# Patient Record
Sex: Male | Born: 1945 | Race: Black or African American | Hispanic: No | Marital: Married | Smoking: Former smoker
Health system: Southern US, Community
[De-identification: ages and names within clinical notes are randomized; demographics above are authoritative.]

## PROBLEM LIST (undated history)

## (undated) DIAGNOSIS — K5790 Diverticulosis of intestine, part unspecified, without perforation or abscess without bleeding: Secondary | ICD-10-CM

## (undated) DIAGNOSIS — N419 Inflammatory disease of prostate, unspecified: Secondary | ICD-10-CM

## (undated) DIAGNOSIS — R001 Bradycardia, unspecified: Secondary | ICD-10-CM

## (undated) DIAGNOSIS — I5020 Unspecified systolic (congestive) heart failure: Secondary | ICD-10-CM

## (undated) DIAGNOSIS — N400 Enlarged prostate without lower urinary tract symptoms: Secondary | ICD-10-CM

## (undated) DIAGNOSIS — M12349 Palindromic rheumatism, unspecified hand: Secondary | ICD-10-CM

## (undated) DIAGNOSIS — Z7982 Long term (current) use of aspirin: Secondary | ICD-10-CM

## (undated) DIAGNOSIS — N423 Unspecified dysplasia of prostate: Secondary | ICD-10-CM

## (undated) DIAGNOSIS — N183 Chronic kidney disease, stage 3 unspecified: Secondary | ICD-10-CM

## (undated) DIAGNOSIS — G894 Chronic pain syndrome: Secondary | ICD-10-CM

## (undated) DIAGNOSIS — D509 Iron deficiency anemia, unspecified: Secondary | ICD-10-CM

## (undated) DIAGNOSIS — G4733 Obstructive sleep apnea (adult) (pediatric): Secondary | ICD-10-CM

## (undated) DIAGNOSIS — D126 Benign neoplasm of colon, unspecified: Secondary | ICD-10-CM

## (undated) DIAGNOSIS — I517 Cardiomegaly: Secondary | ICD-10-CM

## (undated) DIAGNOSIS — E039 Hypothyroidism, unspecified: Secondary | ICD-10-CM

## (undated) DIAGNOSIS — F1190 Opioid use, unspecified, uncomplicated: Secondary | ICD-10-CM

## (undated) DIAGNOSIS — N189 Chronic kidney disease, unspecified: Secondary | ICD-10-CM

## (undated) DIAGNOSIS — I2510 Atherosclerotic heart disease of native coronary artery without angina pectoris: Secondary | ICD-10-CM

## (undated) DIAGNOSIS — E785 Hyperlipidemia, unspecified: Secondary | ICD-10-CM

## (undated) DIAGNOSIS — D649 Anemia, unspecified: Secondary | ICD-10-CM

## (undated) DIAGNOSIS — E119 Type 2 diabetes mellitus without complications: Secondary | ICD-10-CM

## (undated) DIAGNOSIS — I5189 Other ill-defined heart diseases: Secondary | ICD-10-CM

## (undated) DIAGNOSIS — K6389 Other specified diseases of intestine: Secondary | ICD-10-CM

## (undated) DIAGNOSIS — E291 Testicular hypofunction: Secondary | ICD-10-CM

## (undated) DIAGNOSIS — I428 Other cardiomyopathies: Secondary | ICD-10-CM

## (undated) DIAGNOSIS — R296 Repeated falls: Secondary | ICD-10-CM

## (undated) DIAGNOSIS — R918 Other nonspecific abnormal finding of lung field: Secondary | ICD-10-CM

## (undated) DIAGNOSIS — E663 Overweight: Secondary | ICD-10-CM

## (undated) DIAGNOSIS — Z5189 Encounter for other specified aftercare: Secondary | ICD-10-CM

## (undated) DIAGNOSIS — I700 Atherosclerosis of aorta: Secondary | ICD-10-CM

## (undated) DIAGNOSIS — I447 Left bundle-branch block, unspecified: Secondary | ICD-10-CM

## (undated) DIAGNOSIS — K6289 Other specified diseases of anus and rectum: Secondary | ICD-10-CM

## (undated) DIAGNOSIS — I5022 Chronic systolic (congestive) heart failure: Secondary | ICD-10-CM

## (undated) DIAGNOSIS — I10 Essential (primary) hypertension: Secondary | ICD-10-CM

## (undated) DIAGNOSIS — N410 Acute prostatitis: Secondary | ICD-10-CM

## (undated) DIAGNOSIS — N509 Disorder of male genital organs, unspecified: Secondary | ICD-10-CM

## (undated) DIAGNOSIS — K439 Ventral hernia without obstruction or gangrene: Secondary | ICD-10-CM

## (undated) DIAGNOSIS — M5030 Other cervical disc degeneration, unspecified cervical region: Secondary | ICD-10-CM

## (undated) DIAGNOSIS — R7989 Other specified abnormal findings of blood chemistry: Secondary | ICD-10-CM

## (undated) DIAGNOSIS — I509 Heart failure, unspecified: Secondary | ICD-10-CM

## (undated) DIAGNOSIS — M48061 Spinal stenosis, lumbar region without neurogenic claudication: Secondary | ICD-10-CM

## (undated) DIAGNOSIS — I3139 Other pericardial effusion (noninflammatory): Secondary | ICD-10-CM

## (undated) DIAGNOSIS — C9110 Chronic lymphocytic leukemia of B-cell type not having achieved remission: Secondary | ICD-10-CM

## (undated) DIAGNOSIS — N453 Epididymo-orchitis: Secondary | ICD-10-CM

## (undated) DIAGNOSIS — D3502 Benign neoplasm of left adrenal gland: Secondary | ICD-10-CM

## (undated) DIAGNOSIS — I340 Nonrheumatic mitral (valve) insufficiency: Secondary | ICD-10-CM

## (undated) DIAGNOSIS — N529 Male erectile dysfunction, unspecified: Secondary | ICD-10-CM

## (undated) DIAGNOSIS — R911 Solitary pulmonary nodule: Secondary | ICD-10-CM

## (undated) DIAGNOSIS — N50819 Testicular pain, unspecified: Secondary | ICD-10-CM

## (undated) DIAGNOSIS — I219 Acute myocardial infarction, unspecified: Secondary | ICD-10-CM

## (undated) DIAGNOSIS — N5089 Other specified disorders of the male genital organs: Secondary | ICD-10-CM

## (undated) HISTORY — DX: Disorder of male genital organs, unspecified: N50.9

## (undated) HISTORY — DX: Acute myocardial infarction, unspecified: I21.9

## (undated) HISTORY — DX: Testicular hypofunction: E29.1

## (undated) HISTORY — DX: Other specified diseases of anus and rectum: K62.89

## (undated) HISTORY — PX: BACK SURGERY: SHX140

## (undated) HISTORY — PX: OTHER SURGICAL HISTORY: SHX169

## (undated) HISTORY — DX: Other specified diseases of intestine: K63.89

## (undated) HISTORY — DX: Testicular pain, unspecified: N50.819

## (undated) HISTORY — DX: Inflammatory disease of prostate, unspecified: N41.9

## (undated) HISTORY — DX: Epididymo-orchitis: N45.3

## (undated) HISTORY — DX: Anemia, unspecified: D64.9

## (undated) HISTORY — DX: Overweight: E66.3

## (undated) HISTORY — DX: Other specified disorders of the male genital organs: N50.89

## (undated) HISTORY — DX: Other specified abnormal findings of blood chemistry: R79.89

## (undated) HISTORY — DX: Encounter for other specified aftercare: Z51.89

## (undated) HISTORY — DX: Male erectile dysfunction, unspecified: N52.9

## (undated) HISTORY — DX: Acute prostatitis: N41.0

## (undated) HISTORY — DX: Palindromic rheumatism, unspecified hand: M12.349

## (undated) HISTORY — DX: Benign prostatic hyperplasia without lower urinary tract symptoms: N40.0

## (undated) HISTORY — DX: Essential (primary) hypertension: I10

## (undated) HISTORY — DX: Type 2 diabetes mellitus without complications: E11.9

## (undated) HISTORY — PX: TONSILLECTOMY: SUR1361

## (undated) HISTORY — DX: Chronic lymphocytic leukemia of B-cell type not having achieved remission: C91.10

## (undated) HISTORY — DX: Chronic kidney disease, unspecified: N18.9

## (undated) HISTORY — PX: COLON SURGERY: SHX602

## (undated) HISTORY — DX: Atherosclerotic heart disease of native coronary artery without angina pectoris: I25.10

## (undated) HISTORY — DX: Unspecified dysplasia of prostate: N42.30

---

## 2003-01-21 ENCOUNTER — Other Ambulatory Visit: Payer: Self-pay

## 2004-01-10 ENCOUNTER — Emergency Department: Payer: Self-pay | Admitting: Emergency Medicine

## 2004-06-06 ENCOUNTER — Other Ambulatory Visit: Payer: Self-pay

## 2004-06-06 ENCOUNTER — Emergency Department: Payer: Self-pay | Admitting: Emergency Medicine

## 2004-12-17 ENCOUNTER — Emergency Department: Payer: Self-pay | Admitting: General Practice

## 2004-12-17 ENCOUNTER — Ambulatory Visit: Payer: Self-pay | Admitting: General Practice

## 2005-07-01 ENCOUNTER — Ambulatory Visit: Payer: Self-pay | Admitting: Gastroenterology

## 2005-07-26 ENCOUNTER — Emergency Department: Payer: Self-pay | Admitting: Emergency Medicine

## 2006-02-09 ENCOUNTER — Ambulatory Visit: Payer: Self-pay | Admitting: Nurse Practitioner

## 2006-06-03 ENCOUNTER — Inpatient Hospital Stay: Payer: Self-pay | Admitting: Vascular Surgery

## 2006-11-28 ENCOUNTER — Emergency Department: Payer: Self-pay | Admitting: Emergency Medicine

## 2008-02-23 ENCOUNTER — Emergency Department: Payer: Self-pay | Admitting: Unknown Physician Specialty

## 2008-12-23 ENCOUNTER — Emergency Department: Payer: Self-pay | Admitting: Emergency Medicine

## 2009-04-10 ENCOUNTER — Encounter: Payer: Self-pay | Admitting: Family Medicine

## 2009-04-22 ENCOUNTER — Emergency Department: Payer: Self-pay | Admitting: Unknown Physician Specialty

## 2009-04-25 ENCOUNTER — Encounter: Payer: Self-pay | Admitting: Family Medicine

## 2009-09-17 ENCOUNTER — Inpatient Hospital Stay: Payer: Self-pay | Admitting: Internal Medicine

## 2009-09-22 ENCOUNTER — Ambulatory Visit: Payer: Self-pay | Admitting: Cardiovascular Disease

## 2011-05-02 ENCOUNTER — Emergency Department: Payer: Self-pay | Admitting: Emergency Medicine

## 2011-06-30 ENCOUNTER — Emergency Department: Payer: Self-pay | Admitting: Emergency Medicine

## 2011-06-30 LAB — COMPREHENSIVE METABOLIC PANEL
Albumin: 3.4 g/dL (ref 3.4–5.0)
Alkaline Phosphatase: 65 U/L (ref 50–136)
Anion Gap: 9 (ref 7–16)
Bilirubin,Total: 0.6 mg/dL (ref 0.2–1.0)
Calcium, Total: 8.4 mg/dL — ABNORMAL LOW (ref 8.5–10.1)
Chloride: 108 mmol/L — ABNORMAL HIGH (ref 98–107)
Co2: 27 mmol/L (ref 21–32)
Creatinine: 1.11 mg/dL (ref 0.60–1.30)
Osmolality: 289 (ref 275–301)
SGOT(AST): 36 U/L (ref 15–37)
SGPT (ALT): 26 U/L
Sodium: 144 mmol/L (ref 136–145)

## 2011-06-30 LAB — URINALYSIS, COMPLETE
Bacteria: NONE SEEN
Bilirubin,UR: NEGATIVE
Blood: NEGATIVE
Glucose,UR: NEGATIVE mg/dL (ref 0–75)
Ketone: NEGATIVE
Ph: 5 (ref 4.5–8.0)
RBC,UR: NONE SEEN /HPF (ref 0–5)
Squamous Epithelial: <1

## 2011-06-30 LAB — LIPASE, BLOOD: Lipase: 236 U/L (ref 73–393)

## 2011-06-30 LAB — CBC
MCH: 29.6 pg (ref 26.0–34.0)
Platelet: 185 10*3/uL (ref 150–440)
RBC: 3.98 10*6/uL — ABNORMAL LOW (ref 4.40–5.90)

## 2011-09-27 ENCOUNTER — Emergency Department: Payer: Self-pay | Admitting: Emergency Medicine

## 2011-09-27 LAB — COMPREHENSIVE METABOLIC PANEL WITH GFR
Albumin: 3.5 g/dL (ref 3.4–5.0)
Alkaline Phosphatase: 75 U/L (ref 50–136)
Anion Gap: 9 (ref 7–16)
BUN: 18 mg/dL (ref 7–18)
Bilirubin,Total: 1.5 mg/dL — ABNORMAL HIGH (ref 0.2–1.0)
Calcium, Total: 8.9 mg/dL (ref 8.5–10.1)
Chloride: 106 mmol/L (ref 98–107)
Co2: 22 mmol/L (ref 21–32)
Creatinine: 1.14 mg/dL (ref 0.60–1.30)
EGFR (African American): >60
EGFR (Non-African Amer.): >60
Glucose: 182 mg/dL — ABNORMAL HIGH (ref 65–99)
Osmolality: 280 (ref 275–301)
Potassium: 3.4 mmol/L — ABNORMAL LOW (ref 3.5–5.1)
SGOT(AST): 29 U/L (ref 15–37)
SGPT (ALT): 23 U/L (ref 12–78)
Sodium: 137 mmol/L (ref 136–145)
Total Protein: 7.7 g/dL (ref 6.4–8.2)

## 2011-09-27 LAB — CSF CELL COUNT WITH DIFFERENTIAL
Eosinophil: 0 %
Lymphocytes: 8 %
Monocytes/Macrophages: 92 %
Neutrophils: 0 %
Other Cells: 0 %
WBC (CSF): 7 /mm3

## 2011-09-27 LAB — CBC WITH DIFFERENTIAL/PLATELET
Basophil #: 0 10*3/uL (ref 0.0–0.1)
Basophil %: 0.3 %
Eosinophil %: 0.1 %
HCT: 37.5 % — ABNORMAL LOW (ref 40.0–52.0)
Lymphocyte #: 0.7 10*3/uL — ABNORMAL LOW (ref 1.0–3.6)
Lymphocyte %: 4.9 %
MCH: 29.1 pg (ref 26.0–34.0)
MCV: 88 fL (ref 80–100)
Monocyte #: 1.1 x10 3/mm — ABNORMAL HIGH (ref 0.2–1.0)
Monocyte %: 7.7 %
Neutrophil #: 11.9 10*3/uL — ABNORMAL HIGH (ref 1.4–6.5)
Platelet: 166 10*3/uL (ref 150–440)
RDW: 15 % — ABNORMAL HIGH (ref 11.5–14.5)
WBC: 13.1 10*3/uL — ABNORMAL HIGH (ref 3.8–10.6)

## 2011-09-27 LAB — TSH: Thyroid Stimulating Horm: 7.19 u[IU]/mL — ABNORMAL HIGH

## 2011-09-27 LAB — PROTEIN, CSF: Protein, CSF: 53 mg/dL — ABNORMAL HIGH (ref 15–45)

## 2011-09-27 LAB — GLUCOSE, CSF: Glucose, CSF: 93 mg/dL — ABNORMAL HIGH (ref 40–75)

## 2011-09-28 LAB — URINE CULTURE

## 2011-10-02 LAB — CULTURE, BLOOD (SINGLE)

## 2012-02-25 ENCOUNTER — Emergency Department: Payer: Self-pay | Admitting: Emergency Medicine

## 2012-02-25 LAB — COMPREHENSIVE METABOLIC PANEL
Albumin: 4.4 g/dL (ref 3.4–5.0)
Calcium, Total: 9.3 mg/dL (ref 8.5–10.1)
Co2: 32 mmol/L (ref 21–32)
EGFR (African American): >60
EGFR (Non-African Amer.): >60
Potassium: 4.2 mmol/L (ref 3.5–5.1)
SGOT(AST): 46 U/L — ABNORMAL HIGH (ref 15–37)
Sodium: 141 mmol/L (ref 136–145)
Total Protein: 8.3 g/dL — ABNORMAL HIGH (ref 6.4–8.2)

## 2012-02-25 LAB — CBC
HCT: 39.8 % — ABNORMAL LOW (ref 40.0–52.0)
HGB: 13.2 g/dL (ref 13.0–18.0)
MCH: 29.9 pg (ref 26.0–34.0)
RBC: 4.42 10*6/uL (ref 4.40–5.90)
WBC: 5.7 10*3/uL (ref 3.8–10.6)

## 2012-06-30 ENCOUNTER — Emergency Department: Payer: Self-pay | Admitting: Emergency Medicine

## 2012-08-14 ENCOUNTER — Ambulatory Visit: Payer: Self-pay | Admitting: Internal Medicine

## 2013-03-14 ENCOUNTER — Ambulatory Visit: Payer: Self-pay | Admitting: Specialist

## 2013-06-16 ENCOUNTER — Inpatient Hospital Stay: Payer: Self-pay | Admitting: Neurology

## 2013-06-16 DIAGNOSIS — N453 Epididymo-orchitis: Secondary | ICD-10-CM

## 2013-06-16 HISTORY — DX: Epididymo-orchitis: N45.3

## 2013-06-16 LAB — URINALYSIS, COMPLETE
Bacteria: NONE SEEN
Bilirubin,UR: NEGATIVE
Glucose,UR: NEGATIVE mg/dL (ref 0–75)
Ketone: NEGATIVE
LEUKOCYTE ESTERASE: NEGATIVE
Nitrite: NEGATIVE
Ph: 6 (ref 4.5–8.0)
Protein: NEGATIVE
RBC,UR: 5 /HPF (ref 0–5)
Specific Gravity: 1.019 (ref 1.003–1.030)
Squamous Epithelial: <1

## 2013-06-16 LAB — CBC WITH DIFFERENTIAL/PLATELET
Basophil #: 0 10*3/uL (ref 0.0–0.1)
Basophil %: 0.3 %
EOS ABS: 0 10*3/uL (ref 0.0–0.7)
Eosinophil %: 0.1 %
HCT: 40.9 % (ref 40.0–52.0)
HGB: 13.4 g/dL (ref 13.0–18.0)
Lymphocyte #: 1.4 10*3/uL (ref 1.0–3.6)
Lymphocyte %: 8.7 %
MCH: 29.8 pg (ref 26.0–34.0)
MCHC: 32.8 g/dL (ref 32.0–36.0)
MCV: 91 fL (ref 80–100)
Monocyte #: 0.9 x10 3/mm (ref 0.2–1.0)
Monocyte %: 5.7 %
Neutrophil #: 13.4 10*3/uL — ABNORMAL HIGH (ref 1.4–6.5)
Neutrophil %: 85.2 %
Platelet: 191 10*3/uL (ref 150–440)
RBC: 4.49 10*6/uL (ref 4.40–5.90)
RDW: 14.5 % (ref 11.5–14.5)
WBC: 15.7 10*3/uL — AB (ref 3.8–10.6)

## 2013-06-16 LAB — COMPREHENSIVE METABOLIC PANEL
ALBUMIN: 3.6 g/dL (ref 3.4–5.0)
ALT: 29 U/L (ref 12–78)
Alkaline Phosphatase: 74 U/L
Anion Gap: 4 — ABNORMAL LOW (ref 7–16)
BUN: 15 mg/dL (ref 7–18)
Bilirubin,Total: 1.5 mg/dL — ABNORMAL HIGH (ref 0.2–1.0)
CREATININE: 1.36 mg/dL — AB (ref 0.60–1.30)
Calcium, Total: 8.8 mg/dL (ref 8.5–10.1)
Chloride: 105 mmol/L (ref 98–107)
Co2: 28 mmol/L (ref 21–32)
EGFR (Non-African Amer.): 53 — ABNORMAL LOW
GLUCOSE: 118 mg/dL — AB (ref 65–99)
Osmolality: 276 (ref 275–301)
Potassium: 3.8 mmol/L (ref 3.5–5.1)
SGOT(AST): 40 U/L — ABNORMAL HIGH (ref 15–37)
SODIUM: 137 mmol/L (ref 136–145)
Total Protein: 7.6 g/dL (ref 6.4–8.2)

## 2013-06-16 LAB — CK: CK, Total: 599 U/L — ABNORMAL HIGH

## 2013-06-18 LAB — URINE CULTURE

## 2013-06-21 LAB — CULTURE, BLOOD (SINGLE)

## 2013-08-26 ENCOUNTER — Emergency Department: Payer: Self-pay | Admitting: Emergency Medicine

## 2013-08-31 ENCOUNTER — Ambulatory Visit: Payer: Self-pay | Admitting: Internal Medicine

## 2013-09-30 ENCOUNTER — Emergency Department: Payer: Self-pay | Admitting: Emergency Medicine

## 2013-10-06 ENCOUNTER — Emergency Department: Payer: Self-pay | Admitting: Emergency Medicine

## 2013-10-06 LAB — COMPREHENSIVE METABOLIC PANEL
ALBUMIN: 3.3 g/dL — AB (ref 3.4–5.0)
Alkaline Phosphatase: 70 U/L
Anion Gap: 9 (ref 7–16)
BILIRUBIN TOTAL: 0.8 mg/dL (ref 0.2–1.0)
BUN: 27 mg/dL — AB (ref 7–18)
CHLORIDE: 102 mmol/L (ref 98–107)
CREATININE: 1.37 mg/dL — AB (ref 0.60–1.30)
Calcium, Total: 9.1 mg/dL (ref 8.5–10.1)
Co2: 28 mmol/L (ref 21–32)
EGFR (African American): >60
GFR CALC NON AF AMER: 53 — AB
Glucose: 206 mg/dL — ABNORMAL HIGH (ref 65–99)
Osmolality: 289 (ref 275–301)
Potassium: 4.1 mmol/L (ref 3.5–5.1)
SGOT(AST): 32 U/L (ref 15–37)
SGPT (ALT): 22 U/L
SODIUM: 139 mmol/L (ref 136–145)
TOTAL PROTEIN: 7.9 g/dL (ref 6.4–8.2)

## 2013-10-06 LAB — CBC WITH DIFFERENTIAL/PLATELET
BASOS ABS: 0.1 10*3/uL (ref 0.0–0.1)
Basophil %: 1.1 %
EOS ABS: 0.4 10*3/uL (ref 0.0–0.7)
Eosinophil %: 6.2 %
HCT: 37.3 % — ABNORMAL LOW (ref 40.0–52.0)
HGB: 11.8 g/dL — AB (ref 13.0–18.0)
Lymphocyte #: 2.3 10*3/uL (ref 1.0–3.6)
Lymphocyte %: 35.9 %
MCH: 28.9 pg (ref 26.0–34.0)
MCHC: 31.5 g/dL — AB (ref 32.0–36.0)
MCV: 92 fL (ref 80–100)
MONOS PCT: 6.5 %
Monocyte #: 0.4 x10 3/mm (ref 0.2–1.0)
Neutrophil #: 3.3 10*3/uL (ref 1.4–6.5)
Neutrophil %: 50.3 %
PLATELETS: 295 10*3/uL (ref 150–440)
RBC: 4.08 10*6/uL — ABNORMAL LOW (ref 4.40–5.90)
RDW: 14.2 % (ref 11.5–14.5)
WBC: 6.5 10*3/uL (ref 3.8–10.6)

## 2013-10-09 ENCOUNTER — Emergency Department: Payer: Self-pay | Admitting: Emergency Medicine

## 2013-10-10 ENCOUNTER — Emergency Department: Payer: Self-pay | Admitting: Student

## 2014-01-10 ENCOUNTER — Ambulatory Visit: Payer: Self-pay | Admitting: Specialist

## 2014-01-14 ENCOUNTER — Ambulatory Visit: Payer: Self-pay | Admitting: Specialist

## 2014-01-22 ENCOUNTER — Emergency Department: Payer: Self-pay | Admitting: Emergency Medicine

## 2014-01-29 DIAGNOSIS — M4806 Spinal stenosis, lumbar region: Secondary | ICD-10-CM | POA: Diagnosis not present

## 2014-03-07 DIAGNOSIS — E663 Overweight: Secondary | ICD-10-CM | POA: Diagnosis not present

## 2014-03-07 DIAGNOSIS — N419 Inflammatory disease of prostate, unspecified: Secondary | ICD-10-CM | POA: Diagnosis not present

## 2014-03-07 DIAGNOSIS — K6289 Other specified diseases of anus and rectum: Secondary | ICD-10-CM | POA: Diagnosis not present

## 2014-03-07 DIAGNOSIS — N400 Enlarged prostate without lower urinary tract symptoms: Secondary | ICD-10-CM | POA: Diagnosis not present

## 2014-03-07 DIAGNOSIS — E291 Testicular hypofunction: Secondary | ICD-10-CM | POA: Diagnosis not present

## 2014-03-14 DIAGNOSIS — M25519 Pain in unspecified shoulder: Secondary | ICD-10-CM | POA: Diagnosis not present

## 2014-03-14 DIAGNOSIS — M7550 Bursitis of unspecified shoulder: Secondary | ICD-10-CM | POA: Diagnosis not present

## 2014-03-20 DIAGNOSIS — J329 Chronic sinusitis, unspecified: Secondary | ICD-10-CM | POA: Diagnosis not present

## 2014-03-20 DIAGNOSIS — R42 Dizziness and giddiness: Secondary | ICD-10-CM | POA: Diagnosis not present

## 2014-03-20 DIAGNOSIS — Z1329 Encounter for screening for other suspected endocrine disorder: Secondary | ICD-10-CM | POA: Diagnosis not present

## 2014-03-20 DIAGNOSIS — Z130 Encounter for screening for diseases of the blood and blood-forming organs and certain disorders involving the immune mechanism: Secondary | ICD-10-CM | POA: Diagnosis not present

## 2014-03-26 DIAGNOSIS — D649 Anemia, unspecified: Secondary | ICD-10-CM | POA: Diagnosis not present

## 2014-04-04 DIAGNOSIS — G4730 Sleep apnea, unspecified: Secondary | ICD-10-CM | POA: Insufficient documentation

## 2014-04-05 DIAGNOSIS — E663 Overweight: Secondary | ICD-10-CM | POA: Diagnosis not present

## 2014-04-05 DIAGNOSIS — N419 Inflammatory disease of prostate, unspecified: Secondary | ICD-10-CM | POA: Diagnosis not present

## 2014-04-05 DIAGNOSIS — N529 Male erectile dysfunction, unspecified: Secondary | ICD-10-CM | POA: Diagnosis not present

## 2014-04-05 DIAGNOSIS — E291 Testicular hypofunction: Secondary | ICD-10-CM | POA: Diagnosis not present

## 2014-04-05 DIAGNOSIS — N400 Enlarged prostate without lower urinary tract symptoms: Secondary | ICD-10-CM | POA: Diagnosis not present

## 2014-04-10 DIAGNOSIS — R1110 Vomiting, unspecified: Secondary | ICD-10-CM | POA: Diagnosis not present

## 2014-04-10 DIAGNOSIS — Z87891 Personal history of nicotine dependence: Secondary | ICD-10-CM | POA: Diagnosis not present

## 2014-04-10 DIAGNOSIS — Z1211 Encounter for screening for malignant neoplasm of colon: Secondary | ICD-10-CM | POA: Diagnosis not present

## 2014-04-10 DIAGNOSIS — I10 Essential (primary) hypertension: Secondary | ICD-10-CM | POA: Diagnosis not present

## 2014-04-10 DIAGNOSIS — R1013 Epigastric pain: Secondary | ICD-10-CM | POA: Diagnosis not present

## 2014-04-10 DIAGNOSIS — K5730 Diverticulosis of large intestine without perforation or abscess without bleeding: Secondary | ICD-10-CM | POA: Diagnosis not present

## 2014-04-10 DIAGNOSIS — D123 Benign neoplasm of transverse colon: Secondary | ICD-10-CM | POA: Diagnosis not present

## 2014-04-10 DIAGNOSIS — R9431 Abnormal electrocardiogram [ECG] [EKG]: Secondary | ICD-10-CM | POA: Diagnosis not present

## 2014-04-10 DIAGNOSIS — I440 Atrioventricular block, first degree: Secondary | ICD-10-CM | POA: Diagnosis not present

## 2014-04-10 DIAGNOSIS — E039 Hypothyroidism, unspecified: Secondary | ICD-10-CM | POA: Diagnosis not present

## 2014-04-10 DIAGNOSIS — J690 Pneumonitis due to inhalation of food and vomit: Secondary | ICD-10-CM | POA: Diagnosis not present

## 2014-04-10 DIAGNOSIS — Z79899 Other long term (current) drug therapy: Secondary | ICD-10-CM | POA: Diagnosis not present

## 2014-04-10 DIAGNOSIS — D122 Benign neoplasm of ascending colon: Secondary | ICD-10-CM | POA: Diagnosis not present

## 2014-04-10 DIAGNOSIS — I447 Left bundle-branch block, unspecified: Secondary | ICD-10-CM | POA: Diagnosis not present

## 2014-04-10 DIAGNOSIS — D649 Anemia, unspecified: Secondary | ICD-10-CM | POA: Diagnosis not present

## 2014-04-10 DIAGNOSIS — R0902 Hypoxemia: Secondary | ICD-10-CM | POA: Diagnosis not present

## 2014-04-10 DIAGNOSIS — E119 Type 2 diabetes mellitus without complications: Secondary | ICD-10-CM | POA: Diagnosis not present

## 2014-04-10 DIAGNOSIS — G8929 Other chronic pain: Secondary | ICD-10-CM | POA: Diagnosis not present

## 2014-04-10 DIAGNOSIS — K621 Rectal polyp: Secondary | ICD-10-CM | POA: Diagnosis not present

## 2014-04-10 DIAGNOSIS — R001 Bradycardia, unspecified: Secondary | ICD-10-CM | POA: Diagnosis not present

## 2014-04-10 DIAGNOSIS — M549 Dorsalgia, unspecified: Secondary | ICD-10-CM | POA: Diagnosis not present

## 2014-04-11 DIAGNOSIS — G4733 Obstructive sleep apnea (adult) (pediatric): Secondary | ICD-10-CM | POA: Diagnosis not present

## 2014-04-11 DIAGNOSIS — I440 Atrioventricular block, first degree: Secondary | ICD-10-CM | POA: Diagnosis not present

## 2014-04-11 DIAGNOSIS — Z79899 Other long term (current) drug therapy: Secondary | ICD-10-CM | POA: Diagnosis not present

## 2014-04-11 DIAGNOSIS — K621 Rectal polyp: Secondary | ICD-10-CM | POA: Diagnosis not present

## 2014-04-11 DIAGNOSIS — E039 Hypothyroidism, unspecified: Secondary | ICD-10-CM | POA: Diagnosis not present

## 2014-04-11 DIAGNOSIS — R9431 Abnormal electrocardiogram [ECG] [EKG]: Secondary | ICD-10-CM | POA: Diagnosis not present

## 2014-04-11 DIAGNOSIS — J690 Pneumonitis due to inhalation of food and vomit: Secondary | ICD-10-CM | POA: Diagnosis not present

## 2014-04-11 DIAGNOSIS — G8929 Other chronic pain: Secondary | ICD-10-CM | POA: Diagnosis not present

## 2014-04-11 DIAGNOSIS — I447 Left bundle-branch block, unspecified: Secondary | ICD-10-CM | POA: Diagnosis not present

## 2014-04-11 DIAGNOSIS — R1013 Epigastric pain: Secondary | ICD-10-CM | POA: Diagnosis not present

## 2014-04-11 DIAGNOSIS — M549 Dorsalgia, unspecified: Secondary | ICD-10-CM | POA: Diagnosis not present

## 2014-04-11 DIAGNOSIS — R0902 Hypoxemia: Secondary | ICD-10-CM | POA: Diagnosis not present

## 2014-04-11 DIAGNOSIS — D122 Benign neoplasm of ascending colon: Secondary | ICD-10-CM | POA: Diagnosis not present

## 2014-04-11 DIAGNOSIS — I10 Essential (primary) hypertension: Secondary | ICD-10-CM | POA: Diagnosis not present

## 2014-04-11 DIAGNOSIS — Z87891 Personal history of nicotine dependence: Secondary | ICD-10-CM | POA: Diagnosis not present

## 2014-04-11 DIAGNOSIS — R1110 Vomiting, unspecified: Secondary | ICD-10-CM | POA: Diagnosis not present

## 2014-04-11 DIAGNOSIS — Z1211 Encounter for screening for malignant neoplasm of colon: Secondary | ICD-10-CM | POA: Diagnosis not present

## 2014-04-11 DIAGNOSIS — K5730 Diverticulosis of large intestine without perforation or abscess without bleeding: Secondary | ICD-10-CM | POA: Diagnosis not present

## 2014-04-11 DIAGNOSIS — D649 Anemia, unspecified: Secondary | ICD-10-CM | POA: Diagnosis not present

## 2014-04-11 DIAGNOSIS — E119 Type 2 diabetes mellitus without complications: Secondary | ICD-10-CM | POA: Diagnosis not present

## 2014-05-10 DIAGNOSIS — S43429A Sprain of unspecified rotator cuff capsule, initial encounter: Secondary | ICD-10-CM | POA: Diagnosis not present

## 2014-05-10 DIAGNOSIS — M549 Dorsalgia, unspecified: Secondary | ICD-10-CM | POA: Diagnosis not present

## 2014-05-10 DIAGNOSIS — I10 Essential (primary) hypertension: Secondary | ICD-10-CM | POA: Diagnosis not present

## 2014-05-17 DIAGNOSIS — M7541 Impingement syndrome of right shoulder: Secondary | ICD-10-CM | POA: Diagnosis not present

## 2014-05-17 DIAGNOSIS — G5602 Carpal tunnel syndrome, left upper limb: Secondary | ICD-10-CM | POA: Diagnosis not present

## 2014-05-18 NOTE — Discharge Summary (Signed)
PATIENT NAME:  Paul Briggs, Paul Briggs MR#:  [id number] DATE OF BIRTH:  1945/06/13  DATE OF ADMISSION:  06/16/2013 DATE OF DISCHARGE:  06/19/2013  DISCHARGE DIAGNOSES:  1.  Sepsis present on admission, likely due to orchitis/epididymitis and left lower extremity cellulitis. 2.  Acute kidney failure.  3.  Hypertension.  4.  Possible tick bite injury.   SECONDARY DIAGNOSES: 1.  Diabetes.  2.  Hypertension.  3.  Hypothyroidism.   CONSULTATIONS: Infectious disease, Dr. Adrian Prows.   PROCEDURES AND RADIOLOGY: Ultrasound of the testicle on admission showed left-sided varicocele, bilateral orchitis and left epididymitis. Moderate bilateral hydrocele.   Left lower extremity Doppler on admission was negative.   LABORATORY DATA: UA on admission was negative.  Blood cultures x2 and urine culture was negative.   HISTORY AND SHORT HOSPITAL COURSE: The patient is a 69 year old male with above mentioned medical problems who was admitted for weakness and was found to have sepsis likely due to cellulitis and/or orchitis/epididymitis. Please Dr. Ardith Dark dictated history and physical for further details. The patient was continued on broad-spectrum antibiotic, but continued to spike fever for which infectious disease consultation was obtained. His fever curve was improving. After discussion with Dr. Adrian Prows from infectious disease, the patient was switched over to oral Augmentin and doxycycline, as the fever curve was improving and cellulitis improved. The patient was discharged home on the 26th of May in stable condition.   PERTINENT DISCHARGE PHYSICAL EXAMINATION: VITAL SIGNS: On the day of discharge, temperature was 98.4, heart rate 67 per minute, respirations 18 per minute, blood pressure 156/83 mmHg, and he was saturating 95% on room air.  CARDIOVASCULAR: S1, S2 normal. No murmurs, rubs, or gallop.  LUNGS: Clear to auscultation bilaterally. No wheezing, rales, rhonchi, or crepitation.   ABDOMEN: Soft, benign.  NEUROLOGIC: Nonfocal examination. EXTREMITIES: Left lower extremity cellulitis was cleared up. All of all physical examination remained at baseline.   DISCHARGE MEDICATIONS: 1.  Acetaminophen/oxycodone 325/5 mg 1 tablet p.o. every 6 hours as needed.  2.  Diazepam 2 mg p.o. 3 times a day as needed.  3.  Diphenhydramine 25 mg p.o. every 6 hours as needed.  4.  Amlodipine 10 mg p.o. daily. 5.  Doxycycline 100 mg p.o. b.i.d. for 5 days.  6.  Augmentin 875/125 mg 1 tablet p.o. b.i.d. for 10 days.   DISCHARGE DIET: Low sodium.   DISCHARGE ACTIVITY: As tolerated.   DISCHARGE INSTRUCTIONS AND FOLLOW-UP: The patient was instructed to follow up with his primary care physician, Dr. Delight Stare in 4 to 6 weeks, with [facility] urology in 2 to 4 weeks, and with Dr. Adrian Prows for infectious disease in 1 to 2 weeks. He was  instructed to elevate his legs.   TOTAL TIME DISCHARGING THIS PATIENT: 45 minutes.   ____________________________ Lucina Mellow. Manuella Ghazi, MD vss:sb D: 06/22/2013 10:25:25 ET T: 06/22/2013 10:51:59 ET JOB#: [id number]  cc: Lajoyce Tamura S. Manuella Ghazi, MD, <Dictator> Marguerita Merles, MD Cheral Marker. Ola Spurr, [facility] Mickal Meno [facility]un[facility] MD ELECTRONICALLY SIGNED 06/22/2013 11:15

## 2014-05-18 NOTE — H&P (Signed)
PATIENT NAME:  Paul Briggs, Paul Briggs MR#:  [id number] DATE OF BIRTH:  1945/06/10  DATE OF ADMISSION:  06/16/2013  REFERRING PHYSICIAN:  Dr. Jasmine December.   PRIMARY CARE PHYSICIAN:  Dr. Lennox Grumbles.   CHIEF COMPLAINT:  Weakness.  HISTORY OF PRESENT ILLNESS:  A 69 year old African American gentleman with history of diabetes, hypertension, hypothyroidism, presenting with weakness.  He is unfortunately not able to provide any further information given medical condition.  History obtained from wife at bedside.  She described a three day duration of progressively worsening fatigue, weakness which is generalized, originally diagnosed with prostatitis about three days ago, started on Cipro by PCP, then switched to doxycycline for unclear reasons, now having one day duration of somnolence, thus they decided to present to [facility] for further work-up and evaluation.  She states that he has been having subjective fevers and chills for the last 1 to 2 days.  She also denotes him having left lower extremity rash which is described mainly as redness, tender to palpation and increased warmth.  No further symptoms.  Once again, the patient is unable to provide any symptoms given current mental status and medical condition.   REVIEW OF SYSTEMS:  Unobtainable given the patient's mental status and medical condition.   PAST MEDICAL HISTORY:  Diabetes non-insulin-requiring, hypertension, hypothyroidism.   SOCIAL HISTORY:  Remote tobacco use.  Denies any alcohol or drug usage.   FAMILY HISTORY:  History of hypertension.   ALLERGIES:  No known drug allergies.   HOME MEDICATIONS:  Include temazepam 2 mg by mouth 3 times daily.   PHYSICAL EXAMINATION: VITAL SIGNS:  Temperature 103.2, heart rate 111, respirations 16, blood pressure 186/81, temperature 96% on room air.  Weight 108 kg, BMI of 30.6.  GENERAL:  Ill-appearing African American gentleman, is somnolent.  HEAD:  Normocephalic, atraumatic.  EYES:  Pupils equal,  round, reactive to light.  Extraocular muscles intact.  No scleral icterus.  MOUTH:  Dry mucosal membrane.  Dentition intact.  No abscess noted.  EAR, NOSE, THROAT:  Throat clear without exudates.  No external lesions.  NECK:  Supple.  No thyromegaly.  No nodules.  No JVD.  PULMONARY:  Clear to auscultation bilaterally without wheezes, rubs or rhonchi.  No use of accessory muscles.  Good respiratory effort.  CHEST:  Nontender to palpation.  CARDIOVASCULAR:  S1, S2, tachycardic.  No murmurs, rubs or gallops.  No edema.  Pedal pulses 2+ bilaterally.  GASTROINTESTINAL:  Soft, nontender, nondistended.  No masses.  Positive bowel sounds.  No hepatosplenomegaly.  MUSCULOSKELETAL:  The anterior aspect of the left tibia, area of erythema and warmth which is tender to palpation.  Otherwise, full range of motion.  No further swelling, clubbing, or edema.  NEUROLOGICAL:  Unable to fully assess given the patient's current mental status.  He is able to follow simple commands on occasion.  SKIN:  No ulcerations, lesions, rash or cyanosis other than the erythema as described above.  Skin warm and dry.  Turgor intact.  PSYCHIATRIC:  Unable to fully assess given the patient's current mental status and medical condition.  He is somnolent, though easily awakes to verbal stimuli and he is cooperative on examination, able to follow simple commands, though immediately falls back asleep when he is not being actively stimulated.   LABORATORY DATA:  Sodium 137, potassium 3.8, chloride 105, bicarb 28, BUN 15, creatinine 1.36, glucose 118.  LFTs:  Bilirubin 1.2, AST 40, otherwise within normal limits.  CK of 599.  WBC of 15.7,  hemoglobin 13.4, platelets of 191.  Urinalysis negative for evidence of infection.  He had a chest x-ray performed which reveals no acute cardiopulmonary process.  Testicular ultrasound revealing bilateral orchitis on the left with epididymitis as well as a left variceal.  Left lower extremity Doppler  performed revealing no evidence of DVT.   ASSESSMENT AND PLAN:  A 69 year old African American gentleman with history of diabetes, hypertension, presenting with weakness, originally diagnosed with prostatitis about three days ago by primary care physician, on Cipro, then switched to doxycycline, presenting with worsening of weakness and fatigue.  1.  Sepsis, meeting septic criteria by heart rate, leukocytosis and temperature, noted orchitis, epididymitis on work-up, but does have an area of erythema on the left lower extremity concerning for cellulitis.  Panculture including blood and urine.  Given his current temperature I suspect that he may actually be bacteremic.  We will culture blood and urine.  Antibiotic coverage with vancomycin and cefepime.  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  We will also continue the cefepime for broad gram-negative coverage.  Follow culture data and downgrade antibiotics when cultures return.  Continue with intravenous fluid hydration to keep mean arterial pressure greater than 65.  2.  Acute kidney injury.  Intravenous fluid hydration with normal saline.  Follow urine output, renal function.  3.  Diabetes.  Hold by mouth agents.  Add insulin sliding scale with q. 6 hour Accu-Cheks with goal blood glucose between 120 and 180 in critical care setting.  4.  Hypertension.  Add as needed hydralazine 10 mg intravenous q. 4 hours as needed for blood pressure greater than 299 or diastolic greater than 371.  5.  Venous thromboembolism prophylaxis with heparin subQ.  6.  CODE STATUS:  THE PATIENT IS FULL CODE.   TIME SPENT:  45 minutes.     ____________________________ Aaron Mose. Kirsta Probert, MD dkh:ea D: 06/16/2013 23:48:34 ET T: 06/17/2013 04:10:46 ET JOB#: [id number]  cc: Aaron Mose. Bing Duffey, MD, <Dictator> Sojourner Behringer Woodfin Ganja MD ELECTRONICALLY SIGNED 06/17/2013 20:31

## 2014-05-18 NOTE — Discharge Summary (Signed)
Dates of Admission and Diagnosis:  Date of Admission 16-Jun-2013   Date of Discharge 19-Jun-2013   Admitting Diagnosis sepsis   Final Diagnosis Sepsis: present on admission: likely due to orchitis/ epididymitis mild left lower extremity cellulitis.   Discharge Diagnosis 1 Acute kidney injury: likely prerenal.   2 Hypertension: improving.   3 possible tick bite: empiric doxy for 5 days    Chief Complaint/History of Present Illness weakness   Allergies:  No Known Allergies:   Hospital Course:  Condition on Discharge Good   Code Status:  Code Status Full Code   PHYSICAL EXAM ON DISCHARGE:  Physical Exam:  GEN no acute distress   HEENT pink conjunctivae   NECK supple  No masses   RESP normal resp effort  clear BS   CARD regular rate  no murmur   VASCULAR ACCESS -- Purulent drainage   ABD soft  normal BS   EXTR negative edema   SKIN No ulcers   NEURO follows commands   PSYCH alert, A+O to time, place, person   DISCHARGE INSTRUCTIONS HOME MEDS:  Medication Reconciliation: Patient's Home Medications at Discharge:     Medication Instructions  acetaminophen-oxycodone 325 mg-5 mg oral tablet  1 tab(s) orally every 6 hours, As Needed - for Pain   diphenhydramine 25 mg oral tablet  1 tab(s) orally every 6 hours, As Needed, itching, allergic reaction   amlodipine 10 mg oral tablet  1 tab(s) orally once a day   doxycycline hyclate 100 mg oral delayed release tablet  1 tab(s) orally 2 times a day x 5 days   amoxicillin-clavulanate 875 mg-125 mg oral tablet  1 tab(s) orally every 12 hours    PRESCRIPTIONS: ELECTRONICALLY SUBMITTED   Physician's Instructions:  Diet Low Sodium   Activity Limitations As tolerated   Return to Work Not Applicable   Time frame for Follow Up Appointment 2-4 weeks  [facility]   Time frame for Follow Up Appointment 4-6 weeks  dr miles   Time frame for Follow Up Appointment 1-2 weeks  dr fitzgerald   Other Comments  elevate legs especially in beds when sleeping.     Adrian Prows Patrick(Consultant): [facility], Internal Medicine, [address] [place], [place] [address], [place]   Delight Stare M(Family Physician): [facility], [address] [place], [place] [address], [place] [phone]  Electronic Signatures: Remer Macho (MD)  (Signed 28-May-15 10:06)  Authored: ADMISSION DATE AND DIAGNOSIS, CHIEF COMPLAINT/HPI, Allergies, HOSPITAL COURSE, PHYSICAL EXAM ON DISCHARGE, [place], PATIENT INSTRUCTIONS, Follow Up Physician   Last Updated: 28-May-15 10:06 by Remer Macho (MD)

## 2014-05-18 NOTE — Op Note (Signed)
PATIENT NAME:  Paul Briggs, Paul Briggs MR#:  [id number] DATE OF BIRTH:  04-Mar-1945  DATE OF PROCEDURE:  01/14/2014  PREOPERATIVE DIAGNOSES:   1.  Left carpal tunnel syndrome.  2.  Large mass in the palm volarly over the second and third metacarpal heads.   POSTOPERATIVE DIAGNOSES:   1.  Left carpal tunnel syndrome.  2.  Large mass in the palm volarly over the second and third metacarpal heads.   OPERATIONS:  1.  Left carpal tunnel release.  2.  Excision of large deep mass (3 x 3 cm) from the volar aspect of the left palm over the second and third metacarpal head.   SURGEON: Park Breed, MD.   ANESTHESIA: General LMA.   COMPLICATIONS: None.   DRAINS: None.   ESTIMATED BLOOD LOSS: None.   REPLACED: None.   OPERATIVE PROCEDURE: The patient was brought to the operating room where he underwent satisfactory general LMA anesthesia in the supine position. The left hand and arm were prepped and draped in sterile fashion. Esmarch was applied and the tourniquet inflated to 250 mmHg. Tourniquet time was 41 minutes. A longitudinal incision was made in the palm over the carpal tunnel and dissection carried out bluntly through subcutaneous tissue. The distal aspect of the volar carpal ligament was identified and a Kelly clamp passed beneath it to protect the nerve. The Mini-Blade knife was used to release the distal 80% of the volar retinacular ligament. The proximal 20% was released with the scissors. A Kelly clamp was used to spread to make sure it was completely released. The nerve was pale and flattened, it was freed up from adhesions with a mosquito clamp. Guyon canal was exposed and a clamp placed there while this was unroofed as well. The motor branch of the median nerve was intact. The adhesions were removed. The wound was irrigated and closed with running 4-0 nylon suture. Next an oblique angled incision was made over the palmar aspect of the second and third metacarpal heads starting distally  at the base of the index finger radially and extending ulnarly and then proximally down to the transverse flexion crease. Dissection was carried out through loupe magnification as it was in the first procedure. Upon opening the skin a large yellow fatty lipomatous mass was exposed. It was about 3 x 3 cm in size. Careful dissection was carried out around the mass completely. The neurovascular bundles to the ulnar side of the index finger and radial side of the middle finger were examined. There were followed back to the common digital nerve and the mass was excised off of this area carefully. Once the entire mass was removed the neurovascular bundles were seen to be intact. Some of the remaining fatty tissue was excised. The A1 pulley on the index finger showed significant scarring and this was excised. Range of motion was good. The wound was irrigated. The skin was closed with running 4-0 nylon suture. Both incisions were infiltrated with 0.5% Marcaine and a dry sterile compression hand dressing with volar splint was applied. The tourniquet was deflated with good return of blood flow to the hand. The patient was awakened and taken to recovery in good condition.      ____________________________ Park Breed, MD hem:bu D: 01/14/2014 15:22:55 ET T: 01/14/2014 16:25:51 ET JOB#: [id number]  cc: Park Breed, MD, <Dictator> Park Breed MD ELECTRONICALLY SIGNED 01/14/2014 20:41

## 2014-05-20 LAB — SURGICAL PATHOLOGY

## 2014-06-25 DIAGNOSIS — I10 Essential (primary) hypertension: Secondary | ICD-10-CM | POA: Diagnosis not present

## 2014-06-25 DIAGNOSIS — E1149 Type 2 diabetes mellitus with other diabetic neurological complication: Secondary | ICD-10-CM | POA: Diagnosis not present

## 2014-06-25 DIAGNOSIS — E039 Hypothyroidism, unspecified: Secondary | ICD-10-CM | POA: Diagnosis not present

## 2014-06-25 DIAGNOSIS — D649 Anemia, unspecified: Secondary | ICD-10-CM | POA: Diagnosis not present

## 2014-06-28 DIAGNOSIS — M4806 Spinal stenosis, lumbar region: Secondary | ICD-10-CM | POA: Diagnosis not present

## 2014-06-28 DIAGNOSIS — M5417 Radiculopathy, lumbosacral region: Secondary | ICD-10-CM | POA: Diagnosis not present

## 2014-07-25 DIAGNOSIS — M7550 Bursitis of unspecified shoulder: Secondary | ICD-10-CM | POA: Diagnosis not present

## 2014-07-25 DIAGNOSIS — Z0000 Encounter for general adult medical examination without abnormal findings: Secondary | ICD-10-CM | POA: Diagnosis not present

## 2014-07-25 DIAGNOSIS — S43429A Sprain of unspecified rotator cuff capsule, initial encounter: Secondary | ICD-10-CM | POA: Diagnosis not present

## 2014-07-25 DIAGNOSIS — E119 Type 2 diabetes mellitus without complications: Secondary | ICD-10-CM | POA: Diagnosis not present

## 2014-07-25 DIAGNOSIS — M5440 Lumbago with sciatica, unspecified side: Secondary | ICD-10-CM | POA: Diagnosis not present

## 2014-08-20 DIAGNOSIS — H16133 Photokeratitis, bilateral: Secondary | ICD-10-CM | POA: Diagnosis not present

## 2014-09-05 DIAGNOSIS — M4806 Spinal stenosis, lumbar region: Secondary | ICD-10-CM | POA: Diagnosis not present

## 2014-09-10 ENCOUNTER — Encounter: Payer: Self-pay | Admitting: *Deleted

## 2014-09-11 ENCOUNTER — Other Ambulatory Visit: Payer: Self-pay | Admitting: Orthopedic Surgery

## 2014-09-11 DIAGNOSIS — M48061 Spinal stenosis, lumbar region without neurogenic claudication: Secondary | ICD-10-CM

## 2014-09-16 ENCOUNTER — Ambulatory Visit
Admission: RE | Admit: 2014-09-16 | Discharge: 2014-09-16 | Disposition: A | Discharge status: Home / Self Care | Payer: Medicare Other | Source: Ambulatory Visit | Attending: Orthopedic Surgery | Admitting: Orthopedic Surgery

## 2014-09-16 DIAGNOSIS — M549 Dorsalgia, unspecified: Secondary | ICD-10-CM | POA: Diagnosis not present

## 2014-09-16 DIAGNOSIS — M4806 Spinal stenosis, lumbar region: Secondary | ICD-10-CM | POA: Insufficient documentation

## 2014-09-16 DIAGNOSIS — S43429A Sprain of unspecified rotator cuff capsule, initial encounter: Secondary | ICD-10-CM | POA: Diagnosis not present

## 2014-09-16 DIAGNOSIS — M47896 Other spondylosis, lumbar region: Secondary | ICD-10-CM | POA: Insufficient documentation

## 2014-09-16 DIAGNOSIS — M5127 Other intervertebral disc displacement, lumbosacral region: Secondary | ICD-10-CM | POA: Diagnosis not present

## 2014-09-16 DIAGNOSIS — M48061 Spinal stenosis, lumbar region without neurogenic claudication: Secondary | ICD-10-CM

## 2014-09-16 DIAGNOSIS — M5136 Other intervertebral disc degeneration, lumbar region: Secondary | ICD-10-CM | POA: Insufficient documentation

## 2014-09-16 DIAGNOSIS — M5440 Lumbago with sciatica, unspecified side: Secondary | ICD-10-CM | POA: Diagnosis not present

## 2014-09-18 ENCOUNTER — Encounter: Payer: Self-pay | Admitting: Urology

## 2014-09-18 ENCOUNTER — Ambulatory Visit (INDEPENDENT_AMBULATORY_CARE_PROVIDER_SITE_OTHER): Payer: Medicare Other | Admitting: Urology

## 2014-09-18 VITALS — BP 149/77 | HR 65 | Ht 73.0 in | Wt 232.2 lb

## 2014-09-18 DIAGNOSIS — N138 Other obstructive and reflux uropathy: Secondary | ICD-10-CM | POA: Insufficient documentation

## 2014-09-18 DIAGNOSIS — N528 Other male erectile dysfunction: Secondary | ICD-10-CM | POA: Diagnosis not present

## 2014-09-18 DIAGNOSIS — E291 Testicular hypofunction: Secondary | ICD-10-CM

## 2014-09-18 DIAGNOSIS — N400 Enlarged prostate without lower urinary tract symptoms: Secondary | ICD-10-CM | POA: Diagnosis not present

## 2014-09-18 DIAGNOSIS — N401 Enlarged prostate with lower urinary tract symptoms: Secondary | ICD-10-CM

## 2014-09-18 LAB — BLADDER SCAN AMB NON-IMAGING: Scan Result: 0

## 2014-09-18 MED ORDER — TADALAFIL 20 MG PO TABS: 20.0000 mg | ORAL_TABLET | Freq: Every day | ORAL | Status: DC | PRN

## 2014-09-18 NOTE — Progress Notes (Signed)
09/18/2014 9:27 PM   [name] 04-19-1945 [id number]  Referring provider: No referring provider defined for this encounter.  Chief Complaint  Patient presents with  . Erectile Dysfunction    5 month follow up  . Benign Prostatic Hypertrophy    HPI: Mr. Herren is a 69 year old African American male with hypogonadism, ED and BPH with LUTS who presents today for a follow up.    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  His most recent morning total testosterone level is 478 ng/dL on 03/10/2014.  He has been satisfied with the results of the Testopel and like to continue that treatment modality.  BPH with LUTS His IPSS score today is 6, which is mild lower urinary tract symptomatology. He is mostly satisfied with his quality life due to his urinary symptoms. His PVR is 0 mL.  His previous IPSS score was 0/0.    His major complaint today is incomplete bladder emptying and weak stream which occurs less than half the time.Marland Kitchen  He has had these symptoms for many years.  He denies any dysuria, hematuria or suprapubic pain.   He currently taking tamsulosin and finasteride.     He also denies any recent fevers, chills, nausea or vomiting.  He does not have a family history of PCa.      IPSS      09/18/14 1000       International Prostate Symptom Score   How often have you had the sensation of not emptying your bladder? Less than half the time     How often have you had to urinate less than every two hours? Not at All     How often have you found you stopped and started again several times when you urinated? Less than 1 in 5 times     How often have you found it difficult to postpone urination? Less than 1 in 5 times     How often have you had a weak urinary stream? Less than half the time     How often have you had to strain to start urination? Not at All     How many times did you typically get up at  night to urinate? None     Total IPSS Score 6     Quality of Life due to urinary symptoms   If you were to spend the rest of your life with your urinary condition just the way it is now how would you feel about that? Mostly Satisfied        Score:  1-7 Mild 8-19 Moderate 20-35 Severe  Erectile dysfunction Patient is responding to PD E5 inhibitors.     PMH: Past Medical History  Diagnosis Date  . Testicle tenderness   . Erectile dysfunction   . Dysplasia of prostate   . Rectum pain   . Palindromic rheumatism, hand   . Chronic kidney disease   . Testicle swelling   . Hypogonadism in male   . BPH (benign prostatic hyperplasia)   . Prostatitis   . Over weight   . Testicular mass   . Acute prostatitis   . HTN (hypertension)   . Orchitis and epididymitis     Surgical History: Past Surgical History  Procedure Laterality Date  . Back surgery      Home Medications:    Medication List       This list is accurate as of: 09/18/14  9:27 PM.  Always use your most recent med list.               amLODipine 10 MG tablet  Commonly known as:  NORVASC  Take 10 mg by mouth daily.     aspirin 325 MG tablet  Take by mouth.     finasteride 5 MG tablet  Commonly known as:  PROSCAR  Take 5 mg by mouth daily.     gabapentin 300 MG capsule  Commonly known as:  NEURONTIN  Take 1 tablet 3 times a day.     HYDROcodone-acetaminophen 7.5-325 MG per tablet  Commonly known as:  NORCO     levothyroxine 150 MCG tablet  Commonly known as:  SYNTHROID, LEVOTHROID  Take 150 mcg by mouth daily before breakfast.     lisinopril-hydrochlorothiazide 20-12.5 MG per tablet  Commonly known as:  PRINZIDE,ZESTORETIC  Take 1 tablet by mouth daily.     metFORMIN 1000 MG tablet  Commonly known as:  GLUCOPHAGE  Take 1,000 mg by mouth 2 (two) times daily with a meal.     metoprolol succinate 100 MG 24 hr tablet  Commonly known as:  TOPROL-XL  Take 100 mg by mouth.      oxyCODONE-acetaminophen 5-325 MG per tablet  Commonly known as:  PERCOCET/ROXICET  Take by mouth.     spironolactone 100 MG tablet  Commonly known as:  ALDACTONE  Take by mouth.     tadalafil 20 MG tablet  Commonly known as:  CIALIS  Take 1 tablet (20 mg total) by mouth daily as needed for erectile dysfunction.     tamsulosin 0.4 MG Caps capsule  Commonly known as:  FLOMAX  Take 0.4 mg by mouth daily.     traMADol 50 MG tablet  Commonly known as:  ULTRAM  Take by mouth.     vardenafil 20 MG tablet  Commonly known as:  LEVITRA  Take 20 mg by mouth daily as needed for erectile dysfunction.        Allergies:  Allergies  Allergen Reactions  . Enalapril Other (See Comments)    Family History: Family History  Problem Relation Age of Onset  . Cancer Maternal Aunt   . Prostate cancer Neg Hx   . Kidney disease Neg Hx     Social History:  reports that he has quit smoking. He does not have any smokeless tobacco history on file. He reports that he does not drink alcohol or use illicit drugs.  ROS: UROLOGY Frequent Urination?: No Hard to postpone urination?: No Burning/pain with urination?: No Get up at night to urinate?: No Leakage of urine?: No Urine stream starts and stops?: No Trouble starting stream?: No Do you have to strain to urinate?: No Blood in urine?: No Urinary tract infection?: No Sexually transmitted disease?: No Injury to kidneys or bladder?: No Painful intercourse?: No Weak stream?: No Erection problems?: No Penile pain?: No  Gastrointestinal Nausea?: No Vomiting?: No Indigestion/heartburn?: No Diarrhea?: No Constipation?: No  Constitutional Fever: No Night sweats?: No Weight loss?: No Fatigue?: No  Skin Skin rash/lesions?: No Itching?: No  Eyes Blurred vision?: No Double vision?: No  Ears/Nose/Throat Sore throat?: No Sinus problems?: No  Hematologic/Lymphatic Swollen glands?: No Easy bruising?: No  Cardiovascular Leg  swelling?: No Chest pain?: No  Respiratory Cough?: No Shortness of breath?: No  Endocrine Excessive thirst?: No  Musculoskeletal Back pain?: Yes Joint pain?: No  Neurological Headaches?: No Dizziness?: No  Psychologic Depression?: No Anxiety?: No  Physical Exam:  BP 149/77 mmHg  Pulse 65  Ht 6\' 1"  (1.854 m)  Wt 232 lb 3.2 oz (105.325 kg)  BMI 30.64 kg/m2  GU: Patient with uncircumcised phallus.  Urethral meatus is patent.  No penile discharge. No penile lesions or rashes. Scrotum without lesions, cysts, rashes and/or edema.  Testicles are located scrotally bilaterally. No masses are appreciated in the testicles. Left and right epididymis are normal. Rectal: Patient with  normal sphincter tone. Perineum without scarring or rashes. No rectal masses are appreciated. Prostate is approximately 55 grams, no nodules are appreciated. Seminal vesicles are normal.   Laboratory Data:  Lab Results  Component Value Date   WBC 6.5 10/06/2013   HGB 11.8* 10/06/2013   HCT 37.3* 10/06/2013   MCV 92 10/06/2013   PLT 295 10/06/2013    Lab Results  Component Value Date   CREATININE 1.37* 10/06/2013   PSA History:       0.4 ng/mL on 06/07/2013       0.5 ng/mL on 03/07/2014   Pertinent Imaging: Results for orders placed or performed in visit on 09/18/14  BLADDER SCAN AMB NON-IMAGING  Result Value Ref Range   Scan Result 0     Assessment & Plan:    1. Hypogonadism in male:   Patient's hypogonadism has been managed with Testopel. His last testosterone on 02/14/ 2016 was 478 ng/dL.   We obtained blood work on the patient today. If it is appropriate, we will schedule the patient for his next Testopel insertion.  -Testosterone - Hematocrit  2. BPH (benign prostatic hyperplasia) with LUTS:  Patient's IPSS score is 6/2.  His PVR 0 mL.  His DRE demonstrates enlargement without nodules.  He will continue the finasteride and tamsulosin.  He will follow up in 6 months for a PSA, DRE,  PVR and an IPSS.    - PSA - BLADDER SCAN AMB NON-  3. Erectile dysfunction:   Patient has requested a refill on his Cialis 20 mg on-demand. He states it is working well for him. A prescription is sent to his pharmacy and I have given him samples (#3).     Return for pending labs.  Zara Council, [facility] [address] [place], [place] [address] [phone]

## 2014-09-19 LAB — TESTOSTERONE: TESTOSTERONE: 412 ng/dL (ref 348–1197)

## 2014-09-19 LAB — HEMATOCRIT: Hematocrit: 38.9 % (ref 37.5–51.0)

## 2014-09-19 LAB — PSA: PROSTATE SPECIFIC AG, SERUM: 0.4 ng/mL (ref 0.0–4.0)

## 2014-09-20 ENCOUNTER — Telehealth: Payer: Self-pay

## 2014-09-20 NOTE — Telephone Encounter (Signed)
Spoke with pt in reference to lab results. Pt inquired about receiving more pellets. Please advise.

## 2014-09-20 NOTE — Telephone Encounter (Signed)
-----   Message from [name], PA-C sent at 09/19/2014  9:02 PM EDT ----- Patient's testosterone is within normal limits.  We'll need to repeat it in 3 months with a morning serum total testosterone before 9 AM

## 2014-09-21 NOTE — Telephone Encounter (Signed)
Testosterone is normal. No pellets are needed at this time. I suggest repeating a morning testosterone in 3 months before 9 AM

## 2014-09-23 NOTE — Telephone Encounter (Signed)
Spoke with pt in reference to testosterone levels. Nurse made pt aware no need for pellets at this time. Pt was not happy about this. Made pt aware for the need of another lab draw in 85mo. Pt voiced understanding and hung up.

## 2014-09-26 ENCOUNTER — Other Ambulatory Visit: Payer: Self-pay

## 2014-09-26 DIAGNOSIS — N529 Male erectile dysfunction, unspecified: Secondary | ICD-10-CM

## 2014-09-26 MED ORDER — TADALAFIL 20 MG PO TABS: 20.0000 mg | ORAL_TABLET | Freq: Every day | ORAL | Status: DC | PRN

## 2014-09-29 ENCOUNTER — Emergency Department
Admission: EM | Admit: 2014-09-29 | Discharge: 2014-09-29 | Disposition: A | Discharge status: Home / Self Care | Payer: Medicare Other | Attending: Emergency Medicine | Admitting: Emergency Medicine

## 2014-09-29 ENCOUNTER — Encounter: Payer: Self-pay | Admitting: Medical Oncology

## 2014-09-29 DIAGNOSIS — Z79899 Other long term (current) drug therapy: Secondary | ICD-10-CM | POA: Insufficient documentation

## 2014-09-29 DIAGNOSIS — I129 Hypertensive chronic kidney disease with stage 1 through stage 4 chronic kidney disease, or unspecified chronic kidney disease: Secondary | ICD-10-CM | POA: Diagnosis not present

## 2014-09-29 DIAGNOSIS — L03116 Cellulitis of left lower limb: Secondary | ICD-10-CM | POA: Diagnosis not present

## 2014-09-29 DIAGNOSIS — Z87891 Personal history of nicotine dependence: Secondary | ICD-10-CM | POA: Diagnosis not present

## 2014-09-29 DIAGNOSIS — Z7982 Long term (current) use of aspirin: Secondary | ICD-10-CM | POA: Diagnosis not present

## 2014-09-29 DIAGNOSIS — N189 Chronic kidney disease, unspecified: Secondary | ICD-10-CM | POA: Diagnosis not present

## 2014-09-29 DIAGNOSIS — R21 Rash and other nonspecific skin eruption: Secondary | ICD-10-CM | POA: Diagnosis present

## 2014-09-29 LAB — CBC WITH DIFFERENTIAL/PLATELET
BASOS ABS: 0 10*3/uL (ref 0–0.1)
BASOS PCT: 0 %
EOS ABS: 0 10*3/uL (ref 0–0.7)
EOS PCT: 0 %
HEMATOCRIT: 36.8 % — AB (ref 40.0–52.0)
Hemoglobin: 12.2 g/dL — ABNORMAL LOW (ref 13.0–18.0)
Lymphocytes Relative: 9 %
Lymphs Abs: 1.1 10*3/uL (ref 1.0–3.6)
MCH: 29.3 pg (ref 26.0–34.0)
MCHC: 33 g/dL (ref 32.0–36.0)
MCV: 88.7 fL (ref 80.0–100.0)
MONO ABS: 0.8 10*3/uL (ref 0.2–1.0)
MONOS PCT: 7 %
NEUTROS ABS: 10.4 10*3/uL — AB (ref 1.4–6.5)
Neutrophils Relative %: 84 %
PLATELETS: 156 10*3/uL (ref 150–440)
RBC: 4.15 MIL/uL — ABNORMAL LOW (ref 4.40–5.90)
RDW: 16.2 % — AB (ref 11.5–14.5)
WBC: 12.3 10*3/uL — ABNORMAL HIGH (ref 3.8–10.6)

## 2014-09-29 LAB — BASIC METABOLIC PANEL
Anion gap: 6 (ref 5–15)
BUN: 21 mg/dL — AB (ref 6–20)
CALCIUM: 8.8 mg/dL — AB (ref 8.9–10.3)
CO2: 22 mmol/L (ref 22–32)
CREATININE: 1.62 mg/dL — AB (ref 0.61–1.24)
Chloride: 107 mmol/L (ref 101–111)
GFR calc Af Amer: 49 mL/min — ABNORMAL LOW (ref >60)
GFR, EST NON AFRICAN AMERICAN: 42 mL/min — AB (ref >60)
GLUCOSE: 208 mg/dL — AB (ref 65–99)
Potassium: 4 mmol/L (ref 3.5–5.1)
Sodium: 135 mmol/L (ref 135–145)

## 2014-09-29 MED ORDER — CLINDAMYCIN PHOSPHATE 600 MG/50ML IV SOLN
600.0000 mg | Freq: Once | INTRAVENOUS | Status: AC
  Administered 2014-09-29: 600 mg via INTRAVENOUS
  Filled 2014-09-29: qty 50

## 2014-09-29 MED ORDER — CLINDAMYCIN HCL 150 MG PO CAPS: 300.0000 mg | ORAL_CAPSULE | Freq: Three times a day (TID) | ORAL | Status: DC

## 2014-09-29 MED ORDER — OXYCODONE-ACETAMINOPHEN 5-325 MG PO TABS
2.0000 | ORAL_TABLET | Freq: Once | ORAL | Status: AC
  Administered 2014-09-29: 2 via ORAL
  Filled 2014-09-29: qty 2

## 2014-09-29 MED ORDER — OXYCODONE-ACETAMINOPHEN 5-325 MG PO TABS: 1.0000 | ORAL_TABLET | ORAL | Status: DC | PRN

## 2014-09-29 NOTE — Discharge Instructions (Signed)
RETURN TO ER IF ANY SEVERE WORSENING OF YOUR SYMPTOMS  CLEOCIN AS DIRECTED AND PERCOCET FOR PAIN CALL YOUR DOCTOR FOR AN APPOINTMENT THIS WEEK TO [place] YOUR LAB WORK

## 2014-09-29 NOTE — ED Notes (Signed)
Pt reports possibly getting bitten by spider Friday to left leg, area began as a itchy rash but has now spread up the leg and is more painful.

## 2014-09-29 NOTE — ED Provider Notes (Signed)
[facility] Emergency Department Provider Note  ____________________________________________  Time seen: Approximately 4:23 PM  I have reviewed the triage vital signs and the nursing notes.   HISTORY  Chief Complaint Rash   HPI Paul Nemetz [name] is a 69 y.o. male is here with left leg red and swollen. He states he thinks he was bitten by a spider on Friday. He states he was [place] the [place] near [place] but did not actually see spiders bite him. He states there a lot out there in the area. Last evening he felt warm but did not actually take his temperature. He denies any chills at this time. He denies any previous cellulitis in the past. He states today the redness began spreading up his leg is more painful.Currently he reports his pain a 10 out of 10. He continues to be ambulatory regardless of the pain.   Past Medical History  Diagnosis Date  . Testicle tenderness   . Erectile dysfunction   . Dysplasia of prostate   . Rectum pain   . Palindromic rheumatism, hand   . Chronic kidney disease   . Testicle swelling   . Hypogonadism in male   . BPH (benign prostatic hyperplasia)   . Prostatitis   . Over weight   . Testicular mass   . Acute prostatitis   . HTN (hypertension)   . Orchitis and epididymitis     Patient Active Problem List   Diagnosis Date Noted  . Hypogonadism in male 09/18/2014  . BPH with obstruction/lower urinary tract symptoms 09/18/2014  . Other male erectile dysfunction 09/18/2014    Past Surgical History  Procedure Laterality Date  . Back surgery      Current Outpatient Rx  Name  Route  Sig  Dispense  Refill  . amLODipine (NORVASC) 10 MG tablet   Oral   Take 10 mg by mouth daily.         Marland Kitchen aspirin 325 MG tablet   Oral   Take by mouth.         . clindamycin (CLEOCIN) 150 MG capsule   Oral   Take 2 capsules (300 mg total) by mouth 3 (three) times daily.   60 capsule   0   . finasteride (PROSCAR)  5 MG tablet   Oral   Take 5 mg by mouth daily.         Marland Kitchen gabapentin (NEURONTIN) 300 MG capsule      Take 1 tablet 3 times a day.         Marland Kitchen HYDROcodone-acetaminophen (NORCO) 7.5-325 MG per tablet               . levothyroxine (SYNTHROID, LEVOTHROID) 150 MCG tablet   Oral   Take 150 mcg by mouth daily before breakfast.         . lisinopril-hydrochlorothiazide (PRINZIDE,ZESTORETIC) 20-12.5 MG per tablet   Oral   Take 1 tablet by mouth daily.         . metFORMIN (GLUCOPHAGE) 1000 MG tablet   Oral   Take 1,000 mg by mouth 2 (two) times daily with a meal.         . metoprolol succinate (TOPROL-XL) 100 MG 24 hr tablet   Oral   Take 100 mg by mouth.         . oxyCODONE-acetaminophen (PERCOCET) 5-325 MG per tablet   Oral   Take 1 tablet by mouth every 4 (four) hours as needed for severe pain.  20 tablet   0   . spironolactone (ALDACTONE) 100 MG tablet   Oral   Take by mouth.         . tadalafil (CIALIS) 20 MG tablet   Oral   Take 1 tablet (20 mg total) by mouth daily as needed for erectile dysfunction.   10 tablet   12   . tadalafil (CIALIS) 20 MG tablet   Oral   Take 1 tablet (20 mg total) by mouth daily as needed for erectile dysfunction.   10 tablet   0   . tamsulosin (FLOMAX) 0.4 MG CAPS capsule   Oral   Take 0.4 mg by mouth daily.         . traMADol (ULTRAM) 50 MG tablet   Oral   Take by mouth.         . vardenafil (LEVITRA) 20 MG tablet   Oral   Take 20 mg by mouth daily as needed for erectile dysfunction.           Allergies Enalapril  Family History  Problem Relation Age of Onset  . Cancer Maternal Aunt   . Prostate cancer Neg Hx   . Kidney disease Neg Hx     Social History Social History  Substance Use Topics  . Smoking status: Former Research scientist (life sciences)  . Smokeless tobacco: None     Comment: quit 30 years ago  . Alcohol Use: No    Review of Systems Constitutional: Possible fever/no chills Eyes: No visual changes. ENT:  No sore throat. Cardiovascular: Denies chest pain. Respiratory: Denies shortness of breath. Gastrointestinal: No abdominal pain.  No nausea, no vomiting. Genitourinary: Negative for dysuria. Musculoskeletal: Negative for back pain. Skin: Positive erythema left leg Neurological: Negative for headaches, focal weakness or numbness.  10-point ROS otherwise negative.  ____________________________________________   PHYSICAL EXAM:  VITAL SIGNS: ED Triage Vitals  Enc Vitals Group     BP 09/29/14 1618 143/78 mmHg     Pulse Rate 09/29/14 1618 93     Resp 09/29/14 1618 18     Temp 09/29/14 1618 100.2 F (37.9 C)     Temp Source 09/29/14 1618 Oral     SpO2 09/29/14 1618 96 %     Weight 09/29/14 1618 235 lb (106.595 kg)     Height 09/29/14 1618 6\' 1"  (1.854 m)     Head Cir --      Peak Flow --      Pain Score 09/29/14 1619 9     Pain Loc --      Pain Edu? --      Excl. in [place]? --     Constitutional: Alert and oriented. Well appearing and in no acute distress. Eyes: Conjunctivae are normal. PERRL. EOMI. Head: Atraumatic. Nose: No congestion/rhinnorhea. Neck: No stridor.   Cardiovascular: Normal rate, regular rhythm. Grossly normal heart sounds.  Good peripheral circulation. Respiratory: Normal respiratory effort.  No retractions. Lungs CTAB. Gastrointestinal: Soft and nontender. No distention.  Musculoskeletal moves upper extremities without any difficulty. Right leg range of motion was normal limits. Left leg anterior aspect ankle to mid shaft with erythema, warmth and slightly tender. No drainage is noted. There is no edema noted in lower extremity. Neurologic:  Normal speech and language. No gross focal neurologic deficits are appreciated. No gait instability. Skin:  Skin is warm, dry and intact. Skin as above Psychiatric: Mood and affect are normal. Speech and behavior are normal.  ____________________________________________   LABS (all labs ordered are listed, but  only  abnormal results are displayed)  Labs Reviewed  BASIC METABOLIC PANEL - Abnormal; Notable for the following:    Glucose, Bld 208 (*)    BUN 21 (*)    Creatinine, Ser 1.62 (*)    Calcium 8.8 (*)    GFR calc non Af Amer 42 (*)    GFR calc Af Amer 49 (*)    All other components within normal limits  CBC WITH DIFFERENTIAL/PLATELET - Abnormal; Notable for the following:    WBC 12.3 (*)    RBC 4.15 (*)    Hemoglobin 12.2 (*)    HCT 36.8 (*)    RDW 16.2 (*)    Neutro Abs 10.4 (*)    All other components within normal limits     PROCEDURES  Procedure(s) performed: None  Critical Care performed: No  ____________________________________________   INITIAL IMPRESSION / ASSESSMENT AND PLAN / ED COURSE  Pertinent labs & imaging results that were available during my care of the patient were reviewed by me and considered in my medical decision making (see chart for details).  Patient is pain where of his cellulitis and return to the emergency room if any severe worsening of his leg and swelling. He was started on antibiotic's here in the emergency room and given Percocet for pain. Lab work shows creatinine is elevated. Patient states that he was patient is got clinic and had blood drawn every month. He is not sure why. He now goes to Dr. Rebecka Apley and has had his blood work drawn 3 times there. He states that with his diabetes his blood sugars are "always 103". However patient does not check his blood sugars at home and only has them checked when he is at the doctor's office. He is most likely unaware of his renal functions as his history/diagnosis list includes renal disease. Patient is to follow-up with Dr. Rebecka Apley on Tuesday. He is return to the emergency room if his cellulitis worsens. ____________________________________________   FINAL CLINICAL IMPRESSION(S) / ED DIAGNOSES  Final diagnoses:  Cellulitis of left lower extremity      [name], PA-C 09/29/14 2334  Delman Kitten, MD 09/30/14 0001

## 2014-10-03 DIAGNOSIS — M4806 Spinal stenosis, lumbar region: Secondary | ICD-10-CM | POA: Diagnosis not present

## 2014-10-10 ENCOUNTER — Emergency Department: Payer: Medicare Other

## 2014-10-10 ENCOUNTER — Emergency Department
Admission: EM | Admit: 2014-10-10 | Discharge: 2014-10-10 | Disposition: A | Discharge status: Home / Self Care | Payer: Medicare Other | Attending: Emergency Medicine | Admitting: Emergency Medicine

## 2014-10-10 ENCOUNTER — Encounter: Payer: Self-pay | Admitting: *Deleted

## 2014-10-10 DIAGNOSIS — Z792 Long term (current) use of antibiotics: Secondary | ICD-10-CM | POA: Diagnosis not present

## 2014-10-10 DIAGNOSIS — I129 Hypertensive chronic kidney disease with stage 1 through stage 4 chronic kidney disease, or unspecified chronic kidney disease: Secondary | ICD-10-CM | POA: Diagnosis not present

## 2014-10-10 DIAGNOSIS — R1310 Dysphagia, unspecified: Secondary | ICD-10-CM | POA: Diagnosis not present

## 2014-10-10 DIAGNOSIS — R0989 Other specified symptoms and signs involving the circulatory and respiratory systems: Secondary | ICD-10-CM | POA: Diagnosis not present

## 2014-10-10 DIAGNOSIS — Z79899 Other long term (current) drug therapy: Secondary | ICD-10-CM | POA: Insufficient documentation

## 2014-10-10 DIAGNOSIS — N189 Chronic kidney disease, unspecified: Secondary | ICD-10-CM | POA: Insufficient documentation

## 2014-10-10 DIAGNOSIS — R112 Nausea with vomiting, unspecified: Secondary | ICD-10-CM | POA: Diagnosis not present

## 2014-10-10 DIAGNOSIS — R079 Chest pain, unspecified: Secondary | ICD-10-CM | POA: Diagnosis present

## 2014-10-10 DIAGNOSIS — Z87891 Personal history of nicotine dependence: Secondary | ICD-10-CM | POA: Insufficient documentation

## 2014-10-10 DIAGNOSIS — Z7982 Long term (current) use of aspirin: Secondary | ICD-10-CM | POA: Diagnosis not present

## 2014-10-10 DIAGNOSIS — Z79891 Long term (current) use of opiate analgesic: Secondary | ICD-10-CM | POA: Insufficient documentation

## 2014-10-10 LAB — BASIC METABOLIC PANEL
ANION GAP: 9 (ref 5–15)
BUN: 25 mg/dL — ABNORMAL HIGH (ref 6–20)
CALCIUM: 9.5 mg/dL (ref 8.9–10.3)
CHLORIDE: 106 mmol/L (ref 101–111)
CO2: 26 mmol/L (ref 22–32)
CREATININE: 1.53 mg/dL — AB (ref 0.61–1.24)
GFR calc non Af Amer: 45 mL/min — ABNORMAL LOW (ref >60)
GFR, EST AFRICAN AMERICAN: 52 mL/min — AB (ref >60)
GLUCOSE: 154 mg/dL — AB (ref 65–99)
Potassium: 4.4 mmol/L (ref 3.5–5.1)
Sodium: 141 mmol/L (ref 135–145)

## 2014-10-10 LAB — CBC
HCT: 37.7 % — ABNORMAL LOW (ref 40.0–52.0)
HEMOGLOBIN: 12.4 g/dL — AB (ref 13.0–18.0)
MCH: 29.8 pg (ref 26.0–34.0)
MCHC: 32.9 g/dL (ref 32.0–36.0)
MCV: 90.6 fL (ref 80.0–100.0)
Platelets: 294 10*3/uL (ref 150–440)
RBC: 4.17 MIL/uL — AB (ref 4.40–5.90)
RDW: 15.9 % — ABNORMAL HIGH (ref 11.5–14.5)
WBC: 8.1 10*3/uL (ref 3.8–10.6)

## 2014-10-10 LAB — TROPONIN I

## 2014-10-10 NOTE — ED Provider Notes (Addendum)
[facility] Emergency Department Provider Note  ____________________________________________  Time seen: Approximately 10:25 PM  I have reviewed the triage vital signs and the nursing notes.   HISTORY  Chief Complaint Chest Pain    HPI Paul Vold [name] is a 69 y.o. male patient reports when he eats or drinks feels like something gets hung up in his chest like there is a narrow point there he vomits on occasion after eating. Since she is about 2 hours after eating. He denies having any reflux symptoms or anything coming up in his mouth when he lies down. 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 But then after he told me that he said he eaten 2 hours previously and he usually throws up about 2 hours after eating. Patient really cannot say that he gets this with exercise, he says he gets nauseated and vomits about 2 hours after eating..  Past Medical History  Diagnosis Date  . Testicle tenderness   . Erectile dysfunction   . Dysplasia of prostate   . Rectum pain   . Palindromic rheumatism, hand   . Chronic kidney disease   . Testicle swelling   . Hypogonadism in male   . BPH (benign prostatic hyperplasia)   . Prostatitis   . Over weight   . Testicular mass   . Acute prostatitis   . HTN (hypertension)   . Orchitis and epididymitis     Patient Active Problem List   Diagnosis Date Noted  . Hypogonadism in male 09/18/2014  . BPH with obstruction/lower urinary tract symptoms 09/18/2014  . Other male erectile dysfunction 09/18/2014    Past Surgical History  Procedure Laterality Date  . Back surgery      Current Outpatient Rx  Name  Route  Sig  Dispense  Refill  . amLODipine (NORVASC) 10 MG tablet   Oral   Take 10 mg by mouth daily.         Marland Kitchen aspirin 325 MG tablet   Oral   Take 325 mg by mouth daily.          . clindamycin (CLEOCIN) 150 MG capsule   Oral   Take 2 capsules (300 mg  total) by mouth 3 (three) times daily.   60 capsule   0   . finasteride (PROSCAR) 5 MG tablet   Oral   Take 5 mg by mouth daily.         Marland Kitchen HYDROcodone-acetaminophen (NORCO) 7.5-325 MG per tablet   Oral   Take 1-2 tablets by mouth 2 (two) times daily as needed.          Marland Kitchen levothyroxine (SYNTHROID, LEVOTHROID) 150 MCG tablet   Oral   Take 150 mcg by mouth daily before breakfast.         . lisinopril-hydrochlorothiazide (PRINZIDE,ZESTORETIC) 20-12.5 MG per tablet   Oral   Take 1 tablet by mouth daily.         . metFORMIN (GLUCOPHAGE) 1000 MG tablet   Oral   Take 1,000 mg by mouth 2 (two) times daily with a meal.         . metoprolol succinate (TOPROL-XL) 100 MG 24 hr tablet   Oral   Take 100 mg by mouth daily.          Marland Kitchen oxyCODONE-acetaminophen (PERCOCET) 5-325 MG per tablet   Oral   Take 1 tablet by mouth every 4 (four) hours as needed for severe pain.  20 tablet   0   . spironolactone (ALDACTONE) 100 MG tablet   Oral   Take 100 mg by mouth daily.          . tadalafil (CIALIS) 20 MG tablet   Oral   Take 1 tablet (20 mg total) by mouth daily as needed for erectile dysfunction.   10 tablet   12   . tadalafil (CIALIS) 20 MG tablet   Oral   Take 1 tablet (20 mg total) by mouth daily as needed for erectile dysfunction.   10 tablet   0   . tamsulosin (FLOMAX) 0.4 MG CAPS capsule   Oral   Take 0.4 mg by mouth daily.         . traMADol (ULTRAM) 50 MG tablet   Oral   Take 50 mg by mouth daily.          . vardenafil (LEVITRA) 20 MG tablet   Oral   Take 20 mg by mouth daily as needed for erectile dysfunction.           Allergies Review of patient's allergies indicates no known allergies.  Family History  Problem Relation Age of Onset  . Cancer Maternal Aunt   . Prostate cancer Neg Hx   . Kidney disease Neg Hx     Social History Social History  Substance Use Topics  . Smoking status: Former Research scientist (life sciences)  . Smokeless tobacco: None      Comment: quit 30 years ago  . Alcohol Use: No    Review of Systems Constitutional: No fever/chills Eyes: No visual changes. ENT: No sore throat. Cardiovascular: see history of present illness Respiratory: Denies shortness of breath. Gastrointestinal: No abdominal pain.  No nausea, .  No diarrhea.  No constipation. Genitourinary: Negative for dysuria. Musculoskeletal: Negative for back pain. Skin: Negative for rash. Neurological: Negative for headaches, focal weakness or numbness.  10-point ROS otherwise negative.  ____________________________________________   PHYSICAL EXAM:  VITAL SIGNS: ED Triage Vitals  Enc Vitals Group     BP 10/10/14 1924 146/91 mmHg     Pulse Rate 10/10/14 1924 70     Resp 10/10/14 1924 18     Temp 10/10/14 1924 98.4 F (36.9 C)     Temp Source 10/10/14 1924 Oral     SpO2 10/10/14 1924 99 %     Weight 10/10/14 1924 235 lb (106.595 kg)     Height 10/10/14 1924 6\' 1"  (1.854 m)     Head Cir --      Peak Flow --      Pain Score 10/10/14 1925 8     Pain Loc --      Pain Edu? --      Excl. in [place]? --     Constitutional: Alert and oriented. Well appearing and in no acute distress. Eyes: Conjunctivae are normal. PERRL. EOMI. Head: Atraumatic. Nose: No congestion/rhinnorhea. Mouth/Throat: Mucous membranes are moist.  Oropharynx non-erythematous. Neck: No stridor. Cardiovascular: Normal rate, regular rhythm. Grossly normal heart sounds.  Good peripheral circulation. Respiratory: Normal respiratory effort.  No retractions. Lungs CTAB. Gastrointestinal: Soft and nontender. No distention. No abdominal bruits. No CVA tenderness. Musculoskeletal: No lower extremity tenderness nor edema.  No joint effusions. Neurologic:  Normal speech and language. No gross focal neurologic deficits are appreciated. No gait instability. Skin:  Skin is warm, dry and intact. No rash noted. Psychiatric: Mood and affect are normal. Speech and behavior are  normal.  ____________________________________________   LABS (all labs ordered are  listed, but only abnormal results are displayed)  Labs Reviewed  BASIC METABOLIC PANEL - Abnormal; Notable for the following:    Glucose, Bld 154 (*)    BUN 25 (*)    Creatinine, Ser 1.53 (*)    GFR calc non Af Amer 45 (*)    GFR calc Af Amer 52 (*)    All other components within normal limits  CBC - Abnormal; Notable for the following:    RBC 4.17 (*)    Hemoglobin 12.4 (*)    HCT 37.7 (*)    RDW 15.9 (*)    All other components within normal limits  TROPONIN I   ____________________________________________  EKG  EKG read and interpreted by me shows normal sinus rhythm at a rate of 66 left axis first degree AV block appears to be some left ventricular hypertrophy. EKG looks similar to one done 17 December last year ____________________________________________  RADIOLOGY  Radiology reads X chest x-ray as no acute disease I looked at the film and agree ____________________________________________   PROCEDURES  ____________________________________________   INITIAL IMPRESSION / ASSESSMENT AND PLAN / ED COURSE  Pertinent labs & imaging results that were available during my care of the patient were reviewed by me and considered in my medical decision making (see chart for details). Troponin is negative about 6 hours out from the latest episode  ____________________________________________   FINAL CLINICAL IMPRESSION(S) / ED DIAGNOSES  Final diagnoses:  Difficulty swallowing      Nena Polio, MD 10/11/14 0016  I should add that on discharge patient emphatically state that he can exercise run and do heavy work without any difficulty shortness of breath or chest pain. He is sure that the difficulty swallowing is the only problem going on and again there is no relation to exercise.  Nena Polio, MD 10/11/14 [phone]

## 2014-10-10 NOTE — Discharge Instructions (Signed)
Dysphagia Swallowing problems (dysphagia) occur when solids and liquids seem to stick in your throat on the way down to your stomach, or the food takes longer to get to the stomach. Other symptoms include regurgitating food, noises coming from the throat, chest discomfort with swallowing, and a feeling of fullness or the feeling of something being stuck in your throat when swallowing. When blockage in your throat is complete, it may be associated with drooling. CAUSES  Problems with swallowing may occur because of problems with the muscles. The food cannot be propelled in the usual manner into your stomach. You may have ulcers, scar tissue, or inflammation in the tube down which food travels from your mouth to your stomach (esophagus), which blocks food from passing normally into the stomach. Causes of inflammation include:  Acid reflux from your stomach into your esophagus.  Infection.  Radiation treatment for cancer.  Medicines taken without enough fluids to wash them down into your stomach. You may have nerve problems that prevent signals from being sent to the muscles of your esophagus to contract and move your food down to your stomach. Globus pharyngeus is a relatively common problem in which there is a sense of an obstruction or difficulty in swallowing, without any physical abnormalities of the swallowing passages being found. This problem usually improves over time with reassurance and testing to rule out other causes. DIAGNOSIS Dysphagia can be diagnosed and its cause can be determined by tests in which you swallow a white substance that helps illuminate the inside of your throat (contrast medium) while X-rays are taken. Sometimes a flexible telescope that is inserted down your throat (endoscopy) to look at your esophagus and stomach is used. TREATMENT   If the dysphagia is caused by acid reflux or infection, medicines may be used.  If the dysphagia is caused by problems with your  swallowing muscles, swallowing therapy may be used to help you strengthen your swallowing muscles.  If the dysphagia is caused by a blockage or mass, procedures to remove the blockage may be done. HOME CARE INSTRUCTIONS  Try to eat soft food that is easier to swallow and check your weight on a daily basis to be sure that it is not decreasing.  Be sure to drink liquids when sitting upright (not lying down). SEEK MEDICAL CARE IF:  You are losing weight because you are unable to swallow.  You are coughing when you drink liquids (aspiration).  You are coughing up partially digested food. SEEK IMMEDIATE MEDICAL CARE IF:  You are unable to swallow your own saliva .  You are having shortness of breath or a fever, or both.  You have a hoarse voice along with difficulty swallowing. MAKE SURE YOU:  Understand these instructions.  Will watch your condition.  Will get help right away if you are not doing well or get worse. Document Released: 01/09/2000 Document Revised: 05/28/2013 Document Reviewed: 06/30/2012 [facility] Patient Information 2015 [place], [place]. This information is not intended to replace advice given to you by your health care provider. Make sure you discuss any questions you have with your health care provider.    Please return if you're worse or food every get stuck completely. Please return she have any chest heaviness or tightness or the vomiting doesn't stop. Please give Dr. Candace Cruise the gastroenterologist call in the morning and schedule some follow-up for you he will be able to determine if you have a blockage in your esophagus or if you are having some muscle spasm  there.

## 2014-10-10 NOTE — ED Notes (Signed)
Pt reports that he feels a burning in the right side of his chest, started after eating. Pt has had 2 episodes of vomiting. Pt states he has had similar episodes and takes zantac with relief, but today not getting relief from OTC meds.

## 2014-10-23 DIAGNOSIS — E119 Type 2 diabetes mellitus without complications: Secondary | ICD-10-CM | POA: Diagnosis not present

## 2014-11-06 DIAGNOSIS — S43429A Sprain of unspecified rotator cuff capsule, initial encounter: Secondary | ICD-10-CM | POA: Diagnosis not present

## 2014-11-06 DIAGNOSIS — I10 Essential (primary) hypertension: Secondary | ICD-10-CM | POA: Diagnosis not present

## 2014-11-06 DIAGNOSIS — Z0000 Encounter for general adult medical examination without abnormal findings: Secondary | ICD-10-CM | POA: Diagnosis not present

## 2014-11-06 DIAGNOSIS — Z23 Encounter for immunization: Secondary | ICD-10-CM | POA: Diagnosis not present

## 2014-11-06 DIAGNOSIS — M5440 Lumbago with sciatica, unspecified side: Secondary | ICD-10-CM | POA: Diagnosis not present

## 2014-12-09 DIAGNOSIS — E119 Type 2 diabetes mellitus without complications: Secondary | ICD-10-CM | POA: Diagnosis not present

## 2014-12-09 DIAGNOSIS — M5440 Lumbago with sciatica, unspecified side: Secondary | ICD-10-CM | POA: Diagnosis not present

## 2014-12-09 DIAGNOSIS — S43429A Sprain of unspecified rotator cuff capsule, initial encounter: Secondary | ICD-10-CM | POA: Diagnosis not present

## 2014-12-17 DIAGNOSIS — M7550 Bursitis of unspecified shoulder: Secondary | ICD-10-CM | POA: Diagnosis not present

## 2014-12-17 DIAGNOSIS — K649 Unspecified hemorrhoids: Secondary | ICD-10-CM | POA: Diagnosis not present

## 2014-12-17 DIAGNOSIS — I10 Essential (primary) hypertension: Secondary | ICD-10-CM | POA: Diagnosis not present

## 2015-01-07 DIAGNOSIS — M4806 Spinal stenosis, lumbar region: Secondary | ICD-10-CM | POA: Diagnosis not present

## 2015-01-10 DIAGNOSIS — M549 Dorsalgia, unspecified: Secondary | ICD-10-CM | POA: Diagnosis not present

## 2015-01-10 DIAGNOSIS — I10 Essential (primary) hypertension: Secondary | ICD-10-CM | POA: Diagnosis not present

## 2015-01-10 DIAGNOSIS — M7550 Bursitis of unspecified shoulder: Secondary | ICD-10-CM | POA: Diagnosis not present

## 2015-01-10 DIAGNOSIS — M5440 Lumbago with sciatica, unspecified side: Secondary | ICD-10-CM | POA: Diagnosis not present

## 2015-01-13 DIAGNOSIS — K621 Rectal polyp: Secondary | ICD-10-CM | POA: Diagnosis not present

## 2015-01-13 DIAGNOSIS — D120 Benign neoplasm of cecum: Secondary | ICD-10-CM | POA: Diagnosis not present

## 2015-01-13 DIAGNOSIS — K5730 Diverticulosis of large intestine without perforation or abscess without bleeding: Secondary | ICD-10-CM | POA: Diagnosis not present

## 2015-01-13 DIAGNOSIS — D123 Benign neoplasm of transverse colon: Secondary | ICD-10-CM | POA: Diagnosis not present

## 2015-01-13 DIAGNOSIS — N400 Enlarged prostate without lower urinary tract symptoms: Secondary | ICD-10-CM | POA: Diagnosis not present

## 2015-01-13 DIAGNOSIS — E119 Type 2 diabetes mellitus without complications: Secondary | ICD-10-CM | POA: Diagnosis not present

## 2015-01-13 DIAGNOSIS — Z86010 Personal history of colonic polyps: Secondary | ICD-10-CM | POA: Diagnosis not present

## 2015-01-13 DIAGNOSIS — I10 Essential (primary) hypertension: Secondary | ICD-10-CM | POA: Diagnosis not present

## 2015-01-13 DIAGNOSIS — Z87891 Personal history of nicotine dependence: Secondary | ICD-10-CM | POA: Diagnosis not present

## 2015-01-13 DIAGNOSIS — D122 Benign neoplasm of ascending colon: Secondary | ICD-10-CM | POA: Diagnosis not present

## 2015-01-13 DIAGNOSIS — D126 Benign neoplasm of colon, unspecified: Secondary | ICD-10-CM | POA: Diagnosis not present

## 2015-01-13 DIAGNOSIS — Z79899 Other long term (current) drug therapy: Secondary | ICD-10-CM | POA: Diagnosis not present

## 2015-01-13 DIAGNOSIS — Z1211 Encounter for screening for malignant neoplasm of colon: Secondary | ICD-10-CM | POA: Diagnosis not present

## 2015-03-04 ENCOUNTER — Ambulatory Visit: Payer: Medicare Other | Admitting: Urology

## 2015-03-05 ENCOUNTER — Ambulatory Visit: Payer: Medicare Other | Admitting: Urology

## 2015-03-09 ENCOUNTER — Emergency Department: Payer: PPO

## 2015-03-09 ENCOUNTER — Emergency Department
Admission: EM | Admit: 2015-03-09 | Discharge: 2015-03-09 | Disposition: A | Discharge status: Home / Self Care | Payer: PPO | Attending: Emergency Medicine | Admitting: Emergency Medicine

## 2015-03-09 DIAGNOSIS — J111 Influenza due to unidentified influenza virus with other respiratory manifestations: Secondary | ICD-10-CM | POA: Insufficient documentation

## 2015-03-09 DIAGNOSIS — N189 Chronic kidney disease, unspecified: Secondary | ICD-10-CM | POA: Insufficient documentation

## 2015-03-09 DIAGNOSIS — Z79899 Other long term (current) drug therapy: Secondary | ICD-10-CM | POA: Insufficient documentation

## 2015-03-09 DIAGNOSIS — Z7984 Long term (current) use of oral hypoglycemic drugs: Secondary | ICD-10-CM | POA: Insufficient documentation

## 2015-03-09 DIAGNOSIS — Z7982 Long term (current) use of aspirin: Secondary | ICD-10-CM | POA: Insufficient documentation

## 2015-03-09 DIAGNOSIS — Z87891 Personal history of nicotine dependence: Secondary | ICD-10-CM | POA: Insufficient documentation

## 2015-03-09 DIAGNOSIS — R000 Tachycardia, unspecified: Secondary | ICD-10-CM

## 2015-03-09 DIAGNOSIS — Z792 Long term (current) use of antibiotics: Secondary | ICD-10-CM | POA: Insufficient documentation

## 2015-03-09 DIAGNOSIS — I129 Hypertensive chronic kidney disease with stage 1 through stage 4 chronic kidney disease, or unspecified chronic kidney disease: Secondary | ICD-10-CM | POA: Diagnosis not present

## 2015-03-09 DIAGNOSIS — R05 Cough: Secondary | ICD-10-CM | POA: Diagnosis present

## 2015-03-09 LAB — RAPID INFLUENZA A&B ANTIGENS (ARMC ONLY)
INFLUENZA A (ARMC): POSITIVE
INFLUENZA B (ARMC): NEGATIVE

## 2015-03-09 MED ORDER — ACETAMINOPHEN 325 MG PO TABS
650.0000 mg | ORAL_TABLET | Freq: Once | ORAL | Status: AC | PRN
  Administered 2015-03-09: 650 mg via ORAL

## 2015-03-09 MED ORDER — SODIUM CHLORIDE 0.9 % IV BOLUS (SEPSIS): 1000.0000 mL | Freq: Once | INTRAVENOUS | Status: DC

## 2015-03-09 MED ORDER — OSELTAMIVIR PHOSPHATE 75 MG PO CAPS: 75.0000 mg | ORAL_CAPSULE | Freq: Two times a day (BID) | ORAL | Status: DC

## 2015-03-09 MED ORDER — SODIUM CHLORIDE 0.9 % IV BOLUS (SEPSIS)
1000.0000 mL | Freq: Once | INTRAVENOUS | Status: AC
  Administered 2015-03-09: 1000 mL via INTRAVENOUS

## 2015-03-09 MED ORDER — KETOROLAC TROMETHAMINE 30 MG/ML IJ SOLN
30.0000 mg | Freq: Once | INTRAMUSCULAR | Status: AC
  Administered 2015-03-09: 30 mg via INTRAVENOUS
  Filled 2015-03-09: qty 1

## 2015-03-09 MED ORDER — OSELTAMIVIR PHOSPHATE 75 MG PO CAPS
75.0000 mg | ORAL_CAPSULE | Freq: Once | ORAL | Status: AC
  Administered 2015-03-09: 75 mg via ORAL
  Filled 2015-03-09: qty 1

## 2015-03-09 MED ORDER — ACETAMINOPHEN 325 MG PO TABS
ORAL_TABLET | ORAL | Status: AC
  Filled 2015-03-09: qty 2

## 2015-03-09 NOTE — ED Notes (Signed)
Droplet precautions applied at this time

## 2015-03-09 NOTE — ED Notes (Signed)
Iv established but unable to obtain bloodwork. Pt medicated with tylenol, given a mask and sit in subwait. Influenza collected and sent

## 2015-03-09 NOTE — ED Provider Notes (Addendum)
[facility] Emergency Department Provider Note  ____________________________________________  Time seen: Approximately 12:43 PM  I have reviewed the triage vital signs and the nursing notes.   HISTORY  Chief Complaint Cough    HPI Paul Briggs. is a 70 y.o. male with a history of HTN, DM presenting with nonproductive cough, fever, and diffuse myalgias since last night. Patient denies any rhinorrhea, sore throat, ear pain, shaking chills, urinary symptoms, nausea vomiting or diarrhea, abdominal pain.   Past Medical History  Diagnosis Date  . Testicle tenderness   . Erectile dysfunction   . Dysplasia of prostate   . Rectum pain   . Palindromic rheumatism, hand   . Chronic kidney disease   . Testicle swelling   . Hypogonadism in male   . BPH (benign prostatic hyperplasia)   . Prostatitis   . Over weight   . Testicular mass   . Acute prostatitis   . HTN (hypertension)   . Orchitis and epididymitis     Patient Active Problem List   Diagnosis Date Noted  . Hypogonadism in male 09/18/2014  . BPH with obstruction/lower urinary tract symptoms 09/18/2014  . Other male erectile dysfunction 09/18/2014    Past Surgical History  Procedure Laterality Date  . Back surgery      Current Outpatient Rx  Name  Route  Sig  Dispense  Refill  . amLODipine (NORVASC) 10 MG tablet   Oral   Take 10 mg by mouth daily.         Marland Kitchen aspirin 325 MG tablet   Oral   Take 325 mg by mouth daily.          . clindamycin (CLEOCIN) 150 MG capsule   Oral   Take 2 capsules (300 mg total) by mouth 3 (three) times daily.   60 capsule   0   . finasteride (PROSCAR) 5 MG tablet   Oral   Take 5 mg by mouth daily.         Marland Kitchen HYDROcodone-acetaminophen (NORCO) 7.5-325 MG per tablet   Oral   Take 1-2 tablets by mouth 2 (two) times daily as needed.          Marland Kitchen levothyroxine (SYNTHROID, LEVOTHROID) 150 MCG tablet   Oral   Take 150 mcg by mouth daily before  breakfast.         . lisinopril-hydrochlorothiazide (PRINZIDE,ZESTORETIC) 20-12.5 MG per tablet   Oral   Take 1 tablet by mouth daily.         . metFORMIN (GLUCOPHAGE) 1000 MG tablet   Oral   Take 1,000 mg by mouth 2 (two) times daily with a meal.         . metoprolol succinate (TOPROL-XL) 100 MG 24 hr tablet   Oral   Take 100 mg by mouth daily.          Marland Kitchen oseltamivir (TAMIFLU) 75 MG capsule   Oral   Take 1 capsule (75 mg total) by mouth 2 (two) times daily.   10 capsule   0   . oxyCODONE-acetaminophen (PERCOCET) 5-325 MG per tablet   Oral   Take 1 tablet by mouth every 4 (four) hours as needed for severe pain.   20 tablet   0   . spironolactone (ALDACTONE) 100 MG tablet   Oral   Take 100 mg by mouth daily.          . tadalafil (CIALIS) 20 MG tablet   Oral   Take 1 tablet (  20 mg total) by mouth daily as needed for erectile dysfunction.   10 tablet   12   . tadalafil (CIALIS) 20 MG tablet   Oral   Take 1 tablet (20 mg total) by mouth daily as needed for erectile dysfunction.   10 tablet   0   . tamsulosin (FLOMAX) 0.4 MG CAPS capsule   Oral   Take 0.4 mg by mouth daily.         . traMADol (ULTRAM) 50 MG tablet   Oral   Take 50 mg by mouth daily.          . vardenafil (LEVITRA) 20 MG tablet   Oral   Take 20 mg by mouth daily as needed for erectile dysfunction.           Allergies Review of patient's allergies indicates no known allergies.  Family History  Problem Relation Age of Onset  . Cancer Maternal Aunt   . Prostate cancer Neg Hx   . Kidney disease Neg Hx     Social History Social History  Substance Use Topics  . Smoking status: Former Research scientist (life sciences)  . Smokeless tobacco: Not on file     Comment: quit 30 years ago  . Alcohol Use: No    Review of Systems Constitutional: Positive fever. Negative chills. Positive general malaise. Positive myalgias. Eyes: No visual changes. No eye discharge. ENT: No sore throat. No ear pain. No  congestion or rhinorrhea. Cardiovascular: Denies chest pain, palpitations. Respiratory: Denies shortness of breath.  Positive nonproductive cough. Gastrointestinal: No abdominal pain.  No nausea, no vomiting.  No diarrhea.  No constipation. Genitourinary: Negative for dysuria. Musculoskeletal: Negative for back pain. Skin: Negative for rash. Neurological: Negative for headaches, focal weakness or numbness.  10-point ROS otherwise negative.  ____________________________________________   PHYSICAL EXAM:  VITAL SIGNS: ED Triage Vitals  Enc Vitals Group     BP 03/09/15 1059 122/59 mmHg     Pulse Rate 03/09/15 1059 124     Resp 03/09/15 1059 18     Temp 03/09/15 1059 103 F (39.4 C)     Temp Source 03/09/15 1228 Oral     SpO2 03/09/15 1059 95 %     Weight 03/09/15 1059 235 lb (106.595 kg)     Height 03/09/15 1059 6\' 1"  (1.854 m)     Head Cir --      Peak Flow --      Pain Score 03/09/15 1224 6     Pain Loc --      Pain Edu? --      Excl. in [place]? --     Constitutional: Alert and oriented. Well appearing and in no acute distress. Answer question appropriately. Eyes: Conjunctivae are normal.  EOMI. no eye discharge. Head: Atraumatic. Nose: No congestion/rhinnorhea. Mouth/Throat: Mucous membranes are moist.  Neck: No stridor.  Supple.  No meningismus. Cardiovascular: Fast rate, regular rhythm. No murmurs, rubs or gallops.  Respiratory: Normal respiratory effort.  No retractions. Lungs CTAB.  No wheezes, rales or ronchi. Gastrointestinal: Soft and nontender. No distention. No peritoneal signs. Musculoskeletal: No LE edema.  Neurologic:  Normal speech and language. No gross focal neurologic deficits are appreciated.  Skin:  Skin is warm, dry and intact. No rash noted. Psychiatric: Mood and affect are normal. Speech and behavior are normal.  Normal judgement.  ____________________________________________   LABS (all labs ordered are listed, but only abnormal results are  displayed)  Labs Reviewed  RAPID INFLUENZA A&B ANTIGENS ([facility] ONLY)  ____________________________________________  EKG  ED ECG REPORT I, Eula Listen, the attending physician, personally viewed and interpreted this ECG.   Date: 03/09/2015  EKG Time: 1225  Rate: 102  Rhythm: sinus tachycardia  Axis: Leftward  Intervals: Incomplete left bundle branch block  ST&T Change: No ST elevation or depression. No evidence of ischemia.  ____________________________________________  RADIOLOGY  Dg Chest 2 View  03/09/2015  CLINICAL DATA:  Acute shortness of breath for 1 day. EXAM: CHEST  2 VIEW COMPARISON:  10/10/2014 and prior chest radiographs FINDINGS: The cardiomediastinal silhouette is unremarkable. There is no evidence of focal airspace disease, pulmonary edema, suspicious pulmonary nodule/mass, pleural effusion, or pneumothorax. No acute bony abnormalities are identified. IMPRESSION: No active cardiopulmonary disease. Electronically Signed   By: Margarette Canada M.D.   On: 03/09/2015 12:12    ____________________________________________   PROCEDURES  Procedure(s) performed: None  Critical Care performed: No ____________________________________________   INITIAL IMPRESSION / ASSESSMENT AND PLAN / ED COURSE  Pertinent labs & imaging results that were available during my care of the patient were reviewed by me and considered in my medical decision making (see chart for details).  70 y.o. male with HTN and DM presenting with less than 24 hours of general malaise, myalgias, fever and cough. His influenza testing from triage is positive. He has not been having any nausea vomiting or diarrhea and has no evidence of dehydration. He is tachycardic and febrile so we'll treat him with IV fluids and antipyretics. Will initiate Tamiflu. Anticipate reevaluation and likely discharge home.  ----------------------------------------- 1:51 PM on  03/09/2015 -----------------------------------------  The patient's tachycardia has resolved, and he is feeling significantly better after symptomatically treatment. His chest x-ray does not show any pneumonia. Plan discharge.  ____________________________________________  FINAL CLINICAL IMPRESSION(S) / ED DIAGNOSES  Final diagnoses:  Influenza  Sinus tachycardia ([place])      NEW MEDICATIONS STARTED DURING THIS VISIT:  New Prescriptions   OSELTAMIVIR (TAMIFLU) 75 MG CAPSULE    Take 1 capsule (75 mg total) by mouth 2 (two) times daily.     Eula Listen, MD 03/09/15 1247  Eula Listen, MD 03/09/15 1351

## 2015-03-09 NOTE — Discharge Instructions (Signed)
Please drink plenty of fluid to stay well-hydrated. Please return to the emergency department if you develop fever, shortness of breath, inability to keep down fluids, or any other symptoms concerning to you.

## 2015-03-09 NOTE — ED Notes (Signed)
Cough, bodyaches, fever since last night. Last tylenol last night.

## 2015-03-25 ENCOUNTER — Encounter: Payer: Self-pay | Admitting: Urology

## 2015-03-25 ENCOUNTER — Ambulatory Visit (INDEPENDENT_AMBULATORY_CARE_PROVIDER_SITE_OTHER): Payer: PPO | Admitting: Urology

## 2015-03-25 VITALS — BP 133/73 | HR 67 | Ht 72.0 in | Wt 233.1 lb

## 2015-03-25 DIAGNOSIS — N400 Enlarged prostate without lower urinary tract symptoms: Secondary | ICD-10-CM

## 2015-03-25 DIAGNOSIS — N138 Other obstructive and reflux uropathy: Secondary | ICD-10-CM

## 2015-03-25 DIAGNOSIS — N528 Other male erectile dysfunction: Secondary | ICD-10-CM | POA: Diagnosis not present

## 2015-03-25 DIAGNOSIS — R39198 Other difficulties with micturition: Secondary | ICD-10-CM | POA: Diagnosis not present

## 2015-03-25 DIAGNOSIS — N401 Enlarged prostate with lower urinary tract symptoms: Secondary | ICD-10-CM | POA: Diagnosis not present

## 2015-03-25 LAB — BLADDER SCAN AMB NON-IMAGING: Scan Result: 26

## 2015-03-25 MED ORDER — DIAZEPAM 10 MG PO TABS: ORAL_TABLET | ORAL | Status: DC

## 2015-03-25 NOTE — Progress Notes (Signed)
2:00 PM   Talley Schlund [name] 10-24-1945 [id number]  Referring provider: Cletis Athens, MD [address] [place], [place] [address]  Chief Complaint  Patient presents with  . weak stream  . Prostate enlarged    HPI: [name] is a 70 year old African American male with hypogonadism, ED and BPH with LUTS who presents today for a follow up.    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    He has been satisfied with the results of the Testopel and like to continue that treatment modality.   His most recent serum testosterone was 412 ng/dL on 09/18/2014.  BPH with LUTS His IPSS score today is 5, which is mild lower urinary tract symptomatology. He is mostly satisfied with his quality life due to his urinary symptoms. His PVR is 26 mL.    His major complaint today is incomplete bladder emptying and weak stream which occurs less than half the time.  He has had these symptoms for many years.  He denies any dysuria, hematuria or suprapubic pain.   He currently taking tamsulosin and finasteride.     He also denies any recent fevers, chills, nausea or vomiting.  He does not have a family history of PCa.      IPSS      03/25/15 1300       International Prostate Symptom Score   How often have you had the sensation of not emptying your bladder? Not at All     How often have you had to urinate less than every two hours? Not at All     How often have you found you stopped and started again several times when you urinated? Not at All     How often have you found it difficult to postpone urination? Not at All     How often have you had a weak urinary stream? Almost always     How often have you had to strain to start urination? Not at All     How many times did you typically get up at night to urinate? None     Total IPSS Score 5     Quality of Life due to urinary symptoms   If you were to spend the rest of your life  with your urinary condition just the way it is now how would you feel about that? Mostly Satisfied        Score:  1-7 Mild 8-19 Moderate 20-35 Severe  Erectile dysfunction Patient is responding to PD E5 inhibitors.     PMH: Past Medical History  Diagnosis Date  . Testicle tenderness   . Erectile dysfunction   . Dysplasia of prostate   . Rectum pain   . Palindromic rheumatism, hand   . Chronic kidney disease   . Testicle swelling   . Hypogonadism in male   . BPH (benign prostatic hyperplasia)   . Prostatitis   . Over weight   . Testicular mass   . Acute prostatitis   . HTN (hypertension)   . Orchitis and epididymitis     Surgical History: Past Surgical History  Procedure Laterality Date  . Back surgery      Home Medications:    Medication List       This list is accurate as of: 03/25/15  2:00 PM.  Always use your most recent med list.  amLODipine 10 MG tablet  Commonly known as:  NORVASC  Take 10 mg by mouth daily.     aspirin 325 MG tablet  Take 325 mg by mouth daily.     clindamycin 150 MG capsule  Commonly known as:  CLEOCIN  Take 2 capsules (300 mg total) by mouth 3 (three) times daily.     diazepam 10 MG tablet  Commonly known as:  VALIUM  Take 30 minutes prior to the cystoscopy     finasteride 5 MG tablet  Commonly known as:  PROSCAR  Take 5 mg by mouth daily. Reported on 03/25/2015     HYDROcodone-acetaminophen 7.5-325 MG tablet  Commonly known as:  NORCO  Take 1-2 tablets by mouth 2 (two) times daily as needed.     levothyroxine 150 MCG tablet  Commonly known as:  SYNTHROID, LEVOTHROID  Take 150 mcg by mouth daily before breakfast.     lisinopril-hydrochlorothiazide 20-12.5 MG tablet  Commonly known as:  PRINZIDE,ZESTORETIC  Take 1 tablet by mouth daily.     metFORMIN 1000 MG tablet  Commonly known as:  GLUCOPHAGE  Take 1,000 mg by mouth 2 (two) times daily with a meal.     metoprolol succinate 100 MG 24 hr tablet    Commonly known as:  TOPROL-XL  Take 100 mg by mouth daily.     oseltamivir 75 MG capsule  Commonly known as:  TAMIFLU  Take 1 capsule (75 mg total) by mouth 2 (two) times daily.     oxyCODONE 5 MG immediate release tablet  Commonly known as:  Oxy IR/ROXICODONE  Reported on 03/25/2015     oxyCODONE-acetaminophen 5-325 MG tablet  Commonly known as:  PERCOCET  Take 1 tablet by mouth every 4 (four) hours as needed for severe pain.     spironolactone 100 MG tablet  Commonly known as:  ALDACTONE  Take 100 mg by mouth daily.     tadalafil 20 MG tablet  Commonly known as:  CIALIS  Take 1 tablet (20 mg total) by mouth daily as needed for erectile dysfunction.     tadalafil 20 MG tablet  Commonly known as:  CIALIS  Take 1 tablet (20 mg total) by mouth daily as needed for erectile dysfunction.     tamsulosin 0.4 MG Caps capsule  Commonly known as:  FLOMAX  Take 0.4 mg by mouth daily.     traMADol 50 MG tablet  Commonly known as:  ULTRAM  Take 50 mg by mouth daily. Reported on 03/25/2015     vardenafil 20 MG tablet  Commonly known as:  LEVITRA  Take 20 mg by mouth daily as needed for erectile dysfunction. Reported on 03/25/2015        Allergies:  Allergies  Allergen Reactions  . Enalapril Maleate Other (See Comments)    Family History: Family History  Problem Relation Age of Onset  . Cancer Maternal Aunt   . Prostate cancer Neg Hx   . Kidney disease Neg Hx     Social History:  reports that he has quit smoking. He does not have any smokeless tobacco history on file. He reports that he does not drink alcohol or use illicit drugs.  ROS: UROLOGY Frequent Urination?: No Hard to postpone urination?: No Burning/pain with urination?: No Get up at night to urinate?: No Leakage of urine?: No Urine stream starts and stops?: No Trouble starting stream?: No Do you have to strain to urinate?: No Blood in urine?: No Urinary tract infection?: No Sexually  transmitted disease?:  No Injury to kidneys or bladder?: No Painful intercourse?: No Weak stream?: Yes Erection problems?: No Penile pain?: No  Gastrointestinal Nausea?: No Vomiting?: No Indigestion/heartburn?: No Diarrhea?: No Constipation?: No  Constitutional Fever: No Night sweats?: No Weight loss?: No Fatigue?: No  Skin Skin rash/lesions?: No Itching?: No  Eyes Blurred vision?: No Double vision?: No  Ears/Nose/Throat Sore throat?: No Sinus problems?: No  Hematologic/Lymphatic Swollen glands?: No Easy bruising?: No  Cardiovascular Leg swelling?: No Chest pain?: No  Respiratory Cough?: No Shortness of breath?: No  Endocrine Excessive thirst?: No  Musculoskeletal Back pain?: No Joint pain?: No  Neurological Headaches?: No Dizziness?: No  Psychologic Depression?: No Anxiety?: No  Physical Exam: BP 133/73 mmHg  Pulse 67  Ht 6' (1.829 m)  Wt 233 lb 1.6 oz (105.733 kg)  BMI 31.61 kg/m2  GU: Patient with uncircumcised phallus.  Urethral meatus is patent.  No penile discharge. No penile lesions or rashes. Scrotum without lesions, cysts, rashes and/or edema.  Testicles are located scrotally bilaterally. No masses are appreciated in the testicles. Left and right epididymis are normal. Rectal: Patient with  normal sphincter tone. Perineum without scarring or rashes. No rectal masses are appreciated. Prostate is approximately 55 grams, no nodules are appreciated. Seminal vesicles are normal.   Laboratory Data:  Lab Results  Component Value Date   WBC 8.1 10/10/2014   HGB 12.4* 10/10/2014   HCT 37.7* 10/10/2014   MCV 90.6 10/10/2014   PLT 294 10/10/2014    Lab Results  Component Value Date   CREATININE 1.53* 10/10/2014   PSA History:       0.4 ng/mL on 06/07/2013       0.5 ng/mL on 03/07/2014   Pertinent Imaging: Results for orders placed or performed in visit on 03/25/15  BLADDER SCAN AMB NON-IMAGING  Result Value Ref Range   Scan Result 26      Assessment & Plan:    1. Hypogonadism in male:   Patient's hypogonadism has been managed with Testopel. His last testosterone on 08/24/206 was 412 ng/dL.   He will return in the morning before 9 AM for a testosterone level.     2. BPH (benign prostatic hyperplasia) with LUTS:  Patient's IPSS score is 5/2.  His PVR 26 mL.  His DRE demonstrates enlargement without nodules.  He will continue the finasteride and tamsulosin.     - PSA - BLADDER SCAN AMB NON-  3. Erectile dysfunction:   Patient responds well to Cialis.  4. Spraying of the urinary stream:   Patient will undergo cystoscopy to rule out stricture and to evaluate for BOO.  He has undergone a cystoscopy in the past and found it very uncomfortable.  I have given him a script for Valium to take 30 minutes prior to the procedure.  He is instructed to have a driver.    Return for cystoscopy to rule out stricture.  Zara Council, [facility] [address], [place],  [address] [phone]

## 2015-03-26 ENCOUNTER — Other Ambulatory Visit: Payer: Self-pay

## 2015-03-26 ENCOUNTER — Telehealth: Payer: Self-pay

## 2015-03-26 DIAGNOSIS — N400 Enlarged prostate without lower urinary tract symptoms: Secondary | ICD-10-CM

## 2015-03-26 DIAGNOSIS — E291 Testicular hypofunction: Secondary | ICD-10-CM

## 2015-03-26 LAB — PSA: Prostate Specific Ag, Serum: 0.4 ng/mL (ref 0.0–4.0)

## 2015-03-26 MED ORDER — FINASTERIDE 5 MG PO TABS: 5.0000 mg | ORAL_TABLET | Freq: Every day | ORAL | Status: DC

## 2015-03-26 NOTE — Telephone Encounter (Signed)
-----   Message from [name], PA-C sent at 03/26/2015  8:21 AM EST ----- Patient's PSA is stable.  I suggest annual screenings.

## 2015-03-26 NOTE — Telephone Encounter (Signed)
Spoke with pt in reference to PSA results. Pt voiced understanding.  

## 2015-03-27 ENCOUNTER — Other Ambulatory Visit: Payer: PPO

## 2015-03-27 DIAGNOSIS — E291 Testicular hypofunction: Secondary | ICD-10-CM

## 2015-03-28 LAB — TESTOSTERONE: TESTOSTERONE: 323 ng/dL — AB (ref 348–1197)

## 2015-04-07 ENCOUNTER — Other Ambulatory Visit: Payer: PPO | Admitting: Urology

## 2015-04-07 ENCOUNTER — Encounter: Payer: Self-pay | Admitting: Urology

## 2015-04-08 ENCOUNTER — Telehealth: Payer: Self-pay

## 2015-04-08 NOTE — Telephone Encounter (Signed)
-----   Message from [name], PA-C sent at 04/08/2015  1:50 PM EDT ----- Patient's testosterone is low, but his labs were drawn in the afternoon.  He will need a morning blood draw before 9 AM to confirm the hypogonadism.

## 2015-04-08 NOTE — Telephone Encounter (Signed)
Spoke with pt in reference to low testosterone. Pt stated that he was here on time but it was after 9 before the lab lady called him. Pt stated he would call back when he could come again before 9.

## 2015-04-09 NOTE — Telephone Encounter (Signed)
This is true that he was not taken back on time.  But, I do need a second morning testosterone for the diagnosis of hypogonadism.

## 2015-04-27 ENCOUNTER — Encounter: Payer: Self-pay | Admitting: Emergency Medicine

## 2015-04-27 ENCOUNTER — Emergency Department
Admission: EM | Admit: 2015-04-27 | Discharge: 2015-04-27 | Disposition: A | Discharge status: Home / Self Care | Payer: PPO | Attending: Emergency Medicine | Admitting: Emergency Medicine

## 2015-04-27 ENCOUNTER — Emergency Department: Payer: PPO

## 2015-04-27 DIAGNOSIS — Z888 Allergy status to other drugs, medicaments and biological substances status: Secondary | ICD-10-CM | POA: Insufficient documentation

## 2015-04-27 DIAGNOSIS — N189 Chronic kidney disease, unspecified: Secondary | ICD-10-CM | POA: Insufficient documentation

## 2015-04-27 DIAGNOSIS — M25551 Pain in right hip: Secondary | ICD-10-CM | POA: Diagnosis present

## 2015-04-27 DIAGNOSIS — I129 Hypertensive chronic kidney disease with stage 1 through stage 4 chronic kidney disease, or unspecified chronic kidney disease: Secondary | ICD-10-CM | POA: Insufficient documentation

## 2015-04-27 DIAGNOSIS — Z79899 Other long term (current) drug therapy: Secondary | ICD-10-CM | POA: Diagnosis not present

## 2015-04-27 DIAGNOSIS — Z7982 Long term (current) use of aspirin: Secondary | ICD-10-CM | POA: Diagnosis not present

## 2015-04-27 DIAGNOSIS — Z87891 Personal history of nicotine dependence: Secondary | ICD-10-CM | POA: Diagnosis not present

## 2015-04-27 MED ORDER — ETODOLAC 500 MG PO TABS: 500.0000 mg | ORAL_TABLET | Freq: Two times a day (BID) | ORAL | Status: DC

## 2015-04-27 NOTE — ED Notes (Signed)
Discussed discharge instructions, prescriptions, and follow-up care with patient. No questions or concerns at this time. Pt stable at discharge.  

## 2015-04-27 NOTE — Discharge Instructions (Signed)
Follow-up with your primary care doctor or Dr. Rudene Christians if any continued problems. Keep your appointment on April 18 with your surgeon for follow-up with your back surgery. Begin taking etodolac twice a day with food.

## 2015-04-27 NOTE — ED Provider Notes (Signed)
[facility] Emergency Department Provider Note  ____________________________________________  Time seen: Approximately 2:03 PM  I have reviewed the triage vital signs and the nursing notes.   HISTORY  Chief Complaint Hip Pain   HPI Paul Briggs. is a 70 y.o. male is here with complaint of right hip pain for approximately 1 month. Patient has continued to ambulate. He denies any history of recent injury. He states that 3 months ago he had back surgery and actually has an appointment with his surgeon on April 18.Patient had been taking hydrocodone that was left over from his back surgery without any relief of his hip pain. He denies any history of GI difficulties. Patient has continued to ambulate since beginning of his hip pain. He denies any paresthesias into his right leg. Currently he rates his pain as an 8/10.   Past Medical History  Diagnosis Date  . Testicle tenderness   . Erectile dysfunction   . Dysplasia of prostate   . Rectum pain   . Palindromic rheumatism, hand   . Chronic kidney disease   . Testicle swelling   . Hypogonadism in male   . BPH (benign prostatic hyperplasia)   . Prostatitis   . Over weight   . Testicular mass   . Acute prostatitis   . HTN (hypertension)   . Orchitis and epididymitis     Patient Active Problem List   Diagnosis Date Noted  . Enlarged prostate 03/25/2015  . Urine stream spraying 03/25/2015  . Hypogonadism in male 09/18/2014  . BPH with obstruction/lower urinary tract symptoms 09/18/2014  . Other male erectile dysfunction 09/18/2014    Past Surgical History  Procedure Laterality Date  . Back surgery      Current Outpatient Rx  Name  Route  Sig  Dispense  Refill  . amLODipine (NORVASC) 10 MG tablet   Oral   Take 10 mg by mouth daily.         Marland Kitchen aspirin 325 MG tablet   Oral   Take 325 mg by mouth daily.          . clindamycin (CLEOCIN) 150 MG capsule   Oral   Take 2 capsules (300  mg total) by mouth 3 (three) times daily. Patient not taking: Reported on 03/25/2015   60 capsule   0   . diazepam (VALIUM) 10 MG tablet      Take 30 minutes prior to the cystoscopy   1 tablet   0   . etodolac (LODINE) 500 MG tablet   Oral   Take 1 tablet (500 mg total) by mouth 2 (two) times daily.   20 tablet   3   . finasteride (PROSCAR) 5 MG tablet   Oral   Take 1 tablet (5 mg total) by mouth daily. Reported on 03/25/2015   30 tablet   3   . HYDROcodone-acetaminophen (NORCO) 7.5-325 MG per tablet   Oral   Take 1-2 tablets by mouth 2 (two) times daily as needed.          Marland Kitchen levothyroxine (SYNTHROID, LEVOTHROID) 150 MCG tablet   Oral   Take 150 mcg by mouth daily before breakfast.         . lisinopril-hydrochlorothiazide (PRINZIDE,ZESTORETIC) 20-12.5 MG per tablet   Oral   Take 1 tablet by mouth daily.         . metFORMIN (GLUCOPHAGE) 1000 MG tablet   Oral   Take 1,000 mg by mouth 2 (two) times daily with  a meal.         . metoprolol succinate (TOPROL-XL) 100 MG 24 hr tablet   Oral   Take 100 mg by mouth daily.          Marland Kitchen oxyCODONE (OXY IR/ROXICODONE) 5 MG immediate release tablet      Reported on 03/25/2015         . spironolactone (ALDACTONE) 100 MG tablet   Oral   Take 100 mg by mouth daily.          . tamsulosin (FLOMAX) 0.4 MG CAPS capsule   Oral   Take 0.4 mg by mouth daily.         . traMADol (ULTRAM) 50 MG tablet   Oral   Take 50 mg by mouth daily. Reported on 03/25/2015         . vardenafil (LEVITRA) 20 MG tablet   Oral   Take 20 mg by mouth daily as needed for erectile dysfunction. Reported on 03/25/2015           Allergies Enalapril maleate  Family History  Problem Relation Age of Onset  . Cancer Maternal Aunt   . Prostate cancer Neg Hx   . Kidney disease Neg Hx     Social History Social History  Substance Use Topics  . Smoking status: Former Research scientist (life sciences)  . Smokeless tobacco: None     Comment: quit 30 years ago  .  Alcohol Use: No    Review of Systems Constitutional: No fever/chills Cardiovascular: Denies chest pain. Respiratory: Denies shortness of breath. Gastrointestinal:   No nausea, no vomiting.   Musculoskeletal: Positive back pain secondary to surgery. Positive for right hip pain. Skin: Negative for rash. Neurological: Negative for headaches, focal weakness or numbness.  10-point ROS otherwise negative.  ____________________________________________   PHYSICAL EXAM:  VITAL SIGNS: ED Triage Vitals  Enc Vitals Group     BP 04/27/15 1343 172/84 mmHg     Pulse Rate 04/27/15 1343 76     Resp 04/27/15 1343 15     Temp 04/27/15 1343 98.7 F (37.1 C)     Temp Source 04/27/15 1343 Oral     SpO2 04/27/15 1343 97 %     Weight --      Height --      Head Cir --      Peak Flow --      Pain Score 04/27/15 1342 8     Pain Loc --      Pain Edu? --      Excl. in [place]? --     Constitutional: Alert and oriented. Well appearing and in no acute distress. Eyes: Conjunctivae are normal. PERRL. EOMI. Head: Atraumatic. Nose: No congestion/rhinnorhea. Neck: No stridor.   Cardiovascular: Normal rate, regular rhythm. Grossly normal heart sounds.  Good peripheral circulation. Respiratory: Normal respiratory effort.  No retractions. Lungs CTAB. Gastrointestinal: Soft and nontender. No distention.  Musculoskeletal: There is moderate tenderness on palpation of the right hip lateral aspect and posteriorly. Range of motion is slightly restricted secondary to discomfort. There is minimal pain on abduction of the right leg and increased pain with abduction. Patient also has increased pain with flexion. There is no gross deformity. There is no edema noted of the lower extremity. There is no warmth or redness noted of the entire lower extremity. No edema is noted of the ankle. Neurologic:  Normal speech and language. No gross focal neurologic deficits are appreciated. No gait instability. Skin:  Skin is warm,  dry  and intact. No rash noted. Psychiatric: Mood and affect are normal. Speech and behavior are normal.  ____________________________________________   LABS (all labs ordered are listed, but only abnormal results are displayed)  Labs Reviewed - No data to display  RADIOLOGY  X-ray had no bony abnormalities were seen of the right hip. There is some minor degenerative changes of the lower lumbar spine per radiologist. ____________________________________________   PROCEDURES  Procedure(s) performed: None  Critical Care performed: No  ____________________________________________   INITIAL IMPRESSION / ASSESSMENT AND PLAN / ED COURSE  Pertinent labs & imaging results that were available during my care of the patient were reviewed by me and considered in my medical decision making (see chart for details).  Patient started on etodolac 400 mg twice a day with food. He is to follow-up with his surgeon who performed his back surgery on April 18. Patient is unclear if his surgeon was an orthopedist or neurosurgeon. ____________________________________________   FINAL CLINICAL IMPRESSION(S) / ED DIAGNOSES  Final diagnoses:  Pain in the hip, right      [name], PA-C 04/27/15 1710  Lisa Roca, MD 04/29/15 [phone]

## 2015-04-27 NOTE — ED Notes (Signed)
Patient c/o right hip pain for 2 weeks to 1 month. Patient able to ambulate. Placed in wheel chair for comfort and convenience

## 2015-06-02 ENCOUNTER — Ambulatory Visit (INDEPENDENT_AMBULATORY_CARE_PROVIDER_SITE_OTHER): Payer: PPO | Admitting: Urology

## 2015-06-02 ENCOUNTER — Encounter: Payer: Self-pay | Admitting: Urology

## 2015-06-02 VITALS — BP 122/69 | HR 83 | Ht 73.0 in | Wt 242.8 lb

## 2015-06-02 DIAGNOSIS — N401 Enlarged prostate with lower urinary tract symptoms: Secondary | ICD-10-CM | POA: Diagnosis not present

## 2015-06-02 DIAGNOSIS — N528 Other male erectile dysfunction: Secondary | ICD-10-CM

## 2015-06-02 DIAGNOSIS — E291 Testicular hypofunction: Secondary | ICD-10-CM | POA: Diagnosis not present

## 2015-06-02 DIAGNOSIS — N400 Enlarged prostate without lower urinary tract symptoms: Secondary | ICD-10-CM | POA: Diagnosis not present

## 2015-06-02 DIAGNOSIS — N138 Other obstructive and reflux uropathy: Secondary | ICD-10-CM

## 2015-06-02 LAB — URINALYSIS, COMPLETE
BILIRUBIN UA: NEGATIVE
Ketones, UA: NEGATIVE
LEUKOCYTES UA: NEGATIVE
NITRITE UA: NEGATIVE
PH UA: 5.5 (ref 5.0–7.5)
Protein, UA: NEGATIVE
RBC UA: NEGATIVE
Specific Gravity, UA: >1.03 — ABNORMAL HIGH (ref 1.005–1.030)
UUROB: 0.2 mg/dL (ref 0.2–1.0)

## 2015-06-02 LAB — MICROSCOPIC EXAMINATION
Bacteria, UA: NONE SEEN
RBC MICROSCOPIC, UA: NONE SEEN /HPF (ref 0–?)

## 2015-06-02 LAB — BLADDER SCAN AMB NON-IMAGING: SCAN RESULT: 0

## 2015-06-02 NOTE — Progress Notes (Signed)
2:37 PM   Paul Parman [name] 12-04-1945 [id number]  Referring provider: Cletis Athens, MD [address] [place], [place] [address]  Chief Complaint  Patient presents with  . Advice Only    patient has questions before his cystoscopy    HPI: Patient is a 70 year old African-American male who presents today for further discussion regarding a cystoscopic examination.    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    He has been satisfied with the results of the Testopel and like to continue that treatment modality.   His most recent serum testosterone was 323 ng/dL on 03/27/2015.  He is still feeling well and is not wanting to undergo a Testopel insertion at this time.  BPH with LUTS His IPSS score today is 1, which is mild lower urinary tract symptomatology. He is mostly satisfied with his quality life due to his urinary symptoms. His PVR is 0 mL.  His previous IPSS score was 5/2.  His previous PVR was 26 mL.  He states his previous complaint of incomplete bladder emptying and weak stream which occurs less than half the time has since resolved since his last visit.  He denies any dysuria, hematuria or suprapubic pain.  He currently taking tamsulosin and finasteride.  He also denies any recent fevers, chills, nausea or vomiting.  He does not have a family history of PCa.  His UA today is unremarkable.      IPSS      06/02/15 1400       International Prostate Symptom Score   How often have you had the sensation of not emptying your bladder? Not at All     How often have you had to urinate less than every two hours? Not at All     How often have you found you stopped and started again several times when you urinated? Not at All     How often have you found it difficult to postpone urination? Not at All     How often have you had a weak urinary stream? Less than 1 in 5 times     How often have you had to strain to start  urination? Not at All     How many times did you typically get up at night to urinate? None     Total IPSS Score 1     Quality of Life due to urinary symptoms   If you were to spend the rest of your life with your urinary condition just the way it is now how would you feel about that? Mostly Satisfied        Score:  1-7 Mild 8-19 Moderate 20-35 Severe   Erectile dysfunction Patient is responding to PD E5 inhibitors.     PMH: Past Medical History  Diagnosis Date  . Testicle tenderness   . Erectile dysfunction   . Dysplasia of prostate   . Rectum pain   . Palindromic rheumatism, hand   . Chronic kidney disease   . Testicle swelling   . Hypogonadism in male   . BPH (benign prostatic hyperplasia)   . Prostatitis   . Over weight   . Testicular mass   . Acute prostatitis   . HTN (hypertension)   . Orchitis and epididymitis     Surgical History: Past Surgical History  Procedure Laterality Date  . Back surgery      Home Medications:    Medication List  This list is accurate as of: 06/02/15  2:37 PM.  Always use your most recent med list.               amLODipine 10 MG tablet  Commonly known as:  NORVASC  Take 10 mg by mouth daily.     aspirin 325 MG tablet  Take 325 mg by mouth daily.     clindamycin 150 MG capsule  Commonly known as:  CLEOCIN  Take 2 capsules (300 mg total) by mouth 3 (three) times daily.     diazepam 10 MG tablet  Commonly known as:  VALIUM  Take 30 minutes prior to the cystoscopy     etodolac 500 MG tablet  Commonly known as:  LODINE  Take 1 tablet (500 mg total) by mouth 2 (two) times daily.     finasteride 5 MG tablet  Commonly known as:  PROSCAR  Take 1 tablet (5 mg total) by mouth daily. Reported on 03/25/2015     HYDROcodone-acetaminophen 7.5-325 MG tablet  Commonly known as:  NORCO  Take 1-2 tablets by mouth 2 (two) times daily as needed.     levothyroxine 175 MCG tablet  Commonly known as:  SYNTHROID, LEVOTHROID       lisinopril-hydrochlorothiazide 20-12.5 MG tablet  Commonly known as:  PRINZIDE,ZESTORETIC  Take 1 tablet by mouth daily.     metFORMIN 1000 MG tablet  Commonly known as:  GLUCOPHAGE  Take 1,000 mg by mouth 2 (two) times daily with a meal.     methocarbamol 750 MG tablet  Commonly known as:  ROBAXIN  Reported on 06/02/2015     metoprolol succinate 100 MG 24 hr tablet  Commonly known as:  TOPROL-XL  Take 100 mg by mouth daily.     oseltamivir 75 MG capsule  Commonly known as:  TAMIFLU  Reported on 06/02/2015     oxyCODONE 5 MG immediate release tablet  Commonly known as:  Oxy IR/ROXICODONE  Reported on 03/25/2015     spironolactone 100 MG tablet  Commonly known as:  ALDACTONE  Take 100 mg by mouth daily.     tamsulosin 0.4 MG Caps capsule  Commonly known as:  FLOMAX  Take 0.4 mg by mouth daily. Reported on 06/02/2015     traMADol 50 MG tablet  Commonly known as:  ULTRAM  Take 50 mg by mouth daily. Reported on 06/02/2015     vardenafil 20 MG tablet  Commonly known as:  LEVITRA  Take 20 mg by mouth daily as needed for erectile dysfunction. Reported on 06/02/2015        Allergies:  Allergies  Allergen Reactions  . Enalapril Maleate Other (See Comments)    Family History: Family History  Problem Relation Age of Onset  . Cancer Maternal Aunt   . Prostate cancer Neg Hx   . Kidney disease Neg Hx     Social History:  reports that he has quit smoking. He does not have any smokeless tobacco history on file. He reports that he does not drink alcohol or use illicit drugs.  ROS: UROLOGY Frequent Urination?: No Hard to postpone urination?: No Burning/pain with urination?: No Get up at night to urinate?: No Leakage of urine?: No Urine stream starts and stops?: No Trouble starting stream?: No Do you have to strain to urinate?: No Blood in urine?: No Urinary tract infection?: No Sexually transmitted disease?: No Injury to kidneys or bladder?: No Painful intercourse?:  No Weak stream?: No Erection problems?: No Penile pain?: No  Gastrointestinal Nausea?: No Vomiting?: No Indigestion/heartburn?: No Diarrhea?: No Constipation?: No  Constitutional Fever: No Night sweats?: No Weight loss?: No Fatigue?: No  Skin Skin rash/lesions?: No Itching?: No  Eyes Blurred vision?: Yes Double vision?: No  Ears/Nose/Throat Sore throat?: No Sinus problems?: No  Hematologic/Lymphatic Swollen glands?: No Easy bruising?: No  Cardiovascular Leg swelling?: No Chest pain?: No  Respiratory Cough?: No Shortness of breath?: No  Endocrine Excessive thirst?: No  Musculoskeletal Back pain?: No Joint pain?: Yes  Neurological Headaches?: No Dizziness?: No  Psychologic Depression?: No Anxiety?: No  Physical Exam: BP 122/69 mmHg  Pulse 83  Ht 6\' 1"  (1.854 m)  Wt 242 lb 12.8 oz (110.133 kg)  BMI 32.04 kg/m2  GU: Patient with uncircumcised phallus.  Urethral meatus is patent.  No penile discharge. No penile lesions or rashes. Scrotum without lesions, cysts, rashes and/or edema.  Testicles are located scrotally bilaterally. No masses are appreciated in the testicles. Left and right epididymis are normal. Rectal: Patient with  normal sphincter tone. Perineum without scarring or rashes. No rectal masses are appreciated. Prostate is approximately 55 grams, no nodules are appreciated. Seminal vesicles are normal.   Laboratory Data:  Lab Results  Component Value Date   WBC 8.1 10/10/2014   HGB 12.4* 10/10/2014   HCT 37.7* 10/10/2014   MCV 90.6 10/10/2014   PLT 294 10/10/2014    Lab Results  Component Value Date   CREATININE 1.53* 10/10/2014   PSA History:       0.4 ng/mL on 06/07/2013       0.5 ng/mL on 03/07/2014       0.4 ng/mL on 03/25/2015  Urinalysis Results for orders placed or performed in visit on 06/02/15  Microscopic Examination  Result Value Ref Range   WBC, UA 0-5 0 -  5 /hpf   RBC, UA None seen 0 -  2 /hpf   Epithelial  Cells (non renal) 0-10 0 - 10 /hpf   Bacteria, UA None seen None seen/Few  Urinalysis, Complete  Result Value Ref Range   Specific Gravity, UA >1.030 (H) 1.005 - 1.030   pH, UA 5.5 5.0 - 7.5   Color, UA Yellow Yellow   Appearance Ur Clear Clear   Leukocytes, UA Negative Negative   Protein, UA Negative Negative/Trace   Glucose, UA 1+ (A) Negative   Ketones, UA Negative Negative   RBC, UA Negative Negative   Bilirubin, UA Negative Negative   Urobilinogen, Ur 0.2 0.2 - 1.0 mg/dL   Nitrite, UA Negative Negative   Microscopic Examination See below:   BLADDER SCAN AMB NON-IMAGING  Result Value Ref Range   Scan Result 0    Pertinent Imaging: Results for orders placed or performed in visit on 06/02/15  BLADDER SCAN AMB NON-IMAGING  Result Value Ref Range   Scan Result 0     Assessment & Plan:    1. Hypogonadism in male:   Patient's hypogonadism has been managed with Testopel. His last testosterone on 03/27/2015 was 323 ng/dL.   does not wish to undergo Testopel insertions. He'll be returning in 9 months for his annual exam and we will readdress at that time.   2. BPH (benign prostatic hyperplasia) with LUTS:  Patient's IPSS score is 1/2.  His PVR 0 mL.  His DRE demonstrates enlargement without nodules.  He will continue the finasteride and tamsulosin.   He'll be returning in 9 months for I PSS score, PSA and exam  - BLADDER SCAN AMB NON-  3.  Erectile dysfunction:   Patient responds well to Cialis.  Be returning in 9 months for a SHIM score and exam  4. Spraying of the urinary stream:   Resolved.  Return in about 9 months (around 03/04/2016) for IPSS, SHIM and exam.  Zara Council, [facility] [address] [place], [place] [address] [phone]

## 2015-07-19 ENCOUNTER — Other Ambulatory Visit: Payer: Self-pay

## 2015-07-19 ENCOUNTER — Emergency Department: Payer: PPO

## 2015-07-19 ENCOUNTER — Inpatient Hospital Stay
Admission: EM | Admit: 2015-07-19 | Discharge: 2015-07-21 | DRG: 872 | Disposition: A | Discharge status: Home / Self Care | Payer: PPO | Attending: Internal Medicine | Admitting: Internal Medicine

## 2015-07-19 DIAGNOSIS — Z87891 Personal history of nicotine dependence: Secondary | ICD-10-CM

## 2015-07-19 DIAGNOSIS — Z87430 Personal history of prostatic dysplasia: Secondary | ICD-10-CM

## 2015-07-19 DIAGNOSIS — G629 Polyneuropathy, unspecified: Secondary | ICD-10-CM | POA: Diagnosis present

## 2015-07-19 DIAGNOSIS — N400 Enlarged prostate without lower urinary tract symptoms: Secondary | ICD-10-CM | POA: Diagnosis present

## 2015-07-19 DIAGNOSIS — Z9889 Other specified postprocedural states: Secondary | ICD-10-CM

## 2015-07-19 DIAGNOSIS — Z888 Allergy status to other drugs, medicaments and biological substances status: Secondary | ICD-10-CM

## 2015-07-19 DIAGNOSIS — N183 Chronic kidney disease, stage 3 unspecified: Secondary | ICD-10-CM

## 2015-07-19 DIAGNOSIS — Z809 Family history of malignant neoplasm, unspecified: Secondary | ICD-10-CM

## 2015-07-19 DIAGNOSIS — R6510 Systemic inflammatory response syndrome (SIRS) of non-infectious origin without acute organ dysfunction: Secondary | ICD-10-CM

## 2015-07-19 DIAGNOSIS — J209 Acute bronchitis, unspecified: Secondary | ICD-10-CM | POA: Diagnosis present

## 2015-07-19 DIAGNOSIS — R531 Weakness: Secondary | ICD-10-CM

## 2015-07-19 DIAGNOSIS — B349 Viral infection, unspecified: Secondary | ICD-10-CM | POA: Diagnosis present

## 2015-07-19 DIAGNOSIS — E119 Type 2 diabetes mellitus without complications: Secondary | ICD-10-CM

## 2015-07-19 DIAGNOSIS — A419 Sepsis, unspecified organism: Secondary | ICD-10-CM | POA: Diagnosis not present

## 2015-07-19 DIAGNOSIS — I10 Essential (primary) hypertension: Secondary | ICD-10-CM

## 2015-07-19 DIAGNOSIS — Z79899 Other long term (current) drug therapy: Secondary | ICD-10-CM

## 2015-07-19 DIAGNOSIS — G8929 Other chronic pain: Secondary | ICD-10-CM | POA: Diagnosis present

## 2015-07-19 DIAGNOSIS — E1122 Type 2 diabetes mellitus with diabetic chronic kidney disease: Secondary | ICD-10-CM | POA: Diagnosis present

## 2015-07-19 DIAGNOSIS — I129 Hypertensive chronic kidney disease with stage 1 through stage 4 chronic kidney disease, or unspecified chronic kidney disease: Secondary | ICD-10-CM | POA: Diagnosis present

## 2015-07-19 DIAGNOSIS — Z7982 Long term (current) use of aspirin: Secondary | ICD-10-CM

## 2015-07-19 DIAGNOSIS — E039 Hypothyroidism, unspecified: Secondary | ICD-10-CM

## 2015-07-19 DIAGNOSIS — J42 Unspecified chronic bronchitis: Secondary | ICD-10-CM | POA: Diagnosis present

## 2015-07-19 DIAGNOSIS — M549 Dorsalgia, unspecified: Secondary | ICD-10-CM | POA: Diagnosis present

## 2015-07-19 LAB — CBC WITH DIFFERENTIAL/PLATELET
BASOS ABS: 0.1 10*3/uL (ref 0–0.1)
Eosinophils Absolute: 0 10*3/uL (ref 0–0.7)
Eosinophils Relative: 0 %
HEMATOCRIT: 39.8 % — AB (ref 40.0–52.0)
HEMOGLOBIN: 13.1 g/dL (ref 13.0–18.0)
Lymphocytes Relative: 8 %
Lymphs Abs: 1.2 10*3/uL (ref 1.0–3.6)
MCH: 28.3 pg (ref 26.0–34.0)
MCHC: 32.9 g/dL (ref 32.0–36.0)
MCV: 86.1 fL (ref 80.0–100.0)
MONO ABS: 0.9 10*3/uL (ref 0.2–1.0)
Monocytes Relative: 6 %
NEUTROS ABS: 12.9 10*3/uL — AB (ref 1.4–6.5)
Platelets: 220 10*3/uL (ref 150–440)
RBC: 4.63 MIL/uL (ref 4.40–5.90)
RDW: 15.3 % — AB (ref 11.5–14.5)
WBC: 15 10*3/uL — ABNORMAL HIGH (ref 3.8–10.6)

## 2015-07-19 LAB — URINALYSIS COMPLETE WITH MICROSCOPIC (ARMC ONLY)
Bilirubin Urine: NEGATIVE
GLUCOSE, UA: NEGATIVE mg/dL
LEUKOCYTES UA: NEGATIVE
NITRITE: NEGATIVE
Protein, ur: NEGATIVE mg/dL
SPECIFIC GRAVITY, URINE: 1.023 (ref 1.005–1.030)
Squamous Epithelial / LPF: NONE SEEN
pH: 5 (ref 5.0–8.0)

## 2015-07-19 LAB — TROPONIN I

## 2015-07-19 LAB — COMPREHENSIVE METABOLIC PANEL
ALT: 26 U/L (ref 17–63)
ANION GAP: 12 (ref 5–15)
AST: 39 U/L (ref 15–41)
Albumin: 4.7 g/dL (ref 3.5–5.0)
Alkaline Phosphatase: 79 U/L (ref 38–126)
BILIRUBIN TOTAL: 1.4 mg/dL — AB (ref 0.3–1.2)
BUN: 19 mg/dL (ref 6–20)
CO2: 23 mmol/L (ref 22–32)
Calcium: 9.6 mg/dL (ref 8.9–10.3)
Chloride: 103 mmol/L (ref 101–111)
Creatinine, Ser: 1.46 mg/dL — ABNORMAL HIGH (ref 0.61–1.24)
GFR calc Af Amer: 55 mL/min — ABNORMAL LOW (ref >60)
GFR, EST NON AFRICAN AMERICAN: 47 mL/min — AB (ref >60)
Glucose, Bld: 150 mg/dL — ABNORMAL HIGH (ref 65–99)
POTASSIUM: 3.7 mmol/L (ref 3.5–5.1)
Sodium: 138 mmol/L (ref 135–145)
TOTAL PROTEIN: 8.4 g/dL — AB (ref 6.5–8.1)

## 2015-07-19 LAB — LACTIC ACID, PLASMA: LACTIC ACID, VENOUS: 1.4 mmol/L (ref 0.5–2.0)

## 2015-07-19 MED ORDER — ACETAMINOPHEN 500 MG PO TABS
ORAL_TABLET | ORAL | Status: AC
  Filled 2015-07-19: qty 2

## 2015-07-19 MED ORDER — SODIUM CHLORIDE 0.9 % IV BOLUS (SEPSIS)
1000.0000 mL | Freq: Once | INTRAVENOUS | Status: AC
  Administered 2015-07-19: 1000 mL via INTRAVENOUS

## 2015-07-19 MED ORDER — PIPERACILLIN-TAZOBACTAM 3.375 G IVPB
3.3750 g | Freq: Once | INTRAVENOUS | Status: AC
  Administered 2015-07-19: 3.375 g via INTRAVENOUS
  Filled 2015-07-19: qty 50

## 2015-07-19 MED ORDER — OXYCODONE HCL 5 MG PO TABS
10.0000 mg | ORAL_TABLET | Freq: Once | ORAL | Status: AC
  Administered 2015-07-19: 10 mg via ORAL
  Filled 2015-07-19: qty 2

## 2015-07-19 MED ORDER — ACETAMINOPHEN 500 MG PO TABS
1000.0000 mg | ORAL_TABLET | Freq: Once | ORAL | Status: AC
  Administered 2015-07-19: 1000 mg via ORAL

## 2015-07-19 MED ORDER — VANCOMYCIN HCL IN DEXTROSE 1-5 GM/200ML-% IV SOLN
1000.0000 mg | Freq: Once | INTRAVENOUS | Status: AC
  Administered 2015-07-19: 1000 mg via INTRAVENOUS
  Filled 2015-07-19: qty 200

## 2015-07-19 NOTE — ED Provider Notes (Addendum)
[facility] Emergency Department Provider Note   ____________________________________________  Time seen: Approximately 9:14 PM  I have reviewed the triage vital signs and the nursing notes.   HISTORY  Chief Complaint Code Sepsis and Fall    HPI Paul Farleigh [name] is a 70 y.o. male with history of chronic kidney disease and hypertension who presents for evaluation of generalized weakness and fever today, gradual onset this afternoon, severe, no modifying factors. The patient reports that he was in his usual state of health until this afternoon when he suddenly began "feeling really bad". He developed a fever and myalgias. He went outside and was feeling weak and fell striking his head, no loss of consciousness. He denies any severe headache or neck pain at this time. He reports that he was incontinent of urine when he fell outside. He denies any recent illness, no nausea, vomiting, diarrhea, chest pain, difficulty breathing, cough, runny nose, sore throat or abdominal pain.   Past Medical History  Diagnosis Date  . Testicle tenderness   . Erectile dysfunction   . Dysplasia of prostate   . Rectum pain   . Palindromic rheumatism, hand   . Chronic kidney disease   . Testicle swelling   . Hypogonadism in male   . BPH (benign prostatic hyperplasia)   . Prostatitis   . Over weight   . Testicular mass   . Acute prostatitis   . HTN (hypertension)   . Orchitis and epididymitis     Patient Active Problem List   Diagnosis Date Noted  . Sepsis ([place]) 07/19/2015  . HTN (hypertension) 07/19/2015  . Enlarged prostate 03/25/2015  . Urine stream spraying 03/25/2015  . Hypogonadism in male 09/18/2014  . BPH with obstruction/lower urinary tract symptoms 09/18/2014  . Other male erectile dysfunction 09/18/2014    Past Surgical History  Procedure Laterality Date  . Back surgery      Current Outpatient Rx  Name  Route  Sig  Dispense  Refill  .  amLODipine (NORVASC) 10 MG tablet   Oral   Take 10 mg by mouth daily.         Marland Kitchen aspirin 325 MG tablet   Oral   Take 325 mg by mouth daily.          Marland Kitchen etodolac (LODINE) 500 MG tablet   Oral   Take 1 tablet (500 mg total) by mouth 2 (two) times daily.   20 tablet   3   . finasteride (PROSCAR) 5 MG tablet   Oral   Take 1 tablet (5 mg total) by mouth daily. Reported on 03/25/2015   30 tablet   3   . gabapentin (NEURONTIN) 100 MG capsule   Oral   Take 1 capsule by mouth 3 (three) times daily.         Marland Kitchen gabapentin (NEURONTIN) 300 MG capsule   Oral   Take 1 capsule by mouth 3 (three) times daily.         Marland Kitchen HYDROcodone-acetaminophen (NORCO) 7.5-325 MG per tablet   Oral   Take 1-2 tablets by mouth 2 (two) times daily as needed.          Marland Kitchen levothyroxine (SYNTHROID, LEVOTHROID) 175 MCG tablet   Oral   Take 175 mcg by mouth daily before breakfast.          . lisinopril-hydrochlorothiazide (PRINZIDE,ZESTORETIC) 20-12.5 MG per tablet   Oral   Take 1 tablet by mouth daily.         Marland Kitchen  metFORMIN (GLUCOPHAGE) 1000 MG tablet   Oral   Take 1,000 mg by mouth 2 (two) times daily with a meal.         . methocarbamol (ROBAXIN) 750 MG tablet   Oral   Take 750 mg by mouth every 8 (eight) hours as needed. Reported on 06/02/2015         . metoprolol succinate (TOPROL-XL) 100 MG 24 hr tablet   Oral   Take 100 mg by mouth daily.          Marland Kitchen spironolactone (ALDACTONE) 100 MG tablet   Oral   Take 100 mg by mouth daily.          . tamsulosin (FLOMAX) 0.4 MG CAPS capsule   Oral   Take 0.4 mg by mouth daily. Reported on 06/02/2015         . traMADol (ULTRAM) 50 MG tablet   Oral   Take 50 mg by mouth daily. Reported on 06/02/2015         . vardenafil (LEVITRA) 20 MG tablet   Oral   Take 20 mg by mouth daily as needed for erectile dysfunction. Reported on 06/02/2015           Allergies Enalapril maleate  Family History  Problem Relation Age of Onset  . Cancer  Maternal Aunt   . Prostate cancer Neg Hx   . Kidney disease Neg Hx     Social History Social History  Substance Use Topics  . Smoking status: Former Research scientist (life sciences)  . Smokeless tobacco: Not on file     Comment: quit 30 years ago  . Alcohol Use: No    Review of Systems Constitutional: + fever/chills Eyes: No visual changes. ENT: No sore throat. Cardiovascular: Denies chest pain. Respiratory: Denies shortness of breath. Gastrointestinal: No abdominal pain.  No nausea, no vomiting.  No diarrhea.  No constipation. Genitourinary: Negative for dysuria. Musculoskeletal: Negative for back pain. Skin: Negative for rash. Neurological: Negative for headaches, focal weakness or numbness.  10-point ROS otherwise negative.  ____________________________________________   PHYSICAL EXAM:  VITAL SIGNS: ED Triage Vitals  Enc Vitals Group     BP 07/19/15 2107 156/86 mmHg     Pulse Rate 07/19/15 2107 122     Resp 07/19/15 2107 24     Temp 07/19/15 2107 103.1 F (39.5 C)     Temp Source 07/19/15 2107 Oral     SpO2 07/19/15 2107 95 %     Weight 07/19/15 2107 220 lb (99.791 kg)     Height 07/19/15 2107 6\' 1"  (1.854 m)     Head Cir --      Peak Flow --      Pain Score 07/19/15 2108 8     Pain Loc --      Pain Edu? --      Excl. in [place]? --     Constitutional: Alert and oriented. Nontoxic appearing and in no acute distress. Eyes: Conjunctivae are normal. PERRL. EOMI. Head: Atraumatic. Nose: No congestion/rhinnorhea. Mouth/Throat: Mucous membranes are moist.  Oropharynx non-erythematous. Neck: No stridor.  Supple without meningismus. No midline C-spine tenderness to palpation. Cardiovascular: Tachycardic rate, regular rhythm. Grossly normal heart sounds.  Good peripheral circulation. Respiratory: Normal respiratory effort.  No retractions. Lungs CTAB. Mildly tachypneic. Gastrointestinal: Soft and nontender. No distention.  No CVA tenderness. Genitourinary: Deferred Musculoskeletal: No lower  extremity tenderness nor edema.  No joint effusions. Neurologic:  Normal speech and language. No gross focal neurologic deficits are appreciated.  Skin:  Skin is warm, dry and intact. No rash noted. Psychiatric: Mood and affect are normal. Speech and behavior are normal.  ____________________________________________   LABS (all labs ordered are listed, but only abnormal results are displayed)  Labs Reviewed  COMPREHENSIVE METABOLIC PANEL - Abnormal; Notable for the following:    Glucose, Bld 150 (*)    Creatinine, Ser 1.46 (*)    Total Protein 8.4 (*)    Total Bilirubin 1.4 (*)    GFR calc non Af Amer 47 (*)    GFR calc Af Amer 55 (*)    All other components within normal limits  CBC WITH DIFFERENTIAL/PLATELET - Abnormal; Notable for the following:    WBC 15.0 (*)    HCT 39.8 (*)    RDW 15.3 (*)    Neutro Abs 12.9 (*)    All other components within normal limits  URINALYSIS COMPLETEWITH MICROSCOPIC ([facility] ONLY) - Abnormal; Notable for the following:    Color, Urine YELLOW (*)    APPearance CLEAR (*)    Ketones, ur 1+ (*)    Hgb urine dipstick 1+ (*)    Bacteria, UA RARE (*)    All other components within normal limits  CULTURE, BLOOD (ROUTINE X 2)  CULTURE, BLOOD (ROUTINE X 2)  URINE CULTURE  LACTIC ACID, PLASMA  TROPONIN I  LACTIC ACID, PLASMA   ____________________________________________  EKG  ED ECG REPORT I, Joanne Gavel, the attending physician, personally viewed and interpreted this ECG.   Date: 07/19/2015  EKG Time: 21:07  Rate: 118  Rhythm: sinus tachycardia  Axis: normal  Intervals:Nonspecific intraventricular conduction delay  ST&T Change: No acute ST elevation MI. J-point elevation in diffuse leads  was seen on prior EKG. Chronic LVH with QRS widening.  ED ECG REPORT I, Joanne Gavel, the attending physician, personally viewed and interpreted this ECG.   Date: 07/19/2015  EKG Time: 22:33  Rate: 94  Rhythm: normal sinus rhythm  Axis: normal   Intervals:nonspecific intraventricular conduction delay  ST&T Change: No acute ST elevation MI. J-point elevation again noted in diffuse leads. Chronic LVH with QRS widening.   ____________________________________________  RADIOLOGY  CXR  IMPRESSION: No active cardiopulmonary disease.  CT head IMPRESSION: No evidence of traumatic intracranial injury or fracture.  ____________________________________________   PROCEDURES  Procedure(s) performed: None  Critical Care performed: Total critical care time spent 35 minutes.  CRITICAL CARE Performed by: Loura Pardon A   Total critical care time: 35 minutes  Critical care time was exclusive of separately billable procedures and treating other patients.  Critical care was necessary to treat or prevent imminent or life-threatening deterioration.  Critical care was time spent personally by me on the following activities: development of treatment plan with patient and/or surrogate as well as nursing, discussions with consultants, evaluation of patient's response to treatment, examination of patient, obtaining history from patient or surrogate, ordering and performing treatments and interventions, ordering and review of laboratory studies, ordering and review of radiographic studies, pulse oximetry and re-evaluation of patient's condition.  ____________________________________________   INITIAL IMPRESSION / ASSESSMENT AND PLAN / ED COURSE  Pertinent labs & imaging results that were available during my care of the patient were reviewed by me and considered in my medical decision making (see chart for details).  Cael Skuse [name] is a 70 y.o. male with history of chronic kidney disease and hypertension who presents for evaluation of generalized weakness and fever today, gradual onset this afternoon, severe. On exam, he is sitting up  in bed, nontoxic appearing and in no acute distress. His vital signs are notable for fever, to  mature 103.1, he is tachycardic and tachypneic meeting multiple Sirs criteria. CODE STATUS is initiated. Will give liberal IV fluids, vancomycin, Zosyn and obtain urinalysis, labs, chest x-ray as well as CT head to rule out any acute traumatic pathology. Anticipate admission.  ----------------------------------------- 10:39 PM on 07/19/2015 ----------------------------------------- Vital signs improving with the above treatments. I reviewed the patient's labs. Creatinine is mildly elevated 1.46, reassuring lactic acid, not hypotensive, no severe sepsis so will not give full 30 milli-liter per kilogram bolus of normal saline and will monitor him clinically. He does have leukocytosis. Negative troponin. Imaging negative. Urinalysis is not consistent with infection. Source of sepsis is not clear at this time, bacteremia is on the differential certainly given fever without any clear source at this time. Case discussed with hospitalist for admission at this time.  ____________________________________________   FINAL CLINICAL IMPRESSION(S) / ED DIAGNOSES  Final diagnoses:  SIRS (systemic inflammatory response syndrome) (HCC)  Generalized weakness      NEW MEDICATIONS STARTED DURING THIS VISIT:  New Prescriptions   No medications on file     Note:  This document was prepared using Dragon voice recognition software and may include unintentional dictation errors.    Joanne Gavel, MD 07/19/15 [id number]  Joanne Gavel, MD 07/19/15 [phone]

## 2015-07-19 NOTE — ED Notes (Signed)
Pt reports he was  feeling bad and laid down for a while reports he got up and went outside when he got week and feel hitting the back of his head and back. Pt has fever, tachycardic and tachypenic

## 2015-07-19 NOTE — ED Notes (Addendum)
Patient denies any N/V/D, CP or chills. Patient states, "Its just hit me. All of the sudden. I just feel weak and really bad." Patient states he was outside and fell. Patient states he was unable to maintain control of his bladder while laying on the ground. Patient made aware of need of urine sample. States he is unable to void at this time.

## 2015-07-19 NOTE — ED Notes (Signed)
Patient transported to x-ray. ?

## 2015-07-19 NOTE — Progress Notes (Signed)
Pharmacy Antibiotic Note  Paul Briggs. is a 70 y.o. male admitted on 07/19/2015 with sepsis.  Pharmacy has been consulted for Zosyn and vancomycin dosing.  Plan: 1. Zosyn 3.375 gm IV Q8H EI 2. Vancomycin 1 gm IV x 1 in ED followed in 6 hours (stacked dosing) by vancomycin 1 gm IV Q12H, predicted trough 18 mcg/mL. Pharmacy will continue to follow and adjust as needed to maintain trough 15 to 20 mcg/mL.   Vd 61.5 L, Ke 0.054 hr-1, T1/2 12.9 hr  Height: 6\' 1"  (185.4 cm) Weight: 220 lb (99.791 kg) IBW/kg (Calculated) : 79.9  Temp (24hrs), Avg:101.6 F (38.7 C), Min:100.1 F (37.8 C), Max:103.1 F (39.5 C)   Recent Labs Lab 07/19/15 2100  WBC 15.0*  CREATININE 1.46*  LATICACIDVEN 1.4    Estimated Creatinine Clearance: 59.4 mL/min (by C-G formula based on Cr of 1.46).    Allergies  Allergen Reactions  . Enalapril Maleate Other (See Comments)    Thank you for allowing pharmacy to be a part of this patient's care.  Laural Benes, Pharm.D., BCPS Clinical Pharmacist 07/19/2015 11:29 PM

## 2015-07-19 NOTE — ED Notes (Signed)
Patients soiled/wet pants, underwear and socks removed. Blue scrub pants and blue socks given to patient.

## 2015-07-19 NOTE — ED Notes (Signed)
Patient given meal tray.

## 2015-07-19 NOTE — H&P (Signed)
PCP:   Cletis Athens, MD   Chief Complaint:  Weakness  HPI: This is a 70 year old male who today he woke up and felt very weak. As he continued to do to his daily activities he felt bad and became profoundly weak. He denies any nausea, vomiting, diarrhea, shortness of breath, cough, burning urination, fever, chills or altered mentation. He fell, secondary to his weakness. He fell backwards and hit the back of his head. There was no loss of consciousness.Marland Kitchen His are brought him to the ER. History provided by the patient as well as his wife present at bedside. In the ER he was febrile with a temperature of 103.1.   Review of Systems:  The patient denies anorexia, fever, weight loss, weakness, vision loss, decreased hearing, hoarseness, chest pain, syncope, dyspnea on exertion, peripheral edema, balance deficits, hemoptysis, abdominal pain, melena, hematochezia, severe indigestion/heartburn, hematuria, incontinence, genital sores, muscle weakness, suspicious skin lesions, transient blindness, difficulty walking, depression, unusual weight change, abnormal bleeding, enlarged lymph nodes, angioedema, and breast masses.  Past Medical History: Past Medical History  Diagnosis Date  . Testicle tenderness   . Erectile dysfunction   . Dysplasia of prostate   . Rectum pain   . Palindromic rheumatism, hand   . Chronic kidney disease   . Testicle swelling   . Hypogonadism in male   . BPH (benign prostatic hyperplasia)   . Prostatitis   . Over weight   . Testicular mass   . Acute prostatitis   . HTN (hypertension)   . Orchitis and epididymitis    Past Surgical History  Procedure Laterality Date  . Back surgery      Medications: Prior to Admission medications   Medication Sig Start Date End Date Taking? Authorizing Provider  amLODipine (NORVASC) 10 MG tablet Take 10 mg by mouth daily.   Yes Historical Provider, MD  aspirin 325 MG tablet Take 325 mg by mouth daily.    Yes Historical Provider, MD   etodolac (LODINE) 500 MG tablet Take 1 tablet (500 mg total) by mouth 2 (two) times daily. 04/27/15  Yes [name], PA-C  finasteride (PROSCAR) 5 MG tablet Take 1 tablet (5 mg total) by mouth daily. Reported on 03/25/2015 03/26/15  Yes Shannon A McGowan, PA-C  gabapentin (NEURONTIN) 300 MG capsule Take 1 capsule by mouth at bedtime.  06/30/15  Yes Historical Provider, MD  HYDROcodone-acetaminophen (NORCO) 7.5-325 MG per tablet Take 1-2 tablets by mouth 2 (two) times daily as needed.  09/16/14  Yes Historical Provider, MD  levothyroxine (SYNTHROID, LEVOTHROID) 175 MCG tablet Take 175 mcg by mouth daily before breakfast.  05/22/15  Yes Historical Provider, MD  lisinopril-hydrochlorothiazide (PRINZIDE,ZESTORETIC) 20-12.5 MG per tablet Take 1 tablet by mouth daily.   Yes Historical Provider, MD  metFORMIN (GLUCOPHAGE) 1000 MG tablet Take 1,000 mg by mouth 2 (two) times daily with a meal.   Yes Historical Provider, MD  methocarbamol (ROBAXIN) 750 MG tablet Take 750 mg by mouth every 8 (eight) hours as needed. Reported on 06/02/2015 04/15/15  Yes Historical Provider, MD  metoprolol succinate (TOPROL-XL) 100 MG 24 hr tablet Take 100 mg by mouth daily.    Yes Historical Provider, MD  spironolactone (ALDACTONE) 100 MG tablet Take 100 mg by mouth daily.    Yes Historical Provider, MD  tamsulosin (FLOMAX) 0.4 MG CAPS capsule Take 0.4 mg by mouth daily. Reported on 06/02/2015   Yes Historical Provider, MD  traMADol (ULTRAM) 50 MG tablet Take 50 mg by mouth daily. Reported on  06/02/2015   Yes Historical Provider, MD  vardenafil (LEVITRA) 20 MG tablet Take 20 mg by mouth daily as needed for erectile dysfunction. Reported on 06/02/2015   Yes Historical Provider, MD    Allergies:   Allergies  Allergen Reactions  . Enalapril Maleate Other (See Comments)    Social History:  reports that he has quit smoking. He does not have any smokeless tobacco history on file. He reports that he does not drink alcohol or use illicit  drugs.  Family History: Family History  Problem Relation Age of Onset  . Cancer Maternal Aunt   . Prostate cancer Neg Hx   . Kidney disease Neg Hx     Physical Exam: Filed Vitals:   07/19/15 2223 07/19/15 2230 07/19/15 2300 07/19/15 2300  BP: 148/78 149/85 145/78   Pulse: 98 98 82   Temp:    100.1 F (37.8 C)  TempSrc:      Resp: 21 20 24    Height:      Weight:      SpO2: 95% 97% 80%     General:  Alert and oriented times three, well developed and nourished, no acute distress Eyes: PERRLA, pink conjunctiva, no scleral icterus ENT: Moist oral mucosa, neck supple, no thyromegaly Lungs: clear to ascultation, no wheeze, no crackles, no use of accessory muscles Cardiovascular: regular rate and rhythm, no regurgitation, no gallops, no murmurs. No carotid bruits, no JVD Abdomen: soft, positive BS, non-tender, non-distended, no organomegaly, not an acute abdomen GU: not examined Neuro: CN II - XII grossly intact, sensation intact Musculoskeletal: strength 5/5 all extremities, no clubbing, cyanosis or edema Skin: no rash, no subcutaneous crepitation, no decubitus Psych: appropriate patient   Labs on Admission:   Recent Labs  07/19/15 2100  NA 138  K 3.7  CL 103  CO2 23  GLUCOSE 150*  BUN 19  CREATININE 1.46*  CALCIUM 9.6    Recent Labs  07/19/15 2100  AST 39  ALT 26  ALKPHOS 79  BILITOT 1.4*  PROT 8.4*  ALBUMIN 4.7   No results for input(s): LIPASE, AMYLASE in the last 72 hours.  Recent Labs  07/19/15 2100  WBC 15.0*  NEUTROABS 12.9*  HGB 13.1  HCT 39.8*  MCV 86.1  PLT 220    Recent Labs  07/19/15 2100  TROPONINI <0.03   Invalid input(s): POCBNP No results for input(s): DDIMER in the last 72 hours. No results for input(s): HGBA1C in the last 72 hours. No results for input(s): CHOL, HDL, LDLCALC, TRIG, CHOLHDL, LDLDIRECT in the last 72 hours. No results for input(s): TSH, T4TOTAL, T3FREE, THYROIDAB in the last 72 hours.  Invalid input(s):  FREET3 No results for input(s): VITAMINB12, FOLATE, FERRITIN, TIBC, IRON, RETICCTPCT in the last 72 hours.  Micro Results: No results found for this or any previous visit (from the past 240 hour(s)).   Radiological Exams on Admission: Dg Chest 2 View  07/19/2015  CLINICAL DATA:  16 old male with fever and EXAM: CHEST  2 VIEW COMPARISON:  Chest radiograph dated 03/09/2015 FINDINGS: The heart size and mediastinal contours are within normal limits. Both lungs are clear. The visualized skeletal structures are unremarkable. IMPRESSION: No active cardiopulmonary disease. Electronically Signed   By: Anner Crete M.D.   On: 07/19/2015 21:58   Ct Head Wo Contrast  07/19/2015  CLINICAL DATA:  Acute onset of generalized weakness and fall, hitting back of head. Initial encounter. EXAM: CT HEAD WITHOUT CONTRAST TECHNIQUE: Contiguous axial images were obtained from  the base of the skull through the vertex without intravenous contrast. COMPARISON:  CT of the head performed 09/27/2011 FINDINGS: There is no evidence of acute infarction, mass lesion, or intra- or extra-axial hemorrhage on CT. The posterior fossa, including the cerebellum, brainstem and fourth ventricle, is within normal limits. The third and lateral ventricles, and basal ganglia are unremarkable in appearance. The cerebral hemispheres are symmetric in appearance, with normal gray-white differentiation. No mass effect or midline shift is seen. There is no evidence of fracture; visualized osseous structures are unremarkable in appearance. The visualized portions of the orbits are within normal limits. The paranasal sinuses and mastoid air cells are well-aerated. No significant soft tissue abnormalities are seen. IMPRESSION: No evidence of traumatic intracranial injury or fracture. Electronically Signed   By: Garald Balding M.D.   On: 07/19/2015 22:23    Assessment/Plan Present on Admission:  . Sepsis ([place]) -Admit to MedSurg -Blood  cultures and urine cultures ordered and pending. No clear source of infection. Possible viral -Continue empiric IV antibiotics started in the ER, Zosyn and vancomycin pharmacy to dose.  Hypothyroidism -Stable, resume home medications  Diabetes mellitus type 2 -ADA diet, sliding-scale insulin. Stable  Peripheral neuropathy -Stable, resume home medications. 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

## 2015-07-20 DIAGNOSIS — J42 Unspecified chronic bronchitis: Secondary | ICD-10-CM | POA: Diagnosis present

## 2015-07-20 DIAGNOSIS — N400 Enlarged prostate without lower urinary tract symptoms: Secondary | ICD-10-CM | POA: Diagnosis present

## 2015-07-20 DIAGNOSIS — B349 Viral infection, unspecified: Secondary | ICD-10-CM | POA: Diagnosis present

## 2015-07-20 DIAGNOSIS — E039 Hypothyroidism, unspecified: Secondary | ICD-10-CM | POA: Diagnosis present

## 2015-07-20 DIAGNOSIS — M549 Dorsalgia, unspecified: Secondary | ICD-10-CM | POA: Diagnosis present

## 2015-07-20 DIAGNOSIS — N183 Chronic kidney disease, stage 3 (moderate): Secondary | ICD-10-CM | POA: Diagnosis present

## 2015-07-20 DIAGNOSIS — Z9889 Other specified postprocedural states: Secondary | ICD-10-CM | POA: Diagnosis not present

## 2015-07-20 DIAGNOSIS — G8929 Other chronic pain: Secondary | ICD-10-CM | POA: Diagnosis present

## 2015-07-20 DIAGNOSIS — G629 Polyneuropathy, unspecified: Secondary | ICD-10-CM | POA: Diagnosis present

## 2015-07-20 DIAGNOSIS — R6510 Systemic inflammatory response syndrome (SIRS) of non-infectious origin without acute organ dysfunction: Secondary | ICD-10-CM | POA: Diagnosis present

## 2015-07-20 DIAGNOSIS — Z87891 Personal history of nicotine dependence: Secondary | ICD-10-CM | POA: Diagnosis not present

## 2015-07-20 DIAGNOSIS — Z7982 Long term (current) use of aspirin: Secondary | ICD-10-CM | POA: Diagnosis not present

## 2015-07-20 DIAGNOSIS — A419 Sepsis, unspecified organism: Secondary | ICD-10-CM | POA: Diagnosis present

## 2015-07-20 DIAGNOSIS — Z79899 Other long term (current) drug therapy: Secondary | ICD-10-CM | POA: Diagnosis not present

## 2015-07-20 DIAGNOSIS — Z87430 Personal history of prostatic dysplasia: Secondary | ICD-10-CM | POA: Diagnosis not present

## 2015-07-20 DIAGNOSIS — I129 Hypertensive chronic kidney disease with stage 1 through stage 4 chronic kidney disease, or unspecified chronic kidney disease: Secondary | ICD-10-CM | POA: Diagnosis present

## 2015-07-20 DIAGNOSIS — E1122 Type 2 diabetes mellitus with diabetic chronic kidney disease: Secondary | ICD-10-CM | POA: Diagnosis present

## 2015-07-20 DIAGNOSIS — Z888 Allergy status to other drugs, medicaments and biological substances status: Secondary | ICD-10-CM | POA: Diagnosis not present

## 2015-07-20 DIAGNOSIS — J209 Acute bronchitis, unspecified: Secondary | ICD-10-CM | POA: Diagnosis present

## 2015-07-20 DIAGNOSIS — Z809 Family history of malignant neoplasm, unspecified: Secondary | ICD-10-CM | POA: Diagnosis not present

## 2015-07-20 LAB — BASIC METABOLIC PANEL
ANION GAP: 5 (ref 5–15)
BUN: 18 mg/dL (ref 6–20)
CHLORIDE: 108 mmol/L (ref 101–111)
CO2: 25 mmol/L (ref 22–32)
Calcium: 8.3 mg/dL — ABNORMAL LOW (ref 8.9–10.3)
Creatinine, Ser: 1.5 mg/dL — ABNORMAL HIGH (ref 0.61–1.24)
GFR calc non Af Amer: 46 mL/min — ABNORMAL LOW (ref >60)
GFR, EST AFRICAN AMERICAN: 53 mL/min — AB (ref >60)
Glucose, Bld: 158 mg/dL — ABNORMAL HIGH (ref 65–99)
Potassium: 3.4 mmol/L — ABNORMAL LOW (ref 3.5–5.1)
Sodium: 138 mmol/L (ref 135–145)

## 2015-07-20 LAB — HEPATIC FUNCTION PANEL
ALK PHOS: 61 U/L (ref 38–126)
ALT: 20 U/L (ref 17–63)
AST: 32 U/L (ref 15–41)
Albumin: 3.5 g/dL (ref 3.5–5.0)
BILIRUBIN DIRECT: 0.1 mg/dL (ref 0.1–0.5)
BILIRUBIN INDIRECT: 1.1 mg/dL — AB (ref 0.3–0.9)
Total Bilirubin: 1.2 mg/dL (ref 0.3–1.2)
Total Protein: 6.8 g/dL (ref 6.5–8.1)

## 2015-07-20 LAB — CBC
HEMATOCRIT: 34.4 % — AB (ref 40.0–52.0)
HEMOGLOBIN: 11.4 g/dL — AB (ref 13.0–18.0)
MCH: 28.2 pg (ref 26.0–34.0)
MCHC: 33 g/dL (ref 32.0–36.0)
MCV: 85.5 fL (ref 80.0–100.0)
Platelets: 198 10*3/uL (ref 150–440)
RBC: 4.03 MIL/uL — AB (ref 4.40–5.90)
RDW: 15.8 % — ABNORMAL HIGH (ref 11.5–14.5)
WBC: 16.5 10*3/uL — ABNORMAL HIGH (ref 3.8–10.6)

## 2015-07-20 LAB — SEDIMENTATION RATE: Sed Rate: 36 mm/hr — ABNORMAL HIGH (ref 0–20)

## 2015-07-20 LAB — LACTIC ACID, PLASMA: LACTIC ACID, VENOUS: 1.6 mmol/L (ref 0.5–2.0)

## 2015-07-20 MED ORDER — AMLODIPINE BESYLATE 10 MG PO TABS
10.0000 mg | ORAL_TABLET | Freq: Every day | ORAL | Status: DC
  Administered 2015-07-20: 10 mg via ORAL
  Filled 2015-07-20: qty 1

## 2015-07-20 MED ORDER — SODIUM CHLORIDE 0.9 % IV BOLUS (SEPSIS)
1000.0000 mL | Freq: Once | INTRAVENOUS | Status: AC
  Administered 2015-07-20: 1000 mL via INTRAVENOUS

## 2015-07-20 MED ORDER — PIPERACILLIN-TAZOBACTAM 3.375 G IVPB
3.3750 g | Freq: Three times a day (TID) | INTRAVENOUS | Status: DC
  Administered 2015-07-20 – 2015-07-21 (×4): 3.375 g via INTRAVENOUS
  Filled 2015-07-20 (×6): qty 50

## 2015-07-20 MED ORDER — LISINOPRIL 20 MG PO TABS
20.0000 mg | ORAL_TABLET | Freq: Every day | ORAL | Status: DC
  Administered 2015-07-20: 20 mg via ORAL
  Filled 2015-07-20: qty 1

## 2015-07-20 MED ORDER — ASPIRIN 325 MG PO TABS
325.0000 mg | ORAL_TABLET | Freq: Every day | ORAL | Status: DC
  Administered 2015-07-20 – 2015-07-21 (×2): 325 mg via ORAL
  Filled 2015-07-20 (×2): qty 1

## 2015-07-20 MED ORDER — FLUTICASONE PROPIONATE 50 MCG/ACT NA SUSP
2.0000 | Freq: Every day | NASAL | Status: DC
  Administered 2015-07-20 – 2015-07-21 (×2): 2 via NASAL
  Filled 2015-07-20: qty 16

## 2015-07-20 MED ORDER — SPIRONOLACTONE 25 MG PO TABS
100.0000 mg | ORAL_TABLET | Freq: Every day | ORAL | Status: DC
  Administered 2015-07-20 – 2015-07-21 (×2): 100 mg via ORAL
  Filled 2015-07-20 (×3): qty 4

## 2015-07-20 MED ORDER — OXYCODONE HCL 5 MG PO TABS
10.0000 mg | ORAL_TABLET | ORAL | Status: DC | PRN
  Administered 2015-07-20 – 2015-07-21 (×4): 10 mg via ORAL
  Filled 2015-07-20 (×2): qty 2
  Filled 2015-07-20: qty 1
  Filled 2015-07-20 (×2): qty 2

## 2015-07-20 MED ORDER — FINASTERIDE 5 MG PO TABS
5.0000 mg | ORAL_TABLET | Freq: Every day | ORAL | Status: DC
  Administered 2015-07-20 – 2015-07-21 (×2): 5 mg via ORAL
  Filled 2015-07-20 (×2): qty 1

## 2015-07-20 MED ORDER — METOPROLOL SUCCINATE ER 50 MG PO TB24
100.0000 mg | ORAL_TABLET | Freq: Every day | ORAL | Status: DC
  Administered 2015-07-20: 100 mg via ORAL
  Filled 2015-07-20: qty 2

## 2015-07-20 MED ORDER — LEVOTHYROXINE SODIUM 25 MCG PO TABS
175.0000 ug | ORAL_TABLET | Freq: Every day | ORAL | Status: DC
  Administered 2015-07-21: 175 ug via ORAL
  Filled 2015-07-20: qty 1

## 2015-07-20 MED ORDER — TAMSULOSIN HCL 0.4 MG PO CAPS
0.4000 mg | ORAL_CAPSULE | Freq: Every day | ORAL | Status: DC
  Administered 2015-07-20 – 2015-07-21 (×2): 0.4 mg via ORAL
  Filled 2015-07-20 (×3): qty 1

## 2015-07-20 MED ORDER — METHOCARBAMOL 500 MG PO TABS: 750.0000 mg | ORAL_TABLET | Freq: Three times a day (TID) | ORAL | Status: DC | PRN

## 2015-07-20 MED ORDER — ACETAMINOPHEN 650 MG RE SUPP: 650.0000 mg | Freq: Four times a day (QID) | RECTAL | Status: DC | PRN

## 2015-07-20 MED ORDER — GABAPENTIN 300 MG PO CAPS
300.0000 mg | ORAL_CAPSULE | Freq: Every day | ORAL | Status: DC
  Administered 2015-07-20 (×2): 300 mg via ORAL
  Filled 2015-07-20 (×2): qty 1

## 2015-07-20 MED ORDER — ETODOLAC 300 MG PO CAPS
500.0000 mg | ORAL_CAPSULE | Freq: Two times a day (BID) | ORAL | Status: DC
  Administered 2015-07-20 – 2015-07-21 (×2): 500 mg via ORAL
  Filled 2015-07-20 (×4): qty 1

## 2015-07-20 MED ORDER — ETODOLAC 300 MG PO CAPS
500.0000 mg | ORAL_CAPSULE | Freq: Two times a day (BID) | ORAL | Status: DC
  Administered 2015-07-20: 500 mg via ORAL
  Filled 2015-07-20 (×3): qty 1

## 2015-07-20 MED ORDER — VANCOMYCIN HCL IN DEXTROSE 1-5 GM/200ML-% IV SOLN
1000.0000 mg | Freq: Two times a day (BID) | INTRAVENOUS | Status: DC
  Administered 2015-07-20 – 2015-07-21 (×3): 1000 mg via INTRAVENOUS
  Filled 2015-07-20 (×4): qty 200

## 2015-07-20 MED ORDER — HYDROCODONE-ACETAMINOPHEN 7.5-325 MG PO TABS
1.0000 | ORAL_TABLET | Freq: Four times a day (QID) | ORAL | Status: DC | PRN
  Administered 2015-07-20 (×2): 2 via ORAL
  Filled 2015-07-20 (×2): qty 2

## 2015-07-20 MED ORDER — ONDANSETRON HCL 4 MG/2ML IJ SOLN
4.0000 mg | Freq: Four times a day (QID) | INTRAMUSCULAR | Status: DC | PRN
  Administered 2015-07-20: 4 mg via INTRAVENOUS
  Filled 2015-07-20: qty 2

## 2015-07-20 MED ORDER — ONDANSETRON HCL 4 MG PO TABS: 4.0000 mg | ORAL_TABLET | Freq: Four times a day (QID) | ORAL | Status: DC | PRN

## 2015-07-20 MED ORDER — ACETAMINOPHEN 325 MG PO TABS: 650.0000 mg | ORAL_TABLET | Freq: Four times a day (QID) | ORAL | Status: DC | PRN

## 2015-07-20 MED ORDER — HEPARIN SODIUM (PORCINE) 5000 UNIT/ML IJ SOLN
5000.0000 [IU] | Freq: Three times a day (TID) | INTRAMUSCULAR | Status: DC
  Administered 2015-07-20 – 2015-07-21 (×5): 5000 [IU] via SUBCUTANEOUS
  Filled 2015-07-20 (×5): qty 1

## 2015-07-20 MED ORDER — HYDROCHLOROTHIAZIDE 12.5 MG PO CAPS
12.5000 mg | ORAL_CAPSULE | Freq: Every day | ORAL | Status: DC
  Administered 2015-07-20: 12.5 mg via ORAL
  Filled 2015-07-20: qty 1

## 2015-07-20 MED ORDER — SENNOSIDES-DOCUSATE SODIUM 8.6-50 MG PO TABS: 1.0000 | ORAL_TABLET | Freq: Every evening | ORAL | Status: DC | PRN

## 2015-07-20 MED ORDER — LISINOPRIL-HYDROCHLOROTHIAZIDE 20-12.5 MG PO TABS: 1.0000 | ORAL_TABLET | Freq: Every day | ORAL | Status: DC

## 2015-07-20 NOTE — Progress Notes (Deleted)
p 

## 2015-07-20 NOTE — Progress Notes (Signed)
[place] at [facility]                                                                                                                                                                                            Patient Demographics   Paul Briggs, is a 70 y.o. male, DOB - Jun 25, 1945, [id number]  Admit date - 07/19/2015   Admitting Physician Quintella Baton, MD  Outpatient Primary MD for the patient is MASOUD,JAVED, MD   LOS -   Subjective: Patient feeling weak, complains of chronic cough and nasal congestion and some headache. Otherwise denies any abdominal pain nausea vomiting diarrhea denies any urinary signs or symptoms    Review of Systems:   CONSTITUTIONAL: Fever yesterday No fatigue, positive weakness. No weight gain, no weight loss.  EYES: No blurry or double vision.  ENT: No tinnitus. No postnasal drip. No redness of the oropharynx.  RESPIRATORY: No cough, no wheeze, no hemoptysis. No dyspnea.  CARDIOVASCULAR: No chest pain. No orthopnea. No palpitations. No syncope.  GASTROINTESTINAL: No nausea, no vomiting or diarrhea. No abdominal pain. No melena or hematochezia.  GENITOURINARY: No dysuria or hematuria.  ENDOCRINE: No polyuria or nocturia. No heat or cold intolerance.  HEMATOLOGY: No anemia. No bruising. No bleeding.  INTEGUMENTARY: No rashes. No lesions.  MUSCULOSKELETAL: No arthritis. No swelling. No gout.  NEUROLOGIC: No numbness, tingling, or ataxia. No seizure-type activity.  PSYCHIATRIC: No anxiety. No insomnia. No ADD.    Vitals:   Filed Vitals:   07/20/15 0033 07/20/15 0141 07/20/15 0409 07/20/15 0803  BP:   156/77 134/66  Pulse:   85 85  Temp:  101.3 F (38.5 C) 99.1 F (37.3 C) 99.9 F (37.7 C)  TempSrc:  Oral Oral Oral  Resp:   18 16  Height:      Weight: 106.323 kg (234 lb 6.4 oz)     SpO2:   98% 96%    Wt Readings from Last 3 Encounters:  07/20/15 106.323 kg (234 lb 6.4 oz)  06/02/15 110.133 kg (242  lb 12.8 oz)  03/25/15 105.733 kg (233 lb 1.6 oz)     Intake/Output Summary (Last 24 hours) at 07/20/15 1219 Last data filed at 07/20/15 0830  Gross per 24 hour  Intake    370 ml  Output    775 ml  Net   -405 ml    Physical Exam:   GENERAL: Pleasant-appearing in no apparent distress.  HEAD, EYES, EARS, NOSE AND THROAT: Atraumatic, normocephalic. Extraocular muscles are intact. Pupils equal and reactive to light. Sclerae anicteric. No conjunctival injection. No oro-pharyngeal erythema.  NECK: Supple.  There is no jugular venous distention. No bruits, no lymphadenopathy, no thyromegaly.  HEART: Regular rate and rhythm,. No murmurs, no rubs, no clicks.  LUNGS: Clear to auscultation bilaterally. No rales or rhonchi. No wheezes.  ABDOMEN: Soft, flat, nontender, nondistended. Has good bowel sounds. No hepatosplenomegaly appreciated.  EXTREMITIES: No evidence of any cyanosis, clubbing, or peripheral edema.  +2 pedal and radial pulses bilaterally.  NEUROLOGIC: The patient is alert, awake, and oriented x3 with no focal motor or sensory deficits appreciated bilaterally.  SKIN: Moist and warm with no rashes appreciated.  Psych: Not anxious, depressed LN: No inguinal LN enlargement    Antibiotics   Anti-infectives    Start     Dose/Rate Route Frequency Ordered Stop   07/20/15 0600  piperacillin-tazobactam (ZOSYN) IVPB 3.375 g     3.375 g 12.5 mL/hr over 240 Minutes Intravenous Every 8 hours 07/20/15 0011     07/20/15 0400  vancomycin (VANCOCIN) IVPB 1000 mg/200 mL premix     1,000 mg 200 mL/hr over 60 Minutes Intravenous Every 12 hours 07/20/15 0011     07/19/15 2130  vancomycin (VANCOCIN) IVPB 1000 mg/200 mL premix     1,000 mg 200 mL/hr over 60 Minutes Intravenous  Once 07/19/15 2119 07/19/15 2223   07/19/15 2130  piperacillin-tazobactam (ZOSYN) IVPB 3.375 g     3.375 g 12.5 mL/hr over 240 Minutes Intravenous  Once 07/19/15 2119 07/19/15 2202      Medications   Scheduled Meds: .  amLODipine  10 mg Oral Daily  . aspirin  325 mg Oral Daily  . etodolac  500 mg Oral BID WC  . finasteride  5 mg Oral Daily  . fluticasone  2 spray Each Nare Daily  . gabapentin  300 mg Oral QHS  . heparin  5,000 Units Subcutaneous Q8H  . lisinopril  20 mg Oral Daily   And  . hydrochlorothiazide  12.5 mg Oral Daily  . levothyroxine  175 mcg Oral QAC breakfast  . metoprolol succinate  100 mg Oral Daily  . piperacillin-tazobactam (ZOSYN)  IV  3.375 g Intravenous Q8H  . spironolactone  100 mg Oral Daily  . tamsulosin  0.4 mg Oral Daily  . vancomycin  1,000 mg Intravenous Q12H   Continuous Infusions:  PRN Meds:.acetaminophen **OR** acetaminophen, methocarbamol, ondansetron **OR** ondansetron (ZOFRAN) IV, oxyCODONE, senna-docusate   Data Review:   Micro Results Recent Results (from the past 240 hour(s))  Culture, blood (Routine x 2)     Status: None (Preliminary result)   Collection Time: 07/19/15  9:05 PM  Result Value Ref Range Status   Specimen Description BLOOD RIGHT ASSIST CONTROL  Final   Special Requests BOTTLES DRAWN AEROBIC AND ANAEROBIC 7 CC  Final   Culture NO GROWTH < 12 HOURS  Final   Report Status PENDING  Incomplete  Culture, blood (Routine x 2)     Status: None (Preliminary result)   Collection Time: 07/19/15  9:05 PM  Result Value Ref Range Status   Specimen Description BLOOD LEFT ARM  Final   Special Requests BOTTLES DRAWN AEROBIC AND ANAEROBIC 5 CC  Final   Culture NO GROWTH < 12 HOURS  Final   Report Status PENDING  Incomplete    Radiology Reports Dg Chest 2 View  07/19/2015  CLINICAL DATA:  85 old male with fever and EXAM: CHEST  2 VIEW COMPARISON:  Chest radiograph dated 03/09/2015 FINDINGS: The heart size and mediastinal contours are within normal limits. Both lungs are clear. The visualized skeletal  structures are unremarkable. IMPRESSION: No active cardiopulmonary disease. Electronically Signed   By: Anner Crete M.D.   On: 07/19/2015 21:58    Ct Head Wo Contrast  07/19/2015  CLINICAL DATA:  Acute onset of generalized weakness and fall, hitting back of head. Initial encounter. EXAM: CT HEAD WITHOUT CONTRAST TECHNIQUE: Contiguous axial images were obtained from the base of the skull through the vertex without intravenous contrast. COMPARISON:  CT of the head performed 09/27/2011 FINDINGS: There is no evidence of acute infarction, mass lesion, or intra- or extra-axial hemorrhage on CT. The posterior fossa, including the cerebellum, brainstem and fourth ventricle, is within normal limits. The third and lateral ventricles, and basal ganglia are unremarkable in appearance. The cerebral hemispheres are symmetric in appearance, with normal gray-white differentiation. No mass effect or midline shift is seen. There is no evidence of fracture; visualized osseous structures are unremarkable in appearance. The visualized portions of the orbits are within normal limits. The paranasal sinuses and mastoid air cells are well-aerated. No significant soft tissue abnormalities are seen. IMPRESSION: No evidence of traumatic intracranial injury or fracture. Electronically Signed   By: Garald Balding M.D.   On: 07/19/2015 22:23     CBC  Recent Labs Lab 07/19/15 2100 07/20/15 0539  WBC 15.0* 16.5*  HGB 13.1 11.4*  HCT 39.8* 34.4*  PLT 220 198  MCV 86.1 85.5  MCH 28.3 28.2  MCHC 32.9 33.0  RDW 15.3* 15.8*  LYMPHSABS 1.2  --   MONOABS 0.9  --   EOSABS 0.0  --   BASOSABS 0.1  --     Chemistries   Recent Labs Lab 07/19/15 2100 07/20/15 0539  NA 138 138  K 3.7 3.4*  CL 103 108  CO2 23 25  GLUCOSE 150* 158*  BUN 19 18  CREATININE 1.46* 1.50*  CALCIUM 9.6 8.3*  AST 39 32  ALT 26 20  ALKPHOS 79 61  BILITOT 1.4* 1.2   ------------------------------------------------------------------------------------------------------------------ estimated creatinine clearance is 59.5 mL/min (by C-G formula based on Cr of  1.5). ------------------------------------------------------------------------------------------------------------------ No results for input(s): HGBA1C in the last 72 hours. ------------------------------------------------------------------------------------------------------------------ No results for input(s): CHOL, HDL, LDLCALC, TRIG, CHOLHDL, LDLDIRECT in the last 72 hours. ------------------------------------------------------------------------------------------------------------------ No results for input(s): TSH, T4TOTAL, T3FREE, THYROIDAB in the last 72 hours.  Invalid input(s): FREET3 ------------------------------------------------------------------------------------------------------------------ No results for input(s): VITAMINB12, FOLATE, FERRITIN, TIBC, IRON, RETICCTPCT in the last 72 hours.  Coagulation profile No results for input(s): INR, PROTIME in the last 168 hours.  No results for input(s): DDIMER in the last 72 hours.  Cardiac Enzymes  Recent Labs Lab 07/19/15 2100  TROPONINI <0.03   ------------------------------------------------------------------------------------------------------------------ Invalid input(s): POCBNP    Assessment & Plan   Patient is a 70 year old admitted with fever and fall   Assessment/Plan Present on Admission:  . Sepsis ([place]) Unclear source Blood cultures negative to date follow tomorrow Urinalysis without evidence of urine infection Chest x-ray negative WBC count the worse today. 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           Consults None DVT Prophylaxis  heparin  Lab Results  Component Value Date   PLT 198 07/20/2015     Time Spent in minutes   59min  Greater than 50% of time spent in care coordination and counseling patient regarding the condition and plan of care.   Dustin Flock M.D on 07/20/2015 at 12:19 PM  Between 7am to 6pm - Pager - [phone]  After 6pm go to [email or web address] - password [email or web address] [facility]   Office  [phone]

## 2015-07-20 NOTE — Progress Notes (Signed)
Pts BP 88/50 HR 50. MD Posey Pronto notified orders received to d/c all BP meds and give 1L NS fluid bolus. Orders placed.

## 2015-07-20 NOTE — Care Management Obs Status (Signed)
[place] NOTIFICATION   Patient Details  Name: Paul Garbers [name] MRN: [id number] Date of Birth: [birth year]   Medicare Observation Status Notification Given:   CM explained that he was in an Obs status when admitted but was changed to an IP status after further review today.     Delrae Sawyers, RN 07/20/2015, 6:55 PM

## 2015-07-21 LAB — BASIC METABOLIC PANEL WITH GFR
Anion gap: 6 (ref 5–15)
BUN: 22 mg/dL — ABNORMAL HIGH (ref 6–20)
CO2: 24 mmol/L (ref 22–32)
Calcium: 8 mg/dL — ABNORMAL LOW (ref 8.9–10.3)
Chloride: 106 mmol/L (ref 101–111)
Creatinine, Ser: 1.41 mg/dL — ABNORMAL HIGH (ref 0.61–1.24)
GFR calc Af Amer: 57 mL/min — ABNORMAL LOW
GFR calc non Af Amer: 49 mL/min — ABNORMAL LOW
Glucose, Bld: 137 mg/dL — ABNORMAL HIGH (ref 65–99)
Potassium: 3.5 mmol/L (ref 3.5–5.1)
Sodium: 136 mmol/L (ref 135–145)

## 2015-07-21 LAB — URINE CULTURE: Culture: NO GROWTH

## 2015-07-21 LAB — CBC
HCT: 31.3 % — ABNORMAL LOW (ref 40.0–52.0)
HEMOGLOBIN: 10.7 g/dL — AB (ref 13.0–18.0)
MCH: 29.3 pg (ref 26.0–34.0)
MCHC: 34.2 g/dL (ref 32.0–36.0)
MCV: 85.6 fL (ref 80.0–100.0)
Platelets: 164 10*3/uL (ref 150–440)
RBC: 3.66 MIL/uL — AB (ref 4.40–5.90)
RDW: 15.6 % — ABNORMAL HIGH (ref 11.5–14.5)
WBC: 8 10*3/uL (ref 3.8–10.6)

## 2015-07-21 MED ORDER — METOPROLOL SUCCINATE ER 25 MG PO TB24: 25.0000 mg | ORAL_TABLET | Freq: Every day | ORAL | Status: DC

## 2015-07-21 MED ORDER — AMOXICILLIN-POT CLAVULANATE 875-125 MG PO TABS: 1.0000 | ORAL_TABLET | Freq: Two times a day (BID) | ORAL | Status: DC

## 2015-07-21 MED ORDER — ACETAMINOPHEN 650 MG RE SUPP: 650.0000 mg | Freq: Four times a day (QID) | RECTAL | Status: DC | PRN

## 2015-07-21 MED ORDER — AMLODIPINE BESYLATE 2.5 MG PO TABS: 2.5000 mg | ORAL_TABLET | Freq: Every day | ORAL | Status: DC

## 2015-07-21 NOTE — Care Management (Signed)
No identified RNCM needs. PCP is Dr. Lavera Guise.

## 2015-07-21 NOTE — Evaluation (Signed)
Physical Therapy Evaluation Patient Details Name: Paul Piwowar [name] MRN: [id number] DOB: 03-May-1945 Today's Date: 07/21/2015   History of Present Illness  This is a 70 year old male who today he woke up and felt very weak. As he continued to do to his daily activities he felt bad and became profoundly weak. He denies any nausea, vomiting, diarrhea, shortness of breath, cough, burning urination, fever, chills or altered mentation. He fell, secondary to his weakness. He fell backwards and hit the back of his head. There was no loss of consciousness.Marland Kitchen His are brought him to the ER. History provided by the patient as well as his wife present at bedside. In the ER he was febrile with a temperature of 103.1. 2-3 falls in the last 12 months. Pt reports that at time of PT evaluation he feels improved  Clinical Impression  Pt is independent at baseline, drives and works in [employer]. He is currently back to his baseline level of function. UE/LE strength WFL. Pt ambulates full lap around RN station without assistive device and no deviation. He is able to perform head turns and gait speed changes without instability. Single leg stance 9 seconds. No PT needs identified at this time. Will complete order. Please enter new order if status or needs change.     Follow Up Recommendations No PT follow up    Equipment Recommendations  None recommended by PT    Recommendations for Other Services       Precautions / Restrictions Precautions Precautions: Fall Restrictions Weight Bearing Restrictions: No      Mobility  Bed Mobility Overal bed mobility: Independent             General bed mobility comments: Good speed/sequencing  Transfers Overall transfer level: Independent               General transfer comment: No signs for instability. Good speed/sequencing noted  Ambulation/Gait Ambulation/Gait assistance: Independent Ambulation Distance (Feet): 220 Feet   Gait  Pattern/deviations: WFL(Within Functional Limits) Gait velocity: WFL Gait velocity interpretation: >2.62 ft/sec, indicative of independent community ambulator General Gait Details: Pt demonstrates good speed and step length with ambualtion. Able to perform horizontal and vertical head turns as well as gait speed changes without lateral gait deviations. Pt denies DOE. He reports that ambulation is currently baseline  Financial trader Rankin (Stroke Patients Only)       Balance Overall balance assessment: Independent Sitting-balance support: No upper extremity supported Sitting balance-Leahy Scale: Normal     Standing balance support: No upper extremity supported Standing balance-Leahy Scale: Normal Standing balance comment: Negative [place]   Single Leg Stance - Left Leg: 9                         Pertinent Vitals/Pain Pain Assessment: No/denies pain    Home Living Family/patient expects to be discharged to:: Private residence Living Arrangements: Spouse/significant other Available Help at Discharge: Family Type of Home: House Home Access: Level entry     Home Layout: Multi-level;Able to live on main level with bedroom/bathroom Home Equipment: Gilford Rile - 2 wheels;Cane - single point;Shower seat;Bedside commode;Grab bars - tub/shower      Prior Function Level of Independence: Independent         Comments: 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 Full community ambulator without assistive device     Hand Dominance  Dominant Hand: Right    Extremity/Trunk Assessment   Upper Extremity Assessment: Overall WFL for tasks assessed           Lower Extremity Assessment: Overall WFL for tasks assessed         Communication   Communication: No difficulties  Cognition Arousal/Alertness: Awake/alert Behavior During Therapy: WFL for tasks assessed/performed Overall Cognitive Status: Within Functional  Limits for tasks assessed                      General Comments      Exercises        Assessment/Plan    PT Assessment Patent does not need any further PT services  PT Diagnosis     PT Problem List    PT Treatment Interventions     PT Goals (Current goals can be found in the Care Plan section) Acute Rehab PT Goals PT Goal Formulation: All assessment and education complete, DC therapy    Frequency     Barriers to discharge        Co-evaluation               End of Session Equipment Utilized During Treatment: Gait belt Activity Tolerance: Patient tolerated treatment well Patient left: in bed;with call bell/phone within reach;with bed alarm set Nurse Communication: Mobility status         Time: 1000-1015 PT Time Calculation (min) (ACUTE ONLY): 15 min   Charges:   PT Evaluation $PT Eval Low Complexity: 1 Procedure     PT G Codes:       Lyndel Safe Paul Briggs PT, DPT   Paul Briggs,Paul Briggs 07/21/2015, 10:46 AM

## 2015-07-21 NOTE — Progress Notes (Signed)
Patient is discharged home with wife via wheelchair.  IV removed with cath intact. Reviewed meds and last dose given. Follow-up appointment discussed.  Allow time for question. Gave bin meds to patient's wife

## 2015-07-23 NOTE — Progress Notes (Signed)
[place] [facility]                                                                                                                                                                                            Patient Demographics   Paul Briggs, is a 70 y.o. male, DOB - 30-Oct-1945, [id number]  Admit date - 07/19/2015   Admitting Physician Quintella Baton, MD  Outpatient Primary MD for the patient is MASOUD,JAVED, MD   LOS - 1  Subjective: stable for dischargeafebrile.cultures negative to date  Review of Systems:   CONSTITUTIONAL: Fever yesterday No fatigue, positive weakness. No weight gain, no weight loss.  EYES: No blurry or double vision.  ENT: No tinnitus. No postnasal drip. No redness of the oropharynx.  RESPIRATORY: No cough, no wheeze, no hemoptysis. No dyspnea.  CARDIOVASCULAR: No chest pain. No orthopnea. No palpitations. No syncope.  GASTROINTESTINAL: No nausea, no vomiting or diarrhea. No abdominal pain. No melena or hematochezia.  GENITOURINARY: No dysuria or hematuria.  ENDOCRINE: No polyuria or nocturia. No heat or cold intolerance.  HEMATOLOGY: No anemia. No bruising. No bleeding.  INTEGUMENTARY: No rashes. No lesions.  MUSCULOSKELETAL: No arthritis. No swelling. No gout.  NEUROLOGIC: No numbness, tingling, or ataxia. No seizure-type activity.  PSYCHIATRIC: No anxiety. No insomnia. No ADD.    Vitals:   Filed Vitals:   07/20/15 2002 07/21/15 0527 07/21/15 0738 07/21/15 1152  BP: 100/55 114/63 117/61 107/59  Pulse: 52 50 54   Temp: 97.8 F (36.6 C) 97.7 F (36.5 C) 97.8 F (36.6 C) 98 F (36.7 C)  TempSrc: Oral Oral Oral Oral  Resp: 19 19 18 18   Height:      Weight:      SpO2: 96% 96% 95% 95%    Wt Readings from Last 3 Encounters:  07/20/15 106.323 kg (234 lb 6.4 oz)  06/02/15 110.133 kg (242 lb 12.8 oz)  03/25/15 105.733 kg (233 lb 1.6 oz)    No intake or output data in the 24 hours ending 07/23/15  2241  Physical Exam:   GENERAL: Pleasant-appearing in no apparent distress.  HEAD, EYES, EARS, NOSE AND THROAT: Atraumatic, normocephalic. Extraocular muscles are intact. Pupils equal and reactive to light. Sclerae anicteric. No conjunctival injection. No oro-pharyngeal erythema.  NECK: Supple. There is no jugular venous distention. No bruits, no lymphadenopathy, no thyromegaly.  HEART: Regular rate and rhythm,. No murmurs, no rubs, no clicks.  LUNGS: Clear to auscultation bilaterally. No rales or rhonchi. No wheezes.  ABDOMEN: Soft, flat, nontender, nondistended. Has good bowel sounds. No hepatosplenomegaly appreciated.  EXTREMITIES: No evidence of any  cyanosis, clubbing, or peripheral edema.  +2 pedal and radial pulses bilaterally.  NEUROLOGIC: The patient is alert, awake, and oriented x3 with no focal motor or sensory deficits appreciated bilaterally.  SKIN: Moist and warm with no rashes appreciated.  Psych: Not anxious, depressed LN: No inguinal LN enlargement    Antibiotics   Anti-infectives    Start     Dose/Rate Route Frequency Ordered Stop   07/21/15 0000  amoxicillin-clavulanate (AUGMENTIN) 875-125 MG tablet     1 tablet Oral 2 times daily 07/21/15 1052     07/20/15 0600  piperacillin-tazobactam (ZOSYN) IVPB 3.375 g  Status:  Discontinued     3.375 g 12.5 mL/hr over 240 Minutes Intravenous Every 8 hours 07/20/15 0011 07/21/15 1757   07/20/15 0400  vancomycin (VANCOCIN) IVPB 1000 mg/200 mL premix  Status:  Discontinued     1,000 mg 200 mL/hr over 60 Minutes Intravenous Every 12 hours 07/20/15 0011 07/21/15 1757   07/19/15 2130  vancomycin (VANCOCIN) IVPB 1000 mg/200 mL premix     1,000 mg 200 mL/hr over 60 Minutes Intravenous  Once 07/19/15 2119 07/19/15 2223   07/19/15 2130  piperacillin-tazobactam (ZOSYN) IVPB 3.375 g     3.375 g 12.5 mL/hr over 240 Minutes Intravenous  Once 07/19/15 2119 07/19/15 2202      Medications   Scheduled Meds:  Continuous Infusions: PRN  Meds:.   Data Review:   Micro Results Recent Results (from the past 240 hour(s))  Culture, blood (Routine x 2)     Status: None (Preliminary result)   Collection Time: 07/19/15  9:05 PM  Result Value Ref Range Status   Specimen Description BLOOD RIGHT ASSIST CONTROL  Final   Special Requests BOTTLES DRAWN AEROBIC AND ANAEROBIC 7 CC  Final   Culture NO GROWTH 4 DAYS  Final   Report Status PENDING  Incomplete  Culture, blood (Routine x 2)     Status: None (Preliminary result)   Collection Time: 07/19/15  9:05 PM  Result Value Ref Range Status   Specimen Description BLOOD LEFT ARM  Final   Special Requests BOTTLES DRAWN AEROBIC AND ANAEROBIC 5 CC  Final   Culture NO GROWTH 4 DAYS  Final   Report Status PENDING  Incomplete  Urine culture     Status: None   Collection Time: 07/19/15 10:40 PM  Result Value Ref Range Status   Specimen Description URINE, CLEAN CATCH  Final   Special Requests NONE  Final   Culture NO GROWTH Performed at [facility]   Final   Report Status 07/21/2015 FINAL  Final    Radiology Reports Dg Chest 2 View  07/19/2015  CLINICAL DATA:  87 old male with fever and EXAM: CHEST  2 VIEW COMPARISON:  Chest radiograph dated 03/09/2015 FINDINGS: The heart size and mediastinal contours are within normal limits. Both lungs are clear. The visualized skeletal structures are unremarkable. IMPRESSION: No active cardiopulmonary disease. Electronically Signed   By: Anner Crete M.D.   On: 07/19/2015 21:58   Ct Head Wo Contrast  07/19/2015  CLINICAL DATA:  Acute onset of generalized weakness and fall, hitting back of head. Initial encounter. EXAM: CT HEAD WITHOUT CONTRAST TECHNIQUE: Contiguous axial images were obtained from the base of the skull through the vertex without intravenous contrast. COMPARISON:  CT of the head performed 09/27/2011 FINDINGS: There is no evidence of acute infarction, mass lesion, or intra- or extra-axial hemorrhage on CT. The  posterior fossa, including the cerebellum, brainstem and fourth ventricle, is  within normal limits. The third and lateral ventricles, and basal ganglia are unremarkable in appearance. The cerebral hemispheres are symmetric in appearance, with normal gray-white differentiation. No mass effect or midline shift is seen. There is no evidence of fracture; visualized osseous structures are unremarkable in appearance. The visualized portions of the orbits are within normal limits. The paranasal sinuses and mastoid air cells are well-aerated. No significant soft tissue abnormalities are seen. IMPRESSION: No evidence of traumatic intracranial injury or fracture. Electronically Signed   By: Garald Balding M.D.   On: 07/19/2015 22:23     CBC  Recent Labs Lab 07/19/15 2100 07/20/15 0539 07/21/15 0437  WBC 15.0* 16.5* 8.0  HGB 13.1 11.4* 10.7*  HCT 39.8* 34.4* 31.3*  PLT 220 198 164  MCV 86.1 85.5 85.6  MCH 28.3 28.2 29.3  MCHC 32.9 33.0 34.2  RDW 15.3* 15.8* 15.6*  LYMPHSABS 1.2  --   --   MONOABS 0.9  --   --   EOSABS 0.0  --   --   BASOSABS 0.1  --   --     Chemistries   Recent Labs Lab 07/19/15 2100 07/20/15 0539 07/21/15 0437  NA 138 138 136  K 3.7 3.4* 3.5  CL 103 108 106  CO2 23 25 24   GLUCOSE 150* 158* 137*  BUN 19 18 22*  CREATININE 1.46* 1.50* 1.41*  CALCIUM 9.6 8.3* 8.0*  AST 39 32  --   ALT 26 20  --   ALKPHOS 79 61  --   BILITOT 1.4* 1.2  --    ------------------------------------------------------------------------------------------------------------------ estimated creatinine clearance is 63.3 mL/min (by C-G formula based on Cr of 1.41). ------------------------------------------------------------------------------------------------------------------ No results for input(s): HGBA1C in the last 72 hours. ------------------------------------------------------------------------------------------------------------------ No results for input(s): CHOL, HDL, LDLCALC, TRIG,  CHOLHDL, LDLDIRECT in the last 72 hours. ------------------------------------------------------------------------------------------------------------------ No results for input(s): TSH, T4TOTAL, T3FREE, THYROIDAB in the last 72 hours.  Invalid input(s): FREET3 ------------------------------------------------------------------------------------------------------------------ No results for input(s): VITAMINB12, FOLATE, FERRITIN, TIBC, IRON, RETICCTPCT in the last 72 hours.  Coagulation profile No results for input(s): INR, PROTIME in the last 168 hours.  No results for input(s): DDIMER in the last 72 hours.  Cardiac Enzymes  Recent Labs Lab 07/19/15 2100  TROPONINI <0.03   ------------------------------------------------------------------------------------------------------------------ Invalid input(s): POCBNP    Assessment & Plan   Patient is a 70 year old admitted with fever and fall   Assessment/Plan Present on Admission:  . 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     Discharge home 6/26.      Consults None DVT Prophylaxis  heparin  Lab Results  Component Value Date  PLT 164 07/21/2015     Time Spent in minutes   51min  Greater than 50% of time spent in care coordination and counseling patient regarding the condition and plan of care.   Epifanio Lesches M.D on 07/21/2015 at 10:41 PM  Between 7am to 6pm - Pager - [phone]  After 6pm go to [email or web address] - password [email or web address] [facility]   Office  [phone]

## 2015-07-24 LAB — CULTURE, BLOOD (ROUTINE X 2)
CULTURE: NO GROWTH
Culture: NO GROWTH

## 2015-07-24 NOTE — Discharge Summary (Signed)
Paul Nest [name], is a 70 y.o. male  DOB 12-Jul-1945  MRN [id number].  Admission date:  07/19/2015  Admitting Physician  Quintella Baton, MD  Discharge Date:  07/21/2015   Primary MD  Cletis Athens, MD  Recommendations for primary care physician for things to follow:  Follow-up with primary doctor in 1 week   Admission Diagnosis  SIRS (systemic inflammatory response syndrome) (HCC) [R65.10] Generalized weakness [R53.1]   Discharge Diagnosis  SIRS (systemic inflammatory response syndrome) (HCC) [R65.10] Generalized weakness [R53.1]   Principal Problem:   Sepsis ([place]) Active Problems:   HTN (hypertension)   Diabetes mellitus (HCC)   Hypothyroidism   Peripheral neuropathy (HCC)   Chronic back pain   CKD (chronic kidney disease) stage 3, GFR 30-59 ml/min      Past Medical History  Diagnosis Date  . Testicle tenderness   . Erectile dysfunction   . Dysplasia of prostate   . Rectum pain   . Palindromic rheumatism, hand   . Chronic kidney disease   . Testicle swelling   . Hypogonadism in male   . BPH (benign prostatic hyperplasia)   . Prostatitis   . Over weight   . Testicular mass   . Acute prostatitis   . HTN (hypertension)   . Orchitis and epididymitis     Past Surgical History  Procedure Laterality Date  . Back surgery         History of present illness and  Hospital Course:     Kindly see H&P for history of present illness and admission details, please review complete Labs, Consult reports and Test reports for all details in brief  HPI  from the history and physical done on the day of admission 70 year old male patient of admitted for generalized weakness, fever up to 103 Fahrenheit. Not have any cough, shortness of breath, nausea, vomiting, abdominal pain. Admitted for sepsis of unclear  etiology.   Hospital Course  #1 sepsis likely due to viral syndrome or acute and chronic bronchitis. Chest x-ray did not show pneumonia. Blood cultures, urine cultures are negative. Initially regular received empiric vancomycin, Zosyn. Patient did have lots of cough, WBC went up to as high as 16.5, ESR 36. Patient to fevers normalized, WBC also improved to 8. Discharge home with Augmentin. For 5 days. #2. Hypothyroidism: Continue Synthroid. #3 diabetes mellitus type 2: Advised to restart metformin 1 g by mouth twice a day. #4 peripheral neuropathy: Continue Neurontin.  #5 essential hypertension: Controlled. 6.ckd stage 3;controlled,  Discharge Condition: Stable   Follow UP  Follow-up Information    Follow up with MASOUD,JAVED, MD On 08/22/2015.   Specialty:  Internal Medicine   Why:  Appointment is at 10:15   Contact information:   [address] [place] [address] [phone]         Discharge Instructions  and  Discharge Medications      Medication List    STOP taking these medications        HYDROcodone-acetaminophen 7.5-325 MG tablet  Commonly known as:  [place] these medications        acetaminophen 650 MG suppository  Commonly known as:  TYLENOL  Place 1 suppository (650 mg total) rectally every 6 (six) hours as needed for mild pain (or Fever >/= 101).     amLODipine 2.5 MG tablet  Commonly known as:  NORVASC  Take 1 tablet (2.5 mg total) by mouth daily.     amoxicillin-clavulanate 875-125 MG tablet  Commonly known as:  AUGMENTIN  Take 1 tablet by mouth 2 (two) times daily.     aspirin 325 MG tablet  Take 325 mg by mouth daily.     etodolac 500 MG tablet  Commonly known as:  LODINE  Take 1 tablet (500 mg total) by mouth 2 (two) times daily.     finasteride 5 MG tablet  Commonly known as:  PROSCAR  Take 1 tablet (5 mg total) by mouth daily. Reported on 03/25/2015     gabapentin 300 MG capsule  Commonly known as:  NEURONTIN  Take 1  capsule by mouth at bedtime.     levothyroxine 175 MCG tablet  Commonly known as:  SYNTHROID, LEVOTHROID  Take 175 mcg by mouth daily before breakfast.     lisinopril-hydrochlorothiazide 20-12.5 MG tablet  Commonly known as:  PRINZIDE,ZESTORETIC  Take 1 tablet by mouth daily.     metFORMIN 1000 MG tablet  Commonly known as:  GLUCOPHAGE  Take 1,000 mg by mouth 2 (two) times daily with a meal.     methocarbamol 750 MG tablet  Commonly known as:  ROBAXIN  Take 750 mg by mouth every 8 (eight) hours as needed. Reported on 06/02/2015     metoprolol succinate 25 MG 24 hr tablet  Commonly known as:  TOPROL XL  Take 1 tablet (25 mg total) by mouth daily.     spironolactone 100 MG tablet  Commonly known as:  ALDACTONE  Take 100 mg by mouth daily.     tamsulosin 0.4 MG Caps capsule  Commonly known as:  FLOMAX  Take 0.4 mg by mouth daily. Reported on 06/02/2015     traMADol 50 MG tablet  Commonly known as:  ULTRAM  Take 50 mg by mouth daily. Reported on 06/02/2015     vardenafil 20 MG tablet  Commonly known as:  LEVITRA  Take 20 mg by mouth daily as needed for erectile dysfunction. Reported on 06/02/2015          Diet and Activity recommendation: See Discharge Instructions above   Consults obtained - None   Major procedures and Radiology Reports - PLEASE review detailed and final reports for all details, in brief -     Dg Chest 2 View  07/19/2015  CLINICAL DATA:  14 old male with fever and EXAM: CHEST  2 VIEW COMPARISON:  Chest radiograph dated 03/09/2015 FINDINGS: The heart size and mediastinal contours are within normal limits. Both lungs are clear. The visualized skeletal structures are unremarkable. IMPRESSION: No active cardiopulmonary disease. Electronically Signed   By: Anner Crete M.D.   On: 07/19/2015 21:58   Ct Head Wo Contrast  07/19/2015  CLINICAL DATA:  Acute onset of generalized weakness and fall, hitting back of head. Initial encounter. EXAM: CT HEAD  WITHOUT CONTRAST TECHNIQUE: Contiguous axial images were obtained from the base of the skull through the vertex without intravenous contrast. COMPARISON:  CT of the head performed 09/27/2011 FINDINGS: There is no evidence of acute infarction, mass lesion, or intra- or extra-axial hemorrhage on CT. The posterior fossa, including the cerebellum, brainstem and fourth ventricle, is within normal limits. The third and lateral ventricles, and basal ganglia are unremarkable in appearance. The cerebral hemispheres are symmetric in appearance, with normal gray-white differentiation. No mass effect or midline shift is seen. There is no evidence of fracture; visualized osseous structures are unremarkable in appearance. The visualized portions of the orbits are within normal limits. The paranasal sinuses and mastoid air cells are well-aerated.  No significant soft tissue abnormalities are seen. IMPRESSION: No evidence of traumatic intracranial injury or fracture. Electronically Signed   By: Garald Balding M.D.   On: 07/19/2015 22:23    Micro Results    Recent Results (from the past 240 hour(s))  Culture, blood (Routine x 2)     Status: None   Collection Time: 07/19/15  9:05 PM  Result Value Ref Range Status   Specimen Description BLOOD RIGHT ASSIST CONTROL  Final   Special Requests BOTTLES DRAWN AEROBIC AND ANAEROBIC 7 CC  Final   Culture NO GROWTH 5 DAYS  Final   Report Status 07/24/2015 FINAL  Final  Culture, blood (Routine x 2)     Status: None   Collection Time: 07/19/15  9:05 PM  Result Value Ref Range Status   Specimen Description BLOOD LEFT ARM  Final   Special Requests BOTTLES DRAWN AEROBIC AND ANAEROBIC 5 CC  Final   Culture NO GROWTH 5 DAYS  Final   Report Status 07/24/2015 FINAL  Final  Urine culture     Status: None   Collection Time: 07/19/15 10:40 PM  Result Value Ref Range Status   Specimen Description URINE, CLEAN CATCH  Final   Special Requests NONE  Final   Culture NO GROWTH Performed  at [facility]   Final   Report Status 07/21/2015 FINAL  Final       Today   Subjective:   Blair Dolphin today has no headache,no chest abdominal pain,no new weakness tingling or numbness, feels much better wants to go home today.   Objective:   Blood pressure 107/59, pulse 54, temperature 98 F (36.7 C), temperature source Oral, resp. rate 18, height _0  (1.854 m), weight 106.323 kg (234 lb 6.4 oz), SpO2 95 %.  No intake or output data in the 24 hours ending 07/24/15 1413  Exam Awake Alert, Oriented x 3, No new F.N deficits, Normal affect [place].AT,PERRAL Supple Neck,No JVD, No cervical lymphadenopathy appriciated.  Symmetrical Chest wall movement, Good air movement bilaterally, CTAB RRR,No Gallops,Rubs or new Murmurs, No Parasternal Heave +ve B.Sounds, Abd Soft, Non tender, No organomegaly appriciated, No rebound -guarding or rigidity. No Cyanosis, Clubbing or edema, No new Rash or bruise  Data Review   CBC w Diff:  Lab Results  Component Value Date   WBC 8.0 07/21/2015   WBC 6.5 10/06/2013   HGB 10.7* 07/21/2015   HGB 11.8* 10/06/2013   HCT 31.3* 07/21/2015   HCT 38.9 09/18/2014   HCT 37.3* 10/06/2013   PLT 164 07/21/2015   PLT 295 10/06/2013   LYMPHOPCT 8% 07/19/2015   LYMPHOPCT 35.9 10/06/2013   MONOPCT 6% 07/19/2015   MONOPCT 6.5 10/06/2013   EOSPCT 0% 07/19/2015   EOSPCT 6.2 10/06/2013   BASOPCT 0% 07/19/2015   BASOPCT 1.1 10/06/2013    CMP:  Lab Results  Component Value Date   NA 136 07/21/2015   NA 139 10/06/2013   K 3.5 07/21/2015   K 4.1 10/06/2013   CL 106 07/21/2015   CL 102 10/06/2013   CO2 24 07/21/2015   CO2 28 10/06/2013   BUN 22* 07/21/2015   BUN 27* 10/06/2013   CREATININE 1.41* 07/21/2015   CREATININE 1.37* 10/06/2013   PROT 6.8 07/20/2015   PROT 7.9 10/06/2013   ALBUMIN 3.5 07/20/2015   ALBUMIN 3.3* 10/06/2013   BILITOT 1.2 07/20/2015   BILITOT 0.8 10/06/2013   ALKPHOS 61 07/20/2015   ALKPHOS 70 10/06/2013    AST 32 07/20/2015  AST 32 10/06/2013   ALT 20 07/20/2015   ALT 22 10/06/2013  .   Total Time in preparing paper work, data evaluation and todays exam - 14 minutes  Cesar Alf M.D on 07/21/2015 at 2:13 PM    Note: This dictation was prepared with Dragon dictation along with smaller phrase technology. Any transcriptional errors that result from this process are unintentional.

## 2015-07-28 LAB — HM COLONOSCOPY

## 2015-07-31 ENCOUNTER — Ambulatory Visit: Payer: PPO | Admitting: Urology

## 2015-08-20 NOTE — Progress Notes (Deleted)
10:30 PM   Paul Lien [name] 01-19-1946 TS:[id number]  Referring provider: Cletis Athens, MD [address] [place], [place] [address]  No chief complaint on file.   HPI: Patient is a 70 year old African-American male who presents today for further discussion regarding a cystoscopic examination.    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    He has been satisfied with the results of the Testopel and like to continue that treatment modality.   His most recent serum testosterone was 323 ng/dL on 03/27/2015.  He is still feeling well and is not wanting to undergo a Testopel insertion at this time.  BPH with LUTS His IPSS score today is 1, which is mild lower urinary tract symptomatology. He is mostly satisfied with his quality life due to his urinary symptoms. His PVR is 0 mL.  His previous IPSS score was 5/2.  His previous PVR was 26 mL.  He states his previous complaint of incomplete bladder emptying and weak stream which occurs less than half the time has since resolved since his last visit.  He denies any dysuria, hematuria or suprapubic pain.  He currently taking tamsulosin and finasteride.  He also denies any recent fevers, chills, nausea or vomiting.  He does not have a family history of PCa.  His UA today is unremarkable.    Score:  1-7 Mild 8-19 Moderate 20-35 Severe   Erectile dysfunction Patient is responding to PD E5 inhibitors.     PMH: Past Medical History:  Diagnosis Date  . Acute prostatitis   . BPH (benign prostatic hyperplasia)   . Chronic kidney disease   . Dysplasia of prostate   . Erectile dysfunction   . HTN (hypertension)   . Hypogonadism in male   . Orchitis and epididymitis   . Over weight   . Palindromic rheumatism, hand   . Prostatitis   . Rectum pain   . Testicle swelling   . Testicle tenderness   . Testicular mass     Surgical History: Past Surgical History:  Procedure  Laterality Date  . BACK SURGERY      Home Medications:    Medication List       Accurate as of 08/20/15 10:30 PM. Always use your most recent med list.          acetaminophen 650 MG suppository Commonly known as:  TYLENOL Place 1 suppository (650 mg total) rectally every 6 (six) hours as needed for mild pain (or Fever >/= 101).   amLODipine 2.5 MG tablet Commonly known as:  NORVASC Take 1 tablet (2.5 mg total) by mouth daily.   amoxicillin-clavulanate 875-125 MG tablet Commonly known as:  AUGMENTIN Take 1 tablet by mouth 2 (two) times daily.   aspirin 325 MG tablet Take 325 mg by mouth daily.   etodolac 500 MG tablet Commonly known as:  LODINE Take 1 tablet (500 mg total) by mouth 2 (two) times daily.   finasteride 5 MG tablet Commonly known as:  PROSCAR Take 1 tablet (5 mg total) by mouth daily. Reported on 03/25/2015   gabapentin 300 MG capsule Commonly known as:  NEURONTIN Take 1 capsule by mouth at bedtime.   levothyroxine 175 MCG tablet Commonly known as:  SYNTHROID, LEVOTHROID Take 175 mcg by mouth daily before breakfast.   lisinopril-hydrochlorothiazide 20-12.5 MG tablet Commonly known as:  PRINZIDE,ZESTORETIC Take 1 tablet by mouth daily.   metFORMIN 1000 MG tablet Commonly known as:  GLUCOPHAGE Take  1,000 mg by mouth 2 (two) times daily with a meal.   methocarbamol 750 MG tablet Commonly known as:  ROBAXIN Take 750 mg by mouth every 8 (eight) hours as needed. Reported on 06/02/2015   metoprolol succinate 25 MG 24 hr tablet Commonly known as:  TOPROL XL Take 1 tablet (25 mg total) by mouth daily.   spironolactone 100 MG tablet Commonly known as:  ALDACTONE Take 100 mg by mouth daily.   tamsulosin 0.4 MG Caps capsule Commonly known as:  FLOMAX Take 0.4 mg by mouth daily. Reported on 06/02/2015   traMADol 50 MG tablet Commonly known as:  ULTRAM Take 50 mg by mouth daily. Reported on 06/02/2015   vardenafil 20 MG tablet Commonly known as:   LEVITRA Take 20 mg by mouth daily as needed for erectile dysfunction. Reported on 06/02/2015       Allergies:  Allergies  Allergen Reactions  . Enalapril Maleate Other (See Comments)    Family History: Family History  Problem Relation Age of Onset  . Cancer Maternal Aunt   . Prostate cancer Neg Hx   . Kidney disease Neg Hx     Social History:  reports that he has quit smoking. He does not have any smokeless tobacco history on file. He reports that he does not drink alcohol or use drugs.  ROS:                                        Physical Exam: There were no vitals taken for this visit.  GU: Patient with uncircumcised phallus.  Urethral meatus is patent.  No penile discharge. No penile lesions or rashes. Scrotum without lesions, cysts, rashes and/or edema.  Testicles are located scrotally bilaterally. No masses are appreciated in the testicles. Left and right epididymis are normal. Rectal: Patient with  normal sphincter tone. Perineum without scarring or rashes. No rectal masses are appreciated. Prostate is approximately 55 grams, no nodules are appreciated. Seminal vesicles are normal.   Laboratory Data:  Lab Results  Component Value Date   WBC 8.0 07/21/2015   HGB 10.7 (L) 07/21/2015   HCT 31.3 (L) 07/21/2015   MCV 85.6 07/21/2015   PLT 164 07/21/2015    Lab Results  Component Value Date   CREATININE 1.41 (H) 07/21/2015   PSA History:       0.4 ng/mL on 06/07/2013       0.5 ng/mL on 03/07/2014       0.4 ng/mL on 03/25/2015  Urinalysis Results for orders placed or performed during the hospital encounter of 07/19/15  Culture, blood (Routine x 2)  Result Value Ref Range   Specimen Description BLOOD RIGHT ASSIST CONTROL    Special Requests BOTTLES DRAWN AEROBIC AND ANAEROBIC 7 CC    Culture NO GROWTH 5 DAYS    Report Status 07/24/2015 FINAL   Culture, blood (Routine x 2)  Result Value Ref Range   Specimen Description BLOOD LEFT ARM     Special Requests BOTTLES DRAWN AEROBIC AND ANAEROBIC 5 CC    Culture NO GROWTH 5 DAYS    Report Status 07/24/2015 FINAL   Urine culture  Result Value Ref Range   Specimen Description URINE, CLEAN CATCH    Special Requests NONE    Culture NO GROWTH Performed at [facility]     Report Status 07/21/2015 FINAL   Comprehensive metabolic panel  Result Value Ref  Range   Sodium 138 135 - 145 mmol/L   Potassium 3.7 3.5 - 5.1 mmol/L   Chloride 103 101 - 111 mmol/L   CO2 23 22 - 32 mmol/L   Glucose, Bld 150 (H) 65 - 99 mg/dL   BUN 19 6 - 20 mg/dL   Creatinine, Ser 1.46 (H) 0.61 - 1.24 mg/dL   Calcium 9.6 8.9 - 10.3 mg/dL   Total Protein 8.4 (H) 6.5 - 8.1 g/dL   Albumin 4.7 3.5 - 5.0 g/dL   AST 39 15 - 41 U/L   ALT 26 17 - 63 U/L   Alkaline Phosphatase 79 38 - 126 U/L   Total Bilirubin 1.4 (H) 0.3 - 1.2 mg/dL   GFR calc non Af Amer 47 (L) >60 mL/min   GFR calc Af Amer 55 (L) >60 mL/min   Anion gap 12 5 - 15  Lactic acid, plasma  Result Value Ref Range   Lactic Acid, Venous 1.4 0.5 - 2.0 mmol/L  Lactic acid, plasma  Result Value Ref Range   Lactic Acid, Venous 1.6 0.5 - 2.0 mmol/L  CBC with Differential  Result Value Ref Range   WBC 15.0 (H) 3.8 - 10.6 K/uL   RBC 4.63 4.40 - 5.90 MIL/uL   Hemoglobin 13.1 13.0 - 18.0 g/dL   HCT 39.8 (L) 40.0 - 52.0 %   MCV 86.1 80.0 - 100.0 fL   MCH 28.3 26.0 - 34.0 pg   MCHC 32.9 32.0 - 36.0 g/dL   RDW 15.3 (H) 11.5 - 14.5 %   Platelets 220 150 - 440 K/uL   Neutrophils Relative % 86% %   Neutro Abs 12.9 (H) 1.4 - 6.5 K/uL   Lymphocytes Relative 8% %   Lymphs Abs 1.2 1.0 - 3.6 K/uL   Monocytes Relative 6% %   Monocytes Absolute 0.9 0.2 - 1.0 K/uL   Eosinophils Relative 0% %   Eosinophils Absolute 0.0 0 - 0.7 K/uL   Basophils Relative 0% %   Basophils Absolute 0.1 0 - 0.1 K/uL  Urinalysis complete, with microscopic  Result Value Ref Range   Color, Urine YELLOW (A) YELLOW   APPearance CLEAR (A) CLEAR   Glucose, UA NEGATIVE  NEGATIVE mg/dL   Bilirubin Urine NEGATIVE NEGATIVE   Ketones, ur 1+ (A) NEGATIVE mg/dL   Specific Gravity, Urine 1.023 1.005 - 1.030   Hgb urine dipstick 1+ (A) NEGATIVE   pH 5.0 5.0 - 8.0   Protein, ur NEGATIVE NEGATIVE mg/dL   Nitrite NEGATIVE NEGATIVE   Leukocytes, UA NEGATIVE NEGATIVE   RBC / HPF 0-5 0 - 5 RBC/hpf   WBC, UA 0-5 0 - 5 WBC/hpf   Bacteria, UA RARE (A) NONE SEEN   Squamous Epithelial / LPF NONE SEEN NONE SEEN   Mucous PRESENT   Troponin I  Result Value Ref Range   Troponin I <0.03 <0.031 ng/mL  Basic metabolic panel  Result Value Ref Range   Sodium 138 135 - 145 mmol/L   Potassium 3.4 (L) 3.5 - 5.1 mmol/L   Chloride 108 101 - 111 mmol/L   CO2 25 22 - 32 mmol/L   Glucose, Bld 158 (H) 65 - 99 mg/dL   BUN 18 6 - 20 mg/dL   Creatinine, Ser 1.50 (H) 0.61 - 1.24 mg/dL   Calcium 8.3 (L) 8.9 - 10.3 mg/dL   GFR calc non Af Amer 46 (L) >60 mL/min   GFR calc Af Amer 53 (L) >60 mL/min   Anion gap 5 5 -  15  CBC  Result Value Ref Range   WBC 16.5 (H) 3.8 - 10.6 K/uL   RBC 4.03 (L) 4.40 - 5.90 MIL/uL   Hemoglobin 11.4 (L) 13.0 - 18.0 g/dL   HCT 34.4 (L) 40.0 - 52.0 %   MCV 85.5 80.0 - 100.0 fL   MCH 28.2 26.0 - 34.0 pg   MCHC 33.0 32.0 - 36.0 g/dL   RDW 15.8 (H) 11.5 - 14.5 %   Platelets 198 150 - 440 K/uL  Hepatic function panel  Result Value Ref Range   Total Protein 6.8 6.5 - 8.1 g/dL   Albumin 3.5 3.5 - 5.0 g/dL   AST 32 15 - 41 U/L   ALT 20 17 - 63 U/L   Alkaline Phosphatase 61 38 - 126 U/L   Total Bilirubin 1.2 0.3 - 1.2 mg/dL   Bilirubin, Direct 0.1 0.1 - 0.5 mg/dL   Indirect Bilirubin 1.1 (H) 0.3 - 0.9 mg/dL  Sedimentation rate  Result Value Ref Range   Sed Rate 36 (H) 0 - 20 mm/hr  CBC  Result Value Ref Range   WBC 8.0 3.8 - 10.6 K/uL   RBC 3.66 (L) 4.40 - 5.90 MIL/uL   Hemoglobin 10.7 (L) 13.0 - 18.0 g/dL   HCT 31.3 (L) 40.0 - 52.0 %   MCV 85.6 80.0 - 100.0 fL   MCH 29.3 26.0 - 34.0 pg   MCHC 34.2 32.0 - 36.0 g/dL   RDW 15.6 (H) 11.5 - 14.5 %    Platelets 164 150 - 440 K/uL  Basic metabolic panel  Result Value Ref Range   Sodium 136 135 - 145 mmol/L   Potassium 3.5 3.5 - 5.1 mmol/L   Chloride 106 101 - 111 mmol/L   CO2 24 22 - 32 mmol/L   Glucose, Bld 137 (H) 65 - 99 mg/dL   BUN 22 (H) 6 - 20 mg/dL   Creatinine, Ser 1.41 (H) 0.61 - 1.24 mg/dL   Calcium 8.0 (L) 8.9 - 10.3 mg/dL   GFR calc non Af Amer 49 (L) >60 mL/min   GFR calc Af Amer 57 (L) >60 mL/min   Anion gap 6 5 - 15   Pertinent Imaging: Results for orders placed or performed during the hospital encounter of 07/19/15  Culture, blood (Routine x 2)  Result Value Ref Range   Specimen Description BLOOD RIGHT ASSIST CONTROL    Special Requests BOTTLES DRAWN AEROBIC AND ANAEROBIC 7 CC    Culture NO GROWTH 5 DAYS    Report Status 07/24/2015 FINAL   Culture, blood (Routine x 2)  Result Value Ref Range   Specimen Description BLOOD LEFT ARM    Special Requests BOTTLES DRAWN AEROBIC AND ANAEROBIC 5 CC    Culture NO GROWTH 5 DAYS    Report Status 07/24/2015 FINAL   Urine culture  Result Value Ref Range   Specimen Description URINE, CLEAN CATCH    Special Requests NONE    Culture NO GROWTH Performed at [facility]     Report Status 07/21/2015 FINAL   Comprehensive metabolic panel  Result Value Ref Range   Sodium 138 135 - 145 mmol/L   Potassium 3.7 3.5 - 5.1 mmol/L   Chloride 103 101 - 111 mmol/L   CO2 23 22 - 32 mmol/L   Glucose, Bld 150 (H) 65 - 99 mg/dL   BUN 19 6 - 20 mg/dL   Creatinine, Ser 1.46 (H) 0.61 - 1.24 mg/dL   Calcium 9.6 8.9 - 10.3  mg/dL   Total Protein 8.4 (H) 6.5 - 8.1 g/dL   Albumin 4.7 3.5 - 5.0 g/dL   AST 39 15 - 41 U/L   ALT 26 17 - 63 U/L   Alkaline Phosphatase 79 38 - 126 U/L   Total Bilirubin 1.4 (H) 0.3 - 1.2 mg/dL   GFR calc non Af Amer 47 (L) >60 mL/min   GFR calc Af Amer 55 (L) >60 mL/min   Anion gap 12 5 - 15  Lactic acid, plasma  Result Value Ref Range   Lactic Acid, Venous 1.4 0.5 - 2.0 mmol/L  Lactic acid, plasma   Result Value Ref Range   Lactic Acid, Venous 1.6 0.5 - 2.0 mmol/L  CBC with Differential  Result Value Ref Range   WBC 15.0 (H) 3.8 - 10.6 K/uL   RBC 4.63 4.40 - 5.90 MIL/uL   Hemoglobin 13.1 13.0 - 18.0 g/dL   HCT 39.8 (L) 40.0 - 52.0 %   MCV 86.1 80.0 - 100.0 fL   MCH 28.3 26.0 - 34.0 pg   MCHC 32.9 32.0 - 36.0 g/dL   RDW 15.3 (H) 11.5 - 14.5 %   Platelets 220 150 - 440 K/uL   Neutrophils Relative % 86% %   Neutro Abs 12.9 (H) 1.4 - 6.5 K/uL   Lymphocytes Relative 8% %   Lymphs Abs 1.2 1.0 - 3.6 K/uL   Monocytes Relative 6% %   Monocytes Absolute 0.9 0.2 - 1.0 K/uL   Eosinophils Relative 0% %   Eosinophils Absolute 0.0 0 - 0.7 K/uL   Basophils Relative 0% %   Basophils Absolute 0.1 0 - 0.1 K/uL  Urinalysis complete, with microscopic  Result Value Ref Range   Color, Urine YELLOW (A) YELLOW   APPearance CLEAR (A) CLEAR   Glucose, UA NEGATIVE NEGATIVE mg/dL   Bilirubin Urine NEGATIVE NEGATIVE   Ketones, ur 1+ (A) NEGATIVE mg/dL   Specific Gravity, Urine 1.023 1.005 - 1.030   Hgb urine dipstick 1+ (A) NEGATIVE   pH 5.0 5.0 - 8.0   Protein, ur NEGATIVE NEGATIVE mg/dL   Nitrite NEGATIVE NEGATIVE   Leukocytes, UA NEGATIVE NEGATIVE   RBC / HPF 0-5 0 - 5 RBC/hpf   WBC, UA 0-5 0 - 5 WBC/hpf   Bacteria, UA RARE (A) NONE SEEN   Squamous Epithelial / LPF NONE SEEN NONE SEEN   Mucous PRESENT   Troponin I  Result Value Ref Range   Troponin I <0.03 <0.031 ng/mL  Basic metabolic panel  Result Value Ref Range   Sodium 138 135 - 145 mmol/L   Potassium 3.4 (L) 3.5 - 5.1 mmol/L   Chloride 108 101 - 111 mmol/L   CO2 25 22 - 32 mmol/L   Glucose, Bld 158 (H) 65 - 99 mg/dL   BUN 18 6 - 20 mg/dL   Creatinine, Ser 1.50 (H) 0.61 - 1.24 mg/dL   Calcium 8.3 (L) 8.9 - 10.3 mg/dL   GFR calc non Af Amer 46 (L) >60 mL/min   GFR calc Af Amer 53 (L) >60 mL/min   Anion gap 5 5 - 15  CBC  Result Value Ref Range   WBC 16.5 (H) 3.8 - 10.6 K/uL   RBC 4.03 (L) 4.40 - 5.90 MIL/uL   Hemoglobin  11.4 (L) 13.0 - 18.0 g/dL   HCT 34.4 (L) 40.0 - 52.0 %   MCV 85.5 80.0 - 100.0 fL   MCH 28.2 26.0 - 34.0 pg   MCHC 33.0 32.0 -  36.0 g/dL   RDW 15.8 (H) 11.5 - 14.5 %   Platelets 198 150 - 440 K/uL  Hepatic function panel  Result Value Ref Range   Total Protein 6.8 6.5 - 8.1 g/dL   Albumin 3.5 3.5 - 5.0 g/dL   AST 32 15 - 41 U/L   ALT 20 17 - 63 U/L   Alkaline Phosphatase 61 38 - 126 U/L   Total Bilirubin 1.2 0.3 - 1.2 mg/dL   Bilirubin, Direct 0.1 0.1 - 0.5 mg/dL   Indirect Bilirubin 1.1 (H) 0.3 - 0.9 mg/dL  Sedimentation rate  Result Value Ref Range   Sed Rate 36 (H) 0 - 20 mm/hr  CBC  Result Value Ref Range   WBC 8.0 3.8 - 10.6 K/uL   RBC 3.66 (L) 4.40 - 5.90 MIL/uL   Hemoglobin 10.7 (L) 13.0 - 18.0 g/dL   HCT 31.3 (L) 40.0 - 52.0 %   MCV 85.6 80.0 - 100.0 fL   MCH 29.3 26.0 - 34.0 pg   MCHC 34.2 32.0 - 36.0 g/dL   RDW 15.6 (H) 11.5 - 14.5 %   Platelets 164 150 - 440 K/uL  Basic metabolic panel  Result Value Ref Range   Sodium 136 135 - 145 mmol/L   Potassium 3.5 3.5 - 5.1 mmol/L   Chloride 106 101 - 111 mmol/L   CO2 24 22 - 32 mmol/L   Glucose, Bld 137 (H) 65 - 99 mg/dL   BUN 22 (H) 6 - 20 mg/dL   Creatinine, Ser 1.41 (H) 0.61 - 1.24 mg/dL   Calcium 8.0 (L) 8.9 - 10.3 mg/dL   GFR calc non Af Amer 49 (L) >60 mL/min   GFR calc Af Amer 57 (L) >60 mL/min   Anion gap 6 5 - 15    Assessment & Plan:    1. Hypogonadism in male:   Patient's hypogonadism has been managed with Testopel. His last testosterone on 03/27/2015 was 323 ng/dL.   does not wish to undergo Testopel insertions. He'll be returning in 9 months for his annual exam and we will readdress at that time.   2. BPH (benign prostatic hyperplasia) with LUTS:  Patient's IPSS score is 1/2.  His PVR 0 mL.  His DRE demonstrates enlargement without nodules.  He will continue the finasteride and tamsulosin.   He'll be returning in 9 months for I PSS score, PSA and exam  - BLADDER SCAN AMB NON-  3. Erectile  dysfunction:   Patient responds well to Cialis.  Be returning in 9 months for a SHIM score and exam  4. Spraying of the urinary stream:   Resolved.  No Follow-up on file.  Zara Council, [facility] [address], [place], [place] [address] [phone]

## 2015-08-21 ENCOUNTER — Ambulatory Visit: Payer: PPO | Admitting: Urology

## 2015-08-21 ENCOUNTER — Encounter: Payer: Self-pay | Admitting: Urology

## 2015-09-18 ENCOUNTER — Ambulatory Visit: Payer: PPO | Admitting: Urology

## 2015-10-13 ENCOUNTER — Encounter: Payer: Self-pay | Admitting: Urology

## 2015-10-13 ENCOUNTER — Ambulatory Visit: Payer: PPO | Admitting: Urology

## 2015-10-22 ENCOUNTER — Emergency Department: Payer: PPO

## 2015-10-22 ENCOUNTER — Emergency Department
Admission: EM | Admit: 2015-10-22 | Discharge: 2015-10-22 | Disposition: A | Discharge status: Other Acute Inpatient Hospital | Payer: PPO | Attending: Emergency Medicine | Admitting: Emergency Medicine

## 2015-10-22 ENCOUNTER — Encounter: Payer: Self-pay | Admitting: Emergency Medicine

## 2015-10-22 DIAGNOSIS — Y939 Activity, unspecified: Secondary | ICD-10-CM | POA: Insufficient documentation

## 2015-10-22 DIAGNOSIS — W01198A Fall on same level from slipping, tripping and stumbling with subsequent striking against other object, initial encounter: Secondary | ICD-10-CM | POA: Diagnosis not present

## 2015-10-22 DIAGNOSIS — I129 Hypertensive chronic kidney disease with stage 1 through stage 4 chronic kidney disease, or unspecified chronic kidney disease: Secondary | ICD-10-CM | POA: Diagnosis not present

## 2015-10-22 DIAGNOSIS — E039 Hypothyroidism, unspecified: Secondary | ICD-10-CM | POA: Insufficient documentation

## 2015-10-22 DIAGNOSIS — Y929 Unspecified place or not applicable: Secondary | ICD-10-CM | POA: Insufficient documentation

## 2015-10-22 DIAGNOSIS — Z7982 Long term (current) use of aspirin: Secondary | ICD-10-CM | POA: Insufficient documentation

## 2015-10-22 DIAGNOSIS — S065XAA Traumatic subdural hemorrhage with loss of consciousness status unknown, initial encounter: Secondary | ICD-10-CM

## 2015-10-22 DIAGNOSIS — Z87891 Personal history of nicotine dependence: Secondary | ICD-10-CM | POA: Insufficient documentation

## 2015-10-22 DIAGNOSIS — I6200 Nontraumatic subdural hemorrhage, unspecified: Secondary | ICD-10-CM | POA: Diagnosis not present

## 2015-10-22 DIAGNOSIS — M542 Cervicalgia: Secondary | ICD-10-CM

## 2015-10-22 DIAGNOSIS — Z79899 Other long term (current) drug therapy: Secondary | ICD-10-CM | POA: Diagnosis not present

## 2015-10-22 DIAGNOSIS — S065X0A Traumatic subdural hemorrhage without loss of consciousness, initial encounter: Secondary | ICD-10-CM | POA: Insufficient documentation

## 2015-10-22 DIAGNOSIS — S0990XA Unspecified injury of head, initial encounter: Secondary | ICD-10-CM | POA: Diagnosis present

## 2015-10-22 DIAGNOSIS — Z791 Long term (current) use of non-steroidal anti-inflammatories (NSAID): Secondary | ICD-10-CM | POA: Insufficient documentation

## 2015-10-22 DIAGNOSIS — Z7984 Long term (current) use of oral hypoglycemic drugs: Secondary | ICD-10-CM | POA: Diagnosis not present

## 2015-10-22 DIAGNOSIS — N183 Chronic kidney disease, stage 3 (moderate): Secondary | ICD-10-CM | POA: Insufficient documentation

## 2015-10-22 DIAGNOSIS — E1122 Type 2 diabetes mellitus with diabetic chronic kidney disease: Secondary | ICD-10-CM | POA: Insufficient documentation

## 2015-10-22 DIAGNOSIS — Y999 Unspecified external cause status: Secondary | ICD-10-CM | POA: Insufficient documentation

## 2015-10-22 DIAGNOSIS — T884XXA Failed or difficult intubation, initial encounter: Secondary | ICD-10-CM

## 2015-10-22 DIAGNOSIS — G939 Disorder of brain, unspecified: Secondary | ICD-10-CM | POA: Diagnosis not present

## 2015-10-22 HISTORY — DX: Failed or difficult intubation, initial encounter: T88.4XXA

## 2015-10-22 HISTORY — PX: BURR HOLE OF CRANIUM: SUR169

## 2015-10-22 HISTORY — DX: Traumatic subdural hemorrhage with loss of consciousness status unknown, initial encounter: S06.5XAA

## 2015-10-22 LAB — CBC
HEMATOCRIT: 34.7 % — AB (ref 40.0–52.0)
Hemoglobin: 11.6 g/dL — ABNORMAL LOW (ref 13.0–18.0)
MCH: 28.2 pg (ref 26.0–34.0)
MCHC: 33.4 g/dL (ref 32.0–36.0)
MCV: 84.5 fL (ref 80.0–100.0)
PLATELETS: 155 10*3/uL (ref 150–440)
RBC: 4.1 MIL/uL — AB (ref 4.40–5.90)
RDW: 18.5 % — ABNORMAL HIGH (ref 11.5–14.5)
WBC: 12.9 10*3/uL — ABNORMAL HIGH (ref 3.8–10.6)

## 2015-10-22 LAB — COMPREHENSIVE METABOLIC PANEL
ALT: 31 U/L (ref 17–63)
AST: 47 U/L — ABNORMAL HIGH (ref 15–41)
Albumin: 3.8 g/dL (ref 3.5–5.0)
Alkaline Phosphatase: 73 U/L (ref 38–126)
Anion gap: 4 — ABNORMAL LOW (ref 5–15)
BUN: 16 mg/dL (ref 6–20)
CHLORIDE: 107 mmol/L (ref 101–111)
CO2: 27 mmol/L (ref 22–32)
Calcium: 8.8 mg/dL — ABNORMAL LOW (ref 8.9–10.3)
Creatinine, Ser: 1.25 mg/dL — ABNORMAL HIGH (ref 0.61–1.24)
GFR calc Af Amer: >60 mL/min (ref >60)
GFR calc non Af Amer: 57 mL/min — ABNORMAL LOW (ref >60)
GLUCOSE: 125 mg/dL — AB (ref 65–99)
POTASSIUM: 3.3 mmol/L — AB (ref 3.5–5.1)
SODIUM: 138 mmol/L (ref 135–145)
Total Bilirubin: 1.5 mg/dL — ABNORMAL HIGH (ref 0.3–1.2)
Total Protein: 7.3 g/dL (ref 6.5–8.1)

## 2015-10-22 LAB — DIFFERENTIAL
Basophils Absolute: 0.1 10*3/uL (ref 0–0.1)
Basophils Relative: 1 %
EOS PCT: 0 %
Eosinophils Absolute: 0 10*3/uL (ref 0–0.7)
LYMPHS PCT: 28 %
Lymphs Abs: 3.6 10*3/uL (ref 1.0–3.6)
MONO ABS: 1.1 10*3/uL — AB (ref 0.2–1.0)
MONOS PCT: 8 %
Neutro Abs: 8.2 10*3/uL — ABNORMAL HIGH (ref 1.4–6.5)
Neutrophils Relative %: 63 %

## 2015-10-22 LAB — PROTIME-INR
INR: 1.04
PROTHROMBIN TIME: 13.6 s (ref 11.4–15.2)

## 2015-10-22 LAB — APTT: aPTT: 33 seconds (ref 24–36)

## 2015-10-22 LAB — TROPONIN I: Troponin I: 0.03 ng/mL (ref <0.03)

## 2015-10-22 MED ORDER — LABETALOL HCL 5 MG/ML IV SOLN
5.0000 mg | Freq: Once | INTRAVENOUS | Status: AC
  Administered 2015-10-22: 5 mg via INTRAVENOUS

## 2015-10-22 MED ORDER — PROPOFOL 1000 MG/100ML IV EMUL: 5.0000 ug/kg/min | Freq: Once | INTRAVENOUS | Status: DC

## 2015-10-22 MED ORDER — PROPOFOL 10 MG/ML IV BOLUS
40.0000 mg | Freq: Once | INTRAVENOUS | Status: AC
  Administered 2015-10-22: 40 mg via INTRAVENOUS

## 2015-10-22 MED ORDER — NICARDIPINE HCL IN NACL 20-0.86 MG/200ML-% IV SOLN
INTRAVENOUS | Status: AC
  Filled 2015-10-22: qty 200

## 2015-10-22 MED ORDER — PROPOFOL 1000 MG/100ML IV EMUL
INTRAVENOUS | Status: AC
  Filled 2015-10-22: qty 100

## 2015-10-22 MED ORDER — SUCCINYLCHOLINE CHLORIDE 20 MG/ML IJ SOLN
100.0000 mg | Freq: Once | INTRAMUSCULAR | Status: AC
  Administered 2015-10-22: 100 mg via INTRAVENOUS

## 2015-10-22 MED ORDER — ETOMIDATE 2 MG/ML IV SOLN
30.0000 mg | Freq: Once | INTRAVENOUS | Status: AC
  Administered 2015-10-22: 30 mg via INTRAVENOUS

## 2015-10-22 MED ORDER — SUCCINYLCHOLINE CHLORIDE 20 MG/ML IJ SOLN
150.0000 mg | Freq: Once | INTRAMUSCULAR | Status: AC
  Administered 2015-10-22: 150 mg via INTRAVENOUS

## 2015-10-22 MED ORDER — PROPOFOL 10 MG/ML IV BOLUS
20.0000 mg | Freq: Once | INTRAVENOUS | Status: AC
  Administered 2015-10-22: 20 mg via INTRAVENOUS

## 2015-10-22 MED ORDER — PROPOFOL 1000 MG/100ML IV EMUL
INTRAVENOUS | Status: DC
Start: 2015-10-22 — End: 2015-10-22
  Filled 2015-10-22: qty 100

## 2015-10-22 MED ORDER — LABETALOL HCL 5 MG/ML IV SOLN
INTRAVENOUS | Status: AC
  Administered 2015-10-22: 5 mg via INTRAVENOUS
  Filled 2015-10-22: qty 4

## 2015-10-22 NOTE — ED Provider Notes (Signed)
[facility] Emergency Department Provider Note  ____________________________________________  Time seen: Approximately 11:27 AM  I have reviewed the triage vital signs and the nursing notes.   HISTORY  Chief Complaint Code Stroke   HPI Paul Briggs. is a 70 y.o. 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 Patient presently he woke up this morning was still little somnolent when he tripped and fell hitting his head on the floor. No LOC. Patient is complaining of severe headache and neck pain since the fall. Patient denies weakness or numbness of his lower extremities, facial droop. Patient is not on any blood thinners other than aspirin. Patient was made a code stroke. No vomiting. NO other injuries  Past Medical History:  Diagnosis Date  . Acute prostatitis   . BPH (benign prostatic hyperplasia)   . Chronic kidney disease   . Dysplasia of prostate   . Erectile dysfunction   . HTN (hypertension)   . Hypogonadism in male   . Orchitis and epididymitis   . Over weight   . Palindromic rheumatism, hand   . Prostatitis   . Rectum pain   . Testicle swelling   . Testicle tenderness   . Testicular mass     Patient Active Problem List   Diagnosis Date Noted  . Sepsis ([place]) 07/19/2015  . HTN (hypertension) 07/19/2015  . Diabetes mellitus ([place]) 07/19/2015  . Hypothyroidism 07/19/2015  . Peripheral neuropathy ([place]) 07/19/2015  . Chronic back pain 07/19/2015  . CKD (chronic kidney disease) stage 3, GFR 30-59 ml/min 07/19/2015  . Enlarged prostate 03/25/2015  . Urine stream spraying 03/25/2015  . Hypogonadism in male 09/18/2014  . BPH with obstruction/lower urinary tract symptoms 09/18/2014  . Other male erectile dysfunction 09/18/2014    Past Surgical History:  Procedure Laterality Date  . BACK SURGERY      Prior to Admission medications   Medication Sig Start Date End  Date Taking? Authorizing Provider  acetaminophen (TYLENOL) 650 MG suppository Place 1 suppository (650 mg total) rectally every 6 (six) hours as needed for mild pain (or Fever >/= 101). 07/21/15   Epifanio Lesches, MD  amLODipine (NORVASC) 2.5 MG tablet Take 1 tablet (2.5 mg total) by mouth daily. 07/21/15   Epifanio Lesches, MD  amoxicillin-clavulanate (AUGMENTIN) 875-125 MG tablet Take 1 tablet by mouth 2 (two) times daily. 07/21/15   Epifanio Lesches, MD  aspirin 325 MG tablet Take 325 mg by mouth daily.     Historical Provider, MD  etodolac (LODINE) 500 MG tablet Take 1 tablet (500 mg total) by mouth 2 (two) times daily. 04/27/15   [name], PA-C  finasteride (PROSCAR) 5 MG tablet Take 1 tablet (5 mg total) by mouth daily. Reported on 03/25/2015 03/26/15   Larene Beach A McGowan, PA-C  gabapentin (NEURONTIN) 300 MG capsule Take 1 capsule by mouth at bedtime.  06/30/15   Historical Provider, MD  levothyroxine (SYNTHROID, LEVOTHROID) 175 MCG tablet Take 175 mcg by mouth daily before breakfast.  05/22/15   Historical Provider, MD  lisinopril-hydrochlorothiazide (PRINZIDE,ZESTORETIC) 20-12.5 MG per tablet Take 1 tablet by mouth daily.    Historical Provider, MD  metFORMIN (GLUCOPHAGE) 1000 MG tablet Take 1,000 mg by mouth 2 (two) times daily with a meal.    Historical Provider, MD  methocarbamol (ROBAXIN) 750 MG tablet Take 750 mg by mouth every 8 (eight) hours as needed. Reported on 06/02/2015 04/15/15   Historical Provider, MD  metoprolol succinate (TOPROL  XL) 25 MG 24 hr tablet Take 1 tablet (25 mg total) by mouth daily. 07/21/15   Epifanio Lesches, MD  spironolactone (ALDACTONE) 100 MG tablet Take 100 mg by mouth daily.     Historical Provider, MD  tamsulosin (FLOMAX) 0.4 MG CAPS capsule Take 0.4 mg by mouth daily. Reported on 06/02/2015    Historical Provider, MD  traMADol (ULTRAM) 50 MG tablet Take 50 mg by mouth daily. Reported on 06/02/2015    Historical Provider, MD  vardenafil (LEVITRA) 20 MG  tablet Take 20 mg by mouth daily as needed for erectile dysfunction. Reported on 06/02/2015    Historical Provider, MD    Allergies Enalapril maleate  Family History  Problem Relation Age of Onset  . Cancer Maternal Aunt   . Prostate cancer Neg Hx   . Kidney disease Neg Hx     Social History Social History  Substance Use Topics  . Smoking status: Former Research scientist (life sciences)  . Smokeless tobacco: Not on file     Comment: quit 30 years ago  . Alcohol use No    Review of Systems Constitutional: Negative for fever. Eyes: Negative for visual changes. ENT: Negative for facial injury. + neck pain Cardiovascular: Negative for chest injury. Respiratory: Negative for shortness of breath. Negative for chest wall injury. Gastrointestinal: Negative for abdominal pain or injury. Genitourinary: Negative for dysuria. Musculoskeletal: Negative for back injury, negative for arm or leg pain. Skin: Negative for laceration/abrasions. Neurological: +head injury.   ____________________________________________   PHYSICAL EXAM:  VITAL SIGNS: ED Triage Vitals  Enc Vitals Group     BP 10/22/15 1109 (!) 172/95     Pulse --      Resp 10/22/15 1109 19     Temp 10/22/15 1109 98.6 F (37 C)     Temp Source 10/22/15 1109 Oral     SpO2 --      Weight 10/22/15 1110 235 lb (106.6 kg)     Height 10/22/15 1110 6\' 1"  (1.854 m)     Head Circumference --      Peak Flow --      Pain Score 10/22/15 1113 8     Pain Loc --      Pain Edu? --      Excl. in [place]? --    Full spinal precautions maintained throughout the trauma exam. Constitutional: Alert and oriented. No acute distress. Does not appear intoxicated. HEENT Head: Normocephalic and atraumatic. Face: No facial bony tenderness. Stable midface Ears: No hemotympanum bilaterally. No Battle sign Eyes: No eye injury. PERRL. No raccoon eyes Nose: Nontender. No epistaxis. No rhinorrhea Mouth/Throat: Mucous membranes are moist. No oropharyngeal blood. No dental  injury. Airway patent without stridor. Normal voice. Neck: C-collar in place. midline c-spine tenderness with no step offs Cardiovascular: Normal rate, regular rhythm. Normal and symmetric distal pulses are present in all extremities. Pulmonary/Chest: Chest wall is stable and nontender to palpation/compression. Normal respiratory effort. Breath sounds are normal. No crepitus.  Abdominal: Soft, nontender, non distended. Musculoskeletal: Nontender with normal full range of motion in all extremities. No deformities. No thoracic or lumbar midline spinal tenderness. Pelvis is stable. Skin: Skin is warm, dry and intact. No abrasions or contutions. Psychiatric: Speech and behavior are appropriate. Neurological: Normal speech and language. Moves all extremities to command. No gross focal neurologic deficits are appreciated.  Glascow Coma Score: 4 - Opens eyes on own 6 - Follows simple motor commands 5 - Alert and oriented GCS: 15  ____________________________________________   LABS (all labs  ordered are listed, but only abnormal results are displayed)  Labs Reviewed  CBC - Abnormal; Notable for the following:       Result Value   WBC 12.9 (*)    RBC 4.10 (*)    Hemoglobin 11.6 (*)    HCT 34.7 (*)    RDW 18.5 (*)    All other components within normal limits  DIFFERENTIAL - Abnormal; Notable for the following:    Neutro Abs 8.2 (*)    Monocytes Absolute 1.1 (*)    All other components within normal limits  PROTIME-INR  APTT  COMPREHENSIVE METABOLIC PANEL  TROPONIN I  CBG MONITORING, ED   ____________________________________________  EKG  ED ECG REPORT I, Rudene Re, the attending physician, personally viewed and interpreted this ECG.  Normal sinus rhythm, rate of 61, first-degree AV block, normal QTC, left axis deviation, T-wave inversions in 1 and aVL with ST depressions on 3 and aVF. No ST elevation. ____________________________________________  RADIOLOGY  Head CT:   High-density subdural hematoma around the left cerebral convexity causing up to 13 mm midline shift. Increased volume of the right lateral ventricle compared to June 2017, likely developing Entrapment.  CT cervical spine: 1. No acute abnormalities of the cervical spine. 2. Diffuse degenerative disc disease, minimally progressed since 2010. ____________________________________________   PROCEDURES  Procedure(s) performed: None .Intubation Date/Time: 10/22/2015 12:41 PM Performed by: Rudene Re Authorized by: Rudene Re   Consent:    Consent obtained:  Verbal   Consent given by:  Spouse   Risks discussed:  Aspiration, brain injury, dental trauma, laryngeal injury, bleeding, death, hypoxia and pneumothorax   Alternatives discussed:  No treatment Pre-procedure details:    Patient status:  Altered mental status   Mallampati score:  IV   Paralytics:  Succinylcholine Procedure details:    Preoxygenation:  Bag valve mask   CPR in progress: no     Intubation method:  Oral   Oral intubation technique:  Direct   Laryngoscope blade:  Mac 4   Tube size (mm):  7.5   Tube type:  Cuffed   Number of attempts:  3 or more   Ventilation between attempts: yes     Tube visualized through cords: yes   Placement assessment:    ETT to lip:  23   Tube secured with:  ETT holder   Breath sounds:  Equal and absent over the epigastrium   Placement verification: chest rise, CXR verification, equal breath sounds and ETCO2 detector     CXR findings:  ETT in proper place Post-procedure details:    Patient tolerance of procedure:  Tolerated well, no immediate complications Comments:     First attempt done with the glidescope however due to severely large tongue this attempt was unsuccessful. Second attempt was performed with Mac 4 and direct laryngoscopy with a 8.0 tube which was too big and would not pass through the cords. Third successful attempt was done with a Mac 4 and a 7.5  tube   Critical Care performed: yes  CRITICAL CARE Performed by: Rudene Re  ?  Total critical care time: 45 min  Critical care time was exclusive of separately billable procedures and treating other patients.  Critical care was necessary to treat or prevent imminent or life-threatening deterioration.  Critical care was time spent personally by me on the following activities: development of treatment plan with patient and/or surrogate as well as nursing, discussions with consultants, evaluation of patient's response to treatment, examination of patient, obtaining history  from patient or surrogate, ordering and performing treatments and interventions, ordering and review of laboratory studies, ordering and review of radiographic studies, pulse oximetry and re-evaluation of patient's condition.  ____________________________________________   INITIAL IMPRESSION / ASSESSMENT AND PLAN / ED COURSE  70 y.o. 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 Patient will midline C-spine tenderness. Patient not on blood thinners. GCS of 15. Head CT concerning for acute subdural hematoma with 13 mm midline shift. Patient is neuro intact, protecting airway. VS WNL. CT neck pending. Will call [facility] for transfer.  Clinical Course   _________________________ 11:27 AM on 10/22/2015 -----------------------------------------  Spoke with Dr. Tamala Julian, [facility] ED who accepted patient as a trauma transfer for evaluation by trauma team and neurosurgery. Patient remains with a GCS of 15 and hemodynamically stable. Head of the bed elevated to 30. Patient is on a c-collar. No other injuries.  _________________________ 12:40 PM on 10/22/2015 -----------------------------------------  Patient's mental status deteriorated to a GCS of 10 and since patient was to be transported via helicopter decision was made to intubate.  Patient's wife and son consented for this procedure. Patient was intubated per procedure note above. Patient was started on propofol drip currently at 60 mcg per hour. Patient was given labetalol 5 mg x 2 for elevated BP. HOB elevated at 30 degrees. [facility] here to transport patient to [facility].  Pertinent labs & imaging results that were available during my care of the patient were reviewed by me and considered in my medical decision making (see chart for details).    ____________________________________________   FINAL CLINICAL IMPRESSION(S) / ED DIAGNOSES  Final diagnoses:  Neck pain  Traumatic subdural hematoma without loss of consciousness, initial encounter ([place])      NEW MEDICATIONS STARTED DURING THIS VISIT:  New Prescriptions   No medications on file     Note:  This document was prepared using Dragon voice recognition software and may include unintentional dictation errors.    Rudene Re, MD 10/22/15 1246

## 2015-10-22 NOTE — ED Triage Notes (Signed)
Pt reports falling this morning when he got up to go to the bathroom. Pt reports snapping his neck and hitting his head.

## 2015-10-22 NOTE — ED Notes (Addendum)
Per DR Alfred Levins, parameters for Propofol drip, stop if  Systolic B/P 123XX123 and HR [id number]

## 2015-10-22 NOTE — ED Notes (Signed)
HOB elevated 30 degrees by Dr. Alfred Levins.

## 2015-10-22 NOTE — Consult Note (Addendum)
Referring Physician: Alfred Levins    Chief Complaint: Off balance  HPI: Paul Wragg [name] is an 70 y.o. male who reports getting up this morning and still feeling that the was asleep.  Got up to go to the bathroom, stumbled and fell, hitting his head and having his head snap back.  Began to have a headache and bilateral shoulder/neck pain.  Patient presented to the ED at that time for further evaluation.  Initial NIHSS of 5.    Date last known well: Date: 10/21/2015 Time last known well: Time: 22:30 tPA Given: No: SDH  MRankin: 0  Past Medical History:  Diagnosis Date  . Acute prostatitis   . BPH (benign prostatic hyperplasia)   . Chronic kidney disease   . Dysplasia of prostate   . Erectile dysfunction   . HTN (hypertension)   . Hypogonadism in male   . Orchitis and epididymitis   . Over weight   . Palindromic rheumatism, hand   . Prostatitis   . Rectum pain   . Testicle swelling   . Testicle tenderness   . Testicular mass     Past Surgical History:  Procedure Laterality Date  . BACK SURGERY      Family History  Problem Relation Age of Onset  . Cancer Maternal Aunt   . Prostate cancer Neg Hx   . Kidney disease Neg Hx    Social History:  reports that he has quit smoking. He does not have any smokeless tobacco history on file. He reports that he does not drink alcohol or use drugs.  Allergies:  Allergies  Allergen Reactions  . Enalapril Maleate Other (See Comments)    Medications: I have reviewed the patient's current medications. Prior to Admission:  Prior to Admission medications   Medication Sig Start Date End Date Taking? Authorizing Provider  acetaminophen (TYLENOL) 650 MG suppository Place 1 suppository (650 mg total) rectally every 6 (six) hours as needed for mild pain (or Fever >/= 101). 07/21/15   Epifanio Lesches, MD  amLODipine (NORVASC) 2.5 MG tablet Take 1 tablet (2.5 mg total) by mouth daily. 07/21/15   Epifanio Lesches, MD   amoxicillin-clavulanate (AUGMENTIN) 875-125 MG tablet Take 1 tablet by mouth 2 (two) times daily. 07/21/15   Epifanio Lesches, MD  aspirin 325 MG tablet Take 325 mg by mouth daily.     Historical Provider, MD  etodolac (LODINE) 500 MG tablet Take 1 tablet (500 mg total) by mouth 2 (two) times daily. 04/27/15   [name], PA-C  finasteride (PROSCAR) 5 MG tablet Take 1 tablet (5 mg total) by mouth daily. Reported on 03/25/2015 03/26/15   Larene Beach A McGowan, PA-C  gabapentin (NEURONTIN) 300 MG capsule Take 1 capsule by mouth at bedtime.  06/30/15   Historical Provider, MD  levothyroxine (SYNTHROID, LEVOTHROID) 175 MCG tablet Take 175 mcg by mouth daily before breakfast.  05/22/15   Historical Provider, MD  lisinopril-hydrochlorothiazide (PRINZIDE,ZESTORETIC) 20-12.5 MG per tablet Take 1 tablet by mouth daily.    Historical Provider, MD  metFORMIN (GLUCOPHAGE) 1000 MG tablet Take 1,000 mg by mouth 2 (two) times daily with a meal.    Historical Provider, MD  methocarbamol (ROBAXIN) 750 MG tablet Take 750 mg by mouth every 8 (eight) hours as needed. Reported on 06/02/2015 04/15/15   Historical Provider, MD  metoprolol succinate (TOPROL XL) 25 MG 24 hr tablet Take 1 tablet (25 mg total) by mouth daily. 07/21/15   Epifanio Lesches, MD  spironolactone (ALDACTONE) 100 MG tablet Take  100 mg by mouth daily.     Historical Provider, MD  tamsulosin (FLOMAX) 0.4 MG CAPS capsule Take 0.4 mg by mouth daily. Reported on 06/02/2015    Historical Provider, MD  traMADol (ULTRAM) 50 MG tablet Take 50 mg by mouth daily. Reported on 06/02/2015    Historical Provider, MD  vardenafil (LEVITRA) 20 MG tablet Take 20 mg by mouth daily as needed for erectile dysfunction. Reported on 06/02/2015    Historical Provider, MD    ROS: History obtained from the patient  General ROS: negative for - chills, fatigue, fever, night sweats, weight gain or weight loss Psychological ROS: negative for - behavioral disorder, hallucinations, memory  difficulties, mood swings or suicidal ideation Ophthalmic ROS: negative for - blurry vision, double vision, eye pain or loss of vision ENT ROS: negative for - epistaxis, nasal discharge, oral lesions, sore throat, tinnitus or vertigo Allergy and Immunology ROS: negative for - hives or itchy/watery eyes Hematological and Lymphatic ROS: negative for - bleeding problems, bruising or swollen lymph nodes Endocrine ROS: negative for - galactorrhea, hair pattern changes, polydipsia/polyuria or temperature intolerance Respiratory ROS: negative for - cough, hemoptysis, shortness of breath or wheezing Cardiovascular ROS: negative for - chest pain, dyspnea on exertion, edema or irregular heartbeat Gastrointestinal ROS: negative for - abdominal pain, diarrhea, hematemesis, nausea/vomiting or stool incontinence Genito-Urinary ROS: negative for - dysuria, hematuria, incontinence or urinary frequency/urgency Musculoskeletal ROS: as noted in HPI Neurological ROS: as noted in HPI Dermatological ROS: negative for rash and skin lesion changes  Physical Examination: Blood pressure (!) 172/95, temperature 98.6 F (37 C), temperature source Oral, resp. rate 19, height 6\' 1"  (1.854 m), weight 106.6 kg (235 lb).  HEENT-  Normocephalic, no lesions, without obvious abnormality.  Normal external eye and conjunctiva.  Normal TM's bilaterally.  Normal auditory canals and external ears. Normal external nose, mucus membranes and septum.  Normal pharynx. Cardiovascular- S1, S2 normal, pulses palpable throughout   Lungs- chest clear, no wheezing, rales, normal symmetric air entry Abdomen- soft, non-tender; bowel sounds normal; no masses,  no organomegaly Extremities- no edema Lymph-no adenopathy palpable Musculoskeletal-no joint tenderness, deformity or swelling Skin-warm and dry, no hyperpigmentation, vitiligo, or suspicious lesions  Neurological Examination Mental Status: Alert, oriented, thought content appropriate.   Speech fluent without evidence of aphasia.  Able to follow 3 step commands without difficulty. Cranial Nerves: II: Discs flat bilaterally; Visual fields grossly normal, pupils equal, round, reactive to light and accommodation III,IV, VI: ptosis not present, extra-ocular motions intact bilaterally V,VII: mild right facial droop, facial light touch sensation normal bilaterally VIII: hearing normal bilaterally IX,X: gag reflex present XI: bilateral shoulder shrug XII: midline tongue extension Motor: Although some limitation to pain able to maintain both arms against gravity equally.  Unable to maintain either leg off the bed.  RLE external rotated at rest.  Normal tone. Sensory: Pinprick and light touch intact throughout, bilaterally Deep Tendon Reflexes: 2+ and symmetric throughout Plantars: Right: downgoing   Left: downgoing Cerebellar: Normal finger-to-nose and normal heel-to-shin testing bilaterally Gait: wide based and off balance      Laboratory Studies:  Basic Metabolic Panel: No results for input(s): NA, K, CL, CO2, GLUCOSE, BUN, CREATININE, CALCIUM, MG, PHOS in the last 168 hours.  Liver Function Tests: No results for input(s): AST, ALT, ALKPHOS, BILITOT, PROT, ALBUMIN in the last 168 hours. No results for input(s): LIPASE, AMYLASE in the last 168 hours. No results for input(s): AMMONIA in the last 168 hours.  CBC:  Recent Labs  Lab 10/22/15 1109  WBC 12.9*  NEUTROABS 8.2*  HGB 11.6*  HCT 34.7*  MCV 84.5  PLT 155    Cardiac Enzymes: No results for input(s): CKTOTAL, CKMB, CKMBINDEX, TROPONINI in the last 168 hours.  BNP: Invalid input(s): POCBNP  CBG: No results for input(s): GLUCAP in the last 168 hours.  Microbiology: Results for orders placed or performed during the hospital encounter of 07/19/15  Culture, blood (Routine x 2)     Status: None   Collection Time: 07/19/15  9:05 PM  Result Value Ref Range Status   Specimen Description BLOOD RIGHT ASSIST  CONTROL  Final   Special Requests BOTTLES DRAWN AEROBIC AND ANAEROBIC 7 CC  Final   Culture NO GROWTH 5 DAYS  Final   Report Status 07/24/2015 FINAL  Final  Culture, blood (Routine x 2)     Status: None   Collection Time: 07/19/15  9:05 PM  Result Value Ref Range Status   Specimen Description BLOOD LEFT ARM  Final   Special Requests BOTTLES DRAWN AEROBIC AND ANAEROBIC 5 CC  Final   Culture NO GROWTH 5 DAYS  Final   Report Status 07/24/2015 FINAL  Final  Urine culture     Status: None   Collection Time: 07/19/15 10:40 PM  Result Value Ref Range Status   Specimen Description URINE, CLEAN CATCH  Final   Special Requests NONE  Final   Culture NO GROWTH Performed at [facility]   Final   Report Status 07/21/2015 FINAL  Final    Coagulation Studies: No results for input(s): LABPROT, INR in the last 72 hours.  Urinalysis: No results for input(s): COLORURINE, LABSPEC, PHURINE, GLUCOSEU, HGBUR, BILIRUBINUR, KETONESUR, PROTEINUR, UROBILINOGEN, NITRITE, LEUKOCYTESUR in the last 168 hours.  Invalid input(s): APPERANCEUR  Lipid Panel: No results found for: CHOL, TRIG, HDL, CHOLHDL, VLDL, LDLCALC  HgbA1C: No results found for: HGBA1C  Urine Drug Screen:  No results found for: LABOPIA, COCAINSCRNUR, LABBENZ, AMPHETMU, THCU, LABBARB  Alcohol Level: No results for input(s): ETH in the last 168 hours.   Imaging: Ct Cervical Spine Wo Contrast  Result Date: 10/22/2015 CLINICAL DATA:  Neck pain secondary to a fall. EXAM: CT CERVICAL SPINE WITHOUT CONTRAST TECHNIQUE: Multidetector CT imaging of the cervical spine was performed without intravenous contrast. Multiplanar CT image reconstructions were also generated. COMPARISON:  Radiographs dated 07/2013 and CT scan dated 12/23/2008 FINDINGS: No fracture or abnormal prevertebral soft tissue swelling. Alignment: No subluxation. Skull base and vertebrae: Diffuse degenerative disc disease with osteophyte formation in the vertebral endplates  throughout the cervical spine. Slight osteophyte formation in the anterior arch of C1 and the odontoid with calcification of the transverse ligament. Soft tissues and spinal canal: Calcification in the carotid arteries. Thyroid gland is atrophic with multiple tiny calcifications in the small right lobe. Diffuse narrowing of the AP dimension the spinal canal throughout the cervical spine. 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 Small broad-based disc bulges at each level, minimally progressed since 2010. No severe foraminal stenosis. Upper chest: Normal. IMPRESSION: 1. No acute abnormalities of the cervical spine. 2. Diffuse degenerative disc disease, minimally progressed since 2010. Electronically Signed   By: Lorriane Shire M.D.   On: 10/22/2015 11:14   Ct Head Code Stroke W/o Cm  Result Date: 10/22/2015 CLINICAL DATA:  Code stroke.  Fall with head injury. EXAM: CT HEAD  WITHOUT CONTRAST TECHNIQUE: Contiguous axial images were obtained from the base of the skull through the vertex without intravenous contrast. COMPARISON:  07/19/2015 FINDINGS: Brain: Mostly high-density subdural hematoma along the left cerebral convexity, greatest in the left frontal region where maximal thickness is 16 mm. There is mass effect in the left cerebral hemisphere with up to 13 mm of midline shift measures at the septum pellucidum. Asymmetric lateral ventricles with new right lateral ventricle dilatation compared to prior. No evidence of infarct or intraparenchymal hemorrhage. Vascular: No hyperdense vessel or unexpected calcification. Skull: Negative for fracture Sinuses/Orbits: No acute finding. Other: Critical Value/emergent results were called by telephone at the time of interpretation on 10/22/2015 at 11:05 am to Dr. Rudene Re , who verbally acknowledged these  results. ASPECTS [facility] Stroke Program Early CT Score) Not calculated in this setting. IMPRESSION: High-density subdural hematoma around the left cerebral convexity causing up to 13 mm midline shift. Increased volume of the right lateral ventricle compared to June 2017, likely developing entrapment. Electronically Signed   By: Monte Fantasia M.D.   On: 10/22/2015 11:08    Assessment: 70 y.o. male s/p fall.  Complains of head and neck pain with some difficulty with gait.  Code stroke called.  Initial NIHSS of 5.  Head CT personally reviewed and shows a left SDH with 22mm midline shift.  Patient not a tPA candidate.  Vitals stable.  Alert and oriented.  Will require neurosurgical evaluation.    Stroke Risk Factors - hypertension  Plan: 1. CT of cervical spine without contrast STAT 2. Cervical collar 3. Prophylactic therapy-None 4. NPO  5. Telemetry monitoring 6. Frequent neuro checks 7. Transfer to facility with neurosurgical services to be further evaluated.   8.  HOB to 30 degrees  Case discussed with Dr. Alfred Levins.  This patient is critically ill and at significant risk of neurological worsening, death and care requires constant monitoring of vital signs, hemodynamics,respiratory and cardiac monitoring, neurological assessment, discussion with family, other specialists and medical decision making of high complexity. I spent 45 minutes of neurocritical care time in the care of  this patient.  Alexis Goodell, MD Neurology [phone] 10/22/2015  11:38 AM

## 2015-10-22 NOTE — ED Notes (Signed)
MD at bedside talking with family and explaining pt condition. [facility] here to take patient

## 2015-10-22 NOTE — ED Notes (Signed)
Pt transported via [facility] to [facility]

## 2015-10-24 DIAGNOSIS — S065X9A Traumatic subdural hemorrhage with loss of consciousness of unspecified duration, initial encounter: Secondary | ICD-10-CM | POA: Insufficient documentation

## 2015-10-24 DIAGNOSIS — S065XAA Traumatic subdural hemorrhage with loss of consciousness status unknown, initial encounter: Secondary | ICD-10-CM | POA: Insufficient documentation

## 2015-10-28 DIAGNOSIS — E278 Other specified disorders of adrenal gland: Secondary | ICD-10-CM | POA: Insufficient documentation

## 2015-10-28 DIAGNOSIS — E279 Disorder of adrenal gland, unspecified: Secondary | ICD-10-CM

## 2015-11-07 ENCOUNTER — Other Ambulatory Visit: Payer: Self-pay | Admitting: Urology

## 2015-11-07 DIAGNOSIS — N400 Enlarged prostate without lower urinary tract symptoms: Secondary | ICD-10-CM

## 2015-12-21 DIAGNOSIS — Y9269 Other specified industrial and construction area as the place of occurrence of the external cause: Secondary | ICD-10-CM | POA: Insufficient documentation

## 2015-12-21 DIAGNOSIS — Z7982 Long term (current) use of aspirin: Secondary | ICD-10-CM | POA: Insufficient documentation

## 2015-12-21 DIAGNOSIS — Z79899 Other long term (current) drug therapy: Secondary | ICD-10-CM | POA: Insufficient documentation

## 2015-12-21 DIAGNOSIS — W268XXA Contact with other sharp object(s), not elsewhere classified, initial encounter: Secondary | ICD-10-CM | POA: Diagnosis not present

## 2015-12-21 DIAGNOSIS — S61210A Laceration without foreign body of right index finger without damage to nail, initial encounter: Secondary | ICD-10-CM | POA: Insufficient documentation

## 2015-12-21 DIAGNOSIS — N183 Chronic kidney disease, stage 3 (moderate): Secondary | ICD-10-CM | POA: Diagnosis not present

## 2015-12-21 DIAGNOSIS — E039 Hypothyroidism, unspecified: Secondary | ICD-10-CM | POA: Diagnosis not present

## 2015-12-21 DIAGNOSIS — I129 Hypertensive chronic kidney disease with stage 1 through stage 4 chronic kidney disease, or unspecified chronic kidney disease: Secondary | ICD-10-CM | POA: Insufficient documentation

## 2015-12-21 DIAGNOSIS — Z87891 Personal history of nicotine dependence: Secondary | ICD-10-CM | POA: Insufficient documentation

## 2015-12-21 DIAGNOSIS — Y990 Civilian activity done for income or pay: Secondary | ICD-10-CM | POA: Insufficient documentation

## 2015-12-21 DIAGNOSIS — Z7984 Long term (current) use of oral hypoglycemic drugs: Secondary | ICD-10-CM | POA: Insufficient documentation

## 2015-12-21 DIAGNOSIS — E1122 Type 2 diabetes mellitus with diabetic chronic kidney disease: Secondary | ICD-10-CM | POA: Insufficient documentation

## 2015-12-21 DIAGNOSIS — Y9389 Activity, other specified: Secondary | ICD-10-CM | POA: Insufficient documentation

## 2015-12-22 ENCOUNTER — Emergency Department: Payer: PPO

## 2015-12-22 ENCOUNTER — Emergency Department
Admission: EM | Admit: 2015-12-22 | Discharge: 2015-12-22 | Disposition: A | Discharge status: Home / Self Care | Payer: PPO | Attending: Emergency Medicine | Admitting: Emergency Medicine

## 2015-12-22 DIAGNOSIS — S61210A Laceration without foreign body of right index finger without damage to nail, initial encounter: Secondary | ICD-10-CM

## 2015-12-22 MED ORDER — BACITRACIN ZINC 500 UNIT/GM EX OINT
TOPICAL_OINTMENT | Freq: Once | CUTANEOUS | Status: DC
  Filled 2015-12-22: qty 0.9

## 2015-12-22 MED ORDER — LIDOCAINE HCL (PF) 1 % IJ SOLN
INTRAMUSCULAR | Status: AC
  Filled 2015-12-22: qty 5

## 2015-12-22 MED ORDER — BUPIVACAINE HCL (PF) 0.5 % IJ SOLN
INTRAMUSCULAR | Status: AC
  Filled 2015-12-22: qty 30

## 2015-12-22 MED ORDER — NAPROXEN 500 MG PO TABS: 500.0000 mg | ORAL_TABLET | Freq: Two times a day (BID) | ORAL | 0 refills | Status: AC

## 2015-12-22 NOTE — ED Provider Notes (Signed)
[facility] Emergency Department Provider Note  ____________________________________________  Time seen: Approximately 3:48 AM  I have reviewed the triage vital signs and the nursing notes.   HISTORY  Chief Complaint Extremity Laceration   HPI Paul Butchart [name] 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 Patient reports that at 4 PM he was working with a skill saw when he cut his R index finger. He was able to stop the bleeding by applying pressure at home. He presents now for evaluation of laceration. Endorses mild pain located at the tip of the finger, constant, non radiating since the incident. Last tetanus was 2-3 years ago.   Past Medical History:  Diagnosis Date  . Acute prostatitis   . BPH (benign prostatic hyperplasia)   . Chronic kidney disease   . Dysplasia of prostate   . Erectile dysfunction   . HTN (hypertension)   . Hypogonadism in male   . Orchitis and epididymitis   . Over weight   . Palindromic rheumatism, hand   . Prostatitis   . Rectum pain   . Testicle swelling   . Testicle tenderness   . Testicular mass     Patient Active Problem List   Diagnosis Date Noted  . Sepsis ([place]) 07/19/2015  . HTN (hypertension) 07/19/2015  . Diabetes mellitus ([place]) 07/19/2015  . Hypothyroidism 07/19/2015  . Peripheral neuropathy ([place]) 07/19/2015  . Chronic back pain 07/19/2015  . CKD (chronic kidney disease) stage 3, GFR 30-59 ml/min 07/19/2015  . Enlarged prostate 03/25/2015  . Urine stream spraying 03/25/2015  . Hypogonadism in male 09/18/2014  . BPH with obstruction/lower urinary tract symptoms 09/18/2014  . Other male erectile dysfunction 09/18/2014    Past Surgical History:  Procedure Laterality Date  . BACK SURGERY      Prior to Admission medications   Medication Sig Start Date End Date Taking? Authorizing Provider  acetaminophen (TYLENOL) 650 MG suppository Place 1  suppository (650 mg total) rectally every 6 (six) hours as needed for mild pain (or Fever >/= 101). 07/21/15   Epifanio Lesches, MD  amLODipine (NORVASC) 2.5 MG tablet Take 1 tablet (2.5 mg total) by mouth daily. 07/21/15   Epifanio Lesches, MD  aspirin 325 MG tablet Take 325 mg by mouth daily.     Historical Provider, MD  finasteride (PROSCAR) 5 MG tablet TAKE ONE TABLET BY MOUTH ONCE DAILY 11/09/15   Larene Beach A McGowan, PA-C  gabapentin (NEURONTIN) 300 MG capsule Take 1 capsule by mouth at bedtime.  06/30/15   Historical Provider, MD  levothyroxine (SYNTHROID, LEVOTHROID) 175 MCG tablet Take 175 mcg by mouth daily before breakfast.  05/22/15   Historical Provider, MD  lisinopril-hydrochlorothiazide (PRINZIDE,ZESTORETIC) 20-12.5 MG per tablet Take 1 tablet by mouth daily.    Historical Provider, MD  metFORMIN (GLUCOPHAGE) 1000 MG tablet Take 1,000 mg by mouth 2 (two) times daily with a meal.    Historical Provider, MD  methocarbamol (ROBAXIN) 750 MG tablet Take 750 mg by mouth every 8 (eight) hours as needed. Reported on 06/02/2015 04/15/15   Historical Provider, MD  metoprolol succinate (TOPROL-XL) 100 MG 24 hr tablet Take 100 mg by mouth daily. Take with or immediately following a meal.    Historical Provider, MD  rosuvastatin (CRESTOR) 10 MG tablet 1 tablet daily. 08/19/15   Historical Provider, MD  spironolactone (ALDACTONE) 100 MG tablet Take 100 mg by mouth daily.     Historical Provider, MD  tamsulosin (  FLOMAX) 0.4 MG CAPS capsule Take 0.4 mg by mouth daily. Reported on 06/02/2015    Historical Provider, MD  traMADol (ULTRAM) 50 MG tablet Take 50 mg by mouth daily. Reported on 06/02/2015    Historical Provider, MD  vardenafil (LEVITRA) 20 MG tablet Take 20 mg by mouth daily as needed for erectile dysfunction. Reported on 06/02/2015    Historical Provider, MD    Allergies Enalapril maleate  Family History  Problem Relation Age of Onset  . Cancer Maternal Aunt   . Prostate cancer Neg Hx   .  Kidney disease Neg Hx     Social History Social History  Substance Use Topics  . Smoking status: Former Research scientist (life sciences)  . Smokeless tobacco: Not on file     Comment: quit 30 years ago  . Alcohol use No    Review of Systems  Constitutional: Negative for fever. Eyes: Negative for visual changes. ENT: Negative for sore throat. Neck: No neck pain  Cardiovascular: Negative for chest pain. Respiratory: Negative for shortness of breath. Gastrointestinal: Negative for abdominal pain, vomiting or diarrhea. Genitourinary: Negative for dysuria. Musculoskeletal: Negative for back pain. Skin: + R index laceration Neurological: Negative for headaches, weakness or numbness. Psych: No SI or HI  ____________________________________________   PHYSICAL EXAM:  VITAL SIGNS: ED Triage Vitals  Enc Vitals Group     BP 12/22/15 0025 (!) 158/82     Pulse Rate 12/22/15 0025 80     Resp 12/22/15 0025 18     Temp 12/22/15 0025 98.3 F (36.8 C)     Temp Source 12/22/15 0025 Oral     SpO2 12/22/15 0025 98 %     Weight 12/22/15 0042 235 lb (106.6 kg)     Height 12/22/15 0042 6\' 1"  (1.854 m)     Head Circumference --      Peak Flow --      Pain Score 12/22/15 0042 2     Pain Loc --      Pain Edu? --      Excl. in [place]? --     Constitutional: Alert and oriented. Well appearing and in no apparent distress. HEENT:      Head: Normocephalic and atraumatic.         Eyes: Conjunctivae are normal. Sclera is non-icteric. EOMI. PERRL      Mouth/Throat: Mucous membranes are moist.       Neck: Supple with no signs of meningismus. Cardiovascular: Regular rate and rhythm. No murmurs, gallops, or rubs. 2+ symmetrical distal pulses are present in all extremities. No JVD. Respiratory: Normal respiratory effort. Lungs are clear to auscultation bilaterally. No wheezes, crackles, or rhonchi.  Genitourinary: No CVA tenderness. Musculoskeletal: Nontender with normal range of motion in all extremities. No edema,  cyanosis, or erythema of extremities. Neurologic: Normal speech and language. Face is symmetric. Moving all extremities. No gross focal neurologic deficits are appreciated. Skin: There is a complex shallow stellate laceration on the palmar aspect of the R index finger. Decrease sensation to light touch on the tip of the index finger. Impaired two point discrimination on the tip of the finger. Intact tendons, intact flexion and extension of PIP and DIP. Psychiatric: Mood and affect are normal. Speech and behavior are normal.  ____________________________________________   LABS (all labs ordered are listed, but only abnormal results are displayed)  Labs Reviewed - No data to display ____________________________________________  EKG  none  ____________________________________________  RADIOLOGY  XR of R hand: No fracture or malalignment 2. 3  mm metallic density superimposes soft tissues adjacent to the proximal shaft of the second proximal phalanx. ____________________________________________   PROCEDURES  Procedure(s) performed:yes Procedures   LACERATION REPAIR Performed by: Rudene Re Authorized by: Rudene Re Consent: Verbal consent obtained. Risks and benefits: risks, benefits and alternatives were discussed Consent given by: patient Patient identity confirmed: provided demographic data Prepped and Draped in normal sterile fashion Wound explored  Laceration Location: palmar aspect of R index finger  Laceration Length: 2.0 cm  No Foreign Bodies seen or palpated  Anesthesia: digital block  Local anesthetic: lidocaine 1% wo epinephrine  Anesthetic total: 5 ml  Irrigation method: syringe Amount of cleaning: standard  Skin closure: none  Number of sutures: none  Technique: wound explored to evaluate for integrity of tendons and foreign body, skin missing and shallow laceration with no stitches placed.  Patient tolerance: Patient tolerated the  procedure well with no immediate complications.  Critical Care performed:  None ____________________________________________   INITIAL IMPRESSION / ASSESSMENT AND PLAN / ED COURSE  70 y.o. male who presents for evaluation of a laceration. Intact tendons, impaired 2 point discrimination, normal flexion and extension of PIP and DIP. XR with no evidence of fracture. Foreign body seen in XR overlying second proximal phalanx where there is no laceration. Tetanus up to date. Digital block was performed for wound exploration. Laceration shallow with no tendon involvement, no foreign bodies, missing skin and therefore no stitches were placed. Bacitracin was applied and wound was dressed. Patient was recommended to avoid using that hand at work unless dressing and gloves are in place. Close f/u with PCP for wound recheck  Clinical Course     Pertinent labs & imaging results that were available during my care of the patient were reviewed by me and considered in my medical decision making (see chart for details).    ____________________________________________   FINAL CLINICAL IMPRESSION(S) / ED DIAGNOSES  Final diagnoses:  Laceration of right index finger without foreign body without damage to nail, initial encounter      NEW MEDICATIONS STARTED DURING THIS VISIT:  New Prescriptions   No medications on file     Note:  This document was prepared using Dragon voice recognition software and may include unintentional dictation errors.    Rudene Re, MD 12/22/15 [phone]

## 2015-12-22 NOTE — ED Notes (Signed)

## 2015-12-22 NOTE — ED Triage Notes (Signed)
Pt here after a skill saw hit his rt index finger while he working on a shelf.

## 2015-12-22 NOTE — Discharge Instructions (Signed)
Keep laceration dry and clean. Wash with warm water and soap. Apply topical bacitracin. Protect from the sun to minimize scarring. Watch for signs of infection: pus, redness of the skin surrounding it, or fever. If these develop see your doctor or return to the ER for antibiotics. Follow up with PCP in two days for wound re-check. Keep it covered with dressing and glove while working to prevent it from getting infected.

## 2016-02-03 DIAGNOSIS — M5416 Radiculopathy, lumbar region: Secondary | ICD-10-CM | POA: Diagnosis not present

## 2016-02-03 DIAGNOSIS — E1140 Type 2 diabetes mellitus with diabetic neuropathy, unspecified: Secondary | ICD-10-CM | POA: Diagnosis not present

## 2016-02-03 DIAGNOSIS — M47896 Other spondylosis, lumbar region: Secondary | ICD-10-CM | POA: Diagnosis not present

## 2016-02-03 DIAGNOSIS — M5417 Radiculopathy, lumbosacral region: Secondary | ICD-10-CM | POA: Diagnosis not present

## 2016-02-03 DIAGNOSIS — M545 Low back pain: Secondary | ICD-10-CM | POA: Diagnosis not present

## 2016-02-03 DIAGNOSIS — M48061 Spinal stenosis, lumbar region without neurogenic claudication: Secondary | ICD-10-CM | POA: Diagnosis not present

## 2016-02-25 ENCOUNTER — Other Ambulatory Visit: Payer: Self-pay

## 2016-02-25 DIAGNOSIS — N401 Enlarged prostate with lower urinary tract symptoms: Secondary | ICD-10-CM

## 2016-02-26 ENCOUNTER — Other Ambulatory Visit: Payer: Medicare HMO

## 2016-02-26 DIAGNOSIS — N401 Enlarged prostate with lower urinary tract symptoms: Secondary | ICD-10-CM | POA: Diagnosis not present

## 2016-02-27 LAB — PSA: Prostate Specific Ag, Serum: 0.1 ng/mL (ref 0.0–4.0)

## 2016-02-27 IMAGING — MR MR LUMBAR SPINE W/O CM
2 series · 35 of 48 positions shown · non-contrast
Comparison: 03/14/2013

CLINICAL DATA: Low back pain. Bilateral buttock pain. Symptoms
remotely and more recently over the past 6 weeks.

EXAM:
MRI LUMBAR SPINE WITHOUT CONTRAST
TECHNIQUE: Multiplanar, multisequence MR imaging of the lumbar spine was
performed. No intravenous contrast was administered.

[Series 5: T2 · axial · 4.0mm · 0.78mm/px · z∈[-190,+66]mm · 24 of 46 slices shown]
[im 1/46]
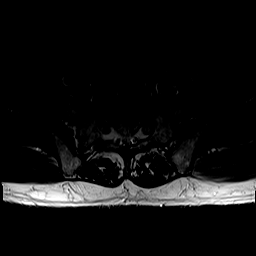
[im 2/46]
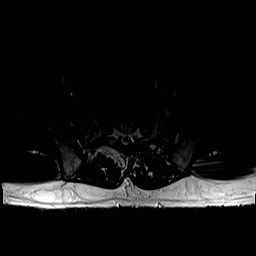
[im 4/46]
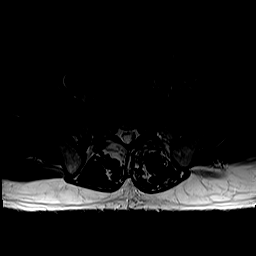
[im 6/46]
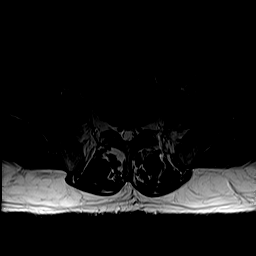
[im 8/46]
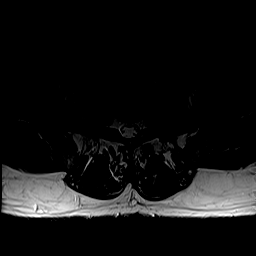
[im 10/46]
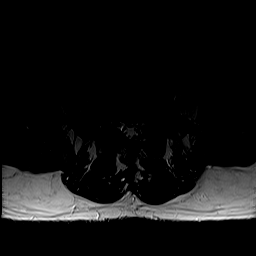
[im 12/46]
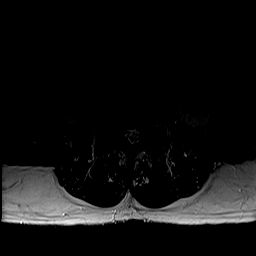
[im 14/46]
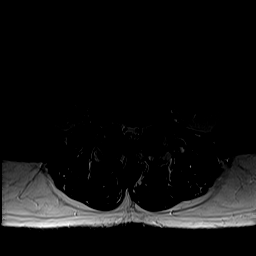
[im 16/46]
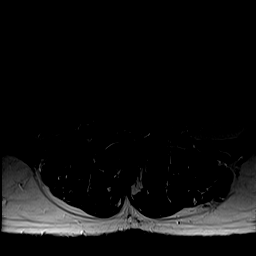
[im 18/46]
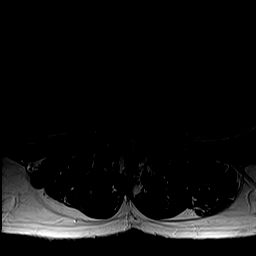
[im 20/46]
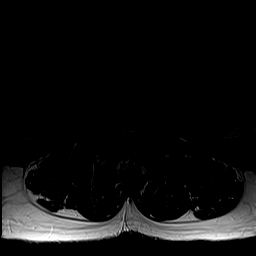
[im 22/46]
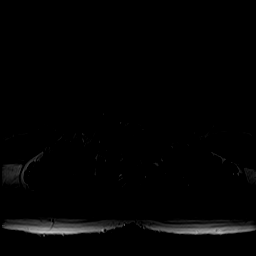
[im 24/46]
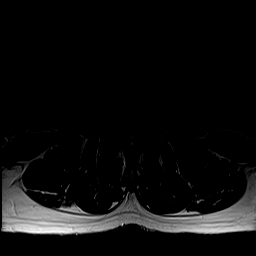
[im 26/46]
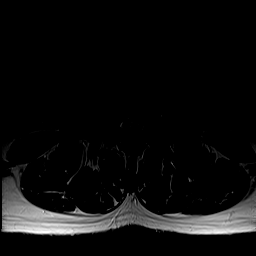
[im 28/46]
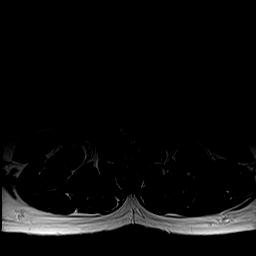
[im 30/46]
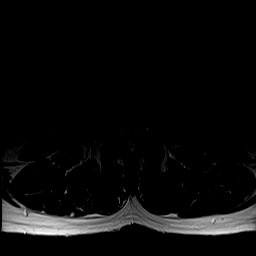
[im 32/46]
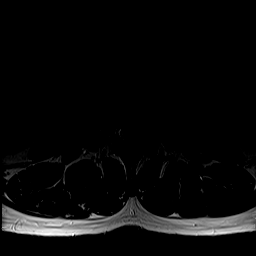
[im 34/46]
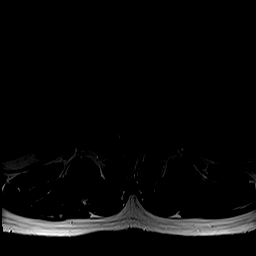
[im 36/46]
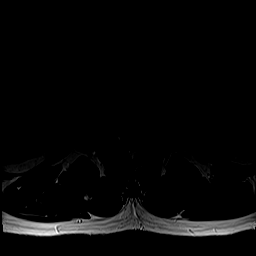
[im 38/46]
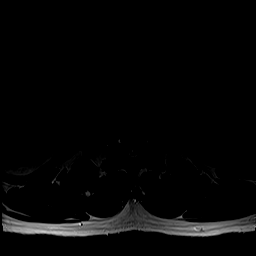
[im 40/46]
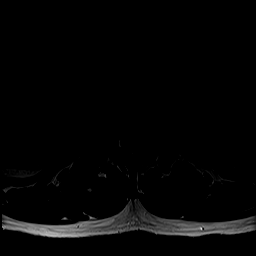
[im 42/46]
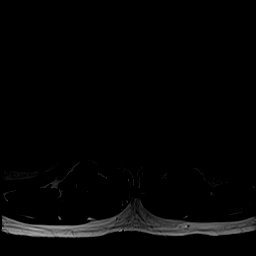
[im 44/46]
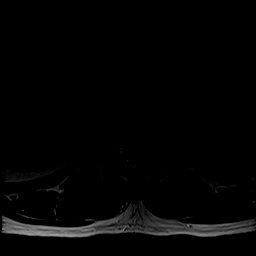
[im 46/46]
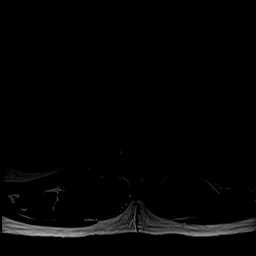

[Series 6: T1 · axial · 4.0mm · 0.39mm/px · z∈[-190,+61]mm · 11 of 46 slices shown]
[im 1/46]
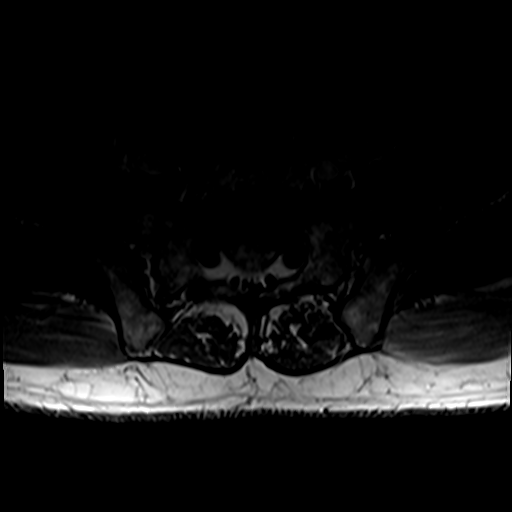
[im 2/46]
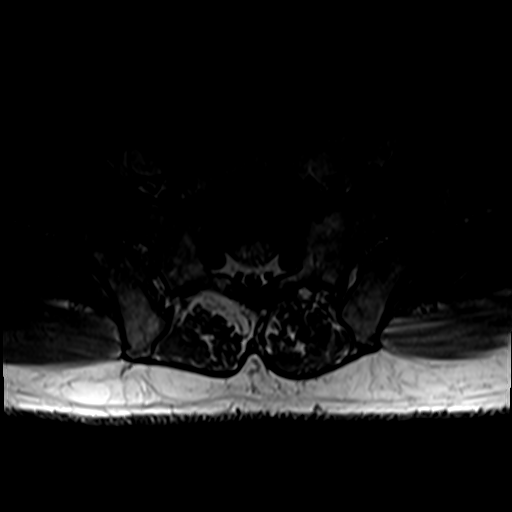
[im 8/46]
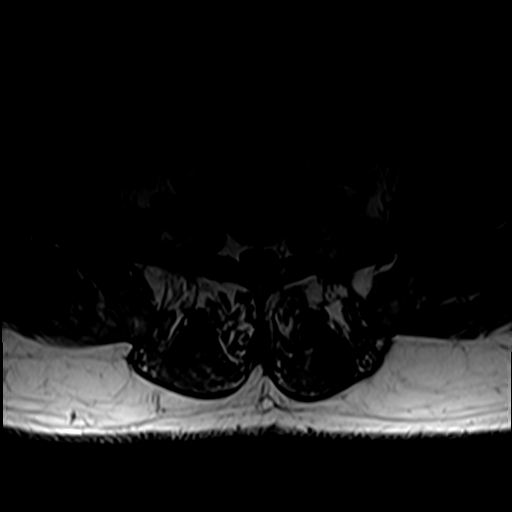
[im 14/46]
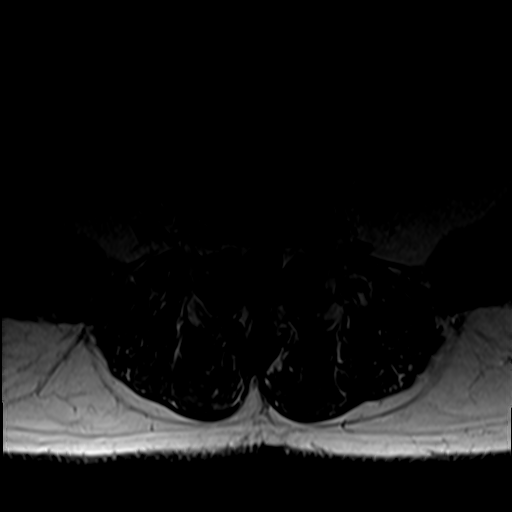
[im 20/46]
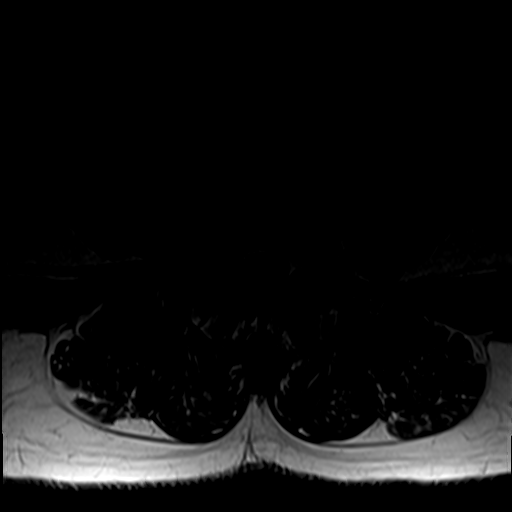
[im 24/46]
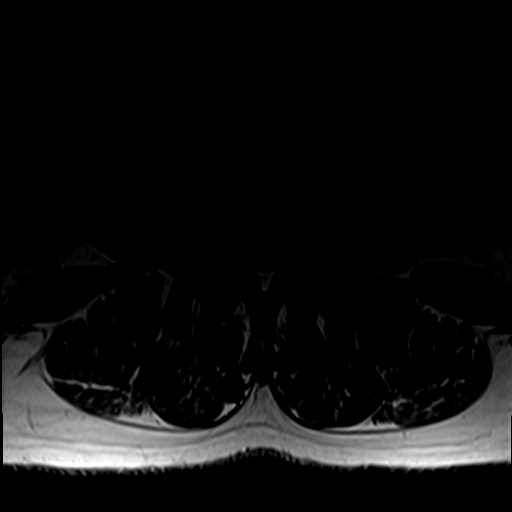
[im 26/46]
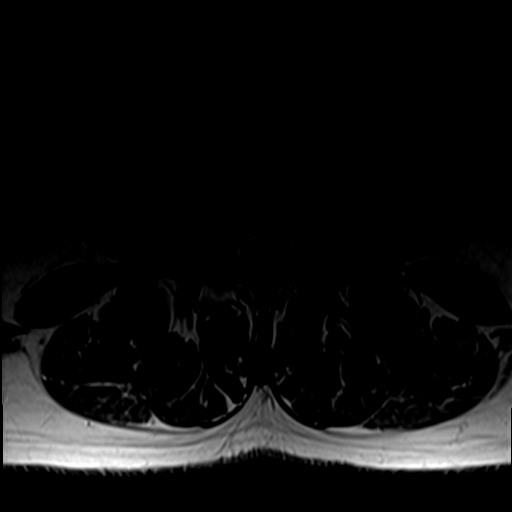
[im 32/46]
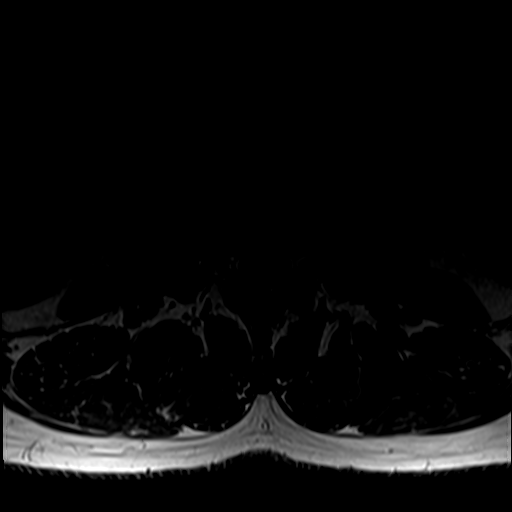
[im 38/46]
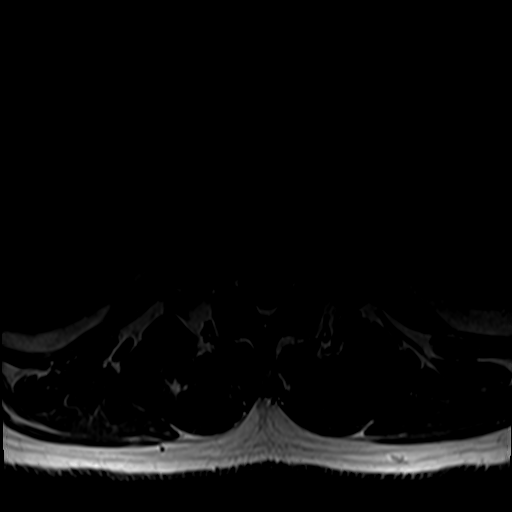
[im 40/46]
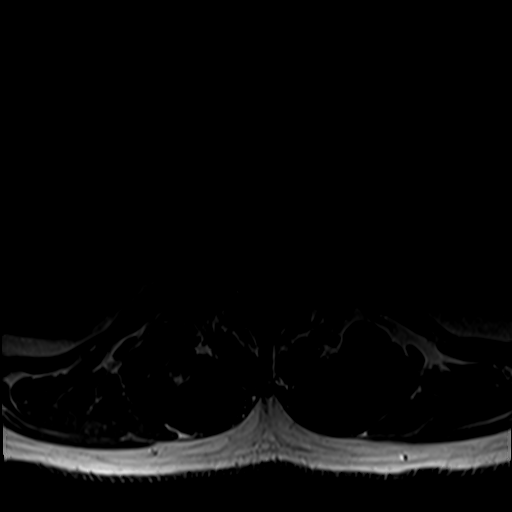
[im 44/46]
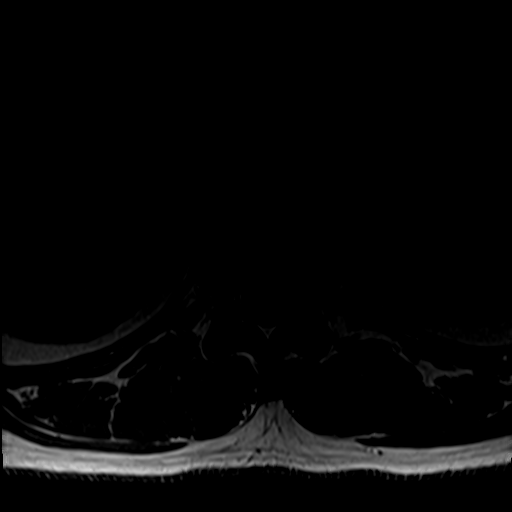

[35 of 48 positions shown; findings below may reference images not displayed]

FINDINGS: A transitional lumbosacral vertebra is assumed to represent the S1
level. Careful correlation with this numbering strategy prior to any
procedural intervention would be recommended. The conus medullaris
appears normal. Conus level: L1-2.

Intervertebral disc desiccation is observed at all levels between L3
and S1.

There is 5 mm anterolisthesis at L4-5 and 3 mm retrolisthesis at
L5-S1, both associated with degenerative facet arthropathy. No pars
defects identified.

Mildly congenitally short pedicles in the lumbar spine. Degenerative
facet edema at L4-5 bilaterally.

Fluid signal intensity lesions of the right kidney likely
representing cysts.

Additional findings at individual levels are as follows:

L1-2:  Unremarkable.

L2-3:  Unremarkable.

L3-4: Moderate left and mild right subarticular lateral recess
stenosis with mild central narrowing of the thecal sac and mild
bilateral foraminal stenosis due to disc bulge, facet arthropathy,
ligamentum flavum redundancy, and a broad left lateral recess and
foraminal disc protrusion. Disc bulge mildly displaces the left L3
nerve in the lateral extraforaminal space. Impingement similar to
prior.

L4-5: Prominent central narrowing of the thecal sac with moderate
right and mild left foraminal stenosis and mild to moderate
bilateral subarticular lateral recess stenosis due to facet
arthropathy, ligamentum flavum redundancy, disc uncovering, and disc
bulge. Impingement mildly worsened compared to prior.

L5-S1: Moderate right and mild left subarticular lateral recess
stenosis with mild central narrowing of the thecal sac due to disc
bulge, slightly right paracentral disc protrusion, and facet
arthropathy. Prior left laminectomy. Impingement similar to prior.

S1-2:  No impingement.  The S1 vertebra is transitional.
IMPRESSION: 1. Lumbar spondylosis, degenerative disc disease, and congenitally
short pedicles causing prominent impingement at L4-5, and moderate
impingement at L3-4 and L5-S1, as detailed above. The impingement at
L4-5 appears mildly worsened compared to the 03/14/2013 exam.
2. A transitional lumbosacral vertebra is assumed to represent the
S1 level. Careful correlation with this numbering strategy prior to
any procedural intervention would be recommended.

## 2016-03-01 DIAGNOSIS — M5417 Radiculopathy, lumbosacral region: Secondary | ICD-10-CM | POA: Diagnosis not present

## 2016-03-01 DIAGNOSIS — E1140 Type 2 diabetes mellitus with diabetic neuropathy, unspecified: Secondary | ICD-10-CM | POA: Diagnosis not present

## 2016-03-01 DIAGNOSIS — M48061 Spinal stenosis, lumbar region without neurogenic claudication: Secondary | ICD-10-CM | POA: Diagnosis not present

## 2016-03-01 DIAGNOSIS — M545 Low back pain: Secondary | ICD-10-CM | POA: Diagnosis not present

## 2016-03-01 DIAGNOSIS — M47896 Other spondylosis, lumbar region: Secondary | ICD-10-CM | POA: Diagnosis not present

## 2016-03-01 DIAGNOSIS — M5416 Radiculopathy, lumbar region: Secondary | ICD-10-CM | POA: Diagnosis not present

## 2016-03-04 ENCOUNTER — Encounter: Payer: Self-pay | Admitting: Urology

## 2016-03-04 ENCOUNTER — Ambulatory Visit (INDEPENDENT_AMBULATORY_CARE_PROVIDER_SITE_OTHER): Payer: Medicare HMO | Admitting: Urology

## 2016-03-04 ENCOUNTER — Other Ambulatory Visit: Payer: Self-pay

## 2016-03-04 VITALS — BP 162/88 | HR 66 | Ht 72.0 in | Wt 248.4 lb

## 2016-03-04 DIAGNOSIS — N529 Male erectile dysfunction, unspecified: Secondary | ICD-10-CM

## 2016-03-04 DIAGNOSIS — E291 Testicular hypofunction: Secondary | ICD-10-CM | POA: Diagnosis not present

## 2016-03-04 DIAGNOSIS — N401 Enlarged prostate with lower urinary tract symptoms: Secondary | ICD-10-CM | POA: Diagnosis not present

## 2016-03-04 DIAGNOSIS — N138 Other obstructive and reflux uropathy: Secondary | ICD-10-CM | POA: Diagnosis not present

## 2016-03-04 MED ORDER — TAMSULOSIN HCL 0.4 MG PO CAPS: 0.4000 mg | ORAL_CAPSULE | Freq: Every day | ORAL | 3 refills | Status: DC

## 2016-03-04 MED ORDER — FINASTERIDE 5 MG PO TABS: 5.0000 mg | ORAL_TABLET | Freq: Every day | ORAL | 3 refills | Status: DC

## 2016-03-04 NOTE — Progress Notes (Signed)
03/04/2016 11:01 AM   [name] November 14, 1945 [id number]  Referring provider: Cletis Athens, MD [address] [place], [place] [address]  Chief Complaint  Patient presents with  . Benign Prostatic Hypertrophy    9 month follow up  . 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  Hypogonadism Patient is experiencing a decrease in libido, a lack of energy, a decreased enjoyment in life, sadness and/or grumpiness, erections being less strong, falling asleep after dinner and a recent deterioration in their work performance.  This is indicated by his responses to the ADAM questionnaire.  He is no longer having spontaneous erections at night.  He does not have sleep apnea.   His most recent testosterone level was 323 ng/mL on 03/27/2015.  He is not on testosterone therapy at this time.      Androgen Deficiency in the Aging Male    [place] Name 03/04/16 1000         Androgen Deficiency in the Aging Male   Do you have a decrease in libido (sex drive) Yes     Do you have lack of energy Yes     Do you have a decrease in strength and/or endurance Yes     Have you lost height No     Have you noticed a decreased "enjoyment of life" Yes     Are you sad and/or grumpy Yes     Are your erections less strong Yes     Have you noticed a recent deterioration in your ability to play sports No     Are you falling asleep after dinner Yes     Has there been a recent deterioration in your work performance Yes       Erectile dysfunction His SHIM score is 7, which is severe ED.   He has been having difficulty with erections for several years.   His major complaint is achieving an erection.  His libido is diminished.   His risk factors for ED are age, BPH, hypogonadism, DM, HTN, HLD and blood pressure medications.   He denies any painful erections or curvatures with  his erections.   He has tried PDE5i in the past.       [place] Name 03/04/16 1035         SHIM: Over the last 6 months:   How do you rate your confidence that you could get and keep an erection? Moderate     When you had erections with sexual stimulation, how often were your erections hard enough for penetration (entering your partner)? Almost Never or Never     During sexual intercourse, how often were you able to maintain your erection after you had penetrated (entered) your partner? Extremely Difficult     During sexual intercourse, how difficult was it to maintain your erection to completion of intercourse? Extremely Difficult     When you attempted sexual intercourse, how often was it satisfactory for you? Extremely Difficult       SHIM Total Score   SHIM 7        Score: 1-7 Severe ED 8-11 Moderate ED 12-16 Mild-Moderate ED 17-21 Mild ED 22-25 No ED    BPH WITH LUTS His IPSS score today is 17, which is moderate lower urinary tract symptomatology.  He is mixed with his quality  life due to his urinary symptoms.   His previous IPSS score was 1/2.  His previous PVR is 0 mL.    His major complaints today are nocturia x 3, intermittency and hesitancy.  He has had these symptoms for several years.  He denies any dysuria, hematuria or suprapubic pain.   He currently taking tamsulosin 0.4 mg and finasteride 5 mg daily.  He also denies any recent fevers, chills, nausea or vomiting.  He does not have a family history of PCa.      IPSS    [place] Name 03/04/16 1000         International Prostate Symptom Score   How often have you had the sensation of not emptying your bladder? More than half the time     How often have you had to urinate less than every two hours? About half the time     How often have you found you stopped and started again several times when you urinated? About half the time     How often have you found it difficult to postpone urination? Less than half the  time     How often have you had a weak urinary stream? Less than 1 in 5 times     How often have you had to strain to start urination? Less than 1 in 5 times     How many times did you typically get up at night to urinate? 3 Times     Total IPSS Score 17       Quality of Life due to urinary symptoms   If you were to spend the rest of your life with your urinary condition just the way it is now how would you feel about that? Mixed        Score:  1-7 Mild 8-19 Moderate 20-35 Severe    PMH: Past Medical History:  Diagnosis Date  . Acute prostatitis   . BPH (benign prostatic hyperplasia)   . Chronic kidney disease   . Dysplasia of prostate   . Erectile dysfunction   . HTN (hypertension)   . Hypogonadism in male   . Orchitis and epididymitis   . Over weight   . Palindromic rheumatism, hand   . Prostatitis   . Rectum pain   . Testicle swelling   . Testicle tenderness   . Testicular mass     Surgical History: Past Surgical History:  Procedure Laterality Date  . BACK SURGERY    . head surgery     from fall  drilled hole in brain to releive pressure    Home Medications:  Allergies as of 03/04/2016      Reactions   Enalapril Maleate Other (See Comments)      Medication List       Accurate as of 03/04/16 11:01 AM. Always use your most recent med list.          acetaminophen 650 MG suppository Commonly known as:  TYLENOL Place 1 suppository (650 mg total) rectally every 6 (six) hours as needed for mild pain (or Fever >/= 101).   aspirin 325 MG tablet Take 325 mg by mouth daily.   carvedilol 25 MG tablet Commonly known as:  COREG Take 25 mg by mouth.   cyclobenzaprine 10 MG tablet Commonly known as:  FLEXERIL Take 10 mg by mouth.   finasteride 5 MG tablet Commonly known as:  PROSCAR Take 1 tablet (5 mg total) by mouth daily.   gabapentin 300 MG capsule  Commonly known as:  NEURONTIN Take 1 capsule by mouth at bedtime.   LEVOTHYROXINE SODIUM PO Take by  mouth.   lisinopril-hydrochlorothiazide 20-12.5 MG tablet Commonly known as:  PRINZIDE,ZESTORETIC Take 1 tablet by mouth daily.   metFORMIN 1000 MG tablet Commonly known as:  GLUCOPHAGE Take 1,000 mg by mouth.   methocarbamol 750 MG tablet Commonly known as:  ROBAXIN Take 750 mg by mouth every 8 (eight) hours as needed. Reported on 06/02/2015   metoprolol succinate 100 MG 24 hr tablet Commonly known as:  TOPROL-XL Take 100 mg by mouth daily. Take with or immediately following a meal.   naproxen 500 MG tablet Commonly known as:  NAPROSYN Take 1 tablet (500 mg total) by mouth 2 (two) times daily with a meal.   oxyCODONE-acetaminophen 5-325 MG tablet Commonly known as:  PERCOCET/ROXICET Take by mouth every 4 (four) hours as needed for severe pain.   rosuvastatin 10 MG tablet Commonly known as:  CRESTOR 1 tablet daily.   spironolactone 100 MG tablet Commonly known as:  ALDACTONE Take 100 mg by mouth daily.   tamsulosin 0.4 MG Caps capsule Commonly known as:  FLOMAX Take 1 capsule (0.4 mg total) by mouth daily. Reported on 06/02/2015   traMADol 50 MG tablet Commonly known as:  ULTRAM Take 50 mg by mouth daily. Reported on 06/02/2015   vardenafil 20 MG tablet Commonly known as:  LEVITRA Take 20 mg by mouth daily as needed for erectile dysfunction. Reported on 06/02/2015       Allergies:  Allergies  Allergen Reactions  . Enalapril Maleate Other (See Comments)    Family History: Family History  Problem Relation Age of Onset  . Cancer Maternal Aunt   . Prostate cancer Neg Hx   . Kidney disease Neg Hx     Social History:  reports that he has quit smoking. He has never used smokeless tobacco. He reports that he does not drink alcohol or use drugs.  ROS: UROLOGY Frequent Urination?: No Hard to postpone urination?: No Burning/pain with urination?: No Get up at night to urinate?: Yes Leakage of urine?: No Urine stream starts and stops?: Yes Trouble starting stream?:  Yes Do you have to strain to urinate?: No Blood in urine?: No Urinary tract infection?: No Sexually transmitted disease?: No Injury to kidneys or bladder?: No Painful intercourse?: No Weak stream?: No Erection problems?: No Penile pain?: Yes  Gastrointestinal Nausea?: No Vomiting?: No Indigestion/heartburn?: No Diarrhea?: No Constipation?: No  Constitutional Fever: No Night sweats?: No Weight loss?: No Fatigue?: No  Skin Skin rash/lesions?: No Itching?: No  Eyes Blurred vision?: No Double vision?: No  Ears/Nose/Throat Sore throat?: No Sinus problems?: No  Hematologic/Lymphatic Swollen glands?: No Easy bruising?: No  Cardiovascular Leg swelling?: No Chest pain?: No  Respiratory Cough?: No Shortness of breath?: No  Endocrine Excessive thirst?: No  Musculoskeletal Back pain?: No Joint pain?: No  Neurological Headaches?: No Dizziness?: No  Psychologic Depression?: No Anxiety?: No  Physical Exam: BP (!) 162/88   Pulse 66   Ht 6' (1.829 m)   Wt 248 lb 6.4 oz (112.7 kg)   BMI 33.69 kg/m   Constitutional: Well nourished. Alert and oriented, No acute distress. HEENT: [place] AT, moist mucus membranes. Trachea midline, no masses. Cardiovascular: No clubbing, cyanosis, or edema. Respiratory: Normal respiratory effort, no increased work of breathing. GI: Abdomen is soft, non tender, non distended, no abdominal masses. Liver and spleen not palpable.  No hernias appreciated.  Stool sample for occult testing is  not indicated.   GU: No CVA tenderness.  No bladder fullness or masses.  Patient with circumcised phallus.   Urethral meatus is patent.  No penile discharge. No penile lesions or rashes. Scrotum without lesions, cysts, rashes and/or edema.  Testicles are located scrotally bilaterally. No masses are appreciated in the testicles. Left and right epididymis are normal. Rectal: Patient with  normal sphincter tone. Anus and perineum without scarring or  rashes. No rectal masses are appreciated. Prostate is approximately 50 grams, no nodules are appreciated. Seminal vesicles are normal. Skin: No rashes, bruises or suspicious lesions. Lymph: No cervical or inguinal adenopathy. Neurologic: Grossly intact, no focal deficits, moving all 4 extremities. Psychiatric: Normal mood and affect.  Laboratory Data: Lab Results  Component Value Date   WBC 12.9 (H) 10/22/2015   HGB 11.6 (L) 10/22/2015   HCT 34.7 (L) 10/22/2015   MCV 84.5 10/22/2015   PLT 155 10/22/2015    Lab Results  Component Value Date   CREATININE 1.25 (H) 10/22/2015   PSA 0.1 ng/mL on 02/27/2016   Lab Results  Component Value Date   TESTOSTERONE 323 (L) 03/27/2015    Lab Results  Component Value Date   TSH 7.19 (H) 09/27/2011    Lab Results  Component Value Date   AST 47 (H) 10/22/2015   Lab Results  Component Value Date   ALT 31 10/22/2015    Assessment & Plan:    1. Hypogonadism:     -most recent testosterone level is 323 ng/dL on  03/27/2015  - RTC tomorrow prior to 10 AM on Friday and Monday  2. BPH with LUTS  - IPSS score is 17/3, it is worsening - this is due to his ED  - Continue conservative management, avoiding bladder irritants and timed voiding's  - Continue tamsulosin 0.4 mg daily and finasteride 5 mg daily; refills given  - RTC in 6 months for IPSS, PSA and exam, as testosterone therapy can cause prostate enlargement and worsen LUTS  3. Erectile dysfunction:     -SHIM score is 7  -continue Viagra  -RTC in 6 months for SHIM score and exam, as testosterone therapy can affect erections   Return for testosterone before 10 AM tomorrow and Monday.  These notes generated with voice recognition software. I apologize for typographical errors.  Zara Council, [facility] [address] [place], [place] [address] [phone]

## 2016-03-05 ENCOUNTER — Other Ambulatory Visit: Payer: Medicare HMO

## 2016-03-05 DIAGNOSIS — E291 Testicular hypofunction: Secondary | ICD-10-CM | POA: Diagnosis not present

## 2016-03-06 LAB — TESTOSTERONE: TESTOSTERONE: 409 ng/dL (ref 264–916)

## 2016-03-08 ENCOUNTER — Other Ambulatory Visit: Payer: Self-pay

## 2016-03-08 ENCOUNTER — Other Ambulatory Visit: Payer: Medicare HMO

## 2016-03-08 ENCOUNTER — Telehealth: Payer: Self-pay

## 2016-03-08 DIAGNOSIS — E291 Testicular hypofunction: Secondary | ICD-10-CM

## 2016-03-08 NOTE — Telephone Encounter (Signed)
Spoke with pt in reference to testosterone results and viagra. Pt stated that viagra is working at this point.

## 2016-03-08 NOTE — Telephone Encounter (Signed)
-----   Message from [name], PA-C sent at 03/06/2016 12:08 PM EST ----- Please notify the patient that his testosterone is normal and that low testosterone is not a contributing factor to his erectile dysfunction.  If the Viagra effective for him, he may schedule an office visit to discuss other treatments for erectile dysfunction.

## 2016-03-09 ENCOUNTER — Telehealth: Payer: Self-pay

## 2016-03-09 LAB — TESTOSTERONE: TESTOSTERONE: 480 ng/dL (ref 264–916)

## 2016-03-09 NOTE — Telephone Encounter (Signed)
LMOM- testosterone labs are normal

## 2016-03-09 NOTE — Telephone Encounter (Signed)
-----   Message from [name], PA-C sent at 03/09/2016  8:08 AM EST ----- Please notify the patient that his testosterone is normal.

## 2016-04-19 DIAGNOSIS — M25511 Pain in right shoulder: Secondary | ICD-10-CM | POA: Diagnosis not present

## 2016-04-19 DIAGNOSIS — M545 Low back pain: Secondary | ICD-10-CM | POA: Diagnosis not present

## 2016-04-19 DIAGNOSIS — M47896 Other spondylosis, lumbar region: Secondary | ICD-10-CM | POA: Diagnosis not present

## 2016-04-19 DIAGNOSIS — M25512 Pain in left shoulder: Secondary | ICD-10-CM | POA: Diagnosis not present

## 2016-04-19 DIAGNOSIS — M48061 Spinal stenosis, lumbar region without neurogenic claudication: Secondary | ICD-10-CM | POA: Diagnosis not present

## 2016-04-19 DIAGNOSIS — M5417 Radiculopathy, lumbosacral region: Secondary | ICD-10-CM | POA: Diagnosis not present

## 2016-04-19 DIAGNOSIS — M5416 Radiculopathy, lumbar region: Secondary | ICD-10-CM | POA: Diagnosis not present

## 2016-05-19 DIAGNOSIS — Z79891 Long term (current) use of opiate analgesic: Secondary | ICD-10-CM | POA: Diagnosis not present

## 2016-05-19 DIAGNOSIS — M5417 Radiculopathy, lumbosacral region: Secondary | ICD-10-CM | POA: Diagnosis not present

## 2016-05-19 DIAGNOSIS — M25512 Pain in left shoulder: Secondary | ICD-10-CM | POA: Diagnosis not present

## 2016-05-19 DIAGNOSIS — M5416 Radiculopathy, lumbar region: Secondary | ICD-10-CM | POA: Diagnosis not present

## 2016-05-19 DIAGNOSIS — M48061 Spinal stenosis, lumbar region without neurogenic claudication: Secondary | ICD-10-CM | POA: Diagnosis not present

## 2016-05-19 DIAGNOSIS — M47896 Other spondylosis, lumbar region: Secondary | ICD-10-CM | POA: Diagnosis not present

## 2016-05-19 DIAGNOSIS — M545 Low back pain: Secondary | ICD-10-CM | POA: Diagnosis not present

## 2016-05-19 DIAGNOSIS — G894 Chronic pain syndrome: Secondary | ICD-10-CM | POA: Diagnosis not present

## 2016-05-19 DIAGNOSIS — M25511 Pain in right shoulder: Secondary | ICD-10-CM | POA: Diagnosis not present

## 2016-06-10 DIAGNOSIS — Z0189 Encounter for other specified special examinations: Secondary | ICD-10-CM | POA: Diagnosis not present

## 2016-06-12 DIAGNOSIS — M25511 Pain in right shoulder: Secondary | ICD-10-CM | POA: Diagnosis not present

## 2016-06-12 DIAGNOSIS — M25512 Pain in left shoulder: Secondary | ICD-10-CM | POA: Diagnosis not present

## 2016-06-18 DIAGNOSIS — M19011 Primary osteoarthritis, right shoulder: Secondary | ICD-10-CM | POA: Diagnosis not present

## 2016-06-23 DIAGNOSIS — M25512 Pain in left shoulder: Secondary | ICD-10-CM | POA: Diagnosis not present

## 2016-06-23 DIAGNOSIS — M25511 Pain in right shoulder: Secondary | ICD-10-CM | POA: Diagnosis not present

## 2016-07-08 DIAGNOSIS — M5417 Radiculopathy, lumbosacral region: Secondary | ICD-10-CM | POA: Diagnosis not present

## 2016-07-08 DIAGNOSIS — M47896 Other spondylosis, lumbar region: Secondary | ICD-10-CM | POA: Diagnosis not present

## 2016-07-08 DIAGNOSIS — M25512 Pain in left shoulder: Secondary | ICD-10-CM | POA: Diagnosis not present

## 2016-07-08 DIAGNOSIS — M25511 Pain in right shoulder: Secondary | ICD-10-CM | POA: Diagnosis not present

## 2016-07-08 DIAGNOSIS — M5416 Radiculopathy, lumbar region: Secondary | ICD-10-CM | POA: Diagnosis not present

## 2016-07-08 DIAGNOSIS — M545 Low back pain: Secondary | ICD-10-CM | POA: Diagnosis not present

## 2016-07-08 DIAGNOSIS — G894 Chronic pain syndrome: Secondary | ICD-10-CM | POA: Diagnosis not present

## 2016-07-08 DIAGNOSIS — M48061 Spinal stenosis, lumbar region without neurogenic claudication: Secondary | ICD-10-CM | POA: Diagnosis not present

## 2016-07-21 DIAGNOSIS — M25511 Pain in right shoulder: Secondary | ICD-10-CM | POA: Diagnosis not present

## 2016-07-21 DIAGNOSIS — M25512 Pain in left shoulder: Secondary | ICD-10-CM | POA: Diagnosis not present

## 2016-08-12 DIAGNOSIS — Z7984 Long term (current) use of oral hypoglycemic drugs: Secondary | ICD-10-CM | POA: Diagnosis not present

## 2016-08-12 DIAGNOSIS — E119 Type 2 diabetes mellitus without complications: Secondary | ICD-10-CM | POA: Diagnosis not present

## 2016-08-12 DIAGNOSIS — E039 Hypothyroidism, unspecified: Secondary | ICD-10-CM | POA: Diagnosis not present

## 2016-08-12 DIAGNOSIS — I10 Essential (primary) hypertension: Secondary | ICD-10-CM | POA: Diagnosis not present

## 2016-08-12 DIAGNOSIS — E669 Obesity, unspecified: Secondary | ICD-10-CM | POA: Diagnosis not present

## 2016-08-12 DIAGNOSIS — Z0000 Encounter for general adult medical examination without abnormal findings: Secondary | ICD-10-CM | POA: Diagnosis not present

## 2016-08-12 DIAGNOSIS — K007 Teething syndrome: Secondary | ICD-10-CM | POA: Diagnosis not present

## 2016-08-12 DIAGNOSIS — M75110 Incomplete rotator cuff tear or rupture of unspecified shoulder, not specified as traumatic: Secondary | ICD-10-CM | POA: Diagnosis not present

## 2016-08-12 DIAGNOSIS — Z6831 Body mass index (BMI) 31.0-31.9, adult: Secondary | ICD-10-CM | POA: Diagnosis not present

## 2016-08-19 IMAGING — CR DG CHEST 2V
1 series · 3 of 3 positions shown · non-contrast
Comparison: 10/10/2014 and prior chest radiographs

CLINICAL DATA: Acute shortness of breath for 1 day.

EXAM:
CHEST  2 VIEW

[Series 1: dg chest 2 view · 0.14mm/px · 3 of 3 slices shown]
[im 1/3]
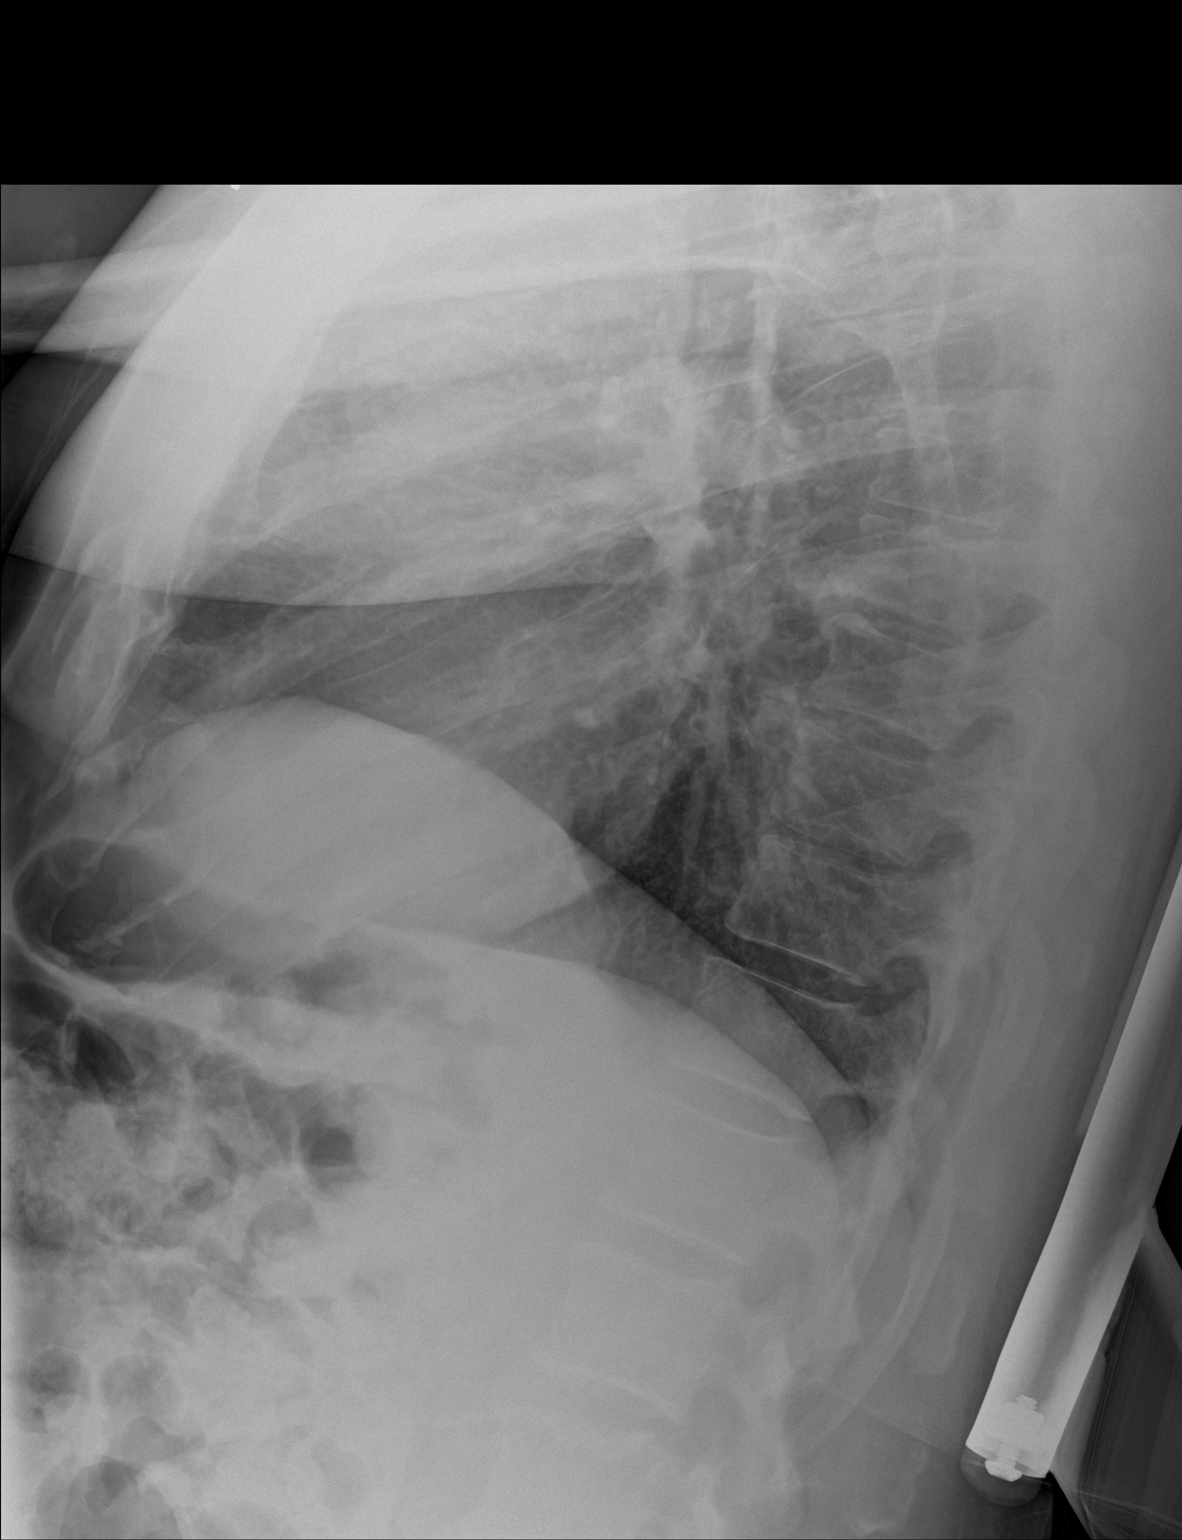
[im 2/3]
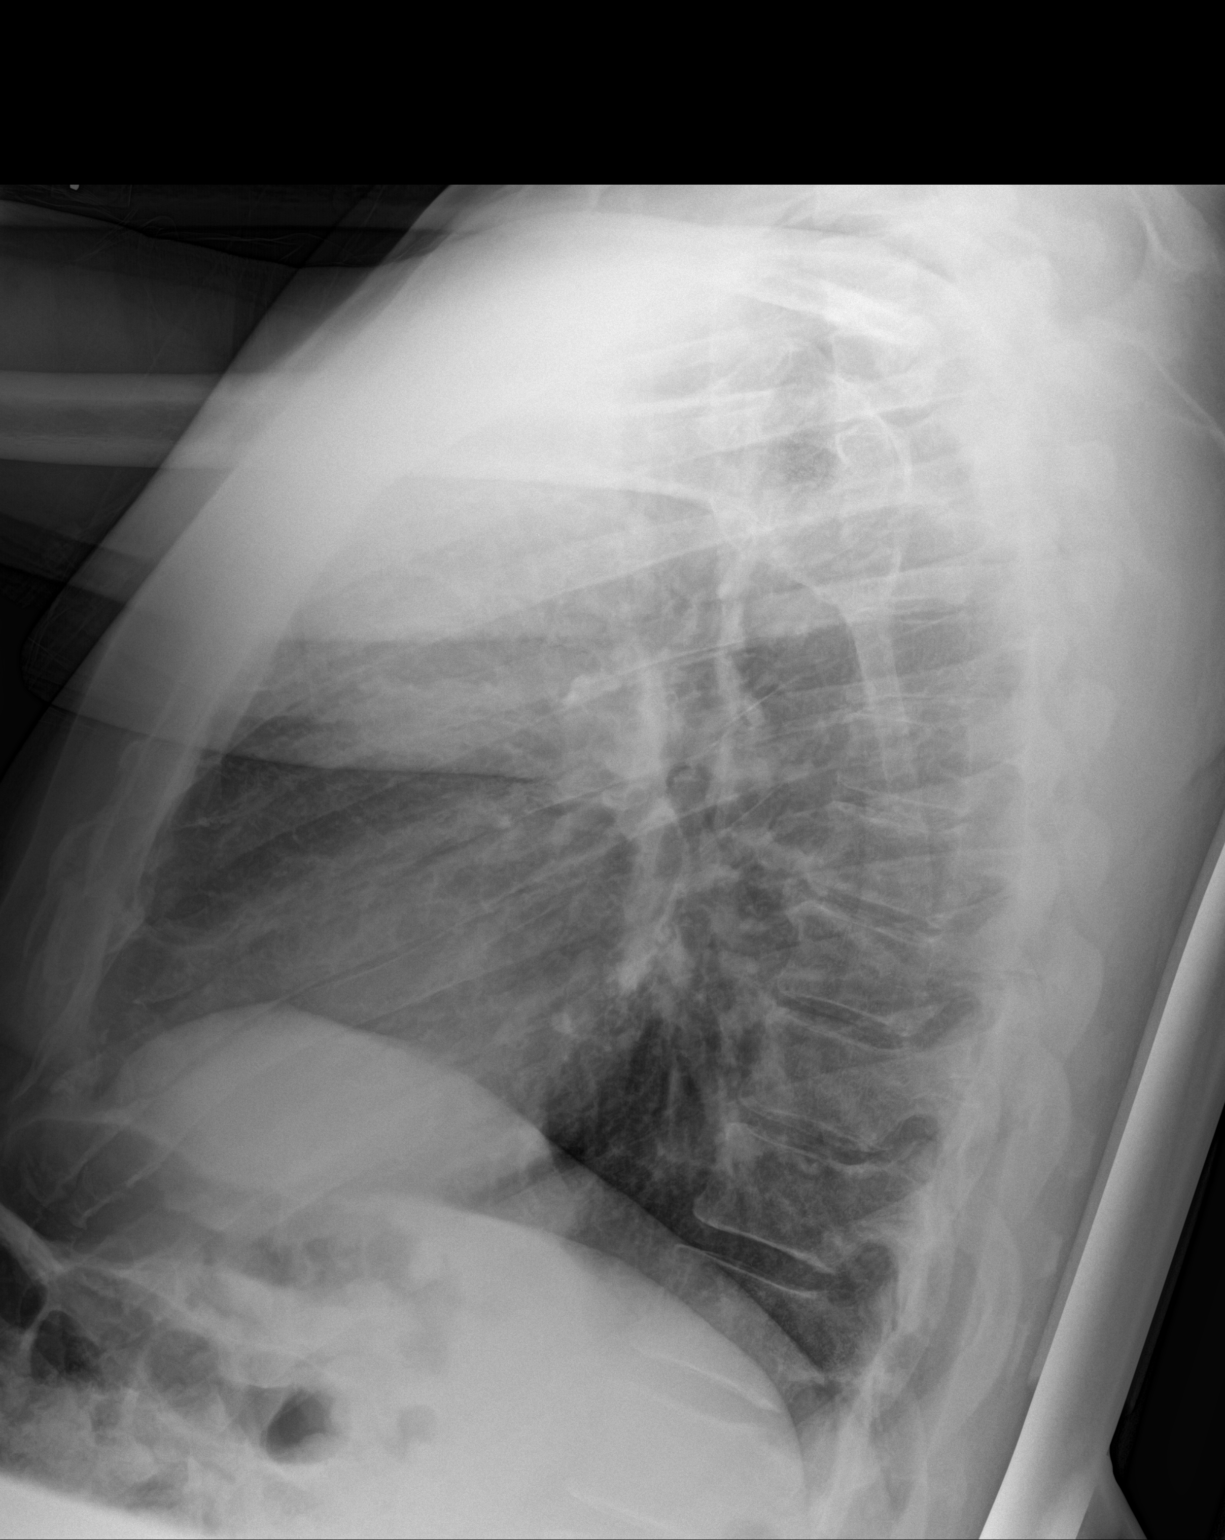
[im 3/3]
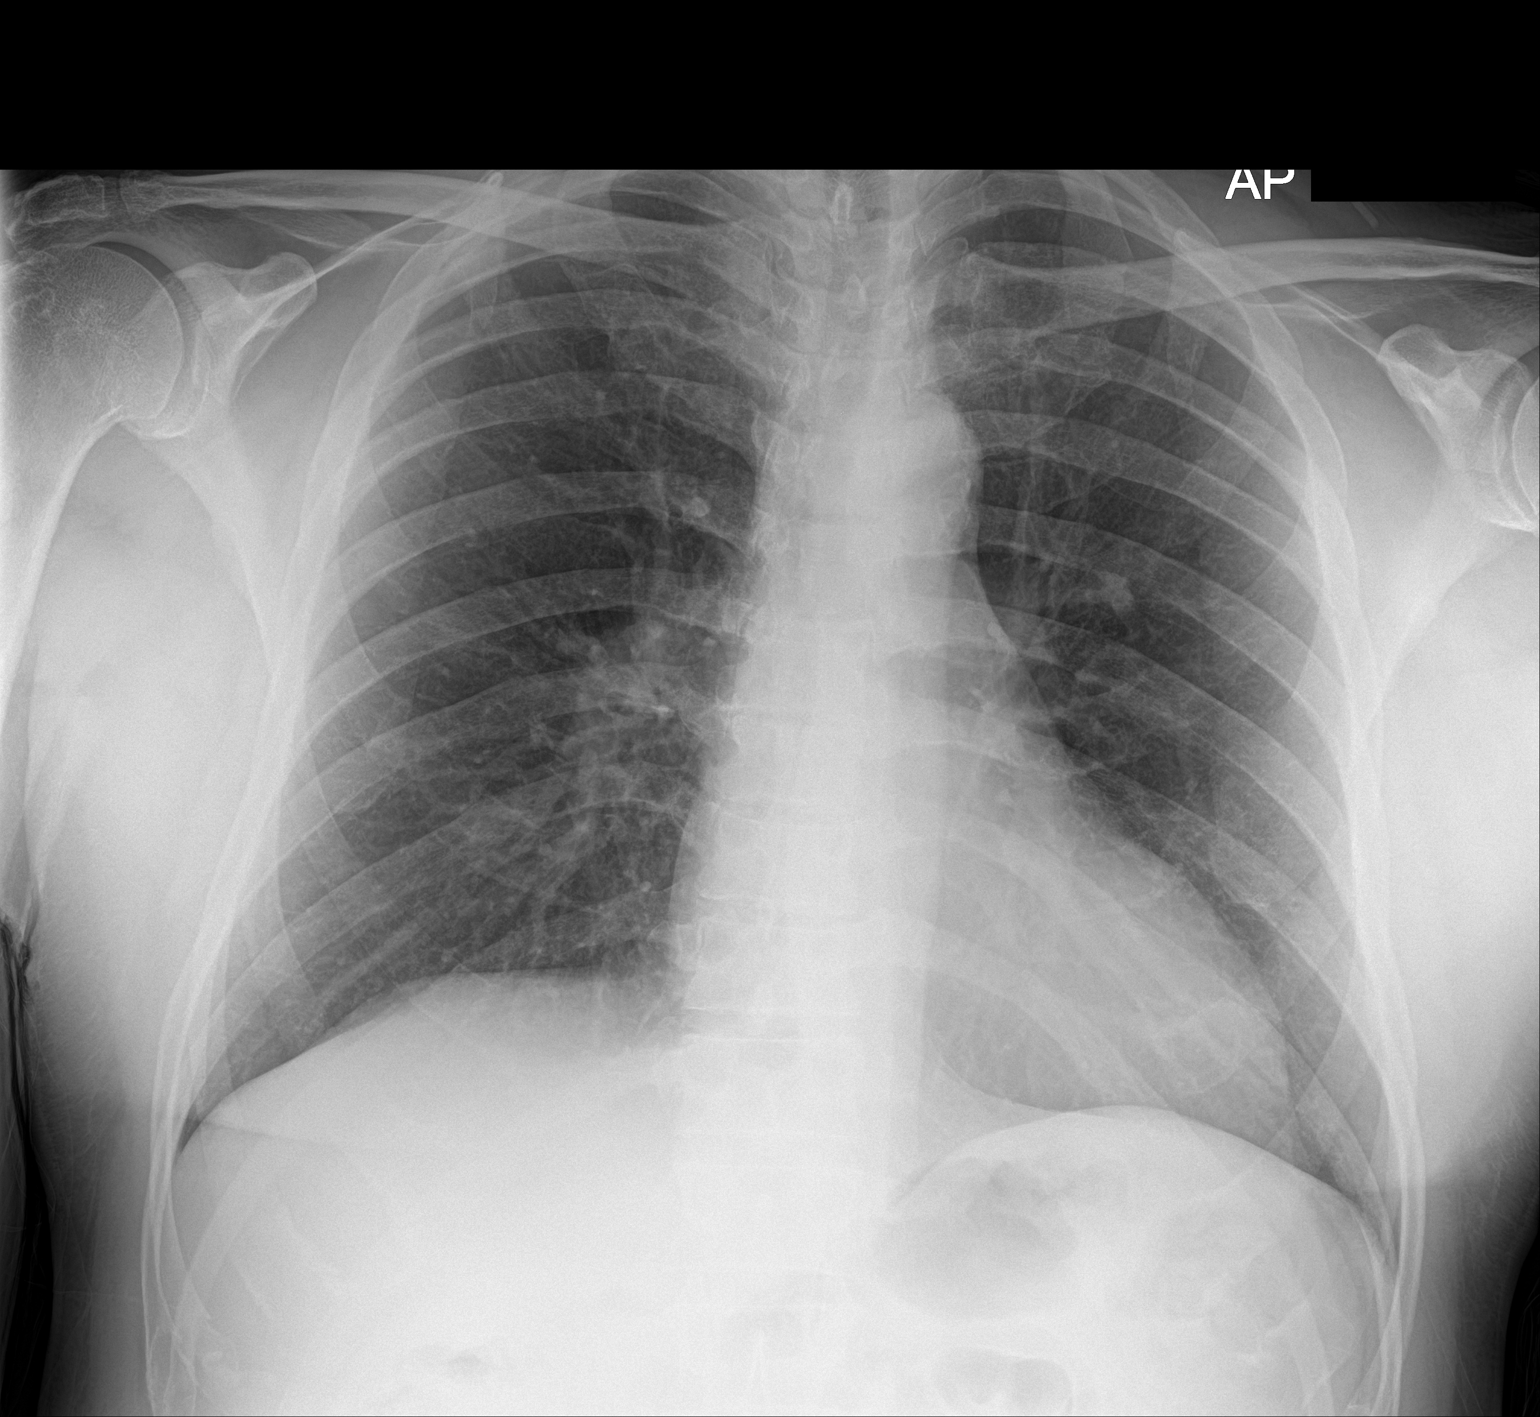

[3 of 3 positions shown; findings below may reference images not displayed]

FINDINGS: The cardiomediastinal silhouette is unremarkable.

There is no evidence of focal airspace disease, pulmonary edema,
suspicious pulmonary nodule/mass, pleural effusion, or pneumothorax.
No acute bony abnormalities are identified.
IMPRESSION: No active cardiopulmonary disease.

## 2016-10-27 DIAGNOSIS — M9901 Segmental and somatic dysfunction of cervical region: Secondary | ICD-10-CM | POA: Diagnosis not present

## 2016-10-27 DIAGNOSIS — M5414 Radiculopathy, thoracic region: Secondary | ICD-10-CM | POA: Diagnosis not present

## 2016-10-27 DIAGNOSIS — M5033 Other cervical disc degeneration, cervicothoracic region: Secondary | ICD-10-CM | POA: Diagnosis not present

## 2016-10-27 DIAGNOSIS — M9902 Segmental and somatic dysfunction of thoracic region: Secondary | ICD-10-CM | POA: Diagnosis not present

## 2016-10-28 DIAGNOSIS — M9902 Segmental and somatic dysfunction of thoracic region: Secondary | ICD-10-CM | POA: Diagnosis not present

## 2016-10-28 DIAGNOSIS — M5414 Radiculopathy, thoracic region: Secondary | ICD-10-CM | POA: Diagnosis not present

## 2016-10-28 DIAGNOSIS — M9901 Segmental and somatic dysfunction of cervical region: Secondary | ICD-10-CM | POA: Diagnosis not present

## 2016-10-28 DIAGNOSIS — M5033 Other cervical disc degeneration, cervicothoracic region: Secondary | ICD-10-CM | POA: Diagnosis not present

## 2016-11-01 DIAGNOSIS — M5414 Radiculopathy, thoracic region: Secondary | ICD-10-CM | POA: Diagnosis not present

## 2016-11-01 DIAGNOSIS — M5033 Other cervical disc degeneration, cervicothoracic region: Secondary | ICD-10-CM | POA: Diagnosis not present

## 2016-11-01 DIAGNOSIS — M9901 Segmental and somatic dysfunction of cervical region: Secondary | ICD-10-CM | POA: Diagnosis not present

## 2016-11-01 DIAGNOSIS — M9902 Segmental and somatic dysfunction of thoracic region: Secondary | ICD-10-CM | POA: Diagnosis not present

## 2016-11-03 DIAGNOSIS — M9902 Segmental and somatic dysfunction of thoracic region: Secondary | ICD-10-CM | POA: Diagnosis not present

## 2016-11-03 DIAGNOSIS — M5033 Other cervical disc degeneration, cervicothoracic region: Secondary | ICD-10-CM | POA: Diagnosis not present

## 2016-11-03 DIAGNOSIS — M5414 Radiculopathy, thoracic region: Secondary | ICD-10-CM | POA: Diagnosis not present

## 2016-11-03 DIAGNOSIS — M9901 Segmental and somatic dysfunction of cervical region: Secondary | ICD-10-CM | POA: Diagnosis not present

## 2016-11-10 DIAGNOSIS — M9902 Segmental and somatic dysfunction of thoracic region: Secondary | ICD-10-CM | POA: Diagnosis not present

## 2016-11-10 DIAGNOSIS — M9901 Segmental and somatic dysfunction of cervical region: Secondary | ICD-10-CM | POA: Diagnosis not present

## 2016-11-10 DIAGNOSIS — M5414 Radiculopathy, thoracic region: Secondary | ICD-10-CM | POA: Diagnosis not present

## 2016-11-10 DIAGNOSIS — M5033 Other cervical disc degeneration, cervicothoracic region: Secondary | ICD-10-CM | POA: Diagnosis not present

## 2016-11-12 DIAGNOSIS — M9901 Segmental and somatic dysfunction of cervical region: Secondary | ICD-10-CM | POA: Diagnosis not present

## 2016-11-12 DIAGNOSIS — M9902 Segmental and somatic dysfunction of thoracic region: Secondary | ICD-10-CM | POA: Diagnosis not present

## 2016-11-12 DIAGNOSIS — M5414 Radiculopathy, thoracic region: Secondary | ICD-10-CM | POA: Diagnosis not present

## 2016-11-12 DIAGNOSIS — M5033 Other cervical disc degeneration, cervicothoracic region: Secondary | ICD-10-CM | POA: Diagnosis not present

## 2016-11-12 NOTE — Progress Notes (Addendum)
11/15/2016 9:25 AM   Paul Briggs. [birth year] [id number]  Referring provider: Cletis Athens, MD [address] [place], [place] [address]  Chief Complaint  Patient presents with  . Benign Prostatic Hypertrophy    6 month follow up  . Erectile Dysfunction  . Hypogonadism    HPI: Patient is a 71 year old African American male with erectile dysfunction and BPH with LU TS who presents today for a follow up visit.   Erectile dysfunction His SHIM score is 20, which is mild ED.   He has been having difficulty with erections for several years.   His previous SHIM score is 7.  His major complaint is achieving an erection.  His libido is diminished.   His risk factors for ED are age, BPH, DM, HTN, HLD and blood pressure medications.   He denies any painful erections or curvatures with his erections.   He has tried PDE5i in the past.    He has been prescribed Cipro by his PCP for symptoms of burning after ejaculation.  He is convinced that the Cipro helps his erections.   He states after he gets an erection, he has pain that radiates through his perineal and the right side of his buttocks.        SHIM    Row Name 11/15/16 0907         SHIM: Over the last 6 months:   How do you rate your confidence that you could get and keep an erection? Moderate     When you had erections with sexual stimulation, how often were your erections hard enough for penetration (entering your partner)? Most Times (much more than half the time)     During sexual intercourse, how often were you able to maintain your erection after you had penetrated (entered) your partner? Almost Always or Always     During sexual intercourse, how difficult was it to maintain your erection to completion of intercourse? Slightly Difficult     When you attempted sexual intercourse, how often was it satisfactory for you? Most Times (much more than half the time)       SHIM Total Score   SHIM 20        Score: 1-7  Severe ED 8-11 Moderate ED 12-16 Mild-Moderate ED 17-21 Mild ED 22-25 No ED    BPH WITH LUTS His IPSS score today is 1, which is mild lower urinary tract symptomatology.  He is delighted with his quality life due to his urinary symptoms.   His previous IPSS score was 17/3.  His previous PVR is 0 mL.    His major complaints today are nocturia x 1.  He will not take the tamsulosin and finasteride due to sexual side effects.  He has had these symptoms for several years.  He denies any dysuria, hematuria or suprapubic pain.   He also denies any recent fevers, chills, nausea or vomiting.  He does not have a family history of PCa.      IPSS    Row Name 11/15/16 0900         International Prostate Symptom Score   How often have you had the sensation of not emptying your bladder? Not at All     How often have you had to urinate less than every two hours? Not at All     How often have you found you stopped and started again several times when you urinated? Not at All  How often have you found it difficult to postpone urination? Not at All     How often have you had a weak urinary stream? Not at All     How often have you had to strain to start urination? Not at All     How many times did you typically get up at night to urinate? 1 Time     Total IPSS Score 1       Quality of Life due to urinary symptoms   If you were to spend the rest of your life with your urinary condition just the way it is now how would you feel about that? Delighted        Score:  1-7 Mild 8-19 Moderate 20-35 Severe    PMH: Past Medical History:  Diagnosis Date  . Acute prostatitis   . BPH (benign prostatic hyperplasia)   . Chronic kidney disease   . Dysplasia of prostate   . Erectile dysfunction   . HTN (hypertension)   . Hypogonadism in male   . Orchitis and epididymitis   . Over weight   . Palindromic rheumatism, hand   . Prostatitis   . Rectum pain   . Testicle swelling   . Testicle  tenderness   . Testicular mass     Surgical History: Past Surgical History:  Procedure Laterality Date  . BACK SURGERY    . head surgery     from fall  drilled hole in brain to releive pressure    Home Medications:  Allergies as of 11/15/2016      Reactions   Enalapril Maleate Other (See Comments)      Medication List       Accurate as of 11/15/16  9:25 AM. Always use your most recent med list.          acetaminophen 650 MG suppository Commonly known as:  TYLENOL Place 1 suppository (650 mg total) rectally every 6 (six) hours as needed for mild pain (or Fever >/= 101).   amLODipine 10 MG tablet Commonly known as:  NORVASC Take by mouth.   aspirin 325 MG tablet Take 325 mg by mouth daily.   carvedilol 25 MG tablet Commonly known as:  COREG Take 25 mg by mouth.   cyclobenzaprine 10 MG tablet Commonly known as:  FLEXERIL Take 10 mg by mouth.   finasteride 5 MG tablet Commonly known as:  PROSCAR Take 1 tablet (5 mg total) by mouth daily.   gabapentin 300 MG capsule Commonly known as:  NEURONTIN Take 1 capsule by mouth at bedtime.   LEVOTHYROXINE SODIUM PO Take by mouth.   lisinopril-hydrochlorothiazide 20-12.5 MG tablet Commonly known as:  PRINZIDE,ZESTORETIC Take 1 tablet by mouth daily.   metFORMIN 1000 MG tablet Commonly known as:  GLUCOPHAGE Take 1,000 mg by mouth.   methocarbamol 750 MG tablet Commonly known as:  ROBAXIN Take 750 mg by mouth every 8 (eight) hours as needed. Reported on 06/02/2015   metoprolol succinate 100 MG 24 hr tablet Commonly known as:  TOPROL-XL Take 100 mg by mouth daily. Take with or immediately following a meal.   naproxen 500 MG tablet Commonly known as:  NAPROSYN Take 1 tablet (500 mg total) by mouth 2 (two) times daily with a meal.   oxyCODONE-acetaminophen 5-325 MG tablet Commonly known as:  PERCOCET/ROXICET Take by mouth every 4 (four) hours as needed for severe pain.   rosuvastatin 10 MG tablet Commonly  known as:  CRESTOR 1 tablet daily.  sildenafil 100 MG tablet Commonly known as:  VIAGRA Take 100 mg by mouth daily as needed for erectile dysfunction.   spironolactone 100 MG tablet Commonly known as:  ALDACTONE Take 100 mg by mouth daily.   tamsulosin 0.4 MG Caps capsule Commonly known as:  FLOMAX Take 1 capsule (0.4 mg total) by mouth daily. Reported on 06/02/2015   traMADol 50 MG tablet Commonly known as:  ULTRAM Take 50 mg by mouth daily. Reported on 06/02/2015   vardenafil 20 MG tablet Commonly known as:  LEVITRA Take 20 mg by mouth daily as needed for erectile dysfunction. Reported on 06/02/2015       Allergies:  Allergies  Allergen Reactions  . Enalapril Maleate Other (See Comments)    Family History: Family History  Problem Relation Age of Onset  . Cancer Maternal Aunt   . Prostate cancer Neg Hx   . Kidney disease Neg Hx   . Kidney cancer Neg Hx   . Bladder Cancer Neg Hx     Social History:  reports that he has quit smoking. He has never used smokeless tobacco. He reports that he does not drink alcohol or use drugs.  ROS: UROLOGY Frequent Urination?: No Hard to postpone urination?: No Burning/pain with urination?: No Get up at night to urinate?: No Leakage of urine?: No Urine stream starts and stops?: No Trouble starting stream?: No Do you have to strain to urinate?: No Blood in urine?: No Urinary tract infection?: No Sexually transmitted disease?: No Injury to kidneys or bladder?: No Painful intercourse?: No Weak stream?: No Erection problems?: No Penile pain?: No  Gastrointestinal Nausea?: No Vomiting?: No Indigestion/heartburn?: No Diarrhea?: No Constipation?: No  Constitutional Fever: No Night sweats?: No Weight loss?: No Fatigue?: No  Skin Skin rash/lesions?: No Itching?: No  Eyes Blurred vision?: Yes Double vision?: No  Ears/Nose/Throat Sore throat?: No Sinus problems?: No  Hematologic/Lymphatic Swollen glands?:  No Easy bruising?: No  Cardiovascular Leg swelling?: No Chest pain?: No  Respiratory Cough?: No Shortness of breath?: No  Endocrine Excessive thirst?: No  Musculoskeletal Back pain?: No Joint pain?: Yes  Neurological Headaches?: No Dizziness?: No  Psychologic Depression?: No Anxiety?: No  Physical Exam: BP (!) 153/93   Pulse (!) 59   Ht 6' (1.829 m)   Wt 228 lb 1.6 oz (103.5 kg)   BMI 30.94 kg/m   Constitutional: Well nourished. Alert and oriented, No acute distress. HEENT: [place] AT, moist mucus membranes. Trachea midline, no masses. Cardiovascular: No clubbing, cyanosis, or edema. Respiratory: Normal respiratory effort, no increased work of breathing. GI: Abdomen is soft, non tender, non distended, no abdominal masses. Liver and spleen not palpable.  No hernias appreciated.  Stool sample for occult testing is not indicated.   GU: No CVA tenderness.  No bladder fullness or masses.  Patient with circumcised phallus.   Urethral meatus is patent.  No penile discharge. No penile lesions or rashes. Scrotum without lesions, cysts, rashes and/or edema.  Testicles are located scrotally bilaterally. No masses are appreciated in the testicles. Left and right epididymis are normal. Rectal: Patient with  normal sphincter tone. Anus and perineum without scarring or rashes. No rectal masses are appreciated. Prostate is approximately 50 grams, no nodules are appreciated. Seminal vesicles are normal. Skin: No rashes, bruises or suspicious lesions. Lymph: No cervical or inguinal adenopathy. Neurologic: Grossly intact, no focal deficits, moving all 4 extremities. Psychiatric: Normal mood and affect.  Laboratory Data: PSA 0.1 ng/mL on 02/27/2016  Lab Results  Component Value  Date   TESTOSTERONE 480 03/08/2016   I have reviewed the labs.  Assessment & Plan:    1. BPH with LUTS  - IPSS score is 1/0, it is improved  - Continue conservative management, avoiding bladder irritants and  timed voiding's  - I did advise the patient is not to take Cipro unless he actually has an infection - symptoms being blood in the urine, fever, chills and/or burning with urination - advised him of the side effects of Cipro such as mental confusion, tendon rupture, permanent nerve damage, heart arrhythmias and C. difficile.  - RTC in 12 months for IPSS, PSA and exam  2. Erectile dysfunction:     -SHIM score is 20  -continue Viagra  -RTC in 12 months for SHIM score and exam  3. Pelvic floor dysfunction  - explained to the patient that the "pelvic floor muscles" are a group of muscles that are arranged within the pelvis like a sling or hammock, connecting the front, back, and sides of the pelvis and sacrum. The main function of these muscles is to provide support to the organs of the pelvis, including the bladder, uterus or prostate, and rectum. They also make up part of the urethra and rectum.  These muscles must be able to effectively coordinate contraction and relaxation to allow normal functioning of the bowel and bladder. Moreover, the ability of these muscles to relax is essential to allow for normal urination, bowel movements, and sexual intercourse.  - explained that "Pelvic Floor Dysfunction," or PFD, refers to these muscles when they are too relaxed or when they have too much tension. Abnormal muscle tone can affect urinary and bowel functions, sexual function, and can cause pain.  - recommended PT for further evaluation and treatment     Return in about 3 months (around 02/15/2017) for IPSS, SHIM and exam.  These notes generated with voice recognition software. I apologize for typographical errors.  Zara Council, [facility] [address] [place], [place] [address] [phone]

## 2016-11-15 ENCOUNTER — Ambulatory Visit (INDEPENDENT_AMBULATORY_CARE_PROVIDER_SITE_OTHER): Payer: Medicare HMO | Admitting: Urology

## 2016-11-15 ENCOUNTER — Encounter: Payer: Self-pay | Admitting: Urology

## 2016-11-15 VITALS — BP 153/93 | HR 59 | Ht 72.0 in | Wt 228.1 lb

## 2016-11-15 DIAGNOSIS — N138 Other obstructive and reflux uropathy: Secondary | ICD-10-CM

## 2016-11-15 DIAGNOSIS — N529 Male erectile dysfunction, unspecified: Secondary | ICD-10-CM

## 2016-11-15 DIAGNOSIS — M6289 Other specified disorders of muscle: Secondary | ICD-10-CM | POA: Diagnosis not present

## 2016-11-15 DIAGNOSIS — M9902 Segmental and somatic dysfunction of thoracic region: Secondary | ICD-10-CM | POA: Diagnosis not present

## 2016-11-15 DIAGNOSIS — M5033 Other cervical disc degeneration, cervicothoracic region: Secondary | ICD-10-CM | POA: Diagnosis not present

## 2016-11-15 DIAGNOSIS — N401 Enlarged prostate with lower urinary tract symptoms: Secondary | ICD-10-CM

## 2016-11-15 DIAGNOSIS — M9901 Segmental and somatic dysfunction of cervical region: Secondary | ICD-10-CM | POA: Diagnosis not present

## 2016-11-15 DIAGNOSIS — M5414 Radiculopathy, thoracic region: Secondary | ICD-10-CM | POA: Diagnosis not present

## 2016-11-18 DIAGNOSIS — M9902 Segmental and somatic dysfunction of thoracic region: Secondary | ICD-10-CM | POA: Diagnosis not present

## 2016-11-18 DIAGNOSIS — M5414 Radiculopathy, thoracic region: Secondary | ICD-10-CM | POA: Diagnosis not present

## 2016-11-18 DIAGNOSIS — M9901 Segmental and somatic dysfunction of cervical region: Secondary | ICD-10-CM | POA: Diagnosis not present

## 2016-11-18 DIAGNOSIS — M5033 Other cervical disc degeneration, cervicothoracic region: Secondary | ICD-10-CM | POA: Diagnosis not present

## 2016-11-22 DIAGNOSIS — M5414 Radiculopathy, thoracic region: Secondary | ICD-10-CM | POA: Diagnosis not present

## 2016-11-22 DIAGNOSIS — M9902 Segmental and somatic dysfunction of thoracic region: Secondary | ICD-10-CM | POA: Diagnosis not present

## 2016-11-22 DIAGNOSIS — M9901 Segmental and somatic dysfunction of cervical region: Secondary | ICD-10-CM | POA: Diagnosis not present

## 2016-11-22 DIAGNOSIS — M5033 Other cervical disc degeneration, cervicothoracic region: Secondary | ICD-10-CM | POA: Diagnosis not present

## 2016-11-25 DIAGNOSIS — M5033 Other cervical disc degeneration, cervicothoracic region: Secondary | ICD-10-CM | POA: Diagnosis not present

## 2016-11-25 DIAGNOSIS — M9901 Segmental and somatic dysfunction of cervical region: Secondary | ICD-10-CM | POA: Diagnosis not present

## 2016-11-25 DIAGNOSIS — M5414 Radiculopathy, thoracic region: Secondary | ICD-10-CM | POA: Diagnosis not present

## 2016-11-25 DIAGNOSIS — M9902 Segmental and somatic dysfunction of thoracic region: Secondary | ICD-10-CM | POA: Diagnosis not present

## 2016-11-29 DIAGNOSIS — M5033 Other cervical disc degeneration, cervicothoracic region: Secondary | ICD-10-CM | POA: Diagnosis not present

## 2016-11-29 DIAGNOSIS — M9902 Segmental and somatic dysfunction of thoracic region: Secondary | ICD-10-CM | POA: Diagnosis not present

## 2016-11-29 DIAGNOSIS — M5414 Radiculopathy, thoracic region: Secondary | ICD-10-CM | POA: Diagnosis not present

## 2016-11-29 DIAGNOSIS — M9901 Segmental and somatic dysfunction of cervical region: Secondary | ICD-10-CM | POA: Diagnosis not present

## 2016-12-02 DIAGNOSIS — M9902 Segmental and somatic dysfunction of thoracic region: Secondary | ICD-10-CM | POA: Diagnosis not present

## 2016-12-02 DIAGNOSIS — M9901 Segmental and somatic dysfunction of cervical region: Secondary | ICD-10-CM | POA: Diagnosis not present

## 2016-12-02 DIAGNOSIS — M5033 Other cervical disc degeneration, cervicothoracic region: Secondary | ICD-10-CM | POA: Diagnosis not present

## 2016-12-02 DIAGNOSIS — M5414 Radiculopathy, thoracic region: Secondary | ICD-10-CM | POA: Diagnosis not present

## 2016-12-06 DIAGNOSIS — M9901 Segmental and somatic dysfunction of cervical region: Secondary | ICD-10-CM | POA: Diagnosis not present

## 2016-12-06 DIAGNOSIS — M9902 Segmental and somatic dysfunction of thoracic region: Secondary | ICD-10-CM | POA: Diagnosis not present

## 2016-12-06 DIAGNOSIS — M5414 Radiculopathy, thoracic region: Secondary | ICD-10-CM | POA: Diagnosis not present

## 2016-12-06 DIAGNOSIS — M5033 Other cervical disc degeneration, cervicothoracic region: Secondary | ICD-10-CM | POA: Diagnosis not present

## 2016-12-21 DIAGNOSIS — M5033 Other cervical disc degeneration, cervicothoracic region: Secondary | ICD-10-CM | POA: Diagnosis not present

## 2016-12-21 DIAGNOSIS — M9902 Segmental and somatic dysfunction of thoracic region: Secondary | ICD-10-CM | POA: Diagnosis not present

## 2016-12-21 DIAGNOSIS — M9901 Segmental and somatic dysfunction of cervical region: Secondary | ICD-10-CM | POA: Diagnosis not present

## 2016-12-21 DIAGNOSIS — M5414 Radiculopathy, thoracic region: Secondary | ICD-10-CM | POA: Diagnosis not present

## 2016-12-31 ENCOUNTER — Encounter: Payer: Self-pay | Admitting: Physical Therapy

## 2016-12-31 ENCOUNTER — Other Ambulatory Visit: Payer: Self-pay

## 2016-12-31 ENCOUNTER — Ambulatory Visit: Payer: Medicare HMO | Attending: Urology | Admitting: Physical Therapy

## 2016-12-31 VITALS — BP 160/80

## 2016-12-31 DIAGNOSIS — M533 Sacrococcygeal disorders, not elsewhere classified: Secondary | ICD-10-CM | POA: Diagnosis not present

## 2016-12-31 DIAGNOSIS — M25511 Pain in right shoulder: Secondary | ICD-10-CM | POA: Insufficient documentation

## 2016-12-31 DIAGNOSIS — R2689 Other abnormalities of gait and mobility: Secondary | ICD-10-CM | POA: Diagnosis present

## 2016-12-31 DIAGNOSIS — M25512 Pain in left shoulder: Secondary | ICD-10-CM | POA: Insufficient documentation

## 2016-12-31 DIAGNOSIS — R278 Other lack of coordination: Secondary | ICD-10-CM | POA: Diagnosis present

## 2016-12-31 DIAGNOSIS — G8929 Other chronic pain: Secondary | ICD-10-CM | POA: Diagnosis present

## 2016-12-31 NOTE — Therapy (Signed)
[place] MAIN [facility] SERVICES [address] [place], [place], [address] Phone: [phone]   Fax:  [phone]  Physical Therapy Evaluation  Patient Details  Name: Paul Hoogland [name] MRN: [id number] Date of Birth: 02-12-1945 Referring Provider: Zara Council   Encounter Date: 12/31/2016  PT End of Session - 12/31/16 1359    Visit Number  1    Number of Visits  12    Date for PT Re-Evaluation  03/25/17    Authorization Type  g code    PT Start Time  0914    PT Stop Time  1010    PT Time Calculation (min)  56 min       Past Medical History:  Diagnosis Date  . Acute prostatitis   . BPH (benign prostatic hyperplasia)   . Chronic kidney disease   . Dysplasia of prostate   . Erectile dysfunction   . HTN (hypertension)   . Hypogonadism in male   . Orchitis and epididymitis   . Over weight   . Palindromic rheumatism, hand   . Prostatitis   . Rectum pain   . Testicle swelling   . Testicle tenderness   . Testicular mass     Past Surgical History:  Procedure Laterality Date  . BACK SURGERY    . head surgery     from fall  drilled hole in brain to relieve pressure    Vitals:   12/31/16 0945  BP: (!) 160/80     Subjective Assessment - 12/31/16 0921    Subjective   1)Pt reports pelvic pain with erections, mostly on the R side that radiates to the buttocks. Pain eases between hours to days with all movements. 8/10.  This pain has been on and off for 4-5 years.  Denied pain with urination, bowel movements.     2)  B hip pain and low back pain with strenuous work : lifting , bending, and up and down from the floor. 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 Works10-12 hoursvarying 5-6 days a week. Pt had back surgery from an injury in 2017.  When pt does a lot of movements, he notices a  muscle on the R low back and buttocks that gets strained. His hips hurt with up and down movements required at work.  Hx of arthritis.     Patient is accompained by:  Family member wife    Pertinent History  HTN, taking medication.          [facility] PT Assessment - 12/31/16 0918      Assessment   Medical Diagnosis  pelvic floor dysfunction    Referring Provider  Zara Council      Precautions   Precautions  None      Restrictions   Weight Bearing Restrictions  No      Balance Screen   Has the patient fallen in the past 6 months  No      Floor to Stand   Comments  breathholding, poor alignment, excessive strain on knees       AROM   Overall AROM Comments  spine: WFL       PROM   Overall PROM Comments  R hip IR ~4deg, L ~10 deg. Post Tx R: ~10 deg       Palpation   SI assessment  Standing: L PSIS more posterior . ( post Tx: more aligned) . limited sacral nutation      Palpation comment  Pain at R pubic rami.  increased scar restrictions at lumbar spine        Bed Mobility   Bed Mobility  -- breathholding, half crunch              Objective measurements completed on examination: See above findings.    Pelvic Floor Special Questions - 12/31/16 1402    External Perineal Exam  through clothing: no tenderness at coccyeus, no tensions, no deviations to coccyx        [facility] Adult PT Treatment/Exercise - 12/31/16 1353      Exercises   Exercises  -- see pt instructions       Manual Therapy   Manual therapy comments  long axis distraction RLE, AP mobs Rhip FADDIR, inferior/superior mob on sacrum in sidelying              PT Education - 12/31/16 1359    Education provided  Yes    Education Details  POC, anatomy, physiology, goals, HEP     Person(s) Educated  Patient    Methods  Explanation;Demonstration;Tactile cues;Verbal cues;Handout    Comprehension  Returned demonstration;Verbalized understanding          PT Long Term Goals - 12/31/16 1341       PT LONG TERM GOAL #1   Title  Pt will decrease his score on NIH-CPSI from 42% to < 37% in order to improve QOL    Time  12    Period  Weeks    Status  New    Target Date  03/25/17      PT LONG TERM GOAL #2   Title  Pt will demo no pelvic obliquties across 2 visits and no tenderness at R suprapubic area in order to return to ADLs    Baseline  L PSIS more posterior    Time  2    Period  Weeks    Status  New    Target Date  01/14/17      PT LONG TERM GOAL #3   Title  Pt will demo proper body mechanics in loaded activities ( lifting, stand<> floor transfers, bending) in order to minimize strain on pelvic floor mm and back to minimize injuries    Time  8    Period  Weeks    Status  New    Target Date  02/25/17      PT LONG TERM GOAL #4   Title  Pt will demo decreased scar restrictions over lumbar scar in order to minimize LBP with work activities    Time  10    Period  Weeks    Status  New    Target Date  03/11/17             Plan - 12/31/16 1400    Clinical Impression Statement  Pt is a 71 yo male who complains of pelvic pain with erections and also has chronic B hip and low back pain. These deficits impact his ADLs, QOL, and activities at work which involves bending, lifting, and getting up and down from the floor. Pt 's clinical presentations include pelvic obliquities, increased lumbar scar restrictions, tenderess to R suprapubic area, limited SIJ and hip mobility, and poor body mechanics which places strain on his back and pelvic floor mm.  Following Tx today, pt 's pelvic L posterior rotation was  corrected and his passive hip IR on R increased. Pt also showed improved technique with stand<> floor t/f to minimize strain on his pelvic floor mm and knees.       Clinical Presentation  Evolving    Clinical Decision Making  Low    Rehab Potential  Good    PT Frequency  1x / week    PT Duration  12 weeks    PT Treatment/Interventions  Neuromuscular re-education;Scar  mobilization;Patient/family education;Gait training;Stair training;Therapeutic activities;Functional mobility training;Therapeutic exercise;Manual techniques    Consulted and Agree with Plan of Care  Patient       Patient will benefit from skilled therapeutic intervention in order to improve the following deficits and impairments:  Decreased endurance, Abnormal gait, Decreased balance, Decreased mobility, Difficulty walking, Increased muscle spasms, Decreased range of motion, Decreased coordination, Postural dysfunction, Decreased safety awareness, Pain, Increased fascial restricitons, Decreased scar mobility, Hypomobility  Visit Diagnosis: Sacrococcygeal disorders, not elsewhere classified  Other lack of coordination  Other abnormalities of gait and mobility  G-Codes - 03-Jan-2017 1410    Functional Assessment Tool Used (Outpatient Only)  NIH-CPSI:  42% , clinical judgement     Functional Limitation  Mobility: Walking and moving around    Mobility: Walking and Moving Around Current Status [phone])  At least 40 percent but less than 60 percent impaired, limited or restricted    Mobility: Walking and Moving Around Goal Status [phone])  At least 1 percent but less than 20 percent impaired, limited or restricted        Problem List Patient Active Problem List   Diagnosis Date Noted  . Sepsis ([place]) 07/19/2015  . HTN (hypertension) 07/19/2015  . Diabetes mellitus ([place]) 07/19/2015  . Hypothyroidism 07/19/2015  . Peripheral neuropathy 07/19/2015  . Chronic back pain 07/19/2015  . CKD (chronic kidney disease) stage 3, GFR 30-59 ml/min ([facility]) 07/19/2015  . Enlarged prostate 03/25/2015  . Urine stream spraying 03/25/2015  . BPH with obstruction/lower urinary tract symptoms 09/18/2014  . Other male erectile dysfunction 09/18/2014    Jerl Mina ,PT, DPT, E-RYT  01/03/17, 2:11 PM  [place] MAIN [facility] SERVICES [address] [place], [place],  [address] Phone: [phone]   Fax:  [phone]  Name: Paul Nestor [name] MRN: [id number] Date of Birth: October 29, 1945

## 2016-12-31 NOTE — Patient Instructions (Signed)
   Standing:  10 reps on both sides x 3 x day     3 point tap   Feet are hip width Tap forward, center under hip not feet next to each other  Tap middle\, center  Tap back      __________   Getting up and down from floor    Mini squat position with feet wide, knees behind toes   Lower hands down to get on all fours, then tuck one knee under  Into half kneeling  Perform your ground work with front knee above ankle    , same way getting up,  Mini squat, and hands crawl up thigh to straighten up back

## 2017-01-06 ENCOUNTER — Ambulatory Visit: Payer: PPO | Admitting: Physical Therapy

## 2017-01-07 ENCOUNTER — Ambulatory Visit: Payer: Medicare HMO | Admitting: Physical Therapy

## 2017-01-13 ENCOUNTER — Encounter: Payer: PPO | Admitting: Physical Therapy

## 2017-01-14 ENCOUNTER — Other Ambulatory Visit: Payer: Self-pay | Admitting: Internal Medicine

## 2017-01-14 ENCOUNTER — Ambulatory Visit
Admission: RE | Admit: 2017-01-14 | Discharge: 2017-01-14 | Disposition: A | Discharge status: Home / Self Care | Payer: Medicare HMO | Source: Ambulatory Visit | Attending: Internal Medicine | Admitting: Internal Medicine

## 2017-01-14 ENCOUNTER — Ambulatory Visit: Payer: Medicare HMO | Admitting: Physical Therapy

## 2017-01-14 DIAGNOSIS — R278 Other lack of coordination: Secondary | ICD-10-CM

## 2017-01-14 DIAGNOSIS — R2689 Other abnormalities of gait and mobility: Secondary | ICD-10-CM

## 2017-01-14 DIAGNOSIS — M533 Sacrococcygeal disorders, not elsewhere classified: Secondary | ICD-10-CM

## 2017-01-14 DIAGNOSIS — I10 Essential (primary) hypertension: Secondary | ICD-10-CM | POA: Diagnosis not present

## 2017-01-14 DIAGNOSIS — M25511 Pain in right shoulder: Secondary | ICD-10-CM

## 2017-01-14 DIAGNOSIS — R5381 Other malaise: Secondary | ICD-10-CM | POA: Diagnosis not present

## 2017-01-14 DIAGNOSIS — E7849 Other hyperlipidemia: Secondary | ICD-10-CM | POA: Diagnosis not present

## 2017-01-14 DIAGNOSIS — M1611 Unilateral primary osteoarthritis, right hip: Secondary | ICD-10-CM | POA: Diagnosis not present

## 2017-01-14 DIAGNOSIS — M25551 Pain in right hip: Secondary | ICD-10-CM

## 2017-01-14 DIAGNOSIS — M25512 Pain in left shoulder: Secondary | ICD-10-CM

## 2017-01-14 DIAGNOSIS — C61 Malignant neoplasm of prostate: Secondary | ICD-10-CM | POA: Diagnosis not present

## 2017-01-14 DIAGNOSIS — E119 Type 2 diabetes mellitus without complications: Secondary | ICD-10-CM | POA: Diagnosis not present

## 2017-01-14 DIAGNOSIS — G8929 Other chronic pain: Secondary | ICD-10-CM

## 2017-01-15 NOTE — Therapy (Addendum)
[name] MAIN [facility] SERVICES [address] [place], [place], [address] Phone: [phone]   Fax:  [phone]  Physical Therapy Treatment  Patient Details  Name: Paul Varelas [name] MRN: [id number] Date of Birth: 1945/07/30 Referring Provider: Zara Council   Encounter Date: 01/14/2017  PT End of Session - 01/15/17 2303    Visit Number  2    Number of Visits  12    Date for PT Re-Evaluation  03/25/17    Authorization Type  g code    PT Start Time  1205    PT Stop Time  1310    PT Time Calculation (min)  65 min       Past Medical History:  Diagnosis Date  . Acute prostatitis   . BPH (benign prostatic hyperplasia)   . Chronic kidney disease   . Dysplasia of prostate   . Erectile dysfunction   . HTN (hypertension)   . Hypogonadism in male   . Orchitis and epididymitis   . Over weight   . Palindromic rheumatism, hand   . Prostatitis   . Rectum pain   . Testicle swelling   . Testicle tenderness   . Testicular mass     Past Surgical History:  Procedure Laterality Date  . BACK SURGERY    . head surgery     from fall  drilled hole in brain to relieve pressure    There were no vitals filed for this visit.  Subjective Assessment - 01/15/17 2307    Subjective Pt reports more pain in his L shoulder than R. Pt received a shot in his L shoulder today at his PCP. Pt reports he has to lift with other hand to support this elbow in order to raise his arm. 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 Pain does not radiate down arm.  This shoulder pain has been happening and has gradually worsened. Pt would like to also address this issue in addition to his pelvic and LBP/hip issues.  Pt is L-handed dominant   Patient is accompained by:  Family member wife    Pertinent History  HTN, taking medication.          [facility] PT Assessment - 01/15/17 2257      AROM   Overall AROM  Comments Shoulder flexion R:80, L 20 deg, Shoulder abduction R 55 deg, L 20 deg.     Functional reach behind neck: unable to lift without opp UE assisting elbow Functional reach behind back: first digit at L4 L ,       PROM   Overall PROM Comments  L shoulder abduction (pre Tx: 70 deg, post Tx 90 deg).       Strength   Overall Strength Comments shoulder ER  R 2/3, L 3+/5. (pain reported B)   pronation/ supination 5/5 B wrist ext/flexion grip 5/5 B       Palpation   Spinal mobility  thoracic T7-12 hypombility     Palpation comment  winching tenderness at L lower rhomboid/ medial scapular mm attachments (post Tx decreased )  Increased lumbar scar restriction preTx,  Decreased PostTx      Simulated task for job:    In quadriped position to smooth out concrete:  Protracted shoulders, overuse of glenohumeral joint L with decreased scapular stabilization   Floor<> stand with poor alignment of knees in half kneeling  [facility] Adult PT Treatment/Exercise - 01/15/17 2303      Exercises   Exercises  -- see pt instructions       Manual Therapy   Manual therapy comments  STM T7-10 at rhomboids/ medial scapula L , rotational mob along thoracic , lumbar scar releases               PT Education - 01/15/17 2303    Education provided  Yes    Education Details  HEP    Person(s) Educated  Patient    Methods  Explanation;Demonstration;Tactile cues;Verbal cues;Handout    Comprehension  Returned demonstration;Verbalized understanding          PT Long Term Goals - 01/15/17 2303      PT LONG TERM GOAL #1   Title  Pt will decrease his score on NIH-CPSI from 42% to < 37% in order to improve QOL    Time  12    Period  Weeks    Status  On-going      PT LONG TERM GOAL #2   Title  Pt will demo no pelvic obliquties across 2 visits and no tenderness at R suprapubic area in order to return to ADLs    Baseline  L PSIS more posterior    Time  2    Period  Weeks     Status  On-going      PT LONG TERM GOAL #3   Title  Pt will demo proper body mechanics in loaded activities ( lifting, stand<> floor transfers, bending) in order to minimize strain on pelvic floor mm and back to minimize injuries    Time  8    Period  Weeks    Status  On-going      PT LONG TERM GOAL #4   Title  Pt will demo decreased scar restrictions over lumbar scar in order to minimize LBP with work activities    Time  10    Period  Weeks    Status  On-going            Plan - 01/15/17 2303    Clinical Impression Statement Pt requested for his shoulder issue to be treated in addition to his pelvic issues. MD order has been received from his PCP to eval and treat his shoulder pain today which he reports to be a major issue due to the physical demands at his job.  Pt's assessment showed significantly limited B AROM/PROM in flexion/ abduction, overhead reaching B, increased mm tensions at region medial to scapula L > R.  Pt denied radiating pain down arms. Pt showed poor scapular stabilization during a simulated work task ( in [employer] concrete) and poor body mechanics with stand<> floor t/f.  Pt tolerated manual Tx without complaints which decreased mm tensions of rhomboids/ mid trap on L. Pt reported decreased tenderness in this area post Tx and showed slightly increased AROM/ PROM in abduction of L shoulder and increased scapular mobility for retraction and depression. Added rotator cuff and scapular stabilization strengthening exercises to HEP with which pt reported no pain.  Addressed lumbar scar restrictions with scar releases today to promote more posterior back mm pliability. Plan to address his pelvic related issues in upcoming session once pt shows more improvements with his shoulders.    Pt would benefit from being seen 2x week to promote improvements for his shoulder issues but current PT and other PTs on the team does not have availability on their  schedule  for his frequency. Plan to work with scheduling for pt to potentially work with another PT  whose schedule can fit him in twice a week. Pt continues to benefit from skilled PT.   Pt has shown confusion re: his schedule and will require simplified/ consistent time/ day in order to promote compliance.      Rehab Potential  Good    PT Frequency  2x / week    PT Duration  12 weeks    PT Treatment/Interventions  Neuromuscular re-education;Scar mobilization;Patient/family education;Gait training;Stair training;Therapeutic activities;Functional mobility training;Therapeutic exercise;Manual techniques    Consulted and Agree with Plan of Care  Patient       Patient will benefit from skilled therapeutic intervention in order to improve the following deficits and impairments:  Decreased endurance, Abnormal gait, Decreased balance, Decreased mobility, Difficulty walking, Increased muscle spasms, Decreased range of motion, Decreased coordination, Postural dysfunction, Decreased safety awareness, Pain, Increased fascial restricitons, Decreased scar mobility, Hypomobility  Visit Diagnosis: Sacrococcygeal disorders, not elsewhere classified  Other lack of coordination  Other abnormalities of gait and mobility  Chronic left shoulder pain  Chronic right shoulder pain     Problem List Patient Active Problem List   Diagnosis Date Noted  . Sepsis ([place]) 07/19/2015  . HTN (hypertension) 07/19/2015  . Diabetes mellitus ([place]) 07/19/2015  . Hypothyroidism 07/19/2015  . Peripheral neuropathy 07/19/2015  . Chronic back pain 07/19/2015  . CKD (chronic kidney disease) stage 3, GFR 30-59 ml/min (HCC) 07/19/2015  . Enlarged prostate 03/25/2015  . Urine stream spraying 03/25/2015  . BPH with obstruction/lower urinary tract symptoms 09/18/2014  . Other male erectile dysfunction 09/18/2014    Jerl Mina ,PT, DPT, E-RYT  01/15/2017, 11:14 PM  [place]  MAIN [facility] SERVICES [address] [place], [place], [address] Phone: [phone]   Fax:  [phone]  Name: Paul Nagy [name] MRN: [id number] Date of Birth: 1945/03/10

## 2017-01-15 NOTE — Patient Instructions (Signed)
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

## 2017-01-28 ENCOUNTER — Ambulatory Visit: Payer: Medicare HMO | Admitting: Physical Therapy

## 2017-01-28 NOTE — Addendum Note (Signed)
Addended by: Jerl Mina on: 01/28/2017 08:48 AM   Modules accepted: Orders

## 2017-01-31 ENCOUNTER — Encounter: Payer: Self-pay | Admitting: Physical Therapy

## 2017-01-31 DIAGNOSIS — R278 Other lack of coordination: Secondary | ICD-10-CM

## 2017-01-31 DIAGNOSIS — M25511 Pain in right shoulder: Secondary | ICD-10-CM

## 2017-01-31 DIAGNOSIS — M533 Sacrococcygeal disorders, not elsewhere classified: Secondary | ICD-10-CM

## 2017-01-31 DIAGNOSIS — R2689 Other abnormalities of gait and mobility: Secondary | ICD-10-CM

## 2017-01-31 DIAGNOSIS — M25512 Pain in left shoulder: Secondary | ICD-10-CM

## 2017-01-31 DIAGNOSIS — G8929 Other chronic pain: Secondary | ICD-10-CM

## 2017-01-31 NOTE — Therapy (Signed)
[place] MAIN [facility] SERVICES [address] [place], [place], [address] Phone: [phone]   Fax:  [phone]  Patient Details  Name: Paul Rise [name] MRN: [id number] Date of Birth: [birth year] Referring Provider:  Ernestine Conrad   Encounter Date: 01/31/2017    Discharge Summary    Pt has missed 2 appts with confusion regarding his appts. Pt is currently being d/c at this time due to his need to address his B shoulder pain which is causing functional limitations for him to perform his job. Pt agreed to prioritizing his shoulder impairments and working with another PT who is more specialized with shoulder rehab and is also able to work with him with increased frequency (2x/week). Pt has received an order from his PCP for shoulder rehab and has been scheduled with a [facility].    Thank you for your referral!   Jerl Mina ,PT, DPT, E-RYT  01/31/2017, 9:02 AM  [place] [address] [place], [place], [address] Phone: [phone]   Fax:  [phone]

## 2017-02-01 ENCOUNTER — Ambulatory Visit: Payer: Medicare HMO | Attending: Internal Medicine

## 2017-02-01 ENCOUNTER — Other Ambulatory Visit: Payer: Self-pay

## 2017-02-01 DIAGNOSIS — M6281 Muscle weakness (generalized): Secondary | ICD-10-CM | POA: Diagnosis not present

## 2017-02-01 DIAGNOSIS — G8929 Other chronic pain: Secondary | ICD-10-CM | POA: Diagnosis not present

## 2017-02-01 DIAGNOSIS — M25511 Pain in right shoulder: Secondary | ICD-10-CM | POA: Diagnosis not present

## 2017-02-01 DIAGNOSIS — M25512 Pain in left shoulder: Secondary | ICD-10-CM | POA: Diagnosis not present

## 2017-02-01 NOTE — Therapy (Signed)
[place] [facility] [address], [place], [address] Phone: [phone]   Fax:  [phone]  Physical Therapy Evaluation  Patient Details  Name: Paul Loiseau [name] MRN: [id number] Date of Birth: [birth year] Referring Provider: Dr Lavera Guise   Encounter Date: 02/01/2017  PT End of Session - 02/01/17 1147    Visit Number  1    Number of Visits  7    Date for PT Re-Evaluation  03/15/17    PT Start Time  0845    PT Stop Time  0945    PT Time Calculation (min)  60 min    Activity Tolerance  Patient tolerated treatment well    Behavior During Therapy  [facility] for tasks assessed/performed       Past Medical History:  Diagnosis Date  . Acute prostatitis   . BPH (benign prostatic hyperplasia)   . Chronic kidney disease   . Dysplasia of prostate   . Erectile dysfunction   . HTN (hypertension)   . Hypogonadism in male   . Orchitis and epididymitis   . Over weight   . Palindromic rheumatism, hand   . Prostatitis   . Rectum pain   . Testicle swelling   . Testicle tenderness   . Testicular mass     Past Surgical History:  Procedure Laterality Date  . BACK SURGERY    . head surgery     from fall  drilled hole in brain to relieve pressure    There were no vitals filed for this visit.   Subjective Assessment - 02/01/17 0850    Subjective  Bilateral shoulder pain    Pertinent History  Pt reports bilateral shoulder pain of at least 1 year. Pt reports more pain in his L shoulder than R. Pt reports a "rock" hit his L shoulder while working last summer which increased his pain but he was having pain prior to this injury. No known trauma or injury to the right shoulder. Pt reports that his pain has been improving. Pt received a shot in his L shoulder recently with mild reduction in his pain. Pt reports gross weakness with shoulder elevation bilaterally. Pt describes the pain as "straining." Worst pain: 8/10, Best: 0/10,  Present: 0/10. Aggravating factors: lifting arms overhead, otherwise unknown; Easing factors: health supplement but pt unsure?, rest. Pt denies history of neck pain. Pt reports some mild tingling on the medial aspect of the L arm down to the level of the elbow. No RUE N/T. No change throughout the day. Pain doesn't wake him up at night. Pt states that sometimes arms feels "tight" and "swollen" on the inside. 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 Pt is left handed. Pt denies chills, fevers, night sweats, weight loss, history of cancer.      Limitations  Lifting;Other (comment) working overhead    Diagnostic tests  10/27/15 radiographs: There is degenerative spurring at the acromioclavicular joint margins of each shoulder. There are corticated lucency suggesting enthesophyte pathic cysts or erosions at each superior humerus tuberosity. There is degenerative spurring at the inferior glenoid margin left greater than right shoulder. No fracture nor dislocation is evident.    Patient Stated Goals  "I want to get more strength." Pt is unable to work over his head due to weakness. "I can live with the pain."     Currently in Pain?  No/denies    Pain Score  0-No pain Worst: 8/10    Pain Location  Shoulder  Pain Orientation  Right;Left;Other (Comment) superior part of shoulder    Pain Descriptors / Indicators  -- "straining"    Pain Type  Chronic pain    Pain Radiating Towards  None, Pt reports some mild tingling on the medial aspect of the L arm down to the level of the elbow.    Pain Onset  More than a month ago    Pain Frequency  Intermittent    Aggravating Factors   lifting arms overhead, otherwise unknown;    Pain Relieving Factors   health supplement but pt unsure what it is, rest    Effect of Pain on Daily Activities  Pt reports that he has to modify the way he dresses. Significantly impacts his work    Multiple Pain Sites  -- Chronic low back pain, not related to current episode          [facility] PT Assessment - 02/01/17 0850      Assessment   Medical Diagnosis  Bilateral shoulder pain    Referring Provider  Dr Lavera Guise    Onset Date/Surgical Date  02/02/16 Approximate. "1 year ago"    Hand Dominance  Left    Next MD Visit  Will see PCP in 3 months    Prior Therapy  Recent chiropractic care for his shoulders      Precautions   Precautions  None      Restrictions   Weight Bearing Restrictions  No      Balance Screen   Has the patient fallen in the past 6 months  Yes    How many times?  1    Has the patient had a decrease in activity level because of a fear of falling?   Yes Significant impact in ability to perform job    Is the patient reluctant to leave their home because of a fear of falling?   No      Home Film/video editor residence      Prior Function   Level of Independence  Independent    Vocation  Part time employment    Vocation Requirements  Pours and smooths concrete. Frequently has to work overhead    Leisure  Works on cars, Government social research officer Status  Within [employer] for tasks assessed      Observation/Other Assessments   Other Surveys   --      Sensation   Additional Comments  C2-T2 light touch sensation intact bilaterally      Posture/Postural Control   Posture Comments  Forward head and rounded shoulders. No winging noted. Midline low back scar from lumbar fusion surgery      ROM / Strength   AROM / PROM / Strength  AROM;Strength      AROM   Overall AROM Comments  Positive drop arm test bilaterally. Shoulder shrug bilaterally. No pain or reproduction of symptoms with cervical AROM with overpressure.     AROM Assessment Site  Shoulder    Right/Left Shoulder  Right;Left    Right Shoulder Flexion  35 Degrees    Right Shoulder ABduction  65 Degrees    Right Shoulder Internal Rotation  70 Degrees    Right Shoulder External Rotation  60 Degrees    Left Shoulder Flexion  25  Degrees    Left Shoulder ABduction  55 Degrees    Left Shoulder Internal Rotation  70 Degrees    Left Shoulder External Rotation  60 Degrees      PROM   Overall PROM Comments  L shoulder PROM IR/ER is approximately symmetrical to AROM. Bilateral PROM flexion and abduction is only mildly limited at end range but notable end range pain reported.       Strength   Overall Strength Comments  Grossly weak bilateral shouler flexion and abduction, approximately 2/5. ER is significantly weak bilaterally (3/5) but less so than flexion/abduction. IR strength appears grossly intact bilaterally, measured at 4+/5. Bilateral shouler extension is strong. Mid trap strength is grossly 4/5 on R and 4-/5 L. Bicep/tricep, wrist flex/ext, and grip strength grossly 5/5      Palpation   Palpation comment  No pain with palpation along entire shoulder girdle bilaterally. No gross muscle wasting observed                               Objective measurements completed on examination: See above findings.     Pt instructed in use of pulleys to maintain ROM for flexion and abduction while waiting for appointment to see orthopedic surgeon. Pt unable to perform wand exercises due to bilateral nature of symptoms.         .                PT Education - 02/01/17 1147    Education provided  Yes    Education Details  Plan of care, need to see orthopedic surgeon, pulleys    Person(s) Educated  Patient    Methods  Explanation    Comprehension  Verbalized understanding       PT Short Term Goals - 02/01/17 1201      PT SHORT TERM GOAL #1   Title  Pt will be independent with HEP in order to improve strength and decrease pain in order to improve function at home and work.    Time  4    Period  Weeks    Status  New        PT Long Term Goals - 02/01/17 1653      PT LONG TERM GOAL #1   Title  Pt will decrease worst shoulder pain as reported on NPRS by at least 2 points in order  to demonstrate clinically significant reduction in shoulder pain.    Baseline  02/01/17: Worst: 8/10    Time  6    Period  Weeks    Status  New    Target Date  03/15/17      PT LONG TERM GOAL #2   Title  Pt will demonstrate at least 1/2 MMT improvement in strength for mid trap in order to demonstrate improved scapular stability    Baseline  02/01/17: L: 4-/5, R: 4/5    Time  6    Period  Weeks    Status  New    Target Date  03/15/17      PT LONG TERM GOAL #3   Title  Pt will improve bilateral shoulder flexion range of motion by at least 20 degrees actively in order to better perform self-care ADLs such as dressing.    Baseline  02/01/17: R: 35 degrees, L: 25 degrees;    Time  6    Period  Weeks    Status  New    Target Date  03/15/17      PT LONG TERM GOAL #4   Title  Pt will improve bilateral shoulder ER strength by  at least 1/2 MMT grade in order to better perform self-care tasks such as bathing    Baseline  02/01/17: 3/5 bilaterally    Time  6    Period  Weeks    Status  New    Target Date  03/15/17             Plan - 02/01/17 1157    Clinical Impression Statement  Pt is a pleasant 72 yo male referred for bilateral shoulder pain of at least 1 year that prevents him from doing his work, particularly jobs that require overhead reaching. Pt reporting more pain in his left shoulder than his right. After extensive conversation with patient it sounds like he has already seen an orthopedic surgeon who advised him to have surgery due to a "tear" in his shoulder. PT evaluation confirms gross weakness with elevation of both shoulders. Pt is only able to actively flex his shoulders 25-35 degrees bilaterally and abduct them 55-65 degrees. Pt also with significant bilateral shoulder external rotation weakness. Positive drop arm bilaterally. PROM of bilateral shoulders is grossly WFL with some mild loss at end range and significant end range pain but no indication for significant mobility  limitations passively. No pain with shoulder palpation and no neurological symptoms. Sensation is grossly intact. 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 He states that he was trying to avoid surgery but agrees that it might be the only option at this time. Pt given the choice to follow-up with therapy 1x/wk for additional upper quarter strengthening and pain management while waiting to see surgeon and he agrees to this plan. Pt will benefit from skilled PT services to address additional deficits in weakness/pain in order to improve pain-free function at home and work.     History and Personal Factors relevant to plan of care:   1 personal factors/comorbidities, 1 body systems/activity limitations/participation restrictions     Clinical Presentation  Stable    Clinical Presentation due to:  Essentially unchanged, mayble slightly better    Clinical Decision Making  Low    Rehab Potential  Poor    Clinical Impairments Affecting Rehab Potential  Without surgical intervention pt unlikely to be able to reach overhead against gravity or perform his job    PT Frequency  1x / week    PT Duration  6 weeks    PT Treatment/Interventions  ADLs/Self Care Home Management;Aquatic Therapy;Cryotherapy;Electrical Stimulation;Iontophoresis 4mg /ml Dexamethasone;Moist Heat;Traction;Ultrasound;Gait training;Stair training;Functional mobility training;Therapeutic activities;Therapeutic exercise;Neuromuscular re-education;Balance training;Patient/family education;Manual techniques;Passive range of motion    PT Next Visit Plan  Have pt complete DASH; progress RTC strengthening in gravity minimized positions, upper quarter strengthening, maintain shoulder ROM    PT Home Exercise Plan  pulleys for flexion and abduction to maintain ROM    Consulted  and Agree with Plan of Care  Patient       Patient will benefit from skilled therapeutic intervention in order to improve the following deficits and impairments:  Impaired UE functional use, Pain, Decreased strength  Visit Diagnosis: Chronic left shoulder pain - Plan: PT plan of care cert/re-cert  Chronic right shoulder pain - Plan: PT plan of care cert/re-cert  Muscle weakness (generalized) - Plan: PT plan of care cert/re-cert     Problem List Patient Active Problem List   Diagnosis Date Noted  . Sepsis ([place]) 07/19/2015  .  HTN (hypertension) 07/19/2015  . Diabetes mellitus ([place]) 07/19/2015  . Hypothyroidism 07/19/2015  . Peripheral neuropathy 07/19/2015  . Chronic back pain 07/19/2015  . CKD (chronic kidney disease) stage 3, GFR 30-59 ml/min (HCC) 07/19/2015  . Enlarged prostate 03/25/2015  . Urine stream spraying 03/25/2015  . BPH with obstruction/lower urinary tract symptoms 09/18/2014  . Other male erectile dysfunction 09/18/2014   Phillips Grout PT, DPT   Dametria Tuzzolino 02/01/2017, 4:59 PM  [place] [facility] [address], [place], [address] Phone: [phone]   Fax:  [phone]  Name: Branon Sabine [name] MRN: [id number] Date of Birth: 16-Dec-1945

## 2017-02-03 ENCOUNTER — Encounter: Payer: PPO | Admitting: Physical Therapy

## 2017-02-03 ENCOUNTER — Encounter: Payer: Medicare HMO | Admitting: Physical Therapy

## 2017-02-06 NOTE — Progress Notes (Deleted)
02/07/2017 4:20 PM   Paul Ramus [name] 24-Sep-1945 [id number]  Referring provider: Cletis Athens, MD [address] [place], [place] [address]  No chief complaint on file.   HPI: Patient is a 72 year old African American male with erectile dysfunction and BPH with LU TS who presents today for a possible prostate infection.    Location Severity Quality Context Timing Duration Modifying Factors Associated signs/symptoms            OR Status of 3 chronic or inactive problems   PMH: Past Medical History:  Diagnosis Date  . Acute prostatitis   . BPH (benign prostatic hyperplasia)   . Chronic kidney disease   . Dysplasia of prostate   . Erectile dysfunction   . HTN (hypertension)   . Hypogonadism in male   . Orchitis and epididymitis   . Over weight   . Palindromic rheumatism, hand   . Prostatitis   . Rectum pain   . Testicle swelling   . Testicle tenderness   . Testicular mass     Surgical History: Past Surgical History:  Procedure Laterality Date  . BACK SURGERY    . head surgery     from fall  drilled hole in brain to relieve pressure    Home Medications:  Allergies as of 02/07/2017      Reactions   Enalapril Maleate Other (See Comments)      Medication List        Accurate as of 02/06/17  4:20 PM. Always use your most recent med list.          acetaminophen 650 MG suppository Commonly known as:  TYLENOL Place 1 suppository (650 mg total) rectally every 6 (six) hours as needed for mild pain (or Fever >/= 101).   amLODipine 10 MG tablet Commonly known as:  NORVASC Take by mouth.   aspirin 325 MG tablet Take 325 mg by mouth daily.   carvedilol 25 MG tablet Commonly known as:  COREG Take 25 mg by mouth.   cyclobenzaprine 10 MG tablet Commonly known as:  FLEXERIL Take 10 mg by mouth.   finasteride 5 MG tablet Commonly known as:  PROSCAR Take 1 tablet (5 mg total) by mouth daily.   gabapentin 300 MG capsule Commonly known as:   NEURONTIN Take 1 capsule by mouth at bedtime.   LEVOTHYROXINE SODIUM PO Take by mouth.   lisinopril-hydrochlorothiazide 20-12.5 MG tablet Commonly known as:  PRINZIDE,ZESTORETIC Take 1 tablet by mouth daily.   metFORMIN 1000 MG tablet Commonly known as:  GLUCOPHAGE Take 1,000 mg by mouth.   methocarbamol 750 MG tablet Commonly known as:  ROBAXIN Take 750 mg by mouth every 8 (eight) hours as needed. Reported on 06/02/2015   metoprolol succinate 100 MG 24 hr tablet Commonly known as:  TOPROL-XL Take 100 mg by mouth daily. Take with or immediately following a meal.   oxyCODONE-acetaminophen 5-325 MG tablet Commonly known as:  PERCOCET/ROXICET Take by mouth every 4 (four) hours as needed for severe pain.   rosuvastatin 10 MG tablet Commonly known as:  CRESTOR 1 tablet daily.   sildenafil 100 MG tablet Commonly known as:  VIAGRA Take 100 mg by mouth daily as needed for erectile dysfunction.   spironolactone 100 MG tablet Commonly known as:  ALDACTONE Take 100 mg by mouth daily.   tamsulosin 0.4 MG Caps capsule Commonly known as:  FLOMAX Take 1 capsule (0.4 mg total) by mouth daily. Reported on 06/02/2015   traMADol 50  MG tablet Commonly known as:  ULTRAM Take 50 mg by mouth daily. Reported on 06/02/2015   vardenafil 20 MG tablet Commonly known as:  LEVITRA Take 20 mg by mouth daily as needed for erectile dysfunction. Reported on 06/02/2015       Allergies:  Allergies  Allergen Reactions  . Enalapril Maleate Other (See Comments)    Family History: Family History  Problem Relation Age of Onset  . Cancer Maternal Aunt   . Prostate cancer Neg Hx   . Kidney disease Neg Hx   . Kidney cancer Neg Hx   . Bladder Cancer Neg Hx     Social History:  reports that he has quit smoking. he has never used smokeless tobacco. He reports that he does not drink alcohol or use drugs.  ROS:                                        Physical Exam: There were  no vitals taken for this visit.  Constitutional: Well nourished. Alert and oriented, No acute distress. HEENT: [place] AT, moist mucus membranes. Trachea midline, no masses. Cardiovascular: No clubbing, cyanosis, or edema. Respiratory: Normal respiratory effort, no increased work of breathing. GI: Abdomen is soft, non tender, non distended, no abdominal masses. Liver and spleen not palpable.  No hernias appreciated.  Stool sample for occult testing is not indicated.   GU: No CVA tenderness.  No bladder fullness or masses.  Patient with circumcised phallus.   Urethral meatus is patent.  No penile discharge. No penile lesions or rashes. Scrotum without lesions, cysts, rashes and/or edema.  Testicles are located scrotally bilaterally. No masses are appreciated in the testicles. Left and right epididymis are normal. Rectal: Patient with  normal sphincter tone. Anus and perineum without scarring or rashes. No rectal masses are appreciated. Prostate is approximately 50 grams, no nodules are appreciated. Seminal vesicles are normal. Skin: No rashes, bruises or suspicious lesions. Lymph: No cervical or inguinal adenopathy. Neurologic: Grossly intact, no focal deficits, moving all 4 extremities. Psychiatric: Normal mood and affect.  Constitutional: Well nourished. Alert and oriented, No acute distress. HEENT: [place] AT, moist mucus membranes. Trachea midline, no masses. Cardiovascular: No clubbing, cyanosis, or edema. Respiratory: Normal respiratory effort, no increased work of breathing. GI: Abdomen is soft, non tender, non distended, no abdominal masses. Liver and spleen not palpable.  No hernias appreciated.  Stool sample for occult testing is not indicated.   GU: No CVA tenderness.  No bladder fullness or masses.  Patient with circumcised/uncircumcised phallus. ***Foreskin easily retracted***  Urethral meatus is patent.  No penile discharge. No penile lesions or rashes. Scrotum without lesions, cysts, rashes  and/or edema.  Testicles are located scrotally bilaterally. No masses are appreciated in the testicles. Left and right epididymis are normal. Rectal: Patient with  normal sphincter tone. Anus and perineum without scarring or rashes. No rectal masses are appreciated. Prostate is approximately *** grams, *** nodules are appreciated. Seminal vesicles are normal. Skin: No rashes, bruises or suspicious lesions. Lymph: No cervical or inguinal adenopathy. Neurologic: Grossly intact, no focal deficits, moving all 4 extremities. Psychiatric: Normal mood and affect.   Laboratory Data: PSA 0.1 ng/mL on 02/27/2016 Lab Results  Component Value Date   TESTOSTERONE 480 03/08/2016   Urinalysis ***   See Epic.   I have reviewed the labs.  Assessment & Plan:    1. Pelvic floor  dysfunction  - explained to the patient that the "pelvic floor muscles" are a group of muscles that are arranged within the pelvis like a sling or hammock, connecting the front, back, and sides of the pelvis and sacrum. The main function of these muscles is to provide support to the organs of the pelvis, including the bladder, uterus or prostate, and rectum. They also make up part of the urethra and rectum.  These muscles must be able to effectively coordinate contraction and relaxation to allow normal functioning of the bowel and bladder. Moreover, the ability of these muscles to relax is essential to allow for normal urination, bowel movements, and sexual intercourse.  - explained that "Pelvic Floor Dysfunction," or PFD, refers to these muscles when they are too relaxed or when they have too much tension. Abnormal muscle tone can affect urinary and bowel functions, sexual function, and can cause pain.  - recommended PT for further evaluation and treatment     No Follow-up on file.  These notes generated with voice recognition software. I apologize for typographical errors.  Zara Council, [facility] [address], [place], [place] [address] [phone]

## 2017-02-07 ENCOUNTER — Ambulatory Visit: Payer: Medicare HMO | Admitting: Urology

## 2017-02-08 ENCOUNTER — Ambulatory Visit: Payer: Medicare HMO

## 2017-02-10 ENCOUNTER — Encounter: Payer: Medicare HMO | Admitting: Physical Therapy

## 2017-02-10 ENCOUNTER — Encounter: Payer: PPO | Admitting: Physical Therapy

## 2017-02-13 NOTE — Progress Notes (Deleted)
02/14/2017 5:22 PM   Paul Ramus [name] 09/21/1945 [id number]  Referring provider: Cletis Athens, MD [address] [place], [place] [address]  No chief complaint on file.   HPI: Patient is a 72 year old African American male with erectile dysfunction and BPH with LU TS who presents today for a possible prostate infection.    Location Severity Quality Context Timing Duration Modifying Factors Associated signs/symptoms            OR Status of 3 chronic or inactive problems   PMH: Past Medical History:  Diagnosis Date  . Acute prostatitis   . BPH (benign prostatic hyperplasia)   . Chronic kidney disease   . Dysplasia of prostate   . Erectile dysfunction   . HTN (hypertension)   . Hypogonadism in male   . Orchitis and epididymitis   . Over weight   . Palindromic rheumatism, hand   . Prostatitis   . Rectum pain   . Testicle swelling   . Testicle tenderness   . Testicular mass     Surgical History: Past Surgical History:  Procedure Laterality Date  . BACK SURGERY    . head surgery     from fall  drilled hole in brain to relieve pressure    Home Medications:  Allergies as of 02/14/2017      Reactions   Enalapril Maleate Other (See Comments)      Medication List        Accurate as of 02/13/17  5:22 PM. Always use your most recent med list.          acetaminophen 650 MG suppository Commonly known as:  TYLENOL Place 1 suppository (650 mg total) rectally every 6 (six) hours as needed for mild pain (or Fever >/= 101).   amLODipine 10 MG tablet Commonly known as:  NORVASC Take by mouth.   aspirin 325 MG tablet Take 325 mg by mouth daily.   carvedilol 25 MG tablet Commonly known as:  COREG Take 25 mg by mouth.   cyclobenzaprine 10 MG tablet Commonly known as:  FLEXERIL Take 10 mg by mouth.   finasteride 5 MG tablet Commonly known as:  PROSCAR Take 1 tablet (5 mg total) by mouth daily.   gabapentin 300 MG capsule Commonly known as:   NEURONTIN Take 1 capsule by mouth at bedtime.   LEVOTHYROXINE SODIUM PO Take by mouth.   lisinopril-hydrochlorothiazide 20-12.5 MG tablet Commonly known as:  PRINZIDE,ZESTORETIC Take 1 tablet by mouth daily.   metFORMIN 1000 MG tablet Commonly known as:  GLUCOPHAGE Take 1,000 mg by mouth.   methocarbamol 750 MG tablet Commonly known as:  ROBAXIN Take 750 mg by mouth every 8 (eight) hours as needed. Reported on 06/02/2015   metoprolol succinate 100 MG 24 hr tablet Commonly known as:  TOPROL-XL Take 100 mg by mouth daily. Take with or immediately following a meal.   oxyCODONE-acetaminophen 5-325 MG tablet Commonly known as:  PERCOCET/ROXICET Take by mouth every 4 (four) hours as needed for severe pain.   rosuvastatin 10 MG tablet Commonly known as:  CRESTOR 1 tablet daily.   sildenafil 100 MG tablet Commonly known as:  VIAGRA Take 100 mg by mouth daily as needed for erectile dysfunction.   spironolactone 100 MG tablet Commonly known as:  ALDACTONE Take 100 mg by mouth daily.   tamsulosin 0.4 MG Caps capsule Commonly known as:  FLOMAX Take 1 capsule (0.4 mg total) by mouth daily. Reported on 06/02/2015   traMADol 50  MG tablet Commonly known as:  ULTRAM Take 50 mg by mouth daily. Reported on 06/02/2015   vardenafil 20 MG tablet Commonly known as:  LEVITRA Take 20 mg by mouth daily as needed for erectile dysfunction. Reported on 06/02/2015       Allergies:  Allergies  Allergen Reactions  . Enalapril Maleate Other (See Comments)    Family History: Family History  Problem Relation Age of Onset  . Cancer Maternal Aunt   . Prostate cancer Neg Hx   . Kidney disease Neg Hx   . Kidney cancer Neg Hx   . Bladder Cancer Neg Hx     Social History:  reports that he has quit smoking. he has never used smokeless tobacco. He reports that he does not drink alcohol or use drugs.  ROS:                                        Physical Exam: There were  no vitals taken for this visit.  Constitutional: Well nourished. Alert and oriented, No acute distress. HEENT: [place] AT, moist mucus membranes. Trachea midline, no masses. Cardiovascular: No clubbing, cyanosis, or edema. Respiratory: Normal respiratory effort, no increased work of breathing. GI: Abdomen is soft, non tender, non distended, no abdominal masses. Liver and spleen not palpable.  No hernias appreciated.  Stool sample for occult testing is not indicated.   GU: No CVA tenderness.  No bladder fullness or masses.  Patient with circumcised phallus.   Urethral meatus is patent.  No penile discharge. No penile lesions or rashes. Scrotum without lesions, cysts, rashes and/or edema.  Testicles are located scrotally bilaterally. No masses are appreciated in the testicles. Left and right epididymis are normal. Rectal: Patient with  normal sphincter tone. Anus and perineum without scarring or rashes. No rectal masses are appreciated. Prostate is approximately 50 grams, no nodules are appreciated. Seminal vesicles are normal. Skin: No rashes, bruises or suspicious lesions. Lymph: No cervical or inguinal adenopathy. Neurologic: Grossly intact, no focal deficits, moving all 4 extremities. Psychiatric: Normal mood and affect.  Constitutional: Well nourished. Alert and oriented, No acute distress. HEENT: [place] AT, moist mucus membranes. Trachea midline, no masses. Cardiovascular: No clubbing, cyanosis, or edema. Respiratory: Normal respiratory effort, no increased work of breathing. GI: Abdomen is soft, non tender, non distended, no abdominal masses. Liver and spleen not palpable.  No hernias appreciated.  Stool sample for occult testing is not indicated.   GU: No CVA tenderness.  No bladder fullness or masses.  Patient with circumcised/uncircumcised phallus. ***Foreskin easily retracted***  Urethral meatus is patent.  No penile discharge. No penile lesions or rashes. Scrotum without lesions, cysts, rashes  and/or edema.  Testicles are located scrotally bilaterally. No masses are appreciated in the testicles. Left and right epididymis are normal. Rectal: Patient with  normal sphincter tone. Anus and perineum without scarring or rashes. No rectal masses are appreciated. Prostate is approximately *** grams, *** nodules are appreciated. Seminal vesicles are normal. Skin: No rashes, bruises or suspicious lesions. Lymph: No cervical or inguinal adenopathy. Neurologic: Grossly intact, no focal deficits, moving all 4 extremities. Psychiatric: Normal mood and affect.   Laboratory Data: PSA 0.1 ng/mL on 02/27/2016 Lab Results  Component Value Date   TESTOSTERONE 480 03/08/2016   Urinalysis ***   See Epic.   I have reviewed the labs.  Assessment & Plan:    1. Pelvic floor  dysfunction  - explained to the patient that the "pelvic floor muscles" are a group of muscles that are arranged within the pelvis like a sling or hammock, connecting the front, back, and sides of the pelvis and sacrum. The main function of these muscles is to provide support to the organs of the pelvis, including the bladder, uterus or prostate, and rectum. They also make up part of the urethra and rectum.  These muscles must be able to effectively coordinate contraction and relaxation to allow normal functioning of the bowel and bladder. Moreover, the ability of these muscles to relax is essential to allow for normal urination, bowel movements, and sexual intercourse.  - explained that "Pelvic Floor Dysfunction," or PFD, refers to these muscles when they are too relaxed or when they have too much tension. Abnormal muscle tone can affect urinary and bowel functions, sexual function, and can cause pain.  - recommended PT for further evaluation and treatment     No Follow-up on file.  These notes generated with voice recognition software. I apologize for typographical errors.  Zara Council, [facility] [address] [place], [place] [address] [phone]

## 2017-02-14 ENCOUNTER — Encounter: Payer: Self-pay | Admitting: Urology

## 2017-02-14 ENCOUNTER — Ambulatory Visit: Payer: Medicare HMO | Admitting: Urology

## 2017-02-15 ENCOUNTER — Ambulatory Visit: Payer: Medicare HMO | Admitting: Physical Therapy

## 2017-02-16 DIAGNOSIS — Z79891 Long term (current) use of opiate analgesic: Secondary | ICD-10-CM | POA: Diagnosis not present

## 2017-02-16 DIAGNOSIS — M25512 Pain in left shoulder: Secondary | ICD-10-CM | POA: Diagnosis not present

## 2017-02-16 DIAGNOSIS — M25511 Pain in right shoulder: Secondary | ICD-10-CM | POA: Diagnosis not present

## 2017-02-16 DIAGNOSIS — G894 Chronic pain syndrome: Secondary | ICD-10-CM | POA: Diagnosis not present

## 2017-02-17 ENCOUNTER — Encounter: Payer: Medicare HMO | Admitting: Physical Therapy

## 2017-02-17 ENCOUNTER — Ambulatory Visit: Payer: Medicare HMO | Admitting: Urology

## 2017-02-17 ENCOUNTER — Encounter: Payer: Self-pay | Admitting: Urology

## 2017-02-17 VITALS — BP 154/83 | HR 62 | Ht 72.0 in | Wt 234.7 lb

## 2017-02-17 DIAGNOSIS — M6289 Other specified disorders of muscle: Secondary | ICD-10-CM

## 2017-02-17 DIAGNOSIS — N529 Male erectile dysfunction, unspecified: Secondary | ICD-10-CM | POA: Diagnosis not present

## 2017-02-17 DIAGNOSIS — N400 Enlarged prostate without lower urinary tract symptoms: Secondary | ICD-10-CM

## 2017-02-17 NOTE — Progress Notes (Signed)
02/17/2017 12:04 PM   Paul Ramus [name] 11-28-1945 [id number]  Referring provider: Cletis Athens, MD [address] [place], [place] [address]  Chief Complaint  Patient presents with  . Benign Prostatic Hypertrophy    HPI: The patient is a 72 year old gentleman who presents today for follow-up of his erectile dysfunction and BPH.   1.  Erectile dysfunction The patient is currently on Viagra.  He does not take nitrates.  He takes his occasionally.  It works well for him.  His only complaint is that he sometimes has discomfort with ejaculation.  2.  BPH His major complaint is nocturia x 2.  IPSS was 8/1.  He is overall pleased with his urinary status He will not take the tamsulosin and finasteride due to sexual side effects.  3.  Pelvic floor dysfunction The patient has seen pelvic floor physical therapy for his pelvic floor dysfunction.  He has had some discomfort with ejaculation and urination for which she was seen pelvic floor PT.  He stopped this treatment to work on PT for his rotator cuff.  He started Cipro risks by his PCP for discomfort with urination.  He feels that has helped.   PMH: Past Medical History:  Diagnosis Date  . Acute prostatitis   . BPH (benign prostatic hyperplasia)   . Chronic kidney disease   . Dysplasia of prostate   . Erectile dysfunction   . HTN (hypertension)   . Hypogonadism in male   . Orchitis and epididymitis   . Over weight   . Palindromic rheumatism, hand   . Prostatitis   . Rectum pain   . Testicle swelling   . Testicle tenderness   . Testicular mass     Surgical History: Past Surgical History:  Procedure Laterality Date  . BACK SURGERY    . head surgery     from fall  drilled hole in brain to relieve pressure    Home Medications:  Allergies as of 02/17/2017      Reactions   Enalapril Maleate Other (See Comments)      Medication List        Accurate as of 02/17/17 12:04 PM. Always use your most recent med list.          acetaminophen 650 MG suppository Commonly known as:  TYLENOL Place 1 suppository (650 mg total) rectally every 6 (six) hours as needed for mild pain (or Fever >/= 101).   amLODipine 10 MG tablet Commonly known as:  NORVASC Take by mouth.   aspirin 325 MG tablet Take 325 mg by mouth daily.   carvedilol 25 MG tablet Commonly known as:  COREG Take 25 mg by mouth.   ciprofloxacin 250 MG tablet Commonly known as:  CIPRO Take 250 mg by mouth 2 (two) times daily.   cyclobenzaprine 10 MG tablet Commonly known as:  FLEXERIL Take 10 mg by mouth.   finasteride 5 MG tablet Commonly known as:  PROSCAR Take 1 tablet (5 mg total) by mouth daily.   gabapentin 300 MG capsule Commonly known as:  NEURONTIN Take 1 capsule by mouth at bedtime.   LEVOTHYROXINE SODIUM PO Take by mouth.   lisinopril-hydrochlorothiazide 20-12.5 MG tablet Commonly known as:  PRINZIDE,ZESTORETIC Take 1 tablet by mouth daily.   metFORMIN 1000 MG tablet Commonly known as:  GLUCOPHAGE Take 1,000 mg by mouth.   methocarbamol 750 MG tablet Commonly known as:  ROBAXIN Take 750 mg by mouth every 8 (eight) hours as needed. Reported on 06/02/2015  metoprolol succinate 100 MG 24 hr tablet Commonly known as:  TOPROL-XL Take 100 mg by mouth daily. Take with or immediately following a meal.   oxyCODONE-acetaminophen 5-325 MG tablet Commonly known as:  PERCOCET/ROXICET Take by mouth every 4 (four) hours as needed for severe pain.   rosuvastatin 10 MG tablet Commonly known as:  CRESTOR 1 tablet daily.   sildenafil 100 MG tablet Commonly known as:  VIAGRA Take 100 mg by mouth daily as needed for erectile dysfunction.   spironolactone 100 MG tablet Commonly known as:  ALDACTONE Take 100 mg by mouth daily.   tamsulosin 0.4 MG Caps capsule Commonly known as:  FLOMAX Take 1 capsule (0.4 mg total) by mouth daily. Reported on 06/02/2015   traMADol 50 MG tablet Commonly known as:  ULTRAM Take 50 mg by  mouth daily. Reported on 06/02/2015   vardenafil 20 MG tablet Commonly known as:  LEVITRA Take 20 mg by mouth daily as needed for erectile dysfunction. Reported on 06/02/2015       Allergies:  Allergies  Allergen Reactions  . Enalapril Maleate Other (See Comments)    Family History: Family History  Problem Relation Age of Onset  . Cancer Maternal Aunt   . Prostate cancer Neg Hx   . Kidney disease Neg Hx   . Kidney cancer Neg Hx   . Bladder Cancer Neg Hx     Social History:  reports that he has quit smoking. he has never used smokeless tobacco. He reports that he does not drink alcohol or use drugs.  ROS: UROLOGY Frequent Urination?: No Hard to postpone urination?: No Burning/pain with urination?: No Get up at night to urinate?: No Leakage of urine?: No Urine stream starts and stops?: No Trouble starting stream?: No Do you have to strain to urinate?: No Blood in urine?: No Urinary tract infection?: No Sexually transmitted disease?: No Injury to kidneys or bladder?: No Painful intercourse?: Yes Weak stream?: No Erection problems?: No Penile pain?: No  Gastrointestinal Nausea?: No Vomiting?: No Indigestion/heartburn?: Yes Diarrhea?: No Constipation?: No  Constitutional Fever: No Night sweats?: No Weight loss?: No Fatigue?: No  Skin Skin rash/lesions?: No Itching?: No  Eyes Blurred vision?: Yes Double vision?: No  Ears/Nose/Throat Sore throat?: No Sinus problems?: No  Hematologic/Lymphatic Swollen glands?: No Easy bruising?: No  Cardiovascular Leg swelling?: No Chest pain?: No  Respiratory Cough?: No Shortness of breath?: No  Endocrine Excessive thirst?: No  Musculoskeletal Back pain?: Yes Joint pain?: Yes  Neurological Headaches?: No Dizziness?: No  Psychologic Depression?: No Anxiety?: No  Physical Exam: BP (!) 154/83 (BP Location: Left Arm, Patient Position: Sitting, Cuff Size: Normal)   Pulse 62   Ht 6' (1.829 m)   Wt  234 lb 11.2 oz (106.5 kg)   BMI 31.83 kg/m   Constitutional:  Alert and oriented, No acute distress. HEENT: [place] AT, moist mucus membranes.  Trachea midline, no masses. Cardiovascular: No clubbing, cyanosis, or edema. Respiratory: Normal respiratory effort, no increased work of breathing. GI: Abdomen is soft, nontender, nondistended, no abdominal masses GU: No CVA tenderness.  Skin: No rashes, bruises or suspicious lesions. Lymph: No cervical or inguinal adenopathy. Neurologic: Grossly intact, no focal deficits, moving all 4 extremities. Psychiatric: Normal mood and affect.  Laboratory Data: Lab Results  Component Value Date   WBC 12.9 (H) 10/22/2015   HGB 11.6 (L) 10/22/2015   HCT 34.7 (L) 10/22/2015   MCV 84.5 10/22/2015   PLT 155 10/22/2015    Lab Results  Component Value  Date   CREATININE 1.25 (H) 10/22/2015    No results found for: PSA  Lab Results  Component Value Date   TESTOSTERONE 480 03/08/2016    No results found for: HGBA1C  Urinalysis    Component Value Date/Time   COLORURINE YELLOW (A) 07/19/2015 2240   APPEARANCEUR CLEAR (A) 07/19/2015 2240   APPEARANCEUR Clear 06/02/2015 1426   LABSPEC 1.023 07/19/2015 2240   LABSPEC 1.019 06/16/2013 1607   PHURINE 5.0 07/19/2015 2240   GLUCOSEU NEGATIVE 07/19/2015 2240   GLUCOSEU Negative 06/16/2013 1607   HGBUR 1+ (A) 07/19/2015 2240   BILIRUBINUR NEGATIVE 07/19/2015 2240   BILIRUBINUR Negative 06/02/2015 1426   BILIRUBINUR Negative 06/16/2013 1607   KETONESUR 1+ (A) 07/19/2015 2240   PROTEINUR NEGATIVE 07/19/2015 2240   NITRITE NEGATIVE 07/19/2015 2240   LEUKOCYTESUR NEGATIVE 07/19/2015 2240   LEUKOCYTESUR Negative 06/02/2015 1426   LEUKOCYTESUR Negative 06/16/2013 1607    Assessment & Plan:    1.  BPH Symptoms well controlled without medication  2.  Erectile dysfunction Continue Viagra as needed  3.  Pelvic floor dysfunction I advised the patient to try to avoid Cipro as I do not think that the  issue of his urinary problems.  Have advised him to return to pelvic floor PT if his symptoms worsen.  He is more concerned with his rotator cuff PT at this time.  He will follow-up annually.  Return in about 1 year (around 02/17/2018).  Nickie Retort, MD  [facility] [address], [place], [place] [address] [phone]

## 2017-02-22 ENCOUNTER — Ambulatory Visit: Payer: Medicare HMO | Admitting: Physical Therapy

## 2017-02-23 ENCOUNTER — Ambulatory Visit
Admission: RE | Admit: 2017-02-23 | Discharge: 2017-02-23 | Disposition: A | Discharge status: Home / Self Care | Payer: Medicare HMO | Source: Ambulatory Visit | Attending: Internal Medicine | Admitting: Internal Medicine

## 2017-02-23 ENCOUNTER — Other Ambulatory Visit: Payer: Self-pay | Admitting: Internal Medicine

## 2017-02-23 DIAGNOSIS — M25551 Pain in right hip: Secondary | ICD-10-CM

## 2017-02-23 DIAGNOSIS — M1611 Unilateral primary osteoarthritis, right hip: Secondary | ICD-10-CM | POA: Diagnosis not present

## 2017-02-24 ENCOUNTER — Encounter: Payer: PPO | Admitting: Physical Therapy

## 2017-02-24 ENCOUNTER — Encounter: Payer: Medicare HMO | Admitting: Physical Therapy

## 2017-03-01 ENCOUNTER — Ambulatory Visit: Payer: Medicare HMO | Admitting: Physical Therapy

## 2017-03-03 ENCOUNTER — Encounter: Payer: Medicare HMO | Admitting: Physical Therapy

## 2017-03-08 ENCOUNTER — Encounter: Payer: Medicare HMO | Admitting: Physical Therapy

## 2017-03-09 DIAGNOSIS — M5416 Radiculopathy, lumbar region: Secondary | ICD-10-CM | POA: Diagnosis not present

## 2017-03-10 ENCOUNTER — Encounter: Payer: PPO | Admitting: Physical Therapy

## 2017-03-10 ENCOUNTER — Encounter: Payer: Medicare HMO | Admitting: Physical Therapy

## 2017-03-10 ENCOUNTER — Other Ambulatory Visit: Payer: Self-pay | Admitting: Physical Medicine and Rehabilitation

## 2017-03-10 DIAGNOSIS — M5416 Radiculopathy, lumbar region: Secondary | ICD-10-CM

## 2017-03-14 ENCOUNTER — Encounter: Payer: Self-pay | Admitting: Intensive Care

## 2017-03-14 ENCOUNTER — Emergency Department
Admission: EM | Admit: 2017-03-14 | Discharge: 2017-03-14 | Disposition: A | Discharge status: Home / Self Care | Payer: Medicare HMO | Attending: Emergency Medicine | Admitting: Emergency Medicine

## 2017-03-14 ENCOUNTER — Other Ambulatory Visit: Payer: Self-pay

## 2017-03-14 DIAGNOSIS — N183 Chronic kidney disease, stage 3 (moderate): Secondary | ICD-10-CM | POA: Diagnosis not present

## 2017-03-14 DIAGNOSIS — Z87891 Personal history of nicotine dependence: Secondary | ICD-10-CM | POA: Diagnosis not present

## 2017-03-14 DIAGNOSIS — E039 Hypothyroidism, unspecified: Secondary | ICD-10-CM | POA: Diagnosis not present

## 2017-03-14 DIAGNOSIS — I129 Hypertensive chronic kidney disease with stage 1 through stage 4 chronic kidney disease, or unspecified chronic kidney disease: Secondary | ICD-10-CM | POA: Insufficient documentation

## 2017-03-14 DIAGNOSIS — G8929 Other chronic pain: Secondary | ICD-10-CM | POA: Diagnosis not present

## 2017-03-14 DIAGNOSIS — M545 Low back pain: Secondary | ICD-10-CM | POA: Diagnosis not present

## 2017-03-14 DIAGNOSIS — Z79899 Other long term (current) drug therapy: Secondary | ICD-10-CM | POA: Diagnosis not present

## 2017-03-14 DIAGNOSIS — Z7982 Long term (current) use of aspirin: Secondary | ICD-10-CM | POA: Diagnosis not present

## 2017-03-14 MED ORDER — OXYCODONE-ACETAMINOPHEN 5-325 MG PO TABS: 1.0000 | ORAL_TABLET | ORAL | 0 refills | Status: DC | PRN

## 2017-03-14 NOTE — ED Notes (Signed)

## 2017-03-14 NOTE — ED Notes (Signed)
Pt lying on the ground in treatment room. RN outside door and did not hear a fall or hear pt calling for help. Pt is awake when RN entered room and asked why he is on the floor. PT has mixed stories and originally reports he fell out of bed. Then, pt states he passed out. After RN asked further assessment questions pt reports he did not actually pass out he fell asleep and fell out of bed. MD made aware and pt was helped back into bed. No bruising or breaks in the skin no increased pain reports and no head trauma.

## 2017-03-14 NOTE — ED Triage Notes (Signed)
Patient states "my doctor told me to come get an MRI" When I asked who his doctor is, the patient states "I dont know his name, I just started going to him" Reports he didn't mention scheduling an outpatient MRI but to come to ER

## 2017-03-14 NOTE — ED Triage Notes (Addendum)
FIRST NURSE NOTE-pt reports sent here for MRI by doctor for right hip pain that radiates down right leg with numbness X 3 weeks. Ambulatory.

## 2017-03-14 NOTE — ED Provider Notes (Signed)
[facility] Emergency Department Provider Note ____________________________________________   First MD Initiated Contact with Patient 03/14/17 1819     (approximate)  I have reviewed the triage vital signs and the nursing notes.   HISTORY  Chief Complaint Back Pain (lower)    HPI Paul Piet [name] is a 72 y.o. male with past medical history as noted below who presents with lower back pain for the last 3 weeks, relatively constant, radiating down the right leg, and associated with numbness on the inner right lower leg.  Patient also reports decreased sensation to his penis, but no urinary incontinence, urinary retention, or fecal incontinence or constipation.  No saddle area anesthesia.  The patient denies any acute trauma.  Patient states he was seen at the orthopedics office 4 days ago for this, and the provider there told him to go to the hospital to get an MRI although the patient states that he did not get any more specific instructions.  He also states that he was placed on hydrocodone, but that it is not adequately treating the pain.  No new symptoms within the last several days since he was seen.  Past Medical History:  Diagnosis Date  . Acute prostatitis   . BPH (benign prostatic hyperplasia)   . Chronic kidney disease   . Dysplasia of prostate   . Erectile dysfunction   . HTN (hypertension)   . Hypogonadism in male   . Orchitis and epididymitis   . Over weight   . Palindromic rheumatism, hand   . Prostatitis   . Rectum pain   . Testicle swelling   . Testicle tenderness   . Testicular mass     Patient Active Problem List   Diagnosis Date Noted  . Sepsis ([place]) 07/19/2015  . HTN (hypertension) 07/19/2015  . Diabetes mellitus ([place]) 07/19/2015  . Hypothyroidism 07/19/2015  . Peripheral neuropathy 07/19/2015  . Chronic back pain 07/19/2015  . CKD (chronic kidney disease) stage 3, GFR 30-59 ml/min (HCC) 07/19/2015  . Enlarged  prostate 03/25/2015  . Urine stream spraying 03/25/2015  . BPH with obstruction/lower urinary tract symptoms 09/18/2014  . Other male erectile dysfunction 09/18/2014    Past Surgical History:  Procedure Laterality Date  . BACK SURGERY    . head surgery     from fall  drilled hole in brain to relieve pressure    Prior to Admission medications   Medication Sig Start Date End Date Taking? Authorizing Provider  acetaminophen (TYLENOL) 650 MG suppository Place 1 suppository (650 mg total) rectally every 6 (six) hours as needed for mild pain (or Fever >/= 101). Patient not taking: Reported on 11/15/2016 07/21/15   Epifanio Lesches, MD  amLODipine (NORVASC) 10 MG tablet Take by mouth.    [provider]  aspirin 325 MG tablet Take 325 mg by mouth daily.     [provider]  carvedilol (COREG) 25 MG tablet Take 25 mg by mouth. 10/28/15   [provider]  ciprofloxacin (CIPRO) 250 MG tablet Take 250 mg by mouth 2 (two) times daily.    [provider]  cyclobenzaprine (FLEXERIL) 10 MG tablet Take 10 mg by mouth. 09/04/13   [provider]  finasteride (PROSCAR) 5 MG tablet Take 1 tablet (5 mg total) by mouth daily. Patient not taking: Reported on 11/15/2016 03/04/16   Zara Council A, PA-C  gabapentin (NEURONTIN) 300 MG capsule Take 1 capsule by mouth at bedtime.  06/30/15   [provider]  LEVOTHYROXINE SODIUM PO Take by mouth.    [provider]  lisinopril-hydrochlorothiazide (PRINZIDE,ZESTORETIC) 20-12.5 MG per tablet Take 1 tablet by mouth daily.    [provider]  metFORMIN (GLUCOPHAGE) 1000 MG tablet Take 1,000 mg by mouth.    [provider]  methocarbamol (ROBAXIN) 750 MG tablet Take 750 mg by mouth every 8 (eight) hours as needed. Reported on 06/02/2015 04/15/15   [provider]  metoprolol succinate (TOPROL-XL) 100 MG 24 hr tablet Take 100 mg by mouth daily. Take with or immediately following a  meal.    [provider]  oxyCODONE-acetaminophen (PERCOCET/ROXICET) 5-325 MG tablet Take 1 tablet by mouth every 4 (four) hours as needed for up to 5 days for severe pain. 03/14/17 03/19/17  Arta Silence, MD  rosuvastatin (CRESTOR) 10 MG tablet 1 tablet daily. 08/19/15   [provider]  sildenafil (VIAGRA) 100 MG tablet Take 100 mg by mouth daily as needed for erectile dysfunction.    [provider]  spironolactone (ALDACTONE) 100 MG tablet Take 100 mg by mouth daily.     [provider]  tamsulosin (FLOMAX) 0.4 MG CAPS capsule Take 1 capsule (0.4 mg total) by mouth daily. Reported on 06/02/2015 Patient not taking: Reported on 11/15/2016 03/04/16   Zara Council A, PA-C  traMADol (ULTRAM) 50 MG tablet Take 50 mg by mouth daily. Reported on 06/02/2015    [provider]  vardenafil (LEVITRA) 20 MG tablet Take 20 mg by mouth daily as needed for erectile dysfunction. Reported on 06/02/2015    [provider]    Allergies Enalapril maleate  Family History  Problem Relation Age of Onset  . Cancer Maternal Aunt   . Prostate cancer Neg Hx   . Kidney disease Neg Hx   . Kidney cancer Neg Hx   . Bladder Cancer Neg Hx     Social History Social History   Tobacco Use  . Smoking status: Former Research scientist (life sciences)  . Smokeless tobacco: Never Used  . Tobacco comment: quit 30 years ago  Substance Use Topics  . Alcohol use: No    Alcohol/week: 0.0 oz  . Drug use: No    Review of Systems  Constitutional: No fever. Eyes: No  redness ENT: No neck pain. Cardiovascular: Denies chest pain. Respiratory: Denies shortness of breath. Gastrointestinal: No nausea, no vomiting.   Genitourinary: Negative for dysuria or incontinence.  Musculoskeletal: Positive for back pain. Skin: Negative for rash. Neurological: Positive for right lower leg numbness.   ____________________________________________   PHYSICAL EXAM:  VITAL SIGNS: ED Triage Vitals  [03/14/17 1625]  Enc Vitals Group     BP (!) 176/74     Pulse Rate 64     Resp 16     Temp 98.4 F (36.9 C)     Temp Source Oral     SpO2 98 %     Weight 235 lb (106.6 kg)     Height 6' (1.829 m)     Head Circumference      Peak Flow      Pain Score 6     Pain Loc      Pain Edu?      Excl. in [place]?     Constitutional: Alert and oriented. Well appearing and in no acute distress. Eyes: Conjunctivae are normal.  Head: Atraumatic. Nose: No congestion/rhinnorhea. Mouth/Throat: Mucous membranes are moist.   Neck: Normal range of motion.  Cardiovascular:  Good peripheral circulation. Respiratory: Normal respiratory effort.   Gastrointestinal: No  distention.  Genitourinary: No CVA tenderness. Musculoskeletal: No lower extremity edema.  Extremities warm and well perfused.  No midline spinal tenderness. Neurologic: Slight decreased sensation to the right lower inner leg along the L3-L4 dermatome area.  5 out of 5 motor strength both proximally and distally to bilateral lower extremities.  Normal sensation to the remainder of the lower extremities bilaterally. Skin:  Skin is warm and dry. No rash noted. Psychiatric: Mood and affect are normal. Speech and behavior are normal.  ____________________________________________   LABS (all labs ordered are listed, but only abnormal results are displayed)  Labs Reviewed - No data to display ____________________________________________  EKG   ____________________________________________  RADIOLOGY    ____________________________________________   PROCEDURES  Procedure(s) performed: No  Procedures  Critical Care performed: No ____________________________________________   INITIAL IMPRESSION / ASSESSMENT AND PLAN / ED COURSE  Pertinent labs & imaging results that were available during my care of the patient were reviewed by me and considered in my medical decision making (see chart for details).  72 year old male with past  medical history as noted above presents with subacute lower back pain radiating to the right leg, associated with some numbness along the inner right lower leg for the last 3 weeks.  The patient was seen at [facility] 4 days ago for this, and states that he was told to come to the hospital to get an MRI, and not given further instructions.  On exam, the patient is well-appearing, the vital signs are normal except for hypertension, he is ambulate in without difficulty, and the neuro exam is normal except for the decreased sensation to the right inner lower leg as noted below.  In epic I see that an order was placed by Dr. Cristy Folks from [facility] on 214 for a lumbar spine MRI.  There are no notes available to me from this visit.  It is unclear from the information that the patient is able to give me and the available documentation whether the patient was instructed to proceed immediately to the emergency department for emergent MRI and simply did not come right away, or if he was planned to go for scheduled outpatient MRI and simply misunderstood and went to the ER.  Based on his clinical presentation at this time given the subacute nature of the symptoms, the lack of any motor findings, and his normal gait, there is no evidence of cauda equina or other acute neurologic emergency, there would not be an indication for an emergent MRI from the ED.  I will contact the on-call provider for [facility] and attempt to clarify the plan.    ----------------------------------------- 7:30 PM on 03/14/2017 -----------------------------------------  I discussed the case with Dr. Sabra Heck from [facility], who was able to pull up the patient's chart and confirmed that the MRI was ordered as a routine outpatient MRI and not emergent.  I passed on his instructions to the patient, that he call [facility] tomorrow morning to let the staff know that he has an MRI ordered and needs to be  scheduled.  Per RN, the patient was found lying on the floor.  He apparently told them that he fell out of bed or that he may have passed out.  When I discussed this with the patient a few minutes later, he sitting the chair comfortably, and appeared well.  He he states that he just fell asleep.  He states that since he has been on pain medication he falls asleep more easily.  He denies feeling lightheaded, dizzy, weak, or passing out.  He denies any injuries.  There is no evidence of syncope and the patient remained stable.  There is no indication based on his description of what happened and the clinical picture to work the patient up at this time.  At this time, the patient states he feels comfortable to go home.  I instructed him about calling [facility] tomorrow and he expressed understanding.  He states that he is on hydrocodone which was prescribed at his last visit on the 14th although I am not able to find this record in Epic.  I instructed him to stop the hydrocodone and will prescribe oxycodone instead for slightly increased pain relief.  I gave the patient thorough return precautions for the back pain and numbness, and he expressed understanding.  ____________________________________________   FINAL CLINICAL IMPRESSION(S) / ED DIAGNOSES  Final diagnoses:  Low back pain, unspecified back pain laterality, unspecified chronicity, with sciatica presence unspecified      NEW MEDICATIONS STARTED DURING THIS VISIT:  Discharge Medication List as of 03/14/2017  7:30 PM       Note:  This document was prepared using Dragon voice recognition software and may include unintentional dictation errors.    Arta Silence, MD 03/14/17 2023

## 2017-03-14 NOTE — Discharge Instructions (Signed)
Call [facility] tomorrow morning.  Let them know that you have an outpatient MRI ordered by Dr. Cristy Folks and that you need it to be scheduled.  We have prescribed you with oxycodone (Percocet or Roxicet).  You should discontinue the hydrocodone that you are taking and you may take this instead. DO NOT TAKE BOTH AT THE SAME TIME.    Return to the ER for new, worsening, or persistent severe pain, weakness, new numbness, difficulty walking, difficulty urinating, numbness around her rectum, or any other new or worsening symptoms that concern you.  You should follow-up with the orthopedic and rehabilitation doctor as scheduled.

## 2017-03-15 NOTE — ED Notes (Signed)
Yellow fall alert bracelet placed on pt and door tag now placed in doorway.

## 2017-03-24 ENCOUNTER — Encounter: Payer: PPO | Admitting: Physical Therapy

## 2017-03-28 ENCOUNTER — Ambulatory Visit
Admission: RE | Admit: 2017-03-28 | Discharge: 2017-03-28 | Disposition: A | Discharge status: Home / Self Care | Payer: Medicare HMO | Source: Ambulatory Visit | Attending: Physical Medicine and Rehabilitation | Admitting: Physical Medicine and Rehabilitation

## 2017-03-28 DIAGNOSIS — M5416 Radiculopathy, lumbar region: Secondary | ICD-10-CM | POA: Diagnosis not present

## 2017-03-28 DIAGNOSIS — M48061 Spinal stenosis, lumbar region without neurogenic claudication: Secondary | ICD-10-CM | POA: Diagnosis not present

## 2017-03-28 DIAGNOSIS — M5127 Other intervertebral disc displacement, lumbosacral region: Secondary | ICD-10-CM | POA: Diagnosis not present

## 2017-04-04 ENCOUNTER — Emergency Department
Admission: EM | Admit: 2017-04-04 | Discharge: 2017-04-04 | Disposition: A | Discharge status: Home / Self Care | Payer: Medicare HMO | Attending: Emergency Medicine | Admitting: Emergency Medicine

## 2017-04-04 ENCOUNTER — Emergency Department: Payer: Medicare HMO

## 2017-04-04 ENCOUNTER — Other Ambulatory Visit: Payer: Self-pay

## 2017-04-04 ENCOUNTER — Ambulatory Visit: Payer: Medicare HMO | Admitting: Urology

## 2017-04-04 ENCOUNTER — Encounter: Payer: Self-pay | Admitting: Urology

## 2017-04-04 DIAGNOSIS — Z87891 Personal history of nicotine dependence: Secondary | ICD-10-CM | POA: Diagnosis not present

## 2017-04-04 DIAGNOSIS — M25511 Pain in right shoulder: Secondary | ICD-10-CM | POA: Insufficient documentation

## 2017-04-04 DIAGNOSIS — N183 Chronic kidney disease, stage 3 (moderate): Secondary | ICD-10-CM | POA: Diagnosis not present

## 2017-04-04 DIAGNOSIS — Z7982 Long term (current) use of aspirin: Secondary | ICD-10-CM | POA: Insufficient documentation

## 2017-04-04 DIAGNOSIS — E039 Hypothyroidism, unspecified: Secondary | ICD-10-CM | POA: Diagnosis not present

## 2017-04-04 DIAGNOSIS — G8929 Other chronic pain: Secondary | ICD-10-CM

## 2017-04-04 DIAGNOSIS — Z79899 Other long term (current) drug therapy: Secondary | ICD-10-CM | POA: Diagnosis not present

## 2017-04-04 DIAGNOSIS — I129 Hypertensive chronic kidney disease with stage 1 through stage 4 chronic kidney disease, or unspecified chronic kidney disease: Secondary | ICD-10-CM | POA: Diagnosis not present

## 2017-04-04 MED ORDER — TRAMADOL HCL 50 MG PO TABS: 50.0000 mg | ORAL_TABLET | Freq: Three times a day (TID) | ORAL | 0 refills | Status: DC

## 2017-04-04 MED ORDER — OXYCODONE-ACETAMINOPHEN 5-325 MG PO TABS
2.0000 | ORAL_TABLET | Freq: Once | ORAL | Status: AC
  Administered 2017-04-04: 2 via ORAL
  Filled 2017-04-04: qty 2

## 2017-04-04 NOTE — ED Notes (Signed)
See triage note  Presents with pain to right shoulder  Denies any injury but hx of rotator cuff problems  No deformity

## 2017-04-04 NOTE — ED Notes (Signed)
20 G IV removed from left wrist  Area clean and dry  No bleeding note and cath was intact

## 2017-04-04 NOTE — ED Provider Notes (Signed)
[facility] Emergency Department Provider Note  ___________________________________________   First MD Initiated Contact with Patient 04/04/17 [phone]     (approximate)  I have reviewed the triage vital signs and the nursing notes.   HISTORY  Chief Complaint Shoulder Pain   HPI Paul Amis [name] is a 72 y.o. male is brought in via EMS for right shoulder pain.  Patient reports chronic shoulder pain due to rotator cuff injury.  Patient states that pain was worse last evening after taking a shower.  Patient was taken by wheelchair via EMS to the ED waiting room.  He denies any history of recent injury.  Currently rates his pain as 10/10.   Past Medical History:  Diagnosis Date  . Acute prostatitis   . BPH (benign prostatic hyperplasia)   . Chronic kidney disease   . Dysplasia of prostate   . Erectile dysfunction   . HTN (hypertension)   . Hypogonadism in male   . Orchitis and epididymitis   . Over weight   . Palindromic rheumatism, hand   . Prostatitis   . Rectum pain   . Testicle swelling   . Testicle tenderness   . Testicular mass     Patient Active Problem List   Diagnosis Date Noted  . Sepsis ([place]) 07/19/2015  . HTN (hypertension) 07/19/2015  . Diabetes mellitus ([place]) 07/19/2015  . Hypothyroidism 07/19/2015  . Peripheral neuropathy 07/19/2015  . Chronic back pain 07/19/2015  . CKD (chronic kidney disease) stage 3, GFR 30-59 ml/min (HCC) 07/19/2015  . Enlarged prostate 03/25/2015  . Urine stream spraying 03/25/2015  . BPH with obstruction/lower urinary tract symptoms 09/18/2014  . Other male erectile dysfunction 09/18/2014    Past Surgical History:  Procedure Laterality Date  . BACK SURGERY    . head surgery     from fall  drilled hole in brain to relieve pressure    Prior to Admission medications   Medication Sig Start Date End Date Taking? Authorizing Provider  amLODipine (NORVASC) 10 MG tablet Take by mouth.    [provider]  aspirin 325 MG tablet Take 325 mg by mouth daily.     [provider]  carvedilol (COREG) 25 MG tablet Take 25 mg by mouth. 10/28/15   [provider]  ciprofloxacin (CIPRO) 250 MG tablet Take 250 mg by mouth 2 (two) times daily.    [provider]  cyclobenzaprine (FLEXERIL) 10 MG tablet Take 10 mg by mouth. 09/04/13   [provider]  gabapentin (NEURONTIN) 300 MG capsule Take 1 capsule by mouth at bedtime.  06/30/15   [provider]  LEVOTHYROXINE SODIUM PO Take by mouth.    [provider]  lisinopril-hydrochlorothiazide (PRINZIDE,ZESTORETIC) 20-12.5 MG per tablet Take 1 tablet by mouth daily.    [provider]  metFORMIN (GLUCOPHAGE) 1000 MG tablet Take 1,000 mg by mouth.    [provider]  methocarbamol (ROBAXIN) 750 MG tablet Take 750 mg by mouth every 8 (eight) hours as needed. Reported on 06/02/2015 04/15/15   [provider]  metoprolol succinate (TOPROL-XL) 100 MG 24 hr tablet Take 100 mg by mouth daily. Take with or immediately following a meal.    [provider]  rosuvastatin (CRESTOR) 10 MG tablet 1 tablet daily. 08/19/15   [provider]  sildenafil (VIAGRA) 100 MG tablet Take 100 mg by mouth daily as needed for erectile dysfunction.    [provider]  spironolactone (ALDACTONE) 100 MG tablet Take  100 mg by mouth daily.     [provider]  traMADol (ULTRAM) 50 MG tablet Take 1 tablet (50 mg total) by mouth 3 (three) times daily. Reported on 06/02/2015 04/04/17   Paul Hai, PA-C  vardenafil (LEVITRA) 20 MG tablet Take 20 mg by mouth daily as needed for erectile dysfunction. Reported on 06/02/2015    [provider]    Allergies Enalapril maleate  Family History  Problem Relation Age of Onset  . Cancer Maternal Aunt   . Prostate cancer Neg Hx   . Kidney disease Neg Hx   . Kidney cancer Neg Hx   . Bladder Cancer Neg Hx     Social  History Social History   Tobacco Use  . Smoking status: Former Research scientist (life sciences)  . Smokeless tobacco: Never Used  . Tobacco comment: quit 30 years ago  Substance Use Topics  . Alcohol use: No    Alcohol/week: 0.0 oz  . Drug use: No    Review of Systems Constitutional: No fever/chills Eyes: No visual changes. Cardiovascular: Denies chest pain. Respiratory: Denies shortness of breath. Gastrointestinal:  No nausea, no vomiting.   Musculoskeletal: Positive for right shoulder pain. Skin: Negative for rash. Neurological: Negative for headaches, focal weakness or numbness. ____________________________________________   PHYSICAL EXAM:  VITAL SIGNS: ED Triage Vitals  Enc Vitals Group     BP 04/04/17 0348 (!) 178/88     Pulse Rate 04/04/17 0348 78     Resp 04/04/17 0348 (!) 22     Temp 04/04/17 0348 98.3 F (36.8 C)     Temp Source 04/04/17 0348 Oral     SpO2 04/04/17 0348 97 %     Weight --      Height --      Head Circumference --      Peak Flow --      Pain Score 04/04/17 0344 10     Pain Loc --      Pain Edu? --      Excl. in [place]? --    Constitutional: Alert and oriented. Well appearing and in no acute distress. Eyes: Conjunctivae are normal.  Head: Atraumatic. Neck: No stridor.   Cardiovascular: Normal rate, regular rhythm. Grossly normal heart sounds.  Good peripheral circulation. Respiratory: Normal respiratory effort.  No retractions. Lungs CTAB. Musculoskeletal: Right shoulder examination is negative for any gross deformity.  There is some tenderness on palpation of soft tissue at the proximal humeral area.  There is crepitus with range of motion.  No erythema or warmth is present.  Range of motion is restricted secondary to patient's pain.  Good muscle strength bilaterally.  Skin is intact without ecchymosis or abrasions. Neurologic:  Normal speech and language. No gross focal neurologic deficits are appreciated.  Skin:  Skin is warm, dry and intact.  Psychiatric: Mood and  affect are normal. Speech and behavior are normal.  ____________________________________________   LABS (all labs ordered are listed, but only abnormal results are displayed)  Labs Reviewed - No data to display  RADIOLOGY  ED MD interpretation:   Right shoulder x-ray is negative for acute injury.  Official radiology report(s): Dg Shoulder Right  Result Date: 04/04/2017 CLINICAL DATA:  Pain EXAM: RIGHT SHOULDER - 2+ VIEW COMPARISON:  None. FINDINGS: Oblique, Y scapular, and axillary images were obtained. No evident fracture or dislocation. 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 There is mild narrowing of the glenohumeral joint. No erosive change or intra-articular calcification. Visualized right lung clear.  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 No fracture or dislocation. Electronically Signed   By: Lowella Grip III M.D.   On: 04/04/2017 07:27    ____________________________________________   PROCEDURES  Procedure(s) performed: None  Procedures  Critical Care performed: No  ____________________________________________   INITIAL IMPRESSION / ASSESSMENT AND PLAN / ED COURSE  As part of my medical decision making, I reviewed the following data within the electronic MEDICAL RECORD NUMBER Notes from prior ED visits and [place] Controlled Substance Database  Patient states that he is going to be seeing his pain management doctor this week.  He also states he has been taking his pain medication more frequently than he was supposed to and has ran out.  Patient was discharged with prescription for tramadol 1 every 8 hours as needed for pain #9. ____________________________________________   FINAL CLINICAL IMPRESSION(S) / ED DIAGNOSES  Final diagnoses:  Chronic right shoulder pain     ED Discharge Orders        Ordered    traMADol (ULTRAM) 50 MG tablet  3 times daily     04/04/17 9030       Note:  This  document was prepared using Dragon voice recognition software and may include unintentional dictation errors.    Paul Hai, PA-C 04/04/17 1615    Earleen Newport, MD 04/05/17 [phone]

## 2017-04-04 NOTE — ED Notes (Signed)
EMS reports pt c/o right shoulder pain; pt reports he's to have rotator cuff surgery but he keeps putting it off; pt was given 147mcg Fentanyl by EMS

## 2017-04-04 NOTE — ED Triage Notes (Signed)
Patient to ED via wheelchair by EMS for right shoulder pain.  Patient reports chronic shoulder pain due to rotator cuff problems.  Reports pain worse since last night after taking a shower.  EMS placed 20 g angiocath to left wrist area.

## 2017-04-04 NOTE — Discharge Instructions (Signed)
Keep your appointment with your pain doctor on Wednesday.  Use ice to your shoulder as needed for pain.  You will need to speak to your pain doctor about your Percocet on Wednesday.  Tramadol 1 every 8 hours until you see your doctor.

## 2017-04-06 DIAGNOSIS — M25511 Pain in right shoulder: Secondary | ICD-10-CM | POA: Diagnosis not present

## 2017-04-08 DIAGNOSIS — M25511 Pain in right shoulder: Secondary | ICD-10-CM | POA: Diagnosis not present

## 2017-04-08 DIAGNOSIS — Z7984 Long term (current) use of oral hypoglycemic drugs: Secondary | ICD-10-CM | POA: Diagnosis not present

## 2017-04-08 DIAGNOSIS — M75101 Unspecified rotator cuff tear or rupture of right shoulder, not specified as traumatic: Secondary | ICD-10-CM | POA: Diagnosis not present

## 2017-04-08 DIAGNOSIS — N400 Enlarged prostate without lower urinary tract symptoms: Secondary | ICD-10-CM | POA: Diagnosis not present

## 2017-04-08 DIAGNOSIS — Z79899 Other long term (current) drug therapy: Secondary | ICD-10-CM | POA: Diagnosis not present

## 2017-04-08 DIAGNOSIS — I10 Essential (primary) hypertension: Secondary | ICD-10-CM | POA: Diagnosis not present

## 2017-04-08 DIAGNOSIS — Z87891 Personal history of nicotine dependence: Secondary | ICD-10-CM | POA: Diagnosis not present

## 2017-04-08 DIAGNOSIS — M12811 Other specific arthropathies, not elsewhere classified, right shoulder: Secondary | ICD-10-CM | POA: Diagnosis not present

## 2017-04-08 DIAGNOSIS — G4730 Sleep apnea, unspecified: Secondary | ICD-10-CM | POA: Diagnosis not present

## 2017-04-08 DIAGNOSIS — E119 Type 2 diabetes mellitus without complications: Secondary | ICD-10-CM | POA: Diagnosis not present

## 2017-04-08 DIAGNOSIS — M75121 Complete rotator cuff tear or rupture of right shoulder, not specified as traumatic: Secondary | ICD-10-CM | POA: Diagnosis not present

## 2017-04-08 DIAGNOSIS — E278 Other specified disorders of adrenal gland: Secondary | ICD-10-CM | POA: Diagnosis not present

## 2017-04-09 ENCOUNTER — Encounter: Payer: Self-pay | Admitting: *Deleted

## 2017-04-09 ENCOUNTER — Other Ambulatory Visit: Payer: Self-pay

## 2017-04-09 ENCOUNTER — Emergency Department
Admission: EM | Admit: 2017-04-09 | Discharge: 2017-04-09 | Disposition: A | Discharge status: Home / Self Care | Payer: Medicare HMO | Attending: Emergency Medicine | Admitting: Emergency Medicine

## 2017-04-09 DIAGNOSIS — I517 Cardiomegaly: Secondary | ICD-10-CM | POA: Diagnosis not present

## 2017-04-09 DIAGNOSIS — Z7982 Long term (current) use of aspirin: Secondary | ICD-10-CM | POA: Diagnosis not present

## 2017-04-09 DIAGNOSIS — Z79899 Other long term (current) drug therapy: Secondary | ICD-10-CM | POA: Insufficient documentation

## 2017-04-09 DIAGNOSIS — N183 Chronic kidney disease, stage 3 (moderate): Secondary | ICD-10-CM | POA: Diagnosis not present

## 2017-04-09 DIAGNOSIS — E039 Hypothyroidism, unspecified: Secondary | ICD-10-CM | POA: Diagnosis not present

## 2017-04-09 DIAGNOSIS — Z7984 Long term (current) use of oral hypoglycemic drugs: Secondary | ICD-10-CM | POA: Diagnosis not present

## 2017-04-09 DIAGNOSIS — I129 Hypertensive chronic kidney disease with stage 1 through stage 4 chronic kidney disease, or unspecified chronic kidney disease: Secondary | ICD-10-CM | POA: Diagnosis not present

## 2017-04-09 DIAGNOSIS — M25511 Pain in right shoulder: Secondary | ICD-10-CM | POA: Diagnosis not present

## 2017-04-09 DIAGNOSIS — S4991XA Unspecified injury of right shoulder and upper arm, initial encounter: Secondary | ICD-10-CM | POA: Diagnosis not present

## 2017-04-09 DIAGNOSIS — I10 Essential (primary) hypertension: Secondary | ICD-10-CM

## 2017-04-09 DIAGNOSIS — G8929 Other chronic pain: Secondary | ICD-10-CM

## 2017-04-09 DIAGNOSIS — E1122 Type 2 diabetes mellitus with diabetic chronic kidney disease: Secondary | ICD-10-CM | POA: Diagnosis not present

## 2017-04-09 DIAGNOSIS — E1140 Type 2 diabetes mellitus with diabetic neuropathy, unspecified: Secondary | ICD-10-CM | POA: Insufficient documentation

## 2017-04-09 DIAGNOSIS — R079 Chest pain, unspecified: Secondary | ICD-10-CM | POA: Diagnosis not present

## 2017-04-09 DIAGNOSIS — Z87891 Personal history of nicotine dependence: Secondary | ICD-10-CM | POA: Diagnosis not present

## 2017-04-09 DIAGNOSIS — M75121 Complete rotator cuff tear or rupture of right shoulder, not specified as traumatic: Secondary | ICD-10-CM | POA: Diagnosis not present

## 2017-04-09 MED ORDER — KETOROLAC TROMETHAMINE 30 MG/ML IJ SOLN
30.0000 mg | Freq: Once | INTRAMUSCULAR | Status: AC
  Administered 2017-04-09: 30 mg via INTRAMUSCULAR
  Filled 2017-04-09: qty 1

## 2017-04-09 MED ORDER — CYCLOBENZAPRINE HCL 10 MG PO TABS
10.0000 mg | ORAL_TABLET | Freq: Once | ORAL | Status: AC
  Administered 2017-04-09: 10 mg via ORAL
  Filled 2017-04-09: qty 1

## 2017-04-09 MED ORDER — CYCLOBENZAPRINE HCL 10 MG PO TABS: 10.0000 mg | ORAL_TABLET | Freq: Three times a day (TID) | ORAL | 0 refills | Status: DC | PRN

## 2017-04-09 NOTE — ED Triage Notes (Signed)
Pt presents w/ c/o shoulder pain x 3 days. Pt seen in [facility] yesterday for same complaint and treated acutely. Pt states he last took ibuprofen and tylenol w/i past 2 hrs w/o relief. Pt denies recent pain but states he has had intermittent pain x 2-3 years.

## 2017-04-09 NOTE — ED Notes (Signed)
Pt called someone in the middle of discharge. Continued to talk on phone and unable to give all directions. When asked to sign he laid phone on bed and signed with person still on phone. Pt has no questions at this time on discharge.

## 2017-04-10 NOTE — ED Provider Notes (Signed)
[facility] Emergency Department Provider Note ____________________________________________  Time seen: Approximately 12:22 AM  I have reviewed the triage vital signs and the nursing notes.   HISTORY  Chief Complaint Shoulder Pain    HPI Devontay Celaya [name] is a 72 y.o. male who presents to the emergency department for evaluation and treatment of right shoulder pain.  Patient has been evaluated multiple times for the same complaint over the past few days.  He denies any change in his symptoms or new injury today.  Patient states that he has taken ibuprofen without relief.  Past Medical History:  Diagnosis Date  . Acute prostatitis   . BPH (benign prostatic hyperplasia)   . Chronic kidney disease   . Dysplasia of prostate   . Erectile dysfunction   . HTN (hypertension)   . Hypogonadism in male   . Orchitis and epididymitis   . Over weight   . Palindromic rheumatism, hand   . Prostatitis   . Rectum pain   . Testicle swelling   . Testicle tenderness   . Testicular mass     Patient Active Problem List   Diagnosis Date Noted  . Sepsis ([place]) 07/19/2015  . HTN (hypertension) 07/19/2015  . Diabetes mellitus ([place]) 07/19/2015  . Hypothyroidism 07/19/2015  . Peripheral neuropathy 07/19/2015  . Chronic back pain 07/19/2015  . CKD (chronic kidney disease) stage 3, GFR 30-59 ml/min ([facility]) 07/19/2015  . Enlarged prostate 03/25/2015  . Urine stream spraying 03/25/2015  . BPH with obstruction/lower urinary tract symptoms 09/18/2014  . Other male erectile dysfunction 09/18/2014    Past Surgical History:  Procedure Laterality Date  . BACK SURGERY    . head surgery     from fall  drilled hole in brain to relieve pressure    Prior to Admission medications   Medication Sig Start Date End Date Taking? Authorizing Provider  amLODipine (NORVASC) 10 MG tablet Take by mouth.    [provider]  aspirin 325 MG tablet Take 325 mg by mouth daily.      [provider]  carvedilol (COREG) 25 MG tablet Take 25 mg by mouth. 10/28/15   [provider]  ciprofloxacin (CIPRO) 250 MG tablet Take 250 mg by mouth 2 (two) times daily.    [provider]  cyclobenzaprine (FLEXERIL) 10 MG tablet Take 1 tablet (10 mg total) by mouth 3 (three) times daily as needed for muscle spasms. 04/09/17   Aveline Daus B, FNP  gabapentin (NEURONTIN) 300 MG capsule Take 1 capsule by mouth at bedtime.  06/30/15   [provider]  LEVOTHYROXINE SODIUM PO Take by mouth.    [provider]  lisinopril-hydrochlorothiazide (PRINZIDE,ZESTORETIC) 20-12.5 MG per tablet Take 1 tablet by mouth daily.    [provider]  metFORMIN (GLUCOPHAGE) 1000 MG tablet Take 1,000 mg by mouth.    [provider]  methocarbamol (ROBAXIN) 750 MG tablet Take 750 mg by mouth every 8 (eight) hours as needed. Reported on 06/02/2015 04/15/15   [provider]  metoprolol succinate (TOPROL-XL) 100 MG 24 hr tablet Take 100 mg by mouth daily. Take with or immediately following a meal.    [provider]  rosuvastatin (CRESTOR) 10 MG tablet 1 tablet daily. 08/19/15   [provider]  sildenafil (VIAGRA) 100 MG tablet Take 100 mg by mouth daily as needed for erectile dysfunction.    [provider]  spironolactone (ALDACTONE) 100 MG tablet Take 100 mg by mouth daily.  [provider]  traMADol (ULTRAM) 50 MG tablet Take 1 tablet (50 mg total) by mouth 3 (three) times daily. Reported on 06/02/2015 04/04/17   [name], PA-C  vardenafil (LEVITRA) 20 MG tablet Take 20 mg by mouth daily as needed for erectile dysfunction. Reported on 06/02/2015    [provider]    Allergies Enalapril maleate  Family History  Problem Relation Age of Onset  . Cancer Maternal Aunt   . Prostate cancer Neg Hx   . Kidney disease Neg Hx   . Kidney cancer Neg Hx   . Bladder Cancer Neg Hx     Social  History Social History   Tobacco Use  . Smoking status: Former Research scientist (life sciences)  . Smokeless tobacco: Never Used  . Tobacco comment: quit 30 years ago  Substance Use Topics  . Alcohol use: No    Alcohol/week: 0.0 oz  . Drug use: No    Review of Systems Constitutional: Negative for fever. Cardiovascular: Negative for chest pain. Respiratory: Negative for shortness of breath. Musculoskeletal: Positive for right shoulder pain Skin: Negative for rash, lesion, or wound Neurological: Negative for decrease in sensation  ____________________________________________   PHYSICAL EXAM:  VITAL SIGNS: ED Triage Vitals  Enc Vitals Group     BP 04/09/17 1841 (!) 199/99     Pulse Rate 04/09/17 1841 60     Resp 04/09/17 1841 19     Temp 04/09/17 1841 (!) 97.4 F (36.3 C)     Temp Source 04/09/17 1841 Oral     SpO2 04/09/17 1841 99 %     Weight 04/09/17 1843 235 lb (106.6 kg)     Height 04/09/17 1843 6\' 1"  (1.854 m)     Head Circumference --      Peak Flow --      Pain Score 04/09/17 1853 10     Pain Loc --      Pain Edu? --      Excl. in [place]? --     Constitutional: Alert and oriented. Well appearing and in no acute distress. Eyes: Conjunctivae are clear without discharge or drainage Head: Atraumatic Neck: Supple Respiratory: No cough. Respirations are even and unlabored. Musculoskeletal: Limited abduction of the right shoulder secondary to pain. Neurologic: Motor and sensory function is intact, specifically of the right upper extremity. Skin: No rash, lesion, or wound is noted on exposed skin surfaces. Psychiatric: Affect and behavior are appropriate.  ____________________________________________   LABS (all labs ordered are listed, but only abnormal results are displayed)  Labs Reviewed - No data to display ____________________________________________  RADIOLOGY  Not  indicated ____________________________________________   PROCEDURES  Procedures  ____________________________________________   INITIAL IMPRESSION / ASSESSMENT AND PLAN / ED COURSE  Maddoxx Burkitt [name] is a 72 y.o. who presents to the emergency department for treatment of chronic right shoulder pain.  Patient was advised that the emergency department does not treat chronic pain with narcotic medications.  Initially, he refused an injection of Toradol but was advised that he would not receive any other injections and then accepted it.  He was given Flexeril as well.  He was strongly advised to call Monday morning and schedule an appointment with his orthopedist.  [facility] controlled substance database was reviewed and the patient will not receive any type of controlled substance tonight.  Medications  cyclobenzaprine (FLEXERIL) tablet 10 mg (10 mg Oral Given 04/09/17 1948)  ketorolac (TORADOL) 30 MG/ML injection 30 mg (30 mg Intramuscular Given 04/09/17 1949)  Pertinent labs & imaging results that were available during my care of the patient were reviewed by me and considered in my medical decision making (see chart for details).  _________________________________________   FINAL CLINICAL IMPRESSION(S) / ED DIAGNOSES  Final diagnoses:  Chronic right shoulder pain  Hypertension, unspecified type    ED Discharge Orders        Ordered    cyclobenzaprine (FLEXERIL) 10 MG tablet  3 times daily PRN     04/09/17 1938       If controlled substance prescribed during this visit, 12 month history viewed on the [facility] prior to issuing an initial prescription for Schedule II or III opiod.    Victorino Dike, FNP 04/10/17 4287    Carrie Mew, MD 04/10/17 2135

## 2017-04-12 NOTE — Progress Notes (Addendum)
04/13/2017 2:52 PM   Paul Ramus [name] 04/27/1945 [id number]  Referring provider: Cletis Athens, MD [address] [place], [place] [address]  No chief complaint on file.   HPI: Patient is a 72 year old African-American male with erectile dysfunction, BPH with lower urinary tract symptoms and a low libido who presents today requesting a follow-up.  Low libido "I need those pellets."   He states he has noted a decrease in his sex drive over the last few months.  He had been on Testopel in the past.  His last insertion was on 11/19/2013.  His most recent testosterone level was 480  ng/dL in March 08, 2016.     Erectile dysfunction The patient is currently on Viagra.  He does not take nitrates.  He takes his occasionally.  It works well for him.  His only complaint is that he sometimes has discomfort with ejaculation.  BPH with LU TS His I PSS score is 10/3.  His PVR is 0 mL.  His major complaint is nocturia x 2.  Previous IPSS was 8/1.  He is overall pleased with his urinary status He will not take the tamsulosin and finasteride due to sexual side effects. His UA is negative.   IPSS    Row Name 04/13/17 1400         International Prostate Symptom Score   How often have you had the sensation of not emptying your bladder?  Not at All     How often have you had to urinate less than every two hours?  Less than 1 in 5 times     How often have you found you stopped and started again several times when you urinated?  Less than half the time     How often have you found it difficult to postpone urination?  Less than 1 in 5 times     How often have you had a weak urinary stream?  About half the time     How often have you had to strain to start urination?  Less than half the time     How many times did you typically get up at night to urinate?  1 Time     Total IPSS Score  10       Quality of Life due to urinary symptoms   If you were to spend the rest of your life with your  urinary condition just the way it is now how would you feel about that?  Mixed        Score:  1-7 Mild 8-19 Moderate 20-35 Severe  Pelvic floor dysfunction The patient has seen pelvic floor physical therapy for his pelvic floor dysfunction.  He has had some discomfort with ejaculation and urination for which she was seen pelvic floor PT.  He stopped this treatment to work on PT for his rotator cuff.     PMH: Past Medical History:  Diagnosis Date  . Acute prostatitis   . BPH (benign prostatic hyperplasia)   . Chronic kidney disease   . Dysplasia of prostate   . Erectile dysfunction   . HTN (hypertension)   . Hypogonadism in male   . Orchitis and epididymitis   . Over weight   . Palindromic rheumatism, hand   . Prostatitis   . Rectum pain   . Testicle swelling   . Testicle tenderness   . Testicular mass     Surgical History: Past Surgical History:  Procedure Laterality Date  . BACK SURGERY    .  head surgery     from fall  drilled hole in brain to relieve pressure    Home Medications:  Allergies as of 04/13/2017      Reactions   Enalapril Maleate Other (See Comments)      Medication List        Accurate as of 04/13/17  2:52 PM. Always use your most recent med list.          amLODipine 10 MG tablet Commonly known as:  NORVASC Take by mouth.   aspirin 325 MG tablet Take 325 mg by mouth daily.   carvedilol 25 MG tablet Commonly known as:  COREG Take 25 mg by mouth.   ciprofloxacin 250 MG tablet Commonly known as:  CIPRO Take 250 mg by mouth 2 (two) times daily.   cyclobenzaprine 10 MG tablet Commonly known as:  FLEXERIL Take 1 tablet (10 mg total) by mouth 3 (three) times daily as needed for muscle spasms.   gabapentin 300 MG capsule Commonly known as:  NEURONTIN Take 1 capsule by mouth at bedtime.   LEVOTHYROXINE SODIUM PO Take by mouth.   lisinopril-hydrochlorothiazide 20-12.5 MG tablet Commonly known as:  PRINZIDE,ZESTORETIC Take 1 tablet  by mouth daily.   metFORMIN 1000 MG tablet Commonly known as:  GLUCOPHAGE Take 1,000 mg by mouth.   methocarbamol 750 MG tablet Commonly known as:  ROBAXIN Take 750 mg by mouth every 8 (eight) hours as needed. Reported on 06/02/2015   metoprolol succinate 100 MG 24 hr tablet Commonly known as:  TOPROL-XL Take 100 mg by mouth daily. Take with or immediately following a meal.   rosuvastatin 10 MG tablet Commonly known as:  CRESTOR 1 tablet daily.   sildenafil 100 MG tablet Commonly known as:  VIAGRA Take 100 mg by mouth daily as needed for erectile dysfunction.   spironolactone 100 MG tablet Commonly known as:  ALDACTONE Take 100 mg by mouth daily.   traMADol 50 MG tablet Commonly known as:  ULTRAM Take 1 tablet (50 mg total) by mouth 3 (three) times daily. Reported on 06/02/2015   vardenafil 20 MG tablet Commonly known as:  LEVITRA Take 20 mg by mouth daily as needed for erectile dysfunction. Reported on 06/02/2015       Allergies:  Allergies  Allergen Reactions  . Enalapril Maleate Other (See Comments)    Family History: Family History  Problem Relation Age of Onset  . Cancer Maternal Aunt   . Prostate cancer Neg Hx   . Kidney disease Neg Hx   . Kidney cancer Neg Hx   . Bladder Cancer Neg Hx     Social History:  reports that he has quit smoking. he has never used smokeless tobacco. He reports that he does not drink alcohol or use drugs.  ROS: UROLOGY Frequent Urination?: No Hard to postpone urination?: No Burning/pain with urination?: No Get up at night to urinate?: No Leakage of urine?: No Urine stream starts and stops?: No Trouble starting stream?: No Do you have to strain to urinate?: No Blood in urine?: No Urinary tract infection?: No Sexually transmitted disease?: No Injury to kidneys or bladder?: No Painful intercourse?: No Weak stream?: No Erection problems?: No Penile pain?: No  Gastrointestinal Nausea?: No Vomiting?:  No Indigestion/heartburn?: No Diarrhea?: No Constipation?: No  Constitutional Fever: No Night sweats?: No Weight loss?: No Fatigue?: No  Skin Skin rash/lesions?: No Itching?: No  Eyes Blurred vision?: No Double vision?: No  Ears/Nose/Throat Sore throat?: No Sinus problems?: No  Hematologic/Lymphatic Swollen  glands?: No Easy bruising?: No  Cardiovascular Leg swelling?: No Chest pain?: No  Respiratory Cough?: No Shortness of breath?: No  Endocrine Excessive thirst?: No  Musculoskeletal Back pain?: No Joint pain?: No  Neurological Headaches?: No Dizziness?: No  Psychologic Depression?: No Anxiety?: No  Physical Exam: BP (!) 168/95   Pulse (!) 59   Resp 16   Ht 6\' 1"  (1.854 m)   Wt 234 lb (106.1 kg)   SpO2 98%   BMI 30.87 kg/m   Constitutional: Well nourished. Alert and oriented, No acute distress. HEENT: [place] AT, moist mucus membranes. Trachea midline, no masses. Cardiovascular: No clubbing, cyanosis, or edema. Respiratory: Normal respiratory effort, no increased work of breathing. GI: Abdomen is soft, non tender, non distended, no abdominal masses. Liver and spleen not palpable.  No hernias appreciated.  Stool sample for occult testing is not indicated.   GU: No CVA tenderness.  No bladder fullness or masses.  Patient with circumcised phallus.  Urethral meatus is patent.  No penile discharge. No penile lesions or rashes. Scrotum without lesions, cysts, rashes and/or edema.  Testicles are located scrotally bilaterally. No masses are appreciated in the testicles. Left and right epididymis are normal. Rectal: Patient with  normal sphincter tone. Anus and perineum without scarring or rashes. No rectal masses are appreciated. Prostate is approximately 60 grams, no nodules are appreciated. Seminal vesicles are normal. Skin: No rashes, bruises or suspicious lesions. Lymph: No cervical or inguinal adenopathy. Neurologic: Grossly intact, no focal deficits,  moving all 4 extremities. Psychiatric: Normal mood and affect.  Laboratory Data: Lab Results  Component Value Date   WBC 12.9 (H) 10/22/2015   HGB 11.6 (L) 10/22/2015   HCT 34.7 (L) 10/22/2015   MCV 84.5 10/22/2015   PLT 155 10/22/2015    Lab Results  Component Value Date   CREATININE 1.25 (H) 10/22/2015    No results found for: PSA  Lab Results  Component Value Date   TESTOSTERONE 480 03/08/2016    No results found for: HGBA1C  Urinalysis Negative.  See Epic.   I have reviewed the labs.  Pertinent imaging Results for THERMON, ZULAUF [name] (MRN [id number]) as of 04/13/2017 15:06  Ref. Range 04/13/2017 14:40  Scan Result Unknown 99ml   Assessment & Plan:    1. Testosterone deficiency Patient had been on Testopel in the past we will check a testosterone level at this time, if it returns below normal we will need to repeat the level before 10 AM to verify testosterone deficiency prior to pursuing therapy  2. BPH with LU TS I PSS score is 10/3, it is worsening  He does not desire any treatment at this time as there are side effects of erectile dysfunction PSA is drawn today as patient is interested in pursuing testosterone therapy   3. Erectile dysfunction Continue Viagra as needed  4. Pelvic floor dysfunction Not bothersome at this time  Return for pending labs.  Zara Council, [facility] [address] [place], [place] [address] [phone]

## 2017-04-13 ENCOUNTER — Ambulatory Visit (INDEPENDENT_AMBULATORY_CARE_PROVIDER_SITE_OTHER): Payer: Medicare HMO | Admitting: Urology

## 2017-04-13 ENCOUNTER — Encounter: Payer: Self-pay | Admitting: Urology

## 2017-04-13 VITALS — BP 168/95 | HR 59 | Resp 16 | Ht 73.0 in | Wt 234.0 lb

## 2017-04-13 DIAGNOSIS — N138 Other obstructive and reflux uropathy: Secondary | ICD-10-CM

## 2017-04-13 DIAGNOSIS — E349 Endocrine disorder, unspecified: Secondary | ICD-10-CM

## 2017-04-13 DIAGNOSIS — M6289 Other specified disorders of muscle: Secondary | ICD-10-CM

## 2017-04-13 DIAGNOSIS — N529 Male erectile dysfunction, unspecified: Secondary | ICD-10-CM

## 2017-04-13 DIAGNOSIS — N401 Enlarged prostate with lower urinary tract symptoms: Secondary | ICD-10-CM | POA: Diagnosis not present

## 2017-04-13 LAB — MICROSCOPIC EXAMINATION
Bacteria, UA: NONE SEEN
EPITHELIAL CELLS (NON RENAL): NONE SEEN /HPF (ref 0–10)
RBC, UA: NONE SEEN /hpf (ref 0–?)
WBC, UA: NONE SEEN /hpf (ref 0–?)

## 2017-04-13 LAB — URINALYSIS, COMPLETE
BILIRUBIN UA: NEGATIVE
Glucose, UA: NEGATIVE
Ketones, UA: NEGATIVE
Leukocytes, UA: NEGATIVE
Nitrite, UA: NEGATIVE
PH UA: 7 (ref 5.0–7.5)
RBC, UA: NEGATIVE
Specific Gravity, UA: 1.02 (ref 1.005–1.030)
UUROB: 1 mg/dL (ref 0.2–1.0)

## 2017-04-13 LAB — BLADDER SCAN AMB NON-IMAGING

## 2017-04-14 LAB — TESTOSTERONE: Testosterone: 295 ng/dL (ref 264–916)

## 2017-04-14 LAB — PSA: PROSTATE SPECIFIC AG, SERUM: 0.5 ng/mL (ref 0.0–4.0)

## 2017-04-18 ENCOUNTER — Telehealth: Payer: Self-pay

## 2017-04-18 DIAGNOSIS — E349 Endocrine disorder, unspecified: Secondary | ICD-10-CM

## 2017-04-18 NOTE — Telephone Encounter (Signed)
Spoke with pt in reference to lab results and needing another lab draw. Pt voiced understanding. Lab appt made and orders placed.

## 2017-04-18 NOTE — Telephone Encounter (Signed)
-----   Message from [name], PA-C sent at 04/17/2017 11:02 PM EDT ----- Please let Mr. Delduca know that his testosterone is low normal.  We need to draw a morning level testosterone before 10 am to confirm.

## 2017-04-19 ENCOUNTER — Other Ambulatory Visit: Payer: Medicare HMO

## 2017-04-19 DIAGNOSIS — E349 Endocrine disorder, unspecified: Secondary | ICD-10-CM | POA: Diagnosis not present

## 2017-04-20 ENCOUNTER — Telehealth: Payer: Self-pay

## 2017-04-20 LAB — TESTOSTERONE: Testosterone: 429 ng/dL (ref 264–916)

## 2017-04-20 NOTE — Telephone Encounter (Signed)
lmom 

## 2017-04-20 NOTE — Telephone Encounter (Signed)
Per Shannon's message patient was notified levels were normal  Notes recorded by [name], PA-C on 04/20/2017 at 7:39 AM EDT Please let Paul Briggs know that his testosterone is normal. He does not need the pellets.

## 2017-04-26 NOTE — Progress Notes (Deleted)
04/27/2017 9:54 PM   Paul Briggs. 1945/11/06 [id number]  Referring provider: Cletis Athens, MD [address] [place], [place] [address]  No chief complaint on file.   HPI: Patient is a 72 year old African-American male with erectile dysfunction, BPH with lower urinary tract symptoms and a low libido who presents today requesting a follow-up.  Low libido "I need those pellets."   He states he has noted a decrease in his sex drive over the last few months.  He had been on Testopel in the past.  His last insertion was on 11/19/2013.  His most recent testosterone level was 480  ng/dL in March 08, 2016.     Erectile dysfunction The patient is currently on Viagra.  He does not take nitrates.  He takes his occasionally.  It works well for him.  His only complaint is that he sometimes has discomfort with ejaculation.  BPH with LU TS His I PSS score is 10/3.  His PVR is 0 mL.  His major complaint is nocturia x 2.  Previous IPSS was 8/1.  He is overall pleased with his urinary status He will not take the tamsulosin and finasteride due to sexual side effects. His UA is negative.   IPSS    Row Name 04/13/17 1400         International Prostate Symptom Score   How often have you had the sensation of not emptying your bladder?  Not at All     How often have you had to urinate less than every two hours?  Less than 1 in 5 times     How often have you found you stopped and started again several times when you urinated?  Less than half the time     How often have you found it difficult to postpone urination?  Less than 1 in 5 times     How often have you had a weak urinary stream?  About half the time     How often have you had to strain to start urination?  Less than half the time     How many times did you typically get up at night to urinate?  1 Time     Total IPSS Score  10       Quality of Life due to urinary symptoms   If you were to spend the rest of your life with your  urinary condition just the way it is now how would you feel about that?  Mixed        Score:  1-7 Mild 8-19 Moderate 20-35 Severe  Pelvic floor dysfunction The patient has seen pelvic floor physical therapy for his pelvic floor dysfunction.  He has had some discomfort with ejaculation and urination for which she was seen pelvic floor PT.  He stopped this treatment to work on PT for his rotator cuff.     PMH: Past Medical History:  Diagnosis Date  . Acute prostatitis   . BPH (benign prostatic hyperplasia)   . Chronic kidney disease   . Dysplasia of prostate   . Erectile dysfunction   . HTN (hypertension)   . Hypogonadism in male   . Orchitis and epididymitis   . Over weight   . Palindromic rheumatism, hand   . Prostatitis   . Rectum pain   . Testicle swelling   . Testicle tenderness   . Testicular mass     Surgical History: Past Surgical History:  Procedure Laterality Date  . BACK SURGERY    .  head surgery     from fall  drilled hole in brain to relieve pressure    Home Medications:  Allergies as of 04/27/2017      Reactions   Enalapril Maleate Other (See Comments)      Medication List        Accurate as of 04/26/17  9:54 PM. Always use your most recent med list.          amLODipine 10 MG tablet Commonly known as:  NORVASC Take by mouth.   aspirin 325 MG tablet Take 325 mg by mouth daily.   carvedilol 25 MG tablet Commonly known as:  COREG Take 25 mg by mouth.   ciprofloxacin 250 MG tablet Commonly known as:  CIPRO Take 250 mg by mouth 2 (two) times daily.   cyclobenzaprine 10 MG tablet Commonly known as:  FLEXERIL Take 1 tablet (10 mg total) by mouth 3 (three) times daily as needed for muscle spasms.   gabapentin 300 MG capsule Commonly known as:  NEURONTIN Take 1 capsule by mouth at bedtime.   LEVOTHYROXINE SODIUM PO Take by mouth.   lisinopril-hydrochlorothiazide 20-12.5 MG tablet Commonly known as:  PRINZIDE,ZESTORETIC Take 1 tablet by  mouth daily.   metFORMIN 1000 MG tablet Commonly known as:  GLUCOPHAGE Take 1,000 mg by mouth.   methocarbamol 750 MG tablet Commonly known as:  ROBAXIN Take 750 mg by mouth every 8 (eight) hours as needed. Reported on 06/02/2015   metoprolol succinate 100 MG 24 hr tablet Commonly known as:  TOPROL-XL Take 100 mg by mouth daily. Take with or immediately following a meal.   rosuvastatin 10 MG tablet Commonly known as:  CRESTOR 1 tablet daily.   sildenafil 100 MG tablet Commonly known as:  VIAGRA Take 100 mg by mouth daily as needed for erectile dysfunction.   spironolactone 100 MG tablet Commonly known as:  ALDACTONE Take 100 mg by mouth daily.   traMADol 50 MG tablet Commonly known as:  ULTRAM Take 1 tablet (50 mg total) by mouth 3 (three) times daily. Reported on 06/02/2015   vardenafil 20 MG tablet Commonly known as:  LEVITRA Take 20 mg by mouth daily as needed for erectile dysfunction. Reported on 06/02/2015       Allergies:  Allergies  Allergen Reactions  . Enalapril Maleate Other (See Comments)    Family History: Family History  Problem Relation Age of Onset  . Cancer Maternal Aunt   . Prostate cancer Neg Hx   . Kidney disease Neg Hx   . Kidney cancer Neg Hx   . Bladder Cancer Neg Hx     Social History:  reports that he has quit smoking. He has never used smokeless tobacco. He reports that he does not drink alcohol or use drugs.  ROS:                                        Physical Exam: There were no vitals taken for this visit.  Constitutional: Well nourished. Alert and oriented, No acute distress. HEENT: [place] AT, moist mucus membranes. Trachea midline, no masses. Cardiovascular: No clubbing, cyanosis, or edema. Respiratory: Normal respiratory effort, no increased work of breathing. GI: Abdomen is soft, non tender, non distended, no abdominal masses. Liver and spleen not palpable.  No hernias appreciated.  Stool sample for  occult testing is not indicated.   GU: No CVA tenderness.  No bladder  fullness or masses.  Patient with circumcised phallus.  Urethral meatus is patent.  No penile discharge. No penile lesions or rashes. Scrotum without lesions, cysts, rashes and/or edema.  Testicles are located scrotally bilaterally. No masses are appreciated in the testicles. Left and right epididymis are normal. Rectal: Patient with  normal sphincter tone. Anus and perineum without scarring or rashes. No rectal masses are appreciated. Prostate is approximately 60 grams, no nodules are appreciated. Seminal vesicles are normal. Skin: No rashes, bruises or suspicious lesions. Lymph: No cervical or inguinal adenopathy. Neurologic: Grossly intact, no focal deficits, moving all 4 extremities. Psychiatric: Normal mood and affect.  Laboratory Data: Lab Results  Component Value Date   WBC 12.9 (H) 10/22/2015   HGB 11.6 (L) 10/22/2015   HCT 34.7 (L) 10/22/2015   MCV 84.5 10/22/2015   PLT 155 10/22/2015    Lab Results  Component Value Date   CREATININE 1.25 (H) 10/22/2015    No results found for: PSA  Lab Results  Component Value Date   TESTOSTERONE 429 04/19/2017    No results found for: HGBA1C  Urinalysis Negative.  See Epic.   I have reviewed the labs.  Pertinent imaging Results for NAZAIR, FORTENBERRY Briggs. (MRN [id number]) as of 04/13/2017 15:06  Ref. Range 04/13/2017 14:40  Scan Result Unknown 87ml   Assessment & Plan:    1. Testosterone deficiency Patient had been on Testopel in the past we will check a testosterone level at this time, if it returns below normal we will need to repeat the level before 10 AM to verify testosterone deficiency prior to pursuing therapy  2. BPH with LU TS I PSS score is 10/3, it is worsening  He does not desire any treatment at this time as there are side effects of erectile dysfunction PSA is drawn today as patient is interested in pursuing testosterone therapy   3.  Erectile dysfunction Continue Viagra as needed  4. Pelvic floor dysfunction Not bothersome at this time  No follow-ups on file.  Zara Council, [facility] [address], [place], [place] [address] [phone]

## 2017-04-27 ENCOUNTER — Ambulatory Visit: Payer: Medicare HMO | Admitting: Urology

## 2017-04-27 ENCOUNTER — Encounter: Payer: Self-pay | Admitting: Urology

## 2017-06-08 DIAGNOSIS — Z79899 Other long term (current) drug therapy: Secondary | ICD-10-CM | POA: Diagnosis not present

## 2017-06-08 DIAGNOSIS — M25519 Pain in unspecified shoulder: Secondary | ICD-10-CM | POA: Diagnosis not present

## 2017-06-15 DIAGNOSIS — M19012 Primary osteoarthritis, left shoulder: Secondary | ICD-10-CM | POA: Diagnosis not present

## 2017-07-20 DIAGNOSIS — M19012 Primary osteoarthritis, left shoulder: Secondary | ICD-10-CM | POA: Diagnosis not present

## 2017-07-20 DIAGNOSIS — Z79899 Other long term (current) drug therapy: Secondary | ICD-10-CM | POA: Diagnosis not present

## 2017-08-31 ENCOUNTER — Ambulatory Visit: Payer: Medicare HMO | Admitting: Urology

## 2017-09-14 DIAGNOSIS — M5416 Radiculopathy, lumbar region: Secondary | ICD-10-CM | POA: Diagnosis not present

## 2017-09-16 DIAGNOSIS — N400 Enlarged prostate without lower urinary tract symptoms: Secondary | ICD-10-CM | POA: Diagnosis not present

## 2017-09-16 DIAGNOSIS — I10 Essential (primary) hypertension: Secondary | ICD-10-CM | POA: Diagnosis not present

## 2017-09-16 DIAGNOSIS — Z87891 Personal history of nicotine dependence: Secondary | ICD-10-CM | POA: Diagnosis not present

## 2017-09-16 DIAGNOSIS — Z7984 Long term (current) use of oral hypoglycemic drugs: Secondary | ICD-10-CM | POA: Diagnosis not present

## 2017-09-16 DIAGNOSIS — S0990XA Unspecified injury of head, initial encounter: Secondary | ICD-10-CM | POA: Diagnosis not present

## 2017-09-16 DIAGNOSIS — E119 Type 2 diabetes mellitus without complications: Secondary | ICD-10-CM | POA: Diagnosis not present

## 2017-09-16 DIAGNOSIS — R51 Headache: Secondary | ICD-10-CM | POA: Diagnosis not present

## 2017-09-16 DIAGNOSIS — Z79899 Other long term (current) drug therapy: Secondary | ICD-10-CM | POA: Diagnosis not present

## 2017-09-16 DIAGNOSIS — W010XXA Fall on same level from slipping, tripping and stumbling without subsequent striking against object, initial encounter: Secondary | ICD-10-CM | POA: Diagnosis not present

## 2017-09-16 DIAGNOSIS — E079 Disorder of thyroid, unspecified: Secondary | ICD-10-CM | POA: Diagnosis not present

## 2017-11-01 DIAGNOSIS — E109 Type 1 diabetes mellitus without complications: Secondary | ICD-10-CM | POA: Diagnosis not present

## 2017-11-01 DIAGNOSIS — M5416 Radiculopathy, lumbar region: Secondary | ICD-10-CM | POA: Diagnosis not present

## 2017-11-04 DIAGNOSIS — K5900 Constipation, unspecified: Secondary | ICD-10-CM | POA: Diagnosis not present

## 2017-11-04 DIAGNOSIS — E11630 Type 2 diabetes mellitus with periodontal disease: Secondary | ICD-10-CM | POA: Diagnosis not present

## 2017-11-04 DIAGNOSIS — E669 Obesity, unspecified: Secondary | ICD-10-CM | POA: Diagnosis not present

## 2017-11-04 DIAGNOSIS — Z6830 Body mass index (BMI) 30.0-30.9, adult: Secondary | ICD-10-CM | POA: Diagnosis not present

## 2017-11-04 DIAGNOSIS — E1140 Type 2 diabetes mellitus with diabetic neuropathy, unspecified: Secondary | ICD-10-CM | POA: Diagnosis not present

## 2017-11-04 DIAGNOSIS — Z7984 Long term (current) use of oral hypoglycemic drugs: Secondary | ICD-10-CM | POA: Diagnosis not present

## 2017-11-04 DIAGNOSIS — E039 Hypothyroidism, unspecified: Secondary | ICD-10-CM | POA: Diagnosis not present

## 2017-11-04 DIAGNOSIS — M2550 Pain in unspecified joint: Secondary | ICD-10-CM | POA: Diagnosis not present

## 2017-11-04 DIAGNOSIS — I10 Essential (primary) hypertension: Secondary | ICD-10-CM | POA: Diagnosis not present

## 2017-11-04 DIAGNOSIS — G8929 Other chronic pain: Secondary | ICD-10-CM | POA: Diagnosis not present

## 2017-11-16 DIAGNOSIS — M5416 Radiculopathy, lumbar region: Secondary | ICD-10-CM | POA: Diagnosis not present

## 2017-11-22 DIAGNOSIS — I10 Essential (primary) hypertension: Secondary | ICD-10-CM | POA: Diagnosis not present

## 2017-11-22 DIAGNOSIS — C61 Malignant neoplasm of prostate: Secondary | ICD-10-CM | POA: Diagnosis not present

## 2017-11-22 DIAGNOSIS — A64 Unspecified sexually transmitted disease: Secondary | ICD-10-CM | POA: Diagnosis not present

## 2017-11-22 DIAGNOSIS — R5381 Other malaise: Secondary | ICD-10-CM | POA: Diagnosis not present

## 2017-11-22 DIAGNOSIS — R69 Illness, unspecified: Secondary | ICD-10-CM | POA: Diagnosis not present

## 2017-11-22 DIAGNOSIS — E119 Type 2 diabetes mellitus without complications: Secondary | ICD-10-CM | POA: Diagnosis not present

## 2018-01-11 DIAGNOSIS — M5416 Radiculopathy, lumbar region: Secondary | ICD-10-CM | POA: Diagnosis not present

## 2018-02-20 ENCOUNTER — Ambulatory Visit: Payer: Medicare HMO | Admitting: Urology

## 2018-02-21 ENCOUNTER — Ambulatory Visit: Payer: Medicare HMO | Admitting: Urology

## 2018-02-21 ENCOUNTER — Encounter: Payer: Self-pay | Admitting: Urology

## 2018-02-21 NOTE — Progress Notes (Incomplete)
04/13/2017 6:48 AM   Paul Ramus [name] 1945-07-07 [id number]  Referring provider: Cletis Athens, MD [address] [place],  [address]  No chief complaint on file.   HPI: Patient is a 73 year old African-American male with erectile dysfunction, BPH with lower urinary tract symptoms and a low libido returns today today for a follow-up.  Low libido "I need those pellets."   He states he has noted a decrease in his sex drive over the last few months.  He had been on Testopel in the past.  His last insertion was on 11/19/2013.  His most recent testosterone level was 429  ng/dL on March 04/19/2017.     Erectile dysfunction The patient is currently on Viagra which he takes occasionally. He does not take nitrates. It works well for him. His only complaint is that he sometimes has discomfort with ejaculation.  BPH with LU TS His I PSS score is ***.  His PVR is *** mL.  His major complaint is nocturia x 2.  Previous IPSS was 10/3.  He is overall pleased with his urinary status He will not take the tamsulosin and finasteride due to sexual side effects. His UA is negative.     Score:  1-7 Mild 8-19 Moderate 20-35 Severe  Pelvic floor dysfunction The patient has seen pelvic floor physical therapy for his pelvic floor dysfunction.  He has had some discomfort with ejaculation and urination for which she was seen pelvic floor PT.  He stopped this treatment to work on PT for his rotator cuff.     PMH: Past Medical History:  Diagnosis Date   Acute prostatitis    BPH (benign prostatic hyperplasia)    Chronic kidney disease    Dysplasia of prostate    Erectile dysfunction    HTN (hypertension)    Hypogonadism in male    Orchitis and epididymitis    Over weight    Palindromic rheumatism, hand    Prostatitis    Rectum pain    Testicle swelling    Testicle tenderness    Testicular mass     Surgical History: Past Surgical History:  Procedure Laterality Date    BACK SURGERY     head surgery     from fall  drilled hole in brain to relieve pressure    Home Medications:  Allergies as of 02/21/2018      Reactions   Enalapril Maleate Other (See Comments)      Medication List       Accurate as of February 21, 2018  6:48 AM. Always use your most recent med list.        amLODipine 10 MG tablet Commonly known as:  NORVASC Take by mouth.   aspirin 325 MG tablet Take 325 mg by mouth daily.   carvedilol 25 MG tablet Commonly known as:  COREG Take 25 mg by mouth.   ciprofloxacin 250 MG tablet Commonly known as:  CIPRO Take 250 mg by mouth 2 (two) times daily.   cyclobenzaprine 10 MG tablet Commonly known as:  FLEXERIL Take 1 tablet (10 mg total) by mouth 3 (three) times daily as needed for muscle spasms.   gabapentin 300 MG capsule Commonly known as:  NEURONTIN Take 1 capsule by mouth at bedtime.   LEVOTHYROXINE SODIUM PO Take by mouth.   lisinopril-hydrochlorothiazide 20-12.5 MG tablet Commonly known as:  PRINZIDE,ZESTORETIC Take 1 tablet by mouth daily.   metFORMIN 1000 MG tablet Commonly known as:  GLUCOPHAGE Take 1,000 mg by mouth.  methocarbamol 750 MG tablet Commonly known as:  ROBAXIN Take 750 mg by mouth every 8 (eight) hours as needed. Reported on 06/02/2015   metoprolol succinate 100 MG 24 hr tablet Commonly known as:  TOPROL-XL Take 100 mg by mouth daily. Take with or immediately following a meal.   rosuvastatin 10 MG tablet Commonly known as:  CRESTOR 1 tablet daily.   sildenafil 100 MG tablet Commonly known as:  VIAGRA Take 100 mg by mouth daily as needed for erectile dysfunction.   spironolactone 100 MG tablet Commonly known as:  ALDACTONE Take 100 mg by mouth daily.   traMADol 50 MG tablet Commonly known as:  ULTRAM Take 1 tablet (50 mg total) by mouth 3 (three) times daily. Reported on 06/02/2015   vardenafil 20 MG tablet Commonly known as:  LEVITRA Take 20 mg by mouth daily as needed for  erectile dysfunction. Reported on 06/02/2015       Allergies:  Allergies  Allergen Reactions   Enalapril Maleate Other (See Comments)    Family History: Family History  Problem Relation Age of Onset   Cancer Maternal Aunt    Prostate cancer Neg Hx    Kidney disease Neg Hx    Kidney cancer Neg Hx    Bladder Cancer Neg Hx     Social History:  reports that he has quit smoking. He has never used smokeless tobacco. He reports that he does not drink alcohol or use drugs.  ROS:                                        Physical Exam: There were no vitals taken for this visit.  Constitutional:  Well nourished. Alert and oriented, No acute distress. HEENT: [place] AT, moist mucus membranes.  Trachea midline, no masses. Cardiovascular: No clubbing, cyanosis, or edema. Respiratory: Normal respiratory effort, no increased work of breathing. GI: Abdomen is soft, non tender, non distended, no abdominal masses. Liver and spleen not palpable.  No hernias appreciated.  Stool sample for occult testing is not indicated.   GU: No CVA tenderness.  No bladder fullness or masses.  Patient with circumcised/uncircumcised phallus. ***Foreskin easily retracted***  Urethral meatus is patent.  No penile discharge. No penile lesions or rashes. Scrotum without lesions, cysts, rashes and/or edema.  Testicles are located scrotally bilaterally. No masses are appreciated in the testicles. Left and right epididymis are normal. Rectal: Patient with  normal sphincter tone. Anus and perineum without scarring or rashes. No rectal masses are appreciated. Prostate is approximately *** grams, *** nodules are appreciated. Seminal vesicles are normal. Skin: No rashes, bruises or suspicious lesions. Lymph: No cervical or inguinal adenopathy. Neurologic: Grossly intact, no focal deficits, moving all 4 extremities. Psychiatric: Normal mood and affect.  Laboratory Data:  Lab Results  Component Value  Date   TESTOSTERONE 429 04/19/2017   Component     Latest Ref Rng & Units 09/18/2014 03/25/2015 02/26/2016 04/13/2017  Prostate Specific Ag, Serum     0.0 - 4.0 ng/mL 0.4 0.4 0.1 0.5    Urinalysis Negative.  See Epic.   I have reviewed the labs.  Pertinent imaging ***  Assessment & Plan:    1. Testosterone deficiency Patient had been on Testopel in the past we will check a testosterone level at this time, if it returns below normal we will need to repeat the level before 10 AM to verify testosterone  deficiency prior to pursuing therapy  2. BPH with LU TS I PSS score is *** previous Is 10/3***, it is worsening  He does not desire any treatment at this time as there are side effects of erectile dysfunction PSA is drawn today as patient is interested in pursuing testosterone therapy   3. Erectile dysfunction Continue Viagra as needed  4. Pelvic floor dysfunction Not bothersome at this time  No follow-ups on file.  [facility] [address], [place] [address], [place] [address] [phone]  I, Lucas Mallow, am acting as a Education administrator for [employer],  {Add [employer] Statement}

## 2018-03-16 NOTE — Progress Notes (Signed)
Error

## 2018-03-17 ENCOUNTER — Ambulatory Visit: Payer: Self-pay | Admitting: Urology

## 2018-03-17 ENCOUNTER — Encounter: Payer: Self-pay | Admitting: Urology

## 2018-03-17 ENCOUNTER — Ambulatory Visit (INDEPENDENT_AMBULATORY_CARE_PROVIDER_SITE_OTHER): Payer: Medicare HMO | Admitting: Urology

## 2018-03-17 VITALS — BP 184/100 | HR 60 | Ht 73.0 in | Wt 244.0 lb

## 2018-03-17 DIAGNOSIS — N401 Enlarged prostate with lower urinary tract symptoms: Secondary | ICD-10-CM

## 2018-03-17 DIAGNOSIS — N138 Other obstructive and reflux uropathy: Secondary | ICD-10-CM

## 2018-03-17 LAB — BLADDER SCAN AMB NON-IMAGING

## 2018-03-17 NOTE — Progress Notes (Signed)
03/17/2018  11:44 AM   [name] May 10, 1945 [id number]  Referring provider: Cletis Athens, MD [address] [employer], [place] [address]  Chief Complaint  Patient presents with  . Benign Prostatic Hypertrophy  . Testosterone Deficiency   HPI: Paul Eustice [name] is a 73 y.o. Black or [place] American male that presents today for evaluation and management of BPH and erectile dysfunction.  Low libido Last testopel insertion on 11/19/2013. Patient was adamant about restarting testopel due to decreased sex drive. His testosterone level remains within the therapeutic range, 429 ng/dL on 04/19/2017.  Not addressed at this visit.  Erectile dysfunction The patient is currently on Viagra which he takes occasionally. He does not take nitrates. It works well for him. His only complaint is soft erections.  He reports right sided testicular pain that radiates to his anus. He notes aggravating factors of erections without ejaculation and frequent intercourse (3-4 times a week). He reports that without intercourse for 3-4 days the pain goes away. He admits that he occasionally strains to ejaculate. Denies erectile curvature. He reports that he rarely experiences spontaneous erections.   SHIM    Row Name 03/17/18 1043         SHIM: Over the last 6 months:   How do you rate your confidence that you could get and keep an erection?  Moderate     When you had erections with sexual stimulation, how often were your erections hard enough for penetration (entering your partner)?  Most Times (much more than half the time)     During sexual intercourse, how often were you able to maintain your erection after you had penetrated (entered) your partner?  Sometimes (about half the time)     When you attempted sexual intercourse, how often was it satisfactory for you?  Most Times (much more than half the time)       SHIM Total Score   SHIM  14       BPH with LU TS His I PSS score is 7,  previous I PSS was 10/3. He is no longer experiencing nocturia x 2. Although his I PSS reflects symptom improvements, his outlook has decreased and he is currently mixed with his urinary status.He will not take the tamsulosin and finasteride due to sexual side effects. His UA is negative.   Patient reports he has been experiencing a split stream for a couple months. He reports that if he squeezes the tip of his penis his stream returns to normal. He also notes stream improvement with sexual intercourse. He admits that this symptom is not bothersome for him. He feels that he is completely emptying his bladder. PVR today is 8 mL.  He reports that when he drinks coffee he experiences occasional leakage with urgency. He further notes urgency with running water, mainly when washing his hands. Denies dysuria and hematuria.   IPSS    Row Name 03/17/18 1000         International Prostate Symptom Score   How often have you had the sensation of not emptying your bladder?  Less than 1 in 5     How often have you had to urinate less than every two hours?  Less than half the time     How often have you found you stopped and started again several times when you urinated?  Less than half the time     How often have you found it difficult to postpone urination?  Less than half  the time     How often have you had a weak urinary stream?  Not at All     How often have you had to strain to start urination?  Not at All     How many times did you typically get up at night to urinate?  None     Total IPSS Score  7       Quality of Life due to urinary symptoms   If you were to spend the rest of your life with your urinary condition just the way it is now how would you feel about that?  Mixed       Pelvic floor dysfunction The patient has seen pelvic floor physical therapy for his pelvic floor dysfunction.  He has had some discomfort with ejaculation and urination for which she was seen pelvic floor PT.  He  stopped this treatment to work on PT for his rotator cuff.    Not addressed at this visit.  PMH: Past Medical History:  Diagnosis Date  . Acute prostatitis   . BPH (benign prostatic hyperplasia)   . Chronic kidney disease   . Dysplasia of prostate   . Erectile dysfunction   . HTN (hypertension)   . Hypogonadism in male   . Orchitis and epididymitis   . Over weight   . Palindromic rheumatism, hand   . Prostatitis   . Rectum pain   . Testicle swelling   . Testicle tenderness   . Testicular mass    Surgical History: Past Surgical History:  Procedure Laterality Date  . BACK SURGERY    . head surgery     from fall  drilled hole in brain to relieve pressure   Home Medications:  Allergies as of 03/17/2018      Reactions   Enalapril Maleate Other (See Comments)      Medication List       Accurate as of March 17, 2018 11:44 AM. Always use your most recent med list.        amLODipine 10 MG tablet Commonly known as:  NORVASC Take by mouth.   aspirin 325 MG tablet Take 325 mg by mouth daily.   carvedilol 25 MG tablet Commonly known as:  COREG Take 25 mg by mouth.   ciprofloxacin 250 MG tablet Commonly known as:  CIPRO Take 250 mg by mouth 2 (two) times daily.   cyclobenzaprine 10 MG tablet Commonly known as:  FLEXERIL Take 1 tablet (10 mg total) by mouth 3 (three) times daily as needed for muscle spasms.   gabapentin 300 MG capsule Commonly known as:  NEURONTIN Take 1 capsule by mouth at bedtime.   LEVOTHYROXINE SODIUM PO Take by mouth.   lisinopril-hydrochlorothiazide 20-12.5 MG tablet Commonly known as:  PRINZIDE,ZESTORETIC Take 1 tablet by mouth daily.   metFORMIN 1000 MG tablet Commonly known as:  GLUCOPHAGE Take 1,000 mg by mouth.   methocarbamol 750 MG tablet Commonly known as:  ROBAXIN Take 750 mg by mouth every 8 (eight) hours as needed. Reported on 06/02/2015   metoprolol succinate 100 MG 24 hr tablet Commonly known as:  TOPROL-XL Take  100 mg by mouth daily. Take with or immediately following a meal.   oxyCODONE-acetaminophen 7.5-325 MG tablet Commonly known as:  PERCOCET   rosuvastatin 10 MG tablet Commonly known as:  CRESTOR 1 tablet daily.   sildenafil 100 MG tablet Commonly known as:  VIAGRA Take 100 mg by mouth daily as needed for erectile dysfunction.   spironolactone  100 MG tablet Commonly known as:  ALDACTONE Take 100 mg by mouth daily.   traMADol 50 MG tablet Commonly known as:  ULTRAM Take 1 tablet (50 mg total) by mouth 3 (three) times daily. Reported on 06/02/2015   vardenafil 20 MG tablet Commonly known as:  LEVITRA Take 20 mg by mouth daily as needed for erectile dysfunction. Reported on 06/02/2015      Allergies:  Allergies  Allergen Reactions  . Enalapril Maleate Other (See Comments)   Family History: Family History  Problem Relation Age of Onset  . Cancer Maternal Aunt   . Prostate cancer Neg Hx   . Kidney disease Neg Hx   . Kidney cancer Neg Hx   . Bladder Cancer Neg Hx     Social History:  reports that he has quit smoking. He has never used smokeless tobacco. He reports that he does not drink alcohol or use drugs.  ROS: UROLOGY Frequent Urination?: No Hard to postpone urination?: No Burning/pain with urination?: No Get up at night to urinate?: No Leakage of urine?: No Urine stream starts and stops?: No Trouble starting stream?: No Do you have to strain to urinate?: No Blood in urine?: No Urinary tract infection?: No Sexually transmitted disease?: No Injury to kidneys or bladder?: No Painful intercourse?: No Weak stream?: No Erection problems?: No Penile pain?: No  Gastrointestinal Nausea?: No Vomiting?: No Indigestion/heartburn?: No Diarrhea?: No Constipation?: No  Constitutional Fever: No Night sweats?: No Weight loss?: No Fatigue?: No  Skin Skin rash/lesions?: No Itching?: No  Eyes Blurred vision?: No Double vision?: No  Ears/Nose/Throat Sore  throat?: No Sinus problems?: No  Hematologic/Lymphatic Swollen glands?: No Easy bruising?: No  Cardiovascular Leg swelling?: No Chest pain?: No  Respiratory Cough?: No Shortness of breath?: No  Endocrine Excessive thirst?: No  Musculoskeletal Back pain?: Yes Joint pain?: No  Neurological Headaches?: No Dizziness?: No  Psychologic Depression?: No Anxiety?: No  Physical Exam: BP (!) 184/100 (BP Location: Left Arm, Patient Position: Sitting)   Pulse 60   Ht 6\' 1"  (1.854 m)   Wt 244 lb (110.7 kg)   BMI 32.19 kg/m Constitutional:  Alert and oriented, No acute distress. Respiratory: Normal respiratory effort, no increased work of breathing. Head: Normocephalic and Atraumatic. GU: No CVA tenderness.  No bladder fullness or masses.  Patient with circumcised phallus. Urethral meatus is patent.  No penile discharge. No penile lesions or rashes. Scrotum without lesions, cysts, rashes and/or edema.  Testicles are located scrotally bilaterally. No masses are appreciated in the testicles. Left and right epididymis are normal. Rectal: Patient with  normal sphincter tone. Anus and perineum without scarring or rashes. No rectal masses are appreciated. Prostate is approximately 60 grams, no nodules are appreciated.Could only palpate the apex. GU: No CVA tenderness Skin: No rashes, bruises or suspicious lesions. Neurologic: Grossly intact, no focal deficits, moving all 4 extremities. Psychiatric: Normal mood and affect.  Laboratory Data: Lab Results  Component Value Date   TESTOSTERONE 429 04/19/2017   Results for orders placed or performed in visit on 03/17/18  BLADDER SCAN AMB NON-IMAGING  Result Value Ref Range   Scan Result 52ml    Assessment & Plan:   1. BPH with LUTS  - His IPSS today is 7, which is mild LUTS. He is currently mixed about his quality of life.  - Patient reports increased urgency, testicular pain, and split stream. Based on his PVR today (73mL), is likely  associated with the size of his prostate  -  Discussed that any medical interventions for his symptoms at this time have a probability of affecting his sex life. Patient feels that his symptoms are not bothersome enough at this time for medical intervention.  - Continue conservative management  2. Erectile Dysfunction  - Patient reports soft erections that he reports improves greatly with Viagra  - Continue Viagra usage as needed  Return in about 1 year (around 03/18/2019) for IPSS, SHIM and exam.  Zara Council, PA-C [facility] [address], [place], [place] [address] [phone]  I, Temidayo Atanda-Ogunleye , am acting as a scribe for [facility], PA-C  I have reviewed the above documentation for accuracy and completeness, and I agree with the above.    Zara Council, PA-C

## 2018-04-10 DIAGNOSIS — M5416 Radiculopathy, lumbar region: Secondary | ICD-10-CM | POA: Diagnosis not present

## 2018-05-11 DIAGNOSIS — M5416 Radiculopathy, lumbar region: Secondary | ICD-10-CM | POA: Diagnosis not present

## 2018-05-12 DIAGNOSIS — M5416 Radiculopathy, lumbar region: Secondary | ICD-10-CM | POA: Diagnosis not present

## 2018-06-20 DIAGNOSIS — M5136 Other intervertebral disc degeneration, lumbar region: Secondary | ICD-10-CM | POA: Diagnosis not present

## 2018-06-20 DIAGNOSIS — M48061 Spinal stenosis, lumbar region without neurogenic claudication: Secondary | ICD-10-CM | POA: Diagnosis not present

## 2018-06-23 ENCOUNTER — Other Ambulatory Visit: Payer: Self-pay | Admitting: Orthopedic Surgery

## 2018-06-23 ENCOUNTER — Other Ambulatory Visit (HOSPITAL_COMMUNITY): Payer: Self-pay | Admitting: Orthopedic Surgery

## 2018-06-23 DIAGNOSIS — M48061 Spinal stenosis, lumbar region without neurogenic claudication: Secondary | ICD-10-CM

## 2018-07-06 ENCOUNTER — Ambulatory Visit
Admission: RE | Admit: 2018-07-06 | Discharge: 2018-07-06 | Disposition: A | Discharge status: Home / Self Care | Payer: Medicare HMO | Source: Ambulatory Visit | Attending: Orthopedic Surgery | Admitting: Orthopedic Surgery

## 2018-07-06 ENCOUNTER — Other Ambulatory Visit: Payer: Self-pay

## 2018-07-06 DIAGNOSIS — M48061 Spinal stenosis, lumbar region without neurogenic claudication: Secondary | ICD-10-CM | POA: Insufficient documentation

## 2018-07-06 DIAGNOSIS — M545 Low back pain: Secondary | ICD-10-CM | POA: Diagnosis not present

## 2018-07-14 DIAGNOSIS — M48061 Spinal stenosis, lumbar region without neurogenic claudication: Secondary | ICD-10-CM | POA: Diagnosis not present

## 2018-09-13 DIAGNOSIS — Z5181 Encounter for therapeutic drug level monitoring: Secondary | ICD-10-CM | POA: Diagnosis not present

## 2018-09-13 DIAGNOSIS — M25519 Pain in unspecified shoulder: Secondary | ICD-10-CM | POA: Diagnosis not present

## 2018-09-13 DIAGNOSIS — G894 Chronic pain syndrome: Secondary | ICD-10-CM | POA: Diagnosis not present

## 2018-09-13 DIAGNOSIS — M5416 Radiculopathy, lumbar region: Secondary | ICD-10-CM | POA: Diagnosis not present

## 2018-09-13 DIAGNOSIS — Z79899 Other long term (current) drug therapy: Secondary | ICD-10-CM | POA: Diagnosis not present

## 2018-11-01 NOTE — Progress Notes (Deleted)
11/02/2018  1:33 PM   Paul Ramus [name] 1945-12-19 [id number]  Referring provider: Cletis Athens, MD [address] [place],  [place] [address]  No chief complaint on file.  HPI: Paul Winson [name] is a 73 y.o.  male that presents today for evaluation and management of BPH and erectile dysfunction for painful ejaculation.    Low libido Last testopel insertion on 11/19/2013. Patient was adamant about restarting testopel due to decreased sex drive. His testosterone level remains within the therapeutic range, 429 ng/dL on 04/19/2017.  Not addressed at this visit.  Erectile dysfunction The patient is currently on Viagra which he takes occasionally. He does not take nitrates. It works well for him. His only complaint is soft erections.  He reports right sided testicular pain that radiates to his anus. He notes aggravating factors of erections without ejaculation and frequent intercourse (3-4 times a week). He reports that without intercourse for 3-4 days the pain goes away. He admits that he occasionally strains to ejaculate. Denies erectile curvature. He reports that he rarely experiences spontaneous erections.    BPH with LU TS His I PSS score is 7, previous I PSS was 10/3. He is no longer experiencing nocturia x 2. Although his I PSS reflects symptom improvements, his outlook has decreased and he is currently mixed with his urinary status.He will not take the tamsulosin and finasteride due to sexual side effects. His UA is negative.   Patient reports he has been experiencing a split stream for a couple months. He reports that if he squeezes the tip of his penis his stream returns to normal. He also notes stream improvement with sexual intercourse. He admits that this symptom is not bothersome for him. He feels that he is completely emptying his bladder. PVR today is 8 mL.  He reports that when he drinks coffee he experiences occasional leakage with urgency. He further notes  urgency with running water, mainly when washing his hands. Denies dysuria and hematuria.    Pelvic floor dysfunction The patient has seen pelvic floor physical therapy for his pelvic floor dysfunction.  He has had some discomfort with ejaculation and urination for which she was seen pelvic floor PT.  He stopped this treatment to work on PT for his rotator cuff.    Not addressed at this visit.  PMH: Past Medical History:  Diagnosis Date  . Acute prostatitis   . BPH (benign prostatic hyperplasia)   . Chronic kidney disease   . Dysplasia of prostate   . Erectile dysfunction   . HTN (hypertension)   . Hypogonadism in male   . Orchitis and epididymitis   . Over weight   . Palindromic rheumatism, hand   . Prostatitis   . Rectum pain   . Testicle swelling   . Testicle tenderness   . Testicular mass    Surgical History: Past Surgical History:  Procedure Laterality Date  . BACK SURGERY    . head surgery     from fall  drilled hole in brain to relieve pressure   Home Medications:  Allergies as of 11/02/2018      Reactions   Enalapril Maleate Other (See Comments)      Medication List       Accurate as of November 01, 2018  1:33 PM. If you have any questions, ask your nurse or doctor.        amLODipine 10 MG tablet Commonly known as: NORVASC Take by mouth.   aspirin 325 MG tablet  Take 325 mg by mouth daily.   carvedilol 25 MG tablet Commonly known as: COREG Take 25 mg by mouth.   ciprofloxacin 250 MG tablet Commonly known as: CIPRO Take 250 mg by mouth 2 (two) times daily.   cyclobenzaprine 10 MG tablet Commonly known as: FLEXERIL Take 1 tablet (10 mg total) by mouth 3 (three) times daily as needed for muscle spasms.   gabapentin 300 MG capsule Commonly known as: NEURONTIN Take 1 capsule by mouth at bedtime.   LEVOTHYROXINE SODIUM PO Take by mouth.   lisinopril-hydrochlorothiazide 20-12.5 MG tablet Commonly known as: ZESTORETIC Take 1 tablet by mouth  daily.   metFORMIN 1000 MG tablet Commonly known as: GLUCOPHAGE Take 1,000 mg by mouth.   methocarbamol 750 MG tablet Commonly known as: ROBAXIN Take 750 mg by mouth every 8 (eight) hours as needed. Reported on 06/02/2015   metoprolol succinate 100 MG 24 hr tablet Commonly known as: TOPROL-XL Take 100 mg by mouth daily. Take with or immediately following a meal.   oxyCODONE-acetaminophen 7.5-325 MG tablet Commonly known as: PERCOCET   rosuvastatin 10 MG tablet Commonly known as: CRESTOR 1 tablet daily.   sildenafil 100 MG tablet Commonly known as: VIAGRA Take 100 mg by mouth daily as needed for erectile dysfunction.   spironolactone 100 MG tablet Commonly known as: ALDACTONE Take 100 mg by mouth daily.   traMADol 50 MG tablet Commonly known as: ULTRAM Take 1 tablet (50 mg total) by mouth 3 (three) times daily. Reported on 06/02/2015   vardenafil 20 MG tablet Commonly known as: LEVITRA Take 20 mg by mouth daily as needed for erectile dysfunction. Reported on 06/02/2015      Allergies:  Allergies  Allergen Reactions  . Enalapril Maleate Other (See Comments)   Family History: Family History  Problem Relation Age of Onset  . Cancer Maternal Aunt   . Prostate cancer Neg Hx   . Kidney disease Neg Hx   . Kidney cancer Neg Hx   . Bladder Cancer Neg Hx     Social History:  reports that he has quit smoking. He has never used smokeless tobacco. He reports that he does not drink alcohol or use drugs.  ROS:                                        Physical Exam: There were no vitals taken for this visit.  Constitutional:  Well nourished. Alert and oriented, No acute distress. HEENT: [place] AT, moist mucus membranes.  Trachea midline, no masses. Cardiovascular: No clubbing, cyanosis, or edema. Respiratory: Normal respiratory effort, no increased work of breathing. GI: Abdomen is soft, non tender, non distended, no abdominal masses. Liver and spleen not  palpable.  No hernias appreciated.  Stool sample for occult testing is not indicated.   GU: No CVA tenderness.  No bladder fullness or masses.  Patient with circumcised/uncircumcised phallus. ***Foreskin easily retracted***  Urethral meatus is patent.  No penile discharge. No penile lesions or rashes. Scrotum without lesions, cysts, rashes and/or edema.  Testicles are located scrotally bilaterally. No masses are appreciated in the testicles. Left and right epididymis are normal. Rectal: Patient with  normal sphincter tone. Anus and perineum without scarring or rashes. No rectal masses are appreciated. Prostate is approximately *** grams, *** nodules are appreciated. Seminal vesicles are normal. Skin: No rashes, bruises or suspicious lesions. Lymph: No cervical or inguinal adenopathy. Neurologic:  Grossly intact, no focal deficits, moving all 4 extremities. Psychiatric: Normal mood and affect.  Laboratory Data: Lab Results  Component Value Date   TESTOSTERONE 429 04/19/2017   Results for orders placed or performed in visit on 03/17/18  BLADDER SCAN AMB NON-IMAGING  Result Value Ref Range   Scan Result 6ml    Assessment & Plan:   1. BPH with LUTS  - His IPSS today is 7, which is mild LUTS. He is currently mixed about his quality of life.  - Patient reports increased urgency, testicular pain, and split stream. Based on his PVR today (75mL), is likely associated with the size of his prostate  - Discussed that any medical interventions for his symptoms at this time have a probability of affecting his sex life. Patient feels that his symptoms are not bothersome enough at this time for medical intervention.  - Continue conservative management  2. Erectile Dysfunction  - Patient reports soft erections that he reports improves greatly with Viagra  - Continue Viagra usage as needed  No follow-ups on file.  Zara Council, PA-C [facility] [address], [place]  [place], [place] [address] [phone]

## 2018-11-02 ENCOUNTER — Ambulatory Visit: Payer: Medicare Other | Admitting: Urology

## 2018-11-07 ENCOUNTER — Encounter: Payer: Self-pay | Admitting: Urology

## 2018-11-07 ENCOUNTER — Ambulatory Visit (INDEPENDENT_AMBULATORY_CARE_PROVIDER_SITE_OTHER): Payer: Medicare HMO | Admitting: Urology

## 2018-11-07 ENCOUNTER — Other Ambulatory Visit: Payer: Self-pay

## 2018-11-07 VITALS — BP 168/73 | HR 73 | Ht 72.0 in | Wt 231.0 lb

## 2018-11-07 DIAGNOSIS — N400 Enlarged prostate without lower urinary tract symptoms: Secondary | ICD-10-CM | POA: Diagnosis not present

## 2018-11-07 DIAGNOSIS — N401 Enlarged prostate with lower urinary tract symptoms: Secondary | ICD-10-CM | POA: Diagnosis not present

## 2018-11-07 DIAGNOSIS — N138 Other obstructive and reflux uropathy: Secondary | ICD-10-CM

## 2018-11-07 DIAGNOSIS — E349 Endocrine disorder, unspecified: Secondary | ICD-10-CM

## 2018-11-07 LAB — URINALYSIS, COMPLETE
Bilirubin, UA: NEGATIVE
Ketones, UA: NEGATIVE
Leukocytes,UA: NEGATIVE
Nitrite, UA: NEGATIVE
RBC, UA: NEGATIVE
Specific Gravity, UA: 1.025 (ref 1.005–1.030)
Urobilinogen, Ur: 1 mg/dL (ref 0.2–1.0)
pH, UA: 6 (ref 5.0–7.5)

## 2018-11-07 LAB — MICROSCOPIC EXAMINATION
Bacteria, UA: NONE SEEN
RBC: NONE SEEN /hpf (ref 0–2)

## 2018-11-07 MED ORDER — TADALAFIL 5 MG PO TABS: 5.0000 mg | ORAL_TABLET | Freq: Every day | ORAL | 3 refills | Status: DC | PRN

## 2018-11-07 NOTE — Progress Notes (Signed)
11/07/2018  8:04 AM   Paul Ramus [name] Jul 13, 1945 [id number]  Referring provider: Cletis [place], MD [address] [place],  [place] [address]  Chief Complaint  Patient presents with  . Benign Prostatic Hypertrophy   HPI: Paul Rabas [name] is a 73 y.o.  male that presents today for evaluation and management of BPH and erectile dysfunction for painful ejaculation.    Erectile dysfunction His SHIM score is 16, which is mild-moderate ED.   His previous SHIM score was 14.  His major complaint is after having intercourse consecutively for 4 to 5 days on the 6th or 7th day, his erections are not as firm and he has a slight burn with ejaculation.  His libido is preserved.   His risk factors for ED are age, BPH, spinal injury, brain injury, DM, HTN, HLD and sleep apnea.  He denies any painful erections or curvatures with his erections.   He is still having spontaneous erections.  He denies any penile discharge.   SHIM    Row Name 11/07/18 1417         SHIM: Over the last 6 months:   How do you rate your confidence that you could get and keep an erection?  Moderate     When you had erections with sexual stimulation, how often were your erections hard enough for penetration (entering your partner)?  Sometimes (about half the time)     During sexual intercourse, how often were you able to maintain your erection after you had penetrated (entered) your partner?  Sometimes (about half the time)     During sexual intercourse, how difficult was it to maintain your erection to completion of intercourse?  Slightly Difficult     When you attempted sexual intercourse, how often was it satisfactory for you?  Sometimes (about half the time)       SHIM Total Score   SHIM  16        Score: 1-7 Severe ED 8-11 Moderate ED 12-16 Mild-Moderate ED 17-21 Mild ED 22-25 No ED  BPH WITH LUTS  (prostate and/or bladder) IPSS score: 10/4    Previous score: 7/3     Major complaint(s):   Intermittency, straining to urinate, weak urinary stream and a split stream for the last several months.  He will not take tamsulosin or finasteride due to the sexual side effects.  Denies any dysuria, hematuria or suprapubic pain.   UA is negative.  Denies any recent fevers, chills, nausea or vomiting. IPSS    Row Name 11/07/18 1400         International Prostate Symptom Score   How often have you had the sensation of not emptying your bladder?  Not at All     How often have you had to urinate less than every two hours?  Less than half the time     How often have you found you stopped and started again several times when you urinated?  Less than half the time     How often have you found it difficult to postpone urination?  Not at All     How often have you had a weak urinary stream?  More than half the time     How often have you had to strain to start urination?  Less than half the time     How many times did you typically get up at night to urinate?  None     Total IPSS Score  10  Quality of Life due to urinary symptoms   If you were to spend the rest of your life with your urinary condition just the way it is now how would you feel about that?  Mostly Disatisfied        Score:  1-7 Mild 8-19 Moderate 20-35 Severe   PMH: Past Medical History:  Diagnosis Date  . Acute prostatitis   . BPH (benign prostatic hyperplasia)   . Chronic kidney disease   . Dysplasia of prostate   . Erectile dysfunction   . HTN (hypertension)   . Hypogonadism in male   . Orchitis and epididymitis   . Over weight   . Palindromic rheumatism, hand   . Prostatitis   . Rectum pain   . Testicle swelling   . Testicle tenderness   . Testicular mass    Surgical History: Past Surgical History:  Procedure Laterality Date  . BACK SURGERY    . head surgery     from fall  drilled hole in brain to relieve pressure   Home Medications:  Allergies as of 11/07/2018      Reactions   Enalapril  Maleate Other (See Comments)      Medication List       Accurate as of November 07, 2018 11:59 PM. If you have any questions, ask your nurse or doctor.        STOP taking these medications   ciprofloxacin 250 MG tablet Commonly known as: CIPRO Stopped by: Blythe Hartshorn, PA-C   sildenafil 100 MG tablet Commonly known as: VIAGRA Stopped by: Atilano Covelli, PA-C   vardenafil 20 MG tablet Commonly known as: LEVITRA Stopped by: Zara Council, PA-C     TAKE these medications   amLODipine 10 MG tablet Commonly known as: NORVASC Take by mouth.   aspirin 325 MG tablet Take 325 mg by mouth daily.   carvedilol 25 MG tablet Commonly known as: COREG Take 25 mg by mouth.   cyclobenzaprine 10 MG tablet Commonly known as: FLEXERIL Take 1 tablet (10 mg total) by mouth 3 (three) times daily as needed for muscle spasms.   gabapentin 300 MG capsule Commonly known as: NEURONTIN Take 1 capsule by mouth at bedtime.   LEVOTHYROXINE SODIUM PO Take by mouth.   lisinopril-hydrochlorothiazide 20-12.5 MG tablet Commonly known as: ZESTORETIC Take 1 tablet by mouth daily.   metFORMIN 1000 MG tablet Commonly known as: GLUCOPHAGE Take 1,000 mg by mouth.   methocarbamol 750 MG tablet Commonly known as: ROBAXIN Take 750 mg by mouth every 8 (eight) hours as needed. Reported on 06/02/2015   metoprolol succinate 100 MG 24 hr tablet Commonly known as: TOPROL-XL Take 100 mg by mouth daily. Take with or immediately following a meal.   oxyCODONE-acetaminophen 7.5-325 MG tablet Commonly known as: PERCOCET   rosuvastatin 10 MG tablet Commonly known as: CRESTOR 1 tablet daily.   spironolactone 100 MG tablet Commonly known as: ALDACTONE Take 100 mg by mouth daily.   tadalafil 5 MG tablet Commonly known as: CIALIS Take 1 tablet (5 mg total) by mouth daily as needed for erectile dysfunction. Started by: Zara Council, PA-C   traMADol 50 MG tablet Commonly known as: ULTRAM Take 1  tablet (50 mg total) by mouth 3 (three) times daily. Reported on 06/02/2015      Allergies:  Allergies  Allergen Reactions  . Enalapril Maleate Other (See Comments)   Family History: Family History  Problem Relation Age of Onset  . Cancer Maternal Aunt   .  Prostate cancer Neg Hx   . Kidney disease Neg Hx   . Kidney cancer Neg Hx   . Bladder Cancer Neg Hx     Social History:  reports that he has quit smoking. He has never used smokeless tobacco. He reports that he does not drink alcohol or use drugs.  ROS: UROLOGY Frequent Urination?: No Hard to postpone urination?: No Burning/pain with urination?: No Get up at night to urinate?: No Leakage of urine?: No Urine stream starts and stops?: Yes Trouble starting stream?: No Do you have to strain to urinate?: Yes Blood in urine?: No Urinary tract infection?: Yes Sexually transmitted disease?: No Injury to kidneys or bladder?: No Painful intercourse?: Yes Weak stream?: Yes Erection problems?: Yes Penile pain?: Yes  Gastrointestinal Nausea?: No Vomiting?: No Indigestion/heartburn?: No Diarrhea?: No Constipation?: Yes  Constitutional Fever: No Night sweats?: No Weight loss?: No Fatigue?: No  Skin Skin rash/lesions?: No Itching?: No  Eyes Blurred vision?: No Double vision?: No  Ears/Nose/Throat Sore throat?: No Sinus problems?: No  Hematologic/Lymphatic Swollen glands?: No Easy bruising?: No  Cardiovascular Leg swelling?: Yes Chest pain?: No  Respiratory Cough?: No Shortness of breath?: No  Endocrine Excessive thirst?: No  Musculoskeletal Back pain?: Yes Joint pain?: Yes  Neurological Headaches?: No Dizziness?: No  Psychologic Depression?: No Anxiety?: No  Physical Exam: BP (!) 168/73   Pulse 73   Ht 6' (1.829 m)   Wt 231 lb (104.8 kg)   BMI 31.33 kg/m   Constitutional:  Well nourished. Alert and oriented, No acute distress. HEENT: [place] AT, moist mucus membranes.  Trachea midline, no  masses. Cardiovascular: No clubbing, cyanosis, or edema. Respiratory: Normal respiratory effort, no increased work of breathing. GI: Abdomen is soft, non tender, non distended, no abdominal masses. Liver and spleen not palpable.  No hernias appreciated.  Stool sample for occult testing is not indicated.   GU: No CVA tenderness.  No bladder fullness or masses.  Patient with circumcised phallus.  Urethral meatus is patent.  No penile discharge. No penile lesions or rashes. Scrotum without lesions, cysts, rashes and/or edema.  Testicles are located scrotally bilaterally. No masses are appreciated in the testicles. Left and right epididymis are normal. Rectal: Patient with  normal sphincter tone. Anus and perineum without scarring or rashes. No rectal masses are appreciated. Prostate is approximately 60 +grams, could only palpate the apex and midportion of the gland, no nodules are appreciated. Seminal vesicles could not be palpated.   Skin: No rashes, bruises or suspicious lesions. Lymph: No inguinal adenopathy. Neurologic: Grossly intact, no focal deficits, moving all 4 extremities. Psychiatric: Normal mood and affect.  Laboratory Data: Lab Results  Component Value Date   TESTOSTERONE 429 04/19/2017   Results for orders placed or performed in visit on 11/07/18  Microscopic Examination   URINE  Result Value Ref Range   WBC, UA 0-5 0 - 5 /hpf   RBC None seen 0 - 2 /hpf   Epithelial Cells (non renal) 0-10 0 - 10 /hpf   Bacteria, UA None seen None seen/Few  PSA  Result Value Ref Range   Prostate Specific Ag, Serum 0.6 0.0 - 4.0 ng/mL  Urinalysis, Complete  Result Value Ref Range   Specific Gravity, UA 1.025 1.005 - 1.030   pH, UA 6.0 5.0 - 7.5   Color, UA Yellow Yellow   Appearance Ur Clear Clear   Leukocytes,UA Negative Negative   Protein,UA 1+ (A) Negative/Trace   Glucose, UA Trace (A) Negative   Ketones,  UA Negative Negative   RBC, UA Negative Negative   Bilirubin, UA Negative  Negative   Urobilinogen, Ur 1.0 0.2 - 1.0 mg/dL   Nitrite, UA Negative Negative   Microscopic Examination See below:    Assessment & Plan:    1. BPH with LUTS IPSS score is 10/4, it is worsening Continue conservative management, avoiding bladder irritants and timed voiding's Most bothersome symptoms is/are intermittency, straining to urinate, weak stream and a splitting of the stream Initiate Cialis 5 mg daily as patient is not wanting any medication that intefers with sexual function and he is not wanting any bladder outlet procedure for the same reason RTC in one month for I PSS and PVR   2. Erectile dysfunction SHIM score is 16, it is improving I had a discussion with Paul Briggs concerning his expectations with his sexual intercourse.  The discomfort and the diminished erections that he experiences after having 4 to 5 days in a row of satisfactory intercourse is likely due to the straining of his pelvic floor muscles.  I offered a referral physical therapy, but he is declined. He is taking daily Cialis 5 mg for his BPH and it may be helpful for his erection and perineal discomfort    Return in about 1 month (around 12/08/2018) for IPSS and PVR.  Zara Council, PA-C [facility] [address] [place], [place] [address] [phone]

## 2018-11-08 ENCOUNTER — Telehealth: Payer: Self-pay

## 2018-11-08 LAB — PSA: Prostate Specific Ag, Serum: 0.6 ng/mL (ref 0.0–4.0)

## 2018-11-08 NOTE — Telephone Encounter (Signed)
Left message informing patient of lab results. 

## 2018-11-08 NOTE — Telephone Encounter (Signed)
-----   Message from [name], PA-C sent at 11/08/2018  8:19 AM EDT ----- Please let Paul Briggs know that his PSA was normal at 0.6.

## 2018-11-10 DIAGNOSIS — G894 Chronic pain syndrome: Secondary | ICD-10-CM | POA: Diagnosis not present

## 2018-11-10 DIAGNOSIS — M5416 Radiculopathy, lumbar region: Secondary | ICD-10-CM | POA: Diagnosis not present

## 2018-11-10 DIAGNOSIS — M25519 Pain in unspecified shoulder: Secondary | ICD-10-CM | POA: Diagnosis not present

## 2018-11-10 DIAGNOSIS — Z79899 Other long term (current) drug therapy: Secondary | ICD-10-CM | POA: Diagnosis not present

## 2018-11-16 ENCOUNTER — Ambulatory Visit: Payer: Self-pay | Admitting: Urology

## 2018-12-11 NOTE — Progress Notes (Deleted)
12/12/2018  8:35 PM   Paul Ramus [name] 1945/09/07 [id number]  Referring provider: Cletis [place], MD [address] [place],  [place] [address]  No chief complaint on file.  HPI: Paul Soyars [name] is a 73 y.o.  male that presents today for follow up of BPH, erectile dysfunction and painful ejaculation.    Erectile dysfunction His SHIM score is ***, which is *** ED.   His previous SHIM score was 16.  His major complaint is after having intercourse consecutively for 4 to 5 days on the 6th or 7th day, his erections are not as firm and he has a slight burn with ejaculation.  His libido is preserved.   His risk factors for ED are age, BPH, spinal injury, brain injury, DM, HTN, HLD and sleep apnea.  He denies any painful erections or curvatures with his erections.   He is still having spontaneous erections.  He denies any penile discharge.     Score: 1-7 Severe ED 8-11 Moderate ED 12-16 Mild-Moderate ED 17-21 Mild ED 22-25 No ED  BPH WITH LUTS  (prostate and/or bladder) IPSS score: ***    Previous score: 10/4    Major complaint(s):  Intermittency, straining to urinate, weak urinary stream and a split stream for the last several months.  He will not take tamsulosin or finasteride due to the sexual side effects.  Denies any dysuria, hematuria or suprapubic pain.   UA is negative.  Denies any recent fevers, chills, nausea or vomiting.   Score:  1-7 Mild 8-19 Moderate 20-35 Severe   PMH: Past Medical History:  Diagnosis Date  . Acute prostatitis   . BPH (benign prostatic hyperplasia)   . Chronic kidney disease   . Dysplasia of prostate   . Erectile dysfunction   . HTN (hypertension)   . Hypogonadism in male   . Orchitis and epididymitis   . Over weight   . Palindromic rheumatism, hand   . Prostatitis   . Rectum pain   . Testicle swelling   . Testicle tenderness   . Testicular mass    Surgical History: Past Surgical History:  Procedure Laterality Date  .  BACK SURGERY    . head surgery     from fall  drilled hole in brain to relieve pressure   Home Medications:  Allergies as of 12/12/2018      Reactions   Enalapril Maleate Other (See Comments)      Medication List       Accurate as of December 11, 2018  8:35 PM. If you have any questions, ask your nurse or doctor.        amLODipine 10 MG tablet Commonly known as: NORVASC Take by mouth.   aspirin 325 MG tablet Take 325 mg by mouth daily.   carvedilol 25 MG tablet Commonly known as: COREG Take 25 mg by mouth.   cyclobenzaprine 10 MG tablet Commonly known as: FLEXERIL Take 1 tablet (10 mg total) by mouth 3 (three) times daily as needed for muscle spasms.   gabapentin 300 MG capsule Commonly known as: NEURONTIN Take 1 capsule by mouth at bedtime.   LEVOTHYROXINE SODIUM PO Take by mouth.   lisinopril-hydrochlorothiazide 20-12.5 MG tablet Commonly known as: ZESTORETIC Take 1 tablet by mouth daily.   metFORMIN 1000 MG tablet Commonly known as: GLUCOPHAGE Take 1,000 mg by mouth.   methocarbamol 750 MG tablet Commonly known as: ROBAXIN Take 750 mg by mouth every 8 (eight) hours as needed. Reported on 06/02/2015  metoprolol succinate 100 MG 24 hr tablet Commonly known as: TOPROL-XL Take 100 mg by mouth daily. Take with or immediately following a meal.   oxyCODONE-acetaminophen 7.5-325 MG tablet Commonly known as: PERCOCET   rosuvastatin 10 MG tablet Commonly known as: CRESTOR 1 tablet daily.   spironolactone 100 MG tablet Commonly known as: ALDACTONE Take 100 mg by mouth daily.   tadalafil 5 MG tablet Commonly known as: CIALIS Take 1 tablet (5 mg total) by mouth daily as needed for erectile dysfunction.   traMADol 50 MG tablet Commonly known as: ULTRAM Take 1 tablet (50 mg total) by mouth 3 (three) times daily. Reported on 06/02/2015      Allergies:  Allergies  Allergen Reactions  . Enalapril Maleate Other (See Comments)   Family History: Family  History  Problem Relation Age of Onset  . Cancer Maternal Aunt   . Prostate cancer Neg Hx   . Kidney disease Neg Hx   . Kidney cancer Neg Hx   . Bladder Cancer Neg Hx     Social History:  reports that he has quit smoking. He has never used smokeless tobacco. He reports that he does not drink alcohol or use drugs.  ROS:                                        Physical Exam: There were no vitals taken for this visit.  Constitutional:  Well nourished. Alert and oriented, No acute distress. HEENT: [place] AT, moist mucus membranes.  Trachea midline, no masses. Cardiovascular: No clubbing, cyanosis, or edema. Respiratory: Normal respiratory effort, no increased work of breathing. GI: Abdomen is soft, non tender, non distended, no abdominal masses. Liver and spleen not palpable.  No hernias appreciated.  Stool sample for occult testing is not indicated.   GU: No CVA tenderness.  No bladder fullness or masses.  Patient with circumcised/uncircumcised phallus. ***Foreskin easily retracted***  Urethral meatus is patent.  No penile discharge. No penile lesions or rashes. Scrotum without lesions, cysts, rashes and/or edema.  Testicles are located scrotally bilaterally. No masses are appreciated in the testicles. Left and right epididymis are normal. Rectal: Patient with  normal sphincter tone. Anus and perineum without scarring or rashes. No rectal masses are appreciated. Prostate is approximately *** grams, *** nodules are appreciated. Seminal vesicles are normal. Skin: No rashes, bruises or suspicious lesions. Lymph: No cervical or inguinal adenopathy. Neurologic: Grossly intact, no focal deficits, moving all 4 extremities. Psychiatric: Normal mood and affect.  Laboratory Data: Lab Results  Component Value Date   TESTOSTERONE 429 04/19/2017   Results for orders placed or performed in visit on 11/07/18  Microscopic Examination   URINE  Result Value Ref Range   WBC, UA 0-5  0 - 5 /hpf   RBC None seen 0 - 2 /hpf   Epithelial Cells (non renal) 0-10 0 - 10 /hpf   Bacteria, UA None seen None seen/Few  PSA  Result Value Ref Range   Prostate Specific Ag, Serum 0.6 0.0 - 4.0 ng/mL  Urinalysis, Complete  Result Value Ref Range   Specific Gravity, UA 1.025 1.005 - 1.030   pH, UA 6.0 5.0 - 7.5   Color, UA Yellow Yellow   Appearance Ur Clear Clear   Leukocytes,UA Negative Negative   Protein,UA 1+ (A) Negative/Trace   Glucose, UA Trace (A) Negative   Ketones, UA Negative Negative   RBC,  UA Negative Negative   Bilirubin, UA Negative Negative   Urobilinogen, Ur 1.0 0.2 - 1.0 mg/dL   Nitrite, UA Negative Negative   Microscopic Examination See below:   I have reviewed the labs.  Assessment & Plan:    1. BPH with LUTS IPSS score is 10/4, it is worsening Continue conservative management, avoiding bladder irritants and timed voiding's Most bothersome symptoms is/are intermittency, straining to urinate, weak stream and a splitting of the stream Initiate Cialis 5 mg daily as patient is not wanting any medication that intefers with sexual function and he is not wanting any bladder outlet procedure for the same reason RTC in one month for I PSS and PVR   2. Erectile dysfunction SHIM score is 16, it is improving I had a discussion with Mr. Mahnken concerning his expectations with his sexual intercourse.  The discomfort and the diminished erections that he experiences after having 4 to 5 days in a row of satisfactory intercourse is likely due to the straining of his pelvic floor muscles.  I offered a referral physical therapy, but he is declined. He is taking daily Cialis 5 mg for his BPH and it may be helpful for his erection and perineal discomfort    No follow-ups on file.  Zara Council, PA-C [facility] [address] [place], [place] [address] [phone]

## 2018-12-12 ENCOUNTER — Ambulatory Visit: Payer: Medicare HMO | Admitting: Urology

## 2018-12-13 DIAGNOSIS — M5416 Radiculopathy, lumbar region: Secondary | ICD-10-CM | POA: Diagnosis not present

## 2018-12-13 DIAGNOSIS — M25519 Pain in unspecified shoulder: Secondary | ICD-10-CM | POA: Diagnosis not present

## 2018-12-13 DIAGNOSIS — Z79899 Other long term (current) drug therapy: Secondary | ICD-10-CM | POA: Diagnosis not present

## 2018-12-13 DIAGNOSIS — G894 Chronic pain syndrome: Secondary | ICD-10-CM | POA: Diagnosis not present
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
## 2018-12-14 ENCOUNTER — Ambulatory Visit: Payer: Medicare HMO | Admitting: Urology

## 2018-12-14 ENCOUNTER — Encounter: Payer: Self-pay | Admitting: Urology

## 2019-01-02 DIAGNOSIS — M9901 Segmental and somatic dysfunction of cervical region: Secondary | ICD-10-CM | POA: Diagnosis not present

## 2019-01-02 DIAGNOSIS — M5033 Other cervical disc degeneration, cervicothoracic region: Secondary | ICD-10-CM | POA: Diagnosis not present

## 2019-01-02 DIAGNOSIS — M9902 Segmental and somatic dysfunction of thoracic region: Secondary | ICD-10-CM | POA: Diagnosis not present

## 2019-01-02 DIAGNOSIS — M5414 Radiculopathy, thoracic region: Secondary | ICD-10-CM | POA: Diagnosis not present

## 2019-01-03 DIAGNOSIS — M9901 Segmental and somatic dysfunction of cervical region: Secondary | ICD-10-CM | POA: Diagnosis not present

## 2019-01-03 DIAGNOSIS — M5414 Radiculopathy, thoracic region: Secondary | ICD-10-CM | POA: Diagnosis not present

## 2019-01-03 DIAGNOSIS — M5033 Other cervical disc degeneration, cervicothoracic region: Secondary | ICD-10-CM | POA: Diagnosis not present

## 2019-01-03 DIAGNOSIS — M9902 Segmental and somatic dysfunction of thoracic region: Secondary | ICD-10-CM | POA: Diagnosis not present

## 2019-01-05 DIAGNOSIS — M9902 Segmental and somatic dysfunction of thoracic region: Secondary | ICD-10-CM | POA: Diagnosis not present

## 2019-01-05 DIAGNOSIS — M9901 Segmental and somatic dysfunction of cervical region: Secondary | ICD-10-CM | POA: Diagnosis not present

## 2019-01-05 DIAGNOSIS — M5033 Other cervical disc degeneration, cervicothoracic region: Secondary | ICD-10-CM | POA: Diagnosis not present

## 2019-01-05 DIAGNOSIS — M5414 Radiculopathy, thoracic region: Secondary | ICD-10-CM | POA: Diagnosis not present

## 2019-01-08 DIAGNOSIS — M5033 Other cervical disc degeneration, cervicothoracic region: Secondary | ICD-10-CM | POA: Diagnosis not present

## 2019-01-08 DIAGNOSIS — M9901 Segmental and somatic dysfunction of cervical region: Secondary | ICD-10-CM | POA: Diagnosis not present

## 2019-01-08 DIAGNOSIS — M9902 Segmental and somatic dysfunction of thoracic region: Secondary | ICD-10-CM | POA: Diagnosis not present

## 2019-01-08 DIAGNOSIS — M5414 Radiculopathy, thoracic region: Secondary | ICD-10-CM | POA: Diagnosis not present

## 2019-01-10 DIAGNOSIS — M9901 Segmental and somatic dysfunction of cervical region: Secondary | ICD-10-CM | POA: Diagnosis not present

## 2019-01-10 DIAGNOSIS — M5033 Other cervical disc degeneration, cervicothoracic region: Secondary | ICD-10-CM | POA: Diagnosis not present

## 2019-01-10 DIAGNOSIS — M5414 Radiculopathy, thoracic region: Secondary | ICD-10-CM | POA: Diagnosis not present

## 2019-01-10 DIAGNOSIS — M9902 Segmental and somatic dysfunction of thoracic region: Secondary | ICD-10-CM | POA: Diagnosis not present

## 2019-01-11 DIAGNOSIS — M9901 Segmental and somatic dysfunction of cervical region: Secondary | ICD-10-CM | POA: Diagnosis not present

## 2019-01-11 DIAGNOSIS — M9902 Segmental and somatic dysfunction of thoracic region: Secondary | ICD-10-CM | POA: Diagnosis not present

## 2019-01-11 DIAGNOSIS — M5414 Radiculopathy, thoracic region: Secondary | ICD-10-CM | POA: Diagnosis not present

## 2019-01-11 DIAGNOSIS — M5033 Other cervical disc degeneration, cervicothoracic region: Secondary | ICD-10-CM | POA: Diagnosis not present

## 2019-01-13 DIAGNOSIS — M5414 Radiculopathy, thoracic region: Secondary | ICD-10-CM | POA: Diagnosis not present

## 2019-01-13 DIAGNOSIS — M9902 Segmental and somatic dysfunction of thoracic region: Secondary | ICD-10-CM | POA: Diagnosis not present

## 2019-01-13 DIAGNOSIS — M5033 Other cervical disc degeneration, cervicothoracic region: Secondary | ICD-10-CM | POA: Diagnosis not present

## 2019-01-13 DIAGNOSIS — M9901 Segmental and somatic dysfunction of cervical region: Secondary | ICD-10-CM | POA: Diagnosis not present

## 2019-02-09 DIAGNOSIS — G894 Chronic pain syndrome: Secondary | ICD-10-CM | POA: Diagnosis not present

## 2019-02-09 DIAGNOSIS — Z79899 Other long term (current) drug therapy: Secondary | ICD-10-CM | POA: Diagnosis not present

## 2019-02-09 DIAGNOSIS — M25519 Pain in unspecified shoulder: Secondary | ICD-10-CM | POA: Diagnosis not present

## 2019-02-09 DIAGNOSIS — M5416 Radiculopathy, lumbar region: Secondary | ICD-10-CM | POA: Diagnosis not present

## 2019-03-06 DIAGNOSIS — R5381 Other malaise: Secondary | ICD-10-CM | POA: Diagnosis not present

## 2019-03-06 DIAGNOSIS — A64 Unspecified sexually transmitted disease: Secondary | ICD-10-CM | POA: Diagnosis not present

## 2019-03-06 DIAGNOSIS — E7849 Other hyperlipidemia: Secondary | ICD-10-CM | POA: Diagnosis not present

## 2019-03-06 DIAGNOSIS — M7550 Bursitis of unspecified shoulder: Secondary | ICD-10-CM | POA: Diagnosis not present

## 2019-03-06 DIAGNOSIS — N400 Enlarged prostate without lower urinary tract symptoms: Secondary | ICD-10-CM | POA: Diagnosis not present

## 2019-03-06 DIAGNOSIS — E119 Type 2 diabetes mellitus without complications: Secondary | ICD-10-CM | POA: Diagnosis not present

## 2019-03-06 DIAGNOSIS — C61 Malignant neoplasm of prostate: Secondary | ICD-10-CM | POA: Diagnosis not present

## 2019-03-06 DIAGNOSIS — I10 Essential (primary) hypertension: Secondary | ICD-10-CM | POA: Diagnosis not present

## 2019-03-06 DIAGNOSIS — R350 Frequency of micturition: Secondary | ICD-10-CM | POA: Diagnosis not present

## 2019-03-06 DIAGNOSIS — M12349 Palindromic rheumatism, unspecified hand: Secondary | ICD-10-CM | POA: Diagnosis not present

## 2019-03-06 DIAGNOSIS — E785 Hyperlipidemia, unspecified: Secondary | ICD-10-CM | POA: Diagnosis not present

## 2019-03-23 ENCOUNTER — Ambulatory Visit: Payer: Medicare HMO | Admitting: Physician Assistant

## 2019-04-04 ENCOUNTER — Ambulatory Visit: Payer: Medicare HMO | Admitting: Urology

## 2019-04-05 ENCOUNTER — Encounter: Payer: Self-pay | Admitting: Urology

## 2019-05-16 DIAGNOSIS — Z5181 Encounter for therapeutic drug level monitoring: Secondary | ICD-10-CM | POA: Diagnosis not present

## 2019-05-16 DIAGNOSIS — M25519 Pain in unspecified shoulder: Secondary | ICD-10-CM | POA: Diagnosis not present

## 2019-05-16 DIAGNOSIS — M5416 Radiculopathy, lumbar region: Secondary | ICD-10-CM | POA: Diagnosis not present

## 2019-05-16 DIAGNOSIS — Z79899 Other long term (current) drug therapy: Secondary | ICD-10-CM | POA: Diagnosis not present

## 2019-05-16 DIAGNOSIS — G894 Chronic pain syndrome: Secondary | ICD-10-CM | POA: Diagnosis not present

## 2019-05-17 DIAGNOSIS — M5416 Radiculopathy, lumbar region: Secondary | ICD-10-CM | POA: Diagnosis not present

## 2019-05-17 DIAGNOSIS — M48061 Spinal stenosis, lumbar region without neurogenic claudication: Secondary | ICD-10-CM | POA: Diagnosis not present

## 2019-05-24 DIAGNOSIS — M5416 Radiculopathy, lumbar region: Secondary | ICD-10-CM | POA: Diagnosis not present

## 2019-06-04 DIAGNOSIS — M48061 Spinal stenosis, lumbar region without neurogenic claudication: Secondary | ICD-10-CM | POA: Diagnosis not present

## 2019-06-07 DIAGNOSIS — M48061 Spinal stenosis, lumbar region without neurogenic claudication: Secondary | ICD-10-CM | POA: Diagnosis not present

## 2019-07-12 ENCOUNTER — Telehealth: Payer: Self-pay | Admitting: *Deleted

## 2019-07-12 NOTE — Telephone Encounter (Signed)
error 

## 2019-07-14 DIAGNOSIS — M5416 Radiculopathy, lumbar region: Secondary | ICD-10-CM | POA: Diagnosis not present

## 2019-07-19 ENCOUNTER — Ambulatory Visit (INDEPENDENT_AMBULATORY_CARE_PROVIDER_SITE_OTHER): Payer: Medicare PPO | Admitting: Internal Medicine

## 2019-07-19 ENCOUNTER — Other Ambulatory Visit: Payer: Self-pay

## 2019-07-19 ENCOUNTER — Encounter: Payer: Self-pay | Admitting: Internal Medicine

## 2019-07-19 VITALS — BP 176/95 | HR 59 | Ht 73.0 in | Wt 237.5 lb

## 2019-07-19 DIAGNOSIS — N400 Enlarged prostate without lower urinary tract symptoms: Secondary | ICD-10-CM

## 2019-07-19 DIAGNOSIS — G8929 Other chronic pain: Secondary | ICD-10-CM | POA: Diagnosis not present

## 2019-07-19 DIAGNOSIS — N1831 Chronic kidney disease, stage 3a: Secondary | ICD-10-CM | POA: Diagnosis not present

## 2019-07-19 DIAGNOSIS — M5441 Lumbago with sciatica, right side: Secondary | ICD-10-CM | POA: Diagnosis not present

## 2019-07-19 DIAGNOSIS — M5442 Lumbago with sciatica, left side: Secondary | ICD-10-CM

## 2019-07-19 DIAGNOSIS — I10 Essential (primary) hypertension: Secondary | ICD-10-CM

## 2019-07-19 MED ORDER — METOPROLOL SUCCINATE ER 100 MG PO TB24: 100.0000 mg | ORAL_TABLET | Freq: Every day | ORAL | 3 refills | Status: DC

## 2019-07-19 MED ORDER — AMLODIPINE BESYLATE 10 MG PO TABS: 10.0000 mg | ORAL_TABLET | Freq: Every day | ORAL | 3 refills | Status: DC

## 2019-07-19 MED ORDER — ROSUVASTATIN CALCIUM 10 MG PO TABS: 10.0000 mg | ORAL_TABLET | Freq: Every day | ORAL | 3 refills | Status: DC

## 2019-07-19 NOTE — Assessment & Plan Note (Signed)
Will order  PSA

## 2019-07-19 NOTE — Assessment & Plan Note (Addendum)
Will repeat  Kidney  Function , pt to controll BP
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
## 2019-07-19 NOTE — Assessment & Plan Note (Signed)
-   Today, the patient's blood pressure is elevated  , not taking  His med. - The patient will continue the current treatment regimen.  - I encouraged the patient to eat a low-sodium diet to help control blood pressure. - I encouraged the patient to live an active lifestyle and complete activities that increases heart rate to 85% target heart rate at least 5 times per week for one hour. Med refilled

## 2019-07-19 NOTE — Assessment & Plan Note (Signed)
Follow up  With  Pain specialist

## 2019-07-19 NOTE — Progress Notes (Deleted)
07/20/2019  10:09 PM   Paul Ramus [name] [birth year] [id number]  Referring provider: Cletis Athens, MD [address] [place],  [place] [address]  No chief complaint on file.  HPI: Paul Milhouse [name] is a 74 y.o.  male with ED and BPH with LU TS who presents today for follow up.     Erectile dysfunction His SHIM score is 16, which is mild-moderate ED.   His previous SHIM score was 16.  His major complaint is after having intercourse consecutively for 4 to 5 days on the 6th or 7th day, his erections are not as firm and he has a slight burn with ejaculation.  His libido is preserved.   His risk factors for ED are age, BPH, spinal injury, brain injury, DM, HTN, HLD and sleep apnea.  He denies any painful erections or curvatures with his erections.   He is still having spontaneous erections.  He denies any penile discharge.     Score: 1-7 Severe ED 8-11 Moderate ED 12-16 Mild-Moderate ED 17-21 Mild ED 22-25 No ED  BPH WITH LUTS  (prostate and/or bladder) IPSS score: 10/4    Previous score: 10/4    Major complaint(s):  Intermittency, straining to urinate, weak urinary stream and a split stream for the last several months.  He will not take tamsulosin or finasteride due to the sexual side effects.  Denies any dysuria, hematuria or suprapubic pain.   UA is negative.  Denies any recent fevers, chills, nausea or vomiting.   Score:  1-7 Mild 8-19 Moderate 20-35 Severe   PMH: Past Medical History:  Diagnosis Date  . Acute prostatitis   . BPH (benign prostatic hyperplasia)   . Chronic kidney disease   . Dysplasia of prostate   . Erectile dysfunction   . HTN (hypertension)   . Hypogonadism in male   . Orchitis and epididymitis   . Over weight   . Palindromic rheumatism, hand   . Prostatitis   . Rectum pain   . Testicle swelling   . Testicle tenderness   . Testicular mass    Surgical History: Past Surgical History:  Procedure Laterality Date  . BACK SURGERY    .  head surgery     from fall  drilled hole in brain to relieve pressure   Home Medications:  Allergies as of 07/20/2019      Reactions   Enalapril Maleate Other (See Comments)      Medication List       Accurate as of July 19, 2019 10:09 PM. If you have any questions, ask your nurse or doctor.        amLODipine 10 MG tablet Commonly known as: NORVASC Take 1 tablet (10 mg total) by mouth daily.   LEVOTHYROXINE SODIUM PO Take by mouth.   lisinopril-hydrochlorothiazide 20-12.5 MG tablet Commonly known as: ZESTORETIC Take 1 tablet by mouth daily.   metoprolol succinate 100 MG 24 hr tablet Commonly known as: TOPROL-XL Take 1 tablet (100 mg total) by mouth daily. Take with or immediately following a meal.   oxyCODONE-acetaminophen 7.5-325 MG tablet Commonly known as: PERCOCET   rosuvastatin 10 MG tablet Commonly known as: CRESTOR Take 1 tablet (10 mg total) by mouth daily.   tadalafil 5 MG tablet Commonly known as: CIALIS Take 1 tablet (5 mg total) by mouth daily as needed for erectile dysfunction.      Allergies:  Allergies  Allergen Reactions  . Enalapril Maleate Other (See Comments)   Family History: Family  History  Problem Relation Age of Onset  . Cancer Maternal Aunt   . Prostate cancer Neg Hx   . Kidney disease Neg Hx   . Kidney cancer Neg Hx   . Bladder Cancer Neg Hx     Social History:  reports that he has quit smoking. He has never used smokeless tobacco. He reports that he does not drink alcohol and does not use drugs.  ROS: For pertinent review of systems please refer to history of present illness  Physical Exam: There were no vitals taken for this visit.  Constitutional:  Well nourished. Alert and oriented, No acute distress. HEENT: [place] AT, moist mucus membranes.  Trachea midline, no masses. Cardiovascular: No clubbing, cyanosis, or edema. Respiratory: Normal respiratory effort, no increased work of breathing. GI: Abdomen is soft, non tender,  non distended, no abdominal masses. Liver and spleen not palpable.  No hernias appreciated.  Stool sample for occult testing is not indicated.   GU: No CVA tenderness.  No bladder fullness or masses.  Patient with circumcised/uncircumcised phallus. ***Foreskin easily retracted***  Urethral meatus is patent.  No penile discharge. No penile lesions or rashes. Scrotum without lesions, cysts, rashes and/or edema.  Testicles are located scrotally bilaterally. No masses are appreciated in the testicles. Left and right epididymis are normal. Rectal: Patient with  normal sphincter tone. Anus and perineum without scarring or rashes. No rectal masses are appreciated. Prostate is approximately *** grams, *** nodules are appreciated. Seminal vesicles are normal. Skin: No rashes, bruises or suspicious lesions. Lymph: No cervical or inguinal adenopathy. Neurologic: Grossly intact, no focal deficits, moving all 4 extremities. Psychiatric: Normal mood and affect.   Laboratory Data: Lab Results  Component Value Date   TESTOSTERONE 429 04/19/2017   Results for orders placed or performed in visit on 11/07/18  Microscopic Examination   URINE  Result Value Ref Range   WBC, UA 0-5 0 - 5 /hpf   RBC None seen 0 - 2 /hpf   Epithelial Cells (non renal) 0-10 0 - 10 /hpf   Bacteria, UA None seen None seen/Few  PSA  Result Value Ref Range   Prostate Specific Ag, Serum 0.6 0.0 - 4.0 ng/mL  Urinalysis, Complete  Result Value Ref Range   Specific Gravity, UA 1.025 1.005 - 1.030   pH, UA 6.0 5.0 - 7.5   Color, UA Yellow Yellow   Appearance Ur Clear Clear   Leukocytes,UA Negative Negative   Protein,UA 1+ (A) Negative/Trace   Glucose, UA Trace (A) Negative   Ketones, UA Negative Negative   RBC, UA Negative Negative   Bilirubin, UA Negative Negative   Urobilinogen, Ur 1.0 0.2 - 1.0 mg/dL   Nitrite, UA Negative Negative   Microscopic Examination See below:    Assessment & Plan:    1. BPH with LUTS IPSS score  is 10/4, it is worsening Continue conservative management, avoiding bladder irritants and timed voiding's Most bothersome symptoms is/are intermittency, straining to urinate, weak stream and a splitting of the stream Initiate Cialis 5 mg daily as patient is not wanting any medication that intefers with sexual function and he is not wanting any bladder outlet procedure for the same reason RTC in one month for I PSS and PVR   2. Erectile dysfunction SHIM score is 16, it is improving I had a discussion with Mr. Capaldi concerning his expectations with his sexual intercourse.  The discomfort and the diminished erections that he experiences after having 4 to 5 days in a  row of satisfactory intercourse is likely due to the straining of his pelvic floor muscles.  I offered a referral physical therapy, but he is declined. He is taking daily Cialis 5 mg for his BPH and it may be helpful for his erection and perineal discomfort    No follow-ups on file.  Zara Council, PA-C [facility] [address], [place], [place] [address] [phone]

## 2019-07-20 ENCOUNTER — Ambulatory Visit: Payer: Medicare PPO | Admitting: Urology

## 2019-08-15 DIAGNOSIS — G894 Chronic pain syndrome: Secondary | ICD-10-CM | POA: Diagnosis not present

## 2019-08-15 DIAGNOSIS — Z79899 Other long term (current) drug therapy: Secondary | ICD-10-CM | POA: Diagnosis not present

## 2019-08-15 DIAGNOSIS — M5416 Radiculopathy, lumbar region: Secondary | ICD-10-CM | POA: Diagnosis not present

## 2019-08-15 DIAGNOSIS — M25519 Pain in unspecified shoulder: Secondary | ICD-10-CM | POA: Diagnosis not present

## 2019-08-16 ENCOUNTER — Ambulatory Visit: Payer: Medicare PPO | Admitting: Internal Medicine

## 2019-08-20 DIAGNOSIS — E785 Hyperlipidemia, unspecified: Secondary | ICD-10-CM | POA: Diagnosis not present

## 2019-08-20 DIAGNOSIS — M170 Bilateral primary osteoarthritis of knee: Secondary | ICD-10-CM | POA: Diagnosis not present

## 2019-08-20 DIAGNOSIS — Z6831 Body mass index (BMI) 31.0-31.9, adult: Secondary | ICD-10-CM | POA: Diagnosis not present

## 2019-08-20 DIAGNOSIS — I10 Essential (primary) hypertension: Secondary | ICD-10-CM | POA: Diagnosis not present

## 2019-08-20 DIAGNOSIS — E039 Hypothyroidism, unspecified: Secondary | ICD-10-CM | POA: Diagnosis not present

## 2019-08-21 ENCOUNTER — Ambulatory Visit: Payer: Medicare PPO | Admitting: Internal Medicine

## 2019-08-28 NOTE — Progress Notes (Deleted)
08/29/2019  1:47 PM   Paul Ramus [name] [birth year] [id number]  Referring provider: Cletis Athens, MD [address] [place],  [place] [address]  No chief complaint on file.  HPI: Paul Leibold [name] is a 74 y.o.  male that presents today for evaluation and management of BPH and erectile dysfunction.    Erectile dysfunction His SHIM score is ***, which is *** ED.   His previous SHIM score was 16.  His major complaint is after having intercourse consecutively for 4 to 5 days on the 6th or 7th day, his erections are not as firm and he has a slight burn with ejaculation.  His libido is preserved.   His risk factors for ED are age, BPH, spinal injury, brain injury, DM, HTN, HLD and sleep apnea.  He denies any painful erections or curvatures with his erections.   He is still having spontaneous erections.  He denies any penile discharge.     Score: 1-7 Severe ED 8-11 Moderate ED 12-16 Mild-Moderate ED 17-21 Mild ED 22-25 No ED  BPH WITH LUTS  (prostate and/or bladder) IPSS score: ***   Previous score: 10/4    Major complaint(s):  Intermittency, straining to urinate, weak urinary stream and a split stream for the last several months.  He will not take tamsulosin or finasteride due to the sexual side effects.  Denies any dysuria, hematuria or suprapubic pain.   UA is negative.  Denies any recent fevers, chills, nausea or vomiting.   Score:  1-7 Mild 8-19 Moderate 20-35 Severe   PMH: Past Medical History:  Diagnosis Date  . Acute prostatitis   . BPH (benign prostatic hyperplasia)   . Chronic kidney disease   . Dysplasia of prostate   . Erectile dysfunction   . HTN (hypertension)   . Hypogonadism in male   . Orchitis and epididymitis   . Over weight   . Palindromic rheumatism, hand   . Prostatitis   . Rectum pain   . Testicle swelling   . Testicle tenderness   . Testicular mass    Surgical History: Past Surgical History:  Procedure Laterality Date  . BACK  SURGERY    . head surgery     from fall  drilled hole in brain to relieve pressure   Home Medications:  Allergies as of 08/29/2019      Reactions   Enalapril Maleate Other (See Comments)      Medication List       Accurate as of August 28, 2019  1:47 PM. If you have any questions, ask your nurse or doctor.        amLODipine 10 MG tablet Commonly known as: NORVASC Take 1 tablet (10 mg total) by mouth daily.   LEVOTHYROXINE SODIUM PO Take by mouth.   lisinopril-hydrochlorothiazide 20-12.5 MG tablet Commonly known as: ZESTORETIC Take 1 tablet by mouth daily.   metoprolol succinate 100 MG 24 hr tablet Commonly known as: TOPROL-XL Take 1 tablet (100 mg total) by mouth daily. Take with or immediately following a meal.   oxyCODONE-acetaminophen 7.5-325 MG tablet Commonly known as: PERCOCET   rosuvastatin 10 MG tablet Commonly known as: CRESTOR Take 1 tablet (10 mg total) by mouth daily.   tadalafil 5 MG tablet Commonly known as: CIALIS Take 1 tablet (5 mg total) by mouth daily as needed for erectile dysfunction.      Allergies:  Allergies  Allergen Reactions  . Enalapril Maleate Other (See Comments)   Family History: Family History  Problem Relation Age of Onset  . Cancer Maternal Aunt   . Prostate cancer Neg Hx   . Kidney disease Neg Hx   . Kidney cancer Neg Hx   . Bladder Cancer Neg Hx     Social History:  reports that he has quit smoking. He has never used smokeless tobacco. He reports that he does not drink alcohol and does not use drugs.  ROS: For pertinent review of systems please refer to history of present illness  Physical Exam: There were no vitals taken for this visit.  Constitutional:  Well nourished. Alert and oriented, No acute distress. HEENT: [place] AT, moist mucus membranes.  Trachea midline Cardiovascular: No clubbing, cyanosis, or edema. Respiratory: Normal respiratory effort, no increased work of breathing. GI: Abdomen is soft, non tender,  non distended, no abdominal masses. Liver and spleen not palpable.  No hernias appreciated.  Stool sample for occult testing is not indicated.   GU: No CVA tenderness.  No bladder fullness or masses.  Patient with circumcised/uncircumcised phallus. ***Foreskin easily retracted***  Urethral meatus is patent.  No penile discharge. No penile lesions or rashes. Scrotum without lesions, cysts, rashes and/or edema.  Testicles are located scrotally bilaterally. No masses are appreciated in the testicles. Left and right epididymis are normal. Rectal: Patient with  normal sphincter tone. Anus and perineum without scarring or rashes. No rectal masses are appreciated. Prostate is approximately *** grams, *** nodules are appreciated. Seminal vesicles are normal. Skin: No rashes, bruises or suspicious lesions. Lymph: No inguinal adenopathy. Neurologic: Grossly intact, no focal deficits, moving all 4 extremities. Psychiatric: Normal mood and affect.   Laboratory Data: Lab Results  Component Value Date   TESTOSTERONE 429 04/19/2017   Results for orders placed or performed in visit on 11/07/18  Microscopic Examination   URINE  Result Value Ref Range   WBC, UA 0-5 0 - 5 /hpf   RBC None seen 0 - 2 /hpf   Epithelial Cells (non renal) 0-10 0 - 10 /hpf   Bacteria, UA None seen None seen/Few  PSA  Result Value Ref Range   Prostate Specific Ag, Serum 0.6 0.0 - 4.0 ng/mL  Urinalysis, Complete  Result Value Ref Range   Specific Gravity, UA 1.025 1.005 - 1.030   pH, UA 6.0 5.0 - 7.5   Color, UA Yellow Yellow   Appearance Ur Clear Clear   Leukocytes,UA Negative Negative   Protein,UA 1+ (A) Negative/Trace   Glucose, UA Trace (A) Negative   Ketones, UA Negative Negative   RBC, UA Negative Negative   Bilirubin, UA Negative Negative   Urobilinogen, Ur 1.0 0.2 - 1.0 mg/dL   Nitrite, UA Negative Negative   Microscopic Examination See below:    Assessment & Plan:    1. BPH with LUTS IPSS score is 10/4, it  is worsening Continue conservative management, avoiding bladder irritants and timed voiding's Most bothersome symptoms is/are intermittency, straining to urinate, weak stream and a splitting of the stream Initiate Cialis 5 mg daily as patient is not wanting any medication that intefers with sexual function and he is not wanting any bladder outlet procedure for the same reason RTC in one month for I PSS and PVR   2. Erectile dysfunction SHIM score is 16, it is improving I had a discussion with Mr. Borthwick concerning his expectations with his sexual intercourse.  The discomfort and the diminished erections that he experiences after having 4 to 5 days in a row of satisfactory intercourse is likely  due to the straining of his pelvic floor muscles.  I offered a referral physical therapy, but he is declined. He is taking daily Cialis 5 mg for his BPH and it may be helpful for his erection and perineal discomfort    No follow-ups on file.  Zara Council, PA-C [facility] [address] [place], [place] [address] [phone]

## 2019-08-29 ENCOUNTER — Ambulatory Visit: Payer: Medicare PPO | Admitting: Urology

## 2019-08-29 DIAGNOSIS — N138 Other obstructive and reflux uropathy: Secondary | ICD-10-CM

## 2019-08-29 DIAGNOSIS — N529 Male erectile dysfunction, unspecified: Secondary | ICD-10-CM

## 2019-08-31 ENCOUNTER — Other Ambulatory Visit: Payer: Self-pay | Admitting: *Deleted

## 2019-08-31 DIAGNOSIS — I10 Essential (primary) hypertension: Secondary | ICD-10-CM

## 2019-08-31 MED ORDER — TRUE METRIX PRO BLOOD GLUCOSE VI STRP: ORAL_STRIP | 3 refills | Status: DC

## 2019-08-31 MED ORDER — LANCETS 33G MISC: 3 refills | Status: DC

## 2019-08-31 MED ORDER — METOPROLOL SUCCINATE ER 100 MG PO TB24: 100.0000 mg | ORAL_TABLET | Freq: Every day | ORAL | 3 refills | Status: DC

## 2019-08-31 MED ORDER — ROSUVASTATIN CALCIUM 10 MG PO TABS: 10.0000 mg | ORAL_TABLET | Freq: Every day | ORAL | 3 refills | Status: DC

## 2019-08-31 MED ORDER — AMLODIPINE BESYLATE 10 MG PO TABS: 10.0000 mg | ORAL_TABLET | Freq: Every day | ORAL | 3 refills | Status: DC

## 2019-08-31 MED ORDER — TRUE METRIX METER DEVI: 3 refills | Status: DC

## 2019-09-07 ENCOUNTER — Other Ambulatory Visit: Payer: Self-pay

## 2019-09-07 MED ORDER — TRUE METRIX LEVEL 1 LOW VI SOLN: 1.0000 | Freq: Every day | 6 refills | Status: DC

## 2019-09-07 MED ORDER — BD SWAB SINGLE USE REGULAR PADS: 100.0000 | MEDICATED_PAD | Freq: Every day | 6 refills | Status: DC

## 2019-09-11 ENCOUNTER — Ambulatory Visit (INDEPENDENT_AMBULATORY_CARE_PROVIDER_SITE_OTHER): Payer: Medicare PPO | Admitting: Internal Medicine

## 2019-09-11 ENCOUNTER — Other Ambulatory Visit: Payer: Self-pay

## 2019-09-11 ENCOUNTER — Encounter: Payer: Self-pay | Admitting: Internal Medicine

## 2019-09-11 VITALS — BP 176/83 | HR 47 | Temp 96.5°F | Ht 72.0 in | Wt 224.0 lb

## 2019-09-11 DIAGNOSIS — M5441 Lumbago with sciatica, right side: Secondary | ICD-10-CM | POA: Diagnosis not present

## 2019-09-11 DIAGNOSIS — M5442 Lumbago with sciatica, left side: Secondary | ICD-10-CM | POA: Diagnosis not present

## 2019-09-11 DIAGNOSIS — G6181 Chronic inflammatory demyelinating polyneuritis: Secondary | ICD-10-CM | POA: Diagnosis not present

## 2019-09-11 DIAGNOSIS — G8929 Other chronic pain: Secondary | ICD-10-CM

## 2019-09-11 DIAGNOSIS — N400 Enlarged prostate without lower urinary tract symptoms: Secondary | ICD-10-CM

## 2019-09-11 DIAGNOSIS — R519 Headache, unspecified: Secondary | ICD-10-CM

## 2019-09-11 DIAGNOSIS — I10 Essential (primary) hypertension: Secondary | ICD-10-CM | POA: Diagnosis not present

## 2019-09-11 MED ORDER — LEVOFLOXACIN 500 MG PO TABS: 500.0000 mg | ORAL_TABLET | Freq: Every day | ORAL | 0 refills | Status: DC

## 2019-09-11 MED ORDER — TAMSULOSIN HCL 0.4 MG PO CAPS: 0.4000 mg | ORAL_CAPSULE | Freq: Every day | ORAL | 3 refills | Status: DC

## 2019-09-11 NOTE — Assessment & Plan Note (Signed)
Will start  On flomax

## 2019-09-11 NOTE — Assessment & Plan Note (Signed)
stable °

## 2019-09-11 NOTE — Progress Notes (Signed)
Established Patient Office Visit  SUBJECTIVE:  Subjective  Patient ID: Paul Frieden [name], male    DOB: 1945-07-29  Age: 74 y.o. MRN: [id number]  CC:  Chief Complaint  Patient presents with   Headache   Urinary Frequency    HPI Paul Platten [name] is a 74 y.o. male presenting today for an evaluation of his recent headaches and increased urinary freqency.   Headache  The current episode started 1 to 4 weeks ago. The problem occurs constantly. The pain is located in the temporal and retro-orbital region. The pain does not radiate. The quality of the pain is described as dull. The pain is at a severity of 2/10. Associated symptoms include dizziness (mild), eye pain (left), a loss of balance and sinus pressure (left side). Pertinent negatives include no blurred vision, nausea, phonophobia, photophobia, rhinorrhea or visual change. Nothing aggravates the symptoms. He has tried darkened room for the symptoms. The treatment provided mild relief. His past medical history is significant for hypertension, obesity and recent head traumas (2017 subdural hematoma).  Urinary Frequency  The patient is experiencing no pain. There has been no fever. Associated symptoms include frequency (3-4x per night). Pertinent negatives include no nausea. He has tried nothing for the symptoms. There is no history of a single kidney.     Past Medical History:  Diagnosis Date   Acute prostatitis    BPH (benign prostatic hyperplasia)    Chronic kidney disease    Dysplasia of prostate    Erectile dysfunction    HTN (hypertension)    Hypogonadism in male    Orchitis and epididymitis    Over weight    Palindromic rheumatism, hand    Prostatitis    Rectum pain    Testicle swelling    Testicle tenderness    Testicular mass     Past Surgical History:  Procedure Laterality Date   BACK SURGERY     head surgery     from fall  drilled hole in brain to relieve pressure    Family  History  Problem Relation Age of Onset   Cancer Maternal Aunt    Prostate cancer Neg Hx    Kidney disease Neg Hx    Kidney cancer Neg Hx    Bladder Cancer Neg Hx     Social History   Socioeconomic History   Marital status: Legally Separated    Spouse name: Not on file   Number of children: Not on file   Years of education: Not on file   Highest education level: Not on file  Occupational History   Not on file  Tobacco Use   Smoking status: Former Smoker   Smokeless tobacco: Never Used   Tobacco comment: quit 30 years ago  Vaping Use   Vaping Use: Never used  Substance and Sexual Activity   Alcohol use: No    Alcohol/week: 0.0 standard drinks   Drug use: No   Sexual activity: Not on file  Other Topics Concern   Not on file  Social History Narrative   Not on file   Social Determinants of Health   Financial Resource Strain:    Difficulty of Paying Living Expenses:   Food Insecurity:    Worried About Charity fundraiser in the Last Year:    Arboriculturist in the Last Year:   Transportation Needs:    Film/video editor (Medical):    Lack of Transportation (Non-Medical):   Physical Activity:  Days of Exercise per Week:    Minutes of Exercise per Session:   Stress:    Feeling of Stress :   Social Connections:    Frequency of Communication with Friends and Family:    Frequency of Social Gatherings with Friends and Family:    Attends Religious Services:    Active Member of Clubs or Organizations:    Attends Music therapist:    Marital Status:   Intimate Partner Violence:    Fear of Current or Ex-Partner:    Emotionally Abused:    Physically Abused:    Sexually Abused:      Current Outpatient Medications:    Alcohol Swabs (B-D SINGLE USE SWABS REGULAR) PADS, 100 each by Does not apply route daily., Disp: 100 each, Rfl: 6   amLODipine (NORVASC) 10 MG tablet, Take 1 tablet (10 mg total) by mouth daily.,  Disp: 90 tablet, Rfl: 3   Blood Glucose Calibration (TRUE METRIX LEVEL 1) Low SOLN, 1 Bottle by In Vitro route daily., Disp: 1 each, Rfl: 6   Blood Glucose Monitoring Suppl (TRUE METRIX METER) DEVI, Check blood sugar daily, Disp: 1 each, Rfl: 3   glucose blood (TRUE METRIX PRO BLOOD GLUCOSE) test strip, Use as instructed, Disp: 300 each, Rfl: 3   Lancets 33G MISC, Check blood sugar daily, Disp: 300 each, Rfl: 3   LEVOTHYROXINE SODIUM PO, Take by mouth., Disp: , Rfl:    lisinopril-hydrochlorothiazide (PRINZIDE,ZESTORETIC) 20-12.5 MG per tablet, Take 1 tablet by mouth daily., Disp: , Rfl:    metoprolol succinate (TOPROL-XL) 100 MG 24 hr tablet, Take 1 tablet (100 mg total) by mouth daily. Take with or immediately following a meal., Disp: 90 tablet, Rfl: 3   oxyCODONE-acetaminophen (PERCOCET) 7.5-325 MG tablet, , Disp: , Rfl:    rosuvastatin (CRESTOR) 10 MG tablet, Take 1 tablet (10 mg total) by mouth daily., Disp: 90 tablet, Rfl: 3   tadalafil (CIALIS) 5 MG tablet, Take 1 tablet (5 mg total) by mouth daily as needed for erectile dysfunction., Disp: 90 tablet, Rfl: 3   Allergies  Allergen Reactions   Enalapril Maleate Other (See Comments)    ROS Review of Systems  HENT: Positive for sinus pressure (left side). Negative for rhinorrhea.   Eyes: Positive for pain (left). Negative for blurred vision and photophobia.  Gastrointestinal: Negative for nausea.  Genitourinary: Positive for frequency (3-4x per night).  Neurological: Positive for dizziness (mild), headaches and loss of balance.     OBJECTIVE:    Physical Exam Vitals reviewed.  Constitutional:      Appearance: Normal appearance.  HENT:     Mouth/Throat:     Mouth: Mucous membranes are moist.  Eyes:     Pupils: Pupils are equal, round, and reactive to light.  Neck:     Vascular: No carotid bruit.  Cardiovascular:     Rate and Rhythm: Normal rate and regular rhythm.     Pulses: Normal pulses.     Heart sounds:  Normal heart sounds.  Pulmonary:     Effort: Pulmonary effort is normal.     Breath sounds: Normal breath sounds.  Abdominal:     General: Bowel sounds are normal.     Palpations: Abdomen is soft. There is no hepatomegaly, splenomegaly or mass.     Tenderness: There is no abdominal tenderness.     Hernia: No hernia is present.  Musculoskeletal:     Cervical back: Neck supple.     Right lower leg: No edema.  Left lower leg: No edema.  Skin:    Findings: No rash.  Neurological:     Mental Status: He is alert and oriented to person, place, and time.     Motor: No weakness.  Psychiatric:        Mood and Affect: Mood normal.        Behavior: Behavior normal.     BP (!) 176/83    Pulse (!) 47    Temp (!) 96.5 F (35.8 C)    Ht 6' (1.829 m)    Wt 224 lb (101.6 kg)    BMI 30.38 kg/m  Wt Readings from Last 3 Encounters:  09/11/19 224 lb (101.6 kg)  07/19/19 237 lb 8 oz (107.7 kg)  11/07/18 231 lb (104.8 kg)    Health Maintenance Due  Topic Date Due   HEMOGLOBIN A1C  Never done   Hepatitis C Screening  Never done   FOOT EXAM  Never done   OPHTHALMOLOGY EXAM  Never done   COVID-19 Vaccine (1) Never done   TETANUS/TDAP  Never done   COLONOSCOPY  Never done   PNA vac Low Risk Adult (1 of 2 - PCV13) Never done   INFLUENZA VACCINE  08/26/2019    There are no preventive care reminders to display for this patient.  CBC Latest Ref Rng & Units 10/22/2015 07/21/2015 07/20/2015  WBC 3.8 - 10.6 K/uL 12.9(H) 8.0 16.5(H)  Hemoglobin 13.0 - 18.0 g/dL 11.6(L) 10.7(L) 11.4(L)  Hematocrit 40 - 52 % 34.7(L) 31.3(L) 34.4(L)  Platelets 150 - 440 K/uL 155 164 198   CMP Latest Ref Rng & Units 10/22/2015 07/21/2015 07/20/2015  Glucose 65 - 99 mg/dL 125(H) 137(H) 158(H)  BUN 6 - 20 mg/dL 16 22(H) 18  Creatinine 0.61 - 1.24 mg/dL 1.25(H) 1.41(H) 1.50(H)  Sodium 135 - 145 mmol/L 138 136 138  Potassium 3.5 - 5.1 mmol/L 3.3(L) 3.5 3.4(L)  Chloride 101 - 111 mmol/L 107 106 108  CO2 22 - 32  mmol/L 27 24 25   Calcium 8.9 - 10.3 mg/dL 8.8(L) 8.0(L) 8.3(L)  Total Protein 6.5 - 8.1 g/dL 7.3 - 6.8  Total Bilirubin 0.3 - 1.2 mg/dL 1.5(H) - 1.2  Alkaline Phos 38 - 126 U/L 73 - 61  AST 15 - 41 U/L 47(H) - 32  ALT 17 - 63 U/L 31 - 20    Lab Results  Component Value Date   TSH 7.19 (H) 09/27/2011   Lab Results  Component Value Date   ALBUMIN 3.8 10/22/2015   ANIONGAP 4 (L) 10/22/2015   No results found for: CHOL, HDL, LDLCALC, CHOLHDL No results found for: TRIG No results found for: HGBA1C    ASSESSMENT & PLAN:   Problem List Items Addressed This Visit    None      No orders of the defined types were placed in this encounter.   Follow-up: No follow-ups on file.    Dr. Jane Canary [facility] [address] [place], [place] [address]   By signing my name below, I, [employer], attest that this documentation has been prepared under the direction and in the presence of Cletis Athens, MD. Electronically Signed: Franchot Erichsen 09/11/19, 10:35 AM   I personally performed the services described in this documentation, which was SCRIBED in my presence. The recorded information has been reviewed and considered accurate. It has been edited as necessary during review. Cletis Athens, MD

## 2019-09-11 NOTE — Assessment & Plan Note (Signed)
-   Today, the patient's blood pressure is not well managed on betablockers  , ace and amlodapine. - The patient will continue the current treatment regimen.  - I encouraged the patient to eat a low-sodium diet to help control blood pressure. - I encouraged the patient to live an active lifestyle and complete activities that increases heart rate to 85% target heart rate at least 5 times per week for one hour.

## 2019-09-11 NOTE — Progress Notes (Signed)
Paul Anding [name] is a 74 y.o. male here complains of headaches. Pt also complains of pain in the right heel.  Pt say he gets up several times at night to go to the bathroom

## 2019-09-11 NOTE — Assessment & Plan Note (Signed)
Complaining of headache lt side From thelast 1 week.  There is no head injury no history of nausea vomiting  there is no history of passing out spell.   He also has a left sinus problem.  I gave him Levaquin for 7 days and Claritin 10 mg p.o. daily.  If is not better I will headache got worse vomiting resolved during his CT scan.

## 2019-09-19 DIAGNOSIS — Z79899 Other long term (current) drug therapy: Secondary | ICD-10-CM | POA: Diagnosis not present

## 2019-09-19 DIAGNOSIS — M5416 Radiculopathy, lumbar region: Secondary | ICD-10-CM | POA: Diagnosis not present

## 2019-09-19 DIAGNOSIS — M25519 Pain in unspecified shoulder: Secondary | ICD-10-CM | POA: Diagnosis not present

## 2019-09-19 DIAGNOSIS — G894 Chronic pain syndrome: Secondary | ICD-10-CM | POA: Diagnosis not present

## 2019-09-25 ENCOUNTER — Ambulatory Visit: Payer: Medicare PPO | Admitting: Internal Medicine

## 2019-10-04 ENCOUNTER — Encounter: Payer: Self-pay | Admitting: Urology

## 2019-10-04 ENCOUNTER — Ambulatory Visit: Payer: Medicare PPO | Admitting: Urology

## 2019-10-04 ENCOUNTER — Other Ambulatory Visit: Payer: Self-pay

## 2019-10-04 VITALS — BP 169/88 | HR 60 | Ht 73.0 in | Wt 235.0 lb

## 2019-10-04 DIAGNOSIS — N138 Other obstructive and reflux uropathy: Secondary | ICD-10-CM | POA: Diagnosis not present

## 2019-10-04 DIAGNOSIS — N529 Male erectile dysfunction, unspecified: Secondary | ICD-10-CM

## 2019-10-04 DIAGNOSIS — N401 Enlarged prostate with lower urinary tract symptoms: Secondary | ICD-10-CM

## 2019-10-04 MED ORDER — TADALAFIL 5 MG PO TABS: 5.0000 mg | ORAL_TABLET | Freq: Every day | ORAL | 3 refills | Status: DC | PRN

## 2019-10-04 NOTE — Progress Notes (Signed)
10/04/2019  12:03 PM   Paul Ramus [name] Jul 02, 1945 [id number]  Referring provider: Cletis Athens, MD [address] [place],  [place] [address]  Chief Complaint  Patient presents with  . Acute Visit     UA, IPSS, SHIM and PSA   HPI: Paul Mexicano [name] is a 74 y.o.  male with BPH and erectile dysfunction who presents today for a prostate exam.   Erectile dysfunction His SHIM score is 15, which is mild to moderate ED.   His previous SHIM score was 16.  His risk factors for ED are age, BPH, spinal injury, brain injury, DM, HTN, HLD and sleep apnea.    He denies any pain with erections or curvature with erections.    Main complaint is maintaining.    PDE5i's intolerable due to headaches.     SHIM    Row Name 10/04/19 1129         SHIM: Over the last 6 months:   How do you rate your confidence that you could get and keep an erection? Low     When you had erections with sexual stimulation, how often were your erections hard enough for penetration (entering your partner)? Sometimes (about half the time)     During sexual intercourse, how often were you able to maintain your erection after you had penetrated (entered) your partner? Sometimes (about half the time)     During sexual intercourse, how difficult was it to maintain your erection to completion of intercourse? Slightly Difficult     When you attempted sexual intercourse, how often was it satisfactory for you? Sometimes (about half the time)       SHIM Total Score   SHIM 15            Score: 1-7 Severe ED 8-11 Moderate ED 12-16 Mild-Moderate ED 17-21 Mild ED 22-25 No ED  BPH WITH LUTS  (prostate and/or bladder) IPSS score: 19/5    Previous score: 10/4  Major complaint(s):  Nocturia x 2-3.  Patient denies any modifying or aggravating factors.  Patient denies any gross hematuria, dysuria or suprapubic/flank pain.  Patient denies any fevers, chills, nausea or vomiting.   Maternal uncle with fatal  prostate cancer.       IPSS    Row Name 10/04/19 1100         International Prostate Symptom Score   How often have you had the sensation of not emptying your bladder? Less than 1 in 5     How often have you had to urinate less than every two hours? More than half the time     How often have you found you stopped and started again several times when you urinated? More than half the time     How often have you found it difficult to postpone urination? About half the time     How often have you had a weak urinary stream? Almost always     How often have you had to strain to start urination? Not at All     How many times did you typically get up at night to urinate? 2 Times     Total IPSS Score 19       Quality of Life due to urinary symptoms   If you were to spend the rest of your life with your urinary condition just the way it is now how would you feel about that? Unhappy  Score:  1-7 Mild 8-19 Moderate 20-35 Severe   PMH: Past Medical History:  Diagnosis Date  . Acute prostatitis   . BPH (benign prostatic hyperplasia)   . Chronic kidney disease   . Dysplasia of prostate   . Erectile dysfunction   . HTN (hypertension)   . Hypogonadism in male   . Orchitis and epididymitis   . Over weight   . Palindromic rheumatism, hand   . Prostatitis   . Rectum pain   . Testicle swelling   . Testicle tenderness   . Testicular mass    Surgical History: Past Surgical History:  Procedure Laterality Date  . BACK SURGERY    . head surgery     from fall  drilled hole in brain to relieve pressure   Home Medications:  Allergies as of 10/04/2019      Reactions   Enalapril Maleate Other (See Comments)      Medication List       Accurate as of October 04, 2019 12:03 PM. If you have any questions, ask your nurse or doctor.        STOP taking these medications   levofloxacin 500 MG tablet Commonly known as: LEVAQUIN Stopped by: Zara Council, PA-C     TAKE  these medications   amLODipine 10 MG tablet Commonly known as: NORVASC Take 1 tablet (10 mg total) by mouth daily.   B-D SINGLE USE SWABS REGULAR Pads 100 each by Does not apply route daily.   Lancets 33G Misc Check blood sugar daily   LEVOTHYROXINE SODIUM PO Take by mouth.   lisinopril-hydrochlorothiazide 20-12.5 MG tablet Commonly known as: ZESTORETIC Take 1 tablet by mouth daily.   metoprolol succinate 100 MG 24 hr tablet Commonly known as: TOPROL-XL Take 1 tablet (100 mg total) by mouth daily. Take with or immediately following a meal.   oxyCODONE-acetaminophen 7.5-325 MG tablet Commonly known as: PERCOCET   rosuvastatin 10 MG tablet Commonly known as: CRESTOR Take 1 tablet (10 mg total) by mouth daily.   tadalafil 5 MG tablet Commonly known as: CIALIS Take 1 tablet (5 mg total) by mouth daily as needed for erectile dysfunction. What changed: Another medication with the same name was added. Make sure you understand how and when to take each. Changed by: Zara Council, PA-C   tadalafil 5 MG tablet Commonly known as: CIALIS Take 1 tablet (5 mg total) by mouth daily as needed for erectile dysfunction. What changed: You were already taking a medication with the same name, and this prescription was added. Make sure you understand how and when to take each. Changed by: Zara Council, PA-C   tamsulosin 0.4 MG Caps capsule Commonly known as: FLOMAX Take 1 capsule (0.4 mg total) by mouth daily.   True Metrix Level 1 Low Soln 1 Bottle by In Vitro route daily.   True Metrix Meter Devi Check blood sugar daily   True Metrix Pro Blood Glucose test strip Generic drug: glucose blood Use as instructed      Allergies:  Allergies  Allergen Reactions  . Enalapril Maleate Other (See Comments)   Family History: Family History  Problem Relation Age of Onset  . Cancer Maternal Aunt   . Prostate cancer Neg Hx   . Kidney disease Neg Hx   . Kidney cancer Neg Hx   .  Bladder Cancer Neg Hx     Social History:  reports that he has quit smoking. He has never used smokeless tobacco. He reports that he does  not drink alcohol and does not use drugs.  ROS: For pertinent review of systems please refer to history of present illness  Physical Exam: BP (!) 169/88   Pulse 60   Ht 6\' 1"  (1.854 m)   Wt 235 lb (106.6 kg)   BMI 31.00 kg/m   Constitutional:  Well nourished. Alert and oriented, No acute distress. HEENT: [place] AT, mask in place.  Trachea midline Cardiovascular: No clubbing, cyanosis, or edema. Respiratory: Normal respiratory effort, no increased work of breathing. GU: No CVA tenderness.  No bladder fullness or masses.  Patient with circumcised phallus. Urethral meatus is patent.  No penile discharge. No penile lesions or rashes. Scrotum without lesions, cysts, rashes and/or edema.  Testicles are located scrotally bilaterally. No masses are appreciated in the testicles. Left and right epididymis are normal. Rectal: Patient with  normal sphincter tone. Anus and perineum without scarring or rashes. No rectal masses are appreciated. Prostate is approximately 50 + grams, could only palpate the apex and midportion of the gland, no nodules are appreciated. Seminal vesicles could not palpated Skin: No rashes, bruises or suspicious lesions. Lymph: No inguinal adenopathy. Neurologic: Grossly intact, no focal deficits, moving all 4 extremities. Psychiatric: Normal mood and affect.  Laboratory Data Urinalysis: Component     Latest Ref Rng & Units 10/04/2019  Specific Gravity, UA     1.005 - 1.030 1.020  pH, UA     5.0 - 7.5 7.0  Color, UA     Yellow Yellow  Appearance Ur     Clear Clear  Leukocytes,UA     Negative Negative  Protein,UA     Negative/Trace Negative  Glucose, UA     Negative 2+ (A)  Ketones, UA     Negative Negative  RBC, UA     Negative Negative  Bilirubin, UA     Negative Negative  Urobilinogen, Ur     0.2 - 1.0 mg/dL 0.2  Nitrite,  UA     Negative Negative  Microscopic Examination      See below:   Component     Latest Ref Rng & Units 10/04/2019  WBC, UA     0 - 5 /hpf 0-5  RBC     0 - 2 /hpf 0-2  Epithelial Cells (non renal)     0 - 10 /hpf 0-10  Casts     None seen /lpf Present (A)  Cast Type     N/A Hyaline casts  Bacteria, UA     None seen/Few None seen   Lab Results  Component Value Date   TESTOSTERONE 429 04/19/2017   Results for orders placed or performed in visit on 11/07/18  Microscopic Examination   URINE  Result Value Ref Range   WBC, UA 0-5 0 - 5 /hpf   RBC None seen 0 - 2 /hpf   Epithelial Cells (non renal) 0-10 0 - 10 /hpf   Bacteria, UA None seen None seen/Few  PSA  Result Value Ref Range   Prostate Specific Ag, Serum 0.6 0.0 - 4.0 ng/mL  Urinalysis, Complete  Result Value Ref Range   Specific Gravity, UA 1.025 1.005 - 1.030   pH, UA 6.0 5.0 - 7.5   Color, UA Yellow Yellow   Appearance Ur Clear Clear   Leukocytes,UA Negative Negative   Protein,UA 1+ (A) Negative/Trace   Glucose, UA Trace (A) Negative   Ketones, UA Negative Negative   RBC, UA Negative Negative   Bilirubin, UA Negative Negative  Urobilinogen, Ur 1.0 0.2 - 1.0 mg/dL   Nitrite, UA Negative Negative   Microscopic Examination See below:    Component     Latest Ref Rng & Units 03/25/2015 02/26/2016 04/13/2017 11/07/2018  Prostate Specific Ag, Serum     0.0 - 4.0 ng/mL 0.4 0.1 0.5 0.6   I have reviewed the labs.  Assessment & Plan:    1. BPH with LUTS IPSS score is 19/5, it is worsening Continue conservative management, avoiding bladder irritants and timed voiding's He will restart the Cialis 5 mg daily RTC in one year for I PSS, PSA and exam  2. Erectile dysfunction SHIM score is 15, it is worsening Cannot tolerate PDE 5 inhibitors due to headache Not interested in intracavernosal injections or penile vacuum erectile device  Gave brochure on I PP RTC in one year for exam and SHIM   Return in about 1  year (around 10/03/2020) for PSA, I PSS, SHIM and exam - PSA prior .  Zara Council, PA-C [facility] [address] [place], [place] [address] [phone]

## 2019-10-05 ENCOUNTER — Telehealth: Payer: Self-pay | Admitting: Family Medicine

## 2019-10-05 LAB — URINALYSIS, COMPLETE
Bilirubin, UA: NEGATIVE
Ketones, UA: NEGATIVE
Leukocytes,UA: NEGATIVE
Nitrite, UA: NEGATIVE
Protein,UA: NEGATIVE
RBC, UA: NEGATIVE
Specific Gravity, UA: 1.02 (ref 1.005–1.030)
Urobilinogen, Ur: 0.2 mg/dL (ref 0.2–1.0)
pH, UA: 7 (ref 5.0–7.5)

## 2019-10-05 LAB — MICROSCOPIC EXAMINATION: Bacteria, UA: NONE SEEN

## 2019-10-05 LAB — PSA: Prostate Specific Ag, Serum: 0.3 ng/mL (ref 0.0–4.0)

## 2019-10-05 NOTE — Telephone Encounter (Signed)
-----   Message from [name], PA-C sent at 10/05/2019  8:10 AM EDT ----- Please let Mr. Humbarger know that his PSA returned at 0.3 which is great.  We will see him next year.

## 2019-10-05 NOTE — Telephone Encounter (Signed)
Patient notified and voiced understanding.

## 2019-10-12 DIAGNOSIS — M5416 Radiculopathy, lumbar region: Secondary | ICD-10-CM | POA: Diagnosis not present

## 2019-10-27 DIAGNOSIS — S8991XA Unspecified injury of right lower leg, initial encounter: Secondary | ICD-10-CM | POA: Diagnosis not present

## 2019-10-27 DIAGNOSIS — N400 Enlarged prostate without lower urinary tract symptoms: Secondary | ICD-10-CM | POA: Diagnosis not present

## 2019-10-27 DIAGNOSIS — E079 Disorder of thyroid, unspecified: Secondary | ICD-10-CM | POA: Diagnosis not present

## 2019-10-27 DIAGNOSIS — Z87891 Personal history of nicotine dependence: Secondary | ICD-10-CM | POA: Diagnosis not present

## 2019-10-27 DIAGNOSIS — S8011XA Contusion of right lower leg, initial encounter: Secondary | ICD-10-CM | POA: Diagnosis not present

## 2019-10-27 DIAGNOSIS — Z7984 Long term (current) use of oral hypoglycemic drugs: Secondary | ICD-10-CM | POA: Diagnosis not present

## 2019-10-27 DIAGNOSIS — M7989 Other specified soft tissue disorders: Secondary | ICD-10-CM | POA: Diagnosis not present

## 2019-10-27 DIAGNOSIS — N183 Chronic kidney disease, stage 3 unspecified: Secondary | ICD-10-CM | POA: Diagnosis not present

## 2019-10-27 DIAGNOSIS — I129 Hypertensive chronic kidney disease with stage 1 through stage 4 chronic kidney disease, or unspecified chronic kidney disease: Secondary | ICD-10-CM | POA: Diagnosis not present

## 2019-10-27 DIAGNOSIS — E1122 Type 2 diabetes mellitus with diabetic chronic kidney disease: Secondary | ICD-10-CM | POA: Diagnosis not present

## 2019-10-31 DIAGNOSIS — E1122 Type 2 diabetes mellitus with diabetic chronic kidney disease: Secondary | ICD-10-CM | POA: Diagnosis not present

## 2019-10-31 DIAGNOSIS — L02415 Cutaneous abscess of right lower limb: Secondary | ICD-10-CM | POA: Diagnosis not present

## 2019-10-31 DIAGNOSIS — B9561 Methicillin susceptible Staphylococcus aureus infection as the cause of diseases classified elsewhere: Secondary | ICD-10-CM | POA: Diagnosis not present

## 2019-10-31 DIAGNOSIS — N183 Chronic kidney disease, stage 3 unspecified: Secondary | ICD-10-CM | POA: Diagnosis not present

## 2019-10-31 DIAGNOSIS — M7989 Other specified soft tissue disorders: Secondary | ICD-10-CM | POA: Diagnosis not present

## 2019-10-31 DIAGNOSIS — I82409 Acute embolism and thrombosis of unspecified deep veins of unspecified lower extremity: Secondary | ICD-10-CM | POA: Diagnosis not present

## 2019-10-31 DIAGNOSIS — Z20822 Contact with and (suspected) exposure to covid-19: Secondary | ICD-10-CM | POA: Diagnosis not present

## 2019-10-31 DIAGNOSIS — M799 Soft tissue disorder, unspecified: Secondary | ICD-10-CM | POA: Diagnosis not present

## 2019-10-31 DIAGNOSIS — I10 Essential (primary) hypertension: Secondary | ICD-10-CM | POA: Diagnosis not present

## 2019-10-31 DIAGNOSIS — N400 Enlarged prostate without lower urinary tract symptoms: Secondary | ICD-10-CM | POA: Diagnosis not present

## 2019-10-31 DIAGNOSIS — I129 Hypertensive chronic kidney disease with stage 1 through stage 4 chronic kidney disease, or unspecified chronic kidney disease: Secondary | ICD-10-CM | POA: Diagnosis not present

## 2019-10-31 DIAGNOSIS — L03115 Cellulitis of right lower limb: Secondary | ICD-10-CM | POA: Diagnosis not present

## 2019-10-31 DIAGNOSIS — M1990 Unspecified osteoarthritis, unspecified site: Secondary | ICD-10-CM | POA: Diagnosis not present

## 2019-10-31 DIAGNOSIS — E119 Type 2 diabetes mellitus without complications: Secondary | ICD-10-CM | POA: Diagnosis not present

## 2019-10-31 DIAGNOSIS — E039 Hypothyroidism, unspecified: Secondary | ICD-10-CM | POA: Diagnosis not present

## 2019-11-09 ENCOUNTER — Other Ambulatory Visit: Payer: Self-pay | Admitting: Internal Medicine

## 2019-11-09 ENCOUNTER — Inpatient Hospital Stay: Payer: Medicare PPO | Admitting: Family Medicine

## 2019-11-09 DIAGNOSIS — I10 Essential (primary) hypertension: Secondary | ICD-10-CM

## 2019-11-09 DIAGNOSIS — M47896 Other spondylosis, lumbar region: Secondary | ICD-10-CM | POA: Diagnosis not present

## 2019-11-13 DIAGNOSIS — L02415 Cutaneous abscess of right lower limb: Secondary | ICD-10-CM | POA: Diagnosis not present

## 2019-11-20 DIAGNOSIS — M5416 Radiculopathy, lumbar region: Secondary | ICD-10-CM | POA: Diagnosis not present

## 2019-11-27 ENCOUNTER — Encounter: Payer: Self-pay | Admitting: Internal Medicine

## 2019-11-27 ENCOUNTER — Ambulatory Visit (INDEPENDENT_AMBULATORY_CARE_PROVIDER_SITE_OTHER): Payer: Medicare PPO | Admitting: Internal Medicine

## 2019-11-27 ENCOUNTER — Other Ambulatory Visit: Payer: Self-pay

## 2019-11-27 VITALS — BP 140/80 | HR 88 | Ht 73.0 in | Wt 228.1 lb

## 2019-11-27 DIAGNOSIS — E039 Hypothyroidism, unspecified: Secondary | ICD-10-CM | POA: Diagnosis not present

## 2019-11-27 DIAGNOSIS — N400 Enlarged prostate without lower urinary tract symptoms: Secondary | ICD-10-CM | POA: Diagnosis not present

## 2019-11-27 DIAGNOSIS — K219 Gastro-esophageal reflux disease without esophagitis: Secondary | ICD-10-CM | POA: Diagnosis not present

## 2019-11-27 DIAGNOSIS — E119 Type 2 diabetes mellitus without complications: Secondary | ICD-10-CM | POA: Diagnosis not present

## 2019-11-27 DIAGNOSIS — I10 Essential (primary) hypertension: Secondary | ICD-10-CM

## 2019-11-27 MED ORDER — OMEPRAZOLE 20 MG PO CPDR: 20.0000 mg | DELAYED_RELEASE_CAPSULE | Freq: Every day | ORAL | 3 refills | Status: DC

## 2019-11-27 MED ORDER — CIPROFLOXACIN HCL 500 MG PO TABS: 500.0000 mg | ORAL_TABLET | Freq: Two times a day (BID) | ORAL | 0 refills | Status: DC

## 2019-11-27 NOTE — Assessment & Plan Note (Signed)
Patient has a large prostate with prostatitis he is having to go to the bathroom for 5 times at night.  We will give him a course of Cipro 500 mg p.o. twice a day for 7 to 10 days.

## 2019-11-27 NOTE — Assessment & Plan Note (Signed)
Hypothyroidism is stable on levothyroxine 75 mcg p.o. daily

## 2019-11-27 NOTE — Assessment & Plan Note (Signed)
Blood sugar is 98 today according to the patient.

## 2019-11-27 NOTE — Progress Notes (Signed)
Established Patient Office Visit  Subjective:  Patient ID: Paul Tasker [name], male    DOB: [birth year]  Age: 74 y.o. MRN: [id number]  CC:  Chief Complaint  Patient presents with  . Hypertension    BP elevated   . Urinary Frequency    patient feels his prostate is enlarged, he has to urinate several times through the night and also has lost his sex drive     Hypertension This is a recurrent problem. The current episode started more than 1 month ago. Pertinent negatives include no anxiety, blurred vision, chest pain, headaches, malaise/fatigue, orthopnea, palpitations, peripheral edema, PND, shortness of breath or sweats. Risk factors for coronary artery disease include obesity and male gender. There are no compliance problems.   Urinary Frequency  Associated symptoms include frequency. Pertinent negatives include no sweats.    Paul Ramus [name] patient complaining of frequency of urination.  He also complains of heartburn.  Past Medical History:  Diagnosis Date  . Acute prostatitis   . BPH (benign prostatic hyperplasia)   . Chronic kidney disease   . Dysplasia of prostate   . Erectile dysfunction   . HTN (hypertension)   . Hypogonadism in male   . Orchitis and epididymitis   . Over weight   . Palindromic rheumatism, hand   . Prostatitis   . Rectum pain   . Testicle swelling   . Testicle tenderness   . Testicular mass     Past Surgical History:  Procedure Laterality Date  . BACK SURGERY    . head surgery     from fall  drilled hole in brain to relieve pressure    Family History  Problem Relation Age of Onset  . Cancer Maternal Aunt   . Prostate cancer Neg Hx   . Kidney disease Neg Hx   . Kidney cancer Neg Hx   . Bladder Cancer Neg Hx     Social History   Socioeconomic History  . Marital status: Legally Separated    Spouse name: Not on file  . Number of children: Not on file  . Years of education: Not on file  . Highest education level: Not  on file  Occupational History  . Not on file  Tobacco Use  . Smoking status: Former Research scientist (life sciences)  . Smokeless tobacco: Never Used  . Tobacco comment: quit 30 years ago  Vaping Use  . Vaping Use: Never used  Substance and Sexual Activity  . Alcohol use: No    Alcohol/week: 0.0 standard drinks  . Drug use: No  . Sexual activity: Not on file  Other Topics Concern  . Not on file  Social History Narrative  . Not on file   Social Determinants of Health   Financial Resource Strain:   . Difficulty of Paying Living Expenses: Not on file  Food Insecurity:   . Worried About Charity fundraiser in the Last Year: Not on file  . Ran Out of Food in the Last Year: Not on file  Transportation Needs:   . Lack of Transportation (Medical): Not on file  . Lack of Transportation (Non-Medical): Not on file  Physical Activity:   . Days of Exercise per Week: Not on file  . Minutes of Exercise per Session: Not on file  Stress:   . Feeling of Stress : Not on file  Social Connections:   . Frequency of Communication with Friends and Family: Not on file  . Frequency of Social Gatherings with Friends  and Family: Not on file  . Attends Religious Services: Not on file  . Active Member of Clubs or Organizations: Not on file  . Attends Archivist Meetings: Not on file  . Marital Status: Not on file  Intimate Partner Violence:   . Fear of Current or Ex-Partner: Not on file  . Emotionally Abused: Not on file  . Physically Abused: Not on file  . Sexually Abused: Not on file     Current Outpatient Medications:  .  Alcohol Swabs (B-D SINGLE USE SWABS REGULAR) PADS, 100 each by Does not apply route daily., Disp: 100 each, Rfl: 6 .  amLODipine (NORVASC) 10 MG tablet, Take 1 tablet (10 mg total) by mouth daily., Disp: 90 tablet, Rfl: 3 .  atorvastatin (LIPITOR) 20 MG tablet, Take 20 mg by mouth daily., Disp: , Rfl:  .  Blood Glucose Calibration (TRUE METRIX LEVEL 1) Low SOLN, 1 Bottle by In Vitro route  daily., Disp: 1 each, Rfl: 6 .  Blood Glucose Monitoring Suppl (TRUE METRIX METER) DEVI, Check blood sugar daily, Disp: 1 each, Rfl: 3 .  glucose blood (TRUE METRIX PRO BLOOD GLUCOSE) test strip, Use as instructed, Disp: 300 each, Rfl: 3 .  Lancets 33G MISC, Check blood sugar daily, Disp: 300 each, Rfl: 3 .  levothyroxine (SYNTHROID) 175 MCG tablet, Take 175 mcg by mouth daily., Disp: , Rfl:  .  lisinopril-hydrochlorothiazide (PRINZIDE,ZESTORETIC) 20-12.5 MG per tablet, Take 1 tablet by mouth daily., Disp: , Rfl:  .  metoprolol succinate (TOPROL-XL) 100 MG 24 hr tablet, Take 1 tablet (100 mg total) by mouth daily. Take with or immediately following a meal., Disp: 90 tablet, Rfl: 3 .  oxyCODONE-acetaminophen (PERCOCET) 7.5-325 MG tablet, , Disp: , Rfl:  .  rosuvastatin (CRESTOR) 10 MG tablet, Take 1 tablet by mouth once daily, Disp: 90 tablet, Rfl: 0 .  tadalafil (CIALIS) 5 MG tablet, Take 1 tablet (5 mg total) by mouth daily as needed for erectile dysfunction., Disp: 90 tablet, Rfl: 3 .  tamsulosin (FLOMAX) 0.4 MG CAPS capsule, Take 1 capsule (0.4 mg total) by mouth daily., Disp: 90 capsule, Rfl: 3 .  ciprofloxacin (CIPRO) 500 MG tablet, Take 1 tablet (500 mg total) by mouth 2 (two) times daily., Disp: 20 tablet, Rfl: 0 .  omeprazole (PRILOSEC) 20 MG capsule, Take 1 capsule (20 mg total) by mouth daily., Disp: 30 capsule, Rfl: 3   Allergies  Allergen Reactions  . Enalapril Maleate Other (See Comments)    ROS Review of Systems  Constitutional: Negative for activity change, fatigue and malaise/fatigue.  HENT: Negative for hearing loss.   Eyes: Negative for blurred vision and pain.  Respiratory: Negative for shortness of breath.   Cardiovascular: Negative for chest pain, palpitations, orthopnea and PND.  Genitourinary: Positive for frequency.  Musculoskeletal: Negative for gait problem.  Neurological: Negative for headaches.  Psychiatric/Behavioral: Negative for confusion.        Objective:    Physical Exam Vitals reviewed.  Constitutional:      Appearance: Normal appearance.  HENT:     Mouth/Throat:     Mouth: Mucous membranes are moist.  Eyes:     Pupils: Pupils are equal, round, and reactive to light.  Neck:     Vascular: No carotid bruit.  Cardiovascular:     Rate and Rhythm: Normal rate and regular rhythm.     Pulses: Normal pulses.     Heart sounds: Normal heart sounds.  Pulmonary:     Effort: Pulmonary effort  is normal.     Breath sounds: Normal breath sounds.  Abdominal:     General: Bowel sounds are normal.     Palpations: Abdomen is soft. There is no hepatomegaly, splenomegaly or mass.     Tenderness: There is no abdominal tenderness.     Hernia: No hernia is present.  Musculoskeletal:     Cervical back: Neck supple.     Right lower leg: No edema.     Left lower leg: No edema.  Skin:    Findings: No rash.  Neurological:     Mental Status: He is alert and oriented to person, place, and time.     Motor: No weakness.  Psychiatric:        Mood and Affect: Mood normal.        Behavior: Behavior normal.     BP 140/80   Pulse 88   Ht 6\' 1"  (1.854 m)   Wt 228 lb 1.6 oz (103.5 kg)   BMI 30.09 kg/m  Wt Readings from Last 3 Encounters:  11/27/19 228 lb 1.6 oz (103.5 kg)  10/04/19 235 lb (106.6 kg)  09/11/19 224 lb (101.6 kg)     Health Maintenance Due  Topic Date Due  . HEMOGLOBIN A1C  Never done  . Hepatitis C Screening  Never done  . FOOT EXAM  Never done  . OPHTHALMOLOGY EXAM  Never done  . COVID-19 Vaccine (1) Never done  . TETANUS/TDAP  Never done  . COLONOSCOPY  Never done  . PNA vac Low Risk Adult (1 of 2 - PCV13) Never done    There are no preventive care reminders to display for this patient.  Lab Results  Component Value Date   TSH 7.19 (H) 09/27/2011   Lab Results  Component Value Date   WBC 12.9 (H) 10/22/2015   HGB 11.6 (L) 10/22/2015   HCT 34.7 (L) 10/22/2015   MCV 84.5 10/22/2015   PLT 155 10/22/2015    Lab Results  Component Value Date   NA 138 10/22/2015   K 3.3 (L) 10/22/2015   CO2 27 10/22/2015   GLUCOSE 125 (H) 10/22/2015   BUN 16 10/22/2015   CREATININE 1.25 (H) 10/22/2015   BILITOT 1.5 (H) 10/22/2015   ALKPHOS 73 10/22/2015   AST 47 (H) 10/22/2015   ALT 31 10/22/2015   PROT 7.3 10/22/2015   ALBUMIN 3.8 10/22/2015   CALCIUM 8.8 (L) 10/22/2015   ANIONGAP 4 (L) 10/22/2015   No results found for: CHOL No results found for: HDL No results found for: LDLCALC No results found for: TRIG No results found for: CHOLHDL No results found for: HGBA1C    Assessment & Plan:   Problem List Items Addressed This Visit      Cardiovascular and Mediastinum   Essential hypertension - Primary    - Today, the patient's blood pressure is well managed on amlodapine  And b blockers. - The patient will continue    the current treatment regimen.  - I encouraged the patient to eat a low-sodium diet to help control blood pressure. - I encouraged the patient to live an active lifestyle and complete activities that increases heart rate to 85% target heart rate at least 5 times per week for one hour.          Relevant Medications   atorvastatin (LIPITOR) 20 MG tablet     Endocrine   Diabetes mellitus ([place])    Blood sugar is 98 today according to the patient.  Relevant Medications   atorvastatin (LIPITOR) 20 MG tablet   Hypothyroidism    Hypothyroidism is stable on levothyroxine 175 mcg p.o. daily      Relevant Medications   levothyroxine (SYNTHROID) 175 MCG tablet     Other   Enlarged prostate    Patient has a large prostate with prostatitis he is having to go to the bathroom for 5 times at night.  We will give him a course of Cipro 500 mg p.o. twice a day for 7 to 10 days.      Relevant Medications   ciprofloxacin (CIPRO) 500 MG tablet    Other Visit Diagnoses    Gastroesophageal reflux disease without esophagitis       Relevant Medications   omeprazole (PRILOSEC)  20 MG capsule      Meds ordered this encounter  Medications  . ciprofloxacin (CIPRO) 500 MG tablet    Sig: Take 1 tablet (500 mg total) by mouth 2 (two) times daily.    Dispense:  20 tablet    Refill:  0  . omeprazole (PRILOSEC) 20 MG capsule    Sig: Take 1 capsule (20 mg total) by mouth daily.    Dispense:  30 capsule    Refill:  3    Follow-up: No follow-ups on file.    Cletis Athens, MD

## 2019-11-27 NOTE — Assessment & Plan Note (Signed)
-   Today, the patient's blood pressure is well managed on amlodapine  And b blockers. - The patient will continue    the current treatment regimen.  - I encouraged the patient to eat a low-sodium diet to help control blood pressure. - I encouraged the patient to live an active lifestyle and complete activities that increases heart rate to 85% target heart rate at least 5 times per week for one hour.

## 2019-12-07 DIAGNOSIS — M4316 Spondylolisthesis, lumbar region: Secondary | ICD-10-CM | POA: Diagnosis not present

## 2019-12-24 ENCOUNTER — Other Ambulatory Visit: Payer: Self-pay | Admitting: Internal Medicine

## 2019-12-24 DIAGNOSIS — I10 Essential (primary) hypertension: Secondary | ICD-10-CM

## 2020-01-20 DIAGNOSIS — E079 Disorder of thyroid, unspecified: Secondary | ICD-10-CM | POA: Diagnosis not present

## 2020-01-20 DIAGNOSIS — E119 Type 2 diabetes mellitus without complications: Secondary | ICD-10-CM | POA: Diagnosis not present

## 2020-01-20 DIAGNOSIS — R369 Urethral discharge, unspecified: Secondary | ICD-10-CM | POA: Diagnosis not present

## 2020-01-20 DIAGNOSIS — I10 Essential (primary) hypertension: Secondary | ICD-10-CM | POA: Diagnosis not present

## 2020-01-20 DIAGNOSIS — Z79890 Hormone replacement therapy (postmenopausal): Secondary | ICD-10-CM | POA: Diagnosis not present

## 2020-01-20 DIAGNOSIS — Z7984 Long term (current) use of oral hypoglycemic drugs: Secondary | ICD-10-CM | POA: Diagnosis not present

## 2020-01-20 DIAGNOSIS — M1990 Unspecified osteoarthritis, unspecified site: Secondary | ICD-10-CM | POA: Diagnosis not present

## 2020-01-20 DIAGNOSIS — Z87891 Personal history of nicotine dependence: Secondary | ICD-10-CM | POA: Diagnosis not present

## 2020-01-20 DIAGNOSIS — N390 Urinary tract infection, site not specified: Secondary | ICD-10-CM | POA: Diagnosis not present

## 2020-01-22 ENCOUNTER — Other Ambulatory Visit: Payer: Self-pay | Admitting: Internal Medicine

## 2020-01-22 DIAGNOSIS — I10 Essential (primary) hypertension: Secondary | ICD-10-CM

## 2020-01-28 DIAGNOSIS — R300 Dysuria: Secondary | ICD-10-CM | POA: Diagnosis not present

## 2020-01-28 DIAGNOSIS — N400 Enlarged prostate without lower urinary tract symptoms: Secondary | ICD-10-CM | POA: Diagnosis not present

## 2020-01-28 DIAGNOSIS — E279 Disorder of adrenal gland, unspecified: Secondary | ICD-10-CM | POA: Diagnosis not present

## 2020-01-28 DIAGNOSIS — I10 Essential (primary) hypertension: Secondary | ICD-10-CM | POA: Diagnosis not present

## 2020-01-28 DIAGNOSIS — R1030 Lower abdominal pain, unspecified: Secondary | ICD-10-CM | POA: Diagnosis not present

## 2020-01-28 DIAGNOSIS — E119 Type 2 diabetes mellitus without complications: Secondary | ICD-10-CM | POA: Diagnosis not present

## 2020-01-28 DIAGNOSIS — Z7984 Long term (current) use of oral hypoglycemic drugs: Secondary | ICD-10-CM | POA: Diagnosis not present

## 2020-01-28 DIAGNOSIS — E079 Disorder of thyroid, unspecified: Secondary | ICD-10-CM | POA: Diagnosis not present

## 2020-01-28 DIAGNOSIS — M1990 Unspecified osteoarthritis, unspecified site: Secondary | ICD-10-CM | POA: Diagnosis not present

## 2020-02-05 ENCOUNTER — Ambulatory Visit (INDEPENDENT_AMBULATORY_CARE_PROVIDER_SITE_OTHER): Payer: Medicare PPO | Admitting: Internal Medicine

## 2020-02-05 ENCOUNTER — Other Ambulatory Visit: Payer: Self-pay

## 2020-02-05 DIAGNOSIS — U071 COVID-19: Secondary | ICD-10-CM | POA: Diagnosis not present

## 2020-02-05 DIAGNOSIS — N400 Enlarged prostate without lower urinary tract symptoms: Secondary | ICD-10-CM | POA: Diagnosis not present

## 2020-02-05 DIAGNOSIS — K219 Gastro-esophageal reflux disease without esophagitis: Secondary | ICD-10-CM | POA: Diagnosis not present

## 2020-02-05 DIAGNOSIS — I10 Essential (primary) hypertension: Secondary | ICD-10-CM | POA: Diagnosis not present

## 2020-02-05 LAB — POC COVID19 BINAXNOW: SARS Coronavirus 2 Ag: POSITIVE — AB

## 2020-02-05 MED ORDER — LEVOTHYROXINE SODIUM 175 MCG PO TABS: 175.0000 ug | ORAL_TABLET | Freq: Every day | ORAL | 0 refills | Status: DC

## 2020-02-05 MED ORDER — OMEPRAZOLE 20 MG PO CPDR: 20.0000 mg | DELAYED_RELEASE_CAPSULE | Freq: Every day | ORAL | 0 refills | Status: DC

## 2020-02-05 MED ORDER — ROSUVASTATIN CALCIUM 10 MG PO TABS: 10.0000 mg | ORAL_TABLET | Freq: Every day | ORAL | 0 refills | Status: DC

## 2020-02-05 MED ORDER — AMLODIPINE BESYLATE 10 MG PO TABS: 10.0000 mg | ORAL_TABLET | Freq: Every day | ORAL | 0 refills | Status: DC

## 2020-02-05 MED ORDER — METOPROLOL SUCCINATE ER 100 MG PO TB24: 100.0000 mg | ORAL_TABLET | Freq: Every day | ORAL | 0 refills | Status: DC

## 2020-02-05 MED ORDER — LISINOPRIL-HYDROCHLOROTHIAZIDE 20-12.5 MG PO TABS: 1.0000 | ORAL_TABLET | Freq: Every day | ORAL | 0 refills | Status: DC

## 2020-02-05 MED ORDER — TAMSULOSIN HCL 0.4 MG PO CAPS: 0.4000 mg | ORAL_CAPSULE | Freq: Every day | ORAL | 0 refills | Status: DC

## 2020-02-07 ENCOUNTER — Encounter: Payer: Self-pay | Admitting: Internal Medicine

## 2020-02-07 NOTE — Progress Notes (Signed)
Acute Office Visit  Subjective:    Patient ID: Paul Zeitlin [name], male    DOB: 04-Nov-1945, 75 y.o.   MRN: [id number]  No chief complaint on file.   HPI Patient is in today for covid test  Past Medical History:  Diagnosis Date  . Acute prostatitis   . BPH (benign prostatic hyperplasia)   . Chronic kidney disease   . Dysplasia of prostate   . Erectile dysfunction   . HTN (hypertension)   . Hypogonadism in male   . Orchitis and epididymitis   . Over weight   . Palindromic rheumatism, hand   . Prostatitis   . Rectum pain   . Testicle swelling   . Testicle tenderness   . Testicular mass     Past Surgical History:  Procedure Laterality Date  . BACK SURGERY    . head surgery     from fall  drilled hole in brain to relieve pressure    Family History  Problem Relation Age of Onset  . Cancer Maternal Aunt   . Prostate cancer Neg Hx   . Kidney disease Neg Hx   . Kidney cancer Neg Hx   . Bladder Cancer Neg Hx     Social History   Socioeconomic History  . Marital status: Legally Separated    Spouse name: Not on file  . Number of children: Not on file  . Years of education: Not on file  . Highest education level: Not on file  Occupational History  . Not on file  Tobacco Use  . Smoking status: Former Research scientist (life sciences)  . Smokeless tobacco: Never Used  . Tobacco comment: quit 30 years ago  Vaping Use  . Vaping Use: Never used  Substance and Sexual Activity  . Alcohol use: No    Alcohol/week: 0.0 standard drinks  . Drug use: No  . Sexual activity: Not on file  Other Topics Concern  . Not on file  Social History Narrative  . Not on file   Social Determinants of Health   Financial Resource Strain: Not on file  Food Insecurity: Not on file  Transportation Needs: Not on file  Physical Activity: Not on file  Stress: Not on file  Social Connections: Not on file  Intimate Partner Violence: Not on file    Outpatient Medications Prior to Visit  Medication Sig  Dispense Refill  . Alcohol Swabs (B-D SINGLE USE SWABS REGULAR) PADS 100 each by Does not apply route daily. 100 each 6  . Blood Glucose Calibration (TRUE METRIX LEVEL 1) Low SOLN 1 Bottle by In Vitro route daily. 1 each 6  . Blood Glucose Monitoring Suppl (TRUE METRIX METER) DEVI Check blood sugar daily 1 each 3  . ciprofloxacin (CIPRO) 500 MG tablet Take 1 tablet (500 mg total) by mouth 2 (two) times daily. 20 tablet 0  . glucose blood (TRUE METRIX PRO BLOOD GLUCOSE) test strip Use as instructed 300 each 3  . Lancets 33G MISC Check blood sugar daily 300 each 3  . oxyCODONE-acetaminophen (PERCOCET) 7.5-325 MG tablet     . tadalafil (CIALIS) 5 MG tablet Take 1 tablet (5 mg total) by mouth daily as needed for erectile dysfunction. 90 tablet 3  . amLODipine (NORVASC) 10 MG tablet Take 1 tablet by mouth once daily 90 tablet 0  . atorvastatin (LIPITOR) 20 MG tablet Take 20 mg by mouth daily.    Marland Kitchen levothyroxine (SYNTHROID) 175 MCG tablet Take 175 mcg by mouth daily.    Marland Kitchen  lisinopril-hydrochlorothiazide (PRINZIDE,ZESTORETIC) 20-12.5 MG per tablet Take 1 tablet by mouth daily.    . metoprolol succinate (TOPROL-XL) 100 MG 24 hr tablet Take 1 tablet (100 mg total) by mouth daily. Take with or immediately following a meal. 90 tablet 3  . omeprazole (PRILOSEC) 20 MG capsule Take 1 capsule (20 mg total) by mouth daily. 30 capsule 3  . rosuvastatin (CRESTOR) 10 MG tablet Take 1 tablet by mouth once daily 90 tablet 0  . tamsulosin (FLOMAX) 0.4 MG CAPS capsule Take 1 capsule (0.4 mg total) by mouth daily. 90 capsule 3   No facility-administered medications prior to visit.    Allergies  Allergen Reactions  . Enalapril Maleate Other (See Comments)    Review of Systems     Objective:    Physical Exam  There were no vitals taken for this visit. Wt Readings from Last 3 Encounters:  11/27/19 228 lb 1.6 oz (103.5 kg)  10/04/19 235 lb (106.6 kg)  09/11/19 224 lb (101.6 kg)    Health Maintenance Due   Topic Date Due  . HEMOGLOBIN A1C  Never done  . Hepatitis C Screening  Never done  . COVID-19 Vaccine (1) Never done  . FOOT EXAM  Never done  . OPHTHALMOLOGY EXAM  Never done  . TETANUS/TDAP  Never done  . COLONOSCOPY (Pts 45-13yrs Insurance coverage will need to be confirmed)  Never done  . PNA vac Low Risk Adult (1 of 2 - PCV13) Never done    There are no preventive care reminders to display for this patient.   Lab Results  Component Value Date   TSH 7.19 (H) 09/27/2011   Lab Results  Component Value Date   WBC 12.9 (H) 10/22/2015   HGB 11.6 (L) 10/22/2015   HCT 34.7 (L) 10/22/2015   MCV 84.5 10/22/2015   PLT 155 10/22/2015   Lab Results  Component Value Date   NA 138 10/22/2015   K 3.3 (L) 10/22/2015   CO2 27 10/22/2015   GLUCOSE 125 (H) 10/22/2015   BUN 16 10/22/2015   CREATININE 1.25 (H) 10/22/2015   BILITOT 1.5 (H) 10/22/2015   ALKPHOS 73 10/22/2015   AST 47 (H) 10/22/2015   ALT 31 10/22/2015   PROT 7.3 10/22/2015   ALBUMIN 3.8 10/22/2015   CALCIUM 8.8 (L) 10/22/2015   ANIONGAP 4 (L) 10/22/2015   No results found for: CHOL No results found for: HDL No results found for: LDLCALC No results found for: TRIG No results found for: CHOLHDL No results found for: HGBA1C     Assessment & Plan:   Problem List Items Addressed This Visit      Cardiovascular and Mediastinum   Essential hypertension   Relevant Medications   amLODipine (NORVASC) 10 MG tablet   metoprolol succinate (TOPROL-XL) 100 MG 24 hr tablet   rosuvastatin (CRESTOR) 10 MG tablet   lisinopril-hydrochlorothiazide (ZESTORETIC) 20-12.5 MG tablet     Other   Enlarged prostate   Relevant Medications   tamsulosin (FLOMAX) 0.4 MG CAPS capsule    Other Visit Diagnoses    COVID    -  Primary   Relevant Orders   POC COVID-19 BinaxNow (Completed)   Gastroesophageal reflux disease without esophagitis       Relevant Medications   omeprazole (PRILOSEC) 20 MG capsule       Meds ordered  this encounter  Medications  . tamsulosin (FLOMAX) 0.4 MG CAPS capsule    Sig: Take 1 capsule (0.4 mg total) by mouth daily.  Dispense:  90 capsule    Refill:  0  . amLODipine (NORVASC) 10 MG tablet    Sig: Take 1 tablet (10 mg total) by mouth daily.    Dispense:  90 tablet    Refill:  0  . metoprolol succinate (TOPROL-XL) 100 MG 24 hr tablet    Sig: Take 1 tablet (100 mg total) by mouth daily. Take with or immediately following a meal.    Dispense:  90 tablet    Refill:  0  . rosuvastatin (CRESTOR) 10 MG tablet    Sig: Take 1 tablet (10 mg total) by mouth daily.    Dispense:  90 tablet    Refill:  0  . omeprazole (PRILOSEC) 20 MG capsule    Sig: Take 1 capsule (20 mg total) by mouth daily.    Dispense:  90 capsule    Refill:  0  . levothyroxine (SYNTHROID) 175 MCG tablet    Sig: Take 1 tablet (175 mcg total) by mouth daily.    Dispense:  90 tablet    Refill:  0  . lisinopril-hydrochlorothiazide (ZESTORETIC) 20-12.5 MG tablet    Sig: Take 1 tablet by mouth daily.    Dispense:  90 tablet    Refill:  0   covid test is positive , pt was quarintined  For 10 days  , call PRN  Cletis Athens, MD

## 2020-02-08 DIAGNOSIS — Z79890 Hormone replacement therapy (postmenopausal): Secondary | ICD-10-CM | POA: Diagnosis not present

## 2020-02-08 DIAGNOSIS — E1165 Type 2 diabetes mellitus with hyperglycemia: Secondary | ICD-10-CM | POA: Diagnosis not present

## 2020-02-08 DIAGNOSIS — G4733 Obstructive sleep apnea (adult) (pediatric): Secondary | ICD-10-CM | POA: Diagnosis not present

## 2020-02-08 DIAGNOSIS — K219 Gastro-esophageal reflux disease without esophagitis: Secondary | ICD-10-CM | POA: Diagnosis not present

## 2020-02-08 DIAGNOSIS — D6869 Other thrombophilia: Secondary | ICD-10-CM | POA: Diagnosis not present

## 2020-02-08 DIAGNOSIS — F17200 Nicotine dependence, unspecified, uncomplicated: Secondary | ICD-10-CM | POA: Diagnosis not present

## 2020-02-08 DIAGNOSIS — N400 Enlarged prostate without lower urinary tract symptoms: Secondary | ICD-10-CM | POA: Diagnosis not present

## 2020-02-08 DIAGNOSIS — R509 Fever, unspecified: Secondary | ICD-10-CM | POA: Diagnosis not present

## 2020-02-08 DIAGNOSIS — J9601 Acute respiratory failure with hypoxia: Secondary | ICD-10-CM | POA: Diagnosis not present

## 2020-02-08 DIAGNOSIS — E860 Dehydration: Secondary | ICD-10-CM | POA: Diagnosis not present

## 2020-02-08 DIAGNOSIS — R7401 Elevation of levels of liver transaminase levels: Secondary | ICD-10-CM | POA: Diagnosis not present

## 2020-02-08 DIAGNOSIS — R791 Abnormal coagulation profile: Secondary | ICD-10-CM | POA: Diagnosis not present

## 2020-02-08 DIAGNOSIS — R6520 Severe sepsis without septic shock: Secondary | ICD-10-CM | POA: Diagnosis not present

## 2020-02-08 DIAGNOSIS — R0602 Shortness of breath: Secondary | ICD-10-CM | POA: Diagnosis not present

## 2020-02-08 DIAGNOSIS — R918 Other nonspecific abnormal finding of lung field: Secondary | ICD-10-CM | POA: Diagnosis not present

## 2020-02-08 DIAGNOSIS — I288 Other diseases of pulmonary vessels: Secondary | ICD-10-CM | POA: Diagnosis not present

## 2020-02-08 DIAGNOSIS — N179 Acute kidney failure, unspecified: Secondary | ICD-10-CM | POA: Diagnosis not present

## 2020-02-08 DIAGNOSIS — E785 Hyperlipidemia, unspecified: Secondary | ICD-10-CM | POA: Diagnosis not present

## 2020-02-08 DIAGNOSIS — Z79899 Other long term (current) drug therapy: Secondary | ICD-10-CM | POA: Diagnosis not present

## 2020-02-08 DIAGNOSIS — E279 Disorder of adrenal gland, unspecified: Secondary | ICD-10-CM | POA: Diagnosis not present

## 2020-02-08 DIAGNOSIS — R0902 Hypoxemia: Secondary | ICD-10-CM | POA: Diagnosis not present

## 2020-02-08 DIAGNOSIS — E119 Type 2 diabetes mellitus without complications: Secondary | ICD-10-CM | POA: Diagnosis not present

## 2020-02-08 DIAGNOSIS — A4189 Other specified sepsis: Secondary | ICD-10-CM | POA: Diagnosis not present

## 2020-02-08 DIAGNOSIS — J1282 Pneumonia due to coronavirus disease 2019: Secondary | ICD-10-CM | POA: Diagnosis not present

## 2020-02-08 DIAGNOSIS — I10 Essential (primary) hypertension: Secondary | ICD-10-CM | POA: Diagnosis not present

## 2020-02-08 DIAGNOSIS — U071 COVID-19: Secondary | ICD-10-CM | POA: Diagnosis not present

## 2020-02-08 DIAGNOSIS — Z7984 Long term (current) use of oral hypoglycemic drugs: Secondary | ICD-10-CM | POA: Diagnosis not present

## 2020-02-08 DIAGNOSIS — R001 Bradycardia, unspecified: Secondary | ICD-10-CM | POA: Diagnosis not present

## 2020-02-08 DIAGNOSIS — Z79891 Long term (current) use of opiate analgesic: Secondary | ICD-10-CM | POA: Diagnosis not present

## 2020-02-19 ENCOUNTER — Encounter: Payer: Self-pay | Admitting: Internal Medicine

## 2020-02-19 ENCOUNTER — Other Ambulatory Visit: Payer: Self-pay

## 2020-02-19 ENCOUNTER — Ambulatory Visit (INDEPENDENT_AMBULATORY_CARE_PROVIDER_SITE_OTHER): Payer: Medicare PPO | Admitting: Internal Medicine

## 2020-02-19 VITALS — BP 163/93 | HR 86

## 2020-02-19 DIAGNOSIS — E119 Type 2 diabetes mellitus without complications: Secondary | ICD-10-CM

## 2020-02-19 DIAGNOSIS — U071 COVID-19: Secondary | ICD-10-CM | POA: Diagnosis not present

## 2020-02-19 DIAGNOSIS — E039 Hypothyroidism, unspecified: Secondary | ICD-10-CM | POA: Diagnosis not present

## 2020-02-19 DIAGNOSIS — G4734 Idiopathic sleep related nonobstructive alveolar hypoventilation: Secondary | ICD-10-CM | POA: Diagnosis not present

## 2020-02-19 DIAGNOSIS — I10 Essential (primary) hypertension: Secondary | ICD-10-CM

## 2020-02-19 DIAGNOSIS — N1831 Chronic kidney disease, stage 3a: Secondary | ICD-10-CM | POA: Diagnosis not present

## 2020-02-19 LAB — POC COVID19 BINAXNOW: SARS Coronavirus 2 Ag: POSITIVE — AB

## 2020-02-19 NOTE — Assessment & Plan Note (Signed)
Patient need to continue taking levothyroxine as directed.

## 2020-02-19 NOTE — Assessment & Plan Note (Signed)
Blood sugar is stable on Metformin.

## 2020-02-19 NOTE — Assessment & Plan Note (Signed)
Blood pressure is under control on amlodipine metoprolol has been discontinued because of the bradycardia.

## 2020-02-19 NOTE — Assessment & Plan Note (Signed)
Patient was admitted in [place] with post Covid pneumonia he is recovering slowly.  His medicine was reviewed with him.  He was advised to stop taking Metroprolol.

## 2020-02-19 NOTE — Assessment & Plan Note (Signed)
Patient has acute kidney injury due to Covid which has resolved.

## 2020-02-19 NOTE — Progress Notes (Signed)
Established Patient Office Visit  Subjective:  Patient ID: Paul Sexson [name], male    DOB: Mar 18, 1945  Age: 75 y.o. MRN: [id number]  CC: No chief complaint on file.   HPI  Paul Beevers [name] presents for follow-up unglued initiate from the [facility].  He is doing better want to review his medications and I reviewed all his medications one by one and asked him to continue taking the medication as directed.  As soon as he gets better he will be scheduled for the sleep apnea study.  He was advised to continue using his oxygen 2 L/min today's in the office his oximetry showed O2 saturation of 88.  Patient chest x-ray in [facility] revealed Covid pneumonia patient was also told to get vaccinated in about a month's time.  He was also advised to continue anticoagulant therapy as prescribed from [facility].  Past Medical History:  Diagnosis Date  . Acute prostatitis   . BPH (benign prostatic hyperplasia)   . Chronic kidney disease   . Dysplasia of prostate   . Erectile dysfunction   . HTN (hypertension)   . Hypogonadism in male   . Orchitis and epididymitis   . Over weight   . Palindromic rheumatism, hand   . Prostatitis   . Rectum pain   . Testicle swelling   . Testicle tenderness   . Testicular mass     Past Surgical History:  Procedure Laterality Date  . BACK SURGERY    . head surgery     from fall  drilled hole in brain to relieve pressure    Family History  Problem Relation Age of Onset  . Cancer Maternal Aunt   . Prostate cancer Neg Hx   . Kidney disease Neg Hx   . Kidney cancer Neg Hx   . Bladder Cancer Neg Hx     Social History   Socioeconomic History  . Marital status: Legally Separated    Spouse name: Not on file  . Number of children: Not on file  . Years of education: Not on file  . Highest education level: Not on file  Occupational History  . Not on file  Tobacco Use  . Smoking status: Former Research scientist (life sciences)  . Smokeless tobacco: Never Used   . Tobacco comment: quit 30 years ago  Vaping Use  . Vaping Use: Never used  Substance and Sexual Activity  . Alcohol use: No    Alcohol/week: 0.0 standard drinks  . Drug use: No  . Sexual activity: Not on file  Other Topics Concern  . Not on file  Social History Narrative  . Not on file   Social Determinants of Health   Financial Resource Strain: Not on file  Food Insecurity: Not on file  Transportation Needs: Not on file  Physical Activity: Not on file  Stress: Not on file  Social Connections: Not on file  Intimate Partner Violence: Not on file     Current Outpatient Medications:  .  Alcohol Swabs (B-D SINGLE USE SWABS REGULAR) PADS, 100 each by Does not apply route daily., Disp: 100 each, Rfl: 6 .  amLODipine (NORVASC) 10 MG tablet, Take 1 tablet (10 mg total) by mouth daily., Disp: 90 tablet, Rfl: 0 .  Blood Glucose Calibration (TRUE METRIX LEVEL 1) Low SOLN, 1 Bottle by In Vitro route daily., Disp: 1 each, Rfl: 6 .  Blood Glucose Monitoring Suppl (TRUE METRIX METER) DEVI, Check blood sugar daily, Disp: 1 each, Rfl: 3 .  doxycycline (VIBRAMYCIN) 100 MG capsule, Take 100 mg by mouth daily., Disp: , Rfl:  .  glucose blood (TRUE METRIX PRO BLOOD GLUCOSE) test strip, Use as instructed, Disp: 300 each, Rfl: 3 .  Lancets 33G MISC, Check blood sugar daily, Disp: 300 each, Rfl: 3 .  levothyroxine (SYNTHROID) 175 MCG tablet, Take 1 tablet (175 mcg total) by mouth daily., Disp: 90 tablet, Rfl: 0 .  lisinopril-hydrochlorothiazide (ZESTORETIC) 20-12.5 MG tablet, Take 1 tablet by mouth daily., Disp: 90 tablet, Rfl: 0 .  omeprazole (PRILOSEC) 20 MG capsule, Take 1 capsule (20 mg total) by mouth daily., Disp: 90 capsule, Rfl: 0 .  oxyCODONE-acetaminophen (PERCOCET) 7.5-325 MG tablet, , Disp: , Rfl:  .  rosuvastatin (CRESTOR) 10 MG tablet, Take 1 tablet (10 mg total) by mouth daily., Disp: 90 tablet, Rfl: 0 .  tadalafil (CIALIS) 5 MG tablet, Take 1 tablet (5 mg total) by mouth daily as  needed for erectile dysfunction., Disp: 90 tablet, Rfl: 3 .  tamsulosin (FLOMAX) 0.4 MG CAPS capsule, Take 1 capsule (0.4 mg total) by mouth daily., Disp: 90 capsule, Rfl: 0   Allergies  Allergen Reactions  . Enalapril Maleate Other (See Comments)    ROS Review of Systems  Constitutional: Positive for fatigue. Negative for fever.  HENT: Negative.   Respiratory: Positive for cough and shortness of breath. Negative for chest tightness and wheezing.   Cardiovascular: Negative for leg swelling.  Gastrointestinal: Negative for abdominal distention.  Genitourinary: Negative for dysuria.  Musculoskeletal: Negative for back pain.      Objective:    Physical Exam Vitals reviewed.  Constitutional:      Appearance: Normal appearance.  HENT:     Mouth/Throat:     Mouth: Mucous membranes are moist.  Eyes:     Pupils: Pupils are equal, round, and reactive to light.  Neck:     Vascular: No carotid bruit.  Cardiovascular:     Rate and Rhythm: Normal rate and regular rhythm.     Pulses: Normal pulses.     Heart sounds: Normal heart sounds.  Pulmonary:     Effort: Pulmonary effort is normal.     Breath sounds: Normal breath sounds.  Abdominal:     General: Bowel sounds are normal.     Palpations: Abdomen is soft. There is no hepatomegaly, splenomegaly or mass.     Tenderness: There is no abdominal tenderness.     Hernia: No hernia is present.  Musculoskeletal:     Cervical back: Neck supple.     Right lower leg: No edema.     Left lower leg: No edema.  Skin:    Findings: No rash.  Neurological:     Mental Status: He is alert and oriented to person, place, and time.     Motor: No weakness.  Psychiatric:        Mood and Affect: Mood normal.        Behavior: Behavior normal.     BP (!) 163/93   Pulse 86  Wt Readings from Last 3 Encounters:  11/27/19 228 lb 1.6 oz (103.5 kg)  10/04/19 235 lb (106.6 kg)  09/11/19 224 lb (101.6 kg)     Health Maintenance Due  Topic Date  Due  . HEMOGLOBIN A1C  Never done  . Hepatitis C Screening  Never done  . COVID-19 Vaccine (1) Never done  . FOOT EXAM  Never done  . OPHTHALMOLOGY EXAM  Never done  . TETANUS/TDAP  Never done  . COLONOSCOPY (  Pts 45-11yrs Insurance coverage will need to be confirmed)  Never done  . PNA vac Low Risk Adult (1 of 2 - PCV13) Never done    There are no preventive care reminders to display for this patient.  Lab Results  Component Value Date   TSH 7.19 (H) 09/27/2011   Lab Results  Component Value Date   WBC 12.9 (H) 10/22/2015   HGB 11.6 (L) 10/22/2015   HCT 34.7 (L) 10/22/2015   MCV 84.5 10/22/2015   PLT 155 10/22/2015   Lab Results  Component Value Date   NA 138 10/22/2015   K 3.3 (L) 10/22/2015   CO2 27 10/22/2015   GLUCOSE 125 (H) 10/22/2015   BUN 16 10/22/2015   CREATININE 1.25 (H) 10/22/2015   BILITOT 1.5 (H) 10/22/2015   ALKPHOS 73 10/22/2015   AST 47 (H) 10/22/2015   ALT 31 10/22/2015   PROT 7.3 10/22/2015   ALBUMIN 3.8 10/22/2015   CALCIUM 8.8 (L) 10/22/2015   ANIONGAP 4 (L) 10/22/2015   No results found for: CHOL No results found for: HDL No results found for: LDLCALC No results found for: TRIG No results found for: CHOLHDL No results found for: HGBA1C    Assessment & Plan:   Problem List Items Addressed This Visit      Cardiovascular and Mediastinum   Essential hypertension    Blood pressure is under control on amlodipine metoprolol has been discontinued because of the bradycardia.        Respiratory   Sleep apnea    Patient will be getting the sleep study done when he recovers from Covid.        Endocrine   Diabetes mellitus ([place])    Blood sugar is stable on Metformin.      Hypothyroidism    Patient need to continue taking levothyroxine as directed.        Genitourinary   CKD (chronic kidney disease) stage 3, GFR 30-59 ml/min (HCC)    Patient has acute kidney injury due to Covid which has resolved.        Other   COVID -  Primary    Patient was admitted in [place] with post Covid pneumonia he is recovering slowly.  His medicine was reviewed with him.  He was advised to stop taking Metroprolol.      Relevant Orders   POC COVID-19 (Completed)      No orders of the defined types were placed in this encounter.   Follow-up: No follow-ups on file.    Cletis Athens, MD

## 2020-02-19 NOTE — Assessment & Plan Note (Signed)
Patient will be getting the sleep study done when he recovers from Covid.

## 2020-03-05 ENCOUNTER — Ambulatory Visit: Payer: Medicare PPO | Admitting: Internal Medicine

## 2020-03-14 ENCOUNTER — Ambulatory Visit: Payer: Medicare PPO | Admitting: Family Medicine

## 2020-03-16 DIAGNOSIS — U071 COVID-19: Secondary | ICD-10-CM | POA: Diagnosis not present

## 2020-03-20 ENCOUNTER — Other Ambulatory Visit: Payer: Self-pay | Admitting: *Deleted

## 2020-03-20 ENCOUNTER — Other Ambulatory Visit: Payer: Self-pay | Admitting: Internal Medicine

## 2020-03-20 MED ORDER — LISINOPRIL-HYDROCHLOROTHIAZIDE 20-12.5 MG PO TABS: 1.0000 | ORAL_TABLET | Freq: Every day | ORAL | 0 refills | Status: DC

## 2020-03-24 ENCOUNTER — Ambulatory Visit: Payer: Medicare PPO | Admitting: Internal Medicine

## 2020-03-25 ENCOUNTER — Ambulatory Visit (INDEPENDENT_AMBULATORY_CARE_PROVIDER_SITE_OTHER): Payer: Medicare PPO | Admitting: Internal Medicine

## 2020-03-25 ENCOUNTER — Other Ambulatory Visit: Payer: Self-pay

## 2020-03-25 ENCOUNTER — Encounter: Payer: Self-pay | Admitting: Internal Medicine

## 2020-03-25 VITALS — BP 173/95 | HR 79 | Ht 73.0 in | Wt 218.4 lb

## 2020-03-25 DIAGNOSIS — E119 Type 2 diabetes mellitus without complications: Secondary | ICD-10-CM | POA: Diagnosis not present

## 2020-03-25 DIAGNOSIS — G4734 Idiopathic sleep related nonobstructive alveolar hypoventilation: Secondary | ICD-10-CM | POA: Diagnosis not present

## 2020-03-25 DIAGNOSIS — I10 Essential (primary) hypertension: Secondary | ICD-10-CM | POA: Diagnosis not present

## 2020-03-25 DIAGNOSIS — E039 Hypothyroidism, unspecified: Secondary | ICD-10-CM | POA: Diagnosis not present

## 2020-03-25 MED ORDER — METFORMIN HCL 500 MG PO TABS: 500.0000 mg | ORAL_TABLET | Freq: Two times a day (BID) | ORAL | 3 refills | Status: DC

## 2020-03-25 NOTE — Progress Notes (Signed)
Established Patient Office Visit  Subjective:  Patient ID: Paul Pfluger [name], male    DOB: 1945-07-10  Age: 75 y.o. MRN: [id number]  CC:  Chief Complaint  Patient presents with  . Hypertension    HPI  Paul Westrup [name] presents for general checkup.Patient has been treated for Covid recently he was admitted in [facility] right normal anticoagulation he also take Metformin.  He denies chest pain shortness of breath wheezing.  Patient ran out of his medication for diabetes is Metformin 500 mg twice a day cholesterol refilled.  Neuropathy is stable.  BPH is also stable.  He is back to work.  Past Medical History:  Diagnosis Date  . Acute prostatitis   . BPH (benign prostatic hyperplasia)   . Chronic kidney disease   . Dysplasia of prostate   . Erectile dysfunction   . HTN (hypertension)   . Hypogonadism in male   . Orchitis and epididymitis   . Over weight   . Palindromic rheumatism, hand   . Prostatitis   . Rectum pain   . Testicle swelling   . Testicle tenderness   . Testicular mass     Past Surgical History:  Procedure Laterality Date  . BACK SURGERY    . head surgery     from fall  drilled hole in brain to relieve pressure    Family History  Problem Relation Age of Onset  . Cancer Maternal Aunt   . Prostate cancer Neg Hx   . Kidney disease Neg Hx   . Kidney cancer Neg Hx   . Bladder Cancer Neg Hx     Social History   Socioeconomic History  . Marital status: Legally Separated    Spouse name: Not on file  . Number of children: Not on file  . Years of education: Not on file  . Highest education level: Not on file  Occupational History  . Not on file  Tobacco Use  . Smoking status: Former Research scientist (life sciences)  . Smokeless tobacco: Never Used  . Tobacco comment: quit 30 years ago  Vaping Use  . Vaping Use: Never used  Substance and Sexual Activity  . Alcohol use: No    Alcohol/week: 0.0 standard drinks  . Drug use: No  . Sexual activity: Not on  file  Other Topics Concern  . Not on file  Social History Narrative  . Not on file   Social Determinants of Health   Financial Resource Strain: Not on file  Food Insecurity: Not on file  Transportation Needs: Not on file  Physical Activity: Not on file  Stress: Not on file  Social Connections: Not on file  Intimate Partner Violence: Not on file     Current Outpatient Medications:  .  Alcohol Swabs (B-D SINGLE USE SWABS REGULAR) PADS, 100 each by Does not apply route daily., Disp: 100 each, Rfl: 6 .  amLODipine (NORVASC) 10 MG tablet, Take 1 tablet (10 mg total) by mouth daily., Disp: 90 tablet, Rfl: 0 .  Blood Glucose Calibration (TRUE METRIX LEVEL 1) Low SOLN, 1 Bottle by In Vitro route daily., Disp: 1 each, Rfl: 6 .  Blood Glucose Monitoring Suppl (TRUE METRIX METER) DEVI, Check blood sugar daily, Disp: 1 each, Rfl: 3 .  doxycycline (VIBRAMYCIN) 100 MG capsule, Take 100 mg by mouth daily., Disp: , Rfl:  .  glucose blood (TRUE METRIX PRO BLOOD GLUCOSE) test strip, Use as instructed, Disp: 300 each, Rfl: 3 .  Lancets 33G MISC, Check  blood sugar daily, Disp: 300 each, Rfl: 3 .  levothyroxine (SYNTHROID) 175 MCG tablet, TAKE 1 TABLET EVERY DAY, Disp: 90 tablet, Rfl: 0 .  lisinopril-hydrochlorothiazide (ZESTORETIC) 20-12.5 MG tablet, Take 1 tablet by mouth daily., Disp: 90 tablet, Rfl: 0 .  metFORMIN (GLUCOPHAGE) 500 MG tablet, Take 1 tablet (500 mg total) by mouth 2 (two) times daily with a meal., Disp: 180 tablet, Rfl: 3 .  omeprazole (PRILOSEC) 20 MG capsule, Take 1 capsule (20 mg total) by mouth daily., Disp: 90 capsule, Rfl: 0 .  oxyCODONE-acetaminophen (PERCOCET) 7.5-325 MG tablet, , Disp: , Rfl:  .  rosuvastatin (CRESTOR) 10 MG tablet, Take 1 tablet (10 mg total) by mouth daily., Disp: 90 tablet, Rfl: 0 .  tadalafil (CIALIS) 5 MG tablet, Take 1 tablet (5 mg total) by mouth daily as needed for erectile dysfunction., Disp: 90 tablet, Rfl: 3 .  tamsulosin (FLOMAX) 0.4 MG CAPS  capsule, Take 1 capsule (0.4 mg total) by mouth daily., Disp: 90 capsule, Rfl: 0   Allergies  Allergen Reactions  . Enalapril Maleate Other (See Comments)    ROS Review of Systems  Constitutional: Negative.   HENT: Negative.   Eyes: Negative.   Respiratory: Negative.   Cardiovascular: Negative.   Gastrointestinal: Negative.   Endocrine: Negative.   Genitourinary: Negative.   Musculoskeletal: Negative.   Skin: Negative.   Allergic/Immunologic: Negative.   Neurological: Negative.   Hematological: Negative.   Psychiatric/Behavioral: Negative.   All other systems reviewed and are negative.     Objective:    Physical Exam Musculoskeletal:        General: No swelling or deformity.     Right lower leg: No edema.  Skin:    Coloration: Skin is not pale.  Neurological:     General: No focal deficit present.  Psychiatric:        Mood and Affect: Mood normal.        Thought Content: Thought content normal.   patient is a well-nourished in no acute distress HEENT examination is normal neck is supple.  Jugular venous pressure is not elevated Thyroid is not enlarged without any goiter or nodules. No lymphadenopathy is noted. Cardiovascular system examination revealed apical impulse to be palpable in the fifth intercostal space first heart sounds normal second heart sounds normal no murmur is audible.  On examination of the chest chest is clear on auscultation resonant on percussion.  Abdominal examination reveals abdomen to be soft nontender there is no hepatosplenomegaly bowel sounds are audible no tenderness rigidity or rebound tenderness noted. There is no calf tenderness or pedal edema. Neurological examination is un remarkable psychiatric examination is unremarkable. Locomotor system examination is unremarkable BP (!) 173/95   Pulse 79   Ht 6\' 1"  (1.854 m)   Wt 218 lb 6.4 oz (99.1 kg)   BMI 28.81 kg/m  Wt Readings from Last 3 Encounters:  03/25/20 218 lb 6.4 oz (99.1  kg)  11/27/19 228 lb 1.6 oz (103.5 kg)  10/04/19 235 lb (106.6 kg)     Health Maintenance Due  Topic Date Due  . HEMOGLOBIN A1C  Never done  . Hepatitis C Screening  Never done  . COVID-19 Vaccine (1) Never done  . FOOT EXAM  Never done  . OPHTHALMOLOGY EXAM  Never done  . TETANUS/TDAP  Never done  . COLONOSCOPY (Pts 45-45yrs Insurance coverage will need to be confirmed)  Never done  . PNA vac Low Risk Adult (1 of 2 - PCV13) Never done  There are no preventive care reminders to display for this patient.  Lab Results  Component Value Date   TSH 7.19 (H) 09/27/2011   Lab Results  Component Value Date   WBC 12.9 (H) 10/22/2015   HGB 11.6 (L) 10/22/2015   HCT 34.7 (L) 10/22/2015   MCV 84.5 10/22/2015   PLT 155 10/22/2015   Lab Results  Component Value Date   NA 138 10/22/2015   K 3.3 (L) 10/22/2015   CO2 27 10/22/2015   GLUCOSE 125 (H) 10/22/2015   BUN 16 10/22/2015   CREATININE 1.25 (H) 10/22/2015   BILITOT 1.5 (H) 10/22/2015   ALKPHOS 73 10/22/2015   AST 47 (H) 10/22/2015   ALT 31 10/22/2015   PROT 7.3 10/22/2015   ALBUMIN 3.8 10/22/2015   CALCIUM 8.8 (L) 10/22/2015   ANIONGAP 4 (L) 10/22/2015   No results found for: CHOL No results found for: HDL No results found for: LDLCALC No results found for: TRIG No results found for: CHOLHDL No results found for: HGBA1C    Assessment & Plan:   Problem List Items Addressed This Visit      Cardiovascular and Mediastinum   Essential hypertension    Patient blood pressure is normal patient denies any chest pain or shortness of breath there is no history of palpitation paroxysmal nocturnal dyspnea patient can walk 2 blocks without any problem patient was advised to follow low-salt low-cholesterol diet        Respiratory   Idiopathic sleep related nonobstructive alveolar hypoventilation    Patient has disordered breathing.  We will set him up for sleep apnea test.        Endocrine   Diabetes mellitus ([place]) -  Primary    - The patient's blood sugar is under control on metformin. - The patient will continue the current treatment regimen.  - I encouraged the patient to regularly check blood sugar.  - I encouraged the patient to monitor diet. I encouraged the patient to eat low-carb and low-sugar to help prevent blood sugar spikes.  - I encouraged the patient to continue following their prescribed treatment plan for diabetes - I informed the patient to get help if blood sugar drops below 54mg /dL, or if suddenly have trouble thinking clearly or breathing.          Relevant Medications   metFORMIN (GLUCOPHAGE) 500 MG tablet   Hypothyroidism    Patient was advised to continue taking his levothyroxine.         Meds ordered this encounter  Medications  . metFORMIN (GLUCOPHAGE) 500 MG tablet    Sig: Take 1 tablet (500 mg total) by mouth 2 (two) times daily with a meal.    Dispense:  180 tablet    Refill:  3    Follow-up: No follow-ups on file.    Cletis Athens, MD

## 2020-03-25 NOTE — Assessment & Plan Note (Signed)
Patient has disordered breathing.  We will set him up for sleep apnea test.

## 2020-03-25 NOTE — Assessment & Plan Note (Signed)
Patient blood pressure is normal patient denies any chest pain or shortness of breath there is no history of palpitation paroxysmal nocturnal dyspnea patient can walk 2 blocks without any problem patient was advised to follow low-salt low-cholesterol diet 

## 2020-03-25 NOTE — Assessment & Plan Note (Signed)
Patient was advised to continue taking his levothyroxine.

## 2020-03-25 NOTE — Assessment & Plan Note (Signed)
-   The patient's blood sugar is under control on metformin. - The patient will continue the current treatment regimen.  - I encouraged the patient to regularly check blood sugar.  - I encouraged the patient to monitor diet. I encouraged the patient to eat low-carb and low-sugar to help prevent blood sugar spikes.  - I encouraged the patient to continue following their prescribed treatment plan for diabetes - I informed the patient to get help if blood sugar drops below 54mg/dL, or if suddenly have trouble thinking clearly or breathing.     

## 2020-03-28 ENCOUNTER — Other Ambulatory Visit: Payer: Self-pay | Admitting: *Deleted

## 2020-03-28 DIAGNOSIS — N400 Enlarged prostate without lower urinary tract symptoms: Secondary | ICD-10-CM

## 2020-03-28 DIAGNOSIS — E119 Type 2 diabetes mellitus without complications: Secondary | ICD-10-CM

## 2020-03-28 DIAGNOSIS — K219 Gastro-esophageal reflux disease without esophagitis: Secondary | ICD-10-CM

## 2020-03-28 MED ORDER — METFORMIN HCL 500 MG PO TABS: 500.0000 mg | ORAL_TABLET | Freq: Two times a day (BID) | ORAL | 3 refills | Status: DC

## 2020-03-28 MED ORDER — TAMSULOSIN HCL 0.4 MG PO CAPS: 0.4000 mg | ORAL_CAPSULE | Freq: Every day | ORAL | 3 refills | Status: DC

## 2020-03-28 MED ORDER — OMEPRAZOLE 20 MG PO CPDR: 20.0000 mg | DELAYED_RELEASE_CAPSULE | Freq: Every day | ORAL | 3 refills | Status: DC

## 2020-04-13 DIAGNOSIS — U071 COVID-19: Secondary | ICD-10-CM | POA: Diagnosis not present

## 2020-04-14 ENCOUNTER — Encounter (INDEPENDENT_AMBULATORY_CARE_PROVIDER_SITE_OTHER): Payer: Medicare PPO | Admitting: Internal Medicine

## 2020-04-14 DIAGNOSIS — G4733 Obstructive sleep apnea (adult) (pediatric): Secondary | ICD-10-CM | POA: Diagnosis not present

## 2020-04-15 NOTE — Procedures (Signed)
[place] Report Part I                                                               Phone: [phone] Fax: [phone]  Patient Name: Buell, Parcel Acquisition Number: [id number]  Date of Birth: May 05, 1945 Acquisition Date: 04/14/2020  Referring Physician: Cletis Athens, MD     History: The patient is a 75 year old male who was referred for evaluation of possible sleep apnea. Medical History: hypertension, chronic kidney disease, obesity, diabetes.  Medications: Norvasc,  Vibramycin,   Synthroid, Zestoretic, Glucophage, Prilosec, Percocet, Crestor, Cialis, Flomax.  Procedure: This routine overnight polysomnogram was performed on the [facility] 5 using the standard diagnostic protocol. This included 6 channels of EEG, 2 channels of EOG, chin EMG, bilateral anterior tibialis EMG, nasal/oral thermistor, PTAF (nasal pressure transducer), chest and abdominal wall movements, EKG, and pulse oximetry.  Description: The total recording time was 420.9 minutes. The total sleep time was 268.5 minutes. There were a total of 149.4 minutes of wakefulness after sleep onset for a reduced????sleep efficiency of 63.8%. The latency to sleep onset was short? at 3.0 minutes. The R sleep onset latency was ???short at 47.0 minutes.??? Sleep parameters, as a percentage of the total sleep time, demonstrated 5.0% of sleep was in N1 sleep, 69.5% N2, 5.0% N3 and 20.5% R sleep. There were a total of 66 arousals for an arousal index of 14.7 arousals per hour of sleep that was ??slightly elevated.?  Respiratory monitoring demonstrated frequent mild degree of snoring in all positions. There were 87 apneas and hypopneas for an Apnea Hypopnea Index of 19.4 apneas and hypopneas per hour of sleep. The REM related apnea hypopnea index was 44.7/hr of REM sleep compared to a NREM AHI of 12.6/hr. The Respiratory Disturbance Index, which includes 2 respiratory effort related arousals (RERAs), was  19.9 respiratory events per hour of sleep.  The average duration of the respiratory events was 24.2 seconds with a maximum duration of 40.5 seconds. The respiratory events occurred mostly in the supine position with an AHI of 23.5. The respiratory events were associated with peripheral oxygen desaturations on the average to 90%. The lowest oxygen desaturation associated with a respiratory event was 74%. Additionally, the baseline oxygen saturation during wakefulness was 96%, during NREM sleep averaged 95%, and during REM sleep averaged 95%. The total duration of oxygen < 90% was 5.8 minutes and <80% was 0.3 minutes.  Cardiac monitoring- did not demonstrate transient cardiac decelerations associated with the apneas. Occasional PVCs were observed.   Periodic limb movement monitoring- demonstrated that there were 442 periodic limb movements for a periodic limb movement index of 98.8 periodic limb movements per hour of sleep. Frequent, quasi-periodic limb movements were observed during periods of wakefulness.  Impression: ???This routine overnight polysomnogram demonstrated significant obstructive sleep apnea with an overall Apnea Hypopnea Index of 19.4 apneas and hypopneas per hour of sleep, which increased to 44.in REM and 23.5 in supine sleep.  There was a highly elevated periodic limb movement index of 98.8 periodic limb movements per hour of sleep. In addition, frequent, quasi-periodic limb movements were observed during periods of wakefulness. The patient reports symptoms suggestive of restless leg syndrome. Sometimes these limb movements subside once  the apnea is controlled. Clinical correlation is suggested.  There was a reduced sleep efficiency with a?slightly elevated arousal index,??increased awakenings?and a reduced percentage of slow  wave sleep.????These findings would appear to be due to the combination of obstructive sleep apnea and periodic limb movements.   ??Recommendations:    1. A CPAP  titration would be recommended due to the severity of the sleep apnea. Some supine sleep should be ensured to optimize the titration. 2. Additionally, would recommend weight loss in a patient with a BMI of 29.0.    Allyne Gee, MD, [facility] Diplomate ABMS-Pulmonary, Critical Care and Sleep Medicine  Electronically reviewed and digitally signed    [place] Report Part II  Phone: [phone] Fax: [phone]  Patient last name Gell Neck Size 15.5 in. Acquisition [phone]  Patient first name Avrey Weight 220.0 lbs. Started 04/14/2020 at 9:31:58 PM  Birth date February 03, 1945 Height 73.0 in. Stopped 04/15/2020 at 4:40:52 AM  Age 86 BMI 29.0 lb/in2 Duration 420.9  Study Type Adult      Report generated by: Adriana Mccallum, RPSGT Sleep Data: Lights Out: 9:35:28 PM Sleep Onset: 9:38:28 PM  Lights On: 4:36:22 AM Sleep Efficiency: 63.8 %  Total Recording Time: 420.9 min Sleep Latency (from Lights Off) 3.0 min  Total Sleep Time (TST): 268.5 min R Latency (from Sleep Onset): 47.0 min  Sleep Period Time: 416.5 min Total number of awakenings: 20  Wake during sleep: 148.0 min Wake After Sleep Onset (WASO): 149.4 min   Sleep Data:         Arousal Summary: Stage  Latency from lights out (min) Latency from sleep onset (min) Duration (min) % Total Sleep Time  Normal values  N 1 3.0 0.0 13.5 5.0 (5%)  N 2 3.5 0.5 186.5 69.5 (50%)  N 3 28.0 25.0 13.5 5.0 (20%)  R 50.0 47.0 55.0 20.5 (25%)    Number Index  Spontaneous 23 5.1  Apneas & Hypopneas 27 6.0  RERAs 2 0.4       (Apneas & Hypopneas & RERAs)  (29) (6.5)  Limb Movement 59 13.2  Snore 0 0.0  TOTAL 111 24.8      Respiratory Data:  CA OA MA Apnea Hypopnea* A+ H RERA Total  Number 0 9 0 9 78 87 2 89  Mean Dur (sec) 0.0 15.5 0.0 15.5 25.2 24.2 23.5 24.2  Max Dur (sec) 0.0 20.0 0.0 20.0 40.5 40.5 27.0 40.5  Total Dur (min) 0.0 2.3 0.0 2.3 32.8 35.1 0.8 35.9  % of TST 0.0 0.9 0.0 0.9 12.2 13.1 0.3 13.4   Index (#/h TST) 0.0 2.0 0.0 2.0 17.4 19.4 0.4 19.9  *Hypopneas scored based on 4% or greater desaturation.  Sleep Stage:        REM NREM TST  AHI 44.7 12.6 19.4  RDI 44.7 13.2 19.9            Body Position Data:  Sleep (min) TST (%) REM (min) NREM (min) CA (#) OA (#) MA (#) HYP (#) AHI (#/h) RERA (#) RDI (#/h) Desat (#)  Supine 214.6 79.93 55.0 159.6 0 9 0 75 23.5 2 24.0 90  Non-Supine 53.90 20.07 0.00 53.90 0.00 0.00 0.00 3.00 3.34 0 3.34 4.00  Left: 53.9 20.07 0.0 53.9 0 0 0 3 3.3 0 3.3 4  UP: 0.0 0.00 0.0 0.0 0 0 0 0 0.0 0 0.00 0     Snoring: Total number of snoring episodes  0  Total  time with snoring    min (   % of sleep)   Oximetry Distribution:             WK REM NREM TOTAL  Average (%)   96 95 95 96  < 90% 2.1 3.4 0.3 5.8  < 80% 0.3 0.0 0.0 0.3  < 70% 0.3 0.0 0.0 0.3  # of Desaturations* 2 41 51 94  Desat Index (#/hour) 0.8 44.7 14.3 21.0  Desat Max (%) 23 11 18 23   Desat Max Dur (sec) 24.0 50.0 66.0 66.0  Approx Min O2 during sleep 76  Approx min O2 during a respiratory event 74  Was Oxygen added (Y/N) and final rate No:   0 LPM  *Desaturations based on 4% or greater drop from baseline.   Cheyne Stokes Breathing: None Present   Hypoventilation: None Present    Heart Rate Summary:  Average Heart Rate During Sleep 60.8 bpm      Highest Heart Rate During Sleep (95th %) 72.0 bpm      Highest Heart Rate During Sleep 185 bpm (artifact)  Highest Heart Rate During Recording (TIB) 185 bpm (artifact)   Heart Rate Observations: Event Type # Events   Bradycardia 0 Lowest HR Scored: N/A  Sinus Tachycardia During Sleep 0 Highest HR Scored: N/A  Narrow Complex Tachycardia 0 Highest HR Scored: N/A  Wide Complex Tachycardia 0 Highest HR Scored: N/A  Asystole 0 Longest Pause: N/A  Atrial Fibrillation 0 Duration Longest Event: N/A  Other Arrythmias  No Type:    Periodic Limb Movement Data: (Primary legs unless otherwise noted) Total # Limb  Movement 460 Limb Movement Index 102.8  Total # PLMS 442 PLMS Index 98.8  Total # PLMS Arousals 52 PLMS Arousal Index 11.6  Percentage Sleep Time with PLMS 189.63min (70.4 % sleep)  Mean Duration limb movements (secs) 708.6

## 2020-04-16 DIAGNOSIS — G894 Chronic pain syndrome: Secondary | ICD-10-CM | POA: Diagnosis not present

## 2020-04-16 DIAGNOSIS — M5416 Radiculopathy, lumbar region: Secondary | ICD-10-CM | POA: Diagnosis not present

## 2020-04-16 DIAGNOSIS — Z79899 Other long term (current) drug therapy: Secondary | ICD-10-CM | POA: Diagnosis not present

## 2020-04-16 DIAGNOSIS — Z79891 Long term (current) use of opiate analgesic: Secondary | ICD-10-CM | POA: Diagnosis not present

## 2020-04-16 DIAGNOSIS — M25519 Pain in unspecified shoulder: Secondary | ICD-10-CM | POA: Diagnosis not present

## 2020-04-17 ENCOUNTER — Encounter (INDEPENDENT_AMBULATORY_CARE_PROVIDER_SITE_OTHER): Payer: Medicare PPO | Admitting: Internal Medicine

## 2020-04-17 DIAGNOSIS — G4733 Obstructive sleep apnea (adult) (pediatric): Secondary | ICD-10-CM | POA: Diagnosis not present

## 2020-04-22 NOTE — Procedures (Signed)
[place] Report Part I  Phone: [phone] Fax: [phone]  Patient Name: Paul Briggs, Folks Acquisition Number: [id number]  Date of Birth: 29-Oct-1945 Acquisition Date: 04/17/2020  Referring Physician: Cletis Athens, MD     History: The patient is a 75 year old male with obstructive sleep apnea for CPAP titration. Medical History: hypertension, chronic kidney disease, obesity, diabetes.  Medications: Norvasc, Vibramycin, Synthroid, Zestoretic, Glucophage, Prilosec, Percocet, Crestor, Cialis, Flomax.  Procedure: This routine overnight polysomnogram was performed on the [facility] 5 using the standard CPAP protocol. This included 6 channels of EEG, 2 channels of EOG, chin EMG, bilateral anterior tibialis EMG, nasal/oral thermistor, PTAF (nasal pressure transducer), chest and abdominal wall movements, EKG, and pulse oximetry.  Description: The total recording time was 361.7 minutes. The total sleep time was 294.0 minutes. There were a total of 58.2 minutes of wakefulness after sleep onset for a?reduced?sleep efficiency of 81.3%. The latency to sleep onset was short?at 9.5 minutes. The R sleep onset latency was short at 57.5 minutes.??? Sleep parameters, as a percentage of the total sleep time, demonstrated 6.0% of sleep was in N1 sleep, 64.5% N2, 0.0% N3 and 29.6% R sleep. There were a total of 88 arousals for an arousal index of 18.0 arousals per hour of sleep that was ???elevated.  Overall, there were a total of 37 respiratory events for a respiratory disturbance index, which includes apneas, hypopneas and RERAs (increased respiratory effort) of 7.6 respiratory events per hour of sleep during the pressure titration. CPAP was initiated at 5 cm H2O at lights out, 11:26 p.m. It was titrated in 1-2 cm increments, largely for REM events to the final pressure of  10 cm H2O. Obstructive hypopneas were still noted in REM at this pressure although they  were managed at lower pressures in non-REM sleep.   Additionally, the baseline oxygen saturation during wakefulness was 96%, during NREM sleep averaged 96%, and during REM sleep averaged 96%. The total duration of oxygen < 90% was 5.1 minutes and <80% was 1.6 minutes.  Cardiac monitoring- occasional PVCs were noted.   Periodic limb movement monitoring- demonstrated that there were 596 periodic limb movements for a periodic limb movement index of 121.6 periodic limb movements per hour of sleep. Frequent, quasi-periodic limb movements were observed during periods of wakefulness.    Impression: This patient's obstructive sleep apnea demonstrated significant improvement with the utilization of nasal CPAP in non-REM sleep, however obstructive events persisted in REM.   There was a highly elevated periodic limb movement index of 121.6 periodic limb movements per hour of sleep. In addition, frequent, quasi-periodic limb movements were observed during periods of wakefulness. These limb movements were also observed during the initial polysomnogram. Treatment may be indicated if sleep disruption or sleepiness persist once the patient is fully compliant with PAP.  Recommendations: 1. Would recommend utilization of auto-adjusting CPAP at 5-15 cm H2O.      2. A [employer] Simplus medium mask was used. Chin strap used during study- no. Humidifier used during study- yes.     Allyne Gee, MD, [facility] Diplomate ABMS-Pulmonary, Critical Care and Sleep Medicine  Electronically reviewed and digitally signed     [facility] CPAP/BIPAP Polysomnogram Report Part II Phone: [phone] Fax: [phone]  Patient last name Briggs Neck Size 16.5 in. Acquisition [phone]  Patient first name Paul Weight 220.0 lbs. Started 04/17/2020 at  11:22:47 PM  Birth date [birth year] Height 73.0 in. Stopped 04/18/2020 at 5:30:05 AM  Age 27      Type Adult BMI 29.0 lb/in2 Duration 361.7  Report generated  by: Adriana Mccallum, RPSGT Sleep Data: Lights Out: 11:26:47 PM Sleep Onset: 11:36:17 PM  Lights On: 5:28:29 AM Sleep Efficiency: 81.3 %  Total Recording Time: 361.7 min Sleep Latency (from Lights Off) 9.5 min  Total Sleep Time (TST): 294.0 min R Latency (from Sleep Onset): 57.5 min  Sleep Period Time: 329.5 min Total number of awakenings: 25  Wake during sleep: 35.5 min Wake After Sleep Onset (WASO): 58.2 min   Sleep Data:         Arousal Summary: Stage  Latency from lights out (min) Latency from sleep onset (min) Duration (min) % Total Sleep Time  Normal values  N 1 9.5 0.0 17.5 6.0 (5%)  N 2 14.5 5.0 189.5 64.5 (50%)  N 3       0.0 0.0 (20%)  R 67.0 57.5 87.0 29.6 (25%)    Number Index  Spontaneous 36 7.3  Apneas & Hypopneas 14 2.9  RERAs 1 0.2       (Apneas & Hypopneas & RERAs)  (15) (3.1)  Limb Movement 77 15.7  Snore 0 0.0  TOTAL 128 26.1      Respiratory Data:  CA OA MA Apnea Hypopnea* A+ H RERA Total  Number 0 2 0 2 34 36 1 37  Mean Dur (sec) 0.0 19.5 0.0 19.5 30.5 29.9 23.0 29.7  Max Dur (sec) 0.0 22.5 0.0 22.5 45.5 45.5 23.0 45.5  Total Dur (min) 0.0 0.7 0.0 0.7 17.3 17.9 0.4 18.3  % of TST 0.0 0.2 0.0 0.2 5.9 6.1 0.1 6.2  Index (#/h TST) 0.0 0.4 0.0 0.4 6.9 7.3 0.2 7.6  *Hypopneas scored based on 4% or greater desaturation.  Sleep Stage:         REM NREM TST  AHI 17.2 2.9 7.3  RDI 17.9 2.9 7.6    Sleep (min) TST (%) REM (min) NREM (min) CA (#) OA (#) MA (#) HYP (#) AHI (#/h) RERA (#) RDI (#/h) Desat (#)  Supine 215.1 73.16 73.3 141.8 0 1 0 26 7.5 0 7.5 68  Non-Supine 78.90 26.84 13.70 65.20 0.00 1.00 0.00 8.00 6.84 1.00 7.60 34.00  Left: 30.2 10.27 6.2 24.0 0 1 0 6 13.9 1 15.9 10  Prone: 19.0 6.46 7.5 11.5 0 0 0 2 6.3 0 6.3 5  Right: 29.7 10.10 0.0 29.7 0 0 0 0 0.0 0 0.00 19     Snoring: Total number of snoring episodes  0  Total time with snoring    min (   % of sleep)   Oximetry Distribution:             WK REM NREM TOTAL  Average  (%)   96 96 96 96  < 90% 0.8 0.5 3.8 5.1  < 80% 0.7 0.0 0.9 1.6  < 70% 0.7 0.0 0.0 0.7  # of Desaturations* 6 50 46 102  Desat Index (#/hour) 6.2 34.7 13.4 20.9  Desat Max (%) 6 21 32 32  Desat Max Dur (sec) 42.0 86.0 120.0 120.0  Approx Min O2 during sleep 51  Approx min O2 during a respiratory event 78  Was Oxygen added (Y/N) and final rate No:   0 LPM  *Desaturations based on 3% or greater drop from baseline.   Cheyne Stokes Breathing: None Present  Heart Rate Summary:  Average Heart Rate During Sleep 56.1 bpm      Highest Heart Rate During Sleep (95th %) 64.0 bpm      Highest Heart Rate During Sleep 178 bpm (artifact)  Highest Heart Rate During Recording (TIB) 206 bpm (artifact)   Heart Rate Observations: Event Type # Events   Bradycardia 0 Lowest HR Scored: N/A  Sinus Tachycardia During Sleep 0 Highest HR Scored: N/A  Narrow Complex Tachycardia 0 Highest HR Scored: N/A  Wide Complex Tachycardia 0 Highest HR Scored: N/A  Asystole 0 Longest Pause: N/A  Atrial Fibrillation 0 Duration Longest Event: N/A  Other Arrythmias  No Type:   Periodic Limb Movement Data: (Primary legs unless otherwise noted) Total # Limb Movement 622 Limb Movement Index 126.9  Total # PLMS 596 PLMS Index 121.6  Total # PLMS Arousals 70 PLMS Arousal Index 14.3  Percentage Sleep Time with PLMS 214.8min (72.9 % sleep)  Mean Duration limb movements (secs) 988.8   IPAP Level (cmH2O) EPAP Level (cmH2O) Total Duration (min) Sleep Duration (min) Sleep (%) REM (%) CA  #) OA # MA # HYP #) AHI (#/hr) RERAs # RERAs (#/hr) RDI (#/hr)  5 5 166.4 143.3 86.1 29.4 0 2 0 18 8.4 0 0.0 8.4  6 6  51.3 48.8 95.1 3.7 0 0 0 5 6.1 0 0.0 6.1  8 8  81.1 79.6 98.2 33.4 0 0 0 8 6.0 0 0.0 6.0  10 10 25.4 21.4 84.3 33.1 0 0 0 3 8.4 1 2.8 11.2

## 2020-05-02 ENCOUNTER — Other Ambulatory Visit: Payer: Self-pay | Admitting: Internal Medicine

## 2020-05-05 ENCOUNTER — Ambulatory Visit (INDEPENDENT_AMBULATORY_CARE_PROVIDER_SITE_OTHER): Payer: Medicare PPO | Admitting: Internal Medicine

## 2020-05-05 VITALS — BP 165/85 | HR 86 | Temp 99.0°F | Resp 18 | Ht 73.0 in | Wt 222.0 lb

## 2020-05-05 DIAGNOSIS — G4733 Obstructive sleep apnea (adult) (pediatric): Secondary | ICD-10-CM | POA: Diagnosis not present

## 2020-05-05 DIAGNOSIS — I10 Essential (primary) hypertension: Secondary | ICD-10-CM | POA: Diagnosis not present

## 2020-05-05 DIAGNOSIS — E119 Type 2 diabetes mellitus without complications: Secondary | ICD-10-CM

## 2020-05-05 NOTE — Patient Instructions (Signed)
CPAP and BPAP Information CPAP and BPAP are methods that use air pressure to keep your airways open and to help you breathe well. CPAP and BPAP use different amounts of pressure. Your health care provider will tell you whether CPAP or BPAP would be more helpful for you.  CPAP stands for "continuous positive airway pressure." With CPAP, the amount of pressure stays the same while you breathe in and out.  BPAP stands for "bi-level positive airway pressure." With BPAP, the amount of pressure will be higher when you breathe in (inhale) and lower when you breathe out(exhale). This allows you to take larger breaths. CPAP or BPAP may be used in the hospital, or your health care provider may want you to use it at home. You may need to have a sleep study before your health care provider can order a machine for you to use at home. Why are CPAP and BPAP treatments used? CPAP or BPAP can be helpful if you have:  Sleep apnea.  Chronic obstructive pulmonary disease (COPD).  Heart failure.  Medical conditions that cause muscle weakness, including muscular dystrophy or amyotrophic lateral sclerosis (ALS).  Other problems that cause breathing to be shallow, weak, abnormal, or difficult. CPAP and BPAP are most commonly used for obstructive sleep apnea (OSA) to keep the airways from collapsing when the muscles relax during sleep. How is CPAP or BPAP administered? Both CPAP and BPAP are provided by a small machine with a flexible plastic tube that attaches to a plastic mask that you wear. Air is blown through the mask into your nose or mouth. The amount of pressure that is used to blow the air can be adjusted on the machine. Your health care provider will set the pressure setting and help you find the best mask for you. When should CPAP or BPAP be used? In most cases, the mask only needs to be worn during sleep. Generally, the mask needs to be worn throughout the night and during any daytime naps. People with  certain medical conditions may also need to wear the mask at other times when they are awake. Follow instructions from your health care provider about when to use the machine. What are some tips for using the mask?  Because the mask needs to be snug, some people feel trapped or closed-in (claustrophobic) when first using the mask. If you feel this way, you may need to get used to the mask. One way to do this is to hold the mask loosely over your nose or mouth and then gradually apply the mask more snugly. You can also gradually increase the amount of time that you use the mask.  Masks are available in various types and sizes. If your mask does not fit well, talk with your health care provider about getting a different one. Some common types of masks include: ? Full face masks, which fit over the mouth and nose. ? Nasal masks, which fit over the nose. ? Nasal pillow or prong masks, which fit into the nostrils.  If you are using a mask that fits over your nose and you tend to breathe through your mouth, a chin strap may be applied to help keep your mouth closed.  Some CPAP and BPAP machines have alarms that may sound if the mask comes off or develops a leak.  If you have trouble with the mask, it is very important that you talk with your health care provider about finding a way to make the mask easier to   tolerate. Do not stop using the mask. There could be a negative impact to your health if you stop using the mask.   What are some tips for using the machine?  Place your CPAP or BPAP machine on a secure table or stand near an electrical outlet.  Know where the on/off switch is on the machine.  Follow instructions from your health care provider about how to set the pressure on your machine and when you should use it.  Do not eat or drink while the CPAP or BPAP machine is on. Food or fluids could get pushed into your lungs by the pressure of the CPAP or BPAP.  For home use, CPAP and BPAP  machines can be rented or purchased through home health care companies. Many different brands of machines are available. Renting a machine before purchasing may help you find out which particular machine works well for you. Your insurance may also decide which machine you may get.  Keep the CPAP or BPAP machine and attachments clean. Ask your health care provider for specific instructions. Follow these instructions at home:  Do not use any products that contain nicotine or tobacco, such as cigarettes, e-cigarettes, and chewing tobacco. If you need help quitting, ask your health care provider.  Keep all follow-up visits as told by your health care provider. This is important. Contact a health care provider if:  You have redness or pressure sores on your head, face, mouth, or nose from the mask or head gear.  You have trouble using the CPAP or BPAP machine.  You cannot tolerate wearing the CPAP or BPAP mask.  Someone tells you that you snore even when wearing your CPAP or BPAP. Get help right away if:  You have trouble breathing.  You feel confused. Summary  CPAP and BPAP are methods that use air pressure to keep your airways open and to help you breathe well.  You may need to have a sleep study before your health care provider can order a machine for home use.  If you have trouble with the mask, it is very important that you talk with your health care provider about finding a way to make the mask easier to tolerate. Do not stop using the mask. There could be a negative impact to your health if you stop using the mask.  Follow instructions from your health care provider about when to use the machine. This information is not intended to replace advice given to you by your health care provider. Make sure you discuss any questions you have with your health care provider. Document Revised: 02/02/2019 Document Reviewed: 02/05/2019 [employer] Patient Education  2021 [employer]  

## 2020-05-05 NOTE — Progress Notes (Signed)
Sleep Medicine   Office Visit  Patient Name: Paul Briggs. DOB: [birth year] MRN [id number]    Chief Complaint: study results/sleep apnea  Brief History:  Paul Briggs  Was seen for initial consultation to discuss the results of his recent sleep study(ies) demonstrating moderate sleep apnea. APAP 5-15 cm H2O was recommended. He was referred for evaluation during a hospitalization for COVID. He states he was diagnosed with sleep apnea approximately 25 years ago but did not use the CPAP for long. The patient's bed partner reports  Snoring and apneas at night. The patient relates waking SOB. The patient's bed time is very irregular. He gets up between 7-9 a.m. He wakes frequently to urinate and for unknown reasons.  He does not feel refreshed with he wakes.   Patient has noted severe restlessness of his legs at night. He has not been given medication for this.  The patient  relates no unusual behavior during the night.  The patient reports a history of daytime sleepiness including when driving. The Epworth Sleepiness Score is 18 out of 24 .   Cardiovascular risk factors include: hypertension.    ROS  General: (-) fever, (-) chills, (-) night sweat Nose and Sinuses: (-) nasal stuffiness or itchiness, (-) postnasal drip, (-) nosebleeds, (-) sinus trouble. Mouth and Throat: (-) sore throat, (-) hoarseness. Neck: (-) swollen glands, (-) enlarged thyroid, (-) neck pain. Respiratory: - cough, - shortness of breath, - wheezing. Neurologic: he has numbness and tingling in feet due to diabetic neuropathy Psychiatric: - anxiety, - depression Sleep behavior: -sleep paralysis -hypnogogic hallucinations -dream enactment      -vivid dreams -cataplexy -night terrors -sleep walking   Current Medication: Outpatient Encounter Medications as of 05/05/2020  Medication Sig  . Alcohol Swabs (B-D SINGLE USE SWABS REGULAR) PADS 100 each by Does not apply route daily.  Marland Kitchen amLODipine (NORVASC) 10 MG tablet Take  1 tablet (10 mg total) by mouth daily.  . Blood Glucose Calibration (TRUE METRIX LEVEL 1) Low SOLN 1 Bottle by In Vitro route daily.  . Blood Glucose Monitoring Suppl (TRUE METRIX METER) DEVI Check blood sugar daily  . doxycycline (VIBRAMYCIN) 100 MG capsule Take 100 mg by mouth daily.  Marland Kitchen glucose blood (TRUE METRIX PRO BLOOD GLUCOSE) test strip Use as instructed  . Lancets 33G MISC Check blood sugar daily  . levothyroxine (SYNTHROID) 175 MCG tablet TAKE 1 TABLET EVERY DAY  . lisinopril-hydrochlorothiazide (ZESTORETIC) 20-12.5 MG tablet Take 1 tablet by mouth daily.  . metFORMIN (GLUCOPHAGE) 500 MG tablet Take 1 tablet (500 mg total) by mouth 2 (two) times daily with a meal.  . omeprazole (PRILOSEC) 20 MG capsule Take 1 capsule (20 mg total) by mouth daily.  Marland Kitchen oxyCODONE-acetaminophen (PERCOCET) 7.5-325 MG tablet   . rosuvastatin (CRESTOR) 10 MG tablet Take 1 tablet (10 mg total) by mouth daily.  . tadalafil (CIALIS) 5 MG tablet Take 1 tablet (5 mg total) by mouth daily as needed for erectile dysfunction.  . tamsulosin (FLOMAX) 0.4 MG CAPS capsule Take 1 capsule (0.4 mg total) by mouth daily.   No facility-administered encounter medications on file as of 05/05/2020.    Surgical History: Past Surgical History:  Procedure Laterality Date  . BACK SURGERY    . head surgery     from fall  drilled hole in brain to relieve pressure    Medical History: Past Medical History:  Diagnosis Date  . Acute prostatitis   . BPH (benign prostatic hyperplasia)   . Chronic kidney  disease   . Dysplasia of prostate   . Erectile dysfunction   . HTN (hypertension)   . Hypogonadism in male   . Orchitis and epididymitis   . Over weight   . Palindromic rheumatism, hand   . Prostatitis   . Rectum pain   . Testicle swelling   . Testicle tenderness   . Testicular mass     Family History: Non contributory to the present illness  Social History: Social History   Socioeconomic History  . Marital  status: Legally Separated    Spouse name: Not on file  . Number of children: Not on file  . Years of education: Not on file  . Highest education level: Not on file  Occupational History  . Not on file  Tobacco Use  . Smoking status: Former Research scientist (life sciences)  . Smokeless tobacco: Never Used  . Tobacco comment: quit 30 years ago  Vaping Use  . Vaping Use: Never used  Substance and Sexual Activity  . Alcohol use: No    Alcohol/week: 0.0 standard drinks  . Drug use: No  . Sexual activity: Not on file  Other Topics Concern  . Not on file  Social History Narrative  . Not on file   Social Determinants of Health   Financial Resource Strain: Not on file  Food Insecurity: Not on file  Transportation Needs: Not on file  Physical Activity: Not on file  Stress: Not on file  Social Connections: Not on file  Intimate Partner Violence: Not on file    Vital Signs: There were no vitals taken for this visit.  Examination: General Appearance: The patient is well-developed, well-nourished, and in no distress. Neck Circumference: 42 Skin: Gross inspection of skin unremarkable. Head: normocephalic, no gross deformities. Eyes: no gross deformities noted. ENT: ears appear grossly normal Neurologic: Alert and oriented. No involuntary movements.    EPWORTH SLEEPINESS SCALE:  Scale:  (0)= no chance of dozing; (1)= slight chance of dozing; (2)= moderate chance of dozing; (3)= high chance of dozing  Chance  Situtation    Sitting and reading: 3    Watching TV: 3    Sitting Inactive in public: 3    As a passenger in car: 3      Lying down to rest: 0    Sitting and talking: 2    Sitting quielty after lunch: 2    In a car, stopped in traffic: 2   TOTAL SCORE:   18 out of 24    SLEEP STUDIES:  1. PSG 04/14/20 - AHI overall 19.4, REM 44, supine 23.5,  Low SpO2 76%   LABS: Recent Results (from the past 2160 hour(s))  POC COVID-19     Status: Abnormal   Collection Time: 02/19/20   3:31 PM  Result Value Ref Range   SARS Coronavirus 2 Ag Positive (A) Negative    Radiology: MR LUMBAR SPINE WO CONTRAST  Result Date: 07/06/2018 CLINICAL DATA:  Initial evaluation for acute low back pain with left hip and buttock pain and left lower extremity numbness since fall 1 week ago. History of prior surgery. EXAM: MRI LUMBAR SPINE WITHOUT CONTRAST TECHNIQUE: Multiplanar, multisequence MR imaging of the lumbar spine was performed. No intravenous contrast was administered. COMPARISON:  Previous MRI from 03/28/2017. FINDINGS: Segmentation: Transitional lumbosacral anatomy. Lowest well-formed disc will be labeled L5-S1. Same numbering system employed as on previous exam. Alignment: 4 mm anterolisthesis of L4 on L5, stable. Trace retrolisthesis of L5 on S1. Appearance is stable from previous.  Vertebrae: Vertebral body height maintained without evidence for acute or chronic fracture. Bone marrow signal intensity diffusely decreased on T1 weighted imaging, most commonly related to anemia, smoking, or obesity. Superimposed 1 cm benign hemangioma within the L4 vertebral body. No worrisome osseous lesions. Mild reactive endplate changes noted about the right aspect of the L4-5 interspace. Conus medullaris and cauda equina: Conus extends to the L1-2 level. Conus medullaris within normal limits. 6 mm nodular density involving the nerve roots of the cauda equina at the level of L2-3 noted (series 5, image 7), slightly more prominent relative to 2015, but similar as compared to previous study from 2019. 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 Paraspinal and other soft tissues: Paraspinous soft tissues demonstrate no acute finding. Multiple scattered T2 hyperintense simple right renal cyst noted. Visualized visceral structures otherwise unremarkable. Disc levels: L1-2: Negative interspace. Mild bilateral facet hypertrophy. No canal or foraminal stenosis. No impingement. L2-3: Mild disc  desiccation with minimal annular disc bulge. Mild-to-moderate facet and ligament flavum hypertrophy. No significant canal or lateral recess stenosis. Foramina remain patent. L3-4: Chronic intervertebral disc space narrowing with diffuse disc bulge and disc desiccation. Disc bulging centric to the left. Moderate facet and ligament flavum hypertrophy with associated small bilateral joint effusions. Associated small synovial cyst noted at the posterior aspect of the L3-4 facets measuring up to 10 mm on the left and 9 mm on the right (series 8, image 20). Resultant severe canal with bilateral subarticular stenosis. Moderate bilateral L3 foraminal narrowing, left slightly worse than right. L4-5: 4 mm anterolisthesis. Associated broad posterior disc bulge with disc desiccation and intervertebral disc space narrowing. Superimposed right foraminal/far lateral component with slight caudad angulation is relatively similar to previous (series 8, image 21). There is a new/more prominent more focal left subarticular/foraminal component with slight caudad angulation (series 8, image 21). Associated annular fissure. This disc component contacts the exiting left L4 nerve root in the left neural foramen (series 5, image 13). Severe left with moderate to severe right lateral recess stenosis, slightly worsened from previous. Residual mild narrowing of the central canal relatively unchanged. Moderate bilateral L4 foraminal stenosis, slightly worsened on the left, similar on the right. L5-S1: Diffuse disc bulge with disc desiccation. Superimposed central disc protrusion with annular fissure. Previous left laminectomy. Mild to moderate facet hypertrophy. Residual moderate bilateral subarticular stenosis without narrowing of the central canal. Foramina remain patent. IMPRESSION: 1. Diffuse disc bulge at L4-5 with new/more prominent left subarticular/foraminal component, with worsened moderate left foraminal and severe left lateral recess  stenosis, potentially affecting either the left L4 or descending L5 nerve roots. Additional multifactorial degenerative changes at this level with resultant moderate to severe right lateral recess narrowing and moderate right L4 foraminal stenosis relatively stable. 2. Disc bulging with advanced facet degeneration at L3-4 with resultant severe canal with moderate bilateral L3 foraminal stenosis, similar to previous. 3. Disc bulge with small central disc protrusion at L5-S1 with resultant moderate bilateral subarticular stenosis, also similar to previous. 4. 6 mm nodular density involving the nerve roots of the cauda equina at the level of L2-3, similar to previous, but slightly more prominent as compared to 2015. 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 While this is almost certainly benign, further assessment with postcontrast imaging suggested for complete assessment. Electronically Signed   By: Jeannine Boga M.D.   On: 07/06/2018 23:44    No results found.  No results found.    Assessment and Plan:  Patient Active Problem List   Diagnosis Date Noted  . 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  . COVID 02/19/2020  . Headache above the eye region 09/11/2019  . Adrenal nodule ([place]) 10/28/2015  . Subdural hematoma ([place]) 10/24/2015  . Sepsis ([place]) 07/19/2015  . Essential hypertension 07/19/2015  . Diabetes mellitus ([place]) 07/19/2015  . Hypothyroidism 07/19/2015  . Peripheral neuropathy 07/19/2015  . Chronic back pain 07/19/2015  . CKD (chronic kidney disease) stage 3, GFR 30-59 ml/min ([facility]) 07/19/2015  . Enlarged prostate 03/25/2015  . Urine stream spraying 03/25/2015  . BPH with obstruction/lower urinary tract symptoms 09/18/2014  . Other male erectile dysfunction 09/18/2014  . Sleep apnea 04/04/2014    1. OSA (obstructive sleep apnea) The study results, diagnosis and treatment recommendations were discussed with the  patient Patient reports a history of  Previously diagnosed sleep apnea with snoring, witnessed apnea, disrupted sleep and daytime sleepiness Patient has comorbid cardiovascular risk factors including: hypertension. which could be exacerbated by pathologic sleep-disordered breathing. A trial of nasal APAP at 5-15 cm H2O is recommended. He has a previous diagnosis of RLS which is also untreated. As this may improve once the apnea is controlled, will follow up after he is on CPAP for a month or so.  OSA- APAP 5-15 cm H2O will be ordered. Follow up 30+days after set up.    2. Essential hypertension Elevated bp today. He is advised to monitor his bp and f/u with PCP. Hypertension Counseling:   The following hypertensive lifestyle modification were recommended and discussed:  1. Limiting alcohol intake to less than 1 oz/day of ethanol:(24 oz of beer or 8 oz of wine or 2 oz of 100-proof whiskey). 2. Take baby ASA 81 mg daily. 3. Importance of regular aerobic exercise and losing weight. 4. Reduce dietary saturated fat and cholesterol intake for overall cardiovascular health. 5. Maintaining adequate dietary potassium, calcium, and magnesium intake. 6. Regular monitoring of the blood pressure. 7. Reduce sodium intake to less than 100 mmol/day (less than 2.3 gm of sodium or less than 6 gm of sodium choride)   3. Type 2 diabetes mellitus without complication, without long-term current use of insulin ([place]) He is on metformin. Has been controlled per pt. Continue.     General Counseling: I have discussed the findings of the evaluation and examination with Arnell Sieving.  I have also discussed any further diagnostic evaluation thatmay be needed or ordered today. Albaraa verbalizes understanding of the findings of todays visit. We also reviewed his medications today and discussed drug interactions and side effects including but not limited excessive drowsiness and altered mental states. We also discussed that there is  always a risk not just to him but also people around him. he has been encouraged to call the office with any questions or concerns that should arise related to todays visit.  No orders of the defined types were placed in this encounter.       I have personally obtained a history, evaluated the patient, evaluated pertinent data, formulated the assessment and plan and placed orders.  This patient was seen today by Tressie Ellis, PA-C in collaboration with Dr. Devona Konig.  Richelle Ito Saunders Glance, PhD, FAASM  Diplomate, American Board of Sleep Medicine    Allyne Gee, MD [facility] Diplomate ABMS Pulmonary and Critical Care Medicine Sleep medicine

## 2020-05-14 DIAGNOSIS — U071 COVID-19: Secondary | ICD-10-CM | POA: Diagnosis not present

## 2020-05-15 ENCOUNTER — Ambulatory Visit: Payer: Medicare PPO | Admitting: Family Medicine

## 2020-05-16 DIAGNOSIS — M5416 Radiculopathy, lumbar region: Secondary | ICD-10-CM | POA: Diagnosis not present

## 2020-05-16 DIAGNOSIS — G894 Chronic pain syndrome: Secondary | ICD-10-CM | POA: Diagnosis not present

## 2020-05-16 DIAGNOSIS — Z79899 Other long term (current) drug therapy: Secondary | ICD-10-CM | POA: Diagnosis not present

## 2020-05-16 DIAGNOSIS — M25519 Pain in unspecified shoulder: Secondary | ICD-10-CM | POA: Diagnosis not present

## 2020-05-27 DIAGNOSIS — G4733 Obstructive sleep apnea (adult) (pediatric): Secondary | ICD-10-CM | POA: Diagnosis not present

## 2020-06-07 ENCOUNTER — Other Ambulatory Visit: Payer: Self-pay | Admitting: Internal Medicine

## 2020-06-07 DIAGNOSIS — K219 Gastro-esophageal reflux disease without esophagitis: Secondary | ICD-10-CM

## 2020-06-09 ENCOUNTER — Ambulatory Visit: Payer: Medicare PPO | Admitting: Internal Medicine

## 2020-06-10 ENCOUNTER — Other Ambulatory Visit: Payer: Self-pay

## 2020-06-10 ENCOUNTER — Ambulatory Visit (INDEPENDENT_AMBULATORY_CARE_PROVIDER_SITE_OTHER): Payer: Medicare PPO | Admitting: Internal Medicine

## 2020-06-10 ENCOUNTER — Ambulatory Visit: Payer: Medicare PPO | Admitting: Internal Medicine

## 2020-06-10 ENCOUNTER — Encounter: Payer: Self-pay | Admitting: Internal Medicine

## 2020-06-10 VITALS — BP 178/85 | HR 70 | Ht 74.0 in | Wt 213.6 lb

## 2020-06-10 DIAGNOSIS — E119 Type 2 diabetes mellitus without complications: Secondary | ICD-10-CM

## 2020-06-10 DIAGNOSIS — G4734 Idiopathic sleep related nonobstructive alveolar hypoventilation: Secondary | ICD-10-CM

## 2020-06-10 DIAGNOSIS — E039 Hypothyroidism, unspecified: Secondary | ICD-10-CM

## 2020-06-10 DIAGNOSIS — I10 Essential (primary) hypertension: Secondary | ICD-10-CM

## 2020-06-10 DIAGNOSIS — M67912 Unspecified disorder of synovium and tendon, left shoulder: Secondary | ICD-10-CM | POA: Insufficient documentation

## 2020-06-10 NOTE — Progress Notes (Signed)
Established Patient Office Visit  Subjective:  Patient ID: Paul Stukey [name], male    DOB: [birth year]  Age: 75 y.o. MRN: [id number]  CC:  Chief Complaint  Patient presents with  . Annual Exam    HPI  Paul Desena [name] presents for left shoulder pain.  He has had trouble raising his arm above the shoulder on the left side.  He denies any chest pain or shortness of breath.  Patient still coughing up.  He had a COVID infection he is doing well from that.  But does not want to get a COVID shot.  His blood pressure is high today and I told him to continue taking his medication on a regular basis.  We will see a urologist to check on his prostate. Past Medical History:  Diagnosis Date  . Acute prostatitis   . BPH (benign prostatic hyperplasia)   . Chronic kidney disease   . Dysplasia of prostate   . Erectile dysfunction   . HTN (hypertension)   . Hypogonadism in male   . Orchitis and epididymitis   . Over weight   . Palindromic rheumatism, hand   . Prostatitis   . Rectum pain   . Testicle swelling   . Testicle tenderness   . Testicular mass     Past Surgical History:  Procedure Laterality Date  . BACK SURGERY    . head surgery     from fall  drilled hole in brain to relieve pressure    Family History  Problem Relation Age of Onset  . Cancer Maternal Aunt   . Prostate cancer Neg Hx   . Kidney disease Neg Hx   . Kidney cancer Neg Hx   . Bladder Cancer Neg Hx     Social History   Socioeconomic History  . Marital status: Legally Separated    Spouse name: Not on file  . Number of children: Not on file  . Years of education: Not on file  . Highest education level: Not on file  Occupational History  . Not on file  Tobacco Use  . Smoking status: Former Research scientist (life sciences)  . Smokeless tobacco: Never Used  . Tobacco comment: quit 30 years ago  Vaping Use  . Vaping Use: Never used  Substance and Sexual Activity  . Alcohol use: No    Alcohol/week: 0.0 standard  drinks  . Drug use: No  . Sexual activity: Not on file  Other Topics Concern  . Not on file  Social History Narrative  . Not on file   Social Determinants of Health   Financial Resource Strain: Not on file  Food Insecurity: Not on file  Transportation Needs: Not on file  Physical Activity: Not on file  Stress: Not on file  Social Connections: Not on file  Intimate Partner Violence: Not on file     Current Outpatient Medications:  .  Alcohol Swabs (B-D SINGLE USE SWABS REGULAR) PADS, 100 each by Does not apply route daily., Disp: 100 each, Rfl: 6 .  amLODipine (NORVASC) 10 MG tablet, Take 1 tablet (10 mg total) by mouth daily., Disp: 90 tablet, Rfl: 0 .  amLODipine (NORVASC) 10 MG tablet, Take 1 tablet by mouth daily., Disp: , Rfl:  .  Blood Glucose Calibration (TRUE METRIX LEVEL 1) Low SOLN, 1 Bottle by In Vitro route daily., Disp: 1 each, Rfl: 6 .  Blood Glucose Monitoring Suppl (TRUE METRIX METER) DEVI, Check blood sugar daily, Disp: 1 each, Rfl: 3 .  doxycycline (VIBRAMYCIN) 100 MG capsule, Take 100 mg by mouth daily., Disp: , Rfl:  .  glucose blood (TRUE METRIX PRO BLOOD GLUCOSE) test strip, Use as instructed, Disp: 300 each, Rfl: 3 .  Lancets 33G MISC, Check blood sugar daily, Disp: 300 each, Rfl: 3 .  levothyroxine (SYNTHROID) 175 MCG tablet, TAKE 1 TABLET EVERY DAY, Disp: 90 tablet, Rfl: 0 .  levothyroxine (SYNTHROID) 175 MCG tablet, Take by mouth., Disp: , Rfl:  .  lisinopril-hydrochlorothiazide (ZESTORETIC) 20-12.5 MG tablet, Take 1 tablet by mouth daily., Disp: 90 tablet, Rfl: 0 .  lisinopril-hydrochlorothiazide (ZESTORETIC) 20-12.5 MG tablet, Take 2 tablets by mouth daily., Disp: , Rfl:  .  metFORMIN (GLUCOPHAGE) 1000 MG tablet, Take 1 tablet by mouth 2 (two) times daily., Disp: , Rfl:  .  metFORMIN (GLUCOPHAGE) 500 MG tablet, Take 1 tablet (500 mg total) by mouth 2 (two) times daily with a meal., Disp: 180 tablet, Rfl: 3 .  omeprazole (PRILOSEC) 20 MG capsule, TAKE 1  CAPSULE EVERY DAY, Disp: 90 capsule, Rfl: 3 .  oxyCODONE-acetaminophen (PERCOCET) 7.5-325 MG tablet, , Disp: , Rfl:  .  rosuvastatin (CRESTOR) 10 MG tablet, Take 1 tablet (10 mg total) by mouth daily., Disp: 90 tablet, Rfl: 0 .  rosuvastatin (CRESTOR) 10 MG tablet, Take by mouth., Disp: , Rfl:  .  tadalafil (CIALIS) 5 MG tablet, Take 1 tablet (5 mg total) by mouth daily as needed for erectile dysfunction., Disp: 90 tablet, Rfl: 3 .  tamsulosin (FLOMAX) 0.4 MG CAPS capsule, Take 1 capsule (0.4 mg total) by mouth daily., Disp: 90 capsule, Rfl: 3   Allergies  Allergen Reactions  . Enalapril Maleate Other (See Comments)  . Enalapril Maleate Other (See Comments) and Rash    ROS Review of Systems  Constitutional: Negative.   HENT: Negative.   Eyes: Negative.   Respiratory: Negative.   Cardiovascular: Negative.   Gastrointestinal: Negative.   Endocrine: Negative.   Genitourinary: Negative.   Musculoskeletal: Negative.        Complain of pain in the left shoulder.  Skin: Negative.   Allergic/Immunologic: Negative.   Neurological: Negative.   Hematological: Negative.   Psychiatric/Behavioral: Negative.   All other systems reviewed and are negative.     Objective:    Physical Exam Vitals reviewed.  Constitutional:      Appearance: Normal appearance.  HENT:     Mouth/Throat:     Mouth: Mucous membranes are moist.  Eyes:     Pupils: Pupils are equal, round, and reactive to light.  Neck:     Vascular: No carotid bruit.  Cardiovascular:     Rate and Rhythm: Normal rate and regular rhythm.     Pulses: Normal pulses.     Heart sounds: Normal heart sounds.  Pulmonary:     Effort: Pulmonary effort is normal.     Breath sounds: Normal breath sounds.  Abdominal:     General: Bowel sounds are normal.     Palpations: Abdomen is soft. There is no hepatomegaly, splenomegaly or mass.     Tenderness: There is no abdominal tenderness.     Hernia: No hernia is present.   Musculoskeletal:     Cervical back: Neck supple.     Right lower leg: No edema.     Left lower leg: No edema.     Comments: Patient cannot raise her left arm above the shoulder.  He was given a cortisone shot 40 mg in the subacromial bursa without any lidocaine.  Skin:  Findings: No rash.  Neurological:     Mental Status: He is alert and oriented to person, place, and time.     Motor: No weakness.  Psychiatric:        Mood and Affect: Mood normal.        Behavior: Behavior normal.     BP (!) 178/85   Pulse 70   Ht 6\' 2"  (1.88 m)   Wt 213 lb 9.6 oz (96.9 kg)   BMI 27.42 kg/m  Wt Readings from Last 3 Encounters:  06/10/20 213 lb 9.6 oz (96.9 kg)  05/05/20 222 lb (100.7 kg)  03/25/20 218 lb 6.4 oz (99.1 kg)     Health Maintenance Due  Topic Date Due  . HEMOGLOBIN A1C  Never done  . COVID-19 Vaccine (1) Never done  . FOOT EXAM  Never done  . OPHTHALMOLOGY EXAM  Never done  . Hepatitis C Screening  Never done  . TETANUS/TDAP  Never done  . COLONOSCOPY (Pts 45-19yrs Insurance coverage will need to be confirmed)  Never done  . PNA vac Low Risk Adult (1 of 2 - PCV13) Never done    There are no preventive care reminders to display for this patient.  Lab Results  Component Value Date   TSH 7.19 (H) 09/27/2011   Lab Results  Component Value Date   WBC 12.9 (H) 10/22/2015   HGB 11.6 (L) 10/22/2015   HCT 34.7 (L) 10/22/2015   MCV 84.5 10/22/2015   PLT 155 10/22/2015   Lab Results  Component Value Date   NA 138 10/22/2015   K 3.3 (L) 10/22/2015   CO2 27 10/22/2015   GLUCOSE 125 (H) 10/22/2015   BUN 16 10/22/2015   CREATININE 1.25 (H) 10/22/2015   BILITOT 1.5 (H) 10/22/2015   ALKPHOS 73 10/22/2015   AST 47 (H) 10/22/2015   ALT 31 10/22/2015   PROT 7.3 10/22/2015   ALBUMIN 3.8 10/22/2015   CALCIUM 8.8 (L) 10/22/2015   ANIONGAP 4 (L) 10/22/2015   No results found for: CHOL No results found for: HDL No results found for: LDLCALC No results found for:  TRIG No results found for: CHOLHDL No results found for: HGBA1C    Assessment & Plan:   Problem List Items Addressed This Visit      Cardiovascular and Mediastinum   Essential hypertension    Blood pressure is high today patient was told to go on a DASH diet.  He was also advised to lose weight.  Patient was advised not to drink any alcohol.  He was also advised not to use any NSAIDs.        Respiratory   Sleep apnea    Patient stated he is using his CPAP machine at he is feeling better.        Endocrine   Diabetes mellitus ([place]) - Primary    - The patient's blood sugar is under control on med. - The patient will continue the current treatment regimen.  - I encouraged the patient to regularly check blood sugar.  - I encouraged the patient to monitor diet. I encouraged the patient to eat low-carb and low-sugar to help prevent blood sugar spikes.  - I encouraged the patient to continue following their prescribed treatment plan for diabetes - I informed the patient to get help if blood sugar drops below 54mg /dL, or if suddenly have trouble thinking clearly or breathing.       Hypothyroidism    TSH is high and patient was  advised to take his thyroid medication as directed.        Musculoskeletal and Integument   Disorder of rotator cuff, left    Patient was given 40 mg of Kenalog and left left subacromial bursa.  He tolerated the procedure well.       Joint Injection/Arthrocentesis  Date/Time: 06/10/2020 10:04 AM Performed by: Cletis Athens, MD Authorized by: Cletis Athens, MD  Indications: pain  Body area: shoulder Joint: left shoulder Local anesthesia used: no  Anesthesia: Local anesthesia used: no  Sedation: Patient sedated: no  Preparation: Patient was prepped and draped in the usual sterile fashion. Needle size: 22 G Ultrasound guidance: no Approach: posterior Aspirate: clear Betamethasone amount: 40 mg Comments: Left shoulder was prepared with alcohol.   And under complete sterile condition, 40 mg of Kenalog was injected in the subacromial bursa on the left side.  Patient tolerated the procedure well.  He was advised to come back for further problems.  He was also educated on the left shoulder exercises.     No orders of the defined types were placed in this encounter.   Follow-up: No follow-ups on file.    Cletis Athens, MD

## 2020-06-10 NOTE — Assessment & Plan Note (Signed)
Patient was given 40 mg of Kenalog and left left subacromial bursa.  He tolerated the procedure well.

## 2020-06-10 NOTE — Assessment & Plan Note (Signed)
Blood pressure is high today patient was told to go on a DASH diet.  He was also advised to lose weight.  Patient was advised not to drink any alcohol.  He was also advised not to use any NSAIDs.

## 2020-06-10 NOTE — Assessment & Plan Note (Signed)
Patient stated he is using his CPAP machine at he is feeling better.

## 2020-06-10 NOTE — Assessment & Plan Note (Signed)
TSH is high and patient was advised to take his thyroid medication as directed.

## 2020-06-10 NOTE — Assessment & Plan Note (Signed)
-   The patient's blood sugar is under control on med. - The patient will continue the current treatment regimen.  - I encouraged the patient to regularly check blood sugar.  - I encouraged the patient to monitor diet. I encouraged the patient to eat low-carb and low-sugar to help prevent blood sugar spikes.  - I encouraged the patient to continue following their prescribed treatment plan for diabetes - I informed the patient to get help if blood sugar drops below 54mg/dL, or if suddenly have trouble thinking clearly or breathing.  

## 2020-06-13 DIAGNOSIS — U071 COVID-19: Secondary | ICD-10-CM | POA: Diagnosis not present

## 2020-06-16 DIAGNOSIS — Z79899 Other long term (current) drug therapy: Secondary | ICD-10-CM | POA: Diagnosis not present

## 2020-06-16 DIAGNOSIS — Z5181 Encounter for therapeutic drug level monitoring: Secondary | ICD-10-CM | POA: Diagnosis not present

## 2020-06-25 ENCOUNTER — Other Ambulatory Visit: Payer: Self-pay | Admitting: Internal Medicine

## 2020-06-27 DIAGNOSIS — G4733 Obstructive sleep apnea (adult) (pediatric): Secondary | ICD-10-CM | POA: Diagnosis not present

## 2020-06-28 ENCOUNTER — Other Ambulatory Visit: Payer: Self-pay | Admitting: Internal Medicine

## 2020-07-04 NOTE — Progress Notes (Signed)
Pt did not show for scheduled appointment. Office staff will reach out to reschedule.  

## 2020-07-07 ENCOUNTER — Ambulatory Visit: Payer: Medicare PPO | Admitting: Internal Medicine

## 2020-07-08 ENCOUNTER — Other Ambulatory Visit: Payer: Self-pay

## 2020-07-08 ENCOUNTER — Ambulatory Visit (INDEPENDENT_AMBULATORY_CARE_PROVIDER_SITE_OTHER): Payer: Medicare PPO | Admitting: Internal Medicine

## 2020-07-08 ENCOUNTER — Encounter: Payer: Self-pay | Admitting: Internal Medicine

## 2020-07-08 VITALS — BP 138/74 | HR 68 | Ht 73.0 in | Wt 220.3 lb

## 2020-07-08 DIAGNOSIS — G4734 Idiopathic sleep related nonobstructive alveolar hypoventilation: Secondary | ICD-10-CM | POA: Diagnosis not present

## 2020-07-08 DIAGNOSIS — M67912 Unspecified disorder of synovium and tendon, left shoulder: Secondary | ICD-10-CM

## 2020-07-08 DIAGNOSIS — S065XAA Traumatic subdural hemorrhage with loss of consciousness status unknown, initial encounter: Secondary | ICD-10-CM

## 2020-07-08 DIAGNOSIS — E278 Other specified disorders of adrenal gland: Secondary | ICD-10-CM

## 2020-07-08 DIAGNOSIS — E039 Hypothyroidism, unspecified: Secondary | ICD-10-CM | POA: Diagnosis not present

## 2020-07-08 DIAGNOSIS — I10 Essential (primary) hypertension: Secondary | ICD-10-CM | POA: Diagnosis not present

## 2020-07-08 DIAGNOSIS — E1169 Type 2 diabetes mellitus with other specified complication: Secondary | ICD-10-CM

## 2020-07-08 DIAGNOSIS — Z0000 Encounter for general adult medical examination without abnormal findings: Secondary | ICD-10-CM | POA: Diagnosis not present

## 2020-07-08 DIAGNOSIS — E119 Type 2 diabetes mellitus without complications: Secondary | ICD-10-CM

## 2020-07-08 DIAGNOSIS — N1831 Chronic kidney disease, stage 3a: Secondary | ICD-10-CM | POA: Diagnosis not present

## 2020-07-08 LAB — POCT GLYCOSYLATED HEMOGLOBIN (HGB A1C): Hemoglobin A1C: 7.1 % — AB (ref 4.0–5.6)

## 2020-07-08 LAB — GLUCOSE, POCT (MANUAL RESULT ENTRY): POC Glucose: 110 mg/dl — AB (ref 70–99)

## 2020-07-08 NOTE — Patient Instructions (Signed)

## 2020-07-08 NOTE — Assessment & Plan Note (Signed)
Patient takes his Levoxyl on a regular basis

## 2020-07-08 NOTE — Addendum Note (Signed)
Addended by: [place]son [place] R on: 07/08/2020 11:46 AM   Modules accepted: Orders

## 2020-07-08 NOTE — Assessment & Plan Note (Signed)
Patient denies any headache also, there is no evidence of dementia.

## 2020-07-08 NOTE — Assessment & Plan Note (Signed)
-   The patient's blood sugar is under control on med. - The patient will continue the current treatment regimen.  - I encouraged the patient to regularly check blood sugar.  - I encouraged the patient to monitor diet. I encouraged the patient to eat low-carb and low-sugar to help prevent blood sugar spikes.  - I encouraged the patient to continue following their prescribed treatment plan for diabetes - I informed the patient to get help if blood sugar drops below 54mg /dL, or if suddenly have trouble thinking clearly or breathing.  Patient was advised to buy a book on diabetes from a local bookstore or from [place]  Patient should read 2 chapters every day to keep the motivation going, this is in addition to some of the materials we provided them from the office.  There are other resources on the Internet like YouTube and wilkipedia to get an education on the diabetes patient will be referred to the eye doctor.  Also get hemoglobin AIC.  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

## 2020-07-08 NOTE — Assessment & Plan Note (Signed)
Patient is using his CPAP machine

## 2020-07-08 NOTE — Assessment & Plan Note (Signed)
We will get a CT scan done next year.

## 2020-07-08 NOTE — Assessment & Plan Note (Signed)
Patient was advised to control his diabetes

## 2020-07-08 NOTE — Assessment & Plan Note (Signed)
This is a chronic problem with stable vital

## 2020-07-08 NOTE — Progress Notes (Signed)
Established Patient Office Visit  Subjective:  Patient ID: Paul Poucher [name], male    DOB: 1945/10/31  Age: 75 y.o. MRN: [id number]  CC:  Chief Complaint  Patient presents with   Annual Exam    HPI  Paul Ramus [name] Present  for physical  Past Medical History:  Diagnosis Date   Acute prostatitis    BPH (benign prostatic hyperplasia)    Chronic kidney disease    Dysplasia of prostate    Erectile dysfunction    HTN (hypertension)    Hypogonadism in male    Orchitis and epididymitis    Over weight    Palindromic rheumatism, hand    Prostatitis    Rectum pain    Testicle swelling    Testicle tenderness    Testicular mass     Past Surgical History:  Procedure Laterality Date   BACK SURGERY     head surgery     from fall  drilled hole in brain to relieve pressure    Family History  Problem Relation Age of Onset   Cancer Maternal Aunt    Prostate cancer Neg Hx    Kidney disease Neg Hx    Kidney cancer Neg Hx    Bladder Cancer Neg Hx     Social History   Socioeconomic History   Marital status: Legally Separated    Spouse name: Not on file   Number of children: Not on file   Years of education: Not on file   Highest education level: Not on file  Occupational History   Not on file  Tobacco Use   Smoking status: Former    Pack years: 0.00   Smokeless tobacco: Never   Tobacco comments:    quit 30 years ago  Vaping Use   Vaping Use: Never used  Substance and Sexual Activity   Alcohol use: No    Alcohol/week: 0.0 standard drinks   Drug use: No   Sexual activity: Not on file  Other Topics Concern   Not on file  Social History Narrative   Not on file   Social Determinants of Health   Financial Resource Strain: Not on file  Food Insecurity: Not on file  Transportation Needs: Not on file  Physical Activity: Not on file  Stress: Not on file  Social Connections: Not on file  Intimate Partner Violence: Not on file     Current  Outpatient Medications:    Alcohol Swabs (B-D SINGLE USE SWABS REGULAR) PADS, 100 each by Does not apply route daily., Disp: 100 each, Rfl: 6   amLODipine (NORVASC) 10 MG tablet, Take 1 tablet (10 mg total) by mouth daily., Disp: 90 tablet, Rfl: 0   amLODipine (NORVASC) 10 MG tablet, Take 1 tablet by mouth daily., Disp: , Rfl:    Blood Glucose Calibration (TRUE METRIX LEVEL 1) Low SOLN, 1 Bottle by In Vitro route daily., Disp: 1 each, Rfl: 6   Blood Glucose Monitoring Suppl (TRUE METRIX METER) DEVI, Check blood sugar daily, Disp: 1 each, Rfl: 3   doxycycline (VIBRAMYCIN) 100 MG capsule, Take 100 mg by mouth daily., Disp: , Rfl:    glucose blood (TRUE METRIX PRO BLOOD GLUCOSE) test strip, Use as instructed, Disp: 300 each, Rfl: 3   Lancets 33G MISC, Check blood sugar daily, Disp: 300 each, Rfl: 3   levothyroxine (SYNTHROID) 175 MCG tablet, TAKE 1 TABLET EVERY DAY, Disp: 90 tablet, Rfl: 0   levothyroxine (SYNTHROID) 175 MCG tablet, Take by mouth., Disp: ,  Rfl:    lisinopril-hydrochlorothiazide (ZESTORETIC) 20-12.5 MG tablet, Take 1 tablet by mouth daily., Disp: 90 tablet, Rfl: 0   lisinopril-hydrochlorothiazide (ZESTORETIC) 20-12.5 MG tablet, TAKE 1 TABLET EVERY DAY, Disp: 90 tablet, Rfl: 3   lisinopril-hydrochlorothiazide (ZESTORETIC) 20-12.5 MG tablet, Take 1 tablet by mouth once daily, Disp: 90 tablet, Rfl: 0   metFORMIN (GLUCOPHAGE) 1000 MG tablet, Take 1 tablet by mouth 2 (two) times daily., Disp: , Rfl:    metFORMIN (GLUCOPHAGE) 500 MG tablet, Take 1 tablet (500 mg total) by mouth 2 (two) times daily with a meal., Disp: 180 tablet, Rfl: 3   omeprazole (PRILOSEC) 20 MG capsule, TAKE 1 CAPSULE EVERY DAY, Disp: 90 capsule, Rfl: 3   oxyCODONE-acetaminophen (PERCOCET) 7.5-325 MG tablet, , Disp: , Rfl:    rosuvastatin (CRESTOR) 10 MG tablet, Take 1 tablet (10 mg total) by mouth daily., Disp: 90 tablet, Rfl: 0   rosuvastatin (CRESTOR) 10 MG tablet, Take by mouth., Disp: , Rfl:    tadalafil (CIALIS)  5 MG tablet, Take 1 tablet (5 mg total) by mouth daily as needed for erectile dysfunction., Disp: 90 tablet, Rfl: 3   tamsulosin (FLOMAX) 0.4 MG CAPS capsule, Take 1 capsule (0.4 mg total) by mouth daily., Disp: 90 capsule, Rfl: 3   lisinopril-hydrochlorothiazide (ZESTORETIC) 20-12.5 MG tablet, Take 2 tablets by mouth daily., Disp: , Rfl:    Allergies  Allergen Reactions   Enalapril Maleate Other (See Comments)   Enalapril Maleate Other (See Comments) and Rash    ROS Review of Systems  Constitutional: Negative.   HENT: Negative.  Negative for dental problem.   Eyes: Negative.  Negative for redness.  Respiratory: Negative.  Negative for chest tightness.   Cardiovascular: Negative.  Negative for chest pain and palpitations.  Gastrointestinal: Negative.  Negative for abdominal distention.  Endocrine: Negative.   Genitourinary: Negative.  Negative for dysuria, frequency and hematuria.  Musculoskeletal: Negative.   Skin: Negative.   Allergic/Immunologic: Negative.   Neurological: Negative.  Negative for seizures.  Hematological: Negative.   Psychiatric/Behavioral: Negative.    All other systems reviewed and are negative.    Objective:    Physical Exam Vitals reviewed.  Constitutional:      Appearance: Normal appearance.  HENT:     Mouth/Throat:     Mouth: Mucous membranes are moist.  Eyes:     Pupils: Pupils are equal, round, and reactive to light.  Neck:     Vascular: No carotid bruit.  Cardiovascular:     Rate and Rhythm: Normal rate and regular rhythm.     Pulses: Normal pulses.     Heart sounds: Normal heart sounds.  Pulmonary:     Effort: Pulmonary effort is normal.     Breath sounds: Normal breath sounds.  Abdominal:     General: Bowel sounds are normal.     Palpations: Abdomen is soft. There is no hepatomegaly, splenomegaly or mass.     Tenderness: There is no abdominal tenderness.     Hernia: No hernia is present.  Genitourinary:    Testes:        Right:  Tenderness not present.        Left: Tenderness not present.     Comments: Prostate is normal Musculoskeletal:       Arms:     Cervical back: Neck supple.     Right lower leg: No edema.     Left lower leg: No edema.     Comments:  scar of the back surgery  Skin:  Findings: No rash.  Neurological:     Mental Status: He is alert and oriented to person, place, and time.     Motor: No weakness.  Psychiatric:        Mood and Affect: Mood normal.        Behavior: Behavior normal.    BP 138/74   Pulse 68   Ht 6\' 1"  (1.854 m)   Wt 220 lb 4.8 oz (99.9 kg)   BMI 29.07 kg/m  Wt Readings from Last 3 Encounters:  07/08/20 220 lb 4.8 oz (99.9 kg)  06/10/20 213 lb 9.6 oz (96.9 kg)  05/05/20 222 lb (100.7 kg)     Health Maintenance Due  Topic Date Due   HEMOGLOBIN A1C  Never done   COVID-19 Vaccine (1) Never done   FOOT EXAM  Never done   OPHTHALMOLOGY EXAM  Never done   Hepatitis C Screening  Never done   TETANUS/TDAP  Never done   Zoster Vaccines- Shingrix (1 of 2) Never done   COLONOSCOPY (Pts 45-54yrs Insurance coverage will need to be confirmed)  Never done   PNA vac Low Risk Adult (1 of 2 - PCV13) Never done    There are no preventive care reminders to display for this patient.  Lab Results  Component Value Date   TSH 7.19 (H) 09/27/2011   Lab Results  Component Value Date   WBC 12.9 (H) 10/22/2015   HGB 11.6 (L) 10/22/2015   HCT 34.7 (L) 10/22/2015   MCV 84.5 10/22/2015   PLT 155 10/22/2015   Lab Results  Component Value Date   NA 138 10/22/2015   K 3.3 (L) 10/22/2015   CO2 27 10/22/2015   GLUCOSE 125 (H) 10/22/2015   BUN 16 10/22/2015   CREATININE 1.25 (H) 10/22/2015   BILITOT 1.5 (H) 10/22/2015   ALKPHOS 73 10/22/2015   AST 47 (H) 10/22/2015   ALT 31 10/22/2015   PROT 7.3 10/22/2015   ALBUMIN 3.8 10/22/2015   CALCIUM 8.8 (L) 10/22/2015   ANIONGAP 4 (L) 10/22/2015   No results found for: CHOL No results found for: HDL No results found for:  LDLCALC No results found for: TRIG No results found for: CHOLHDL No results found for: HGBA1C    Assessment & Plan:   Problem List Items Addressed This Visit       Cardiovascular and Mediastinum   Essential hypertension    Patient blood pressure is normal patient denies any chest pain or shortness of breath there is no history of palpitation or paroxysmal nocturnal dyspnea   patient was advised to follow low-salt low-cholesterol diet    ideally I want to keep systolic blood pressure below 130 mmHg, patient was asked to check blood pressure one times a week and give me a report on that.  Patient will be follow-up in 3 months  or earlier as needed, patient will call me back for any change in the cardiovascular symptoms Patient was advised to buy a book from local bookstore concerning blood pressure and read several chapters  every day.  This will be supplemented by some of the material we will give him from the office.  Patient should also utilize other resources like YouTube and Internet to learn more about the blood pressure and the diet.         Respiratory   Sleep apnea    Patient is using his CPAP machine         Endocrine   Diabetes mellitus ([place]) -  Primary    - The patient's blood sugar is under control on med. - The patient will continue the current treatment regimen.  - I encouraged the patient to regularly check blood sugar.  - I encouraged the patient to monitor diet. I encouraged the patient to eat low-carb and low-sugar to help prevent blood sugar spikes.  - I encouraged the patient to continue following their prescribed treatment plan for diabetes - I informed the patient to get help if blood sugar drops below 54mg /dL, or if suddenly have trouble thinking clearly or breathing.  Patient was advised to buy a book on diabetes from a local bookstore or from [place]  Patient should read 2 chapters every day to keep the motivation going, this is in addition to some of the  materials we provided them from the office.  There are other resources on the Internet like YouTube and wilkipedia to get an education on the diabetes patient will be referred to the eye doctor.  Also get hemoglobin AIC.  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         Musculoskeletal and Integument   Disorder of rotator cuff, left    This is a chronic problem with stable vital         Genitourinary   CKD (chronic kidney disease) stage 3, GFR 30-59 ml/min ([facility])    Patient was advised to control his diabetes         Other   Adrenal nodule ([place])    We will get a CT scan done next year.        No orders of the defined types were placed in this encounter.   Follow-up: No follow-ups on file.    Cletis Athens, MD

## 2020-07-08 NOTE — Assessment & Plan Note (Signed)
Patient blood pressure is normal patient denies any chest pain or shortness of breath there is no history of palpitation or paroxysmal nocturnal dyspnea   patient was advised to follow low-salt low-cholesterol diet    ideally I want to keep systolic blood pressure below 130 mmHg, patient was asked to check blood pressure one times a week and give me a report on that.  Patient will be follow-up in 3 months  or earlier as needed, patient will call me back for any change in the cardiovascular symptoms Patient was advised to buy a book from local bookstore concerning blood pressure and read several chapters  every day.  This will be supplemented by some of the material we will give him from the office.  Patient should also utilize other resources like YouTube and Internet to learn more about the blood pressure and the diet. 

## 2020-07-14 DIAGNOSIS — U071 COVID-19: Secondary | ICD-10-CM | POA: Diagnosis not present

## 2020-07-23 ENCOUNTER — Other Ambulatory Visit: Payer: Medicare PPO

## 2020-07-24 ENCOUNTER — Ambulatory Visit (INDEPENDENT_AMBULATORY_CARE_PROVIDER_SITE_OTHER): Payer: Medicare PPO

## 2020-07-24 ENCOUNTER — Other Ambulatory Visit: Payer: Self-pay

## 2020-07-24 DIAGNOSIS — N138 Other obstructive and reflux uropathy: Secondary | ICD-10-CM | POA: Diagnosis not present

## 2020-07-24 DIAGNOSIS — N400 Enlarged prostate without lower urinary tract symptoms: Secondary | ICD-10-CM | POA: Diagnosis not present

## 2020-07-24 DIAGNOSIS — N401 Enlarged prostate with lower urinary tract symptoms: Secondary | ICD-10-CM

## 2020-07-24 LAB — POCT URINALYSIS DIPSTICK
Bilirubin, UA: NEGATIVE
Blood, UA: NEGATIVE
Glucose, UA: NEGATIVE
Ketones, UA: NEGATIVE
Leukocytes, UA: NEGATIVE
Nitrite, UA: NEGATIVE
Protein, UA: POSITIVE — AB
Spec Grav, UA: 1.02 (ref 1.010–1.025)
Urobilinogen, UA: 0.2 E.U./dL
pH, UA: 6.5 (ref 5.0–8.0)

## 2020-07-27 DIAGNOSIS — G4733 Obstructive sleep apnea (adult) (pediatric): Secondary | ICD-10-CM | POA: Diagnosis not present

## 2020-07-31 ENCOUNTER — Telehealth: Payer: Self-pay

## 2020-07-31 DIAGNOSIS — N138 Other obstructive and reflux uropathy: Secondary | ICD-10-CM

## 2020-07-31 MED ORDER — TADALAFIL 5 MG PO TABS: 5.0000 mg | ORAL_TABLET | Freq: Every day | ORAL | 3 refills | Status: DC | PRN

## 2020-07-31 NOTE — Telephone Encounter (Signed)
Pt came into office requesting to see provider. Pt was made aware that the provider is in clinic with patients. Offered to assist patient, he states that he never received his RX for Cialis. Upon reviewing chart, it appears rx was printed and not e-scribed to pharmacy as pt requested. RX e-scribed to pt's pharmacy, pt also given coupon for [facility]. Advised pt to call back in regards to medication efficacy.

## 2020-08-01 DIAGNOSIS — M25519 Pain in unspecified shoulder: Secondary | ICD-10-CM | POA: Diagnosis not present

## 2020-08-01 DIAGNOSIS — Z79899 Other long term (current) drug therapy: Secondary | ICD-10-CM | POA: Diagnosis not present

## 2020-08-01 DIAGNOSIS — G894 Chronic pain syndrome: Secondary | ICD-10-CM | POA: Diagnosis not present

## 2020-08-01 DIAGNOSIS — M5416 Radiculopathy, lumbar region: Secondary | ICD-10-CM | POA: Diagnosis not present

## 2020-08-04 DIAGNOSIS — M5416 Radiculopathy, lumbar region: Secondary | ICD-10-CM | POA: Diagnosis not present

## 2020-08-13 DIAGNOSIS — U071 COVID-19: Secondary | ICD-10-CM | POA: Diagnosis not present

## 2020-08-19 ENCOUNTER — Other Ambulatory Visit: Payer: Self-pay | Admitting: *Deleted

## 2020-08-19 DIAGNOSIS — I10 Essential (primary) hypertension: Secondary | ICD-10-CM

## 2020-08-19 MED ORDER — ROSUVASTATIN CALCIUM 10 MG PO TABS: 10.0000 mg | ORAL_TABLET | Freq: Every day | ORAL | 3 refills | Status: DC

## 2020-08-22 NOTE — Progress Notes (Signed)
[facility] [address], [place] [address]  Pulmonary Sleep Medicine   Office Visit Note  Patient Name: Paul Heckel [name] DOB: [birth year] MRN [id number]    Chief Complaint: Obstructive Sleep Apnea visit  Brief History:  [name] is seen today for followup of OSA.   Current therapy APAP @ 5 - 15 cmH2O The patient has a 2 month history of sleep apnea. Patient is using PAP nightly.  The patient feels energetic after sleeping with PAP.  The patient reports benefiting from PAP use. Reported sleepiness is  improved and the Epworth Sleepiness Score is 16 out of 24. The patient does not take naps. The patient complains of the following: not able to rest as much because he works 2 jobs.  The compliance download shows  compliance with an average use time of 5:41 hours @ 77 %. The AHI is 6.9  The patient  of limb movements disrupting sleep.  ROS  General: (-) fever, (-) chills, (-) night sweat Nose and Sinuses: (-) nasal stuffiness or itchiness, (-) postnasal drip, (-) nosebleeds, (-) sinus trouble. Mouth and Throat: (-) sore throat, (-) hoarseness. Neck: (-) swollen glands, (-) enlarged thyroid, (-) neck pain. Respiratory: - cough, - shortness of breath, - wheezing. Neurologic: - numbness, - tingling. Psychiatric: - anxiety, - depression   Current Medication: Outpatient Encounter Medications as of 08/25/2020  Medication Sig   Alcohol Swabs (B-D SINGLE USE SWABS REGULAR) PADS 100 each by Does not apply route daily.   amLODipine (NORVASC) 10 MG tablet Take 1 tablet (10 mg total) by mouth daily.   amLODipine (NORVASC) 10 MG tablet Take 1 tablet by mouth daily.   Blood Glucose Calibration (TRUE METRIX LEVEL 1) Low SOLN 1 Bottle by In Vitro route daily.   Blood Glucose Monitoring Suppl (TRUE METRIX METER) DEVI Check blood sugar daily   doxycycline (VIBRAMYCIN) 100 MG capsule Take 100 mg by mouth daily.   glucose blood (TRUE METRIX PRO BLOOD GLUCOSE) test strip Use as  instructed   Lancets 33G MISC Check blood sugar daily   levothyroxine (SYNTHROID) 175 MCG tablet TAKE 1 TABLET EVERY DAY   levothyroxine (SYNTHROID) 175 MCG tablet Take by mouth.   lisinopril-hydrochlorothiazide (ZESTORETIC) 20-12.5 MG tablet Take 1 tablet by mouth daily.   lisinopril-hydrochlorothiazide (ZESTORETIC) 20-12.5 MG tablet Take 2 tablets by mouth daily.   lisinopril-hydrochlorothiazide (ZESTORETIC) 20-12.5 MG tablet TAKE 1 TABLET EVERY DAY   lisinopril-hydrochlorothiazide (ZESTORETIC) 20-12.5 MG tablet Take 1 tablet by mouth once daily   metFORMIN (GLUCOPHAGE) 1000 MG tablet Take 1 tablet by mouth 2 (two) times daily.   metFORMIN (GLUCOPHAGE) 500 MG tablet Take 1 tablet (500 mg total) by mouth 2 (two) times daily with a meal.   omeprazole (PRILOSEC) 20 MG capsule TAKE 1 CAPSULE EVERY DAY   oxyCODONE-acetaminophen (PERCOCET) 7.5-325 MG tablet    rosuvastatin (CRESTOR) 10 MG tablet Take 1 tablet (10 mg total) by mouth daily.   rosuvastatin (CRESTOR) 10 MG tablet Take 1 tablet (10 mg total) by mouth daily.   tadalafil (CIALIS) 5 MG tablet Take 1 tablet (5 mg total) by mouth daily as needed for erectile dysfunction.   tamsulosin (FLOMAX) 0.4 MG CAPS capsule Take 1 capsule (0.4 mg total) by mouth daily.   No facility-administered encounter medications on file as of 08/25/2020.    Surgical History: Past Surgical History:  Procedure Laterality Date   BACK SURGERY     head surgery     from fall  drilled hole in  brain to relieve pressure    Medical History: Past Medical History:  Diagnosis Date   Acute prostatitis    BPH (benign prostatic hyperplasia)    Chronic kidney disease    Dysplasia of prostate    Erectile dysfunction    HTN (hypertension)    Hypogonadism in male    Orchitis and epididymitis    Over weight    Palindromic rheumatism, hand    Prostatitis    Rectum pain    Testicle swelling    Testicle tenderness    Testicular mass     Family History: Non  contributory to the present illness  Social History: Social History   Socioeconomic History   Marital status: Legally Separated    Spouse name: Not on file   Number of children: Not on file   Years of education: Not on file   Highest education level: Not on file  Occupational History   Not on file  Tobacco Use   Smoking status: Former   Smokeless tobacco: Never   Tobacco comments:    quit 30 years ago  Vaping Use   Vaping Use: Never used  Substance and Sexual Activity   Alcohol use: No    Alcohol/week: 0.0 standard drinks   Drug use: No   Sexual activity: Not on file  Other Topics Concern   Not on file  Social History Narrative   Not on file   Social Determinants of Health   Financial Resource Strain: Not on file  Food Insecurity: Not on file  Transportation Needs: Not on file  Physical Activity: Not on file  Stress: Not on file  Social Connections: Not on file  Intimate Partner Violence: Not on file    Vital Signs: Blood pressure (!) 146/66, pulse 74, resp. rate 16, height '6\' 1"'$  (1.854 m), weight 227 lb 1.6 oz (103 kg), SpO2 98 %.  Examination: General Appearance: The patient is well-developed, well-nourished, and in no distress. Neck Circumference: 42 Skin: Gross inspection of skin unremarkable. Head: normocephalic, no gross deformities. Eyes: no gross deformities noted. ENT: ears appear grossly normal Neurologic: Alert and oriented. No involuntary movements.    EPWORTH SLEEPINESS SCALE:  Scale:  (0)= no chance of dozing; (1)= slight chance of dozing; (2)= moderate chance of dozing; (3)= high chance of dozing  Chance  Situtation    Sitting and reading: 3    Watching TV: 3    Sitting Inactive in public: 2    As a passenger in car: 3      Lying down to rest: 0    Sitting and talking: 1    Sitting quielty after lunch: 3    In a car, stopped in traffic: 1   TOTAL SCORE:   16 out of 24    SLEEP STUDIES:  PSG 04/14/20 - AHI overall 19.4,  REM 44, supine 23.5,  Low SpO2 76%   CPAP COMPLIANCE DATA:  Date Range: 07/21/20-08/19/20  Average Daily Use: 5:41 hours  Median Use: 5:56 hours  Compliance for > 4 Hours: 77%  AHI: 6.9 respiratory events per hour  Days Used: 30/30 days  Mask Leak: 93.6  95th Percentile Pressure: 7.6 cmH2O         LABS: Recent Results (from the past 2160 hour(s))  POCT glucose (manual entry)     Status: Abnormal   Collection Time: 07/08/20  9:48 AM  Result Value Ref Range   POC Glucose 110 (A) 70 - 99 mg/dl  POCT HgB A1C  Status: Abnormal   Collection Time: 07/08/20 11:34 AM  Result Value Ref Range   Hemoglobin A1C 7.1 (A) 4.0 - 5.6 %   HbA1c POC (<> result, manual entry)     HbA1c, POC (prediabetic range)     HbA1c, POC (controlled diabetic range)    POCT urinalysis dipstick     Status: Abnormal   Collection Time: 07/24/20 10:13 AM  Result Value Ref Range   Color, UA Yellow    Clarity, UA Clear    Glucose, UA Negative Negative   Bilirubin, UA Negative    Ketones, UA Negative    Spec Grav, UA 1.020 1.010 - 1.025   Blood, UA Negative    pH, UA 6.5 5.0 - 8.0   Protein, UA Positive (A) Negative   Urobilinogen, UA 0.2 0.2 or 1.0 E.U./dL   Nitrite, UA Negative    Leukocytes, UA Negative Negative   Appearance     Odor      Radiology: MR LUMBAR SPINE WO CONTRAST  Result Date: 07/06/2018 CLINICAL DATA:  Initial evaluation for acute low back pain with left hip and buttock pain and left lower extremity numbness since fall 1 week ago. History of prior surgery. EXAM: MRI LUMBAR SPINE WITHOUT CONTRAST TECHNIQUE: Multiplanar, multisequence MR imaging of the lumbar spine was performed. No intravenous contrast was administered. COMPARISON:  Previous MRI from 03/28/2017. FINDINGS: Segmentation: Transitional lumbosacral anatomy. Lowest well-formed disc will be labeled L5-S1. Same numbering system employed as on previous exam. Alignment: 4 mm anterolisthesis of L4 on L5, stable. Trace  retrolisthesis of L5 on S1. Appearance is stable from previous. Vertebrae: Vertebral body height maintained without evidence for acute or chronic fracture. Bone marrow signal intensity diffusely decreased on T1 weighted imaging, most commonly related to anemia, smoking, or obesity. Superimposed 1 cm benign hemangioma within the L4 vertebral body. No worrisome osseous lesions. Mild reactive endplate changes noted about the right aspect of the L4-5 interspace. Conus medullaris and cauda equina: Conus extends to the L1-2 level. Conus medullaris within normal limits. 6 mm nodular density involving the nerve roots of the cauda equina at the level of L2-3 noted (series 5, image 7), slightly more prominent relative to 2015, but similar as compared to previous study from 2019. 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 Paraspinal and other soft tissues: Paraspinous soft tissues demonstrate no acute finding. Multiple scattered T2 hyperintense simple right renal cyst noted. Visualized visceral structures otherwise unremarkable. Disc levels: L1-2: Negative interspace. Mild bilateral facet hypertrophy. No canal or foraminal stenosis. No impingement. L2-3: Mild disc desiccation with minimal annular disc bulge. Mild-to-moderate facet and ligament flavum hypertrophy. No significant canal or lateral recess stenosis. Foramina remain patent. L3-4: Chronic intervertebral disc space narrowing with diffuse disc bulge and disc desiccation. Disc bulging centric to the left. Moderate facet and ligament flavum hypertrophy with associated small bilateral joint effusions. Associated small synovial cyst noted at the posterior aspect of the L3-4 facets measuring up to 10 mm on the left and 9 mm on the right (series 8, image 20). Resultant severe canal with bilateral subarticular stenosis. Moderate bilateral L3 foraminal narrowing, left slightly worse than right. L4-5: 4 mm anterolisthesis. Associated broad posterior  disc bulge with disc desiccation and intervertebral disc space narrowing. Superimposed right foraminal/far lateral component with slight caudad angulation is relatively similar to previous (series 8, image 21). There is a new/more prominent more focal left subarticular/foraminal component with slight caudad angulation (series 8, image 21). Associated annular fissure. This  disc component contacts the exiting left L4 nerve root in the left neural foramen (series 5, image 13). Severe left with moderate to severe right lateral recess stenosis, slightly worsened from previous. Residual mild narrowing of the central canal relatively unchanged. Moderate bilateral L4 foraminal stenosis, slightly worsened on the left, similar on the right. L5-S1: Diffuse disc bulge with disc desiccation. Superimposed central disc protrusion with annular fissure. Previous left laminectomy. Mild to moderate facet hypertrophy. Residual moderate bilateral subarticular stenosis without narrowing of the central canal. Foramina remain patent. IMPRESSION: 1. Diffuse disc bulge at L4-5 with new/more prominent left subarticular/foraminal component, with worsened moderate left foraminal and severe left lateral recess stenosis, potentially affecting either the left L4 or descending L5 nerve roots. Additional multifactorial degenerative changes at this level with resultant moderate to severe right lateral recess narrowing and moderate right L4 foraminal stenosis relatively stable. 2. Disc bulging with advanced facet degeneration at L3-4 with resultant severe canal with moderate bilateral L3 foraminal stenosis, similar to previous. 3. Disc bulge with small central disc protrusion at L5-S1 with resultant moderate bilateral subarticular stenosis, also similar to previous. 4. 6 mm nodular density involving the nerve roots of the cauda equina at the level of L2-3, similar to previous, but slightly more prominent as compared to 2015. 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 While this is almost certainly benign, further assessment with postcontrast imaging suggested for complete assessment. Electronically Signed   By: Jeannine Boga M.D.   On: 07/06/2018 23:44    No results found.  No results found.    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   COVID 02/19/2020   Headache above the eye region 09/11/2019   Adrenal nodule ([place]) 10/28/2015   Subdural hematoma ([place]) 10/24/2015   Sepsis ([place]) 07/19/2015   Essential hypertension 07/19/2015   Diabetes mellitus ([place]) 07/19/2015   Hypothyroidism 07/19/2015   Peripheral neuropathy 07/19/2015   Chronic back pain 07/19/2015   CKD (chronic kidney disease) stage 3, GFR 30-59 ml/min (HCC) 07/19/2015   Enlarged prostate 03/25/2015   Urine stream spraying 03/25/2015   BPH with obstruction/lower urinary tract symptoms 09/18/2014   Other male erectile dysfunction 09/18/2014   Sleep apnea 04/04/2014   1. OSA on CPAP The patient does tolerate PAP and reports definite benefit from PAP use. The patient was reminded how to clean equipment and advised to replace supplies routinely. The patient was also counselled on getting enough time for sleep. He has mask leak and will be scheduled for a mask fitting. . The compliance is good. The AHI is 6.9.   OSA-- continue with good compliance. The elevated AHI may be from mask leak, will try a different mask and run a download in 2 weeks.   2. CPAP use counseling CPAP Counseling: had a lengthy discussion with the patient regarding the importance of PAP therapy in management of the sleep apnea. Patient appears to understand the risk factor reduction and also understands the risks associated  with untreated sleep apnea. Patient will try to make a good faith effort to remain compliant with therapy. Also instructed the patient on proper cleaning of the device including the water must be changed daily if possible and use of distilled water is preferred. Patient understands  that the machine should be regularly cleaned with appropriate recommended cleaning solutions that do not damage the PAP machine for example given white vinegar and water rinses. Other methods such as ozone treatment may not be as good as these simple methods to achieve cleaning.   3. Essential hypertension Hypertension Counseling:   The following hypertensive lifestyle modification were recommended and discussed:  1. Limiting alcohol intake to less than 1 oz/day of ethanol:(24 oz of beer or 8 oz of wine or 2 oz of 100-proof whiskey). 2. Take baby ASA 81 mg daily. 3. Importance of regular aerobic exercise and losing weight. 4. Reduce dietary saturated fat and cholesterol intake for overall cardiovascular health. 5. Maintaining adequate dietary potassium, calcium, and magnesium intake. 6. Regular monitoring of the blood pressure. 7. Reduce sodium intake to less than 100 mmol/day (less than 2.3 gm of sodium or less than 6 gm of sodium choride)       General Counseling: I have discussed the findings of the evaluation and examination with Arnell Sieving.  I have also discussed any further diagnostic evaluation thatmay be needed or ordered today. Hailey verbalizes understanding of the findings of todays visit. We also reviewed his medications today and discussed drug interactions and side effects including but not limited excessive drowsiness and altered mental states. We also discussed that there is always a risk not just to him but also people around him. he has been encouraged to call the office with any questions or concerns that should arise related to todays visit.  No orders of the defined types were placed in this encounter.        I have personally obtained a history, examined the patient, evaluated laboratory and imaging results, formulated the assessment and plan and placed orders.   This patient was seen today by Tressie Ellis, PA-C in collaboration with Dr. Devona Konig.    Allyne Gee, MD [facility] Diplomate ABMS Pulmonary and Critical Care Medicine Sleep medicine

## 2020-08-25 ENCOUNTER — Ambulatory Visit (INDEPENDENT_AMBULATORY_CARE_PROVIDER_SITE_OTHER): Payer: Medicare PPO | Admitting: Internal Medicine

## 2020-08-25 ENCOUNTER — Other Ambulatory Visit: Payer: Self-pay

## 2020-08-25 VITALS — BP 146/66 | HR 74 | Resp 16 | Ht 73.0 in | Wt 227.1 lb

## 2020-08-25 DIAGNOSIS — Z9989 Dependence on other enabling machines and devices: Secondary | ICD-10-CM | POA: Diagnosis not present

## 2020-08-25 DIAGNOSIS — Z7189 Other specified counseling: Secondary | ICD-10-CM

## 2020-08-25 DIAGNOSIS — G4733 Obstructive sleep apnea (adult) (pediatric): Secondary | ICD-10-CM | POA: Diagnosis not present

## 2020-08-25 DIAGNOSIS — I10 Essential (primary) hypertension: Secondary | ICD-10-CM

## 2020-08-25 NOTE — Patient Instructions (Signed)

## 2020-08-26 ENCOUNTER — Other Ambulatory Visit: Payer: Self-pay

## 2020-08-26 ENCOUNTER — Telehealth: Payer: Self-pay

## 2020-08-26 MED ORDER — LEVOTHYROXINE SODIUM 175 MCG PO TABS: 175.0000 ug | ORAL_TABLET | Freq: Every day | ORAL | 1 refills | Status: DC

## 2020-08-27 DIAGNOSIS — G4733 Obstructive sleep apnea (adult) (pediatric): Secondary | ICD-10-CM | POA: Diagnosis not present

## 2020-09-01 NOTE — Telephone Encounter (Signed)
ERROR

## 2020-09-08 ENCOUNTER — Other Ambulatory Visit: Payer: Self-pay

## 2020-09-08 ENCOUNTER — Encounter: Payer: Self-pay | Admitting: Internal Medicine

## 2020-09-08 ENCOUNTER — Ambulatory Visit (INDEPENDENT_AMBULATORY_CARE_PROVIDER_SITE_OTHER): Payer: Medicare PPO | Admitting: Internal Medicine

## 2020-09-08 VITALS — BP 155/76 | HR 59 | Ht 73.0 in | Wt 226.5 lb

## 2020-09-08 DIAGNOSIS — M5442 Lumbago with sciatica, left side: Secondary | ICD-10-CM

## 2020-09-08 DIAGNOSIS — E039 Hypothyroidism, unspecified: Secondary | ICD-10-CM

## 2020-09-08 DIAGNOSIS — E119 Type 2 diabetes mellitus without complications: Secondary | ICD-10-CM | POA: Diagnosis not present

## 2020-09-08 DIAGNOSIS — G8929 Other chronic pain: Secondary | ICD-10-CM | POA: Diagnosis not present

## 2020-09-08 DIAGNOSIS — G4733 Obstructive sleep apnea (adult) (pediatric): Secondary | ICD-10-CM | POA: Diagnosis not present

## 2020-09-08 DIAGNOSIS — M5441 Lumbago with sciatica, right side: Secondary | ICD-10-CM | POA: Diagnosis not present

## 2020-09-08 DIAGNOSIS — I10 Essential (primary) hypertension: Secondary | ICD-10-CM | POA: Diagnosis not present

## 2020-09-08 MED ORDER — CYCLOBENZAPRINE HCL 5 MG PO TABS
5.0000 mg | ORAL_TABLET | Freq: Three times a day (TID) | ORAL | 1 refills | Status: DC | PRN
Start: 2020-09-08 — End: 2021-10-02

## 2020-09-08 NOTE — Assessment & Plan Note (Signed)
Blood sugar is labile, patient was advised to follow his diet

## 2020-09-08 NOTE — Assessment & Plan Note (Signed)
-   Patient's back pain is under control with medication.  - Encouraged the patient to stretch or do yoga as able to help with back pain, patient was started on Flexeril

## 2020-09-08 NOTE — Assessment & Plan Note (Signed)

## 2020-09-08 NOTE — Progress Notes (Signed)
Established Patient Office Visit  Subjective:  Patient ID: Paul Bovaird [name], male    DOB: January 08, 1946  Age: 75 y.o. MRN: [id number]  CC:  Chief Complaint  Patient presents with   Back Pain    Back Pain This is a chronic problem. The current episode started in the past 7 days. The problem occurs 2 to 4 times per day. The problem has been waxing and waning since onset. Associated symptoms include numbness.   Paul Ramus [name] presents for multiple problems, including back  pain  Past Medical History:  Diagnosis Date   Acute prostatitis    BPH (benign prostatic hyperplasia)    Chronic kidney disease    Dysplasia of prostate    Erectile dysfunction    HTN (hypertension)    Hypogonadism in male    Orchitis and epididymitis    Over weight    Palindromic rheumatism, hand    Prostatitis    Rectum pain    Testicle swelling    Testicle tenderness    Testicular mass     Past Surgical History:  Procedure Laterality Date   BACK SURGERY     head surgery     from fall  drilled hole in brain to relieve pressure    Family History  Problem Relation Age of Onset   Cancer Maternal Aunt    Prostate cancer Neg Hx    Kidney disease Neg Hx    Kidney cancer Neg Hx    Bladder Cancer Neg Hx     Social History   Socioeconomic History   Marital status: Legally Separated    Spouse name: Not on file   Number of children: Not on file   Years of education: Not on file   Highest education level: Not on file  Occupational History   Not on file  Tobacco Use   Smoking status: Former   Smokeless tobacco: Never   Tobacco comments:    quit 30 years ago  Vaping Use   Vaping Use: Never used  Substance and Sexual Activity   Alcohol use: No    Alcohol/week: 0.0 standard drinks   Drug use: No   Sexual activity: Not on file  Other Topics Concern   Not on file  Social History Narrative   Not on file   Social Determinants of Health   Financial Resource Strain: Not on  file  Food Insecurity: Not on file  Transportation Needs: Not on file  Physical Activity: Not on file  Stress: Not on file  Social Connections: Not on file  Intimate Partner Violence: Not on file     Current Outpatient Medications:    cyclobenzaprine (FLEXERIL) 5 MG tablet, Take 1 tablet (5 mg total) by mouth 3 (three) times daily as needed for muscle spasms., Disp: 30 tablet, Rfl: 1   Alcohol Swabs (B-D SINGLE USE SWABS REGULAR) PADS, 100 each by Does not apply route daily., Disp: 100 each, Rfl: 6   amLODipine (NORVASC) 10 MG tablet, Take 1 tablet (10 mg total) by mouth daily., Disp: 90 tablet, Rfl: 0   amLODipine (NORVASC) 10 MG tablet, Take 1 tablet by mouth daily., Disp: , Rfl:    Blood Glucose Calibration (TRUE METRIX LEVEL 1) Low SOLN, 1 Bottle by In Vitro route daily., Disp: 1 each, Rfl: 6   Blood Glucose Monitoring Suppl (TRUE METRIX METER) DEVI, Check blood sugar daily, Disp: 1 each, Rfl: 3   doxycycline (VIBRAMYCIN) 100 MG capsule, Take 100 mg by mouth daily.,  Disp: , Rfl:    glucose blood (TRUE METRIX PRO BLOOD GLUCOSE) test strip, Use as instructed, Disp: 300 each, Rfl: 3   Lancets 33G MISC, Check blood sugar daily, Disp: 300 each, Rfl: 3   levothyroxine (SYNTHROID) 175 MCG tablet, Take 1 tablet (175 mcg total) by mouth daily., Disp: 90 tablet, Rfl: 1   lisinopril-hydrochlorothiazide (ZESTORETIC) 20-12.5 MG tablet, Take 1 tablet by mouth daily., Disp: 90 tablet, Rfl: 0   lisinopril-hydrochlorothiazide (ZESTORETIC) 20-12.5 MG tablet, Take 2 tablets by mouth daily., Disp: , Rfl:    lisinopril-hydrochlorothiazide (ZESTORETIC) 20-12.5 MG tablet, TAKE 1 TABLET EVERY DAY, Disp: 90 tablet, Rfl: 3   lisinopril-hydrochlorothiazide (ZESTORETIC) 20-12.5 MG tablet, Take 1 tablet by mouth once daily, Disp: 90 tablet, Rfl: 0   metFORMIN (GLUCOPHAGE) 1000 MG tablet, Take 1 tablet by mouth 2 (two) times daily., Disp: , Rfl:    metFORMIN (GLUCOPHAGE) 500 MG tablet, Take 1 tablet (500 mg total)  by mouth 2 (two) times daily with a meal., Disp: 180 tablet, Rfl: 3   omeprazole (PRILOSEC) 20 MG capsule, TAKE 1 CAPSULE EVERY DAY, Disp: 90 capsule, Rfl: 3   oxyCODONE-acetaminophen (PERCOCET) 7.5-325 MG tablet, , Disp: , Rfl:    rosuvastatin (CRESTOR) 10 MG tablet, Take 1 tablet (10 mg total) by mouth daily., Disp: 90 tablet, Rfl: 0   rosuvastatin (CRESTOR) 10 MG tablet, Take 1 tablet (10 mg total) by mouth daily., Disp: 90 tablet, Rfl: 3   tadalafil (CIALIS) 5 MG tablet, Take 1 tablet (5 mg total) by mouth daily as needed for erectile dysfunction., Disp: 90 tablet, Rfl: 3   tamsulosin (FLOMAX) 0.4 MG CAPS capsule, Take 1 capsule (0.4 mg total) by mouth daily., Disp: 90 capsule, Rfl: 3   Allergies  Allergen Reactions   Enalapril Maleate Other (See Comments)   Enalapril Maleate Other (See Comments) and Rash    ROS Review of Systems  Constitutional: Negative.   HENT: Negative.    Eyes: Negative.   Respiratory: Negative.    Cardiovascular: Negative.   Gastrointestinal: Negative.   Endocrine: Negative.   Genitourinary: Negative.   Musculoskeletal:  Positive for back pain.  Skin: Negative.   Allergic/Immunologic: Negative.   Neurological:  Positive for numbness.  Hematological: Negative.   Psychiatric/Behavioral: Negative.    All other systems reviewed and are negative.    Objective:    Physical Exam Vitals reviewed.  Constitutional:      Appearance: Normal appearance.  HENT:     Mouth/Throat:     Mouth: Mucous membranes are moist.  Eyes:     Pupils: Pupils are equal, round, and reactive to light.  Neck:     Vascular: No carotid bruit.  Cardiovascular:     Rate and Rhythm: Normal rate and regular rhythm.     Pulses: Normal pulses.     Heart sounds: Normal heart sounds.  Pulmonary:     Effort: Pulmonary effort is normal.     Breath sounds: Normal breath sounds.  Abdominal:     General: Bowel sounds are normal.     Palpations: Abdomen is soft. There is no  hepatomegaly, splenomegaly or mass.     Tenderness: There is no abdominal tenderness.     Hernia: No hernia is present.  Musculoskeletal:     Cervical back: Neck supple.     Right lower leg: No edema.     Left lower leg: No edema.  Skin:    Findings: No rash.  Neurological:     Mental Status: He  is alert and oriented to person, place, and time.     Motor: No weakness.  Psychiatric:        Mood and Affect: Mood normal.        Behavior: Behavior normal.    BP (!) 155/76   Pulse (!) 59   Ht '6\' 1"'$  (1.854 m)   Wt 226 lb 8 oz (102.7 kg)   BMI 29.88 kg/m  Wt Readings from Last 3 Encounters:  09/08/20 226 lb 8 oz (102.7 kg)  08/25/20 227 lb 1.6 oz (103 kg)  07/08/20 220 lb 4.8 oz (99.9 kg)     Health Maintenance Due  Topic Date Due   COVID-19 Vaccine (1) Never done   FOOT EXAM  Never done   OPHTHALMOLOGY EXAM  Never done   Hepatitis C Screening  Never done   Zoster Vaccines- Shingrix (1 of 2) Never done   COLONOSCOPY (Pts 45-4yr Insurance coverage will need to be confirmed)  Never done   TETANUS/TDAP  07/12/2020   INFLUENZA VACCINE  08/25/2020    There are no preventive care reminders to display for this patient.  Lab Results  Component Value Date   TSH 7.19 (H) 09/27/2011   Lab Results  Component Value Date   WBC 12.9 (H) 10/22/2015   HGB 11.6 (L) 10/22/2015   HCT 34.7 (L) 10/22/2015   MCV 84.5 10/22/2015   PLT 155 10/22/2015   Lab Results  Component Value Date   NA 138 10/22/2015   K 3.3 (L) 10/22/2015   CO2 27 10/22/2015   GLUCOSE 125 (H) 10/22/2015   BUN 16 10/22/2015   CREATININE 1.25 (H) 10/22/2015   BILITOT 1.5 (H) 10/22/2015   ALKPHOS 73 10/22/2015   AST 47 (H) 10/22/2015   ALT 31 10/22/2015   PROT 7.3 10/22/2015   ALBUMIN 3.8 10/22/2015   CALCIUM 8.8 (L) 10/22/2015   ANIONGAP 4 (L) 10/22/2015   No results found for: CHOL No results found for: HDL No results found for: LDLCALC No results found for: TRIG No results found for: CHOLHDL Lab  Results  Component Value Date   HGBA1C 7.1 (A) 07/08/2020      Assessment & Plan:   Problem List Items Addressed This Visit       Cardiovascular and Mediastinum   Essential hypertension - Primary     Patient denies any chest pain or shortness of breath there is no history of palpitation or paroxysmal nocturnal dyspnea   patient was advised to follow low-salt low-cholesterol diet    ideally I want to keep systolic blood pressure below 130 mmHg, patient was asked to check blood pressure one times a week and give me a report on that.  Patient will be follow-up in 3 months  or earlier as needed, patient will call me back for any change in the cardiovascular symptoms Patient was advised to buy a book from local bookstore concerning blood pressure and read several chapters  every day.  This will be supplemented by some of the material we will give him from the office.  Patient should also utilize other resources like YouTube and Internet to learn more about the blood pressure and the diet.        Respiratory   OSA (obstructive sleep apnea)    Patient is using CPAP regularly        Endocrine   Diabetes mellitus ([place]    Blood sugar is labile, patient was advised to follow his diet  Hypothyroidism    Patient is compliant with his medicine        Other   Chronic back pain    - Patient's back pain is under control with medication.  - Encouraged the patient to stretch or do yoga as able to help with back pain, patient was started on Flexeril      Relevant Medications   cyclobenzaprine (FLEXERIL) 5 MG tablet    Meds ordered this encounter  Medications   cyclobenzaprine (FLEXERIL) 5 MG tablet    Sig: Take 1 tablet (5 mg total) by mouth 3 (three) times daily as needed for muscle spasms.    Dispense:  30 tablet    Refill:  1    Follow-up: No follow-ups on file.    Cletis Athens, MD

## 2020-09-08 NOTE — Assessment & Plan Note (Signed)
Patient is compliant with his medicine

## 2020-09-08 NOTE — Assessment & Plan Note (Signed)
Patient is using CPAP regularly

## 2020-09-13 DIAGNOSIS — U071 COVID-19: Secondary | ICD-10-CM | POA: Diagnosis not present

## 2020-09-16 ENCOUNTER — Other Ambulatory Visit: Payer: Self-pay

## 2020-09-16 DIAGNOSIS — G8929 Other chronic pain: Secondary | ICD-10-CM

## 2020-09-17 ENCOUNTER — Ambulatory Visit: Payer: Medicare PPO | Admitting: Internal Medicine

## 2020-09-26 ENCOUNTER — Encounter: Payer: Self-pay | Admitting: Internal Medicine

## 2020-09-27 DIAGNOSIS — G4733 Obstructive sleep apnea (adult) (pediatric): Secondary | ICD-10-CM | POA: Diagnosis not present

## 2020-10-01 NOTE — Progress Notes (Signed)
10/02/2020  2:19 PM   Paul Ramus [name] 06-03-1945 [id number]  Referring provider: Cletis Athens, MD [address] [place],  [place] [address]  Urological history: 1. ED -contributing factors of age, BPH, spinal injury, brain injury, DM, HTN, HLD and sleep apnea -SHIM 13 -PDE5i's intolerable   2. BPH with LU TS -PSA pending  -I PSS 7/2 -managed with tamsulosin 0.4 mg  3. Family history of prostate cancer -maternal uncle with fatal prostate cancer  Chief Complaint  Patient presents with   Benign Prostatic Hypertrophy    HPI: Paul Shia [name] is a 75 y.o.  male who presents today for PSA, I PSS, SHIM and exam.    He has indigestion with PDE5i's.  Patient is not having spontaneous erections.  He denies any pain or curvature with erections.      SHIM     Row Name 10/02/20 1357         SHIM: Over the last 6 months:   How do you rate your confidence that you could get and keep an erection? Low     When you had erections with sexual stimulation, how often were your erections hard enough for penetration (entering your partner)? A Few Times (much less than half the time)     During sexual intercourse, how often were you able to maintain your erection after you had penetrated (entered) your partner? Sometimes (about half the time)     During sexual intercourse, how difficult was it to maintain your erection to completion of intercourse? Difficult     When you attempted sexual intercourse, how often was it satisfactory for you? Sometimes (about half the time)           SHIM Total Score   SHIM 13               Score: 1-7 Severe ED 8-11 Moderate ED 12-16 Mild-Moderate ED 17-21 Mild ED 22-25 No ED  He states he is having a split stream, but he does not have any difficulty urinating.  Patient denies any modifying or aggravating factors.  Patient denies any gross hematuria, dysuria or suprapubic/flank pain.  Patient denies any fevers, chills, nausea or  vomiting.    He states he is not taking the tamsulosin 0.4 mg daily.      IPSS     Row Name 10/02/20 1300         International Prostate Symptom Score   How often have you had the sensation of not emptying your bladder? Less than half the time     How often have you had to urinate less than every two hours? Not at All     How often have you found you stopped and started again several times when you urinated? Less than half the time     How often have you found it difficult to postpone urination? Not at All     How often have you had a weak urinary stream? Less than half the time     How often have you had to strain to start urination? Not at All     How many times did you typically get up at night to urinate? 1 Time     Total IPSS Score 7           Quality of Life due to urinary symptoms   If you were to spend the rest of your life with your urinary condition just the way it is now how would you  feel about that? Mostly Satisfied               Score:  1-7 Mild 8-19 Moderate 20-35 Severe   PMH: Past Medical History:  Diagnosis Date   Acute prostatitis    BPH (benign prostatic hyperplasia)    Chronic kidney disease    Dysplasia of prostate    Erectile dysfunction    HTN (hypertension)    Hypogonadism in male    Orchitis and epididymitis    Over weight    Palindromic rheumatism, hand    Prostatitis    Rectum pain    Testicle swelling    Testicle tenderness    Testicular mass    Surgical History: Past Surgical History:  Procedure Laterality Date   BACK SURGERY     head surgery     from fall  drilled hole in brain to relieve pressure   Home Medications:  Allergies as of 10/02/2020       Reactions   Enalapril Maleate Other (See Comments)   Enalapril Maleate Other (See Comments), Rash        Medication List        Accurate as of October 02, 2020 11:59 PM. If you have any questions, ask your nurse or doctor.          amLODipine 10 MG  tablet Commonly known as: NORVASC Take 1 tablet (10 mg total) by mouth daily. What changed: Another medication with the same name was removed. Continue taking this medication, and follow the directions you see here. Changed by: Nelia Rogoff, PA-C   B-D SINGLE USE SWABS REGULAR Pads 100 each by Does not apply route daily.   cyclobenzaprine 5 MG tablet Commonly known as: FLEXERIL Take 1 tablet (5 mg total) by mouth 3 (three) times daily as needed for muscle spasms.   doxycycline 100 MG capsule Commonly known as: VIBRAMYCIN Take 100 mg by mouth daily.   Lancets 33G Misc Check blood sugar daily   levothyroxine 175 MCG tablet Commonly known as: SYNTHROID Take 1 tablet (175 mcg total) by mouth daily.   lisinopril-hydrochlorothiazide 20-12.5 MG tablet Commonly known as: ZESTORETIC TAKE 1 TABLET EVERY DAY What changed: Another medication with the same name was removed. Continue taking this medication, and follow the directions you see here. Changed by: Zara Council, PA-C   metFORMIN 500 MG tablet Commonly known as: GLUCOPHAGE Take 1 tablet (500 mg total) by mouth 2 (two) times daily with a meal. What changed: Another medication with the same name was removed. Continue taking this medication, and follow the directions you see here. Changed by: Zara Council, PA-C   omeprazole 20 MG capsule Commonly known as: PRILOSEC TAKE 1 CAPSULE EVERY DAY   oxyCODONE-acetaminophen 7.5-325 MG tablet Commonly known as: PERCOCET   pregabalin 100 MG capsule Commonly known as: Lyrica Take 1 capsule (100 mg total) by mouth 2 (two) times daily. Started by: Renato Gails, CMA   rosuvastatin 10 MG tablet Commonly known as: CRESTOR Take 1 tablet (10 mg total) by mouth daily.   tadalafil 5 MG tablet Commonly known as: CIALIS Take 1 tablet (5 mg total) by mouth daily as needed for erectile dysfunction.   tamsulosin 0.4 MG Caps capsule Commonly known as: FLOMAX Take 1 capsule (0.4  mg total) by mouth daily.   True Metrix Level 1 Low Soln 1 Bottle by In Vitro route daily.   True Metrix Meter Devi Check blood sugar daily   True Metrix Pro Blood Glucose test strip  Generic drug: glucose blood Use as instructed       Allergies:  Allergies  Allergen Reactions   Enalapril Maleate Other (See Comments)   Enalapril Maleate Other (See Comments) and Rash   Family History: Family History  Problem Relation Age of Onset   Cancer Maternal Aunt    Prostate cancer Neg Hx    Kidney disease Neg Hx    Kidney cancer Neg Hx    Bladder Cancer Neg Hx     Social History:  reports that he has quit smoking. He has never used smokeless tobacco. He reports that he does not drink alcohol and does not use drugs.  ROS: For pertinent review of systems please refer to history of present illness  Physical Exam: BP (!) 152/72   Pulse 60   Ht '6\' 1"'$  (1.854 m)   Wt 226 lb (102.5 kg)   BMI 29.82 kg/m   Constitutional:  Well nourished. Alert and oriented, No acute distress. HEENT: [place] AT, mask in place  Trachea midline Cardiovascular: No clubbing, cyanosis, or edema. Respiratory: Normal respiratory effort, no increased work of breathing. Neurologic: Grossly intact, no focal deficits, moving all 4 extremities. Psychiatric: Normal mood and affect.   Deferred genital/rectal exam today - states PCP performed   Laboratory Data Component     Latest Ref Rng & Units 07/24/2020  Color, UA      Yellow  Clarity, UA      Clear  Glucose     Negative Negative  Bilirubin, UA      Negative  Ketones, UA      Negative  Specific Gravity, UA     1.010 - 1.025 1.020  RBC, UA      Negative  pH, UA     5.0 - 8.0 6.5  Protein,UA     Negative Positive (A)  Urobilinogen, UA     0.2 or 1.0 E.U./dL 0.2  Nitrite, UA      Negative  Leukocytes,UA     Negative Negative   Hemoglobin A1C 4.0 - 5.6 % 7.1 Abnormal    HbA1c POC (<> result, manual entry)    HbA1c, POC (prediabetic range)     HbA1c, POC (controlled diabetic range)           Specimen Collected: 07/08/20 11:34 Last Resulted: 07/08/20 11:34        I have reviewed the labs.  Assessment & Plan:    1. BPH with LUTS -PSA pending -DRE benign -symptoms - split stream  -restart tamsulosin 0.4 mg daily- refills given   2. Erectile dysfunction -mild to moderate symptoms -intolerable to PDE5i's -not interested in [place] -dicussed IPP - patient will talk in over with his wife prior to referral   Return in about 1 year (around 10/02/2021) for IPSS, SHIM, PSA and exam.  Zara Council, [facility] [place] [address] [place], [place] [address] [phone]

## 2020-10-02 ENCOUNTER — Other Ambulatory Visit: Payer: Self-pay

## 2020-10-02 ENCOUNTER — Ambulatory Visit: Payer: Medicare PPO | Admitting: Urology

## 2020-10-02 ENCOUNTER — Ambulatory Visit: Payer: Self-pay | Admitting: Urology

## 2020-10-02 ENCOUNTER — Encounter: Payer: Self-pay | Admitting: Urology

## 2020-10-02 VITALS — BP 152/72 | HR 60 | Ht 73.0 in | Wt 226.0 lb

## 2020-10-02 DIAGNOSIS — N401 Enlarged prostate with lower urinary tract symptoms: Secondary | ICD-10-CM | POA: Diagnosis not present

## 2020-10-02 DIAGNOSIS — N400 Enlarged prostate without lower urinary tract symptoms: Secondary | ICD-10-CM | POA: Diagnosis not present

## 2020-10-02 DIAGNOSIS — N138 Other obstructive and reflux uropathy: Secondary | ICD-10-CM | POA: Diagnosis not present

## 2020-10-02 DIAGNOSIS — N529 Male erectile dysfunction, unspecified: Secondary | ICD-10-CM

## 2020-10-02 MED ORDER — TAMSULOSIN HCL 0.4 MG PO CAPS: 0.4000 mg | ORAL_CAPSULE | Freq: Every day | ORAL | 3 refills | Status: DC

## 2020-10-02 MED ORDER — PREGABALIN 100 MG PO CAPS: 100.0000 mg | ORAL_CAPSULE | Freq: Two times a day (BID) | ORAL | 1 refills | Status: DC

## 2020-10-03 LAB — PSA: Prostate Specific Ag, Serum: 0.5 ng/mL (ref 0.0–4.0)

## 2020-10-08 ENCOUNTER — Other Ambulatory Visit: Payer: Self-pay

## 2020-10-08 MED ORDER — TRAMADOL HCL 50 MG PO TABS: 50.0000 mg | ORAL_TABLET | Freq: Two times a day (BID) | ORAL | 0 refills | Status: DC

## 2020-10-14 DIAGNOSIS — U071 COVID-19: Secondary | ICD-10-CM | POA: Diagnosis not present

## 2020-10-27 DIAGNOSIS — G4733 Obstructive sleep apnea (adult) (pediatric): Secondary | ICD-10-CM | POA: Diagnosis not present

## 2020-10-28 ENCOUNTER — Ambulatory Visit
Payer: Medicare PPO | Attending: Student in an Organized Health Care Education/Training Program | Admitting: Student in an Organized Health Care Education/Training Program

## 2020-10-28 ENCOUNTER — Other Ambulatory Visit: Payer: Self-pay

## 2020-10-28 ENCOUNTER — Encounter: Payer: Self-pay | Admitting: Student in an Organized Health Care Education/Training Program

## 2020-10-28 VITALS — BP 143/75 | HR 74 | Temp 96.8°F | Resp 16 | Ht 72.0 in | Wt 225.7 lb

## 2020-10-28 DIAGNOSIS — M48062 Spinal stenosis, lumbar region with neurogenic claudication: Secondary | ICD-10-CM | POA: Diagnosis not present

## 2020-10-28 DIAGNOSIS — Z79891 Long term (current) use of opiate analgesic: Secondary | ICD-10-CM | POA: Diagnosis not present

## 2020-10-28 DIAGNOSIS — M5416 Radiculopathy, lumbar region: Secondary | ICD-10-CM | POA: Diagnosis not present

## 2020-10-28 DIAGNOSIS — G894 Chronic pain syndrome: Secondary | ICD-10-CM | POA: Diagnosis not present

## 2020-10-28 DIAGNOSIS — M5136 Other intervertebral disc degeneration, lumbar region: Secondary | ICD-10-CM | POA: Insufficient documentation

## 2020-10-28 DIAGNOSIS — M47816 Spondylosis without myelopathy or radiculopathy, lumbar region: Secondary | ICD-10-CM | POA: Insufficient documentation

## 2020-10-28 DIAGNOSIS — G8929 Other chronic pain: Secondary | ICD-10-CM | POA: Insufficient documentation

## 2020-10-28 NOTE — Progress Notes (Signed)
Patient: Paul Ramus [name]  Service Category: E/M  Provider: Gillis Santa, MD  DOB: [birth year]  DOS: 10/28/2020  Referring Provider: Carmelia Roller, MD  MRN: [id number]  Setting: Ambulatory outpatient  PCP: Cletis Athens, MD  Type: New Patient  Specialty: Interventional Pain Management    Location: Office  Delivery: Face-to-face     Primary Reason(s) for Visit: Encounter for initial evaluation of one or more chronic problems (new to examiner) potentially causing chronic pain, and posing a threat to normal musculoskeletal function. (Level of risk: High) CC: Shoulder Pain (left) and Back Pain (lower)  HPI  Paul Briggs is a 75 y.o. year old, male patient, who comes for the first time to our practice referred by Carmelia Roller, MD for our initial evaluation of his chronic pain. He has BPH with obstruction/lower urinary tract symptoms; Other male erectile dysfunction; Enlarged prostate; Urine stream spraying; Sepsis ([place]); Essential hypertension; Diabetes mellitus ([place]); Hypothyroidism; Peripheral neuropathy; Chronic back pain; CKD (chronic kidney disease) stage 3, GFR 30-59 ml/min ([place]); Adrenal nodule ([place]); Sleep apnea; Subdural hematoma; Headache above the eye region; COVID; Idiopathic sleep related nonobstructive alveolar hypoventilation; OSA (obstructive sleep apnea); Disorder of rotator cuff, left; OSA on CPAP; and CPAP use counseling on their problem list. Today he comes in for evaluation of his Shoulder Pain (left) and Back Pain (lower)  Pain Assessment: Location: Left Shoulder Radiating: denies Onset: More than a month ago Duration: Chronic pain Quality:  (jabbing) Severity: 10-Worst pain ever/10 (subjective, self-reported pain score)  Effect on ADL: cannot lift left arm above head; diffuicult to sleep Timing: Constant Modifying factors: oxycodone helped in the past BP: (!) 143/75  HR: 74  Onset and Duration: Present longer than 3 months Cause of pain: Unknown Severity:  Getting better, NAS-11 at its worse: 9/10, and NAS-11 on the average: 6/10 Timing: Night Aggravating Factors: Kneeling, Lifiting, and Working Alleviating Factors:  no alleviating factors Associated Problems: Night-time cramps, Tingling, Pain that wakes patient up, and Pain that does not allow patient to sleep Quality of Pain: Aching and Sharp Previous Examinations or Tests: The patient denies any previous exams or tests Previous Treatments: The patient denies any previous treatments  [name] is a pleasant 75 year old male who presents with a chief complaint of left shoulder pain, low back pain that radiates down posterior lateral leg extending dermatomal fashion.  He has a history of lumbar neuroforaminal stenosis, lumbar spinal stenosis with neurogenic claudication, lumbar degenerative disc disease, lumbar spondylosis.  Of note, he was previously being seen at [facility].  He was being seen by Dr. Cristy Folks.  He was on chronic opioid therapy with Percocet 10 mg every 4-6 hours, 50 mg a day.  Dr. Cristy Folks left the practice as he moved to [place] and patient states that his interaction with the nurse practitioner has not been pleasant.  She has accused him of diversion and patient felt disrespected by this.  He works as a Education officer, museum and states that he does not take his medications when he is working and this is the reason that he did not have any oxycodone in his urine specimen will be tested.  He has tried lumbar epidural steroid injections which used to be effective but states that his most recent ones provided less duration of pain relief and were expensive.  He has done physical therapy for his low back and shoulder.  He is on Lyrica 100 mg twice a day.  He also takes [facility] Goody's powder.  His previous dose  of oxycodone was 10 mg every 8 hours as needed that was prescribed by Dr. Chesley Noon who took over after Dr. Cristy Folks.  Historic Controlled Substance Pharmacotherapy Review  PMP and historical list  of controlled substances: Oxycodone 10 mg every 8 hours as needed; MME: 45 Historical Monitoring: The patient  reports no history of drug use. List of all UDS Test(s): No results found for: MDMA, COCAINSCRNUR, [place], [place], CANNABQUANT, THCU, [place] List of other Serum/Urine Drug Screening Test(s):  No results found for: AMPHSCRSER, BARBSCRSER, BENZOSCRSER, COCAINSCRSER, COCAINSCRNUR, PCPSCRSER, PCPQUANT, THCSCRSER, THCU, CANNABQUANT, OPIATESCRSER, OXYSCRSER, PROPOXSCRSER, ETH Historical Background Evaluation: [place] PMP: PDMP reviewed during this encounter. Online review of the past 59-monthperiod conducted.               Department of public safety, offender search: (Editor, commissioningInformation) Non-contributory Risk Assessment Profile: Aberrant behavior: None observed or detected today Risk factors for fatal opioid overdose: None identified today Fatal overdose hazard ratio (HR): Calculation deferred Non-fatal overdose hazard ratio (HR): Calculation deferred Risk of opioid abuse or dependence: 0.7-3.0% with doses ? 36 MME/day and 6.1-26% with doses ? 120 MME/day. Substance use disorder (SUD) risk level: See below Personal History of Substance Abuse (SUD-Substance use disorder):  Alcohol: Negative  Illegal Drugs: Negative  Rx Drugs: Negative  ORT Risk Level calculation: Low Risk  Opioid Risk Tool - 10/28/20 1004       Family History of Substance Abuse   Alcohol Positive Male    Illegal Drugs Negative    Rx Drugs Negative      Personal History of Substance Abuse   Alcohol Negative    Illegal Drugs Negative    Rx Drugs Negative      Age   Age between 168-45years  No      History of Preadolescent Sexual Abuse   History of Preadolescent Sexual Abuse Negative or Male      Psychological Disease   Psychological Disease Negative    Depression Negative      Total Score   Opioid Risk Tool Scoring 3    Opioid Risk Interpretation Low Risk            ORT Scoring interpretation  table:  Score <3 = Low Risk for SUD  Score between 4-7 = Moderate Risk for SUD  Score >8 = High Risk for Opioid Abuse   PHQ-2 Depression Scale:  Total score:    PHQ-2 Scoring interpretation table: (Score and probability of major depressive disorder)  Score 0 = No depression  Score 1 = 15.4% Probability  Score 2 = 21.1% Probability  Score 3 = 38.4% Probability  Score 4 = 45.5% Probability  Score 5 = 56.4% Probability  Score 6 = 78.6% Probability   PHQ-9 Depression Scale:  Total score:    PHQ-9 Scoring interpretation table:  Score 0-4 = No depression  Score 5-9 = Mild depression  Score 10-14 = Moderate depression  Score 15-19 = Moderately severe depression  Score 20-27 = Severe depression (2.4 times higher risk of SUD and 2.89 times higher risk of overuse)   Pharmacologic Plan: As per protocol, I have not taken over any controlled substance management, pending the results of ordered tests and/or consults.            Initial impression: Pending review of available data and ordered tests.  Meds   Current Outpatient Medications:    Alcohol Swabs (B-D SINGLE USE SWABS REGULAR) PADS, 100 each by Does not apply route daily., Disp: 100 each,  Rfl: 6   amLODipine (NORVASC) 10 MG tablet, Take 1 tablet (10 mg total) by mouth daily., Disp: 90 tablet, Rfl: 0   Aspirin-Salicylamide-Caffeine (ARTHRITIS STRENGTH BC POWDER PO), Take by mouth., Disp: , Rfl:    Blood Glucose Calibration (TRUE METRIX LEVEL 1) Low SOLN, 1 Bottle by In Vitro route daily., Disp: 1 each, Rfl: 6   Blood Glucose Monitoring Suppl (TRUE METRIX METER) DEVI, Check blood sugar daily, Disp: 1 each, Rfl: 3   cyclobenzaprine (FLEXERIL) 5 MG tablet, Take 1 tablet (5 mg total) by mouth 3 (three) times daily as needed for muscle spasms., Disp: 30 tablet, Rfl: 1   doxycycline (VIBRAMYCIN) 100 MG capsule, Take 100 mg by mouth daily., Disp: , Rfl:    glucose blood (TRUE METRIX PRO BLOOD GLUCOSE) test strip, Use as instructed, Disp: 300  each, Rfl: 3   Lancets 33G MISC, Check blood sugar daily, Disp: 300 each, Rfl: 3   levothyroxine (SYNTHROID) 175 MCG tablet, Take 1 tablet (175 mcg total) by mouth daily., Disp: 90 tablet, Rfl: 1   lisinopril-hydrochlorothiazide (ZESTORETIC) 20-12.5 MG tablet, TAKE 1 TABLET EVERY DAY, Disp: 90 tablet, Rfl: 3   metFORMIN (GLUCOPHAGE) 500 MG tablet, Take 1 tablet (500 mg total) by mouth 2 (two) times daily with a meal., Disp: 180 tablet, Rfl: 3   omeprazole (PRILOSEC) 20 MG capsule, TAKE 1 CAPSULE EVERY DAY, Disp: 90 capsule, Rfl: 3   pregabalin (LYRICA) 100 MG capsule, Take 1 capsule (100 mg total) by mouth 2 (two) times daily., Disp: 60 capsule, Rfl: 1   rosuvastatin (CRESTOR) 10 MG tablet, Take 1 tablet (10 mg total) by mouth daily., Disp: 90 tablet, Rfl: 3   tamsulosin (FLOMAX) 0.4 MG CAPS capsule, Take 1 capsule (0.4 mg total) by mouth daily., Disp: 90 capsule, Rfl: 3   traMADol (ULTRAM) 50 MG tablet, Take 1 tablet (50 mg total) by mouth 2 (two) times daily., Disp: 60 tablet, Rfl: 0   tadalafil (CIALIS) 5 MG tablet, Take 1 tablet (5 mg total) by mouth daily as needed for erectile dysfunction. (Patient not taking: Reported on 10/28/2020), Disp: 90 tablet, Rfl: 3  Imaging Review    CT CERVICAL SPINE WO CONTRAST  Narrative CLINICAL DATA:  Neck pain secondary to a fall.  EXAM: CT CERVICAL SPINE WITHOUT CONTRAST  TECHNIQUE: Multidetector CT imaging of the cervical spine was performed without intravenous contrast. Multiplanar CT image reconstructions were also generated.  COMPARISON:  Radiographs dated 07/2013 and CT scan dated 12/23/2008  FINDINGS: No fracture or abnormal prevertebral soft tissue swelling. Alignment: No subluxation.  Skull base and vertebrae: Diffuse degenerative disc disease with osteophyte formation in the vertebral endplates throughout the cervical spine. Slight osteophyte formation in the anterior arch of C1 and the odontoid with calcification of the transverse  ligament.  Soft tissues and spinal canal: Calcification in the carotid arteries. Thyroid gland is atrophic with multiple tiny calcifications in the small right lobe. Diffuse narrowing of the AP dimension the spinal canal throughout the cervical spine.  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 Small broad-based disc bulges at each level, minimally progressed since 2010. No severe foraminal stenosis.  Upper chest: Normal.  IMPRESSION: 1. No acute abnormalities of the cervical spine. 2. Diffuse degenerative disc disease, minimally progressed since 2010.   Electronically Signed By: Lorriane Shire M.D. DG Shoulder Right  Narrative CLINICAL  DATA:  Pain  EXAM: RIGHT SHOULDER - 2+ VIEW  COMPARISON:  None.  FINDINGS: Oblique, Y scapular, and axillary images were obtained. No evident fracture or dislocation. 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 There is mild narrowing of the glenohumeral joint. No erosive change or intra-articular calcification. Visualized right lung clear.  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 No fracture or dislocation.   Electronically Signed By: Lowella Grip III M.D. On: 04/04/2017 07:27 MR LUMBAR SPINE WO CONTRAST  Narrative CLINICAL DATA:  Initial evaluation for acute low back pain with left hip and buttock pain and left lower extremity numbness since fall 1 week ago. History of prior surgery.  EXAM: MRI LUMBAR SPINE WITHOUT CONTRAST  TECHNIQUE: Multiplanar, multisequence MR imaging of the lumbar spine was performed. No intravenous contrast was administered.  COMPARISON:  Previous MRI from 03/28/2017.  FINDINGS: Segmentation: Transitional lumbosacral anatomy. Lowest well-formed disc will be labeled  L5-S1. Same numbering system employed as on previous exam.  Alignment: 4 mm anterolisthesis of L4 on L5, stable. Trace retrolisthesis of L5 on S1. Appearance is stable from previous.  Vertebrae: Vertebral body height maintained without evidence for acute or chronic fracture. Bone marrow signal intensity diffusely decreased on T1 weighted imaging, most commonly related to anemia, smoking, or obesity. Superimposed 1 cm benign hemangioma within the L4 vertebral body. No worrisome osseous lesions. Mild reactive endplate changes noted about the right aspect of the L4-5 interspace.  Conus medullaris and cauda equina: Conus extends to the L1-2 level. Conus medullaris within normal limits. 6 mm nodular density involving the nerve roots of the cauda equina at the level of L2-3 noted (series 5, image 7), slightly more prominent relative to 2015, but similar as compared to previous study from 2019. 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  Paraspinal and other soft tissues: Paraspinous soft tissues demonstrate no acute finding. Multiple scattered T2 hyperintense simple right renal cyst noted. Visualized visceral structures otherwise unremarkable.  Disc levels:  L1-2: Negative interspace. Mild bilateral facet hypertrophy. No canal or foraminal stenosis. No impingement.  L2-3: Mild disc desiccation with minimal annular disc bulge. Mild-to-moderate facet and ligament flavum hypertrophy. No significant canal or lateral recess stenosis. Foramina remain patent.  L3-4: Chronic intervertebral disc space narrowing with diffuse disc bulge and disc desiccation. Disc bulging centric to the left. Moderate facet and ligament flavum hypertrophy with associated small bilateral joint effusions. Associated small synovial cyst noted at the posterior aspect of the L3-4 facets measuring up to 10 mm on the left and 9 mm on the right (series 8, image 20). Resultant severe canal  with bilateral subarticular stenosis. Moderate bilateral L3 foraminal narrowing, left slightly worse than right.  L4-5: 4 mm anterolisthesis. Associated broad posterior disc bulge with disc desiccation and intervertebral disc space narrowing. Superimposed right foraminal/far lateral component with slight caudad angulation is relatively similar to previous (series 8, image 21). There is a new/more prominent more focal left subarticular/foraminal component with slight caudad angulation (series 8, image 21). Associated annular fissure. This disc component contacts the exiting left L4 nerve root in the left neural foramen (series 5, image 13). Severe left with moderate to severe right lateral recess stenosis, slightly worsened from previous. Residual mild narrowing of the central canal relatively unchanged. Moderate bilateral L4 foraminal stenosis, slightly worsened on the left, similar on the right.  L5-S1: Diffuse disc bulge with disc desiccation. Superimposed central disc protrusion with annular fissure. Previous left laminectomy. Mild  to moderate facet hypertrophy. Residual moderate bilateral subarticular stenosis without narrowing of the central canal. Foramina remain patent.  IMPRESSION: 1. Diffuse disc bulge at L4-5 with new/more prominent left subarticular/foraminal component, with worsened moderate left foraminal and severe left lateral recess stenosis, potentially affecting either the left L4 or descending L5 nerve roots. Additional multifactorial degenerative changes at this level with resultant moderate to severe right lateral recess narrowing and moderate right L4 foraminal stenosis relatively stable. 2. Disc bulging with advanced facet degeneration at L3-4 with resultant severe canal with moderate bilateral L3 foraminal stenosis, similar to previous. 3. Disc bulge with small central disc protrusion at L5-S1 with resultant moderate bilateral subarticular stenosis, also  similar to previous. 4. 6 mm nodular density involving the nerve roots of the cauda equina at the level of L2-3, similar to previous, but slightly more prominent as compared to 2015. 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 While this is almost certainly benign, further assessment with postcontrast imaging suggested for complete assessment.   Electronically Signed By: Jeannine Boga M.D. On: 07/06/2018 23:44  Narrative CLINICAL DATA:  Right hip pain, numbness  EXAM: DG HIP (WITH OR WITHOUT PELVIS) 2-3V RIGHT  COMPARISON:  04/27/2015  FINDINGS: Mild degenerative changes in the right hip with joint space narrowing and spurring. Left hip joint space is maintained. SI joints are symmetric and unremarkable. No acute bony abnormality. Specifically, no fracture, subluxation, or dislocation.  IMPRESSION: Mild degenerative changes in the right hip. No acute bony abnormality.   Electronically Signed By: Rolm Baptise M.D. On: 02/23/2017 18:30 Complexity Note: Imaging results reviewed. Results shared with Mr. Macon, using Layman's terms.                         ROS  Cardiovascular: High blood pressure Pulmonary or Respiratory: No reported pulmonary signs or symptoms such as wheezing and difficulty taking a deep full breath (Asthma), difficulty blowing air out (Emphysema), coughing up mucus (Bronchitis), persistent dry cough, or temporary stoppage of breathing during sleep Neurological: No reported neurological signs or symptoms such as seizures, abnormal skin sensations, urinary and/or fecal incontinence, being born with an abnormal open spine and/or a tethered spinal cord Psychological-Psychiatric: No reported psychological or psychiatric signs or symptoms such as difficulty sleeping, anxiety, depression, delusions or hallucinations (schizophrenial), mood swings (bipolar disorders) or suicidal ideations or attempts Gastrointestinal: No reported  gastrointestinal signs or symptoms such as vomiting or evacuating blood, reflux, heartburn, alternating episodes of diarrhea and constipation, inflamed or scarred liver, or pancreas or irrregular and/or infrequent bowel movements Genitourinary: No reported renal or genitourinary signs or symptoms such as difficulty voiding or producing urine, peeing blood, non-functioning kidney, kidney stones, difficulty emptying the bladder, difficulty controlling the flow of urine, or chronic kidney disease Hematological: No reported hematological signs or symptoms such as prolonged bleeding, low or poor functioning platelets, bruising or bleeding easily, hereditary bleeding problems, low energy levels due to low hemoglobin or being anemic Endocrine: No reported endocrine signs or symptoms such as high or low blood sugar, rapid heart rate due to high thyroid levels, obesity or weight gain due to slow thyroid or thyroid disease Rheumatologic: No reported rheumatological signs and symptoms such as fatigue, joint pain, tenderness, swelling, redness, heat, stiffness, decreased range of motion, with or without associated rash Musculoskeletal: Negative for myasthenia gravis, muscular dystrophy, multiple sclerosis or malignant hyperthermia  Allergies  Mr. Billig is allergic to enalapril maleate and enalapril maleate.  Laboratory Chemistry  Profile   Renal Lab Results  Component Value Date   BUN 16 10/22/2015   CREATININE 1.25 (H) 10/22/2015   GFRAA >60 10/22/2015   GFRNONAA 57 (L) 10/22/2015   SPECGRAV 1.020 07/24/2020   PHUR 6.5 07/24/2020   PROTEINUR Positive (A) 07/24/2020     Electrolytes Lab Results  Component Value Date   NA 138 10/22/2015   K 3.3 (L) 10/22/2015   CL 107 10/22/2015   CALCIUM 8.8 (L) 10/22/2015     Hepatic Lab Results  Component Value Date   AST 47 (H) 10/22/2015   ALT 31 10/22/2015   ALBUMIN 3.8 10/22/2015   ALKPHOS 73 10/22/2015   LIPASE 357 02/25/2012     ID No results  found for: LYMEIGGIGMAB, HIV, SARSCOV2NAA, STAPHAUREUS, MRSAPCR, HCVAB, PREGTESTUR, RMSFIGG, QFVRPH1IGG, QFVRPH2IGG, LYMEIGGIGMAB   Bone Lab Results  Component Value Date   TESTOSTERONE 429 04/19/2017     Endocrine Lab Results  Component Value Date   GLUCOSE 125 (H) 10/22/2015   GLUCOSEU 2+ (A) 10/04/2019   HGBA1C 7.1 (A) 07/08/2020   TSH 7.19 (H) 09/27/2011   TESTOSTERONE 429 04/19/2017     Neuropathy Lab Results  Component Value Date   HGBA1C 7.1 (A) 07/08/2020     CNS No results found for: COLORCSF, APPEARCSF, RBCCOUNTCSF, WBCCSF, POLYSCSF, LYMPHSCSF, EOSCSF, PROTEINCSF, GLUCCSF, JCVIRUS, CSFOLI, IGGCSF, LABACHR, ACETBL, LABACHR, ACETBL   Inflammation (CRP: Acute  ESR: Chronic) Lab Results  Component Value Date   ESRSEDRATE 36 (H) 07/20/2015   LATICACIDVEN 1.6 07/20/2015     Rheumatology No results found for: RF, ANA, LABURIC, URICUR, LYMEIGGIGMAB, LYMEABIGMQN, HLAB27   Coagulation Lab Results  Component Value Date   INR 1.04 10/22/2015   LABPROT 13.6 10/22/2015   APTT 33 10/22/2015   PLT 155 10/22/2015     Cardiovascular Lab Results  Component Value Date   CKTOTAL 599 (H) 06/16/2013   TROPONINI 0.03 (HH) 10/22/2015   HGB 11.6 (L) 10/22/2015   HCT 34.7 (L) 10/22/2015     Screening No results found for: SARSCOV2NAA, COVIDSOURCE, STAPHAUREUS, MRSAPCR, HCVAB, HIV, PREGTESTUR   Cancer No results found for: CEA, CA125, LABCA2   Allergens No results found for: ALMOND, APPLE, ASPARAGUS, AVOCADO, BANANA, BARLEY, BASIL, BAYLEAF, GREENBEAN, LIMABEAN, WHITEBEAN, BEEFIGE, REDBEET, BLUEBERRY, BROCCOLI, CABBAGE, MELON, CARROT, CASEIN, CASHEWNUT, CAULIFLOWER, CELERY     Note: Lab results reviewed.  PFSH  Drug: Mr. Ziemann  reports no history of drug use. Alcohol:  reports no history of alcohol use. Tobacco:  reports that he has quit smoking. He has never used smokeless tobacco. Medical:  has a past medical history of Acute prostatitis, BPH (benign prostatic  hyperplasia), Chronic kidney disease, Dysplasia of prostate, Erectile dysfunction, HTN (hypertension), Hypogonadism in male, Orchitis and epididymitis, Over weight, Palindromic rheumatism, hand, Prostatitis, Rectum pain, Testicle swelling, Testicle tenderness, and Testicular mass. Family: family history includes Cancer in his maternal aunt.  Past Surgical History:  Procedure Laterality Date   BACK SURGERY     head surgery     from fall  drilled hole in brain to relieve pressure   Active Ambulatory Problems    Diagnosis Date Noted   BPH with obstruction/lower urinary tract symptoms 09/18/2014   Other male erectile dysfunction 09/18/2014   Enlarged prostate 03/25/2015   Urine stream spraying 03/25/2015   Sepsis ([place]) 07/19/2015   Essential hypertension 07/19/2015   Diabetes mellitus ([place]) 07/19/2015   Hypothyroidism 07/19/2015   Peripheral neuropathy 07/19/2015   Chronic back pain 07/19/2015   CKD (chronic kidney disease)  stage 3, GFR 30-59 ml/min ([facility]) 07/19/2015   Adrenal nodule ([place]) 10/28/2015   Sleep apnea 04/04/2014   Subdural hematoma 10/24/2015   Headache above the eye region 09/11/2019   COVID 02/19/2020   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   OSA (obstructive sleep apnea) 05/05/2020   Disorder of rotator cuff, left 06/10/2020   OSA on CPAP 08/25/2020   CPAP use counseling 08/25/2020   Resolved Ambulatory Problems    Diagnosis Date Noted   Hypogonadism in male 09/18/2014   Past Medical History:  Diagnosis Date   Acute prostatitis    BPH (benign prostatic hyperplasia)    Chronic kidney disease    Dysplasia of prostate    Erectile dysfunction    HTN (hypertension)    Orchitis and epididymitis    Over weight    Palindromic rheumatism, hand    Prostatitis    Rectum pain    Testicle swelling    Testicle tenderness    Testicular mass    Constitutional Exam  General appearance: Well nourished, well developed, and well  hydrated. In no apparent acute distress Vitals:   10/28/20 0953  BP: (!) 143/75  Pulse: 74  Resp: 16  Temp: (!) 96.8 F (36 C)  TempSrc: Temporal  SpO2: 100%  Weight: 225 lb 11.2 oz (102.4 kg)  Height: 6' (1.829 m)   BMI Assessment: Estimated body mass index is 30.61 kg/m as calculated from the following:   Height as of this encounter: 6' (1.829 m).   Weight as of this encounter: 225 lb 11.2 oz (102.4 kg).  BMI interpretation table: BMI level Category Range association with higher incidence of chronic pain  <18 kg/m2 Underweight   18.5-24.9 kg/m2 Ideal body weight   25-29.9 kg/m2 Overweight Increased incidence by 20%  30-34.9 kg/m2 Obese (Class I) Increased incidence by 68%  35-39.9 kg/m2 Severe obesity (Class II) Increased incidence by 136%  >40 kg/m2 Extreme obesity (Class III) Increased incidence by 254%   Patient's current BMI Ideal Body weight  Body mass index is 30.61 kg/m. Ideal body weight: 77.6 kg (171 lb 1.2 oz) Adjusted ideal body weight: 87.5 kg (192 lb 14.8 oz)   BMI Readings from Last 4 Encounters:  10/28/20 30.61 kg/m  10/02/20 29.82 kg/m  09/08/20 29.88 kg/m  08/25/20 29.96 kg/m   Wt Readings from Last 4 Encounters:  10/28/20 225 lb 11.2 oz (102.4 kg)  10/02/20 226 lb (102.5 kg)  09/08/20 226 lb 8 oz (102.7 kg)  08/25/20 227 lb 1.6 oz (103 kg)    Psych/Mental status: Alert, oriented x 3 (person, place, & time)       Eyes: PERLA Respiratory: No evidence of acute respiratory distress  Cervical Spine Area Exam  Skin & Axial Inspection: No masses, redness, edema, swelling, or associated skin lesions Alignment: Symmetrical Functional ROM: Unrestricted ROM      Stability: No instability detected Muscle Tone/Strength: Functionally intact. No obvious neuro-muscular anomalies detected. Sensory (Neurological): Musculoskeletal pain pattern Palpation: No palpable anomalies             Upper Extremity (UE) Exam    Side: Right upper extremity  Side:  Left upper extremity  Skin & Extremity Inspection: Skin color, temperature, and hair growth are WNL. No peripheral edema or cyanosis. No masses, redness, swelling, asymmetry, or associated skin lesions. No contractures.  Skin & Extremity Inspection: Skin color, temperature, and hair growth are WNL. No peripheral edema or cyanosis. No masses, redness, swelling, asymmetry, or associated skin lesions. No  contractures.  Functional ROM: Unrestricted ROM          Functional ROM: Pain restricted ROM          Muscle Tone/Strength: Functionally intact. No obvious neuro-muscular anomalies detected.  Muscle Tone/Strength: Functionally intact. No obvious neuro-muscular anomalies detected.  Sensory (Neurological): Unimpaired          Sensory (Neurological): Arthropathic arthralgia          Palpation: No palpable anomalies              Palpation: No palpable anomalies              Provocative Test(s):  Phalen's test: deferred Tinel's test: deferred Apley's scratch test (touch opposite shoulder):  Action 1 (Across chest): deferred Action 2 (Overhead): deferred Action 3 (LB reach): deferred   Provocative Test(s):  Phalen's test: deferred Tinel's test: deferred Apley's scratch test (touch opposite shoulder):  Action 1 (Across chest): Decreased ROM Action 2 (Overhead): Decreased ROM Action 3 (LB reach): Decreased ROM    Thoracic Spine Area Exam  Skin & Axial Inspection: No masses, redness, or swelling Alignment: Symmetrical Functional ROM: Unrestricted ROM Stability: No instability detected Muscle Tone/Strength: Functionally intact. No obvious neuro-muscular anomalies detected. Sensory (Neurological): Dermatomal pain pattern Muscle strength & Tone: No palpable anomalies  Lumbar Spine Area Exam  Skin & Axial Inspection: No masses, redness, or swelling Alignment: Symmetrical Functional ROM: Pain restricted ROM affecting both sides Stability: No instability detected Muscle Tone/Strength: Functionally  intact. No obvious neuro-muscular anomalies detected. Sensory (Neurological): Dermatomal pain pattern   Gait & Posture Assessment  Ambulation: Unassisted Gait: Relatively normal for age and body habitus Posture: WNL  Lower Extremity Exam    Side: Right lower extremity  Side: Left lower extremity  Stability: No instability observed          Stability: No instability observed          Skin & Extremity Inspection: Skin color, temperature, and hair growth are WNL. No peripheral edema or cyanosis. No masses, redness, swelling, asymmetry, or associated skin lesions. No contractures.  Skin & Extremity Inspection: Skin color, temperature, and hair growth are WNL. No peripheral edema or cyanosis. No masses, redness, swelling, asymmetry, or associated skin lesions. No contractures.  Functional ROM: Pain restricted ROM                  Functional ROM: Pain restricted ROM                  Muscle Tone/Strength: Functionally intact. No obvious neuro-muscular anomalies detected.  Muscle Tone/Strength: Functionally intact. No obvious neuro-muscular anomalies detected.  Sensory (Neurological): Dermatomal pain pattern        Sensory (Neurological): Dermatomal pain pattern        DTR: Patellar: deferred today Achilles: deferred today Plantar: deferred today  DTR: Patellar: deferred today Achilles: deferred today Plantar: deferred today  Palpation: No palpable anomalies  Palpation: No palpable anomalies    Assessment  Primary Diagnosis & Pertinent Problem List: The primary encounter diagnosis was Chronic radicular lumbar pain. Diagnoses of Lumbar radicular pain, Spinal stenosis, lumbar region, with neurogenic claudication, Lumbar spondylosis, Lumbar facet arthropathy, Lumbar degenerative disc disease, Chronic pain syndrome, and Encounter for long-term opiate analgesic use were also pertinent to this visit.  Visit Diagnosis (New problems to examiner): 1. Chronic radicular lumbar pain   2. Lumbar  radicular pain   3. Spinal stenosis, lumbar region, with neurogenic claudication   4. Lumbar spondylosis   5. Lumbar facet  arthropathy   6. Lumbar degenerative disc disease   7. Chronic pain syndrome   8. Encounter for long-term opiate analgesic use    Plan of Care (Initial workup plan)  Note: Mr. Cianci was reminded that as per protocol, today's visit has been an evaluation only. We have not taken over the patient's controlled substance management.  General Recommendations: The pain condition that the patient suffers from is best treated with a multidisciplinary approach that involves an increase in physical activity to prevent de-conditioning and worsening of the pain cycle, as well as psychological counseling (formal and/or informal) to address the co-morbid psychological affects of pain. Treatment will often involve judicious use of pain medications and interventional procedures to decrease the pain, allowing the patient to participate in the physical activity that will ultimately produce long-lasting pain reductions. The goal of the multidisciplinary approach is to return the patient to a higher level of overall function and to restore their ability to perform activities of daily living.  Lab Orders         Compliance Drug Analysis, Ur       Pharmacotherapy (current): Medications ordered:  No orders of the defined types were placed in this encounter.  Medications administered during this visit: Otelia Limes [name] had no medications administered during this visit.   Pharmacological management options:  Opioid Analgesics: The patient was informed that there is no guarantee that he would be a candidate for opioid analgesics. The decision will be made following CDC guidelines. This decision will be based on the results of diagnostic studies, as well as Mr. Malecha risk profile.   Membrane stabilizer: Adequate regimen Lyrica 100 mg BID  Muscle relaxant: To be determined at a  later time  NSAID:  takes [facility] Goody's powder which I counseled patient on  Other analgesic(s): To be determined at a later time   Interventional management options: Mr. Lepage was informed that there is no guarantee that he would be a candidate for interventional therapies. The decision will be based on the results of diagnostic studies, as well as Mr. Lamphier risk profile.  Procedure(s) under consideration:  L-ESI L-MBMB SI-J Shoulder: axillary, suprascapular NB   I spent a total of 60 minutes reviewing chart data, face-to-face evaluation with the patient, counseling and coordination of care as detailed above.    Provider-requested follow-up: Return in about 2 weeks (around 11/11/2020) for Medication Management, in person.  Future Appointments  Date Time Provider [place]  11/06/2020  9:40 AM Gillis Santa, MD [facility] None  09/29/2021  9:00 AM [facility] None  10/02/2021 11:30 AM McGowan, Hunt Oris, PA-C [facility] None    Note by: Gillis Santa, MD Date: 10/28/2020; Time: 11:24 AM

## 2020-10-28 NOTE — Progress Notes (Signed)
Safety precautions to be maintained throughout the outpatient stay will include: orient to surroundings, keep bed in low position, maintain call bell within reach at all times, provide assistance with transfer out of bed and ambulation.  

## 2020-11-02 LAB — COMPLIANCE DRUG ANALYSIS, UR

## 2020-11-06 ENCOUNTER — Ambulatory Visit
Payer: Medicare PPO | Attending: Student in an Organized Health Care Education/Training Program | Admitting: Student in an Organized Health Care Education/Training Program

## 2020-11-06 ENCOUNTER — Encounter: Payer: Self-pay | Admitting: Student in an Organized Health Care Education/Training Program

## 2020-11-06 ENCOUNTER — Other Ambulatory Visit: Payer: Self-pay

## 2020-11-06 VITALS — BP 131/73 | HR 67 | Temp 97.1°F | Resp 16 | Ht 73.0 in | Wt 225.0 lb

## 2020-11-06 DIAGNOSIS — Z0289 Encounter for other administrative examinations: Secondary | ICD-10-CM | POA: Diagnosis not present

## 2020-11-06 DIAGNOSIS — M75102 Unspecified rotator cuff tear or rupture of left shoulder, not specified as traumatic: Secondary | ICD-10-CM | POA: Diagnosis not present

## 2020-11-06 DIAGNOSIS — M19012 Primary osteoarthritis, left shoulder: Secondary | ICD-10-CM | POA: Insufficient documentation

## 2020-11-06 DIAGNOSIS — G894 Chronic pain syndrome: Secondary | ICD-10-CM | POA: Diagnosis not present

## 2020-11-06 DIAGNOSIS — M12812 Other specific arthropathies, not elsewhere classified, left shoulder: Secondary | ICD-10-CM | POA: Insufficient documentation

## 2020-11-06 DIAGNOSIS — M48062 Spinal stenosis, lumbar region with neurogenic claudication: Secondary | ICD-10-CM | POA: Diagnosis not present

## 2020-11-06 DIAGNOSIS — M25512 Pain in left shoulder: Secondary | ICD-10-CM | POA: Diagnosis not present

## 2020-11-06 DIAGNOSIS — G8929 Other chronic pain: Secondary | ICD-10-CM | POA: Insufficient documentation

## 2020-11-06 MED ORDER — OXYCODONE-ACETAMINOPHEN 7.5-325 MG PO TABS: 1.0000 | ORAL_TABLET | Freq: Three times a day (TID) | ORAL | 0 refills | Status: DC | PRN

## 2020-11-06 NOTE — Progress Notes (Signed)
Safety precautions to be maintained throughout the outpatient stay will include: orient to surroundings, keep bed in low position, maintain call bell within reach at all times, provide assistance with transfer out of bed and ambulation.  

## 2020-11-06 NOTE — Patient Instructions (Addendum)
Oxycodone/APAP to last until 01/05/2021 has been escribed to your pharmacy.

## 2020-11-06 NOTE — Progress Notes (Signed)
PROVIDER NOTE: Information contained herein reflects review and annotations entered in association with encounter. Interpretation of such information and data should be left to medically-trained personnel. Information provided to patient can be located elsewhere in the medical record under "Patient Instructions". Document created using STT-dictation technology, any transcriptional errors that may result from process are unintentional.    Patient: Paul Ramus [name]  Service Category: E/M  Provider: Gillis Santa, MD  DOB: [birth year]  DOS: 11/06/2020  Specialty: Interventional Pain Management  MRN: [id number]  Setting: Ambulatory outpatient  PCP: Cletis Athens, MD  Type: Established Patient    Referring Provider: Cletis Athens, MD  Location: Office  Delivery: Face-to-face     Primary Reason(s) for Visit: Encounter for evaluation before starting new chronic pain management plan of care (Level of risk: moderate) CC: Back Pain  HPI  Paul Briggs is a 75 y.o. year old, male patient, who comes today for a follow-up evaluation to review the test results and decide on a treatment plan. He has BPH with obstruction/lower urinary tract symptoms; Other male erectile dysfunction; Enlarged prostate; Urine stream spraying; Sepsis ([place]); Essential hypertension; Diabetes mellitus ([place]); Hypothyroidism; Peripheral neuropathy; Chronic back pain; CKD (chronic kidney disease) stage 3, GFR 30-59 ml/min ([place]); Adrenal nodule ([place]); Sleep apnea; Subdural hematoma; Headache above the eye region; COVID; Idiopathic sleep related nonobstructive alveolar hypoventilation; OSA (obstructive sleep apnea); Disorder of rotator cuff, left; OSA on CPAP; CPAP use counseling; Primary osteoarthritis of left shoulder; Left rotator cuff tear arthropathy; Spinal stenosis, lumbar region, with neurogenic claudication; Chronic left shoulder pain; Chronic pain syndrome; and Pain management contract signed on their problem list. His primarily  concern today is the Back Pain  Pain Assessment: Location: Right, Left Back Radiating: Pain radiaties down the back of his thigh down to his feet, at times he his pain on the inside of his foot Onset: More than a month ago Duration: Chronic pain Quality: Aching, Burning, Numbness, Constant, Throbbing Severity: 5 /10 (subjective, self-reported pain score)  Effect on ADL:   Timing: Constant Modifying factors: Meds BP: 131/73  HR: 67  Patient presents today for his second patient visit.  No change in medical history.  Reviewed his urine toxicology screen which was positive for oxycodone.  This was prescribed to the patient in the past by Orvan Falconer at [facility].  He states that he still has some tablets left from the prescription that he utilize.  I informed the patient of our pain clinic policy.  I encouraged him to read in detail.  Patient today is having fairly severe left shoulder pain related to left shoulder osteoarthritis and rotator cuff dysfunction.  He is unable to AB duct his shoulder.  Severe pain with overhead reach.  Discussed left anterior glenohumeral steroid joint injection.  Risk and benefits reviewed.  Patient like to proceed.  From initial clinic visit on 10/28/2020 Paul Briggs is a pleasant 75 year old male who presents with a chief complaint of left shoulder pain, low back pain that radiates down posterior lateral leg extending dermatomal fashion.  He has a history of lumbar neuroforaminal stenosis, lumbar spinal stenosis with neurogenic claudication, lumbar degenerative disc disease, lumbar spondylosis.  Of note, he was previously being seen at [facility].  He was being seen by Dr. Cristy Folks.  He was on chronic opioid therapy with Percocet 10 mg every 4-6 hours, 50 mg a day.  Dr. Cristy Folks left the practice as he moved to [place] and patient states that his interaction with the nurse practitioner has not  been pleasant.  She has accused him of diversion and patient felt  disrespected by this.  He works as a Education officer, museum and states that he does not take his medications when he is working and this is the reason that he did not have any oxycodone in his urine specimen will be tested.  He has tried lumbar epidural steroid injections which used to be effective but states that his most recent ones provided less duration of pain relief and were expensive.  He has done physical therapy for his low back and shoulder.  He is on Lyrica 100 mg twice a day.  He also takes [facility] Goody's powder.  His previous dose of oxycodone was 10 mg every 8 hours as needed that was prescribed by Dr. Chesley Noon who took over after Dr. Cristy Folks.    Controlled Substance Pharmacotherapy Assessment REMS (Risk Evaluation and Mitigation Strategy)  Analgesic: Percocet 7.5 mg 3 times daily as needed, quantity 90/month  Pill Count: None expected due to no prior prescriptions written by our practice. Chauncey Fischer, RN  11/06/2020 10:05 AM  Sign when Signing Visit Safety precautions to be maintained throughout the outpatient stay will include: orient to surroundings, keep bed in low position, maintain call bell within reach at all times, provide assistance with transfer out of bed and ambulation.      Pharmacokinetics: Liberation and absorption (onset of action): WNL Distribution (time to peak effect): WNL Metabolism and excretion (duration of action): WNL         Pharmacodynamics: Desired effects: Analgesia: [name] reports 50% benefit. Functional ability: Patient reports that medication allows him to accomplish basic ADLs Clinically meaningful improvement in function (CMIF): Sustained CMIF goals met Perceived effectiveness: Described as relatively effective, allowing for increase in activities of daily living (ADL) Undesirable effects: Side-effects or Adverse reactions: None reported Monitoring: [place] PMP: PDMP not reviewed this encounter. Online review of the past 37-monthperiod previously  conducted. Not applicable at this point since we have not taken over the patient's medication management yet. List of other Serum/Urine Drug Screening Test(s):  No results found for: AMPHSCRSER, BARBSCRSER, BENZOSCRSER, COCAINSCRSER, COCAINSCRNUR, PCPSCRSER, THCSCRSER, THCU, CANNABQUANT, [place] ECambridgeList of all UDS test(s) done:  Lab Results  Component Value Date   SUMMARY Note 10/28/2020   Last UDS on record: Summary  Date Value Ref Range Status  10/28/2020 Note  Final    Comment:    ==================================================================== Compliance Drug Analysis, Ur ==================================================================== Test                             Result       Flag       Units  Drug Present not Declared for Prescription Verification   Oxycodone                      85           UNEXPECTED ng/mg creat   Oxymorphone                    256          UNEXPECTED ng/mg creat   Noroxycodone                   318          UNEXPECTED ng/mg creat   Noroxymorphone  151          UNEXPECTED ng/mg creat    Sources of oxycodone are scheduled prescription medications.    Oxymorphone, noroxycodone, and noroxymorphone are expected    metabolites of oxycodone. Oxymorphone is also available as a    scheduled prescription medication.    Acetaminophen                  PRESENT      UNEXPECTED   Ibuprofen                      PRESENT      UNEXPECTED  Drug Absent but Declared for Prescription Verification   Tramadol                       Not Detected UNEXPECTED ng/mg creat   Pregabalin                     Not Detected UNEXPECTED   Cyclobenzaprine                Not Detected UNEXPECTED   Salicylate                     Not Detected UNEXPECTED    Salicylate, as indicated in the declared medication list, is not    always detected even when used as directed.     Aspirin, as indicated in the declared medication list, is not  always    detected even when used as directed.  ==================================================================== Test                      Result    Flag   Units      Ref Range   Creatinine              72               mg/dL      >=20 ==================================================================== Declared Medications:  The flagging and interpretation on this report are based on the  following declared medications.  Unexpected results may arise from  inaccuracies in the declared medications.   **Note: The testing scope of this panel includes these medications:   Cyclobenzaprine (Flexeril)  Pregabalin (Lyrica)  Tramadol (Ultram)   **Note: The testing scope of this panel does not include small to  moderate amounts of these reported medications:   Aspirin  Salicylate (Salicylamide)   **Note: The testing scope of this panel does not include the  following reported medications:   Amlodipine (Norvasc)  Caffeine  Doxycycline  Hydrochlorothiazide (Zestoretic)  Levothyroxine (Synthroid)  Lisinopril (Zestoretic)  Metformin (Glucophage)  Omeprazole (Prilosec)  Rosuvastatin (Crestor)  Tadalafil (Cialis)  Tamsulosin (Flomax)  Topical ==================================================================== For clinical consultation, please call [phone]. ====================================================================    UDS interpretation: Unexpected findings: Undeclared controlled substance detected.  Patient had prior prescription of oxycodone which he took the day of his appointment. Medication Assessment Form: Patient introduced to form today Treatment compliance: Treatment may start today if patient agrees with proposed plan. Evaluation of compliance is not applicable at this point Risk Assessment Profile: Aberrant behavior: See initial evaluations. None observed or detected today Comorbid factors increasing risk of overdose: See initial evaluation. No  additional risks detected today Opioid risk tool (ORT):  Opioid Risk  10/28/2020  Alcohol 3  Illegal Drugs 0  Rx Drugs 0  Alcohol 0  Illegal Drugs 0  Rx Drugs 0  Age between 40-45 years  0  History of Preadolescent Sexual Abuse 0  Psychological Disease 0  Depression 0  Opioid Risk Tool Scoring 3  Opioid Risk Interpretation Low Risk    ORT Scoring interpretation table:  Score <3 = Low Risk for SUD  Score between 4-7 = Moderate Risk for SUD  Score >8 = High Risk for Opioid Abuse   Risk of substance use disorder (SUD): Low  Risk Mitigation Strategies:  Patient opioid safety counseling: Completed today. Counseling provided to patient as per "Patient Counseling Document". Document signed by patient, attesting to counseling and understanding Patient-Prescriber Agreement (PPA): Obtained today.  Controlled substance notification to other providers: Written and sent today.  Pharmacologic Plan: Today we may be taking over the patient's pharmacological regimen. See below.             Laboratory Chemistry Profile   Renal Lab Results  Component Value Date   BUN 16 10/22/2015   CREATININE 1.25 (H) 10/22/2015   GFRAA >60 10/22/2015   GFRNONAA 57 (L) 10/22/2015   SPECGRAV 1.020 07/24/2020   PHUR 6.5 07/24/2020   PROTEINUR Positive (A) 07/24/2020     Electrolytes Lab Results  Component Value Date   NA 138 10/22/2015   K 3.3 (L) 10/22/2015   CL 107 10/22/2015   CALCIUM 8.8 (L) 10/22/2015     Hepatic Lab Results  Component Value Date   AST 47 (H) 10/22/2015   ALT 31 10/22/2015   ALBUMIN 3.8 10/22/2015   ALKPHOS 73 10/22/2015   LIPASE 357 02/25/2012     ID No results found for: LYMEIGGIGMAB, HIV, SARSCOV2NAA, STAPHAUREUS, MRSAPCR, HCVAB, PREGTESTUR, RMSFIGG, QFVRPH1IGG, QFVRPH2IGG, LYMEIGGIGMAB   Bone Lab Results  Component Value Date   TESTOSTERONE 429 04/19/2017     Endocrine Lab Results  Component Value Date   GLUCOSE 125 (H) 10/22/2015   GLUCOSEU 2+ (A)  10/04/2019   HGBA1C 7.1 (A) 07/08/2020   TSH 7.19 (H) 09/27/2011   TESTOSTERONE 429 04/19/2017     Neuropathy Lab Results  Component Value Date   HGBA1C 7.1 (A) 07/08/2020     CNS No results found for: COLORCSF, APPEARCSF, RBCCOUNTCSF, WBCCSF, POLYSCSF, LYMPHSCSF, EOSCSF, PROTEINCSF, GLUCCSF, JCVIRUS, CSFOLI, IGGCSF, LABACHR, ACETBL, LABACHR, ACETBL   Inflammation (CRP: Acute  ESR: Chronic) Lab Results  Component Value Date   ESRSEDRATE 36 (H) 07/20/2015   LATICACIDVEN 1.6 07/20/2015     Rheumatology No results found for: RF, ANA, LABURIC, URICUR, LYMEIGGIGMAB, LYMEABIGMQN, HLAB27   Coagulation Lab Results  Component Value Date   INR 1.04 10/22/2015   LABPROT 13.6 10/22/2015   APTT 33 10/22/2015   PLT 155 10/22/2015     Cardiovascular Lab Results  Component Value Date   CKTOTAL 599 (H) 06/16/2013   TROPONINI 0.03 (HH) 10/22/2015   HGB 11.6 (L) 10/22/2015   HCT 34.7 (L) 10/22/2015     Screening No results found for: SARSCOV2NAA, COVIDSOURCE, STAPHAUREUS, MRSAPCR, HCVAB, HIV, PREGTESTUR   Cancer No results found for: CEA, CA125, LABCA2   Allergens No results found for: ALMOND, APPLE, ASPARAGUS, AVOCADO, BANANA, BARLEY, BASIL, BAYLEAF, GREENBEAN, LIMABEAN, WHITEBEAN, BEEFIGE, REDBEET, BLUEBERRY, BROCCOLI, CABBAGE, MELON, CARROT, CASEIN, CASHEWNUT, CAULIFLOWER, CELERY     Note: Lab results reviewed.  Recent Diagnostic Imaging Review   CT CERVICAL SPINE WO CONTRAST  Narrative CLINICAL DATA:  Neck pain secondary to a fall.  EXAM: CT CERVICAL SPINE WITHOUT CONTRAST  TECHNIQUE: Multidetector CT imaging of the cervical spine was performed without intravenous contrast. Multiplanar CT image reconstructions were  also generated.  COMPARISON:  Radiographs dated 07/2013 and CT scan dated 12/23/2008  FINDINGS: No fracture or abnormal prevertebral soft tissue swelling. Alignment: No subluxation.  Skull base and vertebrae: Diffuse degenerative disc disease  with osteophyte formation in the vertebral endplates throughout the cervical spine. Slight osteophyte formation in the anterior arch of C1 and the odontoid with calcification of the transverse ligament.  Soft tissues and spinal canal: Calcification in the carotid arteries. Thyroid gland is atrophic with multiple tiny calcifications in the small right lobe. Diffuse narrowing of the AP dimension the spinal canal throughout the cervical spine.  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 Small broad-based disc bulges at each level, minimally progressed since 2010. No severe foraminal stenosis.  Upper chest: Normal.  IMPRESSION: 1. No acute abnormalities of the cervical spine. 2. Diffuse degenerative disc disease, minimally progressed since 2010.   Electronically Signed By: Lorriane Shire M.D. On: 10/22/2015 11:14   Narrative CLINICAL DATA:  Pain  EXAM: RIGHT SHOULDER - 2+ VIEW  COMPARISON:  None.  FINDINGS: Oblique, Y scapular, and axillary images were obtained. No evident fracture or dislocation. 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 There is mild narrowing of the glenohumeral joint. No erosive change or intra-articular calcification. Visualized right lung clear.  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 No fracture or dislocation.   Electronically Signed By: Lowella Grip III M.D. On: 04/04/2017 07:27 Lumbar MR wo contrast: Results for orders placed during the hospital encounter of 07/06/18  MR LUMBAR SPINE WO CONTRAST  Narrative CLINICAL DATA:  Initial evaluation for acute low back pain with left hip and buttock pain and left lower extremity numbness since fall 1 week ago. History of prior surgery.  EXAM: MRI LUMBAR SPINE  WITHOUT CONTRAST  TECHNIQUE: Multiplanar, multisequence MR imaging of the lumbar spine was performed. No intravenous contrast was administered.  COMPARISON:  Previous MRI from 03/28/2017.  FINDINGS: Segmentation: Transitional lumbosacral anatomy. Lowest well-formed disc will be labeled L5-S1. Same numbering system employed as on previous exam.  Alignment: 4 mm anterolisthesis of L4 on L5, stable. Trace retrolisthesis of L5 on S1. Appearance is stable from previous.  Vertebrae: Vertebral body height maintained without evidence for acute or chronic fracture. Bone marrow signal intensity diffusely decreased on T1 weighted imaging, most commonly related to anemia, smoking, or obesity. Superimposed 1 cm benign hemangioma within the L4 vertebral body. No worrisome osseous lesions. Mild reactive endplate changes noted about the right aspect of the L4-5 interspace.  Conus medullaris and cauda equina: Conus extends to the L1-2 level. Conus medullaris within normal limits. 6 mm nodular density involving the nerve roots of the cauda equina at the level of L2-3 noted (series 5, image 7), slightly more prominent relative to 2015, but similar as compared to previous study from 2019. 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  Paraspinal and other soft tissues: Paraspinous soft tissues demonstrate no acute finding. Multiple scattered T2 hyperintense simple right renal cyst noted. Visualized visceral structures otherwise unremarkable.  Disc levels:  L1-2: Negative interspace. Mild bilateral facet hypertrophy. No canal or foraminal stenosis. No impingement.  L2-3: Mild disc desiccation with minimal annular disc bulge. Mild-to-moderate facet and ligament flavum hypertrophy. No significant canal or lateral recess stenosis. Foramina remain patent.  L3-4: Chronic intervertebral disc space narrowing with diffuse disc  bulge and disc desiccation. Disc bulging centric  to the left. Moderate facet and ligament flavum hypertrophy with associated small bilateral joint effusions. Associated small synovial cyst noted at the posterior aspect of the L3-4 facets measuring up to 10 mm on the left and 9 mm on the right (series 8, image 20). Resultant severe canal with bilateral subarticular stenosis. Moderate bilateral L3 foraminal narrowing, left slightly worse than right.  L4-5: 4 mm anterolisthesis. Associated broad posterior disc bulge with disc desiccation and intervertebral disc space narrowing. Superimposed right foraminal/far lateral component with slight caudad angulation is relatively similar to previous (series 8, image 21). There is a new/more prominent more focal left subarticular/foraminal component with slight caudad angulation (series 8, image 21). Associated annular fissure. This disc component contacts the exiting left L4 nerve root in the left neural foramen (series 5, image 13). Severe left with moderate to severe right lateral recess stenosis, slightly worsened from previous. Residual mild narrowing of the central canal relatively unchanged. Moderate bilateral L4 foraminal stenosis, slightly worsened on the left, similar on the right.  L5-S1: Diffuse disc bulge with disc desiccation. Superimposed central disc protrusion with annular fissure. Previous left laminectomy. Mild to moderate facet hypertrophy. Residual moderate bilateral subarticular stenosis without narrowing of the central canal. Foramina remain patent.  IMPRESSION: 1. Diffuse disc bulge at L4-5 with new/more prominent left subarticular/foraminal component, with worsened moderate left foraminal and severe left lateral recess stenosis, potentially affecting either the left L4 or descending L5 nerve roots. Additional multifactorial degenerative changes at this level with resultant moderate to severe right lateral recess narrowing and moderate right L4 foraminal stenosis  relatively stable. 2. Disc bulging with advanced facet degeneration at L3-4 with resultant severe canal with moderate bilateral L3 foraminal stenosis, similar to previous. 3. Disc bulge with small central disc protrusion at L5-S1 with resultant moderate bilateral subarticular stenosis, also similar to previous. 4. 6 mm nodular density involving the nerve roots of the cauda equina at the level of L2-3, similar to previous, but slightly more prominent as compared to 2015. 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 While this is almost certainly benign, further assessment with postcontrast imaging suggested for complete assessment.   Electronically Signed By: Jeannine Boga M.D. On: 07/06/2018 23:44  DG HIP UNILAT WITH PELVIS 2-3 VIEWS RIGHT  Narrative CLINICAL DATA:  Right hip pain, numbness  EXAM: DG HIP (WITH OR WITHOUT PELVIS) 2-3V RIGHT  COMPARISON:  04/27/2015  FINDINGS: Mild degenerative changes in the right hip with joint space narrowing and spurring. Left hip joint space is maintained. SI joints are symmetric and unremarkable. No acute bony abnormality. Specifically, no fracture, subluxation, or dislocation.  IMPRESSION: Mild degenerative changes in the right hip. No acute bony abnormality.   Electronically Signed By: Rolm Baptise M.D. On: 02/23/2017 18:30   Complexity Note: Imaging results reviewed. Results shared with Mr. Corpuz, using Layman's terms.                         Meds   Current Outpatient Medications:    Alcohol Swabs (B-D SINGLE USE SWABS REGULAR) PADS, 100 each by Does not apply route daily., Disp: 100 each, Rfl: 6   amLODipine (NORVASC) 10 MG tablet, Take 1 tablet (10 mg total) by mouth daily., Disp: 90 tablet, Rfl: 0   Aspirin-Salicylamide-Caffeine (ARTHRITIS STRENGTH BC POWDER PO), Take by mouth., Disp: , Rfl:    Blood Glucose Calibration (TRUE METRIX LEVEL 1) Low SOLN, 1  Bottle by In Vitro route daily., Disp: 1  each, Rfl: 6   Blood Glucose Monitoring Suppl (TRUE METRIX METER) DEVI, Check blood sugar daily, Disp: 1 each, Rfl: 3   cyclobenzaprine (FLEXERIL) 5 MG tablet, Take 1 tablet (5 mg total) by mouth 3 (three) times daily as needed for muscle spasms., Disp: 30 tablet, Rfl: 1   glucose blood (TRUE METRIX PRO BLOOD GLUCOSE) test strip, Use as instructed, Disp: 300 each, Rfl: 3   Lancets 33G MISC, Check blood sugar daily, Disp: 300 each, Rfl: 3   levothyroxine (SYNTHROID) 175 MCG tablet, Take 1 tablet (175 mcg total) by mouth daily., Disp: 90 tablet, Rfl: 1   lisinopril-hydrochlorothiazide (ZESTORETIC) 20-12.5 MG tablet, TAKE 1 TABLET EVERY DAY, Disp: 90 tablet, Rfl: 3   metFORMIN (GLUCOPHAGE) 500 MG tablet, Take 1 tablet (500 mg total) by mouth 2 (two) times daily with a meal., Disp: 180 tablet, Rfl: 3   omeprazole (PRILOSEC) 20 MG capsule, TAKE 1 CAPSULE EVERY DAY, Disp: 90 capsule, Rfl: 3   oxyCODONE-acetaminophen (PERCOCET) 7.5-325 MG tablet, Take 1 tablet by mouth every 8 (eight) hours as needed for moderate pain or severe pain. Must last 30 days., Disp: 90 tablet, Rfl: 0   [START ON 12/06/2020] oxyCODONE-acetaminophen (PERCOCET) 7.5-325 MG tablet, Take 1 tablet by mouth every 8 (eight) hours as needed for moderate pain or severe pain. Must last 30 days., Disp: 90 tablet, Rfl: 0   pregabalin (LYRICA) 100 MG capsule, Take 1 capsule (100 mg total) by mouth 2 (two) times daily., Disp: 60 capsule, Rfl: 1   rosuvastatin (CRESTOR) 10 MG tablet, Take 1 tablet (10 mg total) by mouth daily., Disp: 90 tablet, Rfl: 3   tamsulosin (FLOMAX) 0.4 MG CAPS capsule, Take 1 capsule (0.4 mg total) by mouth daily., Disp: 90 capsule, Rfl: 3  ROS  Constitutional: Denies any fever or chills Gastrointestinal: No reported hemesis, hematochezia, vomiting, or acute GI distress Musculoskeletal: Denies any acute onset joint swelling, redness, loss of ROM, or weakness Neurological: No reported episodes of acute onset apraxia,  aphasia, dysarthria, agnosia, amnesia, paralysis, loss of coordination, or loss of consciousness  Allergies  Mr. Garfield is allergic to enalapril maleate and enalapril maleate.  PFSH  Drug: Mr. Haberland  reports no history of drug use. Alcohol:  reports no history of alcohol use. Tobacco:  reports that he has quit smoking. He has never used smokeless tobacco. Medical:  has a past medical history of Acute prostatitis, BPH (benign prostatic hyperplasia), Chronic kidney disease, Dysplasia of prostate, Erectile dysfunction, HTN (hypertension), Hypogonadism in male, Orchitis and epididymitis, Over weight, Palindromic rheumatism, hand, Prostatitis, Rectum pain, Testicle swelling, Testicle tenderness, and Testicular mass. Surgical: Mr. Simson  has a past surgical history that includes Back surgery and head surgery. Family: family history includes Cancer in his maternal aunt.  Constitutional Exam  General appearance: Well nourished, well developed, and well hydrated. In no apparent acute distress Vitals:   11/06/20 0948  BP: 131/73  Pulse: 67  Resp: 16  Temp: (!) 97.1 F (36.2 C)  TempSrc: Temporal  SpO2: 100%  Weight: 225 lb (102.1 kg)  Height: 6' 1"  (1.854 m)   BMI Assessment: Estimated body mass index is 29.69 kg/m as calculated from the following:   Height as of this encounter: 6' 1"  (1.854 m).   Weight as of this encounter: 225 lb (102.1 kg).  BMI interpretation table: BMI level Category Range association with higher incidence of chronic pain  <18 kg/m2 Underweight   18.5-24.9 kg/m2  Ideal body weight   25-29.9 kg/m2 Overweight Increased incidence by 20%  30-34.9 kg/m2 Obese (Class I) Increased incidence by 68%  35-39.9 kg/m2 Severe obesity (Class II) Increased incidence by 136%  >40 kg/m2 Extreme obesity (Class III) Increased incidence by 254%   Patient's current BMI Ideal Body weight  Body mass index is 29.69 kg/m. Ideal body weight: 79.9 kg (176 lb 2.4 oz) Adjusted  ideal body weight: 88.8 kg (195 lb 11 oz)   BMI Readings from Last 4 Encounters:  11/06/20 29.69 kg/m  10/28/20 30.61 kg/m  10/02/20 29.82 kg/m  09/08/20 29.88 kg/m   Wt Readings from Last 4 Encounters:  11/06/20 225 lb (102.1 kg)  10/28/20 225 lb 11.2 oz (102.4 kg)  10/02/20 226 lb (102.5 kg)  09/08/20 226 lb 8 oz (102.7 kg)    Psych/Mental status: Alert, oriented x 3 (person, place, & time)       Eyes: PERLA Respiratory: No evidence of acute respiratory distress    Cervical Spine Area Exam  Skin & Axial Inspection: No masses, redness, edema, swelling, or associated skin lesions Alignment: Symmetrical Functional ROM: Unrestricted ROM      Stability: No instability detected Muscle Tone/Strength: Functionally intact. No obvious neuro-muscular anomalies detected. Sensory (Neurological): Musculoskeletal pain pattern Palpation: No palpable anomalies             Upper Extremity (UE) Exam      Side: Right upper extremity   Side: Left upper extremity  Skin & Extremity Inspection: Skin color, temperature, and hair growth are WNL. No peripheral edema or cyanosis. No masses, redness, swelling, asymmetry, or associated skin lesions. No contractures.   Skin & Extremity Inspection: Skin color, temperature, and hair growth are WNL. No peripheral edema or cyanosis. No masses, redness, swelling, asymmetry, or associated skin lesions. No contractures.  Functional ROM: Unrestricted ROM           Functional ROM: Pain restricted ROM          Muscle Tone/Strength: Functionally intact. No obvious neuro-muscular anomalies detected.   Muscle Tone/Strength: Functionally intact. No obvious neuro-muscular anomalies detected.  Sensory (Neurological): Unimpaired           Sensory (Neurological): Arthropathic arthralgia          Palpation: No palpable anomalies               Palpation: No palpable anomalies              Provocative Test(s):  Phalen's test: deferred Tinel's test: deferred Apley's scratch  test (touch opposite shoulder):  Action 1 (Across chest): deferred Action 2 (Overhead): deferred Action 3 (LB reach): deferred     Provocative Test(s):  Phalen's test: deferred Tinel's test: deferred Apley's scratch test (touch opposite shoulder):  Action 1 (Across chest): Decreased ROM Action 2 (Overhead): Decreased ROM Action 3 (LB reach): Decreased ROM      Thoracic Spine Area Exam  Skin & Axial Inspection: No masses, redness, or swelling Alignment: Symmetrical Functional ROM: Unrestricted ROM Stability: No instability detected Muscle Tone/Strength: Functionally intact. No obvious neuro-muscular anomalies detected. Sensory (Neurological): Dermatomal pain pattern Muscle strength & Tone: No palpable anomalies   Lumbar Spine Area Exam  Skin & Axial Inspection: No masses, redness, or swelling Alignment: Symmetrical Functional ROM: Pain restricted ROM affecting both sides Stability: No instability detected Muscle Tone/Strength: Functionally intact. No obvious neuro-muscular anomalies detected. Sensory (Neurological): Dermatomal pain pattern     Gait & Posture Assessment  Ambulation: Unassisted Gait: Relatively normal for age  and body habitus Posture: WNL  Lower Extremity Exam      Side: Right lower extremity   Side: Left lower extremity  Stability: No instability observed           Stability: No instability observed          Skin & Extremity Inspection: Skin color, temperature, and hair growth are WNL. No peripheral edema or cyanosis. No masses, redness, swelling, asymmetry, or associated skin lesions. No contractures.   Skin & Extremity Inspection: Skin color, temperature, and hair growth are WNL. No peripheral edema or cyanosis. No masses, redness, swelling, asymmetry, or associated skin lesions. No contractures.  Functional ROM: Pain restricted ROM                   Functional ROM: Pain restricted ROM                  Muscle Tone/Strength: Functionally intact. No obvious  neuro-muscular anomalies detected.   Muscle Tone/Strength: Functionally intact. No obvious neuro-muscular anomalies detected.  Sensory (Neurological): Dermatomal pain pattern         Sensory (Neurological): Dermatomal pain pattern          Assessment & Plan  Primary Diagnosis & Pertinent Problem List: The primary encounter diagnosis was Primary osteoarthritis of left shoulder. Diagnoses of Chronic left shoulder pain, Left rotator cuff tear arthropathy, Spinal stenosis, lumbar region, with neurogenic claudication, Pain management contract signed, and Chronic pain syndrome were also pertinent to this visit.  Visit Diagnosis: 1. Primary osteoarthritis of left shoulder   2. Chronic left shoulder pain   3. Left rotator cuff tear arthropathy   4. Spinal stenosis, lumbar region, with neurogenic claudication   5. Pain management contract signed   6. Chronic pain syndrome    Problems updated and reviewed during this visit: Problem  Primary Osteoarthritis of Left Shoulder  Left Rotator Cuff Tear Arthropathy  Spinal Stenosis, Lumbar Region, With Neurogenic Claudication  Chronic Left Shoulder Pain  Chronic Pain Syndrome  Pain Management Contract Signed    Plan of Care  1. Primary osteoarthritis of left shoulder - oxyCODONE-acetaminophen (PERCOCET) 7.5-325 MG tablet; Take 1 tablet by mouth every 8 (eight) hours as needed for moderate pain or severe pain. Must last 30 days.  Dispense: 90 tablet; Refill: 0 - oxyCODONE-acetaminophen (PERCOCET) 7.5-325 MG tablet; Take 1 tablet by mouth every 8 (eight) hours as needed for moderate pain or severe pain. Must last 30 days.  Dispense: 90 tablet; Refill: 0 - SHOULDER INJECTION; Future  2. Chronic left shoulder pain - oxyCODONE-acetaminophen (PERCOCET) 7.5-325 MG tablet; Take 1 tablet by mouth every 8 (eight) hours as needed for moderate pain or severe pain. Must last 30 days.  Dispense: 90 tablet; Refill: 0 - oxyCODONE-acetaminophen (PERCOCET) 7.5-325 MG  tablet; Take 1 tablet by mouth every 8 (eight) hours as needed for moderate pain or severe pain. Must last 30 days.  Dispense: 90 tablet; Refill: 0 - SHOULDER INJECTION; Future  3. Left rotator cuff tear arthropathy - oxyCODONE-acetaminophen (PERCOCET) 7.5-325 MG tablet; Take 1 tablet by mouth every 8 (eight) hours as needed for moderate pain or severe pain. Must last 30 days.  Dispense: 90 tablet; Refill: 0 - oxyCODONE-acetaminophen (PERCOCET) 7.5-325 MG tablet; Take 1 tablet by mouth every 8 (eight) hours as needed for moderate pain or severe pain. Must last 30 days.  Dispense: 90 tablet; Refill: 0 - SHOULDER INJECTION; Future  4. Spinal stenosis, lumbar region, with neurogenic claudication - oxyCODONE-acetaminophen (PERCOCET) 7.5-325 MG  tablet; Take 1 tablet by mouth every 8 (eight) hours as needed for moderate pain or severe pain. Must last 30 days.  Dispense: 90 tablet; Refill: 0 - oxyCODONE-acetaminophen (PERCOCET) 7.5-325 MG tablet; Take 1 tablet by mouth every 8 (eight) hours as needed for moderate pain or severe pain. Must last 30 days.  Dispense: 90 tablet; Refill: 0  5. Pain management contract signed  6. Chronic pain syndrome - oxyCODONE-acetaminophen (PERCOCET) 7.5-325 MG tablet; Take 1 tablet by mouth every 8 (eight) hours as needed for moderate pain or severe pain. Must last 30 days.  Dispense: 90 tablet; Refill: 0 - oxyCODONE-acetaminophen (PERCOCET) 7.5-325 MG tablet; Take 1 tablet by mouth every 8 (eight) hours as needed for moderate pain or severe pain. Must last 30 days.  Dispense: 90 tablet; Refill: 0 - SHOULDER INJECTION; Future    Pharmacotherapy (Medications Ordered): Meds ordered this encounter  Medications   oxyCODONE-acetaminophen (PERCOCET) 7.5-325 MG tablet    Sig: Take 1 tablet by mouth every 8 (eight) hours as needed for moderate pain or severe pain. Must last 30 days.    Dispense:  90 tablet    Refill:  0    Chronic Pain: STOP Act (Not applicable) Fill 1  day early if closed on refill date. Avoid benzodiazepines within 8 hours of opioids   oxyCODONE-acetaminophen (PERCOCET) 7.5-325 MG tablet    Sig: Take 1 tablet by mouth every 8 (eight) hours as needed for moderate pain or severe pain. Must last 30 days.    Dispense:  90 tablet    Refill:  0    Chronic Pain: STOP Act (Not applicable) Fill 1 day early if closed on refill date. Avoid benzodiazepines within 8 hours of opioids   Procedure Orders         SHOULDER INJECTION      Pharmacological management options:  Opioid Analgesics: As above  Membrane stabilizer: Adequate regimen Lyrica 100 mg BID  Muscle relaxant: To be determined at a later time  NSAID:  takes [facility] Goody's powder which I counseled patient on  Other analgesic(s): To be determined at a later time    Interventional management options: Mr. Heyer was informed that there is no guarantee that he would be a candidate for interventional therapies. The decision will be based on the results of diagnostic studies, as well as Mr. Ramsaran risk profile.  Procedure(s) under consideration:  L-ESI L-[id number] SI-J Shoulder: axillary, suprascapular NB     Provider-requested follow-up: Return in about 4 days (around 11/10/2020) for left shoulder injection , without sedation. Recent Visits Date Type Provider Dept  10/28/20 Office Visit Gillis Santa, MD [facility]  Showing recent visits within past 90 days and meeting all other requirements Today's Visits Date Type Provider Dept  11/06/20 Office Visit Gillis Santa, MD [facility]  Showing today's visits and meeting all other requirements Future Appointments Date Type Provider Dept  11/10/20 Appointment Gillis Santa, MD [facility]  12/30/20 Appointment Gillis Santa, MD [facility]  Showing future appointments within next 90 days and meeting all other requirements Primary Care Physician: Cletis Athens, MD Note by: Gillis Santa, MD Date:  11/06/2020; Time: 11:47 AM

## 2020-11-10 ENCOUNTER — Other Ambulatory Visit: Payer: Self-pay

## 2020-11-10 ENCOUNTER — Encounter: Payer: Self-pay | Admitting: Student in an Organized Health Care Education/Training Program

## 2020-11-10 ENCOUNTER — Ambulatory Visit (HOSPITAL_BASED_OUTPATIENT_CLINIC_OR_DEPARTMENT_OTHER): Payer: Medicare PPO | Admitting: Student in an Organized Health Care Education/Training Program

## 2020-11-10 ENCOUNTER — Ambulatory Visit
Admission: RE | Admit: 2020-11-10 | Discharge: 2020-11-10 | Disposition: A | Discharge status: Home / Self Care | Payer: Medicare PPO | Source: Ambulatory Visit | Attending: Student in an Organized Health Care Education/Training Program | Admitting: Student in an Organized Health Care Education/Training Program

## 2020-11-10 VITALS — BP 143/74 | HR 58 | Temp 97.3°F | Resp 10 | Ht 73.0 in | Wt 225.0 lb

## 2020-11-10 DIAGNOSIS — M19012 Primary osteoarthritis, left shoulder: Secondary | ICD-10-CM | POA: Insufficient documentation

## 2020-11-10 DIAGNOSIS — G8929 Other chronic pain: Secondary | ICD-10-CM | POA: Insufficient documentation

## 2020-11-10 DIAGNOSIS — M75102 Unspecified rotator cuff tear or rupture of left shoulder, not specified as traumatic: Secondary | ICD-10-CM

## 2020-11-10 DIAGNOSIS — M25512 Pain in left shoulder: Secondary | ICD-10-CM

## 2020-11-10 DIAGNOSIS — M12812 Other specific arthropathies, not elsewhere classified, left shoulder: Secondary | ICD-10-CM | POA: Insufficient documentation

## 2020-11-10 MED ORDER — METHYLPREDNISOLONE ACETATE 40 MG/ML IJ SUSP
40.0000 mg | Freq: Once | INTRAMUSCULAR | Status: AC
  Administered 2020-11-10: 40 mg via INTRA_ARTICULAR

## 2020-11-10 MED ORDER — METHYLPREDNISOLONE ACETATE 40 MG/ML IJ SUSP
INTRAMUSCULAR | Status: AC
  Filled 2020-11-10: qty 1

## 2020-11-10 MED ORDER — ROPIVACAINE HCL 2 MG/ML IJ SOLN
4.0000 mL | Freq: Once | INTRAMUSCULAR | Status: AC
  Administered 2020-11-10: 20 mL via INTRA_ARTICULAR

## 2020-11-10 MED ORDER — LIDOCAINE HCL 2 % IJ SOLN
20.0000 mL | Freq: Once | INTRAMUSCULAR | Status: AC
  Administered 2020-11-10: 200 mg

## 2020-11-10 MED ORDER — LIDOCAINE HCL 2 % IJ SOLN
INTRAMUSCULAR | Status: AC
  Filled 2020-11-10: qty 10

## 2020-11-10 MED ORDER — IOHEXOL 180 MG/ML  SOLN
10.0000 mL | Freq: Once | INTRAMUSCULAR | Status: AC
  Administered 2020-11-10: 5 mL via INTRA_ARTICULAR

## 2020-11-10 MED ORDER — ROPIVACAINE HCL 2 MG/ML IJ SOLN
INTRAMUSCULAR | Status: AC
  Filled 2020-11-10: qty 20

## 2020-11-10 MED ORDER — IOHEXOL 180 MG/ML  SOLN
INTRAMUSCULAR | Status: AC
  Filled 2020-11-10: qty 20

## 2020-11-10 NOTE — Progress Notes (Signed)
PROVIDER NOTE: Information contained herein reflects review and annotations entered in association with encounter. Interpretation of such information and data should be left to medically-trained personnel. Information provided to patient can be located elsewhere in the medical record under "Patient Instructions". Document created using STT-dictation technology, any transcriptional errors that may result from process are unintentional.    Patient: Paul Ramus [name]  Service Category: Procedure  Provider: Gillis Santa, MD  DOB: 12/22/1945  DOS: 11/10/2020  Location: [facility]  MRN: [id number]  Setting: Ambulatory - outpatient  Referring Provider: Cletis Athens, MD  Type: Established Patient  Specialty: Interventional Pain Management  PCP: Cletis Athens, MD   Primary Reason for Visit: Interventional Pain Management Treatment. CC: Shoulder Pain (LEFT)    Procedure:          Anesthesia, Analgesia, Anxiolysis:  Type: Diagnostic Glenohumeral Joint (shoulder) Injection          Primary Purpose: Diagnostic Region: Superior Shoulder Area Level:  Shoulder Target Area: Glenohumeral Joint (shoulder) Approach: Anterior approach. Laterality: Left  Type: Local Anesthesia Local Anesthetic: Lidocaine 1-2%   Position: Supine   Indications: 1. Primary osteoarthritis of left shoulder   2. Chronic left shoulder pain   3. Left rotator cuff tear arthropathy    Pain Score: Pre-procedure: 4 /10 Post-procedure: 3 /10     Pre-op H&P Assessment:  Paul Briggs is a 75 y.o. (year old), male patient, seen today for interventional treatment. He  has a past surgical history that includes Back surgery and head surgery. Paul Briggs has a current medication list which includes the following prescription(s): b-d single use swabs regular, amlodipine, aspirin-salicylamide-caffeine, true metrix level 1, true metrix meter, cyclobenzaprine, true metrix pro blood glucose, lancets 33g,  levothyroxine, lisinopril-hydrochlorothiazide, metformin, omeprazole, oxycodone-acetaminophen, [START ON 12/06/2020] oxycodone-acetaminophen, pregabalin, rosuvastatin, and tamsulosin. His primarily concern today is the Shoulder Pain (LEFT)  Initial Vital Signs:  Pulse/HCG Rate:  (!) 58 ECG Heart Rate: (!) 54 Temp: (!) 97.3 F (36.3 C) Resp: 16 BP: (!) 182/71 SpO2: 98 %  BMI: Estimated body mass index is 29.69 kg/m as calculated from the following:   Height as of this encounter: 6\' 1"  (1.854 m).   Weight as of this encounter: 225 lb (102.1 kg).  Risk Assessment: Allergies: Reviewed. He is allergic to enalapril maleate.  Allergy Precautions: None required Coagulopathies: Reviewed. None identified.  Blood-thinner therapy: None at this time Active Infection(s): Reviewed. None identified. Paul Briggs is afebrile  Site Confirmation: Paul Briggs was asked to confirm the procedure and laterality before marking the site Procedure checklist: Completed Consent: Before the procedure and under the influence of no sedative(s), amnesic(s), or anxiolytics, the patient was informed of the treatment options, risks and possible complications. To fulfill our ethical and legal obligations, as recommended by the American Medical Association's Code of Ethics, I have informed the patient of my clinical impression; the nature and purpose of the treatment or procedure; the risks, benefits, and possible complications of the intervention; the alternatives, including doing nothing; the risk(s) and benefit(s) of the alternative treatment(s) or procedure(s); and the risk(s) and benefit(s) of doing nothing. The patient was provided information about the general risks and possible complications associated with the procedure. These may include, but are not limited to: failure to achieve desired goals, infection, bleeding, organ or nerve damage, allergic reactions, paralysis, and death. In addition, the patient was  informed of those risks and complications associated to the procedure, such as failure to decrease pain; infection; bleeding; organ or  nerve damage with subsequent damage to sensory, motor, and/or autonomic systems, resulting in permanent pain, numbness, and/or weakness of one or several areas of the body; allergic reactions; (i.e.: anaphylactic reaction); and/or death. Furthermore, the patient was informed of those risks and complications associated with the medications. These include, but are not limited to: allergic reactions (i.e.: anaphylactic or anaphylactoid reaction(s)); adrenal axis suppression; blood sugar elevation that in diabetics may result in ketoacidosis or comma; water retention that in patients with history of congestive heart failure may result in shortness of breath, pulmonary edema, and decompensation with resultant heart failure; weight gain; swelling or edema; medication-induced neural toxicity; particulate matter embolism and blood vessel occlusion with resultant organ, and/or nervous system infarction; and/or aseptic necrosis of one or more joints. Finally, the patient was informed that Medicine is not an exact science; therefore, there is also the possibility of unforeseen or unpredictable risks and/or possible complications that may result in a catastrophic outcome. The patient indicated having understood very clearly. We have given the patient no guarantees and we have made no promises. Enough time was given to the patient to ask questions, all of which were answered to the patient's satisfaction. Paul Briggs has indicated that he wanted to continue with the procedure. Attestation: I, the ordering provider, attest that I have discussed with the patient the benefits, risks, side-effects, alternatives, likelihood of achieving goals, and potential problems during recovery for the procedure that I have provided informed consent. Date  Time: 11/10/2020  8:42 AM  Pre-Procedure  Preparation:  Monitoring: As per clinic protocol. Respiration, ETCO2, SpO2, BP, heart rate and rhythm monitor placed and checked for adequate function Safety Precautions: Patient was assessed for positional comfort and pressure points before starting the procedure. Time-out: I initiated and conducted the "Time-out" before starting the procedure, as per protocol. The patient was asked to participate by confirming the accuracy of the "Time Out" information. Verification of the correct person, site, and procedure were performed and confirmed by me, the nursing staff, and the patient. "Time-out" conducted as per Joint Commission's Universal Protocol (UP.01.01.01). Time: 0908  Description of Procedure:          Area Prepped: Entire shoulder Area DuraPrep (Iodine Povacrylex [0.7% available iodine] and Isopropyl Alcohol, 74% w/w) Safety Precautions: Aspiration looking for blood return was conducted prior to all injections. At no point did we inject any substances, as a needle was being advanced. No attempts were made at seeking any paresthesias. Safe injection practices and needle disposal techniques used. Medications properly checked for expiration dates. SDV (single dose vial) medications used. Description of the Procedure: Protocol guidelines were followed. The patient was placed in position over the procedure table. The target area was identified and the area prepped in the usual manner. Skin & deeper tissues infiltrated with local anesthetic. Appropriate amount of time allowed to pass for local anesthetics to take effect. The procedure needles were then advanced to the target area. Proper needle placement secured. Negative aspiration confirmed. Solution injected in intermittent fashion, asking for systemic symptoms every 0.5cc of injectate. The needles were then removed and the area cleansed, making sure to leave some of the prepping solution back to take advantage of its long term bactericidal  properties.         Vitals:   11/10/20 0844 11/10/20 0847 11/10/20 0910 11/10/20 0913  BP: (!) 182/71 135/72 (!) 147/76 (!) 143/74  Pulse: (!) 58     Resp: 16  12 10   Temp: (!) 97.3 F (36.3  C)     TempSrc: Temporal     SpO2: 98%  98% 98%  Weight: 225 lb (102.1 kg)     Height: 6\' 1"  (1.854 m)       Start Time: 0909 hrs. End Time: 0913 hrs. Materials:  Needle(s) Type: Spinal Needle Gauge: 25G Length: 3.5-in Medication(s): Please see orders for medications and dosing details. 5cc solution made of 4cc of  0.2% ropivacaine, 1 cc of methylprednisolone, 40 mg/cc  Imaging Guidance (Non-Spinal):          Type of Imaging Technique: Fluoroscopy Guidance (Non-Spinal) Indication(s): Assistance in needle guidance and placement for procedures requiring needle placement in or near specific anatomical locations not easily accessible without such assistance. Exposure Time: Please see nurses notes. Contrast: Before injecting any contrast, we confirmed that the patient did not have an allergy to iodine, shellfish, or radiological contrast. Once satisfactory needle placement was completed at the desired level, radiological contrast was injected. Contrast injected under live fluoroscopy. No contrast complications. See chart for type and volume of contrast used. Fluoroscopic Guidance: I was personally present during the use of fluoroscopy. "Tunnel Vision Technique" used to obtain the best possible view of the target area. Parallax error corrected before commencing the procedure. "Direction-depth-direction" technique used to introduce the needle under continuous pulsed fluoroscopy. Once target was reached, antero-posterior, oblique, and lateral fluoroscopic projection used confirm needle placement in all planes. Images permanently stored in EMR. Interpretation: I personally interpreted the imaging intraoperatively. Adequate needle placement confirmed in multiple planes. Appropriate spread of contrast into  desired area was observed. No evidence of afferent or efferent intravascular uptake. Permanent images saved into the patient's record.  Post-operative Assessment:  Post-procedure Vital Signs:  Pulse/HCG Rate:  (!) 58 (!) 54 Temp:  (!) 97.3 F (36.3 C) Resp: 10 BP:  (!) 143/74 SpO2: 98 %  EBL: None  Complications: No immediate post-treatment complications observed by team, or reported by patient.  Note: The patient tolerated the entire procedure well. A repeat set of vitals were taken after the procedure and the patient was kept under observation following institutional policy, for this type of procedure. Post-procedural neurological assessment was performed, showing return to baseline, prior to discharge. The patient was provided with post-procedure discharge instructions, including a section on how to identify potential problems. Should any problems arise concerning this procedure, the patient was given instructions to immediately contact us, at any time, without hesitation. In any case, we plan to contact the patient by telephone for a follow-up status report regarding this interventional procedure.  Comments:  No additional relevant information.  Plan of Care  Orders:  Orders Placed This Encounter  Procedures   DG PAIN [facility] C-ARM 1-60 MIN NO REPORT    Intraoperative interpretation by procedural physician at [facility].    Standing Status:   Standing    Number of Occurrences:   1    Order Specific Question:   Reason for exam:    Answer:   Assistance in needle guidance and placement for procedures requiring needle placement in or near specific anatomical locations not easily accessible without such assistance.   Chronic Opioid Analgesic:  Percocet 7.5 mg 3 times daily as needed, quantity 90/month   Medications ordered for procedure: Meds ordered this encounter  Medications   iohexol (OMNIPAQUE) 180 MG/ML injection 10 mL    Must be Myelogram-compatible. If not  available, you may substitute with a water-soluble, non-ionic, hypoallergenic, myelogram-compatible radiological contrast medium.   lidocaine (XYLOCAINE) 2 % (  with pres) injection 400 mg   methylPREDNISolone acetate (DEPO-MEDROL) injection 40 mg   ropivacaine (PF) 2 mg/mL (0.2%) (NAROPIN) injection 4 mL   Medications administered: We administered iohexol, lidocaine, methylPREDNISolone acetate, and ropivacaine (PF) 2 mg/mL (0.2%).  See the medical record for exact dosing, route, and time of administration.  Follow-up plan:   Return in about 4 weeks (around 12/08/2020) for Post Procedure Evaluation, virtual.       Left shoulder injection 11/10/20  Recent Visits Date Type Provider Dept  11/06/20 Office Visit Gillis Santa, MD [facility]  10/28/20 Office Visit Gillis Santa, MD [facility]  Showing recent visits within past 90 days and meeting all other requirements Today's Visits Date Type Provider Dept  11/10/20 Procedure visit Gillis Santa, MD [facility]  Showing today's visits and meeting all other requirements Future Appointments Date Type Provider Dept  12/08/20 Appointment Gillis Santa, MD [facility]  12/30/20 Appointment Gillis Santa, MD [facility]  Showing future appointments within next 90 days and meeting all other requirements Disposition: Discharge home  Discharge (Date  Time): 11/10/2020; 0920 hrs.   Primary Care Physician: Cletis Athens, MD Location: [facility] Note by: Gillis Santa, MD Date: 11/10/2020; Time: 9:40 AM  Disclaimer:  Medicine is not an exact science. The only guarantee in medicine is that nothing is guaranteed. It is important to note that the decision to proceed with this intervention was based on the information collected from the patient. The Data and conclusions were drawn from the patient's questionnaire, the interview, and the physical examination. Because the  information was provided in large part by the patient, it cannot be guaranteed that it has not been purposely or unconsciously manipulated. Every effort has been made to obtain as much relevant data as possible for this evaluation. It is important to note that the conclusions that lead to this procedure are derived in large part from the available data. Always take into account that the treatment will also be dependent on availability of resources and existing treatment guidelines, considered by other Pain Management Practitioners as being common knowledge and practice, at the time of the intervention. For Medico-Legal purposes, it is also important to point out that variation in procedural techniques and pharmacological choices are the acceptable norm. The indications, contraindications, technique, and results of the above procedure should only be interpreted and judged by a Board-Certified Interventional Pain Specialist with extensive familiarity and expertise in the same exact procedure and technique.

## 2020-11-10 NOTE — Progress Notes (Signed)
Safety precautions to be maintained throughout the outpatient stay will include: orient to surroundings, keep bed in low position, maintain call bell within reach at all times, provide assistance with transfer out of bed and ambulation.  

## 2020-11-10 NOTE — Patient Instructions (Signed)

## 2020-11-11 ENCOUNTER — Telehealth: Payer: Self-pay | Admitting: *Deleted

## 2020-11-11 NOTE — Telephone Encounter (Signed)
Denies any post procedure issues. 

## 2020-11-13 DIAGNOSIS — U071 COVID-19: Secondary | ICD-10-CM | POA: Diagnosis not present

## 2020-11-27 DIAGNOSIS — G4733 Obstructive sleep apnea (adult) (pediatric): Secondary | ICD-10-CM | POA: Diagnosis not present

## 2020-12-04 ENCOUNTER — Encounter: Payer: Self-pay | Admitting: Student in an Organized Health Care Education/Training Program

## 2020-12-08 ENCOUNTER — Encounter: Payer: Self-pay | Admitting: Student in an Organized Health Care Education/Training Program

## 2020-12-08 ENCOUNTER — Ambulatory Visit
Payer: Medicare PPO | Attending: Student in an Organized Health Care Education/Training Program | Admitting: Student in an Organized Health Care Education/Training Program

## 2020-12-08 ENCOUNTER — Other Ambulatory Visit: Payer: Self-pay

## 2020-12-08 DIAGNOSIS — M75102 Unspecified rotator cuff tear or rupture of left shoulder, not specified as traumatic: Secondary | ICD-10-CM

## 2020-12-08 DIAGNOSIS — M25512 Pain in left shoulder: Secondary | ICD-10-CM

## 2020-12-08 DIAGNOSIS — M19012 Primary osteoarthritis, left shoulder: Secondary | ICD-10-CM

## 2020-12-08 DIAGNOSIS — G8929 Other chronic pain: Secondary | ICD-10-CM

## 2020-12-08 DIAGNOSIS — M12812 Other specific arthropathies, not elsewhere classified, left shoulder: Secondary | ICD-10-CM | POA: Diagnosis not present

## 2020-12-08 NOTE — Progress Notes (Signed)
Patient: Paul Ramus [name]  Service Category: E/M  Provider: Gillis Santa, MD  DOB: 09/18/1945  DOS: 12/08/2020  Location: Office  MRN: [id number]  Setting: Ambulatory outpatient  Referring Provider: Cletis Athens, MD  Type: Established Patient  Specialty: Interventional Pain Management  PCP: Cletis Athens, MD  Location: Home  Delivery: TeleHealth     Virtual Encounter - Pain Management PROVIDER NOTE: Information contained herein reflects review and annotations entered in association with encounter. Interpretation of such information and data should be left to medically-trained personnel. Information provided to patient can be located elsewhere in the medical record under "Patient Instructions". Document created using STT-dictation technology, any transcriptional errors that may result from process are unintentional.    Contact & Pharmacy Preferred: [phone] Home: [phone] (home) Mobile: [phone] (mobile) E-mail: [email or web address]  [place] ([address]), [place]) [place] [address] Phone: [phone] Fax: [place], [address] [place] [address] Phone: [phone] Fax: [phone]  [place], [place] [address] Phone: [phone] Fax: [phone]   Pre-screening  Paul Briggs offered "in-person" vs "virtual" encounter. He indicated preferring virtual for this encounter.   Reason COVID-19*  Social distancing based on CDC and AMA recommendations.   I contacted Paul Retzloff [name] on 12/08/2020 via telephone.      I clearly identified myself as Gillis Santa, MD. I verified that I was speaking with the correct person using two identifiers (Name: Paul Enderle [name], and date of birth:  Jun 16, 1945).  Consent I sought verbal advanced consent from Paul Briggs. for virtual visit interactions. I informed Paul Briggs of possible security and privacy concerns, risks, and limitations associated with providing "not-in-person" medical evaluation and management services. I also informed Paul Briggs of the availability of "in-person" appointments. Finally, I informed him that there would be a charge for the virtual visit and that he could be  personally, fully or partially, financially responsible for it. Paul Briggs expressed understanding and agreed to proceed.   Historic Elements   [name] is a 75 y.o. year old, male patient evaluated today after our last contact on 11/10/2020. Paul Briggs  has a past medical history of Acute prostatitis, BPH (benign prostatic hyperplasia), Chronic kidney disease, Dysplasia of prostate, Erectile dysfunction, HTN (hypertension), Hypogonadism in male, Orchitis and epididymitis, Over weight, Palindromic rheumatism, hand, Prostatitis, Rectum pain, Testicle swelling, Testicle tenderness, and Testicular mass. He also  has a past surgical history that includes Back surgery and head surgery. Paul Briggs has a current medication list which includes the following prescription(s): b-d single use swabs regular, amlodipine, aspirin-salicylamide-caffeine, true metrix level 1, true metrix meter, cyclobenzaprine, true metrix pro blood glucose, lancets 33g, levothyroxine, lisinopril-hydrochlorothiazide, metformin, omeprazole, oxycodone-acetaminophen, pregabalin, rosuvastatin, and tamsulosin. He  reports that he has quit smoking. He has never used smokeless tobacco. He reports that he does not drink alcohol and does not use drugs. Paul Briggs is allergic to enalapril maleate.   HPI  Today, he is being contacted for a post-procedure assessment.   Post-Procedure Evaluation  Procedure (11/10/2020):   Type: Diagnostic Glenohumeral  Joint (shoulder) Injection          Primary Purpose: Diagnostic Region: Superior Shoulder Area Level:  Shoulder Target Area: Glenohumeral Joint (shoulder) Approach: Anterior approach. Laterality: Left  Anxiolysis: Please see nurses note.  Effectiveness during initial hour after procedure (Ultra-Short Term Relief): 0 %   Local anesthetic used: Long-acting (4-6 hours) Effectiveness: Defined as any analgesic benefit obtained secondary to the administration of local anesthetics. This carries significant diagnostic value as to the etiological location, or anatomical origin, of the pain. Duration of benefit is expected to coincide with the duration of the local anesthetic used.  Effectiveness during initial 4-6 hours after procedure (Short-Term Relief): 0 %   Long-term benefit: Defined as any relief past the pharmacologic duration of the local anesthetics.  Effectiveness past the initial 6 hours after procedure (Long-Term Relief): 50 %   Benefits, current: Defined as benefit present at the time of this evaluation.   Analgesia:  50% however pain is coming back now and patient would like to repeat injection Function: Somewhat improved  Pharmacotherapy Assessment   Analgesic: Percocet 7.5 mg 3 times daily as needed, quantity 90/month   Monitoring: [place] PMP: PDMP reviewed during this encounter.       Pharmacotherapy: No side-effects or adverse reactions reported. Compliance: No problems identified. Effectiveness: Clinically acceptable. Plan: Refer to "POC". UDS:  Summary  Date Value Ref Range Status  10/28/2020 Note  Final    Comment:    ==================================================================== Compliance Drug Analysis, Ur ==================================================================== Test                             Result       Flag       Units  Drug Present not Declared for Prescription Verification   Oxycodone                      85           UNEXPECTED ng/mg  creat   Oxymorphone                    256          UNEXPECTED ng/mg creat   Noroxycodone                   318          UNEXPECTED ng/mg creat   Noroxymorphone                 151          UNEXPECTED ng/mg creat    Sources of oxycodone are scheduled prescription medications.    Oxymorphone, noroxycodone, and noroxymorphone are expected    metabolites of oxycodone. Oxymorphone is also available as a    scheduled prescription medication.    Acetaminophen                  PRESENT      UNEXPECTED   Ibuprofen                      PRESENT      UNEXPECTED  Drug Absent but Declared for Prescription Verification   Tramadol                       Not Detected UNEXPECTED ng/mg creat   Pregabalin                     Not Detected UNEXPECTED   Cyclobenzaprine                Not Detected UNEXPECTED   Salicylate  Not Detected UNEXPECTED    Salicylate, as indicated in the declared medication list, is not    always detected even when used as directed.     Aspirin, as indicated in the declared medication list, is not always    detected even when used as directed.  ==================================================================== Test                      Result    Flag   Units      Ref Range   Creatinine              72               mg/dL      >=20 ==================================================================== Declared Medications:  The flagging and interpretation on this report are based on the  following declared medications.  Unexpected results may arise from  inaccuracies in the declared medications.   **Note: The testing scope of this panel includes these medications:   Cyclobenzaprine (Flexeril)  Pregabalin (Lyrica)  Tramadol (Ultram)   **Note: The testing scope of this panel does not include small to  moderate amounts of these reported medications:   Aspirin  Salicylate (Salicylamide)   **Note: The testing scope of this panel does not include the   following reported medications:   Amlodipine (Norvasc)  Caffeine  Doxycycline  Hydrochlorothiazide (Zestoretic)  Levothyroxine (Synthroid)  Lisinopril (Zestoretic)  Metformin (Glucophage)  Omeprazole (Prilosec)  Rosuvastatin (Crestor)  Tadalafil (Cialis)  Tamsulosin (Flomax)  Topical ==================================================================== For clinical consultation, please call [phone]. ====================================================================      Laboratory Chemistry Profile   Renal Lab Results  Component Value Date   BUN 16 10/22/2015   CREATININE 1.25 (H) 10/22/2015   GFRAA >60 10/22/2015   GFRNONAA 57 (L) 10/22/2015    Hepatic Lab Results  Component Value Date   AST 47 (H) 10/22/2015   ALT 31 10/22/2015   ALBUMIN 3.8 10/22/2015   ALKPHOS 73 10/22/2015   LIPASE 357 02/25/2012    Electrolytes Lab Results  Component Value Date   NA 138 10/22/2015   K 3.3 (L) 10/22/2015   CL 107 10/22/2015   CALCIUM 8.8 (L) 10/22/2015    Bone Lab Results  Component Value Date   TESTOSTERONE 429 04/19/2017    Inflammation (CRP: Acute Phase) (ESR: Chronic Phase) Lab Results  Component Value Date   ESRSEDRATE 36 (H) 07/20/2015   LATICACIDVEN 1.6 07/20/2015         Note: Above Lab results reviewed.  Assessment  The primary encounter diagnosis was Primary osteoarthritis of left shoulder. Diagnoses of Chronic left shoulder pain and Left rotator cuff tear arthropathy were also pertinent to this visit.  Plan of Care    [name] has a current medication list which includes the following long-term medication(s): amlodipine, levothyroxine, lisinopril-hydrochlorothiazide, metformin, omeprazole, pregabalin, and rosuvastatin.  Repeat left shoulder steroid injection #2 without sedation.   Orders Placed This Encounter  Procedures   SHOULDER INJECTION    Standing Status:   Future    Standing Expiration Date:   02/07/2021     Scheduling Instructions:     Procedure: Intra-articular shoulder (Glenohumeral) joint injection     LEFT     Level: Glenohumeral joint               Without sedation    Order Specific Question:   Where will this procedure be performed?    Answer:   [facility]  Follow-up plan:   Return in 16 days (on 12/24/2020) for left shoulder injection #2 , without sedation.     Left shoulder injection 11/10/20   Recent Visits Date Type Provider Dept  11/10/20 Procedure visit Gillis Santa, MD [facility]  11/06/20 Office Visit Gillis Santa, MD [facility]  10/28/20 Office Visit Gillis Santa, MD [facility]  Showing recent visits within past 90 days and meeting all other requirements Today's Visits Date Type Provider Dept  12/08/20 Office Visit Gillis Santa, MD [facility]  Showing today's visits and meeting all other requirements Future Appointments Date Type Provider Dept  12/30/20 Appointment Gillis Santa, MD [facility]  Showing future appointments within next 90 days and meeting all other requirements I discussed the assessment and treatment plan with the patient. The patient was provided an opportunity to ask questions and all were answered. The patient agreed with the plan and demonstrated an understanding of the instructions.  Patient advised to call back or seek an in-person evaluation if the symptoms or condition worsens.  Duration of encounter: 67mnutes.  Note by: BGillis Santa MD Date: 12/08/2020; Time: 1:41 PM

## 2020-12-09 NOTE — Patient Instructions (Signed)

## 2020-12-11 ENCOUNTER — Other Ambulatory Visit: Payer: Self-pay | Admitting: *Deleted

## 2020-12-11 MED ORDER — LEVOTHYROXINE SODIUM 175 MCG PO TABS: 175.0000 ug | ORAL_TABLET | Freq: Every day | ORAL | 3 refills | Status: DC

## 2020-12-11 MED ORDER — LEVOTHYROXINE SODIUM 175 MCG PO TABS: 175.0000 ug | ORAL_TABLET | Freq: Every day | ORAL | 0 refills | Status: DC

## 2020-12-24 ENCOUNTER — Ambulatory Visit (HOSPITAL_BASED_OUTPATIENT_CLINIC_OR_DEPARTMENT_OTHER): Payer: Medicare PPO | Admitting: Student in an Organized Health Care Education/Training Program

## 2020-12-24 ENCOUNTER — Encounter: Payer: Self-pay | Admitting: Student in an Organized Health Care Education/Training Program

## 2020-12-24 ENCOUNTER — Other Ambulatory Visit: Payer: Self-pay

## 2020-12-24 ENCOUNTER — Ambulatory Visit
Admission: RE | Admit: 2020-12-24 | Discharge: 2020-12-24 | Disposition: A | Discharge status: Home / Self Care | Payer: Medicare PPO | Source: Ambulatory Visit | Attending: Student in an Organized Health Care Education/Training Program | Admitting: Student in an Organized Health Care Education/Training Program

## 2020-12-24 VITALS — BP 131/73 | HR 57 | Temp 97.0°F | Resp 13 | Ht 73.0 in | Wt 225.0 lb

## 2020-12-24 DIAGNOSIS — M19012 Primary osteoarthritis, left shoulder: Secondary | ICD-10-CM | POA: Insufficient documentation

## 2020-12-24 DIAGNOSIS — G8929 Other chronic pain: Secondary | ICD-10-CM | POA: Diagnosis not present

## 2020-12-24 DIAGNOSIS — M75102 Unspecified rotator cuff tear or rupture of left shoulder, not specified as traumatic: Secondary | ICD-10-CM | POA: Insufficient documentation

## 2020-12-24 DIAGNOSIS — M12812 Other specific arthropathies, not elsewhere classified, left shoulder: Secondary | ICD-10-CM

## 2020-12-24 DIAGNOSIS — M25512 Pain in left shoulder: Secondary | ICD-10-CM | POA: Diagnosis not present

## 2020-12-24 MED ORDER — METHYLPREDNISOLONE ACETATE 40 MG/ML IJ SUSP
40.0000 mg | Freq: Once | INTRAMUSCULAR | Status: AC
  Administered 2020-12-24: 40 mg via INTRA_ARTICULAR
  Filled 2020-12-24: qty 1

## 2020-12-24 MED ORDER — IOHEXOL 180 MG/ML  SOLN
10.0000 mL | Freq: Once | INTRAMUSCULAR | Status: AC
  Administered 2020-12-24: 5 mL via INTRA_ARTICULAR
  Filled 2020-12-24: qty 20

## 2020-12-24 MED ORDER — LIDOCAINE HCL 2 % IJ SOLN
20.0000 mL | Freq: Once | INTRAMUSCULAR | Status: AC
  Administered 2020-12-24: 100 mg
  Filled 2020-12-24: qty 20

## 2020-12-24 MED ORDER — ROPIVACAINE HCL 2 MG/ML IJ SOLN
4.0000 mL | Freq: Once | INTRAMUSCULAR | Status: AC
  Administered 2020-12-24: 20 mL via INTRA_ARTICULAR

## 2020-12-24 NOTE — Progress Notes (Signed)
Safety precautions to be maintained throughout the outpatient stay will include: orient to surroundings, keep bed in low position, maintain call bell within reach at all times, provide assistance with transfer out of bed and ambulation.  

## 2020-12-24 NOTE — Progress Notes (Signed)
PROVIDER NOTE: Information contained herein reflects review and annotations entered in association with encounter. Interpretation of such information and data should be left to medically-trained personnel. Information provided to patient can be located elsewhere in the medical record under "Patient Instructions". Document created using STT-dictation technology, any transcriptional errors that may result from process are unintentional.    Patient: Paul Ramus [name]  Service Category: Procedure  Provider: Gillis Santa, MD  DOB: [birth year]  DOS: 12/24/2020  Location: [facility]  MRN: [id number]  Setting: Ambulatory - outpatient  Referring Provider: Cletis Athens, MD  Type: Established Patient  Specialty: Interventional Pain Management  PCP: Cletis Athens, MD   Primary Reason for Visit: Interventional Pain Management Treatment. CC: Shoulder Pain (Left) and Back Pain (Lower, into hips bilat)    Procedure:          Anesthesia, Analgesia, Anxiolysis:  Type: Therapeutic Glenohumeral Joint (shoulder) Injection #2  Primary Purpose: Diagnostic Region: Superior Shoulder Area Level:  Shoulder Target Area: Glenohumeral Joint (shoulder) Approach: Anterior approach. Laterality: Left  Type: Local Anesthesia Local Anesthetic: Lidocaine 1-2%   Position: Supine   Indications: 1. Primary osteoarthritis of left shoulder   2. Chronic left shoulder pain   3. Left rotator cuff tear arthropathy     Pain Score: Pre-procedure: 4 /10 Post-procedure: 0-No pain/10     Pre-op H&P Assessment:  Paul Briggs is a 75 y.o. (year old), male patient, seen today for interventional treatment. He  has a past surgical history that includes Back surgery and head surgery. Paul Briggs has a current medication list which includes the following prescription(s): b-d single use swabs regular, amlodipine, aspirin-salicylamide-caffeine, true metrix level 1, true metrix meter, cyclobenzaprine, true metrix  pro blood glucose, lancets 33g, levothyroxine, lisinopril-hydrochlorothiazide, metformin, omeprazole, oxycodone-acetaminophen, pregabalin, rosuvastatin, and tamsulosin. His primarily concern today is the Shoulder Pain (Left) and Back Pain (Lower, into hips bilat)  Initial Vital Signs:  Pulse/HCG Rate:  (!) 57 ECG Heart Rate: 61 Temp: (!) 97 F (36.1 C) Resp: 16 BP: 137/67 SpO2: 100 %  BMI: Estimated body mass index is 29.69 kg/m as calculated from the following:   Height as of this encounter: 6\' 1"  (1.854 m).   Weight as of this encounter: 225 lb (102.1 kg).  Risk Assessment: Allergies: Reviewed. He is allergic to enalapril maleate.  Allergy Precautions: None required Coagulopathies: Reviewed. None identified.  Blood-thinner therapy: None at this time Active Infection(s): Reviewed. None identified. Paul Briggs is afebrile  Site Confirmation: Paul Briggs was asked to confirm the procedure and laterality before marking the site Procedure checklist: Completed Consent: Before the procedure and under the influence of no sedative(s), amnesic(s), or anxiolytics, the patient was informed of the treatment options, risks and possible complications. To fulfill our ethical and legal obligations, as recommended by the American Medical Association's Code of Ethics, I have informed the patient of my clinical impression; the nature and purpose of the treatment or procedure; the risks, benefits, and possible complications of the intervention; the alternatives, including doing nothing; the risk(s) and benefit(s) of the alternative treatment(s) or procedure(s); and the risk(s) and benefit(s) of doing nothing. The patient was provided information about the general risks and possible complications associated with the procedure. These may include, but are not limited to: failure to achieve desired goals, infection, bleeding, organ or nerve damage, allergic reactions, paralysis, and death. In addition, the  patient was informed of those risks and complications associated to the procedure, such as failure to decrease pain; infection; bleeding;  organ or nerve damage with subsequent damage to sensory, motor, and/or autonomic systems, resulting in permanent pain, numbness, and/or weakness of one or several areas of the body; allergic reactions; (i.e.: anaphylactic reaction); and/or death. Furthermore, the patient was informed of those risks and complications associated with the medications. These include, but are not limited to: allergic reactions (i.e.: anaphylactic or anaphylactoid reaction(s)); adrenal axis suppression; blood sugar elevation that in diabetics may result in ketoacidosis or comma; water retention that in patients with history of congestive heart failure may result in shortness of breath, pulmonary edema, and decompensation with resultant heart failure; weight gain; swelling or edema; medication-induced neural toxicity; particulate matter embolism and blood vessel occlusion with resultant organ, and/or nervous system infarction; and/or aseptic necrosis of one or more joints. Finally, the patient was informed that Medicine is not an exact science; therefore, there is also the possibility of unforeseen or unpredictable risks and/or possible complications that may result in a catastrophic outcome. The patient indicated having understood very clearly. We have given the patient no guarantees and we have made no promises. Enough time was given to the patient to ask questions, all of which were answered to the patient's satisfaction. Paul Briggs has indicated that he wanted to continue with the procedure. Attestation: I, the ordering provider, attest that I have discussed with the patient the benefits, risks, side-effects, alternatives, likelihood of achieving goals, and potential problems during recovery for the procedure that I have provided informed consent. Date  Time: 12/24/2020 11:22  AM  Pre-Procedure Preparation:  Monitoring: As per clinic protocol. Respiration, ETCO2, SpO2, BP, heart rate and rhythm monitor placed and checked for adequate function Safety Precautions: Patient was assessed for positional comfort and pressure points before starting the procedure. Time-out: I initiated and conducted the "Time-out" before starting the procedure, as per protocol. The patient was asked to participate by confirming the accuracy of the "Time Out" information. Verification of the correct person, site, and procedure were performed and confirmed by me, the nursing staff, and the patient. "Time-out" conducted as per Joint Commission's Universal Protocol (UP.01.01.01). Time: 1156  Description of Procedure:          Area Prepped: Entire shoulder Area DuraPrep (Iodine Povacrylex [0.7% available iodine] and Isopropyl Alcohol, 74% w/w) Safety Precautions: Aspiration looking for blood return was conducted prior to all injections. At no point did we inject any substances, as a needle was being advanced. No attempts were made at seeking any paresthesias. Safe injection practices and needle disposal techniques used. Medications properly checked for expiration dates. SDV (single dose vial) medications used. Description of the Procedure: Protocol guidelines were followed. The patient was placed in position over the procedure table. The target area was identified and the area prepped in the usual manner. Skin & deeper tissues infiltrated with local anesthetic. Appropriate amount of time allowed to pass for local anesthetics to take effect. The procedure needles were then advanced to the target area. Proper needle placement secured. Negative aspiration confirmed. Solution injected in intermittent fashion, asking for systemic symptoms every 0.5cc of injectate. The needles were then removed and the area cleansed, making sure to leave some of the prepping solution back to take advantage of its long term  bactericidal properties.         Vitals:   12/24/20 1127 12/24/20 1201  BP: 137/67 131/73  Pulse: (!) 57   Resp: 16 13  Temp: (!) 97 F (36.1 C)   TempSrc: Temporal   SpO2: 100% 100%  Weight:  225 lb (102.1 kg)   Height: 6\' 1"  (1.854 m)     Start Time: 1156 hrs. End Time: 1200 hrs. Materials:  Needle(s) Type: Spinal Needle Gauge: 25G Length: 3.5-in Medication(s): Please see orders for medications and dosing details. 5cc solution made of 4cc of  0.2% ropivacaine, 1 cc of methylprednisolone, 40 mg/cc  Imaging Guidance (Non-Spinal):          Type of Imaging Technique: Fluoroscopy Guidance (Non-Spinal) Indication(s): Assistance in needle guidance and placement for procedures requiring needle placement in or near specific anatomical locations not easily accessible without such assistance. Exposure Time: Please see nurses notes. Contrast: Before injecting any contrast, we confirmed that the patient did not have an allergy to iodine, shellfish, or radiological contrast. Once satisfactory needle placement was completed at the desired level, radiological contrast was injected. Contrast injected under live fluoroscopy. No contrast complications. See chart for type and volume of contrast used. Fluoroscopic Guidance: I was personally present during the use of fluoroscopy. "Tunnel Vision Technique" used to obtain the best possible view of the target area. Parallax error corrected before commencing the procedure. "Direction-depth-direction" technique used to introduce the needle under continuous pulsed fluoroscopy. Once target was reached, antero-posterior, oblique, and lateral fluoroscopic projection used confirm needle placement in all planes. Images permanently stored in EMR. Interpretation: I personally interpreted the imaging intraoperatively. Adequate needle placement confirmed in multiple planes. Appropriate spread of contrast into desired area was observed. No evidence of afferent or efferent  intravascular uptake. Permanent images saved into the patient's record.  Post-operative Assessment:  Post-procedure Vital Signs:  Pulse/HCG Rate:  (!) 57 61 Temp:  (!) 97 F (36.1 C) Resp: 13 BP:  131/73 SpO2: 100 %  EBL: None  Complications: No immediate post-treatment complications observed by team, or reported by patient.  Note: The patient tolerated the entire procedure well. A repeat set of vitals were taken after the procedure and the patient was kept under observation following institutional policy, for this type of procedure. Post-procedural neurological assessment was performed, showing return to baseline, prior to discharge. The patient was provided with post-procedure discharge instructions, including a section on how to identify potential problems. Should any problems arise concerning this procedure, the patient was given instructions to immediately contact us, at any time, without hesitation. In any case, we plan to contact the patient by telephone for a follow-up status report regarding this interventional procedure.  Comments:  No additional relevant information.  Plan of Care  Orders:  Orders Placed This Encounter  Procedures   DG PAIN [facility] C-ARM 1-60 MIN NO REPORT    Intraoperative interpretation by procedural physician at [place].    Standing Status:   Standing    Number of Occurrences:   1    Order Specific Question:   Reason for exam:    Answer:   Assistance in needle guidance and placement for procedures requiring needle placement in or near specific anatomical locations not easily accessible without such assistance.    Chronic Opioid Analgesic:  Percocet 7.5 mg 3 times daily as needed, quantity 90/month   Medications ordered for procedure: Meds ordered this encounter  Medications   iohexol (OMNIPAQUE) 180 MG/ML injection 10 mL    Must be Myelogram-compatible. If not available, you may substitute with a water-soluble, non-ionic,  hypoallergenic, myelogram-compatible radiological contrast medium.   lidocaine (XYLOCAINE) 2 % (with pres) injection 400 mg   ropivacaine (PF) 2 mg/mL (0.2%) (NAROPIN) injection 4 mL   methylPREDNISolone acetate (DEPO-MEDROL) injection 40  mg    Medications administered: We administered iohexol, lidocaine, ropivacaine (PF) 2 mg/mL (0.2%), and methylPREDNISolone acetate.  See the medical record for exact dosing, route, and time of administration.  Follow-up plan:   Return for Keep sch. appt.       Left shoulder injection 11/10/20, 12/24/20  Recent Visits Date Type Provider Dept  12/08/20 Office Visit Gillis Santa, MD [facility]  11/10/20 Procedure visit Gillis Santa, MD [facility]  11/06/20 Office Visit Gillis Santa, MD [facility]  10/28/20 Office Visit Gillis Santa, MD [facility]  Showing recent visits within past 90 days and meeting all other requirements Today's Visits Date Type Provider Dept  12/24/20 Procedure visit Gillis Santa, MD [facility]  Showing today's visits and meeting all other requirements Future Appointments Date Type Provider Dept  12/30/20 Appointment Gillis Santa, MD [facility]  Showing future appointments within next 90 days and meeting all other requirements Disposition: Discharge home  Discharge (Date  Time): 12/24/2020; 1210 hrs.   Primary Care Physician: Cletis Athens, MD Location: [facility] Note by: Gillis Santa, MD Date: 12/24/2020; Time: 2:38 PM  Disclaimer:  Medicine is not an exact science. The only guarantee in medicine is that nothing is guaranteed. It is important to note that the decision to proceed with this intervention was based on the information collected from the patient. The Data and conclusions were drawn from the patient's questionnaire, the interview, and the physical examination. Because the information was provided in large part by  the patient, it cannot be guaranteed that it has not been purposely or unconsciously manipulated. Every effort has been made to obtain as much relevant data as possible for this evaluation. It is important to note that the conclusions that lead to this procedure are derived in large part from the available data. Always take into account that the treatment will also be dependent on availability of resources and existing treatment guidelines, considered by other Pain Management Practitioners as being common knowledge and practice, at the time of the intervention. For Medico-Legal purposes, it is also important to point out that variation in procedural techniques and pharmacological choices are the acceptable norm. The indications, contraindications, technique, and results of the above procedure should only be interpreted and judged by a Board-Certified Interventional Pain Specialist with extensive familiarity and expertise in the same exact procedure and technique.

## 2020-12-24 NOTE — Patient Instructions (Signed)

## 2020-12-25 ENCOUNTER — Telehealth: Payer: Self-pay

## 2020-12-25 NOTE — Telephone Encounter (Signed)
Called PP. Patient outside working. Denies any needs at this time.

## 2020-12-29 ENCOUNTER — Ambulatory Visit: Payer: Medicare PPO

## 2020-12-29 ENCOUNTER — Other Ambulatory Visit: Payer: Self-pay

## 2020-12-29 NOTE — Progress Notes (Unsigned)
Pt did not show for scheduled appointment.  

## 2020-12-30 ENCOUNTER — Encounter: Payer: Medicare PPO | Admitting: Student in an Organized Health Care Education/Training Program

## 2021-01-08 ENCOUNTER — Other Ambulatory Visit: Payer: Self-pay

## 2021-01-08 ENCOUNTER — Ambulatory Visit
Payer: Medicare PPO | Attending: Student in an Organized Health Care Education/Training Program | Admitting: Student in an Organized Health Care Education/Training Program

## 2021-01-08 ENCOUNTER — Encounter: Payer: Self-pay | Admitting: Student in an Organized Health Care Education/Training Program

## 2021-01-08 VITALS — BP 165/83 | HR 59 | Temp 97.2°F | Resp 16 | Ht 73.0 in | Wt 225.0 lb

## 2021-01-08 DIAGNOSIS — G8929 Other chronic pain: Secondary | ICD-10-CM | POA: Diagnosis not present

## 2021-01-08 DIAGNOSIS — M12812 Other specific arthropathies, not elsewhere classified, left shoulder: Secondary | ICD-10-CM | POA: Diagnosis not present

## 2021-01-08 DIAGNOSIS — Z0289 Encounter for other administrative examinations: Secondary | ICD-10-CM | POA: Insufficient documentation

## 2021-01-08 DIAGNOSIS — G894 Chronic pain syndrome: Secondary | ICD-10-CM

## 2021-01-08 DIAGNOSIS — M5416 Radiculopathy, lumbar region: Secondary | ICD-10-CM

## 2021-01-08 DIAGNOSIS — M25512 Pain in left shoulder: Secondary | ICD-10-CM | POA: Insufficient documentation

## 2021-01-08 DIAGNOSIS — M48062 Spinal stenosis, lumbar region with neurogenic claudication: Secondary | ICD-10-CM | POA: Insufficient documentation

## 2021-01-08 DIAGNOSIS — M75102 Unspecified rotator cuff tear or rupture of left shoulder, not specified as traumatic: Secondary | ICD-10-CM | POA: Diagnosis not present

## 2021-01-08 DIAGNOSIS — M19012 Primary osteoarthritis, left shoulder: Secondary | ICD-10-CM | POA: Diagnosis not present

## 2021-01-08 MED ORDER — OXYCODONE-ACETAMINOPHEN 7.5-325 MG PO TABS: 1.0000 | ORAL_TABLET | Freq: Three times a day (TID) | ORAL | 0 refills | Status: DC | PRN

## 2021-01-08 MED ORDER — PREGABALIN 100 MG PO CAPS: 100.0000 mg | ORAL_CAPSULE | Freq: Two times a day (BID) | ORAL | 2 refills | Status: DC

## 2021-01-08 NOTE — Progress Notes (Signed)
PROVIDER NOTE: Information contained herein reflects review and annotations entered in association with encounter. Interpretation of such information and data should be left to medically-trained personnel. Information provided to patient can be located elsewhere in the medical record under "Patient Instructions". Document created using STT-dictation technology, any transcriptional errors that may result from process are unintentional.    Patient: Paul Ramus [name]  Service Category: E/M  Provider: Gillis Santa, MD  DOB: [birth year]  DOS: 01/08/2021  Specialty: Interventional Pain Management  MRN: [id number]  Setting: Ambulatory outpatient  PCP: Cletis Athens, MD  Type: Established Patient    Referring Provider: Cletis Athens, MD  Location: Office  Delivery: Face-to-face     HPI  [name], a 75 y.o. 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 Mr. Hanken primary complain today is Shoulder Pain and Back Pain (lower) Last encounter: My last encounter with him was on 12/24/20 Pertinent problems: Mr. Mayabb has Chronic back pain; Disorder of rotator cuff, left; Primary osteoarthritis of left shoulder; Left rotator cuff tear arthropathy; Spinal stenosis, lumbar region, with neurogenic claudication; Chronic left shoulder pain; Chronic pain syndrome; and Pain management contract signed on their pertinent problem list. Pain Assessment: Severity of Chronic pain is reported as a 6 /10. Location: Shoulder Left/left shoulder. Onset: More than a month ago. Quality: Aching, Burning, Throbbing, Discomfort, Constant. Timing: Constant. Modifying factor(s): medication. Vitals:  height is 6' 1"  (1.854 m) and weight is 225 lb (102.1 kg). His temperature is 97.2 F (36.2 C) (abnormal). His blood pressure is 165/83 (abnormal) and his pulse is 59 (abnormal). His respiration is 16 and oxygen saturation is 100%.   Reason for encounter: both,  medication management and post-procedure evaluation and assessment   Follow-up Post-Procedure Evaluation Interpretation and Assessment Section     Procedure:          Anesthesia, Analgesia, Anxiolysis:  Type: Therapeutic Glenohumeral Joint (shoulder) Injection #2  Primary Purpose: Diagnostic Region: Superior Shoulder Area Level:  Shoulder Target Area: Glenohumeral Joint (shoulder) Approach: Anterior approach. Laterality: Left  Type: Local Anesthesia Local Anesthetic: Lidocaine 1-2%   Position: Supine   Indications: 1. Primary osteoarthritis of left shoulder   2. Chronic left shoulder pain   3. Left rotator cuff tear arthropathy     Pain Score: Pre-procedure: 4 /10 Post-procedure: 0-No pain/10      Effectiveness:  Initial hour after procedure: 80 %  Subsequent 4-6 hours after procedure: 80 %  Analgesia past initial 6 hours: 50 % (current)  Ongoing Benefits:  Analgesic:  50% Function: Mr. Shimabukuro reports improvement in function ROM: Mr. Dorner reports improvement in ROM   Pharmacotherapy Assessment  Analgesic: Percocet 7.5 mg 3 times daily as needed, quantity 90/month   Monitoring: [place] PMP: PDMP reviewed during this encounter.       Pharmacotherapy: No side-effects or adverse reactions reported. Compliance: No problems identified. Effectiveness: Clinically acceptable.  UDS:  Summary  Date Value Ref Range Status  10/28/2020 Note  Final    Comment:    ==================================================================== Compliance Drug Analysis, Ur ==================================================================== Test                             Result       Flag       Units  Drug Present not Declared for Prescription Verification   Oxycodone  85           UNEXPECTED ng/mg creat   Oxymorphone                    256          UNEXPECTED ng/mg creat   Noroxycodone                   318          UNEXPECTED ng/mg creat   Noroxymorphone                  151          UNEXPECTED ng/mg creat    Sources of oxycodone are scheduled prescription medications.    Oxymorphone, noroxycodone, and noroxymorphone are expected    metabolites of oxycodone. Oxymorphone is also available as a    scheduled prescription medication.    Acetaminophen                  PRESENT      UNEXPECTED   Ibuprofen                      PRESENT      UNEXPECTED  Drug Absent but Declared for Prescription Verification   Tramadol                       Not Detected UNEXPECTED ng/mg creat   Pregabalin                     Not Detected UNEXPECTED   Cyclobenzaprine                Not Detected UNEXPECTED   Salicylate                     Not Detected UNEXPECTED    Salicylate, as indicated in the declared medication list, is not    always detected even when used as directed.     Aspirin, as indicated in the declared medication list, is not always    detected even when used as directed.  ==================================================================== Test                      Result    Flag   Units      Ref Range   Creatinine              72               mg/dL      >=20 ==================================================================== Declared Medications:  The flagging and interpretation on this report are based on the  following declared medications.  Unexpected results may arise from  inaccuracies in the declared medications.   **Note: The testing scope of this panel includes these medications:   Cyclobenzaprine (Flexeril)  Pregabalin (Lyrica)  Tramadol (Ultram)   **Note: The testing scope of this panel does not include small to  moderate amounts of these reported medications:   Aspirin  Salicylate (Salicylamide)   **Note: The testing scope of this panel does not include the  following reported medications:   Amlodipine (Norvasc)  Caffeine  Doxycycline  Hydrochlorothiazide (Zestoretic)  Levothyroxine (Synthroid)  Lisinopril (Zestoretic)   Metformin (Glucophage)  Omeprazole (Prilosec)  Rosuvastatin (Crestor)  Tadalafil (Cialis)  Tamsulosin (Flomax)  Topical ==================================================================== For clinical consultation, please call [phone]. ====================================================================       Pharmacotherapy Assessment  Analgesic: Percocet 7.5  mg 3 times daily as needed, quantity 90/month   Monitoring: [place] PMP: PDMP reviewed during this encounter.       Pharmacotherapy: No side-effects or adverse reactions reported. Compliance: No problems identified. Effectiveness: Clinically acceptable.  Ignatius Specking, RN  01/08/2021 10:17 AM  Sign when Signing Visit Nursing Pain Medication Assessment:  Safety precautions to be maintained throughout the outpatient stay will include: orient to surroundings, keep bed in low position, maintain call bell within reach at all times, provide assistance with transfer out of bed and ambulation.  Medication Inspection Compliance: Pill count conducted under aseptic conditions, in front of the patient. Neither the pills nor the bottle was removed from the patient's sight at any time. Once count was completed pills were immediately returned to the patient in their original bottle.  Medication: Oxycodone IR Pill/Patch Count:  3 of 90 pills remain Pill/Patch Appearance:  Label hard to read Bottle Appearance: Standard pharmacy container. Clearly labeled. Filled Date: 0 / 0 / 2022 Last Medication intake:  Today  Unable to read prescription bottle , writing worn off      UDS:  Summary  Date Value Ref Range Status  10/28/2020 Note  Final    Comment:    ==================================================================== Compliance Drug Analysis, Ur ==================================================================== Test                             Result       Flag       Units  Drug Present not Declared for Prescription  Verification   Oxycodone                      85           UNEXPECTED ng/mg creat   Oxymorphone                    256          UNEXPECTED ng/mg creat   Noroxycodone                   318          UNEXPECTED ng/mg creat   Noroxymorphone                 151          UNEXPECTED ng/mg creat    Sources of oxycodone are scheduled prescription medications.    Oxymorphone, noroxycodone, and noroxymorphone are expected    metabolites of oxycodone. Oxymorphone is also available as a    scheduled prescription medication.    Acetaminophen                  PRESENT      UNEXPECTED   Ibuprofen                      PRESENT      UNEXPECTED  Drug Absent but Declared for Prescription Verification   Tramadol                       Not Detected UNEXPECTED ng/mg creat   Pregabalin                     Not Detected UNEXPECTED   Cyclobenzaprine                Not Detected UNEXPECTED   Salicylate  Not Detected UNEXPECTED    Salicylate, as indicated in the declared medication list, is not    always detected even when used as directed.     Aspirin, as indicated in the declared medication list, is not always    detected even when used as directed.  ==================================================================== Test                      Result    Flag   Units      Ref Range   Creatinine              72               mg/dL      >=20 ==================================================================== Declared Medications:  The flagging and interpretation on this report are based on the  following declared medications.  Unexpected results may arise from  inaccuracies in the declared medications.   **Note: The testing scope of this panel includes these medications:   Cyclobenzaprine (Flexeril)  Pregabalin (Lyrica)  Tramadol (Ultram)   **Note: The testing scope of this panel does not include small to  moderate amounts of these reported medications:   Aspirin  Salicylate  (Salicylamide)   **Note: The testing scope of this panel does not include the  following reported medications:   Amlodipine (Norvasc)  Caffeine  Doxycycline  Hydrochlorothiazide (Zestoretic)  Levothyroxine (Synthroid)  Lisinopril (Zestoretic)  Metformin (Glucophage)  Omeprazole (Prilosec)  Rosuvastatin (Crestor)  Tadalafil (Cialis)  Tamsulosin (Flomax)  Topical ==================================================================== For clinical consultation, please call [phone]. ====================================================================      ROS  Constitutional: Denies any fever or chills Gastrointestinal: No reported hemesis, hematochezia, vomiting, or acute GI distress Musculoskeletal: Denies any acute onset joint swelling, redness, loss of ROM, or weakness Neurological: No reported episodes of acute onset apraxia, aphasia, dysarthria, agnosia, amnesia, paralysis, loss of coordination, or loss of consciousness  Medication Review  Aspirin-Salicylamide-Caffeine, B-D SINGLE USE SWABS REGULAR, Lancets 33G, True Metrix Level 1, True Metrix Meter, amLODipine, cyclobenzaprine, glucose blood, levothyroxine, lisinopril-hydrochlorothiazide, metFORMIN, omeprazole, oxyCODONE-acetaminophen, pregabalin, rosuvastatin, and tamsulosin  History Review  Allergy: Mr. Krider is allergic to enalapril maleate. Drug: Mr. Ferencz  reports no history of drug use. Alcohol:  reports no history of alcohol use. Tobacco:  reports that he has quit smoking. He has never used smokeless tobacco. Social: Mr. Laneve  reports that he has quit smoking. He has never used smokeless tobacco. He reports that he does not drink alcohol and does not use drugs. Medical:  has a past medical history of Acute prostatitis, BPH (benign prostatic hyperplasia), Chronic kidney disease, Dysplasia of prostate, Erectile dysfunction, HTN (hypertension), Hypogonadism in male, Orchitis and epididymitis, Over  weight, Palindromic rheumatism, hand, Prostatitis, Rectum pain, Testicle swelling, Testicle tenderness, and Testicular mass. Surgical: Mr. Wearing  has a past surgical history that includes Back surgery and head surgery. Family: family history includes Cancer in his maternal aunt.  Laboratory Chemistry Profile   Renal Lab Results  Component Value Date   BUN 16 10/22/2015   CREATININE 1.25 (H) 10/22/2015   GFRAA >60 10/22/2015   GFRNONAA 57 (L) 10/22/2015    Hepatic Lab Results  Component Value Date   AST 47 (H) 10/22/2015   ALT 31 10/22/2015   ALBUMIN 3.8 10/22/2015   ALKPHOS 73 10/22/2015   LIPASE 357 02/25/2012    Electrolytes Lab Results  Component Value Date   NA 138 10/22/2015   K 3.3 (L) 10/22/2015   CL 107 10/22/2015   CALCIUM  8.8 (L) 10/22/2015    Bone Lab Results  Component Value Date   TESTOSTERONE 429 04/19/2017    Inflammation (CRP: Acute Phase) (ESR: Chronic Phase) Lab Results  Component Value Date   ESRSEDRATE 36 (H) 07/20/2015   LATICACIDVEN 1.6 07/20/2015         Note: Above Lab results reviewed.  Recent Imaging Review  DG PAIN CLINIC C-ARM 1-60 MIN NO REPORT Fluoro was used, but no Radiologist interpretation will be provided.  Please refer to "NOTES" tab for provider progress note. Note: Reviewed        Physical Exam  General appearance: Well nourished, well developed, and well hydrated. In no apparent acute distress Mental status: Alert, oriented x 3 (person, place, & time)       Respiratory: No evidence of acute respiratory distress Eyes: PERLA Vitals: BP (!) 165/83    Pulse (!) 59    Temp (!) 97.2 F (36.2 C)    Resp 16    Ht 6' 1"  (1.854 m)    Wt 225 lb (102.1 kg)    SpO2 100%    BMI 29.69 kg/m  BMI: Estimated body mass index is 29.69 kg/m as calculated from the following:   Height as of this encounter: 6' 1"  (1.854 m).   Weight as of this encounter: 225 lb (102.1 kg). Ideal: Ideal body weight: 79.9 kg (176 lb 2.4 oz) Adjusted  ideal body weight: 88.8 kg (195 lb 11 oz)  Assessment   Status Diagnosis  Improved Persistent Improved 1. Primary osteoarthritis of left shoulder   2. Spinal stenosis, lumbar region, with neurogenic claudication   3. Chronic left shoulder pain   4. Left rotator cuff tear arthropathy   5. Pain management contract signed   6. Chronic radicular lumbar pain   7. Lumbar radicular pain   8. Chronic pain syndrome      Updated Problems: Problem  Primary Osteoarthritis of Left Shoulder  Left Rotator Cuff Tear Arthropathy  Spinal Stenosis, Lumbar Region, With Neurogenic Claudication  Chronic Left Shoulder Pain  Chronic Pain Syndrome  Pain Management Contract Signed  Disorder of Rotator Cuff, Left  Chronic Back Pain    Plan of Care   [name] has a current medication list which includes the following long-term medication(s): amlodipine, levothyroxine, lisinopril-hydrochlorothiazide, metformin, omeprazole, rosuvastatin, and pregabalin.  Pharmacotherapy (Medications Ordered): Meds ordered this encounter  Medications   pregabalin (LYRICA) 100 MG capsule    Sig: Take 1 capsule (100 mg total) by mouth 2 (two) times daily.    Dispense:  60 capsule    Refill:  2   oxyCODONE-acetaminophen (PERCOCET) 7.5-325 MG tablet    Sig: Take 1 tablet by mouth every 8 (eight) hours as needed for severe pain.    Dispense:  90 tablet    Refill:  0   oxyCODONE-acetaminophen (PERCOCET) 7.5-325 MG tablet    Sig: Take 1 tablet by mouth every 8 (eight) hours as needed for severe pain.    Dispense:  90 tablet    Refill:  0   oxyCODONE-acetaminophen (PERCOCET) 7.5-325 MG tablet    Sig: Take 1 tablet by mouth every 8 (eight) hours as needed for severe pain.    Dispense:  90 tablet    Refill:  0    Follow-up plan:   Return in about 3 months (around 04/08/2021) for Medication Management, in person.     Left shoulder injection 11/10/20, 12/24/20   Recent Visits Date Type Provider  Dept  12/24/20  Procedure visit Gillis Santa, MD [facility]  12/08/20 Office Visit Gillis Santa, MD [facility]  11/10/20 Procedure visit Gillis Santa, MD [facility]  11/06/20 Office Visit Gillis Santa, MD [facility]  10/28/20 Office Visit Gillis Santa, MD [facility]  Showing recent visits within past 90 days and meeting all other requirements Today's Visits Date Type Provider Dept  01/08/21 Office Visit Gillis Santa, MD [facility]  Showing today's visits and meeting all other requirements Future Appointments No visits were found meeting these conditions. Showing future appointments within next 90 days and meeting all other requirements I discussed the assessment and treatment plan with the patient. The patient was provided an opportunity to ask questions and all were answered. The patient agreed with the plan and demonstrated an understanding of the instructions.  Patient advised to call back or seek an in-person evaluation if the symptoms or condition worsens.  Duration of encounter: 40mnutes.  Note by: BGillis Santa MD Date: 01/08/2021; Time: 10:31 AM

## 2021-01-08 NOTE — Progress Notes (Signed)
Nursing Pain Medication Assessment:  Safety precautions to be maintained throughout the outpatient stay will include: orient to surroundings, keep bed in low position, maintain call bell within reach at all times, provide assistance with transfer out of bed and ambulation.  Medication Inspection Compliance: Pill count conducted under aseptic conditions, in front of the patient. Neither the pills nor the bottle was removed from the patient's sight at any time. Once count was completed pills were immediately returned to the patient in their original bottle.  Medication: Oxycodone IR Pill/Patch Count:  3 of 90 pills remain Pill/Patch Appearance:  Label hard to read Bottle Appearance: Standard pharmacy container. Clearly labeled. Filled Date: 0 / 0 / 2022 Last Medication intake:  Today  Unable to read prescription bottle , writing worn off

## 2021-03-09 ENCOUNTER — Ambulatory Visit: Payer: No Typology Code available for payment source | Admitting: Internal Medicine

## 2021-03-09 ENCOUNTER — Other Ambulatory Visit: Payer: Self-pay

## 2021-03-09 NOTE — Progress Notes (Signed)
PT canceled his appointment.  

## 2021-03-10 ENCOUNTER — Other Ambulatory Visit: Payer: Self-pay

## 2021-03-10 ENCOUNTER — Encounter: Payer: Self-pay | Admitting: Internal Medicine

## 2021-03-10 ENCOUNTER — Ambulatory Visit: Payer: Medicare PPO | Admitting: Internal Medicine

## 2021-03-10 VITALS — BP 140/82 | HR 80 | Ht 73.0 in | Wt 233.6 lb

## 2021-03-10 DIAGNOSIS — E039 Hypothyroidism, unspecified: Secondary | ICD-10-CM | POA: Diagnosis not present

## 2021-03-10 DIAGNOSIS — G4734 Idiopathic sleep related nonobstructive alveolar hypoventilation: Secondary | ICD-10-CM | POA: Diagnosis not present

## 2021-03-10 DIAGNOSIS — I10 Essential (primary) hypertension: Secondary | ICD-10-CM

## 2021-03-10 DIAGNOSIS — G894 Chronic pain syndrome: Secondary | ICD-10-CM

## 2021-03-10 DIAGNOSIS — E119 Type 2 diabetes mellitus without complications: Secondary | ICD-10-CM

## 2021-03-10 LAB — GLUCOSE, POCT (MANUAL RESULT ENTRY): POC Glucose: 130 mg/dl — AB (ref 70–99)

## 2021-03-10 LAB — POCT GLYCOSYLATED HEMOGLOBIN (HGB A1C): HbA1c POC (<> result, manual entry): 6.8 % (ref 4.0–5.6)

## 2021-03-10 NOTE — Assessment & Plan Note (Signed)

## 2021-03-10 NOTE — Assessment & Plan Note (Signed)
Patient has a pain contract

## 2021-03-10 NOTE — Assessment & Plan Note (Signed)
Stable at the present time. 

## 2021-03-10 NOTE — Progress Notes (Signed)
Established Patient Office Visit  Subjective:  Patient ID: Paul Zeoli [name], male    DOB: 1945-07-24  Age: 76 y.o. MRN: [id number]  CC:  Chief Complaint  Patient presents with   Diabetes    Diabetes   Paul Ramus [name] presents forgeneral check  Past Medical History:  Diagnosis Date   Acute prostatitis    BPH (benign prostatic hyperplasia)    Chronic kidney disease    Dysplasia of prostate    Erectile dysfunction    HTN (hypertension)    Hypogonadism in male    Orchitis and epididymitis    Over weight    Palindromic rheumatism, hand    Prostatitis    Rectum pain    Testicle swelling    Testicle tenderness    Testicular mass     Past Surgical History:  Procedure Laterality Date   BACK SURGERY     head surgery     from fall  drilled hole in brain to relieve pressure    Family History  Problem Relation Age of Onset   Cancer Maternal Aunt    Prostate cancer Neg Hx    Kidney disease Neg Hx    Kidney cancer Neg Hx    Bladder Cancer Neg Hx     Social History   Socioeconomic History   Marital status: Legally Separated    Spouse name: Not on file   Number of children: Not on file   Years of education: Not on file   Highest education level: Not on file  Occupational History   Not on file  Tobacco Use   Smoking status: Former   Smokeless tobacco: Never   Tobacco comments:    quit 30 years ago  Vaping Use   Vaping Use: Never used  Substance and Sexual Activity   Alcohol use: No    Alcohol/week: 0.0 standard drinks   Drug use: No   Sexual activity: Not on file  Other Topics Concern   Not on file  Social History Narrative   Not on file   Social Determinants of Health   Financial Resource Strain: Not on file  Food Insecurity: Not on file  Transportation Needs: Not on file  Physical Activity: Not on file  Stress: Not on file  Social Connections: Not on file  Intimate Partner Violence: Not on file     Current Outpatient  Medications:    Alcohol Swabs (B-D SINGLE USE SWABS REGULAR) PADS, 100 each by Does not apply route daily., Disp: 100 each, Rfl: 6   amLODipine (NORVASC) 10 MG tablet, Take 1 tablet (10 mg total) by mouth daily., Disp: 90 tablet, Rfl: 0   Aspirin-Salicylamide-Caffeine (ARTHRITIS STRENGTH BC POWDER PO), Take by mouth., Disp: , Rfl:    Blood Glucose Calibration (TRUE METRIX LEVEL 1) Low SOLN, 1 Bottle by In Vitro route daily., Disp: 1 each, Rfl: 6   Blood Glucose Monitoring Suppl (TRUE METRIX METER) DEVI, Check blood sugar daily, Disp: 1 each, Rfl: 3   cyclobenzaprine (FLEXERIL) 5 MG tablet, Take 1 tablet (5 mg total) by mouth 3 (three) times daily as needed for muscle spasms., Disp: 30 tablet, Rfl: 1   glucose blood (TRUE METRIX PRO BLOOD GLUCOSE) test strip, Use as instructed, Disp: 300 each, Rfl: 3   Lancets 33G MISC, Check blood sugar daily, Disp: 300 each, Rfl: 3   levothyroxine (SYNTHROID) 175 MCG tablet, Take 1 tablet (175 mcg total) by mouth daily., Disp: 30 tablet, Rfl: 0   lisinopril-hydrochlorothiazide (ZESTORETIC)  20-12.5 MG tablet, TAKE 1 TABLET EVERY DAY, Disp: 90 tablet, Rfl: 3   metFORMIN (GLUCOPHAGE) 500 MG tablet, Take 1 tablet (500 mg total) by mouth 2 (two) times daily with a meal., Disp: 180 tablet, Rfl: 3   omeprazole (PRILOSEC) 20 MG capsule, TAKE 1 CAPSULE EVERY DAY, Disp: 90 capsule, Rfl: 3   oxyCODONE-acetaminophen (PERCOCET) 7.5-325 MG tablet, Take 1 tablet by mouth every 8 (eight) hours as needed for severe pain., Disp: 90 tablet, Rfl: 0   [START ON 03/12/2021] oxyCODONE-acetaminophen (PERCOCET) 7.5-325 MG tablet, Take 1 tablet by mouth every 8 (eight) hours as needed for severe pain., Disp: 90 tablet, Rfl: 0   pregabalin (LYRICA) 100 MG capsule, Take 1 capsule (100 mg total) by mouth 2 (two) times daily., Disp: 60 capsule, Rfl: 2   rosuvastatin (CRESTOR) 10 MG tablet, Take 1 tablet (10 mg total) by mouth daily., Disp: 90 tablet, Rfl: 3   tamsulosin (FLOMAX) 0.4 MG CAPS  capsule, Take 1 capsule (0.4 mg total) by mouth daily., Disp: 90 capsule, Rfl: 3   Allergies  Allergen Reactions   Enalapril Maleate Other (See Comments)    ROS Review of Systems  Constitutional: Negative.   HENT: Negative.    Eyes: Negative.   Respiratory: Negative.    Cardiovascular: Negative.   Gastrointestinal: Negative.   Endocrine: Negative.   Genitourinary: Negative.   Musculoskeletal: Negative.   Skin: Negative.   Allergic/Immunologic: Negative.   Neurological: Negative.   Hematological: Negative.   Psychiatric/Behavioral: Negative.    All other systems reviewed and are negative.    Objective:    Physical Exam Vitals reviewed.  Constitutional:      Appearance: Normal appearance.  HENT:     Mouth/Throat:     Mouth: Mucous membranes are moist.  Eyes:     Pupils: Pupils are equal, round, and reactive to light.  Neck:     Vascular: No carotid bruit.  Cardiovascular:     Rate and Rhythm: Normal rate and regular rhythm.     Pulses: Normal pulses.     Heart sounds: Normal heart sounds.  Pulmonary:     Effort: Pulmonary effort is normal.     Breath sounds: Normal breath sounds.  Abdominal:     General: Bowel sounds are normal.     Palpations: Abdomen is soft. There is no hepatomegaly, splenomegaly or mass.     Tenderness: There is no abdominal tenderness.     Hernia: No hernia is present.  Musculoskeletal:     Cervical back: Neck supple.     Right lower leg: No edema.     Left lower leg: No edema.  Skin:    Findings: No rash.  Neurological:     Mental Status: He is alert and oriented to person, place, and time.     Motor: No weakness.  Psychiatric:        Mood and Affect: Mood normal.        Behavior: Behavior normal.    BP 140/82    Pulse 80    Ht 6\' 1"  (1.854 m)    Wt 233 lb 9.6 oz (106 kg)    BMI 30.82 kg/m  Wt Readings from Last 3 Encounters:  03/10/21 233 lb 9.6 oz (106 kg)  01/08/21 225 lb (102.1 kg)  12/24/20 225 lb (102.1 kg)      Health Maintenance Due  Topic Date Due   COVID-19 Vaccine (1) Never done   FOOT EXAM  Never done   OPHTHALMOLOGY EXAM  Never done   Hepatitis C Screening  Never done   Zoster Vaccines- Shingrix (1 of 2) Never done   TETANUS/TDAP  07/12/2020   INFLUENZA VACCINE  Never done    There are no preventive care reminders to display for this patient.  Lab Results  Component Value Date   TSH 7.19 (H) 09/27/2011   Lab Results  Component Value Date   WBC 12.9 (H) 10/22/2015   HGB 11.6 (L) 10/22/2015   HCT 34.7 (L) 10/22/2015   MCV 84.5 10/22/2015   PLT 155 10/22/2015   Lab Results  Component Value Date   NA 138 10/22/2015   K 3.3 (L) 10/22/2015   CO2 27 10/22/2015   GLUCOSE 125 (H) 10/22/2015   BUN 16 10/22/2015   CREATININE 1.25 (H) 10/22/2015   BILITOT 1.5 (H) 10/22/2015   ALKPHOS 73 10/22/2015   AST 47 (H) 10/22/2015   ALT 31 10/22/2015   PROT 7.3 10/22/2015   ALBUMIN 3.8 10/22/2015   CALCIUM 8.8 (L) 10/22/2015   ANIONGAP 4 (L) 10/22/2015   No results found for: CHOL No results found for: HDL No results found for: LDLCALC No results found for: TRIG No results found for: CHOLHDL Lab Results  Component Value Date   HGBA1C 6.8 03/10/2021      Assessment & Plan:   Problem List Items Addressed This Visit       Cardiovascular and Mediastinum   Essential hypertension     Patient denies any chest pain or shortness of breath there is no history of palpitation or paroxysmal nocturnal dyspnea   patient was advised to follow low-salt low-cholesterol diet    ideally I want to keep systolic blood pressure below 130 mmHg, patient was asked to check blood pressure one times a week and give me a report on that.  Patient will be follow-up in 3 months  or earlier as needed, patient will call me back for any change in the cardiovascular symptoms Patient was advised to buy a book from local bookstore concerning blood pressure and read several chapters  every day.  This will  be supplemented by some of the material we will give him from the office.  Patient should also utilize other resources like YouTube and Internet to learn more about the blood pressure and the diet.        Respiratory   Sleep apnea    Patient says he is using CPAP machine        Endocrine   Diabetes mellitus ([place]) - Primary    - The patient's blood sugar is labile on med. - The patient will continue the current treatment regimen.  - I encouraged the patient to regularly check blood sugar.  - I encouraged the patient to monitor diet. I encouraged the patient to eat low-carb and low-sugar to help prevent blood sugar spikes.  - I encouraged the patient to continue following their prescribed treatment plan for diabetes - I informed the patient to get help if blood sugar drops below 54mg /dL, or if suddenly have trouble thinking clearly or breathing.  Patient was advised to buy a book on diabetes from a local bookstore or from [place]  Patient should read 2 chapters every day to keep the motivation going, this is in addition to some of the materials we provided them from the office.  There are other resources on the Internet like YouTube and wilkipedia to get an education on the diabetes      Relevant Orders  POCT HgB A1C (Completed)   POCT glucose (manual entry) (Completed)   Hypothyroidism    Stable at the present time        Other   Chronic pain syndrome    Patient has a pain contract       No orders of the defined types were placed in this encounter.   Follow-up: No follow-ups on file.    Cletis Athens, MD

## 2021-03-10 NOTE — Assessment & Plan Note (Signed)

## 2021-03-10 NOTE — Assessment & Plan Note (Signed)
Patient says he is using CPAP machine

## 2021-03-30 ENCOUNTER — Other Ambulatory Visit: Payer: Self-pay | Admitting: Internal Medicine

## 2021-03-30 DIAGNOSIS — N400 Enlarged prostate without lower urinary tract symptoms: Secondary | ICD-10-CM

## 2021-04-02 ENCOUNTER — Encounter: Payer: Medicare PPO | Admitting: Student in an Organized Health Care Education/Training Program

## 2021-04-07 ENCOUNTER — Ambulatory Visit
Payer: No Typology Code available for payment source | Attending: Student in an Organized Health Care Education/Training Program | Admitting: Student in an Organized Health Care Education/Training Program

## 2021-04-07 ENCOUNTER — Encounter: Payer: Self-pay | Admitting: Student in an Organized Health Care Education/Training Program

## 2021-04-07 ENCOUNTER — Other Ambulatory Visit: Payer: Self-pay

## 2021-04-07 VITALS — BP 162/90 | HR 79 | Temp 97.9°F | Resp 16 | Ht 73.0 in | Wt 234.0 lb

## 2021-04-07 DIAGNOSIS — M75102 Unspecified rotator cuff tear or rupture of left shoulder, not specified as traumatic: Secondary | ICD-10-CM | POA: Insufficient documentation

## 2021-04-07 DIAGNOSIS — G894 Chronic pain syndrome: Secondary | ICD-10-CM | POA: Insufficient documentation

## 2021-04-07 DIAGNOSIS — M12812 Other specific arthropathies, not elsewhere classified, left shoulder: Secondary | ICD-10-CM | POA: Diagnosis not present

## 2021-04-07 DIAGNOSIS — M47816 Spondylosis without myelopathy or radiculopathy, lumbar region: Secondary | ICD-10-CM | POA: Insufficient documentation

## 2021-04-07 DIAGNOSIS — M51369 Other intervertebral disc degeneration, lumbar region without mention of lumbar back pain or lower extremity pain: Secondary | ICD-10-CM | POA: Insufficient documentation

## 2021-04-07 DIAGNOSIS — M5416 Radiculopathy, lumbar region: Secondary | ICD-10-CM | POA: Insufficient documentation

## 2021-04-07 DIAGNOSIS — M48062 Spinal stenosis, lumbar region with neurogenic claudication: Secondary | ICD-10-CM | POA: Insufficient documentation

## 2021-04-07 DIAGNOSIS — M19012 Primary osteoarthritis, left shoulder: Secondary | ICD-10-CM | POA: Insufficient documentation

## 2021-04-07 DIAGNOSIS — Z0289 Encounter for other administrative examinations: Secondary | ICD-10-CM | POA: Insufficient documentation

## 2021-04-07 DIAGNOSIS — G8929 Other chronic pain: Secondary | ICD-10-CM | POA: Insufficient documentation

## 2021-04-07 DIAGNOSIS — M5136 Other intervertebral disc degeneration, lumbar region: Secondary | ICD-10-CM | POA: Diagnosis not present

## 2021-04-07 MED ORDER — OXYCODONE-ACETAMINOPHEN 10-325 MG PO TABS: 1.0000 | ORAL_TABLET | Freq: Three times a day (TID) | ORAL | 0 refills | Status: DC | PRN

## 2021-04-07 MED ORDER — OXYCODONE-ACETAMINOPHEN 10-325 MG PO TABS
1.0000 | ORAL_TABLET | Freq: Three times a day (TID) | ORAL | 0 refills | Status: DC | PRN
Start: 2021-04-07 — End: 2021-05-07

## 2021-04-07 NOTE — Progress Notes (Signed)
PROVIDER NOTE: Information contained herein reflects review and annotations entered in association with encounter. Interpretation of such information and data should be left to medically-trained personnel. Information provided to patient can be located elsewhere in the medical record under "Patient Instructions". Document created using STT-dictation technology, any transcriptional errors that may result from process are unintentional.  ?  ?Patient: Paul Ramus [name]  Service Category: E/M  Provider: Gillis Santa, MD  ?DOB: 07-Feb-1945  DOS: 04/07/2021  Specialty: Interventional Pain Management  ?MRN: [id number]  Setting: Ambulatory outpatient  PCP: Cletis Athens, MD  ?Type: Established Patient    Referring Provider: Cletis Athens, MD  ?Location: Office  Delivery: Face-to-face    ? ?HPI  ?[name], a 76 y.o. 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 [name] primary complain today is Shoulder Pain (left) and Back Pain (lower) ? ?Last encounter: My last encounter with him was on 01/08/21 ?Pertinent problems: Mr. Hofman has Chronic back pain; Disorder of rotator cuff, left; Primary osteoarthritis of left shoulder; Left rotator cuff tear arthropathy; Spinal stenosis, lumbar region, with neurogenic claudication; Chronic left shoulder pain; Chronic pain syndrome; and Pain management contract signed on their pertinent problem list. ?Pain Assessment: Severity of Chronic pain is reported as a 8 /10. Location: Back Lower/both legs to the feet. Onset: More than a month ago. Quality: Other (Comment), Sharp (bad). Timing: Constant. Modifying factor(s): Oxycodone, Arthritis BC. ?Vitals:  height is '6\' 1"'$  (1.854 m) and weight is 234 lb (106.1 kg). His temporal temperature is 97.9 ?F (36.6 ?C). His blood pressure is 162/90 (abnormal) and his pulse is 79. His respiration is 16 and oxygen saturation is 100%.  ? ?Reason for encounter: medication  management ? ?Patient presents today for medication management.  He states that he has had a more difficult time managing his pain since we reduced his oxycodone from 10 mg every 8 hours as needed to 7.5 mg every 8 hours as needed.  He states that he has to take more tablets on certain days.  We will go back to 10 mg every 8 hours as needed. ? ?Continues to endorse improvement in bilateral shoulder pain after shoulder steroid injections. ? ?Complaining of low back pain with radiation into bilateral legs and feet.  He also endorses weakness in the posterior aspects of his thighs and has difficulty walking an extended period of time.  This is related to lumbar degenerative disc disease, lumbar facet arthropathy, severe lumbar spinal stenosis with neurogenic claudication.  I have offered Mr. Gullo a lumbar epidural steroid injection which she has done over 4 years ago.  Risk and benefits reviewed and patient would like to proceed. ? ?Pharmacotherapy Assessment  ?Analgesic: Percocet 10 mg 3 times daily as needed, quantity 90/month  ? ?Monitoring: ?[place] PMP: PDMP reviewed during this encounter.       ?Pharmacotherapy: No side-effects or adverse reactions reported. ?Compliance: No problems identified. ?Effectiveness: Clinically acceptable.  UDS:  ?Summary  ?Date Value Ref Range Status  ?10/28/2020 Note  Final  ?  Comment:  ?  ==================================================================== ?Compliance Drug Analysis, Ur ?==================================================================== ?Test                             Result       Flag       Units ? ?Drug Present not Declared for Prescription Verification ?  Oxycodone  85           UNEXPECTED ng/mg creat ?  Oxymorphone                    256          UNEXPECTED ng/mg creat ?  Noroxycodone                   318          UNEXPECTED ng/mg creat ?  Noroxymorphone                 151          UNEXPECTED ng/mg creat ?   Sources of oxycodone are  scheduled prescription medications. ?   Oxymorphone, noroxycodone, and noroxymorphone are expected ?   metabolites of oxycodone. Oxymorphone is also available as a ?   scheduled prescription medication. ? ?  Acetaminophen                  PRESENT      UNEXPECTED ?  Ibuprofen                      PRESENT      UNEXPECTED ? ?Drug Absent but Declared for Prescription Verification ?  Tramadol                       Not Detected UNEXPECTED ng/mg creat ?  Pregabalin                     Not Detected UNEXPECTED ?  Cyclobenzaprine                Not Detected UNEXPECTED ?  Salicylate                     Not Detected UNEXPECTED ?   Salicylate, as indicated in the declared medication list, is not ?   always detected even when used as directed. ? ?   Aspirin, as indicated in the declared medication list, is not always ?   detected even when used as directed. ? ?==================================================================== ?Test                      Result    Flag   Units      Ref Range ?  Creatinine              72               mg/dL      >=20 ?==================================================================== ?Declared Medications: ? The flagging and interpretation on this report are based on the ? following declared medications.  Unexpected results may arise from ? inaccuracies in the declared medications. ? ? **Note: The testing scope of this panel includes these medications: ? ? Cyclobenzaprine (Flexeril) ? Pregabalin (Lyrica) ? Tramadol (Ultram) ? ? **Note: The testing scope of this panel does not include small to ? moderate amounts of these reported medications: ? ? Aspirin ? Salicylate (Salicylamide) ? ? **Note: The testing scope of this panel does not include the ? following reported medications: ? ? Amlodipine (Norvasc) ? Caffeine ? Doxycycline ? Hydrochlorothiazide (Zestoretic) ? Levothyroxine (Synthroid) ? Lisinopril (Zestoretic) ? Metformin (Glucophage) ? Omeprazole (Prilosec) ? Rosuvastatin (Crestor) ?  Tadalafil (Cialis) ? Tamsulosin (Flomax) ? Topical ?==================================================================== ?For clinical consultation, please call [phone]. ?==================================================================== ?  ?  ? ? ? ? ?ROS  ?Constitutional: Denies  any fever or chills ?Gastrointestinal: No reported hemesis, hematochezia, vomiting, or acute GI distress ?Musculoskeletal:  Low back pain, ?Neurological: No reported episodes of acute onset apraxia, aphasia, dysarthria, agnosia, amnesia, paralysis, loss of coordination, or loss of consciousness ? ?Medication Review  ?Aspirin-Salicylamide-Caffeine, B-D SINGLE USE SWABS REGULAR, Lancets 33G, True Metrix Level 1, True Metrix Meter, amLODipine, cyclobenzaprine, glucose blood, levothyroxine, lisinopril-hydrochlorothiazide, metFORMIN, omeprazole, and oxyCODONE-acetaminophen ? ?History Review  ?Allergy: Mr. Betzold has no active allergies. ?Drug: Mr. Haque  reports no history of drug use. ?Alcohol:  reports no history of alcohol use. ?Tobacco:  reports that he has quit smoking. He has never used smokeless tobacco. ?Social: Mr. Sky  reports that he has quit smoking. He has never used smokeless tobacco. He reports that he does not drink alcohol and does not use drugs. ?Medical:  has a past medical history of Acute prostatitis, BPH (benign prostatic hyperplasia), Chronic kidney disease, Dysplasia of prostate, Erectile dysfunction, HTN (hypertension), Hypogonadism in male, Orchitis and epididymitis, Over weight, Palindromic rheumatism, hand, Prostatitis, Rectum pain, Testicle swelling, Testicle tenderness, and Testicular mass. ?Surgical: Mr. Prichett  has a past surgical history that includes Back surgery and head surgery. ?Family: family history includes Cancer in his maternal aunt. ? ?Laboratory Chemistry Profile  ? ?Renal ?Lab Results  ?Component Value Date  ? BUN 16 10/22/2015  ? CREATININE 1.25 (H) 10/22/2015  ? GFRAA >60  10/22/2015  ? GFRNONAA 57 (L) 10/22/2015  ?  Hepatic ?Lab Results  ?Component Value Date  ? AST 47 (H) 10/22/2015  ? ALT 31 10/22/2015  ? ALBUMIN 3.8 10/22/2015  ? ALKPHOS 73 10/22/2015  ? LIPASE 357 02/25/2012  ?

## 2021-04-07 NOTE — Patient Instructions (Signed)
GENERAL RISKS AND COMPLICATIONS ? ?What are the risk, side effects and possible complications? ?Generally speaking, most procedures are safe.  However, with any procedure there are risks, side effects, and the possibility of complications.  The risks and complications are dependent upon the sites that are lesioned, or the type of nerve block to be performed.  The closer the procedure is to the spine, the more serious the risks are.  Great care is taken when placing the radio frequency needles, block needles or lesioning probes, but sometimes complications can occur. ?Infection: Any time there is an injection through the skin, there is a risk of infection.  This is why sterile conditions are used for these blocks.  There are four possible types of infection. ?Localized skin infection. ?Central Nervous System Infection-This can be in the form of Meningitis, which can be deadly. ?Epidural Infections-This can be in the form of an epidural abscess, which can cause pressure inside of the spine, causing compression of the spinal cord with subsequent paralysis. This would require an emergency surgery to decompress, and there are no guarantees that the patient would recover from the paralysis. ?Discitis-This is an infection of the intervertebral discs.  It occurs in about 1% of discography procedures.  It is difficult to treat and it may lead to surgery. ? ?      2. Pain: the needles have to go through skin and soft tissues, will cause soreness. ?      3. Damage to internal structures:  The nerves to be lesioned may be near blood vessels or   ? other nerves which can be potentially damaged. ?      4. Bleeding: Bleeding is more common if the patient is taking blood thinners such as  aspirin, Coumadin, Ticiid, Plavix, etc., or if he/she have some genetic predisposition  such as hemophilia. Bleeding into the spinal canal can cause compression of the spinal  cord with subsequent paralysis.  This would require an emergency  surgery to  decompress and there are no guarantees that the patient would recover from the  paralysis. ?      5. Pneumothorax:  Puncturing of a lung is a possibility, every time a needle is introduced in  the area of the chest or upper back.  Pneumothorax refers to free air around the  collapsed lung(s), inside of the thoracic cavity (chest cavity).  Another two possible  complications related to a similar event would include: Hemothorax and Chylothorax.   These are variations of the Pneumothorax, where instead of air around the collapsed  lung(s), you may have blood or chyle, respectively. ?      6. Spinal headaches: They may occur with any procedures in the area of the spine. ?      7. Persistent CSF (Cerebro-Spinal Fluid) leakage: This is a rare problem, but may occur  with prolonged intrathecal or epidural catheters either due to the formation of a fistulous  track or a dural tear. ?      8. Nerve damage: By working so close to the spinal cord, there is always a possibility of  nerve damage, which could be as serious as a permanent spinal cord injury with  paralysis. ?      9. Death:  Although rare, severe deadly allergic reactions known as "Anaphylactic  reaction" can occur to any of the medications used. ?     10. Worsening of the symptoms:  We can always make thing worse. ? ?What are the chances  of something like this happening? ?Chances of any of this occuring are extremely low.  By statistics, you have more of a chance of getting killed in a motor vehicle accident: while driving to the hospital than any of the above occurring .  Nevertheless, you should be aware that they are possibilities.  In general, it is similar to taking a shower.  Everybody knows that you can slip, hit your head and get killed.  Does that mean that you should not shower again?  Nevertheless always keep in mind that statistics do not mean anything if you happen to be on the wrong side of them.  Even if a procedure has a 1 (one) in a  1,000,000 (million) chance of going wrong, it you happen to be that one..Also, keep in mind that by statistics, you have more of a chance of having something go wrong when taking medications. ? ?Who should not have this procedure? ?If you are on a blood thinning medication (e.g. Coumadin, Plavix, see list of "Blood Thinners"), or if you have an active infection going on, you should not have the procedure.  If you are taking any blood thinners, please inform your physician. ? ?How should I prepare for this procedure? ?Do not eat or drink anything at least six hours prior to the procedure. ?Bring a driver with you .  It cannot be a taxi. ?Come accompanied by an adult that can drive you back, and that is strong enough to help you if your legs get weak or numb from the local anesthetic. ?Take all of your medicines the morning of the procedure with just enough water to swallow them. ?If you have diabetes, make sure that you are scheduled to have your procedure done first thing in the morning, whenever possible. ?If you have diabetes, take only half of your insulin dose and notify our nurse that you have done so as soon as you arrive at the clinic. ?If you are diabetic, but only take blood sugar pills (oral hypoglycemic), then do not take them on the morning of your procedure.  You may take them after you have had the procedure. ?Do not take aspirin or any aspirin-containing medications, at least eleven (11) days prior to the procedure.  They may prolong bleeding. ?Wear loose fitting clothing that may be easy to take off and that you would not mind if it got stained with Betadine or blood. ?Do not wear any jewelry or perfume ?Remove any nail coloring.  It will interfere with some of our monitoring equipment. ? ?NOTE: Remember that this is not meant to be interpreted as a complete list of all possible complications.  Unforeseen problems may occur. ? ?BLOOD THINNERS ?The following drugs contain aspirin or other products,  which can cause increased bleeding during surgery and should not be taken for 2 weeks prior to and 1 week after surgery.  If you should need take something for relief of minor pain, you may take acetaminophen which is found in Tylenol,m Datril, Anacin-3 and Panadol. It is not blood thinner. The products listed below are.  Do not take any of the products listed below in addition to any listed on your instruction sheet. ? ?A.P.C or A.P.C with Codeine Codeine Phosphate Capsules #3 Ibuprofen Ridaura  ?ABC compound Congesprin Imuran rimadil  ?Advil Cope Indocin Robaxisal  ?Alka-Seltzer Effervescent Pain Reliever and Antacid Coricidin or Coricidin-D ? Indomethacin Rufen  ?Alka-Seltzer plus Cold Medicine Cosprin Ketoprofen S-A-C Tablets  ?Anacin Analgesic Tablets or Capsules Coumadin  Korlgesic Salflex  ?Anacin Extra Strength Analgesic tablets or capsules CP-2 Tablets Lanoril Salicylate  ?Anaprox Cuprimine Capsules Levenox Salocol  ?Anexsia-D Dalteparin Magan Salsalate  ?Anodynos Darvon compound Magnesium Salicylate Sine-off  ?Ansaid Dasin Capsules Magsal Sodium Salicylate  ?Anturane Depen Capsules Marnal Soma  ?APF Arthritis pain formula Dewitt's Pills Measurin Stanback  ?Argesic Dia-Gesic Meclofenamic Sulfinpyrazone  ?Arthritis Bayer Timed Release Aspirin Diclofenac Meclomen Sulindac  ?Arthritis pain formula Anacin Dicumarol Medipren Supac  ?Analgesic (Safety coated) Arthralgen Diffunasal Mefanamic Suprofen  ?Arthritis Strength Bufferin Dihydrocodeine Mepro Compound Suprol  ?Arthropan liquid Dopirydamole Methcarbomol with Aspirin Synalgos  ?ASA tablets/Enseals Disalcid Micrainin Tagament  ?Ascriptin Doan's Midol Talwin  ?Ascriptin A/D Dolene Mobidin Tanderil  ?Ascriptin Extra Strength Dolobid Moblgesic Ticlid  ?Ascriptin with Codeine Doloprin or Doloprin with Codeine Momentum Tolectin  ?Asperbuf Duoprin Mono-gesic Trendar  ?Aspergum Duradyne Motrin or Motrin IB Triminicin  ?Aspirin plain, buffered or enteric coated  Durasal Myochrisine Trigesic  ?Aspirin Suppositories Easprin Nalfon Trillsate  ?Aspirin with Codeine Ecotrin Regular or Extra Strength Naprosyn Uracel  ?Atromid-S Efficin Naproxen Ursinus  ?Auranofin Capsules Elmiro

## 2021-04-07 NOTE — Progress Notes (Signed)
Nursing Pain Medication Assessment:  ?Safety precautions to be maintained throughout the outpatient stay will include: orient to surroundings, keep bed in low position, maintain call bell within reach at all times, provide assistance with transfer out of bed and ambulation.  ?Medication Inspection Compliance: Pill count conducted under aseptic conditions, in front of the patient. Neither the pills nor the bottle was removed from the patient's sight at any time. Once count was completed pills were immediately returned to the patient in their original bottle. ? ?Medication: Oxycodone/APAP ?Pill/Patch Count:  1 of 90 pills remain ?Pill/Patch Appearance: Markings consistent with prescribed medication ?Bottle Appearance: Standard pharmacy container. Clearly labeled. ?Filled Date: did not bring the most recent bottle ?Last Medication intake:  Yesterday ?

## 2021-04-13 ENCOUNTER — Ambulatory Visit
Payer: No Typology Code available for payment source | Admitting: Student in an Organized Health Care Education/Training Program

## 2021-04-15 ENCOUNTER — Other Ambulatory Visit: Payer: Self-pay

## 2021-04-15 ENCOUNTER — Ambulatory Visit
Admission: RE | Admit: 2021-04-15 | Discharge: 2021-04-15 | Disposition: A | Discharge status: Home / Self Care | Payer: No Typology Code available for payment source | Source: Ambulatory Visit | Attending: Student in an Organized Health Care Education/Training Program | Admitting: Student in an Organized Health Care Education/Training Program

## 2021-04-15 ENCOUNTER — Ambulatory Visit (HOSPITAL_BASED_OUTPATIENT_CLINIC_OR_DEPARTMENT_OTHER)
Payer: No Typology Code available for payment source | Admitting: Student in an Organized Health Care Education/Training Program

## 2021-04-15 ENCOUNTER — Encounter: Payer: Self-pay | Admitting: Student in an Organized Health Care Education/Training Program

## 2021-04-15 VITALS — BP 170/95 | HR 56 | Temp 97.0°F | Resp 16 | Ht 72.0 in | Wt 234.0 lb

## 2021-04-15 DIAGNOSIS — M48062 Spinal stenosis, lumbar region with neurogenic claudication: Secondary | ICD-10-CM

## 2021-04-15 DIAGNOSIS — G894 Chronic pain syndrome: Secondary | ICD-10-CM | POA: Insufficient documentation

## 2021-04-15 DIAGNOSIS — M5416 Radiculopathy, lumbar region: Secondary | ICD-10-CM

## 2021-04-15 DIAGNOSIS — G8929 Other chronic pain: Secondary | ICD-10-CM

## 2021-04-15 MED ORDER — SODIUM CHLORIDE (PF) 0.9 % IJ SOLN
INTRAMUSCULAR | Status: AC
  Filled 2021-04-15: qty 10

## 2021-04-15 MED ORDER — LIDOCAINE HCL 2 % IJ SOLN
20.0000 mL | Freq: Once | INTRAMUSCULAR | Status: AC
  Administered 2021-04-15: 100 mg
  Filled 2021-04-15: qty 40

## 2021-04-15 MED ORDER — ROPIVACAINE HCL 2 MG/ML IJ SOLN
2.0000 mL | Freq: Once | INTRAMUSCULAR | Status: AC
  Administered 2021-04-15: 2 mL via EPIDURAL

## 2021-04-15 MED ORDER — ROPIVACAINE HCL 2 MG/ML IJ SOLN
INTRAMUSCULAR | Status: AC
  Filled 2021-04-15: qty 20

## 2021-04-15 MED ORDER — DEXAMETHASONE SODIUM PHOSPHATE 10 MG/ML IJ SOLN
INTRAMUSCULAR | Status: AC
  Filled 2021-04-15: qty 1

## 2021-04-15 MED ORDER — DEXAMETHASONE SODIUM PHOSPHATE 10 MG/ML IJ SOLN
10.0000 mg | Freq: Once | INTRAMUSCULAR | Status: AC
  Administered 2021-04-15: 10 mg

## 2021-04-15 MED ORDER — SODIUM CHLORIDE 0.9% FLUSH
2.0000 mL | Freq: Once | INTRAVENOUS | Status: AC
  Administered 2021-04-15: 2 mL

## 2021-04-15 MED ORDER — IOHEXOL 180 MG/ML  SOLN
10.0000 mL | Freq: Once | INTRAMUSCULAR | Status: AC
  Administered 2021-04-15: 5 mL via EPIDURAL

## 2021-04-15 NOTE — Patient Instructions (Signed)
Pain Management Discharge Instructions  General Discharge Instructions :  If you need to reach your doctor call: Monday-Friday 8:00 am - 4:00 pm at [phone] or toll free [phone].  After clinic hours [phone] to have operator reach doctor.  Bring all of your medication bottles to all your appointments in the pain clinic.  To cancel or reschedule your appointment with Pain Management please remember to call 24 hours in advance to avoid a fee.  Refer to the educational materials which you have been given on: General Risks, I had my Procedure. Discharge Instructions, Post Sedation.  Post Procedure Instructions:  The drugs you were given will stay in your system until tomorrow, so for the next 24 hours you should not drive, make any legal decisions or drink any alcoholic beverages.  You may eat anything you prefer, but it is better to start with liquids then soups and crackers, and gradually work up to solid foods.  Please notify your doctor immediately if you have any unusual bleeding, trouble breathing or pain that is not related to your normal pain.  Depending on the type of procedure that was done, some parts of your body may feel week and/or numb.  This usually clears up by tonight or the next day.  Walk with the use of an assistive device or accompanied by an adult for the 24 hours.  You may use ice on the affected area for the first 24 hours.  Put ice in a Ziploc bag and cover with a towel and place against area 15 minutes on 15 minutes off.  You may switch to heat after 24 hours.Epidural Steroid Injection Patient Information  Description: The epidural space surrounds the nerves as they exit the spinal cord.  In some patients, the nerves can be compressed and inflamed by a bulging disc or a tight spinal canal (spinal stenosis).  By injecting steroids into the epidural space, we can bring irritated nerves into direct contact with a potentially helpful medication.  These  steroids act directly on the irritated nerves and can reduce swelling and inflammation which often leads to decreased pain.  Epidural steroids may be injected anywhere along the spine and from the neck to the low back depending upon the location of your pain.   After numbing the skin with local anesthetic (like Novocaine), a small needle is passed into the epidural space slowly.  You may experience a sensation of pressure while this is being done.  The entire block usually last less than 10 minutes.  Conditions which may be treated by epidural steroids:  Low back and leg pain Neck and arm pain Spinal stenosis Post-laminectomy syndrome Herpes zoster (shingles) pain Pain from compression fractures  Preparation for the injection:  Do not eat any solid food or dairy products within 8 hours of your appointment.  You may drink clear liquids up to 3 hours before appointment.  Clear liquids include water, black coffee, juice or soda.  No milk or cream please. You may take your regular medication, including pain medications, with a sip of water before your appointment  Diabetics should hold regular insulin (if taken separately) and take 1/2 normal NPH dos the morning of the procedure.  Carry some sugar containing items with you to your appointment. A driver must accompany you and be prepared to drive you home after your procedure.  Bring all your current medications with your. An IV may be inserted and sedation may be given at the discretion of the physician.     A blood pressure cuff, EKG and other monitors will often be applied during the procedure.  Some patients may need to have extra oxygen administered for a short period. You will be asked to provide medical information, including your allergies, prior to the procedure.  We must know immediately if you are taking blood thinners (like Coumadin/Warfarin)  Or if you are allergic to IV iodine contrast (dye). We must know if you could possible be  pregnant.  Possible side-effects: Bleeding from needle site Infection (rare, may require surgery) Nerve injury (rare) Numbness & tingling (temporary) Difficulty urinating (rare, temporary) Spinal headache ( a headache worse with upright posture) Light -headedness (temporary) Pain at injection site (several days) Decreased blood pressure (temporary) Weakness in arm/leg (temporary) Pressure sensation in back/neck (temporary)  Call if you experience: Fever/chills associated with headache or increased back/neck pain. Headache worsened by an upright position. New onset weakness or numbness of an extremity below the injection site Hives or difficulty breathing (go to the emergency room) Inflammation or drainage at the infection site Severe back/neck pain Any new symptoms which are concerning to you  Please note:  Although the local anesthetic injected can often make your back or neck feel good for several hours after the injection, the pain will likely return.  It takes 3-7 days for steroids to work in the epidural space.  You may not notice any pain relief for at least that one week.  If effective, we will often do a series of three injections spaced 3-6 weeks apart to maximally decrease your pain.  After the initial series, we generally will wait several months before considering a repeat injection of the same type.  If you have any questions, please call [phone] [facility] 

## 2021-04-15 NOTE — Progress Notes (Signed)
Safety precautions to be maintained throughout the outpatient stay will include: orient to surroundings, keep bed in low position, maintain call bell within reach at all times, provide assistance with transfer out of bed and ambulation.  

## 2021-04-15 NOTE — Progress Notes (Signed)
PROVIDER NOTE: Interpretation of information contained herein should be left to medically-trained personnel. Specific patient instructions are provided elsewhere under "Patient Instructions" section of medical record. This document was created in part using STT-dictation technology, any transcriptional errors that may result from this process are unintentional.  Patient: Paul Diebold [name] Type: Established DOB: 09-15-1945 MRN: [id number] PCP: Corky Downs, MD  Service: Procedure DOS: 04/15/2021 Setting: Ambulatory Location: Ambulatory outpatient facility Delivery: Face-to-face Provider: Edward Jolly, MD Specialty: Interventional Pain Management Specialty designation: 09 Location: Outpatient facility Ref. Prov.: Corky Downs, MD    Primary Reason for Visit: Interventional Pain Management Treatment. CC: Back Pain (low)   Procedure:           Type: Lumbar epidural steroid injection (LESI) (interlaminar) #1    Laterality: Midline   Level:  L5-S1 Level.  Imaging: Fluoroscopic guidance Anesthesia: Local anesthesia (1-2% Lidocaine) Anxiolysis: None                 Sedation: None. DOS: 04/15/2021  Performed by: Edward Jolly, MD  Purpose: Diagnostic/Therapeutic Indications: Lumbar radicular pain of intraspinal etiology of more than 4 weeks that has failed to respond to conservative therapy and is severe enough to impact quality of life or function. 1. Lumbar radicular pain   2. Chronic radicular lumbar pain   3. Spinal stenosis, lumbar region, with neurogenic claudication   4. Chronic pain syndrome    NAS-11 Pain score:   Pre-procedure: 8 /10   Post-procedure: 4 /10      Position / Prep / Materials:  Position: Prone w/ head of the table raised (slight reverse trendelenburg) to facilitate breathing.  Prep solution: DuraPrep (Iodine Povacrylex [0.7% available iodine] and Isopropyl Alcohol, 74% w/w) Prep Area: Entire Posterior Lumbar Region from lower scapular tip down to mid  buttocks area and from flank to flank. Materials:  Tray: Epidural tray Needle(s):  Type: Epidural needle          Gauge (G):  22 Length: Regular (3.5-in) Qty: 1  Pre-op H&P Assessment:  Paul Briggs is a 76 y.o. (year old), male patient, seen today for interventional treatment. He  has a past surgical history that includes Back surgery and head surgery. Paul Briggs has a current medication list which includes the following prescription(s): b-d single use swabs regular, amlodipine, aspirin-salicylamide-caffeine, true metrix level 1, true metrix meter, cyclobenzaprine, true metrix pro blood glucose, lancets 33g, levothyroxine, lisinopril-hydrochlorothiazide, omeprazole, oxycodone-acetaminophen, [START ON 05/07/2021] oxycodone-acetaminophen, [START ON 06/06/2021] oxycodone-acetaminophen, and metformin. His primarily concern today is the Back Pain (low)  Initial Vital Signs:  Pulse/HCG Rate:  (!) 56 ECG Heart Rate: (!) 58 Temp:  (!) 97 F (36.1 C) Resp: 16 BP:  (!) 182/80 SpO2: 100 %  BMI: Estimated body mass index is 31.74 kg/m as calculated from the following:   Height as of this encounter: 6' (1.829 m).   Weight as of this encounter: 234 lb (106.1 kg).  Risk Assessment: Allergies: Reviewed. He has no active allergies.  Allergy Precautions: None required Coagulopathies: Reviewed. None identified.  Blood-thinner therapy: None at this time Active Infection(s): Reviewed. None identified. Paul Briggs is afebrile  Site Confirmation: Paul Briggs was asked to confirm the procedure and laterality before marking the site Procedure checklist: Completed Consent: Before the procedure and under the influence of no sedative(s), amnesic(s), or anxiolytics, the patient was informed of the treatment options, risks and possible complications. To fulfill our ethical and legal obligations, as recommended by the American Medical Association's Code of Ethics, I have  informed the patient of my  clinical impression; the nature and purpose of the treatment or procedure; the risks, benefits, and possible complications of the intervention; the alternatives, including doing nothing; the risk(s) and benefit(s) of the alternative treatment(s) or procedure(s); and the risk(s) and benefit(s) of doing nothing. The patient was provided information about the general risks and possible complications associated with the procedure. These may include, but are not limited to: failure to achieve desired goals, infection, bleeding, organ or nerve damage, allergic reactions, paralysis, and death. In addition, the patient was informed of those risks and complications associated to Spine-related procedures, such as failure to decrease pain; infection (i.e.: Meningitis, epidural or intraspinal abscess); bleeding (i.e.: epidural hematoma, subarachnoid hemorrhage, or any other type of intraspinal or peri-dural bleeding); organ or nerve damage (i.e.: Any type of peripheral nerve, nerve root, or spinal cord injury) with subsequent damage to sensory, motor, and/or autonomic systems, resulting in permanent pain, numbness, and/or weakness of one or several areas of the body; allergic reactions; (i.e.: anaphylactic reaction); and/or death. Furthermore, the patient was informed of those risks and complications associated with the medications. These include, but are not limited to: allergic reactions (i.e.: anaphylactic or anaphylactoid reaction(s)); adrenal axis suppression; blood sugar elevation that in diabetics may result in ketoacidosis or comma; water retention that in patients with history of congestive heart failure may result in shortness of breath, pulmonary edema, and decompensation with resultant heart failure; weight gain; swelling or edema; medication-induced neural toxicity; particulate matter embolism and blood vessel occlusion with resultant organ, and/or nervous system infarction; and/or aseptic necrosis of one or  more joints. Finally, the patient was informed that Medicine is not an exact science; therefore, there is also the possibility of unforeseen or unpredictable risks and/or possible complications that may result in a catastrophic outcome. The patient indicated having understood very clearly. We have given the patient no guarantees and we have made no promises. Enough time was given to the patient to ask questions, all of which were answered to the patient's satisfaction. Paul Briggs has indicated that he wanted to continue with the procedure. Attestation: I, the ordering provider, attest that I have discussed with the patient the benefits, risks, side-effects, alternatives, likelihood of achieving goals, and potential problems during recovery for the procedure that I have provided informed consent. Date  Time: 04/15/2021  8:17 AM  Pre-Procedure Preparation:  Monitoring: As per clinic protocol. Respiration, ETCO2, SpO2, BP, heart rate and rhythm monitor placed and checked for adequate function Safety Precautions: Patient was assessed for positional comfort and pressure points before starting the procedure. Time-out: I initiated and conducted the "Time-out" before starting the procedure, as per protocol. The patient was asked to participate by confirming the accuracy of the "Time Out" information. Verification of the correct person, site, and procedure were performed and confirmed by me, the nursing staff, and the patient. "Time-out" conducted as per Joint Commission's Universal Protocol (UP.01.01.01). Time: 0851  Description/Narrative of Procedure:          Target: Epidural space via interlaminar opening, initially targeting the lower laminar border of the superior vertebral body. Region: Lumbar Approach: Percutaneous paravertebral  Rationale (medical necessity): procedure needed and proper for the diagnosis and/or treatment of the patient's medical symptoms and needs. Procedural Technique Safety  Precautions: Aspiration looking for blood return was conducted prior to all injections. At no point did we inject any substances, as a needle was being advanced. No attempts were made at seeking any paresthesias. Safe injection practices  and needle disposal techniques used. Medications properly checked for expiration dates. SDV (single dose vial) medications used. Description of the Procedure: Protocol guidelines were followed. The procedure needle was introduced through the skin, ipsilateral to the reported pain, and advanced to the target area. Bone was contacted and the needle walked caudad, until the lamina was cleared. The epidural space was identified using loss-of-resistance technique with 2-3 ml of PF-NaCl (0.9% NSS), in a 5cc LOR glass syringe.   6 cc solution made of 3 cc of preservative-free saline, 2 cc of 0.2% ropivacaine, 1 cc of Decadron 10 mg/cc.   Vitals:   04/15/21 0845 04/15/21 0851 04/15/21 0856 04/15/21 0858  BP: (!) 165/93 (!) 154/99 (!) 170/100 (!) 170/95  Pulse:      Resp: 18 13 15 16   Temp:      SpO2: 100% 100% 100% 100%  Weight:      Height:        Start Time: 0851 hrs. End Time: 0858 hrs.  Imaging Guidance (Spinal):          Type of Imaging Technique: Fluoroscopy Guidance (Spinal) Indication(s): Assistance in needle guidance and placement for procedures requiring needle placement in or near specific anatomical locations not easily accessible without such assistance. Exposure Time: Please see nurses notes. Contrast: Before injecting any contrast, we confirmed that the patient did not have an allergy to iodine, shellfish, or radiological contrast. Once satisfactory needle placement was completed at the desired level, radiological contrast was injected. Contrast injected under live fluoroscopy. No contrast complications. See chart for type and volume of contrast used. Fluoroscopic Guidance: I was personally present during the use of fluoroscopy. "Tunnel Vision  Technique" used to obtain the best possible view of the target area. Parallax error corrected before commencing the procedure. "Direction-depth-direction" technique used to introduce the needle under continuous pulsed fluoroscopy. Once target was reached, antero-posterior, oblique, and lateral fluoroscopic projection used confirm needle placement in all planes. Images permanently stored in EMR. Interpretation: I personally interpreted the imaging intraoperatively. Adequate needle placement confirmed in multiple planes. Appropriate spread of contrast into desired area was observed. No evidence of afferent or efferent intravascular uptake. No intrathecal or subarachnoid spread observed. Permanent images saved into the patient's record.   Post-operative Assessment:  Post-procedure Vital Signs:  Pulse/HCG Rate:  (!) 5662 Temp:  (!) 97 F (36.1 C) Resp: 16 BP:  (!) 170/95 SpO2: 100 %  EBL: None  Complications: No immediate post-treatment complications observed by team, or reported by patient.  Note: The patient tolerated the entire procedure well. A repeat set of vitals were taken after the procedure and the patient was kept under observation following institutional policy, for this type of procedure. Post-procedural neurological assessment was performed, showing return to baseline, prior to discharge. The patient was provided with post-procedure discharge instructions, including a section on how to identify potential problems. Should any problems arise concerning this procedure, the patient was given instructions to immediately contact us, at any time, without hesitation. In any case, we plan to contact the patient by telephone for a follow-up status report regarding this interventional procedure.  5 out of 5 strength bilateral lower extremity: Plantar flexion, dorsiflexion, knee flexion, knee extension.   Comments:  No additional relevant information.  Plan of Care  Orders:  Orders Placed  This Encounter  Procedures   DG PAIN CLINIC C-ARM 1-60 MIN NO REPORT    Intraoperative interpretation by procedural physician at [facility].    Standing Status:  Standing    Number of Occurrences:   1    Order Specific Question:   Reason for exam:    Answer:   Assistance in needle guidance and placement for procedures requiring needle placement in or near specific anatomical locations not easily accessible without such assistance.   Chronic Opioid Analgesic:  Percocet 10 mg 3 times daily as needed, quantity 90/month   Medications ordered for procedure: Meds ordered this encounter  Medications   iohexol (OMNIPAQUE) 180 MG/ML injection 10 mL    Must be Myelogram-compatible. If not available, you may substitute with a water-soluble, non-ionic, hypoallergenic, myelogram-compatible radiological contrast medium.   lidocaine (XYLOCAINE) 2 % (with pres) injection 400 mg   ropivacaine (PF) 2 mg/mL (0.2%) (NAROPIN) injection 2 mL   sodium chloride flush (NS) 0.9 % injection 2 mL   dexamethasone (DECADRON) injection 10 mg   Medications administered: We administered iohexol, lidocaine, ropivacaine (PF) 2 mg/mL (0.2%), sodium chloride flush, and dexamethasone.  See the medical record for exact dosing, route, and time of administration.  Follow-up plan:   Return in about 4 weeks (around 05/13/2021) for Post Procedure Evaluation, virtual.       Left shoulder injection 11/10/20, 12/24/20. L5/S1 ESI 04/15/21    Recent Visits Date Type Provider Dept  04/07/21 Office Visit Edward Jolly, MD [facility]  Showing recent visits within past 90 days and meeting all other requirements Today's Visits Date Type Provider Dept  04/15/21 Procedure visit Edward Jolly, MD [facility]  Showing today's visits and meeting all other requirements Future Appointments Date Type Provider Dept  05/19/21 Appointment Edward Jolly, MD [facility]  06/04/21 Appointment Edward Jolly, MD [facility]  Showing future appointments within next 90 days and meeting all other requirements  Disposition: Discharge home  Discharge (Date  Time): 04/15/2021; 0910 hrs.   Primary Care Physician: Corky Downs, MD Location: [facility] Note by: Edward Jolly, MD Date: 04/15/2021; Time: 9:18 AM  Disclaimer:  Medicine is not an exact science. The only guarantee in medicine is that nothing is guaranteed. It is important to note that the decision to proceed with this intervention was based on the information collected from the patient. The Data and conclusions were drawn from the patient's questionnaire, the interview, and the physical examination. Because the information was provided in large part by the patient, it cannot be guaranteed that it has not been purposely or unconsciously manipulated. Every effort has been made to obtain as much relevant data as possible for this evaluation. It is important to note that the conclusions that lead to this procedure are derived in large part from the available data. Always take into account that the treatment will also be dependent on availability of resources and existing treatment guidelines, considered by other Pain Management Practitioners as being common knowledge and practice, at the time of the intervention. For Medico-Legal purposes, it is also important to point out that variation in procedural techniques and pharmacological choices are the acceptable norm. The indications, contraindications, technique, and results of the above procedure should only be interpreted and judged by a Board-Certified Interventional Pain Specialist with extensive familiarity and expertise in the same exact procedure and technique.

## 2021-04-16 ENCOUNTER — Telehealth: Payer: Self-pay | Admitting: *Deleted

## 2021-04-16 NOTE — Telephone Encounter (Signed)
No problems post procedure. 

## 2021-05-18 NOTE — Progress Notes (Signed)
Patient reports that he is in a lot of pain and does not feel good.  States he feels like he is going to die.  I said please don't talk like that .  He then states at least he would be out of pain.  ?

## 2021-05-19 ENCOUNTER — Encounter: Payer: Self-pay | Admitting: Student in an Organized Health Care Education/Training Program

## 2021-05-19 ENCOUNTER — Ambulatory Visit
Payer: No Typology Code available for payment source | Attending: Student in an Organized Health Care Education/Training Program | Admitting: Student in an Organized Health Care Education/Training Program

## 2021-05-19 DIAGNOSIS — M12812 Other specific arthropathies, not elsewhere classified, left shoulder: Secondary | ICD-10-CM | POA: Diagnosis not present

## 2021-05-19 DIAGNOSIS — M47816 Spondylosis without myelopathy or radiculopathy, lumbar region: Secondary | ICD-10-CM | POA: Diagnosis not present

## 2021-05-19 DIAGNOSIS — G894 Chronic pain syndrome: Secondary | ICD-10-CM

## 2021-05-19 DIAGNOSIS — M5416 Radiculopathy, lumbar region: Secondary | ICD-10-CM

## 2021-05-19 DIAGNOSIS — M5136 Other intervertebral disc degeneration, lumbar region: Secondary | ICD-10-CM

## 2021-05-19 DIAGNOSIS — Z0289 Encounter for other administrative examinations: Secondary | ICD-10-CM | POA: Diagnosis not present

## 2021-05-19 DIAGNOSIS — M75102 Unspecified rotator cuff tear or rupture of left shoulder, not specified as traumatic: Secondary | ICD-10-CM

## 2021-05-19 DIAGNOSIS — M51369 Other intervertebral disc degeneration, lumbar region without mention of lumbar back pain or lower extremity pain: Secondary | ICD-10-CM

## 2021-05-19 DIAGNOSIS — M48062 Spinal stenosis, lumbar region with neurogenic claudication: Secondary | ICD-10-CM

## 2021-05-19 DIAGNOSIS — G8929 Other chronic pain: Secondary | ICD-10-CM | POA: Diagnosis not present

## 2021-05-19 DIAGNOSIS — M19012 Primary osteoarthritis, left shoulder: Secondary | ICD-10-CM | POA: Diagnosis not present

## 2021-05-19 MED ORDER — PREGABALIN 50 MG PO CAPS: ORAL_CAPSULE | ORAL | 0 refills | Status: DC

## 2021-05-19 NOTE — Progress Notes (Signed)
Patient: Paul Ramus [name]  Service Category: E/M  Provider: Gillis Santa, MD  ?DOB: [birth year]  DOS: 05/19/2021  Location: Office  ?MRN: [id number]  Setting: Ambulatory outpatient  Referring Provider: Cletis Athens, MD  ?Type: Established Patient  Specialty: Interventional Pain Management  PCP: Cletis Athens, MD  ?Location: Remote location  Delivery: TeleHealth    ? ?Virtual Encounter - Pain Management ?PROVIDER NOTE: Information contained herein reflects review and annotations entered in association with encounter. Interpretation of such information and data should be left to medically-trained personnel. Information provided to patient can be located elsewhere in the medical record under "Patient Instructions". Document created using STT-dictation technology, any transcriptional errors that may result from process are unintentional.  ?  ?Contact & Pharmacy ?Preferred: [phone] ?Home: [phone] (home) ?Mobile: [phone] (mobile) ?E-mail: [email or web address]  ?[place] ?[address]) [place] [address] ?Phone: [phone] Fax: [phone] ? ?[place], [place] ?[place] [place] ?[place] [address] ?Phone: [phone] Fax: [phone] ? ?[place], [place] ?[address] ?[place] [address] ?Phone: [phone] Fax: [phone] ?  ?Pre-screening  ?Mr. Peel offered "in-person" vs "virtual" encounter. He indicated preferring virtual for this encounter.  ? ?Reason ?COVID-19*  Social distancing based on CDC and AMA recommendations.  ? ?I contacted Deaveon Schoen [name] on 05/19/2021 via telephone.      I clearly identified myself as Gillis Santa, MD. I verified that I was speaking with the correct person using two identifiers (Name: Paul Tackitt [name], and date of birth:  03-Mar-1945). ? ?Consent ?I sought verbal advanced consent from [place]. for virtual visit interactions. I informed [name] of possible security and privacy concerns, risks, and limitations associated with providing "not-in-person" medical evaluation and management services. I also informed [name] of the availability of "in-person" appointments. Finally, I informed him that there would be a charge for the virtual visit and that he could be  personally, fully or partially, financially responsible for it. Mr. Hollibaugh expressed understanding and agreed to proceed.  ? ?Historic Elements   ?[name] is a 76 y.o. year old, male patient evaluated today after our last contact on 04/15/2021. [name]  has a past medical history of Acute prostatitis, BPH (benign prostatic hyperplasia), Chronic kidney disease, Dysplasia of prostate, Erectile dysfunction, HTN (hypertension), Hypogonadism in male, Orchitis and epididymitis, Over weight, Palindromic rheumatism, hand, Prostatitis, Rectum pain, Testicle swelling, Testicle tenderness, and Testicular mass. He also  has a past surgical history that includes Back surgery and head surgery. [name] has a current medication list which includes the following prescription(s): b-d single use swabs regular, amlodipine, aspirin-salicylamide-caffeine, true metrix level 1, true metrix meter, cyclobenzaprine, true metrix pro blood glucose, lancets 33g, levothyroxine, lisinopril-hydrochlorothiazide, metformin, omeprazole, oxycodone-acetaminophen, [START ON 06/06/2021] oxycodone-acetaminophen, and pregabalin. He  reports that he has quit smoking. He has never used smokeless tobacco. He reports that he does not drink alcohol and does not use drugs. [name] has no active allergies.  ? ?HPI  ?Today, he is being contacted for a post-procedure assessment. ? ? ?Post-procedure evaluation  ? Type: Lumbar epidural steroid injection (LESI)  (interlaminar) #1    ?Laterality: Midline   ?Level:  L5-S1 Level.  ?Imaging: Fluoroscopic guidance ?Anesthesia: Local anesthesia (1-2% Lidocaine) ?Anxiolysis: None                 ?  Sedation: None. ?DOS: 04/15/2021  ?Performed by: Gillis Santa, MD ? ?Purpose: Diagnostic/Therapeutic ?Indications: Lumbar radicular pain of intraspinal etiology of more than 4 weeks that has failed to respond to conservative therapy and is severe enough to impact quality of life or function. ?1. Lumbar radicular pain   ?2. Chronic radicular lumbar pain   ?3. Spinal stenosis, lumbar region, with neurogenic claudication   ?4. Chronic pain syndrome   ? ?NAS-11 Pain score:  ? Pre-procedure: 8 /10  ? Post-procedure: 4 /10  ? ?   ?Effectiveness:  ?Initial hour after procedure: 100 %  ?Subsequent 4-6 hours post-procedure: 100 %  ?Analgesia past initial 6 hours: 100 % (patient states that he had good pain relief for approx 1 week.  patient is concerned that his pain medication is not working with the amount he is allowed to take.  States he used to take 5 pain pills per day but is now reduced to 3 pills per day.)  ?Ongoing improvement:  ?Analgesic:  40-50% ?Function: Back to baseline ?ROM: Back to baseline ? ? ?Pharmacotherapy Assessment  ? ?Opioid Analgesic: Percocet 10 mg 3 times daily as needed, quantity 90/month  ? ?Monitoring: ?[place] PMP: PDMP reviewed during this encounter.       ?Pharmacotherapy: No side-effects or adverse reactions reported. ?Compliance: No problems identified. ?Effectiveness: Clinically acceptable. ?Plan: Refer to "POC". UDS:  ?Summary  ?Date Value Ref Range Status  ?10/28/2020 Note  Final  ?  Comment:  ?  ==================================================================== ?Compliance Drug Analysis, Ur ?==================================================================== ?Test                             Result       Flag       Units ? ?Drug Present not Declared for Prescription Verification ?  Oxycodone                       85           UNEXPECTED ng/mg creat ?  Oxymorphone                    256          UNEXPECTED ng/mg creat ?  Noroxycodone                   318          UNEXPECTED ng/mg creat ?  Noroxymorphone                 151          UNEXPECTED ng/mg creat ?   Sources of oxycodone are scheduled prescription medications. ?   Oxymorphone, noroxycodone, and noroxymorphone are expected ?   metabolites of oxycodone. Oxymorphone is also available as a ?   scheduled prescription medication. ? ?  Acetaminophen                  PRESENT      UNEXPECTED ?  Ibuprofen                      PRESENT      UNEXPECTED ? ?Drug Absent but Declared for Prescription Verification ?  Tramadol                       Not Detected UNEXPECTED ng/mg creat ?  Pregabalin  Not Detected UNEXPECTED ?  Cyclobenzaprine                Not Detected UNEXPECTED ?  Salicylate                     Not Detected UNEXPECTED ?   Salicylate, as indicated in the declared medication list, is not ?   always detected even when used as directed. ? ?   Aspirin, as indicated in the declared medication list, is not always ?   detected even when used as directed. ? ?==================================================================== ?Test                      Result    Flag   Units      Ref Range ?  Creatinine              72               mg/dL      >=20 ?==================================================================== ?Declared Medications: ? The flagging and interpretation on this report are based on the ? following declared medications.  Unexpected results may arise from ? inaccuracies in the declared medications. ? ? **Note: The testing scope of this panel includes these medications: ? ? Cyclobenzaprine (Flexeril) ? Pregabalin (Lyrica) ? Tramadol (Ultram) ? ? **Note: The testing scope of this panel does not include small to ? moderate amounts of these reported medications: ? ? Aspirin ? Salicylate (Salicylamide) ? ? **Note: The testing scope of this  panel does not include the ? following reported medications: ? ? Amlodipine (Norvasc) ? Caffeine ? Doxycycline ? Hydrochlorothiazide (Zestoretic) ? Levothyroxine (Synthroid) ? Lisinopril (Zestoretic) ? Metformin (Glu

## 2021-06-04 ENCOUNTER — Ambulatory Visit
Payer: No Typology Code available for payment source | Attending: Student in an Organized Health Care Education/Training Program | Admitting: Student in an Organized Health Care Education/Training Program

## 2021-06-04 ENCOUNTER — Encounter: Payer: Self-pay | Admitting: Student in an Organized Health Care Education/Training Program

## 2021-06-04 VITALS — BP 169/81 | HR 63 | Temp 97.3°F | Resp 16 | Ht 73.0 in | Wt 234.0 lb

## 2021-06-04 DIAGNOSIS — M51369 Other intervertebral disc degeneration, lumbar region without mention of lumbar back pain or lower extremity pain: Secondary | ICD-10-CM

## 2021-06-04 DIAGNOSIS — Z79891 Long term (current) use of opiate analgesic: Secondary | ICD-10-CM | POA: Insufficient documentation

## 2021-06-04 DIAGNOSIS — M5136 Other intervertebral disc degeneration, lumbar region: Secondary | ICD-10-CM | POA: Diagnosis not present

## 2021-06-04 DIAGNOSIS — G894 Chronic pain syndrome: Secondary | ICD-10-CM | POA: Diagnosis not present

## 2021-06-04 DIAGNOSIS — M48062 Spinal stenosis, lumbar region with neurogenic claudication: Secondary | ICD-10-CM | POA: Diagnosis not present

## 2021-06-04 DIAGNOSIS — G8929 Other chronic pain: Secondary | ICD-10-CM

## 2021-06-04 DIAGNOSIS — M5416 Radiculopathy, lumbar region: Secondary | ICD-10-CM

## 2021-06-04 MED ORDER — OXYCODONE-ACETAMINOPHEN 10-325 MG PO TABS: 1.0000 | ORAL_TABLET | Freq: Three times a day (TID) | ORAL | 0 refills | Status: DC | PRN

## 2021-06-04 NOTE — Progress Notes (Signed)
Nursing Pain Medication Assessment:  ?Safety precautions to be maintained throughout the outpatient stay will include: orient to surroundings, keep bed in low position, maintain call bell within reach at all times, provide assistance with transfer out of bed and ambulation.  ?Medication Inspection Compliance: Mr. Heide did not comply with our request to bring his pills to be counted. He was reminded that bringing the medication bottles, even when empty, is a requirement. ? ?Medication: None brought in. ?Pill/Patch Count: None available to be counted. ?Bottle Appearance: No container available. Did not bring bottle(s) to appointment. ?Filled Date: N/A ?Last Medication intake:  Today ?

## 2021-06-04 NOTE — Progress Notes (Signed)
PROVIDER NOTE: Information contained herein reflects review and annotations entered in association with encounter. Interpretation of such information and data should be left to medically-trained personnel. Information provided to patient can be located elsewhere in the medical record under "Patient Instructions". Document created using STT-dictation technology, any transcriptional errors that may result from process are unintentional.  ?  ?Patient: Paul Ramus [name]  Service Category: E/M  Provider: Gillis Santa, MD  ?DOB: [birth year]  DOS: 06/04/2021  Specialty: Interventional Pain Management  ?MRN: [id number]  Setting: Ambulatory outpatient  PCP: Cletis Athens, MD  ?Type: Established Patient    Referring Provider: Cletis Athens, MD  ?Location: Office  Delivery: Face-to-face    ? ?HPI  ?[name], a 76 y.o. year old male, is here today because of his Lumbar radicular pain [M54.16]. Paul Briggs primary complain today is Back Pain (lower) and Shoulder Pain (bilat) ?Last encounter: My last encounter with him was on 05/19/2021. ?Pertinent problems: Paul Briggs has Chronic back pain; Disorder of rotator cuff, left; Primary osteoarthritis of left shoulder; Left rotator cuff tear arthropathy; Spinal stenosis, lumbar region, with neurogenic claudication; Chronic left shoulder pain; Chronic pain syndrome; and Pain management contract signed on their pertinent problem list. ?Pain Assessment: Severity of Chronic pain is reported as a 5 /10. Location: Back Lower/radiates down legs bilat to feet. Onset: More than a month ago. Quality: Aching, Dull, Sharp. Timing: Constant. Modifying factor(s): meds. ?Vitals:  height is '6\' 1"'$  (1.854 m) and weight is 234 lb (106.1 kg). His temporal temperature is 97.3 ?F (36.3 ?C) (abnormal). His blood pressure is 169/81 (abnormal) and his pulse is 63. His respiration is 16 and oxygen saturation is 99%.  ? ?Reason for encounter: both, medication management and  post-procedure evaluation and assessment. ? ?Procedure ? Type: Lumbar epidural steroid injection (LESI) (interlaminar) #1    ?Laterality: Midline   ?Level:  L5-S1 Level.  ?Imaging: Fluoroscopic guidance ?Anesthesia: Local anesthesia (1-2% Lidocaine) ?Anxiolysis: None                 ?Sedation: None. ?DOS: 04/15/2021  ?Performed by: Gillis Santa, MD ?  ?Purpose: Diagnostic/Therapeutic ?Indications: Lumbar radicular pain of intraspinal etiology of more than 4 weeks that has failed to respond to conservative therapy and is severe enough to impact quality of life or function. ?1. Lumbar radicular pain   ?2. Chronic radicular lumbar pain   ?3. Spinal stenosis, lumbar region, with neurogenic claudication   ?4. Chronic pain syndrome   ?  ?NAS-11 Pain score:  ?            Pre-procedure: 8 /10  ?            Post-procedure: 4 /10  ?  ?   ?Effectiveness:  ?Initial hour after procedure: 100 %  ?Subsequent 4-6 hours post-procedure: 100 %  ?Analgesia past initial 6 hours: 100 % for 2 weeks then gradually reduced ?Ongoing improvement:  ?Analgesic:  <25% ?Function: Back to baseline ?ROM: Back to baseline ? ?Pharmacotherapy Assessment  ?Analgesic: Percocet 10 mg 3 times daily as needed, quantity 90/month  ? ?Monitoring: ?[place] PMP: PDMP reviewed during this encounter.       ?Pharmacotherapy: No side-effects or adverse reactions reported. ?Compliance: No problems identified. ?Effectiveness: Clinically acceptable. ? ?[name], RN  06/04/2021 10:17 AM  Sign when Signing Visit ?Nursing Pain Medication Assessment:  ?Safety precautions to be maintained throughout the outpatient stay will include: orient to surroundings, keep bed in low position, maintain call bell within reach  at all times, provide assistance with transfer out of bed and ambulation.  ?Medication Inspection Compliance: Paul Briggs did not comply with our request to bring his pills to be counted. He was reminded that bringing the medication bottles, even when empty, is  a requirement. ? ?Medication: None brought in. ?Pill/Patch Count: None available to be counted. ?Bottle Appearance: No container available. Did not bring bottle(s) to appointment. ?Filled Date: N/A ?Last Medication intake:  Today ?   UDS:  ?Summary  ?Date Value Ref Range Status  ?10/28/2020 Note  Final  ?  Comment:  ?  ==================================================================== ?Compliance Drug Analysis, Ur ?==================================================================== ?Test                             Result       Flag       Units ? ?Drug Present not Declared for Prescription Verification ?  Oxycodone                      85           UNEXPECTED ng/mg creat ?  Oxymorphone                    256          UNEXPECTED ng/mg creat ?  Noroxycodone                   318          UNEXPECTED ng/mg creat ?  Noroxymorphone                 151          UNEXPECTED ng/mg creat ?   Sources of oxycodone are scheduled prescription medications. ?   Oxymorphone, noroxycodone, and noroxymorphone are expected ?   metabolites of oxycodone. Oxymorphone is also available as a ?   scheduled prescription medication. ? ?  Acetaminophen                  PRESENT      UNEXPECTED ?  Ibuprofen                      PRESENT      UNEXPECTED ? ?Drug Absent but Declared for Prescription Verification ?  Tramadol                       Not Detected UNEXPECTED ng/mg creat ?  Pregabalin                     Not Detected UNEXPECTED ?  Cyclobenzaprine                Not Detected UNEXPECTED ?  Salicylate                     Not Detected UNEXPECTED ?   Salicylate, as indicated in the declared medication list, is not ?   always detected even when used as directed. ? ?   Aspirin, as indicated in the declared medication list, is not always ?   detected even when used as directed. ? ?==================================================================== ?Test                      Result    Flag   Units      Ref Range ?  Creatinine  72                mg/dL      >=20 ?==================================================================== ?Declared Medications: ? The flagging and interpretation on this report are based on the ? following declared medications.  Unexpected results may arise from ? inaccuracies in the declared medications. ? ? **Note: The testing scope of this panel includes these medications: ? ? Cyclobenzaprine (Flexeril) ? Pregabalin (Lyrica) ? Tramadol (Ultram) ? ? **Note: The testing scope of this panel does not include small to ? moderate amounts of these reported medications: ? ? Aspirin ? Salicylate (Salicylamide) ? ? **Note: The testing scope of this panel does not include the ? following reported medications: ? ? Amlodipine (Norvasc) ? Caffeine ? Doxycycline ? Hydrochlorothiazide (Zestoretic) ? Levothyroxine (Synthroid) ? Lisinopril (Zestoretic) ? Metformin (Glucophage) ? Omeprazole (Prilosec) ? Rosuvastatin (Crestor) ? Tadalafil (Cialis) ? Tamsulosin (Flomax) ? Topical ?==================================================================== ?For clinical consultation, please call [phone]. ?==================================================================== ?  ?  ? ?ROS  ?Constitutional: Denies any fever or chills ?Gastrointestinal: No reported hemesis, hematochezia, vomiting, or acute GI distress ?Musculoskeletal:  low back and bilateral leg pain ?Neurological: No reported episodes of acute onset apraxia, aphasia, dysarthria, agnosia, amnesia, paralysis, loss of coordination, or loss of consciousness ? ?Medication Review  ?Aspirin-Salicylamide-Caffeine, B-D SINGLE USE SWABS REGULAR, Lancets 33G, True Metrix Level 1, True Metrix Meter, amLODipine, cyclobenzaprine, glucose blood, levothyroxine, lisinopril-hydrochlorothiazide, metFORMIN, omeprazole, oxyCODONE-acetaminophen, and pregabalin ? ?History Review  ?Allergy: Paul Briggs has no active allergies. ?Drug: Paul Briggs  reports no history of drug use. ?Alcohol:  reports no  history of alcohol use. ?Tobacco:  reports that he has quit smoking. He has never used smokeless tobacco. ?Social: Mr. Mullendore  reports that he has quit smoking. He has never used smokeless tobacco. He rep

## 2021-06-04 NOTE — Patient Instructions (Signed)
Epidural Steroid Injection Patient Information  Description: The epidural space surrounds the nerves as they exit the spinal cord.  In some patients, the nerves can be compressed and inflamed by a bulging disc or a tight spinal canal (spinal stenosis).  By injecting steroids into the epidural space, we can bring irritated nerves into direct contact with a potentially helpful medication.  These steroids act directly on the irritated nerves and can reduce swelling and inflammation which often leads to decreased pain.  Epidural steroids may be injected anywhere along the spine and from the neck to the low back depending upon the location of your pain.   After numbing the skin with local anesthetic (like Novocaine), a small needle is passed into the epidural space slowly.  You may experience a sensation of pressure while this is being done.  The entire block usually last less than 10 minutes.  Conditions which may be treated by epidural steroids:  Low back and leg pain Neck and arm pain Spinal stenosis Post-laminectomy syndrome Herpes zoster (shingles) pain Pain from compression fractures  Preparation for the injection:  Do not eat any solid food or dairy products within 8 hours of your appointment.  You may drink clear liquids up to 3 hours before appointment.  Clear liquids include water, black coffee, juice or soda.  No milk or cream please. You may take your regular medication, including pain medications, with a sip of water before your appointment  Diabetics should hold regular insulin (if taken separately) and take 1/2 normal NPH dos the morning of the procedure.  Carry some sugar containing items with you to your appointment. A driver must accompany you and be prepared to drive you home after your procedure.  Bring all your current medications with your. An IV may be inserted and sedation may be given at the discretion of the physician.   A blood pressure cuff, EKG and other monitors will  often be applied during the procedure.  Some patients may need to have extra oxygen administered for a short period. You will be asked to provide medical information, including your allergies, prior to the procedure.  We must know immediately if you are taking blood thinners (like Coumadin/Warfarin)  Or if you are allergic to IV iodine contrast (dye). We must know if you could possible be pregnant.  Possible side-effects: Bleeding from needle site Infection (rare, may require surgery) Nerve injury (rare) Numbness & tingling (temporary) Difficulty urinating (rare, temporary) Spinal headache ( a headache worse with upright posture) Light -headedness (temporary) Pain at injection site (several days) Decreased blood pressure (temporary) Weakness in arm/leg (temporary) Pressure sensation in back/neck (temporary)  Call if you experience: Fever/chills associated with headache or increased back/neck pain. Headache worsened by an upright position. New onset weakness or numbness of an extremity below the injection site Hives or difficulty breathing (go to the emergency room) Inflammation or drainage at the infection site Severe back/neck pain Any new symptoms which are concerning to you  Please note:  Although the local anesthetic injected can often make your back or neck feel good for several hours after the injection, the pain will likely return.  It takes 3-7 days for steroids to work in the epidural space.  You may not notice any pain relief for at least that one week.  If effective, we will often do a series of three injections spaced 3-6 weeks apart to maximally decrease your pain.  After the initial series, we generally will wait several months before   considering a repeat injection of the same type.  If you have any questions, please call [phone] [facility] 

## 2021-06-05 ENCOUNTER — Telehealth: Payer: Self-pay | Admitting: Student in an Organized Health Care Education/Training Program

## 2021-06-05 ENCOUNTER — Other Ambulatory Visit: Payer: Self-pay | Admitting: *Deleted

## 2021-06-05 DIAGNOSIS — M5416 Radiculopathy, lumbar region: Secondary | ICD-10-CM

## 2021-06-05 DIAGNOSIS — M48062 Spinal stenosis, lumbar region with neurogenic claudication: Secondary | ICD-10-CM

## 2021-06-05 DIAGNOSIS — G894 Chronic pain syndrome: Secondary | ICD-10-CM

## 2021-06-05 DIAGNOSIS — Z79891 Long term (current) use of opiate analgesic: Secondary | ICD-10-CM

## 2021-06-05 MED ORDER — OXYCODONE-ACETAMINOPHEN 10-325 MG PO TABS: 1.0000 | ORAL_TABLET | Freq: Three times a day (TID) | ORAL | 0 refills | Status: DC | PRN

## 2021-06-05 NOTE — Telephone Encounter (Signed)
Patient stated he went to pick meds up on yesterday and meds wasn't ready for pickup.  Can you please give patient a call. Thanks ?

## 2021-06-05 NOTE — Telephone Encounter (Signed)
Called to [facility] and they report that patient last filled on 05/13/21 and will be able to fill again on 06/11/21 and there is an Rx available to be filled at that time. ? ?Called to Mr Melone and he is out of medication and states he really needs additional pain medication and does not understand why BL will not let him have it.  States that he hurts so bad that he has to take additional pain medication.  Explained that the medication is written for q8hrs prn pain and that is how he should take it.  If he feels he needs additional medicine at times, he still has to make the Rx last for 30 days.  Patient went on and on about needing more medication.  I told him that I would include BL in our conversation today for review on Monday.   ? ?Meanwhile, [facility] called back and as she was flagging  Rx for DNF 06/11/21 she accidentally deleted Rx.  She asked if we could resend the May Rx with a date of 06/11/21.  I told her I would as BL if he will do this on Monday when he is back in office.  ? ?Medication refill request sent.  ?

## 2021-06-12 ENCOUNTER — Other Ambulatory Visit: Payer: Self-pay | Admitting: Internal Medicine

## 2021-06-12 DIAGNOSIS — E119 Type 2 diabetes mellitus without complications: Secondary | ICD-10-CM

## 2021-06-15 ENCOUNTER — Encounter: Payer: Self-pay | Admitting: Student in an Organized Health Care Education/Training Program

## 2021-06-15 ENCOUNTER — Ambulatory Visit (HOSPITAL_BASED_OUTPATIENT_CLINIC_OR_DEPARTMENT_OTHER)
Payer: No Typology Code available for payment source | Admitting: Student in an Organized Health Care Education/Training Program

## 2021-06-15 ENCOUNTER — Ambulatory Visit
Admission: RE | Admit: 2021-06-15 | Discharge: 2021-06-15 | Disposition: A | Discharge status: Home / Self Care | Payer: No Typology Code available for payment source | Source: Ambulatory Visit | Attending: Student in an Organized Health Care Education/Training Program | Admitting: Student in an Organized Health Care Education/Training Program

## 2021-06-15 ENCOUNTER — Other Ambulatory Visit: Payer: Self-pay

## 2021-06-15 DIAGNOSIS — G8929 Other chronic pain: Secondary | ICD-10-CM | POA: Insufficient documentation

## 2021-06-15 DIAGNOSIS — M5416 Radiculopathy, lumbar region: Secondary | ICD-10-CM

## 2021-06-15 DIAGNOSIS — M48062 Spinal stenosis, lumbar region with neurogenic claudication: Secondary | ICD-10-CM | POA: Diagnosis not present

## 2021-06-15 DIAGNOSIS — G894 Chronic pain syndrome: Secondary | ICD-10-CM | POA: Diagnosis not present

## 2021-06-15 MED ORDER — ROPIVACAINE HCL 2 MG/ML IJ SOLN
2.0000 mL | Freq: Once | INTRAMUSCULAR | Status: AC
  Administered 2021-06-15: 20 mL via EPIDURAL
  Filled 2021-06-15: qty 20

## 2021-06-15 MED ORDER — IOHEXOL 180 MG/ML  SOLN
10.0000 mL | Freq: Once | INTRAMUSCULAR | Status: AC
  Administered 2021-06-15: 10 mL via EPIDURAL
  Filled 2021-06-15: qty 20

## 2021-06-15 MED ORDER — DEXAMETHASONE SODIUM PHOSPHATE 10 MG/ML IJ SOLN
10.0000 mg | Freq: Once | INTRAMUSCULAR | Status: AC
  Administered 2021-06-15: 10 mg
  Filled 2021-06-15: qty 1

## 2021-06-15 MED ORDER — LIDOCAINE HCL 2 % IJ SOLN
20.0000 mL | Freq: Once | INTRAMUSCULAR | Status: AC
  Administered 2021-06-15: 400 mg
  Filled 2021-06-15: qty 20

## 2021-06-15 MED ORDER — SODIUM CHLORIDE 0.9% FLUSH
2.0000 mL | Freq: Once | INTRAVENOUS | Status: AC
  Administered 2021-06-15: 10 mL

## 2021-06-15 NOTE — Patient Instructions (Signed)

## 2021-06-15 NOTE — Progress Notes (Signed)
Safety precautions to be maintained throughout the outpatient stay will include: orient to surroundings, keep bed in low position, maintain call bell within reach at all times, provide assistance with transfer out of bed and ambulation.  

## 2021-06-15 NOTE — Progress Notes (Signed)
PROVIDER NOTE: Interpretation of information contained herein should be left to medically-trained personnel. Specific patient instructions are provided elsewhere under "Patient Instructions" section of medical record. This document was created in part using STT-dictation technology, any transcriptional errors that may result from this process are unintentional.  Patient: Paul Lord [name] Type: Established DOB: Dec 03, 1945 MRN: [id number] PCP: Cletis Athens, MD  Service: Procedure DOS: 06/15/2021 Setting: Ambulatory Location: Ambulatory outpatient facility Delivery: Face-to-face Provider: Gillis Santa, MD Specialty: Interventional Pain Management Specialty designation: 09 Location: Outpatient facility Ref. Prov.: Gillis Santa, MD    Primary Reason for Visit: Interventional Pain Management Treatment. CC: Back Pain (Lumbar bilateral )   Procedure:           Type: Lumbar epidural steroid injection (LESI) (interlaminar) #2    Laterality: Midline   Level:  L5-S1 Level.  Imaging: Fluoroscopic guidance Anesthesia: Local anesthesia (1-2% Lidocaine) Anxiolysis: None                 Sedation: None. DOS: 06/15/2021  Performed by: Gillis Santa, MD  Purpose: Diagnostic/Therapeutic Indications: Lumbar radicular pain of intraspinal etiology of more than 4 weeks that has failed to respond to conservative therapy and is severe enough to impact quality of life or function. 1. Lumbar radicular pain   2. Spinal stenosis, lumbar region, with neurogenic claudication   3. Chronic radicular lumbar pain   4. Chronic pain syndrome    NAS-11 Pain score:   Pre-procedure: 7 /10   Post-procedure: 7 /10      Position / Prep / Materials:  Position: Prone w/ head of the table raised (slight reverse trendelenburg) to facilitate breathing.  Prep solution: DuraPrep (Iodine Povacrylex [0.7% available iodine] and Isopropyl Alcohol, 74% w/w) Prep Area: Entire Posterior Lumbar Region from lower scapular tip  down to mid buttocks area and from flank to flank. Materials:  Tray: Epidural tray Needle(s):  Type: Epidural needle          Gauge (G):  17 Length: Regular (3.5-in) Qty: 1  Pre-op H&P Assessment:  Mr. Lyerly is a 76 y.o. (year old), male patient, seen today for interventional treatment. He  has a past surgical history that includes Back surgery and head surgery. Mr. Rho has a current medication list which includes the following prescription(s): b-d single use swabs regular, amlodipine, aspirin-salicylamide-caffeine, true metrix level 1, true metrix meter, cyclobenzaprine, true metrix pro blood glucose, lancets 33g, levothyroxine, lisinopril-hydrochlorothiazide, metformin, omeprazole, [START ON 08/11/2021] oxycodone-acetaminophen, [START ON 07/12/2021] oxycodone-acetaminophen, and pregabalin. His primarily concern today is the Back Pain (Lumbar bilateral )  Initial Vital Signs:  Pulse/HCG Rate:  (!) 57 ECG Heart Rate: (!) 49 Temp:  (!) 97.3 F (36.3 C) Resp: 16 BP:  (!) 180/85 SpO2: 100 %  BMI: Estimated body mass index is 30.87 kg/m as calculated from the following:   Height as of this encounter: '6\' 1"'$  (1.854 m).   Weight as of this encounter: 234 lb (106.1 kg).  Risk Assessment: Allergies: Reviewed. He has No Known Allergies.  Allergy Precautions: None required Coagulopathies: Reviewed. None identified.  Blood-thinner therapy: None at this time Active Infection(s): Reviewed. None identified. Mr. Faulk is afebrile  Site Confirmation: Mr. Cerone was asked to confirm the procedure and laterality before marking the site Procedure checklist: Completed Consent: Before the procedure and under the influence of no sedative(s), amnesic(s), or anxiolytics, the patient was informed of the treatment options, risks and possible complications. To fulfill our ethical and legal obligations, as recommended by the [employer]'s  Code of Ethics, I have informed the  patient of my clinical impression; the nature and purpose of the treatment or procedure; the risks, benefits, and possible complications of the intervention; the alternatives, including doing nothing; the risk(s) and benefit(s) of the alternative treatment(s) or procedure(s); and the risk(s) and benefit(s) of doing nothing. The patient was provided information about the general risks and possible complications associated with the procedure. These may include, but are not limited to: failure to achieve desired goals, infection, bleeding, organ or nerve damage, allergic reactions, paralysis, and death. In addition, the patient was informed of those risks and complications associated to Spine-related procedures, such as failure to decrease pain; infection (i.e.: Meningitis, epidural or intraspinal abscess); bleeding (i.e.: epidural hematoma, subarachnoid hemorrhage, or any other type of intraspinal or peri-dural bleeding); organ or nerve damage (i.e.: Any type of peripheral nerve, nerve root, or spinal cord injury) with subsequent damage to sensory, motor, and/or autonomic systems, resulting in permanent pain, numbness, and/or weakness of one or several areas of the body; allergic reactions; (i.e.: anaphylactic reaction); and/or death. Furthermore, the patient was informed of those risks and complications associated with the medications. These include, but are not limited to: allergic reactions (i.e.: anaphylactic or anaphylactoid reaction(s)); adrenal axis suppression; blood sugar elevation that in diabetics may result in ketoacidosis or comma; water retention that in patients with history of congestive heart failure may result in shortness of breath, pulmonary edema, and decompensation with resultant heart failure; weight gain; swelling or edema; medication-induced neural toxicity; particulate matter embolism and blood vessel occlusion with resultant organ, and/or nervous system infarction; and/or aseptic necrosis  of one or more joints. Finally, the patient was informed that Medicine is not an exact science; therefore, there is also the possibility of unforeseen or unpredictable risks and/or possible complications that may result in a catastrophic outcome. The patient indicated having understood very clearly. We have given the patient no guarantees and we have made no promises. Enough time was given to the patient to ask questions, all of which were answered to the patient's satisfaction. Mr. Zion has indicated that he wanted to continue with the procedure. Attestation: I, the ordering provider, attest that I have discussed with the patient the benefits, risks, side-effects, alternatives, likelihood of achieving goals, and potential problems during recovery for the procedure that I have provided informed consent. Date  Time: 06/15/2021  8:49 AM  Pre-Procedure Preparation:  Monitoring: As per clinic protocol. Respiration, ETCO2, SpO2, BP, heart rate and rhythm monitor placed and checked for adequate function Safety Precautions: Patient was assessed for positional comfort and pressure points before starting the procedure. Time-out: I initiated and conducted the "Time-out" before starting the procedure, as per protocol. The patient was asked to participate by confirming the accuracy of the "Time Out" information. Verification of the correct person, site, and procedure were performed and confirmed by me, the nursing staff, and the patient. "Time-out" conducted as per Joint Commission's Universal Protocol (UP.01.01.01). Time: 0948  Description/Narrative of Procedure:          Target: Epidural space via interlaminar opening, initially targeting the lower laminar border of the superior vertebral body. Region: Lumbar Approach: Percutaneous paravertebral  Rationale (medical necessity): procedure needed and proper for the diagnosis and/or treatment of the patient's medical symptoms and needs. Procedural Technique  Safety Precautions: Aspiration looking for blood return was conducted prior to all injections. At no point did we inject any substances, as a needle was being advanced. No attempts were made at seeking  any paresthesias. Safe injection practices and needle disposal techniques used. Medications properly checked for expiration dates. SDV (single dose vial) medications used. Description of the Procedure: Protocol guidelines were followed. The procedure needle was introduced through the skin, ipsilateral to the reported pain, and advanced to the target area. Bone was contacted and the needle walked caudad, until the lamina was cleared. The epidural space was identified using "loss-of-resistance technique" with 2-3 ml of PF-NaCl (0.9% NSS), in a 5cc LOR glass syringe.   7 cc solution made of 3 cc of preservative-free saline, 3 cc of 0.2% ropivacaine, 1 cc of Decadron 10 mg/cc.   Vitals:   06/15/21 0906 06/15/21 0951 06/15/21 0953  BP: (!) 180/85 (!) 178/110 (!) 179/88  Pulse: (!) 57    Resp: '16 11 12  '$ Temp: (!) 97.3 F (36.3 C)    TempSrc: Temporal    SpO2: 100% 97% 100%  Weight: 234 lb (106.1 kg)    Height: '6\' 1"'$  (1.854 m)       Start Time: 0948 hrs. End Time: 0953 hrs.  Imaging Guidance (Spinal):          Type of Imaging Technique: Fluoroscopy Guidance (Spinal) Indication(s): Assistance in needle guidance and placement for procedures requiring needle placement in or near specific anatomical locations not easily accessible without such assistance. Exposure Time: Please see nurses notes. Contrast: Before injecting any contrast, we confirmed that the patient did not have an allergy to iodine, shellfish, or radiological contrast. Once satisfactory needle placement was completed at the desired level, radiological contrast was injected. Contrast injected under live fluoroscopy. No contrast complications. See chart for type and volume of contrast used. Fluoroscopic Guidance: I was personally  present during the use of fluoroscopy. "Tunnel Vision Technique" used to obtain the best possible view of the target area. Parallax error corrected before commencing the procedure. "Direction-depth-direction" technique used to introduce the needle under continuous pulsed fluoroscopy. Once target was reached, antero-posterior, oblique, and lateral fluoroscopic projection used confirm needle placement in all planes. Images permanently stored in EMR. Interpretation: I personally interpreted the imaging intraoperatively. Adequate needle placement confirmed in multiple planes. Appropriate spread of contrast into desired area was observed. No evidence of afferent or efferent intravascular uptake. No intrathecal or subarachnoid spread observed. Permanent images saved into the patient's record.   Post-operative Assessment:  Post-procedure Vital Signs:  Pulse/HCG Rate:  (!) 57(!) 51 Temp:  (!) 97.3 F (36.3 C) Resp: 12 BP:  (!) 179/88 SpO2: 100 %  EBL: None  Complications: No immediate post-treatment complications observed by team, or reported by patient.  Note: The patient tolerated the entire procedure well. A repeat set of vitals were taken after the procedure and the patient was kept under observation following institutional policy, for this type of procedure. Post-procedural neurological assessment was performed, showing return to baseline, prior to discharge. The patient was provided with post-procedure discharge instructions, including a section on how to identify potential problems. Should any problems arise concerning this procedure, the patient was given instructions to immediately contact us, at any time, without hesitation. In any case, we plan to contact the patient by telephone for a follow-up status report regarding this interventional procedure.  5 out of 5 strength bilateral lower extremity: Plantar flexion, dorsiflexion, knee flexion, knee extension.   Comments:  No additional  relevant information.  Plan of Care  Orders:  Orders Placed This Encounter  Procedures   DG PAIN CLIN[facility] C-ARM 1-60 MIN NO REPORT    Intraoperative interpretation by  procedural physician at [place].    Standing Status:   Standing    Number of Occurrences:   1    Order Specific Question:   Reason for exam:    Answer:   Assistance in needle guidance and placement for procedures requiring needle placement in or near specific anatomical locations not easily accessible without such assistance.   Chronic Opioid Analgesic:  Percocet 10 mg 3 times daily as needed, quantity 90/month   Medications ordered for procedure: Meds ordered this encounter  Medications   iohexol (OMNIPAQUE) 180 MG/ML injection 10 mL    Must be Myelogram-compatible. If not available, you may substitute with a water-soluble, non-ionic, hypoallergenic, myelogram-compatible radiological contrast medium.   lidocaine (XYLOCAINE) 2 % (with pres) injection 400 mg   sodium chloride flush (NS) 0.9 % injection 2 mL   ropivacaine (PF) 2 mg/mL (0.2%) (NAROPIN) injection 2 mL   dexamethasone (DECADRON) injection 10 mg   Medications administered: We administered iohexol, lidocaine, sodium chloride flush, ropivacaine (PF) 2 mg/mL (0.2%), and dexamethasone.  See the medical record for exact dosing, route, and time of administration.  Follow-up plan:   Return in about 6 weeks (around 07/27/2021) for Post Procedure Evaluation, virtual.       Left shoulder injection 11/10/20, 12/24/20. L5/S1 ESI 04/15/21, 06/15/21    Recent Visits Date Type Provider Dept  06/04/21 Office Visit Gillis Santa, MD [facility]  05/19/21 Office Visit Gillis Santa, MD [facility]  04/15/21 Procedure visit Gillis Santa, MD [facility]  04/07/21 Office Visit Gillis Santa, MD [facility]  Showing recent visits within past 90 days and meeting all other requirements Today's Visits Date Type Provider Dept   06/15/21 Procedure visit Gillis Santa, MD [facility]  Showing today's visits and meeting all other requirements Future Appointments Date Type Provider Dept  07/30/21 Appointment Gillis Santa, MD [facility]  09/03/21 Appointment Gillis Santa, MD [facility]  Showing future appointments within next 90 days and meeting all other requirements  Disposition: Discharge home  Discharge (Date  Time): 06/15/2021; 1000 hrs.   Primary Care Physician: Cletis Athens, MD Location: [facility] Note by: Gillis Santa, MD Date: 06/15/2021; Time: 10:02 AM  Disclaimer:  Medicine is not an exact science. The only guarantee in medicine is that nothing is guaranteed. It is important to note that the decision to proceed with this intervention was based on the information collected from the patient. The Data and conclusions were drawn from the patient's questionnaire, the interview, and the physical examination. Because the information was provided in large part by the patient, it cannot be guaranteed that it has not been purposely or unconsciously manipulated. Every effort has been made to obtain as much relevant data as possible for this evaluation. It is important to note that the conclusions that lead to this procedure are derived in large part from the available data. Always take into account that the treatment will also be dependent on availability of resources and existing treatment guidelines, considered by other Pain Management Practitioners as being common knowledge and practice, at the time of the intervention. For Medico-Legal purposes, it is also important to point out that variation in procedural techniques and pharmacological choices are the acceptable norm. The indications, contraindications, technique, and results of the above procedure should only be interpreted and judged by a Board-Certified Interventional Pain Specialist with extensive  familiarity and expertise in the same exact procedure and technique.

## 2021-06-16 ENCOUNTER — Telehealth: Payer: Self-pay

## 2021-06-16 NOTE — Telephone Encounter (Signed)
Post procedure phone call. Patient states he is doing well.  

## 2021-06-18 ENCOUNTER — Ambulatory Visit (INDEPENDENT_AMBULATORY_CARE_PROVIDER_SITE_OTHER): Payer: No Typology Code available for payment source | Admitting: Urology

## 2021-06-18 ENCOUNTER — Encounter: Payer: Self-pay | Admitting: Urology

## 2021-06-18 VITALS — BP 182/86 | HR 54 | Ht 73.0 in | Wt 234.0 lb

## 2021-06-18 DIAGNOSIS — N529 Male erectile dysfunction, unspecified: Secondary | ICD-10-CM

## 2021-06-18 NOTE — Patient Instructions (Signed)
[facility] [place] [facility] [address]  [place], [place] [address] [phone]  [facility] [address], [address] [place],  [address] [phone]  [facility] [address], [address] [place], [place] [address] [phone]   

## 2021-06-18 NOTE — Progress Notes (Signed)
06/18/21 10:01 AM   Paul Ramus [name] 1945-11-21 [id number]  Referring provider:  Cletis Athens, MD [address] [place],  [place] [address]  Urological history  1. ED -contributing factors of age, BPH, spinal injury, brain injury, DM, HTN, HLD and sleep apnea -SHIM 11 -PDE5i's intolerable    2. BPH with LU TS -PSA 0.5 in 09/2020  -I PSS 8/3 -managed with tamsulosin 0.4 mg   3. Family history of prostate cancer -maternal uncle with fatal prostate cancer  Chief Complaint  Patient presents with   Benign Prostatic Hypertrophy   Follow-up     HPI: Paul Debruin [name] is a 76 y.o.male who presents today for discussion of medication.   He has checked backslash flank pain, lower abdominal pain, genital swelling and erection problems on his review of system form.  He is still having issues with a split stream unless he has an erection.  He states he has a strong stream for days after an erections.  He is no longer taking the tamsulosin.  Patient denies any modifying or aggravating factors.  Patient denies any gross hematuria, dysuria or suprapubic/flank pain.  Patient denies any fevers, chills, nausea or vomiting.     IPSS     Row Name 06/18/21 0900         International Prostate Symptom Score   How often have you had the sensation of not emptying your bladder? Less than half the time     How often have you had to urinate less than every two hours? Less than 1 in 5 times     How often have you found you stopped and started again several times when you urinated? Not at All     How often have you found it difficult to postpone urination? Not at All     How often have you had a weak urinary stream? About half the time     How often have you had to strain to start urination? Not at All     How many times did you typically get up at night to urinate? 2 Times     Total IPSS Score 8       Quality of Life due to urinary symptoms   If you were to spend the rest of your  life with your urinary condition just the way it is now how would you feel about that? Mixed              Score:  1-7 Mild 8-19 Moderate 20-35 Severe   Patient is not having spontaneous erections.  He denies any pain or curvature with erections.  PDE5i's not effective.      SHIM     Row Name 06/18/21 [phone]         SHIM: Over the last 6 months:   How do you rate your confidence that you could get and keep an erection? Low     When you had erections with sexual stimulation, how often were your erections hard enough for penetration (entering your partner)? A Few Times (much less than half the time)     During sexual intercourse, how often were you able to maintain your erection after you had penetrated (entered) your partner? A Few Times (much less than half the time)     During sexual intercourse, how difficult was it to maintain your erection to completion of intercourse? Difficult     When you attempted sexual intercourse, how often was it satisfactory for you?  A Few Times (much less than half the time)       SHIM Total Score   SHIM 11              PMH: Past Medical History:  Diagnosis Date   Acute prostatitis    BPH (benign prostatic hyperplasia)    Chronic kidney disease    Dysplasia of prostate    Erectile dysfunction    HTN (hypertension)    Hypogonadism in male    Orchitis and epididymitis    Over weight    Palindromic rheumatism, hand    Prostatitis    Rectum pain    Testicle swelling    Testicle tenderness    Testicular mass     Surgical History: Past Surgical History:  Procedure Laterality Date   BACK SURGERY     head surgery     from fall  drilled hole in brain to relieve pressure    Home Medications:  Allergies as of 06/18/2021   No Known Allergies      Medication List        Accurate as of Jun 18, 2021 10:01 AM. If you have any questions, ask your nurse or doctor.          amLODipine 10 MG tablet Commonly known as:  NORVASC Take 1 tablet (10 mg total) by mouth daily.   ARTHRITIS STRENGTH BC POWDER PO Take by mouth.   B-D SINGLE USE SWABS REGULAR Pads 100 each by Does not apply route daily.   cyclobenzaprine 5 MG tablet Commonly known as: FLEXERIL Take 1 tablet (5 mg total) by mouth 3 (three) times daily as needed for muscle spasms.   Lancets 33G Misc Check blood sugar daily   levothyroxine 175 MCG tablet Commonly known as: SYNTHROID Take 1 tablet (175 mcg total) by mouth daily.   lisinopril-hydrochlorothiazide 20-12.5 MG tablet Commonly known as: ZESTORETIC TAKE 1 TABLET EVERY DAY   metFORMIN 500 MG tablet Commonly known as: GLUCOPHAGE TAKE 1 TABLET BY MOUTH TWICE DAILY WITH A MEAL   omeprazole 20 MG capsule Commonly known as: PRILOSEC TAKE 1 CAPSULE EVERY DAY   oxyCODONE-acetaminophen 10-325 MG tablet Commonly known as: Percocet Take 1 tablet by mouth every 8 (eight) hours as needed for pain. Must last 30 days. Start taking on: July 12, 2021   oxyCODONE-acetaminophen 10-325 MG tablet Commonly known as: Percocet Take 1 tablet by mouth every 8 (eight) hours as needed for pain. Must last 30 days. Start taking on: August 11, 2021   pregabalin 50 MG capsule Commonly known as: Lyrica Take 1 capsule (50 mg total) by mouth at bedtime for 15 days, THEN 1 capsule (50 mg total) 2 (two) times daily. Start taking on: May 19, 2021 What changed: See the new instructions.   True Metrix Level 1 Low Soln 1 Bottle by In Vitro route daily.   True Metrix Meter Devi Check blood sugar daily   True Metrix Pro Blood Glucose test strip Generic drug: glucose blood Use as instructed        Allergies:  No Known Allergies  Family History: Family History  Problem Relation Age of Onset   Cancer Maternal Aunt    Prostate cancer Neg Hx    Kidney disease Neg Hx    Kidney cancer Neg Hx    Bladder Cancer Neg Hx     Social History:  reports that he has quit smoking. He has never used  smokeless tobacco. He reports that he does not drink alcohol  and does not use drugs.   Physical Exam: BP (!) 182/86   Pulse (!) 54   Ht '6\' 1"'$  (1.854 m)   Wt 234 lb (106.1 kg)   BMI 30.87 kg/m   Constitutional:  Well nourished. Alert and oriented, No acute distress. HEENT: [place] AT, moist mucus membranes.  Trachea midline Cardiovascular: No clubbing, cyanosis, or edema. Respiratory: Normal respiratory effort, no increased work of breathing. Neurologic: Grossly intact, no focal deficits, moving all 4 extremities. Psychiatric: Normal mood and affect.   Laboratory Data: Lab Results  Component Value Date   HGBA1C 6.8 03/10/2021   Component     Latest Ref Rng 10/02/2020  Prostate Specific Ag, Serum     0.0 - 4.0 ng/mL 0.5   I have reviewed the labs.   Pertinent Imaging: N/A  Assessment & Plan:    1. ED -failed PDE5i's  -I discussed with the patient how intracavernosal injections are administered -I explained that our procedure is to schedule a Trimix test dosing, which is a morning appointment, in which he presents to the office with his Trimix medication and we will slowly titrate the medication in office until he achieves an adequate erection -It is important that he does not have elevated blood pressure at this visit as we will not be allowed to initiate the titration for the risk of priapism and the treatment for priapism will elevated BP -I explained that [facility], which [facility] in [place], and the medication does have an expiration date so it is important that he schedule his appointment within that time. -At this time he has uncontrolled blood pressure, so I have given him a list of PCPs in the area that are accepting new patients as he stated he would like to see a different provider -Once he is seen and evaluated and gets his blood pressure under better control, I have instructed him to call our office to schedule a Trimix titration     Return if symptoms worsen or fail to improve.  [facility] [address], [place], [place] [address] [phone]  I, Kirke Shaggy Littlejohn,acting as a scribe for [facility], PA-C.,have documented all relevant documentation on the behalf of Paul Moga, PA-C,as directed by  [facility], PA-C while in the presence of [place], PA-C.  I have reviewed the above documentation for accuracy and completeness, and I agree with the above.    Zara Council, PA-C   I spent 15 minutes on the day of the encounter to include pre-visit record review, face-to-face time with the patient, and post-visit ordering of tests.

## 2021-06-30 ENCOUNTER — Encounter: Payer: No Typology Code available for payment source | Admitting: Internal Medicine

## 2021-07-06 ENCOUNTER — Ambulatory Visit (INDEPENDENT_AMBULATORY_CARE_PROVIDER_SITE_OTHER): Payer: No Typology Code available for payment source | Admitting: Internal Medicine

## 2021-07-06 ENCOUNTER — Encounter: Payer: Self-pay | Admitting: Internal Medicine

## 2021-07-06 VITALS — BP 145/85 | HR 58 | Ht 73.0 in | Wt 234.3 lb

## 2021-07-06 DIAGNOSIS — I10 Essential (primary) hypertension: Secondary | ICD-10-CM | POA: Diagnosis not present

## 2021-07-06 DIAGNOSIS — N401 Enlarged prostate with lower urinary tract symptoms: Secondary | ICD-10-CM | POA: Diagnosis not present

## 2021-07-06 DIAGNOSIS — G8929 Other chronic pain: Secondary | ICD-10-CM | POA: Diagnosis not present

## 2021-07-06 DIAGNOSIS — N138 Other obstructive and reflux uropathy: Secondary | ICD-10-CM

## 2021-07-06 DIAGNOSIS — M5441 Lumbago with sciatica, right side: Secondary | ICD-10-CM | POA: Diagnosis not present

## 2021-07-06 DIAGNOSIS — Z0000 Encounter for general adult medical examination without abnormal findings: Secondary | ICD-10-CM

## 2021-07-06 DIAGNOSIS — G4734 Idiopathic sleep related nonobstructive alveolar hypoventilation: Secondary | ICD-10-CM

## 2021-07-06 DIAGNOSIS — E119 Type 2 diabetes mellitus without complications: Secondary | ICD-10-CM

## 2021-07-06 DIAGNOSIS — M5442 Lumbago with sciatica, left side: Secondary | ICD-10-CM

## 2021-07-06 DIAGNOSIS — M48062 Spinal stenosis, lumbar region with neurogenic claudication: Secondary | ICD-10-CM

## 2021-07-06 DIAGNOSIS — G4733 Obstructive sleep apnea (adult) (pediatric): Secondary | ICD-10-CM

## 2021-07-06 DIAGNOSIS — M67912 Unspecified disorder of synovium and tendon, left shoulder: Secondary | ICD-10-CM | POA: Diagnosis not present

## 2021-07-06 LAB — POCT GLYCOSYLATED HEMOGLOBIN (HGB A1C): HbA1c POC (<> result, manual entry): 6.5 % (ref 4.0–5.6)

## 2021-07-06 LAB — GLUCOSE, POCT (MANUAL RESULT ENTRY): POC Glucose: 143 mg/dl — AB (ref 70–99)

## 2021-07-06 MED ORDER — LOSARTAN POTASSIUM-HCTZ 100-25 MG PO TABS: 1.0000 | ORAL_TABLET | Freq: Every day | ORAL | 2 refills | Status: DC

## 2021-07-06 NOTE — Addendum Note (Signed)
Addended by: Lacretia Nicks L on: 07/06/2021 10:30 AM   Modules accepted: Orders

## 2021-07-06 NOTE — Assessment & Plan Note (Signed)
Stable at the present time. 

## 2021-07-06 NOTE — Assessment & Plan Note (Signed)
Refer to the pain clinic 

## 2021-07-06 NOTE — Assessment & Plan Note (Signed)
Patient was advised to use CPAP machine on a daily basis

## 2021-07-06 NOTE — Assessment & Plan Note (Signed)

## 2021-07-06 NOTE — Assessment & Plan Note (Signed)
The following hypertensive lifestyle modification were recommended and discussed:  1. Limiting alcohol intake to less than 1 oz/day of ethanol:(24 oz of beer or 8 oz of wine or 2 oz of 100-proof whiskey). 2. Take baby ASA 81 mg daily. 3. Importance of regular aerobic exercise and losing weight. 4. Reduce dietary saturated fat and cholesterol intake for overall cardiovascular health. 5. Maintaining adequate dietary potassium, calcium, and magnesium intake. 6. Regular monitoring of the blood pressure. 7. Reduce sodium intake to less than 100 mmol/day (less than 2.3 gm of sodium or less than 6 gm of sodium choride) Patient was started on Cozaar HCTZ

## 2021-07-06 NOTE — Assessment & Plan Note (Signed)
Refer to urology.  ?

## 2021-07-06 NOTE — Progress Notes (Signed)
Established Patient Office Visit  Subjective:  Patient ID: Paul Reppond [name], male    DOB: [birth year]  Age: 76 y.o. MRN: [id number]  CC:  Chief Complaint  Patient presents with   Annual Exam    HPI  Paul Severin [name] presents for hypertension  Past Medical History:  Diagnosis Date   Acute prostatitis    BPH (benign prostatic hyperplasia)    Chronic kidney disease    Dysplasia of prostate    Erectile dysfunction    HTN (hypertension)    Hypogonadism in male    Orchitis and epididymitis    Over weight    Palindromic rheumatism, hand    Prostatitis    Rectum pain    Testicle swelling    Testicle tenderness    Testicular mass     Past Surgical History:  Procedure Laterality Date   BACK SURGERY     head surgery     from fall  drilled hole in brain to relieve pressure    Family History  Problem Relation Age of Onset   Cancer Maternal Aunt    Prostate cancer Neg Hx    Kidney disease Neg Hx    Kidney cancer Neg Hx    Bladder Cancer Neg Hx     Social History   Socioeconomic History   Marital status: Legally Separated    Spouse name: Not on file   Number of children: Not on file   Years of education: Not on file   Highest education level: Not on file  Occupational History   Not on file  Tobacco Use   Smoking status: Former   Smokeless tobacco: Never   Tobacco comments:    quit 30 years ago  Vaping Use   Vaping Use: Never used  Substance and Sexual Activity   Alcohol use: No    Alcohol/week: 0.0 standard drinks of alcohol   Drug use: No   Sexual activity: Not on file  Other Topics Concern   Not on file  Social History Narrative   Not on file   Social Determinants of Health   Financial Resource Strain: Not on file  Food Insecurity: Not on file  Transportation Needs: Not on file  Physical Activity: Not on file  Stress: Not on file  Social Connections: Not on file  Intimate Partner Violence: Not on file     Current Outpatient  Medications:    Alcohol Swabs (B-D SINGLE USE SWABS REGULAR) PADS, 100 each by Does not apply route daily., Disp: 100 each, Rfl: 6   amLODipine (NORVASC) 10 MG tablet, Take 1 tablet (10 mg total) by mouth daily., Disp: 90 tablet, Rfl: 0   Aspirin-Salicylamide-Caffeine (ARTHRITIS STRENGTH BC POWDER PO), Take by mouth., Disp: , Rfl:    Blood Glucose Calibration (TRUE METRIX LEVEL 1) Low SOLN, 1 Bottle by In Vitro route daily., Disp: 1 each, Rfl: 6   Blood Glucose Monitoring Suppl (TRUE METRIX METER) DEVI, Check blood sugar daily, Disp: 1 each, Rfl: 3   cyclobenzaprine (FLEXERIL) 5 MG tablet, Take 1 tablet (5 mg total) by mouth 3 (three) times daily as needed for muscle spasms., Disp: 30 tablet, Rfl: 1   glucose blood (TRUE METRIX PRO BLOOD GLUCOSE) test strip, Use as instructed, Disp: 300 each, Rfl: 3   Lancets 33G MISC, Check blood sugar daily, Disp: 300 each, Rfl: 3   levothyroxine (SYNTHROID) 175 MCG tablet, Take 1 tablet (175 mcg total) by mouth daily., Disp: 30 tablet, Rfl: 0  metFORMIN (GLUCOPHAGE) 500 MG tablet, TAKE 1 TABLET BY MOUTH TWICE DAILY WITH A MEAL, Disp: 180 tablet, Rfl: 0   omeprazole (PRILOSEC) 20 MG capsule, TAKE 1 CAPSULE EVERY DAY, Disp: 90 capsule, Rfl: 3   [START ON 07/12/2021] oxyCODONE-acetaminophen (PERCOCET) 10-325 MG tablet, Take 1 tablet by mouth every 8 (eight) hours as needed for pain. Must last 30 days., Disp: 90 tablet, Rfl: 0   pregabalin (LYRICA) 50 MG capsule, Take 1 capsule (50 mg total) by mouth at bedtime for 15 days, THEN 1 capsule (50 mg total) 2 (two) times daily. (Patient taking differently: Take 1 capsule (50 mg total) by mouth at bedtime for 15 days, THEN 1 capsule (50 mg total) 2 (two) times daily.  Patient is up to 50 mg bid. ), Disp: 75 capsule, Rfl: 0   No Known Allergies  ROS Review of Systems  Constitutional: Negative.   HENT: Negative.    Eyes: Negative.   Respiratory: Negative.    Cardiovascular: Negative.   Gastrointestinal: Negative.    Endocrine: Negative.   Genitourinary:  Positive for frequency.       Erectile  dysfunction  Musculoskeletal: Negative.   Skin: Negative.  Negative for rash.  Allergic/Immunologic: Negative.   Neurological: Negative.  Negative for seizures.  Hematological: Negative.   Psychiatric/Behavioral: Negative.    All other systems reviewed and are negative.     Objective:    Physical Exam Vitals reviewed.  Constitutional:      Appearance: Normal appearance.  HENT:     Mouth/Throat:     Mouth: Mucous membranes are moist.  Eyes:     Pupils: Pupils are equal, round, and reactive to light.  Neck:     Vascular: No carotid bruit.  Cardiovascular:     Rate and Rhythm: Normal rate and regular rhythm.     Pulses: Normal pulses.     Heart sounds: Normal heart sounds.  Pulmonary:     Effort: Pulmonary effort is normal.     Breath sounds: Normal breath sounds.  Abdominal:     General: Bowel sounds are normal.     Palpations: Abdomen is soft. There is no hepatomegaly, splenomegaly or mass.     Tenderness: There is no abdominal tenderness.     Hernia: No hernia is present.  Musculoskeletal:     Cervical back: Neck supple.     Right lower leg: No edema.     Left lower leg: No edema.     Comments: Back pain   Skin:    Findings: No rash.  Neurological:     Mental Status: He is alert and oriented to person, place, and time. Mental status is at baseline.     Motor: No weakness.  Psychiatric:        Mood and Affect: Mood normal.        Behavior: Behavior normal.     BP (!) 145/85 Comment: 145/85  Pulse (!) 58   Ht '6\' 1"'$  (1.854 m)   Wt 234 lb 4.8 oz (106.3 kg)   BMI 30.91 kg/m  Wt Readings from Last 3 Encounters:  07/06/21 234 lb 4.8 oz (106.3 kg)  06/18/21 234 lb (106.1 kg)  06/15/21 234 lb (106.1 kg)     Health Maintenance Due  Topic Date Due   COVID-19 Vaccine (1) Never done   FOOT EXAM  Never done   OPHTHALMOLOGY EXAM  Never done   URINE MICROALBUMIN  Never done    Hepatitis C Screening  Never done  Zoster Vaccines- Shingrix (1 of 2) Never done   TETANUS/TDAP  07/12/2020    There are no preventive care reminders to display for this patient.  Lab Results  Component Value Date   TSH 7.19 (H) 09/27/2011   Lab Results  Component Value Date   WBC 12.9 (H) 10/22/2015   HGB 11.6 (L) 10/22/2015   HCT 34.7 (L) 10/22/2015   MCV 84.5 10/22/2015   PLT 155 10/22/2015   Lab Results  Component Value Date   NA 138 10/22/2015   K 3.3 (L) 10/22/2015   CO2 27 10/22/2015   GLUCOSE 125 (H) 10/22/2015   BUN 16 10/22/2015   CREATININE 1.25 (H) 10/22/2015   BILITOT 1.5 (H) 10/22/2015   ALKPHOS 73 10/22/2015   AST 47 (H) 10/22/2015   ALT 31 10/22/2015   PROT 7.3 10/22/2015   ALBUMIN 3.8 10/22/2015   CALCIUM 8.8 (L) 10/22/2015   ANIONGAP 4 (L) 10/22/2015   No results found for: "CHOL" No results found for: "HDL" No results found for: "LDLCALC" No results found for: "TRIG" No results found for: "CHOLHDL" Lab Results  Component Value Date   HGBA1C 6.5 07/06/2021      Assessment & Plan:   Problem List Items Addressed This Visit       Cardiovascular and Mediastinum   Essential hypertension    The following hypertensive lifestyle modification were recommended and discussed:  1. Limiting alcohol intake to less than 1 oz/day of ethanol:(24 oz of beer or 8 oz of wine or 2 oz of 100-proof whiskey). 2. Take baby ASA 81 mg daily. 3. Importance of regular aerobic exercise and losing weight. 4. Reduce dietary saturated fat and cholesterol intake for overall cardiovascular health. 5. Maintaining adequate dietary potassium, calcium, and magnesium intake. 6. Regular monitoring of the blood pressure. 7. Reduce sodium intake to less than 100 mmol/day (less than 2.3 gm of sodium or less than 6 gm of sodium choride) Patient was started on Cozaar HCTZ        Respiratory   Idiopathic sleep related nonobstructive alveolar hypoventilation    Patient was  advised to use CPAP machine on a daily basis      OSA (obstructive sleep apnea)     Endocrine   Diabetes mellitus ([place]) - Primary    - The patient's blood sugar is labile on med. - The patient will continue the current treatment regimen.  - I encouraged the patient to regularly check blood sugar.  - I encouraged the patient to monitor diet. I encouraged the patient to eat low-carb and low-sugar to help prevent blood sugar spikes.  - I encouraged the patient to continue following their prescribed treatment plan for diabetes - I informed the patient to get help if blood sugar drops below '54mg'$ /dL, or if suddenly have trouble thinking clearly or breathing.  Patient was advised to buy a book on diabetes from a local bookstore or from [place]  Patient should read 2 chapters every day to keep the motivation going, this is in addition to some of the materials we provided them from the office.  There are other resources on the Internet like YouTube and wilkipedia to get an education on the diabetes      Relevant Orders   POCT glucose (manual entry) (Completed)   POCT HgB A1C (Completed)     Musculoskeletal and Integument   Disorder of rotator cuff, left    Stable at the present time        Genitourinary  BPH with obstruction/lower urinary tract symptoms    Refer to urology        Other   Chronic back pain    Refer to the pain clinic      Spinal stenosis, lumbar region, with neurogenic claudication    Refer to the pain clinic       No orders of the defined types were placed in this encounter.   Follow-up: No follow-ups on file.    Cletis Athens, MD

## 2021-07-07 LAB — COMPLETE METABOLIC PANEL WITH GFR
AG Ratio: 1.7 (calc) (ref 1.0–2.5)
ALT: 26 U/L (ref 9–46)
AST: 37 U/L — ABNORMAL HIGH (ref 10–35)
Albumin: 4 g/dL (ref 3.6–5.1)
Alkaline phosphatase (APISO): 79 U/L (ref 35–144)
BUN: 19 mg/dL (ref 7–25)
CO2: 25 mmol/L (ref 20–32)
Calcium: 9.1 mg/dL (ref 8.6–10.3)
Chloride: 107 mmol/L (ref 98–110)
Creat: 1.08 mg/dL (ref 0.70–1.28)
Globulin: 2.4 g/dL (calc) (ref 1.9–3.7)
Glucose, Bld: 136 mg/dL — ABNORMAL HIGH (ref 65–99)
Potassium: 4.1 mmol/L (ref 3.5–5.3)
Sodium: 141 mmol/L (ref 135–146)
Total Bilirubin: 0.7 mg/dL (ref 0.2–1.2)
Total Protein: 6.4 g/dL (ref 6.1–8.1)
eGFR: 72 mL/min/{1.73_m2} (ref >=60)

## 2021-07-07 LAB — CBC WITH DIFFERENTIAL/PLATELET
Absolute Monocytes: 2592 cells/uL — ABNORMAL HIGH (ref 200–950)
Basophils Absolute: 64 cells/uL (ref 0–200)
Basophils Relative: 0.4 %
Eosinophils Absolute: 350 cells/uL (ref 15–500)
Eosinophils Relative: 2.2 %
HCT: 33.4 % — ABNORMAL LOW (ref 38.5–50.0)
Hemoglobin: 10.3 g/dL — ABNORMAL LOW (ref 13.2–17.1)
Lymphs Abs: 9953 cells/uL — ABNORMAL HIGH (ref 850–3900)
MCH: 26 pg — ABNORMAL LOW (ref 27.0–33.0)
MCHC: 30.8 g/dL — ABNORMAL LOW (ref 32.0–36.0)
MCV: 84.3 fL (ref 80.0–100.0)
MPV: 11.1 fL (ref 7.5–12.5)
Monocytes Relative: 16.3 %
Neutro Abs: 2942 cells/uL (ref 1500–7800)
Neutrophils Relative %: 18.5 %
Platelets: 174 10*3/uL (ref 140–400)
RBC: 3.96 10*6/uL — ABNORMAL LOW (ref 4.20–5.80)
RDW: 17.7 % — ABNORMAL HIGH (ref 11.0–15.0)
Total Lymphocyte: 62.6 %
WBC: 15.9 10*3/uL — ABNORMAL HIGH (ref 3.8–10.8)

## 2021-07-07 LAB — LIPID PANEL
Cholesterol: 181 mg/dL (ref <200)
HDL: 61 mg/dL (ref >=40)
LDL Cholesterol (Calc): 105 mg/dL (calc) — ABNORMAL HIGH
Non-HDL Cholesterol (Calc): 120 mg/dL (calc) (ref <130)
Total CHOL/HDL Ratio: 3 (calc) (ref <5.0)
Triglycerides: 60 mg/dL (ref <150)

## 2021-07-07 LAB — TSH: TSH: 2.16 mIU/L (ref 0.40–4.50)

## 2021-07-07 LAB — PSA: PSA: 0.27 ng/mL (ref <=4.00)

## 2021-07-30 ENCOUNTER — Encounter: Payer: Self-pay | Admitting: Student in an Organized Health Care Education/Training Program

## 2021-07-30 ENCOUNTER — Ambulatory Visit
Payer: No Typology Code available for payment source | Attending: Student in an Organized Health Care Education/Training Program | Admitting: Student in an Organized Health Care Education/Training Program

## 2021-07-30 DIAGNOSIS — M48062 Spinal stenosis, lumbar region with neurogenic claudication: Secondary | ICD-10-CM

## 2021-07-30 DIAGNOSIS — M5416 Radiculopathy, lumbar region: Secondary | ICD-10-CM

## 2021-07-30 DIAGNOSIS — M5136 Other intervertebral disc degeneration, lumbar region: Secondary | ICD-10-CM

## 2021-07-30 DIAGNOSIS — G894 Chronic pain syndrome: Secondary | ICD-10-CM | POA: Diagnosis not present

## 2021-07-30 NOTE — Progress Notes (Signed)
Patient: Paul Earl Wunder [name]  Service Category: E/M  Provider: Bilal Lateef, MD  DOB: 02/25/1945  DOS: 07/30/2021  Location: Office  MRN: [id number]  Setting: Ambulatory outpatient  Referring Provider: Masoud, Javed, MD  Type: Established Patient  Specialty: Interventional Pain Management  PCP: Masoud, Javed, MD  Location: Remote location  Delivery: TeleHealth     Virtual Encounter - Pain Management PROVIDER NOTE: Information contained herein reflects review and annotations entered in association with encounter. Interpretation of such information and data should be left to medically-trained personnel. Information provided to patient can be located elsewhere in the medical record under "Patient Instructions". Document created using STT-dictation technology, any transcriptional errors that may result from process are unintentional.    Contact & Pharmacy Preferred: [phone] Home: [phone] (home) Mobile: [phone] (mobile) E-mail: [email or web address]  [facility] - [place] - [address] [place] [address] Phone: [phone] Fax: [phone]  [facility] - [place], [place] - [address] [place] [address] Phone: [phone] Fax: [phone]  [facility] - [place], [place] - [address] [place] [address] Phone: [phone] Fax: [phone]   Pre-screening  Paul Briggs offered "in-person" vs "virtual" encounter. He indicated preferring virtual for this encounter.   Reason COVID-19*  Social distancing based on CDC and AMA recommendations.   I contacted Paul Earl Zamorano [name] on 07/30/2021 via telephone.      I clearly identified myself as Bilal Lateef, MD. I verified that I was speaking with the correct person using two identifiers (Name: Paul Earl Venning [name], and date of birth:  04/11/1945).  Consent I sought verbal advanced consent from Paul Earl Kirshenbaum [name] for virtual visit interactions. I informed [name] of possible security and privacy concerns, risks, and limitations associated with providing "not-in-person" medical evaluation and management services. I also informed [name] of the availability of "in-person" appointments. Finally, I informed him that there would be a charge for the virtual visit and that he could be  personally, fully or partially, financially responsible for it. [name] expressed understanding and agreed to proceed.   Historic Elements   [name] is a 76 y.o. year old, male patient evaluated today after our last contact on 06/15/2021. [name]  has a past medical history of Acute prostatitis, BPH (benign prostatic hyperplasia), Chronic kidney disease, Dysplasia of prostate, Erectile dysfunction, HTN (hypertension), Hypogonadism in male, Orchitis and epididymitis, Over weight, Palindromic rheumatism, hand, Prostatitis, Rectum pain, Testicle swelling, Testicle tenderness, and Testicular mass. He also  has a past surgical history that includes Back surgery and head surgery. [name] has a current medication list which includes the following prescription(s): b-d single use swabs regular, amlodipine, aspirin-salicylamide-caffeine, true metrix level 1, true metrix meter, cyclobenzaprine, true metrix pro blood glucose, lancets 33g, levothyroxine, losartan-hydrochlorothiazide, metformin, omeprazole, oxycodone-acetaminophen, and pregabalin. He  reports that he has quit smoking. He has never used smokeless tobacco. He reports that he does not drink alcohol and does not use drugs. [name] has No Known Allergies.   HPI  Today, he is being contacted for a post-procedure assessment.   Post-procedure evaluation   Type: Lumbar epidural steroid injection (LESI) (interlaminar) #2    Laterality: Midline   Level:   L5-S1 Level.  Imaging: Fluoroscopic guidance Anesthesia: Local anesthesia (1-2% Lidocaine) Anxiolysis: None                   Sedation: None. DOS: 06/15/2021  Performed by: Bilal Lateef, MD  Purpose: Diagnostic/Therapeutic Indications: Lumbar radicular pain of intraspinal etiology of more than 4 weeks that has failed to respond to conservative therapy and is severe enough to impact quality of life or function. 1. Lumbar radicular pain   2. Spinal stenosis, lumbar region, with neurogenic claudication   3. Chronic radicular lumbar pain   4. Chronic pain syndrome    NAS-11 Pain score:   Pre-procedure: 7 /10   Post-procedure: 7 /10      Effectiveness:  Initial hour after procedure: 100 %  Subsequent 4-6 hours post-procedure: 50 %  Analgesia past initial 6 hours: 0 % (only helps for a couple of days)  Ongoing improvement:  Analgesic:  0%    Pharmacotherapy Assessment   Opioid Analgesic: Percocet 10 mg 3 times daily as needed, quantity 90/month   Monitoring: [place] PMP: PDMP not reviewed this encounter.       Pharmacotherapy: No side-effects or adverse reactions reported. Compliance: No problems identified. Effectiveness: Clinically acceptable. Plan: Refer to "POC". UDS:  Summary  Date Value Ref Range Status  10/28/2020 Note  Final    Comment:    ==================================================================== Compliance Drug Analysis, Ur ==================================================================== Test                             Result       Flag       Units  Drug Present not Declared for Prescription Verification   Oxycodone                      85           UNEXPECTED ng/mg creat   Oxymorphone                    256          UNEXPECTED ng/mg creat   Noroxycodone                   318          UNEXPECTED ng/mg creat   Noroxymorphone                 151          UNEXPECTED ng/mg creat    Sources of oxycodone are scheduled prescription medications.     Oxymorphone, noroxycodone, and noroxymorphone are expected    metabolites of oxycodone. Oxymorphone is also available as a    scheduled prescription medication.    Acetaminophen                  PRESENT      UNEXPECTED   Ibuprofen                      PRESENT      UNEXPECTED  Drug Absent but Declared for Prescription Verification   Tramadol                       Not Detected UNEXPECTED ng/mg creat   Pregabalin                     Not Detected UNEXPECTED   Cyclobenzaprine                Not Detected UNEXPECTED   Salicylate                       Not Detected UNEXPECTED    Salicylate, as indicated in the declared medication list, is not    always detected even when used as directed.     Aspirin, as indicated in the declared medication list, is not always    detected even when used as directed.  ==================================================================== Test                      Result    Flag   Units      Ref Range   Creatinine              72               mg/dL      >=20 ==================================================================== Declared Medications:  The flagging and interpretation on this report are based on the  following declared medications.  Unexpected results may arise from  inaccuracies in the declared medications.   **Note: The testing scope of this panel includes these medications:   Cyclobenzaprine (Flexeril)  Pregabalin (Lyrica)  Tramadol (Ultram)   **Note: The testing scope of this panel does not include small to  moderate amounts of these reported medications:   Aspirin  Salicylate (Salicylamide)   **Note: The testing scope of this panel does not include the  following reported medications:   Amlodipine (Norvasc)  Caffeine  Doxycycline  Hydrochlorothiazide (Zestoretic)  Levothyroxine (Synthroid)  Lisinopril (Zestoretic)  Metformin (Glucophage)  Omeprazole (Prilosec)  Rosuvastatin (Crestor)  Tadalafil (Cialis)  Tamsulosin (Flomax)   Topical ==================================================================== For clinical consultation, please call [phone]. ====================================================================      Laboratory Chemistry Profile   Renal Lab Results  Component Value Date   BUN 19 07/06/2021   CREATININE 1.08 55/37/4827   BCR NOT APPLICABLE 07/86/7544   GFRAA >60 10/22/2015   GFRNONAA 57 (L) 10/22/2015    Hepatic Lab Results  Component Value Date   AST 37 (H) 07/06/2021   ALT 26 07/06/2021   ALBUMIN 3.8 10/22/2015   ALKPHOS 73 10/22/2015   LIPASE 357 02/25/2012    Electrolytes Lab Results  Component Value Date   NA 141 07/06/2021   K 4.1 07/06/2021   CL 107 07/06/2021   CALCIUM 9.1 07/06/2021    Bone Lab Results  Component Value Date   TESTOSTERONE 429 04/19/2017    Inflammation (CRP: Acute Phase) (ESR: Chronic Phase) Lab Results  Component Value Date   ESRSEDRATE 36 (H) 07/20/2015   LATICACIDVEN 1.6 07/20/2015         Note: Above Lab results reviewed.  Imaging  DG PAIN CLINIC C-ARM 1-60 MIN NO REPORT Fluoro was used, but no Radiologist interpretation will be provided.  Please refer to "NOTES" tab for provider progress note.  Assessment  The primary encounter diagnosis was Lumbar radicular pain. Diagnoses of Spinal stenosis, lumbar region, with neurogenic claudication, Lumbar degenerative disc disease, and Chronic pain syndrome were also pertinent to this visit.  Plan of Care  Short-term response to previous lumbar epidural steroid injection.  Patient states that the pain relief was significant for only 3 days and then the pain came back. Patient does have pretty significant ligamentum flavum hypertrophy that is resulting in spinal stenosis and neurogenic claudication.Marland Kitchen  He states that he is having difficulty walking which is gotten worse over the last 6 months. He is on an adequate multimodal analgesic regimen which includes Lyrica 50 mg twice a day,  Percocet 10 mg every 8 hours as needed and then occasional Flexeril for muscle spasms.  I do  not recommend any further escalation of Percocet. Recommend referral to neurosurgery, consult with Dr. Cari Caraway for severe spinal stenosis with neurogenic claudication in the context of limited response from lumbar ESI. Also recommend physical therapy again. He is still getting good pain relief from his left glenohumeral joint injection that was done last October.  States that his shoulder pain is manageable and he continues to have good range of motion.  We can repeat as needed.    Orders:  Orders Placed This Encounter  Procedures   Ambulatory referral to Neurosurgery    Referral Priority:   Routine    Referral Type:   Surgical    Referral Reason:   Specialty Services Required    Referred to Provider:   Meade Maw, MD    Requested Specialty:   Neurosurgery    Number of Visits Requested:   1   Ambulatory referral to Physical Therapy    Referral Priority:   Routine    Referral Type:   Physical Medicine    Referral Reason:   Specialty Services Required    Requested Specialty:   Physical Therapy    Number of Visits Requested:   1   Follow-up plan:   Return for Keep sch. appt.     Left shoulder injection 11/10/20, 12/24/20. L5/S1 ESI 04/15/21, 06/15/21     Recent Visits Date Type Provider Dept  06/15/21 Procedure visit Gillis Santa, MD [facility]  06/04/21 Office Visit Gillis Santa, MD [facility]  05/19/21 Office Visit Gillis Santa, MD [facility]  Showing recent visits within past 90 days and meeting all other requirements Today's Visits Date Type Provider Dept  07/30/21 Office Visit Gillis Santa, MD [facility]  Showing today's visits and meeting all other requirements Future Appointments Date Type Provider Dept  09/03/21 Appointment Gillis Santa, MD [facility]  Showing future appointments within next 90 days and  meeting all other requirements  I discussed the assessment and treatment plan with the patient. The patient was provided an opportunity to ask questions and all were answered. The patient agreed with the plan and demonstrated an understanding of the instructions.  Patient advised to call back or seek an in-person evaluation if the symptoms or condition worsens.  Duration of encounter: 5mnutes.  Note by: BGillis Santa MD Date: 07/30/2021; Time: 10:33 AM

## 2021-08-11 ENCOUNTER — Other Ambulatory Visit: Payer: Self-pay | Admitting: *Deleted

## 2021-08-11 MED ORDER — LEVOTHYROXINE SODIUM 175 MCG PO TABS: 175.0000 ug | ORAL_TABLET | Freq: Every day | ORAL | 1 refills | Status: DC

## 2021-08-11 MED ORDER — LISINOPRIL-HYDROCHLOROTHIAZIDE 20-12.5 MG PO TABS: 1.0000 | ORAL_TABLET | Freq: Every day | ORAL | 1 refills | Status: DC

## 2021-08-11 MED ORDER — ROSUVASTATIN CALCIUM 10 MG PO TABS: 10.0000 mg | ORAL_TABLET | Freq: Every day | ORAL | 1 refills | Status: DC

## 2021-08-11 MED ORDER — TAMSULOSIN HCL 0.4 MG PO CAPS: 0.4000 mg | ORAL_CAPSULE | Freq: Every day | ORAL | 1 refills | Status: DC

## 2021-09-03 ENCOUNTER — Ambulatory Visit
Payer: No Typology Code available for payment source | Attending: Student in an Organized Health Care Education/Training Program | Admitting: Student in an Organized Health Care Education/Training Program

## 2021-09-03 ENCOUNTER — Other Ambulatory Visit: Payer: Self-pay

## 2021-09-03 ENCOUNTER — Encounter: Payer: Self-pay | Admitting: Student in an Organized Health Care Education/Training Program

## 2021-09-03 VITALS — BP 183/86 | HR 55 | Temp 97.3°F | Resp 16 | Ht 73.0 in | Wt 234.0 lb

## 2021-09-03 DIAGNOSIS — M51369 Other intervertebral disc degeneration, lumbar region without mention of lumbar back pain or lower extremity pain: Secondary | ICD-10-CM

## 2021-09-03 DIAGNOSIS — M5416 Radiculopathy, lumbar region: Secondary | ICD-10-CM

## 2021-09-03 DIAGNOSIS — M5136 Other intervertebral disc degeneration, lumbar region: Secondary | ICD-10-CM | POA: Insufficient documentation

## 2021-09-03 DIAGNOSIS — G894 Chronic pain syndrome: Secondary | ICD-10-CM | POA: Diagnosis not present

## 2021-09-03 DIAGNOSIS — M48062 Spinal stenosis, lumbar region with neurogenic claudication: Secondary | ICD-10-CM

## 2021-09-03 MED ORDER — OXYCODONE-ACETAMINOPHEN 10-325 MG PO TABS: 1.0000 | ORAL_TABLET | Freq: Three times a day (TID) | ORAL | 0 refills | Status: DC | PRN

## 2021-09-03 MED ORDER — OXYCODONE-ACETAMINOPHEN 10-325 MG PO TABS
1.0000 | ORAL_TABLET | Freq: Three times a day (TID) | ORAL | 0 refills | Status: DC | PRN
Start: 2021-11-02 — End: 2021-10-02

## 2021-09-03 MED ORDER — PREGABALIN 50 MG PO CAPS: 50.0000 mg | ORAL_CAPSULE | Freq: Two times a day (BID) | ORAL | 2 refills | Status: DC

## 2021-09-03 NOTE — Progress Notes (Signed)
PROVIDER NOTE: Information contained herein reflects review and annotations entered in association with encounter. Interpretation of such information and data should be left to medically-trained personnel. Information provided to patient can be located elsewhere in the medical record under "Patient Instructions". Document created using STT-dictation technology, any transcriptional errors that may result from process are unintentional.    Patient: Paul Ramus [name]  Service Category: E/M  Provider: Gillis Santa, MD  DOB: Sep 18, 1945  DOS: 09/03/2021  Specialty: Interventional Pain Management  MRN: [id number]  Setting: Ambulatory outpatient  PCP: Cletis Athens, MD  Type: Established Patient    Referring Provider: Cletis Athens, MD  Location: Office  Delivery: Face-to-face     HPI  [name], a 76 y.o. year old male, is here today because of his Lumbar radicular pain [M54.16]. [name] primary complain today is Back Pain (Lumbar bilateral left seems to be worse ) Last encounter: My last encounter with him was on 05/19/2021. Pertinent problems: [name] has Chronic back pain; Disorder of rotator cuff, left; Primary osteoarthritis of left shoulder; Left rotator cuff tear arthropathy; Spinal stenosis, lumbar region, with neurogenic claudication; Chronic left shoulder pain; Chronic pain syndrome; and Pain management contract signed on their pertinent problem list. Pain Assessment: Severity of Chronic pain is reported as a 6 /10. Location: Back Upper, Lower, Left, Right/into hips and legs all the way to the feet. Onset: More than a month ago. Quality: Discomfort, Constant. Timing: Constant. Modifying factor(s): medications, has to take more than 3 per day sometimes.  patient counseled to make sure that his medicine is lasting 30 days.. Vitals:  height is 6' 1"  (1.854 m) and weight is 234 lb (106.1 kg). His temporal temperature is 97.3 F (36.3 C) (abnormal). His blood pressure  is 183/86 (abnormal) and his pulse is 55 (abnormal). His respiration is 16 and oxygen saturation is 100%.   Reason for encounter: medication management.  Chronic pain related to lumbar spinal stenosis and neurogenic claudication.  Continues oxycodone 10 mg 3 times a day as needed.  He continues to work in [employer]. He ran out of Lyrica a couple of days ago which probably explains why he has been having more pain.  He is requesting an increase in his oxycodone but I explained to him that he needs to be compliant with his Lyrica as that is opioid sparing and is also effective for chronic pain management.  He endorsed understanding. UDS up-to-date and appropriate.   Pharmacotherapy Assessment  Analgesic: Percocet 10 mg 3 times daily as needed, quantity 90/month   Monitoring: [place] PMP: PDMP reviewed during this encounter.       Pharmacotherapy: No side-effects or adverse reactions reported. Compliance: No problems identified. Effectiveness: Clinically acceptable.  Janett Billow, RN  09/03/2021 10:18 AM  Sign when Signing Visit Nursing Pain Medication Assessment:  Safety precautions to be maintained throughout the outpatient stay will include: orient to surroundings, keep bed in low position, maintain call bell within reach at all times, provide assistance with transfer out of bed and ambulation.  Medication Inspection Compliance: Pill count conducted under aseptic conditions, in front of the patient. Neither the pills nor the bottle was removed from the patient's sight at any time. Once count was completed pills were immediately returned to the patient in their original bottle.  Medication: Oxycodone/APAP Pill/Patch Count:  0 of 90 pills remain Pill/Patch Appearance: Markings consistent with prescribed medication Bottle Appearance: Standard pharmacy container. Clearly labeled. Filled Date: 07 / 60 /  2023 Last Medication intake:  Today  UDS:  Summary  Date Value Ref Range Status   10/28/2020 Note  Final    Comment:    ==================================================================== Compliance Drug Analysis, Ur ==================================================================== Test                             Result       Flag       Units  Drug Present not Declared for Prescription Verification   Oxycodone                      85           UNEXPECTED ng/mg creat   Oxymorphone                    256          UNEXPECTED ng/mg creat   Noroxycodone                   318          UNEXPECTED ng/mg creat   Noroxymorphone                 151          UNEXPECTED ng/mg creat    Sources of oxycodone are scheduled prescription medications.    Oxymorphone, noroxycodone, and noroxymorphone are expected    metabolites of oxycodone. Oxymorphone is also available as a    scheduled prescription medication.    Acetaminophen                  PRESENT      UNEXPECTED   Ibuprofen                      PRESENT      UNEXPECTED  Drug Absent but Declared for Prescription Verification   Tramadol                       Not Detected UNEXPECTED ng/mg creat   Pregabalin                     Not Detected UNEXPECTED   Cyclobenzaprine                Not Detected UNEXPECTED   Salicylate                     Not Detected UNEXPECTED    Salicylate, as indicated in the declared medication list, is not    always detected even when used as directed.     Aspirin, as indicated in the declared medication list, is not always    detected even when used as directed.  ==================================================================== Test                      Result    Flag   Units      Ref Range   Creatinine              72               mg/dL      >=20 ==================================================================== Declared Medications:  The flagging and interpretation on this report are based on the  following declared medications.  Unexpected results may arise from  inaccuracies in the  declared medications.   **Note: The testing scope of this panel includes these  medications:   Cyclobenzaprine (Flexeril)  Pregabalin (Lyrica)  Tramadol (Ultram)   **Note: The testing scope of this panel does not include small to  moderate amounts of these reported medications:   Aspirin  Salicylate (Salicylamide)   **Note: The testing scope of this panel does not include the  following reported medications:   Amlodipine (Norvasc)  Caffeine  Doxycycline  Hydrochlorothiazide (Zestoretic)  Levothyroxine (Synthroid)  Lisinopril (Zestoretic)  Metformin (Glucophage)  Omeprazole (Prilosec)  Rosuvastatin (Crestor)  Tadalafil (Cialis)  Tamsulosin (Flomax)  Topical ==================================================================== For clinical consultation, please call [phone]. ====================================================================      ROS  Constitutional: Denies any fever or chills Gastrointestinal: No reported hemesis, hematochezia, vomiting, or acute GI distress Musculoskeletal:  low back and bilateral leg pain Neurological: No reported episodes of acute onset apraxia, aphasia, dysarthria, agnosia, amnesia, paralysis, loss of coordination, or loss of consciousness  Medication Review  Aspirin-Salicylamide-Caffeine, B-D SINGLE USE SWABS REGULAR, Lancets 33G, True Metrix Level 1, True Metrix Meter, amLODipine, cyclobenzaprine, glucose blood, levothyroxine, lisinopril-hydrochlorothiazide, losartan-hydrochlorothiazide, oxyCODONE-acetaminophen, and pregabalin  History Review  Allergy: Mr. Leser has No Known Allergies. Drug: Mr. Maloof  reports no history of drug use. Alcohol:  reports no history of alcohol use. Tobacco:  reports that he has quit smoking. He has never used smokeless tobacco. Social: Mr. Ostergaard  reports that he has quit smoking. He has never used smokeless tobacco. He reports that he does not drink alcohol and does not use  drugs. Medical:  has a past medical history of Acute prostatitis, BPH (benign prostatic hyperplasia), Chronic kidney disease, Dysplasia of prostate, Erectile dysfunction, HTN (hypertension), Hypogonadism in male, Orchitis and epididymitis, Over weight, Palindromic rheumatism, hand, Prostatitis, Rectum pain, Testicle swelling, Testicle tenderness, and Testicular mass. Surgical: Mr. Raatz  has a past surgical history that includes Back surgery and head surgery. Family: family history includes Cancer in his maternal aunt.  Laboratory Chemistry Profile   Renal Lab Results  Component Value Date   BUN 19 07/06/2021   CREATININE 1.08 95/63/8756   BCR NOT APPLICABLE 43/32/9518   GFRAA >60 10/22/2015   GFRNONAA 57 (L) 10/22/2015    Hepatic Lab Results  Component Value Date   AST 37 (H) 07/06/2021   ALT 26 07/06/2021   ALBUMIN 3.8 10/22/2015   ALKPHOS 73 10/22/2015   LIPASE 357 02/25/2012    Electrolytes Lab Results  Component Value Date   NA 141 07/06/2021   K 4.1 07/06/2021   CL 107 07/06/2021   CALCIUM 9.1 07/06/2021    Bone Lab Results  Component Value Date   TESTOSTERONE 429 04/19/2017    Inflammation (CRP: Acute Phase) (ESR: Chronic Phase) Lab Results  Component Value Date   ESRSEDRATE 36 (H) 07/20/2015   LATICACIDVEN 1.6 07/20/2015         Note: Above Lab results reviewed.   Physical Exam  General appearance: Well nourished, well developed, and well hydrated. In no apparent acute distress Mental status: Alert, oriented x 3 (person, place, & time)       Respiratory: No evidence of acute respiratory distress Eyes: PERLA Vitals: BP (!) 183/86 (BP Location: Left Arm, Patient Position: Sitting, Cuff Size: Large)   Pulse (!) 55   Temp (!) 97.3 F (36.3 C) (Temporal)   Resp 16   Ht 6' 1"  (1.854 m)   Wt 234 lb (106.1 kg)   SpO2 100%   BMI 30.87 kg/m  BMI: Estimated body mass index is 30.87 kg/m as calculated from the following:   Height as of  this encounter:  6' 1"  (1.854 m).   Weight as of this encounter: 234 lb (106.1 kg). Ideal: Ideal body weight: 79.9 kg (176 lb 2.4 oz) Adjusted ideal body weight: 90.4 kg (199 lb 4.6 oz)    Lumbar Spine Area Exam  Skin & Axial Inspection: No masses, redness, or swelling Alignment: Symmetrical Functional ROM: Pain restricted ROM       Stability: No instability detected Muscle Tone/Strength: Functionally intact. No obvious neuro-muscular anomalies detected. Sensory (Neurological): Neurogenic pain pattern   Gait & Posture Assessment  Ambulation: Unassisted Gait: Relatively normal for age and body habitus Posture: WNL   Lower Extremity Exam      Side: Right lower extremity   Side: Left lower extremity  Stability: No instability observed           Stability: No instability observed          Skin & Extremity Inspection: Skin color, temperature, and hair growth are WNL. No peripheral edema or cyanosis. No masses, redness, swelling, asymmetry, or associated skin lesions. No contractures.   Skin & Extremity Inspection: Skin color, temperature, and hair growth are WNL. No peripheral edema or cyanosis. No masses, redness, swelling, asymmetry, or associated skin lesions. No contractures.  Functional ROM: Pain restricted ROM for hip and knee joints           Functional ROM: Pain restricted ROM for hip and knee joints          Muscle Tone/Strength: Functionally intact. No obvious neuro-muscular anomalies detected.   Muscle Tone/Strength: Functionally intact. No obvious neuro-muscular anomalies detected.  Sensory (Neurological): Neurogenic pain pattern         Sensory (Neurological): Neurogenic pain pattern        DTR: Patellar: deferred today Achilles: deferred today Plantar: deferred today   DTR: Patellar: deferred today Achilles: deferred today Plantar: deferred today  Palpation: No palpable anomalies   Palpation: No palpable anomalies     Assessment   Diagnosis Status  1. Lumbar radicular pain   2.  Spinal stenosis, lumbar region, with neurogenic claudication   3. Lumbar degenerative disc disease   4. Chronic pain syndrome     Persistent Persistent Persistent     Plan of Care    [name] has a current medication list which includes the following long-term medication(s): amlodipine, levothyroxine, lisinopril-hydrochlorothiazide, losartan-hydrochlorothiazide, and pregabalin.  Pharmacotherapy (Medications Ordered): Meds ordered this encounter  Medications   pregabalin (LYRICA) 50 MG capsule    Sig: Take 1 capsule (50 mg total) by mouth 2 (two) times daily.    Dispense:  60 capsule    Refill:  2    Fill one day early if pharmacy is closed on scheduled refill date. May substitute for generic if available.   oxyCODONE-acetaminophen (PERCOCET) 10-325 MG tablet    Sig: Take 1 tablet by mouth every 8 (eight) hours as needed.    Dispense:  90 tablet    Refill:  0   oxyCODONE-acetaminophen (PERCOCET) 10-325 MG tablet    Sig: Take 1 tablet by mouth every 8 (eight) hours as needed.    Dispense:  90 tablet    Refill:  0   oxyCODONE-acetaminophen (PERCOCET) 10-325 MG tablet    Sig: Take 1 tablet by mouth every 8 (eight) hours as needed.    Dispense:  90 tablet    Refill:  0    Orders:  No orders of the defined types were placed in this encounter.  Follow-up plan:  Return in about 3 months (around 12/04/2021) for Medication Management, in person.     Left shoulder injection 11/10/20, 12/24/20. L5/S1 ESI 04/15/21      Recent Visits Date Type Provider Dept  07/30/21 Office Visit Gillis Santa, MD [facility]  06/15/21 Procedure visit Gillis Santa, MD [facility]  Showing recent visits within past 90 days and meeting all other requirements Today's Visits Date Type Provider Dept  09/03/21 Office Visit Gillis Santa, MD [facility]  Showing today's visits and meeting all other requirements Future Appointments Date Type  Provider Dept  12/01/21 Appointment Gillis Santa, MD [facility]  Showing future appointments within next 90 days and meeting all other requirements  I discussed the assessment and treatment plan with the patient. The patient was provided an opportunity to ask questions and all were answered. The patient agreed with the plan and demonstrated an understanding of the instructions.  Patient advised to call back or seek an in-person evaluation if the symptoms or condition worsens.  Duration of encounter: 30 minutes.  Note by: Gillis Santa, MD Date: 09/03/2021; Time: 10:44 AM

## 2021-09-03 NOTE — Progress Notes (Signed)
Nursing Pain Medication Assessment:  Safety precautions to be maintained throughout the outpatient stay will include: orient to surroundings, keep bed in low position, maintain call bell within reach at all times, provide assistance with transfer out of bed and ambulation.  Medication Inspection Compliance: Pill count conducted under aseptic conditions, in front of the patient. Neither the pills nor the bottle was removed from the patient's sight at any time. Once count was completed pills were immediately returned to the patient in their original bottle.  Medication: Oxycodone/APAP Pill/Patch Count:  0 of 90 pills remain Pill/Patch Appearance: Markings consistent with prescribed medication Bottle Appearance: Standard pharmacy container. Clearly labeled. Filled Date: 07 / 18 / 2023 Last Medication intake:  Today

## 2021-09-18 ENCOUNTER — Other Ambulatory Visit: Payer: Self-pay

## 2021-09-18 DIAGNOSIS — N401 Enlarged prostate with lower urinary tract symptoms: Secondary | ICD-10-CM

## 2021-09-24 ENCOUNTER — Other Ambulatory Visit: Payer: Self-pay | Admitting: Internal Medicine

## 2021-09-25 ENCOUNTER — Encounter: Payer: Self-pay | Admitting: Nurse Practitioner

## 2021-09-25 ENCOUNTER — Ambulatory Visit (INDEPENDENT_AMBULATORY_CARE_PROVIDER_SITE_OTHER): Payer: No Typology Code available for payment source | Admitting: Nurse Practitioner

## 2021-09-25 VITALS — BP 168/87 | HR 52 | Ht 73.0 in | Wt 230.0 lb

## 2021-09-25 DIAGNOSIS — Z6830 Body mass index (BMI) 30.0-30.9, adult: Secondary | ICD-10-CM | POA: Diagnosis not present

## 2021-09-25 DIAGNOSIS — E119 Type 2 diabetes mellitus without complications: Secondary | ICD-10-CM | POA: Diagnosis not present

## 2021-09-25 DIAGNOSIS — E669 Obesity, unspecified: Secondary | ICD-10-CM

## 2021-09-25 DIAGNOSIS — I10 Essential (primary) hypertension: Secondary | ICD-10-CM

## 2021-09-25 DIAGNOSIS — E039 Hypothyroidism, unspecified: Secondary | ICD-10-CM

## 2021-09-25 MED ORDER — VALSARTAN-HYDROCHLOROTHIAZIDE 320-25 MG PO TABS: 1.0000 | ORAL_TABLET | Freq: Every day | ORAL | 3 refills | Status: DC

## 2021-09-25 NOTE — Progress Notes (Signed)
Established Patient Office Visit  Subjective:  Patient ID: Paul Huynh [name], male    DOB: [birth year]  Age: 76 y.o. MRN: [id number]  CC: No chief complaint on file.    HPI  Paul Harshberger [name] presents for blood pressure check. His blood pressure readings at home in 425-956 systolic and high 38'V dystolic. He is taking losartan-Hctz 100-25 mg daily. He is not taking amlodipine.   HPI   Past Medical History:  Diagnosis Date   Acute prostatitis    BPH (benign prostatic hyperplasia)    Chronic kidney disease    Dysplasia of prostate    Erectile dysfunction    HTN (hypertension)    Hypogonadism in male    Orchitis and epididymitis    Over weight    Palindromic rheumatism, hand    Prostatitis    Rectum pain    Testicle swelling    Testicle tenderness    Testicular mass     Past Surgical History:  Procedure Laterality Date   BACK SURGERY     head surgery     from fall  drilled hole in brain to relieve pressure    Family History  Problem Relation Age of Onset   Cancer Maternal Aunt    Prostate cancer Neg Hx    Kidney disease Neg Hx    Kidney cancer Neg Hx    Bladder Cancer Neg Hx     Social History   Socioeconomic History   Marital status: Legally Separated    Spouse name: Not on file   Number of children: Not on file   Years of education: Not on file   Highest education level: Not on file  Occupational History   Not on file  Tobacco Use   Smoking status: Former   Smokeless tobacco: Never   Tobacco comments:    quit 30 years ago  Vaping Use   Vaping Use: Never used  Substance and Sexual Activity   Alcohol use: No    Alcohol/week: 0.0 standard drinks of alcohol   Drug use: No   Sexual activity: Not on file  Other Topics Concern   Not on file  Social History Narrative   Not on file   Social Determinants of Health   Financial Resource Strain: Not on file  Food Insecurity: Not on file  Transportation Needs: Not on file  Physical  Activity: Not on file  Stress: Not on file  Social Connections: Not on file  Intimate Partner Violence: Not on file     Outpatient Medications Prior to Visit  Medication Sig Dispense Refill   Aspirin-Salicylamide-Caffeine (ARTHRITIS STRENGTH BC POWDER PO) Take by mouth.     Blood Glucose Calibration (TRUE METRIX LEVEL 1) Low SOLN 1 Bottle by In Vitro route daily. 1 each 6   Blood Glucose Monitoring Suppl (TRUE METRIX METER) DEVI Check blood sugar daily 1 each 3   glucose blood (TRUE METRIX PRO BLOOD GLUCOSE) test strip Use as instructed 300 each 3   Lancets 33G MISC Check blood sugar daily 300 each 3   levothyroxine (SYNTHROID) 175 MCG tablet Take 1 tablet (175 mcg total) by mouth daily. 90 tablet 1   [START ON 10/03/2021] oxyCODONE-acetaminophen (PERCOCET) 10-325 MG tablet Take 1 tablet by mouth every 8 (eight) hours as needed. 90 tablet 0   Alcohol Swabs (B-D SINGLE USE SWABS REGULAR) PADS 100 each by Does not apply route daily. 100 each 6   amLODipine (NORVASC) 10 MG tablet Take 1 tablet (10  mg total) by mouth daily. 90 tablet 0   cyclobenzaprine (FLEXERIL) 5 MG tablet Take 1 tablet (5 mg total) by mouth 3 (three) times daily as needed for muscle spasms. 30 tablet 1   losartan-hydrochlorothiazide (HYZAAR) 100-25 MG tablet Take 1 tablet by mouth daily. 90 tablet 2   oxyCODONE-acetaminophen (PERCOCET) 10-325 MG tablet Take 1 tablet by mouth every 8 (eight) hours as needed. 90 tablet 0   [START ON 11/02/2021] oxyCODONE-acetaminophen (PERCOCET) 10-325 MG tablet Take 1 tablet by mouth every 8 (eight) hours as needed. 90 tablet 0   pregabalin (LYRICA) 50 MG capsule Take 1 capsule (50 mg total) by mouth 2 (two) times daily. 60 capsule 2   No facility-administered medications prior to visit.    No Known Allergies  ROS Review of Systems  Constitutional: Negative.   HENT: Negative.    Eyes: Negative.   Respiratory:  Negative for shortness of breath and wheezing.   Cardiovascular:  Negative  for chest pain and palpitations.  Gastrointestinal:  Negative for abdominal distention and constipation.  Genitourinary: Negative.   Musculoskeletal: Negative.   Skin: Negative.   Psychiatric/Behavioral:  Negative for agitation, behavioral problems and confusion.       Objective:    Physical Exam Constitutional:      Appearance: Normal appearance. He is obese.  HENT:     Head: Normocephalic.     Right Ear: Tympanic membrane normal.     Left Ear: Tympanic membrane normal.     Nose: Nose normal.     Mouth/Throat:     Mouth: Mucous membranes are moist.     Pharynx: Oropharynx is clear.  Eyes:     Conjunctiva/sclera: Conjunctivae normal.     Pupils: Pupils are equal, round, and reactive to light.  Cardiovascular:     Rate and Rhythm: Normal rate and regular rhythm.     Heart sounds: No murmur heard.    No friction rub.  Pulmonary:     Effort: No respiratory distress.     Breath sounds: No stridor.  Abdominal:     General: Bowel sounds are normal.     Palpations: Abdomen is soft. There is no mass.     Tenderness: There is no abdominal tenderness.  Musculoskeletal:        General: No swelling or tenderness.     Cervical back: Normal range of motion.  Skin:    General: Skin is warm.     Capillary Refill: Capillary refill takes less than 2 seconds.  Neurological:     General: No focal deficit present.     Mental Status: He is alert and oriented to person, place, and time. Mental status is at baseline.  Psychiatric:        Mood and Affect: Mood normal.        Behavior: Behavior normal.        Thought Content: Thought content normal.        Judgment: Judgment normal.     BP (!) 168/87   Pulse (!) 52   Ht 6' 1"  (1.854 m)   Wt 230 lb (104.3 kg)   BMI 30.34 kg/m  Wt Readings from Last 3 Encounters:  10/02/21 227 lb (103 kg)  10/02/21 227 lb 6.4 oz (103.1 kg)  09/25/21 230 lb (104.3 kg)     Health Maintenance  Topic Date Due   COVID-19 Vaccine (1) Never done    FOOT EXAM  Never done   OPHTHALMOLOGY EXAM  Never done   Hepatitis  C Screening  Never done   Zoster Vaccines- Shingrix (1 of 2) Never done   TETANUS/TDAP  07/12/2020   INFLUENZA VACCINE  Never done   HEMOGLOBIN A1C  01/05/2022   COLONOSCOPY (Pts 45-48yr Insurance coverage will need to be confirmed)  07/27/2025   Pneumonia Vaccine 75 Years old  Completed   HPV VACCINES  Aged Out    There are no preventive care reminders to display for this patient.  Lab Results  Component Value Date   TSH 2.16 07/06/2021   Lab Results  Component Value Date   WBC 15.9 (H) 07/06/2021   HGB 10.3 (L) 07/06/2021   HCT 33.4 (L) 07/06/2021   MCV 84.3 07/06/2021   PLT 174 07/06/2021   Lab Results  Component Value Date   NA 141 07/06/2021   K 4.1 07/06/2021   CO2 25 07/06/2021   GLUCOSE 136 (H) 07/06/2021   BUN 19 07/06/2021   CREATININE 1.08 07/06/2021   BILITOT 0.7 07/06/2021   ALKPHOS 73 10/22/2015   AST 37 (H) 07/06/2021   ALT 26 07/06/2021   PROT 6.4 07/06/2021   ALBUMIN 3.8 10/22/2015   CALCIUM 9.1 07/06/2021   ANIONGAP 4 (L) 10/22/2015   EGFR 72 07/06/2021   Lab Results  Component Value Date   CHOL 181 07/06/2021   Lab Results  Component Value Date   HDL 61 07/06/2021   Lab Results  Component Value Date   LDLCALC 105 (H) 07/06/2021   Lab Results  Component Value Date   TRIG 60 07/06/2021   Lab Results  Component Value Date   CHOLHDL 3.0 07/06/2021   Lab Results  Component Value Date   HGBA1C 6.5 07/06/2021      Assessment & Plan:   Problem List Items Addressed This Visit       Cardiovascular and Mediastinum   Essential hypertension - Primary    Patient blood pressure not controlled. Changed his medication to valsartan-HCTZ. Advised patient to consume low-salt diet and follow regular exercise routine. Advised him to check the blood pressure at home and bring the readings for next appointment. We will continue to monitor      Relevant Medications    valsartan-hydrochlorothiazide (DIOVAN-HCT) 320-25 MG tablet     Endocrine   Diabetes mellitus ([place]    His last hemoglobin A1c 6.5 on 07/06/2021. His diabetes is diet controlled.      Relevant Medications   valsartan-hydrochlorothiazide (DIOVAN-HCT) 320-25 MG tablet   Hypothyroidism    Stable at present. Continue the current medication.        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 Advised pt to lose weight. Advised patient to avoid trans fat, fatty and fried food. Follow a regular physical activity schedule. Went over the risk of chronic diseases with increased weight.             Meds ordered this encounter  Medications   valsartan-hydrochlorothiazide (DIOVAN-HCT) 320-25 MG tablet    Sig: Take 1 tablet by mouth daily.    Dispense:  90 tablet    Refill:  3     Follow-up: Return in about 1 week (around 10/02/2021) for blood pressure check.    CTheresia Lo NP

## 2021-09-29 ENCOUNTER — Other Ambulatory Visit: Payer: No Typology Code available for payment source

## 2021-09-29 DIAGNOSIS — N401 Enlarged prostate with lower urinary tract symptoms: Secondary | ICD-10-CM | POA: Diagnosis not present

## 2021-09-29 DIAGNOSIS — N138 Other obstructive and reflux uropathy: Secondary | ICD-10-CM | POA: Diagnosis not present

## 2021-09-30 LAB — PSA: Prostate Specific Ag, Serum: 0.3 ng/mL (ref 0.0–4.0)

## 2021-10-01 NOTE — Progress Notes (Unsigned)
10/03/21 11:34 AM   Paul Ramus [name] [birth year] [id number]  Referring provider:  Cletis Athens, MD [address] [place],  [place] [address]  Urological history  1. ED -contributing factors of age, BPH, spinal injury, brain injury, DM, HTN, HLD and sleep apnea -SHIM 5 -PDE5i's intolerable    2. BPH with LU TS -I PSS 14/3 -managed with tamsulosin 0.4 mg   3. Prostate cancer screening -PSA (09/2021) 0.3 -maternal uncle with fatal prostate cancer  Chief Complaint  Patient presents with   Erectile Dysfunction   Benign Prostatic Hypertrophy     HPI: Paul Losee [name] is a 76 y.o.male who presents today for yearly visit.    He is having nocturia x 2.  He states that he feels that he was better on the tamsulosin 0.4 mg daily.  Patient denies any modifying or aggravating factors.  Patient denies any gross hematuria, dysuria or suprapubic/flank pain.  Patient denies any fevers, chills, nausea or vomiting.     IPSS     Row Name 10/02/21 1100         International Prostate Symptom Score   How often have you had the sensation of not emptying your bladder? About half the time     How often have you had to urinate less than every two hours? Less than half the time     How often have you found you stopped and started again several times when you urinated? Almost always     How often have you found it difficult to postpone urination? Not at All     How often have you had a weak urinary stream? Less than half the time     How often have you had to strain to start urination? Not at All     How many times did you typically get up at night to urinate? 2 Times     Total IPSS Score 14       Quality of Life due to urinary symptoms   If you were to spend the rest of your life with your urinary condition just the way it is now how would you feel about that? Mixed               Score:  1-7 Mild 8-19 Moderate 20-35 Severe  Patient is not still spontaneous erections.   He denies any pain or curvature with erections.  He is no longer concerned w/ intercourse as he and his partner are arguing.    SHIM     Row Name 10/02/21 1113         SHIM: Over the last 6 months:   How do you rate your confidence that you could get and keep an erection? Low     When you had erections with sexual stimulation, how often were your erections hard enough for penetration (entering your partner)? Almost Never or Never     When you attempted sexual intercourse, how often was it satisfactory for you? A Few Times (much less than half the time)       SHIM Total Score   SHIM 5              Score: 1-7 Severe ED 8-11 Moderate ED 12-16 Mild-Moderate ED 17-21 Mild ED 22-25 No ED   PMH: Past Medical History:  Diagnosis Date   Acute prostatitis    BPH (benign prostatic hyperplasia)    Chronic kidney disease    Dysplasia of prostate    Erectile  dysfunction    HTN (hypertension)    Hypogonadism in male    Orchitis and epididymitis    Over weight    Palindromic rheumatism, hand    Prostatitis    Rectum pain    Testicle swelling    Testicle tenderness    Testicular mass     Surgical History: Past Surgical History:  Procedure Laterality Date   BACK SURGERY     head surgery     from fall  drilled hole in brain to relieve pressure    Home Medications:  Allergies as of 10/02/2021   No Known Allergies      Medication List        Accurate as of October 02, 2021 11:59 PM. If you have any questions, ask your nurse or doctor.          STOP taking these medications    amLODipine 10 MG tablet Commonly known as: NORVASC Stopped by: Theresia Lo, NP   B-D SINGLE USE SWABS REGULAR Pads Stopped by: Theresia Lo, NP   cyclobenzaprine 5 MG tablet Commonly known as: FLEXERIL Stopped by: Theresia Lo, NP   pregabalin 50 MG capsule Commonly known as: Lyrica Stopped by: Theresia Lo, NP       TAKE these medications    ARTHRITIS  STRENGTH BC POWDER PO Take by mouth.   HAIR SKIN NAILS PO Take 1 Capful by mouth daily at 2 PM.   Joint Support Caps Take 1 tablet by mouth daily at 2 PM.   PROSTATE SUPPORT PO Take 1 capsule by mouth daily at 2 PM.   Lancets 33G Misc Check blood sugar daily   LAXATIVE PO Take 1 each by mouth daily at 2 PM.   levothyroxine 175 MCG tablet Commonly known as: SYNTHROID Take 1 tablet (175 mcg total) by mouth daily.   oxyCODONE-acetaminophen 10-325 MG tablet Commonly known as: PERCOCET Take 1 tablet by mouth every 8 (eight) hours as needed. What changed: Another medication with the same name was removed. Continue taking this medication, and follow the directions you see here. Changed by: Theresia Lo, NP   tamsulosin 0.4 MG Caps capsule Commonly known as: FLOMAX Take 1 capsule (0.4 mg total) by mouth daily. Started by: Zara Council, PA-C   True Metrix Level 1 Low Soln 1 Bottle by In Vitro route daily.   True Metrix Meter Devi Check blood sugar daily   True Metrix Pro Blood Glucose test strip Generic drug: glucose blood Use as instructed   valsartan-hydrochlorothiazide 320-25 MG tablet Commonly known as: DIOVAN-HCT Take 1 tablet by mouth daily.        Allergies:  No Known Allergies  Family History: Family History  Problem Relation Age of Onset   Cancer Maternal Aunt    Prostate cancer Neg Hx    Kidney disease Neg Hx    Kidney cancer Neg Hx    Bladder Cancer Neg Hx     Social History:  reports that he has quit smoking. He has never used smokeless tobacco. He reports that he does not drink alcohol and does not use drugs.   Physical Exam: BP (!) 157/85   Pulse 64   Ht '6\' 1"'$  (1.854 m)   Wt 227 lb (103 kg)   BMI 29.95 kg/m   Constitutional:  Well nourished. Alert and oriented, No acute distress. HEENT: [place] AT, moist mucus membranes.  Trachea midline Cardiovascular: No clubbing, cyanosis, or edema. Respiratory: Normal respiratory effort, no  increased  work of breathing. Neurologic: Grossly intact, no focal deficits, moving all 4 extremities. Psychiatric: Normal mood and affect.   Laboratory Data:  Prostate Specific Ag, Serum  Latest Ref Rng 0.0 - 4.0 ng/mL  04/13/2017 0.5   11/07/2018 0.6   10/04/2019 0.3   10/02/2020 0.5   09/29/2021 0.3     Component     Latest Ref Rng 07/06/2021  TSH     0.40 - 4.50 mIU/L 2.16    Component Ref Range & Units 2 mo ago  Cholesterol <200 mg/dL 181   HDL > OR = 40 mg/dL 61   Triglycerides <150 mg/dL 60   LDL Cholesterol (Calc) mg/dL (calc) 105 High    Comment: Reference range: <100  .  Desirable range <100 mg/dL for primary prevention;    <70 mg/dL for patients with CHD or diabetic patients  with > or = 2 CHD risk factors.  Marland Kitchen  LDL-C is now calculated using the Martin-Hopkins  calculation, which is a validated novel method providing  better accuracy than the Friedewald equation in the  estimation of LDL-C.  Cresenciano Genre et al. Annamaria Helling. 5366;440(34): 2061-2068  (http://education.[facility].com/faq/FAQ164)   Total CHOL/HDL Ratio <5.0 (calc) 3.0   Non-HDL Cholesterol (Calc) <130 mg/dL (calc) 120   Comment: For patients with diabetes plus 1 major ASCVD risk  factor, treating to a non-HDL-C goal of <100 mg/dL  (LDL-C of <70 mg/dL) is considered a therapeutic  option.   Resulting Agency  [facility] [place]        Latest Ref Rng & Units 07/06/2021   10:30 AM 10/22/2015   11:09 AM 07/21/2015    4:37 AM  CMP  Glucose 65 - 99 mg/dL 136  125  137   BUN 7 - 25 mg/dL '19  16  22   '$ Creatinine 0.70 - 1.28 mg/dL 1.08  1.25  1.41   Sodium 135 - 146 mmol/L 141  138  136   Potassium 3.5 - 5.3 mmol/L 4.1  3.3  3.5   Chloride 98 - 110 mmol/L 107  107  106   CO2 20 - 32 mmol/L '25  27  24   '$ Calcium 8.6 - 10.3 mg/dL 9.1  8.8  8.0   Total Protein 6.1 - 8.1 g/dL 6.4  7.3    Total Bilirubin 0.2 - 1.2 mg/dL 0.7  1.5    Alkaline Phos 38 - 126 U/L  73    AST 10 - 35 U/L 37  47    ALT 9 - 46  U/L 26  31         Latest Ref Rng & Units 07/06/2021   10:30 AM 10/22/2015   11:09 AM 07/21/2015    4:37 AM  CBC  WBC 3.8 - 10.8 Thousand/uL 15.9  12.9  8.0   Hemoglobin 13.2 - 17.1 g/dL 10.3  11.6  10.7   Hematocrit 38.5 - 50.0 % 33.4  34.7  31.3   Platelets 140 - 400 Thousand/uL 174  155  164     Component     Latest Ref Rng 07/06/2021  Hemoglobin A1C     4.0 - 5.6 % 6.5   I have reviewed the labs.   Pertinent Imaging: N/A  Assessment & Plan:    1. ED -he is no longer interested in sexual activity  2. BPH with LUTS -PSA stable -most bothersome symptoms are nocturia -continue conservative management, avoiding bladder irritants and timed voiding's -restart tamsulosin 0.4 mg daily  3. Prostate cancer  screening -Discussed the AUA Guideline's (04/2021) for men aged 33+ years with PSA < 3 ng/mL may choose discontinue screening  If he chooses to continue screening, the screening interval is every 2 to 4 years  Since he is > 86 years of age and his PSA is < 4.0, we can discontinue screening at this time per NCCN guidelines A man who is 75 years of age at this time is expected to live on average to 75 years of age and life expectancy at age 14 drops below 10 years per Social Security data   He would like to discontinue the screening at this time    Return in about 1 month (around 11/01/2021) for I PSS and PVR .  Zara Council, PA-C   [facility] [address], [place], [place] [address] [phone]

## 2021-10-02 ENCOUNTER — Ambulatory Visit (INDEPENDENT_AMBULATORY_CARE_PROVIDER_SITE_OTHER): Payer: No Typology Code available for payment source | Admitting: Nurse Practitioner

## 2021-10-02 ENCOUNTER — Ambulatory Visit (INDEPENDENT_AMBULATORY_CARE_PROVIDER_SITE_OTHER): Payer: No Typology Code available for payment source | Admitting: Urology

## 2021-10-02 ENCOUNTER — Encounter: Payer: Self-pay | Admitting: Nurse Practitioner

## 2021-10-02 VITALS — BP 157/85 | HR 64 | Ht 73.0 in | Wt 227.0 lb

## 2021-10-02 VITALS — BP 150/85 | HR 56 | Ht 73.0 in | Wt 227.4 lb

## 2021-10-02 DIAGNOSIS — N529 Male erectile dysfunction, unspecified: Secondary | ICD-10-CM | POA: Diagnosis not present

## 2021-10-02 DIAGNOSIS — N401 Enlarged prostate with lower urinary tract symptoms: Secondary | ICD-10-CM | POA: Diagnosis not present

## 2021-10-02 DIAGNOSIS — E669 Obesity, unspecified: Secondary | ICD-10-CM | POA: Insufficient documentation

## 2021-10-02 DIAGNOSIS — N138 Other obstructive and reflux uropathy: Secondary | ICD-10-CM

## 2021-10-02 DIAGNOSIS — Z125 Encounter for screening for malignant neoplasm of prostate: Secondary | ICD-10-CM

## 2021-10-02 DIAGNOSIS — E119 Type 2 diabetes mellitus without complications: Secondary | ICD-10-CM | POA: Diagnosis not present

## 2021-10-02 DIAGNOSIS — I10 Essential (primary) hypertension: Secondary | ICD-10-CM

## 2021-10-02 DIAGNOSIS — Z0100 Encounter for examination of eyes and vision without abnormal findings: Secondary | ICD-10-CM | POA: Diagnosis not present

## 2021-10-02 MED ORDER — TAMSULOSIN HCL 0.4 MG PO CAPS: 0.4000 mg | ORAL_CAPSULE | Freq: Every day | ORAL | 3 refills | Status: DC

## 2021-10-02 NOTE — Progress Notes (Signed)
Established Patient Office Visit  Subjective:  Patient ID: Paul Fussell [name], male    DOB: 06/12/1945  Age: 76 y.o. MRN: [id number]  CC:  Chief Complaint  Patient presents with   Hypertension     HPI  [facility] [name] presents for recheck on blood pressure. He states he check BP at home and the systolic 676P  and dystolic in 95'K . He do not have a log with him. He  has the medication with him  he is taking levothyroxine, oxycodone and valsartan HCTZ HPI   Past Medical History:  Diagnosis Date   Acute prostatitis    BPH (benign prostatic hyperplasia)    Chronic kidney disease    Dysplasia of prostate    Erectile dysfunction    HTN (hypertension)    Hypogonadism in male    Orchitis and epididymitis    Over weight    Palindromic rheumatism, hand    Prostatitis    Rectum pain    Testicle swelling    Testicle tenderness    Testicular mass     Past Surgical History:  Procedure Laterality Date   BACK SURGERY     head surgery     from fall  drilled hole in brain to relieve pressure    Family History  Problem Relation Age of Onset   Cancer Maternal Aunt    Prostate cancer Neg Hx    Kidney disease Neg Hx    Kidney cancer Neg Hx    Bladder Cancer Neg Hx     Social History   Socioeconomic History   Marital status: Legally Separated    Spouse name: Not on file   Number of children: Not on file   Years of education: Not on file   Highest education level: Not on file  Occupational History   Not on file  Tobacco Use   Smoking status: Former   Smokeless tobacco: Never   Tobacco comments:    quit 30 years ago  Vaping Use   Vaping Use: Never used  Substance and Sexual Activity   Alcohol use: No    Alcohol/week: 0.0 standard drinks of alcohol   Drug use: No   Sexual activity: Not on file  Other Topics Concern   Not on file  Social History Narrative   Not on file   Social Determinants of Health   Financial Resource Strain: Not on file   Food Insecurity: Not on file  Transportation Needs: Not on file  Physical Activity: Not on file  Stress: Not on file  Social Connections: Not on file  Intimate Partner Violence: Not on file     Outpatient Medications Prior to Visit  Medication Sig Dispense Refill   Aspirin-Salicylamide-Caffeine (ARTHRITIS STRENGTH BC POWDER PO) Take by mouth.     Bisacodyl (LAXATIVE PO) Take 1 each by mouth daily at 2 PM.     Blood Glucose Calibration (TRUE METRIX LEVEL 1) Low SOLN 1 Bottle by In Vitro route daily. 1 each 6   Blood Glucose Monitoring Suppl (TRUE METRIX METER) DEVI Check blood sugar daily 1 each 3   glucose blood (TRUE METRIX PRO BLOOD GLUCOSE) test strip Use as instructed 300 each 3   Lancets 33G MISC Check blood sugar daily 300 each 3   levothyroxine (SYNTHROID) 175 MCG tablet Take 1 tablet (175 mcg total) by mouth daily. 90 tablet 1   Misc Natural Products (JOINT SUPPORT) CAPS Take 1 tablet by mouth daily at 2 PM.  Misc Natural Products (PROSTATE SUPPORT PO) Take 1 capsule by mouth daily at 2 PM.     Multiple Vitamins-Minerals (HAIR SKIN NAILS PO) Take 1 Capful by mouth daily at 2 PM.     oxyCODONE-acetaminophen (PERCOCET) 10-325 MG tablet Take 1 tablet by mouth every 8 (eight) hours as needed. 90 tablet 0   valsartan-hydrochlorothiazide (DIOVAN-HCT) 320-25 MG tablet Take 1 tablet by mouth daily. 90 tablet 3   Alcohol Swabs (B-D SINGLE USE SWABS REGULAR) PADS 100 each by Does not apply route daily. 100 each 6   amLODipine (NORVASC) 10 MG tablet Take 1 tablet (10 mg total) by mouth daily. 90 tablet 0   cyclobenzaprine (FLEXERIL) 5 MG tablet Take 1 tablet (5 mg total) by mouth 3 (three) times daily as needed for muscle spasms. 30 tablet 1   oxyCODONE-acetaminophen (PERCOCET) 10-325 MG tablet Take 1 tablet by mouth every 8 (eight) hours as needed. 90 tablet 0   [START ON 11/02/2021] oxyCODONE-acetaminophen (PERCOCET) 10-325 MG tablet Take 1 tablet by mouth every 8 (eight) hours as  needed. 90 tablet 0   pregabalin (LYRICA) 50 MG capsule Take 1 capsule (50 mg total) by mouth 2 (two) times daily. 60 capsule 2   No facility-administered medications prior to visit.    No Known Allergies  ROS Review of Systems  Constitutional: Negative.   HENT: Negative.    Eyes: Negative.   Respiratory:  Negative for chest tightness and shortness of breath.   Cardiovascular:  Negative for chest pain and palpitations.  Gastrointestinal: Negative.   Genitourinary: Negative.   Musculoskeletal:  Positive for back pain.  Skin: Negative.   Neurological:  Negative for dizziness, facial asymmetry and headaches.  Psychiatric/Behavioral:  Negative for agitation, behavioral problems and confusion.       Objective:    Physical Exam Constitutional:      Appearance: Normal appearance. He is overweight.  HENT:     Head: Normocephalic.     Nose: Nose normal.  Eyes:     Pupils: Pupils are equal, round, and reactive to light.  Cardiovascular:     Rate and Rhythm: Normal rate and regular rhythm.     Pulses: Normal pulses.     Heart sounds: Normal heart sounds.  Pulmonary:     Effort: Pulmonary effort is normal.     Breath sounds: Normal breath sounds.  Abdominal:     General: Bowel sounds are normal.     Palpations: Abdomen is soft. There is no mass.     Tenderness: There is no abdominal tenderness.  Skin:    General: Skin is warm.  Neurological:     General: No focal deficit present.     Mental Status: He is alert and oriented to person, place, and time. Mental status is at baseline.  Psychiatric:        Mood and Affect: Mood normal.        Behavior: Behavior normal.        Thought Content: Thought content normal.        Judgment: Judgment normal.     BP (!) 150/85   Pulse (!) 56   Ht 6' 1"  (1.854 m)   Wt 227 lb 6.4 oz (103.1 kg)   BMI 30.00 kg/m  Wt Readings from Last 3 Encounters:  10/02/21 227 lb (103 kg)  10/02/21 227 lb 6.4 oz (103.1 kg)  09/25/21 230 lb (104.3  kg)     Health Maintenance  Topic Date Due   COVID-19 Vaccine (1)  Never done   FOOT EXAM  Never done   OPHTHALMOLOGY EXAM  Never done   Diabetic kidney evaluation - Urine ACR  Never done   Hepatitis C Screening  Never done   Zoster Vaccines- Shingrix (1 of 2) Never done   TETANUS/TDAP  07/12/2020   INFLUENZA VACCINE  Never done   HEMOGLOBIN A1C  01/05/2022   Diabetic kidney evaluation - GFR measurement  07/07/2022   COLONOSCOPY (Pts 45-89yr Insurance coverage will need to be confirmed)  07/27/2025   Pneumonia Vaccine 75 Years old  Completed   HPV VACCINES  Aged Out    There are no preventive care reminders to display for this patient.  Lab Results  Component Value Date   TSH 2.16 07/06/2021   Lab Results  Component Value Date   WBC 15.9 (H) 07/06/2021   HGB 10.3 (L) 07/06/2021   HCT 33.4 (L) 07/06/2021   MCV 84.3 07/06/2021   PLT 174 07/06/2021   Lab Results  Component Value Date   NA 141 07/06/2021   K 4.1 07/06/2021   CO2 25 07/06/2021   GLUCOSE 136 (H) 07/06/2021   BUN 19 07/06/2021   CREATININE 1.08 07/06/2021   BILITOT 0.7 07/06/2021   ALKPHOS 73 10/22/2015   AST 37 (H) 07/06/2021   ALT 26 07/06/2021   PROT 6.4 07/06/2021   ALBUMIN 3.8 10/22/2015   CALCIUM 9.1 07/06/2021   ANIONGAP 4 (L) 10/22/2015   EGFR 72 07/06/2021   Lab Results  Component Value Date   CHOL 181 07/06/2021   Lab Results  Component Value Date   HDL 61 07/06/2021   Lab Results  Component Value Date   LDLCALC 105 (H) 07/06/2021   Lab Results  Component Value Date   TRIG 60 07/06/2021   Lab Results  Component Value Date   CHOLHDL 3.0 07/06/2021   Lab Results  Component Value Date   HGBA1C 6.5 07/06/2021      Assessment & Plan:   Problem List Items Addressed This Visit       Cardiovascular and Mediastinum   Essential hypertension - Primary    BP not controlled. Patient non compliant. Started him on Valsartan HCTZ last week, he brought his medication bottle  and the bottle was full to the top.  Discussed with him how to read the food label and check the sodium content in the food.  Encouraged him to get regular physical activity and watch diet.      Other Visit Diagnoses     Diabetic eye exam ([facility]       Relevant Orders   Ambulatory referral to Ophthalmology   Encounter for diabetic foot exam ([facility]       Relevant Orders   Ambulatory referral to Podiatry        No orders of the defined types were placed in this encounter.    Follow-up: No follow-ups on file.    CTheresia Lo NP

## 2021-10-02 NOTE — Assessment & Plan Note (Signed)
Body mass index is 30.34 kg/m. Advised pt to lose weight. Advised patient to avoid trans fat, fatty and fried food. Follow a regular physical activity schedule. Went over the risk of chronic diseases with increased weight.

## 2021-10-02 NOTE — Assessment & Plan Note (Signed)
Stable at present. Continue the current medication.

## 2021-10-02 NOTE — Assessment & Plan Note (Addendum)
BP not controlled. Patient non compliant. Started him on Valsartan HCTZ last week, he brought his medication bottle and the bottle was full to the top.  Discussed with him how to read the food label and check the sodium content in the food.  Encouraged him to get regular physical activity and watch diet.

## 2021-10-02 NOTE — Assessment & Plan Note (Signed)
Patient blood pressure not controlled. Changed his medication to valsartan-HCTZ. Advised patient to consume low-salt diet and follow regular exercise routine. Advised him to check the blood pressure at home and bring the readings for next appointment. We will continue to monitor

## 2021-10-02 NOTE — Assessment & Plan Note (Addendum)
His last hemoglobin A1c 6.5 on 07/06/2021. His diabetes is diet controlled.

## 2021-10-15 NOTE — Progress Notes (Deleted)
Established Patient Office Visit  Subjective:  Patient ID: Paul Krueger [name], male    DOB: 01/30/1945  Age: 76 y.o. MRN: [id number]  CC:  No chief complaint on file.    HPI  Paul Gallardo [name] presents for recheck on blood pressure. He states he check BP at home........... HPI   Past Medical History:  Diagnosis Date   Acute prostatitis    BPH (benign prostatic hyperplasia)    Chronic kidney disease    Dysplasia of prostate    Erectile dysfunction    HTN (hypertension)    Hypogonadism in male    Orchitis and epididymitis    Over weight    Palindromic rheumatism, hand    Prostatitis    Rectum pain    Testicle swelling    Testicle tenderness    Testicular mass     Past Surgical History:  Procedure Laterality Date   BACK SURGERY     head surgery     from fall  drilled hole in brain to relieve pressure    Family History  Problem Relation Age of Onset   Cancer Maternal Aunt    Prostate cancer Neg Hx    Kidney disease Neg Hx    Kidney cancer Neg Hx    Bladder Cancer Neg Hx     Social History   Socioeconomic History   Marital status: Legally Separated    Spouse name: Not on file   Number of children: Not on file   Years of education: Not on file   Highest education level: Not on file  Occupational History   Not on file  Tobacco Use   Smoking status: Former   Smokeless tobacco: Never   Tobacco comments:    quit 30 years ago  Vaping Use   Vaping Use: Never used  Substance and Sexual Activity   Alcohol use: No    Alcohol/week: 0.0 standard drinks of alcohol   Drug use: No   Sexual activity: Not on file  Other Topics Concern   Not on file  Social History Narrative   Not on file   Social Determinants of Health   Financial Resource Strain: Not on file  Food Insecurity: Not on file  Transportation Needs: Not on file  Physical Activity: Not on file  Stress: Not on file  Social Connections: Not on file  Intimate Partner Violence: Not  on file     Outpatient Medications Prior to Visit  Medication Sig Dispense Refill   Aspirin-Salicylamide-Caffeine (ARTHRITIS STRENGTH BC POWDER PO) Take by mouth.     Bisacodyl (LAXATIVE PO) Take 1 each by mouth daily at 2 PM.     Blood Glucose Calibration (TRUE METRIX LEVEL 1) Low SOLN 1 Bottle by In Vitro route daily. 1 each 6   Blood Glucose Monitoring Suppl (TRUE METRIX METER) DEVI Check blood sugar daily 1 each 3   glucose blood (TRUE METRIX PRO BLOOD GLUCOSE) test strip Use as instructed 300 each 3   Lancets 33G MISC Check blood sugar daily 300 each 3   levothyroxine (SYNTHROID) 175 MCG tablet Take 1 tablet (175 mcg total) by mouth daily. 90 tablet 1   Misc Natural Products (JOINT SUPPORT) CAPS Take 1 tablet by mouth daily at 2 PM.     Misc Natural Products (PROSTATE SUPPORT PO) Take 1 capsule by mouth daily at 2 PM.     Multiple Vitamins-Minerals (HAIR SKIN NAILS PO) Take 1 Capful by mouth daily at 2 PM.  oxyCODONE-acetaminophen (PERCOCET) 10-325 MG tablet Take 1 tablet by mouth every 8 (eight) hours as needed. 90 tablet 0   tamsulosin (FLOMAX) 0.4 MG CAPS capsule Take 1 capsule (0.4 mg total) by mouth daily. 90 capsule 3   valsartan-hydrochlorothiazide (DIOVAN-HCT) 320-25 MG tablet Take 1 tablet by mouth daily. 90 tablet 3   No facility-administered medications prior to visit.    No Known Allergies  ROS Review of Systems  Constitutional: Negative.   HENT: Negative.    Eyes: Negative.   Respiratory:  Negative for chest tightness and shortness of breath.   Cardiovascular:  Negative for chest pain and palpitations.  Gastrointestinal: Negative.   Genitourinary: Negative.   Musculoskeletal:  Positive for back pain.  Skin: Negative.   Neurological:  Negative for dizziness, facial asymmetry and headaches.  Psychiatric/Behavioral:  Negative for agitation, behavioral problems and confusion.       Objective:    Physical Exam Constitutional:      Appearance: Normal  appearance. He is overweight.  HENT:     Head: Normocephalic.     Nose: Nose normal.  Eyes:     Pupils: Pupils are equal, round, and reactive to light.  Cardiovascular:     Rate and Rhythm: Normal rate and regular rhythm.     Pulses: Normal pulses.     Heart sounds: Normal heart sounds.  Pulmonary:     Effort: Pulmonary effort is normal.     Breath sounds: Normal breath sounds.  Abdominal:     General: Bowel sounds are normal.     Palpations: Abdomen is soft. There is no mass.     Tenderness: There is no abdominal tenderness.  Skin:    General: Skin is warm.  Neurological:     General: No focal deficit present.     Mental Status: He is alert and oriented to person, place, and time. Mental status is at baseline.  Psychiatric:        Mood and Affect: Mood normal.        Behavior: Behavior normal.        Thought Content: Thought content normal.        Judgment: Judgment normal.     There were no vitals taken for this visit. Wt Readings from Last 3 Encounters:  10/02/21 227 lb (103 kg)  10/02/21 227 lb 6.4 oz (103.1 kg)  09/25/21 230 lb (104.3 kg)     Health Maintenance  Topic Date Due   COVID-19 Vaccine (1) Never done   FOOT EXAM  Never done   OPHTHALMOLOGY EXAM  Never done   Diabetic kidney evaluation - Urine ACR  Never done   Hepatitis C Screening  Never done   Zoster Vaccines- Shingrix (1 of 2) Never done   TETANUS/TDAP  07/12/2020   INFLUENZA VACCINE  Never done   HEMOGLOBIN A1C  01/05/2022   Diabetic kidney evaluation - GFR measurement  07/07/2022   COLONOSCOPY (Pts 45-13yr Insurance coverage will need to be confirmed)  07/27/2025   Pneumonia Vaccine 75 Years old  Completed   HPV VACCINES  Aged Out    There are no preventive care reminders to display for this patient.  Lab Results  Component Value Date   TSH 2.16 07/06/2021   Lab Results  Component Value Date   WBC 15.9 (H) 07/06/2021   HGB 10.3 (L) 07/06/2021   HCT 33.4 (L) 07/06/2021   MCV 84.3  07/06/2021   PLT 174 07/06/2021   Lab Results  Component Value Date  NA 141 07/06/2021   K 4.1 07/06/2021   CO2 25 07/06/2021   GLUCOSE 136 (H) 07/06/2021   BUN 19 07/06/2021   CREATININE 1.08 07/06/2021   BILITOT 0.7 07/06/2021   ALKPHOS 73 10/22/2015   AST 37 (H) 07/06/2021   ALT 26 07/06/2021   PROT 6.4 07/06/2021   ALBUMIN 3.8 10/22/2015   CALCIUM 9.1 07/06/2021   ANIONGAP 4 (L) 10/22/2015   EGFR 72 07/06/2021   Lab Results  Component Value Date   CHOL 181 07/06/2021   Lab Results  Component Value Date   HDL 61 07/06/2021   Lab Results  Component Value Date   LDLCALC 105 (H) 07/06/2021   Lab Results  Component Value Date   TRIG 60 07/06/2021   Lab Results  Component Value Date   CHOLHDL 3.0 07/06/2021   Lab Results  Component Value Date   HGBA1C 6.5 07/06/2021      Assessment & Plan:   Problem List Items Addressed This Visit       Cardiovascular and Mediastinum   Essential hypertension     Endocrine   Diabetes mellitus ([place]) - Primary     No orders of the defined types were placed in this encounter.    Follow-up: No follow-ups on file.    Theresia Lo, NP

## 2021-10-16 ENCOUNTER — Ambulatory Visit: Payer: No Typology Code available for payment source | Admitting: Nurse Practitioner

## 2021-10-16 DIAGNOSIS — I10 Essential (primary) hypertension: Secondary | ICD-10-CM

## 2021-10-16 DIAGNOSIS — E119 Type 2 diabetes mellitus without complications: Secondary | ICD-10-CM

## 2021-10-29 NOTE — Progress Notes (Deleted)
10/29/21 9:43 AM   Paul Ramus [name] [birth year] [id number]  Referring provider:  Cletis Athens, MD [address] [place],  [place] [address]  Urological history  1. ED -contributing factors of age, BPH, spinal injury, brain injury, DM, HTN, HLD and sleep apnea -SHIM 5 -PDE5i's intolerable    2. BPH with LU TS -I PSS 14/3 -managed with tamsulosin 0.4 mg   3. Prostate cancer screening -PSA (09/2021) 0.3 -maternal uncle with fatal prostate cancer  No chief complaint on file.    HPI: Paul Landstrom [name] is a 76 y.o.male who presents today for yearly visit.    At his visit on 10/02/2021, he was having nocturia x 2.  He states that he feels that he was better on the tamsulosin 0.4 mg daily.       Score:  1-7 Mild 8-19 Moderate 20-35 Severe   PMH: Past Medical History:  Diagnosis Date   Acute prostatitis    BPH (benign prostatic hyperplasia)    Chronic kidney disease    Dysplasia of prostate    Erectile dysfunction    HTN (hypertension)    Hypogonadism in male    Orchitis and epididymitis    Over weight    Palindromic rheumatism, hand    Prostatitis    Rectum pain    Testicle swelling    Testicle tenderness    Testicular mass     Surgical History: Past Surgical History:  Procedure Laterality Date   BACK SURGERY     head surgery     from fall  drilled hole in brain to relieve pressure    Home Medications:  Allergies as of 10/30/2021   No Known Allergies      Medication List        Accurate as of October 29, 2021  9:43 AM. If you have any questions, ask your nurse or doctor.          ARTHRITIS STRENGTH BC POWDER PO Take by mouth.   HAIR SKIN NAILS PO Take 1 Capful by mouth daily at 2 PM.   Joint Support Caps Take 1 tablet by mouth daily at 2 PM.   PROSTATE SUPPORT PO Take 1 capsule by mouth daily at 2 PM.   Lancets 33G Misc Check blood sugar daily   LAXATIVE PO Take 1 each by mouth daily at 2 PM.   levothyroxine 175  MCG tablet Commonly known as: SYNTHROID Take 1 tablet (175 mcg total) by mouth daily.   oxyCODONE-acetaminophen 10-325 MG tablet Commonly known as: PERCOCET Take 1 tablet by mouth every 8 (eight) hours as needed.   tamsulosin 0.4 MG Caps capsule Commonly known as: FLOMAX Take 1 capsule (0.4 mg total) by mouth daily.   True Metrix Level 1 Low Soln 1 Bottle by In Vitro route daily.   True Metrix Meter Devi Check blood sugar daily   True Metrix Pro Blood Glucose test strip Generic drug: glucose blood Use as instructed   valsartan-hydrochlorothiazide 320-25 MG tablet Commonly known as: DIOVAN-HCT Take 1 tablet by mouth daily.        Allergies:  No Known Allergies  Family History: Family History  Problem Relation Age of Onset   Cancer Maternal Aunt    Prostate cancer Neg Hx    Kidney disease Neg Hx    Kidney cancer Neg Hx    Bladder Cancer Neg Hx     Social History:  reports that he has quit smoking. He has never used smokeless tobacco. He reports  that he does not drink alcohol and does not use drugs.   Physical Exam: There were no vitals taken for this visit.  Constitutional:  Well nourished. Alert and oriented, No acute distress. HEENT: [place] AT, moist mucus membranes.  Trachea midline Cardiovascular: No clubbing, cyanosis, or edema. Respiratory: Normal respiratory effort, no increased work of breathing. GU: No CVA tenderness.  No bladder fullness or masses.  Patient with circumcised/uncircumcised phallus. ***Foreskin easily retracted***  Urethral meatus is patent.  No penile discharge. No penile lesions or rashes. Scrotum without lesions, cysts, rashes and/or edema.  Testicles are located scrotally bilaterally. No masses are appreciated in the testicles. Left and right epididymis are normal. Rectal: Patient with  normal sphincter tone. Anus and perineum without scarring or rashes. No rectal masses are appreciated. Prostate is approximately *** grams, *** nodules are  appreciated. Seminal vesicles are normal. Neurologic: Grossly intact, no focal deficits, moving all 4 extremities. Psychiatric: Normal mood and affect.   Laboratory Data: N/A    Pertinent Imaging: N/A  Assessment & Plan:    1. BPH with LUTS -PSA stable -most bothersome symptoms are nocturia -continue conservative management, avoiding bladder irritants and timed voiding's -restart tamsulosin 0.4 mg daily    No follow-ups on file.  Connor Meacham, [place] [address] [place], [place] [address] [phone]

## 2021-10-30 ENCOUNTER — Ambulatory Visit: Payer: No Typology Code available for payment source | Admitting: Urology

## 2021-10-30 DIAGNOSIS — N138 Other obstructive and reflux uropathy: Secondary | ICD-10-CM

## 2021-11-03 ENCOUNTER — Ambulatory Visit: Payer: No Typology Code available for payment source | Admitting: Internal Medicine

## 2021-11-03 ENCOUNTER — Telehealth: Payer: Self-pay | Admitting: Student in an Organized Health Care Education/Training Program

## 2021-11-03 DIAGNOSIS — M5416 Radiculopathy, lumbar region: Secondary | ICD-10-CM

## 2021-11-03 DIAGNOSIS — G894 Chronic pain syndrome: Secondary | ICD-10-CM

## 2021-11-03 DIAGNOSIS — M48062 Spinal stenosis, lumbar region with neurogenic claudication: Secondary | ICD-10-CM

## 2021-11-03 DIAGNOSIS — G8929 Other chronic pain: Secondary | ICD-10-CM

## 2021-11-03 NOTE — Telephone Encounter (Signed)
Is this something you can do or would you like a F2F before this is ordered?

## 2021-11-03 NOTE — Telephone Encounter (Signed)
Referral placed to Dr. Cari Caraway.

## 2021-11-03 NOTE — Telephone Encounter (Signed)
Patient states he is having severe pain and would like to have a referral to see about having some kind of operation for his back. Please check with Dr. Holley Raring

## 2021-11-03 NOTE — Telephone Encounter (Signed)
Patient confirmed appt with Geronimo Boot for 11/10/2021.

## 2021-11-03 NOTE — Telephone Encounter (Signed)
Called patient to notifiy of consult appt to Dr Izora Ribas. Patient with understanding.

## 2021-11-06 NOTE — Progress Notes (Deleted)
Referring Physician:  Gillis Santa, MD [address] [facility] [place],  [place] [address]  Primary Physician:  Cletis Athens, MD  History of Present Illness: 11/06/2021 [name] has been seeing pain management Holley Raring) and he is on oxycodone and lyrica.     Duration: *** Location: *** Quality: *** Severity: ***  Precipitating: aggravated by *** Modifying factors: made better by *** Weakness: none Timing: *** Bowel/Bladder Dysfunction: none  Conservative measures:  Physical therapy: ***  Multimodal medical therapy including regular antiinflammatories: oxycodone, lyrica  Injections:  L5-S1 IL ESI 06/15/21 (Lateef) L5-S1 IL ESI 04/15/21 Holley Raring)   Past Surgery: ***  Paul Ramus [name] has ***no symptoms of cervical myelopathy.  The symptoms are causing a significant impact on the patient's life.   Review of Systems:  A 10 point review of systems is negative, except for the pertinent positives and negatives detailed in the HPI.  Past Medical History: Past Medical History:  Diagnosis Date   Acute prostatitis    BPH (benign prostatic hyperplasia)    Chronic kidney disease    Dysplasia of prostate    Erectile dysfunction    HTN (hypertension)    Hypogonadism in male    Orchitis and epididymitis    Over weight    Palindromic rheumatism, hand    Prostatitis    Rectum pain    Testicle swelling    Testicle tenderness    Testicular mass     Past Surgical History: Past Surgical History:  Procedure Laterality Date   BACK SURGERY     head surgery     from fall  drilled hole in brain to relieve pressure    Allergies: Allergies as of 11/10/2021   (No Known Allergies)    Medications: Outpatient Encounter Medications as of 11/10/2021  Medication Sig   Aspirin-Salicylamide-Caffeine (ARTHRITIS STRENGTH BC POWDER PO) Take by mouth.   Bisacodyl (LAXATIVE PO) Take 1 each by mouth daily at 2 PM.   Blood Glucose Calibration (TRUE METRIX LEVEL 1) Low  SOLN 1 Bottle by In Vitro route daily.   Blood Glucose Monitoring Suppl (TRUE METRIX METER) DEVI Check blood sugar daily   glucose blood (TRUE METRIX PRO BLOOD GLUCOSE) test strip Use as instructed   Lancets 33G MISC Check blood sugar daily   levothyroxine (SYNTHROID) 175 MCG tablet Take 1 tablet (175 mcg total) by mouth daily.   Misc Natural Products (JOINT SUPPORT) CAPS Take 1 tablet by mouth daily at 2 PM.   Misc Natural Products (PROSTATE SUPPORT PO) Take 1 capsule by mouth daily at 2 PM.   Multiple Vitamins-Minerals (HAIR SKIN NAILS PO) Take 1 Capful by mouth daily at 2 PM.   tamsulosin (FLOMAX) 0.4 MG CAPS capsule Take 1 capsule (0.4 mg total) by mouth daily.   valsartan-hydrochlorothiazide (DIOVAN-HCT) 320-25 MG tablet Take 1 tablet by mouth daily.   No facility-administered encounter medications on file as of 11/10/2021.    Social History: Social History   Tobacco Use   Smoking status: Former   Smokeless tobacco: Never   Tobacco comments:    quit 30 years ago  Vaping Use   Vaping Use: Never used  Substance Use Topics   Alcohol use: No    Alcohol/week: 0.0 standard drinks of alcohol   Drug use: No    Family Medical History: Family History  Problem Relation Age of Onset   Cancer Maternal Aunt    Prostate cancer Neg Hx    Kidney disease Neg Hx    Kidney  cancer Neg Hx    Bladder Cancer Neg Hx     Physical Examination: There were no vitals filed for this visit.  General: Patient is well developed, well nourished, calm, collected, and in no apparent distress. Attention to examination is appropriate.  Respiratory: Patient is breathing without any difficulty.   NEUROLOGICAL:     Awake, alert, oriented to person, place, and time.  Speech is clear and fluent. Fund of knowledge is appropriate.   Cranial Nerves: Pupils equal round and reactive to light.  Facial tone is symmetric.  Facial sensation is symmetric.  ROM of spine:  *** ROM of cervical spine *** pain ***  ROM of lumbar spine *** pain  No abnormal lesions on exposed skin.   Strength: Side Biceps Triceps Deltoid Interossei Grip Wrist Ext. Wrist Flex.  R '5 5 5 5 5 5 5  '$ L '5 5 5 5 5 5 5   '$ Side Iliopsoas Quads Hamstring PF DF EHL  R '5 5 5 5 5 5  '$ L '5 5 5 5 5 5   '$ Reflexes are ***2+ and symmetric at the biceps, triceps, brachioradialis, patella and achilles.   Hoffman's is absent.  Clonus is not present.   Bilateral upper and lower extremity sensation is intact to light touch.    No evidence of dysmetria noted.  Gait is normal.   ***No difficulty with tandem gait.    Medical Decision Making  Imaging: Lumbar MRI scan 07/06/18:  FINDINGS: Segmentation: Transitional lumbosacral anatomy. Lowest well-formed disc will be labeled L5-S1. Same numbering system employed as on previous exam.   Alignment: 4 mm anterolisthesis of L4 on L5, stable. Trace retrolisthesis of L5 on S1. Appearance is stable from previous.   Vertebrae: Vertebral body height maintained without evidence for acute or chronic fracture. Bone marrow signal intensity diffusely decreased on T1 weighted imaging, most commonly related to anemia, smoking, or obesity. Superimposed 1 cm benign hemangioma within the L4 vertebral body. No worrisome osseous lesions. Mild reactive endplate changes noted about the right aspect of the L4-5 interspace.   Conus medullaris and cauda equina: Conus extends to the L1-2 level. Conus medullaris within normal limits. 6 mm nodular density involving the nerve roots of the cauda equina at the level of L2-3 noted (series 5, image 7), slightly more prominent relative to 2015, but similar as compared to previous study from 2019. 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   Paraspinal and other soft tissues: Paraspinous soft tissues demonstrate no acute finding. Multiple scattered T2 hyperintense simple right renal cyst noted. Visualized visceral structures otherwise  unremarkable.   Disc levels:   L1-2: Negative interspace. Mild bilateral facet hypertrophy. No canal or foraminal stenosis. No impingement.   L2-3: Mild disc desiccation with minimal annular disc bulge. Mild-to-moderate facet and ligament flavum hypertrophy. No significant canal or lateral recess stenosis. Foramina remain patent.   L3-4: Chronic intervertebral disc space narrowing with diffuse disc bulge and disc desiccation. Disc bulging centric to the left. Moderate facet and ligament flavum hypertrophy with associated small bilateral joint effusions. Associated small synovial cyst noted at the posterior aspect of the L3-4 facets measuring up to 10 mm on the left and 9 mm on the right (series 8, image 20). Resultant severe canal with bilateral subarticular stenosis. Moderate bilateral L3 foraminal narrowing, left slightly worse than right.   L4-5: 4 mm anterolisthesis. Associated broad posterior disc bulge with disc desiccation and intervertebral disc space narrowing. Superimposed right foraminal/far lateral component with  slight caudad angulation is relatively similar to previous (series 8, image 21). There is a new/more prominent more focal left subarticular/foraminal component with slight caudad angulation (series 8, image 21). Associated annular fissure. This disc component contacts the exiting left L4 nerve root in the left neural foramen (series 5, image 13). Severe left with moderate to severe right lateral recess stenosis, slightly worsened from previous. Residual mild narrowing of the central canal relatively unchanged. Moderate bilateral L4 foraminal stenosis, slightly worsened on the left, similar on the right.   L5-S1: Diffuse disc bulge with disc desiccation. Superimposed central disc protrusion with annular fissure. Previous left laminectomy. Mild to moderate facet hypertrophy. Residual moderate bilateral subarticular stenosis without narrowing of the  central canal. Foramina remain patent.   IMPRESSION: 1. Diffuse disc bulge at L4-5 with new/more prominent left subarticular/foraminal component, with worsened moderate left foraminal and severe left lateral recess stenosis, potentially affecting either the left L4 or descending L5 nerve roots. Additional multifactorial degenerative changes at this level with resultant moderate to severe right lateral recess narrowing and moderate right L4 foraminal stenosis relatively stable. 2. Disc bulging with advanced facet degeneration at L3-4 with resultant severe canal with moderate bilateral L3 foraminal stenosis, similar to previous. 3. Disc bulge with small central disc protrusion at L5-S1 with resultant moderate bilateral subarticular stenosis, also similar to previous. 4. 6 mm nodular density involving the nerve roots of the cauda equina at the level of L2-3, similar to previous, but slightly more prominent as compared to 2015. 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 While this is almost certainly benign, further assessment with postcontrast imaging suggested for complete assessment.     Electronically Signed   By: Jeannine Boga M.D.   On: 07/06/2018 23:44  I have personally reviewed the images and agree with the above interpretation.  Assessment and Plan: Mr. Alspaugh is a pleasant 76 y.o. male with ***  Known slip L4-L5 and retrolisthesis L5-S1 along with severe central stenosis L3-L4 and varying levels of foraminal/lateral recess stenosis L4-S1. Question of nerve sheath tumor at L2-L3 as well.   Most recent lumbar MRI was 07/06/18.   Above treatment options discussed with patient and following plan made:   - Order for physical therapy for *** spine ***. - Continue on current medications including ***. Reviewed proper dosing along with risks and benefits. Take and NSAIDs with food.      I spent a total of *** minutes in face-to-face and  non-face-to-face activities related to this patient's care today.  Thank you for involving me in the care of this patient.   Geronimo Boot PA-C Dept. of Neurosurgery

## 2021-11-10 ENCOUNTER — Telehealth: Payer: Self-pay

## 2021-11-10 ENCOUNTER — Ambulatory Visit: Payer: No Typology Code available for payment source | Admitting: Orthopedic Surgery

## 2021-11-10 NOTE — Telephone Encounter (Signed)
-----   Message from Meade Maw, MD sent at 11/10/2021 10:16 AM EDT ----- Ok. He can call back for an appointment if he'd like.  Thanks for talking to him.  Ardyth Gal ----- Message ----- From: Peggyann Shoals Sent: 11/10/2021  10:09 AM EDT To: Geronimo Boot, PA-C; Meade Maw, MD; #  Patient walked in, but his appt was at [facility]. I told him that we needed to reschedule appt and we could work him in on 11/20/2021. He said that he had been looking for the office since 8:30am. He went to different offices and no one could tell him exactly where we were. I tried explaining to him that that when I call the patient and schedule the appts I always give them the address and directions. He said "Sh## I'm in pain forget about it" and he walked out. He was referred by [facility]

## 2021-11-11 DIAGNOSIS — M5416 Radiculopathy, lumbar region: Secondary | ICD-10-CM | POA: Diagnosis not present

## 2021-11-12 ENCOUNTER — Ambulatory Visit: Payer: No Typology Code available for payment source | Admitting: Urology

## 2021-11-12 DIAGNOSIS — N401 Enlarged prostate with lower urinary tract symptoms: Secondary | ICD-10-CM

## 2021-11-18 NOTE — Progress Notes (Deleted)
11/18/21 8:23 AM   [name] 1945-09-28 [id number]  Referring provider:  Cletis Athens, MD [address] [place],  [place] [address]  Urological history  1. ED -contributing factors of age, BPH, spinal injury, brain injury, DM, HTN, HLD and sleep apnea -SHIM 5 -PDE5i's intolerable    2. BPH with LU TS -I PSS *** -PVR *** -managed with tamsulosin 0.4 mg   3. Prostate cancer screening -PSA (09/2021) 0.3 -maternal uncle with fatal prostate cancer  No chief complaint on file.    HPI: Paul Giovanelli [name] is a 76 y.o.male who presents today for yearly visit.    At his visit on 10/02/2021, he was having nocturia x 2.  He states that he feels that he was better on the tamsulosin 0.4 mg daily.       Score:  1-7 Mild 8-19 Moderate 20-35 Severe   PMH: Past Medical History:  Diagnosis Date   Acute prostatitis    BPH (benign prostatic hyperplasia)    Chronic kidney disease    Dysplasia of prostate    Erectile dysfunction    HTN (hypertension)    Hypogonadism in male    Orchitis and epididymitis    Over weight    Palindromic rheumatism, hand    Prostatitis    Rectum pain    Testicle swelling    Testicle tenderness    Testicular mass     Surgical History: Past Surgical History:  Procedure Laterality Date   BACK SURGERY     head surgery     from fall  drilled hole in brain to relieve pressure    Home Medications:  Allergies as of 11/19/2021   No Known Allergies      Medication List        Accurate as of November 18, 2021  8:23 AM. If you have any questions, ask your nurse or doctor.          ARTHRITIS STRENGTH BC POWDER PO Take by mouth.   HAIR SKIN NAILS PO Take 1 Capful by mouth daily at 2 PM.   Joint Support Caps Take 1 tablet by mouth daily at 2 PM.   PROSTATE SUPPORT PO Take 1 capsule by mouth daily at 2 PM.   Lancets 33G Misc Check blood sugar daily   LAXATIVE PO Take 1 each by mouth daily at 2 PM.    levothyroxine 175 MCG tablet Commonly known as: SYNTHROID Take 1 tablet (175 mcg total) by mouth daily.   tamsulosin 0.4 MG Caps capsule Commonly known as: FLOMAX Take 1 capsule (0.4 mg total) by mouth daily.   True Metrix Level 1 Low Soln 1 Bottle by In Vitro route daily.   True Metrix Meter Devi Check blood sugar daily   True Metrix Pro Blood Glucose test strip Generic drug: glucose blood Use as instructed   valsartan-hydrochlorothiazide 320-25 MG tablet Commonly known as: DIOVAN-HCT Take 1 tablet by mouth daily.        Allergies:  No Known Allergies  Family History: Family History  Problem Relation Age of Onset   Cancer Maternal Aunt    Prostate cancer Neg Hx    Kidney disease Neg Hx    Kidney cancer Neg Hx    Bladder Cancer Neg Hx     Social History:  reports that he has quit smoking. He has never used smokeless tobacco. He reports that he does not drink alcohol and does not use drugs.   Physical Exam: There were no vitals  taken for this visit.  Constitutional:  Well nourished. Alert and oriented, No acute distress. HEENT: [place] AT, moist mucus membranes.  Trachea midline Cardiovascular: No clubbing, cyanosis, or edema. Respiratory: Normal respiratory effort, no increased work of breathing. GU: No CVA tenderness.  No bladder fullness or masses.  Patient with circumcised/uncircumcised phallus. ***Foreskin easily retracted***  Urethral meatus is patent.  No penile discharge. No penile lesions or rashes. Scrotum without lesions, cysts, rashes and/or edema.  Testicles are located scrotally bilaterally. No masses are appreciated in the testicles. Left and right epididymis are normal. Rectal: Patient with  normal sphincter tone. Anus and perineum without scarring or rashes. No rectal masses are appreciated. Prostate is approximately *** grams, *** nodules are appreciated. Seminal vesicles are normal. Neurologic: Grossly intact, no focal deficits, moving all 4  extremities. Psychiatric: Normal mood and affect.   Laboratory Data: N/A    Pertinent Imaging: N/A  Assessment & Plan:    1. BPH with LUTS -PSA stable -most bothersome symptoms are nocturia -continue conservative management, avoiding bladder irritants and timed voiding's -restart tamsulosin 0.4 mg daily    No follow-ups on file.  Rayanna Matusik, [place] [address], [place],  [address] [phone]

## 2021-11-19 ENCOUNTER — Ambulatory Visit: Payer: No Typology Code available for payment source | Admitting: Urology

## 2021-11-19 ENCOUNTER — Encounter: Payer: Self-pay | Admitting: Urology

## 2021-11-19 DIAGNOSIS — N401 Enlarged prostate with lower urinary tract symptoms: Secondary | ICD-10-CM

## 2021-11-25 ENCOUNTER — Other Ambulatory Visit: Payer: Self-pay | Admitting: Internal Medicine

## 2021-11-30 NOTE — Progress Notes (Deleted)
11/30/21 4:41 PM   Paul Ramus [name] 1945/10/09 [id number]  Referring provider:  Cletis Athens, MD [address] [place],  [place] [address]  Urological history  1. ED -contributing factors of age, BPH, spinal injury, brain injury, DM, HTN, HLD and sleep apnea -SHIM 5 -PDE5i's intolerable    2. BPH with LU TS -I PSS *** -PVR *** -managed with tamsulosin 0.4 mg   3. Prostate cancer screening -PSA (09/2021) 0.3 -maternal uncle with fatal prostate cancer  No chief complaint on file.    HPI: Paul Nordin [name] is a 76 y.o.male who presents today for yearly visit.    At his visit on 10/02/2021, he was having nocturia x 2.  He states that he feels that he was better on the tamsulosin 0.4 mg daily.       Score:  1-7 Mild 8-19 Moderate 20-35 Severe   PMH: Past Medical History:  Diagnosis Date   Acute prostatitis    BPH (benign prostatic hyperplasia)    Chronic kidney disease    Dysplasia of prostate    Erectile dysfunction    HTN (hypertension)    Hypogonadism in male    Orchitis and epididymitis    Over weight    Palindromic rheumatism, hand    Prostatitis    Rectum pain    Testicle swelling    Testicle tenderness    Testicular mass     Surgical History: Past Surgical History:  Procedure Laterality Date   BACK SURGERY     head surgery     from fall  drilled hole in brain to relieve pressure    Home Medications:  Allergies as of 12/01/2021   No Known Allergies      Medication List        Accurate as of November 30, 2021  4:41 PM. If you have any questions, ask your nurse or doctor.          ARTHRITIS STRENGTH BC POWDER PO Take by mouth.   HAIR SKIN NAILS PO Take 1 Capful by mouth daily at 2 PM.   Joint Support Caps Take 1 tablet by mouth daily at 2 PM.   PROSTATE SUPPORT PO Take 1 capsule by mouth daily at 2 PM.   Lancets 33G Misc Check blood sugar daily   LAXATIVE PO Take 1 each by mouth daily at 2 PM.    levothyroxine 175 MCG tablet Commonly known as: SYNTHROID Take 1 tablet (175 mcg total) by mouth daily.   rosuvastatin 10 MG tablet Commonly known as: CRESTOR Take 1 tablet by mouth once daily   tamsulosin 0.4 MG Caps capsule Commonly known as: FLOMAX Take 1 capsule (0.4 mg total) by mouth daily.   True Metrix Level 1 Low Soln 1 Bottle by In Vitro route daily.   True Metrix Meter Devi Check blood sugar daily   True Metrix Pro Blood Glucose test strip Generic drug: glucose blood Use as instructed   valsartan-hydrochlorothiazide 320-25 MG tablet Commonly known as: DIOVAN-HCT Take 1 tablet by mouth daily.        Allergies:  No Known Allergies  Family History: Family History  Problem Relation Age of Onset   Cancer Maternal Aunt    Prostate cancer Neg Hx    Kidney disease Neg Hx    Kidney cancer Neg Hx    Bladder Cancer Neg Hx     Social History:  reports that he has quit smoking. He has never used smokeless tobacco. He reports that he  does not drink alcohol and does not use drugs.   Physical Exam: There were no vitals taken for this visit.  Constitutional:  Well nourished. Alert and oriented, No acute distress. HEENT: [place] AT, moist mucus membranes.  Trachea midline Cardiovascular: No clubbing, cyanosis, or edema. Respiratory: Normal respiratory effort, no increased work of breathing. GU: No CVA tenderness.  No bladder fullness or masses.  Patient with circumcised/uncircumcised phallus. ***Foreskin easily retracted***  Urethral meatus is patent.  No penile discharge. No penile lesions or rashes. Scrotum without lesions, cysts, rashes and/or edema.  Testicles are located scrotally bilaterally. No masses are appreciated in the testicles. Left and right epididymis are normal. Rectal: Patient with  normal sphincter tone. Anus and perineum without scarring or rashes. No rectal masses are appreciated. Prostate is approximately *** grams, *** nodules are appreciated. Seminal  vesicles are normal. Neurologic: Grossly intact, no focal deficits, moving all 4 extremities. Psychiatric: Normal mood and affect.   Laboratory Data: N/A    Pertinent Imaging: N/A  Assessment & Plan:    1. BPH with LUTS -PSA stable -most bothersome symptoms are nocturia -continue conservative management, avoiding bladder irritants and timed voiding's -restart tamsulosin 0.4 mg daily    No follow-ups on file.  Travor Royce, [place] [address], [place], [place] [address] [phone]

## 2021-12-01 ENCOUNTER — Encounter: Payer: Self-pay | Admitting: Student in an Organized Health Care Education/Training Program

## 2021-12-01 ENCOUNTER — Ambulatory Visit: Payer: No Typology Code available for payment source | Admitting: Urology

## 2021-12-01 ENCOUNTER — Ambulatory Visit
Payer: No Typology Code available for payment source | Attending: Student in an Organized Health Care Education/Training Program | Admitting: Student in an Organized Health Care Education/Training Program

## 2021-12-01 VITALS — BP 163/103 | HR 63 | Temp 97.3°F | Resp 16 | Ht 73.0 in | Wt 231.0 lb

## 2021-12-01 DIAGNOSIS — G894 Chronic pain syndrome: Secondary | ICD-10-CM | POA: Insufficient documentation

## 2021-12-01 DIAGNOSIS — M48062 Spinal stenosis, lumbar region with neurogenic claudication: Secondary | ICD-10-CM

## 2021-12-01 DIAGNOSIS — G8929 Other chronic pain: Secondary | ICD-10-CM | POA: Insufficient documentation

## 2021-12-01 DIAGNOSIS — M5416 Radiculopathy, lumbar region: Secondary | ICD-10-CM | POA: Diagnosis not present

## 2021-12-01 DIAGNOSIS — Z5181 Encounter for therapeutic drug level monitoring: Secondary | ICD-10-CM | POA: Diagnosis not present

## 2021-12-01 DIAGNOSIS — Z79891 Long term (current) use of opiate analgesic: Secondary | ICD-10-CM | POA: Diagnosis not present

## 2021-12-01 DIAGNOSIS — N401 Enlarged prostate with lower urinary tract symptoms: Secondary | ICD-10-CM

## 2021-12-01 MED ORDER — OXYCODONE-ACETAMINOPHEN 10-325 MG PO TABS: 1.0000 | ORAL_TABLET | Freq: Three times a day (TID) | ORAL | 0 refills | Status: DC | PRN

## 2021-12-01 NOTE — Progress Notes (Signed)
PROVIDER NOTE: Information contained herein reflects review and annotations entered in association with encounter. Interpretation of such information and data should be left to medically-trained personnel. Information provided to patient can be located elsewhere in the medical record under "Patient Instructions". Document created using STT-dictation technology, any transcriptional errors that may result from process are unintentional.    Patient: Paul Briggs [name]  Service Category: E/M  Provider: Gillis Santa, MD  DOB: 1945-08-30  DOS: 12/01/2021  Specialty: Interventional Pain Management  MRN: [id number]  Setting: Ambulatory outpatient  PCP: Cletis Athens, MD  Type: Established Patient    Referring Provider: Cletis Athens, MD  Location: Office  Delivery: Face-to-face     HPI  [name] [name], a 76 y.o. year old male, is here today because of his Lumbar radicular pain [M54.16]. Paul Briggs primary complain today is Back Pain (lower) and Hip Pain (both) Last encounter: My last encounter with him was on 09/03/21 Pertinent problems: Paul Briggs has Chronic back pain; Disorder of rotator cuff, left; Primary osteoarthritis of left shoulder; Left rotator cuff tear arthropathy; Spinal stenosis, lumbar region, with neurogenic claudication; Chronic left shoulder pain; Chronic pain syndrome; and Pain management contract signed on their pertinent problem list. Pain Assessment: Severity of Chronic pain is reported as a 8 /10. Location: Back Lower/both legs to the feet. Onset: More than a month ago. Quality: Dull. Timing: Constant. Modifying factor(s): Oxycodone, back brace. Vitals:  height is _0  (1.854 m) and weight is 231 lb (104.8 kg). His temporal temperature is 97.3 F (36.3 C) (abnormal). His blood pressure is 163/103 (abnormal) and his pulse is 63. His respiration is 16 and oxygen saturation is 99%.   Reason for encounter: medication management.  Chronic pain related to lumbar  spinal stenosis and neurogenic claudication.  Continues oxycodone 10 mg 3 times a day as needed.  He continues to work in [employer]. He is seeing Dr. Alvin Critchley at [facility], neurosurgery.  Dr. Kayleen Memos has recommended physical therapy for 6 weeks.  If he is not improved after that, patient states that plan is for a lumbar MRI and discussion of lumbar spine surgery. Of note, Paul Briggs has tried Lyrica in the past but states that it results in confusion and mild sedation at times.  Pharmacotherapy Assessment  Analgesic: Percocet 10 mg 3 times daily as needed, quantity 90/month   Monitoring: [place] PMP: PDMP reviewed during this encounter.       Pharmacotherapy: No side-effects or adverse reactions reported. Compliance: No problems identified. Effectiveness: Clinically acceptable.  [name], RN  12/01/2021 11:25 AM  Sign when Signing Visit Nursing Pain Medication Assessment:  Safety precautions to be maintained throughout the outpatient stay will include: orient to surroundings, keep bed in low position, maintain call bell within reach at all times, provide assistance with transfer out of bed and ambulation.  Medication Inspection Compliance: Paul Briggs to bring his pills to be counted. He was reminded that bringing the medication bottles, even when empty, is a requirement.  Medication: None brought in. Pill/Patch Count: None available to be counted. Bottle Appearance: No container available. Did not bring bottle(s) to appointment. Filled Date: N/A Last Medication intake:  Today afety precautions to be maintained throughout the outpatient stay will include: orient to surroundings, keep bed in low position, maintain call bell within reach at all times, provide assistance with transfer out of bed and ambulation.     UDS:  Summary  Date Value  Ref Range Status  10/28/2020 Note  Final    Comment:     ==================================================================== Compliance Drug Analysis, Ur ==================================================================== Test                             Result       Flag       Units  Drug Present not Declared for Prescription Verification   Oxycodone                      85           UNEXPECTED ng/mg creat   Oxymorphone                    256          UNEXPECTED ng/mg creat   Noroxycodone                   318          UNEXPECTED ng/mg creat   Noroxymorphone                 151          UNEXPECTED ng/mg creat    Sources of oxycodone are scheduled prescription medications.    Oxymorphone, noroxycodone, and noroxymorphone are expected    metabolites of oxycodone. Oxymorphone is also available as a    scheduled prescription medication.    Acetaminophen                  PRESENT      UNEXPECTED   Ibuprofen                      PRESENT      UNEXPECTED  Drug Absent but Declared for Prescription Verification   Tramadol                       Not Detected UNEXPECTED ng/mg creat   Pregabalin                     Not Detected UNEXPECTED   Cyclobenzaprine                Not Detected UNEXPECTED   Salicylate                     Not Detected UNEXPECTED    Salicylate, as indicated in the declared medication list, is not    always detected even when used as directed.     Aspirin, as indicated in the declared medication list, is not always    detected even when used as directed.  ==================================================================== Test                      Result    Flag   Units      Ref Range   Creatinine              72               mg/dL      >=20 ==================================================================== Declared Medications:  The flagging and interpretation on this report are based on the  following declared medications.  Unexpected results may arise from  inaccuracies in the declared medications.   **Note: The testing  scope of this panel includes these medications:   Cyclobenzaprine (Flexeril)  Pregabalin (Lyrica)  Tramadol (Ultram)   **  Note: The testing scope of this panel does not include small to  moderate amounts of these reported medications:   Aspirin  Salicylate (Salicylamide)   **Note: The testing scope of this panel does not include the  following reported medications:   Amlodipine (Norvasc)  Caffeine  Doxycycline  Hydrochlorothiazide (Zestoretic)  Levothyroxine (Synthroid)  Lisinopril (Zestoretic)  Metformin (Glucophage)  Omeprazole (Prilosec)  Rosuvastatin (Crestor)  Tadalafil (Cialis)  Tamsulosin (Flomax)  Topical ==================================================================== For clinical consultation, please call [phone]. ====================================================================      ROS  Constitutional: Denies any fever or chills Gastrointestinal: No reported hemesis, hematochezia, vomiting, or acute GI distress Musculoskeletal:  low back and bilateral leg pain Neurological: No reported episodes of acute onset apraxia, aphasia, dysarthria, agnosia, amnesia, paralysis, loss of coordination, or loss of consciousness  Medication Review  Aspirin-Salicylamide-Caffeine, Bisacodyl, Black Currant Seed Oil, Lancets 33G, True Metrix Level 1, True Metrix Meter, glucose blood, levothyroxine, oxyCODONE-acetaminophen, rosuvastatin, tamsulosin, and valsartan-hydrochlorothiazide  History Review  Allergy: Mr. Forst has No Known Allergies. Drug: Mr. Hockey  reports no history of drug use. Alcohol:  reports no history of alcohol use. Tobacco:  reports that he has quit smoking. He has never used smokeless tobacco. Social: Mr. Lampkins  reports that he has quit smoking. He has never used smokeless tobacco. He reports that he does not drink alcohol and does not use drugs. Medical:  has a past medical history of Acute prostatitis, BPH (benign prostatic  hyperplasia), Chronic kidney disease, Dysplasia of prostate, Erectile dysfunction, HTN (hypertension), Hypogonadism in male, Orchitis and epididymitis, Over weight, Palindromic rheumatism, hand, Prostatitis, Rectum pain, Testicle swelling, Testicle tenderness, and Testicular mass. Surgical: Mr. Xiong  has a past surgical history that includes Back surgery and head surgery. Family: family history includes Cancer in his maternal aunt.  Laboratory Chemistry Profile   Renal Lab Results  Component Value Date   BUN 19 07/06/2021   CREATININE 1.08 95/18/8416   BCR NOT APPLICABLE 60/63/0160   GFRAA >60 10/22/2015   GFRNONAA 57 (L) 10/22/2015    Hepatic Lab Results  Component Value Date   AST 37 (H) 07/06/2021   ALT 26 07/06/2021   ALBUMIN 3.8 10/22/2015   ALKPHOS 73 10/22/2015   LIPASE 357 02/25/2012    Electrolytes Lab Results  Component Value Date   NA 141 07/06/2021   K 4.1 07/06/2021   CL 107 07/06/2021   CALCIUM 9.1 07/06/2021    Bone Lab Results  Component Value Date   TESTOSTERONE 429 04/19/2017    Inflammation (CRP: Acute Phase) (ESR: Chronic Phase) Lab Results  Component Value Date   ESRSEDRATE 36 (H) 07/20/2015   LATICACIDVEN 1.6 07/20/2015         Note: Above Lab results reviewed.   Physical Exam  General appearance: Well nourished, well developed, and well hydrated. In no apparent acute distress Mental status: Alert, oriented x 3 (person, place, & time)       Respiratory: No evidence of acute respiratory distress Eyes: PERLA Vitals: BP (!) 163/103   Pulse 63   Temp (!) 97.3 F (36.3 C) (Temporal)   Resp 16   Ht _0  (1.854 m)   Wt 231 lb (104.8 kg)   SpO2 99%   BMI 30.48 kg/m  BMI: Estimated body mass index is 30.48 kg/m as calculated from the following:   Height as of this encounter: _1  (1.854 m).   Weight as of this encounter: 231 lb (104.8 kg). Ideal: Ideal body weight: 79.9 kg (176 lb 2.4  oz) Adjusted ideal body weight: 89.9 kg (198 lb  1.4 oz)    Lumbar Spine Area Exam  Skin & Axial Inspection: No masses, redness, or swelling Alignment: Symmetrical Functional ROM: Pain restricted ROM       Stability: No instability detected Muscle Tone/Strength: Functionally intact. No obvious neuro-muscular anomalies detected. Sensory (Neurological): Neurogenic pain pattern   Gait & Posture Assessment  Ambulation: Unassisted Gait: Relatively normal for age and body habitus Posture: WNL   Lower Extremity Exam      Side: Right lower extremity   Side: Left lower extremity  Stability: No instability observed           Stability: No instability observed          Skin & Extremity Inspection: Skin color, temperature, and hair growth are WNL. No peripheral edema or cyanosis. No masses, redness, swelling, asymmetry, or associated skin lesions. No contractures.   Skin & Extremity Inspection: Skin color, temperature, and hair growth are WNL. No peripheral edema or cyanosis. No masses, redness, swelling, asymmetry, or associated skin lesions. No contractures.  Functional ROM: Pain restricted ROM for hip and knee joints           Functional ROM: Pain restricted ROM for hip and knee joints          Muscle Tone/Strength: Functionally intact. No obvious neuro-muscular anomalies detected.   Muscle Tone/Strength: Functionally intact. No obvious neuro-muscular anomalies detected.  Sensory (Neurological): Neurogenic pain pattern         Sensory (Neurological): Neurogenic pain pattern        DTR: Patellar: deferred today Achilles: deferred today Plantar: deferred today   DTR: Patellar: deferred today Achilles: deferred today Plantar: deferred today  Palpation: No palpable anomalies   Palpation: No palpable anomalies     Assessment   Diagnosis Status  1. Lumbar radicular pain   2. Spinal stenosis, lumbar region, with neurogenic claudication   3. Chronic radicular lumbar pain   4. Chronic pain syndrome     Persistent Persistent Persistent      Plan of Care    [name] [name] has a current medication list which includes the following long-term medication(s): levothyroxine, rosuvastatin, and valsartan-hydrochlorothiazide.  Pharmacotherapy (Medications Ordered): Meds ordered this encounter  Medications   oxyCODONE-acetaminophen (PERCOCET) 10-325 MG tablet    Sig: Take 1 tablet by mouth every 8 (eight) hours as needed.    Dispense:  90 tablet    Refill:  0   oxyCODONE-acetaminophen (PERCOCET) 10-325 MG tablet    Sig: Take 1 tablet by mouth every 8 (eight) hours as needed.    Dispense:  90 tablet    Refill:  0   oxyCODONE-acetaminophen (PERCOCET) 10-325 MG tablet    Sig: Take 1 tablet by mouth every 8 (eight) hours as needed.    Dispense:  90 tablet    Refill:  0    Orders:  Orders Placed This Encounter  Procedures   ToxASSURE Select 13 (MW), Urine    Volume: 30 ml(s). Minimum 3 ml of urine is needed. Document temperature of fresh sample. Indications: Long term (current) use of opiate analgesic [phone])    Order Specific Question:   Release to patient    Answer:   Immediate   Follow-up plan:   Return in about 3 months (around 03/03/2022) for Medication Management, in person.     Left shoulder injection 11/10/20, 12/24/20. L5/S1 ESI 04/15/21      Recent Visits Date Type Provider Dept  09/03/21  Office Visit Gillis Santa, MD [facility]  Showing recent visits within past 90 days and meeting all other requirements Today's Visits Date Type Provider Dept  12/01/21 Office Visit Gillis Santa, MD [facility]  Showing today's visits and meeting all other requirements Future Appointments Date Type Provider Dept  02/25/22 Appointment Gillis Santa, MD [facility]  Showing future appointments within next 90 days and meeting all other requirements  I discussed the assessment and treatment plan with the patient. The patient was provided an opportunity to ask questions and  all were answered. The patient agreed with the plan and demonstrated an understanding of the instructions.  Patient advised to call back or seek an in-person evaluation if the symptoms or condition worsens.  Duration of encounter: 30 minutes.  Note by: Gillis Santa, MD Date: 12/01/2021; Time: 11:53 AM

## 2021-12-01 NOTE — Progress Notes (Signed)
Nursing Pain Medication Assessment:  Safety precautions to be maintained throughout the outpatient stay will include: orient to surroundings, keep bed in low position, maintain call bell within reach at all times, provide assistance with transfer out of bed and ambulation.  Medication Inspection Compliance: Paul Briggs did not comply with our request to bring his pills to be counted. He was reminded that bringing the medication bottles, even when empty, is a requirement.  Medication: None brought in. Pill/Patch Count: None available to be counted. Bottle Appearance: No container available. Did not bring bottle(s) to appointment. Filled Date: N/A Last Medication intake:  Today afety precautions to be maintained throughout the outpatient stay will include: orient to surroundings, keep bed in low position, maintain call bell within reach at all times, provide assistance with transfer out of bed and ambulation.

## 2021-12-02 NOTE — Progress Notes (Deleted)
12/02/21 10:17 PM   Paul Ramus [name] 1945/12/14 [id number]  Referring provider:  Cletis Athens, MD [address] [place],  [place] [address]  Urological history  1. ED -contributing factors of age, BPH, spinal injury, brain injury, DM, HTN, HLD and sleep apnea -SHIM 5 -PDE5i's intolerable    2. BPH with LU TS -I PSS *** -PVR *** -managed with tamsulosin 0.4 mg   3. Prostate cancer screening -PSA (09/2021) 0.3 -maternal uncle with fatal prostate cancer  No chief complaint on file.    HPI: Paul Yandell [name] is a 76 y.o.male who presents today for yearly visit.    At his visit on 10/02/2021, he was having nocturia x 2.  He states that he feels that he was better on the tamsulosin 0.4 mg daily.       Score:  1-7 Mild 8-19 Moderate 20-35 Severe   PMH: Past Medical History:  Diagnosis Date   Acute prostatitis    BPH (benign prostatic hyperplasia)    Chronic kidney disease    Dysplasia of prostate    Erectile dysfunction    HTN (hypertension)    Hypogonadism in male    Orchitis and epididymitis    Over weight    Palindromic rheumatism, hand    Prostatitis    Rectum pain    Testicle swelling    Testicle tenderness    Testicular mass     Surgical History: Past Surgical History:  Procedure Laterality Date   BACK SURGERY     head surgery     from fall  drilled hole in brain to relieve pressure    Home Medications:  Allergies as of 12/03/2021   No Known Allergies      Medication List        Accurate as of December 02, 2021 10:17 PM. If you have any questions, ask your nurse or doctor.          ARTHRITIS STRENGTH BC POWDER PO Take by mouth.   BLACK CURRANT SEED OIL PO Take by mouth daily.   Lancets 33G Misc Check blood sugar daily   LAXATIVE PO Take 1 each by mouth daily at 2 PM.   levothyroxine 175 MCG tablet Commonly known as: SYNTHROID Take 1 tablet (175 mcg total) by mouth daily.   oxyCODONE-acetaminophen 10-325 MG  tablet Commonly known as: PERCOCET Take 1 tablet by mouth every 8 (eight) hours as needed.   oxyCODONE-acetaminophen 10-325 MG tablet Commonly known as: PERCOCET Take 1 tablet by mouth every 8 (eight) hours as needed. Start taking on: December 31, 2021   oxyCODONE-acetaminophen 10-325 MG tablet Commonly known as: PERCOCET Take 1 tablet by mouth every 8 (eight) hours as needed. Start taking on: January 30, 2022   rosuvastatin 10 MG tablet Commonly known as: CRESTOR Take 1 tablet by mouth once daily   tamsulosin 0.4 MG Caps capsule Commonly known as: FLOMAX Take 1 capsule (0.4 mg total) by mouth daily.   True Metrix Level 1 Low Soln 1 Bottle by In Vitro route daily.   True Metrix Meter Devi Check blood sugar daily   True Metrix Pro Blood Glucose test strip Generic drug: glucose blood Use as instructed   valsartan-hydrochlorothiazide 320-25 MG tablet Commonly known as: DIOVAN-HCT Take 1 tablet by mouth daily.        Allergies:  No Known Allergies  Family History: Family History  Problem Relation Age of Onset   Cancer Maternal Aunt    Prostate cancer Neg Hx  Kidney disease Neg Hx    Kidney cancer Neg Hx    Bladder Cancer Neg Hx     Social History:  reports that he has quit smoking. He has never used smokeless tobacco. He reports that he does not drink alcohol and does not use drugs.   Physical Exam: There were no vitals taken for this visit.  Constitutional:  Well nourished. Alert and oriented, No acute distress. HEENT: [place] AT, moist mucus membranes.  Trachea midline Cardiovascular: No clubbing, cyanosis, or edema. Respiratory: Normal respiratory effort, no increased work of breathing. GU: No CVA tenderness.  No bladder fullness or masses.  Patient with circumcised/uncircumcised phallus. ***Foreskin easily retracted***  Urethral meatus is patent.  No penile discharge. No penile lesions or rashes. Scrotum without lesions, cysts, rashes and/or edema.  Testicles  are located scrotally bilaterally. No masses are appreciated in the testicles. Left and right epididymis are normal. Rectal: Patient with  normal sphincter tone. Anus and perineum without scarring or rashes. No rectal masses are appreciated. Prostate is approximately *** grams, *** nodules are appreciated. Seminal vesicles are normal. Neurologic: Grossly intact, no focal deficits, moving all 4 extremities. Psychiatric: Normal mood and affect.   Laboratory Data: N/A    Pertinent Imaging: N/A  Assessment & Plan:    1. BPH with LUTS -PSA stable -most bothersome symptoms are nocturia -continue conservative management, avoiding bladder irritants and timed voiding's -restart tamsulosin 0.4 mg daily    No follow-ups on file.  Arnika Larzelere, [place] [address], [place], [place] [address] [phone]

## 2021-12-03 ENCOUNTER — Ambulatory Visit: Payer: No Typology Code available for payment source | Admitting: Urology

## 2021-12-03 LAB — TOXASSURE SELECT 13 (MW), URINE

## 2021-12-08 DIAGNOSIS — M5459 Other low back pain: Secondary | ICD-10-CM | POA: Diagnosis not present

## 2021-12-08 DIAGNOSIS — M4316 Spondylolisthesis, lumbar region: Secondary | ICD-10-CM | POA: Diagnosis not present

## 2021-12-08 DIAGNOSIS — M25552 Pain in left hip: Secondary | ICD-10-CM | POA: Diagnosis not present

## 2021-12-16 DIAGNOSIS — D649 Anemia, unspecified: Secondary | ICD-10-CM | POA: Diagnosis not present

## 2021-12-16 DIAGNOSIS — I10 Essential (primary) hypertension: Secondary | ICD-10-CM | POA: Diagnosis not present

## 2021-12-16 DIAGNOSIS — E782 Mixed hyperlipidemia: Secondary | ICD-10-CM | POA: Diagnosis not present

## 2021-12-16 DIAGNOSIS — E039 Hypothyroidism, unspecified: Secondary | ICD-10-CM | POA: Diagnosis not present

## 2021-12-16 DIAGNOSIS — E1149 Type 2 diabetes mellitus with other diabetic neurological complication: Secondary | ICD-10-CM | POA: Diagnosis not present

## 2021-12-23 DIAGNOSIS — E039 Hypothyroidism, unspecified: Secondary | ICD-10-CM | POA: Diagnosis not present

## 2021-12-23 DIAGNOSIS — D649 Anemia, unspecified: Secondary | ICD-10-CM | POA: Diagnosis not present

## 2021-12-23 DIAGNOSIS — M545 Low back pain, unspecified: Secondary | ICD-10-CM | POA: Diagnosis not present

## 2021-12-23 DIAGNOSIS — E1122 Type 2 diabetes mellitus with diabetic chronic kidney disease: Secondary | ICD-10-CM | POA: Diagnosis not present

## 2021-12-23 DIAGNOSIS — N1831 Chronic kidney disease, stage 3a: Secondary | ICD-10-CM | POA: Diagnosis not present

## 2021-12-23 DIAGNOSIS — Z6831 Body mass index (BMI) 31.0-31.9, adult: Secondary | ICD-10-CM | POA: Diagnosis not present

## 2021-12-23 DIAGNOSIS — Z008 Encounter for other general examination: Secondary | ICD-10-CM | POA: Diagnosis not present

## 2021-12-23 DIAGNOSIS — I129 Hypertensive chronic kidney disease with stage 1 through stage 4 chronic kidney disease, or unspecified chronic kidney disease: Secondary | ICD-10-CM | POA: Diagnosis not present

## 2021-12-23 DIAGNOSIS — F17211 Nicotine dependence, cigarettes, in remission: Secondary | ICD-10-CM | POA: Diagnosis not present

## 2021-12-23 DIAGNOSIS — E669 Obesity, unspecified: Secondary | ICD-10-CM | POA: Diagnosis not present

## 2021-12-23 DIAGNOSIS — E785 Hyperlipidemia, unspecified: Secondary | ICD-10-CM | POA: Diagnosis not present

## 2021-12-23 DIAGNOSIS — E1169 Type 2 diabetes mellitus with other specified complication: Secondary | ICD-10-CM | POA: Diagnosis not present

## 2021-12-23 DIAGNOSIS — I700 Atherosclerosis of aorta: Secondary | ICD-10-CM | POA: Diagnosis not present

## 2021-12-23 DIAGNOSIS — R2681 Unsteadiness on feet: Secondary | ICD-10-CM | POA: Diagnosis not present

## 2021-12-25 ENCOUNTER — Encounter: Payer: Self-pay | Admitting: Emergency Medicine

## 2021-12-25 ENCOUNTER — Other Ambulatory Visit: Payer: Self-pay

## 2021-12-25 ENCOUNTER — Observation Stay: Payer: No Typology Code available for payment source

## 2021-12-25 ENCOUNTER — Inpatient Hospital Stay
Admission: EM | Admit: 2021-12-25 | Discharge: 2021-12-27 | DRG: 558 | Disposition: A | Discharge status: Home Health | Payer: No Typology Code available for payment source | Attending: Internal Medicine | Admitting: Internal Medicine

## 2021-12-25 ENCOUNTER — Emergency Department: Payer: No Typology Code available for payment source

## 2021-12-25 DIAGNOSIS — M79602 Pain in left arm: Secondary | ICD-10-CM | POA: Diagnosis not present

## 2021-12-25 DIAGNOSIS — G4733 Obstructive sleep apnea (adult) (pediatric): Secondary | ICD-10-CM | POA: Diagnosis present

## 2021-12-25 DIAGNOSIS — I10 Essential (primary) hypertension: Secondary | ICD-10-CM | POA: Insufficient documentation

## 2021-12-25 DIAGNOSIS — N1831 Chronic kidney disease, stage 3a: Secondary | ICD-10-CM | POA: Diagnosis present

## 2021-12-25 DIAGNOSIS — Z6830 Body mass index (BMI) 30.0-30.9, adult: Secondary | ICD-10-CM | POA: Diagnosis not present

## 2021-12-25 DIAGNOSIS — Z8616 Personal history of COVID-19: Secondary | ICD-10-CM | POA: Diagnosis not present

## 2021-12-25 DIAGNOSIS — Z87430 Personal history of prostatic dysplasia: Secondary | ICD-10-CM

## 2021-12-25 DIAGNOSIS — M25532 Pain in left wrist: Secondary | ICD-10-CM | POA: Diagnosis present

## 2021-12-25 DIAGNOSIS — M7041 Prepatellar bursitis, right knee: Secondary | ICD-10-CM | POA: Diagnosis not present

## 2021-12-25 DIAGNOSIS — D72829 Elevated white blood cell count, unspecified: Secondary | ICD-10-CM | POA: Diagnosis not present

## 2021-12-25 DIAGNOSIS — E785 Hyperlipidemia, unspecified: Secondary | ICD-10-CM | POA: Diagnosis not present

## 2021-12-25 DIAGNOSIS — E1129 Type 2 diabetes mellitus with other diabetic kidney complication: Secondary | ICD-10-CM | POA: Diagnosis present

## 2021-12-25 DIAGNOSIS — N401 Enlarged prostate with lower urinary tract symptoms: Secondary | ICD-10-CM | POA: Diagnosis present

## 2021-12-25 DIAGNOSIS — E039 Hypothyroidism, unspecified: Secondary | ICD-10-CM | POA: Diagnosis not present

## 2021-12-25 DIAGNOSIS — E669 Obesity, unspecified: Secondary | ICD-10-CM | POA: Diagnosis present

## 2021-12-25 DIAGNOSIS — M25471 Effusion, right ankle: Secondary | ICD-10-CM | POA: Diagnosis not present

## 2021-12-25 DIAGNOSIS — Z87891 Personal history of nicotine dependence: Secondary | ICD-10-CM | POA: Diagnosis not present

## 2021-12-25 DIAGNOSIS — N529 Male erectile dysfunction, unspecified: Secondary | ICD-10-CM | POA: Diagnosis not present

## 2021-12-25 DIAGNOSIS — E1122 Type 2 diabetes mellitus with diabetic chronic kidney disease: Secondary | ICD-10-CM | POA: Diagnosis not present

## 2021-12-25 DIAGNOSIS — M12349 Palindromic rheumatism, unspecified hand: Secondary | ICD-10-CM | POA: Diagnosis not present

## 2021-12-25 DIAGNOSIS — M71161 Other infective bursitis, right knee: Principal | ICD-10-CM | POA: Diagnosis present

## 2021-12-25 DIAGNOSIS — B9689 Other specified bacterial agents as the cause of diseases classified elsewhere: Secondary | ICD-10-CM | POA: Diagnosis not present

## 2021-12-25 DIAGNOSIS — N138 Other obstructive and reflux uropathy: Secondary | ICD-10-CM | POA: Diagnosis not present

## 2021-12-25 DIAGNOSIS — Z79899 Other long term (current) drug therapy: Secondary | ICD-10-CM

## 2021-12-25 DIAGNOSIS — M25561 Pain in right knee: Secondary | ICD-10-CM | POA: Diagnosis present

## 2021-12-25 DIAGNOSIS — Z7984 Long term (current) use of oral hypoglycemic drugs: Secondary | ICD-10-CM

## 2021-12-25 DIAGNOSIS — N179 Acute kidney failure, unspecified: Secondary | ICD-10-CM | POA: Diagnosis present

## 2021-12-25 DIAGNOSIS — I129 Hypertensive chronic kidney disease with stage 1 through stage 4 chronic kidney disease, or unspecified chronic kidney disease: Secondary | ICD-10-CM | POA: Diagnosis present

## 2021-12-25 DIAGNOSIS — M25571 Pain in right ankle and joints of right foot: Secondary | ICD-10-CM | POA: Diagnosis not present

## 2021-12-25 DIAGNOSIS — E119 Type 2 diabetes mellitus without complications: Secondary | ICD-10-CM | POA: Diagnosis present

## 2021-12-25 DIAGNOSIS — M7989 Other specified soft tissue disorders: Secondary | ICD-10-CM | POA: Diagnosis not present

## 2021-12-25 DIAGNOSIS — L03115 Cellulitis of right lower limb: Secondary | ICD-10-CM | POA: Diagnosis not present

## 2021-12-25 DIAGNOSIS — D72820 Lymphocytosis (symptomatic): Secondary | ICD-10-CM | POA: Diagnosis not present

## 2021-12-25 DIAGNOSIS — J189 Pneumonia, unspecified organism: Secondary | ICD-10-CM | POA: Diagnosis present

## 2021-12-25 LAB — SYNOVIAL CELL COUNT + DIFF, W/ CRYSTALS
Crystals, Fluid: NONE SEEN
Eosinophils-Synovial: 0 %
Lymphocytes-Synovial Fld: 15 %
Monocyte-Macrophage-Synovial Fluid: 13 %
Neutrophil, Synovial: 72 %
WBC, Synovial: 296 /mm3 — ABNORMAL HIGH (ref 0–200)

## 2021-12-25 LAB — CBC WITH DIFFERENTIAL/PLATELET
Abs Immature Granulocytes: 0.1 10*3/uL — ABNORMAL HIGH (ref 0.00–0.07)
Basophils Absolute: 0.1 10*3/uL (ref 0.0–0.1)
Basophils Relative: 0 %
Eosinophils Absolute: 0.1 10*3/uL (ref 0.0–0.5)
Eosinophils Relative: 0 %
HCT: 30.5 % — ABNORMAL LOW (ref 39.0–52.0)
Hemoglobin: 9.6 g/dL — ABNORMAL LOW (ref 13.0–17.0)
Immature Granulocytes: 0 %
Lymphocytes Relative: 62 %
Lymphs Abs: 18.7 10*3/uL — ABNORMAL HIGH (ref 0.7–4.0)
MCH: 28 pg (ref 26.0–34.0)
MCHC: 31.5 g/dL (ref 30.0–36.0)
MCV: 88.9 fL (ref 80.0–100.0)
Monocytes Absolute: 2.6 10*3/uL — ABNORMAL HIGH (ref 0.1–1.0)
Monocytes Relative: 9 %
Neutro Abs: 8.8 10*3/uL — ABNORMAL HIGH (ref 1.7–7.7)
Neutrophils Relative %: 29 %
Platelets: 195 10*3/uL (ref 150–400)
RBC: 3.43 MIL/uL — ABNORMAL LOW (ref 4.22–5.81)
RDW: 17.3 % — ABNORMAL HIGH (ref 11.5–15.5)
Smear Review: NORMAL
WBC: 30.4 10*3/uL — ABNORMAL HIGH (ref 4.0–10.5)
nRBC: 0 % (ref 0.0–0.2)

## 2021-12-25 LAB — BASIC METABOLIC PANEL
Anion gap: 8 (ref 5–15)
BUN: 18 mg/dL (ref 8–23)
CO2: 27 mmol/L (ref 22–32)
Calcium: 8.6 mg/dL — ABNORMAL LOW (ref 8.9–10.3)
Chloride: 105 mmol/L (ref 98–111)
Creatinine, Ser: 1.35 mg/dL — ABNORMAL HIGH (ref 0.61–1.24)
GFR, Estimated: 55 mL/min — ABNORMAL LOW (ref >60)
Glucose, Bld: 188 mg/dL — ABNORMAL HIGH (ref 70–99)
Potassium: 3.6 mmol/L (ref 3.5–5.1)
Sodium: 140 mmol/L (ref 135–145)

## 2021-12-25 LAB — URIC ACID: Uric Acid, Serum: 5.5 mg/dL (ref 3.7–8.6)

## 2021-12-25 LAB — LACTIC ACID, PLASMA
Lactic Acid, Venous: 1.1 mmol/L (ref 0.5–1.9)
Lactic Acid, Venous: 2.1 mmol/L (ref 0.5–1.9)

## 2021-12-25 LAB — PATHOLOGIST SMEAR REVIEW

## 2021-12-25 LAB — SEDIMENTATION RATE: Sed Rate: 3 mm/hr (ref 0–20)

## 2021-12-25 LAB — APTT: aPTT: 38 seconds — ABNORMAL HIGH (ref 24–36)

## 2021-12-25 LAB — PROTIME-INR
INR: 1.2 (ref 0.8–1.2)
Prothrombin Time: 14.9 seconds (ref 11.4–15.2)

## 2021-12-25 LAB — GLUCOSE, CAPILLARY
Glucose-Capillary: 106 mg/dL — ABNORMAL HIGH (ref 70–99)
Glucose-Capillary: 308 mg/dL — ABNORMAL HIGH (ref 70–99)

## 2021-12-25 MED ORDER — AMLODIPINE BESYLATE 10 MG PO TABS
10.0000 mg | ORAL_TABLET | Freq: Every day | ORAL | Status: DC
  Administered 2021-12-25 – 2021-12-27 (×3): 10 mg via ORAL
  Filled 2021-12-25 (×3): qty 1

## 2021-12-25 MED ORDER — METRONIDAZOLE 500 MG/100ML IV SOLN
500.0000 mg | Freq: Once | INTRAVENOUS | Status: AC
  Administered 2021-12-25: 500 mg via INTRAVENOUS
  Filled 2021-12-25: qty 100

## 2021-12-25 MED ORDER — LEVOTHYROXINE SODIUM 50 MCG PO TABS
175.0000 ug | ORAL_TABLET | Freq: Every day | ORAL | Status: DC
  Administered 2021-12-26 – 2021-12-27 (×2): 175 ug via ORAL
  Filled 2021-12-25 (×2): qty 1

## 2021-12-25 MED ORDER — SODIUM CHLORIDE 0.9 % IV SOLN: INTRAVENOUS | Status: DC

## 2021-12-25 MED ORDER — SODIUM CHLORIDE 0.9 % IV SOLN
2.0000 g | Freq: Once | INTRAVENOUS | Status: AC
  Administered 2021-12-25: 2 g via INTRAVENOUS
  Filled 2021-12-25: qty 12.5

## 2021-12-25 MED ORDER — ROSUVASTATIN CALCIUM 10 MG PO TABS
10.0000 mg | ORAL_TABLET | Freq: Every day | ORAL | Status: DC
  Administered 2021-12-25 – 2021-12-27 (×3): 10 mg via ORAL
  Filled 2021-12-25 (×3): qty 1

## 2021-12-25 MED ORDER — TAMSULOSIN HCL 0.4 MG PO CAPS
0.4000 mg | ORAL_CAPSULE | Freq: Every day | ORAL | Status: DC
  Administered 2021-12-25 – 2021-12-27 (×3): 0.4 mg via ORAL
  Filled 2021-12-25 (×3): qty 1

## 2021-12-25 MED ORDER — OXYCODONE-ACETAMINOPHEN 5-325 MG PO TABS
1.0000 | ORAL_TABLET | ORAL | Status: DC | PRN
  Administered 2021-12-25 – 2021-12-27 (×4): 1 via ORAL
  Filled 2021-12-25 (×4): qty 1

## 2021-12-25 MED ORDER — VANCOMYCIN HCL 1250 MG/250ML IV SOLN
1250.0000 mg | INTRAVENOUS | Status: DC
  Filled 2021-12-25: qty 250

## 2021-12-25 MED ORDER — VANCOMYCIN HCL 2000 MG/400ML IV SOLN
2000.0000 mg | Freq: Once | INTRAVENOUS | Status: AC
  Administered 2021-12-25: 2000 mg via INTRAVENOUS
  Filled 2021-12-25 (×2): qty 400

## 2021-12-25 MED ORDER — METHYLPREDNISOLONE SODIUM SUCC 40 MG IJ SOLR
40.0000 mg | Freq: Once | INTRAMUSCULAR | Status: AC
  Administered 2021-12-25: 40 mg via INTRAVENOUS
  Filled 2021-12-25: qty 1

## 2021-12-25 MED ORDER — OXYCODONE-ACETAMINOPHEN 5-325 MG PO TABS
1.0000 | ORAL_TABLET | Freq: Once | ORAL | Status: AC
  Administered 2021-12-25: 1 via ORAL
  Filled 2021-12-25: qty 1

## 2021-12-25 MED ORDER — ACETAMINOPHEN 325 MG PO TABS: 650.0000 mg | ORAL_TABLET | Freq: Four times a day (QID) | ORAL | Status: DC | PRN

## 2021-12-25 MED ORDER — HYDRALAZINE HCL 20 MG/ML IJ SOLN: 5.0000 mg | INTRAMUSCULAR | Status: DC | PRN

## 2021-12-25 MED ORDER — INSULIN ASPART 100 UNIT/ML IJ SOLN
0.0000 [IU] | Freq: Every day | INTRAMUSCULAR | Status: DC
  Administered 2021-12-25: 4 [IU] via SUBCUTANEOUS
  Filled 2021-12-25: qty 1

## 2021-12-25 MED ORDER — VANCOMYCIN HCL IN DEXTROSE 1-5 GM/200ML-% IV SOLN: 1000.0000 mg | Freq: Once | INTRAVENOUS | Status: DC

## 2021-12-25 MED ORDER — SODIUM CHLORIDE 0.9 % IV SOLN
2.0000 g | Freq: Three times a day (TID) | INTRAVENOUS | Status: DC
  Administered 2021-12-25 – 2021-12-27 (×5): 2 g via INTRAVENOUS
  Filled 2021-12-25 (×6): qty 12.5

## 2021-12-25 MED ORDER — MORPHINE SULFATE (PF) 4 MG/ML IV SOLN
4.0000 mg | Freq: Once | INTRAVENOUS | Status: AC
  Administered 2021-12-25: 4 mg via INTRAVENOUS
  Filled 2021-12-25: qty 1

## 2021-12-25 MED ORDER — ONDANSETRON HCL 4 MG/2ML IJ SOLN
4.0000 mg | Freq: Once | INTRAMUSCULAR | Status: AC
  Administered 2021-12-25: 4 mg via INTRAVENOUS
  Filled 2021-12-25: qty 2

## 2021-12-25 MED ORDER — INSULIN ASPART 100 UNIT/ML IJ SOLN
0.0000 [IU] | Freq: Three times a day (TID) | INTRAMUSCULAR | Status: DC
  Administered 2021-12-26: 2 [IU] via SUBCUTANEOUS
  Administered 2021-12-26 (×2): 3 [IU] via SUBCUTANEOUS
  Administered 2021-12-27: 1 [IU] via SUBCUTANEOUS
  Filled 2021-12-25 (×4): qty 1

## 2021-12-25 NOTE — Consult Note (Signed)
Hematology/Oncology Consult note [phone] [phone]      Patient Care Team: [facility], [facility] as PCP - General (General Practice)   Name of the patient: Paul Briggs  [id number]  04-07-1945   REASON FOR COSULTATION:  Leukocytosis History of presenting illness-  76 y.o. male with PMH listed at below who presents to ER for evaluation of right ankle and knee, left wrist pain and swelling.  He has difficulty bearing weight. His CBC showed significant leukocytosis with WBC of 30.4, hemoglobin 11.6, platelet count 195, differential showed neutrophilia with ANC of 8.8, absolute lymphocyte 18.7, monocyte 2.6. Peripheral smear showed slight left shift with myeloid series and the lymphocytosis with abnormal morphology.  Patient is being admitted for septic arthritis.  Hematology oncology was consulted for further evaluation. Patient reports some fatigue.  His weight fluctuates a lot.  Family reports that patient does not have any good appetite recently.  He denies night sweats or fever.  Reviewing patient's previous lab results. 07/06/2021, WBC 15.9, increased absolute lymphocytes  No Known Allergies  Patient Active Problem List   Diagnosis Date Noted   Right ankle pain 12/25/2021   Leukocytosis 12/25/2021   Type II diabetes mellitus with renal manifestations ([place]) 12/25/2021   Acute renal failure superimposed on stage 3a chronic kidney disease ([place]) 12/25/2021   HLD (hyperlipidemia) 12/25/2021   HTN (hypertension) 12/25/2021   Obesity with body mass index (BMI) of 30.0 to 39.9 12/25/2021   Right knee pain 12/25/2021   Class 1 obesity without serious comorbidity with body mass index (BMI) of 30.0 to 30.9 in adult 10/02/2021   Encounter for long-term opiate analgesic use 06/04/2021   Chronic radicular lumbar pain 04/07/2021   Lumbar facet arthropathy 04/07/2021   Lumbar degenerative disc disease 04/07/2021   Primary osteoarthritis of left  shoulder 11/06/2020   Left rotator cuff tear arthropathy 11/06/2020   Spinal stenosis, lumbar region, with neurogenic claudication 11/06/2020   Chronic left shoulder pain 11/06/2020   Chronic pain syndrome 11/06/2020   Pain management contract signed 11/06/2020   OSA on CPAP 08/25/2020   CPAP use counseling 08/25/2020   Disorder of rotator cuff, left 06/10/2020   OSA (obstructive sleep apnea) 05/05/2020   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   COVID 02/19/2020   Headache above the eye region 09/11/2019   Adrenal nodule ([place]) 10/28/2015   Subdural hematoma ([place]) 10/24/2015   Sepsis ([place]) 07/19/2015   Essential hypertension 07/19/2015   Diabetes mellitus ([place]) 07/19/2015   Hypothyroidism 07/19/2015   Peripheral neuropathy 07/19/2015   Chronic back pain 07/19/2015   CKD (chronic kidney disease) stage 3, GFR 30-59 ml/min ([facility]) 07/19/2015   Enlarged prostate 03/25/2015   Urine stream spraying 03/25/2015   BPH with obstruction/lower urinary tract symptoms 09/18/2014   Other male erectile dysfunction 09/18/2014   Sleep apnea 04/04/2014     Past Medical History:  Diagnosis Date   Acute prostatitis    BPH (benign prostatic hyperplasia)    Chronic kidney disease    Dysplasia of prostate    Erectile dysfunction    HTN (hypertension)    Hypogonadism in male    Orchitis and epididymitis    Over weight    Palindromic rheumatism, hand    Prostatitis    Rectum pain    Testicle swelling    Testicle tenderness    Testicular mass      Past Surgical History:  Procedure Laterality Date   BACK SURGERY     head surgery  from fall  drilled hole in brain to relieve pressure    Social History   Socioeconomic History   Marital status: Married    Spouse name: Not on file   Number of children: Not on file   Years of education: Not on file   Highest education level: Not on file  Occupational History   Not on file  Tobacco Use   Smoking  status: Former   Smokeless tobacco: Never   Tobacco comments:    quit 30 years ago  Vaping Use   Vaping Use: Never used  Substance and Sexual Activity   Alcohol use: No    Alcohol/week: 0.0 standard drinks of alcohol   Drug use: No   Sexual activity: Not on file  Other Topics Concern   Not on file  Social History Narrative   Not on file   Social Determinants of Health   Financial Resource Strain: Not on file  Food Insecurity: Not on file  Transportation Needs: Not on file  Physical Activity: Not on file  Stress: Not on file  Social Connections: Not on file  Intimate Partner Violence: Not on file     Family History  Problem Relation Age of Onset   Cancer Maternal Aunt    Prostate cancer Neg Hx    Kidney disease Neg Hx    Kidney cancer Neg Hx    Bladder Cancer Neg Hx      Current Facility-Administered Medications:    0.9 %  sodium chloride infusion, , Intravenous, Continuous, Ivor Costa, MD   acetaminophen (TYLENOL) tablet 650 mg, 650 mg, Oral, Q6H PRN, Ivor Costa, MD   hydrALAZINE (APRESOLINE) injection 5 mg, 5 mg, Intravenous, Q2H PRN, Ivor Costa, MD   metroNIDAZOLE (FLAGYL) IVPB 500 mg, 500 mg, Intravenous, Once, Lavonia Drafts, MD, Last Rate: 100 mL/hr at 12/25/21 1531, 500 mg at 12/25/21 1531   oxyCODONE-acetaminophen (PERCOCET/ROXICET) 5-325 MG per tablet 1 tablet, 1 tablet, Oral, Q4H PRN, Ivor Costa, MD   vancomycin (VANCOREADY) IVPB 2000 mg/400 mL, 2,000 mg, Intravenous, Once, Darrick Penna, [facility], Last Rate: 200 mL/hr at 12/25/21 1618, 2,000 mg at 12/25/21 1618  Current Outpatient Medications:    amLODipine (NORVASC) 10 MG tablet, Take 10 mg by mouth daily., Disp: , Rfl:    levothyroxine (SYNTHROID) 175 MCG tablet, Take 1 tablet (175 mcg total) by mouth daily., Disp: 90 tablet, Rfl: 1   metFORMIN (GLUCOPHAGE) 500 MG tablet, Take 500 mg by mouth 2 (two) times daily with a meal., Disp: , Rfl:    [START ON 01/30/2022] oxyCODONE-acetaminophen (PERCOCET) 10-325 MG  tablet, Take 1 tablet by mouth every 8 (eight) hours as needed., Disp: 90 tablet, Rfl: 0   potassium chloride (KLOR-CON M) 10 MEQ tablet, Take 10 mEq by mouth 2 (two) times daily., Disp: , Rfl:    rosuvastatin (CRESTOR) 10 MG tablet, Take 1 tablet by mouth once daily, Disp: 90 tablet, Rfl: 0   tamsulosin (FLOMAX) 0.4 MG CAPS capsule, Take 1 capsule (0.4 mg total) by mouth daily., Disp: 90 capsule, Rfl: 3   valsartan-hydrochlorothiazide (DIOVAN-HCT) 320-25 MG tablet, Take 1 tablet by mouth daily., Disp: 90 tablet, Rfl: 3   Aspirin-Salicylamide-Caffeine (ARTHRITIS STRENGTH BC POWDER PO), Take by mouth., Disp: , Rfl:    Bisacodyl (LAXATIVE PO), Take 1 each by mouth daily at 2 PM. (Patient not taking: Reported on 12/25/2021), Disp: , Rfl:    BLACK CURRANT SEED OIL PO, Take by mouth daily. (Patient not taking: Reported on 12/25/2021), Disp: , Rfl:  meloxicam (MOBIC) 7.5 MG tablet, Take 7.5 mg by mouth 2 (two) times daily. (Patient not taking: Reported on 12/25/2021), Disp: , Rfl:    oxyCODONE-acetaminophen (PERCOCET) 10-325 MG tablet, Take 1 tablet by mouth every 8 (eight) hours as needed., Disp: 90 tablet, Rfl: 0   [START ON 12/31/2021] oxyCODONE-acetaminophen (PERCOCET) 10-325 MG tablet, Take 1 tablet by mouth every 8 (eight) hours as needed., Disp: 90 tablet, Rfl: 0  Review of Systems  Constitutional:  Positive for fatigue. Negative for appetite change, chills and fever.  HENT:   Negative for hearing loss and voice change.   Eyes:  Negative for eye problems and icterus.  Respiratory:  Negative for chest tightness, cough and shortness of breath.   Cardiovascular:  Negative for chest pain and leg swelling.  Gastrointestinal:  Negative for abdominal distention and abdominal pain.  Endocrine: Negative for hot flashes.  Genitourinary:  Negative for difficulty urinating, dysuria and frequency.   Musculoskeletal:  Positive for arthralgias.  Skin:  Negative for itching and rash.  Neurological:  Negative  for light-headedness and numbness.  Hematological:  Negative for adenopathy. Does not bruise/bleed easily.  Psychiatric/Behavioral:  Negative for confusion.     PHYSICAL EXAM Vitals:   12/25/21 1052 12/25/21 1156  BP: (!) 151/93   Pulse: 76   Resp: 20   Temp: 99.5 F (37.5 C)   SpO2: 97%   Weight:  104.8 kg  Height:  '6\' 1"'$  (1.854 m)   Physical Exam Constitutional:      General: He is not in acute distress.    Appearance: He is not diaphoretic.  HENT:     Nose: Nose normal.     Mouth/Throat:     Pharynx: No oropharyngeal exudate.  Eyes:     General: No scleral icterus.    Pupils: Pupils are equal, round, and reactive to light.  Cardiovascular:     Rate and Rhythm: Normal rate.     Heart sounds: No murmur heard. Pulmonary:     Effort: Pulmonary effort is normal. No respiratory distress.     Breath sounds: No wheezing.  Abdominal:     General: There is no distension.     Palpations: Abdomen is soft.     Tenderness: There is no abdominal tenderness.  Musculoskeletal:        General: Swelling present. Normal range of motion.     Cervical back: Normal range of motion and neck supple.     Comments: tenderness and swelling in right knee, right ankle and left wrist  Lymphadenopathy:     Cervical: No cervical adenopathy.  Skin:    General: Skin is warm and dry.     Findings: No erythema.  Neurological:     Mental Status: He is alert and oriented to person, place, and time.     Cranial Nerves: No cranial nerve deficit.     Motor: No abnormal muscle tone.  Psychiatric:        Mood and Affect: Mood and affect normal.       LABORATORY STUDIES    Latest Ref Rng & Units 12/25/2021   12:50 PM 07/06/2021   10:30 AM 10/22/2015   11:09 AM  CBC  WBC 4.0 - 10.5 K/uL 30.4  15.9  12.9   Hemoglobin 13.0 - 17.0 g/dL 9.6  10.3  11.6   Hematocrit 39.0 - 52.0 % 30.5  33.4  34.7   Platelets 150 - 400 K/uL 195  174  155  Latest Ref Rng & Units 12/25/2021   12:50 PM 07/06/2021    10:30 AM 10/22/2015   11:09 AM  CMP  Glucose 70 - 99 mg/dL 188  136  125   BUN 8 - 23 mg/dL '18  19  16   '$ Creatinine 0.61 - 1.24 mg/dL 1.35  1.08  1.25   Sodium 135 - 145 mmol/L 140  141  138   Potassium 3.5 - 5.1 mmol/L 3.6  4.1  3.3   Chloride 98 - 111 mmol/L 105  107  107   CO2 22 - 32 mmol/L '27  25  27   '$ Calcium 8.9 - 10.3 mg/dL 8.6  9.1  8.8   Total Protein 6.1 - 8.1 g/dL  6.4  7.3   Total Bilirubin 0.2 - 1.2 mg/dL  0.7  1.5   Alkaline Phos 38 - 126 U/L   73   AST 10 - 35 U/L  37  47   ALT 9 - 46 U/L  26  31      RADIOGRAPHIC STUDIES: I have personally reviewed the radiological images as listed and agreed with the findings in the report. DG Wrist Complete Left  Result Date: 12/25/2021 CLINICAL DATA:  Left wrist pain and swelling.  No known trauma. EXAM: LEFT WRIST - COMPLETE 3+ VIEW COMPARISON:  None Available. FINDINGS: There is 4 mm ulnar negative variance with mild distal radioulnar joint space narrowing and peripheral osteophytosis. Mild chondrocalcinosis within the distal aspect of the triangular fibrocartilage complex. Mild triscaphe and thumb carpometacarpal joint space narrowing and peripheral osteophytosis. No acute fracture or dislocation. IMPRESSION: 1. 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 2. Mild triscaphe and thumb carpometacarpal osteoarthritis. 3. Mild chondrocalcinosis within the distal aspect of the triangular fibrocartilage complex. Electronically Signed   By: Yvonne Kendall M.D.   On: 12/25/2021 11:43   DG Ankle Complete Right  Result Date: 12/25/2021 CLINICAL DATA:  Right ankle pain and swelling. EXAM: RIGHT ANKLE - COMPLETE 3+ VIEW COMPARISON:  Right foot radiographs 12/23/2008, right tibia and fibula radiographs 12/23/2008 FINDINGS: The ankle mortise is symmetric and intact. Mild-to-moderate distal medial malleolar degenerative spurring. Moderate plantar and posterior calcaneal heel spurs, slightly increased from remote 2010  radiographs. Moderate dorsal talonavicular, navicular-cuneiform, and tarsometatarsal degenerative osteophytes on lateral view. No acute fracture or dislocation. Mild-to-moderate medial and mild lateral ankle soft tissue swelling. IMPRESSION: 1. Mild-to-moderate medial and mild lateral ankle soft tissue swelling. No acute fracture. 2. Moderate plantar and posterior calcaneal heel spurs. Electronically Signed   By: Yvonne Kendall M.D.   On: 12/25/2021 11:42     Assessment and plan-   Right knee pain, left wrist pain and right ankle pain and swelling Arthrocentesis was performed, pending synovial fluid analysis.  Differential diagnosis includes septic arthritis, gout arthritis.  Orthopedics surgeon has been consulted. Continue empiric antibiotics, pain medication.  # Leukocytosis, acute on chronic. Peripheral blood smear showed abnormal lymphocytosis, differential diagnosis includes CLL Physical examination did not reveal any significant peripheral lymphadenopathy. Recommend peripheral blood flow cytometry, hepatitis, HIV, Patient can follow-up outpatient to go over results.  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  Thank you for allowing me to participate in the care of this patient.   Earlie Server, MD, PhD Hematology Oncology 12/25/2021

## 2021-12-25 NOTE — Consult Note (Signed)
ORTHOPAEDIC CONSULTATION  REQUESTING PHYSICIAN: Ivor Costa, MD  Chief Complaint: Right knee and ankle pain  HPI: Jatavius Ellenwood [name] is a 76 y.o. male who complains of right knee and ankle pain and swelling over the last 24 hours as well as recent left wrist pain. The patient has had some difficulty with ambulation over the last day.  Patient reports he was recently doing physical therapy for his back and feels that is when he injured the left wrist.  Pain is worse in the right ankle.  Patient reports no history of gout.  Past medical history notable for hypertension, hyperlipidemia, diabetes mellitus, hypothyroidism, testicular mass, CKD-3, BPH, prostatitis, orchitis, epididymitis, SDH, OSA, chronic pain, obesity obesity BMI 30.48.  Orthopedics was consulted regarding possible right knee septic arthritis. Aspiration was performed in the ER with 10 cc of hazy fluid, 296 cells, 72% neutrophils.  Gram stain and cultures are pending.  Oncology was also consulted given concern for leukemia.    Past Medical History:  Diagnosis Date   Acute prostatitis    BPH (benign prostatic hyperplasia)    Chronic kidney disease    Dysplasia of prostate    Erectile dysfunction    HTN (hypertension)    Hypogonadism in male    Orchitis and epididymitis    Over weight    Palindromic rheumatism, hand    Prostatitis    Rectum pain    Testicle swelling    Testicle tenderness    Testicular mass    Past Surgical History:  Procedure Laterality Date   BACK SURGERY     head surgery     from fall  drilled hole in brain to relieve pressure   Social History   Socioeconomic History   Marital status: Married    Spouse name: Not on file   Number of children: Not on file   Years of education: Not on file   Highest education level: Not on file  Occupational History   Not on file  Tobacco Use   Smoking status: Former   Smokeless tobacco: Never   Tobacco comments:    quit 30 years ago  Vaping  Use   Vaping Use: Never used  Substance and Sexual Activity   Alcohol use: No    Alcohol/week: 0.0 standard drinks of alcohol   Drug use: No   Sexual activity: Not on file  Other Topics Concern   Not on file  Social History Narrative   Not on file   Social Determinants of Health   Financial Resource Strain: Not on file  Food Insecurity: Not on file  Transportation Needs: Not on file  Physical Activity: Not on file  Stress: Not on file  Social Connections: Not on file   Family History  Problem Relation Age of Onset   Cancer Maternal Aunt    Prostate cancer Neg Hx    Kidney disease Neg Hx    Kidney cancer Neg Hx    Bladder Cancer Neg Hx    No Known Allergies Prior to Admission medications   Medication Sig Start Date End Date Taking? Authorizing Provider  amLODipine (NORVASC) 10 MG tablet Take 10 mg by mouth daily.   Yes [provider]  levothyroxine (SYNTHROID) 175 MCG tablet Take 1 tablet (175 mcg total) by mouth daily. 08/11/21  Yes Masoud, Viann Shove, MD  metFORMIN (GLUCOPHAGE) 500 MG tablet Take 500 mg by mouth 2 (two) times daily with a meal.   Yes [provider]  oxyCODONE-acetaminophen (PERCOCET) 10-325 MG  tablet Take 1 tablet by mouth every 8 (eight) hours as needed. 01/30/22 03/01/22 Yes Gillis Santa, MD  potassium chloride (KLOR-CON M) 10 MEQ tablet Take 10 mEq by mouth 2 (two) times daily.   Yes [provider]  rosuvastatin (CRESTOR) 10 MG tablet Take 1 tablet by mouth once daily 11/25/21  Yes Masoud, Viann Shove, MD  tamsulosin (FLOMAX) 0.4 MG CAPS capsule Take 1 capsule (0.4 mg total) by mouth daily. 10/02/21  Yes McGowan, Larene Beach A, PA-C  valsartan-hydrochlorothiazide (DIOVAN-HCT) 320-25 MG tablet Take 1 tablet by mouth daily. 09/25/21  Yes Theresia Lo, NP  Aspirin-Salicylamide-Caffeine (ARTHRITIS STRENGTH BC POWDER PO) Take by mouth.    [provider]  Bisacodyl (LAXATIVE PO) Take 1 each by mouth daily at 2 PM. Patient not taking:  Reported on 12/25/2021    [provider]  BLACK CURRANT SEED OIL PO Take by mouth daily. Patient not taking: Reported on 12/25/2021    [provider]  meloxicam (MOBIC) 7.5 MG tablet Take 7.5 mg by mouth 2 (two) times daily. Patient not taking: Reported on 12/25/2021 12/23/21   [provider]  oxyCODONE-acetaminophen (PERCOCET) 10-325 MG tablet Take 1 tablet by mouth every 8 (eight) hours as needed. 12/01/21 12/31/21  Gillis Santa, MD  oxyCODONE-acetaminophen (PERCOCET) 10-325 MG tablet Take 1 tablet by mouth every 8 (eight) hours as needed. 12/31/21 01/30/22  Gillis Santa, MD   US Venous Img Lower Unilateral Right  Result Date: 12/25/2021 CLINICAL DATA:  Right lower extremity swelling and pain for 5 days. EXAM: RIGHT LOWER EXTREMITY VENOUS DOPPLER ULTRASOUND TECHNIQUE: Gray-scale sonography with compression, as well as color and duplex ultrasound, were performed to evaluate the deep venous system(s) from the level of the common femoral vein through the popliteal and proximal calf veins. COMPARISON:  None Available. FINDINGS: VENOUS Normal compressibility of the common femoral, superficial femoral, and popliteal veins, as well as the visualized calf veins. Visualized portions of profunda femoral vein and great saphenous vein unremarkable. No filling defects to suggest DVT on grayscale or color Doppler imaging. Doppler waveforms show normal direction of venous flow, normal respiratory plasticity and response to augmentation. Limited views of the contralateral common femoral vein are unremarkable. OTHER Normal morphology right groin lymph nodes are identified, largest measures 2 cm. Limitations: none IMPRESSION: Negative. Electronically Signed   By: Abelardo Diesel M.D.   On: 12/25/2021 16:19   DG Wrist Complete Left  Result Date: 12/25/2021 CLINICAL DATA:  Left wrist pain and swelling.  No known trauma. EXAM: LEFT WRIST - COMPLETE 3+ VIEW COMPARISON:  None Available. FINDINGS:  There is 4 mm ulnar negative variance with mild distal radioulnar joint space narrowing and peripheral osteophytosis. Mild chondrocalcinosis within the distal aspect of the triangular fibrocartilage complex. Mild triscaphe and thumb carpometacarpal joint space narrowing and peripheral osteophytosis. No acute fracture or dislocation. IMPRESSION: 1. 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 2. Mild triscaphe and thumb carpometacarpal osteoarthritis. 3. Mild chondrocalcinosis within the distal aspect of the triangular fibrocartilage complex. Electronically Signed   By: Yvonne Kendall M.D.   On: 12/25/2021 11:43   DG Ankle Complete Right  Result Date: 12/25/2021 CLINICAL DATA:  Right ankle pain and swelling. EXAM: RIGHT ANKLE - COMPLETE 3+ VIEW COMPARISON:  Right foot radiographs 12/23/2008, right tibia and fibula radiographs 12/23/2008 FINDINGS: The ankle mortise is symmetric and intact. Mild-to-moderate distal medial malleolar degenerative spurring. Moderate plantar and posterior calcaneal heel spurs, slightly increased from remote 2010 radiographs. Moderate dorsal talonavicular, navicular-cuneiform, and tarsometatarsal degenerative  osteophytes on lateral view. No acute fracture or dislocation. Mild-to-moderate medial and mild lateral ankle soft tissue swelling. IMPRESSION: 1. Mild-to-moderate medial and mild lateral ankle soft tissue swelling. No acute fracture. 2. Moderate plantar and posterior calcaneal heel spurs. Electronically Signed   By: Yvonne Kendall M.D.   On: 12/25/2021 11:42    Positive ROS: All other systems have been reviewed and were otherwise negative with the exception of those mentioned in the HPI and as above.  Physical Exam: General: Alert, no acute distress Cardiovascular: No pedal edema Respiratory: No cyanosis, no use of accessory musculature GI: No organomegaly, abdomen is soft and non-tender Skin: No lesions in the area of chief complaint Neurologic:  Sensation intact distally Psychiatric: Patient is competent for consent with normal mood and affect Lymphatic: No axillary or cervical lymphadenopathy  MUSCULOSKELETAL:   Left wrist: No significant swelling.  There is mild tenderness over the dorsal wrist.  Tolerates limited passive flexion and extension of the wrist.  Able to make a composite fist.  Right knee: Mild effusion, mild tenderness to palpation.  Tolerates gentle range of motion from 0 to 30 degrees, however more passive range of motion causes discomfort, especially in the ankle.  Right ankle: Mild lower extremity swelling tenderness along the medial and lateral aspect of the ankle.  Difficulty with passive ankle dorsiflexion/plantarflexion secondary to discomfort. Right lower extremity is grossly motor and sensory intact   Assessment: 76 year old male admitted with left wrist, right knee and right ankle pain.  Synovial fluid aspiration of the right knee in the ER revealed 296 cells, 22% neutrophils, Gram stain and cultures pending.  Patient has been started on vancomycin and cefepime as well as Solu-Medrol.  Oncology consulted given concern for leukemia.  Plan: Initial right knee aspirate does not show significant concern for infection.  Will continue to follow for clinical improvement as well as right knee synovial fluid cultures.  May consider IR aspiration of right ankle as well as left wrist if symptoms progress or worsen over the next 24 to 48 hours.    Renee Harder, MD    12/25/2021 5:37 PM

## 2021-12-25 NOTE — ED Provider Notes (Signed)
.  Joint Aspiration/Arthrocentesis  Date/Time: 12/25/2021 2:41 PM  Performed by: Lavonia Drafts, MD Authorized by: Lavonia Drafts, MD   Consent:    Consent obtained:  Verbal   Consent given by:  Patient   Risks, benefits, and alternatives were discussed: yes     Risks discussed:  Bleeding, infection and pain Universal protocol:    Patient identity confirmed:  Verbally with patient and arm band Location:    Location:  Knee   Knee:  R knee Anesthesia:    Anesthesia method:  Local infiltration Procedure details:    Preparation: Patient was prepped and draped in usual sterile fashion     Needle gauge:  26 G   Approach:  Anterior   Aspirate amount:  10   Aspirate characteristics:  Yellow and cloudy   Steroid injected: no   Post-procedure details:    Dressing:  Adhesive bandage   Procedure completion:  Tolerated well, no immediate complications     Lavonia Drafts, MD 12/25/21 1442

## 2021-12-25 NOTE — Progress Notes (Signed)
Patient is alert and oriented x4. Walked into patient's room and he was up and in the bathroom having a bowel movement. He had disconnected his IV and the line was left leaking into the bed. Changed his fitted sheet and educated him about using his call button for assistance-placed bed alarm on. Restarted him a new bag of fluids w/new IV line due to infection risk with disconnected line. Blood glucose was 308-gave 4 units of insulin. He denied pain and additional needs.

## 2021-12-25 NOTE — Consult Note (Signed)
PHARMACY -  BRIEF ANTIBIOTIC NOTE   Pharmacy has received consult(s) for vancomycin and cefepime from an ED provider.  The patient's profile has been reviewed for ht/wt/allergies/indication/available labs.    One time order(s) placed for vancomycin 2g IV x1 and cefepime 2g IV x1  Further antibiotics/pharmacy consults should be ordered by admitting physician if indicated.                       Thank you, Darrick Penna 12/25/2021  3:19 PM

## 2021-12-25 NOTE — ED Triage Notes (Signed)
Pt comes via [facility] from home with c/o right ankle pain. Pt denies any falls. Pt has swelling redness and warmth noted. Pt also left wrist pain and swelling.

## 2021-12-25 NOTE — H&P (Signed)
History and Physical    Paul Briggs TFT:[id number] DOB: [birth year] DOA: 12/25/2021  Referring MD/NP/PA:   PCP: [facility]   Briggs coming from:  Paul Briggs is coming from home.  At baseline, pt is independent for most of ADL.        Chief Complaint:  right ankle and knee pain  HPI: Paul Buffalo [name] is a 76 y.o. male with medical history significant of hypertension, hyperlipidemia, diabetes mellitus, hypothyroidism, testicular mass, CKD-3, BPH, prostatitis, orchitis, epididymitis, SDH, OSA, chronic pain, obesity obesity BMI 30.48, who presents with pain in right ankle and right knee and left wrist.  Briggs states that he has pain and swelling in right knee, right ankle and left wrist for 5 days.  Paul pain is constant, moderate to severe, sharp, nonradiating.  No injury.  No fever or chills.  Briggs does not have chest pain, cough, short of breath.  No nausea vomiting, diarrhea or abdominal pain.  No symptoms of UTI.  Data reviewed independently and ED Course: pt was found to have WBC 30.4, with neutrophil 29% and lymphocyte 62%.  Peripheral smear showed leukocytosis with slight left shift in myeloid series and lymphocytosis with abnormal morphology.  Uric acid 5.5, worsening renal function with creatinine 1.35, BUN 18, GFR 55 (baseline creatinine 1.08 on 07/06/2021), lactic acid 1.1.  Temperature 99.5, blood pressure 151/93, heart rate 76, RR 20, oxygen saturation 97% on room air.  Briggs is admitted to [place] bed as inpatient. Dr. Sharlet Salina of Ortho and Dr. Tasia Catchings of oncology is consulted  EKG:  Not done in ED, will get one.     Review of Systems:   General: no fevers, chills, no body weight gain, has fatigue HEENT: no blurry vision, hearing changes or sore throat Respiratory: no dyspnea, coughing, wheezing CV: no chest pain, no palpitations GI: no nausea, vomiting, abdominal pain, diarrhea, constipation GU: no dysuria, burning on urination,  increased urinary frequency, hematuria  Ext: no leg edema Neuro: no unilateral weakness, numbness, or tingling, no vision change or hearing loss Skin: no rash, no skin tear. MSK: has pain and swelling in right knee, right ankle and left wrist Heme: No easy bruising.  Travel history: No recent long distant travel.   Allergy: No Known Allergies  Past Medical History:  Diagnosis Date   Acute prostatitis    BPH (benign prostatic hyperplasia)    Chronic kidney disease    Dysplasia of prostate    Erectile dysfunction    HTN (hypertension)    Hypogonadism in male    Orchitis and epididymitis    Over weight    Palindromic rheumatism, hand    Prostatitis    Rectum pain    Testicle swelling    Testicle tenderness    Testicular mass     Past Surgical History:  Procedure Laterality Date   BACK SURGERY     head surgery     from fall  drilled hole in brain to relieve pressure    Social History:  reports that he has quit smoking. He has never used smokeless tobacco. He reports that he does not drink alcohol and does not use drugs.  Family History:  Family History  Problem Relation Age of Onset   Cancer Maternal Aunt    Prostate cancer Neg Hx    Kidney disease Neg Hx    Kidney cancer Neg Hx    Bladder Cancer Neg Hx      Prior to Admission medications  Medication Sig Start Date End Date Taking? Authorizing Provider  Aspirin-Salicylamide-Caffeine (ARTHRITIS STRENGTH BC POWDER PO) Take by mouth.    [provider]  Bisacodyl (LAXATIVE PO) Take 1 each by mouth daily at 2 PM.    [provider]  BLACK CURRANT SEED OIL PO Take by mouth daily.    [provider]  Blood Glucose Calibration (TRUE METRIX LEVEL 1) Low SOLN 1 Bottle by In Vitro route daily. 09/07/19   Cletis Athens, MD  Blood Glucose Monitoring Suppl (TRUE METRIX METER) DEVI Check blood sugar daily 08/31/19   Cletis Athens, MD  glucose blood (TRUE METRIX PRO BLOOD GLUCOSE) test strip Use as  instructed 08/31/19   Cletis Athens, MD  Lancets 33G MISC Check blood sugar daily 08/31/19   Cletis Athens, MD  levothyroxine (SYNTHROID) 175 MCG tablet Take 1 tablet (175 mcg total) by mouth daily. 08/11/21   Cletis Athens, MD  oxyCODONE-acetaminophen (PERCOCET) 10-325 MG tablet Take 1 tablet by mouth every 8 (eight) hours as needed. 12/01/21 12/31/21  Gillis Santa, MD  oxyCODONE-acetaminophen (PERCOCET) 10-325 MG tablet Take 1 tablet by mouth every 8 (eight) hours as needed. 12/31/21 01/30/22  Gillis Santa, MD  oxyCODONE-acetaminophen (PERCOCET) 10-325 MG tablet Take 1 tablet by mouth every 8 (eight) hours as needed. 01/30/22 03/01/22  Gillis Santa, MD  rosuvastatin (CRESTOR) 10 MG tablet Take 1 tablet by mouth once daily 11/25/21   Cletis Athens, MD  tamsulosin (FLOMAX) 0.4 MG CAPS capsule Take 1 capsule (0.4 mg total) by mouth daily. 10/02/21   McGowan, Larene Beach A, PA-C  valsartan-hydrochlorothiazide (DIOVAN-HCT) 320-25 MG tablet Take 1 tablet by mouth daily. 09/25/21   Theresia Lo, NP    Physical Exam: Vitals:   12/25/21 1052 12/25/21 1156  BP: (!) 151/93   Pulse: 76   Resp: 20   Temp: 99.5 F (37.5 C)   SpO2: 97%   Weight:  104.8 kg  Height:  '6\' 1"'$  (1.854 m)   General: Not in acute distress HEENT:       Eyes: PERRL, EOMI, no scleral icterus.       ENT: No discharge from Paul ears and nose, no pharynx injection, no tonsillar enlargement.        Neck: No JVD, no bruit, no mass felt. Heme: No neck lymph node enlargement. Cardiac: S1/S2, RRR, No murmurs, No gallops or rubs. Respiratory: No rales, wheezing, rhonchi or rubs. GI: Soft, nondistended, nontender, no rebound pain, no organomegaly, BS present. GU: No hematuria Ext: No pitting leg edema bilaterally. 1+DP/PT pulse bilaterally. Musculoskeletal: has tenderness and swelling in right knee, right ankle and left wrist Skin: No rashes.  Neuro: Alert, oriented X3, cranial nerves II-XII grossly intact, moves all extremities normally. Psych:  Briggs is not psychotic, no suicidal or hemocidal ideation.  Labs on Admission: I have personally reviewed following labs and imaging studies  CBC: Recent Labs  Lab 12/25/21 1250  WBC 30.4*  NEUTROABS 8.8*  HGB 9.6*  HCT 30.5*  MCV 88.9  PLT 419   Basic Metabolic Panel: Recent Labs  Lab 12/25/21 1250  NA 140  K 3.6  CL 105  CO2 27  GLUCOSE 188*  BUN 18  CREATININE 1.35*  CALCIUM 8.6*   GFR: Estimated Creatinine Clearance: 60.1 mL/min (A) (by C-G formula based on SCr of 1.35 mg/dL (H)). Liver Function Tests: No results for input(s): "AST", "ALT", "ALKPHOS", "BILITOT", "PROT", "ALBUMIN" in Paul last 168 hours. No results for input(s): "LIPASE", "AMYLASE" in Paul last 168 hours. No results for  input(s): "AMMONIA" in Paul last 168 hours. Coagulation Profile: No results for input(s): "INR", "PROTIME" in Paul last 168 hours. Cardiac Enzymes: No results for input(s): "CKTOTAL", "CKMB", "CKMBINDEX", "TROPONINI" in Paul last 168 hours. BNP (last 3 results) No results for input(s): "PROBNP" in Paul last 8760 hours. HbA1C: No results for input(s): "HGBA1C" in Paul last 72 hours. CBG: No results for input(s): "GLUCAP" in Paul last 168 hours. Lipid Profile: No results for input(s): "CHOL", "HDL", "LDLCALC", "TRIG", "CHOLHDL", "LDLDIRECT" in Paul last 72 hours. Thyroid Function Tests: No results for input(s): "TSH", "T4TOTAL", "FREET4", "T3FREE", "THYROIDAB" in Paul last 72 hours. Anemia Panel: No results for input(s): "VITAMINB12", "FOLATE", "FERRITIN", "TIBC", "IRON", "RETICCTPCT" in Paul last 72 hours. Urine analysis:    Component Value Date/Time   COLORURINE YELLOW (A) 07/19/2015 2240   APPEARANCEUR Clear 10/04/2019 1128   LABSPEC 1.023 07/19/2015 2240   LABSPEC 1.019 06/16/2013 1607   PHURINE 5.0 07/19/2015 2240   GLUCOSEU 2+ (A) 10/04/2019 1128   GLUCOSEU Negative 06/16/2013 1607   HGBUR 1+ (A) 07/19/2015 2240   BILIRUBINUR Negative 07/24/2020 1013   BILIRUBINUR Negative  10/04/2019 1128   BILIRUBINUR Negative 06/16/2013 1607   KETONESUR 1+ (A) 07/19/2015 2240   PROTEINUR Positive (A) 07/24/2020 1013   PROTEINUR Negative 10/04/2019 1128   PROTEINUR NEGATIVE 07/19/2015 2240   UROBILINOGEN 0.2 07/24/2020 1013   NITRITE Negative 07/24/2020 1013   NITRITE Negative 10/04/2019 1128   NITRITE NEGATIVE 07/19/2015 2240   LEUKOCYTESUR Negative 07/24/2020 1013   LEUKOCYTESUR Negative 10/04/2019 1128   LEUKOCYTESUR Negative 06/16/2013 1607   Sepsis Labs: '@LABRCNTIP'$ (procalcitonin:4,lacticidven:4) )No results found for this or any previous visit (from Paul past 240 hour(s)).   Radiological Exams on Admission: US Venous Img Lower Unilateral Right  Result Date: 12/25/2021 CLINICAL DATA:  Right lower extremity swelling and pain for 5 days. EXAM: RIGHT LOWER EXTREMITY VENOUS DOPPLER ULTRASOUND TECHNIQUE: Gray-scale sonography with compression, as well as color and duplex ultrasound, were performed to evaluate Paul deep venous system(s) from Paul level of Paul common femoral vein through Paul popliteal and proximal calf veins. COMPARISON:  None Available. FINDINGS: VENOUS Normal compressibility of Paul common femoral, superficial femoral, and popliteal veins, as well as Paul visualized calf veins. Visualized portions of profunda femoral vein and great saphenous vein unremarkable. No filling defects to suggest DVT on grayscale or color Doppler imaging. Doppler waveforms show normal direction of venous flow, normal respiratory plasticity and response to augmentation. Limited views of Paul contralateral common femoral vein are unremarkable. OTHER Normal morphology right groin lymph nodes are identified, largest measures 2 cm. Limitations: none IMPRESSION: Negative. Electronically Signed   By: Abelardo Diesel M.D.   On: 12/25/2021 16:19   DG Wrist Complete Left  Result Date: 12/25/2021 CLINICAL DATA:  Left wrist pain and swelling.  No known trauma. EXAM: LEFT WRIST - COMPLETE 3+ VIEW  COMPARISON:  None Available. FINDINGS: There is 4 mm ulnar negative variance with mild distal radioulnar joint space narrowing and peripheral osteophytosis. Mild chondrocalcinosis within Paul distal aspect of Paul triangular fibrocartilage complex. Mild triscaphe and thumb carpometacarpal joint space narrowing and peripheral osteophytosis. No acute fracture or dislocation. IMPRESSION: 1. 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 2. Mild triscaphe and thumb carpometacarpal osteoarthritis. 3. Mild chondrocalcinosis within Paul distal aspect of Paul triangular fibrocartilage complex. Electronically Signed   By: Yvonne Kendall M.D.   On: 12/25/2021 11:43   DG Ankle Complete Right  Result Date: 12/25/2021 CLINICAL DATA:  Right ankle pain and swelling.  EXAM: RIGHT ANKLE - COMPLETE 3+ VIEW COMPARISON:  Right foot radiographs 12/23/2008, right tibia and fibula radiographs 12/23/2008 FINDINGS: Paul ankle mortise is symmetric and intact. Mild-to-moderate distal medial malleolar degenerative spurring. Moderate plantar and posterior calcaneal heel spurs, slightly increased from remote 2010 radiographs. Moderate dorsal talonavicular, navicular-cuneiform, and tarsometatarsal degenerative osteophytes on lateral view. No acute fracture or dislocation. Mild-to-moderate medial and mild lateral ankle soft tissue swelling. IMPRESSION: 1. Mild-to-moderate medial and mild lateral ankle soft tissue swelling. No acute fracture. 2. Moderate plantar and posterior calcaneal heel spurs. Electronically Signed   By: Yvonne Kendall M.D.   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  Arthrocentesis was performed, pending synovial fluid analysis.  Consulted to Dr. Sharlet Salina of Ortho.  Potential differential diagnosis including septic joint and gout.  -Admitted to MedSurg bed as inpatient -Empiric antibiotics with vancomycin and cefepime (Briggs received 1 dose of Flagyl in ED) -As needed Percocet, Tylenol for pain - will give one dose of Solu-Medrol 40 mg  Leukocytosis: Concerning for lymphocytic leukemia -Consult with Dr. Tasia Catchings of oncology  Essential hypertension: -IV hydralazine as needed -Hold Diovan/HCTZ due to worsening renal function -Amlodipine  Type II diabetes mellitus with renal manifestations ([place]): Recent A1c 6.5.  Well-controlled.  Briggs taking metformin at home -Sliding scale insulin  BPH with obstruction/lower urinary tract symptoms -Flomax  Acute renal failure superimposed on stage 3a chronic kidney disease ([place]): -Hold Diovan-HCTZ -Normal saline 75 cc/h  HLD (hyperlipidemia) -[place]  Hypothyroidism -Synthroid  Obesity with body mass index (BMI) of 30.0 to 39.9:  BMI= 30.48   and BW= 104.8 -Diet and exercise.   -Encouraged to lose weight.      DVT ppx: SCD  Code Status: Full code  Family Communication: Yes, Briggs's daughter and wife   at bed side.      Disposition Plan:  Anticipate discharge back to previous environment  Consults called:  Dr. Sharlet Salina of Ortho and Dr. Tasia Catchings of oncology is consulted  Admission status and Level of care: Med-Surg:     as inpt       Dispo: Paul Briggs is from: Home              Anticipated d/c is to: Home              Anticipated d/c date is: 2 days              Briggs currently is not medically stable to d/c.    Severity of Illness:  Paul appropriate Briggs status for this Briggs is INPATIENT.  Inpatient status is judged to be reasonable and necessary in order to provide Paul required intensity of service to ensure Paul Briggs's safety. Paul Briggs's presenting symptoms, physical exam findings, and initial radiographic and laboratory data in Paul context of their chronic comorbidities is felt to place them at high risk for further clinical deterioration. Furthermore, it is not anticipated that Paul Briggs  will be medically stable for discharge from Paul hospital within 2 midnights of admission.   * I certify that at Paul point of admission it is my clinical judgment that Paul Briggs will require inpatient hospital care spanning beyond 2 midnights from Paul point of admission due to high intensity of service, high risk for further deterioration and high frequency of surveillance required.*       Date of Service 12/25/2021    Ivor Costa [facility]   If 7PM-7AM, please contact night-coverage [email or web address] 12/25/2021, 6:11 PM

## 2021-12-25 NOTE — ED Notes (Signed)
Informed RN bed assigned 

## 2021-12-25 NOTE — Consult Note (Signed)
Pharmacy Antibiotic Note  Paul Briggs. is a 76 y.o. male admitted on 12/25/2021 with septic joint infection.  Pharmacy has been consulted for vancomycin and cefepime dosing.  Plan: Cefepime 2g IV every 8 hours Vancomycin '2000mg'$  IV x1 in ED. Give vancomycin '1250mg'$  IV every 24 hours  Goal AUC 400-550  Est AUC: 489.4 Est Cmax: 34.6  Est Cmin: 11.6 Calculated with SCr 1.35 mg/dL  Vd coefficient: 0.5 L/kg  Weight used: IBW   Height: '6\' 1"'$  (185.4 cm) Weight: 104.8 kg (231 lb 0.7 oz) IBW/kg (Calculated) : 79.9  Temp (24hrs), Avg:99.5 F (37.5 C), Min:99.5 F (37.5 C), Max:99.5 F (37.5 C)  Recent Labs  Lab 12/25/21 1250 12/25/21 1438  WBC 30.4*  --   CREATININE 1.35*  --   LATICACIDVEN  --  1.1    Estimated Creatinine Clearance: 60.1 mL/min (A) (by C-G formula based on SCr of 1.35 mg/dL (H)).    No Known Allergies  Antimicrobials this admission: 12/1 vancomycin >>  12/1 cefepime >>    Microbiology results: 1/21 BCx: sent 12/1 body fluid cx: sent  Thank you for allowing pharmacy to be a part of this patient's care.  Darrick Penna 12/25/2021 6:10 PM

## 2021-12-25 NOTE — ED Provider Notes (Signed)
[facility] Provider Note    Event Date/Time   First MD Initiated Contact with Patient 12/25/21 1207     (approximate)   History   right ankle   HPI  Paul Coker [name] is a 76 y.o. male with history of non-insulin-dependent diabetes, hypertension, CKD, presents to the emergency department with right lower extremity pain.  He is complaining of ankle pain and knee pain.  Pain along the calf.  States he has had a lot of swelling in this area.  Painful to bear weight.  Also having wrist pain.  Patient states he does eat a lot of ground meat.  He does not drink alcohol.      Physical Exam   Triage Vital Signs: ED Triage Vitals  Enc Vitals Group     BP 12/25/21 1052 (!) 151/93     Pulse Rate 12/25/21 1052 76     Resp 12/25/21 1052 20     Temp 12/25/21 1052 99.5 F (37.5 C)     Temp src --      SpO2 12/25/21 1052 97 %     Weight 12/25/21 1156 231 lb 0.7 oz (104.8 kg)     Height 12/25/21 1156 '6\' 1"'$  (1.854 m)     Head Circumference --      Peak Flow --      Pain Score 12/25/21 1047 10     Pain Loc --      Pain Edu? --      Excl. in [place]? --     Most recent vital signs: Vitals:   12/25/21 1052  BP: (!) 151/93  Pulse: 76  Resp: 20  Temp: 99.5 F (37.5 C)  SpO2: 97%     General: Awake, no distress.   CV:  Good peripheral perfusion. regular rate and  rhythm Resp:  Normal effort. Lungs cta Abd:  No distention.   Other:  Right foot is swollen warm and tender to palpation, right ankle is tender, right calf is tender, right knee is tender, neurovascular is intact   ED Results / Procedures / Treatments   Labs (all labs ordered are listed, but only abnormal results are displayed) Labs Reviewed  BASIC METABOLIC PANEL - Abnormal; Notable for the following components:      Result Value   Glucose, Bld 188 (*)    Creatinine, Ser 1.35 (*)    Calcium 8.6 (*)    GFR, Estimated 55 (*)    All other components within normal limits  CBC WITH  DIFFERENTIAL/PLATELET - Abnormal; Notable for the following components:   WBC 30.4 (*)    RBC 3.43 (*)    Hemoglobin 9.6 (*)    HCT 30.5 (*)    RDW 17.3 (*)    Neutro Abs 8.8 (*)    Lymphs Abs 18.7 (*)    Monocytes Absolute 2.6 (*)    Abs Immature Granulocytes 0.10 (*)    All other components within normal limits  CULTURE, BLOOD (ROUTINE X 2)  CULTURE, BLOOD (ROUTINE X 2)  BODY FLUID CULTURE W GRAM STAIN  URIC ACID  PATHOLOGIST SMEAR REVIEW  LACTIC ACID, PLASMA  LACTIC ACID, PLASMA  GLUCOSE, BODY FLUID OTHER            PROTEIN, BODY FLUID (OTHER)  SYNOVIAL CELL COUNT + DIFF, W/ CRYSTALS     EKG     RADIOLOGY X-ray of the right ankle and left wrist Ultrasound right lower extremity for DVT    PROCEDURES:  Procedures   MEDICATIONS ORDERED IN ED: Medications  ceFEPIme (MAXIPIME) 2 g in sodium chloride 0.9 % 100 mL IVPB (has no administration in time range)  metroNIDAZOLE (FLAGYL) IVPB 500 mg (has no administration in time range)  vancomycin (VANCOCIN) IVPB 1000 mg/200 mL premix (has no administration in time range)  oxyCODONE-acetaminophen (PERCOCET/ROXICET) 5-325 MG per tablet 1 tablet (has no administration in time range)  acetaminophen (TYLENOL) tablet 650 mg (has no administration in time range)  hydrALAZINE (APRESOLINE) injection 5 mg (has no administration in time range)  oxyCODONE-acetaminophen (PERCOCET/ROXICET) 5-325 MG per tablet 1 tablet (1 tablet Oral Given 12/25/21 1255)  morphine (PF) 4 MG/ML injection 4 mg (4 mg Intravenous Given 12/25/21 1443)  ondansetron (ZOFRAN) injection 4 mg (4 mg Intravenous Given 12/25/21 1442)     IMPRESSION / MDM / ASSESSMENT AND PLAN / ED COURSE  I reviewed the triage vital signs and the nursing notes.                              Differential diagnosis includes, but is not limited to, arthritis, cellulitis, DVT, gout  Patient's presentation is most consistent with acute complicated illness / injury requiring  diagnostic workup.   X-ray of the right ankle and left wrist independently reviewed and interpreted by me as having arthritis.  Physical exam is concerning for DVT due to the right lower extremity tenderness, will order ultrasound venous right lower extremity  We will draw basic labs and uric acid to assess for gout  Metabolic panel uric acid reassuring CBC is very concerning for infection versus CLL, WBC of 30, smudge cells noted, pathology is doing a smear  Clinical Course as of 12/25/21 1518  Fri Dec 25, 2021  1517 Path Review: Blood smear is reviewed. [SF]    Clinical Course User Index [SF] Versie Starks, PA-C  Dr Corky Downs in to see the patient.  We will assess patient for septic joint as he has difficulty moving the knee.  We feel that CLL is less likely and patient most likely has a septic joint infection in the leg.  Labs added for lactic acid, blood cultures, arthrocentesis panel See procedure note by Dr. Corky Downs for joint aspiration  Dr. Corky Downs spoke with Dr.Niu for admission due to the possible septic joint, infections, cellulitis  Pathologist read the blood smear as concerning for CLL, will send blood for cytology  FINAL CLINICAL IMPRESSION(S) / ED DIAGNOSES   Final diagnoses:  Cellulitis of right lower extremity  Septic infrapatellar bursitis of right knee     Rx / DC Orders   ED Discharge Orders     None        Note:  This document was prepared using Dragon voice recognition software and may include unintentional dictation errors.    Versie Starks, PA-C 12/25/21 1519    Lavonia Drafts, MD 12/29/21 [phone]

## 2021-12-26 DIAGNOSIS — M25561 Pain in right knee: Secondary | ICD-10-CM | POA: Diagnosis not present

## 2021-12-26 LAB — URINALYSIS, COMPLETE (UACMP) WITH MICROSCOPIC
Bacteria, UA: NONE SEEN
Bilirubin Urine: NEGATIVE
Glucose, UA: >=500 mg/dL — AB
Hgb urine dipstick: NEGATIVE
Ketones, ur: 5 mg/dL — AB
Nitrite: NEGATIVE
Protein, ur: 100 mg/dL — AB
Specific Gravity, Urine: 1.031 — ABNORMAL HIGH (ref 1.005–1.030)
pH: 5 (ref 5.0–8.0)

## 2021-12-26 LAB — BASIC METABOLIC PANEL
Anion gap: 5 (ref 5–15)
BUN: 19 mg/dL (ref 8–23)
CO2: 26 mmol/L (ref 22–32)
Calcium: 8.3 mg/dL — ABNORMAL LOW (ref 8.9–10.3)
Chloride: 108 mmol/L (ref 98–111)
Creatinine, Ser: 1.22 mg/dL (ref 0.61–1.24)
GFR, Estimated: >60 mL/min (ref >60)
Glucose, Bld: 299 mg/dL — ABNORMAL HIGH (ref 70–99)
Potassium: 4.1 mmol/L (ref 3.5–5.1)
Sodium: 139 mmol/L (ref 135–145)

## 2021-12-26 LAB — CBC
HCT: 31.4 % — ABNORMAL LOW (ref 39.0–52.0)
Hemoglobin: 10.1 g/dL — ABNORMAL LOW (ref 13.0–17.0)
MCH: 28.1 pg (ref 26.0–34.0)
MCHC: 32.2 g/dL (ref 30.0–36.0)
MCV: 87.2 fL (ref 80.0–100.0)
Platelets: 229 10*3/uL (ref 150–400)
RBC: 3.6 MIL/uL — ABNORMAL LOW (ref 4.22–5.81)
RDW: 17.3 % — ABNORMAL HIGH (ref 11.5–15.5)
WBC: 29.7 10*3/uL — ABNORMAL HIGH (ref 4.0–10.5)
nRBC: 0 % (ref 0.0–0.2)

## 2021-12-26 LAB — RETIC PANEL
Immature Retic Fract: 14.3 % (ref 2.3–15.9)
RBC.: 3.58 MIL/uL — ABNORMAL LOW (ref 4.22–5.81)
Retic Count, Absolute: 22.2 10*3/uL (ref 19.0–186.0)
Retic Ct Pct: 0.6 % (ref 0.4–3.1)
Reticulocyte Hemoglobin: 30 pg (ref >27.9)

## 2021-12-26 LAB — GLUCOSE, CAPILLARY
Glucose-Capillary: 230 mg/dL — ABNORMAL HIGH (ref 70–99)
Glucose-Capillary: 219 mg/dL — ABNORMAL HIGH (ref 70–99)
Glucose-Capillary: 194 mg/dL — ABNORMAL HIGH (ref 70–99)
Glucose-Capillary: 171 mg/dL — ABNORMAL HIGH (ref 70–99)
Glucose-Capillary: 131 mg/dL — ABNORMAL HIGH (ref 70–99)

## 2021-12-26 LAB — HIV ANTIBODY (ROUTINE TESTING W REFLEX): HIV Screen 4th Generation wRfx: NONREACTIVE

## 2021-12-26 LAB — LACTATE DEHYDROGENASE: LDH: 182 U/L (ref 98–192)

## 2021-12-26 LAB — HEPATITIS PANEL, ACUTE
HCV Ab: NONREACTIVE
Hep A IgM: NONREACTIVE
Hep B C IgM: NONREACTIVE
Hepatitis B Surface Ag: NONREACTIVE

## 2021-12-26 LAB — C-REACTIVE PROTEIN: CRP: 22.2 mg/dL — ABNORMAL HIGH (ref <1.0)

## 2021-12-26 MED ORDER — CEPHALEXIN 500 MG PO CAPS: 500.0000 mg | ORAL_CAPSULE | Freq: Three times a day (TID) | ORAL | 0 refills | Status: AC

## 2021-12-26 MED ORDER — VANCOMYCIN HCL 1750 MG/350ML IV SOLN
1750.0000 mg | INTRAVENOUS | Status: DC
  Administered 2021-12-26: 1750 mg via INTRAVENOUS
  Filled 2021-12-26: qty 350

## 2021-12-26 NOTE — Care Management CC44 (Addendum)
Condition Code 44 Documentation Completed  Patient Details  Name: Paul Crisp [name] MRN: [id number] Date of Birth: 1946/01/25   Condition Code 44 given:  Yes Patient signature on Condition Code 44 notice:  Yes Documentation of 2 MD's agreement:  Yes Code 44 added to claim:  Yes    Izola Price, RN 12/26/2021, 3:19 PM  Simmie Davies RN CM

## 2021-12-26 NOTE — Progress Notes (Signed)
Spoke with patient's daughter. Reported no changes.

## 2021-12-26 NOTE — Consult Note (Signed)
Pharmacy Antibiotic Note  Paul Briggs. is a 76 y.o. male admitted on 12/25/2021 with septic joint infection.  Pharmacy has been consulted for vancomycin and cefepime dosing.  Plan: Cefepime 2g IV every 8 hours  Vancomycin '1750mg'$  IV every 24 hours  Goal AUC 400-550  Est AUC: 433.7 Est Cmax: 32.1  Est Cmin: 9.9  Calculated with SCr 1.22 mg/dL  Vd coefficient: 0.5 L/kg  Weight used: IBW   Height: '6\' 1"'$  (185.4 cm) Weight: 104.8 kg (231 lb 0.7 oz) IBW/kg (Calculated) : 79.9  Temp (24hrs), Avg:98.6 F (37 C), Min:97.9 F (36.6 C), Max:98.9 F (37.2 C)  Recent Labs  Lab 12/25/21 1250 12/25/21 1438 12/25/21 2021 12/26/21 0455  WBC 30.4*  --   --  29.7*  CREATININE 1.35*  --   --  1.22  LATICACIDVEN  --  1.1 2.1*  --      Estimated Creatinine Clearance: 66.5 mL/min (by C-G formula based on SCr of 1.22 mg/dL).    No Known Allergies  Antimicrobials this admission: 12/1 vancomycin >>  12/1 cefepime >>    Microbiology results: 1/21 BCx: NGTD 12/1 body fluid cx: sent  Thank you for allowing pharmacy to be a part of this patient's care.  Kiah Keay A Dicy Smigel 12/26/2021 2:12 PM

## 2021-12-26 NOTE — Progress Notes (Signed)
Subjective:  Patient reports that pain in the right knee and ankle have notably improved from yesterday and he is able to range the knee and ankle with less discomfort.  Objective:   VITALS:   Vitals:   12/25/21 1837 12/25/21 1924 12/25/21 2331 12/26/21 0745  BP: (!) 153/78 (!) 153/78 (!) 142/71 (!) 167/77  Pulse: 63 63 88 64  Resp:  '20 18 17  '$ Temp: 98.8 F (37.1 C) 98.8 F (37.1 C) 98.9 F (37.2 C) 97.9 F (36.6 C)  TempSrc:  Oral    SpO2: 100%  94% 99%  Weight:      Height:  '6\' 1"'$  (1.854 m)      PHYSICAL EXAM:  General: Alert, comfortable  Left wrist: No significant tenderness palpation able to flex and extend appropriately  Right knee: Able to actively flex and extend from 0 to 90 degrees without significant discomfort  Right ankle: improved swelling mild tenderness to palpation, able to actively plantarflex and dorsiflex improved from yesterday  LABS  Results for orders placed or performed during the hospital encounter of 12/25/21 (from the past 24 hour(s))  Basic metabolic panel     Status: Abnormal   Collection Time: 12/25/21 12:50 PM  Result Value Ref Range   Sodium 140 135 - 145 mmol/L   Potassium 3.6 3.5 - 5.1 mmol/L   Chloride 105 98 - 111 mmol/L   CO2 27 22 - 32 mmol/L   Glucose, Bld 188 (H) 70 - 99 mg/dL   BUN 18 8 - 23 mg/dL   Creatinine, Ser 1.35 (H) 0.61 - 1.24 mg/dL   Calcium 8.6 (L) 8.9 - 10.3 mg/dL   GFR, Estimated 55 (L) >60 mL/min   Anion gap 8 5 - 15  CBC with Differential     Status: Abnormal   Collection Time: 12/25/21 12:50 PM  Result Value Ref Range   WBC 30.4 (H) 4.0 - 10.5 K/uL   RBC 3.43 (L) 4.22 - 5.81 MIL/uL   Hemoglobin 9.6 (L) 13.0 - 17.0 g/dL   HCT 30.5 (L) 39.0 - 52.0 %   MCV 88.9 80.0 - 100.0 fL   MCH 28.0 26.0 - 34.0 pg   MCHC 31.5 30.0 - 36.0 g/dL   RDW 17.3 (H) 11.5 - 15.5 %   Platelets 195 150 - 400 K/uL   nRBC 0.0 0.0 - 0.2 %   Neutrophils Relative % 29 %   Neutro Abs 8.8 (H) 1.7 - 7.7 K/uL   Lymphocytes  Relative 62 %   Lymphs Abs 18.7 (H) 0.7 - 4.0 K/uL   Monocytes Relative 9 %   Monocytes Absolute 2.6 (H) 0.1 - 1.0 K/uL   Eosinophils Relative 0 %   Eosinophils Absolute 0.1 0.0 - 0.5 K/uL   Basophils Relative 0 %   Basophils Absolute 0.1 0.0 - 0.1 K/uL   RBC Morphology MORPHOLOGY UNREMARKABLE    Smear Review Normal platelet morphology    Immature Granulocytes 0 %   Abs Immature Granulocytes 0.10 (H) 0.00 - 0.07 K/uL   Reactive, Benign Lymphocytes PRESENT    Smudge Cells PRESENT   Uric acid     Status: None   Collection Time: 12/25/21 12:50 PM  Result Value Ref Range   Uric Acid, Serum 5.5 3.7 - 8.6 mg/dL  Pathologist smear review     Status: None   Collection Time: 12/25/21 12:50 PM  Result Value Ref Range   Path Review Blood smear is reviewed.   Sedimentation rate  Status: None   Collection Time: 12/25/21  1:47 PM  Result Value Ref Range   Sed Rate 3 0 - 20 mm/hr  Body fluid culture w Gram Stain     Status: None (Preliminary result)   Collection Time: 12/25/21  2:27 PM   Specimen: Synovium; Body Fluid  Result Value Ref Range   Specimen Description      SYNOVIAL Performed at [facility], [address] [place], [place] [address]    Special Requests      SYNOVIAL Performed at [facility], [place], [place] [address]    Gram Stain NO WBC SEEN NO ORGANISMS SEEN     Culture      NO GROWTH < 24 HOURS Performed at [facility], [place] [address] [place], [place] [address]    Report Status PENDING   Lactic acid, plasma     Status: None   Collection Time: 12/25/21  2:38 PM  Result Value Ref Range   Lactic Acid, Venous 1.1 0.5 - 1.9 mmol/L  Blood culture (routine x 2)     Status: None (Preliminary result)   Collection Time: 12/25/21  2:38 PM   Specimen: Right Antecubital; Blood  Result Value Ref Range   Specimen Description RIGHT ANTECUBITAL    Special Requests      BOTTLES DRAWN AEROBIC AND ANAEROBIC Blood Culture results  may not be optimal due to an excessive volume of blood received in culture bottles   Culture      NO GROWTH < 24 HOURS Performed at [facility], [address], [place], [place] [address]    Report Status PENDING   Blood culture (routine x 2)     Status: None (Preliminary result)   Collection Time: 12/25/21  2:52 PM   Specimen: BLOOD LEFT FOREARM  Result Value Ref Range   Specimen Description BLOOD LEFT FOREARM    Special Requests      BOTTLES DRAWN AEROBIC AND ANAEROBIC Blood Culture results may not be optimal due to an excessive volume of blood received in culture bottles   Culture      NO GROWTH < 24 HOURS Performed at [facility], [address] [place], [place] [address]    Report Status PENDING   Synovial cell count + diff, w/ crystals     Status: Abnormal   Collection Time: 12/25/21  2:52 PM  Result Value Ref Range   Color, Synovial YELLOW YELLOW   Appearance-Synovial HAZY (A) CLEAR   Crystals, Fluid NO CRYSTALS SEEN    WBC, Synovial 296 (H) 0 - 200 /cu mm   Neutrophil, Synovial 72 %   Lymphocytes-Synovial Fld 15 %   Monocyte-Macrophage-Synovial Fluid 13 %   Eosinophils-Synovial 0 %  Glucose, capillary     Status: Abnormal   Collection Time: 12/25/21  6:19 PM  Result Value Ref Range   Glucose-Capillary 106 (H) 70 - 99 mg/dL  Lactic acid, plasma     Status: Abnormal   Collection Time: 12/25/21  8:21 PM  Result Value Ref Range   Lactic Acid, Venous 2.1 (HH) 0.5 - 1.9 mmol/L  C-reactive protein     Status: Abnormal   Collection Time: 12/25/21  8:21 PM  Result Value Ref Range   CRP 22.2 (H) <1.0 mg/dL  Protime-INR     Status: None   Collection Time: 12/25/21  8:21 PM  Result Value Ref Range   Prothrombin Time 14.9 11.4 - 15.2 seconds   INR  1.2 0.8 - 1.2  APTT     Status: Abnormal   Collection Time: 12/25/21  8:21 PM  Result Value Ref Range   aPTT 38 (H) 24 - 36 seconds  Glucose, capillary     Status: Abnormal   Collection Time: 12/25/21   9:57 PM  Result Value Ref Range   Glucose-Capillary 308 (H) 70 - 99 mg/dL  Basic metabolic panel     Status: Abnormal   Collection Time: 12/26/21  4:55 AM  Result Value Ref Range   Sodium 139 135 - 145 mmol/L   Potassium 4.1 3.5 - 5.1 mmol/L   Chloride 108 98 - 111 mmol/L   CO2 26 22 - 32 mmol/L   Glucose, Bld 299 (H) 70 - 99 mg/dL   BUN 19 8 - 23 mg/dL   Creatinine, Ser 1.22 0.61 - 1.24 mg/dL   Calcium 8.3 (L) 8.9 - 10.3 mg/dL   GFR, Estimated >60 >60 mL/min   Anion gap 5 5 - 15  CBC     Status: Abnormal   Collection Time: 12/26/21  4:55 AM  Result Value Ref Range   WBC 29.7 (H) 4.0 - 10.5 K/uL   RBC 3.60 (L) 4.22 - 5.81 MIL/uL   Hemoglobin 10.1 (L) 13.0 - 17.0 g/dL   HCT 31.4 (L) 39.0 - 52.0 %   MCV 87.2 80.0 - 100.0 fL   MCH 28.1 26.0 - 34.0 pg   MCHC 32.2 30.0 - 36.0 g/dL   RDW 17.3 (H) 11.5 - 15.5 %   Platelets 229 150 - 400 K/uL   nRBC 0.0 0.0 - 0.2 %  Lactate dehydrogenase     Status: None   Collection Time: 12/26/21  4:55 AM  Result Value Ref Range   LDH 182 98 - 192 U/L  Hepatitis panel, acute     Status: None (Preliminary result)   Collection Time: 12/26/21  4:55 AM  Result Value Ref Range   Hepatitis B Surface Ag NON REACTIVE NON REACTIVE   HCV Ab PENDING NON REACTIVE   Hep A IgM PENDING NON REACTIVE   Hep B C IgM PENDING NON REACTIVE  Retic Panel     Status: Abnormal   Collection Time: 12/26/21  4:55 AM  Result Value Ref Range   Retic Ct Pct 0.6 0.4 - 3.1 %   RBC. 3.58 (L) 4.22 - 5.81 MIL/uL   Retic Count, Absolute 22.2 19.0 - 186.0 K/uL   Immature Retic Fract 14.3 2.3 - 15.9 %   Reticulocyte Hemoglobin 30.0 >27.9 pg  Glucose, capillary     Status: Abnormal   Collection Time: 12/26/21  9:38 AM  Result Value Ref Range   Glucose-Capillary 230 (H) 70 - 99 mg/dL    US Venous Img Lower Unilateral Right  Result Date: 12/25/2021 CLINICAL DATA:  Right lower extremity swelling and pain for 5 days. EXAM: RIGHT LOWER EXTREMITY VENOUS DOPPLER ULTRASOUND  TECHNIQUE: Gray-scale sonography with compression, as well as color and duplex ultrasound, were performed to evaluate the deep venous system(s) from the level of the common femoral vein through the popliteal and proximal calf veins. COMPARISON:  None Available. FINDINGS: VENOUS Normal compressibility of the common femoral, superficial femoral, and popliteal veins, as well as the visualized calf veins. Visualized portions of profunda femoral vein and great saphenous vein unremarkable. No filling defects to suggest DVT on grayscale or color Doppler imaging. Doppler waveforms show normal direction of venous flow, normal respiratory plasticity and response to augmentation. Limited views of the  contralateral common femoral vein are unremarkable. OTHER Normal morphology right groin lymph nodes are identified, largest measures 2 cm. Limitations: none IMPRESSION: Negative. Electronically Signed   By: Abelardo Diesel M.D.   On: 12/25/2021 16:19   DG Wrist Complete Left  Result Date: 12/25/2021 CLINICAL DATA:  Left wrist pain and swelling.  No known trauma. EXAM: LEFT WRIST - COMPLETE 3+ VIEW COMPARISON:  None Available. FINDINGS: There is 4 mm ulnar negative variance with mild distal radioulnar joint space narrowing and peripheral osteophytosis. Mild chondrocalcinosis within the distal aspect of the triangular fibrocartilage complex. Mild triscaphe and thumb carpometacarpal joint space narrowing and peripheral osteophytosis. No acute fracture or dislocation. IMPRESSION: 1. 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 2. Mild triscaphe and thumb carpometacarpal osteoarthritis. 3. Mild chondrocalcinosis within the distal aspect of the triangular fibrocartilage complex. Electronically Signed   By: Yvonne Kendall M.D.   On: 12/25/2021 11:43   DG Ankle Complete Right  Result Date: 12/25/2021 CLINICAL DATA:  Right ankle pain and swelling. EXAM: RIGHT ANKLE - COMPLETE 3+ VIEW COMPARISON:  Right foot  radiographs 12/23/2008, right tibia and fibula radiographs 12/23/2008 FINDINGS: The ankle mortise is symmetric and intact. Mild-to-moderate distal medial malleolar degenerative spurring. Moderate plantar and posterior calcaneal heel spurs, slightly increased from remote 2010 radiographs. Moderate dorsal talonavicular, navicular-cuneiform, and tarsometatarsal degenerative osteophytes on lateral view. No acute fracture or dislocation. Mild-to-moderate medial and mild lateral ankle soft tissue swelling. IMPRESSION: 1. Mild-to-moderate medial and mild lateral ankle soft tissue swelling. No acute fracture. 2. Moderate plantar and posterior calcaneal heel spurs. Electronically Signed   By: Yvonne Kendall M.D.   On: 12/25/2021 11:42    Assessment/Plan:    76 year old male admitted with multiple joint arthralgias concern for possible septic arthritis with right knee cultures no growth to date and symptomatic improvement in left wrist, right knee and right ankle pain over the last 18 hours   -Continue to follow right knee cultures -Continue oral medical management per hospitalist team -Please call if patient's symptoms worsen or cultures turn positive   Renee Harder , MD 12/26/2021, 11:27 AM

## 2021-12-26 NOTE — Evaluation (Signed)
Physical Therapy Evaluation Patient Details Name: Paul Serpe [name] MRN: [id number] DOB: September 25, 1945 Today's Date: 12/26/2021  History of Present Illness  Pt is a 76 y.o. male presenting to hospital 12/25/21 with c/o R LE pain (ankle and knee); also L wrist pain.  Pt admitted with R knee pain, L wrist pain, and R ankle pain; leukocytosis, and essential htn.  S/p arthrocentesis.  PMH includes NIDDM, htn, CKD, SDH, OSA, chronic pain.  Clinical Impression  Prior to hospital admission, pt was independent with functional mobility; lives with his wife in 1 level home with level entry.  Currently pt is CGA to min assist with bed mobility; CGA with transfers (extra effort and momentum required to stand up to RW); and min assist to side step to L along bed (towards head of bed) with RW use.  Limited activity d/t significant increase in R LE pain with activity.  Pt reporting a little discomfort in R knee and L wrist but 7/10 R ankle pain at rest beginning of session; pt reporting 10/10 R knee pain and a little discomfort in R ankle and L wrist end of session at rest (nurse notified regarding pt's request for pain meds).  Pt would benefit from skilled PT to address noted impairments and functional limitations (see below for any additional details).  Upon hospital discharge, pt would benefit from SNF (MD, TOC, and pt's nurse notified of pt's status and SNF recommendation).    Recommendations for follow up therapy are one component of a multi-disciplinary discharge planning process, led by the attending physician.  Recommendations may be updated based on patient status, additional functional criteria and insurance authorization.  Follow Up Recommendations Skilled nursing-short term rehab (<3 hours/day) Can patient physically be transported by private vehicle: No    Assistance Recommended at Discharge Frequent or constant Supervision/Assistance  Patient can return home with the following  A lot of help with  walking and/or transfers;A lot of help with bathing/dressing/bathroom;Assistance with cooking/housework;Assist for transportation;Help with stairs or ramp for entrance    Equipment Recommendations Rolling walker (2 wheels);BSC/3in1;Wheelchair (measurements PT);Wheelchair cushion (measurements PT)  Recommendations for Other Services  OT consult    Functional Status Assessment Patient has had a recent decline in their functional status and demonstrates the ability to make significant improvements in function in a reasonable and predictable amount of time.     Precautions / Restrictions Precautions Precautions: Fall Restrictions Weight Bearing Restrictions: No      Mobility  Bed Mobility Overal bed mobility: Needs Assistance Bed Mobility: Supine to Sit, Sit to Supine     Supine to sit: Min guard, HOB elevated Sit to supine: Min assist, HOB elevated (assist for R LE)   General bed mobility comments: vc's for technique; increased time to perform d/t R LE pain    Transfers Overall transfer level: Needs assistance Equipment used: Rolling walker (2 wheels) Transfers: Sit to/from Stand Sit to Stand: Min guard           General transfer comment: unable to stand first trial from bed but with extra effort and momentum pt able to stand 2nd trial from bed up to RW; assist to control descent sitting d/t R knee pain; vc's for UE/LE placement    Ambulation/Gait Ambulation/Gait assistance: Min assist Gait Distance (Feet):  (pt sidestepped to L along bed x3 feet (towards head of bed)) Assistive device: Rolling walker (2 wheels)   Gait velocity: decreased     General Gait Details: antalgic; minimal weight through R LE (  increased weight through UE's on walker to offweight R LE to advance L LE); increased effort/time to take steps  Stairs            Wheelchair Mobility    Modified Rankin (Stroke Patients Only)       Balance Overall balance assessment: Needs  assistance Sitting-balance support: No upper extremity supported, Feet supported Sitting balance-Leahy Scale: Good Sitting balance - Comments: steady sitting reaching within BOS   Standing balance support: Bilateral upper extremity supported, Reliant on assistive device for balance Standing balance-Leahy Scale: Fair Standing balance comment: steady static standing with B UE support on RW                             Pertinent Vitals/Pain Pain Assessment Pain Assessment: 0-10 Pain Score: 10-Worst pain ever Pain Location: R knee (anterior medial) Pain Descriptors / Indicators: Sharp, Shooting, Tender, Grimacing, Guarding, Aching Pain Intervention(s): Limited activity within patient's tolerance, Monitored during session, Repositioned, Patient requesting pain meds-RN notified Vitals (HR and O2 on room air) stable and WFL throughout treatment session.    Home Living Family/patient expects to be discharged to:: Private residence Living Arrangements: Spouse/significant other Available Help at Discharge: Family Type of Home: House Home Access: Level entry       Home Layout: One [place]: None      Prior Function Prior Level of Function : Independent/Modified Independent             Mobility Comments: No recent falls.       Hand Dominance        Extremity/Trunk Assessment   Upper Extremity Assessment Upper Extremity Assessment: LUE deficits/detail (at least 3/5 AROM B shoulder flexion (limited d/t chronic B shoulder pain)) LUE Deficits / Details: decreased L wrist AROM flexion/extension d/t wrist pain LUE: Unable to fully assess due to pain    Lower Extremity Assessment Lower Extremity Assessment: RLE deficits/detail;LLE deficits/detail RLE Deficits / Details: at least 3/5 hip flexion; fair R quad set; R knee to neutral and at least 90 degrees flexion but minimal AROM tolerated d/t R knee pain; at least 2+/5 R ankle DF/PF AROM (limited d/t R  ankle pain) RLE: Unable to fully assess due to pain LLE Deficits / Details: L LE AROM WFL    Cervical / Trunk Assessment Cervical / Trunk Assessment: Normal  Communication   Communication: No difficulties  Cognition Arousal/Alertness: Awake/alert Behavior During Therapy: WFL for tasks assessed/performed Overall Cognitive Status: Within Functional Limits for tasks assessed                                          General Comments General comments (skin integrity, edema, etc.): R knee and R ankle swelling noted.  Nursing cleared pt for participation in physical therapy.  Pt agreeable to PT session.    Exercises  Transfer and gait training with RW use.   Assessment/Plan    PT Assessment Patient needs continued PT services  PT Problem List Decreased strength;Decreased range of motion;Decreased activity tolerance;Decreased balance;Decreased mobility;Decreased knowledge of use of DME;Decreased knowledge of precautions;Pain       PT Treatment Interventions DME instruction;Gait training;Functional mobility training;Therapeutic activities;Therapeutic exercise;Balance training;Patient/family education    PT Goals (Current goals can be found in the Care Plan section)  Acute Rehab PT Goals Patient Stated Goal: to improve pain and mobility  PT Goal Formulation: With patient Time For Goal Achievement: 01/09/22 Potential to Achieve Goals: Fair    Frequency Min 2X/week     Co-evaluation               AM-PAC PT "6 Clicks" Mobility  Outcome Measure Help needed turning from your back to your side while in a flat bed without using bedrails?: None Help needed moving from lying on your back to sitting on the side of a flat bed without using bedrails?: A Little Help needed moving to and from a bed to a chair (including a wheelchair)?: A Little Help needed standing up from a chair using your arms (e.g., wheelchair or bedside chair)?: A Little Help needed to walk in  hospital room?: A Lot Help needed climbing 3-5 steps with a railing? : Total 6 Click Score: 16    End of Session Equipment Utilized During Treatment: Gait belt Activity Tolerance: Patient limited by pain Patient left: in bed;with call bell/phone within reach;with bed alarm set Nurse Communication: Mobility status;Precautions;Patient requests pain meds PT Visit Diagnosis: Other abnormalities of gait and mobility (R26.89);Muscle weakness (generalized) (M62.81);Pain Pain - Right/Left: Right Pain - part of body: Knee;Ankle and joints of foot    Time: 1791-5056 PT Time Calculation (min) (ACUTE ONLY): 19 min   Charges:   PT Evaluation $PT Eval Low Complexity: 1 Low PT Treatments $Therapeutic Activity: 8-22 mins       Leitha Bleak, PT 12/26/21, 5:22 PM

## 2021-12-26 NOTE — Hospital Course (Addendum)
Taken from H&P.  Paul Ramus [name] is a 76 y.o. male with medical history significant of hypertension, hyperlipidemia, diabetes mellitus, hypothyroidism, testicular mass, CKD-3, BPH, prostatitis, orchitis, epididymitis, SDH, OSA, chronic pain, obesity obesity BMI 30.48, who presents with pain in right ankle and right knee and left wrist for 5 days.  No fever or chills.  No injuries noted.  Some decrease in appetite and fatigue but no other significant change.  Data reviewed independently and ED Course: pt was found to have WBC 30.4, with neutrophil 29% and lymphocyte 62%.  Peripheral smear showed leukocytosis with slight left shift in myeloid series and lymphocytosis with abnormal morphology.  Uric acid 5.5, worsening renal function with creatinine 1.35, BUN 18, GFR 55 (baseline creatinine 1.08 on 07/06/2021), lactic acid 1.1.  Temperature 99.5, blood pressure 151/93, heart rate 76, RR 20, oxygen saturation 97% on room air.  Imaging with soft tissue swelling around ankle and wrist. Venous Doppler studies were negative for DVT.  Orthopedic surgery was consulted s/p arthrocentesis, preliminary cell count 296 with 72% neutrophil, preliminary cultures with no organism.  Adding crystal studies.  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  They ordered flow cytometry, hepatitis and HIV testing-still pending.  Recommends outpatient follow-up.  12/2: Blood pressure mildly elevated at 167/77.  Renal function improving and appears to be at baseline now.  Leukocytosis at 29.7.  CRP markedly elevated at 22.2 with normal ESR. Preliminary blood and synovial fluid cultures negative.  No crystals seen.  Uric acid was within normal limit. Patient received cefepime and vancomycin while in the hospital. Most likely not infective, as preliminary cultures remain negative.  Patient is being discharged on Keflex for 5 days and need to have a close follow-up with orthopedic surgery and  oncology for further management.  Patient also need to follow-up with oncology for abnormal labs and smear.  12/3: Patient was discharged yesterday on Keflex, due to excessive pain he was unable to participate with PT and they are initial recommendations for SNF.  Patient improved significantly today and PT recommendations changed to home health.  Patient would like to go home today and will follow-up with orthopedic surgery as an outpatient.  He will also follow-up with oncology.

## 2021-12-26 NOTE — Progress Notes (Signed)
Pt refused CPAP. Pt states he has one at home that he does not wear and does not wish to wear one here. Pt aware if he changes his mind one will be made available for him.

## 2021-12-26 NOTE — Progress Notes (Signed)
Rounded on patient and he was sleeping. Awakened to take po med. Denied additional needs.

## 2021-12-26 NOTE — Discharge Summary (Signed)
Physician Discharge Summary   Patient: Paul Briggs. MRN: [id number] DOB: 1945/02/12  Admit date:     12/25/2021  Discharge date: 12/26/21  Discharge Physician: Lorella Nimrod   PCP: [facility]   Recommendations at discharge:  Please obtain CBC and BMP in 1 week Follow-up final culture results Follow-up with orthopedic surgery Follow-up with oncology  Discharge Diagnoses: Principal Problem:   Right knee pain Active Problems:   Left wrist pain   Right ankle pain   Leukocytosis   Essential hypertension   Type II diabetes mellitus with renal manifestations ([place])   BPH with obstruction/lower urinary tract symptoms   Acute renal failure superimposed on stage 3a chronic kidney disease ([place])   HLD (hyperlipidemia)   Hypothyroidism   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  Paul Briggs. is a 76 y.o. male with medical history significant of hypertension, hyperlipidemia, diabetes mellitus, hypothyroidism, testicular mass, CKD-3, BPH, prostatitis, orchitis, epididymitis, SDH, OSA, chronic pain, obesity obesity BMI 30.48, who presents with pain in right ankle and right knee and left wrist for 5 days.  No fever or chills.  No injuries noted.  Some decrease in appetite and fatigue but no other significant change.  Data reviewed independently and ED Course: pt was found to have WBC 30.4, with neutrophil 29% and lymphocyte 62%.  Peripheral smear showed leukocytosis with slight left shift in myeloid series and lymphocytosis with abnormal morphology.  Uric acid 5.5, worsening renal function with creatinine 1.35, BUN 18, GFR 55 (baseline creatinine 1.08 on 07/06/2021), lactic acid 1.1.  Temperature 99.5, blood pressure 151/93, heart rate 76, RR 20, oxygen saturation 97% on room air.  Imaging with soft tissue swelling around ankle  and wrist. Venous Doppler studies were negative for DVT.  Orthopedic surgery was consulted s/p arthrocentesis, preliminary cell count 296 with 72% neutrophil, preliminary cultures with no organism.  Adding crystal studies.  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  They ordered flow cytometry, hepatitis and HIV testing-still pending.  Recommends outpatient follow-up.  12/2: Blood pressure mildly elevated at 167/77.  Renal function improving and appears to be at baseline now.  Leukocytosis at 29.7.  CRP markedly elevated at 22.2 with normal ESR. Preliminary blood and synovial fluid cultures negative.  No crystals seen.  Uric acid was within normal limit. Patient received cefepime and vancomycin while in the hospital. Most likely not infective, as preliminary cultures remain negative.  Patient is being discharged on Keflex for 5 days and need to have a close follow-up with orthopedic surgery and oncology for further management.  Patient also need to follow-up with oncology for abnormal labs and smear.   Pain control - [employer] Controlled Substance Reporting System database was reviewed. and patient was instructed, not to drive, operate heavy machinery, perform activities at heights, swimming or participation in water activities or provide baby-sitting services while on Pain, Sleep and Anxiety Medications; until their outpatient Physician has advised to do so again. Also recommended to not to take more than prescribed Pain, Sleep and Anxiety Medications.  Consultants: Orthopedic surgery Procedures performed: Right knee arthrocentesis Disposition: Home Diet recommendation:  Discharge Diet Orders (From admission, onward)     Start     Ordered   12/26/21 0000  Diet - low sodium heart healthy  12/26/21 1444           Cardiac diet DISCHARGE MEDICATION: Allergies as of 12/26/2021   No Known Allergies      Medication List     STOP taking these  medications    BLACK CURRANT SEED OIL PO   LAXATIVE PO   meloxicam 7.5 MG tablet Commonly known as: MOBIC       TAKE these medications    amLODipine 10 MG tablet Commonly known as: NORVASC Take 10 mg by mouth daily.   ARTHRITIS STRENGTH BC POWDER PO Take by mouth.   cephALEXin 500 MG capsule Commonly known as: KEFLEX Take 1 capsule (500 mg total) by mouth 3 (three) times daily for 5 days.   levothyroxine 175 MCG tablet Commonly known as: SYNTHROID Take 1 tablet (175 mcg total) by mouth daily.   metFORMIN 500 MG tablet Commonly known as: GLUCOPHAGE Take 500 mg by mouth 2 (two) times daily with a meal.   oxyCODONE-acetaminophen 10-325 MG tablet Commonly known as: PERCOCET Take 1 tablet by mouth every 8 (eight) hours as needed.   oxyCODONE-acetaminophen 10-325 MG tablet Commonly known as: PERCOCET Take 1 tablet by mouth every 8 (eight) hours as needed. Start taking on: December 31, 2021   oxyCODONE-acetaminophen 10-325 MG tablet Commonly known as: PERCOCET Take 1 tablet by mouth every 8 (eight) hours as needed. Start taking on: January 30, 2022   potassium chloride 10 MEQ tablet Commonly known as: KLOR-CON M Take 10 mEq by mouth 2 (two) times daily.   rosuvastatin 10 MG tablet Commonly known as: CRESTOR Take 1 tablet by mouth once daily   tamsulosin 0.4 MG Caps capsule Commonly known as: FLOMAX Take 1 capsule (0.4 mg total) by mouth daily.   valsartan-hydrochlorothiazide 320-25 MG tablet Commonly known as: DIOVAN-HCT Take 1 tablet by mouth daily.        Follow-up [place], [facility]. Schedule an appointment as soon as possible for a visit in 1 week(s).   Specialty: General Practice Contact information: [place] [address] [phone]         Earlie Server, MD. Schedule an appointment as soon as possible for a visit in 1 week(s).   Specialty: Oncology Contact information: [place] [address] [phone]                Discharge Exam: Paul Briggs Weights   12/25/21 1156  Weight: 104.8 kg   General.     In no acute distress. Pulmonary.  Lungs clear bilaterally, normal respiratory effort. CV.  Regular rate and rhythm, no JVD, rub or murmur. Abdomen.  Soft, nontender, nondistended, BS positive. CNS.  Alert and oriented .  No focal neurologic deficit. Extremities.  No edema, no cyanosis, pulses intact and symmetrical. Psychiatry.  Judgment and insight appears normal.   Condition at discharge: stable  The results of significant diagnostics from this hospitalization (including imaging, microbiology, ancillary and laboratory) are listed below for reference.   Imaging Studies: US Venous Img Lower Unilateral Right  Result Date: 12/25/2021 CLINICAL DATA:  Right lower extremity swelling and pain for 5 days. EXAM: RIGHT LOWER EXTREMITY VENOUS DOPPLER ULTRASOUND TECHNIQUE: Gray-scale sonography with compression, as well as color and duplex ultrasound, were performed to evaluate the deep venous system(s) from the level of the common femoral vein through the popliteal and proximal calf veins. COMPARISON:  None Available. FINDINGS: VENOUS Normal compressibility of the common femoral, superficial femoral, and popliteal  veins, as well as the visualized calf veins. Visualized portions of profunda femoral vein and great saphenous vein unremarkable. No filling defects to suggest DVT on grayscale or color Doppler imaging. Doppler waveforms show normal direction of venous flow, normal respiratory plasticity and response to augmentation. Limited views of the contralateral common femoral vein are unremarkable. OTHER Normal morphology right groin lymph nodes are identified, largest measures 2 cm. Limitations: none IMPRESSION: Negative. Electronically Signed   By: Abelardo Diesel M.D.   On: 12/25/2021 16:19   DG Wrist Complete Left  Result Date: 12/25/2021 CLINICAL DATA:  Left  wrist pain and swelling.  No known trauma. EXAM: LEFT WRIST - COMPLETE 3+ VIEW COMPARISON:  None Available. FINDINGS: There is 4 mm ulnar negative variance with mild distal radioulnar joint space narrowing and peripheral osteophytosis. Mild chondrocalcinosis within the distal aspect of the triangular fibrocartilage complex. Mild triscaphe and thumb carpometacarpal joint space narrowing and peripheral osteophytosis. No acute fracture or dislocation. IMPRESSION: 1. 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 2. Mild triscaphe and thumb carpometacarpal osteoarthritis. 3. Mild chondrocalcinosis within the distal aspect of the triangular fibrocartilage complex. Electronically Signed   By: Yvonne Kendall M.D.   On: 12/25/2021 11:43   DG Ankle Complete Right  Result Date: 12/25/2021 CLINICAL DATA:  Right ankle pain and swelling. EXAM: RIGHT ANKLE - COMPLETE 3+ VIEW COMPARISON:  Right foot radiographs 12/23/2008, right tibia and fibula radiographs 12/23/2008 FINDINGS: The ankle mortise is symmetric and intact. Mild-to-moderate distal medial malleolar degenerative spurring. Moderate plantar and posterior calcaneal heel spurs, slightly increased from remote 2010 radiographs. Moderate dorsal talonavicular, navicular-cuneiform, and tarsometatarsal degenerative osteophytes on lateral view. No acute fracture or dislocation. Mild-to-moderate medial and mild lateral ankle soft tissue swelling. IMPRESSION: 1. Mild-to-moderate medial and mild lateral ankle soft tissue swelling. No acute fracture. 2. Moderate plantar and posterior calcaneal heel spurs. Electronically Signed   By: Yvonne Kendall M.D.   On: 12/25/2021 11:42    Microbiology: Results for orders placed or performed during the hospital encounter of 12/25/21  Body fluid culture w Gram Stain     Status: None (Preliminary result)   Collection Time: 12/25/21  2:27 PM   Specimen: Synovium; Body Fluid  Result Value Ref Range Status   Specimen  Description   Final    SYNOVIAL Performed at [facility] [address] [place], [place] [address]    Special Requests   Final    SYNOVIAL Performed at [facility], [place], [place] [address]    Gram Stain NO WBC SEEN NO ORGANISMS SEEN   Final   Culture   Final    NO GROWTH < 24 HOURS Performed at [facility], [place] [address], [place], [place] [address]    Report Status PENDING  Incomplete  Blood culture (routine x 2)     Status: None (Preliminary result)   Collection Time: 12/25/21  2:38 PM   Specimen: Right Antecubital; Blood  Result Value Ref Range Status   Specimen Description RIGHT ANTECUBITAL  Final   Special Requests   Final    BOTTLES DRAWN AEROBIC AND ANAEROBIC Blood Culture results may not be optimal due to an excessive volume of blood received in culture bottles   Culture   Final    NO GROWTH < 24 HOURS Performed at [facility], [address], [place], [place] [address]    Report Status PENDING  Incomplete  Blood culture (routine x 2)  Status: None (Preliminary result)   Collection Time: 12/25/21  2:52 PM   Specimen: BLOOD LEFT FOREARM  Result Value Ref Range Status   Specimen Description BLOOD LEFT FOREARM  Final   Special Requests   Final    BOTTLES DRAWN AEROBIC AND ANAEROBIC Blood Culture results may not be optimal due to an excessive volume of blood received in culture bottles   Culture   Final    NO GROWTH < 24 HOURS Performed at [facility], [address] [place], [place] [address]    Report Status PENDING  Incomplete    Labs: CBC: Recent Labs  Lab 12/25/21 1250 12/26/21 0455  WBC 30.4* 29.7*  NEUTROABS 8.8*  --   HGB 9.6* 10.1*  HCT 30.5* 31.4*  MCV 88.9 87.2  PLT 195 300   Basic Metabolic Panel: Recent Labs  Lab 12/25/21 1250 12/26/21 0455  NA 140 139  K 3.6 4.1  CL 105 108  CO2 27 26  GLUCOSE 188* 299*  BUN 18 19  CREATININE 1.35* 1.22  CALCIUM 8.6* 8.3*    Liver Function Tests: No results for input(s): "AST", "ALT", "ALKPHOS", "BILITOT", "PROT", "ALBUMIN" in the last 168 hours. CBG: Recent Labs  Lab 12/25/21 1819 12/25/21 2157 12/26/21 0938 12/26/21 1146  GLUCAP 106* 308* 230* 219*    Discharge time spent: greater than 30 minutes.  This record has been created using Systems analyst. Errors have been sought and corrected,but may not always be located. Such creation errors do not reflect on the standard of care.   Signed: Lorella Nimrod, MD [facility] 12/26/2021

## 2021-12-27 DIAGNOSIS — M25561 Pain in right knee: Secondary | ICD-10-CM | POA: Diagnosis not present

## 2021-12-27 LAB — PROTEIN, BODY FLUID (OTHER): Total Protein, Body Fluid Other: 2.7 g/dL

## 2021-12-27 LAB — GLUCOSE, CAPILLARY: Glucose-Capillary: 136 mg/dL — ABNORMAL HIGH (ref 70–99)

## 2021-12-27 LAB — GLUCOSE, BODY FLUID OTHER: Glucose, Body Fluid Other: 173 mg/dL

## 2021-12-27 MED ORDER — CEPHALEXIN 500 MG PO CAPS: 500.0000 mg | ORAL_CAPSULE | Freq: Three times a day (TID) | ORAL | Status: DC

## 2021-12-27 MED ORDER — DICLOFENAC SODIUM 1 % EX GEL
2.0000 g | Freq: Four times a day (QID) | CUTANEOUS | Status: DC
  Filled 2021-12-27: qty 100

## 2021-12-27 NOTE — Progress Notes (Signed)
Physical Therapy Treatment Patient Details Name: Paul Steinberger [name] MRN: [id number] DOB: 17-Mar-1945 Today's Date: 12/27/2021   History of Present Illness Pt is a 76 y.o. male presenting to hospital 12/25/21 with c/o R LE pain (ankle and knee); also L wrist pain.  Pt admitted with R knee pain, L wrist pain, and R ankle pain; leukocytosis, essential htn.  S/p arthrocentesis.  PMH includes NIDDM, htn, CKD, SFH, OSA, chronic pain.    PT Comments    Pt resting in recliner upon PT arrival; agreeable to therapy.  Pain R knee 8/10 at rest beginning/end of session but increased to 10/10 during ambulation (pt pre-medicated with pain meds); minimal pain reported in L wrist and R ankle during sessions activities.  During session pt SBA with transfers using RW and CGA to SBA ambulating 50 feet with RW use (limited distance d/t R knee pain).  Pt reports he does not have any steps to enter 1 level home and feels like he can manage at home with RW (pt only has short distances in home he has to navigate for daily activities).  PT recommendations updated to HHPT (MD notified).   Recommendations for follow up therapy are one component of a multi-disciplinary discharge planning process, led by the attending physician.  Recommendations may be updated based on patient status, additional functional criteria and insurance authorization.  Follow Up Recommendations  Home health PT Can patient physically be transported by private vehicle: Yes   Assistance Recommended at Discharge Intermittent Supervision/Assistance  Patient can return home with the following A little help with walking and/or transfers;A little help with bathing/dressing/bathroom;Assistance with cooking/housework;Assist for transportation;Help with stairs or ramp for entrance   Equipment Recommendations  Rolling walker (2 wheels);BSC/3in1    Recommendations for Other Services       Precautions / Restrictions Precautions Precautions:  Fall Restrictions Weight Bearing Restrictions: No     Mobility  Bed Mobility               General bed mobility comments: Deferred (pt in recliner beginning/end of session)    Transfers Overall transfer level: Needs assistance Equipment used: Rolling walker (2 wheels) Transfers: Sit to/from Stand Sit to Stand: Supervision           General transfer comment: increased effort to stand on own from recliner up to RW; steady    Ambulation/Gait Ambulation/Gait assistance: Min guard, Supervision Gait Distance (Feet): 50 Feet Assistive device: Rolling walker (2 wheels)   Gait velocity: decreased     General Gait Details: antalgic; decreased stance time R LE (with R knee mildly flexed during R LE stance time); increased effort to advance R LE; increased effort/time to take steps but overall steady and safe   Stairs             Wheelchair Mobility    Modified Rankin (Stroke Patients Only)       Balance Overall balance assessment: Needs assistance Sitting-balance support: No upper extremity supported, Feet supported Sitting balance-Leahy Scale: Normal Sitting balance - Comments: steady sitting reaching outside BOS   Standing balance support: Bilateral upper extremity supported, Reliant on assistive device for balance, During functional activity Standing balance-Leahy Scale: Good Standing balance comment: steady ambulating with RW use                            Cognition Arousal/Alertness: Awake/alert Behavior During Therapy: WFL for tasks assessed/performed Overall Cognitive Status: Within Functional Limits for tasks assessed  Exercises General Exercises - Lower Extremity Heel Slides: AAROM, Strengthening, Right, 10 reps, Supine    General Comments General comments (skin integrity, edema, etc.): R knee, R ankle, and L wrist edema noted.  Pt agreeable to PT session.       Pertinent Vitals/Pain Pain Assessment Pain Assessment: 0-10 Pain Score: 8  Pain Location: R knee Pain Descriptors / Indicators: Sharp, Shooting, Tender, Grimacing, Guarding, Aching Pain Intervention(s): Limited activity within patient's tolerance, Monitored during session, Premedicated before session, Repositioned Vitals (HR and O2 on room air) stable and WFL throughout treatment session.    Home Living            Prior Function            PT Goals (current goals can now be found in the care plan section) Acute Rehab PT Goals Patient Stated Goal: to improve pain and mobility PT Goal Formulation: With patient Time For Goal Achievement: 01/09/22 Potential to Achieve Goals: Good Progress towards PT goals: Progressing toward goals    Frequency    Min 2X/week      PT Plan Discharge plan needs to be updated (MD notified)    Co-evaluation              AM-PAC PT "6 Clicks" Mobility   Outcome Measure  Help needed turning from your back to your side while in a flat bed without using bedrails?: None Help needed moving from lying on your back to sitting on the side of a flat bed without using bedrails?: A Little Help needed moving to and from a bed to a chair (including a wheelchair)?: A Little Help needed standing up from a chair using your arms (e.g., wheelchair or bedside chair)?: A Little Help needed to walk in hospital room?: A Little Help needed climbing 3-5 steps with a railing? : A Lot 6 Click Score: 18    End of Session Equipment Utilized During Treatment: Gait belt Activity Tolerance: Patient limited by pain Patient left: in chair;with call bell/phone within reach;with chair alarm set;Other (comment) (R LE elevated on pillow) Nurse Communication: Mobility status;Precautions PT Visit Diagnosis: Other abnormalities of gait and mobility (R26.89);Muscle weakness (generalized) (M62.81);Pain Pain - Right/Left: Right Pain - part of body: Knee;Ankle and joints  of foot     Time: 9191-6606 PT Time Calculation (min) (ACUTE ONLY): 18 min  Charges:  $Gait Training: 8-22 mins                     Leitha Bleak, PT 12/27/21, 9:54 AM

## 2021-12-27 NOTE — TOC Transition Note (Addendum)
Transition of Care [facility]) - CM/SW Discharge Note   Patient Details  Name: Paul Meddaugh [name] MRN: [id number] Date of Birth: 1946-01-23  [facility] of [facility] CM/SW Contact:  Izola Price, RN Phone Number: 12/27/2021, 11:34 AM   Clinical Narrative: 12/3: Patient now discharging home with [facility] PT/OT via [place] per Penni Homans, on call for Meg W. DME ordered via [employer] to be delivered to room per spouse who RN CM spoke to as patient was unavailable. No preferences on [facility] or DME agency.  RW and BS ordered via [employer]. Spouse will arrange transportation home. Paul Davies RN CM   Update: Patient left the building before DME, which was on the way to be delivered to the room, arrived [employer] and it will be delivered to home address tomorrow. Nephew came to get patient for transport home. Paul Davies RN CM   Final next level of care: Home w Home Health Services Barriers to Discharge: Barriers Resolved   Patient Goals and CMS Choice     Choice offered to / list presented to : Spouse  Discharge Placement                       Discharge Plan and Services                DME Arranged: 3-N-1, Walker rolling DME Agency: [employer]       HH Arranged: PT, OT HH Agency: [place] Date [place]: 12/27/21 Time [place]: 1134 Representative spoke with at [place]: Penni Homans on call for [employer].  Social Determinants of Health (SDOH) Interventions     Readmission Risk Interventions     No data to display

## 2021-12-27 NOTE — Evaluation (Signed)
Occupational Therapy Evaluation Patient Details Name: Paul Vangilder [name] MRN: [id number] DOB: [birth year] Today's Date: 12/27/2021   History of Present Illness Pt is a 76 year old male admitted presenting with R ankle and R knee pain, s/p arthrocentesis; PMH significant for hypertension, hyperlipidemia, diabetes mellitus, hypothyroidism, testicular mass, CKD-3, BPH, prostatitis, orchitis, epididymitis, SDH, OSA, chronic pain, obesity obesity BMI 30.48   Clinical Impression   Chart reviewed, RN cleared pt for participaton in OT evaluation and pt is pre medicated prior to moiblity. Pt is alert and oriented x4, agreeable to OT evaluation. PTA pt reports he is MOD I in ADL/IADL, amb with no AD, occasionally working in [employer] information [employer]. Pt presents with deficits in activity tolerance, balance affecting safe and optimal ADL completion. Pain is also a contributing factor to performance. Pt performs bed mobility with supervision, STS with CGA from multiple surfaces, LB dressing with MOD A (simulated/anticipated), amb in room with RW with CGA approx 20'. L wrist noted with edema educated on ROM, pain relief,  further follow up if warranted. Discussion re: home safey, discharge plan, DME use. Recommend pt discharge home with [place] with assist for ADLs. Pt reports his wife can help, however she does have visual deficits and he does not like to ask for help, but agrees to. Pt is left in bedside chair, all needs met. OT Will continue to follow acutely.      Recommendations for follow up therapy are one component of a multi-disciplinary discharge planning process, led by the attending physician.  Recommendations may be updated based on patient status, additional functional criteria and insurance authorization.   Follow Up Recommendations  Home health OT (if pt does not have assistance for ADL, would recommend considering STR)     Assistance Recommended at Discharge Intermittent Supervision/Assistance   Patient can return home with the following A lot of help with walking and/or transfers;A little help with bathing/dressing/bathroom    Functional Status Assessment  Patient has had a recent decline in their functional status and demonstrates the ability to make significant improvements in function in a reasonable and predictable amount of time.  Equipment Recommendations  BSC/3in1;Tub/shower bench;Wheelchair (measurements OT)    Recommendations for Other Services       Precautions / Restrictions Precautions Precautions: Fall Restrictions Weight Bearing Restrictions: No      Mobility Bed Mobility Overal bed mobility: Needs Assistance Bed Mobility: Supine to Sit, Sit to Supine     Supine to sit: Supervision Sit to supine: Supervision        Transfers Overall transfer level: Needs assistance Equipment used: Rolling walker (2 wheels) Transfers: Sit to/from Stand Sit to Stand: Supervision, Min guard (multiple attempts from chair and low bed)                  Balance Overall balance assessment: Needs assistance Sitting-balance support: No upper extremity supported, Feet supported Sitting balance-Leahy Scale: Good     Standing balance support: Bilateral upper extremity supported, Reliant on assistive device for balance Standing balance-Leahy Scale: Fair                             ADL either performed or assessed with clinical judgement   ADL Overall ADL's : Needs assistance/impaired Eating/Feeding: Set up;Sitting   Grooming: Wash/dry hands;Wash/dry face;Sitting;Set up               Lower Body Dressing: Moderate assistance Lower Body Dressing Details (indicate cue  type and reason): for RLE due to pain, reports wife can assist as needed Toilet Transfer: Min guard;Rolling walker (2 wheels) Toilet Transfer Details (indicate cue type and reason): simulated to bedside chair, frequent vcs for RW technique Toileting- Clothing Manipulation and  Hygiene: Minimal assistance Toileting - Clothing Manipulation Details (indicate cue type and reason): anticipated     Functional mobility during ADLs: Supervision/safety;Min guard;Rolling walker (2 wheels) (approx 20' in room with RW, frequent vcs for RW technique)       Vision Patient Visual Report: No change from baseline       Perception     Praxis      Pertinent Vitals/Pain Pain Assessment Pain Assessment: 0-10 Pain Score: 8  Pain Location: R knee, L wrist Pain Descriptors / Indicators: Sharp, Shooting, Tender, Grimacing, Guarding, Aching Pain Intervention(s): Limited activity within patient's tolerance, Monitored during session, Premedicated before session, Repositioned     Hand Dominance Right   Extremity/Trunk Assessment Upper Extremity Assessment Upper Extremity Assessment: LUE deficits/detail LUE Deficits / Details: AROM: extension almost to full AROM 5-10 degrees decreaged ROM, flexion appears WFL; edema noted on L lateral side of wrist; PROM appears WFL LUE Sensation: WNL LUE Coordination: WNL   Lower Extremity Assessment Lower Extremity Assessment: Defer to PT evaluation   Cervical / Trunk Assessment Cervical / Trunk Assessment: Normal   Communication Communication Communication: No difficulties   Cognition Arousal/Alertness: Awake/alert Behavior During Therapy: WFL for tasks assessed/performed Overall Cognitive Status: Within Functional Limits for tasks assessed                                       General Comments  R knee, R ankle, L wrist edema noted    Exercises Other Exercises Other Exercises: edu re: role of OT, role of rehab, discharge recommendations, home safety, falls prevention, DME use   Shoulder Instructions      Home Living Family/patient expects to be discharged to:: Private residence Living Arrangements: Spouse/significant other Available Help at Discharge: Family Type of Home: House Home Access: Level entry      Home Layout: One level     Bathroom Shower/Tub: Teacher, early years/pre: Standard     Home Equipment: Other (comment) (adjustable bed)          Prior Functioning/Environment Prior Level of Function : Independent/Modified Independent               ADLs Comments: MOD I-I IN ADL, still works at times        OT Problem List: Decreased activity tolerance;Impaired balance (sitting and/or standing);Decreased knowledge of use of DME or AE      OT Treatment/Interventions: Self-care/ADL training;Patient/family education;Therapeutic exercise;Balance training;Therapeutic activities;DME and/or AE instruction    OT Goals(Current goals can be found in the care plan section) Acute Rehab OT Goals Patient Stated Goal: go home OT Goal Formulation: With patient Time For Goal Achievement: 01/10/22 Potential to Achieve Goals: Good ADL Goals Pt Will Perform Grooming: with modified independence Pt Will Perform Lower Body Dressing: with modified independence;sit to/from stand Pt Will Transfer to Toilet: with modified independence Pt Will Perform Toileting - Clothing Manipulation and hygiene: with modified independence  OT Frequency: Min 2X/week    Co-evaluation              AM-PAC OT "6 Clicks" Daily Activity     Outcome Measure Help from another person eating meals?: None Help  from another person taking care of personal grooming?: None Help from another person toileting, which includes using toliet, bedpan, or urinal?: A Little Help from another person bathing (including washing, rinsing, drying)?: A Little Help from another person to put on and taking off regular upper body clothing?: None Help from another person to put on and taking off regular lower body clothing?: A Little 6 Click Score: 21   End of Session Equipment Utilized During Treatment: Rolling walker (2 wheels) Nurse Communication: Mobility status  Activity Tolerance: Patient tolerated treatment  well Patient left: in chair;with call bell/phone within reach;with chair alarm set  OT Visit Diagnosis: Unsteadiness on feet (R26.81)                Time: 1779-3903 OT Time Calculation (min): 22 min Charges:  OT General Charges $OT Visit: 1 Visit OT Evaluation $OT Eval Low Complexity: 1 Low Shanon Payor, OTD OTR/L  12/27/21, 9:25 AM

## 2021-12-27 NOTE — Progress Notes (Signed)
Progress Note   Patient: Paul Earl Wallick [name] MRN:[id number] DOB: 03/15/1945 DOA: 12/25/2021     2 DOS: the patient was seen and examined on 12/27/2021   Brief hospital course: Taken from H&P.  Paul Earl Forgey [name] is a 76 y.o. male with medical history significant of hypertension, hyperlipidemia, diabetes mellitus, hypothyroidism, testicular mass, CKD-3, BPH, prostatitis, orchitis, epididymitis, SDH, OSA, chronic pain, obesity obesity BMI 30.48, who presents with pain in right ankle and right knee and left wrist for 5 days.  No fever or chills.  No injuries noted.  Some decrease in appetite and fatigue but no other significant change.  Data reviewed independently and ED Course: pt was found to have WBC 30.4, with neutrophil 29% and lymphocyte 62%.  Peripheral smear showed leukocytosis with slight left shift in myeloid series and lymphocytosis with abnormal morphology.  Uric acid 5.5, worsening renal function with creatinine 1.35, BUN 18, GFR 55 (baseline creatinine 1.08 on 07/06/2021), lactic acid 1.1.  Temperature 99.5, blood pressure 151/93, heart rate 76, RR 20, oxygen saturation 97% on room air.  Imaging with soft tissue swelling around ankle and wrist. Venous Doppler studies were negative for DVT.  Orthopedic surgery was consulted s/p arthrocentesis, preliminary cell count 296 with 72% neutrophil, preliminary cultures with no organism.  Adding crystal studies.  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  They ordered flow cytometry, hepatitis and HIV testing-still pending.  Recommends outpatient follow-up.  12/2: Blood pressure mildly elevated at 167/77.  Renal function improving and appears to be at baseline now.  Leukocytosis at 29.7.  CRP markedly elevated at 22.2 with normal ESR. Preliminary blood and synovial fluid cultures negative.  No crystals seen.  Uric acid was within normal limit. Patient received cefepime and vancomycin while in the  hospital. Most likely not infective, as preliminary cultures remain negative.  Patient is being discharged on Keflex for 5 days and need to have a close follow-up with orthopedic surgery and oncology for further management.  Patient also need to follow-up with oncology for abnormal labs and smear.  12/3: Patient was discharged yesterday on Keflex, due to excessive pain he was unable to participate with PT and they are initial recommendations for SNF.  Patient improved significantly today and PT recommendations changed to home health.  Patient would like to go home today and will follow-up with orthopedic surgery as an outpatient.  He will also follow-up with oncology.  Subjective: Patient was feeling much improved and able to participate with PT today.  He would like to go home.  Physical Exam: Vitals:   12/26/21 0745 12/26/21 1526 12/26/21 2355 12/27/21 0824  BP: (!) 167/77 (!) 150/73 (!) 151/76 (!) 172/85  Pulse: 64 74 66 (!) 58  Resp: 17  18 17  Temp: 97.9 F (36.6 C) 98.1 F (36.7 C) 98.2 F (36.8 C) 98.7 F (37.1 C)  TempSrc:      SpO2: 99% 97% 96% 100%  Weight:      Height:       General.     In no acute distress. Pulmonary.  Lungs clear bilaterally, normal respiratory effort. CV.  Regular rate and rhythm, no JVD, rub or murmur. Abdomen.  Soft, nontender, nondistended, BS positive. CNS.  Alert and oriented .  No focal neurologic deficit. Extremities.  No edema, no cyanosis, pulses intact and symmetrical. Psychiatry.  Judgment and insight appears normal.   Data Reviewed: Prior data reviewed  Family Communication: Discussed with patient  Disposition: Status   is: Inpatient   Planned Discharge Destination: Home with Home Health  Time spent: 35 minutes  This record has been created using Dragon voice recognition software. Errors have been sought and corrected,but may not always be located. Such creation errors do not reflect on the standard of care.    Author: Sumayya Amin, MD 12/27/2021 11:10 AM  For on call review [email or web address].  

## 2021-12-27 NOTE — Progress Notes (Signed)
12/3: Patient is not able to walk the distance required to go the bathroom, or he/she is unable to safely negotiate stairs required to access the bathroom.  A BSC will alleviate this problem. Simmie Davies RN CM

## 2021-12-28 ENCOUNTER — Other Ambulatory Visit: Payer: Self-pay

## 2021-12-28 DIAGNOSIS — D72829 Elevated white blood cell count, unspecified: Secondary | ICD-10-CM

## 2021-12-28 DIAGNOSIS — C9110 Chronic lymphocytic leukemia of B-cell type not having achieved remission: Secondary | ICD-10-CM

## 2021-12-28 HISTORY — DX: Chronic lymphocytic leukemia of B-cell type not having achieved remission: C91.10

## 2021-12-29 LAB — BODY FLUID CULTURE W GRAM STAIN
Culture: NO GROWTH
Gram Stain: NONE SEEN

## 2021-12-30 DIAGNOSIS — I129 Hypertensive chronic kidney disease with stage 1 through stage 4 chronic kidney disease, or unspecified chronic kidney disease: Secondary | ICD-10-CM | POA: Diagnosis not present

## 2021-12-30 DIAGNOSIS — G8929 Other chronic pain: Secondary | ICD-10-CM | POA: Diagnosis not present

## 2021-12-30 DIAGNOSIS — N1831 Chronic kidney disease, stage 3a: Secondary | ICD-10-CM | POA: Diagnosis not present

## 2021-12-30 DIAGNOSIS — E785 Hyperlipidemia, unspecified: Secondary | ICD-10-CM | POA: Diagnosis not present

## 2021-12-30 DIAGNOSIS — E039 Hypothyroidism, unspecified: Secondary | ICD-10-CM | POA: Diagnosis not present

## 2021-12-30 DIAGNOSIS — G4733 Obstructive sleep apnea (adult) (pediatric): Secondary | ICD-10-CM | POA: Diagnosis not present

## 2021-12-30 DIAGNOSIS — L03115 Cellulitis of right lower limb: Secondary | ICD-10-CM | POA: Diagnosis not present

## 2021-12-30 DIAGNOSIS — N400 Enlarged prostate without lower urinary tract symptoms: Secondary | ICD-10-CM | POA: Diagnosis not present

## 2021-12-30 DIAGNOSIS — M71161 Other infective bursitis, right knee: Secondary | ICD-10-CM | POA: Diagnosis not present

## 2021-12-30 DIAGNOSIS — E1122 Type 2 diabetes mellitus with diabetic chronic kidney disease: Secondary | ICD-10-CM | POA: Diagnosis not present

## 2021-12-30 LAB — COMP PANEL: LEUKEMIA/LYMPHOMA

## 2021-12-30 LAB — CULTURE, BLOOD (ROUTINE X 2)
Culture: NO GROWTH
Culture: NO GROWTH

## 2022-01-01 ENCOUNTER — Other Ambulatory Visit: Payer: Self-pay

## 2022-01-01 ENCOUNTER — Encounter: Payer: Self-pay | Admitting: Oncology

## 2022-01-01 ENCOUNTER — Inpatient Hospital Stay: Payer: No Typology Code available for payment source

## 2022-01-01 ENCOUNTER — Ambulatory Visit
Admission: RE | Admit: 2022-01-01 | Discharge: 2022-01-01 | Disposition: A | Discharge status: Home / Self Care | Payer: No Typology Code available for payment source | Source: Ambulatory Visit | Attending: Oncology | Admitting: Oncology

## 2022-01-01 ENCOUNTER — Inpatient Hospital Stay: Payer: No Typology Code available for payment source | Admitting: Oncology

## 2022-01-01 ENCOUNTER — Inpatient Hospital Stay: Payer: No Typology Code available for payment source | Attending: Oncology

## 2022-01-01 ENCOUNTER — Telehealth: Payer: Self-pay | Admitting: Oncology

## 2022-01-01 ENCOUNTER — Inpatient Hospital Stay (HOSPITAL_BASED_OUTPATIENT_CLINIC_OR_DEPARTMENT_OTHER): Payer: No Typology Code available for payment source | Admitting: Oncology

## 2022-01-01 VITALS — BP 176/90 | HR 60 | Temp 96.8°F | Wt 228.7 lb

## 2022-01-01 DIAGNOSIS — D649 Anemia, unspecified: Secondary | ICD-10-CM | POA: Diagnosis not present

## 2022-01-01 DIAGNOSIS — Z7189 Other specified counseling: Secondary | ICD-10-CM | POA: Diagnosis not present

## 2022-01-01 DIAGNOSIS — M25561 Pain in right knee: Secondary | ICD-10-CM | POA: Insufficient documentation

## 2022-01-01 DIAGNOSIS — M7989 Other specified soft tissue disorders: Secondary | ICD-10-CM | POA: Diagnosis not present

## 2022-01-01 DIAGNOSIS — M11261 Other chondrocalcinosis, right knee: Secondary | ICD-10-CM | POA: Diagnosis not present

## 2022-01-01 DIAGNOSIS — C9110 Chronic lymphocytic leukemia of B-cell type not having achieved remission: Secondary | ICD-10-CM | POA: Diagnosis not present

## 2022-01-01 DIAGNOSIS — N189 Chronic kidney disease, unspecified: Secondary | ICD-10-CM | POA: Diagnosis not present

## 2022-01-01 DIAGNOSIS — D72829 Elevated white blood cell count, unspecified: Secondary | ICD-10-CM

## 2022-01-01 DIAGNOSIS — D509 Iron deficiency anemia, unspecified: Secondary | ICD-10-CM | POA: Insufficient documentation

## 2022-01-01 LAB — CBC WITH DIFFERENTIAL/PLATELET
Abs Immature Granulocytes: 0.09 10*3/uL — ABNORMAL HIGH (ref 0.00–0.07)
Basophils Absolute: 0.1 10*3/uL (ref 0.0–0.1)
Basophils Relative: 0 %
Eosinophils Absolute: 0.4 10*3/uL (ref 0.0–0.5)
Eosinophils Relative: 1 %
HCT: 32.7 % — ABNORMAL LOW (ref 39.0–52.0)
Hemoglobin: 10.4 g/dL — ABNORMAL LOW (ref 13.0–17.0)
Immature Granulocytes: 0 %
Lymphocytes Relative: 69 %
Lymphs Abs: 23.1 10*3/uL — ABNORMAL HIGH (ref 0.7–4.0)
MCH: 28 pg (ref 26.0–34.0)
MCHC: 31.8 g/dL (ref 30.0–36.0)
MCV: 87.9 fL (ref 80.0–100.0)
Monocytes Absolute: 4 10*3/uL — ABNORMAL HIGH (ref 0.1–1.0)
Monocytes Relative: 12 %
Neutro Abs: 5.9 10*3/uL (ref 1.7–7.7)
Neutrophils Relative %: 18 %
Platelets: 576 10*3/uL — ABNORMAL HIGH (ref 150–400)
RBC: 3.72 MIL/uL — ABNORMAL LOW (ref 4.22–5.81)
RDW: 17.3 % — ABNORMAL HIGH (ref 11.5–15.5)
Smear Review: INCREASED
WBC: 33.5 10*3/uL — ABNORMAL HIGH (ref 4.0–10.5)
nRBC: 0 % (ref 0.0–0.2)

## 2022-01-01 LAB — FERRITIN: Ferritin: 56 ng/mL (ref 24–336)

## 2022-01-01 LAB — BASIC METABOLIC PANEL
Anion gap: 9 (ref 5–15)
BUN: 15 mg/dL (ref 8–23)
CO2: 26 mmol/L (ref 22–32)
Calcium: 8.7 mg/dL — ABNORMAL LOW (ref 8.9–10.3)
Chloride: 104 mmol/L (ref 98–111)
Creatinine, Ser: 1.05 mg/dL (ref 0.61–1.24)
GFR, Estimated: >60 mL/min (ref >60)
Glucose, Bld: 110 mg/dL — ABNORMAL HIGH (ref 70–99)
Potassium: 4.1 mmol/L (ref 3.5–5.1)
Sodium: 139 mmol/L (ref 135–145)

## 2022-01-01 LAB — IRON AND TIBC
Iron: 61 ug/dL (ref 45–182)
Saturation Ratios: 21 % (ref 17.9–39.5)
TIBC: 286 ug/dL (ref 250–450)
UIBC: 225 ug/dL

## 2022-01-01 LAB — RETIC PANEL
Immature Retic Fract: 19.6 % — ABNORMAL HIGH (ref 2.3–15.9)
RBC.: 3.73 MIL/uL — ABNORMAL LOW (ref 4.22–5.81)
Retic Count, Absolute: 53.7 10*3/uL (ref 19.0–186.0)
Retic Ct Pct: 1.4 % (ref 0.4–3.1)
Reticulocyte Hemoglobin: 33.2 pg (ref >27.9)

## 2022-01-01 NOTE — Assessment & Plan Note (Signed)
Discussed with patient

## 2022-01-01 NOTE — Assessment & Plan Note (Signed)
Persistent right knee pain, recommend patient to follow up with orthopedic surgeon Check right knee xray.

## 2022-01-01 NOTE — Telephone Encounter (Signed)
LVM for pt daughter with CT appt info and Lab/MD follow up appt information.

## 2022-01-01 NOTE — Assessment & Plan Note (Signed)
Labs are reviewed and discussed with patient. Recommend CT chest abdomen pelvis for further evaluation.  Watchful waiting while waiting for additional work up.  Check [facility] and CLL FISH panel.

## 2022-01-01 NOTE — Progress Notes (Signed)
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   Right knee pain Persistent right knee pain, recommend patient to follow up with orthopedic surgeon Check right knee xray.   CLL (chronic lymphocytic leukemia) ([place]) Labs are reviewed and discussed with patient. Recommend CT chest abdomen pelvis for further evaluation.  Watchful waiting while waiting for additional work up.  Check IGVH and CLL FISH panel.    Normocytic anemia Check iron panel - normal.  Anemia is likely multifactorial, could be due to CLL, or CKD, etc    Orders Placed This Encounter  Procedures   CT CHEST ABDOMEN PELVIS WO CONTRAST    To be done : next available appointment    Standing Status:   Future    Standing Expiration Date:   01/02/2023    Order Specific Question:   If indicated for the ordered procedure, I authorize the administration of contrast media per Radiology protocol    Answer:   Yes    Order Specific Question:   Preferred imaging location?    Answer:   [facility]    Order Specific Question:   Is Oral Contrast requested for this exam?    Answer:   Yes, Per Radiology protocol   DG Knee 1-2 Views Right    Standing Status:   Future    Number of Occurrences:   1    Standing Expiration Date:   01/02/2023    Order Specific Question:   Reason for Exam (SYMPTOM  OR DIAGNOSIS REQUIRED)    Answer:   right knee pain and swelling    Order Specific Question:   Preferred imaging location?    Answer:    [facility] (send-out)    Standing Status:   Future    Standing Expiration Date:   01/02/2023    Order Specific Question:   Test name / description:    Answer:   IGVH somatic hypermutation [facility] [id number]   FISH HES [id number],3M19 REA    Standing Status:   Future    Standing Expiration Date:   01/02/2023   Comprehensive metabolic panel    Standing Status:   Future    Standing Expiration Date:   01/02/2023   CBC with Differential/Platelet    Standing Status:   Future    Standing Expiration Date:   01/02/2023   Vitamin B12    Standing Status:   Future    Standing Expiration Date:   01/02/2023   Folate    Standing Status:   Future    Standing Expiration Date:   01/02/2023   Lactate dehydrogenase    Standing Status:   Future    Standing Expiration Date:   01/02/2023   Ferritin    Standing Status:   Future    Number of Occurrences:   1    Standing Expiration Date:   01/02/2023   Iron and TIBC    Standing Status:   Future    Number of Occurrences:   1    Standing Expiration Date:   01/02/2023   Retic Panel  Standing Status:   Future    Number of Occurrences:   1    Standing Expiration Date:   01/02/2023   CBC with Differential/Platelet    Standing Status:   Future    Standing Expiration Date:   01/02/2023   Comprehensive metabolic panel    Standing Status:   Future    Standing Expiration Date:   01/01/2023   Follow up after CT  All questions were answered. The patient knows to call the clinic with any problems, questions or concerns.  Earlie Server, MD, PhD [facility] Hematology Oncology 01/01/2022    CHIEF COMPLAINTS/PURPOSE OF CONSULTATION:  CLL  HISTORY OF PRESENTING ILLNESS:  Paul Ramus [name] 76 y.o. male presents for follow up of CLL I have reviewed his chart and materials related to his cancer extensively and collaborated history with the patient. Summary of oncologic history is as follows: Oncology History  CLL (chronic lymphocytic  leukemia) ([place])  12/25/2021 - 12/26/2021 Hospital Admission   Patient was admitted due to pain and swelling of right ankle, right knee and left wrist.  Patient was found to have WBC 30.4, with neutrophil 29% and lymphocyte 2%.  Peripheral smear showed leukocytosis with slight left shift in myeloid series and lymphocytosis with abnormal morphology.  Uric acid 5.5, worsening renal function with creatinine 1.35, BUN 18, GFR 55 (baseline creatinine 1.08 on 07/06/2021), lactic acid 1.1.  ESR normal, uric acid 5.5 normal, CRP 22.2, LDH 182  Orthopedic surgery was consulted s/p arthrocentesis, cell count 296 with 72% neutrophil, no crystal. Fluid negative for growth.  Patient received cefepime and vancomycin while in the hospital, Patient was discharged on Keflex for 5 days.   12/28/2021 Cancer Staging   Staging form: Chronic Lymphocytic Leukemia / Small Lymphocytic Lymphoma, AJCC 8th Edition - Clinical stage from 12/28/2021: Modified Rai Stage III (Modified Rai risk: High, Lymphocytosis: Present, Adenopathy: Unknown, Organomegaly: Unknown, Anemia: Present, Thrombocytopenia: Absent) - Signed by Earlie Server, MD on 01/01/2022 Stage prefix: Initial diagnosis Hemoglobin (Hgb) (g/dL): 10.1   01/01/2022 Initial Diagnosis   CLL (chronic lymphocytic leukemia)   12/27/21 peripheral blood flowcytometry showed Involvement by CD5+, CD23+, CD20+, CD22+ clonal B cell population, phenotype typical for 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     Today he was accompanied by daughter.  Left wrist, right ankle pain have both resolved.  He continues to have right knee swelling and pain. He recalls twisting right knee prior to his presentation of symptoms.   Appetite is fair. No unintentional weight loss, night sweating,   MEDICAL HISTORY:  Past Medical History:   Diagnosis Date   Acute prostatitis    BPH (benign prostatic hyperplasia)    Chronic kidney disease    Dysplasia of prostate    Erectile dysfunction    HTN (hypertension)    Hypogonadism in male    Orchitis and epididymitis    Over weight    Palindromic rheumatism, hand    Prostatitis    Rectum pain    Testicle swelling    Testicle tenderness    Testicular mass     SURGICAL HISTORY: Past Surgical History:  Procedure Laterality Date   BACK SURGERY     head surgery     from fall  drilled hole in brain to relieve pressure    SOCIAL HISTORY: Social History   Socioeconomic History   Marital status:  Married    Spouse name: Not on file   Number of children: Not on file   Years of education: Not on file   Highest education level: Not on file  Occupational History   Not on file  Tobacco Use   Smoking status: Former   Smokeless tobacco: Never   Tobacco comments:    quit 30 years ago  Vaping Use   Vaping Use: Never used  Substance and Sexual Activity   Alcohol use: No    Alcohol/week: 0.0 standard drinks of alcohol   Drug use: No   Sexual activity: Not on file  Other Topics Concern   Not on file  Social History Narrative   Not on file   Social Determinants of Health   Financial Resource Strain: Not on file  Food Insecurity: No Food Insecurity (12/25/2021)   Hunger Vital Sign    Worried About Running Out of Food in the Last Year: Never true    Ran Out of Food in the Last Year: Never true  Transportation Needs: No Transportation Needs (12/25/2021)   PRAPARE - Hydrologist (Medical): No    Lack of Transportation (Non-Medical): No  Physical Activity: Not on file  Stress: Not on file  Social Connections: Not on file  Intimate Partner Violence: Not At Risk (12/25/2021)   Humiliation, Afraid, Rape, and Kick questionnaire    Fear of Current or Ex-Partner: No    Emotionally Abused: No    Physically Abused: No    Sexually Abused: No     FAMILY HISTORY: Family History  Problem Relation Age of Onset   Cancer Maternal Aunt    Prostate cancer Neg Hx    Kidney disease Neg Hx    Kidney cancer Neg Hx    Bladder Cancer Neg Hx     ALLERGIES:  has No Known Allergies.  MEDICATIONS:  Current Outpatient Medications  Medication Sig Dispense Refill   amLODipine (NORVASC) 10 MG tablet Take 10 mg by mouth daily.     Aspirin-Salicylamide-Caffeine (ARTHRITIS STRENGTH BC POWDER PO) Take by mouth.     levothyroxine (SYNTHROID) 175 MCG tablet Take 1 tablet (175 mcg total) by mouth daily. 90 tablet 1   metFORMIN (GLUCOPHAGE) 500 MG tablet Take 500 mg by mouth 2 (two) times daily with a meal.     oxyCODONE-acetaminophen (PERCOCET) 10-325 MG tablet Take 1 tablet by mouth every 8 (eight) hours as needed. 90 tablet 0   [START ON 01/30/2022] oxyCODONE-acetaminophen (PERCOCET) 10-325 MG tablet Take 1 tablet by mouth every 8 (eight) hours as needed. 90 tablet 0   potassium chloride (KLOR-CON M) 10 MEQ tablet Take 10 mEq by mouth 2 (two) times daily.     rosuvastatin (CRESTOR) 10 MG tablet Take 1 tablet by mouth once daily 90 tablet 0   tamsulosin (FLOMAX) 0.4 MG CAPS capsule Take 1 capsule (0.4 mg total) by mouth daily. 90 capsule 3   valsartan-hydrochlorothiazide (DIOVAN-HCT) 320-25 MG tablet Take 1 tablet by mouth daily. 90 tablet 3   No current facility-administered medications for this visit.    Review of Systems  Constitutional:  Positive for fatigue. Negative for appetite change, chills, fever and unexpected weight change.  HENT:   Negative for hearing loss and voice change.   Eyes:  Negative for eye problems and icterus.  Respiratory:  Negative for chest tightness, cough and shortness of breath.   Cardiovascular:  Negative for chest pain and leg swelling.  Gastrointestinal:  Negative for abdominal  distention and abdominal pain.  Endocrine: Negative for hot flashes.  Genitourinary:  Negative for difficulty urinating, dysuria and  frequency.   Musculoskeletal:  Positive for arthralgias.       Right knee pain and swelling  Skin:  Negative for itching and rash.  Neurological:  Negative for light-headedness and numbness.  Hematological:  Negative for adenopathy. Does not bruise/bleed easily.  Psychiatric/Behavioral:  Negative for confusion.      PHYSICAL EXAMINATION: ECOG PERFORMANCE STATUS: 1 - Symptomatic but completely ambulatory  Vitals:   01/01/22 0912  BP: (!) 176/90  Pulse: 60  Temp: (!) 96.8 F (36 C)  SpO2: 100%   Filed Weights   01/01/22 0912  Weight: 228 lb 11.2 oz (103.7 kg)    Physical Exam Constitutional:      General: He is not in acute distress.    Appearance: He is not diaphoretic.  HENT:     Head: Normocephalic and atraumatic.     Nose: Nose normal.     Mouth/Throat:     Pharynx: No oropharyngeal exudate.  Eyes:     General: No scleral icterus.    Pupils: Pupils are equal, round, and reactive to light.  Cardiovascular:     Rate and Rhythm: Normal rate.     Heart sounds: No murmur heard. Pulmonary:     Effort: Pulmonary effort is normal. No respiratory distress.     Breath sounds: No wheezing.  Abdominal:     General: There is no distension.     Palpations: Abdomen is soft.     Tenderness: There is no abdominal tenderness.  Musculoskeletal:     Cervical back: Normal range of motion and neck supple.     Comments: Right knee swelling and tenderness.   Skin:    General: Skin is warm and dry.     Findings: No erythema.  Neurological:     Mental Status: He is alert and oriented to person, place, and time. Mental status is at baseline.     Cranial Nerves: No cranial nerve deficit.     Motor: No abnormal muscle tone.  Psychiatric:        Mood and Affect: Mood and affect normal.      LABORATORY DATA:  I have reviewed the data as listed    Latest Ref Rng & Units 01/01/2022    9:03 AM 12/26/2021    4:55 AM 12/25/2021   12:50 PM  CBC  WBC 4.0 - 10.5 K/uL 33.5  29.7  30.4    Hemoglobin 13.0 - 17.0 g/dL 10.4  10.1  9.6   Hematocrit 39.0 - 52.0 % 32.7  31.4  30.5   Platelets 150 - 400 K/uL 576  229  195       Latest Ref Rng & Units 01/01/2022    9:03 AM 12/26/2021    4:55 AM 12/25/2021   12:50 PM  CMP  Glucose 70 - 99 mg/dL 110  299  188   BUN 8 - 23 mg/dL _0 Creatinine 0.61 - 1.24 mg/dL 1.05  1.22  1.35   Sodium 135 - 145 mmol/L 139  139  140   Potassium 3.5 - 5.1 mmol/L 4.1  4.1  3.6   Chloride 98 - 111 mmol/L 104  108  105   CO2 22 - 32 mmol/L _1 Calcium 8.9 - 10.3 mg/dL 8.7  8.3  8.6      RADIOGRAPHIC STUDIES: I have  personally reviewed the radiological images as listed and agreed with the findings in the report. US Venous Img Lower Unilateral Right  Result Date: 12/25/2021 CLINICAL DATA:  Right lower extremity swelling and pain for 5 days. EXAM: RIGHT LOWER EXTREMITY VENOUS DOPPLER ULTRASOUND TECHNIQUE: Gray-scale sonography with compression, as well as color and duplex ultrasound, were performed to evaluate the deep venous system(s) from the level of the common femoral vein through the popliteal and proximal calf veins. COMPARISON:  None Available. FINDINGS: VENOUS Normal compressibility of the common femoral, superficial femoral, and popliteal veins, as well as the visualized calf veins. Visualized portions of profunda femoral vein and great saphenous vein unremarkable. No filling defects to suggest DVT on grayscale or color Doppler imaging. Doppler waveforms show normal direction of venous flow, normal respiratory plasticity and response to augmentation. Limited views of the contralateral common femoral vein are unremarkable. OTHER Normal morphology right groin lymph nodes are identified, largest measures 2 cm. Limitations: none IMPRESSION: Negative. Electronically Signed   By: Abelardo Diesel M.D.   On: 12/25/2021 16:19   DG Wrist Complete Left  Result Date: 12/25/2021 CLINICAL DATA:  Left wrist pain and swelling.  No known trauma.  EXAM: LEFT WRIST - COMPLETE 3+ VIEW COMPARISON:  None Available. FINDINGS: There is 4 mm ulnar negative variance with mild distal radioulnar joint space narrowing and peripheral osteophytosis. Mild chondrocalcinosis within the distal aspect of the triangular fibrocartilage complex. Mild triscaphe and thumb carpometacarpal joint space narrowing and peripheral osteophytosis. No acute fracture or dislocation. IMPRESSION: 1. 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 2. Mild triscaphe and thumb carpometacarpal osteoarthritis. 3. Mild chondrocalcinosis within the distal aspect of the triangular fibrocartilage complex. Electronically Signed   By: Yvonne Kendall M.D.   On: 12/25/2021 11:43   DG Ankle Complete Right  Result Date: 12/25/2021 CLINICAL DATA:  Right ankle pain and swelling. EXAM: RIGHT ANKLE - COMPLETE 3+ VIEW COMPARISON:  Right foot radiographs 12/23/2008, right tibia and fibula radiographs 12/23/2008 FINDINGS: The ankle mortise is symmetric and intact. Mild-to-moderate distal medial malleolar degenerative spurring. Moderate plantar and posterior calcaneal heel spurs, slightly increased from remote 2010 radiographs. Moderate dorsal talonavicular, navicular-cuneiform, and tarsometatarsal degenerative osteophytes on lateral view. No acute fracture or dislocation. Mild-to-moderate medial and mild lateral ankle soft tissue swelling. IMPRESSION: 1. Mild-to-moderate medial and mild lateral ankle soft tissue swelling. No acute fracture. 2. Moderate plantar and posterior calcaneal heel spurs. Electronically Signed   By: Yvonne Kendall M.D.   On: 12/25/2021 11:42

## 2022-01-01 NOTE — Assessment & Plan Note (Signed)
Check iron panel - normal.  Anemia is likely multifactorial, could be due to CLL, or CKD, etc

## 2022-01-04 ENCOUNTER — Ambulatory Visit
Admission: RE | Admit: 2022-01-04 | Discharge: 2022-01-04 | Disposition: A | Discharge status: Home / Self Care | Payer: No Typology Code available for payment source | Source: Ambulatory Visit | Attending: Oncology | Admitting: Oncology

## 2022-01-04 DIAGNOSIS — M25561 Pain in right knee: Secondary | ICD-10-CM | POA: Diagnosis not present

## 2022-01-04 DIAGNOSIS — R918 Other nonspecific abnormal finding of lung field: Secondary | ICD-10-CM | POA: Diagnosis not present

## 2022-01-04 DIAGNOSIS — N289 Disorder of kidney and ureter, unspecified: Secondary | ICD-10-CM | POA: Diagnosis not present

## 2022-01-04 DIAGNOSIS — E119 Type 2 diabetes mellitus without complications: Secondary | ICD-10-CM | POA: Diagnosis not present

## 2022-01-04 DIAGNOSIS — C9110 Chronic lymphocytic leukemia of B-cell type not having achieved remission: Secondary | ICD-10-CM | POA: Diagnosis not present

## 2022-01-04 DIAGNOSIS — K5790 Diverticulosis of intestine, part unspecified, without perforation or abscess without bleeding: Secondary | ICD-10-CM | POA: Diagnosis not present

## 2022-01-06 DIAGNOSIS — E039 Hypothyroidism, unspecified: Secondary | ICD-10-CM | POA: Diagnosis not present

## 2022-01-06 DIAGNOSIS — L03115 Cellulitis of right lower limb: Secondary | ICD-10-CM | POA: Diagnosis not present

## 2022-01-06 DIAGNOSIS — E1122 Type 2 diabetes mellitus with diabetic chronic kidney disease: Secondary | ICD-10-CM | POA: Diagnosis not present

## 2022-01-06 DIAGNOSIS — M71161 Other infective bursitis, right knee: Secondary | ICD-10-CM | POA: Diagnosis not present

## 2022-01-06 DIAGNOSIS — N1831 Chronic kidney disease, stage 3a: Secondary | ICD-10-CM | POA: Diagnosis not present

## 2022-01-06 DIAGNOSIS — N400 Enlarged prostate without lower urinary tract symptoms: Secondary | ICD-10-CM | POA: Diagnosis not present

## 2022-01-06 DIAGNOSIS — G8929 Other chronic pain: Secondary | ICD-10-CM | POA: Diagnosis not present

## 2022-01-06 DIAGNOSIS — I129 Hypertensive chronic kidney disease with stage 1 through stage 4 chronic kidney disease, or unspecified chronic kidney disease: Secondary | ICD-10-CM | POA: Diagnosis not present

## 2022-01-06 DIAGNOSIS — E785 Hyperlipidemia, unspecified: Secondary | ICD-10-CM | POA: Diagnosis not present

## 2022-01-06 DIAGNOSIS — G4733 Obstructive sleep apnea (adult) (pediatric): Secondary | ICD-10-CM | POA: Diagnosis not present

## 2022-01-20 ENCOUNTER — Inpatient Hospital Stay: Payer: No Typology Code available for payment source

## 2022-01-20 ENCOUNTER — Inpatient Hospital Stay (HOSPITAL_BASED_OUTPATIENT_CLINIC_OR_DEPARTMENT_OTHER): Payer: No Typology Code available for payment source | Admitting: Oncology

## 2022-01-20 ENCOUNTER — Encounter: Payer: Self-pay | Admitting: Oncology

## 2022-01-20 VITALS — BP 161/91 | HR 63 | Temp 96.6°F | Wt 223.8 lb

## 2022-01-20 DIAGNOSIS — D649 Anemia, unspecified: Secondary | ICD-10-CM | POA: Diagnosis not present

## 2022-01-20 DIAGNOSIS — C9110 Chronic lymphocytic leukemia of B-cell type not having achieved remission: Secondary | ICD-10-CM

## 2022-01-20 DIAGNOSIS — Z7189 Other specified counseling: Secondary | ICD-10-CM

## 2022-01-20 LAB — VITAMIN B12: Vitamin B-12: 432 pg/mL (ref 180–914)

## 2022-01-20 LAB — CBC WITH DIFFERENTIAL/PLATELET
Abs Immature Granulocytes: 0.08 10*3/uL — ABNORMAL HIGH (ref 0.00–0.07)
Basophils Absolute: 0.1 10*3/uL (ref 0.0–0.1)
Basophils Relative: 0 %
Eosinophils Absolute: 0.3 10*3/uL (ref 0.0–0.5)
Eosinophils Relative: 1 %
HCT: 33.3 % — ABNORMAL LOW (ref 39.0–52.0)
Hemoglobin: 10.7 g/dL — ABNORMAL LOW (ref 13.0–17.0)
Immature Granulocytes: 0 %
Lymphocytes Relative: 71 %
Lymphs Abs: 20.4 10*3/uL — ABNORMAL HIGH (ref 0.7–4.0)
MCH: 28.2 pg (ref 26.0–34.0)
MCHC: 32.1 g/dL (ref 30.0–36.0)
MCV: 87.9 fL (ref 80.0–100.0)
Monocytes Absolute: 3.6 10*3/uL — ABNORMAL HIGH (ref 0.1–1.0)
Monocytes Relative: 12 %
Neutro Abs: 4.7 10*3/uL (ref 1.7–7.7)
Neutrophils Relative %: 16 %
Platelets: 161 10*3/uL (ref 150–400)
RBC: 3.79 MIL/uL — ABNORMAL LOW (ref 4.22–5.81)
RDW: 18 % — ABNORMAL HIGH (ref 11.5–15.5)
Smear Review: NORMAL
WBC: 29.1 10*3/uL — ABNORMAL HIGH (ref 4.0–10.5)
nRBC: 0 % (ref 0.0–0.2)

## 2022-01-20 LAB — COMPREHENSIVE METABOLIC PANEL
ALT: 22 U/L (ref 0–44)
AST: 35 U/L (ref 15–41)
Albumin: 3.8 g/dL (ref 3.5–5.0)
Alkaline Phosphatase: 71 U/L (ref 38–126)
Anion gap: 7 (ref 5–15)
BUN: 17 mg/dL (ref 8–23)
CO2: 26 mmol/L (ref 22–32)
Calcium: 8.7 mg/dL — ABNORMAL LOW (ref 8.9–10.3)
Chloride: 106 mmol/L (ref 98–111)
Creatinine, Ser: 1.05 mg/dL (ref 0.61–1.24)
GFR, Estimated: >60 mL/min (ref >60)
Glucose, Bld: 109 mg/dL — ABNORMAL HIGH (ref 70–99)
Potassium: 4 mmol/L (ref 3.5–5.1)
Sodium: 139 mmol/L (ref 135–145)
Total Bilirubin: 0.6 mg/dL (ref 0.3–1.2)
Total Protein: 7.1 g/dL (ref 6.5–8.1)

## 2022-01-20 LAB — FOLATE: Folate: 13.4 ng/mL (ref >5.9)

## 2022-01-20 LAB — LACTATE DEHYDROGENASE: LDH: 233 U/L — ABNORMAL HIGH (ref 98–192)

## 2022-01-20 MED ORDER — ZANUBRUTINIB 80 MG PO CAPS: 160.0000 mg | ORAL_CAPSULE | Freq: Two times a day (BID) | ORAL | 0 refills | Status: DC

## 2022-01-20 NOTE — Addendum Note (Signed)
Addended by: Earlie Server on: 01/20/2022 11:11 PM   Modules accepted: Orders

## 2022-01-20 NOTE — Assessment & Plan Note (Addendum)
Labs are reviewed and discussed with patient. CT chest abdomen pelvis results reviewed and discussed with patient.   Rai stage III CLL IGVH and CLL FISH panel are pending. Patient appears to have unintentional weight loss I recommend to initiate chemotherapy treatments.  Recommend Zanubrutinib 160 mg twice daily We discussed about rationale potential side effects of treatments.  Will check insurance coverage

## 2022-01-20 NOTE — Assessment & Plan Note (Addendum)
Check iron panel - normal.  Anemia is likely multifactorial, could be due to CLL

## 2022-01-20 NOTE — Assessment & Plan Note (Signed)
Discussed with patient

## 2022-01-20 NOTE — Progress Notes (Signed)
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 CT chest abdomen pelvis results reviewed and discussed with patient.   Rai stage III CLL IGVH and CLL FISH panel are pending. Patient appears to have unintentional weight loss I recommend to initiate chemotherapy treatments.  Recommend Zanubrutinib 160 mg twice daily We discussed about rationale potential side effects of treatments.  Will check insurance coverage   Normocytic anemia Check iron panel - normal.  Anemia is likely multifactorial, could be due to CLL  Goals of care, counseling/discussion Discussed with patient.     No orders of the defined types were placed in this encounter.  Follow up 2 weeks after starting on medication All questions were answered. The patient knows to call the clinic with any problems, questions or concerns.  Earlie Server, MD, PhD [facility] Hematology Oncology 01/20/2022    CHIEF COMPLAINTS/PURPOSE OF CONSULTATION:  CLL  HISTORY OF PRESENTING ILLNESS:  Paul Ramus [name] 76 y.o. male presents for follow up of CLL I have reviewed his chart and materials related to his cancer extensively and collaborated history with the patient. Summary of oncologic history is as follows: Oncology History  CLL (chronic lymphocytic leukemia) ([place])  12/25/2021 - 12/26/2021 Hospital Admission   Patient was admitted due to pain and swelling of right ankle, right knee and left wrist.   Patient was found to have WBC 30.4, with neutrophil 29% and lymphocyte 2%.  Peripheral smear showed leukocytosis with slight left shift in myeloid series and lymphocytosis with abnormal morphology.  Uric acid 5.5, worsening renal function with creatinine 1.35, BUN 18, GFR 55 (baseline creatinine 1.08 on 07/06/2021), lactic acid 1.1.  ESR normal, uric acid 5.5 normal, CRP 22.2, LDH 182  Orthopedic surgery was consulted s/p arthrocentesis, cell count 296 with 72% neutrophil, no crystal. Fluid negative for growth.  Patient received cefepime and vancomycin while in the hospital, Patient was discharged on Keflex for 5 days.   12/28/2021 Cancer Staging   Staging form: Chronic Lymphocytic Leukemia / Small Lymphocytic Lymphoma, AJCC 8th Edition - Clinical stage from 12/28/2021: Modified Rai Stage III (Modified Rai risk: High, Lymphocytosis: Present, Adenopathy: Present, Organomegaly: Absent, Anemia: Present, Thrombocytopenia: Absent) - Signed by Earlie Server, MD on 01/20/2022 Stage prefix: Initial diagnosis Hemoglobin (Hgb) (g/dL): 10.1   01/01/2022 Initial Diagnosis   CLL (chronic lymphocytic leukemia)   12/27/21 peripheral blood flowcytometry showed Involvement by CD5+, CD23+, CD20+, CD22+ clonal B cell population, phenotype typical for 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    01/05/2022 Imaging   CT chest abdomen pelvis wo contrast 1. Multiple prominent borderline enlarged and mildly enlarged lymph nodes, most evident in the low anatomic pelvis, as above, compatible with reported clinical history of CLL. 2. There also several small pulmonary nodules in the lungs measuring 5 mm or less in size. This is nonspecific, but statistically likely benign. No follow-up needed if patient is low-risk (and has no known or suspected primary  neoplasm).  Non-contrast chest CT can be considered in 12 months if patient is high-risk. This recommendation 3. Aortic atherosclerosis, in addition to left main and 2 vessel coronary artery disease. Please note that although the presence of coronary artery calcium documents the presence of coronary artery disease, the severity of this disease and any potential stenosis cannot be assessed on this non-gated CT examination. Assessment for  potential risk factor modification, dietary therapy or pharmacologic therapy may be warranted, if clinically indicated. 4. There are calcifications of the aortic valve. Echocardiographic correlation for evaluation of potential valvular dysfunction may be warranted if clinically indicated. 5. 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 6. Diverticulosis without evidence of acute diverticulitis at this time. 7. Mild cardiomegaly.    Today he was accompanied by daughter.  Left wrist, right ankle pain have both resolved.  During interval, patient establish care was also cardiac surgeon and received injection.  Pain has resolved. Daughter reports the patient does not eat well.   + Unintentional weight loss, he used to weigh 235 pounds 6 months ago.  Currently 223 pounds.  Lost 5 pounds since 3 weeks ago. Denies nice weight or fever.  Patient reports not having good appetite.  MEDICAL HISTORY:  Past Medical History:  Diagnosis Date   Acute prostatitis    BPH (benign prostatic hyperplasia)    Chronic kidney disease    Dysplasia of prostate    Erectile dysfunction    HTN (hypertension)    Hypogonadism in male    Orchitis and epididymitis    Over weight    Palindromic rheumatism, hand    Prostatitis    Rectum pain    Testicle swelling    Testicle tenderness    Testicular mass     SURGICAL HISTORY: Past Surgical History:  Procedure Laterality Date   BACK SURGERY     head surgery     from fall  drilled hole in brain to relieve pressure    SOCIAL  HISTORY: Social History   Socioeconomic History   Marital status: Married    Spouse name: Not on file   Number of children: Not on file   Years of education: Not on file   Highest education level: Not on file  Occupational History   Not on file  Tobacco Use   Smoking status: Former   Smokeless tobacco: Never   Tobacco comments:    quit 30 years ago  Vaping Use   Vaping Use: Never used  Substance and Sexual Activity   Alcohol use: No    Alcohol/week: 0.0 standard drinks of alcohol   Drug use: No   Sexual activity: Not on file  Other Topics Concern   Not on file  Social History Narrative   Not on file   Social Determinants of Health   Financial Resource Strain: Not on file  Food Insecurity: No Food Insecurity (12/25/2021)   Hunger Vital Sign    Worried About Running Out of Food in the Last Year: Never true    Ran Out of Food in the Last Year: Never true  Transportation Needs: No Transportation Needs (12/25/2021)   PRAPARE - Hydrologist (Medical): No    Lack of Transportation (Non-Medical): No  Physical Activity: Not on file  Stress: Not on file  Social Connections: Not on file  Intimate Partner Violence: Not At Risk (12/25/2021)   Humiliation, Afraid, Rape, and Kick questionnaire    Fear of Current or Ex-Partner: No  Emotionally Abused: No    Physically Abused: No    Sexually Abused: No    FAMILY HISTORY: Family History  Problem Relation Age of Onset   Cancer Maternal Aunt    Prostate cancer Neg Hx    Kidney disease Neg Hx    Kidney cancer Neg Hx    Bladder Cancer Neg Hx     ALLERGIES:  has No Known Allergies.  MEDICATIONS:  Current Outpatient Medications  Medication Sig Dispense Refill   amLODipine (NORVASC) 10 MG tablet Take 10 mg by mouth daily.     Aspirin-Salicylamide-Caffeine (ARTHRITIS STRENGTH BC POWDER PO) Take by mouth.     levothyroxine (SYNTHROID) 175 MCG tablet Take 1 tablet (175 mcg total) by mouth daily. 90  tablet 1   metFORMIN (GLUCOPHAGE) 500 MG tablet Take 500 mg by mouth 2 (two) times daily with a meal.     oxyCODONE-acetaminophen (PERCOCET) 10-325 MG tablet Take 1 tablet by mouth every 8 (eight) hours as needed. 90 tablet 0   [START ON 01/30/2022] oxyCODONE-acetaminophen (PERCOCET) 10-325 MG tablet Take 1 tablet by mouth every 8 (eight) hours as needed. 90 tablet 0   potassium chloride (KLOR-CON M) 10 MEQ tablet Take 10 mEq by mouth 2 (two) times daily.     rosuvastatin (CRESTOR) 10 MG tablet Take 1 tablet by mouth once daily 90 tablet 0   tamsulosin (FLOMAX) 0.4 MG CAPS capsule Take 1 capsule (0.4 mg total) by mouth daily. 90 capsule 3   valsartan-hydrochlorothiazide (DIOVAN-HCT) 320-25 MG tablet Take 1 tablet by mouth daily. 90 tablet 3   No current facility-administered medications for this visit.    Review of Systems  Constitutional:  Positive for fatigue. Negative for appetite change, chills, fever and unexpected weight change.  HENT:   Negative for hearing loss and voice change.   Eyes:  Negative for eye problems and icterus.  Respiratory:  Negative for chest tightness, cough and shortness of breath.   Cardiovascular:  Negative for chest pain and leg swelling.  Gastrointestinal:  Negative for abdominal distention and abdominal pain.  Endocrine: Negative for hot flashes.  Genitourinary:  Negative for difficulty urinating, dysuria and frequency.   Musculoskeletal:  Positive for arthralgias.       Right knee pain and swelling  Skin:  Negative for itching and rash.  Neurological:  Negative for light-headedness and numbness.  Hematological:  Negative for adenopathy. Does not bruise/bleed easily.  Psychiatric/Behavioral:  Negative for confusion.      PHYSICAL EXAMINATION: ECOG PERFORMANCE STATUS: 1 - Symptomatic but completely ambulatory  Vitals:   01/20/22 1344  BP: (!) 161/91  Pulse: 63  Temp: (!) 96.6 F (35.9 C)  SpO2: 100%   Filed Weights   01/20/22 1344  Weight: 223  lb 12.8 oz (101.5 kg)    Physical Exam Constitutional:      General: He is not in acute distress.    Appearance: He is not diaphoretic.  HENT:     Head: Normocephalic and atraumatic.     Nose: Nose normal.     Mouth/Throat:     Pharynx: No oropharyngeal exudate.  Eyes:     General: No scleral icterus.    Pupils: Pupils are equal, round, and reactive to light.  Cardiovascular:     Rate and Rhythm: Normal rate.     Heart sounds: No murmur heard. Pulmonary:     Effort: Pulmonary effort is normal. No respiratory distress.     Breath sounds: No wheezing.  Abdominal:  General: There is no distension.     Palpations: Abdomen is soft.     Tenderness: There is no abdominal tenderness.  Musculoskeletal:     Cervical back: Normal range of motion and neck supple.     Comments: Right knee swelling and tenderness.   Skin:    General: Skin is warm and dry.     Findings: No erythema.  Neurological:     Mental Status: He is alert and oriented to person, place, and time. Mental status is at baseline.     Cranial Nerves: No cranial nerve deficit.     Motor: No abnormal muscle tone.  Psychiatric:        Mood and Affect: Mood and affect normal.      LABORATORY DATA:  I have reviewed the data as listed    Latest Ref Rng & Units 01/20/2022    1:25 PM 01/01/2022    9:03 AM 12/26/2021    4:55 AM  CBC  WBC 4.0 - 10.5 K/uL 29.1  33.5  29.7   Hemoglobin 13.0 - 17.0 g/dL 10.7  10.4  10.1   Hematocrit 39.0 - 52.0 % 33.3  32.7  31.4   Platelets 150 - 400 K/uL 161  576  229       Latest Ref Rng & Units 01/20/2022    1:25 PM 01/01/2022    9:03 AM 12/26/2021    4:55 AM  CMP  Glucose 70 - 99 mg/dL 109  110  299   BUN 8 - 23 mg/dL _0 Creatinine 0.61 - 1.24 mg/dL 1.05  1.05  1.22   Sodium 135 - 145 mmol/L 139  139  139   Potassium 3.5 - 5.1 mmol/L 4.0  4.1  4.1   Chloride 98 - 111 mmol/L 106  104  108   CO2 22 - 32 mmol/L _1 Calcium 8.9 - 10.3 mg/dL 8.7  8.7  8.3    Total Protein 6.5 - 8.1 g/dL 7.1     Total Bilirubin 0.3 - 1.2 mg/dL 0.6     Alkaline Phos 38 - 126 U/L 71     AST 15 - 41 U/L 35     ALT 0 - 44 U/L 22        RADIOGRAPHIC STUDIES: I have personally reviewed the radiological images as listed and agreed with the findings in the report. CT CHEST ABDOMEN PELVIS WO CONTRAST  Result Date: 01/05/2022 CLINICAL DATA:  76 year old male with history of chronic lymphocytic leukemia. Follow-up study. * Tracking Code: BO * EXAM: CT CHEST, ABDOMEN AND PELVIS WITHOUT CONTRAST TECHNIQUE: Multidetector CT imaging of the chest, abdomen and pelvis was performed following the standard protocol without IV contrast. RADIATION DOSE REDUCTION: This exam was performed according to the departmental dose-optimization program which includes automated exposure control, adjustment of the mA and/or kV according to patient size and/or use of iterative reconstruction technique. COMPARISON:  Chest CT 09/17/2009. FINDINGS: CT CHEST FINDINGS Cardiovascular: Heart size is mildly enlarged. There is no significant pericardial fluid, thickening or pericardial calcification. There is aortic atherosclerosis, as well as atherosclerosis of the great vessels of the mediastinum and the coronary arteries, including calcified atherosclerotic plaque in the left main, left anterior descending and left circumflex coronary arteries. Mild calcifications of the aortic valve. Mediastinum/Nodes: No pathologically enlarged mediastinal or hilar lymph nodes. Please note that accurate exclusion of hilar adenopathy is limited on noncontrast CT scans. Esophagus is unremarkable  in appearance. Numerous prominent borderline enlarged axillary lymph nodes bilaterally measuring up to 9 mm in short axis, most of which have normal-appearing fatty hila. Lungs/Pleura: Small pulmonary nodules are noted throughout the lungs bilaterally, largest of which is in the left lower lobe (axial image 95 of series 3) measuring 5 mm.  No larger more suspicious appearing pulmonary nodules or masses are noted. No acute consolidative airspace disease. No pleural effusions. Scattered areas of mild linear scarring are noted in the periphery of the lungs. Musculoskeletal: There are no aggressive appearing lytic or blastic lesions noted in the visualized portions of the skeleton. CT ABDOMEN PELVIS FINDINGS Hepatobiliary: No definite suspicious cystic or solid hepatic lesions are confidently identified on today's noncontrast CT examination. Unenhanced appearance of the gallbladder is unremarkable. Pancreas: No definite pancreatic mass or peripancreatic fluid collections or inflammatory changes are noted on today's noncontrast CT examination. Spleen: Unremarkable. Adrenals/Urinary Tract: Low-attenuation lesions in the right kidney, incompletely characterized on today's noncontrast CT examination, but statistically likely to represent cysts, largest of which measures 4.1 cm in the interpolar region of the right kidney (no imaging follow-up recommended). Left kidney and right adrenal gland are normal in appearance. In the lateral limb of the left adrenal gland (axial image 61 of series 2) there is a 2.0 x 1.7 cm low-attenuation (1 Hounsfield unit) left adrenal nodule, compatible with a benign adenoma. No hydroureteronephrosis. Urinary bladder is unremarkable in appearance. Stomach/Bowel: The unenhanced appearance of the stomach is normal. No pathologic dilatation of small bowel or colon. A few scattered colonic diverticuli are noted, without surrounding inflammatory changes to indicate an acute diverticulitis at this time. Normal appendix. Vascular/Lymphatic: Atherosclerotic calcifications are noted in the abdominal aorta and pelvic vasculature. Multiple prominent borderline enlarged and mildly enlarged lymph nodes are noted in the abdomen and pelvis, most evident in the low anatomic pelvis where the largest of these measures up to 1.7 cm in the right  external iliac nodal station (axial image 108 of series 2), and 2.6 cm in short axis in the left external iliac nodal station (axial image 112 of series 2). Reproductive: Prostate gland and seminal vesicles are unremarkable in appearance. Other: No significant volume of ascites.  No pneumoperitoneum. Musculoskeletal: There are no aggressive appearing lytic or blastic lesions noted in the visualized portions of the skeleton. IMPRESSION: 1. Multiple prominent borderline enlarged and mildly enlarged lymph nodes, most evident in the low anatomic pelvis, as above, compatible with reported clinical history of CLL. 2. There also several small pulmonary nodules in the lungs measuring 5 mm or less in size. This is nonspecific, but statistically likely benign. No follow-up needed if patient is low-risk (and has no known or suspected primary neoplasm). Non-contrast chest CT can be considered in 12 months if patient is high-risk. This recommendation follows the consensus statement: Guidelines for Management of Incidental Pulmonary Nodules Detected on CT Images: From the Fleischner Society 2017; Radiology 2017; 284:228-243. 3. Aortic atherosclerosis, in addition to left main and 2 vessel coronary artery disease. Please note that although the presence of coronary artery calcium documents the presence of coronary artery disease, the severity of this disease and any potential stenosis cannot be assessed on this non-gated CT examination. Assessment for potential risk factor modification, dietary therapy or pharmacologic therapy may be warranted, if clinically indicated. 4. There are calcifications of the aortic valve. Echocardiographic correlation for evaluation of potential valvular dysfunction may be warranted if clinically indicated. 5. 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 6. Diverticulosis  without evidence of acute diverticulitis at this time. 7. Mild cardiomegaly. Electronically Signed   By: Vinnie Langton  M.D.   On: 01/05/2022 07:49   DG Knee 1-2 Views Right  Result Date: 01/02/2022 CLINICAL DATA:  Pain and swelling EXAM: RIGHT KNEE - 2 VIEW COMPARISON:  None Available. FINDINGS: No evidence of fracture, dislocation, or joint effusion. Joint spaces are maintained. Meniscal calcifications consistent with chondrocalcinosis. Superior patellar spurs identified. No evidence of osteolytic or osteoblastic changes. IMPRESSION: Chondrocalcinosis.  No acute osseous abnormalities. Electronically Signed   By: Sammie Bench M.D.   On: 01/02/2022 12:14   US Venous Img Lower Unilateral Right  Result Date: 12/25/2021 CLINICAL DATA:  Right lower extremity swelling and pain for 5 days. EXAM: RIGHT LOWER EXTREMITY VENOUS DOPPLER ULTRASOUND TECHNIQUE: Gray-scale sonography with compression, as well as color and duplex ultrasound, were performed to evaluate the deep venous system(s) from the level of the common femoral vein through the popliteal and proximal calf veins. COMPARISON:  None Available. FINDINGS: VENOUS Normal compressibility of the common femoral, superficial femoral, and popliteal veins, as well as the visualized calf veins. Visualized portions of profunda femoral vein and great saphenous vein unremarkable. No filling defects to suggest DVT on grayscale or color Doppler imaging. Doppler waveforms show normal direction of venous flow, normal respiratory plasticity and response to augmentation. Limited views of the contralateral common femoral vein are unremarkable. OTHER Normal morphology right groin lymph nodes are identified, largest measures 2 cm. Limitations: none IMPRESSION: Negative. Electronically Signed   By: Abelardo Diesel M.D.   On: 12/25/2021 16:19   DG Wrist Complete Left  Result Date: 12/25/2021 CLINICAL DATA:  Left wrist pain and swelling.  No known trauma. EXAM: LEFT WRIST - COMPLETE 3+ VIEW COMPARISON:  None Available. FINDINGS: There is 4 mm ulnar negative variance with mild distal radioulnar  joint space narrowing and peripheral osteophytosis. Mild chondrocalcinosis within the distal aspect of the triangular fibrocartilage complex. Mild triscaphe and thumb carpometacarpal joint space narrowing and peripheral osteophytosis. No acute fracture or dislocation. IMPRESSION: 1. 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 2. Mild triscaphe and thumb carpometacarpal osteoarthritis. 3. Mild chondrocalcinosis within the distal aspect of the triangular fibrocartilage complex. Electronically Signed   By: Yvonne Kendall M.D.   On: 12/25/2021 11:43   DG Ankle Complete Right  Result Date: 12/25/2021 CLINICAL DATA:  Right ankle pain and swelling. EXAM: RIGHT ANKLE - COMPLETE 3+ VIEW COMPARISON:  Right foot radiographs 12/23/2008, right tibia and fibula radiographs 12/23/2008 FINDINGS: The ankle mortise is symmetric and intact. Mild-to-moderate distal medial malleolar degenerative spurring. Moderate plantar and posterior calcaneal heel spurs, slightly increased from remote 2010 radiographs. Moderate dorsal talonavicular, navicular-cuneiform, and tarsometatarsal degenerative osteophytes on lateral view. No acute fracture or dislocation. Mild-to-moderate medial and mild lateral ankle soft tissue swelling. IMPRESSION: 1. Mild-to-moderate medial and mild lateral ankle soft tissue swelling. No acute fracture. 2. Moderate plantar and posterior calcaneal heel spurs. Electronically Signed   By: Yvonne Kendall M.D.   On: 12/25/2021 11:42

## 2022-01-21 ENCOUNTER — Telehealth: Payer: Self-pay

## 2022-01-21 ENCOUNTER — Other Ambulatory Visit (HOSPITAL_COMMUNITY): Payer: Self-pay

## 2022-01-21 ENCOUNTER — Telehealth: Payer: Self-pay | Admitting: Pharmacist

## 2022-01-21 DIAGNOSIS — C9110 Chronic lymphocytic leukemia of B-cell type not having achieved remission: Secondary | ICD-10-CM

## 2022-01-21 MED ORDER — ZANUBRUTINIB 80 MG PO CAPS
160.0000 mg | ORAL_CAPSULE | Freq: Two times a day (BID) | ORAL | 0 refills | Status: DC
  Filled 2022-01-21 (×2): qty 120, 30d supply, fill #0

## 2022-01-21 NOTE — Telephone Encounter (Signed)
Oral Oncology Patient Advocate Encounter   Began application for assistance for Brukinsa through [employer].   Application will be submitted upon completion of necessary supporting documentation.   [employer]'s phone number [phone].   I will continue to check the status until final determination.   [name], [facility] Patient [place]  [phone] (phone) [phone] (fax) 01/21/2022 9:17 AM

## 2022-01-21 NOTE — Telephone Encounter (Signed)
Oral Oncology Patient Advocate Encounter   Submitted application for assistance for Brukinsa to [employer].   Application submitted via e-fax to [phone]   [employer]'s phone number [phone].   I will continue to check the status until final determination.   [name], [facility] Patient [place]  [phone] (phone) [phone] (fax) 01/21/2022 2:11 PM

## 2022-01-21 NOTE — Telephone Encounter (Signed)
Oral Oncology Patient Advocate Encounter   Received notification that prior authorization for Brukinsa is required.   PA submitted on 01/21/22  Key [id number]  Status is pending     [name], [place] [facility] Patient [place]  [phone] (phone) [phone] (fax) 01/21/2022 8:27 AM

## 2022-01-21 NOTE — Telephone Encounter (Signed)
Received patient signatures. I will submit once I receive back MD signatures.   Paul Briggs, [facility] Patient [place]  [phone] (phone) [phone] (fax) 01/21/2022 1:47 PM

## 2022-01-21 NOTE — Telephone Encounter (Signed)
Oral Oncology Patient Advocate Encounter  Prior Authorization for Brukinsa has been approved.    PA# [id number]  Effective dates: 10/23/21 through 01/20/25  Patients co-pay is $2,997.66.    [name], [facility] Patient [place]  [phone] (phone) [phone] (fax) 01/21/2022 8:35 AM

## 2022-01-21 NOTE — Telephone Encounter (Signed)
Oral Chemotherapy Pharmacist Encounter  Patient Education I spoke with patient's daughter Warrick Parisian for overview of new oral chemotherapy medication: Brukinsa (zanubrutinib) for the treatment of newly diagnosed CLL, planned duration until disease progression or unacceptable drug toxicity.  Counseled Malisa on administration, dosing, side effects, monitoring, drug-food interactions, safe handling, storage, and disposal. Patient will take 2 capsules (160 mg total) by mouth 2 (two) times daily.  Side effects include but not limited to: fatigue, rash, decreased wbc/hgb/plt.    Reviewed with [facility] importance of keeping a medication schedule and plan for any missed doses.  After discussion with Malisa no patient barriers to medication adherence identified. Malisa was interested in a social work referral for her father, referral placed today.  Malisa  voiced understanding and appreciation. All questions answered. Medication handout provided.  Provided [facility] with Oral Chemotherapy Navigation Clinic phone number. Malisa knows to call the office with questions or concerns. Oral Chemotherapy Navigation Clinic will continue to follow.  Darl Pikes, PharmD, BCPS, BCOP, CPP Hematology/Oncology Clinical Pharmacist Practitioner [place]/[facility]/AP [facility] [phone]  01/21/2022 4:28 PM

## 2022-01-21 NOTE — Telephone Encounter (Signed)
Oral Oncology Pharmacist Encounter  Received new prescription for Brukinsa (zanubrutinib) for the treatment of newly diagnosed CLL, planned duration until disease progression or unacceptable drug toxicity.  CBC/CMP from 01/20/22 assessed, no relevant lab abnormalities. Prescription dose and frequency assessed.   Current medication list in Epic reviewed, no relevant DDIs with zanubrutinib identified.  Evaluated chart and no patient barriers to medication adherence identified.   Prescription has been e-scribed to the [facility] for benefits analysis and approval.  Oral Oncology Clinic will continue to follow for insurance authorization, copayment issues, initial counseling and start date.   Darl Pikes, PharmD, BCPS, BCOP, CPP Hematology/Oncology Clinical Pharmacist Practitioner [place]/DB/AP [facility] [place] [facility] [phone]  01/21/2022 8:47 AM

## 2022-01-21 NOTE — Telephone Encounter (Signed)
Oral Oncology Patient Advocate Encounter  Reached out and spoke with patient regarding PAP paperwork, explained that I would send it to their preferred email via DocuSign.   Confirmed email address: [email or web address].    Patient expressed understanding and consent.  Will follow up once paperwork has been signed and returned.   [name], [facility] Patient [place]  [phone] (phone) [phone] (fax) 01/21/2022 9:27 AM

## 2022-01-22 ENCOUNTER — Other Ambulatory Visit (HOSPITAL_COMMUNITY): Payer: Self-pay

## 2022-01-22 ENCOUNTER — Inpatient Hospital Stay: Payer: No Typology Code available for payment source | Admitting: Licensed Clinical Social Worker

## 2022-01-22 DIAGNOSIS — C9110 Chronic lymphocytic leukemia of B-cell type not having achieved remission: Secondary | ICD-10-CM

## 2022-01-22 NOTE — Progress Notes (Signed)
[place] Work  Initial Assessment   Yasiel Goyne [name] is a 76 y.o. year old male contacted caregiver by phone. Clinical Social Work was referred by medical provider for assessment of psychosocial needs.   SDOH (Social Determinants of Health) assessments performed: Yes SDOH Interventions    Flowsheet Row Clinical Support from 01/22/2022 in [facility] [place] Interventions   Housing Interventions Intervention Not Indicated  Transportation Interventions Intervention Not Indicated  Utilities Interventions Intervention Not Indicated  Alcohol Usage Interventions Intervention Not Indicated (Score <7)  Financial Strain Interventions Intervention Not Indicated  Physical Activity Interventions Intervention Not Indicated  Stress Interventions Provide Counseling  Social Connections Interventions Intervention Not Indicated       SDOH Screenings   Food Insecurity: No Food Insecurity (12/25/2021)  Housing: Low Risk  (01/22/2022)  Transportation Needs: No Transportation Needs (01/22/2022)  Utilities: Not At Risk (01/22/2022)  Alcohol Screen: Low Risk  (01/22/2022)  Depression (PHQ2-9): Low Risk  (12/01/2021)  Financial Resource Strain: Low Risk  (01/22/2022)  Physical Activity: Inactive (01/22/2022)  Social Connections: Socially Integrated (01/22/2022)  Stress: Stress Concern Present (01/22/2022)  Tobacco Use: Medium Risk (01/20/2022)     Distress Screen completed: No     No data to display            Family/Social Information:  Housing Arrangement: patient lives with spouse, main contact Irl Bodie daughter  [phone] Family members/support persons in your life? Family, Friends, and Geophysical data processor concerns: no, patient's spouse main transportation, CSW informed Ms. Haig about transportation services if there is a need for transportation in the future. Employment: Retired  .  Income source: Risk analyst concerns:  No immediate concerns. Type of concern:  overall financial concerns due to medical expenses Food access concerns: no, immediate concerns present Religious or spiritual practice: Not known Services Currently in place:  [place]  Coping/ Adjustment to diagnosis: Patient understands treatment plan and what happens next? yes Concerns about diagnosis and/or treatment: Pain or discomfort during procedures, Overwhelmed by information, How I will pay for the services I need, How will I care for myself, and Quality of life Patient reported stressors: Finances, Adjusting to my illness, and Physical issues Hopes and/or priorities: assistance with caregiver Patient enjoys  N/A Current coping skills/ strengths: Average or above average intelligence , Communication skills , Financial means , General fund of knowledge , Motivation for treatment/growth , Supportive family/friends , and Work skills     SUMMARY: Current SDOH Barriers:  Financial constraints related to fixed income, Limited social support, and Level of care concerns  Clinical Social Work Clinical Goal(s):  Patient will follow up with Nadine to request information on CAP program, and criteria for Medicaid application* as directed by SW  Interventions: Discussed common feeling and emotions when being diagnosed with cancer, and the importance of support during treatment Informed patient of the support team roles and support services at [facility] Provided [place] contact information and encouraged patient to call with any questions or concerns Referred patient to [employer], food assistance and Provided patient with information about CSW role in patient care and all other available resources. CSW will send Ms. Vanscyoc information on programs and resources to [email or web address]   Follow Up Plan: Patient will contact CSW with any support or resource needs Patient verbalizes  understanding of plan: Yes    Dajuan Turnley, LCSW

## 2022-01-26 LAB — FISH HES LEUKEMIA, 4Q12 REA

## 2022-01-27 DIAGNOSIS — E876 Hypokalemia: Secondary | ICD-10-CM | POA: Diagnosis not present

## 2022-01-28 NOTE — Telephone Encounter (Signed)
Received notification from [employer] that they could not accept digital signature from patient. I have spoken with patient's daughter, Warrick Parisian, and they will come in this afternoon 01/28/22 or Friday 01/29/22 to sign. I will continue to follow and update until final determination.  [name], [facility] Patient [place]  [phone] (phone) [phone] (fax) 01/28/2022 8:45 AM

## 2022-02-02 ENCOUNTER — Other Ambulatory Visit (HOSPITAL_COMMUNITY): Payer: Self-pay

## 2022-02-02 LAB — MISC LABCORP TEST (SEND OUT): Labcorp test code: 113753

## 2022-02-02 NOTE — Telephone Encounter (Signed)
Received new signatures for application and faxed over for processing to [phone]. I will continue to follow and update until final determination.  [name], [facility] Patient Paul Briggs  [phone] (phone) [phone] (fax) 02/02/2022 8:36 AM

## 2022-02-03 ENCOUNTER — Other Ambulatory Visit (HOSPITAL_COMMUNITY): Payer: Self-pay

## 2022-02-03 ENCOUNTER — Telehealth: Payer: Self-pay

## 2022-02-03 NOTE — Telephone Encounter (Signed)
Oral Oncology Patient Advocate Encounter   Was successful in securing patient an $3,250 grant from Patient [facility]) to provide copayment coverage for Brukinsa.  This will keep the out of pocket expense at $0.     I have spoken with the patient.    The billing information is as follows and has been shared with [place].   Member ID: [id number] Group ID: [id number] RxBin: [id number] Dates of Eligibility: 11/03/21 through 02/01/23  Fund:  Chronic Lymphocytic [place], [place] Patient [place]  [phone] (phone) [phone] (fax) 02/03/2022 2:06 PM

## 2022-02-03 NOTE — Telephone Encounter (Signed)
Received notification from [employer] that grant funds had opened up for patient's diagnosis and could not proceed with Patient Assistance until those funds were used. We were able to secure a grant for patient to cover cost of medication. [employer] will keep application on file for if/ when grant funds are exhausted and can continue with Manufacturer Assistance at that point. Patient is aware.   Paul Briggs, [facility] Patient [place]  [phone] (phone) [phone] (fax) 02/03/2022 2:13 PM

## 2022-02-04 NOTE — Progress Notes (Signed)
Enrolled patient into the [employer] and the [employer]

## 2022-02-05 ENCOUNTER — Telehealth: Payer: Self-pay

## 2022-02-05 ENCOUNTER — Inpatient Hospital Stay (HOSPITAL_BASED_OUTPATIENT_CLINIC_OR_DEPARTMENT_OTHER): Payer: No Typology Code available for payment source | Admitting: Oncology

## 2022-02-05 ENCOUNTER — Other Ambulatory Visit (HOSPITAL_COMMUNITY): Payer: Self-pay

## 2022-02-05 ENCOUNTER — Inpatient Hospital Stay: Payer: No Typology Code available for payment source | Attending: Oncology

## 2022-02-05 ENCOUNTER — Encounter: Payer: Self-pay | Admitting: Oncology

## 2022-02-05 VITALS — BP 162/85 | HR 62 | Temp 96.8°F | Resp 18 | Wt 224.2 lb

## 2022-02-05 DIAGNOSIS — C9110 Chronic lymphocytic leukemia of B-cell type not having achieved remission: Secondary | ICD-10-CM

## 2022-02-05 DIAGNOSIS — D649 Anemia, unspecified: Secondary | ICD-10-CM | POA: Diagnosis not present

## 2022-02-05 DIAGNOSIS — Z5111 Encounter for antineoplastic chemotherapy: Secondary | ICD-10-CM | POA: Diagnosis not present

## 2022-02-05 DIAGNOSIS — Z79899 Other long term (current) drug therapy: Secondary | ICD-10-CM | POA: Diagnosis not present

## 2022-02-05 LAB — COMPREHENSIVE METABOLIC PANEL
ALT: 17 U/L (ref 0–44)
AST: 29 U/L (ref 15–41)
Albumin: 3.8 g/dL (ref 3.5–5.0)
Alkaline Phosphatase: 62 U/L (ref 38–126)
Anion gap: 7 (ref 5–15)
BUN: 14 mg/dL (ref 8–23)
CO2: 26 mmol/L (ref 22–32)
Calcium: 8.5 mg/dL — ABNORMAL LOW (ref 8.9–10.3)
Chloride: 105 mmol/L (ref 98–111)
Creatinine, Ser: 1.16 mg/dL (ref 0.61–1.24)
GFR, Estimated: >60 mL/min (ref >60)
Glucose, Bld: 108 mg/dL — ABNORMAL HIGH (ref 70–99)
Potassium: 3.8 mmol/L (ref 3.5–5.1)
Sodium: 138 mmol/L (ref 135–145)
Total Bilirubin: 0.7 mg/dL (ref 0.3–1.2)
Total Protein: 6.7 g/dL (ref 6.5–8.1)

## 2022-02-05 LAB — CBC WITH DIFFERENTIAL/PLATELET
Abs Immature Granulocytes: 0.02 10*3/uL (ref 0.00–0.07)
Basophils Absolute: 0.1 10*3/uL (ref 0.0–0.1)
Basophils Relative: 0 %
Eosinophils Absolute: 0.2 10*3/uL (ref 0.0–0.5)
Eosinophils Relative: 1 %
HCT: 32.8 % — ABNORMAL LOW (ref 39.0–52.0)
Hemoglobin: 10.5 g/dL — ABNORMAL LOW (ref 13.0–17.0)
Immature Granulocytes: 0 %
Lymphocytes Relative: 82 %
Lymphs Abs: 16.1 10*3/uL — ABNORMAL HIGH (ref 0.7–4.0)
MCH: 28.4 pg (ref 26.0–34.0)
MCHC: 32 g/dL (ref 30.0–36.0)
MCV: 88.6 fL (ref 80.0–100.0)
Monocytes Absolute: 0.6 10*3/uL (ref 0.1–1.0)
Monocytes Relative: 3 %
Neutro Abs: 2.7 10*3/uL (ref 1.7–7.7)
Neutrophils Relative %: 14 %
Platelets: 136 10*3/uL — ABNORMAL LOW (ref 150–400)
RBC: 3.7 MIL/uL — ABNORMAL LOW (ref 4.22–5.81)
RDW: 18.6 % — ABNORMAL HIGH (ref 11.5–15.5)
WBC: 19.6 10*3/uL — ABNORMAL HIGH (ref 4.0–10.5)
nRBC: 0 % (ref 0.0–0.2)

## 2022-02-05 MED ORDER — OYSTER SHELL CALCIUM/D3 500-5 MG-MCG PO TABS: 2.0000 | ORAL_TABLET | Freq: Every day | ORAL | 2 refills | Status: DC

## 2022-02-05 NOTE — Assessment & Plan Note (Addendum)
Labs are reviewed and discussed with patient. CT chest abdomen pelvis results reviewed and discussed with patient.   Rai stage III CLL 13q delesion, IgVH unmutated Patient appears to have unintentional weight loss Labs are reviewed and discussed with patient. Continue Zanubrutinib 160 mg twice daily

## 2022-02-05 NOTE — Telephone Encounter (Signed)
Oral Oncology Patient Advocate Encounter  Was successful in securing patient a $8,000 grant from [employer] to provide copayment coverage for Brukinsa.  This will keep the out of pocket expense at $0.     [employer] ID: [id number]  I have spoken with the patient.   The billing information is as follows and has been shared with [place].    RxBin: [id number] PCN: [id number] Member ID: [id number] Group ID: [id number] Dates of Eligibility: 01/06/22 through 01/06/23  Fund:  Chronic Lymphocytic [place], [place] Patient [place]  [phone] (phone) [phone] (fax) 02/05/2022 1:55 PM

## 2022-02-05 NOTE — Assessment & Plan Note (Signed)
Stable. Anemia is likely multifactorial, could be due to CLL 

## 2022-02-06 DIAGNOSIS — Z5111 Encounter for antineoplastic chemotherapy: Secondary | ICD-10-CM | POA: Insufficient documentation

## 2022-02-06 NOTE — Progress Notes (Signed)
Hematology/Oncology Progress note [phone] [phone]        ASSESSMENT & PLAN:   Cancer Staging  CLL (chronic lymphocytic leukemia) ([place]) Staging form: Chronic Lymphocytic Leukemia / Small Lymphocytic Lymphoma, AJCC 8th Edition - Clinical stage from 12/28/2021: Modified Rai Stage III (Modified Rai risk: High, Lymphocytosis: Present, Adenopathy: Present, Organomegaly: Absent, Anemia: Present, Thrombocytopenia: Absent) - Signed by Earlie Server, MD on 01/20/2022   CLL (chronic lymphocytic leukemia) [facility]) Labs are reviewed and discussed with patient. CT chest abdomen pelvis results reviewed and discussed with patient.   Rai stage III CLL with unintentional weight loss 13q delesion, IgVH unmutated Labs are reviewed and discussed with patient.he tolerates well.  Continue Zanubrutinib 160 mg twice daily    Normocytic anemia Stable. Anemia is likely multifactorial, could be due to CLL  Encounter for antineoplastic chemotherapy Continue treatment as mentioned above.     Orders Placed This Encounter  Procedures   CBC with Differential/Platelet    Standing Status:   Future    Standing Expiration Date:   02/06/2023   Comprehensive metabolic panel    Standing Status:   Future    Standing Expiration Date:   02/05/2023    Follow up 4 weeks  All questions were answered. The patient knows to call the clinic with any problems, questions or concerns.  Earlie Server, MD, PhD [facility] Hematology Oncology 02/05/2022    CHIEF COMPLAINTS/PURPOSE OF CONSULTATION:  CLL  HISTORY OF PRESENTING ILLNESS:  Paul Ramus [name] 77 y.o. male presents for follow up of CLL I have reviewed his chart and materials related to his cancer extensively and collaborated history with the patient. Summary of oncologic history is as follows: Oncology History  CLL (chronic lymphocytic leukemia) ([place])  12/25/2021 - 12/26/2021 Hospital Admission   Patient was admitted due to pain and  swelling of right ankle, right knee and left wrist.  Patient was found to have WBC 30.4, with neutrophil 29% and lymphocyte 2%.  Peripheral smear showed leukocytosis with slight left shift in myeloid series and lymphocytosis with abnormal morphology.  Uric acid 5.5, worsening renal function with creatinine 1.35, BUN 18, GFR 55 (baseline creatinine 1.08 on 07/06/2021), lactic acid 1.1.  ESR normal, uric acid 5.5 normal, CRP 22.2, LDH 182  Orthopedic surgery was consulted s/p arthrocentesis, cell count 296 with 72% neutrophil, no crystal. Fluid negative for growth.  Patient received cefepime and vancomycin while in the hospital, Patient was discharged on Keflex for 5 days.   12/28/2021 Cancer Staging   Staging form: Chronic Lymphocytic Leukemia / Small Lymphocytic Lymphoma, AJCC 8th Edition - Clinical stage from 12/28/2021: Modified Rai Stage III (Modified Rai risk: High, Lymphocytosis: Present, Adenopathy: Present, Organomegaly: Absent, Anemia: Present, Thrombocytopenia: Absent) - Signed by Earlie Server, MD on 01/20/2022 Stage prefix: Initial diagnosis Hemoglobin (Hgb) (g/dL): 10.1   01/01/2022 Initial Diagnosis   CLL (chronic lymphocytic leukemia)   12/27/21 peripheral blood flowcytometry showed Involvement by CD5+, CD23+, CD20+, CD22+ clonal B cell population, phenotype typical for 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    01/05/2022 Imaging   CT chest abdomen pelvis wo contrast 1. Multiple prominent borderline enlarged and mildly enlarged lymph nodes, most evident in the low anatomic pelvis, as above, compatible with reported clinical history of CLL. 2. There also several small pulmonary nodules in the lungs measuring 5 mm or less in size. This is  nonspecific, but statistically likely benign. No follow-up needed if patient is low-risk (and  has no known or suspected primary neoplasm). Non-contrast chest CT can be considered in 12 months if patient is high-risk. This recommendation 3. Aortic atherosclerosis, in addition to left main and 2 vessel coronary artery disease. Please note that although the presence of coronary artery calcium documents the presence of coronary artery disease, the severity of this disease and any potential stenosis cannot be assessed on this non-gated CT examination. Assessment for  potential risk factor modification, dietary therapy or pharmacologic therapy may be warranted, if clinically indicated. 4. There are calcifications of the aortic valve. Echocardiographic correlation for evaluation of potential valvular dysfunction may be warranted if clinically indicated. 5. 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 6. Diverticulosis without evidence of acute diverticulitis at this time. 7. Mild cardiomegaly.    + Unintentional weight loss INTERVAL HISTORY Paul Goytia [name] is a 77 y.o. male who has above history reviewed by me today presents for follow up visit for  CLL. He takes Zanubrutinib 160 mg twice daily Tolerates well, appetite is fair.   MEDICAL HISTORY:  Past Medical History:  Diagnosis Date   Acute prostatitis    BPH (benign prostatic hyperplasia)    Chronic kidney disease    Dysplasia of prostate    Erectile dysfunction    HTN (hypertension)    Hypogonadism in male    Orchitis and epididymitis    Over weight    Palindromic rheumatism, hand    Prostatitis    Rectum pain    Testicle swelling    Testicle tenderness    Testicular mass     SURGICAL HISTORY: Past Surgical History:  Procedure Laterality Date   BACK SURGERY     head surgery     from fall  drilled hole in brain to relieve pressure    SOCIAL HISTORY: Social History   Socioeconomic History   Marital status: Married    Spouse name: Not on file   Number of children: Not on file   Years of  education: Not on file   Highest education level: Not on file  Occupational History   Not on file  Tobacco Use   Smoking status: Former   Smokeless tobacco: Never   Tobacco comments:    quit 30 years ago  Vaping Use   Vaping Use: Never used  Substance and Sexual Activity   Alcohol use: No    Alcohol/week: 0.0 standard drinks of alcohol   Drug use: No   Sexual activity: Not on file  Other Topics Concern   Not on file  Social History Narrative   Not on file   Social Determinants of Health   Financial Resource Strain: Low Risk  (01/22/2022)   Overall Financial Resource Strain (CARDIA)    Difficulty of Paying Living Expenses: Not very hard  Food Insecurity: No Food Insecurity (12/25/2021)   Hunger Vital Sign    Worried About Running Out of Food in the Last Year: Never true    Ran Out of Food in the Last Year: Never true  Transportation Needs: No Transportation Needs (01/22/2022)   PRAPARE - Hydrologist (Medical): No    Lack of Transportation (Non-Medical): No  Physical Activity: Inactive (01/22/2022)   Exercise Vital Sign    Days of Exercise per Week: 0 days    Minutes of Exercise per Session: 0 min  Stress: Stress Concern Present (01/22/2022)   [employer]  of Occupational Health - Occupational Stress Questionnaire    Feeling of Stress : To some extent  Social Connections: Socially Integrated (01/22/2022)   Social Connection and Isolation Panel [NHANES]    Frequency of Communication with Friends and Family: More than three times a week    Frequency of Social Gatherings with Friends and Family: Twice a week    Attends Religious Services: 1 to 4 times per year    Active Member of [employer] or Organizations: Yes    Attends Archivist Meetings: Never    Marital Status: Married  Human resources officer Violence: Not At Risk (01/22/2022)   Humiliation, Afraid, Rape, and Kick questionnaire    Fear of Current or Ex-Partner: No    Emotionally  Abused: No    Physically Abused: No    Sexually Abused: No    FAMILY HISTORY: Family History  Problem Relation Age of Onset   Cancer Maternal Aunt    Prostate cancer Neg Hx    Kidney disease Neg Hx    Kidney cancer Neg Hx    Bladder Cancer Neg Hx     ALLERGIES:  has No Known Allergies.  MEDICATIONS:  Current Outpatient Medications  Medication Sig Dispense Refill   amLODipine (NORVASC) 10 MG tablet Take 10 mg by mouth daily.     calcium-vitamin D (OSCAL WITH D) 500-5 MG-MCG tablet Take 2 tablets by mouth daily. 60 tablet 2   levothyroxine (SYNTHROID) 200 MCG tablet Take 200 mcg by mouth daily.     metFORMIN (GLUCOPHAGE) 500 MG tablet Take 500 mg by mouth 2 (two) times daily with a meal.     oxyCODONE-acetaminophen (PERCOCET) 10-325 MG tablet Take 1 tablet by mouth every 8 (eight) hours as needed. 90 tablet 0   potassium chloride (KLOR-CON M) 10 MEQ tablet Take 10 mEq by mouth 2 (two) times daily.     rosuvastatin (CRESTOR) 10 MG tablet Take 1 tablet by mouth once daily 90 tablet 0   tamsulosin (FLOMAX) 0.4 MG CAPS capsule Take 1 capsule (0.4 mg total) by mouth daily. 90 capsule 3   valsartan-hydrochlorothiazide (DIOVAN-HCT) 320-25 MG tablet Take 1 tablet by mouth daily. 90 tablet 3   zanubrutinib (BRUKINSA) 80 MG capsule Take 2 capsules (160 mg total) by mouth 2 (two) times daily. 120 capsule 0   Aspirin-Salicylamide-Caffeine (ARTHRITIS STRENGTH BC POWDER PO) Take by mouth. (Patient not taking: Reported on 02/05/2022)     No current facility-administered medications for this visit.    Review of Systems  Constitutional:  Positive for fatigue. Negative for appetite change, chills, fever and unexpected weight change.  HENT:   Negative for hearing loss and voice change.   Eyes:  Negative for eye problems and icterus.  Respiratory:  Negative for chest tightness, cough and shortness of breath.   Cardiovascular:  Negative for chest pain and leg swelling.  Gastrointestinal:  Negative  for abdominal distention and abdominal pain.  Endocrine: Negative for hot flashes.  Genitourinary:  Negative for difficulty urinating, dysuria and frequency.   Musculoskeletal:  Positive for arthralgias.  Skin:  Negative for itching and rash.  Neurological:  Negative for light-headedness and numbness.  Hematological:  Negative for adenopathy. Does not bruise/bleed easily.  Psychiatric/Behavioral:  Negative for confusion.      PHYSICAL EXAMINATION: ECOG PERFORMANCE STATUS: 1 - Symptomatic but completely ambulatory  Vitals:   02/05/22 1134  BP: (!) 162/85  Pulse: 62  Resp: 18  Temp: (!) 96.8 F (36 C)   Filed Weights  02/05/22 1134  Weight: 224 lb 3.2 oz (101.7 kg)    Physical Exam Constitutional:      General: He is not in acute distress.    Appearance: He is not diaphoretic.  HENT:     Head: Normocephalic and atraumatic.     Nose: Nose normal.     Mouth/Throat:     Pharynx: No oropharyngeal exudate.  Eyes:     General: No scleral icterus.    Pupils: Pupils are equal, round, and reactive to light.  Cardiovascular:     Rate and Rhythm: Normal rate.     Heart sounds: No murmur heard. Pulmonary:     Effort: Pulmonary effort is normal. No respiratory distress.     Breath sounds: No wheezing.  Abdominal:     General: There is no distension.     Palpations: Abdomen is soft.     Tenderness: There is no abdominal tenderness.  Musculoskeletal:     Cervical back: Normal range of motion and neck supple.  Skin:    General: Skin is warm and dry.     Findings: No erythema.  Neurological:     Mental Status: He is alert and oriented to person, place, and time. Mental status is at baseline.     Cranial Nerves: No cranial nerve deficit.     Motor: No abnormal muscle tone.  Psychiatric:        Mood and Affect: Mood and affect normal.      LABORATORY DATA:  I have reviewed the data as listed    Latest Ref Rng & Units 02/05/2022   10:50 AM 01/20/2022    1:25 PM 01/01/2022     9:03 AM  CBC  WBC 4.0 - 10.5 K/uL 19.6  29.1  33.5   Hemoglobin 13.0 - 17.0 g/dL 10.5  10.7  10.4   Hematocrit 39.0 - 52.0 % 32.8  33.3  32.7   Platelets 150 - 400 K/uL 136  161  576       Latest Ref Rng & Units 02/05/2022   10:50 AM 01/20/2022    1:25 PM 01/01/2022    9:03 AM  CMP  Glucose 70 - 99 mg/dL 108  109  110   BUN 8 - 23 mg/dL '14  17  15   '$ Creatinine 0.61 - 1.24 mg/dL 1.16  1.05  1.05   Sodium 135 - 145 mmol/L 138  139  139   Potassium 3.5 - 5.1 mmol/L 3.8  4.0  4.1   Chloride 98 - 111 mmol/L 105  106  104   CO2 22 - 32 mmol/L '26  26  26   '$ Calcium 8.9 - 10.3 mg/dL 8.5  8.7  8.7   Total Protein 6.5 - 8.1 g/dL 6.7  7.1    Total Bilirubin 0.3 - 1.2 mg/dL 0.7  0.6    Alkaline Phos 38 - 126 U/L 62  71    AST 15 - 41 U/L 29  35    ALT 0 - 44 U/L 17  22       RADIOGRAPHIC STUDIES: I have personally reviewed the radiological images as listed and agreed with the findings in the report. No results found.

## 2022-02-06 NOTE — Assessment & Plan Note (Signed)
Continue treatment as mentioned above.  

## 2022-02-09 ENCOUNTER — Other Ambulatory Visit: Payer: Self-pay

## 2022-02-11 ENCOUNTER — Other Ambulatory Visit (HOSPITAL_COMMUNITY): Payer: Self-pay

## 2022-02-11 ENCOUNTER — Other Ambulatory Visit: Payer: Self-pay | Admitting: Oncology

## 2022-02-11 DIAGNOSIS — C9110 Chronic lymphocytic leukemia of B-cell type not having achieved remission: Secondary | ICD-10-CM

## 2022-02-12 ENCOUNTER — Other Ambulatory Visit (HOSPITAL_COMMUNITY): Payer: Self-pay

## 2022-02-12 MED ORDER — BRUKINSA 80 MG PO CAPS
160.0000 mg | ORAL_CAPSULE | Freq: Two times a day (BID) | ORAL | 0 refills | Status: DC
  Filled 2022-02-12: qty 120, 30d supply, fill #0

## 2022-02-12 NOTE — Telephone Encounter (Signed)
CBC with Differential/Platelet Order: [id number] Status: Final result     Visible to patient: Yes (seen)     Next appt: 02/25/2022 at 11:00 AM in Pain Medicine Gillis Santa, MD)     Dx: Normocytic anemia; CLL (chronic lymph...   0 Result Notes            Component Ref Range & Units 7 d ago (02/05/22) 3 wk ago (01/20/22) 1 mo ago (01/01/22) 1 mo ago (12/26/21) 1 mo ago (12/25/21) 7 mo ago (07/06/21) 6 yr ago (10/22/15) 6 yr ago (10/22/15)  WBC 4.0 - 10.5 K/uL 19.6 High  29.1 High  33.5 High  29.7 High  30.4 High  15.9 High  R  12.9 High  R  RBC 4.22 - 5.81 MIL/uL 3.70 Low  3.79 Low  3.72 Low  3.60 Low  3.43 Low  3.96 Low  R  4.10 Low  R  Hemoglobin 13.0 - 17.0 g/dL 10.5 Low  10.7 Low  10.4 Low  10.1 Low  9.6 Low  10.3 Low  R  11.6 Low  R  HCT 39.0 - 52.0 % 32.8 Low  33.3 Low  32.7 Low  31.4 Low  30.5 Low  33.4 Low  R  34.7 Low  R  MCV 80.0 - 100.0 fL 88.6 87.9 87.9 87.2 88.9 84.3  84.5  MCH 26.0 - 34.0 pg 28.4 28.2 28.0 28.1 28.0 26.0 Low  R  28.2  MCHC 30.0 - 36.0 g/dL 32.0 32.1 31.8 32.2 31.5 30.8 Low  R  33.4 R  RDW 11.5 - 15.5 % 18.6 High  18.0 High  17.3 High  17.3 High  17.3 High  17.7 High  R  18.5 High  R  Platelets 150 - 400 K/uL 136 Low  161 576 High  229 195 174 R  155 R  nRBC 0.0 - 0.2 % 0.0 0.0 0.0 0.0 CM 0.0     Neutrophils Relative % % '14 16 18  29 '$ 18.5 63   Neutro Abs 1.7 - 7.7 K/uL 2.7 4.7 5.9  8.8 High  2,942 R 8.2 High  R   Lymphocytes Relative % 82 71 69  62  28   Lymphs Abs 0.7 - 4.0 K/uL 16.1 High  20.4 High  23.1 High   18.7 High  9,953 High  R 3.6 R   Monocytes Relative % '3 12 12  9 '$ 16.3 8   Monocytes Absolute 0.1 - 1.0 K/uL 0.6 3.6 High  4.0 High   2.6 High   1.1 High  R   Eosinophils Relative % '1 1 1  '$ 0 2.2 0   Eosinophils Absolute 0.0 - 0.5 K/uL 0.2 0.3 0.4  0.1 350 R 0.0 R   Basophils Relative % 0 0 0  0 0.4 1   Basophils Absolute 0.0 - 0.1 K/uL 0.1 0.1 0.1  0.1 64 R 0.1 R   Immature Granulocytes % 0 0 0  0     Abs Immature Granulocytes 0.00 - 0.07 K/uL  0.02 0.08 High  0.09 High  CM  0.10 High      Comment: Performed at [facility], [address] [place], [place] [address]  WBC Morphology   DIFF. CONFIRMED BY SMEAR ABSOLUTE LYMPHOCYTOSIS CM       RBC Morphology   MORPHOLOGY UNREMARKABLE MORPHOLOGY UNREMARKABLE  MORPHOLOGY UNREMARKABLE     Smear Review   Normal platelet morphology CM PLATELETS APPEAR INCREASED CM  Normal platelet morphology CM  Smudge Cells   PRESENT CM   PRESENT CM     Reactive, Benign Lymphocytes      PRESENT     MPV       11.1 R    Absolute Monocytes       2,592 High  R    Total Lymphocyte       62.6 R    Resulting Agency  [facility] [place] [facility] [place] [facility]         Specimen Collected: 02/05/22 10:50 Last Resulted: 02/05/22 11:01      Lab Flowsheet      Order Details      View Encounter      Lab and Collection Details      Routing      Result History    View All Conversations on this Encounter      CM=Additional comments  R=Reference range differs from displayed range      Result Care Coordination   Patient Communication   Add Comments   Seen Back to Top      Other Results from 02/05/2022   Contains abnormal data Comprehensive metabolic panel Order: [id number] Status: Final result      Visible to patient: Yes (not seen)      Next appt: 02/25/2022 at 11:00 AM in Pain Medicine Gillis Santa, MD)      Dx: Normocytic anemia; CLL (chronic lymph...    0 Result Notes      1 HM Topic             Component Ref Range & Units 7 d ago (02/05/22) 3 wk ago (01/20/22) 1 mo ago (01/01/22) 1 mo ago (12/26/21) 1 mo ago (12/25/21) 7 mo ago (07/06/21) 6 yr ago (10/22/15)  Sodium 135 - 145 mmol/L 138 139 139 139 140 141 R 138  Potassium 3.5 - 5.1 mmol/L 3.8 4.0 4.1 4.1 3.6 4.1 R 3.3 Low   Chloride 98 - 111 mmol/L 105 106 104 108 105 107 R 107 R  CO2 22 - 32 mmol/L '26 26 26 26 27 25 '$ R 27  Glucose, Bld 70 - 99 mg/dL  108 High  109 High  CM 110 High  CM 299 High  CM 188 High  CM 136 High  R, CM 125 High  R  Comment: Glucose reference range applies only to samples taken after fasting for at least 8 hours.  BUN 8 - 23 mg/dL '14 17 15 19 18 19 '$ R 16 R  Creatinine, Ser 0.61 - 1.24 mg/dL 1.16 1.05 1.05 1.22 1.35 High  1.08 R 1.25 High   Calcium 8.9 - 10.3 mg/dL 8.5 Low  8.7 Low  8.7 Low  8.3 Low  8.6 Low  9.1 R 8.8 Low   Total Protein 6.5 - 8.1 g/dL 6.7 7.1    6.4 R 7.3  Albumin 3.5 - 5.0 g/dL 3.8 3.8     3.8  AST 15 - 41 U/L 29 35    37 High  R 47 High   ALT 0 - 44 U/L '17 22    26 '$ R 31 R  Alkaline Phosphatase 38 - 126 U/L 62 71     73  Total Bilirubin 0.3 - 1.2 mg/dL 0.7 0.6    0.7 R 1.5 High   GFR, Estimated >60 mL/min >60 >60 CM >60 CM >60 CM 55 Low  CM  Comment: (NOTE) Calculated using the CKD-EPI Creatinine Equation (2021)  Anion gap 5 - '15 7 7 '$ CM 9 CM 5 CM 8 CM  4 Low   Comment: Performed at [facility], [address] [place], [place] [address]  Resulting Agency  [facility] [place] [facility] [place] [facility] [place] [facility] [place] [facility]         Specimen Collected: 02/05/22 10:50 Last Resulted: 02/05/22 11:13

## 2022-02-16 ENCOUNTER — Inpatient Hospital Stay: Payer: No Typology Code available for payment source | Admitting: Hospice and Palliative Medicine

## 2022-02-16 ENCOUNTER — Encounter: Payer: Self-pay | Admitting: Hospice and Palliative Medicine

## 2022-02-16 ENCOUNTER — Other Ambulatory Visit: Payer: Self-pay

## 2022-02-16 ENCOUNTER — Telehealth: Payer: Self-pay | Admitting: *Deleted

## 2022-02-16 VITALS — BP 149/95 | HR 68 | Temp 96.2°F | Resp 18

## 2022-02-16 DIAGNOSIS — R21 Rash and other nonspecific skin eruption: Secondary | ICD-10-CM

## 2022-02-16 DIAGNOSIS — C9110 Chronic lymphocytic leukemia of B-cell type not having achieved remission: Secondary | ICD-10-CM | POA: Diagnosis not present

## 2022-02-16 MED ORDER — MUPIROCIN 2 % EX OINT: 1.0000 | TOPICAL_OINTMENT | Freq: Two times a day (BID) | CUTANEOUS | 0 refills | Status: DC

## 2022-02-16 NOTE — Telephone Encounter (Signed)
2nd attempt to reach patient - Spoke with pt's daughter- she was not "aware that he was having any problems." The phone number that I was asked to call was the daughter's primary cell number. Daughter was at work and was not aware pt called our office. She asked me to reach out to pt's home #  Pt is now on his way to [facility]

## 2022-02-16 NOTE — Telephone Encounter (Signed)
Patient called reporting that he has developed a rash  on his inner left upper leg, in his groin and scrotal area on left side. States it is itchy and painful for a couple of two weeks, but he was putting peroxide and oatmeal soap on  it and it almost went away, but came back a couple of days ago He has been scratching the area and has a raw looking appearance the rash is bumpy. He has been on his chemotherapy medicine for about a month now. Please advise

## 2022-02-16 NOTE — Progress Notes (Signed)
Symptom Management [place] at [facility] [phone] [phone]  Patient Care Team: Center, [facility] as PCP - General (General Practice) Earlie Server, MD as Consulting Physician (Oncology)   NAME OF PATIENT: Paul Briggs  [id number]  1945/02/03   DATE OF VISIT: 02/16/22  REASON FOR CONSULT: Paul Deerman [name] is a 77 y.o. 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  He is currently on treatment with twice daily Zanubrutinib.   INTERVAL HISTORY: Patient last saw Dr. Tasia Catchings on 02/05/2022 with plan to continue treatment with Zanubrutinib.   Today, patient was seen in [facility] for evaluation of a rash.  Patient reports that he has had an intermittent rash in the left groin/left side of penis for about a month.  He was applying peroxide and "jock itch" and said the rash resolved but then returned.  He denies any pain but says that it is intensely pruritic.  He denies any penile drainage or discharge.  Denies any history of rashes or STDs.  No new creams or lotions.  Of note, he did start oral chemo approximately 1 month ago.  Denies any neurologic complaints. Denies recent fevers or illnesses. Denies any easy bleeding or bruising. Reports good appetite and denies weight loss. Denies chest pain. Denies any nausea, vomiting, constipation, or diarrhea. Denies urinary complaints. Patient offers no further specific complaints today.   PAST MEDICAL HISTORY: Past Medical History:  Diagnosis Date   Acute prostatitis    BPH (benign prostatic hyperplasia)    Chronic kidney disease    Dysplasia of prostate    Erectile dysfunction    HTN (hypertension)    Hypogonadism in male    Orchitis and epididymitis    Over weight    Palindromic rheumatism, hand    Prostatitis    Rectum pain    Testicle swelling    Testicle tenderness    Testicular mass     PAST SURGICAL HISTORY:   Past Surgical History:  Procedure Laterality Date   BACK SURGERY     head surgery     from fall  drilled hole in brain to relieve pressure    HEMATOLOGY/ONCOLOGY HISTORY:  Oncology History  CLL (chronic lymphocytic leukemia) ([place])  12/25/2021 - 12/26/2021 Hospital Admission   Patient was admitted due to pain and swelling of right ankle, right knee and left wrist.  Patient was found to have WBC 30.4, with neutrophil 29% and lymphocyte 2%.  Peripheral smear showed leukocytosis with slight left shift in myeloid series and lymphocytosis with abnormal morphology.  Uric acid 5.5, worsening renal function with creatinine 1.35, BUN 18, GFR 55 (baseline creatinine 1.08 on 07/06/2021), lactic acid 1.1.  ESR normal, uric acid 5.5 normal, CRP 22.2, LDH 182  Orthopedic surgery was consulted s/p arthrocentesis, cell count 296 with 72% neutrophil, no crystal. Fluid negative for growth.  Patient received cefepime and vancomycin while in the hospital, Patient was discharged on Keflex for 5 days.   12/28/2021 Cancer Staging   Staging form: Chronic Lymphocytic Leukemia / Small Lymphocytic Lymphoma, AJCC 8th Edition - Clinical stage from 12/28/2021: Modified Rai Stage III (Modified Rai risk: High, Lymphocytosis: Present, Adenopathy: Present, Organomegaly: Absent, Anemia: Present, Thrombocytopenia: Absent) - Signed by Earlie Server, MD on 01/20/2022 Stage prefix: Initial diagnosis Hemoglobin (Hgb) (g/dL): 10.1   01/01/2022 Initial Diagnosis   CLL (chronic lymphocytic leukemia)   12/27/21 peripheral blood flowcytometry showed Involvement by CD5+, CD23+, CD20+, CD22+ clonal B  cell population, phenotype typical for 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    01/05/2022 Imaging   CT chest abdomen pelvis wo contrast 1. Multiple prominent borderline enlarged  and mildly enlarged lymph nodes, most evident in the low anatomic pelvis, as above, compatible with reported clinical history of CLL. 2. There also several small pulmonary nodules in the lungs measuring 5 mm or less in size. This is nonspecific, but statistically likely benign. No follow-up needed if patient is low-risk (and has no known or suspected primary neoplasm). Non-contrast chest CT can be considered in 12 months if patient is high-risk. This recommendation 3. Aortic atherosclerosis, in addition to left main and 2 vessel coronary artery disease. Please note that although the presence of coronary artery calcium documents the presence of coronary artery disease, the severity of this disease and any potential stenosis cannot be assessed on this non-gated CT examination. Assessment for  potential risk factor modification, dietary therapy or pharmacologic therapy may be warranted, if clinically indicated. 4. There are calcifications of the aortic valve. Echocardiographic correlation for evaluation of potential valvular dysfunction may be warranted if clinically indicated. 5. 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 6. Diverticulosis without evidence of acute diverticulitis at this time. 7. Mild cardiomegaly.     ALLERGIES:  has No Known Allergies.  MEDICATIONS:  Current Outpatient Medications  Medication Sig Dispense Refill   amLODipine (NORVASC) 10 MG tablet Take 10 mg by mouth daily.     calcium-vitamin D (OSCAL WITH D) 500-5 MG-MCG tablet Take 2 tablets by mouth daily. 60 tablet 2   levothyroxine (SYNTHROID) 200 MCG tablet Take 200 mcg by mouth daily.     metFORMIN (GLUCOPHAGE) 500 MG tablet Take 500 mg by mouth 2 (two) times daily with a meal.     oxyCODONE-acetaminophen (PERCOCET) 10-325 MG tablet Take 1 tablet by mouth every 8 (eight) hours as needed. 90 tablet 0   potassium chloride (KLOR-CON M) 10 MEQ tablet Take 10 mEq by mouth 2 (two) times daily.     rosuvastatin  (CRESTOR) 10 MG tablet Take 1 tablet by mouth once daily 90 tablet 0   tamsulosin (FLOMAX) 0.4 MG CAPS capsule Take 1 capsule (0.4 mg total) by mouth daily. 90 capsule 3   valsartan-hydrochlorothiazide (DIOVAN-HCT) 320-25 MG tablet Take 1 tablet by mouth daily. 90 tablet 3   zanubrutinib (BRUKINSA) 80 MG capsule Take 2 capsules (160 mg total) by mouth 2 (two) times daily. 120 capsule 0   Aspirin-Salicylamide-Caffeine (ARTHRITIS STRENGTH BC POWDER PO) Take by mouth. (Patient not taking: Reported on 02/05/2022)     No current facility-administered medications for this visit.    VITAL SIGNS: BP (!) 149/95   Pulse 68   Temp (!) 96.2 F (35.7 C) (Tympanic)   Resp 18  There were no vitals filed for this visit.  Estimated body mass index is 29.58 kg/m as calculated from the following:   Height as of 12/25/21: '6\' 1"'$  (1.854 m).   Weight as of 02/05/22: 224 lb 3.2 oz (101.7 kg).  LABS: CBC:    Component Value Date/Time   WBC 19.6 (H) 02/05/2022 1050   HGB 10.5 (L) 02/05/2022 1050   HGB 11.8 (L) 10/06/2013 1441   HCT 32.8 (L) 02/05/2022 1050   HCT 38.9 09/18/2014 1000   PLT 136 (L) 02/05/2022 1050   PLT 295 10/06/2013 1441  MCV 88.6 02/05/2022 1050   MCV 92 10/06/2013 1441   NEUTROABS 2.7 02/05/2022 1050   NEUTROABS 3.3 10/06/2013 1441   LYMPHSABS 16.1 (H) 02/05/2022 1050   LYMPHSABS 2.3 10/06/2013 1441   MONOABS 0.6 02/05/2022 1050   MONOABS 0.4 10/06/2013 1441   EOSABS 0.2 02/05/2022 1050   EOSABS 0.4 10/06/2013 1441   BASOSABS 0.1 02/05/2022 1050   BASOSABS 0.1 10/06/2013 1441   Comprehensive Metabolic Panel:    Component Value Date/Time   NA 138 02/05/2022 1050   NA 139 10/06/2013 1441   K 3.8 02/05/2022 1050   K 4.1 10/06/2013 1441   CL 105 02/05/2022 1050   CL 102 10/06/2013 1441   CO2 26 02/05/2022 1050   CO2 28 10/06/2013 1441   BUN 14 02/05/2022 1050   BUN 27 (H) 10/06/2013 1441   CREATININE 1.16 02/05/2022 1050   CREATININE 1.08 07/06/2021 1030   GLUCOSE 108  (H) 02/05/2022 1050   GLUCOSE 206 (H) 10/06/2013 1441   CALCIUM 8.5 (L) 02/05/2022 1050   CALCIUM 9.1 10/06/2013 1441   AST 29 02/05/2022 1050   AST 32 10/06/2013 1441   ALT 17 02/05/2022 1050   ALT 22 10/06/2013 1441   ALKPHOS 62 02/05/2022 1050   ALKPHOS 70 10/06/2013 1441   BILITOT 0.7 02/05/2022 1050   BILITOT 0.8 10/06/2013 1441   PROT 6.7 02/05/2022 1050   PROT 7.9 10/06/2013 1441   ALBUMIN 3.8 02/05/2022 1050   ALBUMIN 3.3 (L) 10/06/2013 1441    RADIOGRAPHIC STUDIES: No results found.  PERFORMANCE STATUS (ECOG) : 0 - Asymptomatic  Review of Systems Unless otherwise noted, a complete review of systems is negative.  Physical Exam General: NAD Cardiovascular: regular rate and rhythm Pulmonary: clear ant fields Abdomen: soft, nontender, + bowel sounds GU: no suprapubic tenderness Extremities: no edema, no joint deformities Skin: Scattered lesions to the left groin/penis Neurological: Grossly nonfocal  Exam was chaperoned by Eric Form, RN.  IMPRESSION/PLAN: Rash -unclear etiology.  Timing is coincidental for start of Zanubrutinib, which is known to cause pruritic rashes/skin infections.  No clear evidence of STDs, although will add RPR to next labs.  Patient recently was tested for HIV during his hospitalization.  Discussed with Dr. Tasia Catchings and will start patient on Bactroban.     Patient expressed understanding and was in agreement with this plan. He also understands that He can call clinic at any time with any questions, concerns, or complaints.   Thank you for allowing me to participate in the care of this very pleasant patient.   Time Total: 15 minutes  Visit consisted of counseling and education dealing with the complex and emotionally intense issues of symptom management in the setting of serious illness.Greater than 50%  of this time was spent counseling and coordinating care related to the above assessment and plan.  Signed by: Altha Harm, PhD,  NP-C

## 2022-02-17 ENCOUNTER — Other Ambulatory Visit: Payer: Self-pay

## 2022-02-17 ENCOUNTER — Other Ambulatory Visit (HOSPITAL_COMMUNITY): Payer: Self-pay

## 2022-02-25 ENCOUNTER — Encounter: Payer: Self-pay | Admitting: Student in an Organized Health Care Education/Training Program

## 2022-02-25 ENCOUNTER — Ambulatory Visit
Payer: No Typology Code available for payment source | Attending: Student in an Organized Health Care Education/Training Program | Admitting: Student in an Organized Health Care Education/Training Program

## 2022-02-25 VITALS — BP 135/86 | HR 66 | Temp 98.0°F | Resp 16 | Ht 73.0 in | Wt 228.0 lb

## 2022-02-25 DIAGNOSIS — M542 Cervicalgia: Secondary | ICD-10-CM | POA: Diagnosis not present

## 2022-02-25 DIAGNOSIS — M5412 Radiculopathy, cervical region: Secondary | ICD-10-CM

## 2022-02-25 DIAGNOSIS — M75102 Unspecified rotator cuff tear or rupture of left shoulder, not specified as traumatic: Secondary | ICD-10-CM

## 2022-02-25 DIAGNOSIS — M19011 Primary osteoarthritis, right shoulder: Secondary | ICD-10-CM | POA: Insufficient documentation

## 2022-02-25 DIAGNOSIS — M19012 Primary osteoarthritis, left shoulder: Secondary | ICD-10-CM | POA: Diagnosis not present

## 2022-02-25 DIAGNOSIS — G894 Chronic pain syndrome: Secondary | ICD-10-CM | POA: Insufficient documentation

## 2022-02-25 DIAGNOSIS — M25512 Pain in left shoulder: Secondary | ICD-10-CM | POA: Insufficient documentation

## 2022-02-25 DIAGNOSIS — Z79891 Long term (current) use of opiate analgesic: Secondary | ICD-10-CM

## 2022-02-25 DIAGNOSIS — Z0289 Encounter for other administrative examinations: Secondary | ICD-10-CM | POA: Insufficient documentation

## 2022-02-25 DIAGNOSIS — M12812 Other specific arthropathies, not elsewhere classified, left shoulder: Secondary | ICD-10-CM

## 2022-02-25 DIAGNOSIS — M25511 Pain in right shoulder: Secondary | ICD-10-CM

## 2022-02-25 DIAGNOSIS — G8929 Other chronic pain: Secondary | ICD-10-CM | POA: Insufficient documentation

## 2022-02-25 MED ORDER — OXYCODONE-ACETAMINOPHEN 10-325 MG PO TABS: 1.0000 | ORAL_TABLET | Freq: Three times a day (TID) | ORAL | 0 refills | Status: DC | PRN

## 2022-02-25 MED ORDER — PREGABALIN 75 MG PO CAPS: 75.0000 mg | ORAL_CAPSULE | Freq: Two times a day (BID) | ORAL | 2 refills | Status: DC

## 2022-02-25 NOTE — Progress Notes (Signed)
PROVIDER NOTE: Information contained herein reflects review and annotations entered in association with encounter. Interpretation of such information and data should be left to medically-trained personnel. Information provided to patient can be located elsewhere in the medical record under "Patient Instructions". Document created using STT-dictation technology, any transcriptional errors that may result from process are unintentional.    Patient: Paul Ramus [name]  Service Category: E/M  Provider: Gillis Santa, MD  DOB: [birth year]  DOS: 02/25/2022  Specialty: Interventional Pain Management  MRN: [id number]  Setting: Ambulatory outpatient  [facility]P: [facility] [place]  Type: Established Patient    Referring Provider: Cletis Athens, MD  Location: Office  Delivery: Face-to-face     HPI  [name], a 77 y.o. year old male, is here today because of his Left rotator cuff tear arthropathy [M75.102, M12.812]. [name] primary complain today is Back Pain (lower) and Shoulder Pain (bilateral) Last encounter: My last encounter with him was on 12/01/21 Pertinent problems: [name] has Chronic back pain; Disorder of rotator cuff, left; Primary osteoarthritis of left shoulder; Left rotator cuff tear arthropathy; Spinal stenosis, lumbar region, with neurogenic claudication; Chronic left shoulder pain; Chronic pain syndrome; and Pain management contract signed on their pertinent problem list. Pain Assessment: Severity of Chronic pain is reported as a 5 /10. Location: Back Lower/ . Onset: More than a month ago. Quality: Dull. Timing: Constant. Modifying factor(s): rest, Oxy/Acet. Vitals:  height is '6\' 1"'$  (1.854 m) and weight is 228 lb (103.4 kg). His temporal temperature is 98 F (36.7 C). His blood pressure is 135/86 and his pulse is 66. His respiration is 16 and oxygen saturation is 100%.   Reason for encounter: medication management, bilateral shoulder pain, cervical spine  pain with radiation into bilateral hands and fingers, right greater than left  Chronic low back and leg pain related to lumbar spinal stenosis and neurogenic claudication.  Continues oxycodone 10 mg 3 times a day as needed.  He continues to work in [employer]. He unfortunately, he was also diagnosed with chronic lymphocytic leukemia and is undergoing treatment with Dr. Tasia Catchings He is complaining of bilateral shoulder pain.  He is status post left shoulder steroid injection approximately 15 months ago.  We discussed repeating for his bilateral shoulders He is also endorsing neck pain with radiation into bilateral hands with associated numbness and tingling in a dermatomal fashion.  Recommend cervical MRI for further evaluation I also recommend that he restart Lyrica to see if that has an impact on his cervical radicular pain.  He states that he has tried gabapentin in the past with limited response.   Pharmacotherapy Assessment  Analgesic: Percocet 10 mg 3 times daily as needed, quantity 90/month   Monitoring: [place] PMP: PDMP reviewed during this encounter.       Pharmacotherapy: No side-effects or adverse reactions reported. Compliance: No problems identified. Effectiveness: Clinically acceptable.  [name], RN  02/25/2022 10:56 AM  Sign when Signing Visit Nursing Pain Medication Assessment:  Safety precautions to be maintained throughout the outpatient stay will include: orient to surroundings, keep bed in low position, maintain call bell within reach at all times, provide assistance with transfer out of bed and ambulation.  Medication Inspection Compliance: Pill count conducted under aseptic conditions, in front of the patient. Neither the pills nor the bottle was removed from the patient's sight at any time. Once count was completed pills were immediately returned to the patient in their original bottle.  Medication: Oxycodone/APAP Pill/Patch  Count:  0 of 90 pills remain Pill/Patch  Appearance: Markings consistent with prescribed medication Bottle Appearance: Standard pharmacy container. Clearly labeled. Filled Date: 01 / 06 / 2024 Last Medication intake:  YesterdaySafety precautions to be maintained throughout the outpatient stay will include: orient to surroundings, keep bed in low position, maintain call bell within reach at all times, provide assistance with transfer out of bed and ambulation.     UDS:  Summary  Date Value Ref Range Status  12/01/2021 Note  Final    Comment:    ==================================================================== [facility] (MW) ==================================================================== Test                             Result       Flag       Units  Drug Present and Declared for Prescription Verification   Oxycodone                      998          EXPECTED   ng/mg creat   Oxymorphone                    739          EXPECTED   ng/mg creat   Noroxymorphone                 322          EXPECTED   ng/mg creat    Sources of oxycodone include scheduled prescription medications.    Oxymorphone and noroxymorphone are expected metabolites of oxycodone.    Oxymorphone is also available as a scheduled prescription    medication.  ==================================================================== Test                      Result    Flag   Units      Ref Range   Creatinine              220              mg/dL      >=20 ==================================================================== Declared Medications:  The flagging and interpretation on this report are based on the  following declared medications.  Unexpected results may arise from  inaccuracies in the declared medications.   **Note: The testing scope of this panel includes these medications:   Oxycodone (Percocet)   **Note: The testing scope of this panel does not include the  following reported medications:   Acetaminophen (Percocet)  Aspirin   Bisacodyl  Caffeine  Hydrochlorothiazide  Levothyroxine  Rosuvastatin  Supplement  Tamsulosin  Valsartan ==================================================================== For clinical consultation, please call [phone]. ====================================================================      ROS  Constitutional: Denies any fever or chills Gastrointestinal: No reported hemesis, hematochezia, vomiting, or acute GI distress Musculoskeletal:  Cervical spine pain with radiation into bilateral hands, right greater than left, bilateral shoulder pain left greater than right low back and bilateral leg pain Neurological: No reported episodes of acute onset apraxia, aphasia, dysarthria, agnosia, amnesia, paralysis, loss of coordination, or loss of consciousness  Medication Review  amLODipine, calcium-vitamin D, levothyroxine, metFORMIN, mupirocin ointment, oxyCODONE-acetaminophen, potassium chloride, pregabalin, rosuvastatin, tamsulosin, valsartan-hydrochlorothiazide, and zanubrutinib  History Review  Allergy: Mr. Minihan has No Known Allergies. Drug: Mr. Stanbery  reports no history of drug use. Alcohol:  reports no history of alcohol use. Tobacco:  reports that he has quit smoking. He has never  used smokeless tobacco. Social: Mr. Bourbon  reports that he has quit smoking. He has never used smokeless tobacco. He reports that he does not drink alcohol and does not use drugs. Medical:  has a past medical history of Acute prostatitis, BPH (benign prostatic hyperplasia), Chronic kidney disease, Dysplasia of prostate, Erectile dysfunction, HTN (hypertension), Hypogonadism in male, Orchitis and epididymitis, Over weight, Palindromic rheumatism, hand, Prostatitis, Rectum pain, Testicle swelling, Testicle tenderness, and Testicular mass. Surgical: Mr. Detter  has a past surgical history that includes Back surgery and head surgery. Family: family history includes Cancer in his maternal  aunt.  Laboratory Chemistry Profile   Renal Lab Results  Component Value Date   BUN 14 02/05/2022   CREATININE 1.16 37/48/2707   BCR NOT APPLICABLE 86/75/4492   GFRAA >60 10/22/2015   GFRNONAA >60 02/05/2022    Hepatic Lab Results  Component Value Date   AST 29 02/05/2022   ALT 17 02/05/2022   ALBUMIN 3.8 02/05/2022   ALKPHOS 62 02/05/2022   HCVAB NON REACTIVE 12/26/2021   LIPASE 357 02/25/2012    Electrolytes Lab Results  Component Value Date   NA 138 02/05/2022   K 3.8 02/05/2022   CL 105 02/05/2022   CALCIUM 8.5 (L) 02/05/2022    Bone Lab Results  Component Value Date   TESTOSTERONE 429 04/19/2017    Inflammation (CRP: Acute Phase) (ESR: Chronic Phase) Lab Results  Component Value Date   CRP 22.2 (H) 12/25/2021   ESRSEDRATE 3 12/25/2021   LATICACIDVEN 2.1 ([place]) 12/25/2021         Note: Above Lab results reviewed.   Physical Exam  General appearance: Well nourished, well developed, and well hydrated. In no apparent acute distress Mental status: Alert, oriented x 3 (person, place, & time)       Respiratory: No evidence of acute respiratory distress Eyes: PERLA Vitals: BP 135/86   Pulse 66   Temp 98 F (36.7 C) (Temporal)   Resp 16   Ht '6\' 1"'$  (1.854 m)   Wt 228 lb (103.4 kg)   SpO2 100%   BMI 30.08 kg/m  BMI: Estimated body mass index is 30.08 kg/m as calculated from the following:   Height as of this encounter: '6\' 1"'$  (1.854 m).   Weight as of this encounter: 228 lb (103.4 kg). Ideal: Ideal body weight: 79.9 kg (176 lb 2.4 oz) Adjusted ideal body weight: 89.3 kg (196 lb 14.2 oz)  Cervical Spine Area Exam  Skin & Axial Inspection: No masses, redness, edema, swelling, or associated skin lesions Alignment: Symmetrical Functional ROM: Pain restricted ROM, bilaterally Stability: No instability detected Muscle Tone/Strength: Functionally intact. No obvious neuro-muscular anomalies detected. Sensory (Neurological): Dermatomal pain pattern Palpation:  No palpable anomalies             Upper Extremity (UE) Exam    Side: Right upper extremity  Side: Left upper extremity  Skin & Extremity Inspection: Skin color, temperature, and hair growth are WNL. No peripheral edema or cyanosis. No masses, redness, swelling, asymmetry, or associated skin lesions. No contractures.  Skin & Extremity Inspection: Skin color, temperature, and hair growth are WNL. No peripheral edema or cyanosis. No masses, redness, swelling, asymmetry, or associated skin lesions. No contractures.  Functional ROM: Pain restricted ROM for shoulder  Functional ROM: Pain restricted ROM for shoulder  Muscle Tone/Strength: Functionally intact. No obvious neuro-muscular anomalies detected.  Muscle Tone/Strength: Functionally intact. No obvious neuro-muscular anomalies detected.  Sensory (Neurological):Arthropathic arthralgia and possibly dermatomal  Sensory (Neurological): Arthropathic  arthralgia and possibly dermatomal          Palpation: No palpable anomalies              Palpation: No palpable anomalies              Provocative Test(s):  Phalen's test: deferred Tinel's test: deferred Apley's scratch test (touch opposite shoulder):  Action 1 (Across chest): Decreased ROM Action 2 (Overhead): Decreased ROM Action 3 (LB reach): Decreased ROM   Provocative Test(s):  Phalen's test: deferred Tinel's test: deferred Apley's scratch test (touch opposite shoulder):  Action 1 (Across chest): Decreased ROM Action 2 (Overhead): Decreased ROM Action 3 (LB reach): Decreased ROM      Lumbar Spine Area Exam  Skin & Axial Inspection: No masses, redness, or swelling Alignment: Symmetrical Functional ROM: Pain restricted ROM       Stability: No instability detected Muscle Tone/Strength: Functionally intact. No obvious neuro-muscular anomalies detected. Sensory (Neurological): Neurogenic pain pattern   Gait & Posture Assessment  Ambulation: Unassisted Gait: Relatively normal for age and body  habitus Posture: WNL   Lower Extremity Exam      Side: Right lower extremity   Side: Left lower extremity  Stability: No instability observed           Stability: No instability observed          Skin & Extremity Inspection: Skin color, temperature, and hair growth are WNL. No peripheral edema or cyanosis. No masses, redness, swelling, asymmetry, or associated skin lesions. No contractures.   Skin & Extremity Inspection: Skin color, temperature, and hair growth are WNL. No peripheral edema or cyanosis. No masses, redness, swelling, asymmetry, or associated skin lesions. No contractures.  Functional ROM: Pain restricted ROM for hip and knee joints           Functional ROM: Pain restricted ROM for hip and knee joints          Muscle Tone/Strength: Functionally intact. No obvious neuro-muscular anomalies detected.   Muscle Tone/Strength: Functionally intact. No obvious neuro-muscular anomalies detected.  Sensory (Neurological): Neurogenic pain pattern         Sensory (Neurological): Neurogenic pain pattern        DTR: Patellar: deferred today Achilles: deferred today Plantar: deferred today   DTR: Patellar: deferred today Achilles: deferred today Plantar: deferred today  Palpation: No palpable anomalies   Palpation: No palpable anomalies     Assessment   Diagnosis Status  1. Left rotator cuff tear arthropathy   2. Chronic right shoulder pain   3. Chronic left shoulder pain   4. Primary osteoarthritis of right shoulder   5. Primary osteoarthritis of left shoulder   6. Cervical radicular pain   7. Pain management contract signed   8. Encounter for long-term opiate analgesic use   9. Chronic pain syndrome   10. Cervicalgia     Persistent Persistent Persistent     Plan of Care    [name] has a current medication list which includes the following long-term medication(s): calcium-vitamin d, levothyroxine, potassium chloride, pregabalin, rosuvastatin, and  valsartan-hydrochlorothiazide.  Pharmacotherapy (Medications Ordered): Meds ordered this encounter  Medications   oxyCODONE-acetaminophen (PERCOCET) 10-325 MG tablet    Sig: Take 1 tablet by mouth every 8 (eight) hours as needed.    Dispense:  90 tablet    Refill:  0   oxyCODONE-acetaminophen (PERCOCET) 10-325 MG tablet    Sig: Take 1 tablet by mouth every 8 (eight) hours as needed.  Dispense:  90 tablet    Refill:  0   oxyCODONE-acetaminophen (PERCOCET) 10-325 MG tablet    Sig: Take 1 tablet by mouth every 8 (eight) hours as needed.    Dispense:  90 tablet    Refill:  0   pregabalin (LYRICA) 75 MG capsule    Sig: Take 1 capsule (75 mg total) by mouth 2 (two) times daily.    Dispense:  60 capsule    Refill:  2    Orders:  Orders Placed This Encounter  Procedures   SHOULDER INJECTION    Standing Status:   Future    Standing Expiration Date:   05/26/2022    Scheduling Instructions:     Procedure: Intra-articular shoulder (Glenohumeral) joint injection     Side: Bilateral     Level: Glenohumeral joint    Order Specific Question:   Where will this procedure be performed?    Answer:   [facility]   MR CERVICAL SPINE WO CONTRAST    Patient presents with axial pain with possible radicular component. Please assist [place] in identifying specific level(s) and laterality of any additional findings such as: 1. Facet (Zygapophyseal) joint DJD (Hypertrophy, space narrowing, subchondral sclerosis, and/or osteophyte formation) 2. DDD and/or IVDD (Loss of disc height, desiccation, gas patterns, osteophytes, endplate sclerosis, or "Black disc disease") 3. Pars defects 4. Spondylolisthesis, spondylosis, and/or spondyloarthropathies (include Degree/Grade of displacement in mm) (stability) 5. Vertebral body Fractures (acute/chronic) (state percentage of collapse) 6. Demineralization (osteopenia/osteoporotic) 7. Bone pathology 8. Foraminal narrowing  9. Surgical changes 10. Central,  Lateral Recess, and/or Foraminal Stenosis (include AP diameter of stenosis in mm) 11. Surgical changes (hardware type, status, and presence of fibrosis) 12. Modic Type Changes (MRI only) 13. IVDD (Disc bulge, protrusion, herniation, extrusion) (Level, laterality, extent)    Standing Status:   Future    Standing Expiration Date:   03/26/2022    Scheduling Instructions:     Please make sure that the patient understands that this needs to be done as soon as possible. Never have the patient do the imaging "just before the next appointment". Inform patient that having the imaging done within the [facility] will expedite the availability of the results and will provide      imaging availability to the requesting physician. In addition inform the patient that the imaging order has an expiration date and will not be renewed if not done within the active period.    Order Specific Question:   What is the patient's sedation requirement?    Answer:   No Sedation    Order Specific Question:   Does the patient have a pacemaker or implanted devices?    Answer:   No    Order Specific Question:   Preferred imaging location?    Answer:   [facility] (table limit-350lbs)    Order Specific Question:   Call Results- Best Contact Number?    Answer:   [phone] ([facility])    Order Specific Question:   Radiology Contrast Protocol - do NOT remove file path    Answer:   [email or web address]   Follow-up plan:   Return in about 1 week (around 03/04/2022) for B/L shoulder injection (anterior), in clinic [facility].    Recent Visits Date Type Provider Dept  12/01/21 Office Visit Gillis Santa, MD [facility]  Showing recent visits within past 90 days and meeting all other requirements Today's Visits Date Type Provider Dept  02/25/22 Office Visit Gillis Santa, MD [facility]  Showing today's visits and meeting all other requirements Future Appointments Date  Type Provider Dept  05/25/22 Appointment Gillis Santa, MD [facility]  Showing future appointments within next 90 days and meeting all other requirements  I discussed the assessment and treatment plan with the patient. The patient was provided an opportunity to ask questions and all were answered. The patient agreed with the plan and demonstrated an understanding of the instructions.  Patient advised to call back or seek an in-person evaluation if the symptoms or condition worsens.  Duration of encounter: 30 minutes.  Note by: Gillis Santa, MD Date: 02/25/2022; Time: 11:31 AM

## 2022-02-25 NOTE — Progress Notes (Signed)
Nursing Pain Medication Assessment:  Safety precautions to be maintained throughout the outpatient stay will include: orient to surroundings, keep bed in low position, maintain call bell within reach at all times, provide assistance with transfer out of bed and ambulation.  Medication Inspection Compliance: Pill count conducted under aseptic conditions, in front of the patient. Neither the pills nor the bottle was removed from the patient's sight at any time. Once count was completed pills were immediately returned to the patient in their original bottle.  Medication: Oxycodone/APAP Pill/Patch Count:  0 of 90 pills remain Pill/Patch Appearance: Markings consistent with prescribed medication Bottle Appearance: Standard pharmacy container. Clearly labeled. Filled Date: 01 / 06 / 2024 Last Medication intake:  YesterdaySafety precautions to be maintained throughout the outpatient stay will include: orient to surroundings, keep bed in low position, maintain call bell within reach at all times, provide assistance with transfer out of bed and ambulation.

## 2022-02-25 NOTE — Patient Instructions (Signed)

## 2022-03-08 ENCOUNTER — Ambulatory Visit: Admission: RE | Admit: 2022-03-08 | Payer: No Typology Code available for payment source | Source: Ambulatory Visit

## 2022-03-10 ENCOUNTER — Other Ambulatory Visit (HOSPITAL_COMMUNITY): Payer: Self-pay

## 2022-03-11 ENCOUNTER — Other Ambulatory Visit: Payer: Self-pay | Admitting: Oncology

## 2022-03-11 ENCOUNTER — Other Ambulatory Visit: Payer: Self-pay

## 2022-03-11 ENCOUNTER — Other Ambulatory Visit (HOSPITAL_COMMUNITY): Payer: Self-pay

## 2022-03-11 DIAGNOSIS — C9110 Chronic lymphocytic leukemia of B-cell type not having achieved remission: Secondary | ICD-10-CM

## 2022-03-11 MED ORDER — BRUKINSA 80 MG PO CAPS
160.0000 mg | ORAL_CAPSULE | Freq: Two times a day (BID) | ORAL | 0 refills | Status: DC
  Filled 2022-03-11: qty 120, 30d supply, fill #0

## 2022-03-11 NOTE — Telephone Encounter (Signed)
CBC with Differential/Platelet Order: [id number] Status: Final result     Visible to patient: Yes (seen)     Next appt: 03/22/2022 at 10:00 AM in Oncology ([facility])     Dx: Normocytic anemia; CLL (chronic lymph...   0 Result Notes            Component Ref Range & Units 1 mo ago (02/05/22) 1 mo ago (01/20/22) 2 mo ago (01/01/22) 2 mo ago (12/26/21) 2 mo ago (12/25/21) 8 mo ago (07/06/21) 6 yr ago (10/22/15) 6 yr ago (10/22/15)  WBC 4.0 - 10.5 K/uL 19.6 High  29.1 High  33.5 High  29.7 High  30.4 High  15.9 High  R  12.9 High  R  RBC 4.22 - 5.81 MIL/uL 3.70 Low  3.79 Low  3.72 Low  3.60 Low  3.43 Low  3.96 Low  R  4.10 Low  R  Hemoglobin 13.0 - 17.0 g/dL 10.5 Low  10.7 Low  10.4 Low  10.1 Low  9.6 Low  10.3 Low  R  11.6 Low  R  HCT 39.0 - 52.0 % 32.8 Low  33.3 Low  32.7 Low  31.4 Low  30.5 Low  33.4 Low  R  34.7 Low  R  MCV 80.0 - 100.0 fL 88.6 87.9 87.9 87.2 88.9 84.3  84.5  MCH 26.0 - 34.0 pg 28.4 28.2 28.0 28.1 28.0 26.0 Low  R  28.2  MCHC 30.0 - 36.0 g/dL 32.0 32.1 31.8 32.2 31.5 30.8 Low  R  33.4 R  RDW 11.5 - 15.5 % 18.6 High  18.0 High  17.3 High  17.3 High  17.3 High  17.7 High  R  18.5 High  R  Platelets 150 - 400 K/uL 136 Low  161 576 High  229 195 174 R  155 R  nRBC 0.0 - 0.2 % 0.0 0.0 0.0 0.0 CM 0.0     Neutrophils Relative % % 14 16 18  29 $ 18.5 63   Neutro Abs 1.7 - 7.7 K/uL 2.7 4.7 5.9  8.8 High  2,942 R 8.2 High  R   Lymphocytes Relative % 82 71 69  62  28   Lymphs Abs 0.7 - 4.0 K/uL 16.1 High  20.4 High  23.1 High   18.7 High  9,953 High  R 3.6 R   Monocytes Relative % 3 12 12  9 $ 16.3 8   Monocytes Absolute 0.1 - 1.0 K/uL 0.6 3.6 High  4.0 High   2.6 High   1.1 High  R   Eosinophils Relative % 1 1 1  $ 0 2.2 0   Eosinophils Absolute 0.0 - 0.5 K/uL 0.2 0.3 0.4  0.1 350 R 0.0 R   Basophils Relative % 0 0 0  0 0.4 1   Basophils Absolute 0.0 - 0.1 K/uL 0.1 0.1 0.1  0.1 64 R 0.1 R   Immature Granulocytes % 0 0 0  0     Abs Immature Granulocytes 0.00 - 0.07 K/uL 0.02 0.08  High  0.09 High  CM  0.10 High      Comment: Performed at [facility], [address] [place], [place] [address]  WBC Morphology   DIFF. CONFIRMED BY SMEAR ABSOLUTE LYMPHOCYTOSIS CM       RBC Morphology   MORPHOLOGY UNREMARKABLE MORPHOLOGY UNREMARKABLE  MORPHOLOGY UNREMARKABLE     Smear Review   Normal platelet morphology CM PLATELETS APPEAR INCREASED CM  Normal platelet morphology CM    Smudge Cells  PRESENT CM   PRESENT CM     Reactive, Benign Lymphocytes      PRESENT     MPV       11.1 R    Absolute Monocytes       2,592 High  R    Total Lymphocyte       62.6 R    Resulting Agency  [place] [facility] [place] [facility] [place] [facility] [place] [facility] [place] [facility] [place] [facility] [place] [facility]         Specimen Collected: 02/05/22 10:50 Last Resulted: 02/05/22 11:01      Lab Flowsheet      Order Details      View Encounter      Lab and Collection Details      Routing      Result History    View All Conversations on this Encounter      CM=Additional comments  R=Reference range differs from displayed range      Result Care Coordination   Patient Communication   Add Comments   Seen Back to Top      Other Results from 02/05/2022   Contains abnormal data Comprehensive metabolic panel Order: [id number] Status: Final result      Visible to patient: Yes (not seen)      Next appt: 03/22/2022 at 10:00 AM in Oncology ([facility])      Dx: Normocytic anemia; CLL (chronic lymph...    0 Result Notes      1 HM Topic             Component Ref Range & Units 1 mo ago (02/05/22) 1 mo ago (01/20/22) 2 mo ago (01/01/22) 2 mo ago (12/26/21) 2 mo ago (12/25/21) 8 mo ago (07/06/21) 6 yr ago (10/22/15)  Sodium 135 - 145 mmol/L 138 139 139 139 140 141 R 138  Potassium 3.5 - 5.1 mmol/L 3.8 4.0 4.1 4.1 3.6 4.1 R 3.3 Low   Chloride 98 - 111 mmol/L 105 106 104 108 105 107 R 107 R  CO2 22 - 32 mmol/L 26 26 26 26 27 25 $ R 27  Glucose, Bld 70 - 99 mg/dL 108 High  109 High   CM 110 High  CM 299 High  CM 188 High  CM 136 High  R, CM 125 High  R  Comment: Glucose reference range applies only to samples taken after fasting for at least 8 hours.  BUN 8 - 23 mg/dL 14 17 15 19 18 19 $ R 16 R  Creatinine, Ser 0.61 - 1.24 mg/dL 1.16 1.05 1.05 1.22 1.35 High  1.08 R 1.25 High   Calcium 8.9 - 10.3 mg/dL 8.5 Low  8.7 Low  8.7 Low  8.3 Low  8.6 Low  9.1 R 8.8 Low   Total Protein 6.5 - 8.1 g/dL 6.7 7.1    6.4 R 7.3  Albumin 3.5 - 5.0 g/dL 3.8 3.8     3.8  AST 15 - 41 U/L 29 35    37 High  R 47 High   ALT 0 - 44 U/L 17 22    26 $ R 31 R  Alkaline Phosphatase 38 - 126 U/L 62 71     73  Total Bilirubin 0.3 - 1.2 mg/dL 0.7 0.6    0.7 R 1.5 High   GFR, Estimated >60 mL/min >60 >60 CM >60 CM >60 CM 55 Low  CM    Comment: (NOTE) Calculated using the  CKD-EPI Creatinine Equation (2021)  Anion gap 5 - 15 7 7 $ CM 9 CM 5 CM 8 CM  4 Low   Comment: Performed at [facility], [address] [place],  [address]  Resulting Agency  [facility] [place] [facility] [place] [facility] [place] [facility] [place] [facility] [place] [facility]         Specimen Collected: 02/05/22 10:50 Last Resulted: 02/05/22 11:13

## 2022-03-12 ENCOUNTER — Other Ambulatory Visit: Payer: Self-pay

## 2022-03-18 ENCOUNTER — Encounter: Payer: Self-pay | Admitting: Oncology

## 2022-03-22 ENCOUNTER — Other Ambulatory Visit: Payer: Self-pay

## 2022-03-22 ENCOUNTER — Encounter: Payer: Self-pay | Admitting: Oncology

## 2022-03-22 ENCOUNTER — Inpatient Hospital Stay (HOSPITAL_BASED_OUTPATIENT_CLINIC_OR_DEPARTMENT_OTHER): Payer: No Typology Code available for payment source | Admitting: Oncology

## 2022-03-22 ENCOUNTER — Inpatient Hospital Stay: Payer: No Typology Code available for payment source | Attending: Oncology

## 2022-03-22 ENCOUNTER — Other Ambulatory Visit (HOSPITAL_COMMUNITY): Payer: Self-pay

## 2022-03-22 VITALS — BP 151/75 | HR 58 | Temp 96.8°F | Resp 18 | Wt 229.2 lb

## 2022-03-22 DIAGNOSIS — Z87891 Personal history of nicotine dependence: Secondary | ICD-10-CM | POA: Diagnosis not present

## 2022-03-22 DIAGNOSIS — Z79899 Other long term (current) drug therapy: Secondary | ICD-10-CM | POA: Insufficient documentation

## 2022-03-22 DIAGNOSIS — Z5111 Encounter for antineoplastic chemotherapy: Secondary | ICD-10-CM

## 2022-03-22 DIAGNOSIS — C9110 Chronic lymphocytic leukemia of B-cell type not having achieved remission: Secondary | ICD-10-CM

## 2022-03-22 DIAGNOSIS — D649 Anemia, unspecified: Secondary | ICD-10-CM | POA: Insufficient documentation

## 2022-03-22 LAB — CBC WITH DIFFERENTIAL/PLATELET
Abs Immature Granulocytes: 0.02 10*3/uL (ref 0.00–0.07)
Basophils Absolute: 0.1 10*3/uL (ref 0.0–0.1)
Basophils Relative: 0 %
Eosinophils Absolute: 0.3 10*3/uL (ref 0.0–0.5)
Eosinophils Relative: 2 %
HCT: 34.6 % — ABNORMAL LOW (ref 39.0–52.0)
Hemoglobin: 10.7 g/dL — ABNORMAL LOW (ref 13.0–17.0)
Immature Granulocytes: 0 %
Lymphocytes Relative: 80 %
Lymphs Abs: 13.2 10*3/uL — ABNORMAL HIGH (ref 0.7–4.0)
MCH: 27.7 pg (ref 26.0–34.0)
MCHC: 30.9 g/dL (ref 30.0–36.0)
MCV: 89.6 fL (ref 80.0–100.0)
Monocytes Absolute: 0.5 10*3/uL (ref 0.1–1.0)
Monocytes Relative: 3 %
Neutro Abs: 2.5 10*3/uL (ref 1.7–7.7)
Neutrophils Relative %: 15 %
Platelets: 157 10*3/uL (ref 150–400)
RBC: 3.86 MIL/uL — ABNORMAL LOW (ref 4.22–5.81)
RDW: 17 % — ABNORMAL HIGH (ref 11.5–15.5)
Smear Review: NORMAL
WBC: 16.5 10*3/uL — ABNORMAL HIGH (ref 4.0–10.5)
nRBC: 0 % (ref 0.0–0.2)

## 2022-03-22 LAB — COMPREHENSIVE METABOLIC PANEL
ALT: 22 U/L (ref 0–44)
AST: 35 U/L (ref 15–41)
Albumin: 3.8 g/dL (ref 3.5–5.0)
Alkaline Phosphatase: 58 U/L (ref 38–126)
Anion gap: 8 (ref 5–15)
BUN: 18 mg/dL (ref 8–23)
CO2: 26 mmol/L (ref 22–32)
Calcium: 8.9 mg/dL (ref 8.9–10.3)
Chloride: 103 mmol/L (ref 98–111)
Creatinine, Ser: 1.33 mg/dL — ABNORMAL HIGH (ref 0.61–1.24)
GFR, Estimated: 56 mL/min — ABNORMAL LOW (ref >60)
Glucose, Bld: 99 mg/dL (ref 70–99)
Potassium: 4.1 mmol/L (ref 3.5–5.1)
Sodium: 137 mmol/L (ref 135–145)
Total Bilirubin: 0.7 mg/dL (ref 0.3–1.2)
Total Protein: 6.8 g/dL (ref 6.5–8.1)

## 2022-03-22 MED ORDER — BRUKINSA 80 MG PO CAPS
160.0000 mg | ORAL_CAPSULE | Freq: Two times a day (BID) | ORAL | 1 refills | Status: DC
  Filled 2022-03-22 – 2022-04-16 (×2): qty 120, 30d supply, fill #0
  Filled 2022-05-11: qty 120, 30d supply, fill #1

## 2022-03-22 NOTE — Progress Notes (Signed)
Hematology/Oncology Progress note [phone] [phone]        ASSESSMENT & PLAN:   Cancer Staging  CLL (chronic lymphocytic leukemia) ([place]) Staging form: Chronic Lymphocytic Leukemia / Small Lymphocytic Lymphoma, AJCC 8th Edition - Clinical stage from 12/28/2021: Modified Rai Stage III (Modified Rai risk: High, Lymphocytosis: Present, Adenopathy: Present, Organomegaly: Absent, Anemia: Present, Thrombocytopenia: Absent) - Signed by Earlie Server, MD on 01/20/2022   CLL (chronic lymphocytic leukemia) [facility]) Labs are reviewed and discussed with patient. CT chest abdomen pelvis results reviewed and discussed with patient.   Rai stage III CLL with unintentional weight loss 13q delesion, IgVH unmutated Labs are reviewed and discussed with patient.he tolerates well.  Continue Zanubrutinib 160 mg twice daily    Encounter for antineoplastic chemotherapy Continue treatment as mentioned above.   Normocytic anemia Stable. Anemia is likely multifactorial, could be due to CLL    Orders Placed This Encounter  Procedures   CBC with Differential ([place] Only)    Standing Status:   Future    Standing Expiration Date:   03/23/2023   CMP ([place] only)    Standing Status:   Future    Standing Expiration Date:   03/23/2023    Follow up  2 months.  All questions were answered. The patient knows to call the clinic with any problems, questions or concerns.  Earlie Server, MD, PhD [facility] Hematology Oncology 03/22/2022    CHIEF COMPLAINTS/PURPOSE OF CONSULTATION:  CLL  HISTORY OF PRESENTING ILLNESS:  Paul Ramus [name] 77 y.o. male presents for follow up of CLL I have reviewed his chart and materials related to his cancer extensively and collaborated history with the patient. Summary of oncologic history is as follows: Oncology History  CLL (chronic lymphocytic leukemia) ([place])  12/25/2021 - 12/26/2021 Hospital Admission   Patient was admitted due to  pain and swelling of right ankle, right knee and left wrist.  Patient was found to have WBC 30.4, with neutrophil 29% and lymphocyte 2%.  Peripheral smear showed leukocytosis with slight left shift in myeloid series and lymphocytosis with abnormal morphology.  Uric acid 5.5, worsening renal function with creatinine 1.35, BUN 18, GFR 55 (baseline creatinine 1.08 on 07/06/2021), lactic acid 1.1.  ESR normal, uric acid 5.5 normal, CRP 22.2, LDH 182  Orthopedic surgery was consulted s/p arthrocentesis, cell count 296 with 72% neutrophil, no crystal. Fluid negative for growth.  Patient received cefepime and vancomycin while in the hospital, Patient was discharged on Keflex for 5 days.   12/28/2021 Cancer Staging   Staging form: Chronic Lymphocytic Leukemia / Small Lymphocytic Lymphoma, AJCC 8th Edition - Clinical stage from 12/28/2021: Modified Rai Stage III (Modified Rai risk: High, Lymphocytosis: Present, Adenopathy: Present, Organomegaly: Absent, Anemia: Present, Thrombocytopenia: Absent) - Signed by Earlie Server, MD on 01/20/2022 Stage prefix: Initial diagnosis Hemoglobin (Hgb) (g/dL): 10.1   01/01/2022 Initial Diagnosis   CLL (chronic lymphocytic leukemia)   12/27/21 peripheral blood flowcytometry showed Involvement by CD5+, CD23+, CD20+, CD22+ clonal B cell population, phenotype typical for 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    01/05/2022 Imaging   CT chest abdomen pelvis wo contrast 1. Multiple prominent borderline enlarged and mildly enlarged lymph nodes, most evident in the low anatomic pelvis, as above, compatible with reported clinical history of CLL. 2. There also several small pulmonary nodules in the lungs measuring 5 mm or  less in size. This is nonspecific, but statistically likely benign. No follow-up needed if patient is  low-risk (and has no known or suspected primary neoplasm). Non-contrast chest CT can be considered in 12 months if patient is high-risk. This recommendation 3. Aortic atherosclerosis, in addition to left main and 2 vessel coronary artery disease. Please note that although the presence of coronary artery calcium documents the presence of coronary artery disease, the severity of this disease and any potential stenosis cannot be assessed on this non-gated CT examination. Assessment for  potential risk factor modification, dietary therapy or pharmacologic therapy may be warranted, if clinically indicated. 4. There are calcifications of the aortic valve. Echocardiographic correlation for evaluation of potential valvular dysfunction may be warranted if clinically indicated. 5. 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 6. Diverticulosis without evidence of acute diverticulitis at this time. 7. Mild cardiomegaly.    + Unintentional weight loss INTERVAL HISTORY Paul Seright [name] is a 77 y.o. male who has above history reviewed by me today presents for follow up visit for  CLL. He takes Zanubrutinib 160 mg twice daily Tolerates well, appetite is fair. No new complaints.   MEDICAL HISTORY:  Past Medical History:  Diagnosis Date   Acute prostatitis    BPH (benign prostatic hyperplasia)    Chronic kidney disease    Dysplasia of prostate    Erectile dysfunction    HTN (hypertension)    Hypogonadism in male    Orchitis and epididymitis    Over weight    Palindromic rheumatism, hand    Prostatitis    Rectum pain    Testicle swelling    Testicle tenderness    Testicular mass     SURGICAL HISTORY: Past Surgical History:  Procedure Laterality Date   BACK SURGERY     head surgery     from fall  drilled hole in brain to relieve pressure    SOCIAL HISTORY: Social History   Socioeconomic History   Marital status: Married    Spouse name: Not on file   Number of children:  Not on file   Years of education: Not on file   Highest education level: Not on file  Occupational History   Not on file  Tobacco Use   Smoking status: Former   Smokeless tobacco: Never   Tobacco comments:    quit 30 years ago  Vaping Use   Vaping Use: Never used  Substance and Sexual Activity   Alcohol use: No    Alcohol/week: 0.0 standard drinks of alcohol   Drug use: No   Sexual activity: Not on file  Other Topics Concern   Not on file  Social History Narrative   Not on file   Social Determinants of Health   Financial Resource Strain: Low Risk  (01/22/2022)   Overall Financial Resource Strain (CARDIA)    Difficulty of Paying Living Expenses: Not very hard  Food Insecurity: No Food Insecurity (12/25/2021)   Hunger Vital Sign    Worried About Running Out of Food in the Last Year: Never true    Ran Out of Food in the Last Year: Never true  Transportation Needs: No Transportation Needs (01/22/2022)   PRAPARE - Hydrologist (Medical): No    Lack of Transportation (Non-Medical): No  Physical Activity: Inactive (01/22/2022)   Exercise Vital Sign    Days of Exercise per Week: 0 days    Minutes of Exercise per Session: 0 min  Stress:  Stress Concern Present (01/22/2022)   [place]    Feeling of Stress : To some extent  Social Connections: Socially Integrated (01/22/2022)   Social Connection and Isolation Panel [NHANES]    Frequency of Communication with Friends and Family: More than three times a week    Frequency of Social Gatherings with Friends and Family: Twice a week    Attends Religious Services: 1 to 4 times per year    Active Member of [employer] or Organizations: Yes    Attends Archivist Meetings: Never    Marital Status: Married  Human resources officer Violence: Not At Risk (01/22/2022)   Humiliation, Afraid, Rape, and Kick questionnaire    Fear of Current or  Ex-Partner: No    Emotionally Abused: No    Physically Abused: No    Sexually Abused: No    FAMILY HISTORY: Family History  Problem Relation Age of Onset   Cancer Maternal Aunt    Prostate cancer Neg Hx    Kidney disease Neg Hx    Kidney cancer Neg Hx    Bladder Cancer Neg Hx     ALLERGIES:  has No Known Allergies.  MEDICATIONS:  Current Outpatient Medications  Medication Sig Dispense Refill   amLODipine (NORVASC) 10 MG tablet Take 10 mg by mouth daily.     calcium-vitamin D (OSCAL WITH D) 500-5 MG-MCG tablet Take 2 tablets by mouth daily. 60 tablet 2   levothyroxine (SYNTHROID) 200 MCG tablet Take 200 mcg by mouth daily.     metFORMIN (GLUCOPHAGE) 500 MG tablet Take 500 mg by mouth 2 (two) times daily with a meal.     mupirocin ointment (BACTROBAN) 2 % Apply 1 Application topically 2 (two) times daily. 22 g 0   oxyCODONE-acetaminophen (PERCOCET) 10-325 MG tablet Take 1 tablet by mouth every 8 (eight) hours as needed. 90 tablet 0   potassium chloride (KLOR-CON M) 10 MEQ tablet Take 10 mEq by mouth 2 (two) times daily.     pregabalin (LYRICA) 75 MG capsule Take 1 capsule (75 mg total) by mouth 2 (two) times daily. 60 capsule 2   rosuvastatin (CRESTOR) 10 MG tablet Take 1 tablet by mouth once daily 90 tablet 0   tamsulosin (FLOMAX) 0.4 MG CAPS capsule Take 1 capsule (0.4 mg total) by mouth daily. 90 capsule 3   valsartan-hydrochlorothiazide (DIOVAN-HCT) 320-25 MG tablet Take 1 tablet by mouth daily. 90 tablet 3   [START ON 03/27/2022] oxyCODONE-acetaminophen (PERCOCET) 10-325 MG tablet Take 1 tablet by mouth every 8 (eight) hours as needed. (Patient not taking: Reported on 03/22/2022) 90 tablet 0   [START ON 04/26/2022] oxyCODONE-acetaminophen (PERCOCET) 10-325 MG tablet Take 1 tablet by mouth every 8 (eight) hours as needed. (Patient not taking: Reported on 03/22/2022) 90 tablet 0   zanubrutinib (BRUKINSA) 80 MG capsule Take 2 capsules (160 mg total) by mouth 2 (two) times daily. 120  capsule 1   No current facility-administered medications for this visit.    Review of Systems  Constitutional:  Positive for fatigue. Negative for appetite change, chills, fever and unexpected weight change.  HENT:   Negative for hearing loss and voice change.   Eyes:  Negative for eye problems and icterus.  Respiratory:  Negative for chest tightness, cough and shortness of breath.   Cardiovascular:  Negative for chest pain and leg swelling.  Gastrointestinal:  Negative for abdominal distention and abdominal pain.  Endocrine: Negative for hot flashes.  Genitourinary:  Negative for  difficulty urinating, dysuria and frequency.   Musculoskeletal:  Positive for arthralgias.  Skin:  Negative for itching and rash.  Neurological:  Negative for light-headedness and numbness.  Hematological:  Negative for adenopathy. Does not bruise/bleed easily.  Psychiatric/Behavioral:  Negative for confusion.      PHYSICAL EXAMINATION: ECOG PERFORMANCE STATUS: 1 - Symptomatic but completely ambulatory  Vitals:   03/22/22 1015  BP: (!) 151/75  Pulse: (!) 58  Resp: 18  Temp: (!) 96.8 F (36 C)   Filed Weights   03/22/22 1015  Weight: 229 lb 3.2 oz (104 kg)    Physical Exam Constitutional:      General: He is not in acute distress.    Appearance: He is not diaphoretic.  HENT:     Head: Normocephalic and atraumatic.     Nose: Nose normal.     Mouth/Throat:     Pharynx: No oropharyngeal exudate.  Eyes:     General: No scleral icterus.    Pupils: Pupils are equal, round, and reactive to light.  Cardiovascular:     Rate and Rhythm: Normal rate.     Heart sounds: No murmur heard. Pulmonary:     Effort: Pulmonary effort is normal. No respiratory distress.     Breath sounds: No wheezing.  Abdominal:     General: There is no distension.     Palpations: Abdomen is soft.     Tenderness: There is no abdominal tenderness.  Musculoskeletal:     Cervical back: Normal range of motion and neck  supple.  Skin:    General: Skin is warm and dry.     Findings: No erythema.  Neurological:     Mental Status: He is alert and oriented to person, place, and time. Mental status is at baseline.     Cranial Nerves: No cranial nerve deficit.     Motor: No abnormal muscle tone.  Psychiatric:        Mood and Affect: Mood and affect normal.      LABORATORY DATA:  I have reviewed the data as listed    Latest Ref Rng & Units 03/22/2022    9:48 AM 02/05/2022   10:50 AM 01/20/2022    1:25 PM  CBC  WBC 4.0 - 10.5 K/uL 16.5  19.6  29.1   Hemoglobin 13.0 - 17.0 g/dL 10.7  10.5  10.7   Hematocrit 39.0 - 52.0 % 34.6  32.8  33.3   Platelets 150 - 400 K/uL 157  136  161       Latest Ref Rng & Units 03/22/2022    9:48 AM 02/05/2022   10:50 AM 01/20/2022    1:25 PM  CMP  Glucose 70 - 99 mg/dL 99  108  109   BUN 8 - 23 mg/dL '18  14  17   '$ Creatinine 0.61 - 1.24 mg/dL 1.33  1.16  1.05   Sodium 135 - 145 mmol/L 137  138  139   Potassium 3.5 - 5.1 mmol/L 4.1  3.8  4.0   Chloride 98 - 111 mmol/L 103  105  106   CO2 22 - 32 mmol/L '26  26  26   '$ Calcium 8.9 - 10.3 mg/dL 8.9  8.5  8.7   Total Protein 6.5 - 8.1 g/dL 6.8  6.7  7.1   Total Bilirubin 0.3 - 1.2 mg/dL 0.7  0.7  0.6   Alkaline Phos 38 - 126 U/L 58  62  71   AST 15 - 41 U/L  35  29  35   ALT 0 - 44 U/L '22  17  22      '$ RADIOGRAPHIC STUDIES: I have personally reviewed the radiological images as listed and agreed with the findings in the report. No results found.

## 2022-03-22 NOTE — Assessment & Plan Note (Signed)
Labs are reviewed and discussed with patient. CT chest abdomen pelvis results reviewed and discussed with patient.   Rai stage III CLL with unintentional weight loss 13q delesion, IgVH unmutated Labs are reviewed and discussed with patient.he tolerates well.  Continue Zanubrutinib 160 mg twice daily

## 2022-03-22 NOTE — Assessment & Plan Note (Signed)
Stable. Anemia is likely multifactorial, could be due to CLL

## 2022-03-22 NOTE — Assessment & Plan Note (Signed)
Continue treatment as mentioned above.

## 2022-03-29 NOTE — Progress Notes (Signed)
10/03/21 11:34 AM   Paul Ramus [name] 06-22-1945 [id number]  Referring provider:  Cletis [place], MD [address] [place],  [place] [address]  Urological history  1. ED -contributing factors of age, BPH, spinal injury, brain injury, DM, HTN, HLD and sleep apnea -PDE5i's intolerable    2. BPH with LU TS -I PSS *** -managed with tamsulosin 0.4 mg   3. Prostate cancer screening -PSA (09/2021) 0.3 -maternal uncle with fatal prostate cancer -Discontinued screening at this time  Chief Complaint  Patient presents with   Erectile Dysfunction   Benign Prostatic Hypertrophy     HPI: Paul Magro [name] is a 77 y.o.male who presents today for follow up I PSS and PVR after restarting tamsulosin 0.4 mg daily  I PSS ***  PVR ***   IPSS     Row Name 10/02/21 1100         International Prostate Symptom Score   How often have you had the sensation of not emptying your bladder? About half the time     How often have you had to urinate less than every two hours? Less than half the time     How often have you found you stopped and started again several times when you urinated? Almost always     How often have you found it difficult to postpone urination? Not at All     How often have you had a weak urinary stream? Less than half the time     How often have you had to strain to start urination? Not at All     How many times did you typically get up at night to urinate? 2 Times     Total IPSS Score 14       Quality of Life due to urinary symptoms   If you were to spend the rest of your life with your urinary condition just the way it is now how would you feel about that? Mixed               Score:  1-7 Mild 8-19 Moderate 20-35 Severe  PMH: Past Medical History:  Diagnosis Date   Acute prostatitis    BPH (benign prostatic hyperplasia)    Chronic kidney disease    Dysplasia of prostate    Erectile dysfunction    HTN (hypertension)    Hypogonadism in male     Orchitis and epididymitis    Over weight    Palindromic rheumatism, hand    Prostatitis    Rectum pain    Testicle swelling    Testicle tenderness    Testicular mass     Surgical History: Past Surgical History:  Procedure Laterality Date   BACK SURGERY     head surgery     from fall  drilled hole in brain to relieve pressure    Home Medications:  Allergies as of 10/02/2021   No Known Allergies      Medication List        Accurate as of October 02, 2021 11:59 PM. If you have any questions, ask your nurse or doctor.          STOP taking these medications    amLODipine 10 MG tablet Commonly known as: NORVASC Stopped by: Theresia Lo, NP   B-D SINGLE USE SWABS REGULAR Pads Stopped by: Theresia Lo, NP   cyclobenzaprine 5 MG tablet Commonly known as: FLEXERIL Stopped by: Theresia Lo, NP   pregabalin 50 MG capsule  Commonly known as: Lyrica Stopped by: Theresia Lo, NP       TAKE these medications    ARTHRITIS STRENGTH BC POWDER PO Take by mouth.   HAIR SKIN NAILS PO Take 1 Capful by mouth daily at 2 PM.   Joint Support Caps Take 1 tablet by mouth daily at 2 PM.   PROSTATE SUPPORT PO Take 1 capsule by mouth daily at 2 PM.   Lancets 33G Misc Check blood sugar daily   LAXATIVE PO Take 1 each by mouth daily at 2 PM.   levothyroxine 175 MCG tablet Commonly known as: SYNTHROID Take 1 tablet (175 mcg total) by mouth daily.   oxyCODONE-acetaminophen 10-325 MG tablet Commonly known as: PERCOCET Take 1 tablet by mouth every 8 (eight) hours as needed. What changed: Another medication with the same name was removed. Continue taking this medication, and follow the directions you see here. Changed by: Theresia Lo, NP   tamsulosin 0.4 MG Caps capsule Commonly known as: FLOMAX Take 1 capsule (0.4 mg total) by mouth daily. Started by: Zara Council, PA-C   True Metrix Level 1 Low Soln 1 Bottle by In Vitro route daily.    True Metrix Meter Devi Check blood sugar daily   True Metrix Pro Blood Glucose test strip Generic drug: glucose blood Use as instructed   valsartan-hydrochlorothiazide 320-25 MG tablet Commonly known as: DIOVAN-HCT Take 1 tablet by mouth daily.        Allergies:  No Known Allergies  Family History: Family History  Problem Relation Age of Onset   Cancer Maternal Aunt    Prostate cancer Neg Hx    Kidney disease Neg Hx    Kidney cancer Neg Hx    Bladder Cancer Neg Hx     Social History:  reports that he has quit smoking. He has never used smokeless tobacco. He reports that he does not drink alcohol and does not use drugs.   Physical Exam: BP (!) 157/85   Pulse 64   Ht '6\' 1"'$  (1.854 m)   Wt 227 lb (103 kg)   BMI 29.95 kg/m   Constitutional:  Well nourished. Alert and oriented, No acute distress. HEENT: [place] AT, moist mucus membranes.  Trachea midline Cardiovascular: No clubbing, cyanosis, or edema. Respiratory: Normal respiratory effort, no increased work of breathing. Neurologic: Grossly intact, no focal deficits, moving all 4 extremities. Psychiatric: Normal mood and affect.   Laboratory Data:   I have reviewed the labs.   Pertinent Imaging: N/A  Assessment & Plan:    1. ED -he is no longer interested in sexual activity  2. BPH with LUTS -most bothersome symptoms are nocturia -continue conservative management, avoiding bladder irritants and timed voiding's -Continue tamsulosin 0.4 mg daily  Return in about 1 month (around 11/01/2021) for I PSS and PVR .  Vy Badley, [place] [address] [place], [place] [address] [phone]

## 2022-03-30 ENCOUNTER — Encounter: Payer: Self-pay | Admitting: Urology

## 2022-03-30 ENCOUNTER — Ambulatory Visit (INDEPENDENT_AMBULATORY_CARE_PROVIDER_SITE_OTHER): Payer: No Typology Code available for payment source | Admitting: Urology

## 2022-03-30 VITALS — BP 175/84 | HR 66 | Ht 73.0 in | Wt 234.6 lb

## 2022-03-30 DIAGNOSIS — N529 Male erectile dysfunction, unspecified: Secondary | ICD-10-CM

## 2022-03-30 DIAGNOSIS — N138 Other obstructive and reflux uropathy: Secondary | ICD-10-CM

## 2022-03-30 DIAGNOSIS — N3941 Urge incontinence: Secondary | ICD-10-CM | POA: Diagnosis not present

## 2022-03-30 DIAGNOSIS — N401 Enlarged prostate with lower urinary tract symptoms: Secondary | ICD-10-CM

## 2022-03-30 LAB — BLADDER SCAN AMB NON-IMAGING: Scan Result: 88

## 2022-03-30 MED ORDER — TROSPIUM CHLORIDE 20 MG PO TABS: 20.0000 mg | ORAL_TABLET | Freq: Two times a day (BID) | ORAL | 0 refills | Status: DC

## 2022-03-30 NOTE — Patient Instructions (Signed)
Take the trospium 20 mg tablet once daily

## 2022-04-01 ENCOUNTER — Telehealth: Payer: Self-pay

## 2022-04-01 DIAGNOSIS — M48062 Spinal stenosis, lumbar region with neurogenic claudication: Secondary | ICD-10-CM

## 2022-04-01 DIAGNOSIS — M5416 Radiculopathy, lumbar region: Secondary | ICD-10-CM

## 2022-04-01 DIAGNOSIS — G8929 Other chronic pain: Secondary | ICD-10-CM

## 2022-04-01 DIAGNOSIS — M47816 Spondylosis without myelopathy or radiculopathy, lumbar region: Secondary | ICD-10-CM

## 2022-04-01 NOTE — Telephone Encounter (Signed)
You had ordered an MRI for this patient. It was approved and scheduled, I called the patients wife and gave her the date but he is saying she did not tell him so he didn't go. The order is expired so they will not let him reschedule.

## 2022-04-06 ENCOUNTER — Other Ambulatory Visit (HOSPITAL_COMMUNITY): Payer: Self-pay

## 2022-04-08 NOTE — Telephone Encounter (Signed)
Hey can you put in another MRI order? The first one expired because he missed the appt.

## 2022-04-13 ENCOUNTER — Other Ambulatory Visit (HOSPITAL_COMMUNITY): Payer: Self-pay

## 2022-04-14 ENCOUNTER — Telehealth: Payer: Self-pay | Admitting: Student in an Organized Health Care Education/Training Program

## 2022-04-14 NOTE — Telephone Encounter (Signed)
[name] calling about authorization for this patient MRI. He is scheduled for 04-17-22 to have MRI and needs authorization ?  Scheduled by Dorothea Ogle in scheduling

## 2022-04-16 ENCOUNTER — Other Ambulatory Visit: Payer: Self-pay

## 2022-04-16 ENCOUNTER — Other Ambulatory Visit (HOSPITAL_COMMUNITY): Payer: Self-pay

## 2022-04-17 ENCOUNTER — Ambulatory Visit: Payer: No Typology Code available for payment source

## 2022-04-19 ENCOUNTER — Other Ambulatory Visit (HOSPITAL_COMMUNITY): Payer: Self-pay

## 2022-04-19 ENCOUNTER — Telehealth: Payer: Self-pay

## 2022-04-19 ENCOUNTER — Other Ambulatory Visit: Payer: Self-pay

## 2022-04-19 NOTE — Telephone Encounter (Signed)
Back in February you ordered a Cspine MRI. I got the authorization and he didn't go. You put in another MRI order but you put Lumbar and that is what they scheduled. I do not have authorization for lumbar only a cervical. Was that a mistake? It is supposed to be cervical? Please advise, he is scheduled for 3/27.

## 2022-04-20 ENCOUNTER — Other Ambulatory Visit: Payer: Self-pay | Admitting: Student in an Organized Health Care Education/Training Program

## 2022-04-20 DIAGNOSIS — M5412 Radiculopathy, cervical region: Secondary | ICD-10-CM

## 2022-04-20 DIAGNOSIS — M542 Cervicalgia: Secondary | ICD-10-CM

## 2022-04-21 ENCOUNTER — Ambulatory Visit
Admission: RE | Admit: 2022-04-21 | Discharge: 2022-04-21 | Disposition: A | Discharge status: Home / Self Care | Payer: No Typology Code available for payment source | Source: Ambulatory Visit | Attending: Student in an Organized Health Care Education/Training Program | Admitting: Student in an Organized Health Care Education/Training Program

## 2022-04-21 DIAGNOSIS — M5412 Radiculopathy, cervical region: Secondary | ICD-10-CM | POA: Diagnosis not present

## 2022-04-21 DIAGNOSIS — M542 Cervicalgia: Secondary | ICD-10-CM | POA: Diagnosis not present

## 2022-04-21 DIAGNOSIS — M47812 Spondylosis without myelopathy or radiculopathy, cervical region: Secondary | ICD-10-CM | POA: Diagnosis not present

## 2022-04-21 DIAGNOSIS — M4802 Spinal stenosis, cervical region: Secondary | ICD-10-CM | POA: Diagnosis not present

## 2022-04-21 DIAGNOSIS — M5021 Other cervical disc displacement,  high cervical region: Secondary | ICD-10-CM | POA: Diagnosis not present

## 2022-04-26 ENCOUNTER — Telehealth: Payer: Self-pay | Admitting: *Deleted

## 2022-04-26 NOTE — Telephone Encounter (Addendum)
Patient called asking if his medicine is causing dizziness He states that it just started 2 weeks ago and occurs when he is working outside, worse when bending States that he is eating and drinking well He is diabetic and reports that he thought his sugar had dropped but he did not check it. He says that he has had a couple episodes today, and he does not have them everyday. He states that his PCP is not there any more and he is trying to find a new one. I do not see dizziness on the list of side effects for his zanubrutinib  I have asked for him to carry his glucometer with him and keep record of his glucose levels when he has these spells, he agrees to do so. Please advise of any orders

## 2022-04-27 ENCOUNTER — Other Ambulatory Visit: Payer: Self-pay | Admitting: *Deleted

## 2022-04-27 DIAGNOSIS — R42 Dizziness and giddiness: Secondary | ICD-10-CM

## 2022-04-27 DIAGNOSIS — C9110 Chronic lymphocytic leukemia of B-cell type not having achieved remission: Secondary | ICD-10-CM

## 2022-04-27 NOTE — Telephone Encounter (Signed)
Patient has been scheduled and notified for apt with [facility] with lab tomorrow by schedulers

## 2022-04-28 ENCOUNTER — Inpatient Hospital Stay: Payer: No Typology Code available for payment source

## 2022-04-28 ENCOUNTER — Other Ambulatory Visit: Payer: No Typology Code available for payment source

## 2022-04-28 ENCOUNTER — Ambulatory Visit: Payer: No Typology Code available for payment source

## 2022-04-28 ENCOUNTER — Other Ambulatory Visit: Payer: Self-pay

## 2022-04-28 ENCOUNTER — Encounter: Payer: Self-pay | Admitting: Medical Oncology

## 2022-04-28 ENCOUNTER — Inpatient Hospital Stay: Payer: No Typology Code available for payment source | Attending: Oncology

## 2022-04-28 ENCOUNTER — Inpatient Hospital Stay (HOSPITAL_BASED_OUTPATIENT_CLINIC_OR_DEPARTMENT_OTHER): Payer: No Typology Code available for payment source | Admitting: Medical Oncology

## 2022-04-28 VITALS — BP 156/83 | HR 65 | Temp 97.1°F | Resp 18 | Wt 230.2 lb

## 2022-04-28 DIAGNOSIS — I159 Secondary hypertension, unspecified: Secondary | ICD-10-CM | POA: Diagnosis not present

## 2022-04-28 DIAGNOSIS — C9110 Chronic lymphocytic leukemia of B-cell type not having achieved remission: Secondary | ICD-10-CM

## 2022-04-28 DIAGNOSIS — E876 Hypokalemia: Secondary | ICD-10-CM | POA: Insufficient documentation

## 2022-04-28 DIAGNOSIS — R5383 Other fatigue: Secondary | ICD-10-CM

## 2022-04-28 DIAGNOSIS — R7401 Elevation of levels of liver transaminase levels: Secondary | ICD-10-CM | POA: Diagnosis not present

## 2022-04-28 DIAGNOSIS — D509 Iron deficiency anemia, unspecified: Secondary | ICD-10-CM | POA: Insufficient documentation

## 2022-04-28 DIAGNOSIS — E860 Dehydration: Secondary | ICD-10-CM | POA: Insufficient documentation

## 2022-04-28 DIAGNOSIS — R051 Acute cough: Secondary | ICD-10-CM | POA: Diagnosis not present

## 2022-04-28 DIAGNOSIS — R42 Dizziness and giddiness: Secondary | ICD-10-CM

## 2022-04-28 DIAGNOSIS — N1831 Chronic kidney disease, stage 3a: Secondary | ICD-10-CM | POA: Diagnosis not present

## 2022-04-28 DIAGNOSIS — R7989 Other specified abnormal findings of blood chemistry: Secondary | ICD-10-CM | POA: Diagnosis not present

## 2022-04-28 DIAGNOSIS — E039 Hypothyroidism, unspecified: Secondary | ICD-10-CM | POA: Diagnosis not present

## 2022-04-28 DIAGNOSIS — I959 Hypotension, unspecified: Secondary | ICD-10-CM | POA: Diagnosis not present

## 2022-04-28 DIAGNOSIS — N183 Chronic kidney disease, stage 3 unspecified: Secondary | ICD-10-CM | POA: Diagnosis not present

## 2022-04-28 DIAGNOSIS — D649 Anemia, unspecified: Secondary | ICD-10-CM | POA: Diagnosis not present

## 2022-04-28 DIAGNOSIS — I129 Hypertensive chronic kidney disease with stage 1 through stage 4 chronic kidney disease, or unspecified chronic kidney disease: Secondary | ICD-10-CM | POA: Diagnosis not present

## 2022-04-28 DIAGNOSIS — R634 Abnormal weight loss: Secondary | ICD-10-CM | POA: Insufficient documentation

## 2022-04-28 LAB — CBC WITH DIFFERENTIAL (CANCER CENTER ONLY)
Abs Immature Granulocytes: 0.05 10*3/uL (ref 0.00–0.07)
Basophils Absolute: 0.1 10*3/uL (ref 0.0–0.1)
Basophils Relative: 1 %
Eosinophils Absolute: 0.3 10*3/uL (ref 0.0–0.5)
Eosinophils Relative: 2 %
HCT: 30.4 % — ABNORMAL LOW (ref 39.0–52.0)
Hemoglobin: 9.8 g/dL — ABNORMAL LOW (ref 13.0–17.0)
Immature Granulocytes: 0 %
Lymphocytes Relative: 70 %
Lymphs Abs: 10.6 10*3/uL — ABNORMAL HIGH (ref 0.7–4.0)
MCH: 28.2 pg (ref 26.0–34.0)
MCHC: 32.2 g/dL (ref 30.0–36.0)
MCV: 87.4 fL (ref 80.0–100.0)
Monocytes Absolute: 0.8 10*3/uL (ref 0.1–1.0)
Monocytes Relative: 5 %
Neutro Abs: 3.4 10*3/uL (ref 1.7–7.7)
Neutrophils Relative %: 22 %
Platelet Count: 136 10*3/uL — ABNORMAL LOW (ref 150–400)
RBC: 3.48 MIL/uL — ABNORMAL LOW (ref 4.22–5.81)
RDW: 16.6 % — ABNORMAL HIGH (ref 11.5–15.5)
Smear Review: NORMAL
WBC Count: 15.3 10*3/uL — ABNORMAL HIGH (ref 4.0–10.5)
nRBC: 0 % (ref 0.0–0.2)

## 2022-04-28 LAB — COMPREHENSIVE METABOLIC PANEL
ALT: 42 U/L (ref 0–44)
AST: 82 U/L — ABNORMAL HIGH (ref 15–41)
Albumin: 3.8 g/dL (ref 3.5–5.0)
Alkaline Phosphatase: 62 U/L (ref 38–126)
Anion gap: 7 (ref 5–15)
BUN: 23 mg/dL (ref 8–23)
CO2: 29 mmol/L (ref 22–32)
Calcium: 9 mg/dL (ref 8.9–10.3)
Chloride: 106 mmol/L (ref 98–111)
Creatinine, Ser: 1.53 mg/dL — ABNORMAL HIGH (ref 0.61–1.24)
GFR, Estimated: 47 mL/min — ABNORMAL LOW (ref >60)
Glucose, Bld: 103 mg/dL — ABNORMAL HIGH (ref 70–99)
Potassium: 3.3 mmol/L — ABNORMAL LOW (ref 3.5–5.1)
Sodium: 142 mmol/L (ref 135–145)
Total Bilirubin: 0.7 mg/dL (ref 0.3–1.2)
Total Protein: 6.8 g/dL (ref 6.5–8.1)

## 2022-04-28 LAB — LACTATE DEHYDROGENASE: LDH: 340 U/L — ABNORMAL HIGH (ref 98–192)

## 2022-04-28 MED ORDER — POTASSIUM CHLORIDE CRYS ER 20 MEQ PO TBCR
20.0000 meq | EXTENDED_RELEASE_TABLET | Freq: Every evening | ORAL | 3 refills | Status: DC
Start: 2022-04-28 — End: 2022-05-21

## 2022-04-28 MED ORDER — POTASSIUM CHLORIDE CRYS ER 10 MEQ PO TBCR: 10.0000 meq | EXTENDED_RELEASE_TABLET | Freq: Every morning | ORAL | 3 refills | Status: DC

## 2022-04-28 MED ORDER — SODIUM CHLORIDE 0.9 % IV SOLN
INTRAVENOUS | Status: DC
  Filled 2022-04-28 (×2): qty 250

## 2022-04-28 NOTE — Progress Notes (Signed)
[facility]pt[facility] [place] at [facility] [phone] [phone]) [phone]  Patient Care Team: Patient, No Pcp Per as PCP - General (General Practice) Earlie Server, MD as Consulting Physician (Oncology)   Name of the patient: Paul Briggs  [id number]  1945-11-11   Oncological History: CLL  Current Treatment: Zanubrutinib 160 mg BID  Date of visit: 04/28/22  Reason for Consult: Kristoff Chronister [name] is a 77 y.o. male who presents today for:  Dizziness: Patient noticed dizziness over the past few days. Started when he was working outside. Checked his blood sugar as he thought this may be the cause- sugar was normal. He has had a few more incidences since. He questions if this may be related to his Zanubrutinib medication. He does report checking his blood pressure and it was as low as 88/47 when he was first dizzy. Took an energy drink and felt better. Works outside and does not drink hardly any water or fluids. BP readings have varied greatly since. He is not currently on his blood pressure medications as he felt as if his blood pressure was "all over the place". He also recently started Trospium chloride for his BPH. He also was recently sick with what he suspected to be a viral illness. Still has a cough that has lingered for about 10 days. Cough is non-productive without fever, SOB, chest pain.   Of note he states that he does not currently have a PCP as he did not feel that he was getting good care at his recent establishment. Previously a patient of [facility] which he liked but his insurance is not taken there. He is looking to get established elsewhere.    Denies any neurologic complaints. Denies recent fevers. Denies any easy bleeding or bruising. Reports good appetite and denies weight loss. Denies chest pain. Denies any nausea, vomiting, constipation, or diarrhea. Denies urinary complaints. Patient offers no further specific  complaints today.  Wt Readings from Last 3 Encounters:  04/28/22 230 lb 3.2 oz (104.4 kg)  03/30/22 234 lb 9.6 oz (106.4 kg)  03/22/22 229 lb 3.2 oz (104 kg)      PAST MEDICAL HISTORY: Past Medical History:  Diagnosis Date   Acute prostatitis    BPH (benign prostatic hyperplasia)    Chronic kidney disease    Dysplasia of prostate    Erectile dysfunction    HTN (hypertension)    Hypogonadism in male    Orchitis and epididymitis    Over weight    Palindromic rheumatism, hand    Prostatitis    Rectum pain    Testicle swelling    Testicle tenderness    Testicular mass     PAST SURGICAL HISTORY:  Past Surgical History:  Procedure Laterality Date   BACK SURGERY     head surgery     from fall  drilled hole in brain to relieve pressure    HEMATOLOGY/ONCOLOGY HISTORY:  Oncology History  CLL (chronic lymphocytic leukemia)  12/25/2021 - 12/26/2021 Hospital Admission   Patient was admitted due to pain and swelling of right ankle, right knee and left wrist.  Patient was found to have WBC 30.4, with neutrophil 29% and lymphocyte 2%.  Peripheral smear showed leukocytosis with slight left shift in myeloid series and lymphocytosis with abnormal morphology.  Uric acid 5.5, worsening renal function with creatinine 1.35, BUN 18, GFR 55 (baseline creatinine 1.08 on 07/06/2021), lactic acid 1.1.  ESR normal, uric acid 5.5 normal, CRP 22.2, LDH 182  Orthopedic surgery was consulted s/p arthrocentesis, cell count 296 with 72% neutrophil, no crystal. Fluid negative for growth.  Patient received cefepime and vancomycin while in the hospital, Patient was discharged on Keflex for 5 days.   12/28/2021 Cancer Staging   Staging form: Chronic Lymphocytic Leukemia / Small Lymphocytic Lymphoma, AJCC 8th Edition - Clinical stage from 12/28/2021: Modified Rai Stage III (Modified Rai risk: High, Lymphocytosis: Present, Adenopathy: Present, Organomegaly: Absent, Anemia: Present, Thrombocytopenia: Absent) -  Signed by Earlie Server, MD on 01/20/2022 Stage prefix: Initial diagnosis Hemoglobin (Hgb) (g/dL): 10.1   01/01/2022 Initial Diagnosis   CLL (chronic lymphocytic leukemia)   12/27/21 peripheral blood flowcytometry showed Involvement by CD5+, CD23+, CD20+, CD22+ clonal B cell population, phenotype typical for 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    01/05/2022 Imaging   CT chest abdomen pelvis wo contrast 1. Multiple prominent borderline enlarged and mildly enlarged lymph nodes, most evident in the low anatomic pelvis, as above, compatible with reported clinical history of CLL. 2. There also several small pulmonary nodules in the lungs measuring 5 mm or less in size. This is nonspecific, but statistically likely benign. No follow-up needed if patient is low-risk (and has no known or suspected primary neoplasm). Non-contrast chest CT can be considered in 12 months if patient is high-risk. This recommendation 3. Aortic atherosclerosis, in addition to left main and 2 vessel coronary artery disease. Please note that although the presence of coronary artery calcium documents the presence of coronary artery disease, the severity of this disease and any potential stenosis cannot be assessed on this non-gated CT examination. Assessment for  potential risk factor modification, dietary therapy or pharmacologic therapy may be warranted, if clinically indicated. 4. There are calcifications of the aortic valve. Echocardiographic correlation for evaluation of potential valvular dysfunction may be warranted if clinically indicated. 5. 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 6. Diverticulosis without evidence of acute diverticulitis at this time. 7. Mild cardiomegaly.     ALLERGIES:  has No Known Allergies.  MEDICATIONS:  Current  Outpatient Medications  Medication Sig Dispense Refill   calcium-vitamin D (OSCAL WITH D) 500-5 MG-MCG tablet Take 2 tablets by mouth daily. 60 tablet 2   levothyroxine (SYNTHROID) 200 MCG tablet Take 200 mcg by mouth daily.     metFORMIN (GLUCOPHAGE) 500 MG tablet Take 500 mg by mouth 2 (two) times daily with a meal.     mupirocin ointment (BACTROBAN) 2 % Apply 1 Application topically 2 (two) times daily. 22 g 0   potassium chloride (KLOR-CON M) 10 MEQ tablet Take 10 mEq by mouth 2 (two) times daily.     pregabalin (LYRICA) 75 MG capsule Take 1 capsule (75 mg total) by mouth 2 (two) times daily. 60 capsule 2   rosuvastatin (CRESTOR) 10 MG tablet Take 1 tablet by mouth once daily 90 tablet 0   tamsulosin (FLOMAX) 0.4 MG CAPS capsule Take 1 capsule (0.4 mg total) by mouth daily. 90 capsule 3   trospium (SANCTURA) 20 MG tablet Take 1 tablet (20 mg total) by mouth 2 (two) times daily. 60 tablet 0   zanubrutinib (BRUKINSA) 80 MG capsule Take 2 capsules (160 mg total) by mouth 2 (two) times daily. 120 capsule 1   amLODipine (NORVASC) 10 MG tablet Take 10 mg by mouth daily. (Patient not taking: Reported on 04/28/2022)     valsartan-hydrochlorothiazide (DIOVAN-HCT)  320-25 MG tablet Take 1 tablet by mouth daily. (Patient not taking: Reported on 04/28/2022) 90 tablet 3   No current facility-administered medications for this visit.    VITAL SIGNS: BP (!) 156/83   Pulse 65   Temp (!) 97.1 F (36.2 C) (Tympanic)   Resp 18   Wt 230 lb 3.2 oz (104.4 kg)   BMI 30.37 kg/m  Filed Weights   04/28/22 1315  Weight: 230 lb 3.2 oz (104.4 kg)    Estimated body mass index is 30.37 kg/m as calculated from the following:   Height as of 03/30/22: 6\' 1"  (1.854 m).   Weight as of this encounter: 230 lb 3.2 oz (104.4 kg).  LABS: CBC:    Component Value Date/Time   WBC 15.3 (H) 04/28/2022 1233   WBC 16.5 (H) 03/22/2022 0948   HGB 9.8 (L) 04/28/2022 1233   HGB 11.8 (L) 10/06/2013 1441   HCT 30.4 (L) 04/28/2022  1233   HCT 38.9 09/18/2014 1000   PLT 136 (L) 04/28/2022 1233   PLT 295 10/06/2013 1441   MCV 87.4 04/28/2022 1233   MCV 92 10/06/2013 1441   NEUTROABS 3.4 04/28/2022 1233   NEUTROABS 3.3 10/06/2013 1441   LYMPHSABS 10.6 (H) 04/28/2022 1233   LYMPHSABS 2.3 10/06/2013 1441   MONOABS 0.8 04/28/2022 1233   MONOABS 0.4 10/06/2013 1441   EOSABS 0.3 04/28/2022 1233   EOSABS 0.4 10/06/2013 1441   BASOSABS 0.1 04/28/2022 1233   BASOSABS 0.1 10/06/2013 1441   Comprehensive Metabolic Panel:    Component Value Date/Time   NA 137 03/22/2022 0948   NA 139 10/06/2013 1441   K 4.1 03/22/2022 0948   K 4.1 10/06/2013 1441   CL 103 03/22/2022 0948   CL 102 10/06/2013 1441   CO2 26 03/22/2022 0948   CO2 28 10/06/2013 1441   BUN 18 03/22/2022 0948   BUN 27 (H) 10/06/2013 1441   CREATININE 1.33 (H) 03/22/2022 0948   CREATININE 1.08 07/06/2021 1030   GLUCOSE 99 03/22/2022 0948   GLUCOSE 206 (H) 10/06/2013 1441   CALCIUM 8.9 03/22/2022 0948   CALCIUM 9.1 10/06/2013 1441   AST 35 03/22/2022 0948   AST 32 10/06/2013 1441   ALT 22 03/22/2022 0948   ALT 22 10/06/2013 1441   ALKPHOS 58 03/22/2022 0948   ALKPHOS 70 10/06/2013 1441   BILITOT 0.7 03/22/2022 0948   BILITOT 0.8 10/06/2013 1441   PROT 6.8 03/22/2022 0948   PROT 7.9 10/06/2013 1441   ALBUMIN 3.8 03/22/2022 0948   ALBUMIN 3.3 (L) 10/06/2013 1441    RADIOGRAPHIC STUDIES: MR CERVICAL SPINE WO CONTRAST  Result Date: 04/23/2022 CLINICAL DATA:  No known injury, right arm pain and numbness EXAM: MRI CERVICAL SPINE WITHOUT CONTRAST TECHNIQUE: Multiplanar, multisequence MR imaging of the cervical spine was performed. No intravenous contrast was administered. COMPARISON:  CT cervical spine 10/22/2015 FINDINGS: Alignment: Loss of the normal cervical lordosis with mild reversal. No static listhesis. Vertebrae: No acute fracture, evidence of discitis, or aggressive bone lesion. Cord: Normal signal and morphology. Posterior Fossa, vertebral  arteries, paraspinal tissues: Posterior fossa demonstrates no focal abnormality. Vertebral artery flow voids are maintained. Paraspinal soft tissues are unremarkable. Disc levels: Discs: Degenerative disease with disc height loss at C2-3, C3-4, C4-5, C5-6, C6-7 and C7-T1. C2-3: Broad-based disc bulge. Mild left foraminal stenosis. No right foraminal stenosis. No spinal stenosis. Mild bilateral facet arthropathy. C3-4: Broad-based disc bulge with a broad central disc protrusion. Bilateral uncovertebral degenerative changes. Moderate bilateral facet arthropathy.  Moderate-severe bilateral foraminal stenosis. Moderate spinal stenosis. C4-5: Broad-based disc bulge flattening of the ventral cervical spinal cord. Moderate bilateral facet arthropathy. Bilateral uncovertebral degenerative changes. Moderate-severe bilateral foraminal stenosis. C5-6: Broad-based disc bulge with a broad central disc protrusion. Bilateral uncovertebral degenerative changes. Moderate right and severe left foraminal stenosis. Mild spinal stenosis. C6-7: Broad-based disc bulge. Mild left and moderate right foraminal stenosis. No spinal stenosis. C7-T1: Mild broad-based disc bulge. Mild-moderate bilateral foraminal stenosis. No spinal stenosis. IMPRESSION: 1. Diffuse cervical spine spondylosis as described above. 2. No acute osseous injury of the cervical spine. Electronically Signed   By: Kathreen Devoid M.D.   On: 04/23/2022 10:45    PERFORMANCE STATUS (ECOG) : 1 - Symptomatic but completely ambulatory  Review of Systems Unless otherwise noted, a complete review of systems is negative.  Physical Exam General: NAD HEENT: Oral mucosa is dry. Gums are pale.  Cardiovascular: regular rate and rhythm, no peripheral edema visualized  Pulmonary: clear ant fields Extremities: no edema, no joint deformities Skin: no rashes Neurological: Weakness but otherwise nonfocal  Assessment and Plan- Patient is a 77 y.o. male    Encounter Diagnosis   Name Primary?   CLL (chronic lymphocytic leukemia) Yes    CLL: Chronic. Rai stage III CLL. Complicated by unintentional weight loss. Today weight is down about 4 pounds- recently sick which may have played a role. Also appears dehydrated- works outside in the heat and does not hydrate well. We discussed the importance of this. He is otherwise tolerating his medications well. WBC count has trended down a bit to 15. ANC is 3.4, platelets are 136, hemoglobin is 9.8. LDH is elevated from 233 to 340. Given his CLL and recent infection with fatigue I have suggested a chest x ray which he will complete on his own. Discussed imaging locations.   Dizziness/Fatigue: Likely secondary to dehydration and hypotension. Given his outdoor history tick bourne illness was considered however he does not complain of any fever, headaches, rash, body aches, etc. Has known hypotension and dehydration which are likely the cause. I have advised him to stay hydrated with water and reduce energy drinks which may spike his blood pressure. Hydration with water will likely help his blood pressure reduce as well. Today on exam I am concerned that he also may have some iron deficiency which may be playing an additional role given his pallor and Hgb. I will plan to see him back in 2 days and run iron studies/B12/TSH and Vitamin D given his health history and fatigue. Additionally he also has elevations in his creatinine- likely secondary to dehydration however I would like to ensure this is improving given his concurrent elevated blood pressure as additional studies such as a doppler study of the kidneys may be indicated.   Disposition: Amb referral to Cardiology placed Amb referral to Nephrology placed Chest x ray order submitted 1L IVF fluid at 9104ml/hr today Increasing his potassium from 10 MEQ BID to 10 MEQ morning and 20 MEQ evening  RTC Friday follow up with me for elevated creatinine, labs +- 1L IVF  At his next visit he  will need a: CBC w/ diff, CMP, TSH, Iron and TIBC, Ferritin, B12, vitamin D level checked.   If he is found to have IDA he likely will need GI evaluation and IV iron  May benefit from EPO however given his uncontrolled HTN I would be hesitant to start this until his BP is better controlled   Patient expressed understanding and was in  agreement with this plan. He also understands that He can call clinic at any time with any questions, concerns, or complaints.    Thank you for allowing me to participate in the care of this very pleasant patient.   Time Total: 40  Visit consisted of counseling and education dealing with the complex and emotionally intense issues of symptom management in the setting of serious illness.Greater than 50%  of this time was spent counseling and coordinating care related to the above assessment and plan.  Signed by: Nelwyn Salisbury, PA-C

## 2022-04-28 NOTE — Progress Notes (Signed)
Patient here today for follow up, [facility] visit. Patient recently started Zanabrutinib and reports dizziness. Patient has been monitoring blood pressure at home and reports fluctuations over the last few days. He has stopped his blood pressure medications at home.

## 2022-04-28 NOTE — Patient Instructions (Signed)
[facility] at [facility] Address: [address] [place], [place] [address] Hours: 8- 5?PM Phone: [phone]

## 2022-04-29 NOTE — Progress Notes (Signed)
05/02/22 4:57 PM   Paul Ana [name] [birth year] [id number]  Referring provider:  Center, [facility] [address] [place],  [place] [address]  Urological history  1. ED -contributing factors of age, BPH, spinal injury, brain injury, DM, HTN, HLD and sleep apnea -PDE5i's intolerable    2. BPH with LU TS -I PSS 9/3 -PVR 88 mL -tamsulosin 0.4 mg   3. Prostate cancer screening -PSA (09/2021) 0.3 -maternal uncle with fatal prostate cancer -Discontinued screening at this time  Chief Complaint  Patient presents with   Other    pvr     HPI: Paul Fekete [name] is a 77 y.o.male who presents today for follow-up after 1 month trial after starting trospium 20 mg twice daily.   At his visit on 03/30/2022, I PSS 9/3.  PVR 88 mL.  When I asked him if he is taking his tamsulosin 0.4 mg, he said he didn't have any.  According to the [facility], it looks like he picked up a 90 day supply in January 2024.  He is having some episode of urge incontinence.  Patient denies any modifying or aggravating factors.  Patient denies any gross hematuria, dysuria or suprapubic/flank pain.  Patient denies any fevers, chills, nausea or vomiting.  He was advised to continue the tamsulosin 0.4 mg daily and we gave him a trial of trospium 20 mg twice daily.  I PSS 16/3  PVR 35 mL  He continues to have urgency, but he feels the trospium has helped a little.  He is still having to void in a urinal while on job sites and now he has been encouraged to increase his fluids by another provider.    Patient denies any modifying or aggravating factors.  Patient denies any gross hematuria, dysuria or suprapubic/flank pain.  Patient denies any fevers, chills, nausea or vomiting.     IPSS     Row Name 04/30/22 1000         International Prostate Symptom Score   How often have you had the sensation of not emptying your bladder? Less than 1 in 5     How often have you had to urinate less than  every two hours? About half the time     How often have you found you stopped and started again several times when you urinated? More than half the time     How often have you found it difficult to postpone urination? Not at All     How often have you had a weak urinary stream? About half the time     How often have you had to strain to start urination? Less than 1 in 5 times     How many times did you typically get up at night to urinate? 4 Times     Total IPSS Score 16       Quality of Life due to urinary symptoms   If you were to spend the rest of your life with your urinary condition just the way it is now how would you feel about that? Mixed               Score:  1-7 Mild 8-19 Moderate 20-35 Severe  PMH: Past Medical History:  Diagnosis Date   Acute prostatitis    BPH (benign prostatic hyperplasia)    Chronic kidney disease    Dysplasia of prostate    Erectile dysfunction    HTN (hypertension)    Hypogonadism in male  Orchitis and epididymitis    Over weight    Palindromic rheumatism, hand    Prostatitis    Rectum pain    Testicle swelling    Testicle tenderness    Testicular mass     Surgical History: Past Surgical History:  Procedure Laterality Date   BACK SURGERY     head surgery     from fall  drilled hole in brain to relieve pressure    Home Medications:  Allergies as of 04/30/2022   No Known Allergies      Medication List        Accurate as of April 30, 2022 11:59 PM. If you have any questions, ask your nurse or doctor.          STOP taking these medications    amLODipine 10 MG tablet Commonly known as: NORVASC Stopped by: Aldrin Engelhard, PA-C   valsartan-hydrochlorothiazide 320-25 MG tablet Commonly known as: DIOVAN-HCT Stopped by: Michiel Cowboy, PA-C       TAKE these medications    Brukinsa 80 MG capsule Generic drug: zanubrutinib Take 2 capsules (160 mg total) by mouth 2 (two) times daily.   calcium-vitamin D 500-5  MG-MCG tablet Commonly known as: OSCAL WITH D Take 2 tablets by mouth daily.   levothyroxine 200 MCG tablet Commonly known as: SYNTHROID Take 200 mcg by mouth daily.   metFORMIN 500 MG tablet Commonly known as: GLUCOPHAGE Take 500 mg by mouth 2 (two) times daily with a meal.   mupirocin ointment 2 % Commonly known as: BACTROBAN Apply 1 Application topically 2 (two) times daily.   oxyCODONE-acetaminophen 10-325 MG tablet Commonly known as: PERCOCET Take 1 tablet by mouth every 8 (eight) hours as needed.   potassium chloride 10 MEQ tablet Commonly known as: KLOR-CON M Take 1 tablet (10 mEq total) by mouth in the morning.   potassium chloride SA 20 MEQ tablet Commonly known as: KLOR-CON M Take 1 tablet (20 mEq total) by mouth at bedtime.   pregabalin 75 MG capsule Commonly known as: Lyrica Take 1 capsule (75 mg total) by mouth 2 (two) times daily.   rosuvastatin 10 MG tablet Commonly known as: CRESTOR Take 1 tablet by mouth once daily   tamsulosin 0.4 MG Caps capsule Commonly known as: FLOMAX Take 1 capsule (0.4 mg total) by mouth daily.   trospium 20 MG tablet Commonly known as: SANCTURA Take 1 tablet (20 mg total) by mouth 2 (two) times daily.        Allergies:  No Known Allergies  Family History: Family History  Problem Relation Age of Onset   Cancer Maternal Aunt    Prostate cancer Neg Hx    Kidney disease Neg Hx    Kidney cancer Neg Hx    Bladder Cancer Neg Hx     Social History:  reports that he has quit smoking. He has never used smokeless tobacco. He reports that he does not drink alcohol and does not use drugs.   Physical Exam: BP (!) 162/97   Pulse 66   Ht 6\' 1"  (1.854 m)   Wt 234 lb (106.1 kg)   BMI 30.87 kg/m   Constitutional:  Well nourished. Alert and oriented, No acute distress. HEENT: [place] AT, moist mucus membranes.  Trachea midline Cardiovascular: No clubbing, cyanosis, or edema. Respiratory: Normal respiratory effort, no increased  work of breathing. Neurologic: Grossly intact, no focal deficits, moving all 4 extremities. Psychiatric: Normal mood and affect.   Laboratory Data: Serum creatinine (03/2022) 1.53 I  have reviewed the labs.   Pertinent Imaging:  04/30/22 10:30  Scan Result 35ml     Assessment & Plan:    1. BPH with LUTS -most bothersome symptoms is urge incontinence -continue conservative management, avoiding bladder irritants and timed voiding's -Continue tamsulosin 0.4 mg daily  2. Urge incontinence -has some mild improvement with the trospium IR 20 mg twice daily, but he is not tying to increase his fluids which may make the urgency worse -I offered cystoscopy for further evaluation of his symptoms, but he declined -he will continue the trospium IR 20 mg twice daily for now and follow up in one month to evaluation whether or not his increase in fluid intake will worsen the urgency  Return in about 1 month (around 05/30/2022) for IPSS and PVR.  Cloretta Ned   [facility] [address], [address] [place], [place] [address] [phone]

## 2022-04-30 ENCOUNTER — Inpatient Hospital Stay: Payer: No Typology Code available for payment source

## 2022-04-30 ENCOUNTER — Ambulatory Visit (INDEPENDENT_AMBULATORY_CARE_PROVIDER_SITE_OTHER): Payer: No Typology Code available for payment source | Admitting: Urology

## 2022-04-30 ENCOUNTER — Encounter: Payer: Self-pay | Admitting: Urology

## 2022-04-30 ENCOUNTER — Inpatient Hospital Stay (HOSPITAL_BASED_OUTPATIENT_CLINIC_OR_DEPARTMENT_OTHER): Payer: No Typology Code available for payment source | Admitting: Medical Oncology

## 2022-04-30 ENCOUNTER — Telehealth: Payer: Self-pay | Admitting: Medical Oncology

## 2022-04-30 VITALS — BP 162/97 | HR 66 | Ht 73.0 in | Wt 234.0 lb

## 2022-04-30 DIAGNOSIS — N3941 Urge incontinence: Secondary | ICD-10-CM

## 2022-04-30 DIAGNOSIS — N401 Enlarged prostate with lower urinary tract symptoms: Secondary | ICD-10-CM

## 2022-04-30 DIAGNOSIS — N138 Other obstructive and reflux uropathy: Secondary | ICD-10-CM

## 2022-04-30 LAB — BLADDER SCAN AMB NON-IMAGING

## 2022-04-30 NOTE — Telephone Encounter (Signed)
Called to check on patient as he missed his appointment today. He reports is feeling much better and back to his normal self. [facility] follow up visit needed.

## 2022-04-30 NOTE — Progress Notes (Signed)
Erroneous encounter

## 2022-04-30 NOTE — Progress Notes (Signed)
Paul Ana [name] presents for an office/procedure visit. BP today is __high_________. He  is compliant with BP medication. Greater than 140/90. Provider  notified. Pt advised to____seek doctor__________. Pt voiced understanding.

## 2022-05-06 ENCOUNTER — Other Ambulatory Visit (HOSPITAL_COMMUNITY): Payer: Self-pay

## 2022-05-10 ENCOUNTER — Other Ambulatory Visit: Payer: Self-pay

## 2022-05-11 ENCOUNTER — Other Ambulatory Visit (HOSPITAL_COMMUNITY): Payer: Self-pay

## 2022-05-13 ENCOUNTER — Other Ambulatory Visit: Payer: Self-pay

## 2022-05-21 ENCOUNTER — Telehealth: Payer: Self-pay

## 2022-05-21 ENCOUNTER — Inpatient Hospital Stay (HOSPITAL_BASED_OUTPATIENT_CLINIC_OR_DEPARTMENT_OTHER): Payer: No Typology Code available for payment source | Admitting: Oncology

## 2022-05-21 ENCOUNTER — Encounter: Payer: Self-pay | Admitting: Oncology

## 2022-05-21 ENCOUNTER — Inpatient Hospital Stay: Payer: No Typology Code available for payment source

## 2022-05-21 VITALS — BP 157/90 | HR 68 | Temp 96.1°F | Wt 233.6 lb

## 2022-05-21 DIAGNOSIS — R42 Dizziness and giddiness: Secondary | ICD-10-CM

## 2022-05-21 DIAGNOSIS — E876 Hypokalemia: Secondary | ICD-10-CM | POA: Diagnosis not present

## 2022-05-21 DIAGNOSIS — R7401 Elevation of levels of liver transaminase levels: Secondary | ICD-10-CM | POA: Diagnosis not present

## 2022-05-21 DIAGNOSIS — C9110 Chronic lymphocytic leukemia of B-cell type not having achieved remission: Secondary | ICD-10-CM

## 2022-05-21 DIAGNOSIS — R5383 Other fatigue: Secondary | ICD-10-CM

## 2022-05-21 DIAGNOSIS — N1831 Chronic kidney disease, stage 3a: Secondary | ICD-10-CM

## 2022-05-21 DIAGNOSIS — D649 Anemia, unspecified: Secondary | ICD-10-CM

## 2022-05-21 DIAGNOSIS — D509 Iron deficiency anemia, unspecified: Secondary | ICD-10-CM

## 2022-05-21 LAB — CMP (CANCER CENTER ONLY)
ALT: 68 U/L — ABNORMAL HIGH (ref 0–44)
AST: 157 U/L — ABNORMAL HIGH (ref 15–41)
Albumin: 3.9 g/dL (ref 3.5–5.0)
Alkaline Phosphatase: 61 U/L (ref 38–126)
Anion gap: 6 (ref 5–15)
BUN: 17 mg/dL (ref 8–23)
CO2: 25 mmol/L (ref 22–32)
Calcium: 8.6 mg/dL — ABNORMAL LOW (ref 8.9–10.3)
Chloride: 105 mmol/L (ref 98–111)
Creatinine: 1.67 mg/dL — ABNORMAL HIGH (ref 0.61–1.24)
GFR, Estimated: 42 mL/min — ABNORMAL LOW (ref >60)
Glucose, Bld: 102 mg/dL — ABNORMAL HIGH (ref 70–99)
Potassium: 3.5 mmol/L (ref 3.5–5.1)
Sodium: 136 mmol/L (ref 135–145)
Total Bilirubin: 0.7 mg/dL (ref 0.3–1.2)
Total Protein: 6.7 g/dL (ref 6.5–8.1)

## 2022-05-21 LAB — CBC WITH DIFFERENTIAL (CANCER CENTER ONLY)
Abs Immature Granulocytes: 0.02 10*3/uL (ref 0.00–0.07)
Basophils Absolute: 0.1 10*3/uL (ref 0.0–0.1)
Basophils Relative: 0 %
Eosinophils Absolute: 0.2 10*3/uL (ref 0.0–0.5)
Eosinophils Relative: 1 %
HCT: 25.3 % — ABNORMAL LOW (ref 39.0–52.0)
Hemoglobin: 7.9 g/dL — ABNORMAL LOW (ref 13.0–17.0)
Immature Granulocytes: 0 %
Lymphocytes Relative: 63 %
Lymphs Abs: 7.1 10*3/uL — ABNORMAL HIGH (ref 0.7–4.0)
MCH: 27.3 pg (ref 26.0–34.0)
MCHC: 31.2 g/dL (ref 30.0–36.0)
MCV: 87.5 fL (ref 80.0–100.0)
Monocytes Absolute: 0.4 10*3/uL (ref 0.1–1.0)
Monocytes Relative: 4 %
Neutro Abs: 3.6 10*3/uL (ref 1.7–7.7)
Neutrophils Relative %: 32 %
Platelet Count: 145 10*3/uL — ABNORMAL LOW (ref 150–400)
RBC: 2.89 MIL/uL — ABNORMAL LOW (ref 4.22–5.81)
RDW: 17.1 % — ABNORMAL HIGH (ref 11.5–15.5)
Smear Review: NORMAL
WBC Count: 11.3 10*3/uL — ABNORMAL HIGH (ref 4.0–10.5)
nRBC: 0 % (ref 0.0–0.2)

## 2022-05-21 LAB — FERRITIN: Ferritin: 6 ng/mL — ABNORMAL LOW (ref 24–336)

## 2022-05-21 LAB — IRON AND TIBC
Iron: 23 ug/dL — ABNORMAL LOW (ref 45–182)
Saturation Ratios: 6 % — ABNORMAL LOW (ref 17.9–39.5)
TIBC: 378 ug/dL (ref 250–450)
UIBC: 355 ug/dL

## 2022-05-21 LAB — VITAMIN D 25 HYDROXY (VIT D DEFICIENCY, FRACTURES): Vit D, 25-Hydroxy: 30.27 ng/mL (ref 30–100)

## 2022-05-21 NOTE — Telephone Encounter (Signed)
Spoke to pt and explained the need for Iron infusions, pt verbalized understanding.   Please schedule and inform pt of appts:   Iv venofer weekly x4

## 2022-05-21 NOTE — Assessment & Plan Note (Signed)
Patient has been taking potassium supplementation. Potassium 3.5.  Recommend patient to continue potassium chloride 10 mEq daily for 1 week and stop.

## 2022-05-21 NOTE — Progress Notes (Signed)
Hematology/Oncology Progress note [phone] [phone]        ASSESSMENT & PLAN:   Cancer Staging  CLL (chronic lymphocytic leukemia) (HCC) Staging form: Chronic Lymphocytic Leukemia / Small Lymphocytic Lymphoma, AJCC 8th Edition - Clinical stage from 12/28/2021: Modified Rai Stage III (Modified Rai risk: High, Lymphocytosis: Present, Adenopathy: Present, Organomegaly: Absent, Anemia: Present, Thrombocytopenia: Absent) - Signed by [name], MD on 01/20/2022   CLL (chronic lymphocytic leukemia) [facility]) Labs are reviewed and discussed with patient. CT chest abdomen pelvis results reviewed and discussed with patient.   Rai stage III CLL with unintentional weight loss 13q delesion, IgVH unmutated Labs are reviewed and discussed with patient.he tolerates well.  Increased transaminitis, hold off  Zanubrutinib 160 mg twice daily    IDA (iron deficiency anemia) Anemia is likely multifactorial, could be due to CLL, anemia due to chronic kidney disease. Iron levels were reviewed.  Consistent with iron deficiency anemia. I discussed about option of continue oral iron supplementation and repeat blood work for evaluation of treatment response.  If no significant improvement, then proceed with IV Venofer treatments. Alternative option of proceed with IV Venofer treatments. I discussed about the potential risks including but not limited to allergic reactions/infusion reactions including anaphylactic reactions, phlebitis, high blood pressure, wheezing, SOB, skin rash, weight gain,dark urine, leg swelling, back pain, headache, nausea and fatigue, etc. Patient agrees with IV Venofer treatments.  Recommend Venofer weekly x 4  Transaminitis Unknown etiology.  Hold off chemo.  Repeat labs in 1 week.  Hypokalemia Patient has been taking potassium supplementation. Potassium 3.5.  Recommend patient to continue potassium chloride 10 mEq daily for 1 week and stop.  CKD (chronic  kidney disease) stage 3, GFR 30-59 ml/min (HCC) Encourage oral hydration and avoid nephrotoxins.      Orders Placed This Encounter  Procedures   CBC with Differential (Cancer Center Only)    Standing Status:   Future    Standing Expiration Date:   05/21/2023   CMP (Cancer Center only)    Standing Status:   Future    Standing Expiration Date:   05/21/2023   CBC with Differential (Cancer Center Only)    Standing Status:   Future    Standing Expiration Date:   05/21/2023   CMP (Cancer Center only)    Standing Status:   Future    Standing Expiration Date:   05/21/2023   Sample to Blood Bank    Standing Status:   Future    Standing Expiration Date:   05/21/2023    Follow up  2 months.  All questions were answered. The patient knows to call the clinic with any problems, questions or concerns.  [name], MD, PhD [facility] Hematology Oncology 05/21/2022    CHIEF COMPLAINTS/PURPOSE OF CONSULTATION:  CLL  HISTORY OF PRESENTING ILLNESS:  Paul Ana [name] 77 y.o. male presents for follow up of CLL I have reviewed his chart and materials related to his cancer extensively and collaborated history with the patient. Summary of oncologic history is as follows: Oncology History  CLL (chronic lymphocytic leukemia) (HCC)  12/25/2021 - 12/26/2021 Hospital Admission   Patient was admitted due to pain and swelling of right ankle, right knee and left wrist.  Patient was found to have WBC 30.4, with neutrophil 29% and lymphocyte 2%.  Peripheral smear showed leukocytosis with slight left shift in myeloid series and lymphocytosis with abnormal morphology.  Uric acid 5.5, worsening renal function with creatinine 1.35, BUN 18, GFR 55 (baseline  creatinine 1.08 on 07/06/2021), lactic acid 1.1.  ESR normal, uric acid 5.5 normal, CRP 22.2, LDH 182  Orthopedic surgery was consulted s/p arthrocentesis, cell count 296 with 72% neutrophil, no crystal. Fluid negative for growth.  Patient received cefepime  and vancomycin while in the hospital, Patient was discharged on Keflex for 5 days.   12/28/2021 Cancer Staging   Staging form: Chronic Lymphocytic Leukemia / Small Lymphocytic Lymphoma, AJCC 8th Edition - Clinical stage from 12/28/2021: Modified Rai Stage III (Modified Rai risk: High, Lymphocytosis: Present, Adenopathy: Present, Organomegaly: Absent, Anemia: Present, Thrombocytopenia: Absent) - Signed by [name], MD on 01/20/2022 Stage prefix: Initial diagnosis Hemoglobin (Hgb) (g/dL): 16.1   09/30/452 Initial Diagnosis   CLL (chronic lymphocytic leukemia)   12/27/21 peripheral blood flowcytometry showed Involvement by CD5+, CD23+, CD20+, CD22+ clonal B cell population, phenotype typical for 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    01/05/2022 Imaging   CT chest abdomen pelvis wo contrast 1. Multiple prominent borderline enlarged and mildly enlarged lymph nodes, most evident in the low anatomic pelvis, as above, compatible with reported clinical history of CLL. 2. There also several small pulmonary nodules in the lungs measuring 5 mm or less in size. This is nonspecific, but statistically likely benign. No follow-up needed if patient is low-risk (and has no known or suspected primary neoplasm). Non-contrast chest CT can be considered in 12 months if patient is high-risk. This recommendation 3. Aortic atherosclerosis, in addition to left main and 2 vessel coronary artery disease. Please note that although the presence of coronary artery calcium documents the presence of coronary artery disease, the severity of this disease and any potential stenosis cannot be assessed on this non-gated CT examination. Assessment for  potential risk factor modification, dietary therapy or pharmacologic therapy may be warranted, if clinically  indicated. 4. There are calcifications of the aortic valve. Echocardiographic correlation for evaluation of potential valvular dysfunction may be warranted if clinically indicated. 5. 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 6. Diverticulosis without evidence of acute diverticulitis at this time. 7. Mild cardiomegaly.    + Unintentional weight loss INTERVAL HISTORY Cartez Mogle [name] is a 77 y.o. male who has above history reviewed by me today presents for follow up visit for  CLL. He takes Zanubrutinib 160 mg twice daily Tolerates well, appetite is fair. No new complaints.   MEDICAL HISTORY:  Past Medical History:  Diagnosis Date   Acute prostatitis    BPH (benign prostatic hyperplasia)    Chronic kidney disease    Dysplasia of prostate    Erectile dysfunction    HTN (hypertension)    Hypogonadism in male    Orchitis and epididymitis    Over weight    Palindromic rheumatism, hand    Prostatitis    Rectum pain    Testicle swelling    Testicle tenderness    Testicular mass     SURGICAL HISTORY: Past Surgical History:  Procedure Laterality Date   BACK SURGERY     head surgery     from fall  drilled hole in brain to relieve pressure    SOCIAL HISTORY: Social History   Socioeconomic History   Marital status: Married    Spouse name: Not on file   Number of children: Not on file   Years of education: Not on file   Highest education level: Not  on file  Occupational History   Not on file  Tobacco Use   Smoking status: Former   Smokeless tobacco: Never   Tobacco comments:    quit 30 years ago  Vaping Use   Vaping Use: Never used  Substance and Sexual Activity   Alcohol use: No    Alcohol/week: 0.0 standard drinks of alcohol   Drug use: No   Sexual activity: Not on file  Other Topics Concern   Not on file  Social History Narrative   Not on file   Social Determinants of Health   Financial Resource Strain: Low Risk  (01/22/2022)    Overall Financial Resource Strain (CARDIA)    Difficulty of Paying Living Expenses: Not very hard  Food Insecurity: No Food Insecurity (12/25/2021)   Hunger Vital Sign    Worried About Running Out of Food in the Last Year: Never true    Ran Out of Food in the Last Year: Never true  Transportation Needs: No Transportation Needs (01/22/2022)   PRAPARE - Administrator, Civil Service (Medical): No    Lack of Transportation (Non-Medical): No  Physical Activity: Inactive (01/22/2022)   Exercise Vital Sign    Days of Exercise per Week: 0 days    Minutes of Exercise per Session: 0 min  Stress: Stress Concern Present (01/22/2022)   [employer] of Occupational Health - Occupational Stress Questionnaire    Feeling of Stress : To some extent  Social Connections: Socially Integrated (01/22/2022)   Social Connection and Isolation Panel [NHANES]    Frequency of Communication with Friends and Family: More than three times a week    Frequency of Social Gatherings with Friends and Family: Twice a week    Attends Religious Services: 1 to 4 times per year    Active Member of [employer] or Organizations: Yes    Attends Banker Meetings: Never    Marital Status: Married  Catering manager Violence: Not At Risk (01/22/2022)   Humiliation, Afraid, Rape, and Kick questionnaire    Fear of Current or Ex-Partner: No    Emotionally Abused: No    Physically Abused: No    Sexually Abused: No    FAMILY HISTORY: Family History  Problem Relation Age of Onset   Cancer Maternal Aunt    Prostate cancer Neg Hx    Kidney disease Neg Hx    Kidney cancer Neg Hx    Bladder Cancer Neg Hx     ALLERGIES:  has No Known Allergies.  MEDICATIONS:  Current Outpatient Medications  Medication Sig Dispense Refill   amLODipine (NORVASC) 10 MG tablet Take 10 mg by mouth daily.     pregabalin (LYRICA) 75 MG capsule Take 1 capsule (75 mg total) by mouth 2 (two) times daily. 60 capsule 2    rosuvastatin (CRESTOR) 10 MG tablet Take 1 tablet by mouth once daily 90 tablet 0   tamsulosin (FLOMAX) 0.4 MG CAPS capsule Take 1 capsule (0.4 mg total) by mouth daily. 90 capsule 3   trospium (SANCTURA) 20 MG tablet Take 1 tablet (20 mg total) by mouth 2 (two) times daily. 60 tablet 0   zanubrutinib (BRUKINSA) 80 MG capsule Take 2 capsules (160 mg total) by mouth 2 (two) times daily. 120 capsule 1   calcium-vitamin D (OSCAL WITH D) 500-5 MG-MCG tablet Take 2 tablets by mouth daily. (Patient not taking: Reported on 05/21/2022) 60 tablet 2   levothyroxine (SYNTHROID) 200 MCG tablet Take 200 mcg by mouth daily. (Patient  not taking: Reported on 05/21/2022)     metFORMIN (GLUCOPHAGE) 500 MG tablet Take 500 mg by mouth 2 (two) times daily with a meal. (Patient not taking: Reported on 05/21/2022)     mupirocin ointment (BACTROBAN) 2 % Apply 1 Application topically 2 (two) times daily. (Patient not taking: Reported on 05/21/2022) 22 g 0   oxyCODONE-acetaminophen (PERCOCET) 10-325 MG tablet Take 1 tablet by mouth every 8 (eight) hours as needed. (Patient not taking: Reported on 05/21/2022)     potassium chloride (KLOR-CON M) 10 MEQ tablet Take 1 tablet (10 mEq total) by mouth in the morning. 30 tablet 3   valsartan-hydrochlorothiazide (DIOVAN-HCT) 320-12.5 MG tablet Take 1 tablet by mouth daily. (Patient not taking: Reported on 05/21/2022)     No current facility-administered medications for this visit.    Review of Systems  Constitutional:  Positive for fatigue. Negative for appetite change, chills, fever and unexpected weight change.  HENT:   Negative for hearing loss and voice change.   Eyes:  Negative for eye problems and icterus.  Respiratory:  Negative for chest tightness, cough and shortness of breath.   Cardiovascular:  Negative for chest pain and leg swelling.  Gastrointestinal:  Negative for abdominal distention and abdominal pain.  Endocrine: Negative for hot flashes.  Genitourinary:  Negative  for difficulty urinating, dysuria and frequency.   Musculoskeletal:  Positive for arthralgias.  Skin:  Negative for itching and rash.  Neurological:  Negative for light-headedness and numbness.  Hematological:  Negative for adenopathy. Does not bruise/bleed easily.  Psychiatric/Behavioral:  Negative for confusion.      PHYSICAL EXAMINATION: ECOG PERFORMANCE STATUS: 1 - Symptomatic but completely ambulatory  Vitals:   05/21/22 1159  BP: (!) 157/90  Pulse: 68  Temp: (!) 96.1 F (35.6 C)  SpO2: 99%   Filed Weights   05/21/22 1159  Weight: 233 lb 9.6 oz (106 kg)    Physical Exam Constitutional:      General: He is not in acute distress.    Appearance: He is not diaphoretic.  HENT:     Head: Normocephalic and atraumatic.     Nose: Nose normal.     Mouth/Throat:     Pharynx: No oropharyngeal exudate.  Eyes:     General: No scleral icterus.    Pupils: Pupils are equal, round, and reactive to light.  Cardiovascular:     Rate and Rhythm: Normal rate.     Heart sounds: No murmur heard. Pulmonary:     Effort: Pulmonary effort is normal. No respiratory distress.     Breath sounds: No wheezing.  Abdominal:     General: There is no distension.     Palpations: Abdomen is soft.     Tenderness: There is no abdominal tenderness.  Musculoskeletal:     Cervical back: Normal range of motion and neck supple.  Skin:    General: Skin is warm and dry.     Findings: No erythema.  Neurological:     Mental Status: He is alert and oriented to person, place, and time. Mental status is at baseline.     Cranial Nerves: No cranial nerve deficit.     Motor: No abnormal muscle tone.  Psychiatric:        Mood and Affect: Mood and affect normal.      LABORATORY DATA:  I have reviewed the data as listed    Latest Ref Rng & Units 05/21/2022   11:47 AM 04/28/2022   12:33 PM 03/22/2022  9:48 AM  CBC  WBC 4.0 - 10.5 K/uL 11.3  15.3  16.5   Hemoglobin 13.0 - 17.0 g/dL 7.9  9.8  16.1    Hematocrit 39.0 - 52.0 % 25.3  30.4  34.6   Platelets 150 - 400 K/uL 145  136  157       Latest Ref Rng & Units 05/21/2022   11:47 AM 04/28/2022   12:33 PM 03/22/2022    9:48 AM  CMP  Glucose 70 - 99 mg/dL 096  045  99   BUN 8 - 23 mg/dL 17  23  18    Creatinine 0.61 - 1.24 mg/dL 4.09  8.11  9.14   Sodium 135 - 145 mmol/L 136  142  137   Potassium 3.5 - 5.1 mmol/L 3.5  3.3  4.1   Chloride 98 - 111 mmol/L 105  106  103   CO2 22 - 32 mmol/L 25  29  26    Calcium 8.9 - 10.3 mg/dL 8.6  9.0  8.9   Total Protein 6.5 - 8.1 g/dL 6.7  6.8  6.8   Total Bilirubin 0.3 - 1.2 mg/dL 0.7  0.7  0.7   Alkaline Phos 38 - 126 U/L 61  62  58   AST 15 - 41 U/L 157  82  35   ALT 0 - 44 U/L 68  42  22      RADIOGRAPHIC STUDIES: I have personally reviewed the radiological images as listed and agreed with the findings in the report. No results found.

## 2022-05-21 NOTE — Assessment & Plan Note (Signed)
Encourage oral hydration and avoid nephrotoxins.   

## 2022-05-21 NOTE — Assessment & Plan Note (Signed)
Unknown etiology.  Hold off chemo.  Repeat labs in 1 week.

## 2022-05-21 NOTE — Telephone Encounter (Signed)
-----   Message from [name], MD sent at 05/21/2022  2:13 PM EDT ----- Iron level comes back low, explaining why his hemoglobin is low.  Please arrange him to do IV venofer weekly x 4. We discussed about this option during today's visit and he agrees.  Thanks.

## 2022-05-21 NOTE — Progress Notes (Signed)
Patient is unsure about the two potassium medications he is prescribed.

## 2022-05-21 NOTE — Assessment & Plan Note (Addendum)
Labs are reviewed and discussed with patient. CT chest abdomen pelvis results reviewed and discussed with patient.   Rai stage III CLL with unintentional weight loss 13q delesion, IgVH unmutated Labs are reviewed and discussed with patient.he tolerates well.  Increased transaminitis, hold off  Zanubrutinib 160 mg twice daily

## 2022-05-21 NOTE — Assessment & Plan Note (Addendum)
Anemia is likely multifactorial, could be due to CLL, anemia due to chronic kidney disease. Iron levels were reviewed.  Consistent with iron deficiency anemia. I discussed about option of continue oral iron supplementation and repeat blood work for evaluation of treatment response.  If no significant improvement, then proceed with IV Venofer treatments. Alternative option of proceed with IV Venofer treatments. I discussed about the potential risks including but not limited to allergic reactions/infusion reactions including anaphylactic reactions, phlebitis, high blood pressure, wheezing, SOB, skin rash, weight gain,dark urine, leg swelling, back pain, headache, nausea and fatigue, etc. Patient agrees with IV Venofer treatments.  Recommend Venofer weekly x 4

## 2022-05-22 LAB — RPR: RPR Ser Ql: NONREACTIVE

## 2022-05-25 ENCOUNTER — Ambulatory Visit
Payer: No Typology Code available for payment source | Attending: Student in an Organized Health Care Education/Training Program | Admitting: Student in an Organized Health Care Education/Training Program

## 2022-05-25 ENCOUNTER — Encounter: Payer: Self-pay | Admitting: Student in an Organized Health Care Education/Training Program

## 2022-05-25 VITALS — BP 155/88 | HR 58 | Temp 98.1°F | Ht 73.0 in | Wt 229.0 lb

## 2022-05-25 DIAGNOSIS — M5416 Radiculopathy, lumbar region: Secondary | ICD-10-CM | POA: Diagnosis not present

## 2022-05-25 DIAGNOSIS — M5412 Radiculopathy, cervical region: Secondary | ICD-10-CM | POA: Diagnosis not present

## 2022-05-25 DIAGNOSIS — M48062 Spinal stenosis, lumbar region with neurogenic claudication: Secondary | ICD-10-CM | POA: Insufficient documentation

## 2022-05-25 DIAGNOSIS — M542 Cervicalgia: Secondary | ICD-10-CM | POA: Insufficient documentation

## 2022-05-25 DIAGNOSIS — G8929 Other chronic pain: Secondary | ICD-10-CM

## 2022-05-25 DIAGNOSIS — G894 Chronic pain syndrome: Secondary | ICD-10-CM

## 2022-05-25 MED ORDER — OXYCODONE-ACETAMINOPHEN 10-325 MG PO TABS
1.0000 | ORAL_TABLET | Freq: Three times a day (TID) | ORAL | 0 refills | Status: DC | PRN
Start: 2022-07-24 — End: 2022-08-19

## 2022-05-25 MED ORDER — OXYCODONE-ACETAMINOPHEN 10-325 MG PO TABS
1.0000 | ORAL_TABLET | Freq: Three times a day (TID) | ORAL | 0 refills | Status: DC | PRN
Start: 2022-06-24 — End: 2022-07-24

## 2022-05-25 MED ORDER — PREGABALIN 100 MG PO CAPS: 100.0000 mg | ORAL_CAPSULE | Freq: Two times a day (BID) | ORAL | 5 refills | Status: DC

## 2022-05-25 MED ORDER — OXYCODONE-ACETAMINOPHEN 10-325 MG PO TABS
1.0000 | ORAL_TABLET | Freq: Three times a day (TID) | ORAL | 0 refills | Status: DC | PRN
Start: 2022-05-25 — End: 2022-06-24

## 2022-05-25 NOTE — Progress Notes (Signed)
PROVIDER NOTE: Information contained herein reflects review and annotations entered in association with encounter. Interpretation of such information and data should be left to medically-trained personnel. Information provided to patient can be located elsewhere in the medical record under "Patient Instructions". Document created using STT-dictation technology, any transcriptional errors that may result from process are unintentional.    Patient: Paul Ana [name]  Service Category: E/M  Provider: Edward Jolly, MD  DOB: [birth year]  DOS: 05/25/2022  Specialty: Interventional Pain Management  MRN: [id number]  Setting: Ambulatory outpatient  PCP: Patient, No Pcp Per  Type: Established Patient    Referring Provider: [facility]  Location: Office  Delivery: Face-to-face     HPI  [name], a 77 y.o. year old male, is here today because of his Cervical radicular pain [M54.12]. Mr. Marczak primary complain today is Back Pain (Lower and mid) Last encounter: My last encounter with him was on 02/25/22 Pertinent problems: Mr. Marhefka has Chronic back pain; Disorder of rotator cuff, left; Primary osteoarthritis of left shoulder; Left rotator cuff tear arthropathy; Spinal stenosis, lumbar region, with neurogenic claudication; Chronic left shoulder pain; Chronic pain syndrome; and Pain management contract signed on their pertinent problem list. Pain Assessment: Severity of Chronic pain is reported as a 10-Worst pain ever/10. Location: Back Lower, Mid/radiates to calf. Onset: More than a month ago. Quality: Aching, Burning, Constant. Timing: Constant. Modifying factor(s): brace, meds. Vitals:  height is 6\' 1"  (1.854 m) and weight is 229 lb (103.9 kg). His temporal temperature is 98.1 F (36.7 C). His blood pressure is 155/88 (abnormal) and his pulse is 58 (abnormal). His oxygen saturation is 100%.   Reason for encounter: medication management, bilateral shoulder pain,  cervical spine pain with radiation into bilateral hands and fingers, right greater than left.  Also low back pain with radiation into bilateral legs as well as mild feet swelling.  Chronic low back and leg pain related to lumbar spinal stenosis and neurogenic claudication- need further work up via L-MRI.  Continues oxycodone 10 mg 3 times a day as needed.  He continues to work in [employer]. He unfortunately, he was also diagnosed with chronic lymphocytic leukemia and is undergoing treatment with Dr. Cathie Hoops He is complaining of bilateral shoulder pain, neck pain with radiation into bilateral hands with associated numbness and tingling in a dermatomal fashion.  Patient has completed cervical MRI, results are below however in summary they show cervical facet arthropathy, multilevel cervical neuroforaminal stenosis. He is tolerating Lyrica at 75 mg twice daily without any side effects, recommend that he increase to 100 mg twice daily. Patient is wearing a back brace.   Pharmacotherapy Assessment  Analgesic: Percocet 10 mg 3 times daily as needed, quantity 90/month   Monitoring: [place] PMP: PDMP reviewed during this encounter.       Pharmacotherapy: No side-effects or adverse reactions reported. Compliance: No problems identified. Effectiveness: Clinically acceptable.  Florina Ou, RN  05/25/2022 10:44 AM  Sign when Signing Visit Nursing Pain Medication Assessment:  Safety precautions to be maintained throughout the outpatient stay will include: orient to surroundings, keep bed in low position, maintain call bell within reach at all times, provide assistance with transfer out of bed and ambulation.  Medication Inspection Compliance: Pill count conducted under aseptic conditions, in front of the patient. Neither the pills nor the bottle was removed from the patient's sight at any time. Once count was completed pills were immediately returned to the patient in their original bottle.  Medication:  Oxycodone/APAP Pill/Patch Count:  0 of 90 pills remain Pill/Patch Appearance:  no pills to count Bottle Appearance: Standard pharmacy container. Clearly labeled. Filled Date: 03 / 6 / 2024 Last Medication intake:  YesterdaySafety precautions to be maintained throughout the outpatient stay will include: orient to surroundings, keep bed in low position, maintain call bell within reach at all times, provide assistance with transfer out of bed and ambulation.     UDS:  Summary  Date Value Ref Range Status  12/01/2021 Note  Final    Comment:    ==================================================================== [employer] [facility] (MW) ==================================================================== Test                             Result       Flag       Units  Drug Present and Declared for Prescription Verification   Oxycodone                      998          EXPECTED   ng/mg creat   Oxymorphone                    739          EXPECTED   ng/mg creat   Noroxymorphone                 322          EXPECTED   ng/mg creat    Sources of oxycodone include scheduled prescription medications.    Oxymorphone and noroxymorphone are expected metabolites of oxycodone.    Oxymorphone is also available as a scheduled prescription    medication.  ==================================================================== Test                      Result    Flag   Units      Ref Range   Creatinine              220              mg/dL      >=16 ==================================================================== Declared Medications:  The flagging and interpretation on this report are based on the  following declared medications.  Unexpected results may arise from  inaccuracies in the declared medications.   **Note: The testing scope of this panel includes these medications:   Oxycodone (Percocet)   **Note: The testing scope of this panel does not include the  following reported medications:    Acetaminophen (Percocet)  Aspirin  Bisacodyl  Caffeine  Hydrochlorothiazide  Levothyroxine  Rosuvastatin  Supplement  Tamsulosin  Valsartan ==================================================================== For clinical consultation, please call [phone]. ====================================================================      ROS  Constitutional: Denies any fever or chills Gastrointestinal: No reported hemesis, hematochezia, vomiting, or acute GI distress Musculoskeletal:  Cervical spine pain with radiation into bilateral hands, right greater than left, bilateral shoulder pain left greater than right low back and bilateral leg pain Neurological: No reported episodes of acute onset apraxia, aphasia, dysarthria, agnosia, amnesia, paralysis, loss of coordination, or loss of consciousness  Medication Review  amLODipine, levothyroxine, metFORMIN, oxyCODONE-acetaminophen, potassium chloride, pregabalin, rosuvastatin, tamsulosin, trospium, and zanubrutinib  History Review  Allergy: Mr. Welcher has No Known Allergies. Drug: Mr. Matlack  reports no history of drug use. Alcohol:  reports no history of alcohol use. Tobacco:  reports that he has quit smoking. He has never used  smokeless tobacco. Social: Mr. Bertino  reports that he has quit smoking. He has never used smokeless tobacco. He reports that he does not drink alcohol and does not use drugs. Medical:  has a past medical history of Acute prostatitis, BPH (benign prostatic hyperplasia), Chronic kidney disease, Dysplasia of prostate, Erectile dysfunction, HTN (hypertension), Hypogonadism in male, Orchitis and epididymitis, Over weight, Palindromic rheumatism, hand, Prostatitis, Rectum pain, Testicle swelling, Testicle tenderness, and Testicular mass. Surgical: Mr. Loden  has a past surgical history that includes Back surgery and head surgery. Family: family history includes Cancer in his maternal aunt.  Laboratory  Chemistry Profile   Renal Lab Results  Component Value Date   BUN 17 05/21/2022   CREATININE 1.67 (H) 05/21/2022   BCR NOT APPLICABLE 07/06/2021   GFRAA >60 10/22/2015   GFRNONAA 42 (L) 05/21/2022    Hepatic Lab Results  Component Value Date   AST 157 (H) 05/21/2022   ALT 68 (H) 05/21/2022   ALBUMIN 3.9 05/21/2022   ALKPHOS 61 05/21/2022   HCVAB NON REACTIVE 12/26/2021   LIPASE 357 02/25/2012    Electrolytes Lab Results  Component Value Date   NA 136 05/21/2022   K 3.5 05/21/2022   CL 105 05/21/2022   CALCIUM 8.6 (L) 05/21/2022    Bone Lab Results  Component Value Date   VD25OH 30.27 05/21/2022   TESTOSTERONE 429 04/19/2017    Inflammation (CRP: Acute Phase) (ESR: Chronic Phase) Lab Results  Component Value Date   CRP 22.2 (H) 12/25/2021   ESRSEDRATE 3 12/25/2021   LATICACIDVEN 2.1 (HH) 12/25/2021         Note: Above Lab results reviewed.  CLINICAL DATA:  No known injury, right arm pain and numbness   EXAM: MRI CERVICAL SPINE WITHOUT CONTRAST   TECHNIQUE: Multiplanar, multisequence MR imaging of the cervical spine was performed. No intravenous contrast was administered.   COMPARISON:  CT cervical spine 10/22/2015   FINDINGS: Alignment: Loss of the normal cervical lordosis with mild reversal. No static listhesis.   Vertebrae: No acute fracture, evidence of discitis, or aggressive bone lesion.   Cord: Normal signal and morphology.   Posterior Fossa, vertebral arteries, paraspinal tissues: Posterior fossa demonstrates no focal abnormality. Vertebral artery flow voids are maintained. Paraspinal soft tissues are unremarkable.   Disc levels:   Discs: Degenerative disease with disc height loss at C2-3, C3-4, C4-5, C5-6, C6-7 and C7-T1.   C2-3: Broad-based disc bulge. Mild left foraminal stenosis. No right foraminal stenosis. No spinal stenosis. Mild bilateral facet arthropathy.   C3-4: Broad-based disc bulge with a broad central disc  protrusion. Bilateral uncovertebral degenerative changes. Moderate bilateral facet arthropathy. Moderate-severe bilateral foraminal stenosis. Moderate spinal stenosis.   C4-5: Broad-based disc bulge flattening of the ventral cervical spinal cord. Moderate bilateral facet arthropathy. Bilateral uncovertebral degenerative changes. Moderate-severe bilateral foraminal stenosis.   C5-6: Broad-based disc bulge with a broad central disc protrusion. Bilateral uncovertebral degenerative changes. Moderate right and severe left foraminal stenosis. Mild spinal stenosis.   C6-7: Broad-based disc bulge. Mild left and moderate right foraminal stenosis. No spinal stenosis.   C7-T1: Mild broad-based disc bulge. Mild-moderate bilateral foraminal stenosis. No spinal stenosis.   IMPRESSION: 1. Diffuse cervical spine spondylosis as described above. 2. No acute osseous injury of the cervical spine.     Electronically Signed   By: Elige Ko M.D.   On: 04/23/2022 10:45  Physical Exam  General appearance: Well nourished, well developed, and well hydrated. In no apparent acute distress Mental status: Alert,  oriented x 3 (person, place, & time)       Respiratory: No evidence of acute respiratory distress Eyes: PERLA Vitals: BP (!) 155/88   Pulse (!) 58   Temp 98.1 F (36.7 C) (Temporal)   Ht 6\' 1"  (1.854 m)   Wt 229 lb (103.9 kg)   SpO2 100%   BMI 30.21 kg/m  BMI: Estimated body mass index is 30.21 kg/m as calculated from the following:   Height as of this encounter: 6\' 1"  (1.854 m).   Weight as of this encounter: 229 lb (103.9 kg). Ideal: Ideal body weight: 79.9 kg (176 lb 2.4 oz) Adjusted ideal body weight: 89.5 kg (197 lb 4.6 oz)  Cervical Spine Area Exam  Skin & Axial Inspection: No masses, redness, edema, swelling, or associated skin lesions Alignment: Symmetrical Functional ROM: Pain restricted ROM, bilaterally Stability: No instability detected Muscle Tone/Strength: Functionally  intact. No obvious neuro-muscular anomalies detected. Sensory (Neurological): Dermatomal pain pattern Palpation: No palpable anomalies              Upper Extremity (UE) Exam    Side: Right upper extremity  Side: Left upper extremity  Skin & Extremity Inspection: Skin color, temperature, and hair growth are WNL. No peripheral edema or cyanosis. No masses, redness, swelling, asymmetry, or associated skin lesions. No contractures.  Skin & Extremity Inspection: Skin color, temperature, and hair growth are WNL. No peripheral edema or cyanosis. No masses, redness, swelling, asymmetry, or associated skin lesions. No contractures.  Functional ROM: Pain restricted ROM for shoulder  Functional ROM: Pain restricted ROM for shoulder  Muscle Tone/Strength: Functionally intact. No obvious neuro-muscular anomalies detected.  Muscle Tone/Strength: Functionally intact. No obvious neuro-muscular anomalies detected.  Sensory (Neurological):Arthropathic arthralgia and possibly dermatomal  Sensory (Neurological): Arthropathic arthralgia and possibly dermatomal          Palpation: No palpable anomalies              Palpation: No palpable anomalies              Provocative Test(s):  Phalen's test: deferred Tinel's test: deferred Apley's scratch test (touch opposite shoulder):  Action 1 (Across chest): Decreased ROM Action 2 (Overhead): Decreased ROM Action 3 (LB reach): Decreased ROM   Provocative Test(s):  Phalen's test: deferred Tinel's test: deferred Apley's scratch test (touch opposite shoulder):  Action 1 (Across chest): Decreased ROM Action 2 (Overhead): Decreased ROM Action 3 (LB reach): Decreased ROM      Lumbar Spine Area Exam  Skin & Axial Inspection: No masses, redness, or swelling Alignment: Symmetrical Functional ROM: Pain restricted ROM       Stability: No instability detected Muscle Tone/Strength: Functionally intact. No obvious neuro-muscular anomalies detected. Sensory (Neurological):  Neurogenic pain pattern   Gait & Posture Assessment  Ambulation: Unassisted Gait: Relatively normal for age and body habitus Posture: WNL   Lower Extremity Exam      Side: Right lower extremity   Side: Left lower extremity  Stability: No instability observed           Stability: No instability observed          Skin & Extremity Inspection: Skin color, temperature, and hair growth are WNL. No peripheral edema or cyanosis. No masses, redness, swelling, asymmetry, or associated skin lesions. No contractures.   Skin & Extremity Inspection: Skin color, temperature, and hair growth are WNL. No peripheral edema or cyanosis. No masses, redness, swelling, asymmetry, or associated skin lesions. No contractures.  Functional ROM: Pain restricted ROM for  hip and knee joints           Functional ROM: Pain restricted ROM for hip and knee joints          Muscle Tone/Strength: Functionally intact. No obvious neuro-muscular anomalies detected.   Muscle Tone/Strength: Functionally intact. No obvious neuro-muscular anomalies detected.  Sensory (Neurological): Neurogenic pain pattern         Sensory (Neurological): Neurogenic pain pattern        DTR: Patellar: deferred today Achilles: deferred today Plantar: deferred today   DTR: Patellar: deferred today Achilles: deferred today Plantar: deferred today  Palpation: No palpable anomalies   Palpation: No palpable anomalies     Assessment   Diagnosis Status  1. Cervical radicular pain   2. Cervicalgia   3. Lumbar radicular pain   4. Spinal stenosis, lumbar region, with neurogenic claudication   5. Chronic radicular lumbar pain   6. Chronic pain syndrome     Persistent Persistent Persistent     Plan of Care    [name] has a current medication list which includes the following long-term medication(s): levothyroxine, potassium chloride, pregabalin, and rosuvastatin.  1. Cervical radicular pain - oxyCODONE-acetaminophen  (PERCOCET) 10-325 MG tablet; Take 1 tablet by mouth every 8 (eight) hours as needed.  Dispense: 90 tablet; Refill: 0 - oxyCODONE-acetaminophen (PERCOCET) 10-325 MG tablet; Take 1 tablet by mouth every 8 (eight) hours as needed.  Dispense: 90 tablet; Refill: 0 - oxyCODONE-acetaminophen (PERCOCET) 10-325 MG tablet; Take 1 tablet by mouth every 8 (eight) hours as needed.  Dispense: 90 tablet; Refill: 0 - Cervical Epidural Injection; Future  2. Cervicalgia - oxyCODONE-acetaminophen (PERCOCET) 10-325 MG tablet; Take 1 tablet by mouth every 8 (eight) hours as needed.  Dispense: 90 tablet; Refill: 0 - oxyCODONE-acetaminophen (PERCOCET) 10-325 MG tablet; Take 1 tablet by mouth every 8 (eight) hours as needed.  Dispense: 90 tablet; Refill: 0 - oxyCODONE-acetaminophen (PERCOCET) 10-325 MG tablet; Take 1 tablet by mouth every 8 (eight) hours as needed.  Dispense: 90 tablet; Refill: 0 - Cervical Epidural Injection; Future  3. Lumbar radicular pain - oxyCODONE-acetaminophen (PERCOCET) 10-325 MG tablet; Take 1 tablet by mouth every 8 (eight) hours as needed.  Dispense: 90 tablet; Refill: 0 - oxyCODONE-acetaminophen (PERCOCET) 10-325 MG tablet; Take 1 tablet by mouth every 8 (eight) hours as needed.  Dispense: 90 tablet; Refill: 0 - oxyCODONE-acetaminophen (PERCOCET) 10-325 MG tablet; Take 1 tablet by mouth every 8 (eight) hours as needed.  Dispense: 90 tablet; Refill: 0 - MR LUMBAR SPINE WO CONTRAST; Future  4. Spinal stenosis, lumbar region, with neurogenic claudication - oxyCODONE-acetaminophen (PERCOCET) 10-325 MG tablet; Take 1 tablet by mouth every 8 (eight) hours as needed.  Dispense: 90 tablet; Refill: 0 - oxyCODONE-acetaminophen (PERCOCET) 10-325 MG tablet; Take 1 tablet by mouth every 8 (eight) hours as needed.  Dispense: 90 tablet; Refill: 0 - oxyCODONE-acetaminophen (PERCOCET) 10-325 MG tablet; Take 1 tablet by mouth every 8 (eight) hours as needed.  Dispense: 90 tablet; Refill: 0 - MR LUMBAR SPINE  WO CONTRAST; Future  5. Chronic radicular lumbar pain - oxyCODONE-acetaminophen (PERCOCET) 10-325 MG tablet; Take 1 tablet by mouth every 8 (eight) hours as needed.  Dispense: 90 tablet; Refill: 0 - oxyCODONE-acetaminophen (PERCOCET) 10-325 MG tablet; Take 1 tablet by mouth every 8 (eight) hours as needed.  Dispense: 90 tablet; Refill: 0 - oxyCODONE-acetaminophen (PERCOCET) 10-325 MG tablet; Take 1 tablet by mouth every 8 (eight) hours as needed.  Dispense: 90 tablet; Refill: 0 - MR LUMBAR  SPINE WO CONTRAST; Future  6. Chronic pain syndrome - oxyCODONE-acetaminophen (PERCOCET) 10-325 MG tablet; Take 1 tablet by mouth every 8 (eight) hours as needed.  Dispense: 90 tablet; Refill: 0 - oxyCODONE-acetaminophen (PERCOCET) 10-325 MG tablet; Take 1 tablet by mouth every 8 (eight) hours as needed.  Dispense: 90 tablet; Refill: 0 - oxyCODONE-acetaminophen (PERCOCET) 10-325 MG tablet; Take 1 tablet by mouth every 8 (eight) hours as needed.  Dispense: 90 tablet; Refill: 0 - MR LUMBAR SPINE WO CONTRAST; Future    Pharmacotherapy (Medications Ordered): Meds ordered this encounter  Medications   oxyCODONE-acetaminophen (PERCOCET) 10-325 MG tablet    Sig: Take 1 tablet by mouth every 8 (eight) hours as needed.    Dispense:  90 tablet    Refill:  0   oxyCODONE-acetaminophen (PERCOCET) 10-325 MG tablet    Sig: Take 1 tablet by mouth every 8 (eight) hours as needed.    Dispense:  90 tablet    Refill:  0   oxyCODONE-acetaminophen (PERCOCET) 10-325 MG tablet    Sig: Take 1 tablet by mouth every 8 (eight) hours as needed.    Dispense:  90 tablet    Refill:  0   pregabalin (LYRICA) 100 MG capsule    Sig: Take 1 capsule (100 mg total) by mouth 2 (two) times daily.    Dispense:  60 capsule    Refill:  5    Fill one day early if pharmacy is closed on scheduled refill date.    Orders:  Orders Placed This Encounter  Procedures   Cervical Epidural Injection    Sedation: IV Versed Purpose:  Diagnostic/Therapeutic Indication(s): Radiculitis and cervicalgia associater with cervical degenerative disc disease.    Standing Status:   Future    Standing Expiration Date:   08/24/2022    Scheduling Instructions:     Procedure: Cervical Epidural Steroid Injection/Block     Level(s): C7-T1     Laterality: TBD     Timeframe: As soon as schedule allows    Order Specific Question:   Where will this procedure be performed?    Answer:   [facility]    Comments:   Yaviel Kloster   MR LUMBAR SPINE WO CONTRAST    Patient presents with axial pain with possible radicular component. Please assist [place] in identifying specific level(s) and laterality of any additional findings such as: 1. Facet (Zygapophyseal) joint DJD (Hypertrophy, space narrowing, subchondral sclerosis, and/or osteophyte formation) 2. DDD and/or IVDD (Loss of disc height, desiccation, gas patterns, osteophytes, endplate sclerosis, or "Black disc disease") 3. Pars defects 4. Spondylolisthesis, spondylosis, and/or spondyloarthropathies (include Degree/Grade of displacement in mm) (stability) 5. Vertebral body Fractures (acute/chronic) (state percentage of collapse) 6. Demineralization (osteopenia/osteoporotic) 7. Bone pathology 8. Foraminal narrowing  9. Surgical changes 10. Central, Lateral Recess, and/or Foraminal Stenosis (include AP diameter of stenosis in mm) 11. Surgical changes (hardware type, status, and presence of fibrosis) 12. Modic Type Changes (MRI only) 13. IVDD (Disc bulge, protrusion, herniation, extrusion) (Level, laterality, extent)    Standing Status:   Future    Standing Expiration Date:   06/24/2022    Scheduling Instructions:     Please make sure that the patient understands that this needs to be done as soon as possible. Never have the patient do the imaging "just before the next appointment". Inform patient that having the imaging done within the [facility] will expedite the availability of the results and  will provide      imaging availability to the requesting  physician. In addition inform the patient that the imaging order has an expiration date and will not be renewed if not done within the active period.    Order Specific Question:   What is the patient's sedation requirement?    Answer:   No Sedation    Order Specific Question:   Does the patient have a pacemaker or implanted devices?    Answer:   No    Order Specific Question:   Preferred imaging location?    Answer:   [facility] (table limit-350lbs)    Order Specific Question:   Call Results- Best Contact Number?    Answer:   [phone] [facility])    Order Specific Question:   Radiology Contrast Protocol - do NOT remove file path    Answer:   [email or web address]   Follow-up plan:   Return in about 20 days (around 06/14/2022) for C-ESi , in clinic IV Versed.    Recent Visits Date Type Provider Dept  02/25/22 Office Visit Edward Jolly, MD [facility]  Showing recent visits within past 90 days and meeting all other requirements Today's Visits Date Type Provider Dept  05/25/22 Office Visit Edward Jolly, MD [facility]  Showing today's visits and meeting all other requirements Future Appointments Date Type Provider Dept  08/19/22 Appointment Edward Jolly, MD [facility]  Showing future appointments within next 90 days and meeting all other requirements  I discussed the assessment and treatment plan with the patient. The patient was provided an opportunity to ask questions and all were answered. The patient agreed with the plan and demonstrated an understanding of the instructions.  Patient advised to call back or seek an in-person evaluation if the symptoms or condition worsens.  Duration of encounter: 30 minutes.  Note by: Edward Jolly, MD Date: 05/25/2022; Time: 3:16 PM

## 2022-05-25 NOTE — Progress Notes (Signed)
Nursing Pain Medication Assessment:  Safety precautions to be maintained throughout the outpatient stay will include: orient to surroundings, keep bed in low position, maintain call bell within reach at all times, provide assistance with transfer out of bed and ambulation.  Medication Inspection Compliance: Pill count conducted under aseptic conditions, in front of the patient. Neither the pills nor the bottle was removed from the patient's sight at any time. Once count was completed pills were immediately returned to the patient in their original bottle.  Medication: Oxycodone/APAP Pill/Patch Count:  0 of 90 pills remain Pill/Patch Appearance:  no pills to count Bottle Appearance: Standard pharmacy container. Clearly labeled. Filled Date: 03 / 6 / 2024 Last Medication intake:  YesterdaySafety precautions to be maintained throughout the outpatient stay will include: orient to surroundings, keep bed in low position, maintain call bell within reach at all times, provide assistance with transfer out of bed and ambulation.

## 2022-05-26 MED FILL — Iron Sucrose Inj 20 MG/ML (Fe Equiv): INTRAVENOUS | Qty: 10 | Status: AC

## 2022-05-27 ENCOUNTER — Inpatient Hospital Stay: Payer: No Typology Code available for payment source | Attending: Oncology

## 2022-05-27 ENCOUNTER — Other Ambulatory Visit: Payer: Self-pay

## 2022-05-27 DIAGNOSIS — D509 Iron deficiency anemia, unspecified: Secondary | ICD-10-CM

## 2022-05-27 DIAGNOSIS — C9110 Chronic lymphocytic leukemia of B-cell type not having achieved remission: Secondary | ICD-10-CM | POA: Insufficient documentation

## 2022-05-28 ENCOUNTER — Inpatient Hospital Stay: Payer: No Typology Code available for payment source

## 2022-05-28 ENCOUNTER — Other Ambulatory Visit: Payer: Self-pay

## 2022-05-28 ENCOUNTER — Other Ambulatory Visit: Payer: Self-pay | Admitting: Oncology

## 2022-05-28 VITALS — BP 136/75 | HR 68 | Temp 99.2°F

## 2022-05-28 DIAGNOSIS — D649 Anemia, unspecified: Secondary | ICD-10-CM

## 2022-05-28 DIAGNOSIS — C9110 Chronic lymphocytic leukemia of B-cell type not having achieved remission: Secondary | ICD-10-CM

## 2022-05-28 DIAGNOSIS — D509 Iron deficiency anemia, unspecified: Secondary | ICD-10-CM

## 2022-05-28 LAB — CMP (CANCER CENTER ONLY)
ALT: 76 U/L — ABNORMAL HIGH (ref 0–44)
AST: 148 U/L — ABNORMAL HIGH (ref 15–41)
Albumin: 3.7 g/dL (ref 3.5–5.0)
Alkaline Phosphatase: 63 U/L (ref 38–126)
Anion gap: 8 (ref 5–15)
BUN: 25 mg/dL — ABNORMAL HIGH (ref 8–23)
CO2: 27 mmol/L (ref 22–32)
Calcium: 8.5 mg/dL — ABNORMAL LOW (ref 8.9–10.3)
Chloride: 107 mmol/L (ref 98–111)
Creatinine: 1.38 mg/dL — ABNORMAL HIGH (ref 0.61–1.24)
GFR, Estimated: 53 mL/min — ABNORMAL LOW (ref >60)
Glucose, Bld: 134 mg/dL — ABNORMAL HIGH (ref 70–99)
Potassium: 3.8 mmol/L (ref 3.5–5.1)
Sodium: 142 mmol/L (ref 135–145)
Total Bilirubin: 0.6 mg/dL (ref 0.3–1.2)
Total Protein: 6.6 g/dL (ref 6.5–8.1)

## 2022-05-28 LAB — CBC WITH DIFFERENTIAL/PLATELET
Abs Immature Granulocytes: 0.02 10*3/uL (ref 0.00–0.07)
Basophils Absolute: 0.1 10*3/uL (ref 0.0–0.1)
Basophils Relative: 1 %
Eosinophils Absolute: 0.6 10*3/uL — ABNORMAL HIGH (ref 0.0–0.5)
Eosinophils Relative: 6 %
HCT: 24.2 % — ABNORMAL LOW (ref 39.0–52.0)
Hemoglobin: 7.4 g/dL — ABNORMAL LOW (ref 13.0–17.0)
Immature Granulocytes: 0 %
Lymphocytes Relative: 44 %
Lymphs Abs: 4.2 10*3/uL — ABNORMAL HIGH (ref 0.7–4.0)
MCH: 26.8 pg (ref 26.0–34.0)
MCHC: 30.6 g/dL (ref 30.0–36.0)
MCV: 87.7 fL (ref 80.0–100.0)
Monocytes Absolute: 0.7 10*3/uL (ref 0.1–1.0)
Monocytes Relative: 8 %
Neutro Abs: 3.8 10*3/uL (ref 1.7–7.7)
Neutrophils Relative %: 41 %
Platelets: 165 10*3/uL (ref 150–400)
RBC: 2.76 MIL/uL — ABNORMAL LOW (ref 4.22–5.81)
RDW: 16.9 % — ABNORMAL HIGH (ref 11.5–15.5)
Smear Review: NORMAL
WBC: 9.5 10*3/uL (ref 4.0–10.5)
nRBC: 0 % (ref 0.0–0.2)

## 2022-05-28 LAB — TYPE AND SCREEN
ABO/RH(D): O POS
Antibody Screen: NEGATIVE

## 2022-05-28 LAB — PREPARE RBC (CROSSMATCH)

## 2022-05-28 LAB — SAMPLE TO BLOOD BANK

## 2022-05-28 LAB — VITAMIN B12: Vitamin B-12: 287 pg/mL (ref 180–914)

## 2022-05-28 LAB — FOLATE: Folate: 10.8 ng/mL (ref >5.9)

## 2022-05-28 LAB — ABO/RH: ABO/RH(D): O POS

## 2022-05-28 MED ORDER — SODIUM CHLORIDE 0.9 % IV SOLN
200.0000 mg | Freq: Once | INTRAVENOUS | Status: AC
  Administered 2022-05-28: 200 mg via INTRAVENOUS
  Filled 2022-05-28: qty 200

## 2022-05-28 MED ORDER — SODIUM CHLORIDE 0.9 % IV SOLN
Freq: Once | INTRAVENOUS | Status: AC
  Filled 2022-05-28: qty 250

## 2022-05-28 NOTE — Progress Notes (Signed)
Patient here for iron infusion. States that he is extremely fatigued, weak and has to push himself to do things. Dr. Cathie Hoops notified. Patient will be scheduled for a blood transfusion on Monday.   Labs ordered and released, lab notified.  Transfusion scheduled for Monday at 8:45. Patient given appointment details.  Patient verbalized understanding.

## 2022-05-31 ENCOUNTER — Inpatient Hospital Stay: Payer: No Typology Code available for payment source

## 2022-05-31 ENCOUNTER — Telehealth: Payer: Self-pay

## 2022-05-31 DIAGNOSIS — D509 Iron deficiency anemia, unspecified: Secondary | ICD-10-CM

## 2022-05-31 DIAGNOSIS — C9110 Chronic lymphocytic leukemia of B-cell type not having achieved remission: Secondary | ICD-10-CM | POA: Diagnosis not present

## 2022-05-31 LAB — TYPE AND SCREEN

## 2022-05-31 LAB — BPAM RBC

## 2022-05-31 MED ORDER — DIPHENHYDRAMINE HCL 25 MG PO CAPS
25.0000 mg | ORAL_CAPSULE | Freq: Once | ORAL | Status: AC
  Administered 2022-05-31: 25 mg via ORAL
  Filled 2022-05-31: qty 1

## 2022-05-31 MED ORDER — ACETAMINOPHEN 325 MG PO TABS
650.0000 mg | ORAL_TABLET | Freq: Once | ORAL | Status: AC
  Administered 2022-05-31: 650 mg via ORAL
  Filled 2022-05-31: qty 2

## 2022-05-31 MED ORDER — SODIUM CHLORIDE 0.9% IV SOLUTION
250.0000 mL | Freq: Once | INTRAVENOUS | Status: AC
  Administered 2022-05-31: 250 mL via INTRAVENOUS
  Filled 2022-05-31: qty 250

## 2022-05-31 NOTE — Telephone Encounter (Signed)
-----   Message from [name], MD sent at 05/28/2022  5:10 PM EDT ----- He is scheduled to get 1 unit of PRBC on 5/6 Please repeat lab in 1-2 week, just cbc, hold tube

## 2022-05-31 NOTE — Telephone Encounter (Signed)
Pt scheduled for labs on 5/17. Pt aware.

## 2022-06-01 LAB — BPAM RBC
Blood Product Expiration Date: 202406072359
ISSUE DATE / TIME: 202405060904
Unit Type and Rh: 5100

## 2022-06-01 LAB — TYPE AND SCREEN: Unit division: 0

## 2022-06-02 ENCOUNTER — Ambulatory Visit
Admission: RE | Admit: 2022-06-02 | Discharge: 2022-06-02 | Disposition: A | Discharge status: Home / Self Care | Payer: No Typology Code available for payment source | Source: Ambulatory Visit | Attending: Student in an Organized Health Care Education/Training Program | Admitting: Student in an Organized Health Care Education/Training Program

## 2022-06-02 DIAGNOSIS — M47816 Spondylosis without myelopathy or radiculopathy, lumbar region: Secondary | ICD-10-CM | POA: Diagnosis not present

## 2022-06-02 DIAGNOSIS — G8929 Other chronic pain: Secondary | ICD-10-CM | POA: Diagnosis not present

## 2022-06-02 DIAGNOSIS — G894 Chronic pain syndrome: Secondary | ICD-10-CM | POA: Diagnosis not present

## 2022-06-02 DIAGNOSIS — M48062 Spinal stenosis, lumbar region with neurogenic claudication: Secondary | ICD-10-CM | POA: Diagnosis not present

## 2022-06-02 DIAGNOSIS — M5126 Other intervertebral disc displacement, lumbar region: Secondary | ICD-10-CM | POA: Diagnosis not present

## 2022-06-02 DIAGNOSIS — M5416 Radiculopathy, lumbar region: Secondary | ICD-10-CM | POA: Insufficient documentation

## 2022-06-02 NOTE — Progress Notes (Deleted)
05/02/22 4:57 PM   Paul Ana [name] [birth year] [id number]  Referring provider:  Center, [facility] [address] [place],  [place] [address]  Urological history  1. ED -contributing factors of age, BPH, spinal injury, brain injury, DM, HTN, HLD and sleep apnea -PDE5i's intolerable    2. BPH with LU TS -tamsulosin 0.4 mg   3. Prostate cancer screening -PSA (09/2021) 0.3 -maternal uncle with fatal prostate cancer -Discontinued screening at this time  Chief Complaint  Patient presents with   Other    pvr     HPI: Paul Caltrider [name] is a 77 y.o.male who presents today for follow-up.  At his visit on 03/30/2022, I PSS 9/3.  PVR 88 mL.  When I asked him if he is taking his tamsulosin 0.4 mg, he said he didn't have any.  According to the [facility], it looks like he picked up a 90 day supply in January 2024.  He is having some episode of urge incontinence.  Patient denies any modifying or aggravating factors.  Patient denies any gross hematuria, dysuria or suprapubic/flank pain.  Patient denies any fevers, chills, nausea or vomiting.  He was advised to continue the tamsulosin 0.4 mg daily and we gave him a trial of trospium 20 mg twice daily.    At his visit on 04/30/2022, I PSS 16/3.  PVR 35 mL.  He continues to have urgency, but he feels the trospium has helped a little.  He is still having to void in a urinal while on job sites and now he has been encouraged to increase his fluids by another provider.  Patient denies any modifying or aggravating factors.  Patient denies any gross hematuria, dysuria or suprapubic/flank pain.  Patient denies any fevers, chills, nausea or vomiting.  He decided to continue the trospium IR 20 mg twice daily and adjust his fluid intake to see if this would help control his urinary symptoms.   IPSS     Row Name 04/30/22 1000         International Prostate Symptom Score   How often have you had the sensation of not emptying your  bladder? Less than 1 in 5     How often have you had to urinate less than every two hours? About half the time     How often have you found you stopped and started again several times when you urinated? More than half the time     How often have you found it difficult to postpone urination? Not at All     How often have you had a weak urinary stream? About half the time     How often have you had to strain to start urination? Less than 1 in 5 times     How many times did you typically get up at night to urinate? 4 Times     Total IPSS Score 16       Quality of Life due to urinary symptoms   If you were to spend the rest of your life with your urinary condition just the way it is now how would you feel about that? Mixed               Score:  1-7 Mild 8-19 Moderate 20-35 Severe  PMH: Past Medical History:  Diagnosis Date   Acute prostatitis    BPH (benign prostatic hyperplasia)    Chronic kidney disease    Dysplasia of prostate    Erectile dysfunction  HTN (hypertension)    Hypogonadism in male    Orchitis and epididymitis    Over weight    Palindromic rheumatism, hand    Prostatitis    Rectum pain    Testicle swelling    Testicle tenderness    Testicular mass     Surgical History: Past Surgical History:  Procedure Laterality Date   BACK SURGERY     head surgery     from fall  drilled hole in brain to relieve pressure    Home Medications:  Allergies as of 04/30/2022   No Known Allergies      Medication List        Accurate as of April 30, 2022 11:59 PM. If you have any questions, ask your nurse or doctor.          STOP taking these medications    amLODipine 10 MG tablet Commonly known as: NORVASC Stopped by: Yukiko Minnich, PA-C   valsartan-hydrochlorothiazide 320-25 MG tablet Commonly known as: DIOVAN-HCT Stopped by: Michiel Cowboy, PA-C       TAKE these medications    Brukinsa 80 MG capsule Generic drug: zanubrutinib Take 2  capsules (160 mg total) by mouth 2 (two) times daily.   calcium-vitamin D 500-5 MG-MCG tablet Commonly known as: OSCAL WITH D Take 2 tablets by mouth daily.   levothyroxine 200 MCG tablet Commonly known as: SYNTHROID Take 200 mcg by mouth daily.   metFORMIN 500 MG tablet Commonly known as: GLUCOPHAGE Take 500 mg by mouth 2 (two) times daily with a meal.   mupirocin ointment 2 % Commonly known as: BACTROBAN Apply 1 Application topically 2 (two) times daily.   oxyCODONE-acetaminophen 10-325 MG tablet Commonly known as: PERCOCET Take 1 tablet by mouth every 8 (eight) hours as needed.   potassium chloride 10 MEQ tablet Commonly known as: KLOR-CON M Take 1 tablet (10 mEq total) by mouth in the morning.   potassium chloride SA 20 MEQ tablet Commonly known as: KLOR-CON M Take 1 tablet (20 mEq total) by mouth at bedtime.   pregabalin 75 MG capsule Commonly known as: Lyrica Take 1 capsule (75 mg total) by mouth 2 (two) times daily.   rosuvastatin 10 MG tablet Commonly known as: CRESTOR Take 1 tablet by mouth once daily   tamsulosin 0.4 MG Caps capsule Commonly known as: FLOMAX Take 1 capsule (0.4 mg total) by mouth daily.   trospium 20 MG tablet Commonly known as: SANCTURA Take 1 tablet (20 mg total) by mouth 2 (two) times daily.        Allergies:  No Known Allergies  Family History: Family History  Problem Relation Age of Onset   Cancer Maternal Aunt    Prostate cancer Neg Hx    Kidney disease Neg Hx    Kidney cancer Neg Hx    Bladder Cancer Neg Hx     Social History:  reports that he has quit smoking. He has never used smokeless tobacco. He reports that he does not drink alcohol and does not use drugs.   Physical Exam: BP (!) 162/97   Pulse 66   Ht 6\' 1"  (1.854 m)   Wt 234 lb (106.1 kg)   BMI 30.87 kg/m   Constitutional:  Well nourished. Alert and oriented, No acute distress. HEENT: [place] AT, moist mucus membranes.  Trachea midline Cardiovascular: No  clubbing, cyanosis, or edema. Respiratory: Normal respiratory effort, no increased work of breathing. Neurologic: Grossly intact, no focal deficits, moving all 4 extremities. Psychiatric: Normal mood  and affect.   Laboratory Data: Serum creatinine (05/2022) 1.38, GFR 53 I have reviewed the labs.   Pertinent Imaging: N/A    Assessment & Plan:    1. BPH with LUTS -most bothersome symptoms is urge incontinence -continue conservative management, avoiding bladder irritants and timed voiding's -Continue tamsulosin 0.4 mg daily  2. Urge incontinence -has some mild improvement with the trospium IR 20 mg twice daily, but he is not tying to increase his fluids which may make the urgency worse -I offered cystoscopy for further evaluation of his symptoms, but he declined -he will continue the trospium IR 20 mg twice daily for now and follow up in one month to evaluation whether or not his increase in fluid intake will worsen the urgency  Return in about 1 month (around 05/30/2022) for IPSS and PVR.  Cloretta Ned   [facility] [address], [address] [place], [place] [address] [phone]

## 2022-06-03 ENCOUNTER — Ambulatory Visit: Payer: No Typology Code available for payment source | Admitting: Urology

## 2022-06-03 DIAGNOSIS — N138 Other obstructive and reflux uropathy: Secondary | ICD-10-CM

## 2022-06-03 DIAGNOSIS — N3941 Urge incontinence: Secondary | ICD-10-CM

## 2022-06-04 ENCOUNTER — Inpatient Hospital Stay: Payer: No Typology Code available for payment source

## 2022-06-04 VITALS — BP 156/79 | HR 58 | Temp 97.9°F | Resp 17

## 2022-06-04 DIAGNOSIS — C9110 Chronic lymphocytic leukemia of B-cell type not having achieved remission: Secondary | ICD-10-CM

## 2022-06-04 MED ORDER — SODIUM CHLORIDE 0.9 % IV SOLN
200.0000 mg | Freq: Once | INTRAVENOUS | Status: AC
  Administered 2022-06-04: 200 mg via INTRAVENOUS
  Filled 2022-06-04: qty 200

## 2022-06-04 MED ORDER — SODIUM CHLORIDE 0.9 % IV SOLN
Freq: Once | INTRAVENOUS | Status: AC
  Filled 2022-06-04: qty 250

## 2022-06-07 ENCOUNTER — Ambulatory Visit: Payer: No Typology Code available for payment source | Attending: Cardiology | Admitting: Cardiology

## 2022-06-07 ENCOUNTER — Encounter: Payer: Self-pay | Admitting: Cardiology

## 2022-06-07 VITALS — BP 175/84 | HR 63 | Ht 73.0 in | Wt 242.5 lb

## 2022-06-07 DIAGNOSIS — I447 Left bundle-branch block, unspecified: Secondary | ICD-10-CM

## 2022-06-07 DIAGNOSIS — I10 Essential (primary) hypertension: Secondary | ICD-10-CM

## 2022-06-07 MED ORDER — VALSARTAN 160 MG PO TABS: 160.0000 mg | ORAL_TABLET | Freq: Every day | ORAL | 3 refills | Status: DC

## 2022-06-07 NOTE — Progress Notes (Signed)
Cardiology Office Note:    Date:  06/07/2022   ID:  Paul Ana [name], DOB 12-11-1945, MRN [id number]  PCP:  Patient, No Pcp Per   [facility] Providers Cardiologist:  Debbe Odea, MD     Referring MD: Rushie Chestnut, PA-C   Chief Complaint  Patient presents with   New Patient (Initial Visit)    Dizziness/Secondary HTN no complaints today. Meds reviewed verbally with pt.   Paul Emrich [name] is a 77 y.o. male who is being seen today for the evaluation of hypertension at the request of Rushie Chestnut, [place].   History of Present Illness:    Paul Kelsch [name] is a 77 y.o. male with a hx of hypertension, diabetes, CLL s/p chemo who presents due to elevated BP.  Diagnosed with hypertension several years ago, previously on Norvasc, Diovan HCTZ.  Blood pressures have been elevated of late, Diovan HCTZ previously stopped.  He is not sure why.  CLL is adequately controlled, states not being on any medications at this point.  Denies chest pain or shortness of breath.  Denies smoking or alcohol use.  Past Medical History:  Diagnosis Date   Acute prostatitis    BPH (benign prostatic hyperplasia)    Chronic kidney disease    Dysplasia of prostate    Erectile dysfunction    HTN (hypertension)    Hypogonadism in male    Orchitis and epididymitis    Over weight    Palindromic rheumatism, hand    Prostatitis    Rectum pain    Testicle swelling    Testicle tenderness    Testicular mass     Past Surgical History:  Procedure Laterality Date   BACK SURGERY     head surgery     from fall  drilled hole in brain to relieve pressure    Current Medications: Current Meds  Medication Sig   amLODipine (NORVASC) 10 MG tablet Take 10 mg by mouth daily.   levothyroxine (SYNTHROID) 200 MCG tablet Take 200 mcg by mouth daily.   metFORMIN (GLUCOPHAGE) 500 MG tablet Take 500 mg by mouth 2 (two) times daily with a meal.   oxyCODONE-acetaminophen  (PERCOCET) 10-325 MG tablet Take 1 tablet by mouth every 8 (eight) hours as needed.   [START ON 06/24/2022] oxyCODONE-acetaminophen (PERCOCET) 10-325 MG tablet Take 1 tablet by mouth every 8 (eight) hours as needed.   [START ON 07/24/2022] oxyCODONE-acetaminophen (PERCOCET) 10-325 MG tablet Take 1 tablet by mouth every 8 (eight) hours as needed.   potassium chloride (KLOR-CON M) 10 MEQ tablet Take 1 tablet (10 mEq total) by mouth in the morning.   pregabalin (LYRICA) 100 MG capsule Take 1 capsule (100 mg total) by mouth 2 (two) times daily.   rosuvastatin (CRESTOR) 10 MG tablet Take 1 tablet by mouth once daily   trospium (SANCTURA) 20 MG tablet Take 1 tablet (20 mg total) by mouth 2 (two) times daily.   valsartan (DIOVAN) 160 MG tablet Take 1 tablet (160 mg total) by mouth daily.   zanubrutinib (BRUKINSA) 80 MG capsule Take 2 capsules (160 mg total) by mouth 2 (two) times daily.     Allergies:   Patient has no known allergies.   Social History   Socioeconomic History   Marital status: Married    Spouse name: Not on file   Number of children: Not on file   Years of education: Not on file   Highest education level: Not on file  Occupational  History   Not on file  Tobacco Use   Smoking status: Former   Smokeless tobacco: Never   Tobacco comments:    quit 30 years ago  Vaping Use   Vaping Use: Never used  Substance and Sexual Activity   Alcohol use: No    Alcohol/week: 0.0 standard drinks of alcohol   Drug use: No   Sexual activity: Not on file  Other Topics Concern   Not on file  Social History Narrative   Not on file   Social Determinants of Health   Financial Resource Strain: Low Risk  (01/22/2022)   Overall Financial Resource Strain (CARDIA)    Difficulty of Paying Living Expenses: Not very hard  Food Insecurity: No Food Insecurity (12/25/2021)   Hunger Vital Sign    Worried About Running Out of Food in the Last Year: Never true    Ran Out of Food in the Last Year: Never  true  Transportation Needs: No Transportation Needs (01/22/2022)   PRAPARE - Administrator, Civil Service (Medical): No    Lack of Transportation (Non-Medical): No  Physical Activity: Inactive (01/22/2022)   Exercise Vital Sign    Days of Exercise per Week: 0 days    Minutes of Exercise per Session: 0 min  Stress: Stress Concern Present (01/22/2022)   [employer] of Occupational Health - Occupational Stress Questionnaire    Feeling of Stress : To some extent  Social Connections: Socially Integrated (01/22/2022)   Social Connection and Isolation Panel [NHANES]    Frequency of Communication with Friends and Family: More than three times a week    Frequency of Social Gatherings with Friends and Family: Twice a week    Attends Religious Services: 1 to 4 times per year    Active Member of [employer] or Organizations: Yes    Attends Banker Meetings: Never    Marital Status: Married     Family History: The patient's family history includes Cancer in his maternal aunt. There is no history of Prostate cancer, Kidney disease, Kidney cancer, or Bladder Cancer.  ROS:   Please see the history of present illness.     All other systems reviewed and are negative.  EKGs/Labs/Other Studies Reviewed:    The following studies were reviewed today:   EKG:  EKG is  ordered today.  The ekg ordered today demonstrates sinus rhythm, left bundle branch block.  Recent Labs: 07/06/2021: TSH 2.16 05/28/2022: ALT 76; BUN 25; Creatinine 1.38; Hemoglobin 7.4; Platelets 165; Potassium 3.8; Sodium 142  Recent Lipid Panel    Component Value Date/Time   CHOL 181 07/06/2021 1030   TRIG 60 07/06/2021 1030   HDL 61 07/06/2021 1030   CHOLHDL 3.0 07/06/2021 1030   LDLCALC 105 (H) 07/06/2021 1030     Risk Assessment/Calculations:     HYPERTENSION CONTROL Vitals:   06/07/22 1129 06/07/22 1133  BP: (!) 173/87 (!) 175/84    The patient's blood pressure is elevated above target  today.  In order to address the patient's elevated BP: A new medication was prescribed today.            Physical Exam:    VS:  BP (!) 175/84 (BP Location: Right Arm, Patient Position: Sitting, Cuff Size: Large)   Pulse 63   Ht 6\' 1"  (1.854 m)   Wt 242 lb 8 oz (110 kg)   SpO2 98%   BMI 31.99 kg/m     Wt Readings from Last 3 Encounters:  06/07/22 242 lb 8 oz (110 kg)  05/25/22 229 lb (103.9 kg)  05/21/22 233 lb 9.6 oz (106 kg)     GEN:  Well nourished, well developed in no acute distress HEENT: Normal NECK: No JVD; No carotid bruits CARDIAC: RRR, no murmurs, rubs, gallops RESPIRATORY:  Clear to auscultation without rales, wheezing or rhonchi  ABDOMEN: Soft, non-tender, non-distended MUSCULOSKELETAL:  No edema; No deformity  SKIN: Warm and dry NEUROLOGIC:  Alert and oriented x 3 PSYCHIATRIC:  Normal affect   ASSESSMENT:    1. Primary hypertension   2. LBBB (left bundle branch block)    PLAN:    In order of problems listed above:  Hypertension, BP elevated.  Patient is a diabetic.  Start Diovan 160 mg daily, continue Norvasc 10 mg daily. Left bundle branch block, get echocardiogram to rule out any structural abnormalities.  Follow-up after echo.      Medication Adjustments/Labs and Tests Ordered: Current medicines are reviewed at length with the patient today.  Concerns regarding medicines are outlined above.  Orders Placed This Encounter  Procedures   EKG 12-Lead   ECHOCARDIOGRAM COMPLETE   Meds ordered this encounter  Medications   valsartan (DIOVAN) 160 MG tablet    Sig: Take 1 tablet (160 mg total) by mouth daily.    Dispense:  90 tablet    Refill:  3    Patient Instructions  Medication Instructions:   START Valsartan (Diovan) - Take one tablet (160mg ) by mouth daily.   *If you need a refill on your cardiac medications before your next appointment, please call your pharmacy*   Lab Work:  None Ordered  If you have labs (blood work) drawn  today and your tests are completely normal, you will receive your results only by: MyChart Message (if you have MyChart) OR A paper copy in the mail If you have any lab test that is abnormal or we need to change your treatment, we will call you to review the results.   Testing/Procedures:  Your physician has requested that you have an echocardiogram. Echocardiography is a painless test that uses sound waves to create images of your heart. It provides your doctor with information about the size and shape of your heart and how well your heart's chambers and valves are working. This procedure takes approximately one hour. There are no restrictions for this procedure. Please do NOT wear cologne, perfume, aftershave, or lotions (deodorant is allowed). Please arrive 15 minutes prior to your appointment time.    Follow-Up: At [facility], you and your health needs are our priority.  As part of our continuing mission to provide you with exceptional heart care, we have created designated Provider Care Teams.  These Care Teams include your primary Cardiologist (physician) and Advanced Practice Providers (APPs -  Physician Assistants and Nurse Practitioners) who all work together to provide you with the care you need, when you need it.  We recommend signing up for the patient portal called "MyChart".  Sign up information is provided on this After Visit Summary.  MyChart is used to connect with patients for Virtual Visits (Telemedicine).  Patients are able to view lab/test results, encounter notes, upcoming appointments, etc.  Non-urgent messages can be sent to your provider as well.   To learn more about what you can do with MyChart, go to [email or web address].    Your next appointment:    After Echocardiogram   Provider:   You may see Debbe Odea, MD or one of  the following Advanced Practice Providers on your designated Care Team:   Nicolasa Ducking, NP Eula Listen, PA-C Cadence  Fransico Michael, PA-C Charlsie Quest, NP    Signed, Debbe Odea, MD  06/07/2022 12:25 PM    [facility]

## 2022-06-07 NOTE — Patient Instructions (Signed)
Medication Instructions:   START Valsartan (Diovan) - Take one tablet (160mg ) by mouth daily.   *If you need a refill on your cardiac medications before your next appointment, please call your pharmacy*   Lab Work:  None Ordered  If you have labs (blood work) drawn today and your tests are completely normal, you will receive your results only by: MyChart Message (if you have MyChart) OR A paper copy in the mail If you have any lab test that is abnormal or we need to change your treatment, we will call you to review the results.   Testing/Procedures:  Your physician has requested that you have an echocardiogram. Echocardiography is a painless test that uses sound waves to create images of your heart. It provides your doctor with information about the size and shape of your heart and how well your heart's chambers and valves are working. This procedure takes approximately one hour. There are no restrictions for this procedure. Please do NOT wear cologne, perfume, aftershave, or lotions (deodorant is allowed). Please arrive 15 minutes prior to your appointment time.    Follow-Up: At [facility], you and your health needs are our priority.  As part of our continuing mission to provide you with exceptional heart care, we have created designated Provider Care Teams.  These Care Teams include your primary Cardiologist (physician) and Advanced Practice Providers (APPs -  Physician Assistants and Nurse Practitioners) who all work together to provide you with the care you need, when you need it.  We recommend signing up for the patient portal called "MyChart".  Sign up information is provided on this After Visit Summary.  MyChart is used to connect with patients for Virtual Visits (Telemedicine).  Patients are able to view lab/test results, encounter notes, upcoming appointments, etc.  Non-urgent messages can be sent to your provider as well.   To learn more about what you can do with  MyChart, go to [email or web address].    Your next appointment:    After Echocardiogram   Provider:   You may see Debbe Odea, MD or one of the following Advanced Practice Providers on your designated Care Team:   Nicolasa Ducking, NP Eula Listen, PA-C Cadence Fransico Michael, PA-C Charlsie Quest, NP

## 2022-06-08 ENCOUNTER — Other Ambulatory Visit (HOSPITAL_COMMUNITY): Payer: Self-pay

## 2022-06-08 DIAGNOSIS — Z760 Encounter for issue of repeat prescription: Secondary | ICD-10-CM | POA: Diagnosis not present

## 2022-06-08 DIAGNOSIS — Z008 Encounter for other general examination: Secondary | ICD-10-CM | POA: Diagnosis not present

## 2022-06-10 ENCOUNTER — Other Ambulatory Visit (HOSPITAL_COMMUNITY): Payer: Self-pay

## 2022-06-11 ENCOUNTER — Inpatient Hospital Stay: Payer: No Typology Code available for payment source

## 2022-06-11 ENCOUNTER — Other Ambulatory Visit: Payer: Self-pay

## 2022-06-11 VITALS — BP 143/74 | HR 57 | Temp 98.2°F | Resp 17

## 2022-06-11 DIAGNOSIS — C9110 Chronic lymphocytic leukemia of B-cell type not having achieved remission: Secondary | ICD-10-CM

## 2022-06-11 DIAGNOSIS — D509 Iron deficiency anemia, unspecified: Secondary | ICD-10-CM

## 2022-06-11 LAB — CBC WITH DIFFERENTIAL (CANCER CENTER ONLY)
Abs Immature Granulocytes: 0.02 10*3/uL (ref 0.00–0.07)
Basophils Absolute: 0.1 10*3/uL (ref 0.0–0.1)
Basophils Relative: 1 %
Eosinophils Absolute: 0.4 10*3/uL (ref 0.0–0.5)
Eosinophils Relative: 5 %
HCT: 26.7 % — ABNORMAL LOW (ref 39.0–52.0)
Hemoglobin: 8.4 g/dL — ABNORMAL LOW (ref 13.0–17.0)
Immature Granulocytes: 0 %
Lymphocytes Relative: 40 %
Lymphs Abs: 3 10*3/uL (ref 0.7–4.0)
MCH: 28.3 pg (ref 26.0–34.0)
MCHC: 31.5 g/dL (ref 30.0–36.0)
MCV: 89.9 fL (ref 80.0–100.0)
Monocytes Absolute: 1 10*3/uL (ref 0.1–1.0)
Monocytes Relative: 13 %
Neutro Abs: 3.1 10*3/uL (ref 1.7–7.7)
Neutrophils Relative %: 41 %
Platelet Count: 192 10*3/uL (ref 150–400)
RBC: 2.97 MIL/uL — ABNORMAL LOW (ref 4.22–5.81)
RDW: 19.5 % — ABNORMAL HIGH (ref 11.5–15.5)
Smear Review: NORMAL
WBC Count: 7.6 10*3/uL (ref 4.0–10.5)
nRBC: 0 % (ref 0.0–0.2)

## 2022-06-11 LAB — CMP (CANCER CENTER ONLY)
ALT: 44 U/L (ref 0–44)
AST: 82 U/L — ABNORMAL HIGH (ref 15–41)
Albumin: 3.7 g/dL (ref 3.5–5.0)
Alkaline Phosphatase: 68 U/L (ref 38–126)
Anion gap: 8 (ref 5–15)
BUN: 18 mg/dL (ref 8–23)
CO2: 26 mmol/L (ref 22–32)
Calcium: 8.4 mg/dL — ABNORMAL LOW (ref 8.9–10.3)
Chloride: 108 mmol/L (ref 98–111)
Creatinine: 1.2 mg/dL (ref 0.61–1.24)
GFR, Estimated: >60 mL/min (ref >60)
Glucose, Bld: 142 mg/dL — ABNORMAL HIGH (ref 70–99)
Potassium: 3.6 mmol/L (ref 3.5–5.1)
Sodium: 142 mmol/L (ref 135–145)
Total Bilirubin: 0.8 mg/dL (ref 0.3–1.2)
Total Protein: 6.5 g/dL (ref 6.5–8.1)

## 2022-06-11 LAB — SAMPLE TO BLOOD BANK

## 2022-06-11 MED ORDER — SODIUM CHLORIDE 0.9 % IV SOLN
Freq: Once | INTRAVENOUS | Status: AC
  Filled 2022-06-11: qty 250

## 2022-06-11 MED ORDER — SODIUM CHLORIDE 0.9 % IV SOLN
200.0000 mg | Freq: Once | INTRAVENOUS | Status: AC
  Administered 2022-06-11: 200 mg via INTRAVENOUS
  Filled 2022-06-11: qty 200

## 2022-06-11 NOTE — Patient Instructions (Signed)
[facility] [place] [facility]  Discharge Instructions: Thank you for choosing [facility] to provide your oncology and hematology care.  If you have a lab appointment with the Cancer Center, please go directly to the Cancer Center and check in at the registration area.  Wear comfortable clothing and clothing appropriate for easy access to any Portacath or PICC line.   We strive to give you quality time with your provider. You may need to reschedule your appointment if you arrive late (15 or more minutes).  Arriving late affects you and other patients whose appointments are after yours.  Also, if you miss three or more appointments without notifying the office, you may be dismissed from the clinic at the provider's discretion.      For prescription refill requests, have your pharmacy contact our office and allow 72 hours for refills to be completed.    Today you received the following chemotherapy and/or immunotherapy agents Venofer.      To help prevent nausea and vomiting after your treatment, we encourage you to take your nausea medication as directed.  BELOW ARE SYMPTOMS THAT SHOULD BE REPORTED IMMEDIATELY: *FEVER GREATER THAN 100.4 F (38 C) OR HIGHER *CHILLS OR SWEATING *NAUSEA AND VOMITING THAT IS NOT CONTROLLED WITH YOUR NAUSEA MEDICATION *UNUSUAL SHORTNESS OF BREATH *UNUSUAL BRUISING OR BLEEDING *URINARY PROBLEMS (pain or burning when urinating, or frequent urination) *BOWEL PROBLEMS (unusual diarrhea, constipation, pain near the anus) TENDERNESS IN MOUTH AND THROAT WITH OR WITHOUT PRESENCE OF ULCERS (sore throat, sores in mouth, or a toothache) UNUSUAL RASH, SWELLING OR PAIN  UNUSUAL VAGINAL DISCHARGE OR ITCHING   Items with * indicate a potential emergency and should be followed up as soon as possible or go to the Emergency Department if any problems should occur.  Please show the CHEMOTHERAPY ALERT CARD or IMMUNOTHERAPY ALERT CARD at check-in to  the Emergency Department and triage nurse.  Should you have questions after your visit or need to cancel or reschedule your appointment, please contact [place] [facility] [place] [facility]  [phone] and follow the prompts.  Office hours are 8:00 a.m. to 4:30 p.m. Monday - Friday. Please note that voicemails left after 4:00 p.m. may not be returned until the following business day.  We are closed weekends and major holidays. You have access to a nurse at all times for urgent questions. Please call the main number to the clinic [phone] and follow the prompts.  For any non-urgent questions, you may also contact your provider using MyChart. We now offer e-Visits for anyone 18 and older to request care online for non-urgent symptoms. For details visit [email or web address].   Also download the MyChart app! Go to the app store, search "MyChart", open the app, select , and log in with your MyChart username and password.   

## 2022-06-14 ENCOUNTER — Other Ambulatory Visit (HOSPITAL_COMMUNITY): Payer: Self-pay

## 2022-06-16 ENCOUNTER — Ambulatory Visit
Payer: No Typology Code available for payment source | Attending: Student in an Organized Health Care Education/Training Program | Admitting: Student in an Organized Health Care Education/Training Program

## 2022-06-16 ENCOUNTER — Ambulatory Visit
Admission: RE | Admit: 2022-06-16 | Discharge: 2022-06-16 | Disposition: A | Discharge status: Home / Self Care | Payer: No Typology Code available for payment source | Source: Ambulatory Visit | Attending: Student in an Organized Health Care Education/Training Program | Admitting: Student in an Organized Health Care Education/Training Program

## 2022-06-16 ENCOUNTER — Encounter: Payer: Self-pay | Admitting: Student in an Organized Health Care Education/Training Program

## 2022-06-16 DIAGNOSIS — M542 Cervicalgia: Secondary | ICD-10-CM | POA: Insufficient documentation

## 2022-06-16 DIAGNOSIS — M5412 Radiculopathy, cervical region: Secondary | ICD-10-CM | POA: Diagnosis not present

## 2022-06-16 MED ORDER — SODIUM CHLORIDE 0.9% FLUSH
1.0000 mL | Freq: Once | INTRAVENOUS | Status: AC
  Administered 2022-06-16: 1 mL

## 2022-06-16 MED ORDER — ROPIVACAINE HCL 2 MG/ML IJ SOLN
1.0000 mL | Freq: Once | INTRAMUSCULAR | Status: AC
  Administered 2022-06-16: 1 mL via EPIDURAL

## 2022-06-16 MED ORDER — SODIUM CHLORIDE (PF) 0.9 % IJ SOLN
INTRAMUSCULAR | Status: AC
  Filled 2022-06-16: qty 10

## 2022-06-16 MED ORDER — IOHEXOL 180 MG/ML  SOLN
10.0000 mL | Freq: Once | INTRAMUSCULAR | Status: AC
  Administered 2022-06-16: 10 mL via EPIDURAL

## 2022-06-16 MED ORDER — LIDOCAINE HCL 2 % IJ SOLN
20.0000 mL | Freq: Once | INTRAMUSCULAR | Status: AC
  Administered 2022-06-16: 100 mg

## 2022-06-16 MED ORDER — DEXAMETHASONE SODIUM PHOSPHATE 10 MG/ML IJ SOLN
10.0000 mg | Freq: Once | INTRAMUSCULAR | Status: AC
  Administered 2022-06-16: 10 mg

## 2022-06-16 MED ORDER — ROPIVACAINE HCL 2 MG/ML IJ SOLN
INTRAMUSCULAR | Status: AC
  Filled 2022-06-16: qty 20

## 2022-06-16 MED ORDER — IOHEXOL 180 MG/ML  SOLN
INTRAMUSCULAR | Status: AC
  Filled 2022-06-16: qty 20

## 2022-06-16 MED ORDER — DEXAMETHASONE SODIUM PHOSPHATE 10 MG/ML IJ SOLN
INTRAMUSCULAR | Status: AC
  Filled 2022-06-16: qty 1

## 2022-06-16 MED ORDER — LIDOCAINE HCL (PF) 2 % IJ SOLN
INTRAMUSCULAR | Status: AC
  Filled 2022-06-16: qty 10

## 2022-06-16 NOTE — Progress Notes (Signed)
Safety precautions to be maintained throughout the outpatient stay will include: orient to surroundings, keep bed in low position, maintain call bell within reach at all times, provide assistance with transfer out of bed and ambulation.  

## 2022-06-16 NOTE — Progress Notes (Signed)
PROVIDER NOTE: Interpretation of information contained herein should be left to medically-trained personnel. Specific patient instructions are provided elsewhere under "Patient Instructions" section of medical record. This document was created in part using STT-dictation technology, any transcriptional errors that may result from this process are unintentional.  Patient: Paul Briggs. Type: Established DOB: 11/09/1945 MRN: [id number] PCP: Patient, No Pcp Per  Service: Procedure DOS: 06/16/2022 Setting: Ambulatory Location: Ambulatory outpatient facility Delivery: Face-to-face Provider: Edward Jolly, MD Specialty: Interventional Pain Management Specialty designation: 09 Location: Outpatient facility Ref. Prov.: Edward Jolly, MD       Interventional Therapy   Procedure: Cervical Epidural Steroid injection (CESI) (Interlaminar) #1  Laterality: Left  Level: C7-T1 Imaging: Fluoroscopy-assisted DOS: 06/16/2022  Performed by: Edward Jolly, MD Anesthesia: Local anesthesia (1-2% Lidocaine)   Purpose: Diagnostic/Therapeutic Indications: Cervicalgia, cervical radicular pain, degenerative disc disease, severe enough to impact quality of life or function. 1. Cervical radicular pain   2. Cervicalgia    NAS-11 score:   Pre-procedure: 1 /10   Post-procedure: 1 /10      Position  Prep  Materials:  Location setting: Procedure suite Position: Prone, on modified reverse trendelenburg to facilitate breathing, with head in head-cradle. Pillows positioned under chest (below chin-level) with cervical spine flexed. Safety Precautions: Patient was assessed for positional comfort and pressure points before starting the procedure. Prepping solution: DuraPrep (Iodine Povacrylex [0.7% available iodine] and Isopropyl Alcohol, 74% w/w) Prep Area: Entire  cervicothoracic region Approach: percutaneous, paramedial Intended target: Posterior cervical epidural space Materials Procedure:  Tray:  Epidural Needle(s): Epidural (Tuohy) Qty: 1 Length: (90mm) 3.5-inch Gauge: 22G   Pre-op H&P Assessment:  Mr. Kopas is a 77 y.o. (year old), male patient, seen today for interventional treatment. He  has a past surgical history that includes Back surgery and head surgery. Mr. Guardia has a current medication list which includes the following prescription(s): amlodipine, levothyroxine, metformin, oxycodone-acetaminophen, [START ON 06/24/2022] oxycodone-acetaminophen, [START ON 07/24/2022] oxycodone-acetaminophen, pregabalin, rosuvastatin, trospium, valsartan, brukinsa, and potassium chloride. His primarily concern today is the Neck Pain  Initial Vital Signs:  Pulse/HCG Rate: 64ECG Heart Rate: 62 Temp: 97.7 F (36.5 C) Resp: 18 BP: (!) 153/75 SpO2: 100 %  BMI: Estimated body mass index is 31.93 kg/m as calculated from the following:   Height as of this encounter: 6\' 1"  (1.854 m).   Weight as of this encounter: 242 lb (109.8 kg).  Risk Assessment: Allergies: Reviewed. He has No Known Allergies.  Allergy Precautions: None required Coagulopathies: Reviewed. None identified.  Blood-thinner therapy: None at this time Active Infection(s): Reviewed. None identified. Mr. Proffitt is afebrile  Site Confirmation: Mr. Muckelroy was asked to confirm the procedure and laterality before marking the site Procedure checklist: Completed Consent: Before the procedure and under the influence of no sedative(s), amnesic(s), or anxiolytics, the patient was informed of the treatment options, risks and possible complications. To fulfill our ethical and legal obligations, as recommended by the American Medical Association's Code of Ethics, I have informed the patient of my clinical impression; the nature and purpose of the treatment or procedure; the risks, benefits, and possible complications of the intervention; the alternatives, including doing nothing; the risk(s) and benefit(s) of the alternative  treatment(s) or procedure(s); and the risk(s) and benefit(s) of doing nothing. The patient was provided information about the general risks and possible complications associated with the procedure. These may include, but are not limited to: failure to achieve desired goals, infection, bleeding, organ or nerve damage, allergic reactions, paralysis, and death. In  addition, the patient was informed of those risks and complications associated to Spine-related procedures, such as failure to decrease pain; infection (i.e.: Meningitis, epidural or intraspinal abscess); bleeding (i.e.: epidural hematoma, subarachnoid hemorrhage, or any other type of intraspinal or peri-dural bleeding); organ or nerve damage (i.e.: Any type of peripheral nerve, nerve root, or spinal cord injury) with subsequent damage to sensory, motor, and/or autonomic systems, resulting in permanent pain, numbness, and/or weakness of one or several areas of the body; allergic reactions; (i.e.: anaphylactic reaction); and/or death. Furthermore, the patient was informed of those risks and complications associated with the medications. These include, but are not limited to: allergic reactions (i.e.: anaphylactic or anaphylactoid reaction(s)); adrenal axis suppression; blood sugar elevation that in diabetics may result in ketoacidosis or comma; water retention that in patients with history of congestive heart failure may result in shortness of breath, pulmonary edema, and decompensation with resultant heart failure; weight gain; swelling or edema; medication-induced neural toxicity; particulate matter embolism and blood vessel occlusion with resultant organ, and/or nervous system infarction; and/or aseptic necrosis of one or more joints. Finally, the patient was informed that Medicine is not an exact science; therefore, there is also the possibility of unforeseen or unpredictable risks and/or possible complications that may result in a catastrophic  outcome. The patient indicated having understood very clearly. We have given the patient no guarantees and we have made no promises. Enough time was given to the patient to ask questions, all of which were answered to the patient's satisfaction. Mr. Abila has indicated that he wanted to continue with the procedure. Attestation: I, the ordering provider, attest that I have discussed with the patient the benefits, risks, side-effects, alternatives, likelihood of achieving goals, and potential problems during recovery for the procedure that I have provided informed consent. Date  Time: 06/16/2022 10:37 AM   Pre-Procedure Preparation:  Monitoring: As per clinic protocol. Respiration, ETCO2, SpO2, BP, heart rate and rhythm monitor placed and checked for adequate function Safety Precautions: Patient was assessed for positional comfort and pressure points before starting the procedure. Time-out: I initiated and conducted the "Time-out" before starting the procedure, as per protocol. The patient was asked to participate by confirming the accuracy of the "Time Out" information. Verification of the correct person, site, and procedure were performed and confirmed by me, the nursing staff, and the patient. "Time-out" conducted as per Joint Commission's Universal Protocol (UP.01.01.01). Time: 1114 Start Time: 1114 hrs.  Description  Narrative of Procedure:          Rationale (medical necessity): procedure needed and proper for the diagnosis and/or treatment of the patient's medical symptoms and needs. Start Time: 1114 hrs. Safety Precautions: Aspiration looking for blood return was conducted prior to all injections. At no point did we inject any substances, as a needle was being advanced. No attempts were made at seeking any paresthesias. Safe injection practices and needle disposal techniques used. Medications properly checked for expiration dates. SDV (single dose vial) medications used. Description of  procedure: Protocol guidelines were followed. The patient was assisted into a comfortable position. The target area was identified and the area prepped in the usual manner. Skin & deeper tissues infiltrated with local anesthetic. Appropriate amount of time allowed to pass for local anesthetics to take effect. Using fluoroscopic guidance, the epidural needle was introduced through the skin, ipsilateral to the reported pain, and advanced to the target area. Posterior laminar os was contacted and the needle walked caudad, until the lamina was cleared. The ligamentum  flavum was engaged and the epidural space identified using "loss-of-resistance technique" with 2-3 ml of PF-NaCl (0.9% NSS), in a 5cc dedicated LOR syringe. (See "Imaging guidance" below for use of contrast details.) Once proper needle placement was secured, and negative aspiration confirmed, the solution was injected in intermittent fashion, asking for systemic symptoms every 0.5cc. The needles were then removed and the area cleansed, making sure to leave some of the prepping solution back to take advantage of its long term bactericidal properties.  3 cc solution made of 1 cc of preservative-free saline, 1 cc of 0.2% ropivacaine, 1 cc of Decadron 10 mg/cc.   Vitals:   06/16/22 1046 06/16/22 1111 06/16/22 1116 06/16/22 1119  BP: 133/78 128/69 120/70 135/67  Pulse: 66     Resp:  18 18 16   Temp:      TempSrc:      SpO2: 100% 98% 96% 98%  Weight:      Height:         End Time: 1118 hrs.  Imaging Guidance (Spinal):          Type of Imaging Technique: Fluoroscopy Guidance (Spinal) Indication(s): Assistance in needle guidance and placement for procedures requiring needle placement in or near specific anatomical locations not easily accessible without such assistance. Exposure Time: Please see nurses notes. Contrast: Before injecting any contrast, we confirmed that the patient did not have an allergy to iodine, shellfish, or radiological  contrast. Once satisfactory needle placement was completed at the desired level, radiological contrast was injected. Contrast injected under live fluoroscopy. No contrast complications. See chart for type and volume of contrast used. Fluoroscopic Guidance: I was personally present during the use of fluoroscopy. "Tunnel Vision Technique" used to obtain the best possible view of the target area. Parallax error corrected before commencing the procedure. "Direction-depth-direction" technique used to introduce the needle under continuous pulsed fluoroscopy. Once target was reached, antero-posterior, oblique, and lateral fluoroscopic projection used confirm needle placement in all planes. Images permanently stored in EMR. Interpretation: I personally interpreted the imaging intraoperatively. Adequate needle placement confirmed in multiple planes. Appropriate spread of contrast into desired area was observed. No evidence of afferent or efferent intravascular uptake. No intrathecal or subarachnoid spread observed. Permanent images saved into the patient's record.  Post-operative Assessment:  Post-procedure Vital Signs:  Pulse/HCG Rate: 6660 Temp: 97.7 F (36.5 C) Resp: 16 BP: 135/67 SpO2: 98 %  EBL: None  Complications: No immediate post-treatment complications observed by team, or reported by patient.  Note: The patient tolerated the entire procedure well. A repeat set of vitals were taken after the procedure and the patient was kept under observation following institutional policy, for this type of procedure. Post-procedural neurological assessment was performed, showing return to baseline, prior to discharge. The patient was provided with post-procedure discharge instructions, including a section on how to identify potential problems. Should any problems arise concerning this procedure, the patient was given instructions to immediately contact us, at any time, without hesitation. In any case, we plan to  contact the patient by telephone for a follow-up status report regarding this interventional procedure.  Comments:  No additional relevant information.  Plan of Care (POC)  Orders:  Orders Placed This Encounter  Procedures   DG PAIN CLINIC C-ARM 1-60 MIN NO REPORT    Intraoperative interpretation by procedural physician at [facility].    Standing Status:   Standing    Number of Occurrences:   1    Order Specific Question:   Reason  for exam:    Answer:   Assistance in needle guidance and placement for procedures requiring needle placement in or near specific anatomical locations not easily accessible without such assistance.   Chronic Opioid Analgesic:  Percocet 10 mg 3 times daily as needed, quantity 90/month   Medications ordered for procedure: Meds ordered this encounter  Medications   iohexol (OMNIPAQUE) 180 MG/ML injection 10 mL    Must be Myelogram-compatible. If not available, you may substitute with a water-soluble, non-ionic, hypoallergenic, myelogram-compatible radiological contrast medium.   lidocaine (XYLOCAINE) 2 % (with pres) injection 400 mg   sodium chloride flush (NS) 0.9 % injection 1 mL   ropivacaine (PF) 2 mg/mL (0.2%) (NAROPIN) injection 1 mL   dexamethasone (DECADRON) injection 10 mg   Medications administered: We administered iohexol, lidocaine, sodium chloride flush, ropivacaine (PF) 2 mg/mL (0.2%), and dexamethasone.  See the medical record for exact dosing, route, and time of administration.  Follow-up plan:   Return in about 15 days (around 07/01/2022) for PPE: C-ESI and discuss SCS trial vs MILD.       Left shoulder injection 11/10/20, 12/24/20. L5/S1 ESI 04/15/21, Left C7/T1 ESI 06/16/22        Recent Visits Date Type Provider Dept  05/25/22 Office Visit Edward Jolly, MD [facility]  Showing recent visits within past 90 days and meeting all other requirements Today's Visits Date Type Provider Dept  06/16/22 Procedure visit  Edward Jolly, MD [facility]  Showing today's visits and meeting all other requirements Future Appointments Date Type Provider Dept  06/30/22 Appointment Edward Jolly, MD [facility]  08/19/22 Appointment Edward Jolly, MD [facility]  Showing future appointments within next 90 days and meeting all other requirements  Disposition: Discharge home  Discharge (Date  Time): 06/16/2022; 1130 hrs.   Primary Care Physician: Patient, No Pcp Per Location: [facility] Note by: Edward Jolly, MD (TTS technology used. I apologize for any typographical errors that were not detected and corrected.) Date: 06/16/2022; Time: 11:30 AM  Disclaimer:  Medicine is not an Visual merchandiser. The only guarantee in medicine is that nothing is guaranteed. It is important to note that the decision to proceed with this intervention was based on the information collected from the patient. The Data and conclusions were drawn from the patient's questionnaire, the interview, and the physical examination. Because the information was provided in large part by the patient, it cannot be guaranteed that it has not been purposely or unconsciously manipulated. Every effort has been made to obtain as much relevant data as possible for this evaluation. It is important to note that the conclusions that lead to this procedure are derived in large part from the available data. Always take into account that the treatment will also be dependent on availability of resources and existing treatment guidelines, considered by other Pain Management Practitioners as being common knowledge and practice, at the time of the intervention. For Medico-Legal purposes, it is also important to point out that variation in procedural techniques and pharmacological choices are the acceptable norm. The indications, contraindications, technique, and results of the above procedure should only be interpreted and  judged by a Board-Certified Interventional Pain Specialist with extensive familiarity and expertise in the same exact procedure and technique.

## 2022-06-16 NOTE — Patient Instructions (Signed)
____________________________________________________________________________________________  Post-Procedure Discharge Instructions  Instructions: Apply ice:  Purpose: This will minimize any swelling and discomfort after procedure.  When: Day of procedure, as soon as you get home. How: Fill a plastic sandwich bag with crushed ice. Cover it with a small towel and apply to injection site. How long: (15 min on, 15 min off) Apply for 15 minutes then remove x 15 minutes.  Repeat sequence on day of procedure, until you go to bed. Apply heat:  Purpose: To treat any soreness and discomfort from the procedure. When: Starting the next day after the procedure. How: Apply heat to procedure site starting the day following the procedure. How long: May continue to repeat daily, until discomfort goes away. Food intake: Start with clear liquids (like water) and advance to regular food, as tolerated.  Physical activities: Keep activities to a minimum for the first 8 hours after the procedure. After that, then as tolerated. Driving: If you have received any sedation, be responsible and do not drive. You are not allowed to drive for 24 hours after having sedation. Blood thinner: (Applies only to those taking blood thinners) You may restart your blood thinner 6 hours after your procedure. Insulin: (Applies only to Diabetic patients taking insulin) As soon as you can eat, you may resume your normal dosing schedule. Infection prevention: Keep procedure site clean and dry. Shower daily and clean area with soap and water. Post-procedure Pain Diary: Extremely important that this be done correctly and accurately. Recorded information will be used to determine the next step in treatment. For the purpose of accuracy, follow these rules: Evaluate only the area treated. Do not report or include pain from an untreated area. For the purpose of this evaluation, ignore all other areas of pain, except for the treated area. After  your procedure, avoid taking a long nap and attempting to complete the pain diary after you wake up. Instead, set your alarm clock to go off every hour, on the hour, for the initial 8 hours after the procedure. Document the duration of the numbing medicine, and the relief you are getting from it. Do not go to sleep and attempt to complete it later. It will not be accurate. If you received sedation, it is likely that you were given a medication that may cause amnesia. Because of this, completing the diary at a later time may cause the information to be inaccurate. This information is needed to plan your care. Follow-up appointment: Keep your post-procedure follow-up evaluation appointment after the procedure (usually 2 weeks for most procedures, 6 weeks for radiofrequencies). DO NOT FORGET to bring you pain diary with you.   Expect: (What should I expect to see with my procedure?) From numbing medicine (AKA: Local Anesthetics): Numbness or decrease in pain. You may also experience some weakness, which if present, could last for the duration of the local anesthetic. Onset: Full effect within 15 minutes of injected. Duration: It will depend on the type of local anesthetic used. On the average, 1 to 8 hours.  From steroids (Applies only if steroids were used): Decrease in swelling or inflammation. Once inflammation is improved, relief of the pain will follow. Onset of benefits: Depends on the amount of swelling present. The more swelling, the longer it will take for the benefits to be seen. In some cases, up to 10 days. Duration: Steroids will stay in the system x 2 weeks. Duration of benefits will depend on multiple posibilities including persistent irritating factors. Side-effects: If present, they   may typically last 2 weeks (the duration of the steroids). Frequent: Cramps (if they occur, drink Gatorade and take over-the-counter Magnesium 450-500 mg once to twice a day); water retention with temporary  weight gain; increases in blood sugar; decreased immune system response; increased appetite. Occasional: Facial flushing (red, warm cheeks); mood swings; menstrual changes. Uncommon: Long-term decrease or suppression of natural hormones; bone thinning. (These are more common with higher doses or more frequent use. This is why we prefer that our patients avoid having any injection therapies in other practices.)  Very Rare: Severe mood changes; psychosis; aseptic necrosis. From procedure: Some discomfort is to be expected once the numbing medicine wears off. This should be minimal if ice and heat are applied as instructed.  Call if: (When should I call?) You experience numbness and weakness that gets worse with time, as opposed to wearing off. New onset bowel or bladder incontinence. (Applies only to procedures done in the spine)  Emergency Numbers: Durning business hours (Monday - Thursday, 8:00 AM - 4:00 PM) (Friday, 9:00 AM - 12:00 Noon): [phone] After hours: [phone] NOTE: If you are having a problem and are unable connect with, or to talk to a provider, then go to your nearest urgent care or emergency department. If the problem is serious and urgent, please call 911. ____________________________________________________________________________________________  Epidural Steroid Injection Patient Information  Description: The epidural space surrounds the nerves as they exit the spinal cord.  In some patients, the nerves can be compressed and inflamed by a bulging disc or a tight spinal canal (spinal stenosis).  By injecting steroids into the epidural space, we can bring irritated nerves into direct contact with a potentially helpful medication.  These steroids act directly on the irritated nerves and can reduce swelling and inflammation which often leads to decreased pain.  Epidural steroids may be injected anywhere along the spine and from the neck to the low back depending upon the  location of your pain.   After numbing the skin with local anesthetic (like Novocaine), a small needle is passed into the epidural space slowly.  You may experience a sensation of pressure while this is being done.  The entire block usually last less than 10 minutes.  Conditions which may be treated by epidural steroids:  Low back and leg pain Neck and arm pain Spinal stenosis Post-laminectomy syndrome Herpes zoster (shingles) pain Pain from compression fractures  Preparation for the injection:  Do not eat any solid food or dairy products within 8 hours of your appointment.  You may drink clear liquids up to 3 hours before appointment.  Clear liquids include water, black coffee, juice or soda.  No milk or cream please. You may take your regular medication, including pain medications, with a sip of water before your appointment  Diabetics should hold regular insulin (if taken separately) and take 1/2 normal NPH dos the morning of the procedure.  Carry some sugar containing items with you to your appointment. A driver must accompany you and be prepared to drive you home after your procedure.  Bring all your current medications with your. An IV may be inserted and sedation may be given at the discretion of the physician.   A blood pressure cuff, EKG and other monitors will often be applied during the procedure.  Some patients may need to have extra oxygen administered for a short period. You will be asked to provide medical information, including your allergies, prior to the procedure.  We must know immediately   if you are taking blood thinners (like Coumadin/Warfarin)  Or if you are allergic to IV iodine contrast (dye). We must know if you could possible be pregnant.  Possible side-effects: Bleeding from needle site Infection (rare, may require surgery) Nerve injury (rare) Numbness & tingling (temporary) Difficulty urinating (rare, temporary) Spinal headache ( a headache worse with upright  posture) Light -headedness (temporary) Pain at injection site (several days) Decreased blood pressure (temporary) Weakness in arm/leg (temporary) Pressure sensation in back/neck (temporary)  Call if you experience: Fever/chills associated with headache or increased back/neck pain. Headache worsened by an upright position. New onset weakness or numbness of an extremity below the injection site Hives or difficulty breathing (go to the emergency room) Inflammation or drainage at the infection site Severe back/neck pain Any new symptoms which are concerning to you  Please note:  Although the local anesthetic injected can often make your back or neck feel good for several hours after the injection, the pain will likely return.  It takes 3-7 days for steroids to work in the epidural space.  You may not notice any pain relief for at least that one week.  If effective, we will often do a series of three injections spaced 3-6 weeks apart to maximally decrease your pain.  After the initial series, we generally will wait several months before considering a repeat injection of the same type.  If you have any questions, please call [phone] [facility] 

## 2022-06-17 ENCOUNTER — Telehealth: Payer: Self-pay

## 2022-06-17 NOTE — Telephone Encounter (Signed)
Post procedure follow up.  LM 

## 2022-06-18 ENCOUNTER — Inpatient Hospital Stay: Payer: No Typology Code available for payment source

## 2022-06-18 VITALS — BP 139/79 | HR 71 | Temp 96.4°F | Resp 16

## 2022-06-18 DIAGNOSIS — C9110 Chronic lymphocytic leukemia of B-cell type not having achieved remission: Secondary | ICD-10-CM | POA: Diagnosis not present

## 2022-06-18 MED ORDER — SODIUM CHLORIDE 0.9 % IV SOLN
Freq: Once | INTRAVENOUS | Status: AC
  Filled 2022-06-18: qty 250

## 2022-06-18 MED ORDER — SODIUM CHLORIDE 0.9 % IV SOLN
200.0000 mg | Freq: Once | INTRAVENOUS | Status: AC
  Administered 2022-06-18: 200 mg via INTRAVENOUS
  Filled 2022-06-18: qty 200

## 2022-06-18 NOTE — Progress Notes (Signed)
Patient declined to wait the 30 minutes for post iron infusion observation today. Tolerated infusion well. VSS. 

## 2022-06-30 ENCOUNTER — Ambulatory Visit
Payer: No Typology Code available for payment source | Attending: Student in an Organized Health Care Education/Training Program | Admitting: Student in an Organized Health Care Education/Training Program

## 2022-06-30 ENCOUNTER — Ambulatory Visit
Admission: RE | Admit: 2022-06-30 | Discharge: 2022-06-30 | Disposition: A | Discharge status: Home / Self Care | Payer: No Typology Code available for payment source | Source: Ambulatory Visit | Attending: Student in an Organized Health Care Education/Training Program | Admitting: Student in an Organized Health Care Education/Training Program

## 2022-06-30 ENCOUNTER — Encounter: Payer: Self-pay | Admitting: Student in an Organized Health Care Education/Training Program

## 2022-06-30 VITALS — BP 155/90 | HR 62 | Temp 97.9°F | Resp 16 | Ht 72.0 in | Wt 238.2 lb

## 2022-06-30 DIAGNOSIS — G894 Chronic pain syndrome: Secondary | ICD-10-CM | POA: Diagnosis not present

## 2022-06-30 DIAGNOSIS — M5416 Radiculopathy, lumbar region: Secondary | ICD-10-CM | POA: Insufficient documentation

## 2022-06-30 DIAGNOSIS — M5412 Radiculopathy, cervical region: Secondary | ICD-10-CM | POA: Insufficient documentation

## 2022-06-30 DIAGNOSIS — M48062 Spinal stenosis, lumbar region with neurogenic claudication: Secondary | ICD-10-CM | POA: Insufficient documentation

## 2022-06-30 DIAGNOSIS — G8929 Other chronic pain: Secondary | ICD-10-CM | POA: Diagnosis not present

## 2022-06-30 NOTE — Progress Notes (Signed)
PROVIDER NOTE: Information contained herein reflects review and annotations entered in association with encounter. Interpretation of such information and data should be left to medically-trained personnel. Information provided to patient can be located elsewhere in the medical record under "Patient Instructions". Document created using STT-dictation technology, any transcriptional errors that may result from process are unintentional.    Patient: Paul Ana [name]  Service Category: E/M  Provider: Edward Jolly, MD  DOB: 1945-04-06  DOS: 06/30/2022  Referring Provider: No ref. provider found  MRN: [id number]  Specialty: Interventional Pain Management  PCP: Patient, No Pcp Per  Type: Established Patient  Setting: Ambulatory outpatient    Location: Office  Delivery: Face-to-face     HPI  [name], a 77 y.o. year old male, is here today because of his Spinal stenosis, lumbar region, with neurogenic claudication [M48.062]. Mr. Balderrama primary complain today is Shoulder Pain (Bilateral ), Arm Pain (Bilateral ), and Back Pain (Lumbar bilateral)  Pertinent problems: Mr. Socarras has Chronic back pain; Disorder of rotator cuff, left; Primary osteoarthritis of left shoulder; Left rotator cuff tear arthropathy; Spinal stenosis, lumbar region, with neurogenic claudication; Chronic left shoulder pain; Chronic pain syndrome; and Pain management contract signed on their pertinent problem list. Pain Assessment: Severity of Chronic pain is reported as a 5 /10. Location: Shoulder (arms and lower back bilateral) Left, Right/shoulder and arm pain bilaterally, ? coming from neck, no improvement after CESI,  reports that numbness in left  hand has improved.. Onset: More than a month ago. Quality: Constant, Aching, Numbness, Discomfort, Spasm. Timing: Constant. Modifying factor(s): medications, procedure helped a little bit. Vitals:  height is 6' (1.829 m) and weight is 238 lb 3.2 oz (108 kg). His  temporal temperature is 97.9 F (36.6 C). His blood pressure is 155/90 (abnormal) and his pulse is 62. His respiration is 16 and oxygen saturation is 100%.  BMI: Estimated body mass index is 32.31 kg/m as calculated from the following:   Height as of this encounter: 6' (1.829 m).   Weight as of this encounter: 238 lb 3.2 oz (108 kg). Last encounter: 05/25/2022. Last procedure: 06/16/2022.  Reason for encounter: post-procedure evaluation and assessment.    Post-procedure evaluation   Procedure: Cervical Epidural Steroid injection (CESI) (Interlaminar) #1  Laterality: Left  Level: C7-T1 Imaging: Fluoroscopy-assisted DOS: 06/16/2022  Performed by: Edward Jolly, MD Anesthesia: Local anesthesia (1-2% Lidocaine)   Purpose: Diagnostic/Therapeutic Indications: Cervicalgia, cervical radicular pain, degenerative disc disease, severe enough to impact quality of life or function. 1. Cervical radicular pain   2. Cervicalgia    NAS-11 score:   Pre-procedure: 1 /10   Post-procedure: 1 /10      Effectiveness:  Initial hour after procedure: 0 %  Subsequent 4-6 hours post-procedure: 0 %  Analgesia past initial 6 hours: 0 % (numbness in the left hand improved, right is not better.)  Ongoing improvement:  Analgesic:  0%, numbness improved however in left hand    ROS  Constitutional: Denies any fever or chills Gastrointestinal: No reported hemesis, hematochezia, vomiting, or acute GI distress Musculoskeletal:  neck and bilateral arm pain, low back and bilaterag pain Neurological:  parasthesias of bilateral hands and feet  Medication Review  amLODipine, levothyroxine, metFORMIN, oxyCODONE-acetaminophen, pregabalin, rosuvastatin, trospium, valsartan, and zanubrutinib  History Review  Allergy: Mr. Turi has No Known Allergies. Drug: Mr. Pomrenke  reports no history of drug use. Alcohol:  reports no history of alcohol use. Tobacco:  reports that he has quit smoking. He has  never used  smokeless tobacco. Social: Mr. Herzfeld  reports that he has quit smoking. He has never used smokeless tobacco. He reports that he does not drink alcohol and does not use drugs. Medical:  has a past medical history of Acute prostatitis, BPH (benign prostatic hyperplasia), Chronic kidney disease, Dysplasia of prostate, Erectile dysfunction, HTN (hypertension), Hypogonadism in male, Orchitis and epididymitis, Over weight, Palindromic rheumatism, hand, Prostatitis, Rectum pain, Testicle swelling, Testicle tenderness, and Testicular mass. Surgical: Mr. Hochman  has a past surgical history that includes Back surgery and head surgery. Family: family history includes Cancer in his maternal aunt.  Laboratory Chemistry Profile   Renal Lab Results  Component Value Date   BUN 18 06/11/2022   CREATININE 1.20 06/11/2022   BCR NOT APPLICABLE 07/06/2021   GFRAA >60 10/22/2015   GFRNONAA >60 06/11/2022    Hepatic Lab Results  Component Value Date   AST 82 (H) 06/11/2022   ALT 44 06/11/2022   ALBUMIN 3.7 06/11/2022   ALKPHOS 68 06/11/2022   HCVAB NON REACTIVE 12/26/2021   LIPASE 357 02/25/2012    Electrolytes Lab Results  Component Value Date   NA 142 06/11/2022   K 3.6 06/11/2022   CL 108 06/11/2022   CALCIUM 8.4 (L) 06/11/2022    Bone Lab Results  Component Value Date   VD25OH 30.27 05/21/2022   TESTOSTERONE 429 04/19/2017    Inflammation (CRP: Acute Phase) (ESR: Chronic Phase) Lab Results  Component Value Date   CRP 22.2 (H) 12/25/2021   ESRSEDRATE 3 12/25/2021   LATICACIDVEN 2.1 (HH) 12/25/2021         Note: Above Lab results reviewed.  CLINICAL DATA:  No known injury, right arm pain and numbness   EXAM: MRI CERVICAL SPINE WITHOUT CONTRAST   TECHNIQUE: Multiplanar, multisequence MR imaging of the cervical spine was performed. No intravenous contrast was administered.   COMPARISON:  CT cervical spine 10/22/2015   FINDINGS: Alignment: Loss of the normal cervical  lordosis with mild reversal. No static listhesis.   Vertebrae: No acute fracture, evidence of discitis, or aggressive bone lesion.   Cord: Normal signal and morphology.   Posterior Fossa, vertebral arteries, paraspinal tissues: Posterior fossa demonstrates no focal abnormality. Vertebral artery flow voids are maintained. Paraspinal soft tissues are unremarkable.   Disc levels:   Discs: Degenerative disease with disc height loss at C2-3, C3-4, C4-5, C5-6, C6-7 and C7-T1.   C2-3: Broad-based disc bulge. Mild left foraminal stenosis. No right foraminal stenosis. No spinal stenosis. Mild bilateral facet arthropathy.   C3-4: Broad-based disc bulge with a broad central disc protrusion. Bilateral uncovertebral degenerative changes. Moderate bilateral facet arthropathy. Moderate-severe bilateral foraminal stenosis. Moderate spinal stenosis.   C4-5: Broad-based disc bulge flattening of the ventral cervical spinal cord. Moderate bilateral facet arthropathy. Bilateral uncovertebral degenerative changes. Moderate-severe bilateral foraminal stenosis.   C5-6: Broad-based disc bulge with a broad central disc protrusion. Bilateral uncovertebral degenerative changes. Moderate right and severe left foraminal stenosis. Mild spinal stenosis.   C6-7: Broad-based disc bulge. Mild left and moderate right foraminal stenosis. No spinal stenosis.   C7-T1: Mild broad-based disc bulge. Mild-moderate bilateral foraminal stenosis. No spinal stenosis.   IMPRESSION: 1. Diffuse cervical spine spondylosis as described above. 2. No acute osseous injury of the cervical spine.   CLINICAL DATA:  Low back pain with bilateral hip pain   EXAM: MRI LUMBAR SPINE WITHOUT CONTRAST   TECHNIQUE: Multiplanar, multisequence MR imaging of the lumbar spine was performed. No intravenous contrast was administered.  COMPARISON:  07/06/2018   FINDINGS: Segmentation: Transitional anatomy with partial lumbarization  of the S1 vertebral body.   Alignment: Grade 1 anterolisthesis of L3 on L4 and L4 on L5 secondary to facet disease.   Vertebrae: No acute fracture, evidence of discitis, or aggressive bone lesion.   Conus medullaris and cauda equina: Conus extends to the L1-2 level. Conus and cauda equina appear normal.   Paraspinal and other soft tissues: No acute paraspinal abnormality.   Disc levels:   Disc spaces: Degenerative disease with disc height loss at L3-4 and L4-5. Disc desiccation at L5-S1.   T12-L1: No significant disc bulge. No neural foraminal stenosis. No central canal stenosis.   L1-L2: No significant disc bulge. Mild bilateral facet arthropathy. No foraminal or central canal stenosis.   L2-L3: Minimal broad-based disc bulge with a tiny left paracentral disc protrusion. Moderate bilateral facet arthropathy. Mild spinal stenosis. Bilateral lateral recess and stenosis. No foraminal stenosis.   L3-L4: Broad-based disc bulge. Severe bilateral facet arthropathy. Severe spinal stenosis. Moderate bilateral foraminal stenosis.   L4-L5: Broad-based disc bulge. Prior laminectomy. Severe bilateral facet arthropathy and reactive marrow edema. Moderate spinal stenosis. Moderate bilateral foraminal stenosis.   L5-S1: Broad-based disc bulge with a small right subarticular disc protrusion. Mild bilateral facet arthropathy. No foraminal stenosis. Bilateral subarticular recess stenosis. Mild spinal stenosis.   IMPRESSION: 1. Lumbar spine spondylosis as described above, most severe at L3-4 and L4-5. 2. No acute osseous injury of the lumbar spine.     Electronically Signed   By: Elige Ko M.D.   On: 06/07/2022 08:46   Physical Exam  General appearance: Well nourished, well developed, and well hydrated. In no apparent acute distress Mental status: Alert, oriented x 3 (person, place, & time)       Respiratory: No evidence of acute respiratory distress Eyes: PERLA Vitals: BP  (!) 155/90 (BP Location: Right Arm, Patient Position: Sitting, Cuff Size: Large)   Pulse 62   Temp 97.9 F (36.6 C) (Temporal)   Resp 16   Ht 6' (1.829 m)   Wt 238 lb 3.2 oz (108 kg)   SpO2 100%   BMI 32.31 kg/m  BMI: Estimated body mass index is 32.31 kg/m as calculated from the following:   Height as of this encounter: 6' (1.829 m).   Weight as of this encounter: 238 lb 3.2 oz (108 kg). Ideal: Ideal body weight: 77.6 kg (171 lb 1.2 oz) Adjusted ideal body weight: 89.8 kg (197 lb 14.8 oz)  Cervical Spine Area Exam  Skin & Axial Inspection: No masses, redness, edema, swelling, or associated skin lesions Alignment: Symmetrical Functional ROM: Pain restricted ROM, bilaterally Stability: No instability detected Muscle Tone/Strength: Functionally intact. No obvious neuro-muscular anomalies detected. Sensory (Neurological): Dermatomal pain pattern Palpation: No palpable anomalies               Upper Extremity (UE) Exam      Side: Right upper extremity   Side: Left upper extremity  Skin & Extremity Inspection: Skin color, temperature, and hair growth are WNL. No peripheral edema or cyanosis. No masses, redness, swelling, asymmetry, or associated skin lesions. No contractures.   Skin & Extremity Inspection: Skin color, temperature, and hair growth are WNL. No peripheral edema or cyanosis. No masses, redness, swelling, asymmetry, or associated skin lesions. No contractures.  Functional ROM: Pain restricted ROM for shoulder   Functional ROM: Pain restricted ROM for shoulder  Muscle Tone/Strength: Functionally intact. No obvious neuro-muscular anomalies detected.   Muscle Tone/Strength:  Functionally intact. No obvious neuro-muscular anomalies detected.  Sensory (Neurological):Arthropathic arthralgia and possibly dermatomal   Sensory (Neurological): Arthropathic arthralgia and possibly dermatomal          Palpation: No palpable anomalies               Palpation: No palpable anomalies               Provocative Test(s):  Phalen's test: deferred Tinel's test: deferred Apley's scratch test (touch opposite shoulder):  Action 1 (Across chest): Decreased ROM Action 2 (Overhead): Decreased ROM Action 3 (LB reach): Decreased ROM     Provocative Test(s):  Phalen's test: deferred Tinel's test: deferred Apley's scratch test (touch opposite shoulder):  Action 1 (Across chest): Decreased ROM Action 2 (Overhead): Decreased ROM Action 3 (LB reach): Decreased ROM        Lumbar Spine Area Exam  Skin & Axial Inspection: No masses, redness, or swelling Alignment: Symmetrical Functional ROM: Pain restricted ROM       Stability: No instability detected Muscle Tone/Strength: Functionally intact. No obvious neuro-muscular anomalies detected. Sensory (Neurological): Neurogenic pain pattern   Gait & Posture Assessment  Ambulation: Unassisted Gait: Relatively normal for age and body habitus Posture: WNL    Lower Extremity Exam      Side: Right lower extremity   Side: Left lower extremity  Stability: No instability observed           Stability: No instability observed          Skin & Extremity Inspection: Skin color, temperature, and hair growth are WNL. No peripheral edema or cyanosis. No masses, redness, swelling, asymmetry, or associated skin lesions. No contractures.   Skin & Extremity Inspection: Skin color, temperature, and hair growth are WNL. No peripheral edema or cyanosis. No masses, redness, swelling, asymmetry, or associated skin lesions. No contractures.  Functional ROM: Pain restricted ROM for hip and knee joints           Functional ROM: Pain restricted ROM for hip and knee joints          Muscle Tone/Strength: Functionally intact. No obvious neuro-muscular anomalies detected.   Muscle Tone/Strength: Functionally intact. No obvious neuro-muscular anomalies detected.  Sensory (Neurological): Neurogenic pain pattern         Sensory (Neurological): Neurogenic pain pattern         DTR: Patellar: deferred today Achilles: deferred today Plantar: deferred today   DTR: Patellar: deferred today Achilles: deferred today Plantar: deferred today  Palpation: No palpable anomalies   Palpation: No palpable anomalies     Assessment   Diagnosis Status  1. Spinal stenosis, lumbar region, with neurogenic claudication   2. Chronic radicular lumbar pain   3. Lumbar radicular pain   4. Cervical radicular pain   5. Chronic pain syndrome    Persistent Persistent Persistent   Updated Problems: No problems updated.  Plan of Care  Mr. Paik continues to struggle with low back pain which radiates into bilateral legs, right greater than left.  This is in the context of him having 2 prior lumbar spine surgeries, at the age of 62 and 54.  His lumbar MRI shows severe spinal stenosis at L3-L4 along with moderate bilateral foraminal stenosis at L3-L4 and moderate spinal and foraminal stenosis at L4-L5 where he had his prior laminectomy.  We have discussed diagnostic lumbar facet medial branch nerve blocks and possible RFA for his low back pain however this will not be helpful for his radicular component.  He has  tried to lumbar epidural steroid injections without any long-term benefit.  We have also discussed spinal cord stimulation.  Patient and I had a discussion today and he would like to see the neurosurgeon to see if surgery would be helpful.  If not, he states that he is open to considering lumbar medial branch nerve blocks and or spinal cord stim trial.  For the patient's cervical spine pain related to multilevel cervical spine spondylosis resulting in severe bilateral foraminal stenosis at C3-C4, C4-C5, C5-C6 with limited response to cervical epidural steroid injection, I also recommend neurosurgical evaluation to see if cervical decompression could be helpful.  I will see the patient back as scheduled at the end of July to review treatment plan after he has seen  neurosurgery.   Orders:  Orders Placed This Encounter  Procedures   DG PAIN CLINIC C-ARM 1-60 MIN NO REPORT    Intraoperative interpretation by procedural physician at [facility].    Standing Status:   Standing    Number of Occurrences:   1    Order Specific Question:   Reason for exam:    Answer:   Assistance in needle guidance and placement for procedures requiring needle placement in or near specific anatomical locations not easily accessible without such assistance.   Ambulatory referral to Neurosurgery    Referral Priority:   Routine    Referral Type:   Surgical    Referral Reason:   Specialty Services Required    Referred to Provider:   Venetia Night, MD    Requested Specialty:   Neurosurgery    Number of Visits Requested:   1   Follow-up plan:   Return for Keep sch. appt.      Left shoulder injection 11/10/20, 12/24/20. L5/S1 ESI 04/15/21, 06/15/21, Left C7/T1 ESI 06/16/22         Recent Visits Date Type Provider Dept  06/16/22 Procedure visit Edward Jolly, MD [facility]  05/25/22 Office Visit Edward Jolly, MD [facility]  Showing recent visits within past 90 days and meeting all other requirements Today's Visits Date Type Provider Dept  06/30/22 Office Visit Edward Jolly, MD [facility]  Showing today's visits and meeting all other requirements Future Appointments Date Type Provider Dept  08/19/22 Appointment Edward Jolly, MD [facility]  Showing future appointments within next 90 days and meeting all other requirements  I discussed the assessment and treatment plan with the patient. The patient was provided an opportunity to ask questions and all were answered. The patient agreed with the plan and demonstrated an understanding of the instructions.  Patient advised to call back or seek an in-person evaluation if the symptoms or condition worsens.  Duration of encounter: 15 minutes.  Total time on  encounter, as per AMA guidelines included both the face-to-face and non-face-to-face time personally spent by the physician and/or other qualified health care professional(s) on the day of the encounter (includes time in activities that require the physician or other qualified health care professional and does not include time in activities normally performed by clinical staff). Physician's time may include the following activities when performed: Preparing to see the patient (e.g., pre-charting review of records, searching for previously ordered imaging, lab work, and nerve conduction tests) Review of prior analgesic pharmacotherapies. Reviewing PMP Interpreting ordered tests (e.g., lab work, imaging, nerve conduction tests) Performing post-procedure evaluations, including interpretation of diagnostic procedures Obtaining and/or reviewing separately obtained history Performing a medically appropriate examination and/or evaluation Counseling and  educating the patient/family/caregiver Ordering medications, tests, or procedures Referring and communicating with other health care professionals (when not separately reported) Documenting clinical information in the electronic or other health record Independently interpreting results (not separately reported) and communicating results to the patient/ family/caregiver Care coordination (not separately reported)  Note by: Edward Jolly, MD Date: 06/30/2022; Time: 2:52 PM

## 2022-06-30 NOTE — Progress Notes (Signed)
Safety precautions to be maintained throughout the outpatient stay will include: orient to surroundings, keep bed in low position, maintain call bell within reach at all times, provide assistance with transfer out of bed and ambulation.  

## 2022-07-01 ENCOUNTER — Telehealth: Payer: Self-pay | Admitting: *Deleted

## 2022-07-01 DIAGNOSIS — R0982 Postnasal drip: Secondary | ICD-10-CM | POA: Diagnosis not present

## 2022-07-01 DIAGNOSIS — M48062 Spinal stenosis, lumbar region with neurogenic claudication: Secondary | ICD-10-CM | POA: Diagnosis not present

## 2022-07-01 DIAGNOSIS — E119 Type 2 diabetes mellitus without complications: Secondary | ICD-10-CM | POA: Diagnosis not present

## 2022-07-01 DIAGNOSIS — E785 Hyperlipidemia, unspecified: Secondary | ICD-10-CM | POA: Diagnosis not present

## 2022-07-01 DIAGNOSIS — E669 Obesity, unspecified: Secondary | ICD-10-CM | POA: Diagnosis not present

## 2022-07-01 DIAGNOSIS — G4733 Obstructive sleep apnea (adult) (pediatric): Secondary | ICD-10-CM | POA: Diagnosis not present

## 2022-07-01 DIAGNOSIS — N1831 Chronic kidney disease, stage 3a: Secondary | ICD-10-CM | POA: Diagnosis not present

## 2022-07-01 DIAGNOSIS — C9110 Chronic lymphocytic leukemia of B-cell type not having achieved remission: Secondary | ICD-10-CM | POA: Diagnosis not present

## 2022-07-01 NOTE — Telephone Encounter (Signed)
error 

## 2022-07-21 ENCOUNTER — Encounter: Payer: Self-pay | Admitting: Oncology

## 2022-07-21 ENCOUNTER — Inpatient Hospital Stay: Payer: No Typology Code available for payment source | Attending: Oncology

## 2022-07-21 ENCOUNTER — Inpatient Hospital Stay (HOSPITAL_BASED_OUTPATIENT_CLINIC_OR_DEPARTMENT_OTHER): Payer: No Typology Code available for payment source | Admitting: Oncology

## 2022-07-21 ENCOUNTER — Ambulatory Visit: Payer: No Typology Code available for payment source | Attending: Cardiology

## 2022-07-21 VITALS — BP 121/66 | HR 70 | Temp 96.0°F | Resp 18 | Wt 229.7 lb

## 2022-07-21 DIAGNOSIS — E785 Hyperlipidemia, unspecified: Secondary | ICD-10-CM | POA: Diagnosis not present

## 2022-07-21 DIAGNOSIS — C9110 Chronic lymphocytic leukemia of B-cell type not having achieved remission: Secondary | ICD-10-CM | POA: Insufficient documentation

## 2022-07-21 DIAGNOSIS — D509 Iron deficiency anemia, unspecified: Secondary | ICD-10-CM

## 2022-07-21 DIAGNOSIS — I129 Hypertensive chronic kidney disease with stage 1 through stage 4 chronic kidney disease, or unspecified chronic kidney disease: Secondary | ICD-10-CM | POA: Diagnosis not present

## 2022-07-21 DIAGNOSIS — R7401 Elevation of levels of liver transaminase levels: Secondary | ICD-10-CM | POA: Diagnosis not present

## 2022-07-21 DIAGNOSIS — N183 Chronic kidney disease, stage 3 unspecified: Secondary | ICD-10-CM | POA: Insufficient documentation

## 2022-07-21 DIAGNOSIS — Z5111 Encounter for antineoplastic chemotherapy: Secondary | ICD-10-CM | POA: Diagnosis not present

## 2022-07-21 DIAGNOSIS — E119 Type 2 diabetes mellitus without complications: Secondary | ICD-10-CM | POA: Diagnosis not present

## 2022-07-21 DIAGNOSIS — Z125 Encounter for screening for malignant neoplasm of prostate: Secondary | ICD-10-CM | POA: Diagnosis not present

## 2022-07-21 DIAGNOSIS — N1831 Chronic kidney disease, stage 3a: Secondary | ICD-10-CM | POA: Diagnosis not present

## 2022-07-21 DIAGNOSIS — R946 Abnormal results of thyroid function studies: Secondary | ICD-10-CM | POA: Diagnosis not present

## 2022-07-21 DIAGNOSIS — Z0000 Encounter for general adult medical examination without abnormal findings: Secondary | ICD-10-CM | POA: Diagnosis not present

## 2022-07-21 LAB — CBC WITH DIFFERENTIAL (CANCER CENTER ONLY)
Abs Immature Granulocytes: 0.02 10*3/uL (ref 0.00–0.07)
Basophils Absolute: 0.1 10*3/uL (ref 0.0–0.1)
Basophils Relative: 1 %
Eosinophils Absolute: 0.3 10*3/uL (ref 0.0–0.5)
Eosinophils Relative: 4 %
HCT: 24.9 % — ABNORMAL LOW (ref 39.0–52.0)
Hemoglobin: 7.9 g/dL — ABNORMAL LOW (ref 13.0–17.0)
Immature Granulocytes: 0 %
Lymphocytes Relative: 41 %
Lymphs Abs: 3.1 10*3/uL (ref 0.7–4.0)
MCH: 29.5 pg (ref 26.0–34.0)
MCHC: 31.7 g/dL (ref 30.0–36.0)
MCV: 92.9 fL (ref 80.0–100.0)
Monocytes Absolute: 1 10*3/uL (ref 0.1–1.0)
Monocytes Relative: 13 %
Neutro Abs: 3.1 10*3/uL (ref 1.7–7.7)
Neutrophils Relative %: 41 %
Platelet Count: 183 10*3/uL (ref 150–400)
RBC: 2.68 MIL/uL — ABNORMAL LOW (ref 4.22–5.81)
RDW: 20.6 % — ABNORMAL HIGH (ref 11.5–15.5)
Smear Review: NORMAL
WBC Count: 7.5 10*3/uL (ref 4.0–10.5)
WBC Morphology: ABNORMAL
nRBC: 0 % (ref 0.0–0.2)

## 2022-07-21 LAB — RETIC PANEL
Immature Retic Fract: 20.7 % — ABNORMAL HIGH (ref 2.3–15.9)
RBC.: 2.68 MIL/uL — ABNORMAL LOW (ref 4.22–5.81)
Retic Count, Absolute: 50.7 10*3/uL (ref 19.0–186.0)
Retic Ct Pct: 1.9 % (ref 0.4–3.1)
Reticulocyte Hemoglobin: 29.6 pg (ref >27.9)

## 2022-07-21 LAB — CMP (CANCER CENTER ONLY)
ALT: 28 U/L (ref 0–44)
AST: 57 U/L — ABNORMAL HIGH (ref 15–41)
Albumin: 4.2 g/dL (ref 3.5–5.0)
Alkaline Phosphatase: 64 U/L (ref 38–126)
Anion gap: 9 (ref 5–15)
BUN: 28 mg/dL — ABNORMAL HIGH (ref 8–23)
CO2: 24 mmol/L (ref 22–32)
Calcium: 8.9 mg/dL (ref 8.9–10.3)
Chloride: 110 mmol/L (ref 98–111)
Creatinine: 2.08 mg/dL — ABNORMAL HIGH (ref 0.61–1.24)
GFR, Estimated: 32 mL/min — ABNORMAL LOW (ref >60)
Glucose, Bld: 104 mg/dL — ABNORMAL HIGH (ref 70–99)
Potassium: 3.4 mmol/L — ABNORMAL LOW (ref 3.5–5.1)
Sodium: 143 mmol/L (ref 135–145)
Total Bilirubin: 1.2 mg/dL (ref 0.3–1.2)
Total Protein: 6.8 g/dL (ref 6.5–8.1)

## 2022-07-21 LAB — IRON AND TIBC
Iron: 37 ug/dL — ABNORMAL LOW (ref 45–182)
Saturation Ratios: 10 % — ABNORMAL LOW (ref 17.9–39.5)
TIBC: 384 ug/dL (ref 250–450)
UIBC: 347 ug/dL

## 2022-07-21 LAB — FERRITIN: Ferritin: 12 ng/mL — ABNORMAL LOW (ref 24–336)

## 2022-07-21 NOTE — Progress Notes (Signed)
Hematology/Oncology Progress note [phone] [phone]        ASSESSMENT & PLAN:   Cancer Staging  CLL (chronic lymphocytic leukemia) (HCC) Staging form: Chronic Lymphocytic Leukemia / Small Lymphocytic Lymphoma, AJCC 8th Edition - Clinical stage from 12/28/2021: Modified Rai Stage III (Modified Rai risk: High, Lymphocytosis: Present, Adenopathy: Present, Organomegaly: Absent, Anemia: Present, Thrombocytopenia: Absent) - Signed by [name], MD on 01/20/2022   CLL (chronic lymphocytic leukemia) [facility]) Labs are reviewed and discussed with patient. CT chest abdomen pelvis results reviewed and discussed with patient.   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 Improved transaminitis, recommend him to resume Zanubrutinib 160 mg twice daily    IDA (iron deficiency anemia) Anemia is likely multifactorial, could be due to CLL, anemia due to chronic kidney disease and IDA. S/p Venofer weekly x 4 Lab Results  Component Value Date   HGB 7.9 (L) 07/21/2022   TIBC 384 07/21/2022   IRONPCTSAT 10 (L) 07/21/2022   FERRITIN 12 (L) 07/21/2022    Hemoglobin remains low.  Recommend additional Venofer treatments.   CKD (chronic kidney disease) stage 3, GFR 30-59 ml/min (HCC) Encourage oral hydration and avoid nephrotoxins.  Creatinine is worse likely due to recent start of Diovan. I have discussed with his cardiologist Dr. Azucena Cecil whose office will contact patient for BP medication regimen adjustment.    Encounter for antineoplastic chemotherapy Continue treatment as mentioned above.   Transaminitis Improved.      Orders Placed This Encounter  Procedures   CBC with Differential (Cancer Center Only)    Standing Status:   Future    Standing Expiration Date:   07/21/2023   CMP (Cancer Center only)    Standing Status:   Future    Standing Expiration Date:   07/21/2023   Retic  Panel    Standing Status:   Future    Standing Expiration Date:   07/21/2023   Sample to Blood Bank    Standing Status:   Future    Standing Expiration Date:   07/21/2023    Follow up  2 months.  All questions were answered. The patient knows to call the clinic with any problems, questions or concerns.  [name], MD, PhD [facility] Hematology Oncology 07/21/2022    CHIEF COMPLAINTS/PURPOSE OF CONSULTATION:  CLL  HISTORY OF PRESENTING ILLNESS:  Paul Ana [name] 77 y.o. male presents for follow up of CLL I have reviewed his chart and materials related to his cancer extensively and collaborated history with the patient. Summary of oncologic history is as follows: Oncology History  CLL (chronic lymphocytic leukemia) (HCC)  12/25/2021 - 12/26/2021 Hospital Admission   Patient was admitted due to pain and swelling of right ankle, right knee and left wrist.  Patient was found to have WBC 30.4, with neutrophil 29% and lymphocyte 2%.  Peripheral smear showed leukocytosis with slight left shift in myeloid series and lymphocytosis with abnormal morphology.  Uric acid 5.5, worsening renal function with creatinine 1.35, BUN 18, GFR 55 (baseline creatinine 1.08 on 07/06/2021), lactic acid 1.1.  ESR normal, uric acid 5.5 normal, CRP 22.2, LDH 182  Orthopedic surgery was consulted s/p arthrocentesis, cell count 296 with 72% neutrophil, no crystal. Fluid negative for growth.  Patient received cefepime and vancomycin while in the hospital, Patient was discharged on Keflex for 5 days.   12/28/2021 Cancer Staging   Staging form: Chronic Lymphocytic Leukemia / Small Lymphocytic Lymphoma,  AJCC 8th Edition - Clinical stage from 12/28/2021: Modified Rai Stage III (Modified Rai risk: High, Lymphocytosis: Present, Adenopathy: Present, Organomegaly: Absent, Anemia: Present, Thrombocytopenia: Absent) - Signed by [name], MD on 01/20/2022 Stage prefix: Initial diagnosis Hemoglobin (Hgb) (g/dL): 40.9    81/01/9145 Initial Diagnosis   CLL (chronic lymphocytic leukemia)   12/27/21 peripheral blood flowcytometry showed Involvement by CD5+, CD23+, CD20+, CD22+ clonal B cell population, phenotype typical for 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    01/05/2022 Imaging   CT chest abdomen pelvis wo contrast 1. Multiple prominent borderline enlarged and mildly enlarged lymph nodes, most evident in the low anatomic pelvis, as above, compatible with reported clinical history of CLL. 2. There also several small pulmonary nodules in the lungs measuring 5 mm or less in size. This is nonspecific, but statistically likely benign. No follow-up needed if patient is low-risk (and has no known or suspected primary neoplasm). Non-contrast chest CT can be considered in 12 months if patient is high-risk. This recommendation 3. Aortic atherosclerosis, in addition to left main and 2 vessel coronary artery disease. Please note that although the presence of coronary artery calcium documents the presence of coronary artery disease, the severity of this disease and any potential stenosis cannot be assessed on this non-gated CT examination. Assessment for  potential risk factor modification, dietary therapy or pharmacologic therapy may be warranted, if clinically indicated. 4. There are calcifications of the aortic valve. Echocardiographic correlation for evaluation of potential valvular dysfunction may be warranted if clinically indicated. 5. 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 6. Diverticulosis without evidence of acute diverticulitis at this time. 7. Mild cardiomegaly.    + Unintentional weight loss INTERVAL HISTORY Paul Rockett [name] is a 77 y.o. male who has above history reviewed by me today presents for follow up visit for   CLL. He takes Zanubrutinib 160 mg twice daily Tolerates well, appetite is fair. No new complaints.   MEDICAL HISTORY:  Past Medical History:  Diagnosis Date   Acute prostatitis    BPH (benign prostatic hyperplasia)    Chronic kidney disease    Dysplasia of prostate    Erectile dysfunction    HTN (hypertension)    Hypogonadism in male    Orchitis and epididymitis    Over weight    Palindromic rheumatism, hand    Prostatitis    Rectum pain    Testicle swelling    Testicle tenderness    Testicular mass     SURGICAL HISTORY: Past Surgical History:  Procedure Laterality Date   BACK SURGERY     head surgery     from fall  drilled hole in brain to relieve pressure    SOCIAL HISTORY: Social History   Socioeconomic History   Marital status: Married    Spouse name: Not on file   Number of children: Not on file   Years of education: Not on file   Highest education level: Not on file  Occupational History   Not on file  Tobacco Use   Smoking status: Former   Smokeless tobacco: Never   Tobacco comments:    quit 30 years ago  Vaping Use   Vaping Use: Never used  Substance and Sexual Activity   Alcohol use: No    Alcohol/week: 0.0 standard drinks of alcohol   Drug use: No   Sexual activity:  Not on file  Other Topics Concern   Not on file  Social History Narrative   Not on file   Social Determinants of Health   Financial Resource Strain: Low Risk  (01/22/2022)   Overall Financial Resource Strain (CARDIA)    Difficulty of Paying Living Expenses: Not very hard  Food Insecurity: No Food Insecurity (12/25/2021)   Hunger Vital Sign    Worried About Running Out of Food in the Last Year: Never true    Ran Out of Food in the Last Year: Never true  Transportation Needs: No Transportation Needs (01/22/2022)   PRAPARE - Administrator, Civil Service (Medical): No    Lack of Transportation (Non-Medical): No  Physical Activity: Inactive (01/22/2022)   Exercise  Vital Sign    Days of Exercise per Week: 0 days    Minutes of Exercise per Session: 0 min  Stress: Stress Concern Present (01/22/2022)   [employer] of Occupational Health - Occupational Stress Questionnaire    Feeling of Stress : To some extent  Social Connections: Socially Integrated (01/22/2022)   Social Connection and Isolation Panel [NHANES]    Frequency of Communication with Friends and Family: More than three times a week    Frequency of Social Gatherings with Friends and Family: Twice a week    Attends Religious Services: 1 to 4 times per year    Active Member of [employer] or Organizations: Yes    Attends Banker Meetings: Never    Marital Status: Married  Catering manager Violence: Not At Risk (01/22/2022)   Humiliation, Afraid, Rape, and Kick questionnaire    Fear of Current or Ex-Partner: No    Emotionally Abused: No    Physically Abused: No    Sexually Abused: No    FAMILY HISTORY: Family History  Problem Relation Age of Onset   Cancer Maternal Aunt    Prostate cancer Neg Hx    Kidney disease Neg Hx    Kidney cancer Neg Hx    Bladder Cancer Neg Hx     ALLERGIES:  has No Known Allergies.  MEDICATIONS:  Current Outpatient Medications  Medication Sig Dispense Refill   amLODipine (NORVASC) 10 MG tablet Take 10 mg by mouth daily.     levothyroxine (SYNTHROID) 200 MCG tablet Take 200 mcg by mouth daily.     metFORMIN (GLUCOPHAGE) 500 MG tablet Take 500 mg by mouth 2 (two) times daily with a meal.     [START ON 07/24/2022] oxyCODONE-acetaminophen (PERCOCET) 10-325 MG tablet Take 1 tablet by mouth every 8 (eight) hours as needed. 90 tablet 0   pregabalin (LYRICA) 100 MG capsule Take 1 capsule (100 mg total) by mouth 2 (two) times daily. 60 capsule 5   rosuvastatin (CRESTOR) 10 MG tablet Take 1 tablet by mouth once daily 90 tablet 0   trospium (SANCTURA) 20 MG tablet Take 1 tablet (20 mg total) by mouth 2 (two) times daily. 60 tablet 0   valsartan (DIOVAN)  160 MG tablet Take 1 tablet (160 mg total) by mouth daily. 90 tablet 3   zanubrutinib (BRUKINSA) 80 MG capsule Take 2 capsules (160 mg total) by mouth 2 (two) times daily. 120 capsule 1   oxyCODONE-acetaminophen (PERCOCET) 10-325 MG tablet Take 1 tablet by mouth every 8 (eight) hours as needed. (Patient not taking: Reported on 07/21/2022) 90 tablet 0   No current facility-administered medications for this visit.    Review of Systems  Constitutional:  Positive for fatigue. Negative for appetite  change, chills, fever and unexpected weight change.  HENT:   Negative for hearing loss and voice change.   Eyes:  Negative for eye problems and icterus.  Respiratory:  Negative for chest tightness, cough and shortness of breath.   Cardiovascular:  Negative for chest pain and leg swelling.  Gastrointestinal:  Negative for abdominal distention and abdominal pain.  Endocrine: Negative for hot flashes.  Genitourinary:  Negative for difficulty urinating, dysuria and frequency.   Musculoskeletal:  Positive for arthralgias.  Skin:  Negative for itching and rash.  Neurological:  Negative for light-headedness and numbness.  Hematological:  Negative for adenopathy. Does not bruise/bleed easily.  Psychiatric/Behavioral:  Negative for confusion.      PHYSICAL EXAMINATION: ECOG PERFORMANCE STATUS: 1 - Symptomatic but completely ambulatory  Vitals:   07/21/22 1356  BP: 121/66  Pulse: 70  Resp: 18  Temp: (!) 96 F (35.6 C)  SpO2: 97%   Filed Weights   07/21/22 1356  Weight: 229 lb 11.2 oz (104.2 kg)    Physical Exam Constitutional:      General: He is not in acute distress.    Appearance: He is not diaphoretic.  HENT:     Head: Normocephalic and atraumatic.     Nose: Nose normal.     Mouth/Throat:     Pharynx: No oropharyngeal exudate.  Eyes:     General: No scleral icterus.    Pupils: Pupils are equal, round, and reactive to light.  Cardiovascular:     Rate and Rhythm: Normal rate.      Heart sounds: No murmur heard. Pulmonary:     Effort: Pulmonary effort is normal. No respiratory distress.     Breath sounds: No wheezing.  Abdominal:     General: There is no distension.     Palpations: Abdomen is soft.     Tenderness: There is no abdominal tenderness.  Musculoskeletal:     Cervical back: Normal range of motion and neck supple.  Skin:    General: Skin is warm and dry.     Findings: No erythema.  Neurological:     Mental Status: He is alert and oriented to person, place, and time. Mental status is at baseline.     Cranial Nerves: No cranial nerve deficit.     Motor: No abnormal muscle tone.  Psychiatric:        Mood and Affect: Mood and affect normal.      LABORATORY DATA:  I have reviewed the data as listed    Latest Ref Rng & Units 07/21/2022    1:41 PM 06/11/2022   12:36 PM 05/28/2022    9:54 AM  CBC  WBC 4.0 - 10.5 K/uL 7.5  7.6  9.5   Hemoglobin 13.0 - 17.0 g/dL 7.9  8.4  7.4   Hematocrit 39.0 - 52.0 % 24.9  26.7  24.2   Platelets 150 - 400 K/uL 183  192  165       Latest Ref Rng & Units 07/21/2022    1:41 PM 06/11/2022   12:36 PM 05/28/2022    9:54 AM  CMP  Glucose 70 - 99 mg/dL 272  536  644   BUN 8 - 23 mg/dL 28  18  25    Creatinine 0.61 - 1.24 mg/dL 0.34  7.42  5.95   Sodium 135 - 145 mmol/L 143  142  142   Potassium 3.5 - 5.1 mmol/L 3.4  3.6  3.8   Chloride 98 - 111 mmol/L 110  108  107   CO2 22 - 32 mmol/L 24  26  27    Calcium 8.9 - 10.3 mg/dL 8.9  8.4  8.5   Total Protein 6.5 - 8.1 g/dL 6.8  6.5  6.6   Total Bilirubin 0.3 - 1.2 mg/dL 1.2  0.8  0.6   Alkaline Phos 38 - 126 U/L 64  68  63   AST 15 - 41 U/L 57  82  148   ALT 0 - 44 U/L 28  44  76      RADIOGRAPHIC STUDIES: I have personally reviewed the radiological images as listed and agreed with the findings in the report. DG PAIN CLINIC C-ARM 1-60 MIN NO REPORT  Result Date: 06/30/2022 Fluoro was used, but no Radiologist interpretation will be provided. Please refer to "NOTES" tab for  provider progress note.

## 2022-07-21 NOTE — Assessment & Plan Note (Addendum)
Labs are reviewed and discussed with patient. CT chest abdomen pelvis results reviewed and discussed with patient.   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 Improved transaminitis, recommend him to resume Zanubrutinib 160 mg twice daily

## 2022-07-21 NOTE — Assessment & Plan Note (Signed)
Improved

## 2022-07-21 NOTE — Assessment & Plan Note (Addendum)
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 S/p Venofer weekly x 4 Lab Results  Component Value Date   HGB 7.9 (L) 07/21/2022   TIBC 384 07/21/2022   IRONPCTSAT 10 (L) 07/21/2022   FERRITIN 12 (L) 07/21/2022    Hemoglobin remains low.  Recommend additional Venofer treatments.

## 2022-07-21 NOTE — Assessment & Plan Note (Signed)
Continue treatment as mentioned above.  

## 2022-07-21 NOTE — Assessment & Plan Note (Signed)
Encourage oral hydration and avoid nephrotoxins.  Creatinine is worse likely due to recent start of Diovan. I have discussed with his cardiologist Dr. Azucena Cecil whose office will contact patient for BP medication regimen adjustment.

## 2022-07-25 NOTE — Progress Notes (Unsigned)
Referring Physician:  Edward Jolly, MD [address] [place],  [place] [address]  Primary Physician:  Patient, No Pcp Per  History of Present Illness: 07/27/2022 [name] has a history of OSA, HTN, DM, hypothyroidism, peripheral neuropathy, CKD stage 3, BPH, chronic lymphocytic leukemia, obesity, iron deficiency anemia, hyperlipidemia.   Has been seeing pain management ([facility]) and was referred for his neck and lumbar spine.   He has constant  pain in both shoulders down into his arms (entire hand) x years. No significant neck pain. He has intermittent numbness in small/ring fingers. He is dropping things and sometimes has issues with dexterity. He has pain with lifting his arms.   Also with constant LBP with constant posterior bilateral leg pain to his feet. He has numbness and tingling in his feet. He has weakness in his legs. LBP > leg pain, right leg pain = left leg pain. Pain is worse with moving. No alleviating factors.   History of lumbar surgery years ago.   He is taking percocet 10 and lyrica.   His last HgbA1c was 6.0 on 07/21/22.   Bowel/Bladder Dysfunction: none  He does not smoke.   Conservative measures:  Physical therapy: maybe did at [facility] in last year?   Multimodal medical therapy including regular antiinflammatories: percocet, lyrica  Injections:  Left C7-T1 IL ESI 06/16/22 no help  L5-S1 ESI 06/15/21 L5-S1 ESI 04/15/21 Left shoulder injection 12/24/20 Left shoulder injection 11/10/20  Past Surgery:  History of lumbar surgery years ago.   Paul Ana [name] has symptoms of cervical myelopathy. He notes worsening balance and he is dropping things. Sometimes has issues with hand dexterity.   The symptoms are causing a significant impact on the patient's life.   Review of Systems:  A 10 point review of systems is negative, except for the pertinent positives and negatives detailed in the HPI.  Past Medical History: Past Medical  History:  Diagnosis Date   Acute prostatitis    BPH (benign prostatic hyperplasia)    Chronic kidney disease    Dysplasia of prostate    Erectile dysfunction    HTN (hypertension)    Hypogonadism in male    Orchitis and epididymitis    Over weight    Palindromic rheumatism, hand    Prostatitis    Rectum pain    Testicle swelling    Testicle tenderness    Testicular mass     Past Surgical History: Past Surgical History:  Procedure Laterality Date   BACK SURGERY     head surgery     from fall  drilled hole in brain to relieve pressure    Allergies: Allergies as of 07/27/2022   (No Known Allergies)    Medications: Outpatient Encounter Medications as of 07/27/2022  Medication Sig   amLODipine (NORVASC) 10 MG tablet Take 10 mg by mouth daily.   levothyroxine (SYNTHROID) 200 MCG tablet Take 200 mcg by mouth daily.   metFORMIN (GLUCOPHAGE) 500 MG tablet Take 500 mg by mouth 2 (two) times daily with a meal.   oxyCODONE-acetaminophen (PERCOCET) 10-325 MG tablet Take 1 tablet by mouth every 8 (eight) hours as needed.   pregabalin (LYRICA) 100 MG capsule Take 1 capsule (100 mg total) by mouth 2 (two) times daily.   rosuvastatin (CRESTOR) 10 MG tablet Take 1 tablet by mouth once daily   trospium (SANCTURA) 20 MG tablet Take 1 tablet (20 mg total) by mouth 2 (two) times daily.   valsartan (DIOVAN) 160 MG  tablet Take 1 tablet (160 mg total) by mouth daily.   zanubrutinib (BRUKINSA) 80 MG capsule Take 2 capsules (160 mg total) by mouth 2 (two) times daily.   No facility-administered encounter medications on file as of 07/27/2022.    Social History: Social History   Tobacco Use   Smoking status: Former   Smokeless tobacco: Never   Tobacco comments:    quit 30 years ago  Vaping Use   Vaping Use: Never used  Substance Use Topics   Alcohol use: No    Alcohol/week: 0.0 standard drinks of alcohol   Drug use: No    Family Medical History: Family History  Problem Relation Age  of Onset   Cancer Maternal Aunt    Prostate cancer Neg Hx    Kidney disease Neg Hx    Kidney cancer Neg Hx    Bladder Cancer Neg Hx     Physical Examination: Vitals:   07/27/22 1351  BP: 130/76    General: Patient is well developed, well nourished, calm, collected, and in no apparent distress. Attention to examination is appropriate.  Respiratory: Patient is breathing without any difficulty.   NEUROLOGICAL:     Awake, alert, oriented to person, place, and time.  Speech is clear and fluent. Fund of knowledge is appropriate.   Cranial Nerves: Pupils equal round and reactive to light.  Facial tone is symmetric.    No posterior cervical or bilateral trapezial tenderness.   Actively, he cannot get to 90 degrees of shoulder flexion or abduction due to pain on both the right and left. He has limited PROM with pain. He has weakness with stress of RC bilaterally.   Well healed lumbar incision. No tenderness.    No abnormal lesions on exposed skin.   Strength: Side Biceps Triceps Deltoid Interossei Grip Wrist Ext. Wrist Flex.  R 5 5 5 5 5 5 5   L 5 5 5 5 5 5 5    Side Iliopsoas Quads Hamstring PF DF EHL  R 5 5 5 5 5 5   L 5 5 5 5 5 5    Reflexes are 1+ and symmetric at the biceps, brachioradialis, patella and achilles.   Hoffman's is absent.  Clonus is not present.   Bilateral upper and lower extremity sensation is intact to light touch.     Gait is slow.   Medical Decision Making  Imaging: MRI cervical spine dated 04/21/22:  FINDINGS: Alignment: Loss of the normal cervical lordosis with mild reversal. No static listhesis.   Vertebrae: No acute fracture, evidence of discitis, or aggressive bone lesion.   Cord: Normal signal and morphology.   Posterior Fossa, vertebral arteries, paraspinal tissues: Posterior fossa demonstrates no focal abnormality. Vertebral artery flow voids are maintained. Paraspinal soft tissues are unremarkable.   Disc levels:   Discs:  Degenerative disease with disc height loss at C2-3, C3-4, C4-5, C5-6, C6-7 and C7-T1.   C2-3: Broad-based disc bulge. Mild left foraminal stenosis. No right foraminal stenosis. No spinal stenosis. Mild bilateral facet arthropathy.   C3-4: Broad-based disc bulge with a broad central disc protrusion. Bilateral uncovertebral degenerative changes. Moderate bilateral facet arthropathy. Moderate-severe bilateral foraminal stenosis. Moderate spinal stenosis.   C4-5: Broad-based disc bulge flattening of the ventral cervical spinal cord. Moderate bilateral facet arthropathy. Bilateral uncovertebral degenerative changes. Moderate-severe bilateral foraminal stenosis.   C5-6: Broad-based disc bulge with a broad central disc protrusion. Bilateral uncovertebral degenerative changes. Moderate right and severe left foraminal stenosis. Mild spinal stenosis.   C6-7: Broad-based disc  bulge. Mild left and moderate right foraminal stenosis. No spinal stenosis.   C7-T1: Mild broad-based disc bulge. Mild-moderate bilateral foraminal stenosis. No spinal stenosis.   IMPRESSION: 1. Diffuse cervical spine spondylosis as described above. 2. No acute osseous injury of the cervical spine.     Electronically Signed   By: Elige Ko M.D.   On: 04/23/2022 10:45    MRI of lumbar spine dated 06/02/22:  FINDINGS: Segmentation: Transitional anatomy with partial lumbarization of the S1 vertebral body.   Alignment: Grade 1 anterolisthesis of L3 on L4 and L4 on L5 secondary to facet disease.   Vertebrae: No acute fracture, evidence of discitis, or aggressive bone lesion.   Conus medullaris and cauda equina: Conus extends to the L1-2 level. Conus and cauda equina appear normal.   Paraspinal and other soft tissues: No acute paraspinal abnormality.   Disc levels:   Disc spaces: Degenerative disease with disc height loss at L3-4 and L4-5. Disc desiccation at L5-S1.   T12-L1: No significant disc bulge.  No neural foraminal stenosis. No central canal stenosis.   L1-L2: No significant disc bulge. Mild bilateral facet arthropathy. No foraminal or central canal stenosis.   L2-L3: Minimal broad-based disc bulge with a tiny left paracentral disc protrusion. Moderate bilateral facet arthropathy. Mild spinal stenosis. Bilateral lateral recess and stenosis. No foraminal stenosis.   L3-L4: Broad-based disc bulge. Severe bilateral facet arthropathy. Severe spinal stenosis. Moderate bilateral foraminal stenosis.   L4-L5: Broad-based disc bulge. Prior laminectomy. Severe bilateral facet arthropathy and reactive marrow edema. Moderate spinal stenosis. Moderate bilateral foraminal stenosis.   L5-S1: Broad-based disc bulge with a small right subarticular disc protrusion. Mild bilateral facet arthropathy. No foraminal stenosis. Bilateral subarticular recess stenosis. Mild spinal stenosis.   IMPRESSION: 1. Lumbar spine spondylosis as described above, most severe at L3-4 and L4-5. 2. No acute osseous injury of the lumbar spine.     Electronically Signed   By: Elige Ko M.D.   On: 06/07/2022 08:46  I have personally reviewed the images and agree with the above interpretation.  Assessment and Plan: Mr. Sarma is a pleasant 77 y.o. male has constant  pain in both shoulders down into his arms (entire hand) x years. No significant neck pain. He has intermittent numbness in small/ring fingers. He is dropping things and sometimes has issues with dexterity. He has pain with lifting his arms.   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   He is having balance issues, dropping things, and intermittent issues with hand dexterity. Discussed that pain in shoulders is likely shoulder mediated- he has limited painful ROM on exam.   Also with constant LBP with constant posterior bilateral leg  pain to his feet. LBP > leg pain, right leg pain = left leg pain. Pain is worse with moving. History of lumbar surgery years ago.   He has known severe central stenosis L3-L4, moderate spinal stenosis L4-L5, and multilevel foraminal stenosis.   Treatment options discussed with patient and following plan made:   - Recommend he see Dr. Myer Haff to discuss possible surgery options for cervical spine. He has significant stenosis with balance issues. He is dropping things and notes some dexterity issues.  - As above, his shoulder pain is likely more shoulder mediated. If surgery not recommended for cervical spine, would recommend referral to ortho for his shoulders.  - He may be surgical candidate for lumbar spine as well,  but would need to have documented PT done in last year.   I spent a total of 30 minutes in face-to-face and non-face-to-face activities related to this patient's care today including review of outside records, review of imaging, review of symptoms, physical exam, discussion of differential diagnosis, discussion of treatment options, and documentation.   Thank you for involving me in the care of this patient.   Drake Leach PA-C Dept. of Neurosurgery

## 2022-07-26 ENCOUNTER — Telehealth: Payer: Self-pay | Admitting: Physician Assistant

## 2022-07-26 ENCOUNTER — Inpatient Hospital Stay: Payer: No Typology Code available for payment source | Attending: Oncology

## 2022-07-26 VITALS — BP 136/70 | HR 65 | Temp 97.7°F | Resp 18

## 2022-07-26 DIAGNOSIS — N183 Chronic kidney disease, stage 3 unspecified: Secondary | ICD-10-CM | POA: Insufficient documentation

## 2022-07-26 DIAGNOSIS — C9110 Chronic lymphocytic leukemia of B-cell type not having achieved remission: Secondary | ICD-10-CM | POA: Diagnosis not present

## 2022-07-26 DIAGNOSIS — D509 Iron deficiency anemia, unspecified: Secondary | ICD-10-CM | POA: Insufficient documentation

## 2022-07-26 MED ORDER — SODIUM CHLORIDE 0.9 % IV SOLN
Freq: Once | INTRAVENOUS | Status: AC
  Filled 2022-07-26: qty 250

## 2022-07-26 MED ORDER — SODIUM CHLORIDE 0.9 % IV SOLN
200.0000 mg | Freq: Once | INTRAVENOUS | Status: AC
  Administered 2022-07-26: 200 mg via INTRAVENOUS
  Filled 2022-07-26: qty 200

## 2022-07-27 ENCOUNTER — Ambulatory Visit: Payer: No Typology Code available for payment source | Admitting: Cardiology

## 2022-07-27 ENCOUNTER — Ambulatory Visit: Payer: No Typology Code available for payment source | Admitting: Physician Assistant

## 2022-07-27 ENCOUNTER — Ambulatory Visit: Payer: No Typology Code available for payment source | Admitting: Orthopedic Surgery

## 2022-07-27 ENCOUNTER — Encounter: Payer: Self-pay | Admitting: Orthopedic Surgery

## 2022-07-27 VITALS — BP 130/76 | Ht 72.0 in | Wt 237.4 lb

## 2022-07-27 DIAGNOSIS — M5412 Radiculopathy, cervical region: Secondary | ICD-10-CM

## 2022-07-27 DIAGNOSIS — M4802 Spinal stenosis, cervical region: Secondary | ICD-10-CM

## 2022-07-27 DIAGNOSIS — M5416 Radiculopathy, lumbar region: Secondary | ICD-10-CM

## 2022-07-27 DIAGNOSIS — M4726 Other spondylosis with radiculopathy, lumbar region: Secondary | ICD-10-CM

## 2022-07-27 DIAGNOSIS — M25511 Pain in right shoulder: Secondary | ICD-10-CM

## 2022-07-27 DIAGNOSIS — M47816 Spondylosis without myelopathy or radiculopathy, lumbar region: Secondary | ICD-10-CM

## 2022-07-27 DIAGNOSIS — M5031 Other cervical disc degeneration,  high cervical region: Secondary | ICD-10-CM | POA: Diagnosis not present

## 2022-07-27 DIAGNOSIS — M47812 Spondylosis without myelopathy or radiculopathy, cervical region: Secondary | ICD-10-CM

## 2022-07-27 DIAGNOSIS — Z9889 Other specified postprocedural states: Secondary | ICD-10-CM

## 2022-07-27 DIAGNOSIS — M25512 Pain in left shoulder: Secondary | ICD-10-CM

## 2022-07-27 DIAGNOSIS — G8929 Other chronic pain: Secondary | ICD-10-CM

## 2022-07-27 DIAGNOSIS — M4722 Other spondylosis with radiculopathy, cervical region: Secondary | ICD-10-CM | POA: Diagnosis not present

## 2022-07-27 DIAGNOSIS — M48061 Spinal stenosis, lumbar region without neurogenic claudication: Secondary | ICD-10-CM

## 2022-07-27 NOTE — Patient Instructions (Signed)
It was so nice to see you today. Thank you so much for coming in.    You have wear and tear in your neck with some pressure on the spinal cord. This can cause balance problems and issues with your hands (dropping things).   I think the pain in your shoulders is probably from your shoulders. May need to get you to see a shoulder specialist at some point, but first you need to see Dr. Myer Haff to discuss possible surgery for your neck.   You have some wear and tear in your back as well, but the neck needs to be addressed first.   You have a follow up with Dr. Myer Haff next week.   Please do not hesitate to call if you have any questions or concerns. You can also message me in MyChart.   Drake Leach PA-C [phone]

## 2022-07-28 ENCOUNTER — Telehealth: Payer: Self-pay | Admitting: *Deleted

## 2022-07-28 MED ORDER — HYDRALAZINE HCL 50 MG PO TABS: 50.0000 mg | ORAL_TABLET | Freq: Two times a day (BID) | ORAL | 3 refills | Status: DC

## 2022-07-28 NOTE — Telephone Encounter (Signed)
Patient has been made aware. Valsartan has been cancelled and Hydralazine 50 mg twice daily has been sent in.

## 2022-07-28 NOTE — Telephone Encounter (Signed)
-----   Message from Debbe Odea, MD sent at 07/21/2022  2:25 PM EDT ----- Patients creatinine is worse. Please stop diovan. Start hydralazine 50mg  twice daily instead.  Thank you BA

## 2022-07-30 MED FILL — Iron Sucrose Inj 20 MG/ML (Fe Equiv): INTRAVENOUS | Qty: 10 | Status: AC

## 2022-08-02 ENCOUNTER — Inpatient Hospital Stay: Payer: No Typology Code available for payment source

## 2022-08-03 ENCOUNTER — Telehealth: Payer: Self-pay | Admitting: Family Medicine

## 2022-08-03 ENCOUNTER — Ambulatory Visit: Payer: Self-pay | Admitting: *Deleted

## 2022-08-03 NOTE — Telephone Encounter (Signed)
Created in error

## 2022-08-03 NOTE — Telephone Encounter (Signed)
Summary: Med refill, NP appt   Kim with [facility] is calling in requesting a medication refill for the pt. Pt has a NP appt for 09/10/2022 with Jacquenette Shone. metFORMIN (GLUCOPHAGE) 500 MG tablet   [facility] [address] ([address]), [place] - [address] Phone: [phone] Fax: [phone]         Call to [facility]- left message on recorded line- unable to fill medication for patient who is not established at office Attempted to call patient- left message for patient to call nurse line regarding medication need and options.

## 2022-08-03 NOTE — Telephone Encounter (Signed)
  Chief Complaint: medication RF- NP  Disposition: [] ED /[] Urgent Care (no appt availability in office) / [] Appointment(In office/virtual)/ []  [place] [facility]/ [] Home Care/ [] Refused Recommended Disposition /[] [place] [facility]/ [x]  Follow-up with PCP Additional Notes: Received call that patient needs help with RF of metformin from [facility]. Call to patient- he states he is ok with metformin and does not need RF before his appointment   Reason for Disposition  Caller has medicine question only, adult not sick, AND triager answers question  Answer Assessment - Initial Assessment Questions 1. NAME of MEDICINE: "What medicine(s) are you calling about?"     Metformin  2. QUESTION: "What is your question?" (e.g., double dose of medicine, side effect)     Got message that patient needs refill- patient states he does not need refill at this time- advised patient to reach out for advice/care options if needed before his appointment  3. PRESCRIBER: "Who prescribed the medicine?" Reason: if prescribed by specialist, call should be referred to that group.     N/A Received call that patient needs help with RF of metformin from [facility]. Call to patient- he states he is ok with metformin and does not need RF before his appointment  Protocols used: Medication Question Call-A-AH

## 2022-08-03 NOTE — Telephone Encounter (Signed)
Second attempt to contact patient regarding his medication needs- no answer- left message to call nurse line

## 2022-08-04 ENCOUNTER — Inpatient Hospital Stay: Payer: No Typology Code available for payment source | Admitting: Oncology

## 2022-08-04 ENCOUNTER — Inpatient Hospital Stay: Payer: No Typology Code available for payment source

## 2022-08-04 NOTE — Progress Notes (Unsigned)
Referring Physician:  No referring provider defined for this encounter.  Primary Physician:  Patient, No Pcp Per  History of Present Illness: 08/04/2022 Mr. Yolanda Dockendorf is here today with a chief complaint of ***  pain in both shoulders down into his arms (entire hand)     No significant neck pain. He has intermittent numbness in small/ring fingers. He is dropping things and sometimes has issues with dexterity.    Duration: *** Location: *** Quality: *** Severity: ***10/10 Precipitating: aggravated by *** lifting his arms Modifying factors: made better by ***nothing? Weakness: in legs  Timing: *** intermittent Bowel/Bladder Dysfunction: none  Conservative measures:   Physical therapy: *** maybe did at [facility] in last year?   Multimodal medical therapy including regular antiinflammatories: *** percocet, lyrica  Injections: *** has received epidural steroid injections  Left C7-T1 IL ESI 06/16/22 (no help)  L5-S1 ESI 06/15/21 L5-S1 ESI 04/15/21 Left shoulder injection 12/24/20 Left shoulder injection 11/10/20   Past Surgery: *** Lumbar surgery  [name] has ***no symptoms of cervical myelopathy.  The symptoms are causing a significant impact on the patient's life.   Saw Drake Leach PA on 07/27/22:   History of Present Illness: 07/27/2022 Mr. Sherley Leser has a history of OSA, HTN, DM, hypothyroidism, peripheral neuropathy, CKD stage 3, BPH, chronic lymphocytic leukemia, obesity, iron deficiency anemia, hyperlipidemia.    Has been seeing pain management ([facility]) and was referred for his neck and lumbar spine.    He has constant  pain in both shoulders down into his arms (entire hand) x years. No significant neck pain. He has intermittent numbness in small/ring fingers. He is dropping things and sometimes has issues with dexterity. He has pain with lifting his arms.    Also with constant LBP with constant posterior bilateral leg pain to his feet.  He has numbness and tingling in his feet. He has weakness in his legs. LBP > leg pain, right leg pain = left leg pain. Pain is worse with moving. No alleviating factors.    History of lumbar surgery years ago.    He is taking percocet 10 and lyrica.    His last HgbA1c was 6.0 on 07/21/22.    Bowel/Bladder Dysfunction: none   He does not smoke.    [name] has symptoms of cervical myelopathy. He notes worsening balance and he is dropping things. Sometimes has issues with hand dexterity.   I have utilized the care everywhere function in epic to review the outside records available from external health systems.  Review of Systems:  A 10 point review of systems is negative, except for the pertinent positives and negatives detailed in the HPI.  Past Medical History: Past Medical History:  Diagnosis Date   Acute prostatitis    BPH (benign prostatic hyperplasia)    Chronic kidney disease    Dysplasia of prostate    Erectile dysfunction    HTN (hypertension)    Hypogonadism in male    Orchitis and epididymitis    Over weight    Palindromic rheumatism, hand    Prostatitis    Rectum pain    Testicle swelling    Testicle tenderness    Testicular mass     Past Surgical History: Past Surgical History:  Procedure Laterality Date   BACK SURGERY     head surgery     from fall  drilled hole in brain to relieve pressure    Allergies: Allergies as of 08/05/2022   (  No Known Allergies)    Medications:  Current Outpatient Medications:    amLODipine (NORVASC) 10 MG tablet, Take 10 mg by mouth daily., Disp: , Rfl:    hydrALAZINE (APRESOLINE) 50 MG tablet, Take 1 tablet (50 mg total) by mouth in the morning and at bedtime., Disp: 60 tablet, Rfl: 3   levothyroxine (SYNTHROID) 200 MCG tablet, Take 200 mcg by mouth daily., Disp: , Rfl:    metFORMIN (GLUCOPHAGE) 500 MG tablet, Take 500 mg by mouth 2 (two) times daily with a meal., Disp: , Rfl:    oxyCODONE-acetaminophen  (PERCOCET) 10-325 MG tablet, Take 1 tablet by mouth every 8 (eight) hours as needed., Disp: 90 tablet, Rfl: 0   pregabalin (LYRICA) 100 MG capsule, Take 1 capsule (100 mg total) by mouth 2 (two) times daily., Disp: 60 capsule, Rfl: 5   rosuvastatin (CRESTOR) 10 MG tablet, Take 1 tablet by mouth once daily, Disp: 90 tablet, Rfl: 0   trospium (SANCTURA) 20 MG tablet, Take 1 tablet (20 mg total) by mouth 2 (two) times daily., Disp: 60 tablet, Rfl: 0   zanubrutinib (BRUKINSA) 80 MG capsule, Take 2 capsules (160 mg total) by mouth 2 (two) times daily., Disp: 120 capsule, Rfl: 1  Social History: Social History   Tobacco Use   Smoking status: Former   Smokeless tobacco: Never   Tobacco comments:    quit 30 years ago  Vaping Use   Vaping Use: Never used  Substance Use Topics   Alcohol use: No    Alcohol/week: 0.0 standard drinks of alcohol   Drug use: No    Family Medical History: Family History  Problem Relation Age of Onset   Cancer Maternal Aunt    Prostate cancer Neg Hx    Kidney disease Neg Hx    Kidney cancer Neg Hx    Bladder Cancer Neg Hx     Physical Examination: There were no vitals filed for this visit.  General: Patient is in no apparent distress. Attention to examination is appropriate.  Neck:   Supple.  Full range of motion.  Respiratory: Patient is breathing without any difficulty.   NEUROLOGICAL:     Awake, alert, oriented to person, place, and time.  Speech is clear and fluent.   Cranial Nerves: Pupils equal round and reactive to light.  Facial tone is symmetric.  Facial sensation is symmetric. Shoulder shrug is symmetric. Tongue protrusion is midline.  There is no pronator drift.  Strength: Side Biceps Triceps Deltoid Interossei Grip Wrist Ext. Wrist Flex.  R 5 5 5 5 5 5 5   L 5 5 5 5 5 5 5    Side Iliopsoas Quads Hamstring PF DF EHL  R 5 5 5 5 5 5   L 5 5 5 5 5 5    Reflexes are ***2+ and symmetric at the biceps, triceps, brachioradialis, patella and  achilles.   Hoffman's is absent.   Bilateral upper and lower extremity sensation is intact to light touch.    No evidence of dysmetria noted.  Gait is normal.     Medical Decision Making  Imaging: ***  I have personally reviewed the images and agree with the above interpretation.  Assessment and Plan: Mr. Dellarocco is a pleasant 77 y.o. male with ***    Thank you for involving me in the care of this patient.      Marilu Rylander K. Myer Haff MD, [facility] Neurosurgery

## 2022-08-05 ENCOUNTER — Encounter: Payer: Self-pay | Admitting: Neurosurgery

## 2022-08-05 ENCOUNTER — Ambulatory Visit: Payer: No Typology Code available for payment source | Admitting: Neurosurgery

## 2022-08-05 ENCOUNTER — Ambulatory Visit: Payer: No Typology Code available for payment source | Attending: Cardiology

## 2022-08-05 VITALS — BP 140/82 | Ht 72.0 in | Wt 237.0 lb

## 2022-08-05 DIAGNOSIS — I447 Left bundle-branch block, unspecified: Secondary | ICD-10-CM | POA: Diagnosis not present

## 2022-08-05 DIAGNOSIS — M4802 Spinal stenosis, cervical region: Secondary | ICD-10-CM

## 2022-08-05 DIAGNOSIS — G959 Disease of spinal cord, unspecified: Secondary | ICD-10-CM

## 2022-08-05 DIAGNOSIS — M48062 Spinal stenosis, lumbar region with neurogenic claudication: Secondary | ICD-10-CM

## 2022-08-05 NOTE — Patient Instructions (Signed)
Please see below for information in regards to your upcoming surgery:   Planned surgery: C3-5 anterior cervical discectomy and fusion   Surgery date: 09/10/22 at [facility] - you will find out your arrival time the business day before your surgery.   Pre-op appointment at [facility] Pre-admit Testing: we will call you with a date/time for this. Pre-admit testing is located on the first floor of the [facility] building, [address] [facility] [address]. Please bring all prescriptions in the original prescription bottles to your appointment, even if you have reviewed medications by phone with a pharmacy representative. During this appointment, they will advise you which medications you can take the morning of surgery, and which medications you will need to hold for surgery. Pre-op labs may be done at your pre-op appointment. You are not required to fast for these labs. Should you need to change your pre-op appointment, please call Pre-admit testing at [phone].    Surgical clearance: we will send a clearance form to Debbra Riding, PA-C    Metformin: hold for 2 days prior to surgery    NSAIDS (Non-steroidal anti-inflammatory drugs): because you are having a fusion, please avoid taking any NSAIDS (examples: ibuprofen, motrin, aleve, naproxen, meloxicam, diclofenac) for 3 months after surgery. Celebrex is an exception and is OK to take, if prescribed. Tylenol is not an NSAID.    Because you are having a fusion or arthroplasty: for appointments after your 2 week follow-up: please arrive at the [facility] outpatient imaging center ([address], [address], [place]) or [facility] one hour prior to your appointment for x-rays. This applies to every appointment after your 2 week follow-up. Failure to do so may result in your appointment being rescheduled.    Common restrictions after surgery: No bending, lifting, or twisting  ("BLT"). Avoid lifting objects heavier than 10 pounds for the first 6 weeks after surgery. Where possible, avoid household activities that involve lifting, bending, reaching, pushing, or pulling such as laundry, vacuuming, grocery shopping, and childcare. Try to arrange for help from friends and family for these activities while your back heals. Do not drive while taking prescription pain medication. Weeks 6 through 12 after surgery: avoid lifting more than 25 pounds.     How to contact us:  If you have any questions/concerns before or after surgery, you can reach [place] at [phone], or you can send a mychart message. We can be reached by phone or mychart 8am-4pm, Monday-Friday.  *Please note: Calls after 4pm are forwarded to a third party answering service. Mychart messages are not routinely monitored during evenings, weekends, and holidays. Please call our office to contact the answering service for urgent concerns during non-business hours.    If you have FMLA/disability paperwork, please drop it off or fax it to [phone], attention Patty.   Appointments/FMLA & disability paperwork: Patty & Cristin  Nurse: Royston Cowper  Medical assistants: Laurann [place] Physician Assistant's: Manning Charity & Drake Leach Surgeons: Venetia Night, MD & Ernestine Mcmurray, MD

## 2022-08-06 ENCOUNTER — Other Ambulatory Visit (HOSPITAL_COMMUNITY): Payer: Self-pay

## 2022-08-06 ENCOUNTER — Inpatient Hospital Stay: Payer: No Typology Code available for payment source

## 2022-08-06 ENCOUNTER — Other Ambulatory Visit: Payer: Self-pay

## 2022-08-06 ENCOUNTER — Inpatient Hospital Stay (HOSPITAL_BASED_OUTPATIENT_CLINIC_OR_DEPARTMENT_OTHER): Payer: No Typology Code available for payment source | Admitting: Oncology

## 2022-08-06 ENCOUNTER — Encounter: Payer: Self-pay | Admitting: Oncology

## 2022-08-06 VITALS — BP 145/72

## 2022-08-06 VITALS — BP 144/75 | HR 57 | Temp 98.0°F | Resp 18 | Wt 237.0 lb

## 2022-08-06 DIAGNOSIS — D509 Iron deficiency anemia, unspecified: Secondary | ICD-10-CM

## 2022-08-06 DIAGNOSIS — Z5111 Encounter for antineoplastic chemotherapy: Secondary | ICD-10-CM

## 2022-08-06 DIAGNOSIS — R059 Cough, unspecified: Secondary | ICD-10-CM | POA: Insufficient documentation

## 2022-08-06 DIAGNOSIS — R7401 Elevation of levels of liver transaminase levels: Secondary | ICD-10-CM | POA: Diagnosis not present

## 2022-08-06 DIAGNOSIS — C9110 Chronic lymphocytic leukemia of B-cell type not having achieved remission: Secondary | ICD-10-CM

## 2022-08-06 DIAGNOSIS — N1831 Chronic kidney disease, stage 3a: Secondary | ICD-10-CM | POA: Diagnosis not present

## 2022-08-06 DIAGNOSIS — R051 Acute cough: Secondary | ICD-10-CM

## 2022-08-06 LAB — CBC WITH DIFFERENTIAL (CANCER CENTER ONLY)
Abs Immature Granulocytes: 0.02 10*3/uL (ref 0.00–0.07)
Basophils Absolute: 0.1 10*3/uL (ref 0.0–0.1)
Basophils Relative: 1 %
Eosinophils Absolute: 0.2 10*3/uL (ref 0.0–0.5)
Eosinophils Relative: 3 %
HCT: 26.4 % — ABNORMAL LOW (ref 39.0–52.0)
Hemoglobin: 8.1 g/dL — ABNORMAL LOW (ref 13.0–17.0)
Immature Granulocytes: 0 %
Lymphocytes Relative: 60 %
Lymphs Abs: 4.5 10*3/uL — ABNORMAL HIGH (ref 0.7–4.0)
MCH: 28.6 pg (ref 26.0–34.0)
MCHC: 30.7 g/dL (ref 30.0–36.0)
MCV: 93.3 fL (ref 80.0–100.0)
Monocytes Absolute: 0.3 10*3/uL (ref 0.1–1.0)
Monocytes Relative: 4 %
Neutro Abs: 2.5 10*3/uL (ref 1.7–7.7)
Neutrophils Relative %: 32 %
Platelet Count: 123 10*3/uL — ABNORMAL LOW (ref 150–400)
RBC: 2.83 MIL/uL — ABNORMAL LOW (ref 4.22–5.81)
RDW: 18.9 % — ABNORMAL HIGH (ref 11.5–15.5)
WBC Count: 7.6 10*3/uL (ref 4.0–10.5)
nRBC: 0 % (ref 0.0–0.2)

## 2022-08-06 LAB — RETIC PANEL
Immature Retic Fract: 13.4 % (ref 2.3–15.9)
RBC.: 2.89 MIL/uL — ABNORMAL LOW (ref 4.22–5.81)
Retic Count, Absolute: 45.4 10*3/uL (ref 19.0–186.0)
Retic Ct Pct: 1.6 % (ref 0.4–3.1)
Reticulocyte Hemoglobin: 24.3 pg — ABNORMAL LOW (ref >27.9)

## 2022-08-06 LAB — SAMPLE TO BLOOD BANK

## 2022-08-06 LAB — ECHOCARDIOGRAM COMPLETE
Area-P 1/2: 2.8 cm2
Height: 72 in
S' Lateral: 4 cm
Weight: 3792 oz

## 2022-08-06 LAB — CMP (CANCER CENTER ONLY)
ALT: 23 U/L (ref 0–44)
AST: 45 U/L — ABNORMAL HIGH (ref 15–41)
Albumin: 3.7 g/dL (ref 3.5–5.0)
Alkaline Phosphatase: 53 U/L (ref 38–126)
Anion gap: 6 (ref 5–15)
BUN: 16 mg/dL (ref 8–23)
CO2: 25 mmol/L (ref 22–32)
Calcium: 8.1 mg/dL — ABNORMAL LOW (ref 8.9–10.3)
Chloride: 106 mmol/L (ref 98–111)
Creatinine: 1.15 mg/dL (ref 0.61–1.24)
GFR, Estimated: >60 mL/min (ref >60)
Glucose, Bld: 145 mg/dL — ABNORMAL HIGH (ref 70–99)
Potassium: 3.7 mmol/L (ref 3.5–5.1)
Sodium: 137 mmol/L (ref 135–145)
Total Bilirubin: 0.4 mg/dL (ref 0.3–1.2)
Total Protein: 6.4 g/dL — ABNORMAL LOW (ref 6.5–8.1)

## 2022-08-06 LAB — IRON AND TIBC
Iron: 25 ug/dL — ABNORMAL LOW (ref 45–182)
Saturation Ratios: 7 % — ABNORMAL LOW (ref 17.9–39.5)
TIBC: 339 ug/dL (ref 250–450)
UIBC: 314 ug/dL

## 2022-08-06 LAB — FERRITIN: Ferritin: 14 ng/mL — ABNORMAL LOW (ref 24–336)

## 2022-08-06 MED ORDER — SODIUM CHLORIDE 0.9 % IV SOLN
200.0000 mg | Freq: Once | INTRAVENOUS | Status: DC
  Filled 2022-08-06: qty 10

## 2022-08-06 MED ORDER — SODIUM CHLORIDE 0.9 % IV SOLN
200.0000 mg | Freq: Once | INTRAVENOUS | Status: AC
  Administered 2022-08-06: 200 mg via INTRAVENOUS
  Filled 2022-08-06: qty 200

## 2022-08-06 MED ORDER — SODIUM CHLORIDE 0.9 % IV SOLN
INTRAVENOUS | Status: DC
  Filled 2022-08-06: qty 250

## 2022-08-06 MED ORDER — BRUKINSA 80 MG PO CAPS
160.0000 mg | ORAL_CAPSULE | Freq: Two times a day (BID) | ORAL | 1 refills | Status: DC
Start: 2022-08-06 — End: 2022-09-20
  Filled 2022-08-06 – 2022-08-09 (×2): qty 120, 30d supply, fill #0

## 2022-08-06 NOTE — Assessment & Plan Note (Signed)
Check checks x-ray

## 2022-08-06 NOTE — Patient Instructions (Signed)

## 2022-08-06 NOTE — Assessment & Plan Note (Signed)
Continue treatment as mentioned above.  

## 2022-08-06 NOTE — Assessment & Plan Note (Signed)
Labs are reviewed and discussed with patient. CT chest abdomen pelvis results reviewed and discussed with patient.   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 Improved transaminitis, recommend him to resume Zanubrutinib 160 mg twice daily   

## 2022-08-06 NOTE — Progress Notes (Signed)
Patient here for oncology follow-up appointment, expresses no complaints or concerns at this time.    

## 2022-08-06 NOTE — Assessment & Plan Note (Addendum)
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 Lab Results  Component Value Date   HGB 8.1 (L) 08/06/2022   TIBC 339 08/06/2022   IRONPCTSAT 7 (L) 08/06/2022   FERRITIN 14 (L) 08/06/2022    Hemoglobin remains low.  Recommend additional Venofer weekly x 4 Suspect GI blood loss, refer to gastroenterology for further evaluation.

## 2022-08-06 NOTE — Assessment & Plan Note (Signed)
Improved

## 2022-08-06 NOTE — Assessment & Plan Note (Addendum)
Encourage oral hydration and avoid nephrotoxins.  Creatinine has improved.

## 2022-08-06 NOTE — Progress Notes (Signed)
Hematology/Oncology Progress note [phone] [phone]        ASSESSMENT & PLAN:   Cancer Staging  CLL (chronic lymphocytic leukemia) (HCC) Staging form: Chronic Lymphocytic Leukemia / Small Lymphocytic Lymphoma, AJCC 8th Edition - Clinical stage from 12/28/2021: Modified Rai Stage III (Modified Rai risk: High, Lymphocytosis: Present, Adenopathy: Present, Organomegaly: Absent, Anemia: Present, Thrombocytopenia: Absent) - Signed by [name], MD on 01/20/2022   CLL (chronic lymphocytic leukemia) [facility]) Labs are reviewed and discussed with patient. CT chest abdomen pelvis results reviewed and discussed with patient.   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 Improved transaminitis, recommend him to resume Zanubrutinib 160 mg twice daily    CKD (chronic kidney disease) stage 3, GFR 30-59 ml/min (HCC) Encourage oral hydration and avoid nephrotoxins.  Creatinine has improved.   IDA (iron deficiency anemia) Anemia is likely multifactorial, could be due to CLL, anemia due to chronic kidney disease and IDA. Lab Results  Component Value Date   HGB 8.1 (L) 08/06/2022   TIBC 339 08/06/2022   IRONPCTSAT 7 (L) 08/06/2022   FERRITIN 14 (L) 08/06/2022    Hemoglobin remains low.  Recommend additional Venofer weekly x 4 Suspect GI blood loss, refer to gastroenterology for further evaluation.  Encounter for antineoplastic chemotherapy Continue treatment as mentioned above.   Transaminitis Improved.   Cough Check checks x-ray     Orders Placed This Encounter  Procedures   DG Chest 2 View    Standing Status:   Future    Standing Expiration Date:   08/06/2023    Order Specific Question:   Reason for Exam (SYMPTOM  OR DIAGNOSIS REQUIRED)    Answer:   Cough    Order Specific Question:   Preferred imaging location?    Answer:   [facility]   Iron and TIBC    Standing  Status:   Future    Number of Occurrences:   1    Standing Expiration Date:   08/06/2023   Ferritin    Standing Status:   Future    Number of Occurrences:   1    Standing Expiration Date:   08/06/2023   CBC with Differential (Cancer Center Only)    Standing Status:   Future    Standing Expiration Date:   08/06/2023   Retic Panel    Standing Status:   Future    Standing Expiration Date:   08/06/2023   CBC with Differential (Cancer Center Only)    Standing Status:   Future    Standing Expiration Date:   08/06/2023   CMP (Cancer Center only)    Standing Status:   Future    Standing Expiration Date:   08/06/2023   Iron and TIBC    Standing Status:   Future    Standing Expiration Date:   08/06/2023   Ferritin    Standing Status:   Future    Standing Expiration Date:   08/06/2023   Ambulatory referral to Gastroenterology    Referral Priority:   Routine    Referral Type:   Consultation    Referral Reason:   Specialty Services Required    Referred to Provider:   Wyline Mood, MD    Number of Visits Requested:   1   Ambulatory referral to Gastroenterology    Standing Status:   Future    Standing Expiration Date:   02/06/2023    Referral Priority:   Routine  Referral Type:   Consultation    Referral Reason:   Specialty Services Required    Referred to Provider:   Wyline Mood, MD    Number of Visits Requested:   1    Follow up 1 month All questions were answered. The patient knows to call the clinic with any problems, questions or concerns.  [name], MD, PhD [facility] Hematology Oncology 08/06/2022    CHIEF COMPLAINTS/PURPOSE OF CONSULTATION:  CLL  HISTORY OF PRESENTING ILLNESS:  Paul Ana [name] 76 y.o. male presents for follow up of CLL I have reviewed his chart and materials related to his cancer extensively and collaborated history with the patient. Summary of oncologic history is as follows: Oncology History  CLL (chronic lymphocytic leukemia) (HCC)  12/25/2021 -  12/26/2021 Hospital Admission   Patient was admitted due to pain and swelling of right ankle, right knee and left wrist.  Patient was found to have WBC 30.4, with neutrophil 29% and lymphocyte 2%.  Peripheral smear showed leukocytosis with slight left shift in myeloid series and lymphocytosis with abnormal morphology.  Uric acid 5.5, worsening renal function with creatinine 1.35, BUN 18, GFR 55 (baseline creatinine 1.08 on 07/06/2021), lactic acid 1.1.  ESR normal, uric acid 5.5 normal, CRP 22.2, LDH 182  Orthopedic surgery was consulted s/p arthrocentesis, cell count 296 with 72% neutrophil, no crystal. Fluid negative for growth.  Patient received cefepime and vancomycin while in the hospital, Patient was discharged on Keflex for 5 days.   12/28/2021 Cancer Staging   Staging form: Chronic Lymphocytic Leukemia / Small Lymphocytic Lymphoma, AJCC 8th Edition - Clinical stage from 12/28/2021: Modified Rai Stage III (Modified Rai risk: High, Lymphocytosis: Present, Adenopathy: Present, Organomegaly: Absent, Anemia: Present, Thrombocytopenia: Absent) - Signed by [name], MD on 01/20/2022 Stage prefix: Initial diagnosis Hemoglobin (Hgb) (g/dL): 16.1   09[birth year] Initial Diagnosis   CLL (chronic lymphocytic leukemia)   12/27/21 peripheral blood flowcytometry showed Involvement by CD5+, CD23+, CD20+, CD22+ clonal B cell population, phenotype typical for 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    01/05/2022 Imaging   CT chest abdomen pelvis wo contrast 1. Multiple prominent borderline enlarged and mildly enlarged lymph nodes, most evident in the low anatomic pelvis, as above, compatible with reported clinical history of CLL. 2. There also several small pulmonary nodules in the lungs measuring 5 mm or less in size. This is nonspecific, but  statistically likely benign. No follow-up needed if patient is low-risk (and has no known or suspected primary neoplasm). Non-contrast chest CT can be considered in 12 months if patient is high-risk. This recommendation 3. Aortic atherosclerosis, in addition to left main and 2 vessel coronary artery disease. Please note that although the presence of coronary artery calcium documents the presence of coronary artery disease, the severity of this disease and any potential stenosis cannot be assessed on this non-gated CT examination. Assessment for  potential risk factor modification, dietary therapy or pharmacologic therapy may be warranted, if clinically indicated. 4. There are calcifications of the aortic valve. Echocardiographic correlation for evaluation of potential valvular dysfunction may be warranted if clinically indicated. 5. 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 6. Diverticulosis without evidence of acute diverticulitis at this time. 7. Mild cardiomegaly.     INTERVAL HISTORY Paul Dugan [name] is a 77 y.o. male who has above history reviewed by me  today presents for follow up visit for CLL. He takes Zanubrutinib 160 mg twice daily Tolerates well, appetite is fair.  Patient is a poor historian no new complaints. Weight is stable. Denies hematochezia, hematuria, hematemesis, epistaxis, black tarry stool or easy bruising.    MEDICAL HISTORY:  Past Medical History:  Diagnosis Date   Acute prostatitis    BPH (benign prostatic hyperplasia)    Chronic kidney disease    CLL (chronic lymphocytic leukemia) (HCC)    Dysplasia of prostate    Erectile dysfunction    HTN (hypertension)    Hypogonadism in male    Orchitis and epididymitis    Over weight    Palindromic rheumatism, hand    Prostatitis    Rectum pain    Testicle swelling    Testicle tenderness    Testicular mass     SURGICAL HISTORY: Past Surgical History:  Procedure Laterality Date   BACK  SURGERY     head surgery     from fall  drilled hole in brain to relieve pressure    SOCIAL HISTORY: Social History   Socioeconomic History   Marital status: Married    Spouse name: Not on file   Number of children: Not on file   Years of education: Not on file   Highest education level: 5th grade  Occupational History   Not on file  Tobacco Use   Smoking status: Former   Smokeless tobacco: Never   Tobacco comments:    quit 30 years ago  Vaping Use   Vaping status: Never Used  Substance and Sexual Activity   Alcohol use: No    Alcohol/week: 0.0 standard drinks of alcohol   Drug use: No   Sexual activity: Not on file  Other Topics Concern   Not on file  Social History Narrative   Not on file   Social Determinants of Health   Financial Resource Strain: Medium Risk (07/26/2022)   Overall Financial Resource Strain (CARDIA)    Difficulty of Paying Living Expenses: Somewhat hard  Food Insecurity: Food Insecurity Present (07/26/2022)   Hunger Vital Sign    Worried About Running Out of Food in the Last Year: Sometimes true    Ran Out of Food in the Last Year: Never true  Transportation Needs: No Transportation Needs (07/26/2022)   PRAPARE - Administrator, Civil Service (Medical): No    Lack of Transportation (Non-Medical): No  Physical Activity: Unknown (07/26/2022)   Exercise Vital Sign    Days of Exercise per Week: Patient declined    Minutes of Exercise per Session: 0 min  Stress: Stress Concern Present (07/26/2022)   [employer] of Occupational Health - Occupational Stress Questionnaire    Feeling of Stress : To some extent  Social Connections: Moderately Integrated (07/26/2022)   Social Connection and Isolation Panel [NHANES]    Frequency of Communication with Friends and Family: More than three times a week    Frequency of Social Gatherings with Friends and Family: Patient declined    Attends Religious Services: More than 4 times per year    Active Member  of [employer] or Organizations: No    Attends Banker Meetings: Never    Marital Status: Married  Catering manager Violence: Not At Risk (01/22/2022)   Humiliation, Afraid, Rape, and Kick questionnaire    Fear of Current or Ex-Partner: No    Emotionally Abused: No    Physically Abused: No    Sexually Abused: No  FAMILY HISTORY: Family History  Problem Relation Age of Onset   Cancer Maternal Aunt    Prostate cancer Neg Hx    Kidney disease Neg Hx    Kidney cancer Neg Hx    Bladder Cancer Neg Hx     ALLERGIES:  has No Known Allergies.  MEDICATIONS:  Current Outpatient Medications  Medication Sig Dispense Refill   amLODipine (NORVASC) 10 MG tablet Take 10 mg by mouth daily.     hydrALAZINE (APRESOLINE) 50 MG tablet Take 1 tablet (50 mg total) by mouth in the morning and at bedtime. 60 tablet 3   levothyroxine (SYNTHROID) 200 MCG tablet Take 200 mcg by mouth daily.     metFORMIN (GLUCOPHAGE) 500 MG tablet Take 500 mg by mouth 2 (two) times daily with a meal.     oxyCODONE-acetaminophen (PERCOCET) 10-325 MG tablet Take 1 tablet by mouth every 8 (eight) hours as needed. 90 tablet 0   pregabalin (LYRICA) 100 MG capsule Take 1 capsule (100 mg total) by mouth 2 (two) times daily. 60 capsule 5   rosuvastatin (CRESTOR) 10 MG tablet Take 1 tablet by mouth once daily 90 tablet 0   trospium (SANCTURA) 20 MG tablet Take 1 tablet (20 mg total) by mouth 2 (two) times daily. 60 tablet 0   zanubrutinib (BRUKINSA) 80 MG capsule Take 2 capsules (160 mg total) by mouth 2 (two) times daily. 120 capsule 1   No current facility-administered medications for this visit.    Review of Systems  Constitutional:  Positive for fatigue. Negative for appetite change, chills, fever and unexpected weight change.  HENT:   Negative for hearing loss and voice change.   Eyes:  Negative for eye problems and icterus.  Respiratory:  Negative for chest tightness, cough and shortness of breath.    Cardiovascular:  Negative for chest pain and leg swelling.  Gastrointestinal:  Negative for abdominal distention and abdominal pain.  Endocrine: Negative for hot flashes.  Genitourinary:  Negative for difficulty urinating, dysuria and frequency.   Musculoskeletal:  Positive for arthralgias.  Skin:  Negative for itching and rash.  Neurological:  Negative for light-headedness and numbness.  Hematological:  Negative for adenopathy. Does not bruise/bleed easily.  Psychiatric/Behavioral:  Negative for confusion.      PHYSICAL EXAMINATION: ECOG PERFORMANCE STATUS: 1 - Symptomatic but completely ambulatory  Vitals:   08/06/22 1010  BP: (!) 144/75  Pulse: (!) 57  Resp: 18  Temp: 98 F (36.7 C)  SpO2: 100%   Filed Weights   08/06/22 1010  Weight: 237 lb (107.5 kg)    Physical Exam Constitutional:      General: He is not in acute distress.    Appearance: He is obese. He is not diaphoretic.  HENT:     Head: Normocephalic and atraumatic.  Eyes:     General: No scleral icterus.    Pupils: Pupils are equal, round, and reactive to light.  Cardiovascular:     Rate and Rhythm: Normal rate.     Heart sounds: No murmur heard. Pulmonary:     Effort: Pulmonary effort is normal. No respiratory distress.     Breath sounds: No wheezing.  Abdominal:     General: There is no distension.     Palpations: Abdomen is soft.     Tenderness: There is no abdominal tenderness.  Musculoskeletal:     Cervical back: Normal range of motion and neck supple.  Skin:    General: Skin is warm and dry.  Findings: No erythema.  Neurological:     Mental Status: He is alert and oriented to person, place, and time. Mental status is at baseline.     Cranial Nerves: No cranial nerve deficit.     Motor: No abnormal muscle tone.  Psychiatric:        Mood and Affect: Mood and affect normal.      LABORATORY DATA:  I have reviewed the data as listed    Latest Ref Rng & Units 08/06/2022    9:27 AM  07/21/2022    1:41 PM 06/11/2022   12:36 PM  CBC  WBC 4.0 - 10.5 K/uL 7.6  7.5  7.6   Hemoglobin 13.0 - 17.0 g/dL 8.1  7.9  8.4   Hematocrit 39.0 - 52.0 % 26.4  24.9  26.7   Platelets 150 - 400 K/uL 123  183  192       Latest Ref Rng & Units 08/06/2022    9:27 AM 07/21/2022    1:41 PM 06/11/2022   12:36 PM  CMP  Glucose 70 - 99 mg/dL 604  540  981   BUN 8 - 23 mg/dL 16  28  18    Creatinine 0.61 - 1.24 mg/dL 1.91  4.78  2.95   Sodium 135 - 145 mmol/L 137  143  142   Potassium 3.5 - 5.1 mmol/L 3.7  3.4  3.6   Chloride 98 - 111 mmol/L 106  110  108   CO2 22 - 32 mmol/L 25  24  26    Calcium 8.9 - 10.3 mg/dL 8.1  8.9  8.4   Total Protein 6.5 - 8.1 g/dL 6.4  6.8  6.5   Total Bilirubin 0.3 - 1.2 mg/dL 0.4  1.2  0.8   Alkaline Phos 38 - 126 U/L 53  64  68   AST 15 - 41 U/L 45  57  82   ALT 0 - 44 U/L 23  28  44      RADIOGRAPHIC STUDIES: I have personally reviewed the radiological images as listed and agreed with the findings in the report. ECHOCARDIOGRAM COMPLETE  Result Date: 08/06/2022    ECHOCARDIOGRAM REPORT   Patient Name:   Paul Mcclelland [name] Date of Exam: 08/05/2022 Medical Rec #:  [id number]                 Height:       72.0 in Accession #:    [id number]                Weight:       237.4 lb Date of Birth:  May 18, 1945                  BSA:          2.292 m Patient Age:    77 years                  BP:           175/84 mmHg Patient Gender: M                         HR:           60 bpm. Exam Location:  [place] Procedure: 2D Echo, 3D Echo, Cardiac Doppler, Color Doppler and Strain Analysis Indications:    I44.7 LBBB  History:        Patient has no prior history of Echocardiogram examinations.  Arrythmias:LBBB, Signs/Symptoms:Dizziness/Lightheadedness; Risk                 Factors:Hypertension, Former Smoker, Diabetes and Sleep Apnea.  Sonographer:    Ilda Mori RDCS, MHA Referring Phys: [id number] BRIAN AGBOR-ETANG IMPRESSIONS  1. Left ventricular ejection  fraction, by estimation, is 45 to 50%. Left ventricular ejection fraction by 3D volume is 47 %. The left ventricle has mildly decreased function. The left ventricle has no regional wall motion abnormalities. 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 Left ventricular diastolic parameters are consistent with Grade I diastolic dysfunction (impaired relaxation). The average left ventricular global longitudinal strain is -18.8 %. Consider w/u for [facility] if clinically indicated.  2. Right ventricular systolic function is normal. The right ventricular size is normal. Tricuspid regurgitation signal is inadequate for assessing PA pressure.  3. Left atrial size was severely dilated.  4. Right atrial size was mildly dilated.  5. A small pericardial effusion is present. There is no evidence of cardiac tamponade.  6. The mitral valve is normal in structure. Mild mitral valve regurgitation. No evidence of mitral stenosis.  7. The aortic valve is tricuspid. Aortic valve regurgitation is not visualized. Aortic valve sclerosis/calcification is present, without any evidence of aortic stenosis.  8. The inferior vena cava is normal in size with greater than 50% respiratory variability, suggesting right atrial pressure of 3 mmHg. FINDINGS  Left Ventricle: Left ventricular ejection fraction, by estimation, is 45 to 50%. Left ventricular ejection fraction by 3D volume is 47 %. The left ventricle has mildly decreased function. The left ventricle has no regional wall motion abnormalities. The  average left ventricular global longitudinal strain is -18.8 %. The left ventricular internal cavity size was normal in size. There is moderate concentric left ventricular hypertrophy. Left ventricular diastolic parameters are consistent with Grade I diastolic dysfunction (impaired relaxation). Right Ventricle: The right ventricular size is normal. No increase in right ventricular wall thickness. Right  ventricular systolic function is normal. Tricuspid regurgitation signal is inadequate for assessing PA pressure. Left Atrium: Left atrial size was severely dilated. Right Atrium: Right atrial size was mildly dilated. Pericardium: A small pericardial effusion is present. There is no evidence of cardiac tamponade. Mitral Valve: The mitral valve is normal in structure. There is mild calcification of the mitral valve leaflet(s). Mild mitral valve regurgitation. No evidence of mitral valve stenosis. Tricuspid Valve: The tricuspid valve is normal in structure. Tricuspid valve regurgitation is mild . No evidence of tricuspid stenosis. Aortic Valve: The aortic valve is tricuspid. Aortic valve regurgitation is not visualized. Aortic valve sclerosis/calcification is present, without any evidence of aortic stenosis. Pulmonic Valve: The pulmonic valve was normal in structure. Pulmonic valve regurgitation is not visualized. No evidence of pulmonic stenosis. Aorta: The aortic root is normal in size and structure. Venous: The inferior vena cava is normal in size with greater than 50% respiratory variability, suggesting right atrial pressure of 3 mmHg. IAS/Shunts: No atrial level shunt detected by color flow Doppler.  LEFT VENTRICLE PLAX 2D LVIDd:         5.00 cm         Diastology LVIDs:         4.00 cm         LV e' medial:    4.24 cm/s LV PW:         1.70 cm         LV E/e' medial:  18.8 LV IVS:  1.70 cm         LV e' lateral:   5.87 cm/s                                LV E/e' lateral: 13.6                                 2D                                Longitudinal                                Strain                                2D Strain GLS  -18.8 %                                Avg:                                 3D Volume EF                                LV 3D EF:    Left                                             ventricul                                             ar                                              ejection                                             fraction                                             by 3D                                             volume is  47 %.                                 3D Volume EF:                                3D EF:        47 %                                LV EDV:       204 ml                                LV ESV:       108 ml                                LV SV:        95 ml RIGHT VENTRICLE RV Basal diam:  2.80 cm RV Mid diam:    2.90 cm RV S prime:     20.50 cm/s TAPSE (M-mode): 2.3 cm LEFT ATRIUM              Index        RIGHT ATRIUM           Index LA diam:        6.10 cm  2.66 cm/m   RA Area:     24.80 cm LA Vol (A2C):   182.0 ml 79.42 ml/m  RA Volume:   72.00 ml  31.42 ml/m LA Vol (A4C):   120.0 ml 52.37 ml/m LA Biplane Vol: 150.0 ml 65.46 ml/m   AORTA Ao Root diam: 3.60 cm Ao Asc diam:  3.70 cm MITRAL VALVE MV Area (PHT): 2.80 cm MV Decel Time: 271 msec MV E velocity: 79.70 cm/s MV A velocity: 99.00 cm/s MV E/A ratio:  0.81 Julien Nordmann MD Electronically signed by Julien Nordmann MD Signature Date/Time: 08/06/2022/8:50:22 AM    Final

## 2022-08-09 ENCOUNTER — Encounter: Payer: Self-pay | Admitting: Medical

## 2022-08-09 ENCOUNTER — Other Ambulatory Visit
Admission: RE | Admit: 2022-08-09 | Discharge: 2022-08-09 | Disposition: A | Discharge status: Home / Self Care | Payer: No Typology Code available for payment source | Attending: Medical | Admitting: Medical

## 2022-08-09 ENCOUNTER — Ambulatory Visit: Payer: No Typology Code available for payment source | Attending: Cardiology | Admitting: Medical

## 2022-08-09 ENCOUNTER — Other Ambulatory Visit: Payer: Self-pay

## 2022-08-09 ENCOUNTER — Other Ambulatory Visit (HOSPITAL_COMMUNITY): Payer: Self-pay

## 2022-08-09 VITALS — BP 148/78 | HR 78 | Ht 72.0 in | Wt 236.0 lb

## 2022-08-09 DIAGNOSIS — I429 Cardiomyopathy, unspecified: Secondary | ICD-10-CM | POA: Insufficient documentation

## 2022-08-09 DIAGNOSIS — R931 Abnormal findings on diagnostic imaging of heart and coronary circulation: Secondary | ICD-10-CM | POA: Diagnosis not present

## 2022-08-09 DIAGNOSIS — E782 Mixed hyperlipidemia: Secondary | ICD-10-CM | POA: Diagnosis not present

## 2022-08-09 DIAGNOSIS — I2510 Atherosclerotic heart disease of native coronary artery without angina pectoris: Secondary | ICD-10-CM

## 2022-08-09 DIAGNOSIS — I5022 Chronic systolic (congestive) heart failure: Secondary | ICD-10-CM | POA: Diagnosis not present

## 2022-08-09 DIAGNOSIS — I447 Left bundle-branch block, unspecified: Secondary | ICD-10-CM

## 2022-08-09 DIAGNOSIS — I10 Essential (primary) hypertension: Secondary | ICD-10-CM | POA: Diagnosis not present

## 2022-08-09 LAB — CBC
HCT: 24.1 % — ABNORMAL LOW (ref 39.0–52.0)
Hemoglobin: 7.7 g/dL — ABNORMAL LOW (ref 13.0–17.0)
MCH: 28.8 pg (ref 26.0–34.0)
MCHC: 32 g/dL (ref 30.0–36.0)
MCV: 90.3 fL (ref 80.0–100.0)
Platelets: 142 10*3/uL — ABNORMAL LOW (ref 150–400)
RBC: 2.67 MIL/uL — ABNORMAL LOW (ref 4.22–5.81)
RDW: 19.2 % — ABNORMAL HIGH (ref 11.5–15.5)
WBC: 7.6 10*3/uL (ref 4.0–10.5)
nRBC: 0 % (ref 0.0–0.2)

## 2022-08-09 MED ORDER — DAPAGLIFLOZIN PROPANEDIOL 10 MG PO TABS: 10.0000 mg | ORAL_TABLET | Freq: Every day | ORAL | 3 refills | Status: DC

## 2022-08-09 MED ORDER — METOPROLOL TARTRATE 100 MG PO TABS: 100.0000 mg | ORAL_TABLET | Freq: Once | ORAL | 0 refills | Status: DC

## 2022-08-09 MED ORDER — ASPIRIN 81 MG PO TBEC: 81.0000 mg | DELAYED_RELEASE_TABLET | Freq: Every day | ORAL | Status: AC

## 2022-08-09 MED ORDER — ROSUVASTATIN CALCIUM 40 MG PO TABS: 40.0000 mg | ORAL_TABLET | Freq: Every day | ORAL | 3 refills | Status: DC

## 2022-08-09 MED ORDER — METOPROLOL SUCCINATE ER 25 MG PO TB24: 25.0000 mg | ORAL_TABLET | Freq: Every day | ORAL | 3 refills | Status: DC

## 2022-08-09 NOTE — Patient Instructions (Addendum)
Medication Instructions:  STOP: Amlodipine  Increase: Crestor (Rosuvastatin) 40 mg once daily  Start: Farxiga 10 mg once daily Aspirin 81 mg once daily Metoprolol Succinate 25 mg once daily    *If you need a refill on your cardiac medications before your next appointment, please call your pharmacy*   Lab Work: Your provider would like for you to have following labs drawn: BMP, CBC.   Please go to the [facility] entrance and check in at the front desk.  You do not need an appointment.  They are open from 7am-6 pm.   If you have labs (blood work) drawn today and your tests are completely normal, you will receive your results only by: MyChart Message (if you have MyChart) OR A paper copy in the mail If you have any lab test that is abnormal or we need to change your treatment, we will call you to review the results.   Testing/Procedures:   Your cardiac CT will be scheduled at one of the below locations:    [facility] [address] [place], [place] [address] [phone]  OR   [facility] [address] [place], [place] [address] [phone]    If scheduled at [facility] or [facility], please arrive 15 mins early for check-in and test prep. There is spacious parking and easy access to the radiology department from the [facility] entrance. Please enter here and check-in with the desk attendant.    Please follow these instructions carefully (unless otherwise directed):  An IV will be required for this test and Nitroglycerin will be given.  Hold all erectile dysfunction medications at least 3 days (72 hrs) prior to test. (Ie viagra, cialis, sildenafil, tadalafil, etc)   On the Night Before the Test: Be sure to Drink plenty of water. Do not consume any caffeinated/decaffeinated beverages or chocolate 12 hours prior to your  test. Do not take any antihistamines 12 hours prior to your test.   On the Day of the Test: Drink plenty of water until 1 hour prior to the test. Do not eat any food 1 hour prior to test. You may take your regular medications prior to the test.  Take metoprolol (Lopressor) two hours prior to test.        After the Test: Drink plenty of water. After receiving IV contrast, you may experience a mild flushed feeling. This is normal. On occasion, you may experience a mild rash up to 24 hours after the test. This is not dangerous. If this occurs, you can take Benadryl 25 mg and increase your fluid intake. If you experience trouble breathing, this can be serious. If it is severe call 911 IMMEDIATELY. If it is mild, please call our office. If you take any of these medications: Glipizide/Metformin, Avandament, Glucavance, please do not take 48 hours after completing test unless otherwise instructed.  We will call to schedule your test 2-4 weeks out understanding that some insurance companies will need an authorization prior to the service being performed.   For more information and frequently asked questions, please visit our website : [email or web address]  For non-scheduling related questions, please contact the cardiac imaging nurse navigator should you have any questions/concerns: Rockwell Alexandria, Cardiac Imaging Nurse Navigator Larey Brick, Cardiac Imaging Nurse Navigator [facility] Direct Office Dial: [phone]   For scheduling needs, including cancellations and rescheduling, please call [place], [phone].  You are scheduled for Cardiac MRI arrive 30-45 minutes prior to test start time once you're scheduled.  [facility] [address] [place], [place] [address] [phone]  Please go to the [facility] and check-in with the desk attendant.   Magnetic resonance imaging (MRI) is a painless  test that produces images of the inside of the body without using Xrays.  During an MRI, strong magnets and radio waves work together in a Data processing manager to form detailed images.   MRI images may provide more details about a medical condition than X-rays, CT scans, and ultrasounds can provide.  You may be given earphones to listen for instructions.  You may eat a light breakfast and take medications as ordered with the exception of furosemide, hydrochlorothiazide, or spironolactone(fluid pill, other). Please avoid stimulants for 12 hr prior to test. (Ie. Caffeine, nicotine, chocolate, or antihistamine medications)  If a contrast material will be used, an IV will be inserted into one of your veins. Contrast material will be injected into your IV. It will leave your body through your urine within a day. You may be told to drink plenty of fluids to help flush the contrast material out of your system.  You will be asked to remove all metal, including: Watch, jewelry, and other metal objects including hearing aids, hair pieces and dentures. Also wearable glucose monitoring systems (ie. Freestyle Libre and Omnipods) (Braces and fillings normally are not a problem.)   TEST WILL TAKE APPROXIMATELY 1 HOUR  PLEASE NOTIFY SCHEDULING AT LEAST 24 HOURS IN ADVANCE IF YOU ARE UNABLE TO KEEP YOUR APPOINTMENT. [phone]  For more information and frequently asked questions, please visit our website : [email or web address]  Please call Rockwell Alexandria, cardiac imaging nurse navigator with any questions/concerns. Rockwell Alexandria RN Navigator Cardiac Imaging Larey Brick RN Navigator Cardiac Imaging [facility] [phone] Office     Follow-Up: At [facility], you and your health needs are our priority.  As part of our continuing mission to provide you with exceptional heart care, we have created designated Provider Care Teams.  These Care Teams include your  primary Cardiologist (physician) and Advanced Practice Providers (APPs -  Physician Assistants and Nurse Practitioners) who all work together to provide you with the care you need, when you need it.  We recommend signing up for the patient portal called "MyChart".  Sign up information is provided on this After Visit Summary.  MyChart is used to connect with patients for Virtual Visits (Telemedicine).  Patients are able to view lab/test results, encounter notes, upcoming appointments, etc.  Non-urgent messages can be sent to your provider as well.   To learn more about what you can do with MyChart, go to [email or web address].    Your next appointment:   1 month(s)  Provider:   You may see Debbe Odea, MD or one of the following Advanced Practice Providers on your designated Care Team:   Cadence Nerstrand, [place]

## 2022-08-09 NOTE — Progress Notes (Signed)
Cardiology Office Note:    Date:  08/09/2022   ID:  Paul Ana [name], DOB April 10, 1945, MRN [id number]  PCP:  Wilford Corner, PA-C  [facility] Cardiologist:  Debbe Odea, MD  [facility] Electrophysiologist:  None   Referring MD: No ref. provider found   Chief Complaint: 2 month follow-up  History of Present Illness:    Paul Tugwell [name] is a 77 y.o. male with a hx of HTN, diabetes, CLL s/p chemo, CKD stag 3, IDA who presents for follow-up.   The patient was initially seen 05/2022 for HTN. He was started on Diovan 160mg  daily. EKG showed LBBB and an echo was ordered.   Echo showed LVEF 45-50%, no WMA, moderate to severe LVH with speckled appearance, consider for amyloidosis, severely dilated left atrium, mildly dilated right atrium, small pericardial effusion with no tamponade, mild MR.  Today, the echo was reviewed. No h/o drug use or MI. He denies chest pain. He has shortness of breath when he walks and lays down. He has occasional lower leg edema. He is unsure if he is taking diovan.  Past Medical History:  Diagnosis Date   Acute prostatitis    BPH (benign prostatic hyperplasia)    Chronic kidney disease    CLL (chronic lymphocytic leukemia) (HCC)    Dysplasia of prostate    Erectile dysfunction    HTN (hypertension)    Hypogonadism in male    Orchitis and epididymitis    Over weight    Palindromic rheumatism, hand    Prostatitis    Rectum pain    Testicle swelling    Testicle tenderness    Testicular mass     Past Surgical History:  Procedure Laterality Date   BACK SURGERY     head surgery     from fall  drilled hole in brain to relieve pressure    Current Medications: Current Meds  Medication Sig   hydrALAZINE (APRESOLINE) 50 MG tablet Take 1 tablet (50 mg total) by mouth in the morning and at bedtime.   levothyroxine (SYNTHROID) 200 MCG tablet Take 200 mcg by mouth daily.   metFORMIN (GLUCOPHAGE) 500 MG tablet Take 500 mg  by mouth 2 (two) times daily with a meal.   oxyCODONE-acetaminophen (PERCOCET) 10-325 MG tablet Take 1 tablet by mouth every 8 (eight) hours as needed.   pregabalin (LYRICA) 100 MG capsule Take 1 capsule (100 mg total) by mouth 2 (two) times daily.   trospium (SANCTURA) 20 MG tablet Take 1 tablet (20 mg total) by mouth 2 (two) times daily.   zanubrutinib (BRUKINSA) 80 MG capsule Take 2 capsules (160 mg total) by mouth 2 (two) times daily.   [DISCONTINUED] amLODipine (NORVASC) 10 MG tablet Take 10 mg by mouth daily.   [DISCONTINUED] rosuvastatin (CRESTOR) 10 MG tablet Take 1 tablet by mouth once daily     Allergies:   Patient has no known allergies.   Social History   Socioeconomic History   Marital status: Married    Spouse name: Not on file   Number of children: Not on file   Years of education: Not on file   Highest education level: 5th grade  Occupational History   Not on file  Tobacco Use   Smoking status: Former   Smokeless tobacco: Never   Tobacco comments:    quit 30 years ago  Vaping Use   Vaping status: Never Used  Substance and Sexual Activity   Alcohol use: No    Alcohol/week:  0.0 standard drinks of alcohol   Drug use: No   Sexual activity: Not on file  Other Topics Concern   Not on file  Social History Narrative   Not on file   Social Determinants of Health   Financial Resource Strain: Medium Risk (07/26/2022)   Overall Financial Resource Strain (CARDIA)    Difficulty of Paying Living Expenses: Somewhat hard  Food Insecurity: Food Insecurity Present (07/26/2022)   Hunger Vital Sign    Worried About Running Out of Food in the Last Year: Sometimes true    Ran Out of Food in the Last Year: Never true  Transportation Needs: No Transportation Needs (07/26/2022)   PRAPARE - Administrator, Civil Service (Medical): No    Lack of Transportation (Non-Medical): No  Physical Activity: Unknown (07/26/2022)   Exercise Vital Sign    Days of Exercise per Week:  Patient declined    Minutes of Exercise per Session: 0 min  Stress: Stress Concern Present (07/26/2022)   [employer] of Occupational Health - Occupational Stress Questionnaire    Feeling of Stress : To some extent  Social Connections: Moderately Integrated (07/26/2022)   Social Connection and Isolation Panel [NHANES]    Frequency of Communication with Friends and Family: More than three times a week    Frequency of Social Gatherings with Friends and Family: Patient declined    Attends Religious Services: More than 4 times per year    Active Member of [employer] or Organizations: No    Attends Banker Meetings: Never    Marital Status: Married     Family History: The patient's family history includes Cancer in his maternal aunt. There is no history of Prostate cancer, Kidney disease, Kidney cancer, or Bladder Cancer.  ROS:   Please see the history of present illness.     All other systems reviewed and are negative.  EKGs/Labs/Other Studies Reviewed:    The following studies were reviewed today:  Echo 07/2022 1. Left ventricular ejection fraction, by estimation, is 45 to 50%. Left  ventricular ejection fraction by 3D volume is 47 %. The left ventricle has  mildly decreased function. The left ventricle has no regional wall motion  abnormalities. There is  moderate to severe concentric left ventricular hypertrophy, speckled  appearance. Left ventricular diastolic parameters are consistent with  Grade I diastolic dysfunction (impaired relaxation). The average left  ventricular global longitudinal strain is  -18.8 %. Consider w/u for [facility] if clinically indicated.   2. Right ventricular systolic function is normal. The right ventricular  size is normal. Tricuspid regurgitation signal is inadequate for assessing  PA pressure.   3. Left atrial size was severely dilated.   4. Right atrial size was mildly dilated.   5. A small pericardial effusion is present. There is no  evidence of  cardiac tamponade.   6. The mitral valve is normal in structure. Mild mitral valve  regurgitation. No evidence of mitral stenosis.   7. The aortic valve is tricuspid. Aortic valve regurgitation is not  visualized. Aortic valve sclerosis/calcification is present, without any  evidence of aortic stenosis.   8. The inferior vena cava is normal in size with greater than 50%  respiratory variability, suggesting right atrial pressure of 3 mmHg.   EKG:  EKG is not ordered today.    Recent Labs: 08/06/2022: ALT 23; BUN 16; Creatinine 1.15; Hemoglobin 8.1; Platelet Count 123; Potassium 3.7; Sodium 137  Recent Lipid Panel    Component Value Date/Time  CHOL 181 07/06/2021 1030   TRIG 60 07/06/2021 1030   HDL 61 07/06/2021 1030   CHOLHDL 3.0 07/06/2021 1030   LDLCALC 105 (H) 07/06/2021 1030    Physical Exam:    VS:  BP (!) 148/78 (BP Location: Left Arm, Patient Position: Sitting)   Pulse 78   Ht 6' (1.829 m)   Wt 236 lb (107 kg)   SpO2 98%   BMI 32.01 kg/m     Wt Readings from Last 3 Encounters:  08/09/22 236 lb (107 kg)  08/06/22 237 lb (107.5 kg)  08/05/22 237 lb (107.5 kg)     GEN:  Well nourished, well developed in no acute distress HEENT: Normal NECK: No JVD; No carotid bruits LYMPHATICS: No lymphadenopathy CARDIAC: RRR, no murmurs, rubs, gallops RESPIRATORY:  Clear to auscultation without rales, wheezing or rhonchi  ABDOMEN: Soft, non-tender, non-distended MUSCULOSKELETAL:  No edema; No deformity  SKIN: Warm and dry NEUROLOGIC:  Alert and oriented x 3 PSYCHIATRIC:  Normal affect   ASSESSMENT:    1. Heart failure with mildly reduced ejection fraction (HFmrEF) (HCC)   2. Cardiomyopathy, unspecified type (HCC)   3. Abnormal echocardiogram   4. Essential hypertension   5. LBBB (left bundle branch block)   6. Coronary artery disease involving native coronary artery of native heart without angina pectoris   7. Hyperlipidemia, mixed    PLAN:    In order  of problems listed above:  HFmrEF New Cardiomyopathy Echo showed LVEF 45-50% with speckled appearance. Unclear etiology of cardiomyopathy. Chest CTA in 2023 showed atherosis and plaque in the left main, left anterior descending and left Cx. He reports chronic dyspnea, Hgb 8.1 which is likely contributing. He has trace lower leg edema on exam. I will check Cardiac CTA and cMRI. I will stop amlodipine and start Farxiga 10mg  daily and Toprol 25mg  daily. BMET in 2 weeks. Unclear if he is taking Diovan. I asked him to bring in his medications at follow-up. We will see him back in 1 month.   HTN Stop amlodipine with low EF as above and start Toprol. Continue Hydralazine 50mg  BID.  LBBB Plan as above.   CAD CAD by Chest CTA in 2023. He denies chest pain. I will start ASA 81mg  daily. Continue Crestor and Toprol. Plan for Cardiac CTA as above.   HLD LDL 110. I will increase Crestor to 40mg  daily.  Disposition: Follow up in 1 month(s) with MD/APP    Signed, Jeweldean Drohan David Stall, PA-C  08/09/2022 11:22 AM    [facility]

## 2022-08-09 NOTE — Addendum Note (Signed)
Addended by: Vivi Barrack on: 08/09/2022 12:04 PM   Modules accepted: Orders

## 2022-08-09 NOTE — Addendum Note (Signed)
Addended by: Vivi Barrack on: 08/09/2022 04:59 PM   Modules accepted: Orders

## 2022-08-10 ENCOUNTER — Other Ambulatory Visit: Payer: Self-pay

## 2022-08-10 DIAGNOSIS — Z01818 Encounter for other preprocedural examination: Secondary | ICD-10-CM

## 2022-08-12 MED FILL — Iron Sucrose Inj 20 MG/ML (Fe Equiv): INTRAVENOUS | Qty: 10 | Status: AC

## 2022-08-13 ENCOUNTER — Inpatient Hospital Stay: Payer: No Typology Code available for payment source

## 2022-08-18 ENCOUNTER — Telehealth: Payer: Self-pay

## 2022-08-18 DIAGNOSIS — Z008 Encounter for other general examination: Secondary | ICD-10-CM | POA: Diagnosis not present

## 2022-08-18 DIAGNOSIS — E119 Type 2 diabetes mellitus without complications: Secondary | ICD-10-CM | POA: Diagnosis not present

## 2022-08-18 NOTE — Telephone Encounter (Signed)
Received surgery clearance form from Debbra Riding, PA-C: "Unable to clear. Patient no showed to physical on 08/05/2022. Unclear of what medications/dosages he is taking. Patient was to bring meds to his appointment"   I also noticed a message in his chart that he may have started farxiga since he saw [place]. This needs to be held 3 days prior to surgery.  I left a message on pt's voicemail that he needs to contact his PCP immediately to schedule an appt and to contact me to discuss his medication.  I also sent a mychart message.

## 2022-08-19 ENCOUNTER — Encounter: Payer: Self-pay | Admitting: Student in an Organized Health Care Education/Training Program

## 2022-08-19 ENCOUNTER — Ambulatory Visit (HOSPITAL_BASED_OUTPATIENT_CLINIC_OR_DEPARTMENT_OTHER)
Payer: No Typology Code available for payment source | Admitting: Student in an Organized Health Care Education/Training Program

## 2022-08-19 VITALS — HR 84 | Temp 97.9°F | Resp 14 | Ht 73.0 in | Wt 237.0 lb

## 2022-08-19 DIAGNOSIS — M5412 Radiculopathy, cervical region: Secondary | ICD-10-CM | POA: Insufficient documentation

## 2022-08-19 DIAGNOSIS — G8929 Other chronic pain: Secondary | ICD-10-CM | POA: Insufficient documentation

## 2022-08-19 DIAGNOSIS — G894 Chronic pain syndrome: Secondary | ICD-10-CM | POA: Insufficient documentation

## 2022-08-19 DIAGNOSIS — M48062 Spinal stenosis, lumbar region with neurogenic claudication: Secondary | ICD-10-CM | POA: Insufficient documentation

## 2022-08-19 DIAGNOSIS — C182 Malignant neoplasm of ascending colon: Secondary | ICD-10-CM | POA: Diagnosis not present

## 2022-08-19 DIAGNOSIS — M542 Cervicalgia: Secondary | ICD-10-CM | POA: Insufficient documentation

## 2022-08-19 DIAGNOSIS — M5416 Radiculopathy, lumbar region: Secondary | ICD-10-CM | POA: Insufficient documentation

## 2022-08-19 DIAGNOSIS — K921 Melena: Secondary | ICD-10-CM | POA: Diagnosis not present

## 2022-08-19 MED ORDER — OXYCODONE-ACETAMINOPHEN 10-325 MG PO TABS
1.0000 | ORAL_TABLET | Freq: Three times a day (TID) | ORAL | 0 refills | Status: DC | PRN
Start: 2022-09-22 — End: 2022-10-06

## 2022-08-19 MED ORDER — OXYCODONE-ACETAMINOPHEN 10-325 MG PO TABS
1.0000 | ORAL_TABLET | Freq: Three times a day (TID) | ORAL | 0 refills | Status: DC | PRN
Start: 2022-08-23 — End: 2022-09-22

## 2022-08-19 MED ORDER — OXYCODONE-ACETAMINOPHEN 10-325 MG PO TABS
1.0000 | ORAL_TABLET | Freq: Three times a day (TID) | ORAL | 0 refills | Status: DC | PRN
Start: 2022-10-22 — End: 2022-11-08

## 2022-08-19 NOTE — Progress Notes (Signed)
Nursing Pain Medication Assessment:  Safety precautions to be maintained throughout the outpatient stay will include: orient to surroundings, keep bed in low position, maintain call bell within reach at all times, provide assistance with transfer out of bed and ambulation.  Medication Inspection Compliance: Pill count conducted under aseptic conditions, in front of the patient. Neither the pills nor the bottle was removed from the patient's sight at any time. Once count was completed pills were immediately returned to the patient in their original bottle.  Medication: Oxycodone IR Pill/Patch Count:  5 of 90 pills remain Pill/Patch Appearance: Markings consistent with prescribed medication Bottle Appearance: Standard pharmacy container. Clearly labeled. Filled Date: 06 / 02 / 2024 Last Medication intake:  TodaySafety precautions to be maintained throughout the outpatient stay will include: orient to surroundings, keep bed in low position, maintain call bell within reach at all times, provide assistance with transfer out of bed and ambulation.

## 2022-08-19 NOTE — Telephone Encounter (Signed)
Pt called back in saying that he had reached out to his PCP's office and they told him the next available appt wasn't until mid to late September.

## 2022-08-19 NOTE — Telephone Encounter (Signed)
Patient called the office last night at 5:59pm, he states he is returning some ones call. [phone]

## 2022-08-19 NOTE — Progress Notes (Signed)
PROVIDER NOTE: Information contained herein reflects review and annotations entered in association with encounter. Interpretation of such information and data should be left to medically-trained personnel. Information provided to patient can be located elsewhere in the medical record under "Patient Instructions". Document created using STT-dictation technology, any transcriptional errors that may result from process are unintentional.    Patient: Paul Ana [name]  Service Category: E/M  Provider: Edward Jolly, MD  DOB: May 13, 1945  DOS: 08/19/2022  Referring Provider: No ref. provider found  MRN: [id number]  Specialty: Interventional Pain Management  PCP: Wilford Corner, PA-C  Type: Established Patient  Setting: Ambulatory outpatient    Location: Office  Delivery: Face-to-face     HPI  [name], a 77 y.o. year old male, is here today because of his Cervical radicular pain [M54.12]. Mr. Odriscoll primary complain today is Back Pain (lower)  Pertinent problems: Mr. Letts has Chronic back pain; Disorder of rotator cuff, left; Primary osteoarthritis of left shoulder; Left rotator cuff tear arthropathy; Spinal stenosis, lumbar region, with neurogenic claudication; Chronic left shoulder pain; Chronic pain syndrome; and Pain management contract signed on their pertinent problem list. Pain Assessment: Severity of Chronic pain is reported as a 6 /10. Location: Back Lower/denies. Onset: More than a month ago. Quality: Aching, Spasm, Discomfort. Timing: Constant. Modifying factor(s): Oxycodone. Vitals:  height is 6\' 1"  (1.854 m) and weight is 237 lb (107.5 kg). His temporal temperature is 97.9 F (36.6 C). His pulse is 84. His respiration is 14 and oxygen saturation is 100%.  BMI: Estimated body mass index is 31.27 kg/m as calculated from the following:   Height as of this encounter: 6\' 1"  (1.854 m).   Weight as of this encounter: 237 lb (107.5 kg). Last encounter:  05/25/2022. Last procedure: 06/16/2022.  Reason for encounter: medication management.   Patient presents today for medication management of his oxycodone.  He has upcoming cervical spine surgery with Dr. Marcell Barlow in August.  He continues Lyrica 100 mg twice a day as prescribed. We reviewed strategies for perioperative pain management given that he has a history of chronic pain syndrome and is on chronic opioid therapy.  ROS  Constitutional: Denies any fever or chills Gastrointestinal: No reported hemesis, hematochezia, vomiting, or acute GI distress Musculoskeletal:  neck and bilateral arm pain, low back and bilaterag pain Neurological:  parasthesias of bilateral hands and feet  Medication Review  aspirin EC, dapagliflozin propanediol, hydrALAZINE, levothyroxine, metFORMIN, metoprolol succinate, metoprolol tartrate, oxyCODONE-acetaminophen, pregabalin, rosuvastatin, trospium, and zanubrutinib  History Review  Allergy: Mr. Mihalik has No Known Allergies. Drug: Mr. Timson  reports no history of drug use. Alcohol:  reports no history of alcohol use. Tobacco:  reports that he has quit smoking. He has never used smokeless tobacco. Social: Mr. Olafson  reports that he has quit smoking. He has never used smokeless tobacco. He reports that he does not drink alcohol and does not use drugs. Medical:  has a past medical history of Acute prostatitis, BPH (benign prostatic hyperplasia), Chronic kidney disease, CLL (chronic lymphocytic leukemia) (HCC), Diabetes (HCC), Dysplasia of prostate, Erectile dysfunction, HTN (hypertension), Hypogonadism in male, Orchitis and epididymitis, Over weight, Palindromic rheumatism, hand, Prostatitis, Rectum pain, Testicle swelling, Testicle tenderness, and Testicular mass. Surgical: Mr. Tippin  has a past surgical history that includes Back surgery; head surgery; and Tonsillectomy. Family: family history includes Cancer in his maternal aunt.  Laboratory  Chemistry Profile   Renal Lab Results  Component Value Date   BUN 16  08/06/2022   CREATININE 1.15 08/06/2022   BCR NOT APPLICABLE 07/06/2021   GFRAA >60 10/22/2015   GFRNONAA >60 08/06/2022    Hepatic Lab Results  Component Value Date   AST 45 (H) 08/06/2022   ALT 23 08/06/2022   ALBUMIN 3.7 08/06/2022   ALKPHOS 53 08/06/2022   HCVAB NON REACTIVE 12/26/2021   LIPASE 357 02/25/2012    Electrolytes Lab Results  Component Value Date   NA 137 08/06/2022   K 3.7 08/06/2022   CL 106 08/06/2022   CALCIUM 8.1 (L) 08/06/2022    Bone Lab Results  Component Value Date   VD25OH 30.27 05/21/2022   TESTOSTERONE 429 04/19/2017    Inflammation (CRP: Acute Phase) (ESR: Chronic Phase) Lab Results  Component Value Date   CRP 22.2 (H) 12/25/2021   ESRSEDRATE 3 12/25/2021   LATICACIDVEN 2.1 (HH) 12/25/2021         Note: Above Lab results reviewed.  CLINICAL DATA:  No known injury, right arm pain and numbness   EXAM: MRI CERVICAL SPINE WITHOUT CONTRAST   TECHNIQUE: Multiplanar, multisequence MR imaging of the cervical spine was performed. No intravenous contrast was administered.   COMPARISON:  CT cervical spine 10/22/2015   FINDINGS: Alignment: Loss of the normal cervical lordosis with mild reversal. No static listhesis.   Vertebrae: No acute fracture, evidence of discitis, or aggressive bone lesion.   Cord: Normal signal and morphology.   Posterior Fossa, vertebral arteries, paraspinal tissues: Posterior fossa demonstrates no focal abnormality. Vertebral artery flow voids are maintained. Paraspinal soft tissues are unremarkable.   Disc levels:   Discs: Degenerative disease with disc height loss at C2-3, C3-4, C4-5, C5-6, C6-7 and C7-T1.   C2-3: Broad-based disc bulge. Mild left foraminal stenosis. No right foraminal stenosis. No spinal stenosis. Mild bilateral facet arthropathy.   C3-4: Broad-based disc bulge with a broad central disc protrusion. Bilateral  uncovertebral degenerative changes. Moderate bilateral facet arthropathy. Moderate-severe bilateral foraminal stenosis. Moderate spinal stenosis.   C4-5: Broad-based disc bulge flattening of the ventral cervical spinal cord. Moderate bilateral facet arthropathy. Bilateral uncovertebral degenerative changes. Moderate-severe bilateral foraminal stenosis.   C5-6: Broad-based disc bulge with a broad central disc protrusion. Bilateral uncovertebral degenerative changes. Moderate right and severe left foraminal stenosis. Mild spinal stenosis.   C6-7: Broad-based disc bulge. Mild left and moderate right foraminal stenosis. No spinal stenosis.   C7-T1: Mild broad-based disc bulge. Mild-moderate bilateral foraminal stenosis. No spinal stenosis.   IMPRESSION: 1. Diffuse cervical spine spondylosis as described above. 2. No acute osseous injury of the cervical spine.   CLINICAL DATA:  Low back pain with bilateral hip pain   EXAM: MRI LUMBAR SPINE WITHOUT CONTRAST   TECHNIQUE: Multiplanar, multisequence MR imaging of the lumbar spine was performed. No intravenous contrast was administered.   COMPARISON:  07/06/2018   FINDINGS: Segmentation: Transitional anatomy with partial lumbarization of the S1 vertebral body.   Alignment: Grade 1 anterolisthesis of L3 on L4 and L4 on L5 secondary to facet disease.   Vertebrae: No acute fracture, evidence of discitis, or aggressive bone lesion.   Conus medullaris and cauda equina: Conus extends to the L1-2 level. Conus and cauda equina appear normal.   Paraspinal and other soft tissues: No acute paraspinal abnormality.   Disc levels:   Disc spaces: Degenerative disease with disc height loss at L3-4 and L4-5. Disc desiccation at L5-S1.   T12-L1: No significant disc bulge. No neural foraminal stenosis. No central canal stenosis.   L1-L2: No significant disc  bulge. Mild bilateral facet arthropathy. No foraminal or central canal  stenosis.   L2-L3: Minimal broad-based disc bulge with a tiny left paracentral disc protrusion. Moderate bilateral facet arthropathy. Mild spinal stenosis. Bilateral lateral recess and stenosis. No foraminal stenosis.   L3-L4: Broad-based disc bulge. Severe bilateral facet arthropathy. Severe spinal stenosis. Moderate bilateral foraminal stenosis.   L4-L5: Broad-based disc bulge. Prior laminectomy. Severe bilateral facet arthropathy and reactive marrow edema. Moderate spinal stenosis. Moderate bilateral foraminal stenosis.   L5-S1: Broad-based disc bulge with a small right subarticular disc protrusion. Mild bilateral facet arthropathy. No foraminal stenosis. Bilateral subarticular recess stenosis. Mild spinal stenosis.   IMPRESSION: 1. Lumbar spine spondylosis as described above, most severe at L3-4 and L4-5. 2. No acute osseous injury of the lumbar spine.     Electronically Signed   By: Elige Ko M.D.   On: 06/07/2022 08:46   Physical Exam  General appearance: Well nourished, well developed, and well hydrated. In no apparent acute distress Mental status: Alert, oriented x 3 (person, place, & time)       Respiratory: No evidence of acute respiratory distress Eyes: PERLA Vitals: Pulse 84   Temp 97.9 F (36.6 C) (Temporal)   Resp 14   Ht 6\' 1"  (1.854 m)   Wt 237 lb (107.5 kg)   SpO2 100%   BMI 31.27 kg/m  BMI: Estimated body mass index is 31.27 kg/m as calculated from the following:   Height as of this encounter: 6\' 1"  (1.854 m).   Weight as of this encounter: 237 lb (107.5 kg). Ideal: Ideal body weight: 79.9 kg (176 lb 2.4 oz) Adjusted ideal body weight: 90.9 kg (200 lb 7.8 oz)  Cervical Spine Area Exam  Skin & Axial Inspection: No masses, redness, edema, swelling, or associated skin lesions Alignment: Symmetrical Functional ROM: Pain restricted ROM, bilaterally Stability: No instability detected Muscle Tone/Strength: Functionally intact. No obvious  neuro-muscular anomalies detected. Sensory (Neurological): Dermatomal pain pattern Palpation: No palpable anomalies               Upper Extremity (UE) Exam      Side: Right upper extremity   Side: Left upper extremity  Skin & Extremity Inspection: Skin color, temperature, and hair growth are WNL. No peripheral edema or cyanosis. No masses, redness, swelling, asymmetry, or associated skin lesions. No contractures.   Skin & Extremity Inspection: Skin color, temperature, and hair growth are WNL. No peripheral edema or cyanosis. No masses, redness, swelling, asymmetry, or associated skin lesions. No contractures.  Functional ROM: Pain restricted ROM for shoulder   Functional ROM: Pain restricted ROM for shoulder  Muscle Tone/Strength: Functionally intact. No obvious neuro-muscular anomalies detected.   Muscle Tone/Strength: Functionally intact. No obvious neuro-muscular anomalies detected.  Sensory (Neurological):Arthropathic arthralgia and possibly dermatomal   Sensory (Neurological): Arthropathic arthralgia and possibly dermatomal          Palpation: No palpable anomalies               Palpation: No palpable anomalies              Provocative Test(s):  Phalen's test: deferred Tinel's test: deferred Apley's scratch test (touch opposite shoulder):  Action 1 (Across chest): Decreased ROM Action 2 (Overhead): Decreased ROM Action 3 (LB reach): Decreased ROM     Provocative Test(s):  Phalen's test: deferred Tinel's test: deferred Apley's scratch test (touch opposite shoulder):  Action 1 (Across chest): Decreased ROM Action 2 (Overhead): Decreased ROM Action 3 (LB reach): Decreased ROM  Lumbar Spine Area Exam  Skin & Axial Inspection: No masses, redness, or swelling Alignment: Symmetrical Functional ROM: Pain restricted ROM       Stability: No instability detected Muscle Tone/Strength: Functionally intact. No obvious neuro-muscular anomalies detected. Sensory (Neurological):  Neurogenic pain pattern   Gait & Posture Assessment  Ambulation: Unassisted Gait: Relatively normal for age and body habitus Posture: WNL    Lower Extremity Exam      Side: Right lower extremity   Side: Left lower extremity  Stability: No instability observed           Stability: No instability observed          Skin & Extremity Inspection: Skin color, temperature, and hair growth are WNL. No peripheral edema or cyanosis. No masses, redness, swelling, asymmetry, or associated skin lesions. No contractures.   Skin & Extremity Inspection: Skin color, temperature, and hair growth are WNL. No peripheral edema or cyanosis. No masses, redness, swelling, asymmetry, or associated skin lesions. No contractures.  Functional ROM: Pain restricted ROM for hip and knee joints           Functional ROM: Pain restricted ROM for hip and knee joints          Muscle Tone/Strength: Functionally intact. No obvious neuro-muscular anomalies detected.   Muscle Tone/Strength: Functionally intact. No obvious neuro-muscular anomalies detected.  Sensory (Neurological): Neurogenic pain pattern         Sensory (Neurological): Neurogenic pain pattern        DTR: Patellar: deferred today Achilles: deferred today Plantar: deferred today   DTR: Patellar: deferred today Achilles: deferred today Plantar: deferred today  Palpation: No palpable anomalies   Palpation: No palpable anomalies     Assessment   Diagnosis Status  1. Cervical radicular pain   2. Cervicalgia   3. Lumbar radicular pain   4. Spinal stenosis, lumbar region, with neurogenic claudication   5. Chronic radicular lumbar pain   6. Chronic pain syndrome    Persistent Persistent Persistent    Plan of Care   Requested Prescriptions   Signed Prescriptions Disp Refills   oxyCODONE-acetaminophen (PERCOCET) 10-325 MG tablet 90 tablet 0    Sig: Take 1 tablet by mouth every 8 (eight) hours as needed.   oxyCODONE-acetaminophen (PERCOCET) 10-325 MG  tablet 90 tablet 0    Sig: Take 1 tablet by mouth every 8 (eight) hours as needed.   oxyCODONE-acetaminophen (PERCOCET) 10-325 MG tablet 90 tablet 0    Sig: Take 1 tablet by mouth every 8 (eight) hours as needed.   Continue Lyrica 100 mg twice a day, no refills needed.  Orders:  No orders of the defined types were placed in this encounter.  Follow-up plan:   Return in about 3 months (around 11/19/2022) for Medication Management, in person.      Recent Visits Date Type Provider Dept  06/30/22 Office Visit Edward Jolly, MD [facility]  06/16/22 Procedure visit Edward Jolly, MD [facility]  05/25/22 Office Visit Edward Jolly, MD [facility]  Showing recent visits within past 90 days and meeting all other requirements Today's Visits Date Type Provider Dept  08/19/22 Office Visit Edward Jolly, MD [facility]  Showing today's visits and meeting all other requirements Future Appointments Date Type Provider Dept  11/16/22 Appointment Edward Jolly, MD [facility]  Showing future appointments within next 90 days and meeting all other requirements  I discussed the assessment and treatment plan with the patient. The patient was  provided an opportunity to ask questions and all were answered. The patient agreed with the plan and demonstrated an understanding of the instructions.  Patient advised to call back or seek an in-person evaluation if the symptoms or condition worsens.  Duration of encounter: .  Total time on encounter, as per AMA guidelines included both the face-to-face and non-face-to-face time personally spent by the physician and/or other qualified health care professional(s) on the day of the encounter (includes time in activities that require the physician or other qualified health care professional and does not include time in activities normally performed by clinical staff). Physician's time may include the  following activities when performed: Preparing to see the patient (e.g., pre-charting review of records, searching for previously ordered imaging, lab work, and nerve conduction tests) Review of prior analgesic pharmacotherapies. Reviewing PMP Interpreting ordered tests (e.g., lab work, imaging, nerve conduction tests) Performing post-procedure evaluations, including interpretation of diagnostic procedures Obtaining and/or reviewing separately obtained history Performing a medically appropriate examination and/or evaluation Counseling and educating the patient/family/caregiver Ordering medications, tests, or procedures Referring and communicating with other health care professionals (when not separately reported) Documenting clinical information in the electronic or other health record Independently interpreting results (not separately reported) and communicating results to the patient/ family/caregiver Care coordination (not separately reported)  Note by: Edward Jolly, MD Date: 08/19/2022; Time: 11:56 AM

## 2022-08-19 NOTE — Telephone Encounter (Signed)
Patty spoke with pt's PCP and we were able to get him an appt for 7/29 at 12:15.

## 2022-08-20 ENCOUNTER — Inpatient Hospital Stay: Payer: No Typology Code available for payment source

## 2022-08-20 ENCOUNTER — Other Ambulatory Visit: Payer: Self-pay

## 2022-08-20 ENCOUNTER — Encounter: Payer: Self-pay | Admitting: Oncology

## 2022-08-20 ENCOUNTER — Inpatient Hospital Stay
Admission: EM | Admit: 2022-08-20 | Discharge: 2022-08-24 | DRG: 375 | Disposition: A | Discharge status: Home / Self Care | Payer: No Typology Code available for payment source | Attending: Internal Medicine | Admitting: Internal Medicine

## 2022-08-20 VITALS — BP 145/72 | HR 63 | Temp 98.0°F

## 2022-08-20 DIAGNOSIS — G8929 Other chronic pain: Secondary | ICD-10-CM | POA: Diagnosis present

## 2022-08-20 DIAGNOSIS — N401 Enlarged prostate with lower urinary tract symptoms: Secondary | ICD-10-CM | POA: Diagnosis not present

## 2022-08-20 DIAGNOSIS — N1831 Chronic kidney disease, stage 3a: Secondary | ICD-10-CM | POA: Diagnosis present

## 2022-08-20 DIAGNOSIS — D124 Benign neoplasm of descending colon: Secondary | ICD-10-CM | POA: Diagnosis not present

## 2022-08-20 DIAGNOSIS — D464 Refractory anemia, unspecified: Secondary | ICD-10-CM | POA: Diagnosis not present

## 2022-08-20 DIAGNOSIS — I10 Essential (primary) hypertension: Secondary | ICD-10-CM | POA: Diagnosis not present

## 2022-08-20 DIAGNOSIS — K5730 Diverticulosis of large intestine without perforation or abscess without bleeding: Secondary | ICD-10-CM | POA: Diagnosis present

## 2022-08-20 DIAGNOSIS — C9110 Chronic lymphocytic leukemia of B-cell type not having achieved remission: Secondary | ICD-10-CM

## 2022-08-20 DIAGNOSIS — G4733 Obstructive sleep apnea (adult) (pediatric): Secondary | ICD-10-CM | POA: Diagnosis not present

## 2022-08-20 DIAGNOSIS — Z79899 Other long term (current) drug therapy: Secondary | ICD-10-CM

## 2022-08-20 DIAGNOSIS — K449 Diaphragmatic hernia without obstruction or gangrene: Secondary | ICD-10-CM | POA: Diagnosis present

## 2022-08-20 DIAGNOSIS — C182 Malignant neoplasm of ascending colon: Principal | ICD-10-CM | POA: Diagnosis present

## 2022-08-20 DIAGNOSIS — N138 Other obstructive and reflux uropathy: Secondary | ICD-10-CM | POA: Diagnosis not present

## 2022-08-20 DIAGNOSIS — D128 Benign neoplasm of rectum: Secondary | ICD-10-CM | POA: Diagnosis present

## 2022-08-20 DIAGNOSIS — D631 Anemia in chronic kidney disease: Secondary | ICD-10-CM | POA: Diagnosis present

## 2022-08-20 DIAGNOSIS — Z7984 Long term (current) use of oral hypoglycemic drugs: Secondary | ICD-10-CM

## 2022-08-20 DIAGNOSIS — I129 Hypertensive chronic kidney disease with stage 1 through stage 4 chronic kidney disease, or unspecified chronic kidney disease: Secondary | ICD-10-CM | POA: Diagnosis present

## 2022-08-20 DIAGNOSIS — K2970 Gastritis, unspecified, without bleeding: Secondary | ICD-10-CM | POA: Diagnosis present

## 2022-08-20 DIAGNOSIS — K921 Melena: Secondary | ICD-10-CM | POA: Diagnosis not present

## 2022-08-20 DIAGNOSIS — Z809 Family history of malignant neoplasm, unspecified: Secondary | ICD-10-CM

## 2022-08-20 DIAGNOSIS — D630 Anemia in neoplastic disease: Secondary | ICD-10-CM | POA: Diagnosis present

## 2022-08-20 DIAGNOSIS — Z79890 Hormone replacement therapy (postmenopausal): Secondary | ICD-10-CM

## 2022-08-20 DIAGNOSIS — E119 Type 2 diabetes mellitus without complications: Secondary | ICD-10-CM | POA: Diagnosis present

## 2022-08-20 DIAGNOSIS — E039 Hypothyroidism, unspecified: Secondary | ICD-10-CM | POA: Diagnosis present

## 2022-08-20 DIAGNOSIS — M5416 Radiculopathy, lumbar region: Secondary | ICD-10-CM | POA: Diagnosis present

## 2022-08-20 DIAGNOSIS — K649 Unspecified hemorrhoids: Secondary | ICD-10-CM | POA: Diagnosis not present

## 2022-08-20 DIAGNOSIS — E785 Hyperlipidemia, unspecified: Secondary | ICD-10-CM | POA: Diagnosis not present

## 2022-08-20 DIAGNOSIS — Z87891 Personal history of nicotine dependence: Secondary | ICD-10-CM

## 2022-08-20 DIAGNOSIS — D123 Benign neoplasm of transverse colon: Secondary | ICD-10-CM | POA: Diagnosis present

## 2022-08-20 DIAGNOSIS — D509 Iron deficiency anemia, unspecified: Secondary | ICD-10-CM | POA: Diagnosis present

## 2022-08-20 DIAGNOSIS — M19012 Primary osteoarthritis, left shoulder: Secondary | ICD-10-CM | POA: Diagnosis present

## 2022-08-20 DIAGNOSIS — D649 Anemia, unspecified: Secondary | ICD-10-CM | POA: Diagnosis not present

## 2022-08-20 DIAGNOSIS — K640 First degree hemorrhoids: Secondary | ICD-10-CM | POA: Diagnosis present

## 2022-08-20 DIAGNOSIS — K2950 Unspecified chronic gastritis without bleeding: Secondary | ICD-10-CM | POA: Diagnosis not present

## 2022-08-20 DIAGNOSIS — D122 Benign neoplasm of ascending colon: Secondary | ICD-10-CM | POA: Diagnosis not present

## 2022-08-20 DIAGNOSIS — E1122 Type 2 diabetes mellitus with diabetic chronic kidney disease: Secondary | ICD-10-CM | POA: Diagnosis present

## 2022-08-20 DIAGNOSIS — Z7982 Long term (current) use of aspirin: Secondary | ICD-10-CM

## 2022-08-20 DIAGNOSIS — K922 Gastrointestinal hemorrhage, unspecified: Secondary | ICD-10-CM | POA: Diagnosis not present

## 2022-08-20 DIAGNOSIS — E1129 Type 2 diabetes mellitus with other diabetic kidney complication: Secondary | ICD-10-CM | POA: Diagnosis present

## 2022-08-20 DIAGNOSIS — K635 Polyp of colon: Secondary | ICD-10-CM | POA: Diagnosis not present

## 2022-08-20 DIAGNOSIS — G894 Chronic pain syndrome: Secondary | ICD-10-CM | POA: Diagnosis present

## 2022-08-20 LAB — TYPE AND SCREEN
ABO/RH(D): O POS
Antibody Screen: POSITIVE
Unit division: 0
Unit division: 0
Unit division: 0

## 2022-08-20 LAB — COMPREHENSIVE METABOLIC PANEL
ALT: 23 U/L (ref 0–44)
AST: 61 U/L — ABNORMAL HIGH (ref 15–41)
Albumin: 3.4 g/dL — ABNORMAL LOW (ref 3.5–5.0)
Alkaline Phosphatase: 44 U/L (ref 38–126)
Anion gap: 8 (ref 5–15)
BUN: 16 mg/dL (ref 8–23)
CO2: 24 mmol/L (ref 22–32)
Calcium: 8 mg/dL — ABNORMAL LOW (ref 8.9–10.3)
Chloride: 107 mmol/L (ref 98–111)
Creatinine, Ser: 1.35 mg/dL — ABNORMAL HIGH (ref 0.61–1.24)
GFR, Estimated: 54 mL/min — ABNORMAL LOW (ref >60)
Glucose, Bld: 115 mg/dL — ABNORMAL HIGH (ref 70–99)
Potassium: 4.1 mmol/L (ref 3.5–5.1)
Sodium: 139 mmol/L (ref 135–145)
Total Bilirubin: 1 mg/dL (ref 0.3–1.2)
Total Protein: 6.1 g/dL — ABNORMAL LOW (ref 6.5–8.1)

## 2022-08-20 LAB — HEMOGLOBIN AND HEMATOCRIT, BLOOD
HCT: 18.6 % — ABNORMAL LOW (ref 39.0–52.0)
Hemoglobin: 5.7 g/dL — ABNORMAL LOW (ref 13.0–17.0)

## 2022-08-20 LAB — CBC WITH DIFFERENTIAL (CANCER CENTER ONLY)
Abs Immature Granulocytes: 0.02 10*3/uL (ref 0.00–0.07)
Basophils Absolute: 0.1 10*3/uL (ref 0.0–0.1)
Basophils Relative: 1 %
Eosinophils Absolute: 0.2 10*3/uL (ref 0.0–0.5)
Eosinophils Relative: 2 %
HCT: 20.9 % — ABNORMAL LOW (ref 39.0–52.0)
Hemoglobin: 6.4 g/dL — CL (ref 13.0–17.0)
Immature Granulocytes: 0 %
Lymphocytes Relative: 55 %
Lymphs Abs: 3.9 10*3/uL (ref 0.7–4.0)
MCH: 27.9 pg (ref 26.0–34.0)
MCHC: 30.6 g/dL (ref 30.0–36.0)
MCV: 91.3 fL (ref 80.0–100.0)
Monocytes Absolute: 0.4 10*3/uL (ref 0.1–1.0)
Monocytes Relative: 6 %
Neutro Abs: 2.5 10*3/uL (ref 1.7–7.7)
Neutrophils Relative %: 36 %
Platelet Count: 150 10*3/uL (ref 150–400)
RBC: 2.29 MIL/uL — ABNORMAL LOW (ref 4.22–5.81)
RDW: 18.5 % — ABNORMAL HIGH (ref 11.5–15.5)
WBC Count: 7 10*3/uL (ref 4.0–10.5)
nRBC: 0 % (ref 0.0–0.2)

## 2022-08-20 LAB — FOLATE: Folate: 24 ng/mL (ref >5.9)

## 2022-08-20 LAB — RETIC PANEL
Immature Retic Fract: 17.2 % — ABNORMAL HIGH (ref 2.3–15.9)
RBC.: 2.31 MIL/uL — ABNORMAL LOW (ref 4.22–5.81)
Retic Count, Absolute: 49.7 10*3/uL (ref 19.0–186.0)
Retic Ct Pct: 2.2 % (ref 0.4–3.1)
Reticulocyte Hemoglobin: 22.4 pg — ABNORMAL LOW (ref >27.9)

## 2022-08-20 LAB — BPAM RBC
Blood Product Expiration Date: 202409022359
Blood Product Expiration Date: 202409022359
Blood Product Expiration Date: 202409022359
ISSUE DATE / TIME: 202407262217
Unit Type and Rh: 5100
Unit Type and Rh: 5100
Unit Type and Rh: 5100

## 2022-08-20 LAB — CBC
HCT: 19.9 % — ABNORMAL LOW (ref 39.0–52.0)
Hemoglobin: 6.1 g/dL — ABNORMAL LOW (ref 13.0–17.0)
MCH: 27.5 pg (ref 26.0–34.0)
MCHC: 30.7 g/dL (ref 30.0–36.0)
MCV: 89.6 fL (ref 80.0–100.0)
Platelets: 155 10*3/uL (ref 150–400)
RBC: 2.22 MIL/uL — ABNORMAL LOW (ref 4.22–5.81)
RDW: 18.6 % — ABNORMAL HIGH (ref 11.5–15.5)
WBC: 6.8 10*3/uL (ref 4.0–10.5)
nRBC: 0 % (ref 0.0–0.2)

## 2022-08-20 LAB — IRON AND TIBC
Iron: 132 ug/dL (ref 45–182)
Saturation Ratios: 45 % — ABNORMAL HIGH (ref 17.9–39.5)
TIBC: 295 ug/dL (ref 250–450)
UIBC: 163 ug/dL

## 2022-08-20 LAB — PREPARE RBC (CROSSMATCH)

## 2022-08-20 LAB — RETICULOCYTES
Immature Retic Fract: 25.3 % — ABNORMAL HIGH (ref 2.3–15.9)
RBC.: 2.22 MIL/uL — ABNORMAL LOW (ref 4.22–5.81)
Retic Count, Absolute: 51.5 10*3/uL (ref 19.0–186.0)
Retic Ct Pct: 2.3 % (ref 0.4–3.1)

## 2022-08-20 LAB — GLUCOSE, CAPILLARY: Glucose-Capillary: 118 mg/dL — ABNORMAL HIGH (ref 70–99)

## 2022-08-20 LAB — FERRITIN: Ferritin: 12 ng/mL — ABNORMAL LOW (ref 24–336)

## 2022-08-20 MED ORDER — SODIUM CHLORIDE 0.9% IV SOLUTION: Freq: Once | INTRAVENOUS | Status: DC

## 2022-08-20 MED ORDER — PANTOPRAZOLE 80MG IVPB - SIMPLE MED
80.0000 mg | Freq: Two times a day (BID) | INTRAVENOUS | Status: DC
  Administered 2022-08-20: 80 mg via INTRAVENOUS
  Filled 2022-08-20: qty 100

## 2022-08-20 MED ORDER — OXYCODONE-ACETAMINOPHEN 5-325 MG PO TABS
1.0000 | ORAL_TABLET | Freq: Three times a day (TID) | ORAL | Status: DC | PRN
  Administered 2022-08-21: 1 via ORAL
  Filled 2022-08-20: qty 1

## 2022-08-20 MED ORDER — PANTOPRAZOLE SODIUM 40 MG IV SOLR: 40.0000 mg | Freq: Two times a day (BID) | INTRAVENOUS | Status: DC

## 2022-08-20 MED ORDER — PREGABALIN 50 MG PO CAPS
100.0000 mg | ORAL_CAPSULE | Freq: Two times a day (BID) | ORAL | Status: DC
  Administered 2022-08-20 – 2022-08-24 (×7): 100 mg via ORAL
  Filled 2022-08-20 (×7): qty 2

## 2022-08-20 MED ORDER — LEVOTHYROXINE SODIUM 100 MCG PO TABS
200.0000 ug | ORAL_TABLET | Freq: Every day | ORAL | Status: DC
  Administered 2022-08-21 – 2022-08-24 (×2): 200 ug via ORAL
  Filled 2022-08-20 (×2): qty 2

## 2022-08-20 MED ORDER — METOPROLOL SUCCINATE ER 25 MG PO TB24
25.0000 mg | ORAL_TABLET | Freq: Every day | ORAL | Status: DC
  Administered 2022-08-22 – 2022-08-24 (×2): 25 mg via ORAL
  Filled 2022-08-20 (×2): qty 1

## 2022-08-20 MED ORDER — IRBESARTAN 150 MG PO TABS
150.0000 mg | ORAL_TABLET | Freq: Every day | ORAL | Status: DC
  Administered 2022-08-21 – 2022-08-24 (×4): 150 mg via ORAL
  Filled 2022-08-20 (×4): qty 1

## 2022-08-20 MED ORDER — SODIUM CHLORIDE 0.9 % IV SOLN
200.0000 mg | Freq: Once | INTRAVENOUS | Status: AC
  Administered 2022-08-20: 200 mg via INTRAVENOUS
  Filled 2022-08-20: qty 200

## 2022-08-20 MED ORDER — MELATONIN 5 MG PO TABS
5.0000 mg | ORAL_TABLET | Freq: Every evening | ORAL | Status: DC | PRN
  Administered 2022-08-20 – 2022-08-23 (×2): 5 mg via ORAL
  Filled 2022-08-20 (×2): qty 1

## 2022-08-20 MED ORDER — INSULIN ASPART 100 UNIT/ML IJ SOLN: 0.0000 [IU] | Freq: Three times a day (TID) | INTRAMUSCULAR | Status: DC

## 2022-08-20 MED ORDER — ACETAMINOPHEN 650 MG RE SUPP: 650.0000 mg | Freq: Four times a day (QID) | RECTAL | Status: DC | PRN

## 2022-08-20 MED ORDER — SODIUM CHLORIDE 0.9 % IV SOLN
Freq: Once | INTRAVENOUS | Status: AC
  Filled 2022-08-20: qty 250

## 2022-08-20 MED ORDER — HYDRALAZINE HCL 50 MG PO TABS
50.0000 mg | ORAL_TABLET | Freq: Two times a day (BID) | ORAL | Status: DC
  Administered 2022-08-20 – 2022-08-24 (×7): 50 mg via ORAL
  Filled 2022-08-20 (×7): qty 1

## 2022-08-20 MED ORDER — ONDANSETRON HCL 4 MG PO TABS: 4.0000 mg | ORAL_TABLET | Freq: Four times a day (QID) | ORAL | Status: DC | PRN

## 2022-08-20 MED ORDER — SENNOSIDES-DOCUSATE SODIUM 8.6-50 MG PO TABS: 1.0000 | ORAL_TABLET | Freq: Every evening | ORAL | Status: DC | PRN

## 2022-08-20 MED ORDER — HYDRALAZINE HCL 20 MG/ML IJ SOLN
5.0000 mg | Freq: Three times a day (TID) | INTRAMUSCULAR | Status: DC | PRN
  Administered 2022-08-23: 5 mg via INTRAVENOUS
  Filled 2022-08-20: qty 1

## 2022-08-20 MED ORDER — OXYCODONE HCL 5 MG PO TABS
5.0000 mg | ORAL_TABLET | Freq: Three times a day (TID) | ORAL | Status: DC | PRN
  Administered 2022-08-21: 5 mg via ORAL
  Filled 2022-08-20: qty 1

## 2022-08-20 MED ORDER — ONDANSETRON HCL 4 MG/2ML IJ SOLN
4.0000 mg | Freq: Four times a day (QID) | INTRAMUSCULAR | Status: DC | PRN
  Administered 2022-08-21 (×2): 4 mg via INTRAVENOUS
  Filled 2022-08-20 (×2): qty 2

## 2022-08-20 MED ORDER — ACETAMINOPHEN 325 MG PO TABS: 650.0000 mg | ORAL_TABLET | Freq: Four times a day (QID) | ORAL | Status: DC | PRN

## 2022-08-20 MED ORDER — ROSUVASTATIN CALCIUM 10 MG PO TABS
40.0000 mg | ORAL_TABLET | Freq: Every day | ORAL | Status: DC
  Administered 2022-08-22 – 2022-08-23 (×2): 40 mg via ORAL
  Filled 2022-08-20 (×2): qty 4

## 2022-08-20 MED ORDER — INSULIN ASPART 100 UNIT/ML IJ SOLN: 0.0000 [IU] | Freq: Every day | INTRAMUSCULAR | Status: DC

## 2022-08-20 MED ORDER — SODIUM CHLORIDE 0.9 % IV SOLN: 10.0000 mL/h | Freq: Once | INTRAVENOUS | Status: AC

## 2022-08-20 NOTE — H&P (Signed)
History and Physical   Cristina Hensey [name] WUJ:[id number] DOB: 04/10/1945 DOA: 08/20/2022  PCP: Wilford Corner, PA-C  Outpatient Specialists: Dr. Edward Jolly, pain management Patient coming from: Cancer Center outpatient  I have personally briefly reviewed patient's old medical records in [facility] EMR.  Chief Concern: Low hemoglobin on lab  HPI: [name], [name] is a 77 year old male with history of CLL, iron deficiency anemia, CKD 3A, hypertension, chronic low back pain, hyperlipidemia, non-insulin-dependent diabetes mellitus, who presents emergency department from cancer center for chief concerns of low hemoglobin at 6.4.  Vitals in the ED showed temperature of 97.9, respiration rate of 14, heart rate of 57, blood pressure 156/77, SpO2 of 100% on room air.  Serum sodium is 139, potassium 4.1, chloride 107, bicarb 24, BUN of 16, serum creatinine of 1.35, EGFR 54, nonfasting blood glucose 115, WBC 6.8, hemoglobin 6.1, platelets of 155.  Per EDP performed a rectal exam and it was negative.  ED treatment: Patient is typed and screened, prepare 1 unit of PRBC for transfusion. ----------------------------- At bedside, patient was able to tell me his name, age, location, current calendar year.  He was watching TV while in the emergency bed.  He reports that yesterday he felt a little weak however today she felt better.  He was scheduled for iron transfusion on Friday, 08/13/2022.  However his appointment was canceled due to the nationwide [employer] outage.  His daughter is concerned that the reason why his hemoglobin was so low today, is because he missed the iron transfusion last week.  He came in today to outpatient cancer center and received his IV transfusion on 7/26 prior to ED presentation.  He was ultimately sent to the emergency department as he had regular labs done and showed hemoglobin low at 6.4.  He denies chest pain, shortness of breath, fever, chills,  dysuria, hematuria.  He states he had 1 bowel movement yesterday and it was black.  Social history: He lives at home with his wife, he denies tobacco, EtOH, recreational drug use.  He is retired and formally worked in the concrete business.  ROS: Constitutional: no weight change, no fever ENT/Mouth: no sore throat, no rhinorrhea Eyes: no eye pain, no vision changes Cardiovascular: no chest pain, no dyspnea,  no edema, no palpitations Respiratory: no cough, no sputum, no wheezing Gastrointestinal: no nausea, no vomiting, no diarrhea, no constipation Genitourinary: no urinary incontinence, no dysuria, no hematuria Musculoskeletal: no arthralgias, no myalgias Skin: no skin lesions, no pruritus, Neuro: + weakness, no loss of consciousness, no syncope Psych: no anxiety, no depression, no decrease appetite Heme/Lymph: no bruising, no bleeding  ED Course: Discussed with emergency medicine provider, patient requiring hospitalization for chief concerns of acute on chronic anemia requiring blood transfusion.  Assessment/Plan  Principal Problem:   Acute on chronic anemia Active Problems:   Essential hypertension   Type II diabetes mellitus with renal manifestations (HCC)   BPH with obstruction/lower urinary tract symptoms   HLD (hyperlipidemia)   Hypothyroidism   Chronic back pain   OSA on CPAP   CLL (chronic lymphocytic leukemia) (HCC)   IDA (iron deficiency anemia)   Assessment and Plan:  * Acute on chronic anemia Asymptomatic anemia Etiology workup in progress, differentials include upper GI bleed, progressive CLL versus anemia of chronic kidney disease versus worsening iron deficiency anemia Anemia panel ordered on admission Patient is status post type and screen, prepare 1 unit for PRBC for transfusion ordered by EDP Patient's baseline hemoglobin is  7.4-8.4 over the last 2 months Check CBC in the a.m. Inpatient gastroenterologist has been consulted to Dr. Timothy Lasso who is aware of  the patient Goal hemoglobin greater than 7 for any procedure Clear liquid diet; n.p.o. after midnight for possible endoscopy Protonix 80 mg IV twice daily ordered Nursing instructions: two PIV (prefer large bore) placed Admit to telemetry cardiac, inpatient  Essential hypertension Irbesartan 150 milligrams daily, metoprolol succinate 25 mg daily, hydralazine 50 mg p.o. twice daily were resumed Hydralazine 5 mg IV every 8 hours as needed for SBP greater 175  Type II diabetes mellitus with renal manifestations (HCC) Non-insulin-dependent diabetes mellitus Home metformin not resumed on admission Insulin SSI with at bedtime coverage ordered  HLD (hyperlipidemia) Rosuvastatin 40 mg nightly resumed  Hypothyroidism Levothyroxine 200 mcg daily before breakfast resumed  IDA (iron deficiency anemia) Anemia panel ordered  CLL (chronic lymphocytic leukemia) (HCC) Holding home Zanubrutinib while inpatient due to adverse effects including anemia and decreased hemoglobin Continue outpatient follow-up with medical oncologist as appropriate  OSA on CPAP CPAP nightly ordered  Chronic back pain Home Percocet 10 mg nightly as needed for moderate and severe pain resumed  Chart reviewed.   DVT prophylaxis: TED hose; AM team to initiate pharmacologic DVT prophylaxis when the benefits outweigh the risk Code Status: Full code Diet: Clear liquid diet; n.p.o. after midnight Family Communication: Updated with Ms. Osmel Kravchuk, daughter per patient's request Disposition Plan: Pending clinical course Consults called: Gastroenterology service Admission status: Telemetry cardiac, inpatient  Past Medical History:  Diagnosis Date   Acute prostatitis    BPH (benign prostatic hyperplasia)    Chronic kidney disease    CLL (chronic lymphocytic leukemia) (HCC)    Diabetes (HCC)    Dysplasia of prostate    Erectile dysfunction    HTN (hypertension)    Hypogonadism in male    Orchitis and  epididymitis    Over weight    Palindromic rheumatism, hand    Prostatitis    Rectum pain    Testicle swelling    Testicle tenderness    Testicular mass    Past Surgical History:  Procedure Laterality Date   BACK SURGERY     head surgery     from fall  drilled hole in brain to relieve pressure   TONSILLECTOMY     Social History:  reports that he has quit smoking. He has never used smokeless tobacco. He reports that he does not drink alcohol and does not use drugs.  No Known Allergies Family History  Problem Relation Age of Onset   Cancer Maternal Aunt    Prostate cancer Neg Hx    Kidney disease Neg Hx    Kidney cancer Neg Hx    Bladder Cancer Neg Hx    Family history: Family history reviewed and not pertinent.  Prior to Admission medications   Medication Sig Start Date End Date Taking? Authorizing Provider  aspirin EC 81 MG tablet Take 1 tablet (81 mg total) by mouth daily. Swallow whole. 08/09/22  Yes Furth, Cadence H, PA-C  dapagliflozin propanediol (FARXIGA) 10 MG TABS tablet Take 1 tablet (10 mg total) by mouth daily before breakfast. 08/09/22  Yes Furth, Cadence H, PA-C  hydrALAZINE (APRESOLINE) 50 MG tablet Take 1 tablet (50 mg total) by mouth in the morning and at bedtime. 07/28/22 10/26/22 Yes Agbor-Etang, Arlys John, MD  levothyroxine (SYNTHROID) 200 MCG tablet Take 200 mcg by mouth daily. 01/22/22  Yes [provider]  metFORMIN (GLUCOPHAGE) 500 MG tablet Take 500  mg by mouth 2 (two) times daily with a meal.   Yes [provider]  metoprolol succinate (TOPROL XL) 25 MG 24 hr tablet Take 1 tablet (25 mg total) by mouth daily. 08/09/22  Yes Furth, Cadence H, PA-C  oxyCODONE-acetaminophen (PERCOCET) 10-325 MG tablet Take 1 tablet by mouth every 8 (eight) hours as needed. 10/22/22 11/21/22 Yes Edward Jolly, MD  pregabalin (LYRICA) 100 MG capsule Take 1 capsule (100 mg total) by mouth 2 (two) times daily. 05/25/22 11/21/22 Yes Edward Jolly, MD  rosuvastatin  (CRESTOR) 40 MG tablet Take 1 tablet (40 mg total) by mouth daily. 08/09/22 11/07/22 Yes Furth, Cadence H, PA-C  trospium (SANCTURA) 20 MG tablet Take 1 tablet (20 mg total) by mouth 2 (two) times daily. 03/30/22  Yes McGowan, Carollee Herter A, PA-C  valsartan (DIOVAN) 160 MG tablet Take 160 mg by mouth daily. 08/18/22  Yes [provider]  zanubrutinib (BRUKINSA) 80 MG capsule Take 2 capsules (160 mg total) by mouth 2 (two) times daily. 08/06/22  Yes [name], MD  metoprolol tartrate (LOPRESSOR) 100 MG tablet Take 1 tablet (100 mg total) by mouth once for 1 dose. 2 hours prior to test Patient not taking: Reported on 08/20/2022 08/09/22 08/09/22  Furth, Cadence H, PA-C  oxyCODONE-acetaminophen (PERCOCET) 10-325 MG tablet Take 1 tablet by mouth every 8 (eight) hours as needed. 08/23/22 09/22/22  Edward Jolly, MD  oxyCODONE-acetaminophen (PERCOCET) 10-325 MG tablet Take 1 tablet by mouth every 8 (eight) hours as needed. 09/22/22 10/22/22  Edward Jolly, MD   Physical Exam: Vitals:   08/20/22 1348 08/20/22 1350 08/20/22 1352  BP:   (!) 159/79  Pulse:  (!) 58   Resp:  16   Temp:   97.9 F (36.6 C)  TempSrc:   Oral  SpO2:  98%   Weight: 107.5 kg    Height: 6\' 1"  (1.854 m)     Constitutional: appears age appropriate, NAD, calm Eyes: PERRL, lids and conjunctivae normal ENMT: Mucous membranes are moist. Posterior pharynx clear of any exudate or lesions. Age-appropriate dentition. Hearing appropriate Neck: normal, supple, no masses, no thyromegaly Respiratory: clear to auscultation bilaterally, no wheezing, no crackles. Normal respiratory effort. No accessory muscle use.  Cardiovascular: Regular rate and rhythm, no murmurs / rubs / gallops. No extremity edema. 2+ pedal pulses. No carotid bruits.  Abdomen: no tenderness, no masses palpated, no hepatosplenomegaly. Bowel sounds positive.  Musculoskeletal: no clubbing / cyanosis. No joint deformity upper and lower extremities. Good ROM, no contractures, no  atrophy. Normal muscle tone.  Skin: no rashes, lesions, ulcers. No induration Neurologic: Sensation intact. Strength 5/5 in all 4.  Psychiatric: Normal judgment and insight. Alert and oriented x 3. Normal mood.   EKG: ordered  Chest x-ray on Admission: Not indicated at this time  Labs on Admission: I have personally reviewed following labs  CBC: Recent Labs  Lab 08/20/22 1231 08/20/22 1350  WBC 7.0 6.8  NEUTROABS 2.5  --   HGB 6.4* 6.1*  HCT 20.9* 19.9*  MCV 91.3 89.6  PLT 150 155   Basic Metabolic Panel: Recent Labs  Lab 08/20/22 1350  NA 139  K 4.1  CL 107  CO2 24  GLUCOSE 115*  BUN 16  CREATININE 1.35*  CALCIUM 8.0*   GFR: Estimated Creatinine Clearance: 59.9 mL/min (A) (by C-G formula based on SCr of 1.35 mg/dL (H)).  Liver Function Tests: Recent Labs  Lab 08/20/22 1350  AST 61*  ALT 23  ALKPHOS 44  BILITOT 1.0  PROT 6.1*  ALBUMIN 3.4*   Anemia Panel: Recent Labs    08/20/22 1231 08/20/22 1350  RETICCTPCT 2.2 2.3   Urine analysis:    Component Value Date/Time   COLORURINE YELLOW (A) 12/26/2021 1542   APPEARANCEUR HAZY (A) 12/26/2021 1542   APPEARANCEUR Clear 10/04/2019 1128   LABSPEC 1.031 (H) 12/26/2021 1542   LABSPEC 1.019 06/16/2013 1607   PHURINE 5.0 12/26/2021 1542   GLUCOSEU >=500 (A) 12/26/2021 1542   GLUCOSEU Negative 06/16/2013 1607   HGBUR NEGATIVE 12/26/2021 1542   BILIRUBINUR NEGATIVE 12/26/2021 1542   BILIRUBINUR Negative 07/24/2020 1013   BILIRUBINUR Negative 10/04/2019 1128   BILIRUBINUR Negative 06/16/2013 1607   KETONESUR 5 (A) 12/26/2021 1542   PROTEINUR 100 (A) 12/26/2021 1542   UROBILINOGEN 0.2 07/24/2020 1013   NITRITE NEGATIVE 12/26/2021 1542   LEUKOCYTESUR TRACE (A) 12/26/2021 1542   LEUKOCYTESUR Negative 06/16/2013 1607   This document was prepared using Dragon Voice Recognition software and may include unintentional dictation errors.  Dr. Sedalia Muta [facility]  If 7PM-7AM, please contact  overnight-coverage provider If 7AM-7PM, please contact day attending provider [email or web address]  08/20/2022, 5:24 PM

## 2022-08-20 NOTE — Assessment & Plan Note (Signed)
Continue statin 

## 2022-08-20 NOTE — Assessment & Plan Note (Addendum)
Irbesartan 150 milligrams daily, metoprolol succinate 25 mg daily, hydralazine 50 mg p.o. twice daily were resumed Hydralazine 5 mg IV every 8 hours as needed for SBP greater 175

## 2022-08-20 NOTE — ED Notes (Signed)
First Nurse Note: Patient was brought from [facility] with a Hgb of 6.4. Dr. Cathie Hoops would like him to have one unit of RBCs. Patient was given [facility] manager today

## 2022-08-20 NOTE — Assessment & Plan Note (Signed)
Anemia panel ordered.

## 2022-08-20 NOTE — Assessment & Plan Note (Addendum)
Non-insulin-dependent diabetes mellitus Home metformin not resumed on admission Insulin SSI with at bedtime coverage ordered

## 2022-08-20 NOTE — ED Notes (Signed)
At bedside during rectal exam with MD Mumma. Patient tolerated well

## 2022-08-20 NOTE — Assessment & Plan Note (Signed)
-   Levothyroxine 200 mcg daily before breakfast resumed

## 2022-08-20 NOTE — Assessment & Plan Note (Addendum)
Holding home Zanubrutinib while inpatient due to adverse effects including anemia and decreased hemoglobin Continue outpatient follow-up with medical oncologist as appropriate

## 2022-08-20 NOTE — Assessment & Plan Note (Addendum)
Asymptomatic anemia Etiology workup in progress, differentials include upper GI bleed, progressive CLL versus anemia of chronic kidney disease versus worsening iron deficiency anemia Anemia panel ordered on admission Patient is status post type and screen, prepare 1 unit for PRBC for transfusion ordered by EDP Patient's baseline hemoglobin is 7.4-8.4 over the last 2 months Check CBC in the a.m. Inpatient gastroenterologist has been consulted to Dr. Timothy Lasso who is aware of the patient Goal hemoglobin greater than 7 for any procedure Clear liquid diet; n.p.o. after midnight for possible endoscopy Protonix 80 mg IV twice daily ordered Nursing instructions: two PIV (prefer large bore) placed Admit to telemetry cardiac, inpatient

## 2022-08-20 NOTE — ED Provider Notes (Signed)
[facility] Provider Note    Event Date/Time   First MD Initiated Contact with Patient 08/20/22 1353     (approximate)   History   Anemia   HPI  Paul Sowerby [name] is a 77 y.o. male past medical history significant for chronic back pain, CLL, hypertension, CKD, who presents to the emergency department for anemia and blood transfusion.  Patient states that he went for follow-up lab work and was told to come to the emergency department to get blood.  Patient has had blood transfusions in the past but does not know when the last blood transfusion was.  Followed by Dr. Cathie Hoops with oncology for his CLL.  Uncertain if he has had any blood in his stool.  Does take aspirin.  States that recently he is taking NSAIDs for his chronic back pain.  Does not recall his last colonoscopy or endoscopy.  Denies any chest pain does endorse some mild shortness of breath.       Physical Exam   Triage Vital Signs: ED Triage Vitals  Encounter Vitals Group     BP 08/20/22 1352 (!) 159/79     Systolic BP Percentile --      Diastolic BP Percentile --      Pulse Rate 08/20/22 1350 (!) 58     Resp 08/20/22 1350 16     Temp 08/20/22 1352 97.9 F (36.6 C)     Temp Source 08/20/22 1352 Oral     SpO2 08/20/22 1350 98 %     Weight 08/20/22 1348 236 lb 15.9 oz (107.5 kg)     Height 08/20/22 1348 6\' 1"  (1.854 m)     Head Circumference --      Peak Flow --      Pain Score 08/20/22 1348 0     Pain Loc --      Pain Education --      Exclude from Growth Chart --     Most recent vital signs: Vitals:   08/20/22 1350 08/20/22 1352  BP:  (!) 159/79  Pulse: (!) 58   Resp: 16   Temp:  97.9 F (36.6 C)  SpO2: 98%     Physical Exam Constitutional:      Appearance: He is well-developed.  HENT:     Head: Atraumatic.  Eyes:     Conjunctiva/sclera: Conjunctivae normal.  Cardiovascular:     Rate and Rhythm: Regular rhythm.  Pulmonary:     Effort: No respiratory distress.   Genitourinary:    Comments: Rectal exam no gross blood or melena Musculoskeletal:     Cervical back: Normal range of motion.  Skin:    General: Skin is warm.     Capillary Refill: Capillary refill takes less than 2 seconds.  Neurological:     Mental Status: He is alert. Mental status is at baseline.     IMPRESSION / MDM / ASSESSMENT AND PLAN / ED COURSE  I reviewed the triage vital signs and the nursing notes.  Differential diagnosis including anemia secondary to CLL, iron deficiency anemia, GI bleed, anemia of chronic disease  EKG  I, Corena Herter, the attending physician, personally viewed and interpreted this ECG.  Normal sinus rhythm.  First-degree heart block.  ST depression to the inferior lateral leads.  No significant ST elevation.  Change when compared to prior EKG.  No tachycardic or bradycardic dysrhythmias while on cardiac telemetry.  LABS (all labs ordered are listed, but only abnormal results are displayed)  Labs interpreted as -    Labs Reviewed  CBC - Abnormal; Notable for the following components:      Result Value   RBC 2.22 (*)    Hemoglobin 6.1 (*)    HCT 19.9 (*)    RDW 18.6 (*)    All other components within normal limits  COMPREHENSIVE METABOLIC PANEL - Abnormal; Notable for the following components:   Glucose, Bld 115 (*)    Creatinine, Ser 1.35 (*)    Calcium 8.0 (*)    Total Protein 6.1 (*)    Albumin 3.4 (*)    AST 61 (*)    GFR, Estimated 54 (*)    All other components within normal limits  TYPE AND SCREEN  TYPE AND SCREEN  PREPARE RBC (CROSSMATCH)  TYPE AND SCREEN     MDM    Patient was typed and screened.  Hemoglobin today is 6.1 down from 6.4, appears to have a baseline around 8.  Normocytic.  Platelets within normal limits.  Creatinine at baseline with no significant electrolyte abnormalities.  On chart review last visit with Dr. Cathie Hoops felt anemia may be multifactorial secondary to CLL, chronic disease or possible GI loss and  was given a referral to gastroenterology.  Given his chronic anemia requiring blood transfusion will consult hospitalist for admission and further workup.   PROCEDURES:  Critical Care performed: yes  .Critical Care  Performed by: Corena Herter, MD Authorized by: Corena Herter, MD   Critical care provider statement:    Critical care time (minutes):  45   Critical care time was exclusive of:  Separately billable procedures and treating other patients   Critical care was necessary to treat or prevent imminent or life-threatening deterioration of the following conditions:  Circulatory failure (blood transfusion)   Critical care was time spent personally by me on the following activities:  Development of treatment plan with patient or surrogate, discussions with consultants, evaluation of patient's response to treatment, examination of patient, ordering and review of laboratory studies, ordering and review of radiographic studies, ordering and performing treatments and interventions, pulse oximetry, re-evaluation of patient's condition and review of old charts   Patient's presentation is most consistent with acute presentation with potential threat to life or bodily function.   MEDICATIONS ORDERED IN ED: Medications  0.9 %  sodium chloride infusion (has no administration in time range)    FINAL CLINICAL IMPRESSION(S) / ED DIAGNOSES   Final diagnoses:  Anemia, unspecified type     Rx / DC Orders   ED Discharge Orders     None        Note:  This document was prepared using Dragon voice recognition software and may include unintentional dictation errors.   Corena Herter, MD 08/20/22 [phone]

## 2022-08-20 NOTE — Progress Notes (Signed)
Hemoglobin 6.4. Pt denies any complaints at this time. Per Dr. Cathie Hoops pt to be taken to ED for blood transfusion, no need to wait 30 minute post Venofer observation and okay to leave PIV in place. Roxie RN notified ED. Rt 22 gauge PIV remains in place and [place] RN aware. Pt in stable condition at time of transfer.

## 2022-08-20 NOTE — Assessment & Plan Note (Signed)
Home Percocet 10 mg nightly as needed for moderate and severe pain resumed

## 2022-08-20 NOTE — Assessment & Plan Note (Addendum)
-  CPAP nightly ordered 

## 2022-08-20 NOTE — Hospital Course (Signed)
[name], [name] is a 77 year old male with history of CLL, iron deficiency anemia, CKD 3A, hypertension, chronic low back pain, hyperlipidemia, non-insulin-dependent diabetes mellitus, who presents emergency department from cancer center for chief concerns of low hemoglobin at 6.4.  Vitals in the ED showed temperature of 97.9, respiration rate of 14, heart rate of 57, blood pressure 156/77, SpO2 of 100% on room air.  Serum sodium is 139, potassium 4.1, chloride 107, bicarb 24, BUN of 16, serum creatinine of 1.35, EGFR 54, nonfasting blood glucose 115, WBC 6.8, hemoglobin 6.1, platelets of 155.  Per EDP performed a rectal exam and it was negative.  ED treatment: Patient is typed and screened, prepare 1 unit of PRBC for transfusion.

## 2022-08-20 NOTE — Plan of Care (Signed)
Educating patient. He is receptive

## 2022-08-20 NOTE — ED Triage Notes (Signed)
Pt here with low hgb, 6.5. Pt here to receive blood. Pt states he was weak yesterday but feels fine today.

## 2022-08-21 ENCOUNTER — Inpatient Hospital Stay: Payer: No Typology Code available for payment source | Admitting: Certified Registered Nurse Anesthetist

## 2022-08-21 ENCOUNTER — Encounter: Payer: Self-pay | Admitting: Internal Medicine

## 2022-08-21 ENCOUNTER — Encounter
Admission: EM | Disposition: A | Discharge status: Home / Self Care | Payer: Self-pay | Source: Home / Self Care | Attending: Internal Medicine

## 2022-08-21 DIAGNOSIS — Z87891 Personal history of nicotine dependence: Secondary | ICD-10-CM | POA: Diagnosis not present

## 2022-08-21 DIAGNOSIS — N1831 Chronic kidney disease, stage 3a: Secondary | ICD-10-CM | POA: Diagnosis not present

## 2022-08-21 DIAGNOSIS — K2970 Gastritis, unspecified, without bleeding: Secondary | ICD-10-CM | POA: Diagnosis not present

## 2022-08-21 DIAGNOSIS — D509 Iron deficiency anemia, unspecified: Secondary | ICD-10-CM | POA: Diagnosis not present

## 2022-08-21 DIAGNOSIS — K921 Melena: Secondary | ICD-10-CM | POA: Diagnosis not present

## 2022-08-21 DIAGNOSIS — K449 Diaphragmatic hernia without obstruction or gangrene: Secondary | ICD-10-CM | POA: Diagnosis not present

## 2022-08-21 DIAGNOSIS — I129 Hypertensive chronic kidney disease with stage 1 through stage 4 chronic kidney disease, or unspecified chronic kidney disease: Secondary | ICD-10-CM | POA: Diagnosis not present

## 2022-08-21 DIAGNOSIS — K2950 Unspecified chronic gastritis without bleeding: Secondary | ICD-10-CM | POA: Diagnosis not present

## 2022-08-21 DIAGNOSIS — D649 Anemia, unspecified: Secondary | ICD-10-CM | POA: Diagnosis not present

## 2022-08-21 DIAGNOSIS — K922 Gastrointestinal hemorrhage, unspecified: Secondary | ICD-10-CM | POA: Diagnosis not present

## 2022-08-21 DIAGNOSIS — C9110 Chronic lymphocytic leukemia of B-cell type not having achieved remission: Secondary | ICD-10-CM | POA: Diagnosis not present

## 2022-08-21 DIAGNOSIS — E1122 Type 2 diabetes mellitus with diabetic chronic kidney disease: Secondary | ICD-10-CM | POA: Diagnosis not present

## 2022-08-21 HISTORY — PX: ESOPHAGOGASTRODUODENOSCOPY (EGD) WITH PROPOFOL: SHX5813

## 2022-08-21 HISTORY — PX: BIOPSY: SHX5522

## 2022-08-21 LAB — URINALYSIS, COMPLETE (UACMP) WITH MICROSCOPIC
Bacteria, UA: NONE SEEN
Bilirubin Urine: NEGATIVE
Glucose, UA: NEGATIVE mg/dL
Hgb urine dipstick: NEGATIVE
Ketones, ur: NEGATIVE mg/dL
Leukocytes,Ua: NEGATIVE
Nitrite: NEGATIVE
Protein, ur: 30 mg/dL — AB
Specific Gravity, Urine: 1.018 (ref 1.005–1.030)
Squamous Epithelial / HPF: NONE SEEN /HPF (ref 0–5)
pH: 5 (ref 5.0–8.0)

## 2022-08-21 LAB — GLUCOSE, CAPILLARY
Glucose-Capillary: 108 mg/dL — ABNORMAL HIGH (ref 70–99)
Glucose-Capillary: 82 mg/dL (ref 70–99)
Glucose-Capillary: 92 mg/dL (ref 70–99)
Glucose-Capillary: 107 mg/dL — ABNORMAL HIGH (ref 70–99)

## 2022-08-21 LAB — PREPARE RBC (CROSSMATCH)

## 2022-08-21 LAB — HEMOGLOBIN AND HEMATOCRIT, BLOOD
HCT: 25.2 % — ABNORMAL LOW (ref 39.0–52.0)
Hemoglobin: 7.8 g/dL — ABNORMAL LOW (ref 13.0–17.0)

## 2022-08-21 SURGERY — ESOPHAGOGASTRODUODENOSCOPY (EGD) WITH PROPOFOL
Anesthesia: General

## 2022-08-21 MED ORDER — PROPOFOL 1000 MG/100ML IV EMUL
INTRAVENOUS | Status: AC
  Filled 2022-08-21: qty 100

## 2022-08-21 MED ORDER — PROPOFOL 10 MG/ML IV BOLUS
INTRAVENOUS | Status: DC | PRN
Start: 2022-08-21 — End: 2022-08-21
  Administered 2022-08-21: 30 mg via INTRAVENOUS

## 2022-08-21 MED ORDER — PEG 3350-KCL-NA BICARB-NACL 420 G PO SOLR: 2000.0000 mL | Freq: Once | ORAL | Status: DC | PRN

## 2022-08-21 MED ORDER — PEG 3350-KCL-NA BICARB-NACL 420 G PO SOLR
4000.0000 mL | Freq: Once | ORAL | Status: AC
  Administered 2022-08-21: 4000 mL via ORAL
  Filled 2022-08-21: qty 4000

## 2022-08-21 MED ORDER — LIDOCAINE HCL (PF) 2 % IJ SOLN
INTRAMUSCULAR | Status: AC
  Filled 2022-08-21: qty 5

## 2022-08-21 MED ORDER — BISACODYL 5 MG PO TBEC
10.0000 mg | DELAYED_RELEASE_TABLET | Freq: Once | ORAL | Status: AC
  Administered 2022-08-21: 10 mg via ORAL
  Filled 2022-08-21: qty 2

## 2022-08-21 MED ORDER — POTASSIUM CHLORIDE CRYS ER 20 MEQ PO TBCR
40.0000 meq | EXTENDED_RELEASE_TABLET | Freq: Once | ORAL | Status: AC
  Administered 2022-08-21: 40 meq via ORAL
  Filled 2022-08-21: qty 2

## 2022-08-21 MED ORDER — PROPOFOL 500 MG/50ML IV EMUL
INTRAVENOUS | Status: DC | PRN
  Administered 2022-08-21: 100 ug/kg/min via INTRAVENOUS

## 2022-08-21 MED ORDER — SODIUM CHLORIDE 0.9% IV SOLUTION: Freq: Once | INTRAVENOUS | Status: AC

## 2022-08-21 MED ORDER — TAMSULOSIN HCL 0.4 MG PO CAPS
0.4000 mg | ORAL_CAPSULE | Freq: Every day | ORAL | Status: DC
  Administered 2022-08-21 – 2022-08-24 (×4): 0.4 mg via ORAL
  Filled 2022-08-21 (×4): qty 1

## 2022-08-21 MED ORDER — LIDOCAINE HCL (CARDIAC) PF 100 MG/5ML IV SOSY
PREFILLED_SYRINGE | INTRAVENOUS | Status: DC | PRN
  Administered 2022-08-21: 50 mg via INTRAVENOUS

## 2022-08-21 MED ORDER — PANTOPRAZOLE SODIUM 40 MG PO TBEC
40.0000 mg | DELAYED_RELEASE_TABLET | Freq: Two times a day (BID) | ORAL | Status: DC
  Administered 2022-08-21 – 2022-08-24 (×6): 40 mg via ORAL
  Filled 2022-08-21 (×6): qty 1

## 2022-08-21 NOTE — Op Note (Signed)
[facility] Gastroenterology Patient Name: Paul Briggs Procedure Date: 08/21/2022 10:31 AM MRN: [id number] Account #: [id number] Date of Birth: Oct 20, 1945 Admit Type: Inpatient Age: 77 Room: [facility] ENDO ROOM 4 Gender: Male Note Status: Finalized Instrument Name: Upper Endoscope [id number] Procedure:             Upper GI endoscopy Indications:           Melena Providers:             Trenda Moots, DO Referring MD:          Wilford Corner (Referring MD) Medicines:             Monitored Anesthesia Care Complications:         No immediate complications. Estimated blood loss:                         Minimal. Procedure:             Pre-Anesthesia Assessment:                        - Prior to the procedure, a History and Physical was                         performed, and patient medications and allergies were                         reviewed. The patient is competent. The risks and                         benefits of the procedure and the sedation options and                         risks were discussed with the patient. All questions                         were answered and informed consent was obtained.                         Patient identification and proposed procedure were                         verified by the physician, the nurse, the anesthetist                         and the technician in the endoscopy suite. Mental                         Status Examination: alert and oriented. Airway                         Examination: normal oropharyngeal airway and neck                         mobility. Respiratory Examination: clear to                         auscultation. CV Examination: RRR, no murmurs, no S3  or S4. Prophylactic Antibiotics: The patient does not                         require prophylactic antibiotics. Prior                         Anticoagulants: The patient has taken no anticoagulant                          or antiplatelet agents. ASA Grade Assessment: III - A                         patient with severe systemic disease. After reviewing                         the risks and benefits, the patient was deemed in                         satisfactory condition to undergo the procedure. The                         anesthesia plan was to use monitored anesthesia care                         (MAC). Immediately prior to administration of                         medications, the patient was re-assessed for adequacy                         to receive sedatives. The heart rate, respiratory                         rate, oxygen saturations, blood pressure, adequacy of                         pulmonary ventilation, and response to care were                         monitored throughout the procedure. The physical                         status of the patient was re-assessed after the                         procedure.                        After obtaining informed consent, the endoscope was                         passed under direct vision. Throughout the procedure,                         the patient's blood pressure, pulse, and oxygen                         saturations were monitored continuously. The Endoscope  was introduced through the mouth, and advanced to the                         second part of duodenum. The upper GI endoscopy was                         accomplished without difficulty. The patient tolerated                         the procedure well. Findings:      The duodenal bulb, first portion of the duodenum and second portion of       the duodenum were normal. Estimated blood loss: none.      Localized moderate inflammation characterized by erosions and erythema       was found in the gastric antrum. Biopsies were taken with a cold forceps       for Helicobacter pylori testing. Estimated blood loss was minimal.      A small hiatal hernia was present. Estimated  blood loss: none.      The exam of the stomach was otherwise normal.      The Z-line was regular. Estimated blood loss: none.      Esophagogastric landmarks were identified: the gastroesophageal junction       was found at 40 cm from the incisors.      The exam of the esophagus was otherwise normal. Impression:            - Normal duodenal bulb, first portion of the duodenum                         and second portion of the duodenum.                        - Gastritis. Biopsied.                        - Small hiatal hernia.                        - Z-line regular.                        - Esophagogastric landmarks identified. Recommendation:        - Patient has a contact number available for                         emergencies. The signs and symptoms of potential                         delayed complications were discussed with the patient.                         Return to normal activities tomorrow. Written                         discharge instructions were provided to the patient.                        - Return patient to hospital ward for ongoing care.                        -  Clear liquid diet.                        - Continue present medications.                        - Await pathology results.                        - The findings and recommendations were discussed with                         the patient.                        - The findings and recommendations were discussed with                         the patient's family.                        - The findings and recommendations were discussed with                         the referring physician.                        - Plan for colonoscopy and possible video capsule                         endoscopy tomorrow (pending csy findings) Procedure Code(s):     --- Professional ---                        [phone], Esophagogastroduodenoscopy, flexible,                         transoral; with biopsy, single or  multiple Diagnosis Code(s):     --- Professional ---                        K29.70, Gastritis, unspecified, without bleeding                        K44.9, Diaphragmatic hernia without obstruction or                         gangrene                        K92.1, Melena (includes Hematochezia) CPT copyright 2022 American Medical Association. All rights reserved. The codes documented in this report are preliminary and upon coder review may  be revised to meet current compliance requirements. Attending Participation:      I personally performed the entire procedure. Elfredia Nevins, DO Jaynie Collins DO, DO 08/21/2022 11:28:02 AM This report has been signed electronically. Number of Addenda: 0 Note Initiated On: 08/21/2022 10:31 AM Estimated Blood Loss:  Estimated blood loss was minimal.      [facility]

## 2022-08-21 NOTE — TOC Progression Note (Signed)
Transition of Care (TOC) - Progression Note    Patient Details  Name: Paul Minga [name] MRN: [id number] Date of Birth: 04-Apr-1945  Transition of Care [facility]) CM/SW Contact  Bing Quarry, RN Phone Number: 08/21/2022, 2:54 PM  Clinical Narrative:  08/21/22: Patient admitted 7/26 from [facility] with Hgb of 6.5 to receive blood. Htx: of CLL. Receives Iron infusions at outpatient cancer center. Missed this week per daughter per provider notes. SDOH risk of food insecurity identified and resources added via Care Coordination to AVS. Upper endoscopy was done today.   Gabriel Cirri MSN RN CM  Transitions of Care Department [facility] [phone] Weekends Only          Expected Discharge Plan and Services In-house Referral: [facility] (Currently followed, last contact by phone 08/06/22 re RX refill.)                                             Social Determinants of Health (SDOH) Interventions SDOH Screenings   Food Insecurity: Food Insecurity Present (07/26/2022)  Housing: Low Risk  (07/26/2022)  Transportation Needs: No Transportation Needs (07/26/2022)  Utilities: Not At Risk (07/01/2022)   Received from [facility], [facility]  Alcohol Screen: Low Risk  (01/22/2022)  Depression (PHQ2-9): Low Risk  (08/19/2022)  Financial Resource Strain: Medium Risk (07/26/2022)  Physical Activity: Unknown (07/26/2022)  Social Connections: Moderately Integrated (07/26/2022)  Stress: Stress Concern Present (07/26/2022)  Tobacco Use: Medium Risk (08/21/2022)    Readmission Risk Interventions     No data to display

## 2022-08-21 NOTE — Consult Note (Signed)
Inpatient Consultation   Patient ID: Paul Sanantonio [name] is a 77 y.o. male.  Requesting Provider: Londell Moh, DO  Date of Admission: 08/20/2022  Date of Consult: 08/21/22   Reason for Consultation: anemia, melena   Patient's Chief Complaint:   Chief Complaint  Patient presents with   Anemia    77 year old African-American male with CLL, IDA, CKD, hypertension, lumbago, HLD, DM2 presents to the hospital with anemia.  He is accompanied by his wife at bedside.  Patient was seen at the cancer infusion center for his iron infusion and was found to have a hemoglobin of 6.4.  After receiving his infusion he was sent to the hospital.  Patient reports black bowel movements at home although the emergency physician found rectal exam to be negative.  He has received 2 units of PRBC thus far after little improvement with the first unit.  He notes some dyspnea on exertion.  Appetite and weight have been maintained.  He does feel little weak but denies abdominal pain nausea vomiting hematochezia hematemesis.  Current BC powder use 2x/day  Denies Anti-plt agents, and anticoagulants Denies family history of gastrointestinal disease and malignancy Previous Endoscopies: 03/2014- [facility]  12/2014- [facility]-  07/2015- [facility]- 2 Tas - Two small polyps at the recto-sigmoid colon and at the                        hepatic flexure, removed with a jumbo cold forceps.                        Resected and retrieved.                       - Diverticulosis in the sigmoid colon and in the                        descending colon.                       - The examined portion of the ileum was normal.  No known previous egd    Past Medical History:  Diagnosis Date   Acute prostatitis    BPH (benign prostatic hyperplasia)    Chronic kidney disease    CLL (chronic lymphocytic leukemia) (HCC)    Diabetes (HCC)    Dysplasia of prostate    Erectile dysfunction    HTN (hypertension)    Hypogonadism  in male    Orchitis and epididymitis    Over weight    Palindromic rheumatism, hand    Prostatitis    Rectum pain    Testicle swelling    Testicle tenderness    Testicular mass     Past Surgical History:  Procedure Laterality Date   BACK SURGERY     head surgery     from fall  drilled hole in brain to relieve pressure   TONSILLECTOMY      No Known Allergies  Family History  Problem Relation Age of Onset   Cancer Maternal Aunt    Prostate cancer Neg Hx    Kidney disease Neg Hx    Kidney cancer Neg Hx    Bladder Cancer Neg Hx     Social History   Tobacco Use   Smoking status: Former   Smokeless tobacco: Never   Tobacco comments:    quit 30 years ago  Vaping  Use   Vaping status: Never Used  Substance Use Topics   Alcohol use: No    Alcohol/week: 0.0 standard drinks of alcohol   Drug use: No     Pertinent GI related history and allergies were reviewed with the patient  Review of Systems  Constitutional:  Positive for fatigue. Negative for activity change, appetite change, chills, diaphoresis, fever and unexpected weight change.  HENT:  Negative for trouble swallowing and voice change.   Respiratory:  Positive for shortness of breath. Negative for wheezing.   Cardiovascular:  Negative for chest pain, palpitations and leg swelling.  Gastrointestinal:  Positive for blood in stool (melena). Negative for abdominal distention, abdominal pain, anal bleeding, constipation, diarrhea, nausea and vomiting.  Musculoskeletal:  Negative for arthralgias and myalgias.  Skin:  Negative for color change and pallor.  Neurological:  Positive for weakness. Negative for dizziness and syncope.  Psychiatric/Behavioral:  Negative for confusion. The patient is not nervous/anxious.   All other systems reviewed and are negative.    Medications Home Medications No current facility-administered medications on file prior to encounter.   Current Outpatient Medications on File Prior to  Encounter  Medication Sig Dispense Refill   aspirin EC 81 MG tablet Take 1 tablet (81 mg total) by mouth daily. Swallow whole.     dapagliflozin propanediol (FARXIGA) 10 MG TABS tablet Take 1 tablet (10 mg total) by mouth daily before breakfast. 90 tablet 3   hydrALAZINE (APRESOLINE) 50 MG tablet Take 1 tablet (50 mg total) by mouth in the morning and at bedtime. 60 tablet 3   levothyroxine (SYNTHROID) 200 MCG tablet Take 200 mcg by mouth daily.     metFORMIN (GLUCOPHAGE) 500 MG tablet Take 500 mg by mouth 2 (two) times daily with a meal.     metoprolol succinate (TOPROL XL) 25 MG 24 hr tablet Take 1 tablet (25 mg total) by mouth daily. 90 tablet 3   [START ON 10/22/2022] oxyCODONE-acetaminophen (PERCOCET) 10-325 MG tablet Take 1 tablet by mouth every 8 (eight) hours as needed. 90 tablet 0   pregabalin (LYRICA) 100 MG capsule Take 1 capsule (100 mg total) by mouth 2 (two) times daily. 60 capsule 5   rosuvastatin (CRESTOR) 40 MG tablet Take 1 tablet (40 mg total) by mouth daily. 90 tablet 3   trospium (SANCTURA) 20 MG tablet Take 1 tablet (20 mg total) by mouth 2 (two) times daily. 60 tablet 0   valsartan (DIOVAN) 160 MG tablet Take 160 mg by mouth daily.     zanubrutinib (BRUKINSA) 80 MG capsule Take 2 capsules (160 mg total) by mouth 2 (two) times daily. 120 capsule 1   metoprolol tartrate (LOPRESSOR) 100 MG tablet Take 1 tablet (100 mg total) by mouth once for 1 dose. 2 hours prior to test (Patient not taking: Reported on 08/20/2022) 1 tablet 0   [START ON 08/23/2022] oxyCODONE-acetaminophen (PERCOCET) 10-325 MG tablet Take 1 tablet by mouth every 8 (eight) hours as needed. 90 tablet 0   [START ON 09/22/2022] oxyCODONE-acetaminophen (PERCOCET) 10-325 MG tablet Take 1 tablet by mouth every 8 (eight) hours as needed. 90 tablet 0   Pertinent GI related medications were reviewed with the patient  Inpatient Medications  Current Facility-Administered Medications:    0.9 %  sodium chloride infusion  (Manually program via Guardrails IV Fluids), , Intravenous, Once, Manuela Schwartz, NP   0.9 %  sodium chloride infusion, 10 mL/hr, Intravenous, Once, Cox, Amy N, DO   acetaminophen (TYLENOL) tablet 650 mg, 650 mg,  Oral, Q6H PRN **OR** acetaminophen (TYLENOL) suppository 650 mg, 650 mg, Rectal, Q6H PRN, Cox, Amy N, DO   hydrALAZINE (APRESOLINE) injection 5 mg, 5 mg, Intravenous, Q8H PRN, Cox, Amy N, DO   hydrALAZINE (APRESOLINE) tablet 50 mg, 50 mg, Oral, BID, Cox, Amy N, DO, 50 mg at 08/21/22 0800   insulin aspart (novoLOG) injection 0-5 Units, 0-5 Units, Subcutaneous, QHS, Cox, Amy N, DO   insulin aspart (novoLOG) injection 0-9 Units, 0-9 Units, Subcutaneous, TID WC, Cox, Amy N, DO   irbesartan (AVAPRO) tablet 150 mg, 150 mg, Oral, Daily, Cox, Amy N, DO, 150 mg at 08/21/22 0800   levothyroxine (SYNTHROID) tablet 200 mcg, 200 mcg, Oral, QAC breakfast, Cox, Amy N, DO, 200 mcg at 08/21/22 0738   melatonin tablet 5 mg, 5 mg, Oral, QHS PRN, Cox, Amy N, DO, 5 mg at 08/20/22 2239   metoprolol succinate (TOPROL-XL) 24 hr tablet 25 mg, 25 mg, Oral, Daily, Cox, Amy N, DO   ondansetron (ZOFRAN) tablet 4 mg, 4 mg, Oral, Q6H PRN **OR** ondansetron (ZOFRAN) injection 4 mg, 4 mg, Intravenous, Q6H PRN, Cox, Amy N, DO, 4 mg at 08/21/22 0457   oxyCODONE-acetaminophen (PERCOCET/ROXICET) 5-325 MG per tablet 1 tablet, 1 tablet, Oral, Q8H PRN, 1 tablet at 08/21/22 0739 **AND** oxyCODONE (Oxy IR/ROXICODONE) immediate release tablet 5 mg, 5 mg, Oral, Q8H PRN, Cox, Amy N, DO, 5 mg at 08/21/22 0739   pantoprazole (PROTONIX) 80 mg /NS 100 mL IVPB, 80 mg, Intravenous, BID, Cox, Amy N, DO, Last Rate: 300 mL/hr at 08/20/22 2143, 80 mg at 08/20/22 2143   pregabalin (LYRICA) capsule 100 mg, 100 mg, Oral, BID, Cox, Amy N, DO, 100 mg at 08/21/22 0800   rosuvastatin (CRESTOR) tablet 40 mg, 40 mg, Oral, QHS, Cox, Amy N, DO   senna-docusate (Senokot-S) tablet 1 tablet, 1 tablet, Oral, QHS PRN, Cox, Amy N, DO   tamsulosin (FLOMAX)  capsule 0.4 mg, 0.4 mg, Oral, Daily, Sreenath, Sudheer B, MD, 0.4 mg at 08/21/22 0841  sodium chloride     pantoprazole (PROTONIX) IV 80 mg (08/20/22 2143)    acetaminophen **OR** acetaminophen, hydrALAZINE, melatonin, ondansetron **OR** ondansetron (ZOFRAN) IV, oxyCODONE-acetaminophen **AND** oxyCODONE, senna-docusate   Objective   Vitals:   08/21/22 0500 08/21/22 0701 08/21/22 0732 08/21/22 0751  BP: (!) 140/57 (!) 146/76 (!) 169/85 136/69  Pulse:  (!) 55  (!) 55  Resp:  20 20 14   Temp:  98.5 F (36.9 C) 97.7 F (36.5 C) 98.3 F (36.8 C)  TempSrc:   Oral Oral  SpO2:  95% 97% 94%  Weight:      Height:         Physical Exam Vitals and nursing note reviewed.  Constitutional:      General: He is not in acute distress.    Appearance: He is ill-appearing (chronically). He is not toxic-appearing or diaphoretic.  HENT:     Head: Normocephalic and atraumatic.     Nose: Nose normal.     Mouth/Throat:     Mouth: Mucous membranes are moist.     Pharynx: Oropharynx is clear.  Eyes:     General: No scleral icterus.    Extraocular Movements: Extraocular movements intact.  Cardiovascular:     Rate and Rhythm: Normal rate and regular rhythm.     Heart sounds: Normal heart sounds. No murmur heard.    No friction rub. No gallop.  Pulmonary:     Effort: Pulmonary effort is normal. No respiratory distress.  Breath sounds: Normal breath sounds. No wheezing, rhonchi or rales.  Abdominal:     General: Abdomen is flat. Bowel sounds are normal. There is no distension.     Palpations: Abdomen is soft.     Tenderness: There is no abdominal tenderness. There is no guarding or rebound.  Musculoskeletal:     Cervical back: Neck supple.     Right lower leg: No edema.     Left lower leg: No edema.  Skin:    General: Skin is warm and dry.     Coloration: Skin is not jaundiced or pale.  Neurological:     General: No focal deficit present.     Mental Status: He is alert and oriented to  person, place, and time. Mental status is at baseline.  Psychiatric:        Mood and Affect: Mood normal.        Behavior: Behavior normal.        Thought Content: Thought content normal.        Judgment: Judgment normal.     Laboratory Data Recent Labs  Lab 08/20/22 1231 08/20/22 1350 08/20/22 2210 08/21/22 0216  WBC 7.0 6.8  --  6.4  HGB 6.4* 6.1* 5.7* 6.4*  HCT 20.9* 19.9* 18.6* 20.5*  PLT 150 155  --  147*   Recent Labs  Lab 08/20/22 1350 08/21/22 0216  NA 139 137  K 4.1 3.2*  CL 107 110  CO2 24 20*  BUN 16 14  CALCIUM 8.0* 7.7*  PROT 6.1*  --   BILITOT 1.0  --   ALKPHOS 44  --   ALT 23  --   AST 61*  --   GLUCOSE 115* 118*   No results for input(s): "INR" in the last 168 hours.  No results for input(s): "LIPASE" in the last 72 hours.      Imaging Studies: No results found.  Assessment:   # Acute on chronic symptomatic anemia - pt reports weakness, DOE - h/o DOE with chronic anemia, as well as CKD and iron deficiency - currently iron deficient based on labs - has rcd 2 u prbc thus far. - baseline 7.4-8.4 over last 2 months  # Reported melena  # nsaid dependence  # Elevated AST - other liver values wnl. Could also be from muscle as well. Pt denies drinking  # CLL - on Zanubrutinib   # CKD  # HTN, HLD, DM2, OSA on CPAP, hypothyroid, chronic back pain  Plan:   Esophagogastroduodenoscopy planned for today pending patient stability and endoscopy suite availability NPO now Labs reviewed by me Protonix 40 mg iv q12 h Hold dvt ppx  Monitor H&H.  Transfusion and resuscitation as per primary team Avoid frequent lab draws to prevent lab induced anemia Supportive care and antiemetics as per primary team Maintain two sites IV access Avoid nsaids Monitor for GIB.  Further recommendations pending upper endoscopy findings. See op report for further details  Esophagogastroduodenoscopy with possible biopsy, control of bleeding, polypectomy, and  interventions as necessary has been discussed with the patient/patient representative. Informed consent was obtained from the patient/patient representative after explaining the indication, nature, and risks of the procedure including but not limited to death, bleeding, perforation, missed neoplasm/lesions, cardiorespiratory compromise, and reaction to medications. Opportunity for questions was given and appropriate answers were provided. Patient/patient representative has verbalized understanding is amenable to undergoing the procedure.   I personally performed the service.  Management of other medical comorbidities as per primary  team  Thank you for allowing [place] to participate in this patient's care. Please don't hesitate to call if any questions or concerns arise.   Jaynie Collins, DO [facility]  Portions of the record may have been created with voice recognition software. Occasional wrong-word or 'sound-a-like' substitutions may have occurred due to the inherent limitations of voice recognition software.  Read the chart carefully and recognize, using context, where substitutions may have occurred.

## 2022-08-21 NOTE — Discharge Instructions (Signed)
Food Resources  Agency Name: [facility] Address: [address] [place], [place] [address] Phone: [phone] Website: [email or web address] Service(s) Offered: Housing services, self-sufficiency, congregate meal program, weatherization program, Event organiser program, emergency food assistance,  housing counseling, home ownership program, wheels - to work program.  [employer] free for 60 and older at various locations from [employer], Monday-Friday:  [employer], [address] [place], [phone] -[facility], [address], [place] [phone]  -[facility], [address], [place] [phone]  -[address], [address], [place], [phone]  Agency Name: [facility] [place] [facility] Address: [phone] [address], [address], [place], [place] [address] Phone: [phone] Website: [email or web address] Service(s) Offered: Home delivered hot, frozen, and emergency  meals. Grocery assistance program which matches  volunteers one-on-one with seniors unable to grocery shop  for themselves. Must be 60 years and older; less than 20  hours of in-home aide service, limited or no driving ability;  live alone or with someone with a disability; live in  [place].  Agency Name: [facility] [employer]) Address: [address] [place], [place] [address] Phone: [phone] Service(s) Offered: Food is served to shut-ins, homeless, elderly, and low income people in the community every Saturday (11:30 am-12:30 pm) and Sunday (12:30 pm-1:30pm). Volunteers also offer help and encouragement in seeking employment,  and spiritual guidance.  Agency Name: [employer] Address: [address] [place], [place] [address] Phone: [phone] Service(s) Offered: Child support services; child welfare services; food stamps; Medicaid; work first family assistance; and aid  with fuel,  rent, food and medicine.  Agency Name: [employer] Address: [address], [place], [place] Phone: [phone] Website: [email or web address] Services Offered: Monday 10:00am-12:00, 8:00pm-9:00pm, and Friday 10:00am-12:00.  Agency Name: [employer] Address: [address], [place], [place] [address] Phone: [phone] Website: [email or web address] Service(s) Offered: Serves weekday meals, open from 11:30 am- 1:00 pm., and 6:30-7:30pm, Monday-Wednesday-Friday distributes food 3:30-6pm, Monday-Wednesday-Friday.  Agency Name: [facility] Address: [address] [place], [place] Phone: [phone] Website: [email or web address] Services Offered: Distributes food the 4th Saturday of the month, starting at 8:00 am  Agency Name: [facility] Address: [phone] [address] [place], [place] [address] Phone: [phone] Website: [email or web address] Service(s) Offered: Bread of life, weekly food pantry. Open Wednesdays from 10:00am-noon.  Agency Name: [facility] [employer] Address: [address] [place], [place], [place] Phone: [phone] Services Offered: Distributes food 9am-1pm, Monday-Thursday. Call for details.  Agency Name: [facility] Address: [address], [place], [place] [address] Phone: [phone] Website: [email or web address] Service(s) Offered: Games developer. Call for assistance.  Agency Name: [facility] Address: [address] [place], [place] [address] Phone: [phone] Service Offered: Emergency Food Pantry. Call for appointment.  Agency Name: [facility] Address: [address], [place], [place] [address] Phone: [phone] Website: [email or web address] Services Offered: Games developer. Call for details  Agency Name: [facility] Address: [address] [place], [place] Phone:  [phone] Website: [email or web address] Service(s) Offered: Emergency Food Pantry. Call for details.  Agency Name: Holiday representative Address: [address] [place], [place] [address] Phone: [phone] or [phone] Website: [email or web address] Service(s) Offered: Distribute food 9am-11:30 am, Tuesday-Friday, and 1-3:30pm, Monday-Friday. Food pantry Monday-Friday 1pm-3pm, fresh items, Mon.-Wed.-Fri.  Agency Name: [facility] Address: [address] [place], [place] [address] Phone: [phone] Website: [email or web address] Services Offered: Distribute food Tues and Sats from 9:00am-noon.  Closed 1st Saturday of each month. Call for details

## 2022-08-21 NOTE — Anesthesia Procedure Notes (Signed)
Procedure Name: MAC Date/Time: 08/21/2022 11:03 AM  Performed by: Hezzie Bump, CRNAPre-anesthesia Checklist: Patient identified, Emergency Drugs available, Suction available and Patient being monitored Patient Re-evaluated:Patient Re-evaluated prior to induction Oxygen Delivery Method: Simple face mask Induction Type: IV induction Placement Confirmation: positive ETCO2

## 2022-08-21 NOTE — Progress Notes (Signed)
PROGRESS NOTE    Paul Berendsen [name]  NGE:[id number] DOB: 04-Mar-1945 DOA: 08/20/2022 PCP: Wilford Corner, PA-C    Brief Narrative:  77 year old male with history of CLL, iron deficiency anemia, CKD 3A, hypertension, chronic low back pain, hyperlipidemia, non-insulin-dependent diabetes mellitus, who presents emergency department from cancer center for chief concerns of low hemoglobin at 6.4.   His daughter is concerned that the reason why his hemoglobin was so low today, is because he missed the iron transfusion last week.  He came in today to outpatient cancer center and received his IV transfusion on 7/26 prior to ED presentation.  He was ultimately sent to the emergency department as he had regular labs done and showed hemoglobin low at 6.4.  7/27: Status post EGD with GI.  Unremarkable.  Plan for colonoscopy 7/28     Assessment & Plan:   Principal Problem:   Acute on chronic anemia Active Problems:   Essential hypertension   Type II diabetes mellitus with renal manifestations (HCC)   BPH with obstruction/lower urinary tract symptoms   HLD (hyperlipidemia)   Hypothyroidism   Chronic back pain   OSA on CPAP   CLL (chronic lymphocytic leukemia) (HCC)   IDA (iron deficiency anemia)   Acute on chronic anemia Differentials include GI bleed versus CLL versus anemia chronic kidney disease versus iron deficiency anemia Status post EGD 7/27, unremarkable Plan: Clear liquid diet today Bowel prep for possible colonoscopy tomorrow P.o. PPI twice daily N.p.o. after midnight Essential hypertension PTA irbesartan PTA metoprolol PTA hydralazine   Type II diabetes mellitus with renal manifestations (HCC) Non-insulin-dependent diabetes mellitus Hold home metformin Moderate sliding scale with bedtime coverage   HLD (hyperlipidemia) PTA statin   Hypothyroidism PTA Synthroid   IDA (iron deficiency anemia) Anemia panel ordered   CLL (chronic lymphocytic leukemia)  (HCC) Holding home Zanubrutinib while inpatient Will discuss case with patient's oncologist on Monday 7/29 to discuss timing of restart   OSA on CPAP CPAP nightly ordered   Chronic back pain PTA Percocet   DVT prophylaxis: SCD Code Status: Full Family Communication: Wife at bedside 7/27 Disposition Plan: Status is: Inpatient Remains inpatient appropriate because: Acute on chronic anemia   Level of care: Telemetry Cardiac  Consultants:  GI  Procedures:  EGD 7/27  Antimicrobials: None   Subjective: Seen and examined.  Resting in bed.  No visible distress.  No complaints of pain.  Wife at bedside.  Objective: Vitals:   08/21/22 1037 08/21/22 1118 08/21/22 1128 08/21/22 1213  BP: (!) 143/77 130/78 139/77 127/64  Pulse: (!) 54 62 (!) 57 (!) 55  Resp: 18 14 13 20   Temp: 97.8 F (36.6 C) 97.6 F (36.4 C)  (!) 97.4 F (36.3 C)  TempSrc: Oral Temporal    SpO2: 97% 96% 95% 96%  Weight:      Height:        Intake/Output Summary (Last 24 hours) at 08/21/2022 1249 Last data filed at 08/21/2022 1117 Gross per 24 hour  Intake 1520.42 ml  Output 375 ml  Net 1145.42 ml   Filed Weights   08/20/22 1348  Weight: 107.5 kg    Examination:  General exam: NAD Respiratory system: Clear to auscultation. Respiratory effort normal. Cardiovascular system: S1-S2, RRR, no murmurs, no pedal edema Gastrointestinal system: Soft, NT/ND, normal bowel sounds Central nervous system: Alert and oriented. No focal neurological deficits. Extremities: Symmetric 5 x 5 power. Skin: No rashes, lesions or ulcers Psychiatry: Judgement and insight appear normal. Mood & affect  appropriate.     Data Reviewed: I have personally reviewed following labs and imaging studies  CBC: Recent Labs  Lab 08/20/22 1231 08/20/22 1350 08/20/22 2210 08/21/22 0216 08/21/22 1220  WBC 7.0 6.8  --  6.4  --   NEUTROABS 2.5  --   --   --   --   HGB 6.4* 6.1* 5.7* 6.4* 7.8*  HCT 20.9* 19.9* 18.6* 20.5*  25.2*  MCV 91.3 89.6  --  90.7  --   PLT 150 155  --  147*  --    Basic Metabolic Panel: Recent Labs  Lab 08/20/22 1350 08/21/22 0216  NA 139 137  K 4.1 3.2*  CL 107 110  CO2 24 20*  GLUCOSE 115* 118*  BUN 16 14  CREATININE 1.35* 1.13  CALCIUM 8.0* 7.7*   GFR: Estimated Creatinine Clearance: 71.5 mL/min (by C-G formula based on SCr of 1.13 mg/dL). Liver Function Tests: Recent Labs  Lab 08/20/22 1350  AST 61*  ALT 23  ALKPHOS 44  BILITOT 1.0  PROT 6.1*  ALBUMIN 3.4*   No results for input(s): "LIPASE", "AMYLASE" in the last 168 hours. No results for input(s): "AMMONIA" in the last 168 hours. Coagulation Profile: No results for input(s): "INR", "PROTIME" in the last 168 hours. Cardiac Enzymes: No results for input(s): "CKTOTAL", "CKMB", "CKMBINDEX", "TROPONINI" in the last 168 hours. BNP (last 3 results) No results for input(s): "PROBNP" in the last 8760 hours. HbA1C: No results for input(s): "HGBA1C" in the last 72 hours. CBG: Recent Labs  Lab 08/20/22 2126 08/21/22 0843 08/21/22 1243  GLUCAP 118* 108* 82   Lipid Profile: No results for input(s): "CHOL", "HDL", "LDLCALC", "TRIG", "CHOLHDL", "LDLDIRECT" in the last 72 hours. Thyroid Function Tests: No results for input(s): "TSH", "T4TOTAL", "FREET4", "T3FREE", "THYROIDAB" in the last 72 hours. Anemia Panel: Recent Labs    08/20/22 1231 08/20/22 1350 08/20/22 2210  VITAMINB12  --   --  270  FOLATE  --  24.0  --   FERRITIN  --  12*  --   TIBC  --  295  --   IRON  --  132  --   RETICCTPCT 2.2 2.3  --    Sepsis Labs: No results for input(s): "PROCALCITON", "LATICACIDVEN" in the last 168 hours.  No results found for this or any previous visit (from the past 240 hour(s)).       Radiology Studies: No results found.      Scheduled Meds:  sodium chloride   Intravenous Once   bisacodyl  10 mg Oral Once   hydrALAZINE  50 mg Oral BID   insulin aspart  0-5 Units Subcutaneous QHS   insulin aspart   0-9 Units Subcutaneous TID WC   irbesartan  150 mg Oral Daily   levothyroxine  200 mcg Oral QAC breakfast   metoprolol succinate  25 mg Oral Daily   pantoprazole  40 mg Oral BID AC   polyethylene glycol-electrolytes  4,000 mL Oral Once   pregabalin  100 mg Oral BID   rosuvastatin  40 mg Oral QHS   tamsulosin  0.4 mg Oral Daily   Continuous Infusions:   LOS: 1 day       Tresa Moore, MD [facility]   If 7PM-7AM, please contact night-coverage  08/21/2022, 12:49 PM

## 2022-08-21 NOTE — Transfer of Care (Signed)
Immediate Anesthesia Transfer of Care Note  Patient: Paul Palubicki [name]  Procedure(s) Performed: ESOPHAGOGASTRODUODENOSCOPY (EGD) WITH PROPOFOL BIOPSY  Patient Location: PACU  Anesthesia Type:General  Level of Consciousness: awake  Airway & Oxygen Therapy: Patient Spontanous Breathing  Post-op Assessment: Report given to RN and Post -op Vital signs reviewed and stable  Post vital signs: Reviewed and stable  Last Vitals:  Vitals Value Taken Time  BP    Temp    Pulse    Resp    SpO2      Last Pain:  Vitals:   08/21/22 1037  TempSrc: Oral  PainSc:          Complications: No notable events documented.

## 2022-08-21 NOTE — Anesthesia Preprocedure Evaluation (Signed)
Anesthesia Evaluation  Patient identified by MRN, date of birth, ID band Patient awake    Reviewed: Allergy & Precautions, H&P , NPO status , Patient's Chart, lab work & pertinent test results, reviewed documented beta blocker date and time   History of Anesthesia Complications Negative for: history of anesthetic complications  Airway Mallampati: III  TM Distance: >3 FB Neck ROM: full    Dental  (+) Dental Advidsory Given, Poor Dentition, Chipped, Missing   Pulmonary neg shortness of breath, sleep apnea and Continuous Positive Airway Pressure Ventilation , neg COPD, neg recent URI, former smoker   Pulmonary exam normal breath sounds clear to auscultation       Cardiovascular Exercise Tolerance: Good hypertension, (-) angina (-) Past MI and (-) Cardiac Stents Normal cardiovascular exam(-) dysrhythmias (-) Valvular Problems/Murmurs Rhythm:regular Rate:Normal     Neuro/Psych negative neurological ROS  negative psych ROS   GI/Hepatic negative GI ROS, Neg liver ROS,,,  Endo/Other  diabetesHypothyroidism    Renal/GU CRFRenal disease  negative genitourinary   Musculoskeletal   Abdominal   Peds  Hematology  (+) Blood dyscrasia, anemia   Anesthesia Other Findings Past Medical History: No date: Acute prostatitis No date: BPH (benign prostatic hyperplasia) No date: Chronic kidney disease No date: CLL (chronic lymphocytic leukemia) (HCC) No date: Diabetes (HCC) No date: Dysplasia of prostate No date: Erectile dysfunction No date: HTN (hypertension) No date: Hypogonadism in male No date: Orchitis and epididymitis No date: Over weight 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, hand No date: Prostatitis No date: Rectum pain No date: Testicle swelling No date: Testicle tenderness No date: Testicular mass   Reproductive/Obstetrics negative OB ROS                             Anesthesia Physical Anesthesia  Plan  ASA: 3  Anesthesia Plan: General   Post-op Pain Management:    Induction: Intravenous  PONV Risk Score and Plan: 2 and Propofol infusion and TIVA  Airway Management Planned: Natural Airway and Nasal Cannula  Additional Equipment:   Intra-op Plan:   Post-operative Plan:   Informed Consent: I have reviewed the patients History and Physical, chart, labs and discussed the procedure including the risks, benefits and alternatives for the proposed anesthesia with the patient or authorized representative who has indicated his/her understanding and acceptance.     Dental Advisory Given  Plan Discussed with: Anesthesiologist, CRNA and Surgeon  Anesthesia Plan Comments:         Anesthesia Quick Evaluation

## 2022-08-22 ENCOUNTER — Encounter: Payer: Self-pay | Admitting: Gastroenterology

## 2022-08-22 DIAGNOSIS — D649 Anemia, unspecified: Secondary | ICD-10-CM | POA: Diagnosis not present

## 2022-08-22 LAB — CBC
HCT: 25.8 % — ABNORMAL LOW (ref 39.0–52.0)
Hemoglobin: 8.2 g/dL — ABNORMAL LOW (ref 13.0–17.0)
MCH: 28.1 pg (ref 26.0–34.0)
MCHC: 31.8 g/dL (ref 30.0–36.0)
MCV: 88.4 fL (ref 80.0–100.0)
Platelets: 143 10*3/uL — ABNORMAL LOW (ref 150–400)
RBC: 2.92 MIL/uL — ABNORMAL LOW (ref 4.22–5.81)
RDW: 17.2 % — ABNORMAL HIGH (ref 11.5–15.5)
WBC: 7.4 10*3/uL (ref 4.0–10.5)
nRBC: 0 % (ref 0.0–0.2)

## 2022-08-22 LAB — GLUCOSE, CAPILLARY
Glucose-Capillary: 88 mg/dL (ref 70–99)
Glucose-Capillary: 83 mg/dL (ref 70–99)
Glucose-Capillary: 174 mg/dL — ABNORMAL HIGH (ref 70–99)
Glucose-Capillary: 68 mg/dL — ABNORMAL LOW (ref 70–99)
Glucose-Capillary: 103 mg/dL — ABNORMAL HIGH (ref 70–99)

## 2022-08-22 LAB — HEMOGLOBIN AND HEMATOCRIT, BLOOD
HCT: 26.4 % — ABNORMAL LOW (ref 39.0–52.0)
Hemoglobin: 8.3 g/dL — ABNORMAL LOW (ref 13.0–17.0)

## 2022-08-22 LAB — BASIC METABOLIC PANEL WITH GFR
Anion gap: 6 (ref 5–15)
BUN: 9 mg/dL (ref 8–23)
CO2: 23 mmol/L (ref 22–32)
Calcium: 7.9 mg/dL — ABNORMAL LOW (ref 8.9–10.3)
Chloride: 112 mmol/L — ABNORMAL HIGH (ref 98–111)
Creatinine, Ser: 0.99 mg/dL (ref 0.61–1.24)
GFR, Estimated: >60 mL/min (ref >60)
Glucose, Bld: 92 mg/dL (ref 70–99)
Potassium: 3.7 mmol/L (ref 3.5–5.1)
Sodium: 141 mmol/L (ref 135–145)

## 2022-08-22 MED ORDER — PEG 3350-KCL-NA BICARB-NACL 420 G PO SOLR
4000.0000 mL | Freq: Once | ORAL | Status: AC
  Administered 2022-08-22: 4000 mL via ORAL
  Filled 2022-08-22: qty 4000

## 2022-08-22 NOTE — H&P (View-Only) (Signed)
Inpatient Follow-up/Progress Note   Patient ID: Paul Scobey [name] is a 77 y.o. male.  Overnight Events / Subjective Findings NAEON.  Hemoglobin stable.  Vital signs stable.  He notes his stools have lightened after completing his prep, but not clear/see-through and still black at this time.  He denies nausea vomiting abdominal pain. Gastritis found on EGD yesterday with small erosions which is likely contributory source to his dark stools and anemia.  Biopsies pending No other acute GI complaints  Review of Systems  Constitutional:  Positive for fatigue. Negative for activity change, appetite change, chills, diaphoresis, fever and unexpected weight change.  HENT:  Negative for trouble swallowing and voice change.   Respiratory:  Positive for shortness of breath. Negative for wheezing.   Cardiovascular:  Negative for chest pain, palpitations and leg swelling.  Gastrointestinal:  Positive for blood in stool (melena). Negative for abdominal distention, abdominal pain, anal bleeding, constipation, diarrhea, nausea and vomiting.  Musculoskeletal:  Negative for arthralgias and myalgias.  Skin:  Negative for color change and pallor.  Neurological:  Positive for weakness. Negative for dizziness and syncope.  Psychiatric/Behavioral:  Negative for confusion. The patient is not nervous/anxious.   All other systems reviewed and are negative.    Medications  Current Facility-Administered Medications:    0.9 %  sodium chloride infusion (Manually program via Guardrails IV Fluids), , Intravenous, Once, Manuela Schwartz, NP   acetaminophen (TYLENOL) tablet 650 mg, 650 mg, Oral, Q6H PRN **OR** acetaminophen (TYLENOL) suppository 650 mg, 650 mg, Rectal, Q6H PRN, Cox, Amy N, DO   hydrALAZINE (APRESOLINE) injection 5 mg, 5 mg, Intravenous, Q8H PRN, Cox, Amy N, DO   hydrALAZINE (APRESOLINE) tablet 50 mg, 50 mg, Oral, BID, Cox, Amy N, DO, 50 mg at 08/22/22 0933   insulin aspart (novoLOG) injection  0-5 Units, 0-5 Units, Subcutaneous, QHS, Cox, Amy N, DO   insulin aspart (novoLOG) injection 0-9 Units, 0-9 Units, Subcutaneous, TID WC, Cox, Amy N, DO   irbesartan (AVAPRO) tablet 150 mg, 150 mg, Oral, Daily, Cox, Amy N, DO, 150 mg at 08/22/22 0930   levothyroxine (SYNTHROID) tablet 200 mcg, 200 mcg, Oral, QAC breakfast, Cox, Amy N, DO, 200 mcg at 08/21/22 0738   melatonin tablet 5 mg, 5 mg, Oral, QHS PRN, Cox, Amy N, DO, 5 mg at 08/20/22 2239   metoprolol succinate (TOPROL-XL) 24 hr tablet 25 mg, 25 mg, Oral, Daily, Cox, Amy N, DO, 25 mg at 08/22/22 0931   ondansetron (ZOFRAN) tablet 4 mg, 4 mg, Oral, Q6H PRN **OR** ondansetron (ZOFRAN) injection 4 mg, 4 mg, Intravenous, Q6H PRN, Cox, Amy N, DO, 4 mg at 08/21/22 2122   oxyCODONE-acetaminophen (PERCOCET/ROXICET) 5-325 MG per tablet 1 tablet, 1 tablet, Oral, Q8H PRN, 1 tablet at 08/21/22 0739 **AND** oxyCODONE (Oxy IR/ROXICODONE) immediate release tablet 5 mg, 5 mg, Oral, Q8H PRN, Cox, Amy N, DO, 5 mg at 08/21/22 0739   pantoprazole (PROTONIX) EC tablet 40 mg, 40 mg, Oral, BID AC, Sreenath, Sudheer B, MD, 40 mg at 08/22/22 0930   polyethylene glycol-electrolytes (NuLYTELY) solution 2,000 mL, 2,000 mL, Oral, Once PRN, Jaynie Collins, DO   pregabalin (LYRICA) capsule 100 mg, 100 mg, Oral, BID, Cox, Amy N, DO, 100 mg at 08/22/22 0930   rosuvastatin (CRESTOR) tablet 40 mg, 40 mg, Oral, QHS, Cox, Amy N, DO   senna-docusate (Senokot-S) tablet 1 tablet, 1 tablet, Oral, QHS PRN, Cox, Amy N, DO   tamsulosin (FLOMAX) capsule 0.4 mg, 0.4 mg, Oral, Daily, Sreenath, [employer]  B, MD, 0.4 mg at 08/22/22 0931   acetaminophen **OR** acetaminophen, hydrALAZINE, melatonin, ondansetron **OR** ondansetron (ZOFRAN) IV, oxyCODONE-acetaminophen **AND** oxyCODONE, polyethylene glycol-electrolytes, senna-docusate   Objective    Vitals:   08/21/22 1953 08/22/22 0008 08/22/22 0448 08/22/22 0817  BP: (!) 170/78 (!) 148/69 (!) 149/74 (!) 160/70  Pulse: 61 (!) 57 (!)  53 (!) 49  Resp: 17 14 14 18   Temp: 98.1 F (36.7 C) 98.7 F (37.1 C) 98.1 F (36.7 C) 97.8 F (36.6 C)  TempSrc:      SpO2: 100% 100% 100% 96%  Weight:      Height:         Physical Exam Vitals and nursing note reviewed.  Constitutional:      General: He is not in acute distress.    Appearance: He is ill-appearing (chronically). He is not toxic-appearing or diaphoretic.  HENT:     Head: Normocephalic and atraumatic.     Nose: Nose normal.     Mouth/Throat:     Mouth: Mucous membranes are moist.     Pharynx: Oropharynx is clear.  Eyes:     General: No scleral icterus.    Extraocular Movements: Extraocular movements intact.  Cardiovascular:     Rate and Rhythm: Regular rhythm. Bradycardia present.     Heart sounds: Murmur heard.  Pulmonary:     Effort: Pulmonary effort is normal. No respiratory distress.     Breath sounds: Normal breath sounds. No wheezing, rhonchi or rales.  Abdominal:     General: Abdomen is flat. Bowel sounds are normal. There is no distension.     Palpations: Abdomen is soft.     Tenderness: There is no abdominal tenderness. There is no guarding or rebound.  Musculoskeletal:     Cervical back: Neck supple.     Right lower leg: No edema.     Left lower leg: No edema.  Skin:    General: Skin is warm and dry.     Coloration: Skin is not jaundiced or pale.  Neurological:     General: No focal deficit present.     Mental Status: He is alert and oriented to person, place, and time. Mental status is at baseline.  Psychiatric:        Mood and Affect: Mood normal.        Behavior: Behavior normal.        Thought Content: Thought content normal.        Judgment: Judgment normal.      Laboratory Data Recent Labs  Lab 08/20/22 1231 08/20/22 1350 08/20/22 2210 08/21/22 0216 08/21/22 1220 08/22/22 0052 08/22/22 0412  WBC 7.0 6.8  --  6.4  --   --  7.4  HGB 6.4* 6.1*   < > 6.4* 7.8* 8.3* 8.2*  HCT 20.9* 19.9*   < > 20.5* 25.2* 26.4* 25.8*   PLT 150 155  --  147*  --   --  143*  NEUTOPHILPCT 36  --   --   --   --   --   --   LYMPHOPCT 55  --   --   --   --   --   --   MONOPCT 6  --   --   --   --   --   --   EOSPCT 2  --   --   --   --   --   --    < > = values in this interval not displayed.  Recent Labs  Lab 08/20/22 1350 08/21/22 0216 08/22/22 0412  NA 139 137 141  K 4.1 3.2* 3.7  CL 107 110 112*  CO2 24 20* 23  BUN 16 14 9   CREATININE 1.35* 1.13 0.99  CALCIUM 8.0* 7.7* 7.9*  PROT 6.1*  --   --   BILITOT 1.0  --   --   ALKPHOS 44  --   --   ALT 23  --   --   AST 61*  --   --   GLUCOSE 115* 118* 92   No results for input(s): "INR" in the last 168 hours.    Imaging Studies: No results found.  Assessment:   # Acute on chronic symptomatic anemia - EGD 08/21/22- gastritis with erosions- likely contributing to anemia/melena - plan for CSY today if prep ok - hgb has stabilized (8.2). VSS.  - pt reports weakness, DOE - h/o DOE with chronic anemia, as well as CKD and iron deficiency - currently iron deficient based on labs - has rcd 2 u prbc thus far. - baseline 7.4-8.4 over last 2 months   # Reported melena   # nsaid dependence   # Elevated AST - other liver values wnl. Could also be from muscle as well. Pt denies drinking   # CLL - on Zanubrutinib    # CKD   # HTN, HLD, DM2, OSA on CPAP, hypothyroid, chronic back pain  Plan:  Pt completed prep o/n, however, stools not clear/see-through- he indicates they are still black/tarry but lightened from previous Past colonoscopy reports have indicated that he is difficult to prep and would require two day prep Given that we are looking for bleeding source, would recommend continued prep and postponing colonoscopy to tomorrow as he is stable from a hemoglobin and vital signs perspective Resume clear liquids now.  Npo at midnight Hgb and vss Potential VCE if colonoscopy negative Continue protonix 40 mg po q12h Labs this morning reviewed by  me  Monitor H&H.  Transfusion and resuscitation as per primary team Avoid frequent lab draws to prevent lab induced anemia Supportive care and antiemetics as per primary team Maintain two sites IV access Avoid nsaids Monitor for GIB.   Colonoscopy and video capsule endoscopy with possible biopsy, control of bleeding, polypectomy, and interventions as necessary has been discussed with the patient/patient representative. Informed consent was obtained from the patient/patient representative after explaining the indication, nature, and risks of the procedure including but not limited to death, bleeding, perforation, missed neoplasm/lesions, cardiorespiratory compromise, and reaction to medications. Opportunity for questions was given and appropriate answers were provided. Patient/patient representative has verbalized understanding is amenable to undergoing the procedure.  I personally performed the service.  Management of other medical comorbidities as per primary team  Thank you for allowing [place] to participate in this patient's care. Please don't hesitate to call if any questions or concerns arise.   Jaynie Collins, DO [facility]  Portions of the record may have been created with voice recognition software. Occasional wrong-word or 'sound-a-like' substitutions may have occurred due to the inherent limitations of voice recognition software.  Read the chart carefully and recognize, using context, where substitutions may have occurred.

## 2022-08-22 NOTE — Progress Notes (Signed)
PROGRESS NOTE    Paul Bachtell [name]  WGN:[id number] DOB: [birth year] DOA: 08/20/2022 PCP: Wilford Corner, PA-C    Brief Narrative:  77 year old male with history of CLL, iron deficiency anemia, CKD 3A, hypertension, chronic low back pain, hyperlipidemia, non-insulin-dependent diabetes mellitus, who presents emergency department from cancer center for chief concerns of low hemoglobin at 6.4.   His daughter is concerned that the reason why his hemoglobin was so low today, is because he missed the iron transfusion last week.  He came in today to outpatient cancer center and received his IV transfusion on 7/26 prior to ED presentation.  He was ultimately sent to the emergency department as he had regular labs done and showed hemoglobin low at 6.4.  7/27: Status post EGD with GI.  Unremarkable.  Plan for colonoscopy 7/28 7/28: Hb 8.2 this AM.  Plan for c-scope     Assessment & Plan:   Principal Problem:   Acute on chronic anemia Active Problems:   Essential hypertension   Type II diabetes mellitus with renal manifestations (HCC)   BPH with obstruction/lower urinary tract symptoms   HLD (hyperlipidemia)   Hypothyroidism   Chronic back pain   OSA on CPAP   CLL (chronic lymphocytic leukemia) (HCC)   IDA (iron deficiency anemia)   Acute on chronic anemia Differentials include GI bleed versus CLL versus anemia chronic kidney disease versus iron deficiency anemia Status post EGD 7/27, unremarkable Plan: N.p.o. for colonoscopy today as stools are unclear. Continue p.o. PPI twice daily  Essential hypertension PTA irbesartan PTA metoprolol PTA hydralazine   Type II diabetes mellitus with renal manifestations (HCC) Non-insulin-dependent diabetes mellitus Hold home metformin Moderate sliding scale with bedtime coverage   HLD (hyperlipidemia) PTA statin   Hypothyroidism PTA Synthroid   IDA (iron deficiency anemia) Does not meet criteria for iron transfusion at  this time   CLL (chronic lymphocytic leukemia) (HCC) Holding home Zanubrutinib while inpatient Will discuss with oncology 7/29 regarding timing of restart   OSA on CPAP CPAP nightly ordered   Chronic back pain PTA Percocet   DVT prophylaxis: SCD Code Status: Full Family Communication: Wife at bedside 7/27, 7/28 Disposition Plan: Status is: Inpatient Remains inpatient appropriate because: Acute on chronic anemia   Level of care: Telemetry Cardiac  Consultants:  GI  Procedures:  EGD 7/27  Antimicrobials: None   Subjective: Seen and examined.  Tolerated the prep overnight.  N.p.o. for colonoscopy today.  Objective: Vitals:   08/21/22 1953 08/22/22 0008 08/22/22 0448 08/22/22 0817  BP: (!) 170/78 (!) 148/69 (!) 149/74 (!) 160/70  Pulse: 61 (!) 57 (!) 53 (!) 49  Resp: 17 14 14 18   Temp: 98.1 F (36.7 C) 98.7 F (37.1 C) 98.1 F (36.7 C) 97.8 F (36.6 C)  TempSrc:      SpO2: 100% 100% 100% 96%  Weight:      Height:        Intake/Output Summary (Last 24 hours) at 08/22/2022 1011 Last data filed at 08/21/2022 1620 Gross per 24 hour  Intake 581.25 ml  Output 360 ml  Net 221.25 ml   Filed Weights   08/20/22 1348  Weight: 107.5 kg    Examination:  General exam: No acute distress Respiratory system: Lungs clear.  Normal work of breathing.  Room air Cardiovascular system: S1-S2, RRR, no murmurs, no pedal edema Gastrointestinal system: Soft, NT/ND, normal bowel sounds Central nervous system: Alert and oriented. No focal neurological deficits. Extremities: Symmetric 5 x 5 power. Skin: No  rashes, lesions or ulcers Psychiatry: Judgement and insight appear normal. Mood & affect appropriate.     Data Reviewed: I have personally reviewed following labs and imaging studies  CBC: Recent Labs  Lab 08/20/22 1231 08/20/22 1231 08/20/22 1350 08/20/22 2210 08/21/22 0216 08/21/22 1220 08/22/22 0052 08/22/22 0412  WBC 7.0  --  6.8  --  6.4  --   --  7.4   NEUTROABS 2.5  --   --   --   --   --   --   --   HGB 6.4*   < > 6.1* 5.7* 6.4* 7.8* 8.3* 8.2*  HCT 20.9*  --  19.9* 18.6* 20.5* 25.2* 26.4* 25.8*  MCV 91.3  --  89.6  --  90.7  --   --  88.4  PLT 150  --  155  --  147*  --   --  143*   < > = values in this interval not displayed.   Basic Metabolic Panel: Recent Labs  Lab 08/20/22 1350 08/21/22 0216 08/22/22 0412  NA 139 137 141  K 4.1 3.2* 3.7  CL 107 110 112*  CO2 24 20* 23  GLUCOSE 115* 118* 92  BUN 16 14 9   CREATININE 1.35* 1.13 0.99  CALCIUM 8.0* 7.7* 7.9*   GFR: Estimated Creatinine Clearance: 81.6 mL/min (by C-G formula based on SCr of 0.99 mg/dL). Liver Function Tests: Recent Labs  Lab 08/20/22 1350  AST 61*  ALT 23  ALKPHOS 44  BILITOT 1.0  PROT 6.1*  ALBUMIN 3.4*   No results for input(s): "LIPASE", "AMYLASE" in the last 168 hours. No results for input(s): "AMMONIA" in the last 168 hours. Coagulation Profile: No results for input(s): "INR", "PROTIME" in the last 168 hours. Cardiac Enzymes: No results for input(s): "CKTOTAL", "CKMB", "CKMBINDEX", "TROPONINI" in the last 168 hours. BNP (last 3 results) No results for input(s): "PROBNP" in the last 8760 hours. HbA1C: No results for input(s): "HGBA1C" in the last 72 hours. CBG: Recent Labs  Lab 08/21/22 0843 08/21/22 1243 08/21/22 1624 08/21/22 2149 08/22/22 0813  GLUCAP 108* 82 92 107* 88   Lipid Profile: No results for input(s): "CHOL", "HDL", "LDLCALC", "TRIG", "CHOLHDL", "LDLDIRECT" in the last 72 hours. Thyroid Function Tests: No results for input(s): "TSH", "T4TOTAL", "FREET4", "T3FREE", "THYROIDAB" in the last 72 hours. Anemia Panel: Recent Labs    08/20/22 1231 08/20/22 1350 08/20/22 2210  VITAMINB12  --   --  270  FOLATE  --  24.0  --   FERRITIN  --  12*  --   TIBC  --  295  --   IRON  --  132  --   RETICCTPCT 2.2 2.3  --    Sepsis Labs: No results for input(s): "PROCALCITON", "LATICACIDVEN" in the last 168 hours.  No results  found for this or any previous visit (from the past 240 hour(s)).       Radiology Studies: No results found.      Scheduled Meds:  sodium chloride   Intravenous Once   hydrALAZINE  50 mg Oral BID   insulin aspart  0-5 Units Subcutaneous QHS   insulin aspart  0-9 Units Subcutaneous TID WC   irbesartan  150 mg Oral Daily   levothyroxine  200 mcg Oral QAC breakfast   metoprolol succinate  25 mg Oral Daily   pantoprazole  40 mg Oral BID AC   pregabalin  100 mg Oral BID   rosuvastatin  40 mg Oral QHS  tamsulosin  0.4 mg Oral Daily   Continuous Infusions:   LOS: 2 days       Tresa Moore, MD [facility]   If 7PM-7AM, please contact night-coverage  08/22/2022, 10:11 AM

## 2022-08-22 NOTE — Anesthesia Postprocedure Evaluation (Signed)
Anesthesia Post Note  Patient: Paul Zakowski [name]  Procedure(s) Performed: ESOPHAGOGASTRODUODENOSCOPY (EGD) WITH PROPOFOL BIOPSY  Patient location during evaluation: Endoscopy Anesthesia Type: General Level of consciousness: awake and alert Pain management: pain level controlled Vital Signs Assessment: post-procedure vital signs reviewed and stable Respiratory status: spontaneous breathing, nonlabored ventilation, respiratory function stable and patient connected to nasal cannula oxygen Cardiovascular status: blood pressure returned to baseline and stable Postop Assessment: no apparent nausea or vomiting Anesthetic complications: no   No notable events documented.   Last Vitals:  Vitals:   08/21/22 1953 08/22/22 0008  BP: (!) 170/78 (!) 148/69  Pulse: 61 (!) 57  Resp: 17 14  Temp: 36.7 C 37.1 C  SpO2: 100% 100%    Last Pain:  Vitals:   08/21/22 1953  TempSrc:   PainSc: 0-No pain                 Lenard Simmer

## 2022-08-22 NOTE — Plan of Care (Signed)
  Problem: Coping: Goal: Ability to adjust to condition or change in health will improve Outcome: Progressing   Problem: Fluid Volume: Goal: Ability to maintain a balanced intake and output will improve Outcome: Progressing   Problem: Health Behavior/Discharge Planning: Goal: Ability to identify and utilize available resources and services will improve Outcome: Progressing   

## 2022-08-22 NOTE — Progress Notes (Addendum)
Inpatient Follow-up/Progress Note   Patient ID: Paul Scobey [name] is a 77 y.o. male.  Overnight Events / Subjective Findings NAEON.  Hemoglobin stable.  Vital signs stable.  He notes his stools have lightened after completing his prep, but not clear/see-through and still black at this time.  He denies nausea vomiting abdominal pain. Gastritis found on EGD yesterday with small erosions which is likely contributory source to his dark stools and anemia.  Biopsies pending No other acute GI complaints  Review of Systems  Constitutional:  Positive for fatigue. Negative for activity change, appetite change, chills, diaphoresis, fever and unexpected weight change.  HENT:  Negative for trouble swallowing and voice change.   Respiratory:  Positive for shortness of breath. Negative for wheezing.   Cardiovascular:  Negative for chest pain, palpitations and leg swelling.  Gastrointestinal:  Positive for blood in stool (melena). Negative for abdominal distention, abdominal pain, anal bleeding, constipation, diarrhea, nausea and vomiting.  Musculoskeletal:  Negative for arthralgias and myalgias.  Skin:  Negative for color change and pallor.  Neurological:  Positive for weakness. Negative for dizziness and syncope.  Psychiatric/Behavioral:  Negative for confusion. The patient is not nervous/anxious.   All other systems reviewed and are negative.    Medications  Current Facility-Administered Medications:    0.9 %  sodium chloride infusion (Manually program via Guardrails IV Fluids), , Intravenous, Once, Manuela Schwartz, NP   acetaminophen (TYLENOL) tablet 650 mg, 650 mg, Oral, Q6H PRN **OR** acetaminophen (TYLENOL) suppository 650 mg, 650 mg, Rectal, Q6H PRN, Cox, Amy N, DO   hydrALAZINE (APRESOLINE) injection 5 mg, 5 mg, Intravenous, Q8H PRN, Cox, Amy N, DO   hydrALAZINE (APRESOLINE) tablet 50 mg, 50 mg, Oral, BID, Cox, Amy N, DO, 50 mg at 08/22/22 0933   insulin aspart (novoLOG) injection  0-5 Units, 0-5 Units, Subcutaneous, QHS, Cox, Amy N, DO   insulin aspart (novoLOG) injection 0-9 Units, 0-9 Units, Subcutaneous, TID WC, Cox, Amy N, DO   irbesartan (AVAPRO) tablet 150 mg, 150 mg, Oral, Daily, Cox, Amy N, DO, 150 mg at 08/22/22 0930   levothyroxine (SYNTHROID) tablet 200 mcg, 200 mcg, Oral, QAC breakfast, Cox, Amy N, DO, 200 mcg at 08/21/22 0738   melatonin tablet 5 mg, 5 mg, Oral, QHS PRN, Cox, Amy N, DO, 5 mg at 08/20/22 2239   metoprolol succinate (TOPROL-XL) 24 hr tablet 25 mg, 25 mg, Oral, Daily, Cox, Amy N, DO, 25 mg at 08/22/22 0931   ondansetron (ZOFRAN) tablet 4 mg, 4 mg, Oral, Q6H PRN **OR** ondansetron (ZOFRAN) injection 4 mg, 4 mg, Intravenous, Q6H PRN, Cox, Amy N, DO, 4 mg at 08/21/22 2122   oxyCODONE-acetaminophen (PERCOCET/ROXICET) 5-325 MG per tablet 1 tablet, 1 tablet, Oral, Q8H PRN, 1 tablet at 08/21/22 0739 **AND** oxyCODONE (Oxy IR/ROXICODONE) immediate release tablet 5 mg, 5 mg, Oral, Q8H PRN, Cox, Amy N, DO, 5 mg at 08/21/22 0739   pantoprazole (PROTONIX) EC tablet 40 mg, 40 mg, Oral, BID AC, Sreenath, Sudheer B, MD, 40 mg at 08/22/22 0930   polyethylene glycol-electrolytes (NuLYTELY) solution 2,000 mL, 2,000 mL, Oral, Once PRN, Jaynie Collins, DO   pregabalin (LYRICA) capsule 100 mg, 100 mg, Oral, BID, Cox, Amy N, DO, 100 mg at 08/22/22 0930   rosuvastatin (CRESTOR) tablet 40 mg, 40 mg, Oral, QHS, Cox, Amy N, DO   senna-docusate (Senokot-S) tablet 1 tablet, 1 tablet, Oral, QHS PRN, Cox, Amy N, DO   tamsulosin (FLOMAX) capsule 0.4 mg, 0.4 mg, Oral, Daily, Sreenath, [employer]  B, MD, 0.4 mg at 08/22/22 0931   acetaminophen **OR** acetaminophen, hydrALAZINE, melatonin, ondansetron **OR** ondansetron (ZOFRAN) IV, oxyCODONE-acetaminophen **AND** oxyCODONE, polyethylene glycol-electrolytes, senna-docusate   Objective    Vitals:   08/21/22 1953 08/22/22 0008 08/22/22 0448 08/22/22 0817  BP: (!) 170/78 (!) 148/69 (!) 149/74 (!) 160/70  Pulse: 61 (!) 57 (!)  53 (!) 49  Resp: 17 14 14 18   Temp: 98.1 F (36.7 C) 98.7 F (37.1 C) 98.1 F (36.7 C) 97.8 F (36.6 C)  TempSrc:      SpO2: 100% 100% 100% 96%  Weight:      Height:         Physical Exam Vitals and nursing note reviewed.  Constitutional:      General: He is not in acute distress.    Appearance: He is ill-appearing (chronically). He is not toxic-appearing or diaphoretic.  HENT:     Head: Normocephalic and atraumatic.     Nose: Nose normal.     Mouth/Throat:     Mouth: Mucous membranes are moist.     Pharynx: Oropharynx is clear.  Eyes:     General: No scleral icterus.    Extraocular Movements: Extraocular movements intact.  Cardiovascular:     Rate and Rhythm: Regular rhythm. Bradycardia present.     Heart sounds: Murmur heard.  Pulmonary:     Effort: Pulmonary effort is normal. No respiratory distress.     Breath sounds: Normal breath sounds. No wheezing, rhonchi or rales.  Abdominal:     General: Abdomen is flat. Bowel sounds are normal. There is no distension.     Palpations: Abdomen is soft.     Tenderness: There is no abdominal tenderness. There is no guarding or rebound.  Musculoskeletal:     Cervical back: Neck supple.     Right lower leg: No edema.     Left lower leg: No edema.  Skin:    General: Skin is warm and dry.     Coloration: Skin is not jaundiced or pale.  Neurological:     General: No focal deficit present.     Mental Status: He is alert and oriented to person, place, and time. Mental status is at baseline.  Psychiatric:        Mood and Affect: Mood normal.        Behavior: Behavior normal.        Thought Content: Thought content normal.        Judgment: Judgment normal.      Laboratory Data Recent Labs  Lab 08/20/22 1231 08/20/22 1350 08/20/22 2210 08/21/22 0216 08/21/22 1220 08/22/22 0052 08/22/22 0412  WBC 7.0 6.8  --  6.4  --   --  7.4  HGB 6.4* 6.1*   < > 6.4* 7.8* 8.3* 8.2*  HCT 20.9* 19.9*   < > 20.5* 25.2* 26.4* 25.8*   PLT 150 155  --  147*  --   --  143*  NEUTOPHILPCT 36  --   --   --   --   --   --   LYMPHOPCT 55  --   --   --   --   --   --   MONOPCT 6  --   --   --   --   --   --   EOSPCT 2  --   --   --   --   --   --    < > = values in this interval not displayed.  Recent Labs  Lab 08/20/22 1350 08/21/22 0216 08/22/22 0412  NA 139 137 141  K 4.1 3.2* 3.7  CL 107 110 112*  CO2 24 20* 23  BUN 16 14 9   CREATININE 1.35* 1.13 0.99  CALCIUM 8.0* 7.7* 7.9*  PROT 6.1*  --   --   BILITOT 1.0  --   --   ALKPHOS 44  --   --   ALT 23  --   --   AST 61*  --   --   GLUCOSE 115* 118* 92   No results for input(s): "INR" in the last 168 hours.    Imaging Studies: No results found.  Assessment:   # Acute on chronic symptomatic anemia - EGD 08/21/22- gastritis with erosions- likely contributing to anemia/melena - plan for CSY today if prep ok - hgb has stabilized (8.2). VSS.  - pt reports weakness, DOE - h/o DOE with chronic anemia, as well as CKD and iron deficiency - currently iron deficient based on labs - has rcd 2 u prbc thus far. - baseline 7.4-8.4 over last 2 months   # Reported melena   # nsaid dependence   # Elevated AST - other liver values wnl. Could also be from muscle as well. Pt denies drinking   # CLL - on Zanubrutinib    # CKD   # HTN, HLD, DM2, OSA on CPAP, hypothyroid, chronic back pain  Plan:  Pt completed prep o/n, however, stools not clear/see-through- he indicates they are still black/tarry but lightened from previous Past colonoscopy reports have indicated that he is difficult to prep and would require two day prep Given that we are looking for bleeding source, would recommend continued prep and postponing colonoscopy to tomorrow as he is stable from a hemoglobin and vital signs perspective Resume clear liquids now.  Npo at midnight Hgb and vss Potential VCE if colonoscopy negative Continue protonix 40 mg po q12h Labs this morning reviewed by  me  Monitor H&H.  Transfusion and resuscitation as per primary team Avoid frequent lab draws to prevent lab induced anemia Supportive care and antiemetics as per primary team Maintain two sites IV access Avoid nsaids Monitor for GIB.   Colonoscopy and video capsule endoscopy with possible biopsy, control of bleeding, polypectomy, and interventions as necessary has been discussed with the patient/patient representative. Informed consent was obtained from the patient/patient representative after explaining the indication, nature, and risks of the procedure including but not limited to death, bleeding, perforation, missed neoplasm/lesions, cardiorespiratory compromise, and reaction to medications. Opportunity for questions was given and appropriate answers were provided. Patient/patient representative has verbalized understanding is amenable to undergoing the procedure.  I personally performed the service.  Management of other medical comorbidities as per primary team  Thank you for allowing [place] to participate in this patient's care. Please don't hesitate to call if any questions or concerns arise.   Jaynie Collins, DO [facility]  Portions of the record may have been created with voice recognition software. Occasional wrong-word or 'sound-a-like' substitutions may have occurred due to the inherent limitations of voice recognition software.  Read the chart carefully and recognize, using context, where substitutions may have occurred.

## 2022-08-22 NOTE — Plan of Care (Signed)
  Problem: Fluid Volume: Goal: Ability to maintain a balanced intake and output will improve Outcome: Progressing   Problem: Tissue Perfusion: Goal: Adequacy of tissue perfusion will improve Outcome: Progressing   Problem: Clinical Measurements: Goal: Ability to maintain clinical measurements within normal limits will improve Outcome: Progressing   Problem: Clinical Measurements: Goal: Respiratory complications will improve Outcome: Progressing   Problem: Activity: Goal: Risk for activity intolerance will decrease Outcome: Progressing   Problem: Pain Managment: Goal: General experience of comfort will improve Outcome: Progressing   Problem: Safety: Goal: Ability to remain free from injury will improve Outcome: Progressing

## 2022-08-23 ENCOUNTER — Encounter: Payer: Self-pay | Admitting: Internal Medicine

## 2022-08-23 ENCOUNTER — Encounter
Admission: EM | Disposition: A | Discharge status: Home / Self Care | Payer: Self-pay | Source: Home / Self Care | Attending: Internal Medicine

## 2022-08-23 ENCOUNTER — Inpatient Hospital Stay: Payer: No Typology Code available for payment source | Admitting: Anesthesiology

## 2022-08-23 DIAGNOSIS — E1122 Type 2 diabetes mellitus with diabetic chronic kidney disease: Secondary | ICD-10-CM | POA: Diagnosis not present

## 2022-08-23 DIAGNOSIS — I129 Hypertensive chronic kidney disease with stage 1 through stage 4 chronic kidney disease, or unspecified chronic kidney disease: Secondary | ICD-10-CM | POA: Diagnosis not present

## 2022-08-23 DIAGNOSIS — K6389 Other specified diseases of intestine: Secondary | ICD-10-CM

## 2022-08-23 DIAGNOSIS — N1831 Chronic kidney disease, stage 3a: Secondary | ICD-10-CM | POA: Diagnosis not present

## 2022-08-23 DIAGNOSIS — K649 Unspecified hemorrhoids: Secondary | ICD-10-CM | POA: Diagnosis not present

## 2022-08-23 DIAGNOSIS — D649 Anemia, unspecified: Secondary | ICD-10-CM | POA: Diagnosis not present

## 2022-08-23 DIAGNOSIS — K5730 Diverticulosis of large intestine without perforation or abscess without bleeding: Secondary | ICD-10-CM | POA: Diagnosis not present

## 2022-08-23 DIAGNOSIS — K635 Polyp of colon: Secondary | ICD-10-CM | POA: Diagnosis not present

## 2022-08-23 DIAGNOSIS — D631 Anemia in chronic kidney disease: Secondary | ICD-10-CM | POA: Diagnosis not present

## 2022-08-23 HISTORY — PX: GIVENS CAPSULE STUDY: SHX5432

## 2022-08-23 HISTORY — PX: COLONOSCOPY WITH PROPOFOL: SHX5780

## 2022-08-23 HISTORY — PX: POLYPECTOMY: SHX5525

## 2022-08-23 HISTORY — PX: SUBMUCOSAL TATTOO INJECTION: SHX6856

## 2022-08-23 HISTORY — DX: Other specified diseases of intestine: K63.89

## 2022-08-23 HISTORY — PX: BIOPSY: SHX5522

## 2022-08-23 LAB — GLUCOSE, CAPILLARY
Glucose-Capillary: 84 mg/dL (ref 70–99)
Glucose-Capillary: 96 mg/dL (ref 70–99)
Glucose-Capillary: 172 mg/dL — ABNORMAL HIGH (ref 70–99)
Glucose-Capillary: 152 mg/dL — ABNORMAL HIGH (ref 70–99)

## 2022-08-23 SURGERY — COLONOSCOPY WITH PROPOFOL
Anesthesia: General

## 2022-08-23 MED ORDER — LIDOCAINE 2% (20 MG/ML) 5 ML SYRINGE
INTRAMUSCULAR | Status: DC | PRN
  Administered 2022-08-23: 100 mg via INTRAVENOUS

## 2022-08-23 MED ORDER — SODIUM CHLORIDE 0.9 % IV SOLN: INTRAVENOUS | Status: DC | PRN

## 2022-08-23 MED ORDER — GUAIFENESIN-DM 100-10 MG/5ML PO SYRP
5.0000 mL | ORAL_SOLUTION | ORAL | Status: DC | PRN
  Administered 2022-08-23: 5 mL via ORAL
  Filled 2022-08-23: qty 10

## 2022-08-23 MED ORDER — HYDRALAZINE HCL 20 MG/ML IJ SOLN: 10.0000 mg | INTRAMUSCULAR | Status: DC | PRN

## 2022-08-23 MED ORDER — PHENYLEPHRINE HCL (PRESSORS) 10 MG/ML IV SOLN
INTRAVENOUS | Status: DC | PRN
  Administered 2022-08-23 (×3): 80 ug via INTRAVENOUS

## 2022-08-23 MED ORDER — PROPOFOL 500 MG/50ML IV EMUL
INTRAVENOUS | Status: DC | PRN
  Administered 2022-08-23: 150 ug/kg/min via INTRAVENOUS

## 2022-08-23 MED ORDER — PROPOFOL 10 MG/ML IV BOLUS
INTRAVENOUS | Status: DC | PRN
Start: 2022-08-23 — End: 2022-08-23
  Administered 2022-08-23: 80 mg via INTRAVENOUS

## 2022-08-23 NOTE — Anesthesia Preprocedure Evaluation (Addendum)
Anesthesia Evaluation  Patient identified by MRN, date of birth, ID band Patient awake    Reviewed: Allergy & Precautions, NPO status , Patient's Chart, lab work & pertinent test results  History of Anesthesia Complications (+) DIFFICULT AIRWAY and history of anesthetic complications  Airway Mallampati: II  TM Distance: >3 FB Neck ROM: full    Dental  (+) Missing, Poor Dentition, Chipped, Dental Advisory Given   Pulmonary sleep apnea , former smoker   Pulmonary exam normal breath sounds clear to auscultation       Cardiovascular Exercise Tolerance: Poor hypertension, Pt. on medications + DOE  Normal cardiovascular exam Rhythm:Regular Rate:Normal     Neuro/Psych  Headaches negative neurological ROS  negative psych ROS   GI/Hepatic negative GI ROS, Neg liver ROS,,,  Endo/Other  diabetes, Type 2, Oral Hypoglycemic AgentsHypothyroidism    Renal/GU CRFRenal diseasenegative Renal ROS  negative genitourinary   Musculoskeletal   Abdominal  (+) + obese  Peds negative pediatric ROS (+)  Hematology  (+) Blood dyscrasia, anemia   Anesthesia Other Findings Past Medical History: No date: Acute prostatitis No date: BPH (benign prostatic hyperplasia) No date: Chronic kidney disease No date: CLL (chronic lymphocytic leukemia) (HCC) No date: Diabetes (HCC) No date: Dysplasia of prostate No date: Erectile dysfunction No date: HTN (hypertension) No date: Hypogonadism in male No date: Orchitis and epididymitis No date: Over weight 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, hand No date: Prostatitis No date: Rectum pain No date: Testicle swelling No date: Testicle tenderness No date: Testicular mass  Past Surgical History: No date: BACK SURGERY 08/21/2022: BIOPSY     Comment:  Procedure: BIOPSY;  Surgeon: Jaynie Collins, DO;               Location: [facility] ENDOSCOPY;  Service: Gastroenterology;; 08/21/2022:  ESOPHAGOGASTRODUODENOSCOPY (EGD) WITH PROPOFOL; N/A     Comment:  Procedure: ESOPHAGOGASTRODUODENOSCOPY (EGD) WITH               PROPOFOL;  Surgeon: Jaynie Collins, DO;  Location:              [facility] ENDOSCOPY;  Service: Gastroenterology;  Laterality:               N/A; No date: head surgery     Comment:  from fall  drilled hole in brain to relieve pressure No date: TONSILLECTOMY  BMI    Body Mass Index: 31.27 kg/m  Intubation note 09/2015 First attempt done with the glidescope however due to severely large tongue this attempt was unsuccessful. Second attempt was performed with Mac 4 and direct laryngoscopy with a 8.0 tube which was too big and would not pass through the cords. Third successful attempt was done with a Mac 4 and a 7.5 tube       Reproductive/Obstetrics negative OB ROS                              Anesthesia Physical Anesthesia Plan  ASA: 3  Anesthesia Plan: General   Post-op Pain Management:    Induction: Intravenous  PONV Risk Score and Plan: Propofol infusion and TIVA  Airway Management Planned: Natural Airway  Additional Equipment:   Intra-op Plan:   Post-operative Plan:   Informed Consent: I have reviewed the patients History and Physical, chart, labs and discussed the procedure including the risks, benefits and alternatives for the proposed anesthesia with the patient or authorized representative who has indicated his/her understanding and acceptance.  Dental Advisory Given  Plan Discussed with: CRNA and Surgeon  Anesthesia Plan Comments:         Anesthesia Quick Evaluation

## 2022-08-23 NOTE — Plan of Care (Signed)

## 2022-08-23 NOTE — Transfer of Care (Signed)
Immediate Anesthesia Transfer of Care Note  Patient: Paul Sukhram [name]  Procedure(s) Performed: COLONOSCOPY WITH PROPOFOL GIVENS CAPSULE STUDY POLYPECTOMY SUBMUCOSAL TATTOO INJECTION  Patient Location: PACU and Endoscopy Unit  Anesthesia Type:General  Level of Consciousness: sedated  Airway & Oxygen Therapy: Patient Spontanous Breathing  Post-op Assessment: Report given to RN and Post -op Vital signs reviewed and stable  Post vital signs: Reviewed and stable  Last Vitals:  Vitals Value Taken Time  BP 106/67 08/23/22 1425  Temp    Pulse 71 08/23/22 1426  Resp 15 08/23/22 1426  SpO2 94 % 08/23/22 1426  Vitals shown include unfiled device data.  Last Pain:  Vitals:   08/23/22 1322  TempSrc: Temporal  PainSc: 0-No pain         Complications: No notable events documented.

## 2022-08-23 NOTE — Anesthesia Procedure Notes (Signed)
Procedure Name: MAC Date/Time: 08/23/2022 1:35 PM  Performed by: Cheral Bay, CRNAPre-anesthesia Checklist: Patient identified, Emergency Drugs available, Suction available, Patient being monitored and Timeout performed Patient Re-evaluated:Patient Re-evaluated prior to induction Oxygen Delivery Method: Nasal cannula Induction Type: IV induction Placement Confirmation: positive ETCO2 and CO2 detector

## 2022-08-23 NOTE — Progress Notes (Signed)
PROGRESS NOTE    Paul Curren [name]  UJW:[id number] DOB: August 23, 1945 DOA: 08/20/2022 PCP: Wilford Corner, PA-C    Brief Narrative:  77 year old male with history of CLL, iron deficiency anemia, CKD 3A, hypertension, chronic low back pain, hyperlipidemia, non-insulin-dependent diabetes mellitus, who presents emergency department from cancer center for chief concerns of low hemoglobin at 6.4.   His daughter is concerned that the reason why his hemoglobin was so low today, is because he missed the iron transfusion last week.  He came in today to outpatient cancer center and received his IV transfusion on 7/26 prior to ED presentation.  He was ultimately sent to the emergency department as he had regular labs done and showed hemoglobin low at 6.4.  7/27: Status post EGD with GI.  Unremarkable.  Plan for colonoscopy 7/28 7/28: Hb 8.2 this AM.  Plan for c-scope 7/29: Patient did not have clear stools yesterday despite completing prep.  Repeat prep.  Hemoglobin 8.7.  Stools now running clear.  Plan for colonoscopy and possible VCE today     Assessment & Plan:   Principal Problem:   Acute on chronic anemia Active Problems:   Essential hypertension   Type II diabetes mellitus with renal manifestations (HCC)   BPH with obstruction/lower urinary tract symptoms   HLD (hyperlipidemia)   Hypothyroidism   Chronic back pain   OSA on CPAP   CLL (chronic lymphocytic leukemia) (HCC)   IDA (iron deficiency anemia)   Acute on chronic anemia Differentials include GI bleed versus CLL versus anemia chronic kidney disease versus iron deficiency anemia Status post EGD 7/27, unremarkable Plan: C-scope today P.o. PPI twice daily No indication for transfusion Possible VCE  Essential hypertension PTA irbesartan PTA metoprolol PTA hydralazine   Type II diabetes mellitus with renal manifestations (HCC) Non-insulin-dependent diabetes mellitus Hold home metformin Moderate sliding scale  with bedtime coverage   HLD (hyperlipidemia) PTA statin   Hypothyroidism PTA Synthroid   IDA (iron deficiency anemia) Does not meet criteria for iron transfusion at this time   CLL (chronic lymphocytic leukemia) (HCC) Holding home Zanubrutinib while inpatient Will await results of colonoscopy before deciding on timing of restart   OSA on CPAP CPAP nightly ordered   Chronic back pain PTA Percocet   DVT prophylaxis: SCD Code Status: Full Family Communication: Wife at bedside 7/27, 7/28 Disposition Plan: Status is: Inpatient Remains inpatient appropriate because: Acute on chronic anemia   Level of care: Telemetry Cardiac  Consultants:  GI  Procedures:  EGD 7/27 Colonoscopy 7/29  Antimicrobials: None   Subjective: Seen and examined.  Tolerated repeat prep.  N.p.o. for colonoscopy today.  Objective: Vitals:   08/22/22 2319 08/23/22 0423 08/23/22 0621 08/23/22 0758  BP: (!) 160/87 (!) 188/80 (!) 176/90 (!) 172/63  Pulse: (!) 56 (!) 51 60 (!) 57  Resp: 20 20  (!) 21  Temp: 98.5 F (36.9 C) 97.8 F (36.6 C)  98.2 F (36.8 C)  TempSrc: Oral Oral    SpO2: 100% 100%  98%  Weight:      Height:       No intake or output data in the 24 hours ending 08/23/22 1106  Filed Weights   08/20/22 1348  Weight: 107.5 kg    Examination:  General exam: NAD.  Lying in bed Respiratory system: Normal respiratory effort.  Room air Cardiovascular system: S1-S2, RRR, no murmurs, no pedal edema Gastrointestinal system: Soft, NT/ND, normal bowel sounds Central nervous system: Alert and oriented. No focal neurological deficits.  Extremities: Symmetric 5 x 5 power. Skin: No rashes, lesions or ulcers Psychiatry: Judgement and insight appear normal. Mood & affect appropriate.     Data Reviewed: I have personally reviewed following labs and imaging studies  CBC: Recent Labs  Lab 08/20/22 1231 08/20/22 1350 08/20/22 2210 08/21/22 0216 08/21/22 1220 08/22/22 0052  08/22/22 0412 08/23/22 0502  WBC 7.0 6.8  --  6.4  --   --  7.4 7.7  NEUTROABS 2.5  --   --   --   --   --   --   --   HGB 6.4* 6.1*   < > 6.4* 7.8* 8.3* 8.2* 8.7*  HCT 20.9* 19.9*   < > 20.5* 25.2* 26.4* 25.8* 27.3*  MCV 91.3 89.6  --  90.7  --   --  88.4 90.1  PLT 150 155  --  147*  --   --  143* 151   < > = values in this interval not displayed.   Basic Metabolic Panel: Recent Labs  Lab 08/20/22 1350 08/21/22 0216 08/22/22 0412 08/23/22 0502  NA 139 137 141 142  K 4.1 3.2* 3.7 4.1  CL 107 110 112* 111  CO2 24 20* 23 24  GLUCOSE 115* 118* 92 103*  BUN 16 14 9 8   CREATININE 1.35* 1.13 0.99 1.10  CALCIUM 8.0* 7.7* 7.9* 8.2*   GFR: Estimated Creatinine Clearance: 73.5 mL/min (by C-G formula based on SCr of 1.1 mg/dL). Liver Function Tests: Recent Labs  Lab 08/20/22 1350  AST 61*  ALT 23  ALKPHOS 44  BILITOT 1.0  PROT 6.1*  ALBUMIN 3.4*   No results for input(s): "LIPASE", "AMYLASE" in the last 168 hours. No results for input(s): "AMMONIA" in the last 168 hours. Coagulation Profile: No results for input(s): "INR", "PROTIME" in the last 168 hours. Cardiac Enzymes: No results for input(s): "CKTOTAL", "CKMB", "CKMBINDEX", "TROPONINI" in the last 168 hours. BNP (last 3 results) No results for input(s): "PROBNP" in the last 8760 hours. HbA1C: Recent Labs    08/21/22 0215  HGBA1C 6.4*   CBG: Recent Labs  Lab 08/22/22 1248 08/22/22 1737 08/22/22 2104 08/22/22 2218 08/23/22 0758  GLUCAP 83 174* 68* 103* 84   Lipid Profile: No results for input(s): "CHOL", "HDL", "LDLCALC", "TRIG", "CHOLHDL", "LDLDIRECT" in the last 72 hours. Thyroid Function Tests: No results for input(s): "TSH", "T4TOTAL", "FREET4", "T3FREE", "THYROIDAB" in the last 72 hours. Anemia Panel: Recent Labs    08/20/22 1231 08/20/22 1350 08/20/22 2210  VITAMINB12  --   --  270  FOLATE  --  24.0  --   FERRITIN  --  12*  --   TIBC  --  295  --   IRON  --  132  --   RETICCTPCT 2.2 2.3  --     Sepsis Labs: No results for input(s): "PROCALCITON", "LATICACIDVEN" in the last 168 hours.  No results found for this or any previous visit (from the past 240 hour(s)).       Radiology Studies: No results found.      Scheduled Meds:  sodium chloride   Intravenous Once   hydrALAZINE  50 mg Oral BID   insulin aspart  0-5 Units Subcutaneous QHS   insulin aspart  0-9 Units Subcutaneous TID WC   irbesartan  150 mg Oral Daily   levothyroxine  200 mcg Oral QAC breakfast   metoprolol succinate  25 mg Oral Daily   pantoprazole  40 mg Oral BID AC   pregabalin  100 mg Oral BID   rosuvastatin  40 mg Oral QHS   tamsulosin  0.4 mg Oral Daily   Continuous Infusions:   LOS: 3 days       Tresa Moore, MD [facility]   If 7PM-7AM, please contact night-coverage  08/23/2022, 11:06 AM

## 2022-08-23 NOTE — TOC Progression Note (Signed)
Transition of Care (TOC) - Progression Note    Patient Details  Name: Paul Lemmo [name] MRN: [id number] Date of Birth: Nov 19, 1945  Transition of Care [facility]) CM/SW Contact  Truddie Hidden, RN Phone Number: 08/23/2022, 3:57 PM  Clinical Narrative:    TOC assessing for ongoing needs and discharge planning.        Expected Discharge Plan and Services In-house Referral: [facility] (Currently followed, last contact by phone 08/06/22 re RX refill.)                                             Social Determinants of Health (SDOH) Interventions SDOH Screenings   Food Insecurity: Food Insecurity Present (07/26/2022)  Housing: Low Risk  (07/26/2022)  Transportation Needs: No Transportation Needs (07/26/2022)  Utilities: Not At Risk (07/01/2022)   [facility] from [facility]  Alcohol Screen: Low Risk  (01/22/2022)  Depression (PHQ2-9): Low Risk  (08/19/2022)  Financial Resource Strain: Medium Risk (07/26/2022)  Physical Activity: Unknown (07/26/2022)  Social Connections: Moderately Integrated (07/26/2022)  Stress: Stress Concern Present (07/26/2022)  Tobacco Use: Medium Risk (08/23/2022)    Readmission Risk Interventions     No data to display

## 2022-08-23 NOTE — Interval H&P Note (Signed)
History and Physical Interval Note: Progress note form 08/22/22  was reviewed and there was no interval change after seeing and examining the patient. Pt npo since midnight and completed 2nd prep with clear BM.  Written consent was obtained from the patient after discussion of risks, benefits, and alternatives. Patient has consented to proceed with Colonoscopy and Video capsule endoscopy Paul Briggs)  with possible intervention   08/23/2022 1:38 PM  [name]  has presented today for surgery, with the diagnosis of melena, anemia.  The various methods of treatment have been discussed with the patient and family. After consideration of risks, benefits and other options for treatment, the patient has consented to  Procedure(s): COLONOSCOPY WITH PROPOFOL (N/A) GIVENS CAPSULE STUDY (N/A) as a surgical intervention.  The patient's history has been reviewed, patient examined, no change in status, stable for surgery.  I have reviewed the patient's chart and labs.  Questions were answered to the patient's satisfaction.     Jaynie Collins

## 2022-08-23 NOTE — Plan of Care (Signed)

## 2022-08-23 NOTE — Care Management Important Message (Signed)
Important Message  Patient Details  Name: Paul Amato [name] MRN: [id number] Date of Birth: [birth year]   Medicare Important Message Given:  Yes     Bernadette Hoit 08/23/2022, 3:34 PM

## 2022-08-23 NOTE — Op Note (Signed)
[facility] Gastroenterology Patient Name: Paul Briggs Procedure Date: 08/23/2022 1:32 PM MRN: [id number] Account #: [id number] Date of Birth: [birth year] Admit Type: Outpatient Age: 77 Room: [facility] ENDO ROOM 2 Gender: Male Note Status: Finalized Instrument Name: Colonscope [id number] Procedure:             Colonoscopy Indications:           Iron deficiency anemia Providers:             Trenda Moots, DO Referring MD:          Wilford Corner (Referring MD) Medicines:             Monitored Anesthesia Care Complications:         No immediate complications. Estimated blood loss:                         Minimal. Procedure:             Pre-Anesthesia Assessment:                        - Prior to the procedure, a History and Physical was                         performed, and patient medications and allergies were                         reviewed. The patient is competent. The risks and                         benefits of the procedure and the sedation options and                         risks were discussed with the patient. All questions                         were answered and informed consent was obtained.                         Patient identification and proposed procedure were                         verified by the physician, the nurse, the anesthetist                         and the technician in the endoscopy suite. Mental                         Status Examination: alert and oriented. Airway                         Examination: normal oropharyngeal airway and neck                         mobility. Respiratory Examination: clear to                         auscultation. CV Examination: RRR, no murmurs, no S3  or S4. Prophylactic Antibiotics: The patient does not                         require prophylactic antibiotics. Prior                         Anticoagulants: The patient has taken no anticoagulant                          or antiplatelet agents. ASA Grade Assessment: III - A                         patient with severe systemic disease. After reviewing                         the risks and benefits, the patient was deemed in                         satisfactory condition to undergo the procedure. The                         anesthesia plan was to use monitored anesthesia care                         (MAC). Immediately prior to administration of                         medications, the patient was re-assessed for adequacy                         to receive sedatives. The heart rate, respiratory                         rate, oxygen saturations, blood pressure, adequacy of                         pulmonary ventilation, and response to care were                         monitored throughout the procedure. The physical                         status of the patient was re-assessed after the                         procedure.                        After obtaining informed consent, the colonoscope was                         passed under direct vision. Throughout the procedure,                         the patient's blood pressure, pulse, and oxygen                         saturations were monitored continuously. The  Colonoscope was introduced through the anus and                         advanced to the the terminal ileum, with                         identification of the appendiceal orifice and IC                         valve. The colonoscopy was performed without                         difficulty. The patient tolerated the procedure well.                         The quality of the bowel preparation was evaluated                         using the BBPS [facility] Bowel Preparation Scale) with                         scores of: Right Colon = 1 (portion of mucosa seen,                         but other areas not well seen due to staining,                         residual stool and/or opaque  liquid), Transverse Colon                         = 2 (minor amount of residual staining, small                         fragments of stool and/or opaque liquid, but mucosa                         seen well) and Left Colon = 1 (portion of mucosa seen,                         but other areas not well seen due to staining,                         residual stool and/or opaque liquid). The total BBPS                         score equals 4. The quality of the bowel preparation                         was inadequate. The terminal ileum, ileocecal valve,                         appendiceal orifice, and rectum were photographed. Findings:      The perianal and digital rectal examinations were normal. Pertinent       negatives include normal sphincter tone.      The terminal ileum appeared normal. brown stool within      An ulcerated, laterally spreading partially  obstructing medium-sized       mass was found in the distal ascending colon. The mass was       non-circumferential. The mass measured fourteen cm in length. In       addition, its diameter measured fifteen mm. Friable. Biopsies were taken       with a cold forceps for histology. Area was tattooed with an injection       of 3 mL of [place] ink. Tattoo placed just distal to the lesion.      Three sessile polyps were found in the transverse colon (1) and       ascending colon (2). The polyps were 4 to 6 mm in size. These polyps       were removed with a cold snare. Resection and retrieval were complete.       Estimated blood loss was minimal.      Five pedunculated polyps were found in the rectum, descending colon,       transverse colon and ascending colon. The polyps were 3 to 7 mm in size.       These polyps were removed with a hot snare. Resection and retrieval were       complete. Estimated blood loss was minimal.      Multiple small-mouthed diverticula were found in the left colon.       Estimated blood loss: none.      Non-bleeding  internal hemorrhoids were found during retroflexion. The       hemorrhoids were Grade I (internal hemorrhoids that do not prolapse).       Estimated blood loss: none.      A large amount of stool was found in the entire colon, interfering with       visualization. Lavage of the area was performed using a large amount,       resulting in incomplete clearance with fair visualization. Estimated       blood loss: none.      The exam was otherwise without abnormality on direct and retroflexion       views. Impression:            - Preparation of the colon was inadequate.                        - The examined portion of the ileum was normal.                        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 Biopsied. Tattooed.                        - Three 4 to 6 mm polyps in the transverse colon and                         in the ascending colon, removed with a cold snare.                         Resected and retrieved.                        - Five 3 to 7 mm polyps in the rectum, in  the                         descending colon, in the transverse colon and in the                         ascending colon, removed with a hot snare. Resected                         and retrieved.                        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                        - Non-bleeding internal hemorrhoids.                        - Stool in the entire examined colon.                        - The examination was otherwise normal on direct and                         retroflexion views. Recommendation:        - Patient has a contact number available for                         emergencies. The signs and symptoms of potential                         delayed complications were discussed with the patient.                         Return to normal activities tomorrow. Written                         discharge instructions were provided to the patient.                         - Discharge patient to home.                        - Resume regular diet.                        - Continue present medications.                        - No ibuprofen, naproxen, or other non-steroidal                         anti-inflammatory drugs for 5 days after polyp removal.                        - Await pathology results.                        - Repeat colonoscopy 6 months after surgical  intervention. 2 day prep.                        - Return to GI office at appointment to be scheduled.                        - Refer to an oncologist at appointment to be                         scheduled.                        - Refer to a surgeon at appointment to be scheduled.                        - The findings and recommendations were discussed with                         the patient.                        - The findings and recommendations were discussed with                         the patient's family.                        - The findings and recommendations were discussed with                         the referring physician. Procedure Code(s):     --- Professional ---                        [phone], Colonoscopy, flexible; with removal of                         tumor(s), polyp(s), or other lesion(s) by snare                         technique                        45380, 59, Colonoscopy, flexible; with biopsy, single                         or multiple                        45381, Colonoscopy, flexible; with directed submucosal                         injection(s), any substance Diagnosis Code(s):     --- Professional ---                        K64.0, First degree hemorrhoids                        D49.0, Neoplasm of unspecified behavior of digestive                         system  K56.690, Other partial intestinal obstruction                        D12.8, Benign neoplasm of rectum                        D12.4,  Benign neoplasm of descending colon                        D12.3, Benign neoplasm of transverse colon (hepatic                         flexure or splenic flexure)                        D12.2, Benign neoplasm of ascending colon                        D50.9, Iron deficiency anemia, unspecified                        K57.30, Diverticulosis of large intestine without                         perforation or abscess without bleeding CPT copyright 2022 American Medical Association. All rights reserved. The codes documented in this report are preliminary and upon coder review may  be revised to meet current compliance requirements. Attending Participation:      I personally performed the entire procedure. Elfredia Nevins, DO Jaynie Collins DO, DO 08/23/2022 2:53:48 PM This report has been signed electronically. Number of Addenda: 0 Note Initiated On: 08/23/2022 1:32 PM Scope Withdrawal Time: 0 hours 21 minutes 23 seconds  Total Procedure Duration: 0 hours 38 minutes 35 seconds  Estimated Blood Loss:  Estimated blood loss was minimal.      [facility]

## 2022-08-23 NOTE — Care Plan (Signed)
Brief GI Op Note  Please see op report for further details Patient with ascending colon mass which is likely source of melena and persistent iron deficiency.  Also had multiple other polyps that were resected.  Prep was still suboptimal despite 2-day prep. I have discussed the case with his hospitalist and his outpatient oncologist Will need to arrange for outpatient surgical follow-up  I have discussed these findings with his daughter and wife at bedside as well as the patient  GI to sign off. Available as needed. Please do not hesitate to call regarding questions or concerns.  Enis Slipper, DO [facility] Gastroenterology

## 2022-08-24 ENCOUNTER — Telehealth: Payer: Self-pay

## 2022-08-24 ENCOUNTER — Encounter: Payer: Self-pay | Admitting: Gastroenterology

## 2022-08-24 DIAGNOSIS — D649 Anemia, unspecified: Secondary | ICD-10-CM | POA: Diagnosis not present

## 2022-08-24 LAB — GLUCOSE, CAPILLARY: Glucose-Capillary: 92 mg/dL (ref 70–99)

## 2022-08-24 NOTE — Consult Note (Signed)
[facility] Customer service manager [facility]) Accountable Care Organization (ACO) [facility] Liaison Note  08/24/2022  Paul Reo [name] [birth year] [id number]  Location: [facility] RN Hospital Liaison screened the patient remotely at [facility].  Insurance: [employer]   Paul Motte [name] is a 77 y.o. male who is a Primary Care Patient of Paul Briggs, Paul Finner, PA-C [facility]). The patient was screened for readmission hospitalization with noted medium risk score for unplanned readmission risk with  1 IP in 6 months.  The patient was assessed for potential [facility]) Care Management service needs for post hospital transition for care coordination. Review of patient's electronic medical record reveals patient was admitted for Acute on chronic anemia. No anticipated needs at this time.   Plan: [facility] Liaison will continue to follow progress and disposition to asess for post hospital community care coordination/management needs.  Referral request for community care coordination: pending disposition.   [facility] Care Management/Population Health does not replace or interfere with any arrangements made by the Inpatient Transition of Care team.   For questions contact:   Elliot Cousin, RN, [facility] Liaison [place]   Population Health Office Hours MTWF  8:00 am-6:00 pm Off on Thursday [phone] mobile [phone] [Office toll free line] Office Hours are M-F 8:30 - 5 pm 24 hour nurse advise line [phone] [email or web address]  [email or web address]

## 2022-08-24 NOTE — Discharge Summary (Signed)
Physician Discharge Summary  Okechukwu Guidos [name] ZOX:[id number] DOB: Jan 19, 1946 DOA: 08/20/2022  PCP: Wilford Corner, PA-C  Admit date: 08/20/2022 Discharge date: 08/24/2022  Admitted From: Home Disposition:  Home  Recommendations for Outpatient Follow-up:  Follow up with PCP in 1-2 weeks Follow-up outpatient GI Follow-up outpatient oncology Will need ambulatory referral to surgery  Home Health: No Equipment/Devices: None  Discharge Condition: Stable CODE STATUS: Full Diet recommendation: Regular  Brief/Interim Summary:  77 year old male with history of CLL, iron deficiency anemia, CKD 3A, hypertension, chronic low back pain, hyperlipidemia, non-insulin-dependent diabetes mellitus, who presents emergency department from cancer center for chief concerns of low hemoglobin at 6.4.   His daughter is concerned that the reason why his hemoglobin was so low today, is because he missed the iron transfusion last week.  He came in today to outpatient cancer center and received his IV transfusion on 7/26 prior to ED presentation.  He was ultimately sent to the emergency department as he had regular labs done and showed hemoglobin low at 6.4.   7/27: Status post EGD with GI.  Unremarkable.  Plan for colonoscopy 7/28 7/28: Hb 8.2 this AM.  Plan for c-scope 7/29: Patient did not have clear stools yesterday despite completing prep.  Repeat prep.  Hemoglobin 8.7.  Stools now running clear.  Plan for colonoscopy and possible VCE today 7/30: Partially obstructing mass noted on colonoscopy.  Likely source of refractory anemia.  Discussed with GI who discussed the case with oncology.  Patient is stable for discharge.  Hemoglobin 8.3 at time of DC.  Does not meet criteria for transfusion.  Patient will follow-up with PCP, GI, oncology.  He will likely be referred to surgery however final pathology is pending at this time we will not refer from hospital.  Defer to outpatient oncology and  GI.    Discharge Diagnoses:  Principal Problem:   Acute on chronic anemia Active Problems:   Essential hypertension   Type II diabetes mellitus with renal manifestations (HCC)   BPH with obstruction/lower urinary tract symptoms   HLD (hyperlipidemia)   Hypothyroidism   Chronic back pain   OSA on CPAP   CLL (chronic lymphocytic leukemia) (HCC)   IDA (iron deficiency anemia)  Acute on chronic anemia New diagnosis colonic mass Malignant appearing mass noted on colonoscopy.  Case was discussed with GI and oncology.  Patient's hemoglobin is stable at 8.3.  Stable for discharge at this time.  Will need close follow-up as outpatient with oncology, GI, primary care.  Will need referral to surgery after final pathology has resulted.  Discharge Instructions  Discharge Instructions     Diet - low sodium heart healthy   Complete by: As directed    Increase activity slowly   Complete by: As directed       Allergies as of 08/24/2022   No Known Allergies      Medication List     STOP taking these medications    metoprolol tartrate 100 MG tablet Commonly known as: LOPRESSOR       TAKE these medications    aspirin EC 81 MG tablet Take 1 tablet (81 mg total) by mouth daily. Swallow whole.   Brukinsa 80 MG capsule Generic drug: zanubrutinib Take 2 capsules (160 mg total) by mouth 2 (two) times daily.   dapagliflozin propanediol 10 MG Tabs tablet Commonly known as: Farxiga Take 1 tablet (10 mg total) by mouth daily before breakfast.   hydrALAZINE 50 MG tablet Commonly known as: APRESOLINE  Take 1 tablet (50 mg total) by mouth in the morning and at bedtime.   levothyroxine 200 MCG tablet Commonly known as: SYNTHROID Take 200 mcg by mouth daily.   metFORMIN 500 MG tablet Commonly known as: GLUCOPHAGE Take 500 mg by mouth 2 (two) times daily with a meal.   metoprolol succinate 25 MG 24 hr tablet Commonly known as: Toprol XL Take 1 tablet (25 mg total) by mouth  daily.   oxyCODONE-acetaminophen 10-325 MG tablet Commonly known as: PERCOCET Take 1 tablet by mouth every 8 (eight) hours as needed.   oxyCODONE-acetaminophen 10-325 MG tablet Commonly known as: PERCOCET Take 1 tablet by mouth every 8 (eight) hours as needed. Start taking on: September 22, 2022   oxyCODONE-acetaminophen 10-325 MG tablet Commonly known as: PERCOCET Take 1 tablet by mouth every 8 (eight) hours as needed. Start taking on: October 22, 2022   pregabalin 100 MG capsule Commonly known as: Lyrica Take 1 capsule (100 mg total) by mouth 2 (two) times daily.   rosuvastatin 40 MG tablet Commonly known as: CRESTOR Take 1 tablet (40 mg total) by mouth daily.   trospium 20 MG tablet Commonly known as: SANCTURA Take 1 tablet (20 mg total) by mouth 2 (two) times daily.   valsartan 160 MG tablet Commonly known as: DIOVAN Take 160 mg by mouth daily.        No Known Allergies  Consultations: GI   Procedures/Studies: ECHOCARDIOGRAM COMPLETE  Result Date: 08/06/2022    ECHOCARDIOGRAM REPORT   Patient Name:   Paul Stolarz [name] Date of Exam: 08/05/2022 Medical Rec #:  [id number]                 Height:       72.0 in Accession #:    [id number]                Weight:       237.4 lb Date of Birth:  April 29, 1945                  BSA:          2.292 m Patient Age:    77 years                  BP:           175/84 mmHg Patient Gender: M                         HR:           60 bpm. Exam Location:  [facility] Procedure: 2D Echo, 3D Echo, Cardiac Doppler, Color Doppler and Strain Analysis Indications:    I44.7 LBBB  History:        Patient has no prior history of Echocardiogram examinations.                 Arrythmias:LBBB, Signs/Symptoms:Dizziness/Lightheadedness; Risk                 Factors:Hypertension, Former Smoker, Diabetes and Sleep Apnea.  Sonographer:    Ilda Mori RDCS, MHA Referring Phys: [id number] BRIAN AGBOR-ETANG IMPRESSIONS  1. Left ventricular ejection fraction,  by estimation, is 45 to 50%. Left ventricular ejection fraction by 3D volume is 47 %. The left ventricle has mildly decreased function. The left ventricle has no regional wall motion abnormalities. 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 Left ventricular diastolic parameters are consistent with Grade I diastolic dysfunction (impaired  relaxation). The average left ventricular global longitudinal strain is -18.8 %. Consider w/u for [facility] if clinically indicated.  2. Right ventricular systolic function is normal. The right ventricular size is normal. Tricuspid regurgitation signal is inadequate for assessing PA pressure.  3. Left atrial size was severely dilated.  4. Right atrial size was mildly dilated.  5. A small pericardial effusion is present. There is no evidence of cardiac tamponade.  6. The mitral valve is normal in structure. Mild mitral valve regurgitation. No evidence of mitral stenosis.  7. The aortic valve is tricuspid. Aortic valve regurgitation is not visualized. Aortic valve sclerosis/calcification is present, without any evidence of aortic stenosis.  8. The inferior vena cava is normal in size with greater than 50% respiratory variability, suggesting right atrial pressure of 3 mmHg. FINDINGS  Left Ventricle: Left ventricular ejection fraction, by estimation, is 45 to 50%. Left ventricular ejection fraction by 3D volume is 47 %. The left ventricle has mildly decreased function. The left ventricle has no regional wall motion abnormalities. The  average left ventricular global longitudinal strain is -18.8 %. The left ventricular internal cavity size was normal in size. There is moderate concentric left ventricular hypertrophy. Left ventricular diastolic parameters are consistent with Grade I diastolic dysfunction (impaired relaxation). Right Ventricle: The right ventricular size is normal. No increase in right ventricular wall thickness. Right  ventricular systolic function is normal. Tricuspid regurgitation signal is inadequate for assessing PA pressure. Left Atrium: Left atrial size was severely dilated. Right Atrium: Right atrial size was mildly dilated. Pericardium: A small pericardial effusion is present. There is no evidence of cardiac tamponade. Mitral Valve: The mitral valve is normal in structure. There is mild calcification of the mitral valve leaflet(s). Mild mitral valve regurgitation. No evidence of mitral valve stenosis. Tricuspid Valve: The tricuspid valve is normal in structure. Tricuspid valve regurgitation is mild . No evidence of tricuspid stenosis. Aortic Valve: The aortic valve is tricuspid. Aortic valve regurgitation is not visualized. Aortic valve sclerosis/calcification is present, without any evidence of aortic stenosis. Pulmonic Valve: The pulmonic valve was normal in structure. Pulmonic valve regurgitation is not visualized. No evidence of pulmonic stenosis. Aorta: The aortic root is normal in size and structure. Venous: The inferior vena cava is normal in size with greater than 50% respiratory variability, suggesting right atrial pressure of 3 mmHg. IAS/Shunts: No atrial level shunt detected by color flow Doppler.  LEFT VENTRICLE PLAX 2D LVIDd:         5.00 cm         Diastology LVIDs:         4.00 cm         LV e' medial:    4.24 cm/s LV PW:         1.70 cm         LV E/e' medial:  18.8 LV IVS:        1.70 cm         LV e' lateral:   5.87 cm/s                                LV E/e' lateral: 13.6                                 2D  Longitudinal                                Strain                                2D Strain GLS  -18.8 %                                Avg:                                 3D Volume EF                                LV 3D EF:    Left                                             ventricul                                             ar                                              ejection                                             fraction                                             by 3D                                             volume is                                             47 %.                                 3D Volume EF:                                3D EF:        47 %  LV EDV:       204 ml                                LV ESV:       108 ml                                LV SV:        95 ml RIGHT VENTRICLE RV Basal diam:  2.80 cm RV Mid diam:    2.90 cm RV S prime:     20.50 cm/s TAPSE (M-mode): 2.3 cm LEFT ATRIUM              Index        RIGHT ATRIUM           Index LA diam:        6.10 cm  2.66 cm/m   RA Area:     24.80 cm LA Vol (A2C):   182.0 ml 79.42 ml/m  RA Volume:   72.00 ml  31.42 ml/m LA Vol (A4C):   120.0 ml 52.37 ml/m LA Biplane Vol: 150.0 ml 65.46 ml/m   AORTA Ao Root diam: 3.60 cm Ao Asc diam:  3.70 cm MITRAL VALVE MV Area (PHT): 2.80 cm MV Decel Time: 271 msec MV E velocity: 79.70 cm/s MV A velocity: 99.00 cm/s MV E/A ratio:  0.81 Julien Nordmann MD Electronically signed by Julien Nordmann MD Signature Date/Time: 08/06/2022/8:50:22 AM    Final       Subjective: Seen and examined on the day of discharge.  Stable no distress.  Wife at bedside.  Appropriate for discharge home at this time.  Will need close outpatient follow-up with PCP, GI, oncology  Discharge Exam: Vitals:   08/24/22 0347 08/24/22 0803  BP: (!) 156/77 (!) 171/84  Pulse: 63 61  Resp: 18 18  Temp: 98.5 F (36.9 C) 97.9 F (36.6 C)  SpO2: 98% 98%   Vitals:   08/23/22 2031 08/23/22 2333 08/24/22 0347 08/24/22 0803  BP: (!) 153/75 (!) 152/77 (!) 156/77 (!) 171/84  Pulse: 75 74 63 61  Resp: 18 18 18 18   Temp: 98.8 F (37.1 C) 99.7 F (37.6 C) 98.5 F (36.9 C) 97.9 F (36.6 C)  TempSrc:      SpO2: 96% 98% 98% 98%  Weight:      Height:        General: Pt is alert, awake, not in acute distress Cardiovascular: RRR, S1/S2 +, no  rubs, no gallops Respiratory: CTA bilaterally, no wheezing, no rhonchi Abdominal: Soft, NT, ND, bowel sounds + Extremities: no edema, no cyanosis    The results of significant diagnostics from this hospitalization (including imaging, microbiology, ancillary and laboratory) are listed below for reference.     Microbiology: No results found for this or any previous visit (from the past 240 hour(s)).   Labs: BNP (last 3 results) No results for input(s): "BNP" in the last 8760 hours. Basic Metabolic Panel: Recent Labs  Lab 08/20/22 1350 08/21/22 0216 08/22/22 0412 08/23/22 0502 08/24/22 0419  NA 139 137 141 142 141  K 4.1 3.2* 3.7 4.1 3.7  CL 107 110 112* 111 112*  CO2 24 20* 23 24 23   GLUCOSE 115* 118* 92 103* 148*  BUN 16 14 9 8 14   CREATININE 1.35* 1.13 0.99 1.10 1.12  CALCIUM 8.0* 7.7* 7.9*  8.2* 8.1*   Liver Function Tests: Recent Labs  Lab 08/20/22 1350  AST 61*  ALT 23  ALKPHOS 44  BILITOT 1.0  PROT 6.1*  ALBUMIN 3.4*   No results for input(s): "LIPASE", "AMYLASE" in the last 168 hours. No results for input(s): "AMMONIA" in the last 168 hours. CBC: Recent Labs  Lab 08/20/22 1231 08/20/22 1350 08/20/22 2210 08/21/22 0216 08/21/22 1220 08/22/22 0052 08/22/22 0412 08/23/22 0502 08/24/22 0419  WBC 7.0 6.8  --  6.4  --   --  7.4 7.7 8.3  NEUTROABS 2.5  --   --   --   --   --   --   --  5.4  HGB 6.4* 6.1*   < > 6.4* 7.8* 8.3* 8.2* 8.7* 8.3*  HCT 20.9* 19.9*   < > 20.5* 25.2* 26.4* 25.8* 27.3* 25.9*  MCV 91.3 89.6  --  90.7  --   --  88.4 90.1 89.6  PLT 150 155  --  147*  --   --  143* 151 157   < > = values in this interval not displayed.   Cardiac Enzymes: No results for input(s): "CKTOTAL", "CKMB", "CKMBINDEX", "TROPONINI" in the last 168 hours. BNP: Invalid input(s): "POCBNP" CBG: Recent Labs  Lab 08/23/22 0758 08/23/22 1223 08/23/22 1631 08/23/22 2032 08/24/22 0803  GLUCAP 84 96 172* 152* 92   D-Dimer No results for input(s): "DDIMER"  in the last 72 hours. Hgb A1c No results for input(s): "HGBA1C" in the last 72 hours. Lipid Profile No results for input(s): "CHOL", "HDL", "LDLCALC", "TRIG", "CHOLHDL", "LDLDIRECT" in the last 72 hours. Thyroid function studies No results for input(s): "TSH", "T4TOTAL", "T3FREE", "THYROIDAB" in the last 72 hours.  Invalid input(s): "FREET3" Anemia work up No results for input(s): "VITAMINB12", "FOLATE", "FERRITIN", "TIBC", "IRON", "RETICCTPCT" in the last 72 hours. Urinalysis    Component Value Date/Time   COLORURINE YELLOW (A) 08/21/2022 1345   APPEARANCEUR CLEAR (A) 08/21/2022 1345   APPEARANCEUR Clear 10/04/2019 1128   LABSPEC 1.018 08/21/2022 1345   LABSPEC 1.019 06/16/2013 1607   PHURINE 5.0 08/21/2022 1345   GLUCOSEU NEGATIVE 08/21/2022 1345   GLUCOSEU Negative 06/16/2013 1607   HGBUR NEGATIVE 08/21/2022 1345   BILIRUBINUR NEGATIVE 08/21/2022 1345   BILIRUBINUR Negative 07/24/2020 1013   BILIRUBINUR Negative 10/04/2019 1128   BILIRUBINUR Negative 06/16/2013 1607   KETONESUR NEGATIVE 08/21/2022 1345   PROTEINUR 30 (A) 08/21/2022 1345   UROBILINOGEN 0.2 07/24/2020 1013   NITRITE NEGATIVE 08/21/2022 1345   LEUKOCYTESUR NEGATIVE 08/21/2022 1345   LEUKOCYTESUR Negative 06/16/2013 1607   Sepsis Labs Recent Labs  Lab 08/21/22 0216 08/22/22 0412 08/23/22 0502 08/24/22 0419  WBC 6.4 7.4 7.7 8.3   Microbiology No results found for this or any previous visit (from the past 240 hour(s)).   Time coordinating discharge: Over 30 minutes  SIGNED:   Tresa Moore, MD  [facility] 08/24/2022, 1:57 PM Pager   If 7PM-7AM, please contact night-coverage

## 2022-08-24 NOTE — Telephone Encounter (Signed)
-----   Message from [facility] sent at 08/23/2022  7:09 PM EDT ----- Regarding: RE: ACDF on 8/16 Sounds like it needs to be postponed until that situation is sorted. ----- Message ----- From: Sharlot Gowda, RN Sent: 08/23/2022   5:31 PM EDT To: Venetia Night, MD Subject: ACDF on 8/16                                   I'm wondering if his surgery with you on 09/10/22 might need to be postponed/canceled until further notice?  He missed his clearance appt with Debbra Riding today because he is currently admitted for anemia since 08/20/22.   Had colonoscopy today and they found a mass...sounds like they are referring him back to oncology and for surgery and repeat colonoscopy 6 months after surgery. Path is pending...  " An ulcerated, laterally spreading partially obstructing medium-sized       mass was found in the distal ascending colon. The mass was       non-circumferential. The mass measured fourteen cm in length. In       addition, its diameter measured fifteen mm. Friable.   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

## 2022-08-24 NOTE — Plan of Care (Signed)
  Problem: Clinical Measurements: Goal: Ability to maintain clinical measurements within normal limits will improve Outcome: Progressing   Problem: Clinical Measurements: Goal: Respiratory complications will improve Outcome: Progressing   Problem: Activity: Goal: Risk for activity intolerance will decrease Outcome: Progressing   Problem: Nutrition: Goal: Adequate nutrition will be maintained Outcome: Progressing   Problem: Pain Managment: Goal: General experience of comfort will improve Outcome: Progressing

## 2022-08-24 NOTE — Telephone Encounter (Signed)
I spoke with Paul Briggs and informed him that due to the recent finding of a mass in his colon, Dr Myer Haff has recommended that we cancel his neck surgery at this time. I advised that he contact our office when he is recovered from this and ready to reschedule. He verbalized understanding and asked that I contact his daughter to discuss. I left a voicemail for his daughter Paul Briggs.

## 2022-08-25 ENCOUNTER — Encounter (HOSPITAL_COMMUNITY): Payer: Self-pay

## 2022-08-25 ENCOUNTER — Other Ambulatory Visit: Payer: Self-pay

## 2022-08-26 ENCOUNTER — Emergency Department: Payer: No Typology Code available for payment source

## 2022-08-26 ENCOUNTER — Other Ambulatory Visit: Payer: Self-pay

## 2022-08-26 ENCOUNTER — Inpatient Hospital Stay: Payer: No Typology Code available for payment source

## 2022-08-26 ENCOUNTER — Observation Stay: Payer: No Typology Code available for payment source

## 2022-08-26 ENCOUNTER — Inpatient Hospital Stay: Payer: No Typology Code available for payment source | Admitting: Oncology

## 2022-08-26 ENCOUNTER — Ambulatory Visit
Admission: RE | Admit: 2022-08-26 | Discharge: 2022-08-26 | Disposition: A | Discharge status: Home / Self Care | Payer: No Typology Code available for payment source | Source: Ambulatory Visit | Attending: Medical | Admitting: Medical

## 2022-08-26 ENCOUNTER — Inpatient Hospital Stay
Admission: EM | Admit: 2022-08-26 | Discharge: 2022-09-01 | DRG: 854 | Disposition: A | Discharge status: Home Health | Payer: No Typology Code available for payment source | Attending: Internal Medicine | Admitting: Internal Medicine

## 2022-08-26 DIAGNOSIS — I429 Cardiomyopathy, unspecified: Secondary | ICD-10-CM | POA: Diagnosis present

## 2022-08-26 DIAGNOSIS — S301XXA Contusion of abdominal wall, initial encounter: Secondary | ICD-10-CM | POA: Diagnosis present

## 2022-08-26 DIAGNOSIS — L0390 Cellulitis, unspecified: Secondary | ICD-10-CM

## 2022-08-26 DIAGNOSIS — I517 Cardiomegaly: Secondary | ICD-10-CM | POA: Diagnosis not present

## 2022-08-26 DIAGNOSIS — Z5986 Financial insecurity: Secondary | ICD-10-CM

## 2022-08-26 DIAGNOSIS — I10 Essential (primary) hypertension: Secondary | ICD-10-CM | POA: Diagnosis present

## 2022-08-26 DIAGNOSIS — E785 Hyperlipidemia, unspecified: Secondary | ICD-10-CM | POA: Diagnosis present

## 2022-08-26 DIAGNOSIS — A419 Sepsis, unspecified organism: Secondary | ICD-10-CM | POA: Diagnosis not present

## 2022-08-26 DIAGNOSIS — R7989 Other specified abnormal findings of blood chemistry: Principal | ICD-10-CM

## 2022-08-26 DIAGNOSIS — I2489 Other forms of acute ischemic heart disease: Secondary | ICD-10-CM | POA: Diagnosis present

## 2022-08-26 DIAGNOSIS — R590 Localized enlarged lymph nodes: Secondary | ICD-10-CM | POA: Diagnosis not present

## 2022-08-26 DIAGNOSIS — Z87891 Personal history of nicotine dependence: Secondary | ICD-10-CM

## 2022-08-26 DIAGNOSIS — R109 Unspecified abdominal pain: Secondary | ICD-10-CM | POA: Diagnosis not present

## 2022-08-26 DIAGNOSIS — A4102 Sepsis due to Methicillin resistant Staphylococcus aureus: Principal | ICD-10-CM | POA: Diagnosis present

## 2022-08-26 DIAGNOSIS — E119 Type 2 diabetes mellitus without complications: Secondary | ICD-10-CM

## 2022-08-26 DIAGNOSIS — L02416 Cutaneous abscess of left lower limb: Secondary | ICD-10-CM | POA: Diagnosis present

## 2022-08-26 DIAGNOSIS — C9110 Chronic lymphocytic leukemia of B-cell type not having achieved remission: Secondary | ICD-10-CM

## 2022-08-26 DIAGNOSIS — M1612 Unilateral primary osteoarthritis, left hip: Secondary | ICD-10-CM | POA: Diagnosis not present

## 2022-08-26 DIAGNOSIS — F1190 Opioid use, unspecified, uncomplicated: Secondary | ICD-10-CM | POA: Insufficient documentation

## 2022-08-26 DIAGNOSIS — I130 Hypertensive heart and chronic kidney disease with heart failure and stage 1 through stage 4 chronic kidney disease, or unspecified chronic kidney disease: Secondary | ICD-10-CM | POA: Diagnosis present

## 2022-08-26 DIAGNOSIS — I447 Left bundle-branch block, unspecified: Secondary | ICD-10-CM | POA: Diagnosis present

## 2022-08-26 DIAGNOSIS — Z1152 Encounter for screening for COVID-19: Secondary | ICD-10-CM

## 2022-08-26 DIAGNOSIS — I5022 Chronic systolic (congestive) heart failure: Secondary | ICD-10-CM | POA: Diagnosis present

## 2022-08-26 DIAGNOSIS — G8929 Other chronic pain: Secondary | ICD-10-CM | POA: Diagnosis present

## 2022-08-26 DIAGNOSIS — R296 Repeated falls: Secondary | ICD-10-CM

## 2022-08-26 DIAGNOSIS — L089 Local infection of the skin and subcutaneous tissue, unspecified: Secondary | ICD-10-CM

## 2022-08-26 DIAGNOSIS — Z79890 Hormone replacement therapy (postmenopausal): Secondary | ICD-10-CM

## 2022-08-26 DIAGNOSIS — M25552 Pain in left hip: Secondary | ICD-10-CM | POA: Diagnosis not present

## 2022-08-26 DIAGNOSIS — Z555 Less than a high school diploma: Secondary | ICD-10-CM

## 2022-08-26 DIAGNOSIS — W010XXA Fall on same level from slipping, tripping and stumbling without subsequent striking against object, initial encounter: Secondary | ICD-10-CM | POA: Diagnosis present

## 2022-08-26 DIAGNOSIS — N400 Enlarged prostate without lower urinary tract symptoms: Secondary | ICD-10-CM | POA: Diagnosis present

## 2022-08-26 DIAGNOSIS — W19XXXA Unspecified fall, initial encounter: Secondary | ICD-10-CM

## 2022-08-26 DIAGNOSIS — Z7984 Long term (current) use of oral hypoglycemic drugs: Secondary | ICD-10-CM

## 2022-08-26 DIAGNOSIS — N3000 Acute cystitis without hematuria: Secondary | ICD-10-CM | POA: Diagnosis not present

## 2022-08-26 DIAGNOSIS — E039 Hypothyroidism, unspecified: Secondary | ICD-10-CM | POA: Diagnosis present

## 2022-08-26 DIAGNOSIS — E876 Hypokalemia: Secondary | ICD-10-CM | POA: Diagnosis present

## 2022-08-26 DIAGNOSIS — G4733 Obstructive sleep apnea (adult) (pediatric): Secondary | ICD-10-CM | POA: Diagnosis present

## 2022-08-26 DIAGNOSIS — L03116 Cellulitis of left lower limb: Secondary | ICD-10-CM | POA: Diagnosis present

## 2022-08-26 DIAGNOSIS — D72829 Elevated white blood cell count, unspecified: Secondary | ICD-10-CM | POA: Diagnosis present

## 2022-08-26 DIAGNOSIS — L02214 Cutaneous abscess of groin: Secondary | ICD-10-CM | POA: Diagnosis present

## 2022-08-26 DIAGNOSIS — I2510 Atherosclerotic heart disease of native coronary artery without angina pectoris: Secondary | ICD-10-CM

## 2022-08-26 DIAGNOSIS — J189 Pneumonia, unspecified organism: Secondary | ICD-10-CM | POA: Diagnosis present

## 2022-08-26 DIAGNOSIS — K5730 Diverticulosis of large intestine without perforation or abscess without bleeding: Secondary | ICD-10-CM | POA: Diagnosis not present

## 2022-08-26 DIAGNOSIS — N183 Chronic kidney disease, stage 3 unspecified: Secondary | ICD-10-CM | POA: Diagnosis present

## 2022-08-26 DIAGNOSIS — Z7982 Long term (current) use of aspirin: Secondary | ICD-10-CM

## 2022-08-26 DIAGNOSIS — Z043 Encounter for examination and observation following other accident: Secondary | ICD-10-CM | POA: Diagnosis not present

## 2022-08-26 DIAGNOSIS — M8588 Other specified disorders of bone density and structure, other site: Secondary | ICD-10-CM | POA: Diagnosis not present

## 2022-08-26 DIAGNOSIS — Z79899 Other long term (current) drug therapy: Secondary | ICD-10-CM

## 2022-08-26 DIAGNOSIS — Z8614 Personal history of Methicillin resistant Staphylococcus aureus infection: Secondary | ICD-10-CM

## 2022-08-26 DIAGNOSIS — D509 Iron deficiency anemia, unspecified: Secondary | ICD-10-CM | POA: Diagnosis present

## 2022-08-26 DIAGNOSIS — L03314 Cellulitis of groin: Secondary | ICD-10-CM | POA: Diagnosis present

## 2022-08-26 DIAGNOSIS — T451X5A Adverse effect of antineoplastic and immunosuppressive drugs, initial encounter: Secondary | ICD-10-CM | POA: Diagnosis present

## 2022-08-26 DIAGNOSIS — M60059 Infective myositis, unspecified thigh: Secondary | ICD-10-CM

## 2022-08-26 DIAGNOSIS — E1122 Type 2 diabetes mellitus with diabetic chronic kidney disease: Secondary | ICD-10-CM | POA: Diagnosis present

## 2022-08-26 DIAGNOSIS — D84821 Immunodeficiency due to drugs: Secondary | ICD-10-CM | POA: Diagnosis present

## 2022-08-26 HISTORY — DX: Cardiomegaly: I51.7

## 2022-08-26 HISTORY — DX: Sepsis, unspecified organism: A41.9

## 2022-08-26 HISTORY — DX: Nonrheumatic mitral (valve) insufficiency: I34.0

## 2022-08-26 HISTORY — DX: Other ill-defined heart diseases: I51.89

## 2022-08-26 HISTORY — DX: Cellulitis of left lower limb: L03.116

## 2022-08-26 HISTORY — DX: Cellulitis, unspecified: L03.90

## 2022-08-26 HISTORY — DX: Personal history of Methicillin resistant Staphylococcus aureus infection: Z86.14

## 2022-08-26 HISTORY — DX: Atherosclerotic heart disease of native coronary artery without angina pectoris: I25.10

## 2022-08-26 HISTORY — DX: Local infection of the skin and subcutaneous tissue, unspecified: L08.9

## 2022-08-26 LAB — BASIC METABOLIC PANEL
Anion gap: 9 (ref 5–15)
BUN: 12 mg/dL (ref 8–23)
CO2: 24 mmol/L (ref 22–32)
Calcium: 8.1 mg/dL — ABNORMAL LOW (ref 8.9–10.3)
Chloride: 104 mmol/L (ref 98–111)
Creatinine, Ser: 1.15 mg/dL (ref 0.61–1.24)
GFR, Estimated: >60 mL/min (ref >60)
Glucose, Bld: 163 mg/dL — ABNORMAL HIGH (ref 70–99)
Potassium: 3.6 mmol/L (ref 3.5–5.1)
Sodium: 137 mmol/L (ref 135–145)

## 2022-08-26 LAB — URINALYSIS, COMPLETE (UACMP) WITH MICROSCOPIC
Bacteria, UA: NONE SEEN
Bilirubin Urine: NEGATIVE
Glucose, UA: NEGATIVE mg/dL
Hgb urine dipstick: NEGATIVE
Ketones, ur: 20 mg/dL — AB
Leukocytes,Ua: NEGATIVE
Nitrite: NEGATIVE
Protein, ur: 30 mg/dL — AB
Specific Gravity, Urine: >1.046 — ABNORMAL HIGH (ref 1.005–1.030)
pH: 6 (ref 5.0–8.0)

## 2022-08-26 LAB — CBC WITH DIFFERENTIAL/PLATELET
Abs Immature Granulocytes: 0.1 10*3/uL — ABNORMAL HIGH (ref 0.00–0.07)
Basophils Absolute: 0.1 10*3/uL (ref 0.0–0.1)
Basophils Relative: 1 %
Eosinophils Absolute: 0.2 10*3/uL (ref 0.0–0.5)
Eosinophils Relative: 2 %
HCT: 26.2 % — ABNORMAL LOW (ref 39.0–52.0)
Hemoglobin: 8.1 g/dL — ABNORMAL LOW (ref 13.0–17.0)
Immature Granulocytes: 1 %
Lymphocytes Relative: 16 %
Lymphs Abs: 2.2 10*3/uL (ref 0.7–4.0)
MCH: 28.3 pg (ref 26.0–34.0)
MCHC: 30.9 g/dL (ref 30.0–36.0)
MCV: 91.6 fL (ref 80.0–100.0)
Monocytes Absolute: 1.8 10*3/uL — ABNORMAL HIGH (ref 0.1–1.0)
Monocytes Relative: 13 %
Neutro Abs: 9 10*3/uL — ABNORMAL HIGH (ref 1.7–7.7)
Neutrophils Relative %: 67 %
Platelets: 158 10*3/uL (ref 150–400)
RBC: 2.86 MIL/uL — ABNORMAL LOW (ref 4.22–5.81)
RDW: 17.9 % — ABNORMAL HIGH (ref 11.5–15.5)
Smear Review: NORMAL
WBC: 13.2 10*3/uL — ABNORMAL HIGH (ref 4.0–10.5)
nRBC: 0 % (ref 0.0–0.2)

## 2022-08-26 LAB — PROTIME-INR
INR: 1.4 — ABNORMAL HIGH (ref 0.8–1.2)
Prothrombin Time: 16.9 seconds — ABNORMAL HIGH (ref 11.4–15.2)

## 2022-08-26 LAB — TROPONIN I (HIGH SENSITIVITY)
Troponin I (High Sensitivity): 49 ng/L — ABNORMAL HIGH (ref <18)
Troponin I (High Sensitivity): 58 ng/L — ABNORMAL HIGH (ref <18)

## 2022-08-26 LAB — APTT: aPTT: 42 seconds — ABNORMAL HIGH (ref 24–36)

## 2022-08-26 LAB — PROCALCITONIN: Procalcitonin: 0.15 ng/mL

## 2022-08-26 LAB — LACTIC ACID, PLASMA: Lactic Acid, Venous: 1.2 mmol/L (ref 0.5–1.9)

## 2022-08-26 MED ORDER — ASPIRIN 81 MG PO TBEC
81.0000 mg | DELAYED_RELEASE_TABLET | Freq: Every day | ORAL | Status: DC
  Administered 2022-08-27 – 2022-09-01 (×6): 81 mg via ORAL
  Filled 2022-08-26 (×6): qty 1

## 2022-08-26 MED ORDER — DILTIAZEM HCL 25 MG/5ML IV SOLN: 10.0000 mg | INTRAVENOUS | Status: DC | PRN

## 2022-08-26 MED ORDER — SODIUM CHLORIDE 0.9% FLUSH
3.0000 mL | Freq: Two times a day (BID) | INTRAVENOUS | Status: DC
  Administered 2022-08-26 – 2022-08-31 (×11): 3 mL via INTRAVENOUS

## 2022-08-26 MED ORDER — IOHEXOL 300 MG/ML  SOLN
100.0000 mL | Freq: Once | INTRAMUSCULAR | Status: AC | PRN
  Administered 2022-08-26: 100 mL via INTRAVENOUS

## 2022-08-26 MED ORDER — ONDANSETRON HCL 4 MG/2ML IJ SOLN: 4.0000 mg | Freq: Four times a day (QID) | INTRAMUSCULAR | Status: DC | PRN

## 2022-08-26 MED ORDER — IRBESARTAN 150 MG PO TABS
150.0000 mg | ORAL_TABLET | Freq: Every day | ORAL | Status: DC
  Administered 2022-08-27 – 2022-09-01 (×6): 150 mg via ORAL
  Filled 2022-08-26 (×6): qty 1

## 2022-08-26 MED ORDER — ONDANSETRON HCL 4 MG PO TABS: 4.0000 mg | ORAL_TABLET | Freq: Four times a day (QID) | ORAL | Status: DC | PRN

## 2022-08-26 MED ORDER — INSULIN ASPART 100 UNIT/ML IJ SOLN
0.0000 [IU] | Freq: Every day | INTRAMUSCULAR | Status: DC
  Filled 2022-08-26: qty 1

## 2022-08-26 MED ORDER — ACETAMINOPHEN 500 MG PO TABS
1000.0000 mg | ORAL_TABLET | Freq: Once | ORAL | Status: AC
  Administered 2022-08-26: 1000 mg via ORAL
  Filled 2022-08-26: qty 2

## 2022-08-26 MED ORDER — LACTATED RINGERS IV SOLN
150.0000 mL/h | INTRAVENOUS | Status: DC
  Administered 2022-08-26: 150 mL/h via INTRAVENOUS

## 2022-08-26 MED ORDER — SODIUM CHLORIDE 0.9 % IV SOLN: INTRAVENOUS | Status: DC

## 2022-08-26 MED ORDER — ZANUBRUTINIB 80 MG PO CAPS: 160.0000 mg | ORAL_CAPSULE | Freq: Two times a day (BID) | ORAL | Status: DC

## 2022-08-26 MED ORDER — NITROGLYCERIN 0.4 MG SL SUBL
0.8000 mg | SUBLINGUAL_TABLET | Freq: Once | SUBLINGUAL | Status: AC
  Administered 2022-08-26: 0.8 mg via SUBLINGUAL

## 2022-08-26 MED ORDER — LACTATED RINGERS IV BOLUS (SEPSIS): 500.0000 mL | Freq: Once | INTRAVENOUS | Status: DC

## 2022-08-26 MED ORDER — LACTATED RINGERS IV SOLN: INTRAVENOUS | Status: AC

## 2022-08-26 MED ORDER — OXYCODONE-ACETAMINOPHEN 5-325 MG PO TABS
1.0000 | ORAL_TABLET | Freq: Three times a day (TID) | ORAL | Status: DC | PRN
  Administered 2022-08-26 – 2022-09-01 (×7): 1 via ORAL
  Filled 2022-08-26 (×7): qty 1

## 2022-08-26 MED ORDER — ACETAMINOPHEN 650 MG RE SUPP: 650.0000 mg | Freq: Four times a day (QID) | RECTAL | Status: DC | PRN

## 2022-08-26 MED ORDER — PREGABALIN 50 MG PO CAPS
100.0000 mg | ORAL_CAPSULE | Freq: Two times a day (BID) | ORAL | Status: DC
  Administered 2022-08-26 – 2022-09-01 (×12): 100 mg via ORAL
  Filled 2022-08-26 (×12): qty 2

## 2022-08-26 MED ORDER — HYDRALAZINE HCL 50 MG PO TABS
50.0000 mg | ORAL_TABLET | Freq: Two times a day (BID) | ORAL | Status: DC
  Administered 2022-08-27 – 2022-09-01 (×10): 50 mg via ORAL
  Filled 2022-08-26 (×11): qty 1

## 2022-08-26 MED ORDER — IOHEXOL 350 MG/ML SOLN
100.0000 mL | Freq: Once | INTRAVENOUS | Status: AC | PRN
  Administered 2022-08-26: 100 mL via INTRAVENOUS

## 2022-08-26 MED ORDER — OXYCODONE HCL 5 MG PO TABS
5.0000 mg | ORAL_TABLET | Freq: Three times a day (TID) | ORAL | Status: DC | PRN
  Administered 2022-08-26 – 2022-09-01 (×4): 5 mg via ORAL
  Filled 2022-08-26 (×4): qty 1

## 2022-08-26 MED ORDER — INSULIN ASPART 100 UNIT/ML IJ SOLN
0.0000 [IU] | Freq: Three times a day (TID) | INTRAMUSCULAR | Status: DC
  Administered 2022-08-27 (×2): 2 [IU] via SUBCUTANEOUS
  Administered 2022-08-28: 3 [IU] via SUBCUTANEOUS
  Administered 2022-08-29 – 2022-08-31 (×4): 2 [IU] via SUBCUTANEOUS
  Filled 2022-08-26 (×7): qty 1

## 2022-08-26 MED ORDER — ACETAMINOPHEN 325 MG PO TABS
650.0000 mg | ORAL_TABLET | Freq: Four times a day (QID) | ORAL | Status: DC | PRN
  Administered 2022-08-27 – 2022-08-29 (×4): 650 mg via ORAL
  Filled 2022-08-26 (×4): qty 2

## 2022-08-26 MED ORDER — MORPHINE SULFATE (PF) 2 MG/ML IV SOLN
2.0000 mg | INTRAVENOUS | Status: DC | PRN
  Administered 2022-08-27 – 2022-08-31 (×10): 2 mg via INTRAVENOUS
  Filled 2022-08-26 (×10): qty 1

## 2022-08-26 MED ORDER — ENOXAPARIN SODIUM 60 MG/0.6ML IJ SOSY
0.5000 mg/kg | PREFILLED_SYRINGE | INTRAMUSCULAR | Status: DC
  Administered 2022-08-26 – 2022-08-29 (×4): 55 mg via SUBCUTANEOUS
  Filled 2022-08-26 (×4): qty 0.6

## 2022-08-26 MED ORDER — OXYCODONE-ACETAMINOPHEN 10-325 MG PO TABS: 1.0000 | ORAL_TABLET | Freq: Three times a day (TID) | ORAL | Status: DC | PRN

## 2022-08-26 MED ORDER — LEVOTHYROXINE SODIUM 100 MCG PO TABS
200.0000 ug | ORAL_TABLET | Freq: Every day | ORAL | Status: DC
  Administered 2022-08-27 – 2022-09-01 (×5): 200 ug via ORAL
  Filled 2022-08-26 (×6): qty 2

## 2022-08-26 MED ORDER — METOPROLOL TARTRATE 5 MG/5ML IV SOLN: 10.0000 mg | Freq: Once | INTRAVENOUS | Status: DC | PRN

## 2022-08-26 MED ORDER — DAPAGLIFLOZIN PROPANEDIOL 10 MG PO TABS
10.0000 mg | ORAL_TABLET | Freq: Every day | ORAL | Status: DC
  Administered 2022-08-27 – 2022-09-01 (×6): 10 mg via ORAL
  Filled 2022-08-26 (×6): qty 1

## 2022-08-26 MED FILL — Iron Sucrose Inj 20 MG/ML (Fe Equiv): INTRAVENOUS | Qty: 10 | Status: AC

## 2022-08-26 NOTE — Progress Notes (Signed)
Patient tolerated CT well. Drank water after. Vital signs stable encourage to drink water throughout day.Reasons explained and verbalized understanding.  

## 2022-08-26 NOTE — Assessment & Plan Note (Signed)
Hold metformin Continue Farxiga Sliding scale insulin coverage

## 2022-08-26 NOTE — Assessment & Plan Note (Signed)
Continue metoprolol and valsartan and hydralazine

## 2022-08-26 NOTE — Sepsis Progress Note (Signed)
Per Dr. Para March, no plan to order antibiotics based on lab results.  Plan to cancel code sepsis order.  Will continue to monitor until order is canceled.

## 2022-08-26 NOTE — Assessment & Plan Note (Signed)
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

## 2022-08-26 NOTE — Assessment & Plan Note (Signed)
Continue levothyroxine 

## 2022-08-26 NOTE — Assessment & Plan Note (Signed)
Hemoglobin at baseline at 8.1

## 2022-08-26 NOTE — H&P (Addendum)
History and Physical    Patient: Paul Briggs VZD:[id number] DOB: [birth year] DOA: 08/26/2022 DOS: the patient was seen and examined on 08/26/2022 PCP: Wilford Corner, PA-C  Patient coming from: Home  Chief Complaint:  Chief Complaint  Patient presents with   Head Injury    Patient backwards from standing and hit head on grass ground, no loc. Patient awoke this morning to a unknown "knot" to left upper thigh into groin area. Patient states he felt bad after fall so he went to sleep but feels good today    HPI: Paul Russom [name] is a 77 y.o. 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  He denied preceding palpitations, chest pain or shortness of breath, headache or visual disturbance or one-sided weakness numbness or tingling.  He complains of a painful knot felt to the left upper thigh and groin area that was not there prior to the fall.  He has had no vomiting or diarrhea and no black or bloody stool. No fevers. ED course and data review : Vitals within normal limits.  Labs with hemoglobin 8.1 fairly similar to 8.3 when discharged 2 days prior.  WBC 13,000 up from 8.3 on 7/30.  BMP unremarkable.  Troponin 49. EKG, personally viewed and interpreted showing NSR at 63 with known LBBB Chest x-ray not done Trauma imaging including x-ray hip, CT head and C-spine nonacute.  Shows left frontal burr hole  craniotomy CT abdomen and pelvis showing subcutaneous fat stranding likely related to contusion without fluid collection or hematoma as follows: IMPRESSION: 1. Subcutaneous fat stranding within the left lower quadrant anterior abdominal wall extending into the anterior proximal left thigh, likely related to contusion given history of recent trauma. No fluid collection or hematoma. 2. No acute displaced fracture. 3. Retroperitoneal and pelvic lymphadenopathy as above, with waxing and waning appearance. 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 4. 1.9 cm hypodensity within the medial aspect of the spleen, nonspecific. Leukemic involvement of the spleen cannot be excluded. 5. Distal colonic diverticulosis without diverticulitis.  Patient given Tylenol and hospitalist consulted for admission.for elevated troponin and concern for EKG changes as well as intractable pain   Review of Systems: As mentioned in the history of present illness. All other systems reviewed and are negative.  Past Medical History:  Diagnosis Date   Acute prostatitis    BPH (benign prostatic hyperplasia)    Chronic kidney disease    CLL (chronic lymphocytic leukemia) (HCC)    Diabetes (HCC)    Dysplasia of prostate    Erectile dysfunction    HTN (hypertension)    Hypogonadism in male    Orchitis and epididymitis    Over weight    Palindromic rheumatism, hand    Prostatitis    Rectum pain    Testicle swelling    Testicle tenderness    Testicular mass    Past Surgical History:  Procedure Laterality Date   BACK SURGERY     BIOPSY  08/21/2022  Procedure: BIOPSY;  Surgeon: Jaynie Collins, DO;  Location: [facility] ENDOSCOPY;  Service: Gastroenterology;;   BIOPSY  08/23/2022   Procedure: BIOPSY;  Surgeon: Jaynie Collins, DO;  Location: [facility] ENDOSCOPY;  Service: Gastroenterology;;   COLONOSCOPY WITH PROPOFOL N/A 08/23/2022   Procedure: COLONOSCOPY WITH PROPOFOL;  Surgeon: Jaynie Collins,  DO;  Location: [facility] ENDOSCOPY;  Service: Gastroenterology;  Laterality: N/A;   ESOPHAGOGASTRODUODENOSCOPY (EGD) WITH PROPOFOL N/A 08/21/2022   Procedure: ESOPHAGOGASTRODUODENOSCOPY (EGD) WITH PROPOFOL;  Surgeon: Jaynie Collins, DO;  Location: [facility] ENDOSCOPY;  Service: Gastroenterology;  Laterality: N/A;   GIVENS CAPSULE STUDY N/A 08/23/2022   Procedure: GIVENS CAPSULE STUDY;  Surgeon: Jaynie Collins, DO;  Location: [facility] ENDOSCOPY;  Service: Gastroenterology;  Laterality: N/A;   head surgery     from fall  drilled hole in brain to relieve pressure   POLYPECTOMY  08/23/2022   Procedure: POLYPECTOMY;  Surgeon: Jaynie Collins, DO;  Location: [facility] ENDOSCOPY;  Service: Gastroenterology;;   SUBMUCOSAL TATTOO INJECTION  08/23/2022   Procedure: SUBMUCOSAL TATTOO INJECTION;  Surgeon: Jaynie Collins, DO;  Location: [facility] ENDOSCOPY;  Service: Gastroenterology;;   TONSILLECTOMY     Social History:  reports that he has quit smoking. He has never used smokeless tobacco. He reports that he does not drink alcohol and does not use drugs.  No Known Allergies  Family History  Problem Relation Age of Onset   Cancer Maternal Aunt    Prostate cancer Neg Hx    Kidney disease Neg Hx    Kidney cancer Neg Hx    Bladder Cancer Neg Hx     Prior to Admission medications   Medication Sig Start Date End Date Taking? Authorizing Provider  aspirin EC 81 MG tablet Take 1 tablet (81 mg total) by mouth daily. Swallow whole. 08/09/22   Furth, Cadence H, PA-C  dapagliflozin propanediol (FARXIGA) 10 MG TABS tablet Take 1 tablet (10 mg total) by mouth daily before breakfast. 08/09/22   Furth, Cadence H, PA-C  hydrALAZINE (APRESOLINE) 50 MG tablet Take 1 tablet (50 mg total) by mouth in the morning and at bedtime. 07/28/22 10/26/22  Debbe Odea, MD  levothyroxine (SYNTHROID) 200 MCG tablet Take 200 mcg by mouth daily. 01/22/22   [provider]  metFORMIN (GLUCOPHAGE) 500 MG tablet Take 500  mg by mouth 2 (two) times daily with a meal.    [provider]  metoprolol succinate (TOPROL XL) 25 MG 24 hr tablet Take 1 tablet (25 mg total) by mouth daily. 08/09/22   Furth, Cadence H, PA-C  oxyCODONE-acetaminophen (PERCOCET) 10-325 MG tablet Take 1 tablet by mouth every 8 (eight) hours as needed. 08/23/22 09/22/22  Edward Jolly, MD  oxyCODONE-acetaminophen (PERCOCET) 10-325 MG tablet Take 1 tablet by mouth every 8 (eight) hours as needed. 09/22/22 10/22/22  Edward Jolly, MD  oxyCODONE-acetaminophen (PERCOCET) 10-325 MG tablet Take 1 tablet by mouth every 8 (eight) hours as needed. 10/22/22 11/21/22  Edward Jolly, MD  pregabalin (LYRICA) 100 MG capsule Take 1 capsule (100 mg total) by mouth 2 (two) times daily. 05/25/22 11/21/22  Edward Jolly, MD  rosuvastatin (CRESTOR) 40 MG tablet Take 1 tablet (40 mg total) by mouth daily. 08/09/22 11/07/22  Furth, Cadence H, PA-C  trospium (SANCTURA) 20 MG tablet Take 1 tablet (20 mg total) by mouth 2 (two) times daily. 03/30/22   Michiel Cowboy A, PA-C  valsartan (DIOVAN) 160 MG tablet Take 160 mg by mouth daily. 08/18/22   [provider]  zanubrutinib (BRUKINSA) 80  MG capsule Take 2 capsules (160 mg total) by mouth 2 (two) times daily. 08/06/22   [name], MD    Physical Exam: Vitals:   08/26/22 1141 08/26/22 1145 08/26/22 1230 08/26/22 1534  BP: 135/88   130/84  Pulse: 66   70  Resp: 16   16  Temp: 99.1 F (37.3 C)   98.9 F (37.2 C)  TempSrc: Oral   Oral  SpO2: 94% 95%  95%  Weight:   107.5 kg   Height:   6\' 1"  (1.854 m)    Physical Exam Vitals and nursing note reviewed.  Constitutional:      General: He is not in acute distress. HENT:     Head: Normocephalic and atraumatic.  Cardiovascular:     Rate and Rhythm: Normal rate and regular rhythm.     Heart sounds: Normal heart sounds.  Pulmonary:     Effort: Pulmonary effort is normal.     Breath sounds: Normal breath sounds.  Abdominal:     Palpations: Abdomen is soft.      Tenderness: There is no abdominal tenderness.  Musculoskeletal:     Comments: See pic below. Tender dome shaped swelling upper inner left thigh/groin with erythema  Neurological:     Mental Status: Mental status is at baseline.     Labs on Admission: I have personally reviewed following labs and imaging studies  CBC: Recent Labs  Lab 08/20/22 1231 08/20/22 1350 08/21/22 0216 08/21/22 1220 08/22/22 0052 08/22/22 0412 08/23/22 0502 08/24/22 0419 08/26/22 1146  WBC 7.0   < > 6.4  --   --  7.4 7.7 8.3 13.2*  NEUTROABS 2.5  --   --   --   --   --   --  5.4 9.0*  HGB 6.4*   < > 6.4*   < > 8.3* 8.2* 8.7* 8.3* 8.1*  HCT 20.9*   < > 20.5*   < > 26.4* 25.8* 27.3* 25.9* 26.2*  MCV 91.3   < > 90.7  --   --  88.4 90.1 89.6 91.6  PLT 150   < > 147*  --   --  143* 151 157 158   < > = values in this interval not displayed.   Basic Metabolic Panel: Recent Labs  Lab 08/21/22 0216 08/22/22 0412 08/23/22 0502 08/24/22 0419 08/26/22 1146  NA 137 141 142 141 137  K 3.2* 3.7 4.1 3.7 3.6  CL 110 112* 111 112* 104  CO2 20* 23 24 23 24   GLUCOSE 118* 92 103* 148* 163*  BUN 14 9 8 14 12   CREATININE 1.13 0.99 1.10 1.12 1.15  CALCIUM 7.7* 7.9* 8.2* 8.1* 8.1*   GFR: Estimated Creatinine Clearance: 70.3 mL/min (by C-G formula based on SCr of 1.15 mg/dL). Liver Function Tests: Recent Labs  Lab 08/20/22 1350  AST 61*  ALT 23  ALKPHOS 44  BILITOT 1.0  PROT 6.1*  ALBUMIN 3.4*   No results for input(s): "LIPASE", "AMYLASE" in the last 168 hours. No results for input(s): "AMMONIA" in the last 168 hours. Coagulation Profile: No results for input(s): "INR", "PROTIME" in the last 168 hours. Cardiac Enzymes: No results for input(s): "CKTOTAL", "CKMB", "CKMBINDEX", "TROPONINI" in the last 168 hours. BNP (last 3 results) No results for input(s): "PROBNP" in the last 8760 hours. HbA1C: No results for input(s): "HGBA1C" in the last 72 hours. CBG: Recent Labs  Lab 08/23/22 0758  08/23/22 1223 08/23/22 1631 08/23/22 2032 08/24/22 0803  GLUCAP 84 96  172* 152* 92   Lipid Profile: No results for input(s): "CHOL", "HDL", "LDLCALC", "TRIG", "CHOLHDL", "LDLDIRECT" in the last 72 hours. Thyroid Function Tests: No results for input(s): "TSH", "T4TOTAL", "FREET4", "T3FREE", "THYROIDAB" in the last 72 hours. Anemia Panel: No results for input(s): "VITAMINB12", "FOLATE", "FERRITIN", "TIBC", "IRON", "RETICCTPCT" in the last 72 hours. Urine analysis:    Component Value Date/Time   COLORURINE YELLOW (A) 08/21/2022 1345   APPEARANCEUR CLEAR (A) 08/21/2022 1345   APPEARANCEUR Clear 10/04/2019 1128   LABSPEC 1.018 08/21/2022 1345   LABSPEC 1.019 06/16/2013 1607   PHURINE 5.0 08/21/2022 1345   GLUCOSEU NEGATIVE 08/21/2022 1345   GLUCOSEU Negative 06/16/2013 1607   HGBUR NEGATIVE 08/21/2022 1345   BILIRUBINUR NEGATIVE 08/21/2022 1345   BILIRUBINUR Negative 07/24/2020 1013   BILIRUBINUR Negative 10/04/2019 1128   BILIRUBINUR Negative 06/16/2013 1607   KETONESUR NEGATIVE 08/21/2022 1345   PROTEINUR 30 (A) 08/21/2022 1345   UROBILINOGEN 0.2 07/24/2020 1013   NITRITE NEGATIVE 08/21/2022 1345   LEUKOCYTESUR NEGATIVE 08/21/2022 1345   LEUKOCYTESUR Negative 06/16/2013 1607    Radiological Exams on Admission: CT ABDOMEN PELVIS W CONTRAST  Result Date: 08/26/2022 CLINICAL DATA:  Loss of balance, fell, abdominal discomfort and swelling, history of CLL EXAM: CT ABDOMEN AND PELVIS WITH CONTRAST TECHNIQUE: Multidetector CT imaging of the abdomen and pelvis was performed using the standard protocol following bolus administration of intravenous contrast. RADIATION DOSE REDUCTION: This exam was performed according to the departmental dose-optimization program which includes automated exposure control, adjustment of the mA and/or kV according to patient size and/or use of iterative reconstruction technique. CONTRAST:  OMNIPAQUE IOHEXOL 300 MG/ML  SOLN COMPARISON:  01/04/2022  FINDINGS: Lower chest: Cardiomegaly without pericardial effusion. No acute pleural or parenchymal lung disease. Hepatobiliary: No hepatic injury or perihepatic hematoma. Gallbladder is unremarkable. Pancreas: Unremarkable. No pancreatic ductal dilatation or surrounding inflammatory changes. Spleen: 1.9 cm hypodensity within the medial aspect of the spleen image 17/2 is nonspecific. The remainder of the spleen is unremarkable. No evidence of splenomegaly. Adrenals/Urinary Tract: Excreted contrast within the kidneys from CT coronary angiogram performed earlier today. Multiple bilateral renal cortical cysts do not require specific imaging follow-up. No obstructive uropathy within either kidney. No filling defects within the bladder. 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 Stomach/Bowel: No bowel obstruction or ileus. Normal appendix right lower quadrant. Scattered distal colonic diverticulosis without diverticulitis. No bowel wall thickening or inflammatory change. Vascular/Lymphatic: Stable aortic atherosclerosis. Continued retroperitoneal and pelvic adenopathy. Index lymph nodes are as follows: Left external iliac, image 76/2, 2.9 cm in short axis. Previously 2.6 cm. Right external iliac, image 73/2, 1.0 cm in short axis. Previously 1.7 cm. Right external iliac, image 67/2, 1.5 cm in short axis. Previously 2.1 cm. No new adenopathy. Reproductive: Prostate is unremarkable. Other: No free fluid or free intraperitoneal gas. No abdominal wall hernia. Musculoskeletal: There is subcutaneous fat stranding within the anterior aspect of the proximal left thigh, extending into the left lower quadrant anterior abdominal wall, which may reflect contusion after trauma. There is no fluid collection or hematoma. There are no acute displaced fractures. Reconstructed images demonstrate no additional findings. IMPRESSION: 1. Subcutaneous fat stranding within the left lower quadrant anterior abdominal wall  extending into the anterior proximal left thigh, likely related to contusion given history of recent trauma. No fluid collection or hematoma. 2. No acute displaced fracture. 3. Retroperitoneal and pelvic lymphadenopathy as above, with waxing and waning appearance. 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  4. 1.9 cm hypodensity within the medial aspect of the spleen, nonspecific. Leukemic involvement of the spleen cannot be excluded. 5. Distal colonic diverticulosis without diverticulitis. 6.  Aortic Atherosclerosis (ICD10-I70.0). Electronically Signed   By: Sharlet Salina M.D.   On: 08/26/2022 19:54   CT Head Wo Contrast  Result Date: 08/26/2022 CLINICAL DATA:  Fall EXAM: CT HEAD WITHOUT CONTRAST CT CERVICAL SPINE WITHOUT CONTRAST TECHNIQUE: Multidetector CT imaging of the head and cervical spine was performed following the standard protocol without intravenous contrast. Multiplanar CT image reconstructions of the cervical spine were also generated. RADIATION DOSE REDUCTION: This exam was performed according to the departmental dose-optimization program which includes automated exposure control, adjustment of the mA and/or kV according to patient size and/or use of iterative reconstruction technique. COMPARISON:  None Available. FINDINGS: CT HEAD FINDINGS Brain: No evidence of acute infarction, hemorrhage, hydrocephalus, extra-axial collection or mass lesion/mass effect. Vascular: No hyperdense vessel or unexpected calcification. Skull: Status post left frontal craniotomy negative for fracture or focal lesion. Sinuses/Orbits: Mucosal thickening and small air-fluid level of the right maxillary sinus (series 2, image 4) Other: None. CT CERVICAL SPINE FINDINGS Alignment: Degenerative straightening and reversal of the normal cervical lordosis Skull base and vertebrae: No acute fracture. No primary bone lesion or focal pathologic process. Soft tissues and spinal canal: No prevertebral fluid or swelling. No  visible canal hematoma. Disc levels: Moderate to severe multilevel disc space height loss and osteophytosis throughout the cervical spine. Upper chest: Negative. Other: None. IMPRESSION: 1. No acute intracranial pathology. 2. Status post left frontal burr hole craniotomy. 3. Mucosal thickening and small air-fluid level of the right maxillary sinus. Correlate for acute sinusitis. 4. No fracture or static subluxation of the cervical spine. 5. 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 Electronically Signed   By: Jearld Lesch M.D.   On: 08/26/2022 14:50   CT Cervical Spine Wo Contrast  Result Date: 08/26/2022 CLINICAL DATA:  Fall EXAM: CT HEAD WITHOUT CONTRAST CT CERVICAL SPINE WITHOUT CONTRAST TECHNIQUE: Multidetector CT imaging of the head and cervical spine was performed following the standard protocol without intravenous contrast. Multiplanar CT image reconstructions of the cervical spine were also generated. RADIATION DOSE REDUCTION: This exam was performed according to the departmental dose-optimization program which includes automated exposure control, adjustment of the mA and/or kV according to patient size and/or use of iterative reconstruction technique. COMPARISON:  None Available. FINDINGS: CT HEAD FINDINGS Brain: No evidence of acute infarction, hemorrhage, hydrocephalus, extra-axial collection or mass lesion/mass effect. Vascular: No hyperdense vessel or unexpected calcification. Skull: Status post left frontal craniotomy negative for fracture or focal lesion. Sinuses/Orbits: Mucosal thickening and small air-fluid level of the right maxillary sinus (series 2, image 4) Other: None. CT CERVICAL SPINE FINDINGS Alignment: Degenerative straightening and reversal of the normal cervical lordosis Skull base and vertebrae: No acute fracture. No primary bone lesion or focal pathologic process. Soft tissues and spinal canal: No prevertebral fluid or swelling. No visible canal hematoma.  Disc levels: Moderate to severe multilevel disc space height loss and osteophytosis throughout the cervical spine. Upper chest: Negative. Other: None. IMPRESSION: 1. No acute intracranial pathology. 2. Status post left frontal burr hole craniotomy. 3. Mucosal thickening and small air-fluid level of the right maxillary sinus. Correlate for acute sinusitis. 4. No fracture or static subluxation of the cervical spine. 5. 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 Electronically Signed   By: Jearld Lesch M.D.   On: 08/26/2022 14:50   DG Hip  Unilat W or Wo Pelvis 2-3 Views Left  Result Date: 08/26/2022 CLINICAL DATA:  Pain after fall EXAM: DG HIP (WITH OR WITHOUT PELVIS) 3V LEFT COMPARISON:  None Available. FINDINGS: There is contrast in the bladder and distal ureters. Hyperostosis. Slight joint space loss of the right hip. No fracture or dislocation. Scattered vascular calcifications. IMPRESSION: Mild degenerative changes. Electronically Signed   By: Karen Kays M.D.   On: 08/26/2022 14:23   CT CORONARY MORPH W/CTA COR W/SCORE W/CA W/CM &/OR WO/CM  Result Date: 08/26/2022 CLINICAL DATA:  Dyspnea on exertion EXAM: Cardiac/Coronary  CTA TECHNIQUE: The patient was scanned on a Siemens Somatom go.Top scanner. : A retrospective scan was triggered in the ascending thoracic aorta. Axial non-contrast 3 mm slices were carried out through the heart. The data set was analyzed on a dedicated work station and scored using the Agatson method. Gantry rotation speed was 330 msecs and collimation was .6 mm. 100mg  of metoprolol and 0.8 mg of sl NTG was given. The 3D data set was reconstructed in 5% intervals of the 60-95 % of the R-R cycle. Diastolic phases were analyzed on a dedicated work station using MPR, MIP and VRT modes. The patient received 100 cc of contrast. FINDINGS: Aorta: Normal size. Aortic atherosclerosis calcifications. No dissection. Aortic Valve:  Trileaflet.  No calcifications. Coronary  Arteries:  Normal coronary origin.  Right dominance. RCA is a dominant artery. There is no plaque. Left main gives rise to LAD and LCX arteries. LM has no disease. LAD has calcified plaque proximally causing minimal stenosis (<25%). LCX is a non-dominant artery.  There is no plaque. Other findings: Normal pulmonary vein drainage into the left atrium. Normal left atrial appendage without a thrombus. Normal size of the pulmonary artery. IMPRESSION: 1. Coronary calcium score of 14.3. This was 12th percentile for age and sex matched control. 2. Normal coronary origin with right dominance. 3. Minimal proximal LAD stenosis (<25%). 4. CAD-RADS 1. Minimal non-obstructive CAD (0-24%). Consider preventive therapy and risk factor modification. Electronically Signed   By: Debbe Odea M.D.   On: 08/26/2022 13:58     Data Reviewed: Relevant notes from primary care and specialist visits, past discharge summaries as available in EHR, including Care Everywhere. 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  Blood cultures ordered Continue to monitor closely for sepsis physiology Antibiotics for cellulitis started Ultrasound left groin ordered Ice packs to groin Follow-up  procalcitonin  Contusion of groin, left, initial encounter CT :Subcutaneous fat stranding within the left lower quadrant anterior abdominal wall extending into the anterior proximal left thigh, likely related to contusion given history of recent trauma.No fluid collection or hematoma Cool compresses Pain control Treating as cellulitis given fever of 100.9 following admission  Elevated troponin LBBB, known HFmrEF (mildly reduced EF )2/2 cardiomyopathy (EF 45-50% 07/2022), CAD on coronary CTA Last seen by cardiology 7/15 s/p  workup for LBBB, started on Toprol to replace amlodipine Continue to trend troponin EKG was concerning to EDP Cardiology consult  Frequent falls Possible presyncope Patient had recent echo in July 2024 to evaluate her new LBBB and showed cardiomyopathy with EF 45 to 50% and G1 DD Continuous cardiac monitoring Check orthostatics Ensure adequate hydration.  Patient had colonoscopy on 7/29 so could possibly be dehydrated Cardiology consult for any additional recommendations PT eval  Essential hypertension Continue metoprolol and valsartan and hydralazine  Type II diabetes mellitus (HCC) Hold metformin Continue Farxiga Sliding scale insulin coverage  Hypothyroidism Continue levothyroxine  IDA (iron deficiency anemia) Hemoglobin at baseline at 8.1  CLL (chronic lymphocytic leukemia) (HCC) Followed by oncology. 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 Observation status is judged to be reasonable and necessary in order to provide the required intensity of service to ensure  the patient's safety. The patient's presenting symptoms, physical exam findings, and initial radiographic and laboratory data in the context of their medical condition is felt to place them at decreased risk for further clinical deterioration. Furthermore, it is anticipated that the patient will be medically stable for discharge from the hospital within 2 midnights of admission.   Author: Andris Baumann, MD 08/26/2022 9:20 PM  For on call review [email or web address].

## 2022-08-26 NOTE — Assessment & Plan Note (Addendum)
Possible presyncope Patient had recent echo in July 2024 to evaluate her new LBBB and showed cardiomyopathy with EF 45 to 50% and G1 DD Continuous cardiac monitoring Check orthostatics Ensure adequate hydration.  Patient had colonoscopy on 7/29 so could possibly be dehydrated Cardiology consult for any additional recommendations PT eval

## 2022-08-26 NOTE — Assessment & Plan Note (Signed)
CPAP nightly

## 2022-08-26 NOTE — Progress Notes (Signed)
Anticoagulation monitoring(Lovenox):  77 yo  male ordered Lovenox 40 mg Q24h    Filed Weights   08/26/22 1230  Weight: 107.5 kg (236 lb 15.9 oz)   BMI 31.3    Lab Results  Component Value Date   CREATININE 1.15 08/26/2022   CREATININE 1.12 08/24/2022   CREATININE 1.10 08/23/2022   Estimated Creatinine Clearance: 70.3 mL/min (by C-G formula based on SCr of 1.15 mg/dL). Hemoglobin & Hematocrit     Component Value Date/Time   HGB 8.1 (L) 08/26/2022 1146   HGB 6.4 (LL) 08/20/2022 1231   HGB 11.8 (L) 10/06/2013 1441   HCT 26.2 (L) 08/26/2022 1146   HCT 38.9 09/18/2014 1000     Per Protocol for Patient with estCrcl > 30 ml/min and BMI > 30, will transition to Lovenox 55 mg Q24h.

## 2022-08-26 NOTE — Assessment & Plan Note (Addendum)
CT :Subcutaneous fat stranding within the left lower quadrant anterior abdominal wall extending into the anterior proximal left thigh, likely related to contusion given history of recent trauma.No fluid collection or hematoma Cool compresses Pain control Treating as cellulitis given fever of 100.9 following admission

## 2022-08-26 NOTE — ED Triage Notes (Signed)
Patient backwards from standing and hit head on grass ground, no loc. Patient awoke this morning to a unknown "knot" to left upper thigh into groin area. Patient states he felt bad after fall so he went to sleep but feels good today

## 2022-08-26 NOTE — ED Notes (Signed)
See triage note  Presents with family  States he lost his balance  fell backwards  Hitting the back of his head  No LOC   Also having some discomfort and swelling to left hip/groin area

## 2022-08-26 NOTE — ED Notes (Signed)
This nurse has been in to speak with family  Daughter states she has not seen a doctor  I explained the delay at that time

## 2022-08-26 NOTE — ED Provider Notes (Signed)
[facility] Provider Note   Event Date/Time   First MD Initiated Contact with Patient 08/26/22 1216     (approximate) History  Head Injury (Patient backwards from standing and hit head on grass ground, no loc. Patient awoke this morning to a unknown "knot" to left upper thigh into groin area. Patient states he felt bad after fall so he went to sleep but feels good today)  HPI Paul Ozment [name] is a 77 y.o. male with a stated past medical history of hypertension and diabetes who presents after mechanical fall from standing falling backwards hitting his head on the grass.  Patient denies any LOC.  Patient states that when he woke up this morning he had a large knot to the left inguinal region.  Patient states that he has no history of inguinal hernia that he knows of.  Patient states that this area is extremely tender and swollen with a 10/10 burning/aching pain along that area that is worse with palpation and walking ROS: Patient currently denies any vision changes, tinnitus, difficulty speaking, facial droop, sore throat, chest pain, shortness of breath, abdominal pain, nausea/vomiting/diarrhea, dysuria, or weakness/numbness/paresthesias in any extremity   Physical Exam  Triage Vital Signs: ED Triage Vitals  Encounter Vitals Group     BP 08/26/22 1141 135/88     Systolic BP Percentile --      Diastolic BP Percentile --      Pulse Rate 08/26/22 1141 66     Resp 08/26/22 1141 16     Temp 08/26/22 1141 99.1 F (37.3 C)     Temp Source 08/26/22 1141 Oral     SpO2 08/26/22 1141 94 %     Weight 08/26/22 1230 236 lb 15.9 oz (107.5 kg)     Height 08/26/22 1230 6\' 1"  (1.854 m)     Head Circumference --      Peak Flow --      Pain Score 08/26/22 1534 2     Pain Loc --      Pain Education --      Exclude from Growth Chart --    Most recent vital signs: Vitals:   08/26/22 2315 08/27/22 0443  BP: (!) 153/67 (!) 170/91  Pulse: 72 64  Resp: 19 17  Temp: 98.9  F (37.2 C) 99.6 F (37.6 C)  SpO2: 96% 97%   General: Awake, oriented x4. CV:  Good peripheral perfusion.  Resp:  Normal effort.  Abd:  No distention.  Other:  Elderly overweight African-American male laying in bed in mild distress secondary to pain.  There is a significant swelling and induration to the left inguinal region with exquisite tenderness on palpation. ED Results / Procedures / Treatments  Labs (all labs ordered are listed, but only abnormal results are displayed) Labs Reviewed  CBC WITH DIFFERENTIAL/PLATELET - Abnormal; Notable for the following components:      Result Value   WBC 13.2 (*)    RBC 2.86 (*)    Hemoglobin 8.1 (*)    HCT 26.2 (*)    RDW 17.9 (*)    Neutro Abs 9.0 (*)    Monocytes Absolute 1.8 (*)    Abs Immature Granulocytes 0.10 (*)    All other components within normal limits  BASIC METABOLIC PANEL - Abnormal; Notable for the following components:   Glucose, Bld 163 (*)    Calcium 8.1 (*)    All other components within normal limits  URINALYSIS, COMPLETE (UACMP) WITH MICROSCOPIC -  Abnormal; Notable for the following components:   Color, Urine YELLOW (*)    APPearance CLEAR (*)    Specific Gravity, Urine >1.046 (*)    Ketones, ur 20 (*)    Protein, ur 30 (*)    All other components within normal limits  PROTIME-INR - Abnormal; Notable for the following components:   Prothrombin Time 16.9 (*)    INR 1.4 (*)    All other components within normal limits  APTT - Abnormal; Notable for the following components:   aPTT 42 (*)    All other components within normal limits  TROPONIN I (HIGH SENSITIVITY) - Abnormal; Notable for the following components:   Troponin I (High Sensitivity) 49 (*)    All other components within normal limits  TROPONIN I (HIGH SENSITIVITY) - Abnormal; Notable for the following components:   Troponin I (High Sensitivity) 58 (*)    All other components within normal limits  RESP PANEL BY RT-PCR (RSV, FLU A&B, COVID)  [id number]   CULTURE, BLOOD (ROUTINE X 2)  CULTURE, BLOOD (ROUTINE X 2)  URINE CULTURE  LACTIC ACID, PLASMA  LACTIC ACID, PLASMA  PROCALCITONIN  RADIOLOGY ED MD interpretation: CT of the head without contrast interpreted by me shows no evidence of acute abnormalities including no intracerebral hemorrhage, obvious masses, or significant edema  CT of the cervical spine interpreted by me does not show any evidence of acute abnormalities including no acute fracture, malalignment, height loss, or dislocation  One-view portable chest x-ray interpreted by me shows no evidence of acute abnormalities including no pneumonia, pneumothorax, or widened mediastinum -Agree with radiology assessment Official radiology report(s): DG Chest Port 1 View  Result Date: 08/26/2022 CLINICAL DATA:  Frequent falls EXAM: PORTABLE CHEST 1 VIEW COMPARISON:  Chest x-ray 10/10/2014 FINDINGS: The heart is enlarged, unchanged. The lungs are clear. There is no pleural effusion or pneumothorax. No acute fractures are seen. IMPRESSION: 1. No active disease. 2. Cardiomegaly. Electronically Signed   By: Darliss Cheney M.D.   On: 08/26/2022 22:45   CT ABDOMEN PELVIS W CONTRAST  Result Date: 08/26/2022 CLINICAL DATA:  Loss of balance, fell, abdominal discomfort and swelling, history of CLL EXAM: CT ABDOMEN AND PELVIS WITH CONTRAST TECHNIQUE: Multidetector CT imaging of the abdomen and pelvis was performed using the standard protocol following bolus administration of intravenous contrast. RADIATION DOSE REDUCTION: This exam was performed according to the departmental dose-optimization program which includes automated exposure control, adjustment of the mA and/or kV according to patient size and/or use of iterative reconstruction technique. CONTRAST:  OMNIPAQUE IOHEXOL 300 MG/ML  SOLN COMPARISON:  01/04/2022 FINDINGS: Lower chest: Cardiomegaly without pericardial effusion. No acute pleural or parenchymal lung disease. Hepatobiliary: No hepatic  injury or perihepatic hematoma. Gallbladder is unremarkable. Pancreas: Unremarkable. No pancreatic ductal dilatation or surrounding inflammatory changes. Spleen: 1.9 cm hypodensity within the medial aspect of the spleen image 17/2 is nonspecific. The remainder of the spleen is unremarkable. No evidence of splenomegaly. Adrenals/Urinary Tract: Excreted contrast within the kidneys from CT coronary angiogram performed earlier today. Multiple bilateral renal cortical cysts do not require specific imaging follow-up. No obstructive uropathy within either kidney. No filling defects within the bladder. 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 Stomach/Bowel: No bowel obstruction or ileus. Normal appendix right lower quadrant. Scattered distal colonic diverticulosis without diverticulitis. No bowel wall thickening or inflammatory change. Vascular/Lymphatic: Stable aortic atherosclerosis. Continued retroperitoneal and pelvic adenopathy. Index lymph nodes are as follows: Left external iliac, image 76/2, 2.9  cm in short axis. Previously 2.6 cm. Right external iliac, image 73/2, 1.0 cm in short axis. Previously 1.7 cm. Right external iliac, image 67/2, 1.5 cm in short axis. Previously 2.1 cm. No new adenopathy. Reproductive: Prostate is unremarkable. Other: No free fluid or free intraperitoneal gas. No abdominal wall hernia. Musculoskeletal: There is subcutaneous fat stranding within the anterior aspect of the proximal left thigh, extending into the left lower quadrant anterior abdominal wall, which Paul reflect contusion after trauma. There is no fluid collection or hematoma. There are no acute displaced fractures. Reconstructed images demonstrate no additional findings. IMPRESSION: 1. Subcutaneous fat stranding within the left lower quadrant anterior abdominal wall extending into the anterior proximal left thigh, likely related to contusion given history of recent trauma. No fluid collection or  hematoma. 2. No acute displaced fracture. 3. Retroperitoneal and pelvic lymphadenopathy as above, with waxing and waning appearance. 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 4. 1.9 cm hypodensity within the medial aspect of the spleen, nonspecific. Leukemic involvement of the spleen cannot be excluded. 5. Distal colonic diverticulosis without diverticulitis. 6.  Aortic Atherosclerosis (ICD10-I70.0). Electronically Signed   By: Sharlet Salina M.D.   On: 08/26/2022 19:54   CT Head Wo Contrast  Result Date: 08/26/2022 CLINICAL DATA:  Fall EXAM: CT HEAD WITHOUT CONTRAST CT CERVICAL SPINE WITHOUT CONTRAST TECHNIQUE: Multidetector CT imaging of the head and cervical spine was performed following the standard protocol without intravenous contrast. Multiplanar CT image reconstructions of the cervical spine were also generated. RADIATION DOSE REDUCTION: This exam was performed according to the departmental dose-optimization program which includes automated exposure control, adjustment of the mA and/or kV according to patient size and/or use of iterative reconstruction technique. COMPARISON:  None Available. FINDINGS: CT HEAD FINDINGS Brain: No evidence of acute infarction, hemorrhage, hydrocephalus, extra-axial collection or mass lesion/mass effect. Vascular: No hyperdense vessel or unexpected calcification. Skull: Status post left frontal craniotomy negative for fracture or focal lesion. Sinuses/Orbits: Mucosal thickening and small air-fluid level of the right maxillary sinus (series 2, image 4) Other: None. CT CERVICAL SPINE FINDINGS Alignment: Degenerative straightening and reversal of the normal cervical lordosis Skull base and vertebrae: No acute fracture. No primary bone lesion or focal pathologic process. Soft tissues and spinal canal: No prevertebral fluid or swelling. No visible canal hematoma. Disc levels: Moderate to severe multilevel disc space height loss and osteophytosis throughout the cervical  spine. Upper chest: Negative. Other: None. IMPRESSION: 1. No acute intracranial pathology. 2. Status post left frontal burr hole craniotomy. 3. Mucosal thickening and small air-fluid level of the right maxillary sinus. Correlate for acute sinusitis. 4. No fracture or static subluxation of the cervical spine. 5. 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 Electronically Signed   By: Jearld Lesch M.D.   On: 08/26/2022 14:50   CT Cervical Spine Wo Contrast  Result Date: 08/26/2022 CLINICAL DATA:  Fall EXAM: CT HEAD WITHOUT CONTRAST CT CERVICAL SPINE WITHOUT CONTRAST TECHNIQUE: Multidetector CT imaging of the head and cervical spine was performed following the standard protocol without intravenous contrast. Multiplanar CT image reconstructions of the cervical spine were also generated. RADIATION DOSE REDUCTION: This exam was performed according to the departmental dose-optimization program which includes automated exposure control, adjustment of the mA and/or kV according to patient size and/or use of iterative reconstruction technique. COMPARISON:  None Available. FINDINGS: CT HEAD FINDINGS Brain: No evidence of acute infarction, hemorrhage, hydrocephalus, extra-axial collection or mass lesion/mass effect. Vascular: No hyperdense vessel or unexpected calcification. Skull:  Status post left frontal craniotomy negative for fracture or focal lesion. Sinuses/Orbits: Mucosal thickening and small air-fluid level of the right maxillary sinus (series 2, image 4) Other: None. CT CERVICAL SPINE FINDINGS Alignment: Degenerative straightening and reversal of the normal cervical lordosis Skull base and vertebrae: No acute fracture. No primary bone lesion or focal pathologic process. Soft tissues and spinal canal: No prevertebral fluid or swelling. No visible canal hematoma. Disc levels: Moderate to severe multilevel disc space height loss and osteophytosis throughout the cervical spine. Upper chest:  Negative. Other: None. IMPRESSION: 1. No acute intracranial pathology. 2. Status post left frontal burr hole craniotomy. 3. Mucosal thickening and small air-fluid level of the right maxillary sinus. Correlate for acute sinusitis. 4. No fracture or static subluxation of the cervical spine. 5. 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 Electronically Signed   By: Jearld Lesch M.D.   On: 08/26/2022 14:50   DG Hip Unilat W or Wo Pelvis 2-3 Views Left  Result Date: 08/26/2022 CLINICAL DATA:  Pain after fall EXAM: DG HIP (WITH OR WITHOUT PELVIS) 3V LEFT COMPARISON:  None Available. FINDINGS: There is contrast in the bladder and distal ureters. Hyperostosis. Slight joint space loss of the right hip. No fracture or dislocation. Scattered vascular calcifications. IMPRESSION: Mild degenerative changes. Electronically Signed   By: Karen Kays M.D.   On: 08/26/2022 14:23   CT CORONARY MORPH W/CTA COR W/SCORE W/CA W/CM &/OR WO/CM  Result Date: 08/26/2022 CLINICAL DATA:  Dyspnea on exertion EXAM: Cardiac/Coronary  CTA TECHNIQUE: The patient was scanned on a Siemens Somatom go.Top scanner. : A retrospective scan was triggered in the ascending thoracic aorta. Axial non-contrast 3 mm slices were carried out through the heart. The data set was analyzed on a dedicated work station and scored using the Agatson method. Gantry rotation speed was 330 msecs and collimation was .6 mm. 100mg  of metoprolol and 0.8 mg of sl NTG was given. The 3D data set was reconstructed in 5% intervals of the 60-95 % of the R-R cycle. Diastolic phases were analyzed on a dedicated work station using MPR, MIP and VRT modes. The patient received 100 cc of contrast. FINDINGS: Aorta: Normal size. Aortic atherosclerosis calcifications. No dissection. Aortic Valve:  Trileaflet.  No calcifications. Coronary Arteries:  Normal coronary origin.  Right dominance. RCA is a dominant artery. There is no plaque. Left main gives rise to LAD and  LCX arteries. LM has no disease. LAD has calcified plaque proximally causing minimal stenosis (<25%). LCX is a non-dominant artery.  There is no plaque. Other findings: Normal pulmonary vein drainage into the left atrium. Normal left atrial appendage without a thrombus. Normal size of the pulmonary artery. IMPRESSION: 1. Coronary calcium score of 14.3. This was 12th percentile for age and sex matched control. 2. Normal coronary origin with right dominance. 3. Minimal proximal LAD stenosis (<25%). 4. CAD-RADS 1. Minimal non-obstructive CAD (0-24%). Consider preventive therapy and risk factor modification. Electronically Signed   By: Debbe Odea M.D.   On: 08/26/2022 13:58   PROCEDURES: Critical Care performed: No .1-3 Lead EKG Interpretation  Performed by: Merwyn Katos, MD Authorized by: Merwyn Katos, MD     Interpretation: normal     ECG rate:  71   ECG rate assessment: normal     Rhythm: sinus rhythm     Ectopy: none     Conduction: normal    MEDICATIONS ORDERED IN ED: Medications  aspirin EC tablet 81 mg (has no administration  in time range)  zanubrutinib (BRUKINSA) capsule 160 mg (160 mg Oral Not Given 08/26/22 2216)  hydrALAZINE (APRESOLINE) tablet 50 mg (has no administration in time range)  irbesartan (AVAPRO) tablet 150 mg (has no administration in time range)  dapagliflozin propanediol (FARXIGA) tablet 10 mg (has no administration in time range)  levothyroxine (SYNTHROID) tablet 200 mcg (200 mcg Oral Given 08/27/22 0440)  pregabalin (LYRICA) capsule 100 mg (100 mg Oral Given 08/26/22 2220)  sodium chloride flush (NS) 0.9 % injection 3 mL (3 mLs Intravenous Given 08/26/22 2233)  insulin aspart (novoLOG) injection 0-15 Units (has no administration in time range)  insulin aspart (novoLOG) injection 0-5 Units ( Subcutaneous Patient Refused/Not Given 08/26/22 2216)  enoxaparin (LOVENOX) injection 55 mg (55 mg Subcutaneous Given 08/26/22 2220)  acetaminophen (TYLENOL) tablet 650 mg (has  no administration in time range)    Or  acetaminophen (TYLENOL) suppository 650 mg (has no administration in time range)  morphine (PF) 2 MG/ML injection 2 mg (has no administration in time range)  ondansetron (ZOFRAN) tablet 4 mg (has no administration in time range)    Or  ondansetron (ZOFRAN) injection 4 mg (has no administration in time range)  lactated ringers bolus 500 mL (0 mLs Intravenous Hold 08/26/22 2232)  oxyCODONE-acetaminophen (PERCOCET/ROXICET) 5-325 MG per tablet 1 tablet (1 tablet Oral Given 08/27/22 0622)    And  oxyCODONE (Oxy IR/ROXICODONE) immediate release tablet 5 mg (5 mg Oral Given 08/27/22 0622)  lactated ringers infusion ( Intravenous Rate/Dose Change 08/26/22 2345)  ceFEPIme (MAXIPIME) 2 g in sodium chloride 0.9 % 100 mL IVPB (has no administration in time range)  vancomycin (VANCOREADY) IVPB 1500 mg/300 mL (has no administration in time range)  iohexol (OMNIPAQUE) 300 MG/ML solution 100 mL (100 mLs Intravenous Contrast Given 08/26/22 1734)  acetaminophen (TYLENOL) tablet 1,000 mg (1,000 mg Oral Given 08/26/22 2108)  vancomycin (VANCOREADY) IVPB 2000 mg/400 mL (2,000 mg Intravenous New Bag/Given 08/27/22 0239)    Followed by  vancomycin (VANCOREADY) IVPB 500 mg/100 mL (500 mg Intravenous New Bag/Given 08/27/22 0443)   IMPRESSION / MDM / ASSESSMENT AND PLAN / ED COURSE  I reviewed the triage vital signs and the nursing notes.                             The patient is on the cardiac monitor to evaluate for evidence of arrhythmia and/or significant heart rate changes. Patient's presentation is most consistent with acute presentation with potential threat to life or bodily function. Patient is a 77 year old male with the above-stated past medical history presents after a mechanical fall complaining of left inguinal swelling and pain.  Differential diagnosis includes but is not limited to: Incarcerated inguinal hernia, traumatic hematoma, fracture left hip CT of the head/C-spine,  and x-ray of the chest/hip did not show any evidence of acute abnormalities Patient pending a CT of the abdomen and pelvis prior to final disposition.  This case was signed out to the oncoming provider.  All final care and disposition decisions will be made by the oncoming provider.   FINAL CLINICAL IMPRESSION(S) / ED DIAGNOSES   Final diagnoses:  Elevated troponin  Fall, initial encounter  Hematoma of groin, initial encounter   Rx / DC Orders   ED Discharge Orders     None      Note:  This document was prepared using Dragon voice recognition software and Paul include unintentional dictation errors.   Merwyn Katos, MD  08/27/22 0718  

## 2022-08-26 NOTE — Assessment & Plan Note (Signed)
Chronic continuous use of opiates Continue oxycodone, Lyrica

## 2022-08-26 NOTE — Assessment & Plan Note (Deleted)
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  Blood cultures ordered Low suspicion for sepsis Temperature improved Continue to monitor closely for sepsis physiology Will hold off on antibiotics for now Follow-up procalcitonin

## 2022-08-26 NOTE — Plan of Care (Signed)
  Problem: Education: Goal: Ability to describe self-care measures that may prevent or decrease complications (Diabetes Survival Skills Education) will improve Outcome: Progressing Goal: Individualized Educational Video(s) Outcome: Progressing   Problem: Coping: Goal: Ability to adjust to condition or change in health will improve Outcome: Progressing   Problem: Fluid Volume: Goal: Ability to maintain a balanced intake and output will improve Outcome: Progressing   Problem: Health Behavior/Discharge Planning: Goal: Ability to identify and utilize available resources and services will improve Outcome: Progressing Goal: Ability to manage health-related needs will improve Outcome: Progressing   Problem: Metabolic: Goal: Ability to maintain appropriate glucose levels will improve Outcome: Progressing   Problem: Nutritional: Goal: Maintenance of adequate nutrition will improve Outcome: Progressing Goal: Progress toward achieving an optimal weight will improve Outcome: Progressing   Problem: Skin Integrity: Goal: Risk for impaired skin integrity will decrease Outcome: Progressing   Problem: Tissue Perfusion: Goal: Adequacy of tissue perfusion will improve Outcome: Progressing   Problem: Education: Goal: Knowledge of condition and prescribed therapy will improve Outcome: Progressing   Problem: Cardiac: Goal: Will achieve and/or maintain adequate cardiac output Outcome: Progressing   Problem: Physical Regulation: Goal: Complications related to the disease process, condition or treatment will be avoided or minimized Outcome: Progressing   Problem: Fluid Volume: Goal: Hemodynamic stability will improve Outcome: Progressing   Problem: Clinical Measurements: Goal: Diagnostic test results will improve Outcome: Progressing Goal: Signs and symptoms of infection will decrease Outcome: Progressing   Problem: Respiratory: Goal: Ability to maintain adequate ventilation will  improve Outcome: Progressing   Problem: Education: Goal: Knowledge of General Education information will improve Description: Including pain rating scale, medication(s)/side effects and non-pharmacologic comfort measures Outcome: Progressing   Problem: Health Behavior/Discharge Planning: Goal: Ability to manage health-related needs will improve Outcome: Progressing   Problem: Clinical Measurements: Goal: Ability to maintain clinical measurements within normal limits will improve Outcome: Progressing Goal: Will remain free from infection Outcome: Progressing Goal: Diagnostic test results will improve Outcome: Progressing Goal: Respiratory complications will improve Outcome: Progressing Goal: Cardiovascular complication will be avoided Outcome: Progressing   Problem: Activity: Goal: Risk for activity intolerance will decrease Outcome: Progressing   Problem: Nutrition: Goal: Adequate nutrition will be maintained Outcome: Progressing   Problem: Coping: Goal: Level of anxiety will decrease Outcome: Progressing   Problem: Elimination: Goal: Will not experience complications related to bowel motility Outcome: Progressing Goal: Will not experience complications related to urinary retention Outcome: Progressing   Problem: Pain Managment: Goal: General experience of comfort will improve Outcome: Progressing   Problem: Safety: Goal: Ability to remain free from injury will improve Outcome: Progressing   Problem: Skin Integrity: Goal: Risk for impaired skin integrity will decrease Outcome: Progressing

## 2022-08-26 NOTE — Sepsis Progress Note (Addendum)
Elink monitoring for the code sepsis protocol.  Secure chat sent to Dr. Para March for antibiotic orders.  Per MD, waiting to see CXR & UA results to decide what antibiotics to order.

## 2022-08-26 NOTE — Assessment & Plan Note (Signed)
Followed by oncology. On Brukinsa 80 mg twice daily

## 2022-08-26 NOTE — ED Provider Notes (Signed)
  Physical Exam  BP 130/84 (BP Location: Right Arm)   Pulse 70   Temp 98.9 F (37.2 C) (Oral)   Resp 16   Ht 6\' 1"  (1.854 m)   Wt 107.5 kg   SpO2 95%   BMI 31.27 kg/m   Physical Exam  Procedures  Procedures  ED Course / MDM    Medical Decision Making Amount and/or Complexity of Data Reviewed Labs: ordered. Radiology: ordered.  Risk OTC drugs. Prescription drug management.   77 year old male presenting to the ER after falling yesterday. See note from Dr. Vicente Males. CT pending.   ----------------------------------------- 5:23 PM on 08/26/2022 ----------------------------------------- IV established. CT contacted.   ----------------------------------------- 6:07 PM on 08/26/2022 ----------------------------------------- IV not working per staff. IV team consult has been requested.  ----------------------------------------- 8:00 PM on 08/26/2022 ----------------------------------------- EKG that had been ordered earlier completed. Changes noted from last, but similar findings in 2017. Troponin ordered. Patient denies chest pain or shortness of breath. CT results are pending.  ----------------------------------------- 8:47 PM on 08/26/2022 ----------------------------------------- Troponin elevated to 49. CT shows area of tenderness in the left groin area is likely a hematoma. Results discussed with the patient and family. Patient continues to deny chest pain yesterday or today. He now reports 2 separate falls yesterday due to "losing my balance."  Plan will be to admit for further monitoring of cardiac enzymes and frequent falls. Hospitalist consult ordered.  ----------------------------------------- 9:20 PM on 08/26/2022 ----------------------------------------- Accepted for observation.   Chinita Pester, FNP 08/26/22 2120    Merwyn Katos, MD 08/27/22 [phone]

## 2022-08-27 ENCOUNTER — Inpatient Hospital Stay: Payer: No Typology Code available for payment source | Admitting: Oncology

## 2022-08-27 ENCOUNTER — Inpatient Hospital Stay: Payer: No Typology Code available for payment source

## 2022-08-27 ENCOUNTER — Telehealth: Payer: Self-pay

## 2022-08-27 ENCOUNTER — Inpatient Hospital Stay: Payer: No Typology Code available for payment source | Attending: Oncology

## 2022-08-27 DIAGNOSIS — I10 Essential (primary) hypertension: Secondary | ICD-10-CM | POA: Diagnosis not present

## 2022-08-27 DIAGNOSIS — I428 Other cardiomyopathies: Secondary | ICD-10-CM

## 2022-08-27 DIAGNOSIS — L03314 Cellulitis of groin: Secondary | ICD-10-CM | POA: Diagnosis not present

## 2022-08-27 DIAGNOSIS — W19XXXA Unspecified fall, initial encounter: Secondary | ICD-10-CM

## 2022-08-27 DIAGNOSIS — R296 Repeated falls: Secondary | ICD-10-CM | POA: Diagnosis not present

## 2022-08-27 DIAGNOSIS — C9110 Chronic lymphocytic leukemia of B-cell type not having achieved remission: Secondary | ICD-10-CM | POA: Diagnosis not present

## 2022-08-27 DIAGNOSIS — M60052 Infective myositis, left thigh: Secondary | ICD-10-CM

## 2022-08-27 DIAGNOSIS — B9561 Methicillin susceptible Staphylococcus aureus infection as the cause of diseases classified elsewhere: Secondary | ICD-10-CM

## 2022-08-27 DIAGNOSIS — E119 Type 2 diabetes mellitus without complications: Secondary | ICD-10-CM | POA: Diagnosis not present

## 2022-08-27 DIAGNOSIS — L02416 Cutaneous abscess of left lower limb: Secondary | ICD-10-CM | POA: Diagnosis not present

## 2022-08-27 DIAGNOSIS — D509 Iron deficiency anemia, unspecified: Secondary | ICD-10-CM

## 2022-08-27 DIAGNOSIS — L02214 Cutaneous abscess of groin: Secondary | ICD-10-CM

## 2022-08-27 LAB — GLUCOSE, CAPILLARY
Glucose-Capillary: 132 mg/dL — ABNORMAL HIGH (ref 70–99)
Glucose-Capillary: 106 mg/dL — ABNORMAL HIGH (ref 70–99)

## 2022-08-27 MED ORDER — CHLORHEXIDINE GLUCONATE CLOTH 2 % EX PADS
6.0000 | MEDICATED_PAD | Freq: Every day | CUTANEOUS | Status: DC
  Administered 2022-08-28 – 2022-09-01 (×5): 6 via TOPICAL

## 2022-08-27 MED ORDER — ROSUVASTATIN CALCIUM 10 MG PO TABS
40.0000 mg | ORAL_TABLET | Freq: Every day | ORAL | Status: DC
  Administered 2022-08-27 – 2022-09-01 (×6): 40 mg via ORAL
  Filled 2022-08-27 (×6): qty 4

## 2022-08-27 MED ORDER — VANCOMYCIN HCL 500 MG/100ML IV SOLN
500.0000 mg | Freq: Once | INTRAVENOUS | Status: AC
  Administered 2022-08-27: 500 mg via INTRAVENOUS
  Filled 2022-08-27: qty 100

## 2022-08-27 MED ORDER — VANCOMYCIN HCL 1500 MG/300ML IV SOLN: 1500.0000 mg | INTRAVENOUS | Status: DC

## 2022-08-27 MED ORDER — VANCOMYCIN HCL IN DEXTROSE 1-5 GM/200ML-% IV SOLN: 1000.0000 mg | Freq: Once | INTRAVENOUS | Status: DC

## 2022-08-27 MED ORDER — VANCOMYCIN HCL 2000 MG/400ML IV SOLN
2000.0000 mg | Freq: Once | INTRAVENOUS | Status: AC
  Administered 2022-08-27: 2000 mg via INTRAVENOUS
  Filled 2022-08-27: qty 400

## 2022-08-27 MED ORDER — SODIUM CHLORIDE 0.9 % IV SOLN
2.0000 g | Freq: Three times a day (TID) | INTRAVENOUS | Status: DC
  Administered 2022-08-27 – 2022-08-28 (×4): 2 g via INTRAVENOUS
  Filled 2022-08-27 (×4): qty 12.5

## 2022-08-27 MED ORDER — VANCOMYCIN HCL 1500 MG/300ML IV SOLN
1500.0000 mg | INTRAVENOUS | Status: DC
  Administered 2022-08-28 – 2022-08-30 (×3): 1500 mg via INTRAVENOUS
  Filled 2022-08-27 (×3): qty 300

## 2022-08-27 MED ORDER — METOPROLOL SUCCINATE ER 25 MG PO TB24
25.0000 mg | ORAL_TABLET | Freq: Every day | ORAL | Status: DC
  Administered 2022-08-27 – 2022-09-01 (×6): 25 mg via ORAL
  Filled 2022-08-27 (×6): qty 1

## 2022-08-27 MED ORDER — SODIUM CHLORIDE 0.9 % IV SOLN
2.0000 g | Freq: Three times a day (TID) | INTRAVENOUS | Status: DC
  Administered 2022-08-27: 2 g via INTRAVENOUS
  Filled 2022-08-27 (×2): qty 12.5

## 2022-08-27 NOTE — Progress Notes (Signed)
Pharmacy Antibiotic Note  Paul Briggs. is a 77 y.o. male admitted on 08/26/2022 with cellulitis.  Pharmacy has been consulted for Vancomycin, Cefepime dosing.  Plan: Cefepime 2 gm IV Q8H ordered to start on 8/2 @ 0200.  Vancomycin 2500 mg IV X 1 ordered as loading dose for 8/2 @ ~ 0200. Vancomycin 1500 mg IV Q24H ordered to start on 8/3 @ 0200.   AUC = 501.4 Vanc trough = 10.8  Height: 6\' 1"  (185.4 cm) Weight: 107.5 kg (236 lb 15.9 oz) IBW/kg (Calculated) : 79.9  Temp (24hrs), Avg:99.6 F (37.6 C), Min:98.9 F (37.2 C), Max:100.9 F (38.3 C)  Recent Labs  Lab 08/21/22 0216 08/22/22 0412 08/23/22 0502 08/24/22 0419 08/26/22 1146 08/26/22 2218 08/26/22 2359  WBC 6.4 7.4 7.7 8.3 13.2*  --   --   CREATININE 1.13 0.99 1.10 1.12 1.15  --   --   LATICACIDVEN  --   --   --   --   --  1.2 1.8    Estimated Creatinine Clearance: 70.3 mL/min (by C-G formula based on SCr of 1.15 mg/dL).    No Known Allergies  Antimicrobials this admission:   >>    >>   Dose adjustments this admission:   Microbiology results:  BCx:   UCx:    Sputum:    MRSA PCR:   Thank you for allowing pharmacy to be a part of this patient's care.  , D 08/27/2022 1:41 AM

## 2022-08-27 NOTE — Progress Notes (Signed)
Progress Note   Patient: Paul Waymire [name] GQQ:[id number] DOB: 14-Oct-1945 DOA: 08/26/2022     0 DOS: the patient was seen and examined on 08/27/2022   Brief hospital course: 77 y.o. 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 He presents with left groin pain. He apparently had falls at home with no loss of consciousness. He complains of a painful knot felt to the left upper thigh and groin area. He had low grade fever during this course of stay with Tmax of 100.39F  Assessment and Plan: Cellulitis/Induration of left groin Associated fever related to contusion.temperature of 100.9, without tachycardia WBC 15,000, up from 8000 on discharge a couple days prior, lactic acid 1.2 Suspected SIRS. Procalcitonin was 0.15 I agree with Cefepime for cellulitis/Induration.General surgery consulted to evaluate and advise if I&D may be warranted.CT :Subcutaneous fat stranding within the left lower quadrant anterior abdominal wall extending into the anterior proximal left thigh, likely related to contusion given history of recent trauma.No fluid collection or hematoma  Elevated troponin:  Known chronic LBBB,CAD,Continuous cardiac monitoring  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 CAD on coronary CTA.Continue on Toprol to replace amlodipine Continue to trend troponin.EKG was concerning to EDP Cardiology consult  Frequent fall/ presyncope: Consider revising opiates medications Patient had recent echo in July 2024 to evaluate her new LBBB and showed cardiomyopathy with EF 45 to 50% and G1 DD Continuous cardiac monitoring Check orthostatics in a.m PTOT to evaluate patients  gait prior to discharge  Essential hypertension: BP not at goal.Continue metoprolol and valsartan and hydralazine prn  Type II diabetes mellitus (HCC):Continue Farxiga: Fingerstick gluocse monitoring per protocol. Insulin sliding scale   Hypothyroidism: Continue levothyroxine  IDA (iron deficiency anemia): Hemoglobin at baseline at 8.1.Daily H/h monitoring. Recent history of GI blood loss. Transfuse if HB< 7  CLL (chronic lymphocytic leukemia) (HCC): Followed by oncology. 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 Revised dosing as appropriate.Continue oxycodone, Lyrica  OSA on CPAP: CPAP nightly  Subjective: Patient feels better He still has significant pain in his left groin. Its tender to touch and limited motion noted.Tmax of 100.8 this morning  Physical Exam: Vitals:   08/27/22 0700 08/27/22 0800 08/27/22 0838 08/27/22 1216  BP:   (!) 150/72 (!) 163/84  Pulse:   74 80  Resp: 19 15 20 18   Temp:   (!) 100.8 F (38.2 C) 99.7 F (37.6 C)  TempSrc:   Oral Oral  SpO2:   96% 97%  Weight:      Height:       Patient is alert and oriented.Looks well without any distress HEENT:Head atraumatic. Neck supple. No JVD CHEST: Clinically cleat Abdomen: LLQ pain w/o ecchymosis or heamtoima. Left groin induration++ CVS: S1,S2 wnm or gallops CNS: No focal deficits Skin: Negative for rash  Data Reviewed: HB 8.1 WBC 13.2 Plt 158 Neutrophil 67 Glucose 163   .CT scan revealed Subcutaneous fat stranding within the left lower quadrant anterior abdominal wall extending into the anterior proximal left thigh, likely related to contusion given history of recent trauma. No fluid collection or hematoma. 2. No acute displaced fracture. 3. Retroperitoneal and pelvic lymphadenopathy as above, with waxing and waning appearance. Findings are consistent with known history  of CLL. 4. 1.9 cm hypodensity within the medial aspect of the  spleen, nonspecific. Leukemic involvement of the spleen cannot be excluded. 5. Distal colonic diverticulosis without diverticulitis. 6.  Aortic Atherosclerosis (ICD10-I70.0).   Family Communication: Wife at bedside  Disposition: Status is: Observation The patient will require care spanning > 2 midnights and should be moved to inpatient because: Fever and findings concerning for abscess+  Planned Discharge Destination: Home  Time spent: 35 minutes  Author: Lilia Pro, MD 08/27/2022 1:02 PM  For on call review [email or web address].

## 2022-08-27 NOTE — Progress Notes (Signed)
Pt blood sugar at 102, but glucometer not sinking yet. Will continue to monitor.  Update 0623: Pt blood sugar at 94, but glucometer not sin king yet. Will continue to monitor.

## 2022-08-27 NOTE — Consult Note (Signed)
Patient ID: Paul Ana [name], male   DOB: 06-28-1945, 77 y.o.   MRN: [id number]  HPI Paul Stamour [name] is a 77 y.o. male multiple medical problems including CLL, anemia cardiomyopathy with an ejection fraction of 45%, coronary artery disease, hypertension and diabetes.  Him in due to fall and also left inguinal pain.  Reports that he has experienced inguinal pain for the last 2 to 3 days that it pain is moderate sharp intermittent and worsening when he moves his leg. Labs with hemoglobin 8.1 fairly similar to 8.3 when discharged 2 days prior. WBC 13,000 up from 8.3 on 7/30. BMP unremarkable. Troponin 49.  He did have a CT scan of the chest abdomen pelvis that I personally reviewed showing evidence of cellulitis on the left groin all the way to the left thigh.  No definitive abscess but exam is limited due to only incorporating the very top portion of the thigh. He has been started on antibiotic and he seems to feel little bit better but persist to complain from lower extremity pain. HPI  Past Medical History:  Diagnosis Date   Acute prostatitis    BPH (benign prostatic hyperplasia)    Chronic kidney disease    CLL (chronic lymphocytic leukemia) (HCC)    Diabetes (HCC)    Dysplasia of prostate    Erectile dysfunction    HTN (hypertension)    Hypogonadism in male    Orchitis and epididymitis    Over weight    Palindromic rheumatism, hand    Prostatitis    Rectum pain    Testicle swelling    Testicle tenderness    Testicular mass     Past Surgical History:  Procedure Laterality Date   BACK SURGERY     BIOPSY  08/21/2022   Procedure: BIOPSY;  Surgeon: Jaynie Collins, DO;  Location: [facility] ENDOSCOPY;  Service: Gastroenterology;;   BIOPSY  08/23/2022   Procedure: BIOPSY;  Surgeon: Jaynie Collins, DO;  Location: [facility] ENDOSCOPY;  Service: Gastroenterology;;   COLONOSCOPY WITH PROPOFOL N/A 08/23/2022   Procedure: COLONOSCOPY WITH PROPOFOL;  Surgeon: Jaynie Collins, DO;  Location: [facility] ENDOSCOPY;  Service: Gastroenterology;  Laterality: N/A;   ESOPHAGOGASTRODUODENOSCOPY (EGD) WITH PROPOFOL N/A 08/21/2022   Procedure: ESOPHAGOGASTRODUODENOSCOPY (EGD) WITH PROPOFOL;  Surgeon: Jaynie Collins, DO;  Location: [facility] ENDOSCOPY;  Service: Gastroenterology;  Laterality: N/A;   GIVENS CAPSULE STUDY N/A 08/23/2022   Procedure: GIVENS CAPSULE STUDY;  Surgeon: Jaynie Collins, DO;  Location: [facility] ENDOSCOPY;  Service: Gastroenterology;  Laterality: N/A;   head surgery     from fall  drilled hole in brain to relieve pressure   POLYPECTOMY  08/23/2022   Procedure: POLYPECTOMY;  Surgeon: Jaynie Collins, DO;  Location: [facility] ENDOSCOPY;  Service: Gastroenterology;;   SUBMUCOSAL TATTOO INJECTION  08/23/2022   Procedure: SUBMUCOSAL TATTOO INJECTION;  Surgeon: Jaynie Collins, DO;  Location: [facility] ENDOSCOPY;  Service: Gastroenterology;;   TONSILLECTOMY      Family History  Problem Relation Age of Onset   Cancer Maternal Aunt    Prostate cancer Neg Hx    Kidney disease Neg Hx    Kidney cancer Neg Hx    Bladder Cancer Neg Hx     Social History Social History   Tobacco Use   Smoking status: Former   Smokeless tobacco: Never   Tobacco comments:    quit 30 years ago  Vaping Use   Vaping status: Never Used  Substance Use Topics   Alcohol use:  No    Alcohol/week: 0.0 standard drinks of alcohol   Drug use: No    No Known Allergies  Current Facility-Administered Medications  Medication Dose Route Frequency Provider Last Rate Last Admin   acetaminophen (TYLENOL) tablet 650 mg  650 mg Oral Q6H PRN Andris Baumann, MD   650 mg at 08/27/22 1233   Or   acetaminophen (TYLENOL) suppository 650 mg  650 mg Rectal Q6H PRN Andris Baumann, MD       aspirin EC tablet 81 mg  81 mg Oral Daily Lindajo Royal V, MD   81 mg at 08/27/22 0944   ceFEPIme (MAXIPIME) 2 g in sodium chloride 0.9 % 100 mL IVPB  2 g Intravenous Q8H Lindajo Royal V, MD 200 mL/hr  at 08/27/22 0950 2 g at 08/27/22 0950   [START ON 08/28/2022] Chlorhexidine Gluconate Cloth 2 % PADS 6 each  6 each Topical Q0600 ,  F, MD       dapagliflozin propanediol (FARXIGA) tablet 10 mg  10 mg Oral QAC breakfast Lindajo Royal V, MD   10 mg at 08/27/22 0945   enoxaparin (LOVENOX) injection 55 mg  0.5 mg/kg Subcutaneous Q24H Lindajo Royal V, MD   55 mg at 08/26/22 2220   hydrALAZINE (APRESOLINE) tablet 50 mg  50 mg Oral q12n4p Lindajo Royal V, MD   50 mg at 08/27/22 1233   insulin aspart (novoLOG) injection 0-15 Units  0-15 Units Subcutaneous TID WC Andris Baumann, MD   2 Units at 08/27/22 0945   insulin aspart (novoLOG) injection 0-5 Units  0-5 Units Subcutaneous QHS Andris Baumann, MD       irbesartan (AVAPRO) tablet 150 mg  150 mg Oral Daily Lindajo Royal V, MD   150 mg at 08/27/22 0944   lactated ringers bolus 500 mL  500 mL Intravenous Once Andris Baumann, MD   Held at 08/26/22 2232   levothyroxine (SYNTHROID) tablet 200 mcg  200 mcg Oral Q0600 Lindajo Royal V, MD   200 mcg at 08/27/22 0440   metoprolol succinate (TOPROL-XL) 24 hr tablet 25 mg  25 mg Oral Daily Antonieta Iba, MD   25 mg at 08/27/22 0944   morphine (PF) 2 MG/ML injection 2 mg  2 mg Intravenous Q3H PRN Andris Baumann, MD   2 mg at 08/27/22 0745   ondansetron (ZOFRAN) tablet 4 mg  4 mg Oral Q6H PRN Andris Baumann, MD       Or   ondansetron [facility]) injection 4 mg  4 mg Intravenous Q6H PRN Andris Baumann, MD       oxyCODONE-acetaminophen (PERCOCET/ROXICET) 5-325 MG per tablet 1 tablet  1 tablet Oral Q8H PRN Andris Baumann, MD   1 tablet at 08/27/22 6295   And   oxyCODONE (Oxy IR/ROXICODONE) immediate release tablet 5 mg  5 mg Oral Q8H PRN Andris Baumann, MD   5 mg at 08/27/22 0622   pregabalin (LYRICA) capsule 100 mg  100 mg Oral BID Lindajo Royal V, MD   100 mg at 08/27/22 0944   rosuvastatin (CRESTOR) tablet 40 mg  40 mg Oral Daily Hammock, Sheri, NP   40 mg at 08/27/22 1333   sodium chloride flush  (NS) 0.9 % injection 3 mL  3 mL Intravenous Q12H Lindajo Royal V, MD   3 mL at 08/27/22 0952   [START ON 08/28/2022] vancomycin (VANCOREADY) IVPB 1500 mg/300 mL  1,500 mg Intravenous Q24H Andris Baumann, MD  Review of Systems Full ROS  was asked and was negative except for the information on the HPI  Physical Exam Blood pressure (!) 163/84, pulse 80, temperature 99.7 F (37.6 C), temperature source Oral, resp. rate 18, height 6\' 1"  (1.854 m), weight 104.8 kg, SpO2 97%. CONSTITUTIONAL: NAD. EYES: Pupils are equal, round, Sclera are non-icteric. EARS, NOSE, MOUTH AND THROAT: The oropharynx is clear. The oral mucosa is pink and moist. Hearing is intact to voice. LYMPH NODES:  Lymph nodes in the neck are normal. RESPIRATORY:  Lungs are clear. There is normal respiratory effort, with equal breath sounds bilaterally, and without pathologic use of accessory muscles. CARDIOVASCULAR: Heart is regular without murmurs, gallops, or rubs. GI: The abdomen is  soft, nontender, and nondistended. There are no palpable masses. There is no hepatosplenomegaly. There are normal bowel sounds  GU: Rectal deferred.   MUSCULOSKELETAL: Left thigh with induration erythema and fluctuance area of 12x12 cms, tender to palpation, no necrotizing infection. Normal muscle strength and tone. No cyanosis or edema.   SKIN: Turgor is good and there are no pathologic skin lesions or ulcers. NEUROLOGIC: Motor and sensation is grossly normal. Cranial nerves are grossly intact. PSYCH:  Oriented to person, place and time. Affect is normal.  Data Reviewed  I have personally reviewed the patient's imaging, laboratory findings and medical records.    Assessment/Plan 77 year old diabetic male with left thigh cellulitis and developing abscess in need for formal incision and drainage.  Patient just had lunch.  Discussed with him in detail about the procedure in detail.  Risk, benefits and possible complications including but not  limited to: Bleeding, infection, potential need for further surgeries or debridements.  Chronic pain he understands and wished to proceed.  Will make sure that the patient is n.p.o. and perform the incision and drainage under general anesthetic tomorrow.  I agree with current antibiotic therapy.  Sterling Big, MD FACS General Surgeon 08/27/2022, 1:50 PM

## 2022-08-27 NOTE — Assessment & Plan Note (Addendum)
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  Blood cultures ordered Continue to monitor closely for sepsis physiology Antibiotics for cellulitis started Ultrasound left groin ordered Ice packs to groin Follow-up procalcitonin

## 2022-08-27 NOTE — Consult Note (Signed)
Cardiology Consultation   Patient ID: Kaizen Ibsen [name] MRN: [id number]; DOB: 21-Jul-1945  Admit date: 08/26/2022 Date of Consult: 08/27/2022  PCP:  Wilford Corner, PA-C   [facility] Providers Cardiologist:  Debbe Odea, MD        Patient Profile:   Tamar Lipscomb [name] is a 77 y.o. male with a hx of HTN, diabetes, CLL s/p chemotherapy, CAD, HFmrEF, CKD stage III, IDA,chronic LBBB, who is being seen 08/27/2022 for the evaluation of presyncope at the request of Dr.Duncan.  History of Present Illness:   Mr. Mendizabal was initially seen 05/2022 for hypertension. He was started on Diovan 160 mg daily. EKG showed LBBB. Echocardiogram revealed an LVEF of 40 to 50%, no regional wall motion, moderate to severe range with speckled appearance, will consider for amyloidosis, severely dilated left atrium, mildly dilated right atrium, small pericardial effusion with no tamponade, and mild MR. He was last seen in clinic 08/09/2022.  He denies any chest pain but continues to endorses shortness of breath when he walked or lay down.  He also noted he had occasional leg edema.  Was unsure if at times he will continue to take his dialysate.  Scheduled for cardiac CTA and cardiac MRI.  At that time amlodipine was discontinued and he was started on Farxiga 10 mg daily and Toprol 25 mg daily.  Scheduled for blood work within 2 weeks at that time.  He was also started on aspirin 81 mg daily and his rosuvastatin was increased to 40 mg daily.  Patient presented to the [facility] emergency department 08/26/2022 s/p mechanical fall x 2.  Patient apparently fell backwards from a standing position and his head on the ground without loss of consciousness.  He stated later on when he had woke up in the morning he had an unknown knot to his left upper thigh and groin area.  Patient has no documented history of inguinal hernia or any known history.  Patient states that the area is extremely tender and  swollen he rates pain 10 out of 10 describes pain as a burning/aching pain along the area that is worse with palpation and walking.  Denies any chest discomfort, chest tightness, palpitations, shortness of breath, abdominal pain, nausea vomiting or diarrhea.  Initial vital signs: Blood pressure 135/88, pulse 66, respirations of 16, temperature 99.1  Pertinent labs: WBC 15.2, hemoglobin 8.1, hematocrit 26.2, blood glucose 163, calcium 8.1, INR 1.4, high-sensitivity troponin 49 and 58  Imaging: CT of the head without contrast next showed no evidence of acute abnormalities including the cranial hemorrhage, obvious masses, or significant edema; CT of the cervical spine showed no acute abnormalities including no acute fracture, malignancy, height loss, dislocation; chest x-ray with no evidence of acute abnormalities including no pneumonia, pneumothorax, or widened mediastinum  Medications administered in the emergency department: Vancomycin 2.5 gm IVPB, 1 L of LR, 1 gm Tylenol  Cardiology consulted for concern of presyncope with mechanical fall.   Past Medical History:  Diagnosis Date   Acute prostatitis    BPH (benign prostatic hyperplasia)    Chronic kidney disease    CLL (chronic lymphocytic leukemia) (HCC)    Diabetes (HCC)    Dysplasia of prostate    Erectile dysfunction    HTN (hypertension)    Hypogonadism in male    Orchitis and epididymitis    Over weight    Palindromic rheumatism, hand    Prostatitis    Rectum pain    Testicle swelling  Testicle tenderness    Testicular mass     Past Surgical History:  Procedure Laterality Date   BACK SURGERY     BIOPSY  08/21/2022   Procedure: BIOPSY;  Surgeon: Jaynie Collins, DO;  Location: [facility] ENDOSCOPY;  Service: Gastroenterology;;   BIOPSY  08/23/2022   Procedure: BIOPSY;  Surgeon: Jaynie Collins, DO;  Location: [facility] ENDOSCOPY;  Service: Gastroenterology;;   COLONOSCOPY WITH PROPOFOL N/A 08/23/2022   Procedure:  COLONOSCOPY WITH PROPOFOL;  Surgeon: Jaynie Collins, DO;  Location: [facility] ENDOSCOPY;  Service: Gastroenterology;  Laterality: N/A;   ESOPHAGOGASTRODUODENOSCOPY (EGD) WITH PROPOFOL N/A 08/21/2022   Procedure: ESOPHAGOGASTRODUODENOSCOPY (EGD) WITH PROPOFOL;  Surgeon: Jaynie Collins, DO;  Location: [facility] ENDOSCOPY;  Service: Gastroenterology;  Laterality: N/A;   GIVENS CAPSULE STUDY N/A 08/23/2022   Procedure: GIVENS CAPSULE STUDY;  Surgeon: Jaynie Collins, DO;  Location: [facility] ENDOSCOPY;  Service: Gastroenterology;  Laterality: N/A;   head surgery     from fall  drilled hole in brain to relieve pressure   POLYPECTOMY  08/23/2022   Procedure: POLYPECTOMY;  Surgeon: Jaynie Collins, DO;  Location: [facility] ENDOSCOPY;  Service: Gastroenterology;;   SUBMUCOSAL TATTOO INJECTION  08/23/2022   Procedure: SUBMUCOSAL TATTOO INJECTION;  Surgeon: Jaynie Collins, DO;  Location: [facility] ENDOSCOPY;  Service: Gastroenterology;;   TONSILLECTOMY       Home Medications:  Prior to Admission medications   Medication Sig Start Date End Date Taking? Authorizing Provider  aspirin EC 81 MG tablet Take 1 tablet (81 mg total) by mouth daily. Swallow whole. 08/09/22   Furth, Cadence H, PA-C  dapagliflozin propanediol (FARXIGA) 10 MG TABS tablet Take 1 tablet (10 mg total) by mouth daily before breakfast. 08/09/22   Furth, Cadence H, PA-C  hydrALAZINE (APRESOLINE) 50 MG tablet Take 1 tablet (50 mg total) by mouth in the morning and at bedtime. 07/28/22 10/26/22  Debbe Odea, MD  levothyroxine (SYNTHROID) 200 MCG tablet Take 200 mcg by mouth daily. 01/22/22   [provider]  metFORMIN (GLUCOPHAGE) 500 MG tablet Take 500 mg by mouth 2 (two) times daily with a meal.    [provider]  metoprolol succinate (TOPROL XL) 25 MG 24 hr tablet Take 1 tablet (25 mg total) by mouth daily. 08/09/22   Furth, Cadence H, PA-C  oxyCODONE-acetaminophen (PERCOCET) 10-325 MG tablet Take 1 tablet by  mouth every 8 (eight) hours as needed. 08/23/22 09/22/22  Edward Jolly, MD  oxyCODONE-acetaminophen (PERCOCET) 10-325 MG tablet Take 1 tablet by mouth every 8 (eight) hours as needed. 09/22/22 10/22/22  Edward Jolly, MD  oxyCODONE-acetaminophen (PERCOCET) 10-325 MG tablet Take 1 tablet by mouth every 8 (eight) hours as needed. 10/22/22 11/21/22  Edward Jolly, MD  pregabalin (LYRICA) 100 MG capsule Take 1 capsule (100 mg total) by mouth 2 (two) times daily. 05/25/22 11/21/22  Edward Jolly, MD  rosuvastatin (CRESTOR) 40 MG tablet Take 1 tablet (40 mg total) by mouth daily. 08/09/22 11/07/22  Furth, Cadence H, PA-C  trospium (SANCTURA) 20 MG tablet Take 1 tablet (20 mg total) by mouth 2 (two) times daily. 03/30/22   Michiel Cowboy A, PA-C  valsartan (DIOVAN) 160 MG tablet Take 160 mg by mouth daily. 08/18/22   [provider]  zanubrutinib (BRUKINSA) 80 MG capsule Take 2 capsules (160 mg total) by mouth 2 (two) times daily. 08/06/22   [name], MD    Inpatient Medications: Scheduled Meds:  aspirin EC  81 mg Oral Daily   dapagliflozin propanediol  10 mg  Oral QAC breakfast   enoxaparin (LOVENOX) injection  0.5 mg/kg Subcutaneous Q24H   hydrALAZINE  50 mg Oral q12n4p   insulin aspart  0-15 Units Subcutaneous TID WC   insulin aspart  0-5 Units Subcutaneous QHS   irbesartan  150 mg Oral Daily   levothyroxine  200 mcg Oral Q0600   metoprolol succinate  25 mg Oral Daily   pregabalin  100 mg Oral BID   sodium chloride flush  3 mL Intravenous Q12H   Continuous Infusions:  ceFEPime (MAXIPIME) IV 2 g (08/27/22 0950)   lactated ringers Stopped (08/26/22 2232)   [START ON 08/28/2022] vancomycin     PRN Meds: acetaminophen **OR** acetaminophen, morphine injection, ondansetron **OR** ondansetron (ZOFRAN) IV, oxyCODONE-acetaminophen **AND** oxyCODONE  Allergies:   No Known Allergies  Social History:   Social History   Socioeconomic History   Marital status: Married    Spouse name: Not on file    Number of children: Not on file   Years of education: Not on file   Highest education level: 5th grade  Occupational History   Not on file  Tobacco Use   Smoking status: Former   Smokeless tobacco: Never   Tobacco comments:    quit 30 years ago  Vaping Use   Vaping status: Never Used  Substance and Sexual Activity   Alcohol use: No    Alcohol/week: 0.0 standard drinks of alcohol   Drug use: No   Sexual activity: Not on file  Other Topics Concern   Not on file  Social History Narrative   Not on file   Social Determinants of Health   Financial Resource Strain: Medium Risk (07/26/2022)   Overall Financial Resource Strain (CARDIA)    Difficulty of Paying Living Expenses: Somewhat hard  Food Insecurity: No Food Insecurity (08/26/2022)   Hunger Vital Sign    Worried About Running Out of Food in the Last Year: Never true    Ran Out of Food in the Last Year: Never true  Recent Concern: Food Insecurity - Food Insecurity Present (07/26/2022)   Hunger Vital Sign    Worried About Radiation protection practitioner of Food in the Last Year: Sometimes true    Ran Out of Food in the Last Year: Never true  Transportation Needs: No Transportation Needs (08/26/2022)   PRAPARE - Administrator, Civil Service (Medical): No    Lack of Transportation (Non-Medical): No  Physical Activity: Unknown (07/26/2022)   Exercise Vital Sign    Days of Exercise per Week: Patient declined    Minutes of Exercise per Session: 0 min  Stress: Stress Concern Present (07/26/2022)   [employer] of Occupational Health - Occupational Stress Questionnaire    Feeling of Stress : To some extent  Social Connections: Moderately Integrated (07/26/2022)   Social Connection and Isolation Panel [NHANES]    Frequency of Communication with Friends and Family: More than three times a week    Frequency of Social Gatherings with Friends and Family: Patient declined    Attends Religious Services: More than 4 times per year    Active Member  of [employer] or Organizations: No    Attends Banker Meetings: Never    Marital Status: Married  Catering manager Violence: Not At Risk (08/26/2022)   Humiliation, Afraid, Rape, and Kick questionnaire    Fear of Current or Ex-Partner: No    Emotionally Abused: No    Physically Abused: No    Sexually Abused: No    Family History:  Family History  Problem Relation Age of Onset   Cancer Maternal Aunt    Prostate cancer Neg Hx    Kidney disease Neg Hx    Kidney cancer Neg Hx    Bladder Cancer Neg Hx      ROS:  Please see the history of present illness.  Review of Systems  Constitutional:  Positive for malaise/fatigue.  Musculoskeletal:  Positive for falls.       Groin pain with swelling  Neurological:  Positive for weakness.    All other ROS reviewed and negative.     Physical Exam/Data:   Vitals:   08/27/22 0600 08/27/22 0700 08/27/22 0800 08/27/22 0838  BP:    (!) 150/72  Pulse:    74  Resp: 20 19 15 20   Temp:    (!) 100.8 F (38.2 C)  TempSrc:    Oral  SpO2:    96%  Weight:      Height:        Intake/Output Summary (Last 24 hours) at 08/27/2022 0954 Last data filed at 08/27/2022 0950 Gross per 24 hour  Intake --  Output 600 ml  Net -600 ml      08/27/2022    4:44 AM 08/26/2022   12:30 PM 08/20/2022    1:48 PM  Last 3 Weights  Weight (lbs) 231 lb 0.7 oz 236 lb 15.9 oz 236 lb 15.9 oz  Weight (kg) 104.8 kg 107.5 kg 107.5 kg     Body mass index is 30.48 kg/m.  General:  Well nourished, well developed, in no acute distress HEENT: normal Neck: no JVD Vascular: No carotid bruits; Distal pulses 2+ bilaterally Cardiac:  normal S1, S2; RRR; no murmur  Lungs:  clear to auscultation bilaterally, no wheezing, rhonchi or rales  Abd: soft, nontender, no hepatomegaly  Ext: no edema, left groin tender to palpitation and swollen Musculoskeletal:  No deformities, BUE and BLE strength normal and equal Skin: warm and dry  Neuro:  CNs 2-12 intact, no focal  abnormalities noted Psych:  Normal affect   EKG:  The EKG was personally reviewed and demonstrates: Sinus rhythm, first degree AVB,chronic LBBB LVH Telemetry:  Telemetry was personally reviewed and demonstrates:  sinus rates in the 70's  Relevant CV Studies: Coronary CTA 08/26/2022 IMPRESSION: 1. Coronary calcium score of 14.3. This was 12th percentile for age and sex matched control.   2. Normal coronary origin with right dominance.   3. Minimal proximal LAD stenosis (<25%).   4. CAD-RADS 1. Minimal non-obstructive CAD (0-24%). Consider preventive therapy and risk factor modification.  TTE 07/2022 1. Left ventricular ejection fraction, by estimation, is 45 to 50%. Left  ventricular ejection fraction by 3D volume is 47 %. The left ventricle has  mildly decreased function. The left ventricle has no regional wall motion  abnormalities. There is  moderate to severe concentric left ventricular hypertrophy, speckled  appearance. Left ventricular diastolic parameters are consistent with  Grade I diastolic dysfunction (impaired relaxation). The average left  ventricular global longitudinal strain is  -18.8 %. Consider w/u for [facility] if clinically indicated.   2. Right ventricular systolic function is normal. The right ventricular  size is normal. Tricuspid regurgitation signal is inadequate for assessing  PA pressure.   3. Left atrial size was severely dilated.   4. Right atrial size was mildly dilated.   5. A small pericardial effusion is present. There is no evidence of  cardiac tamponade.   6. The mitral valve is normal in structure.  Mild mitral valve  regurgitation. No evidence of mitral stenosis.   7. The aortic valve is tricuspid. Aortic valve regurgitation is not  visualized. Aortic valve sclerosis/calcification is present, without any  evidence of aortic stenosis.   8. The inferior vena cava is normal in size with greater than 50%  respiratory variability, suggesting right  atrial pressure of 3 mmHg.   Laboratory Data:  High Sensitivity Troponin:   Recent Labs  Lab 08/26/22 2005 08/26/22 2218  TROPONINIHS 49* 58*     Chemistry Recent Labs  Lab 08/23/22 0502 08/24/22 0419 08/26/22 1146  NA 142 141 137  K 4.1 3.7 3.6  CL 111 112* 104  CO2 24 23 24   GLUCOSE 103* 148* 163*  BUN 8 14 12   CREATININE 1.10 1.12 1.15  CALCIUM 8.2* 8.1* 8.1*  GFRNONAA >60 >60 >60  ANIONGAP 7 6 9     Recent Labs  Lab 08/20/22 1350  PROT 6.1*  ALBUMIN 3.4*  AST 61*  ALT 23  ALKPHOS 44  BILITOT 1.0   Lipids No results for input(s): "CHOL", "TRIG", "HDL", "LABVLDL", "LDLCALC", "CHOLHDL" in the last 168 hours.  Hematology Recent Labs  Lab 08/23/22 0502 08/24/22 0419 08/26/22 1146  WBC 7.7 8.3 13.2*  RBC 3.03* 2.89* 2.86*  HGB 8.7* 8.3* 8.1*  HCT 27.3* 25.9* 26.2*  MCV 90.1 89.6 91.6  MCH 28.7 28.7 28.3  MCHC 31.9 32.0 30.9  RDW 17.4* 17.7* 17.9*  PLT 151 157 158   Thyroid No results for input(s): "TSH", "FREET4" in the last 168 hours.  BNPNo results for input(s): "BNP", "PROBNP" in the last 168 hours.  DDimer No results for input(s): "DDIMER" in the last 168 hours.   Radiology/Studies:  DG Chest Port 1 View  Result Date: 08/26/2022 CLINICAL DATA:  Frequent falls EXAM: PORTABLE CHEST 1 VIEW COMPARISON:  Chest x-ray 10/10/2014 FINDINGS: The heart is enlarged, unchanged. The lungs are clear. There is no pleural effusion or pneumothorax. No acute fractures are seen. IMPRESSION: 1. No active disease. 2. Cardiomegaly. Electronically Signed   By: Darliss Cheney M.D.   On: 08/26/2022 22:45   CT ABDOMEN PELVIS W CONTRAST  Result Date: 08/26/2022 CLINICAL DATA:  Loss of balance, fell, abdominal discomfort and swelling, history of CLL EXAM: CT ABDOMEN AND PELVIS WITH CONTRAST TECHNIQUE: Multidetector CT imaging of the abdomen and pelvis was performed using the standard protocol following bolus administration of intravenous contrast. RADIATION DOSE REDUCTION: This  exam was performed according to the departmental dose-optimization program which includes automated exposure control, adjustment of the mA and/or kV according to patient size and/or use of iterative reconstruction technique. CONTRAST:  OMNIPAQUE IOHEXOL 300 MG/ML  SOLN COMPARISON:  01/04/2022 FINDINGS: Lower chest: Cardiomegaly without pericardial effusion. No acute pleural or parenchymal lung disease. Hepatobiliary: No hepatic injury or perihepatic hematoma. Gallbladder is unremarkable. Pancreas: Unremarkable. No pancreatic ductal dilatation or surrounding inflammatory changes. Spleen: 1.9 cm hypodensity within the medial aspect of the spleen image 17/2 is nonspecific. The remainder of the spleen is unremarkable. No evidence of splenomegaly. Adrenals/Urinary Tract: Excreted contrast within the kidneys from CT coronary angiogram performed earlier today. Multiple bilateral renal cortical cysts do not require specific imaging follow-up. No obstructive uropathy within either kidney. No filling defects within the bladder. 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 Stomach/Bowel: No bowel obstruction or ileus. Normal appendix right lower quadrant. Scattered distal colonic diverticulosis without diverticulitis. No bowel wall thickening or inflammatory change. Vascular/Lymphatic: Stable aortic atherosclerosis. Continued retroperitoneal and  pelvic adenopathy. Index lymph nodes are as follows: Left external iliac, image 76/2, 2.9 cm in short axis. Previously 2.6 cm. Right external iliac, image 73/2, 1.0 cm in short axis. Previously 1.7 cm. Right external iliac, image 67/2, 1.5 cm in short axis. Previously 2.1 cm. No new adenopathy. Reproductive: Prostate is unremarkable. Other: No free fluid or free intraperitoneal gas. No abdominal wall hernia. Musculoskeletal: There is subcutaneous fat stranding within the anterior aspect of the proximal left thigh, extending into the left lower quadrant  anterior abdominal wall, which may reflect contusion after trauma. There is no fluid collection or hematoma. There are no acute displaced fractures. Reconstructed images demonstrate no additional findings. IMPRESSION: 1. Subcutaneous fat stranding within the left lower quadrant anterior abdominal wall extending into the anterior proximal left thigh, likely related to contusion given history of recent trauma. No fluid collection or hematoma. 2. No acute displaced fracture. 3. Retroperitoneal and pelvic lymphadenopathy as above, with waxing and waning appearance. 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 4. 1.9 cm hypodensity within the medial aspect of the spleen, nonspecific. Leukemic involvement of the spleen cannot be excluded. 5. Distal colonic diverticulosis without diverticulitis. 6.  Aortic Atherosclerosis (ICD10-I70.0). Electronically Signed   By: Sharlet Salina M.D.   On: 08/26/2022 19:54   CT Head Wo Contrast  Result Date: 08/26/2022 CLINICAL DATA:  Fall EXAM: CT HEAD WITHOUT CONTRAST CT CERVICAL SPINE WITHOUT CONTRAST TECHNIQUE: Multidetector CT imaging of the head and cervical spine was performed following the standard protocol without intravenous contrast. Multiplanar CT image reconstructions of the cervical spine were also generated. RADIATION DOSE REDUCTION: This exam was performed according to the departmental dose-optimization program which includes automated exposure control, adjustment of the mA and/or kV according to patient size and/or use of iterative reconstruction technique. COMPARISON:  None Available. FINDINGS: CT HEAD FINDINGS Brain: No evidence of acute infarction, hemorrhage, hydrocephalus, extra-axial collection or mass lesion/mass effect. Vascular: No hyperdense vessel or unexpected calcification. Skull: Status post left frontal craniotomy negative for fracture or focal lesion. Sinuses/Orbits: Mucosal thickening and small air-fluid level of the right maxillary sinus (series  2, image 4) Other: None. CT CERVICAL SPINE FINDINGS Alignment: Degenerative straightening and reversal of the normal cervical lordosis Skull base and vertebrae: No acute fracture. No primary bone lesion or focal pathologic process. Soft tissues and spinal canal: No prevertebral fluid or swelling. No visible canal hematoma. Disc levels: Moderate to severe multilevel disc space height loss and osteophytosis throughout the cervical spine. Upper chest: Negative. Other: None. IMPRESSION: 1. No acute intracranial pathology. 2. Status post left frontal burr hole craniotomy. 3. Mucosal thickening and small air-fluid level of the right maxillary sinus. Correlate for acute sinusitis. 4. No fracture or static subluxation of the cervical spine. 5. 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 Electronically Signed   By: Jearld Lesch M.D.   On: 08/26/2022 14:50   CT Cervical Spine Wo Contrast  Result Date: 08/26/2022 CLINICAL DATA:  Fall EXAM: CT HEAD WITHOUT CONTRAST CT CERVICAL SPINE WITHOUT CONTRAST TECHNIQUE: Multidetector CT imaging of the head and cervical spine was performed following the standard protocol without intravenous contrast. Multiplanar CT image reconstructions of the cervical spine were also generated. RADIATION DOSE REDUCTION: This exam was performed according to the departmental dose-optimization program which includes automated exposure control, adjustment of the mA and/or kV according to patient size and/or use of iterative reconstruction technique. COMPARISON:  None Available. FINDINGS: CT HEAD FINDINGS Brain: No evidence of acute infarction, hemorrhage, hydrocephalus,  extra-axial collection or mass lesion/mass effect. Vascular: No hyperdense vessel or unexpected calcification. Skull: Status post left frontal craniotomy negative for fracture or focal lesion. Sinuses/Orbits: Mucosal thickening and small air-fluid level of the right maxillary sinus (series 2, image 4) Other: None. CT  CERVICAL SPINE FINDINGS Alignment: Degenerative straightening and reversal of the normal cervical lordosis Skull base and vertebrae: No acute fracture. No primary bone lesion or focal pathologic process. Soft tissues and spinal canal: No prevertebral fluid or swelling. No visible canal hematoma. Disc levels: Moderate to severe multilevel disc space height loss and osteophytosis throughout the cervical spine. Upper chest: Negative. Other: None. IMPRESSION: 1. No acute intracranial pathology. 2. Status post left frontal burr hole craniotomy. 3. Mucosal thickening and small air-fluid level of the right maxillary sinus. Correlate for acute sinusitis. 4. No fracture or static subluxation of the cervical spine. 5. 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 Electronically Signed   By: Jearld Lesch M.D.   On: 08/26/2022 14:50   DG Hip Unilat W or Wo Pelvis 2-3 Views Left  Result Date: 08/26/2022 CLINICAL DATA:  Pain after fall EXAM: DG HIP (WITH OR WITHOUT PELVIS) 3V LEFT COMPARISON:  None Available. FINDINGS: There is contrast in the bladder and distal ureters. Hyperostosis. Slight joint space loss of the right hip. No fracture or dislocation. Scattered vascular calcifications. IMPRESSION: Mild degenerative changes. Electronically Signed   By: Karen Kays M.D.   On: 08/26/2022 14:23   CT CORONARY MORPH W/CTA COR W/SCORE W/CA W/CM &/OR WO/CM  Result Date: 08/26/2022 CLINICAL DATA:  Dyspnea on exertion EXAM: Cardiac/Coronary  CTA TECHNIQUE: The patient was scanned on a Siemens Somatom go.Top scanner. : A retrospective scan was triggered in the ascending thoracic aorta. Axial non-contrast 3 mm slices were carried out through the heart. The data set was analyzed on a dedicated work station and scored using the Agatson method. Gantry rotation speed was 330 msecs and collimation was .6 mm. 100mg  of metoprolol and 0.8 mg of sl NTG was given. The 3D data set was reconstructed in 5% intervals of the  60-95 % of the R-R cycle. Diastolic phases were analyzed on a dedicated work station using MPR, MIP and VRT modes. The patient received 100 cc of contrast. FINDINGS: Aorta: Normal size. Aortic atherosclerosis calcifications. No dissection. Aortic Valve:  Trileaflet.  No calcifications. Coronary Arteries:  Normal coronary origin.  Right dominance. RCA is a dominant artery. There is no plaque. Left main gives rise to LAD and LCX arteries. LM has no disease. LAD has calcified plaque proximally causing minimal stenosis (<25%). LCX is a non-dominant artery.  There is no plaque. Other findings: Normal pulmonary vein drainage into the left atrium. Normal left atrial appendage without a thrombus. Normal size of the pulmonary artery. IMPRESSION: 1. Coronary calcium score of 14.3. This was 12th percentile for age and sex matched control. 2. Normal coronary origin with right dominance. 3. Minimal proximal LAD stenosis (<25%). 4. CAD-RADS 1. Minimal non-obstructive CAD (0-24%). Consider preventive therapy and risk factor modification. Electronically Signed   By: Debbe Odea M.D.   On: 08/26/2022 13:58     Assessment and Plan:   Cellulitis of the left groin/SIRS -continued on IVF and antibiotics -patient with elevated temp 100.9, wbc 15,000 -ultrasound of the groin  -cool compress -continued management per IM  HFmrEF/CMP -recent echocardiogram revealed LVEF 45 -50%,no regional wall motion  abnormalities. 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 Left ventricular diastolic parameters are  consistent with Grade I diastolic dysfunction (impaired relaxation). The average left ventricular global longitudinal strain is -18.8 %. Consider w/u for [facility] if clinically indicated -coronary CAT completed  -cardiac MRI scheduled as outpatient -continued on dapagliflozin, irbesartan, Toprol XL -denies any shortness of breath -CXR reveals clear lungs -Patient appears  euvolemic on exam -Not on diuretic therapy for concerns of moderate to severe concentric left ventricular hypertrophy with a speckled appearance, patient will likely not tolerate possible dehydration -daily weights, I's&O's, low sodium diet  Hypertension -blood pressure 150/72 -continued on current medication regimen (most medications have not been administered as of yet) -vital signs per un it protocol  CAD by chest CTA/mildly elevated Hs Troponins -denies chest pain -no ischemic changes noted on EKG -Coronary CTA revealed minimal non-obstructive CAD (0-24%) -continue on ASA and statin therapy -continue on telemetry monitoring -EKG as needed for pain or changes -No further inpatient cardiac workup needed  S/p mechanical fall with concerns for presyncope -Recent admission with anemia, EGD and colonoscopy discharged Diarrhea, reports that he lost his balance, fell backwards, hit his head on the ground, also reports having a second fall later in the day after losing his balance -patient denies LOC -check orthostatics -ensure adequate hydration with IVF and increasing oral intake -medication review as patient is on several medications that can contribute to falls like chronic opiates of oxycodone and Lyrica -PT eval -continue with telemetry monitoring  Chronic LBBB -no change noted  Hyperlipidemia -restarted on statin therapy  CLL -s/p chemotherapy -on Brukinsa 80 mg twice daily -continues to be followed by Oncology as outpatient  IDA -hgb 8.1 -recommend keeping hgb greater than 8 -daily cbc -no signs of active bleeding  OSA -continue with nightly CPAP  Hypothyroidism -continue on levothyroxine  Type II diabetes -continue on insulin therapy -management per IM   Risk Assessment/Risk Scores:                For questions or updates, please contact [facility] Please consult [email or web address] for contact info under    Signed,  , NP   08/27/2022 9:54 AM

## 2022-08-27 NOTE — Progress Notes (Signed)
PHARMACY - PHYSICIAN COMMUNICATION CRITICAL VALUE ALERT - BLOOD CULTURE IDENTIFICATION (BCID)  Paul Law [name] is an 77 y.o. male who [facility] with a chief complaint of fall and sepsis  Assessment: Cellulitis in left groin, possible SIRS (include suspected source if known)  Name of physician (or Provider) Contacted: Dr Elisabeth Most  Current antibiotics: Cefepime and Vancomycin  Changes to prescribed antibiotics recommended:  Patient is on recommended antibiotics - No changes needed  Lab called with 1/4 bottles growing GPRs, no BCID  - possible contamination but currently covered with IV Vancomycin.   Rodriguez-Guzman PharmD, BCPS 08/27/2022 10:23 AM

## 2022-08-27 NOTE — Progress Notes (Signed)
Prior-To-Admission Oral Chemotherapy for Treatment of Oncologic Disease   Order noted from Dr. Para March to continue prior-to-admission oral chemotherapy regimen of Brukinsa.  Procedure Per Pharmacy & Therapeutics Committee Policy: Orders for continuation of home oral chemotherapy for treatment of an oncologic disease will be held unless approved by an oncologist during current admission.    For patients receiving oncology care at [facility], inpatient pharmacist contacts patient's oncologist during regular office hours to review. If earlier review is medically necessary, attending physician consults [facility] on-call oncologist   For patients receiving oncology care outside of [facility], attending physician consults patient's oncologist to review. If this oncologist or their coverage cannot be reached, attending physician consults [facility] on-call oncologist   Oral chemotherapy continuation order is on hold pending oncologist review, [facility] oncologist Dr. Cathie Hoops will be notified by inpatient pharmacy during office hours   Drusilla Kanner, PharmD, MBA

## 2022-08-27 NOTE — Telephone Encounter (Signed)
Pt currently hospitalized, MD appt has been added to next week's appt.   Appt with Dr. Tobi Bastos has been cancelled per Dr. Cathie Hoops. She says pt was already scoped during hospitalization.   Called and left VM with this information on pt's cell phone and wife's cell phone. Mychart message also sent.

## 2022-08-28 ENCOUNTER — Observation Stay: Payer: Self-pay | Admitting: Anesthesiology

## 2022-08-28 ENCOUNTER — Encounter
Admission: EM | Disposition: A | Discharge status: Home / Self Care | Payer: Self-pay | Source: Home / Self Care | Attending: Internal Medicine

## 2022-08-28 ENCOUNTER — Encounter: Payer: Self-pay | Admitting: Internal Medicine

## 2022-08-28 DIAGNOSIS — L02214 Cutaneous abscess of groin: Secondary | ICD-10-CM | POA: Diagnosis not present

## 2022-08-28 DIAGNOSIS — W010XXA Fall on same level from slipping, tripping and stumbling without subsequent striking against object, initial encounter: Secondary | ICD-10-CM | POA: Diagnosis present

## 2022-08-28 DIAGNOSIS — M60059 Infective myositis, unspecified thigh: Secondary | ICD-10-CM

## 2022-08-28 DIAGNOSIS — D509 Iron deficiency anemia, unspecified: Secondary | ICD-10-CM | POA: Diagnosis not present

## 2022-08-28 DIAGNOSIS — N400 Enlarged prostate without lower urinary tract symptoms: Secondary | ICD-10-CM | POA: Diagnosis not present

## 2022-08-28 DIAGNOSIS — R69 Illness, unspecified: Secondary | ICD-10-CM | POA: Diagnosis not present

## 2022-08-28 DIAGNOSIS — Z87891 Personal history of nicotine dependence: Secondary | ICD-10-CM | POA: Diagnosis not present

## 2022-08-28 DIAGNOSIS — L02416 Cutaneous abscess of left lower limb: Secondary | ICD-10-CM | POA: Diagnosis not present

## 2022-08-28 DIAGNOSIS — L03116 Cellulitis of left lower limb: Secondary | ICD-10-CM | POA: Diagnosis not present

## 2022-08-28 DIAGNOSIS — C9110 Chronic lymphocytic leukemia of B-cell type not having achieved remission: Secondary | ICD-10-CM | POA: Diagnosis not present

## 2022-08-28 DIAGNOSIS — N1831 Chronic kidney disease, stage 3a: Secondary | ICD-10-CM | POA: Diagnosis not present

## 2022-08-28 DIAGNOSIS — G8929 Other chronic pain: Secondary | ICD-10-CM | POA: Diagnosis not present

## 2022-08-28 DIAGNOSIS — I2510 Atherosclerotic heart disease of native coronary artery without angina pectoris: Secondary | ICD-10-CM | POA: Diagnosis not present

## 2022-08-28 DIAGNOSIS — Z7984 Long term (current) use of oral hypoglycemic drugs: Secondary | ICD-10-CM | POA: Diagnosis not present

## 2022-08-28 DIAGNOSIS — I130 Hypertensive heart and chronic kidney disease with heart failure and stage 1 through stage 4 chronic kidney disease, or unspecified chronic kidney disease: Secondary | ICD-10-CM | POA: Diagnosis not present

## 2022-08-28 DIAGNOSIS — D84821 Immunodeficiency due to drugs: Secondary | ICD-10-CM | POA: Diagnosis not present

## 2022-08-28 DIAGNOSIS — R296 Repeated falls: Secondary | ICD-10-CM | POA: Diagnosis not present

## 2022-08-28 DIAGNOSIS — G4733 Obstructive sleep apnea (adult) (pediatric): Secondary | ICD-10-CM | POA: Diagnosis not present

## 2022-08-28 DIAGNOSIS — E785 Hyperlipidemia, unspecified: Secondary | ICD-10-CM | POA: Diagnosis not present

## 2022-08-28 DIAGNOSIS — L03314 Cellulitis of groin: Secondary | ICD-10-CM | POA: Diagnosis not present

## 2022-08-28 DIAGNOSIS — E1122 Type 2 diabetes mellitus with diabetic chronic kidney disease: Secondary | ICD-10-CM | POA: Diagnosis not present

## 2022-08-28 DIAGNOSIS — E876 Hypokalemia: Secondary | ICD-10-CM | POA: Diagnosis not present

## 2022-08-28 DIAGNOSIS — I5022 Chronic systolic (congestive) heart failure: Secondary | ICD-10-CM | POA: Diagnosis not present

## 2022-08-28 DIAGNOSIS — E039 Hypothyroidism, unspecified: Secondary | ICD-10-CM | POA: Diagnosis not present

## 2022-08-28 DIAGNOSIS — I429 Cardiomyopathy, unspecified: Secondary | ICD-10-CM | POA: Diagnosis not present

## 2022-08-28 DIAGNOSIS — N183 Chronic kidney disease, stage 3 unspecified: Secondary | ICD-10-CM | POA: Diagnosis not present

## 2022-08-28 DIAGNOSIS — Z1152 Encounter for screening for COVID-19: Secondary | ICD-10-CM | POA: Diagnosis not present

## 2022-08-28 DIAGNOSIS — A419 Sepsis, unspecified organism: Secondary | ICD-10-CM | POA: Diagnosis not present

## 2022-08-28 DIAGNOSIS — A4102 Sepsis due to Methicillin resistant Staphylococcus aureus: Secondary | ICD-10-CM | POA: Diagnosis not present

## 2022-08-28 DIAGNOSIS — I129 Hypertensive chronic kidney disease with stage 1 through stage 4 chronic kidney disease, or unspecified chronic kidney disease: Secondary | ICD-10-CM | POA: Diagnosis not present

## 2022-08-28 DIAGNOSIS — I2489 Other forms of acute ischemic heart disease: Secondary | ICD-10-CM | POA: Diagnosis not present

## 2022-08-28 HISTORY — PX: IRRIGATION AND DEBRIDEMENT ABSCESS: SHX5252

## 2022-08-28 LAB — GLUCOSE, CAPILLARY
Glucose-Capillary: 89 mg/dL (ref 70–99)
Glucose-Capillary: 187 mg/dL — ABNORMAL HIGH (ref 70–99)
Glucose-Capillary: 172 mg/dL — ABNORMAL HIGH (ref 70–99)

## 2022-08-28 SURGERY — IRRIGATION AND DEBRIDEMENT ABSCESS
Anesthesia: General | Laterality: Left

## 2022-08-28 MED ORDER — PIPERACILLIN-TAZOBACTAM 3.375 G IVPB
3.3750 g | Freq: Three times a day (TID) | INTRAVENOUS | Status: DC
  Administered 2022-08-28 – 2022-08-30 (×6): 3.375 g via INTRAVENOUS
  Filled 2022-08-28 (×6): qty 50

## 2022-08-28 MED ORDER — HYDROMORPHONE HCL 1 MG/ML IJ SOLN
0.5000 mg | INTRAMUSCULAR | Status: DC | PRN
  Administered 2022-08-28 (×2): 0.5 mg via INTRAVENOUS

## 2022-08-28 MED ORDER — FENTANYL CITRATE (PF) 100 MCG/2ML IJ SOLN
25.0000 ug | INTRAMUSCULAR | Status: DC | PRN
  Administered 2022-08-28 (×2): 50 ug via INTRAVENOUS

## 2022-08-28 MED ORDER — ONDANSETRON HCL 4 MG/2ML IJ SOLN
INTRAMUSCULAR | Status: AC
  Filled 2022-08-28: qty 2

## 2022-08-28 MED ORDER — 0.9 % SODIUM CHLORIDE (POUR BTL) OPTIME
TOPICAL | Status: DC | PRN
  Administered 2022-08-28: 1000 mL

## 2022-08-28 MED ORDER — HYDROMORPHONE HCL 1 MG/ML IJ SOLN
INTRAMUSCULAR | Status: AC
  Filled 2022-08-28: qty 1

## 2022-08-28 MED ORDER — LIDOCAINE HCL (CARDIAC) PF 100 MG/5ML IV SOSY
PREFILLED_SYRINGE | INTRAVENOUS | Status: DC | PRN
  Administered 2022-08-28: 100 mg via INTRAVENOUS

## 2022-08-28 MED ORDER — FENTANYL CITRATE (PF) 100 MCG/2ML IJ SOLN
INTRAMUSCULAR | Status: AC
  Filled 2022-08-28: qty 2

## 2022-08-28 MED ORDER — FENTANYL CITRATE PF 50 MCG/ML IJ SOSY: 50.0000 ug | PREFILLED_SYRINGE | INTRAMUSCULAR | Status: DC | PRN

## 2022-08-28 MED ORDER — FENTANYL CITRATE (PF) 100 MCG/2ML IJ SOLN
INTRAMUSCULAR | Status: DC | PRN
  Administered 2022-08-28 (×4): 25 ug via INTRAVENOUS

## 2022-08-28 MED ORDER — PROPOFOL 10 MG/ML IV BOLUS
INTRAVENOUS | Status: DC | PRN
Start: 2022-08-28 — End: 2022-08-28
  Administered 2022-08-28: 50 mg via INTRAVENOUS
  Administered 2022-08-28: 150 mg via INTRAVENOUS

## 2022-08-28 MED ORDER — SODIUM CHLORIDE 0.9 % IV SOLN: INTRAVENOUS | Status: DC | PRN

## 2022-08-28 MED ORDER — ONDANSETRON HCL 4 MG/2ML IJ SOLN
INTRAMUSCULAR | Status: DC | PRN
Start: 2022-08-28 — End: 2022-08-28
  Administered 2022-08-28: 4 mg via INTRAVENOUS

## 2022-08-28 SURGICAL SUPPLY — 21 items
BLADE CLIPPER SURG (BLADE) ×1 IMPLANT
BLADE SURG 15 STRL LF DISP TIS (BLADE) ×1 IMPLANT
BLADE SURG 15 STRL SS (BLADE) ×1
BNDG CMPR 75X21 PLY HI ABS (MISCELLANEOUS) ×1
DRAPE LAPAROTOMY 77X122 PED (DRAPES) ×1 IMPLANT
ELECT REM PT RETURN 9FT ADLT (ELECTROSURGICAL) ×1
ELECTRODE REM PT RTRN 9FT ADLT (ELECTROSURGICAL) ×1 IMPLANT
GAUZE 4X4 16PLY ~~LOC~~+RFID DBL (SPONGE) ×1 IMPLANT
GAUZE STRETCH 2X75IN STRL (MISCELLANEOUS) IMPLANT
GLOVE BIO SURGEON STRL SZ7 (GLOVE) ×1 IMPLANT
GOWN STRL REUS W/ TWL LRG LVL3 (GOWN DISPOSABLE) ×2 IMPLANT
GOWN STRL REUS W/TWL LRG LVL3 (GOWN DISPOSABLE) ×2
MANIFOLD NEPTUNE II (INSTRUMENTS) ×1 IMPLANT
NS IRRIG 1000ML POUR BTL (IV SOLUTION) ×1 IMPLANT
PACK BASIN MINOR ARMC (MISCELLANEOUS) ×1 IMPLANT
PAD ABD DERMACEA PRESS 5X9 (GAUZE/BANDAGES/DRESSINGS) IMPLANT
SOL PREP PVP 2OZ (MISCELLANEOUS) ×1
SOLUTION PREP PVP 2OZ (MISCELLANEOUS) ×1 IMPLANT
SPONGE T-LAP 18X18 ~~LOC~~+RFID (SPONGE) ×1 IMPLANT
TRAP FLUID SMOKE EVACUATOR (MISCELLANEOUS) ×1 IMPLANT
WATER STERILE IRR 500ML POUR (IV SOLUTION) ×1 IMPLANT

## 2022-08-28 NOTE — Anesthesia Preprocedure Evaluation (Addendum)
Anesthesia Evaluation  Patient identified by MRN, date of birth, ID band Patient awake    Reviewed: Allergy & Precautions, H&P , NPO status , Patient's Chart, lab work & pertinent test results  Airway Mallampati: IV       Dental  (+) Poor Dentition, Chipped Very poor dentition, none loose, multiple missing and chipped:   Pulmonary sleep apnea , former smoker          Cardiovascular hypertension,   08-05-22. Left ventricular ejection fraction, by estimation, is 45 to 50%. Left  ventricular ejection fraction by 3D volume is 47 %. The left ventricle has  mildly decreased function. The left ventricle has no regional wall motion  abnormalities. There is  moderate to severe concentric left ventricular hypertrophy, speckled  appearance. Left ventricular diastolic parameters are consistent with  Grade I diastolic dysfunction (impaired relaxation). The average left  ventricular global longitudinal strain is  -18.8 %. Consider w/u for [facility] if clinically indicated.   2. Right ventricular systolic function is normal. The right ventricular  size is normal. Tricuspid regurgitation signal is inadequate for assessing  PA pressure.   3. Left atrial size was severely dilated.   4. Right atrial size was mildly dilated.   5. A small pericardial effusion is present. There is no evidence of  cardiac tamponade.   6. The mitral valve is normal in structure. Mild mitral valve  regurgitation. No evidence of mitral stenosis.   7. The aortic valve is tricuspid. Aortic valve regurgitation is not  visualized. Aortic valve sclerosis/calcification is present, without any  evidence of aortic stenosis.   8. The inferior vena cava is normal in size with greater than 50%  respiratory variability, suggesting right atrial pressure of 3 mmHg.   CAD-RADS 1. Minimal non-obstructive CAD (0-24%). Consider  preventive therapy and risk factor modification.   CTA  08-26-22    Neuro/Psych  Headaches  Neuromuscular disease negative neurological ROS  negative psych ROS   GI/Hepatic negative GI ROS, Neg liver ROS,,,  Endo/Other  diabetes, Insulin DependentHypothyroidism    Renal/GU Renal disease  negative genitourinary   Musculoskeletal negative musculoskeletal ROS (+) Arthritis ,    Abdominal   Peds negative pediatric ROS (+)  Hematology  (+) Blood dyscrasia, anemia CLL   Anesthesia Other Findings Na 137, K 3.7, cl 108, c02 22, glucose 94  WBC 12.5, hgb 8.2, hct 26.2, plat 161  PT 16.9, INR 1.4, PTT 42   Erectile dysfunction Dysplasia of prostate  Palindromic rheumatism, hand Chronic kidney disease Testicle swelling  Hypogonadism in male BPH (benign prostatic hyperplasia) Prostatitis Over weight Testicular mass Acute prostatitis  HTN (hypertension) Orchitis and epididymitis  CLL (chronic lymphocytic leukemia) (HCC) Diabetes (HCC)  Grade I diastolic dysfunction Mild mitral regurgitation by prior echocardiogram  Severe Left atrial dilation    Reproductive/Obstetrics negative OB ROS                             Anesthesia Physical Anesthesia Plan  ASA: 3  Anesthesia Plan: General   Post-op Pain Management:    Induction: Intravenous  PONV Risk Score and Plan:   Airway Management Planned: LMA  Additional Equipment:   Intra-op Plan:   Post-operative Plan: Extubation in OR  Informed Consent: I have reviewed the patients History and Physical, chart, labs and discussed the procedure including the risks, benefits and alternatives for the proposed anesthesia with the patient or authorized representative who has indicated his/her understanding  and acceptance.     Dental Advisory Given  Plan Discussed with: Anesthesiologist, CRNA and Surgeon  Anesthesia Plan Comments: (Patient consented for risks of anesthesia including but not limited to:  - adverse reactions to medications - damage to  eyes, teeth, lips or other oral mucosa - nerve damage due to positioning  - sore throat or hoarseness - Damage to heart, brain, nerves, lungs, other parts of body or loss of life  Patient voiced understanding.)        Anesthesia Quick Evaluation

## 2022-08-28 NOTE — Progress Notes (Signed)
Pharmacy Antibiotic Note  Paul Briggs. is a 77 y.o. male admitted on 08/26/2022 with cellulitis/abscess   Pharmacy has been consulted for Vancomycin and Zosyn dosing.  Plan: Zosyn 3.375gm IV q 8hrs Vancomycin 2500 mg IV LD given 8/2 @ 0443, then Vancomycin 1500 mg IV Q24H ordered to start on 8/3 @ 0200.   AUC = 501.4 Vanc trough = 10.8  Height: 6\' 1"  (185.4 cm) Weight: 104.8 kg (231 lb 0.7 oz) IBW/kg (Calculated) : 79.9  Temp (24hrs), Avg:98.9 F (37.2 C), Min:97.7 F (36.5 C), Max:100 F (37.8 C)  Recent Labs  Lab 08/22/22 0412 08/23/22 0502 08/24/22 0419 08/26/22 1146 08/26/22 2218 08/26/22 2359 08/28/22 0536  WBC 7.4 7.7 8.3 13.2*  --   --  12.5*  CREATININE 0.99 1.10 1.12 1.15  --   --  1.20  LATICACIDVEN  --   --   --   --  1.2 1.8  --     Estimated Creatinine Clearance: 66.6 mL/min (by C-G formula based on SCr of 1.2 mg/dL).    No Known Allergies  Antimicrobials this admission: 8/2 Vancomycin  >>  8/2 cefepime  >> 8/3 8/3 Zosyn >>  Microbiology results:  08/26/22 BCx: NGTD  08/28/22 UCx: NG    Thank you for allowing pharmacy to be a part of this patient's care.   Rodriguez-Guzman PharmD, BCPS 08/28/2022 1:08 PM

## 2022-08-28 NOTE — Progress Notes (Signed)

## 2022-08-28 NOTE — Transfer of Care (Signed)
Immediate Anesthesia Transfer of Care Note  Patient: Paul Ana [name]  Procedure(s) Performed: IRRIGATION AND DEBRIDEMENT ABSCESS LEFT UPPER THIGH/GROIN (Left)  Patient Location: PACU  Anesthesia Type:General  Level of Consciousness: awake, alert , and oriented  Airway & Oxygen Therapy: Patient Spontanous Breathing  Post-op Assessment: Report given to RN and Post -op Vital signs reviewed and stable  Post vital signs: Reviewed and stable  Last Vitals:  Vitals Value Taken Time  BP 156/71 08/28/22 1146  Temp    Pulse 66 08/28/22 1147  Resp 13 08/28/22 1147  SpO2 100 % 08/28/22 1147  Vitals shown include unfiled device data.  Last Pain:  Vitals:   08/28/22 0800  TempSrc:   PainSc: 0-No pain      Patients Stated Pain Goal: 0 (08/28/22 0630)  Complications: No notable events documented.

## 2022-08-28 NOTE — Progress Notes (Signed)
Patient not in room during morning rounds today.  Was taken to the OR.  Patient known to myself in the outpatient setting.  Presenting with mechanical fall.  Etiology appears noncardiac.  Restart outpatient cardiac medications when appropriate.  No inpatient cardiac workup indicated at this time.  Management of cellulitis as per primary team.  Recommend follow-up with cardiology upon discharge.  Signed, Debbe Odea, M.D. 08/28/22 [facility] [place], [place] [phone]

## 2022-08-28 NOTE — Progress Notes (Signed)
Progress Note   Patient: Paul Pilley [name] WJX:[id number] DOB: 08-30-1945 DOA: 08/26/2022     0 DOS: the patient was seen and examined on 08/28/2022   Brief hospital course: 77 y.o. 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 He presents with left groin pain. He apparently had falls at home with no loss of consciousness. He complains of a painful knot felt to the left upper thigh and groin area. He had low grade fever during this course of stay with Tmax of 100.15F  8/3 : Patient was evaluated by surgery underwent I&D of the developing groin abscess in the left thigh.  Surgery following  Assessment and Plan: Cellulitis/Induration of left groin Associated fever related to contusion.temperature of 100.9, without tachycardia WBC 15,000, up from 8000 on discharge a couple days prior, lactic acid 1.2 Suspected SIRS. Procalcitonin was 0.15 I agree with Cefepime for cellulitis/Induration.General surgery consulted to evaluate and advise if I&D may be warranted.CT :Subcutaneous fat stranding within the left lower quadrant anterior abdominal wall extending into the anterior proximal left thigh, likely related to contusion given history of recent trauma.No fluid collection or hematoma  Elevated troponin:  Known chronic LBBB,CAD,Continuous cardiac monitoring  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 CAD on coronary CTA.Continue on Toprol to replace amlodipine Continue to trend troponin.EKG was concerning to EDP Cardiology consult  Frequent fall/ presyncope: Consider revising opiates medications Patient had recent echo in July 2024 to evaluate her new LBBB and showed  cardiomyopathy with EF 45 to 50% and G1 DD Continuous cardiac monitoring Check orthostatics in a.m PTOT to evaluate patients gait prior to discharge  Essential hypertension: BP not at goal.Continue metoprolol and valsartan and hydralazine prn  Type II diabetes mellitus (HCC):Continue Farxiga: Fingerstick gluocse monitoring per protocol. Insulin sliding scale   Hypothyroidism: Continue levothyroxine  IDA (iron deficiency anemia): Hemoglobin at baseline at 8.1.Daily H/h monitoring. Recent history of GI blood loss. Transfuse if HB< 7  CLL (chronic lymphocytic leukemia) (HCC): Followed by oncology. 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 Revised dosing as appropriate.Continue oxycodone, Lyrica  OSA on CPAP: CPAP nightly  Subjective: Patient feels better He still has significant pain in his left groin. Its tender to touch and limited motion noted.Tmax of 100.8 this morning  Physical Exam: Vitals:   08/28/22 0630 08/28/22 0827 08/28/22 1145 08/28/22 1200  BP: (!) 163/83 (!) 163/74 (!) 156/71 (!) 168/78  Pulse:  63 70 74  Resp: 12 18 12 11   Temp:  99.6 F (37.6 C) 97.7 F (36.5 C)   TempSrc:      SpO2:  97% 100% 98%  Weight:      Height:       Patient is alert and oriented.Looks well without any distress HEENT:Head atraumatic. Neck supple. No JVD CHEST: Clinically cleat Abdomen: LLQ pain w/o ecchymosis or heamtoima. Left groin induration++ CVS: S1,S2 wnm or gallops CNS: No focal deficits Skin: Negative for rash  Data Reviewed: HB 8.1 WBC 13.2 Plt 158 Neutrophil 67 Glucose 163   .CT scan revealed Subcutaneous fat stranding within the left lower quadrant anterior abdominal wall extending into the anterior proximal left thigh, likely related to contusion given history of recent trauma. No fluid collection or hematoma.  2. No acute displaced fracture. 3. Retroperitoneal and pelvic lymphadenopathy as above, with  waxing and waning appearance. 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 4. 1.9 cm hypodensity within the medial aspect of the spleen, nonspecific. Leukemic involvement of the spleen cannot be excluded. 5. Distal colonic diverticulosis without diverticulitis. 6.  Aortic Atherosclerosis (ICD10-I70.0).   Family Communication: Wife at bedside  Disposition: Status is: Observation The patient will require care spanning > 2 midnights and should be moved to inpatient because: Fever and findings concerning for abscess+  Planned Discharge Destination: Home  Time spent: 35 minutes  Author: Kirstie Peri, MD 08/28/2022 12:16 PM  For on call review [email or web address].

## 2022-08-28 NOTE — Plan of Care (Signed)
  Problem: Education: Goal: Knowledge of condition and prescribed therapy will improve Outcome: Progressing   Problem: Clinical Measurements: Goal: Signs and symptoms of infection will decrease Outcome: Progressing   Problem: Respiratory: Goal: Ability to maintain adequate ventilation will improve Outcome: Progressing   Problem: Clinical Measurements: Goal: Cardiovascular complication will be avoided Outcome: Progressing   Problem: Pain Managment: Goal: General experience of comfort will improve Outcome: Progressing   Problem: Safety: Goal: Ability to remain free from injury will improve Outcome: Progressing

## 2022-08-28 NOTE — Anesthesia Procedure Notes (Signed)
Procedure Name: LMA Insertion Date/Time: 08/28/2022 11:03 AM  Performed by: Karoline Caldwell, CRNAPre-anesthesia Checklist: Patient identified, Patient being monitored, Timeout performed, Emergency Drugs available and Suction available Patient Re-evaluated:Patient Re-evaluated prior to induction Oxygen Delivery Method: Circle system utilized Preoxygenation: Pre-oxygenation with 100% oxygen Induction Type: IV induction Ventilation: Mask ventilation without difficulty LMA: LMA inserted LMA Size: 5.0 Tube type: Oral Number of attempts: 1 Placement Confirmation: positive ETCO2 and breath sounds checked- equal and bilateral Tube secured with: Tape Dental Injury: Teeth and Oropharynx as per pre-operative assessment

## 2022-08-28 NOTE — Anesthesia Postprocedure Evaluation (Signed)
Anesthesia Post Note  Patient: Paul Venne [name]  Procedure(s) Performed: IRRIGATION AND DEBRIDEMENT ABSCESS LEFT UPPER THIGH/GROIN (Left)  Patient location during evaluation: PACU Anesthesia Type: General Level of consciousness: awake and alert Pain management: pain level controlled Vital Signs Assessment: post-procedure vital signs reviewed and stable Respiratory status: spontaneous breathing, nonlabored ventilation, respiratory function stable and patient connected to nasal cannula oxygen Cardiovascular status: blood pressure returned to baseline and stable Postop Assessment: no apparent nausea or vomiting Anesthetic complications: no   No notable events documented.   Last Vitals:  Vitals:   08/28/22 1215 08/28/22 1230  BP: (!) 168/77 (!) 165/81  Pulse: 66 75  Resp: 13   Temp: 36.7 C   SpO2: 95% 94%    Last Pain:  Vitals:   08/28/22 1230  TempSrc:   PainSc: 4                   C 

## 2022-08-28 NOTE — Op Note (Addendum)
  08/28/2022  11:39 AM  PATIENT:  Paul Ana [name]  77 y.o. male  PRE-OPERATIVE DIAGNOSIS:  left thigh abscess  POST-OPERATIVE DIAGNOSIS:  Same  PROCEDURE:   1. Incision and drainage of complex  thigh abscess deep  2. Excisional debridement of skin subcutaneous tissue down to fascia and muscle measuring 15 square centimeters    SURGEON:  Surgeons and Role:    * , Merri Ray, MD - Primary  ANESTHESIA: GETA  FINDINGS: abscess intramuscular left thigh Deep  DICTATION:  Patient was explained about the  procedure in detail, risks, benefits and possible complications and a consent was obtained. The patient taken to the operating room and placed in the supine position.   Incision was created  left thigh, abscess was dep and fascia incised . Pus was drained and found to be below the fascia in between Sartorious and rectus femoris muscle, There were complex luculations that we were able to lyse with a combination of finger fracture and suction device. All the loculations were broken down. Using a sharp curette we debrided the sub q tissue down to the muscle to include fascia. Hemostasis was obtained with electrocautery. Irrigation with normal saline and the wound was packed with inch packing.  Needle and laparotomy counts were correct and there were no immediate complications Given complex abscess and location I do think potential clostridium needs to be taken into account.we will switch to zosyn.    Ronnette Juniper, MD

## 2022-08-29 ENCOUNTER — Encounter: Payer: Self-pay | Admitting: Surgery

## 2022-08-29 DIAGNOSIS — L03314 Cellulitis of groin: Secondary | ICD-10-CM

## 2022-08-29 LAB — CBC WITH DIFFERENTIAL/PLATELET
Abs Immature Granulocytes: 0.04 10*3/uL (ref 0.00–0.07)
Basophils Absolute: 0.1 10*3/uL (ref 0.0–0.1)
Basophils Relative: 1 %
Eosinophils Absolute: 0.3 10*3/uL (ref 0.0–0.5)
Eosinophils Relative: 4 %
HCT: 24.7 % — ABNORMAL LOW (ref 39.0–52.0)
Hemoglobin: 7.5 g/dL — ABNORMAL LOW (ref 13.0–17.0)
Immature Granulocytes: 1 %
Lymphocytes Relative: 21 %
Lymphs Abs: 1.8 10*3/uL (ref 0.7–4.0)
MCH: 27.2 pg (ref 26.0–34.0)
MCHC: 30.4 g/dL (ref 30.0–36.0)
MCV: 89.5 fL (ref 80.0–100.0)
Monocytes Absolute: 1.8 10*3/uL — ABNORMAL HIGH (ref 0.1–1.0)
Monocytes Relative: 21 %
Neutro Abs: 4.7 10*3/uL (ref 1.7–7.7)
Neutrophils Relative %: 52 %
Platelets: 185 10*3/uL (ref 150–400)
RBC: 2.76 MIL/uL — ABNORMAL LOW (ref 4.22–5.81)
RDW: 18.2 % — ABNORMAL HIGH (ref 11.5–15.5)
Smear Review: NORMAL
WBC: 8.7 10*3/uL (ref 4.0–10.5)
nRBC: 0 % (ref 0.0–0.2)

## 2022-08-29 LAB — BASIC METABOLIC PANEL WITH GFR
Anion gap: 7 (ref 5–15)
BUN: 18 mg/dL (ref 8–23)
CO2: 22 mmol/L (ref 22–32)
Calcium: 7.9 mg/dL — ABNORMAL LOW (ref 8.9–10.3)
Chloride: 109 mmol/L (ref 98–111)
Creatinine, Ser: 1.25 mg/dL — ABNORMAL HIGH (ref 0.61–1.24)
GFR, Estimated: 60 mL/min — ABNORMAL LOW (ref >60)
Glucose, Bld: 189 mg/dL — ABNORMAL HIGH (ref 70–99)
Potassium: 3.3 mmol/L — ABNORMAL LOW (ref 3.5–5.1)
Sodium: 138 mmol/L (ref 135–145)

## 2022-08-29 LAB — GLUCOSE, CAPILLARY
Glucose-Capillary: 139 mg/dL — ABNORMAL HIGH (ref 70–99)
Glucose-Capillary: 149 mg/dL — ABNORMAL HIGH (ref 70–99)
Glucose-Capillary: 104 mg/dL — ABNORMAL HIGH (ref 70–99)
Glucose-Capillary: 116 mg/dL — ABNORMAL HIGH (ref 70–99)
Glucose-Capillary: 127 mg/dL — ABNORMAL HIGH (ref 70–99)

## 2022-08-29 MED ORDER — DM-GUAIFENESIN ER 30-600 MG PO TB12
1.0000 | ORAL_TABLET | Freq: Two times a day (BID) | ORAL | Status: DC
  Administered 2022-08-29 – 2022-09-01 (×7): 1 via ORAL
  Filled 2022-08-29 (×7): qty 1

## 2022-08-29 MED ORDER — POTASSIUM CHLORIDE CRYS ER 20 MEQ PO TBCR
40.0000 meq | EXTENDED_RELEASE_TABLET | ORAL | Status: AC
  Administered 2022-08-29 (×2): 40 meq via ORAL
  Filled 2022-08-29 (×2): qty 2

## 2022-08-29 NOTE — Plan of Care (Signed)
°  Problem: Education: Goal: Knowledge of General Education information will improve Description: Including pain rating scale, medication(s)/side effects and non-pharmacologic comfort measures Outcome: Progressing   Problem: Clinical Measurements: Goal: Will remain free from infection Outcome: Progressing   Problem: Clinical Measurements: Goal: Respiratory complications will improve Outcome: Progressing   Problem: Clinical Measurements: Goal: Cardiovascular complication will be avoided Outcome: Progressing   Problem: Elimination: Goal: Will not experience complications related to urinary retention Outcome: Progressing   Problem: Pain Managment: Goal: General experience of comfort will improve Outcome: Progressing   Problem: Safety: Goal: Ability to remain free from injury will improve Outcome: Progressing

## 2022-08-29 NOTE — Progress Notes (Addendum)
Progress Note   Patient: Paul Jablonowski [name] AVW:[id number] DOB: 03/25/1945 DOA: 08/26/2022     1 DOS: the patient was seen and examined on 08/29/2022    Subjective:  Patient seen and examined at bedside this morning Denies nausea vomiting abdominal pain chest pain or cough He admits to discomfort in the left groin area at the site of incision and drainage  Brief hospital course: 77 y.o. 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 He presents with left groin pain. He apparently had falls at home with no loss of consciousness. He complains of a painful knot felt to the left upper thigh and groin area. He had low grade fever during this course of stay with Tmax of 100.4F    Assessment and Plan: Sepsis secondary to cellulitis/Induration of left groin-present on admission Patient presented with a fever of 100.9 in the setting of leukocytosis 15,000 meeting sepsis criteria I have reviewed the CT scan results showing findings of left lower abdominal wall fat stranding into the anterior left thigh WBC 15,000, up from 8000 on discharge a couple days prior, lactic acid 1.2 Suspected SIRS. Procalcitonin was 0.15 Patient underwent incision and drainage done by general surgeon on 08/28/2022 Surgeon on board and case discussed Continue to follow-up on culture results Continue as needed pain medications Continue current antibiotics Continue wound care closely  Elevated troponin:   Known chronic LBBB,CAD Patient denies any chest pains Continue monitoring Likely troponin elevation is due to demand ischemia Cardiologist on board we appreciate input  HFmrEF (mildly reduced EF )2/2 cardiomyopathy (EF 45-50%  07/2022),Adequately compensated. CAD on coronary CTA.Continue on continue Toprol to replace amlodipine Continue to trend troponin.EKG was concerning to EDP Cardiology on board and currently not planning any inpatient cardiac workup and recommends outpatient follow-up at discharge    Frequent fall/ presyncope: Consider revising opiates medications Patient had recent echo in July 2024 to evaluate her new LBBB and showed cardiomyopathy with EF 45 to 50% and G1 DD Monitor orthostatics Continue telemetry monitoring PT OT requested   Essential hypertension: BP not at goal.Continue metoprolol and valsartan and hydralazine prn   Type II diabetes mellitus (HCC):Continue Farxiga:  Continue to monitor fingerstick gluocse  Continue current insulin therapy  Hypothyroidism: Continue levothyroxine   IDA (iron deficiency anemia): Hemoglobin at baseline at 8.1.Daily H/h monitoring. Recent history of GI blood loss. Transfuse if HB< 7   CLL (chronic lymphocytic leukemia) (HCC): Followed by oncology. On Brukinsa 80 mg twice daily   Chronic radicular lumbar pain: Chronic continuous use of opiates. Revised dosing as appropriate.Continue oxycodone, Lyrica   OSA on CPAP: CPAP nightly      Physical Exam: Elderly male seen in no acute distress HEENT:Head atraumatic. Neck supple. No JVD CHEST: Clinically cleat Abdomen: LLQ pain w/o ecchymosis or heamtoima.  Left groin dressing appears clean and dry CVS: S1,S2 wnm or gallops CNS: No focal deficits Skin: Negative for rash     Data Reviewed: I have reviewed the surgeon documentation, previous hospitalist documentation, transition of care manager documentation, cardiologist documentation, patient's labs including potassium showing hypokalemia which is being repleted, including worsening renal function as well as decreased hemoglobin  Time spent: 40 minutes Vitals:   08/29/22 0306 08/29/22 0924 08/29/22 1152 08/29/22 1544  BP: 135/61 (!) 155/58 139/71  137/67  Pulse:  (!) 59 60 (!) 58  Resp: 20 16 16 16   Temp: 98.4 F (36.9 C) 98.9 F (37.2 C) 99.5 F (37.5 C) 99.1 F (37.3 C)  TempSrc: Oral Oral Oral Oral  SpO2: 99% 96% 97% 99%  Weight:      Height:         Author: Loyce Dys, MD 08/29/2022 4:31 PM  For on call review [email or web address].

## 2022-08-29 NOTE — Plan of Care (Signed)
  Problem: Education: Goal: Ability to describe self-care measures that may prevent or decrease complications (Diabetes Survival Skills Education) will improve Outcome: Progressing Goal: Individualized Educational Video(s) Outcome: Progressing   Problem: Coping: Goal: Ability to adjust to condition or change in health will improve Outcome: Progressing   Problem: Fluid Volume: Goal: Ability to maintain a balanced intake and output will improve Outcome: Progressing   Problem: Health Behavior/Discharge Planning: Goal: Ability to identify and utilize available resources and services will improve Outcome: Progressing Goal: Ability to manage health-related needs will improve Outcome: Progressing   Problem: Metabolic: Goal: Ability to maintain appropriate glucose levels will improve Outcome: Progressing   Problem: Nutritional: Goal: Maintenance of adequate nutrition will improve Outcome: Progressing Goal: Progress toward achieving an optimal weight will improve Outcome: Progressing   Problem: Skin Integrity: Goal: Risk for impaired skin integrity will decrease Outcome: Progressing   Problem: Tissue Perfusion: Goal: Adequacy of tissue perfusion will improve Outcome: Progressing   Problem: Education: Goal: Knowledge of condition and prescribed therapy will improve Outcome: Progressing   Problem: Cardiac: Goal: Will achieve and/or maintain adequate cardiac output Outcome: Progressing   Problem: Physical Regulation: Goal: Complications related to the disease process, condition or treatment will be avoided or minimized Outcome: Progressing   Problem: Fluid Volume: Goal: Hemodynamic stability will improve Outcome: Progressing   Problem: Clinical Measurements: Goal: Diagnostic test results will improve Outcome: Progressing Goal: Signs and symptoms of infection will decrease Outcome: Progressing   Problem: Respiratory: Goal: Ability to maintain adequate ventilation will  improve Outcome: Progressing   Problem: Education: Goal: Knowledge of General Education information will improve Description: Including pain rating scale, medication(s)/side effects and non-pharmacologic comfort measures Outcome: Progressing   Problem: Health Behavior/Discharge Planning: Goal: Ability to manage health-related needs will improve Outcome: Progressing   Problem: Clinical Measurements: Goal: Ability to maintain clinical measurements within normal limits will improve Outcome: Progressing Goal: Will remain free from infection Outcome: Progressing Goal: Diagnostic test results will improve Outcome: Progressing Goal: Respiratory complications will improve Outcome: Progressing Goal: Cardiovascular complication will be avoided Outcome: Progressing   Problem: Activity: Goal: Risk for activity intolerance will decrease Outcome: Progressing   Problem: Nutrition: Goal: Adequate nutrition will be maintained Outcome: Progressing   Problem: Coping: Goal: Level of anxiety will decrease Outcome: Progressing   Problem: Elimination: Goal: Will not experience complications related to bowel motility Outcome: Progressing Goal: Will not experience complications related to urinary retention Outcome: Progressing   Problem: Pain Managment: Goal: General experience of comfort will improve Outcome: Progressing   Problem: Safety: Goal: Ability to remain free from injury will improve Outcome: Progressing   Problem: Skin Integrity: Goal: Risk for impaired skin integrity will decrease Outcome: Progressing

## 2022-08-29 NOTE — Plan of Care (Signed)
  Problem: Education: Goal: Knowledge of condition and prescribed therapy will improve Outcome: Progressing   Problem: Clinical Measurements: Goal: Signs and symptoms of infection will decrease Outcome: Progressing   Problem: Respiratory: Goal: Ability to maintain adequate ventilation will improve Outcome: Progressing   Problem: Clinical Measurements: Goal: Cardiovascular complication will be avoided Outcome: Progressing   Problem: Elimination: Goal: Will not experience complications related to bowel motility Outcome: Progressing   Problem: Elimination: Goal: Will not experience complications related to urinary retention Outcome: Progressing   Problem: Pain Managment: Goal: General experience of comfort will improve Outcome: Progressing   Problem: Safety: Goal: Ability to remain free from injury will improve Outcome: Progressing

## 2022-08-29 NOTE — TOC Initial Note (Signed)
Transition of Care (TOC) - Initial/Assessment Note    Patient Details  Name: Paul Abraha [name] MRN: [id number] Date of Birth: 22-Aug-1945  Transition of Care [facility]) CM/SW Contact:    Liliana Cline, LCSW Phone Number: 08/29/2022, 10:58 AM  Clinical Narrative:                 Patient has a high readmission risk score. CSW spoke with patinet for assessment. Patient states he lives home with his wife. At baseline, he drives.  PCP is listed as Debbra Riding. Patient states he has not been to a PCP in a while but plans to call [facility] for an appointment.  Pharmacy is [facility] [address].  Patient denies DME history. States he had HH once in the past.  Patient denies needs at this time. TOC to continue to follow.   Expected Discharge Plan: Home/Self Care Barriers to Discharge: Continued Medical Work up   Patient Goals and CMS Choice Patient states their goals for this hospitalization and ongoing recovery are:: to return home when medically ready CMS Medicare.gov Compare Post Acute Care list provided to:: Patient Choice offered to / list presented to : Patient      Expected Discharge Plan and Services       Living arrangements for the past 2 months: Single Family Home                                      Prior Living Arrangements/Services Living arrangements for the past 2 months: Single Family Home Lives with:: Spouse Patient language and need for interpreter reviewed:: Yes Do you feel safe going back to the place where you live?: Yes      Need for Family Participation in Patient Care: Yes (Comment) Care giver support system in place?: Yes (comment)   Criminal Activity/Legal Involvement Pertinent to Current Situation/Hospitalization: No - Comment as needed  Activities of Daily Living Home Assistive Devices/Equipment: None ADL Screening (condition at time of admission) Patient's cognitive ability adequate to safely complete daily  activities?: Yes Is the patient deaf or have difficulty hearing?: No Does the patient have difficulty seeing, even when wearing glasses/contacts?: No Does the patient have difficulty concentrating, remembering, or making decisions?: No Patient able to express need for assistance with ADLs?: No Does the patient have difficulty dressing or bathing?: No Independently performs ADLs?: Yes (appropriate for developmental age) Does the patient have difficulty walking or climbing stairs?: No Weakness of Legs: Both Weakness of Arms/Hands: None  Permission Sought/Granted Permission sought to share information with : Oceanographer granted to share information with : Yes, Verbal Permission Granted     Permission granted to share info w AGENCY: if needed        Emotional Assessment       Orientation: : Oriented to Self, Oriented to Situation, Oriented to Place, Oriented to  Time Alcohol / Substance Use: Not Applicable Psych Involvement: No (comment)  Admission diagnosis:  Elevated troponin [R79.89] Frequent falls [R29.6] Fall, initial encounter [W19.XXXA] Hematoma of groin, initial encounter [S30.1XXA] Patient Active Problem List   Diagnosis Date Noted   Abscess of muscle of thigh 08/28/2022   Cellulitis of left groin 08/27/2022   Fall 08/27/2022   Abscess of left groin 08/27/2022   Frequent falls 08/26/2022   Elevated troponin 08/26/2022   Contusion of groin, left, initial encounter 08/26/2022   Chronic, continuous use  of opioids 08/26/2022   Cardiomyopathy (HCC) 08/26/2022   Acute on chronic anemia 08/20/2022   Cough 08/06/2022   Transaminitis 05/21/2022   Hypokalemia 05/21/2022   Primary osteoarthritis of right shoulder 02/25/2022   Chronic right shoulder pain 02/25/2022   Cervical radicular pain 02/25/2022   Encounter for antineoplastic chemotherapy 02/06/2022   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/01/2022   Goals of care,  counseling/discussion 01/01/2022   IDA (iron deficiency anemia) 01/01/2022   Right ankle pain 12/25/2021   Leukocytosis 12/25/2021   Type II diabetes mellitus (HCC) 12/25/2021   Acute renal failure superimposed on stage 3a chronic kidney disease (HCC) 12/25/2021   HLD (hyperlipidemia) 12/25/2021   HTN (hypertension) 12/25/2021   Obesity with body mass index (BMI) of 30.0 to 39.9 12/25/2021   Right knee pain 12/25/2021   Left wrist pain 12/25/2021   Cellulitis of right lower extremity 12/25/2021   Septic infrapatellar bursitis of right knee 12/25/2021   Class 1 obesity without serious comorbidity with body mass index (BMI) of 30.0 to 30.9 in adult 10/02/2021   Encounter for long-term opiate analgesic use 06/04/2021   Chronic radicular lumbar pain 04/07/2021   Lumbar facet arthropathy 04/07/2021   Lumbar degenerative disc disease 04/07/2021   Primary osteoarthritis of left shoulder 11/06/2020   Left rotator cuff tear arthropathy 11/06/2020   Spinal stenosis, lumbar region, with neurogenic claudication 11/06/2020   Chronic left shoulder pain 11/06/2020   Chronic pain syndrome 11/06/2020   Pain management contract signed 11/06/2020   OSA on CPAP 08/25/2020   CPAP use counseling 08/25/2020   Disorder of rotator cuff, left 06/10/2020   OSA (obstructive sleep apnea) 05/05/2020   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   COVID 02/19/2020   Headache above the eye region 09/11/2019   Adrenal nodule (HCC) 10/28/2015   Subdural hematoma (HCC) 10/24/2015   Sepsis (HCC) 07/19/2015   Essential hypertension 07/19/2015   Diabetes mellitus (HCC) 07/19/2015   Hypothyroidism 07/19/2015   Peripheral neuropathy 07/19/2015   Chronic back pain 07/19/2015   CKD (chronic kidney disease) stage 3, GFR 30-59 ml/min (HCC) 07/19/2015   Enlarged prostate 03/25/2015   Urine stream spraying 03/25/2015   BPH with obstruction/lower urinary tract symptoms 09/18/2014   Other  male erectile dysfunction 09/18/2014   Sleep apnea 04/04/2014   PCP:  Wilford Corner, PA-C Pharmacy:   [facility] [address] ([address]), [place] - [address] [place]) [place] [address] Phone: [phone] Fax: [phone]  [facility] - [place], [place] - [address] [place] [address] Phone: [phone] Fax: [phone]  [facility] - [place], [place] - [address] [place] [address] Phone: [phone] Fax: [phone]  [facility] [address] [place] [address] Phone: [phone] Fax: [phone]     Social Determinants of Health (SDOH) Social History: SDOH Screenings   Food Insecurity: No Food Insecurity (08/26/2022)  Recent Concern: Food Insecurity - Food Insecurity Present (07/26/2022)  Housing: Low Risk  (08/26/2022)  Transportation Needs: No Transportation Needs (08/26/2022)  Utilities: Not At Risk (08/26/2022)  Alcohol Screen: Low Risk  (01/22/2022)  Depression (PHQ2-9): Low Risk  (08/19/2022)  Financial Resource Strain: Medium Risk (07/26/2022)  Physical Activity: Unknown (07/26/2022)  Social Connections: Moderately Integrated (07/26/2022)  Stress: Stress Concern Present (07/26/2022)  Tobacco Use: Medium Risk (08/26/2022)   SDOH Interventions:  Readmission Risk Interventions    08/29/2022   10:57 AM  Readmission Risk Prevention Plan  Transportation Screening Complete  Medication Review (RN Care Manager) Complete  PCP or Specialist appointment within 3-5 days of discharge Complete  HRI or Home Care Consult Complete  SW Recovery Care/Counseling Consult Complete  Palliative Care Screening Not Applicable  Skilled Nursing Facility Complete

## 2022-08-29 NOTE — Plan of Care (Signed)
  Problem: Education: Goal: Knowledge of condition and prescribed therapy will improve Outcome: Progressing   Problem: Clinical Measurements: Goal: Signs and symptoms of infection will decrease Outcome: Progressing   Problem: Clinical Measurements: Goal: Will remain free from infection Outcome: Progressing   Problem: Clinical Measurements: Goal: Respiratory complications will improve 08/29/2022 0317 by Myles Gip, RN Outcome: Progressing 08/29/2022 0232 by Myles Gip, RN Outcome: Progressing   Problem: Clinical Measurements: Goal: Cardiovascular complication will be avoided 08/29/2022 0317 by Myles Gip, RN Outcome: Progressing 08/29/2022 0232 by Myles Gip, RN Outcome: Progressing   Problem: Elimination: Goal: Will not experience complications related to urinary retention Outcome: Progressing   Problem: Pain Managment: Goal: General experience of comfort will improve 08/29/2022 0317 by Myles Gip, RN Outcome: Progressing   Problem: Safety: Goal: Ability to remain free from injury will improve 08/29/2022 0317 by Myles Gip, RN Outcome: Progressing 08/29/2022 0232 by Myles Gip, RN Outcome: Progressing

## 2022-08-29 NOTE — Progress Notes (Signed)
POD # 1 Complex left groin abscess Packing removed and replaced by me, RN taught Gram + cocci from OR culture Gram + rods from blood  PE NAD LEft thigh w induration and cellulitis, slowly improving  A/p Continue zosyn and vanc Continue wound care Awaiting cult sens

## 2022-08-30 ENCOUNTER — Other Ambulatory Visit: Payer: No Typology Code available for payment source

## 2022-08-30 ENCOUNTER — Other Ambulatory Visit: Payer: Self-pay

## 2022-08-30 ENCOUNTER — Other Ambulatory Visit (HOSPITAL_COMMUNITY): Payer: Self-pay

## 2022-08-30 DIAGNOSIS — L03314 Cellulitis of groin: Secondary | ICD-10-CM | POA: Diagnosis not present

## 2022-08-30 LAB — GLUCOSE, CAPILLARY
Glucose-Capillary: 94 mg/dL (ref 70–99)
Glucose-Capillary: 88 mg/dL (ref 70–99)
Glucose-Capillary: 131 mg/dL — ABNORMAL HIGH (ref 70–99)
Glucose-Capillary: 138 mg/dL — ABNORMAL HIGH (ref 70–99)
Glucose-Capillary: 82 mg/dL (ref 70–99)
Glucose-Capillary: 136 mg/dL — ABNORMAL HIGH (ref 70–99)
Glucose-Capillary: 92 mg/dL (ref 70–99)

## 2022-08-30 MED ORDER — SODIUM CHLORIDE 0.9 % IV SOLN
3.0000 g | Freq: Four times a day (QID) | INTRAVENOUS | Status: DC
  Administered 2022-08-30 – 2022-09-01 (×9): 3 g via INTRAVENOUS
  Filled 2022-08-30 (×10): qty 8

## 2022-08-30 MED ORDER — ENOXAPARIN SODIUM 60 MG/0.6ML IJ SOSY
55.0000 mg | PREFILLED_SYRINGE | INTRAMUSCULAR | Status: DC
  Administered 2022-08-30 – 2022-08-31 (×2): 55 mg via SUBCUTANEOUS
  Filled 2022-08-30 (×2): qty 0.6

## 2022-08-30 MED ORDER — LINEZOLID 600 MG/300ML IV SOLN
600.0000 mg | Freq: Two times a day (BID) | INTRAVENOUS | Status: DC
  Administered 2022-08-30 – 2022-09-01 (×4): 600 mg via INTRAVENOUS
  Filled 2022-08-30 (×4): qty 300

## 2022-08-30 NOTE — Progress Notes (Signed)
[facility] SURGICAL PROGRESS NOTE  Hospital Day(s): 2.   Post op day(s): 2 Days Post-Op.   Interval History:  Patient seen and examined No acute events or new complaints overnight.  Patient reports continued pain at left groin I&D site No fever, chills  Leukocytosis remains resolved; 8.0K Hgb to 7.4; stable Cx with Staph aureus; susceptibilities pending Vancomycin & Zosyn   Vital signs in last 24 hours: [min-max] current  Temp:  [98.6 F (37 C)-99.5 F (37.5 C)] 98.6 F (37 C) (08/04 2325) Pulse Rate:  [56-64] 56 (08/05 0438) Resp:  [15-18] 16 (08/05 0438) BP: (137-155)/(58-73) 142/73 (08/05 0438) SpO2:  [96 %-99 %] 98 % (08/05 0438) Weight:  [105.7 kg] 105.7 kg (08/05 0500)     Height: 6\' 1"  (185.4 cm) Weight: 105.7 kg BMI (Calculated): 30.75   Intake/Output last 2 shifts:  08/04 0701 - 08/05 0700 In: 988.6 [P.O.:480; I.V.:90.7; IV Piggyback:418] Out: 1760 [Urine:1760]   Physical Exam:  Constitutional: alert, cooperative and no distress  Respiratory: breathing non-labored at rest  Cardiovascular: regular rate and sinus rhythm  Integumentary: I&D site to left groin, this is clean, erythema improved, drainage on dressing serous  Labs:     Latest Ref Rng & Units 08/30/2022    5:51 AM 08/29/2022    4:43 AM 08/28/2022    5:36 AM  CBC  WBC 4.0 - 10.5 K/uL 8.0  8.7  12.5   Hemoglobin 13.0 - 17.0 g/dL 7.4  7.5  8.2   Hematocrit 39.0 - 52.0 % 23.6  24.7  26.2   Platelets 150 - 400 K/uL 194  185  161       Latest Ref Rng & Units 08/30/2022    5:51 AM 08/29/2022    4:43 AM 08/28/2022    5:36 AM  CMP  Glucose 70 - 99 mg/dL 130  865  94   BUN 8 - 23 mg/dL 14  18  15    Creatinine 0.61 - 1.24 mg/dL 7.84  6.96  2.95   Sodium 135 - 145 mmol/L 140  138  137   Potassium 3.5 - 5.1 mmol/L 3.9  3.3  3.7   Chloride 98 - 111 mmol/L 113  109  108   CO2 22 - 32 mmol/L 20  22  23    Calcium 8.9 - 10.3 mg/dL 7.8  7.9  8.0      Imaging studies: No new pertinent imaging  studies   Assessment/Plan: 77 y.o. male 2 Days Post-Op s/p I&D of complex left thigh abscess   - Continue pain control prn  - Continue local dressing changes; Pack with 1 inch packing strips; cover, secure. Change daily and as needed  - Continue IV Abx (Zosyn, Vancomycin); follow up Cx   - Okay to mobilize - Further management per primary service; we will follow     - Discharge Planning: Clinically doing well, awaiting Cx results. Recommend at least another 24 hours for IV Abx and pain control.    All of the above findings and recommendations were discussed with the patient, patient's family at bedside, and the medical team, and all of patient's and family's questions were answered to their expressed satisfaction.  -- Lynden Oxford, PA-C [facility] 08/30/2022, 7:58 AM M-F: 7am - 4pm

## 2022-08-30 NOTE — TOC Benefit Eligibility Note (Signed)
Pharmacy Patient Advocate Encounter  Insurance verification completed.    The patient is insured through [employer] Medicare Part D  Ran test claim for linezolid (Zyvox) 600 mg tablets and the current 10 day co-pay is $0.00.   This test claim was processed through [facility]- copay amounts may vary at other pharmacies due to pharmacy/plan contracts, or as the patient moves through the different stages of their insurance plan.    Roland Earl, CPHT Pharmacy Patient Advocate Specialist [facility] Patient Advocate Team Direct Number: [phone]  Fax: [phone]

## 2022-08-30 NOTE — Progress Notes (Signed)
Progress Note   Patient: Paul Wallock [name] WUX:[id number] DOB: 1945-06-05 DOA: 08/26/2022     2 DOS: the patient was seen and examined on 08/30/2022     Subjective:  Patient seen and examined in the presence of the wife this morning He tells me he is improving Wound culture results growing some bacteria however pending identification and sensitivity Denies abdominal pain chest pain cough nausea vomiting  Brief hospital course: 77 y.o. 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 He presents with left groin pain. He apparently had falls at home with no loss of consciousness. He complains of a painful knot felt to the left upper thigh and groin area. He had low grade fever during this course of stay with Tmax of 100.9F     Assessment and Plan: Sepsis secondary to cellulitis/Induration of left groin-present on admission Patient presented with a fever of 100.9 in the setting of leukocytosis 15,000 meeting sepsis criteria I have reviewed the CT scan results showing findings of left lower abdominal wall fat stranding into the anterior left thigh WBC 15,000, up from 8000 on discharge a couple days prior, lactic acid 1.2 Suspected SIRS. 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 CAD on coronary CTA.Continue on continue Toprol Denies any chest pain Cardiology on board and currently not planning any inpatient cardiac workup and recommends outpatient follow-up at discharge     Frequent fall/ presyncope: Consider revising opiates medications Patient had recent echo in July 2024 to evaluate her new LBBB and showed cardiomyopathy with EF 45 to 50% and G1 DD Monitor orthostatics closely Continue telemetry monitoring PT OT on board we appreciate input   Essential hypertension: BP not at goal.Continue metoprolol and valsartan and hydralazine prn   Type II diabetes mellitus (HCC):Continue Farxiga:  Continue to monitor fingerstick gluocse  Continue current insulin therapy   Hypothyroidism: Continue levothyroxine   IDA (iron deficiency anemia): Hemoglobin at baseline at 8.1.Daily H/h monitoring. Recent history of GI blood loss. Transfuse if HB< 7   CLL (chronic lymphocytic leukemia) (HCC): Followed by oncology. On Brukinsa 80 mg twice daily   Chronic radicular lumbar pain: Chronic continuous use of opiates. Revised dosing as appropriate.Continue oxycodone, Lyrica   OSA on CPAP: Continue CPAP at night       Physical Exam: Elderly male seen in no acute distress HEENT:Head atraumatic. Neck supple. No JVD CHEST: Clinically cleat Abdomen: LLQ pain w/o ecchymosis or heamtoima left groin dressing clean and dry CVS: S1,S2 wnm or gallops CNS: No focal deficits Skin: Negative for rash       Data Reviewed: I have reviewed patient's lab including CBC, BMP, nursing documentation, surgeon documentation as well as patient's vitals     Vitals:   08/30/22 0500 08/30/22 0805 08/30/22 1132 08/30/22 1612  BP:  135/66 Marland Kitchen)  148/73 (!) 154/79  Pulse:  60 (!) 54 64  Resp:  18 20 20   Temp:  98.4 F (36.9 C)  98.1 F (36.7 C) 97.9 F (36.6 C)  TempSrc:  Oral    SpO2:   99% 97%  Weight: 105.7 kg     Height:        Author: Loyce Dys, MD 08/30/2022 6:25 PM  For on call review [email or web address].

## 2022-08-30 NOTE — Evaluation (Signed)
Occupational Therapy Evaluation Patient Details Name: Paul Flexer [name] MRN: [id number] DOB: 1945-02-14 Today's Date: 08/30/2022   History of Present Illness Paul Batis [name] is a 77 y.o. 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 Under tx for sepsis secondary to cellulitis/Induration of left groin, underwent incision and drainage of L groin abcess on 08/28/22.   Clinical Impression   Pt was seen for OT evaluation this date. Prior to hospital admission, pt was IND with ADLs and mobility, including working occasionally + driving. Pt lives in 1 level home with spouse, has 4WW and RW available. Pt presents to acute OT demonstrating impaired ADL performance and functional mobility 2/2 pain L groin and decreased activity tolerance (See OT problem list for additional functional deficits). Pt currently requires min guard assist using RW for mobility, minA-min guard for transfers and min-mod for LB ADLs with minA for UB ADLs.  During OT eval, pt limited by pain and fatigue as he just finished working with PT. Pt would benefit from skilled OT services to address noted impairments and functional limitations (see below for any additional details) in order to maximize safety and independence while minimizing falls risk and caregiver burden. Anticipate the need for follow up OT services upon acute hospital DC.  Pt left in bed with  spouse present, RN aware of pt's pain level and request for pain meds. All needs left within reach. OT will continue to follow acutely to maximize functional IND at d/c.     Recommendations for follow up therapy are one component of a multi-disciplinary discharge planning process, led by the attending physician.  Recommendations may be updated based on patient status, additional functional criteria and insurance authorization.   Assistance Recommended at Discharge Intermittent Supervision/Assistance  Patient can return home with the following A little help with walking and/or transfers;A little help with bathing/dressing/bathroom;Assistance with cooking/housework;Assist for transportation    Functional Status Assessment  Patient has had a recent decline in their functional status and demonstrates the ability to make significant improvements in function in a reasonable and predictable amount of time.  Equipment Recommendations  BSC/3in1    Recommendations for Other Services       Precautions / Restrictions Precautions Precautions: Fall;Other (comment) Precaution Comments: L groin abcess Restrictions Weight Bearing Restrictions: No      Mobility Bed Mobility Overal bed mobility: Needs Assistance Bed Mobility: Sit to Supine       Sit to supine: Min assist, HOB elevated (min A for mgmt of BLE)        Transfers Overall transfer level: Needs assistance   Transfers: Sit to/from Stand Sit to Stand: Min assist, From elevated surface           General transfer comment: Cues to control descent back into seated position, min guard to stand from EOB, minA to sit after mobility      Balance Overall balance assessment: Needs assistance Sitting-balance support: Feet supported, No upper extremity supported       Standing  balance support: During functional activity, Reliant on assistive device for balance, Bilateral upper extremity supported Standing balance-Leahy Scale: Fair                              ADL either performed or assessed with clinical judgement   ADL Overall ADL's : Needs assistance/impaired                         Toilet Transfer: Minimal assistance;Cueing for safety;Ambulation;Rolling walker (2 wheels) Toilet Transfer Details (indicate cue type and reason): Simulated, pt able to perform functional mobility t/f bathroom with min guard and RW, guarding of L groin incision site and slowed movements d/t pain         Functional mobility during ADLs: Min guard;Rolling walker (2 wheels) (RW to and from bathroom with min guard) General ADL Comments: Pt limited primarily by fatigue/pain, anticipate min-mod for LB ADLs and minA for UB ADLs      Pertinent Vitals/Pain Pain Assessment Pain Assessment: 0-10 Pain Score: 6  Pain Location: L groin Pain Descriptors / Indicators: Aching, Grimacing, Guarding Pain Intervention(s): Limited activity within patient's tolerance, Patient requesting pain meds-RN notified, Repositioned     Hand Dominance Right   Extremity/Trunk Assessment Upper Extremity Assessment Upper Extremity Assessment: Generalized weakness   Lower Extremity Assessment Lower Extremity Assessment: Generalized weakness       Communication Communication Communication: No difficulties   Cognition Arousal/Alertness: Awake/alert Behavior During Therapy: WFL for tasks assessed/performed Overall Cognitive Status: Within Functional Limits for tasks assessed                                       General Comments  BP 143/74 start of session, pt c/o mild dizziness            Home Living Family/patient expects to be discharged to:: Private residence Living Arrangements: Spouse/significant other Available Help at Discharge: Family Type of Home: House Home Access: Level entry     Home Layout: One level     Bathroom Shower/Tub: Chief Strategy Officer: Standard Bathroom  Accessibility: Yes   Home Equipment: Agricultural consultant (2 wheels);Rollator (4 wheels)          Prior Functioning/Environment Prior Level of Function : Independent/Modified Independent               ADLs Comments: MOD I - IND in ADLs, still working occ, driving        OT Problem List: Decreased strength;Decreased activity tolerance;Impaired balance (sitting and/or standing)      OT Treatment/Interventions: Self-care/ADL training;Therapeutic exercise;Therapeutic activities;Patient/family education;Balance training    OT Goals(Current goals can be found in the care plan section) Acute Rehab OT Goals OT Goal Formulation: With patient/family Time For Goal Achievement: 09/13/22 Potential to Achieve Goals: Good  OT Frequency: Min 1X/week       AM-PAC OT "6 Clicks" Daily Activity     Outcome Measure Help from another person eating meals?: None Help from another person taking care of personal grooming?: A Little Help from another person toileting, which includes using toliet, bedpan, or urinal?: A Lot Help from another person bathing (including washing, rinsing, drying)?: A Lot Help from another person to put on and taking off regular upper body clothing?: A Little Help from another person to put on and taking off regular lower body  clothing?: A Lot 6 Click Score: 16   End of Session Equipment Utilized During Treatment: Gait belt;Rolling walker (2 wheels) Nurse Communication: Mobility status;Patient requests pain meds  Activity Tolerance: Patient limited by fatigue;Patient limited by pain Patient left: in bed;with call bell/phone within reach;with bed alarm set;with family/visitor present  OT Visit Diagnosis: Muscle weakness (generalized) (M62.81);Unsteadiness on feet (R26.81)                Time: 1610-9604 OT Time Calculation (min): 16 min Charges:  OT General Charges $OT Visit: 1 Visit OT Evaluation $OT Eval Low Complexity: 1 Low OT Treatments $Self Care/Home  Management : 8-22 mins   L. , OTR/L  08/30/22, 1:09 PM

## 2022-08-30 NOTE — Evaluation (Signed)
Physical Therapy Evaluation Patient Details Name: Paul Southards [name] MRN: [id number] DOB: 1945/10/10 Today's Date: 08/30/2022  History of Present Illness  Pt is a 77 y.o. male presenting to hospital 08/26/22.  Pt fell backwards from standing hitting head on grass ground; no LOC; pt awoke this morning to unknown "knot" to L upper thigh into groin area.  Pt admitted with cellulitis of L groin, possible SIRS related to contusion, frequent falls.  S/p I&D of complex L thigh abscess and excisional debridement of skin subcutaneous tissue down to fascia and muscle measuring 15 square centimeters.  PMH includes NIDDM, htn, CKD, SFH, OSA, chronic pain, L frontal burr hole craniotomy, CLL, iron deficiency anemia, chronic LBP.  Clinical Impression  Prior to hospital admission, pt was independent with ambulation; lives with his wife in 1 level home (level entry).  Pain L groin 3/10 at rest but 5/10 with activity.  Currently pt is min assist with transfers (bed height elevated d/t L groin pain) and CGA to ambulate 40 feet with RW use (limited d/t L groin pain; pt reporting L hip feeling "tight").  Pt reporting intermittent dizziness during session (BP 143/79 in sitting; BP 167/83 in standing).  Pt would currently benefit from skilled PT to address noted impairments and functional limitations (see below for any additional details).  Upon hospital discharge, pt would benefit from ongoing therapy.    If plan is discharge home, recommend the following: A little help with walking and/or transfers;A little help with bathing/dressing/bathroom;Assistance with cooking/housework;Assist for transportation;Help with stairs or ramp for entrance   Can travel by private vehicle        Equipment Recommendations Rolling walker (2 wheels);BSC/3in1  Recommendations for Other Services  OT consult    Functional Status Assessment Patient has had a recent decline in their functional status and demonstrates the ability to make  significant improvements in function in a reasonable and predictable amount of time.     Precautions / Restrictions Precautions Precautions: Fall Precaution Comments: L groin incision Restrictions Weight Bearing Restrictions: No      Mobility  Bed Mobility Overal bed mobility: Needs Assistance Bed Mobility: Supine to Sit     Supine to sit: Min assist, HOB elevated     General bed mobility comments: assist for trunk; vc's for technique    Transfers Overall transfer level: Needs assistance   Transfers: Sit to/from Stand Sit to Stand: Min assist           General transfer comment: x2 trials standing (1st trial bed height moderately raised; 2nd trial bed height mildly raised); vc's for UE/LE placement; assist to initiate stand and control descent sitting    Ambulation/Gait Ambulation/Gait assistance: Min guard Gait Distance (Feet): 40 Feet Assistive device: Rolling walker (2 wheels) Gait Pattern/deviations: Decreased step length - right, Decreased step length - left Gait velocity: decreased     General Gait Details: antalgic; decreased stance time L LE; increased effort/time to take steps; partial step through gait pattern  Stairs            Wheelchair Mobility     Tilt Bed    Modified Rankin (Stroke Patients Only)       Balance Overall balance assessment: Needs assistance Sitting-balance support: Feet supported, No upper extremity supported Sitting balance-Leahy Scale: Good Sitting balance - Comments: steady reaching within BOS   Standing balance support: During functional activity, Reliant on assistive device for balance, Bilateral upper extremity supported Standing balance-Leahy Scale: Fair Standing balance comment: steady static standing  with B UE support on RW                             Pertinent Vitals/Pain Pain Assessment Pain Assessment: 0-10 Pain Score: 5  Pain Location: L groin Pain Descriptors / Indicators: Aching,  Grimacing, Guarding Pain Intervention(s): Limited activity within patient's tolerance, Monitored during session, Premedicated before session, Repositioned Vitals (HR and SpO2 on room air) stable and WFL throughout treatment session.    Home Living Family/patient expects to be discharged to:: Private residence Living Arrangements: Spouse/significant other Available Help at Discharge: Family;Available PRN/intermittently Type of Home: House Home Access: Level entry       Home Layout: One level Home Equipment: Rollator (4 wheels)      Prior Function Prior Level of Function : Independent/Modified Independent             Mobility Comments: No additional recent falls reported ADLs Comments: Modified independent/independent in ADL's; working; driving     Hand Dominance   Dominant Hand: Right    Extremity/Trunk Assessment   Upper Extremity Assessment Upper Extremity Assessment: Generalized weakness    Lower Extremity Assessment Lower Extremity Assessment: Generalized weakness    Cervical / Trunk Assessment Cervical / Trunk Assessment: Normal  Communication   Communication: No difficulties  Cognition Arousal/Alertness: Awake/alert Behavior During Therapy: WFL for tasks assessed/performed Overall Cognitive Status: Within Functional Limits for tasks assessed                                          General Comments Nursing cleared pt for participation in physical therapy.  Pt agreeable to PT session.  Pt's wife present during session.    Exercises  Transfer and gait training   Assessment/Plan    PT Assessment Patient needs continued PT services  PT Problem List Decreased strength;Decreased activity tolerance;Decreased balance;Decreased mobility;Decreased knowledge of use of DME;Decreased knowledge of precautions;Decreased skin integrity;Pain       PT Treatment Interventions DME instruction;Gait training;Functional mobility training;Therapeutic  activities;Therapeutic exercise;Balance training;Patient/family education    PT Goals (Current goals can be found in the Care Plan section)  Acute Rehab PT Goals Patient Stated Goal: to improve pain and mobility PT Goal Formulation: With patient Time For Goal Achievement: 09/13/22 Potential to Achieve Goals: Good    Frequency Min 1X/week     Co-evaluation               AM-PAC PT "6 Clicks" Mobility  Outcome Measure Help needed turning from your back to your side while in a flat bed without using bedrails?: None Help needed moving from lying on your back to sitting on the side of a flat bed without using bedrails?: A Little Help needed moving to and from a bed to a chair (including a wheelchair)?: A Little Help needed standing up from a chair using your arms (e.g., wheelchair or bedside chair)?: A Little Help needed to walk in hospital room?: A Little Help needed climbing 3-5 steps with a railing? : A Little 6 Click Score: 19    End of Session Equipment Utilized During Treatment: Gait belt Activity Tolerance: Patient limited by pain Patient left:  (sitting on EOB with OT present for OT evaluation) Nurse Communication: Mobility status;Precautions PT Visit Diagnosis: Other abnormalities of gait and mobility (R26.89);Muscle weakness (generalized) (M62.81);History of falling (Z91.81);Pain Pain - Right/Left: Left Pain -  part of body:  (groin)    Time: 0940-1006 PT Time Calculation (min) (ACUTE ONLY): 26 min   Charges:   PT Evaluation $PT Eval Low Complexity: 1 Low PT Treatments $Therapeutic Activity: 8-22 mins PT General Charges $$ ACUTE PT VISIT: 1 Visit        Hendricks Limes, PT 08/30/22, 2:46 PM

## 2022-08-30 NOTE — Plan of Care (Signed)
  Problem: Education: Goal: Knowledge of condition and prescribed therapy will improve 08/30/2022 0113 by Myles Gip, RN Outcome: Progressing 08/29/2022 2255 by Myles Gip, RN Outcome: Progressing   Problem: Clinical Measurements: Goal: Respiratory complications will improve Outcome: Progressing   Problem: Clinical Measurements: Goal: Cardiovascular complication will be avoided 08/30/2022 0113 by Myles Gip, RN Outcome: Progressing 08/29/2022 2255 by Myles Gip, RN Outcome: Progressing   Problem: Elimination: Goal: Will not experience complications related to urinary retention 08/30/2022 0113 by Myles Gip, RN Outcome: Progressing 08/29/2022 2255 by Myles Gip, RN Outcome: Progressing   Problem: Pain Managment: Goal: General experience of comfort will improve 08/30/2022 0113 by Myles Gip, RN Outcome: Progressing 08/29/2022 2255 by Myles Gip, RN Outcome: Progressing   Problem: Safety: Goal: Ability to remain free from injury will improve 08/30/2022 0113 by Myles Gip, RN Outcome: Progressing 08/29/2022 2255 by Myles Gip, RN Outcome: Progressing

## 2022-08-31 DIAGNOSIS — L03314 Cellulitis of groin: Secondary | ICD-10-CM | POA: Diagnosis not present

## 2022-08-31 LAB — GLUCOSE, CAPILLARY
Glucose-Capillary: 101 mg/dL — ABNORMAL HIGH (ref 70–99)
Glucose-Capillary: 113 mg/dL — ABNORMAL HIGH (ref 70–99)
Glucose-Capillary: 149 mg/dL — ABNORMAL HIGH (ref 70–99)
Glucose-Capillary: 151 mg/dL — ABNORMAL HIGH (ref 70–99)
Glucose-Capillary: 94 mg/dL (ref 70–99)

## 2022-08-31 MED ORDER — PIPERACILLIN-TAZOBACTAM 3.375 G IVPB 30 MIN: 3.3750 g | Freq: Once | INTRAVENOUS | Status: DC

## 2022-08-31 MED ORDER — MORPHINE SULFATE (PF) 2 MG/ML IV SOLN
2.0000 mg | INTRAVENOUS | Status: DC | PRN
  Administered 2022-08-31: 2 mg via INTRAVENOUS
  Filled 2022-08-31: qty 1

## 2022-08-31 MED ORDER — ORAL CARE MOUTH RINSE: 15.0000 mL | OROMUCOSAL | Status: DC | PRN

## 2022-08-31 MED ORDER — VANCOMYCIN HCL IN DEXTROSE 1-5 GM/200ML-% IV SOLN: 1000.0000 mg | Freq: Once | INTRAVENOUS | Status: DC

## 2022-08-31 MED ORDER — CLINDAMYCIN PHOSPHATE 600 MG/50ML IV SOLN: 600.0000 mg | Freq: Three times a day (TID) | INTRAVENOUS | Status: DC

## 2022-08-31 NOTE — Plan of Care (Signed)
  Problem: Pain Managment: Goal: General experience of comfort will improve Outcome: Progressing   Problem: Elimination: Goal: Will not experience complications related to bowel motility Outcome: Progressing Goal: Will not experience complications related to urinary retention Outcome: Progressing   Problem: Clinical Measurements: Goal: Ability to maintain clinical measurements within normal limits will improve Outcome: Progressing Goal: Will remain free from infection Outcome: Progressing Goal: Diagnostic test results will improve Outcome: Progressing Goal: Respiratory complications will improve Outcome: Progressing Goal: Cardiovascular complication will be avoided Outcome: Progressing   Problem: Education: Goal: Knowledge of condition and prescribed therapy will improve Outcome: Progressing   Problem: Cardiac: Goal: Will achieve and/or maintain adequate cardiac output Outcome: Progressing   Problem: Coping: Goal: Ability to adjust to condition or change in health will improve Outcome: Progressing

## 2022-08-31 NOTE — Care Management Important Message (Signed)
Important Message  Patient Details  Name: Paul Curtsinger [name] MRN: [id number] Date of Birth: 01/22/1946   Medicare Important Message Given:  N/A - LOS <3 / Initial given by admissions     [name] 08/31/2022, 10:54 AM

## 2022-08-31 NOTE — Progress Notes (Signed)
Occupational Therapy Treatment Patient Details Name: Paul Kronick [name] MRN: [id number] DOB: 01-11-1946 Today's Date: 08/31/2022   History of present illness Pt is a 77 y.o. male presenting to hospital 08/26/22.  Pt fell backwards from standing hitting head on grass ground; no LOC; pt awoke this morning to unknown "knot" to L upper thigh into groin area.  Pt admitted with cellulitis of L groin, possible SIRS related to contusion, frequent falls.  S/p I&D of complex L thigh abscess and excisional debridement of skin subcutaneous tissue down to fascia and muscle measuring 15 square centimeters.  PMH includes NIDDM, htn, CKD, SFH, OSA, chronic pain, L frontal burr hole craniotomy, CLL, iron deficiency anemia, chronic LBP.   OT comments  Pt seen for OT tx. Pt declined EOB/OOB ADL/mobility reporting that he had just walked to the bathroom with the RW by himself and completed toileting without assist required. Family present verifying pt's account. Facilitated problem solving with education in ECS, home/routines modifications, and AE/DME for LB ADL tasks to improve safety and independence while minimizing groin pain with bending. Pt/family verbalized understanding. Pt continues to benefit from skilled OT services.    Recommendations for follow up therapy are one component of a multi-disciplinary discharge planning process, led by the attending physician.  Recommendations may be updated based on patient status, additional functional criteria and insurance authorization.    Assistance Recommended at Discharge Intermittent Supervision/Assistance  Patient can return home with the following  A little help with walking and/or transfers;A little help with bathing/dressing/bathroom;Assistance with cooking/housework;Assist for transportation   Equipment Recommendations  BSC/3in1    Recommendations for Other Services      Precautions / Restrictions Precautions Precautions: Fall Precaution Comments: L  groin incision Restrictions Weight Bearing Restrictions: No       Mobility Bed Mobility               General bed mobility comments: declined    Transfers                         Balance                                           ADL either performed or assessed with clinical judgement   ADL                                         General ADL Comments: Pt declined ADL participation, stating he just completed toileting independently just prior to OT's arrival    Extremity/Trunk Assessment              Vision       Perception     Praxis      Cognition Arousal/Alertness: Awake/alert Behavior During Therapy: WFL for tasks assessed/performed Overall Cognitive Status: Within Functional Limits for tasks assessed                                          Exercises Other Exercises Other Exercises: Facilitated problem solving with education in home/routines modifications and AE/DME for LB ADL tasks to improve safety and independence while minimizing groin pain with bending.    Shoulder Instructions  General Comments      Pertinent Vitals/ Pain       Pain Assessment Pain Assessment: No/denies pain Pain Intervention(s): Premedicated before session  Home Living                                          Prior Functioning/Environment              Frequency  Min 1X/week        Progress Toward Goals  OT Goals(current goals can now be found in the care plan section)  Progress towards OT goals: Progressing toward goals  Acute Rehab OT Goals OT Goal Formulation: With patient/family Time For Goal Achievement: 09/13/22 Potential to Achieve Goals: Good  Plan Discharge plan remains appropriate;Frequency remains appropriate    Co-evaluation                 AM-PAC OT "6 Clicks" Daily Activity     Outcome Measure   Help from another person eating meals?:  None Help from another person taking care of personal grooming?: None Help from another person toileting, which includes using toliet, bedpan, or urinal?: A Little Help from another person bathing (including washing, rinsing, drying)?: A Lot Help from another person to put on and taking off regular upper body clothing?: A Little Help from another person to put on and taking off regular lower body clothing?: A Lot 6 Click Score: 18    End of Session    OT Visit Diagnosis: Muscle weakness (generalized) (M62.81);Unsteadiness on feet (R26.81)   Activity Tolerance Patient limited by fatigue   Patient Left in bed;with call bell/phone within reach;with family/visitor present   Nurse Communication          Time: 4540-9811 OT Time Calculation (min): 10 min  Charges: OT General Charges $OT Visit: 1 Visit OT Treatments $Self Care/Home Management : 8-22 mins  Arman Filter., MPH, MS, OTR/L ascom [phone] 08/31/22, 1:14 PM

## 2022-08-31 NOTE — Progress Notes (Signed)
[facility] SURGICAL PROGRESS NOTE  Hospital Day(s): 3.   Post op day(s): 3 Days Post-Op.   Interval History:  Patient seen and examined No acute events or new complaints overnight.  Patient reports he is still having pain with dressing changes; pain medications helping No fever, chills  Leukocytosis remains resolved; 8.2K Hgb to 7.4; stable Cx with MRSA Now on Linezolid   Vital signs in last 24 hours: [min-max] current  Temp:  [97.9 F (36.6 C)-99.1 F (37.3 C)] 98.1 F (36.7 C) (08/06 0419) Pulse Rate:  [54-68] 55 (08/06 0419) Resp:  [16-20] 19 (08/06 0419) BP: (135-154)/(60-79) 145/66 (08/06 0419) SpO2:  [97 %-99 %] 99 % (08/06 0419) Weight:  [107.4 kg] 107.4 kg (08/06 0500)     Height: 6\' 1"  (185.4 cm) Weight: 107.4 kg BMI (Calculated): 31.24   Intake/Output last 2 shifts:  08/05 0701 - 08/06 0700 In: 843 [P.O.:240; I.V.:3; IV Piggyback:600] Out: 400 [Urine:400]   Physical Exam:  Constitutional: alert, cooperative and no distress  Respiratory: breathing non-labored at rest  Cardiovascular: regular rate and sinus rhythm  Integumentary: I&D site to left groin, this is clean, erythema improved, drainage on dressing serous  Left Groin Wound (08/31/2022)   Labs:     Latest Ref Rng & Units 08/31/2022    6:03 AM 08/30/2022    5:51 AM 08/29/2022    4:43 AM  CBC  WBC 4.0 - 10.5 K/uL 8.2  8.0  8.7   Hemoglobin 13.0 - 17.0 g/dL 7.4  7.4  7.5   Hematocrit 39.0 - 52.0 % 23.8  23.6  24.7   Platelets 150 - 400 K/uL 230  194  185       Latest Ref Rng & Units 08/31/2022    6:03 AM 08/30/2022    5:51 AM 08/29/2022    4:43 AM  CMP  Glucose 70 - 99 mg/dL 98  540  981   BUN 8 - 23 mg/dL 13  14  18    Creatinine 0.61 - 1.24 mg/dL 1.91  4.78  2.95   Sodium 135 - 145 mmol/L 141  140  138   Potassium 3.5 - 5.1 mmol/L 3.5  3.9  3.3   Chloride 98 - 111 mmol/L 113  113  109   CO2 22 - 32 mmol/L 22  20  22    Calcium 8.9 - 10.3 mg/dL 7.7  7.8  7.9      Imaging  studies: No new pertinent imaging studies   Assessment/Plan: 77 y.o. male 3 Days Post-Op s/p I&D of complex left thigh abscess   - Continue pain control prn  - Continue local dressing changes; Pack with 1 inch packing strips or saline moistened gauze; cover, secure. Change daily and as needed  - Continue IV Abx (Linezolid); Cx with MRSA  - Okay to mobilize - Further management per primary service; we will follow     - Discharge Planning: Clinically doing well, Cx with MRSA now on Linezolid. Recommend at least another 24 hours for IV Abx and pain control as well as dressing change teaching.  All of the above findings and recommendations were discussed with the patient, patient's family at bedside, and the medical team, and all of patient's and family's questions were answered to their expressed satisfaction.  -- Lynden Oxford, PA-C [facility] 08/31/2022, 7:23 AM M-F: 7am - 4pm

## 2022-08-31 NOTE — Progress Notes (Signed)
PT Cancellation Note  Patient Details Name: Paul Hamdan [name] MRN: [id number] DOB: Feb 24, 1945   Cancelled Treatment:    Reason Eval/Treat Not Completed: Other (comment).  Chart reviewed and attempted to see pt.  Pt reports he has been getting up and walking to the bathroom with the walker by himself and is close to his baseline levels.  Pt denies the need for therapy services at this point in hospital stay.  Patient is at baseline, all education completed, and time is given to address all questions/concerns. No additional skilled PT services needed at this time, PT signing off. PT recommends daily ambulation ad lib or with nursing staff as needed to prevent deconditioning.   Nolon Bussing, PT, DPT Physical Therapist - [facility]  08/31/22, 3:23 PM

## 2022-08-31 NOTE — Progress Notes (Signed)
Progress Note   Patient: Paul Hogenson [name] VHQ:[id number] DOB: November 16, 1945 DOA: 08/26/2022     3 DOS: the patient was seen and examined on 08/31/2022     Subjective:  Patient seen in the presence of the wife this morning He tells me he is improving Surgical team recommending 1 more day of IV antibiotics and dressing changes before discharge He denies nausea vomiting abdominal pain chest pain or cough   Brief hospital course: 77 y.o. 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 He presents with left groin pain. He apparently had falls at home with no loss of consciousness. He complains of a painful knot felt to the left upper thigh and groin area. He had low grade fever during this course of stay with Tmax of 100.52F     Assessment and Plan: Sepsis secondary to cellulitis/Induration of left groin-present on admission Patient presented with a fever of 100.9 in the setting of leukocytosis 15,000 meeting sepsis criteria I have reviewed the CT scan results showing findings of left lower abdominal wall fat stranding into the anterior left thigh WBC 15,000, up from 8000 on discharge a couple days prior, lactic acid 1.2 Suspected SIRS. Procalcitonin was 0.15 Patient underwent incision and drainage done by general surgeon on 08/28/2022 Surgeon on board and case discussed Continue current pain management Continue IV antibiotics Culture results growing MRSA, if no additional organism detected on culture results by tomorrow patient could be discharged on either Zyvox or doxycycline based on sensitivity results Continue wound care closely   Elevated troponin:   Known chronic LBBB,CAD Patient denies any chest  pains Continue monitoring Likely troponin elevation is due to demand ischemia Patient was seen by cardiology and cleared from surgical standpoint   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 CAD on coronary CTA.Continue on continue Toprol Denies any chest pains Cardiology on board and currently not planning any inpatient cardiac workup and recommends outpatient follow-up at discharge Telemetry discontinued   Frequent fall/ presyncope: Consider revising opiates medications Patient had recent echo in July 2024 to evaluate her new LBBB and showed cardiomyopathy with EF 45 to 50% and G1 DD Monitor orthostatics closely Continue telemetry monitoring PT OT on board Patient able to walk around with minimal assistance   Essential hypertension: BP not at goal.Continue metoprolol and valsartan and hydralazine prn   Type II diabetes mellitus (HCC): Continue Farxiga:  Continue to monitor fingerstick gluocse  Continue current insulin therapy   Hypothyroidism: Continue levothyroxine   IDA (iron deficiency anemia): Hemoglobin at baseline at 8.1.Daily H/h monitoring.  Recent history of GI blood loss. Transfuse if HB< 7   CLL (chronic lymphocytic leukemia) (HCC):  Followed by oncology. On Brukinsa 80 mg twice daily   Chronic radicular lumbar pain: Chronic continuous use of opiates. Revised dosing as appropriate.continue oxycodone, Lyrica   OSA on CPAP: Continue CPAP at night       Physical Exam: Elderly male seen in no acute distress HEENT:Head atraumatic. Neck supple. No JVD CHEST: Clinically cleat Abdomen: Left groin dressing is clean and dry CVS: S1,S2 wnm or gallops CNS: No focal deficits Skin: Negative for rash     Disposition: Home health  Data Reviewed: I have reviewed patient's labs including CBC, BMP, nursing documentation, transition of care manager  documentation as well as surgical documentation.   Vitals:   08/31/22 0419  08/31/22 0500 08/31/22 0746 08/31/22 1320  BP: (!) 145/66  (!) 154/66 (!) 146/67  Pulse: (!) 55  (!) 54 (!) 58  Resp: 19  18 20   Temp: 98.1 F (36.7 C)  98 F (36.7 C) 98.1 F (36.7 C)  TempSrc:      SpO2: 99%  99% 97%  Weight:  107.4 kg    Height:         Author: Loyce Dys, MD 08/31/2022 4:03 PM  For on call review [email or web address].

## 2022-09-01 ENCOUNTER — Other Ambulatory Visit: Payer: Self-pay

## 2022-09-01 DIAGNOSIS — L03314 Cellulitis of groin: Secondary | ICD-10-CM | POA: Diagnosis not present

## 2022-09-01 LAB — GLUCOSE, CAPILLARY
Glucose-Capillary: 133 mg/dL — ABNORMAL HIGH (ref 70–99)
Glucose-Capillary: 102 mg/dL — ABNORMAL HIGH (ref 70–99)
Glucose-Capillary: 110 mg/dL — ABNORMAL HIGH (ref 70–99)

## 2022-09-01 MED ORDER — LINEZOLID 600 MG PO TABS
600.0000 mg | ORAL_TABLET | Freq: Two times a day (BID) | ORAL | 0 refills | Status: AC
  Filled 2022-09-01 (×2): qty 14, 7d supply, fill #0

## 2022-09-01 NOTE — TOC Transition Note (Addendum)
Transition of Care [facility]) - CM/SW Discharge Note   Patient Details  Name: Paul Gaynor [name] MRN: [id number] Date of Birth: 1945/09/27  Transition of [facility]) CM/SW Contact:  Chapman Fitch, RN Phone Number: 09/01/2022, 3:34 PM   Clinical Narrative:     Patient to discharge today Met with patient at bedside Patient agreeable to home health services.  States he doesn't have a preference of agency.  Patient in agreement to [facility] and RW  Referral accepted by [facility] with [facility].  Prior auth form completed and faxed to [employer] at [phone]  Patient states that daughter to transport and patient in agreement for me to call daughter and update  Per daughter she has concerns about patient changing his own dressings.She is willing to learn and complete dressings  Daughter and patient both aware that home health will not be coming out everyday, and that they are unable to come out until they obtain insurance auth.  Per [employer] patient already received walker and  BSC in 2023.  Daughter and patient confirms he has the DME at home  Update:  Daughter came to room for wound care education. Patient declines for her to assist in changing dressings  Patient states "I'm of sound mind, and a grown man.  Ill do it myself.  I'm going to tell you once and only once she isn't helping me with it, and y'all can't send me to jail for not letting her help.   MD and surgery updated  Patient states that he as a friend Paul Briggs in the room that will transport home Bedside RN to educate patient with wound care and provide supplies at discharge  PCP appointment scheduled for 8/13.  Added to AVS   Final next level of care: Home w Home Health Services Barriers to Discharge: Barriers Resolved   Patient Goals and CMS Choice CMS Medicare.gov Compare Post Acute Care list provided to:: Patient Choice offered to / list presented to : Patient  Discharge Placement                          Discharge Plan and Services Additional resources added to the After Visit Summary for                            [facility] Arranged: OT, PT, RN [facility] Agency: [facility] Date [facility] Agency Contacted: 09/01/22   Representative spoke with at [facility] Agency: Barbara Cower  Social Determinants of Health (SDOH) Interventions SDOH Screenings   Food Insecurity: No Food Insecurity (08/26/2022)  Recent Concern: Food Insecurity - Food Insecurity Present (07/26/2022)  Housing: Low Risk  (08/26/2022)  Transportation Needs: No Transportation Needs (08/26/2022)  Utilities: Not At Risk (08/26/2022)  Alcohol Screen: Low Risk  (01/22/2022)  Depression (PHQ2-9): Low Risk  (08/19/2022)  Financial Resource Strain: Medium Risk (07/26/2022)  Physical Activity: Unknown (07/26/2022)  Social Connections: Moderately Integrated (07/26/2022)  Stress: Stress Concern Present (07/26/2022)  Tobacco Use: Medium Risk (08/26/2022)     Readmission Risk Interventions    08/29/2022   10:57 AM  Readmission Risk Prevention Plan  Transportation Screening Complete  Medication Review (RN Care Manager) Complete  PCP or Specialist appointment within 3-5 days of discharge Complete  HRI or Home Care Consult Complete  SW Recovery Care/Counseling Consult Complete  Palliative Care Screening Not Applicable  Skilled Nursing Facility Complete

## 2022-09-01 NOTE — Progress Notes (Signed)
[facility] SURGICAL PROGRESS NOTE  Hospital Day(s): 4.   Post op day(s): 4 Days Post-Op.   Interval History:  Patient seen and examined No acute events or new complaints overnight.  Patient reports he is feeling better; pain improving; still exacerbated with dressing changes of course  No fever, chills  Leukocytosis remains resolved; 8.4K Hgb to 7.1; relatively stable Renal function normal; sCr - 1.09; UO - 325 ccs + unmeasured  Mild hypokalemia to 3.4  Cx with MRSA Now on Linezolid   Vital signs in last 24 hours: [min-max] current  Temp:  [98 F (36.7 C)-98.8 F (37.1 C)] 98.2 F (36.8 C) (08/07 0354) Pulse Rate:  [54-65] 55 (08/07 0354) Resp:  [16-20] 16 (08/06 1938) BP: (146-163)/(66-71) 163/71 (08/06 1938) SpO2:  [97 %-99 %] 99 % (08/07 0354) Weight:  [107.4 kg] 107.4 kg (08/07 0500)     Height: 6\' 1"  (185.4 cm) Weight: 107.4 kg BMI (Calculated): 31.25   Intake/Output last 2 shifts:  08/06 0701 - 08/07 0700 In: 1020 [P.O.:1020] Out: 325 [Urine:325]   Physical Exam:  Constitutional: alert, cooperative and no distress  Respiratory: breathing non-labored at rest  Cardiovascular: regular rate and sinus rhythm  Integumentary: I&D site to left groin wound is clean, very minimal erythema to superior edge, not worse compared to previous examination, drainage on dressing is serous, no evidence of undrained collection nor necrotizing infection   Labs:     Latest Ref Rng & Units 09/01/2022    4:25 AM 08/31/2022    6:03 AM 08/30/2022    5:51 AM  CBC  WBC 4.0 - 10.5 K/uL 8.4  8.2  8.0   Hemoglobin 13.0 - 17.0 g/dL 7.1  7.4  7.4   Hematocrit 39.0 - 52.0 % 23.7  23.8  23.6   Platelets 150 - 400 K/uL 266  230  194       Latest Ref Rng & Units 09/01/2022    4:25 AM 08/31/2022    6:03 AM 08/30/2022    5:51 AM  CMP  Glucose 70 - 99 mg/dL 671  98  245   BUN 8 - 23 mg/dL 11  13  14    Creatinine 0.61 - 1.24 mg/dL 8.09  9.83  3.82   Sodium 135 - 145 mmol/L 142  141   140   Potassium 3.5 - 5.1 mmol/L 3.4  3.5  3.9   Chloride 98 - 111 mmol/L 113  113  113   CO2 22 - 32 mmol/L 22  22  20    Calcium 8.9 - 10.3 mg/dL 7.8  7.7  7.8      Imaging studies: No new pertinent imaging studies   Assessment/Plan: 77 y.o. male 4 Days Post-Op s/p I&D of complex left thigh abscess   - Continue pain control prn  - Continue local dressing changes; Pack with 1 inch packing strips or saline moistened gauze; cover, secure. Change daily and as needed  - Continue IV Abx (Linezolid); Cx with MRSA  - Okay to mobilize - Further management per primary service   - Discharge Planning: Clinically doing well, Cx with MRSA now on Linezolid. Okay for discharge from surgical standpoint, I will be happy to see him in 1-2 weeks for wound check. We will follow peripherally; please call with questions/concerns  All of the above findings and recommendations were discussed with the patient, patient's family at bedside, and the medical team, and all of patient's and family's questions were answered to their expressed satisfaction.  --  Lynden Oxford, PA-C [facility] 09/01/2022, 7:35 AM M-F: 7am - 4pm

## 2022-09-01 NOTE — Discharge Summary (Signed)
Physician Discharge Summary   Patient: Paul Bloodsaw [name] MRN: [id number] DOB: 1945-09-01  Admit date:     08/26/2022  Discharge date: 09/01/22  Discharge Physician: Pennie Banter   PCP: Wilford Corner, PA-C   Recommendations at discharge:    Follow up with General Surgery in clinic in 1-2 weeks for wound check Continue daily dressing changes:  pack wound with 1-inch packing strips or saline-moistened gauze; cover and secure. Change daily. Follow up with home health for PT, OT and nursing support for wound care and close monitoring Follow up with Primary Care in 1-2 weeks Repeat CBC, BMP in 1-2 weeks Follow up as scheduled with Hematology/Oncology  Discharge Diagnoses: Principal Problem:   Cellulitis of left groin Active Problems:   Contusion of groin, left, initial encounter   Frequent falls   Elevated troponin   Leukocytosis   Essential hypertension   Type II diabetes mellitus (HCC)   Hypothyroidism   OSA on CPAP   Chronic radicular lumbar pain   CLL (chronic lymphocytic leukemia) (HCC)   IDA (iron deficiency anemia)   Chronic, continuous use of opioids   Cardiomyopathy (HCC)   Fall   Abscess of left groin   Abscess of muscle of thigh  Resolved Problems:   * No resolved hospital problems. *  Hospital Course: "77 y.o. 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 He presents with left groin pain. He apparently had falls at home with no loss of consciousness. He complains of a painful knot felt to the left upper thigh and groin area. He had low grade fever during this course of stay with Tmax of 100.53F "  Further hospital course and management as outlined below.  09/01/22: pt  clinically stable, afebrile.  Pt and daughter were taught how to do daily dressing changes to include wound packing at home.  TOC arranging home health services, PT, OT and RN support once insurance authorization is obtained will begin.  Seen by surgery and cleared from their standpoint for discharge.  No other acute medical needs at this time.   Pt is stable and agreeable to discharge this afternoon.  Assessment and Plan:  Sepsis secondary to cellulitis/Induration of left groin-present on admission Patient presented with a fever of 100.9 in the setting of leukocytosis 15,000 meeting sepsis criteria CT scan with findings of left lower abdominal wall fat stranding into the anterior left thigh WBC 15,000, up from 8000 on discharge a couple days prior, lactic acid 1.2. Status post I&D by general surgeon on 08/28/2022 Surgeon on board and case discussed Continue current pain management Treated with empiric IV antibiotics - Initially Vanc/Cefepime >> Vanc/Zosyn >> Zyvox and Unasyn Cultures isolated MRSA, no additional organism detected. Discharge on 7 more days Zyvox  Continue wound care daily with packing HH RN ordered to assist. Follow up in surgery clinic next week with Dr. 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 CAD on coronary CTA. Stable - no chest pain reported --Continue Toprol --Cardiology consulted -- no inpatient cardiac workup, outpatient follow-up at discharge   Frequent fall/ presyncope: Falls  possibly due to Neuropathy Recent echo in July 2024 to evaluate new LBBB and showed cardiomyopathy with EF 45 to 50% and G1 DD Monitor orthostatics  Telemetry monitoring PT OT -- home health PT/OT recommended, will be arranged pending insurance  authorization per [facility] Patient able to walk around with minimal assistance PCP follow up if ongoing   Essential hypertension: BP not at goal. --Continue metoprolol and valsartan and hydralazine prn   Type II diabetes mellitus (HCC): Continue [place]. Metformin held - resume at d/c. Also covered with insulin during admission.   Hypothyroidism: Continue levothyroxine   IDA (iron deficiency anemia): Hemoglobin at baseline at 8.1.Daily H/h monitoring.  Recent history of GI blood loss.  --Repeat CBC in 1-2 weeks at follow up Hbg stable in low 7's with no signs of bleeding noted   CLL (chronic lymphocytic leukemia) (HCC):  Followed by oncology. On Brukinsa 80 mg twice daily   Chronic radicular lumbar pain: Chronic continuous use of opiates. Revised dosing as appropriate for acute pain. --Continue oxycodone, Lyrica   OSA on CPAP: CPAP during sleep         Consultants: General surgery Procedures performed: incision and drainage  Disposition: Home health Diet recommendation:  Cardiac and Carb modified diet DISCHARGE MEDICATION: Allergies as of 09/01/2022   No Known Allergies      Medication List     STOP taking these medications    trospium 20 MG tablet Commonly known as: SANCTURA       TAKE these medications    aspirin EC 81 MG tablet Take 1 tablet (81 mg total) by mouth daily. Swallow whole.   Brukinsa 80 MG capsule Generic drug: zanubrutinib Take 2 capsules (160 mg total) by mouth 2 (two) times daily.   dapagliflozin propanediol 10 MG Tabs tablet Commonly known as: Farxiga Take 1 tablet (10 mg total) by mouth daily before breakfast.   hydrALAZINE 50 MG tablet Commonly known as: APRESOLINE Take 1 tablet (50 mg total) by mouth in the morning and at bedtime.   levothyroxine 200 MCG tablet Commonly known as: SYNTHROID Take 200 mcg by mouth daily.   linezolid 600 MG tablet Commonly known as: ZYVOX Take 1 tablet (600 mg total) by mouth 2 (two) times  daily for 7 days.   metFORMIN 500 MG tablet Commonly known as: GLUCOPHAGE Take 500 mg by mouth 2 (two) times daily with a meal.   metoprolol succinate 25 MG 24 hr tablet Commonly known as: Toprol XL Take 1 tablet (25 mg total) by mouth daily.   oxyCODONE-acetaminophen 10-325 MG tablet Commonly known as: PERCOCET Take 1 tablet by mouth every 8 (eight) hours as needed.   oxyCODONE-acetaminophen 10-325 MG tablet Commonly known as: PERCOCET Take 1 tablet by mouth every 8 (eight) hours as needed. Start taking on: September 22, 2022   oxyCODONE-acetaminophen 10-325 MG tablet Commonly known as: PERCOCET Take 1 tablet by mouth every 8 (eight) hours as needed. Start taking on: October 22, 2022   pregabalin 100 MG capsule Commonly known as: Lyrica Take 1 capsule (100 mg total) by mouth 2 (two) times daily.   rosuvastatin 40 MG tablet Commonly known as: CRESTOR Take 1 tablet (40 mg total) by mouth daily.   valsartan 160 MG tablet Commonly known as: DIOVAN Take 160 mg by mouth daily.               Durable Medical Equipment  (From admission, onward)           Start  Ordered   09/01/22 1217  For home use only DME Bedside commode  Once       Question:  Patient needs a bedside commode to treat with the following condition  Answer:  Weakness   09/01/22 1216   09/01/22 1216  For home use only DME Walker rolling  Once       Question Answer Comment  Walker: With 5 Inch Wheels   Patient needs a walker to treat with the following condition Weakness      09/01/22 1216              Discharge Care Instructions  (From admission, onward)           Start     Ordered   09/01/22 0000  Discharge wound care:       Comments: Continue local dressing changes; Pack with 1 inch packing strips or saline moistened gauze; cover, secure. Change daily and as needed   09/01/22 1236            Follow-up Information     Donovan Kail, PA-C. Schedule an appointment as  soon as possible for a visit in 2 week(s).   Specialty: Physician Assistant Why: s/p left groin I&D Contact information: [address] [place] [address] [phone]                Discharge Exam: Ceasar Mons Weights   08/30/22 0500 08/31/22 0500 09/01/22 0500  Weight: 105.7 kg 107.4 kg 107.4 kg   General exam: awake, alert, no acute distress HEENT: moist mucus membranes, hearing grossly normal  Respiratory system: on room air, normal respiratory effort. Cardiovascular system: normal S1/S2, RRR, no pedal edema.   Gastrointestinal system: soft, NT, ND Central nervous system: A&O x3. no gross focal neurologic deficits, normal speech Extremities: left groin dressing clean dry intact Skin: dry, intact, normal temperature Psychiatry: normal mood, congruent affect, judgement and insight appear normal   Condition at discharge: stable  The results of significant diagnostics from this hospitalization (including imaging, microbiology, ancillary and laboratory) are listed below for reference.   Imaging Studies: DG Chest Port 1 View  Result Date: 08/26/2022 CLINICAL DATA:  Frequent falls EXAM: PORTABLE CHEST 1 VIEW COMPARISON:  Chest x-ray 10/10/2014 FINDINGS: The heart is enlarged, unchanged. The lungs are clear. There is no pleural effusion or pneumothorax. No acute fractures are seen. IMPRESSION: 1. No active disease. 2. Cardiomegaly. Electronically Signed   By: Darliss Cheney M.D.   On: 08/26/2022 22:45   CT ABDOMEN PELVIS W CONTRAST  Result Date: 08/26/2022 CLINICAL DATA:  Loss of balance, fell, abdominal discomfort and swelling, history of CLL EXAM: CT ABDOMEN AND PELVIS WITH CONTRAST TECHNIQUE: Multidetector CT imaging of the abdomen and pelvis was performed using the standard protocol following bolus administration of intravenous contrast. RADIATION DOSE REDUCTION: This exam was performed according to the departmental dose-optimization program which includes automated  exposure control, adjustment of the mA and/or kV according to patient size and/or use of iterative reconstruction technique. CONTRAST:  OMNIPAQUE IOHEXOL 300 MG/ML  SOLN COMPARISON:  01/04/2022 FINDINGS: Lower chest: Cardiomegaly without pericardial effusion. No acute pleural or parenchymal lung disease. Hepatobiliary: No hepatic injury or perihepatic hematoma. Gallbladder is unremarkable. Pancreas: Unremarkable. No pancreatic ductal dilatation or surrounding inflammatory changes. Spleen: 1.9 cm hypodensity within the medial aspect of the spleen image 17/2 is nonspecific. The remainder of the spleen is unremarkable. No evidence of splenomegaly. Adrenals/Urinary Tract: Excreted contrast within the kidneys from CT coronary angiogram performed  earlier today. Multiple bilateral renal cortical cysts do not require specific imaging follow-up. No obstructive uropathy within either kidney. No filling defects within the bladder. 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 Stomach/Bowel: No bowel obstruction or ileus. Normal appendix right lower quadrant. Scattered distal colonic diverticulosis without diverticulitis. No bowel wall thickening or inflammatory change. Vascular/Lymphatic: Stable aortic atherosclerosis. Continued retroperitoneal and pelvic adenopathy. Index lymph nodes are as follows: Left external iliac, image 76/2, 2.9 cm in short axis. Previously 2.6 cm. Right external iliac, image 73/2, 1.0 cm in short axis. Previously 1.7 cm. Right external iliac, image 67/2, 1.5 cm in short axis. Previously 2.1 cm. No new adenopathy. Reproductive: Prostate is unremarkable. Other: No free fluid or free intraperitoneal gas. No abdominal wall hernia. Musculoskeletal: There is subcutaneous fat stranding within the anterior aspect of the proximal left thigh, extending into the left lower quadrant anterior abdominal wall, which may reflect contusion after trauma. There is no fluid collection or  hematoma. There are no acute displaced fractures. Reconstructed images demonstrate no additional findings. IMPRESSION: 1. Subcutaneous fat stranding within the left lower quadrant anterior abdominal wall extending into the anterior proximal left thigh, likely related to contusion given history of recent trauma. No fluid collection or hematoma. 2. No acute displaced fracture. 3. Retroperitoneal and pelvic lymphadenopathy as above, with waxing and waning appearance. 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 4. 1.9 cm hypodensity within the medial aspect of the spleen, nonspecific. Leukemic involvement of the spleen cannot be excluded. 5. Distal colonic diverticulosis without diverticulitis. 6.  Aortic Atherosclerosis (ICD10-I70.0). Electronically Signed   By: Sharlet Salina M.D.   On: 08/26/2022 19:54   CT Head Wo Contrast  Result Date: 08/26/2022 CLINICAL DATA:  Fall EXAM: CT HEAD WITHOUT CONTRAST CT CERVICAL SPINE WITHOUT CONTRAST TECHNIQUE: Multidetector CT imaging of the head and cervical spine was performed following the standard protocol without intravenous contrast. Multiplanar CT image reconstructions of the cervical spine were also generated. RADIATION DOSE REDUCTION: This exam was performed according to the departmental dose-optimization program which includes automated exposure control, adjustment of the mA and/or kV according to patient size and/or use of iterative reconstruction technique. COMPARISON:  None Available. FINDINGS: CT HEAD FINDINGS Brain: No evidence of acute infarction, hemorrhage, hydrocephalus, extra-axial collection or mass lesion/mass effect. Vascular: No hyperdense vessel or unexpected calcification. Skull: Status post left frontal craniotomy negative for fracture or focal lesion. Sinuses/Orbits: Mucosal thickening and small air-fluid level of the right maxillary sinus (series 2, image 4) Other: None. CT CERVICAL SPINE FINDINGS Alignment: Degenerative straightening and  reversal of the normal cervical lordosis Skull base and vertebrae: No acute fracture. No primary bone lesion or focal pathologic process. Soft tissues and spinal canal: No prevertebral fluid or swelling. No visible canal hematoma. Disc levels: Moderate to severe multilevel disc space height loss and osteophytosis throughout the cervical spine. Upper chest: Negative. Other: None. IMPRESSION: 1. No acute intracranial pathology. 2. Status post left frontal burr hole craniotomy. 3. Mucosal thickening and small air-fluid level of the right maxillary sinus. Correlate for acute sinusitis. 4. No fracture or static subluxation of the cervical spine. 5. 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 Electronically Signed   By: Jearld Lesch M.D.   On: 08/26/2022 14:50   CT Cervical Spine Wo Contrast  Result Date: 08/26/2022 CLINICAL DATA:  Fall EXAM: CT HEAD WITHOUT CONTRAST CT CERVICAL SPINE WITHOUT CONTRAST TECHNIQUE: Multidetector CT imaging of the head and cervical spine was performed following  the standard protocol without intravenous contrast. Multiplanar CT image reconstructions of the cervical spine were also generated. RADIATION DOSE REDUCTION: This exam was performed according to the departmental dose-optimization program which includes automated exposure control, adjustment of the mA and/or kV according to patient size and/or use of iterative reconstruction technique. COMPARISON:  None Available. FINDINGS: CT HEAD FINDINGS Brain: No evidence of acute infarction, hemorrhage, hydrocephalus, extra-axial collection or mass lesion/mass effect. Vascular: No hyperdense vessel or unexpected calcification. Skull: Status post left frontal craniotomy negative for fracture or focal lesion. Sinuses/Orbits: Mucosal thickening and small air-fluid level of the right maxillary sinus (series 2, image 4) Other: None. CT CERVICAL SPINE FINDINGS Alignment: Degenerative straightening and reversal of the normal  cervical lordosis Skull base and vertebrae: No acute fracture. No primary bone lesion or focal pathologic process. Soft tissues and spinal canal: No prevertebral fluid or swelling. No visible canal hematoma. Disc levels: Moderate to severe multilevel disc space height loss and osteophytosis throughout the cervical spine. Upper chest: Negative. Other: None. IMPRESSION: 1. No acute intracranial pathology. 2. Status post left frontal burr hole craniotomy. 3. Mucosal thickening and small air-fluid level of the right maxillary sinus. Correlate for acute sinusitis. 4. No fracture or static subluxation of the cervical spine. 5. 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 Electronically Signed   By: Jearld Lesch M.D.   On: 08/26/2022 14:50   DG Hip Unilat W or Wo Pelvis 2-3 Views Left  Result Date: 08/26/2022 CLINICAL DATA:  Pain after fall EXAM: DG HIP (WITH OR WITHOUT PELVIS) 3V LEFT COMPARISON:  None Available. FINDINGS: There is contrast in the bladder and distal ureters. Hyperostosis. Slight joint space loss of the right hip. No fracture or dislocation. Scattered vascular calcifications. IMPRESSION: Mild degenerative changes. Electronically Signed   By: Karen Kays M.D.   On: 08/26/2022 14:23   CT CORONARY MORPH W/CTA COR W/SCORE W/CA W/CM &/OR WO/CM  Result Date: 08/26/2022 CLINICAL DATA:  Dyspnea on exertion EXAM: Cardiac/Coronary  CTA TECHNIQUE: The patient was scanned on a Siemens Somatom go.Top scanner. : A retrospective scan was triggered in the ascending thoracic aorta. Axial non-contrast 3 mm slices were carried out through the heart. The data set was analyzed on a dedicated work station and scored using the Agatson method. Gantry rotation speed was 330 msecs and collimation was .6 mm. 100mg  of metoprolol and 0.8 mg of sl NTG was given. The 3D data set was reconstructed in 5% intervals of the 60-95 % of the R-R cycle. Diastolic phases were analyzed on a dedicated work station using  MPR, MIP and VRT modes. The patient received 100 cc of contrast. FINDINGS: Aorta: Normal size. Aortic atherosclerosis calcifications. No dissection. Aortic Valve:  Trileaflet.  No calcifications. Coronary Arteries:  Normal coronary origin.  Right dominance. RCA is a dominant artery. There is no plaque. Left main gives rise to LAD and LCX arteries. LM has no disease. LAD has calcified plaque proximally causing minimal stenosis (<25%). LCX is a non-dominant artery.  There is no plaque. Other findings: Normal pulmonary vein drainage into the left atrium. Normal left atrial appendage without a thrombus. Normal size of the pulmonary artery. IMPRESSION: 1. Coronary calcium score of 14.3. This was 12th percentile for age and sex matched control. 2. Normal coronary origin with right dominance. 3. Minimal proximal LAD stenosis (<25%). 4. CAD-RADS 1. Minimal non-obstructive CAD (0-24%). Consider preventive therapy and risk factor modification. Electronically Signed   By: Debbe Odea M.D.   On: 08/26/2022  13:58   ECHOCARDIOGRAM COMPLETE  Result Date: 08/06/2022    ECHOCARDIOGRAM REPORT   Patient Name:   Gadiel Deliman [name] Date of Exam: 08/05/2022 Medical Rec #:  [id number]                 Height:       72.0 in Accession #:    [id number]                Weight:       237.4 lb Date of Birth:  08-06-1945                  BSA:          2.292 m Patient Age:    77 years                  BP:           175/84 mmHg Patient Gender: M                         HR:           60 bpm. Exam Location:  [place] Procedure: 2D Echo, 3D Echo, Cardiac Doppler, Color Doppler and Strain Analysis Indications:    I44.7 LBBB  History:        Patient has no prior history of Echocardiogram examinations.                 Arrythmias:LBBB, Signs/Symptoms:Dizziness/Lightheadedness; Risk                 Factors:Hypertension, Former Smoker, Diabetes and Sleep Apnea.  Sonographer:    Ilda Mori RDCS, MHA Referring Phys: [id number] BRIAN  [place] IMPRESSIONS  1. Left ventricular ejection fraction, by estimation, is 45 to 50%. Left ventricular ejection fraction by 3D volume is 47 %. The left ventricle has mildly decreased function. The left ventricle has no regional wall motion abnormalities. 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 Left ventricular diastolic parameters are consistent with Grade I diastolic dysfunction (impaired relaxation). The average left ventricular global longitudinal strain is -18.8 %. Consider w/u for [facility] if clinically indicated.  2. Right ventricular systolic function is normal. The right ventricular size is normal. Tricuspid regurgitation signal is inadequate for assessing PA pressure.  3. Left atrial size was severely dilated.  4. Right atrial size was mildly dilated.  5. A small pericardial effusion is present. There is no evidence of cardiac tamponade.  6. The mitral valve is normal in structure. Mild mitral valve regurgitation. No evidence of mitral stenosis.  7. The aortic valve is tricuspid. Aortic valve regurgitation is not visualized. Aortic valve sclerosis/calcification is present, without any evidence of aortic stenosis.  8. The inferior vena cava is normal in size with greater than 50% respiratory variability, suggesting right atrial pressure of 3 mmHg. FINDINGS  Left Ventricle: Left ventricular ejection fraction, by estimation, is 45 to 50%. Left ventricular ejection fraction by 3D volume is 47 %. The left ventricle has mildly decreased function. The left ventricle has no regional wall motion abnormalities. The  average left ventricular global longitudinal strain is -18.8 %. The left ventricular internal cavity size was normal in size. There is moderate concentric left ventricular hypertrophy. Left ventricular diastolic parameters are consistent with Grade I diastolic dysfunction (impaired relaxation). Right Ventricle: The right ventricular size is normal.  No increase in right ventricular wall thickness. Right ventricular systolic function is normal. Tricuspid regurgitation signal  is inadequate for assessing PA pressure. Left Atrium: Left atrial size was severely dilated. Right Atrium: Right atrial size was mildly dilated. Pericardium: A small pericardial effusion is present. There is no evidence of cardiac tamponade. Mitral Valve: The mitral valve is normal in structure. There is mild calcification of the mitral valve leaflet(s). Mild mitral valve regurgitation. No evidence of mitral valve stenosis. Tricuspid Valve: The tricuspid valve is normal in structure. Tricuspid valve regurgitation is mild . No evidence of tricuspid stenosis. Aortic Valve: The aortic valve is tricuspid. Aortic valve regurgitation is not visualized. Aortic valve sclerosis/calcification is present, without any evidence of aortic stenosis. Pulmonic Valve: The pulmonic valve was normal in structure. Pulmonic valve regurgitation is not visualized. No evidence of pulmonic stenosis. Aorta: The aortic root is normal in size and structure. Venous: The inferior vena cava is normal in size with greater than 50% respiratory variability, suggesting right atrial pressure of 3 mmHg. IAS/Shunts: No atrial level shunt detected by color flow Doppler.  LEFT VENTRICLE PLAX 2D LVIDd:         5.00 cm         Diastology LVIDs:         4.00 cm         LV e' medial:    4.24 cm/s LV PW:         1.70 cm         LV E/e' medial:  18.8 LV IVS:        1.70 cm         LV e' lateral:   5.87 cm/s                                LV E/e' lateral: 13.6                                 2D                                Longitudinal                                Strain                                2D Strain GLS  -18.8 %                                Avg:                                 3D Volume EF                                LV 3D EF:    Left                                             ventricul  ar                                             ejection                                             fraction                                             by 3D                                             volume is                                             47 %.                                 3D Volume EF:                                3D EF:        47 %                                LV EDV:       204 ml                                LV ESV:       108 ml                                LV SV:        95 ml RIGHT VENTRICLE RV Basal diam:  2.80 cm RV Mid diam:    2.90 cm RV S prime:     20.50 cm/s TAPSE (M-mode): 2.3 cm LEFT ATRIUM              Index        RIGHT ATRIUM           Index LA diam:        6.10 cm  2.66 cm/m   RA Area:     24.80 cm LA Vol (A2C):   182.0 ml 79.42 ml/m  RA Volume:   72.00 ml  31.42 ml/m LA Vol (A4C):   120.0 ml 52.37 ml/m LA Biplane Vol: 150.0 ml 65.46 ml/m   AORTA Ao Root diam: 3.60 cm Ao Asc diam:  3.70 cm MITRAL VALVE MV Area (PHT): 2.80 cm MV Decel Time: 271 msec MV E velocity: 79.70 cm/s MV A velocity: 99.00 cm/s MV E/A ratio:  0.81 Julien Nordmann MD Electronically signed  by Julien Nordmann MD Signature Date/Time: 08/06/2022/8:50:22 AM    Final     Microbiology: Results for orders placed or performed during the hospital encounter of 08/26/22  Culture, blood (x 2)     Status: None   Collection Time: 08/26/22 10:18 PM   Specimen: BLOOD  Result Value Ref Range Status   Specimen Description BLOOD BLOOD LEFT ARM  Final   Special Requests   Final    BOTTLES DRAWN AEROBIC AND ANAEROBIC Blood Culture adequate volume   Culture   Final    NO GROWTH 5 DAYS Performed at [facility], [address] [place], [place] [address]    Report Status 08/31/2022 FINAL  Final  Culture, blood (x 2)     Status: Abnormal   Collection Time: 08/26/22 10:24 PM   Specimen: BLOOD  Result Value Ref Range Status   Specimen Description   Final    BLOOD BLOOD LEFT  HAND Performed at [facility], [address] [place], [place] [address]    Special Requests   Final    BOTTLES DRAWN AEROBIC AND ANAEROBIC Blood Culture adequate volume Performed at [facility], [address] [place], [place] [address]    Culture  Setup Time   Final    GRAM POSITIVE RODS AEROBIC BOTTLE ONLY CRITICAL RESULT CALLED TO, READ BACK BY AND VERIFIED WITH: Deborha Payment AT 08/27/22 2130 AB Performed at [facility], [address] [place], [place] [address]    Culture (A)  Final    BACILLUS SPECIES Standardized susceptibility testing for this organism is not available. Performed at [facility], [address] [place], [place] [address]    Report Status 08/30/2022 FINAL  Final  Urine Culture (for pregnant, neutropenic or urologic patients or patients with an indwelling urinary catheter)     Status: None   Collection Time: 08/26/22 11:00 PM   Specimen: Urine, Clean Catch  Result Value Ref Range Status   Specimen Description   Final    URINE, CLEAN CATCH Performed at [facility], [address] [place], [place] [address]    Special Requests   Final    Immunocompromised Performed at [facility], [address] [place], [place] [address]    Culture   Final    NO GROWTH Performed at [facility], [address] [place], [place] [address]    Report Status 08/28/2022 FINAL  Final  Aerobic/Anaerobic Culture w Gram Stain (surgical/deep wound)     Status: None (Preliminary result)   Collection Time: 08/28/22 11:29 AM   Specimen: Path fluid; Body Fluid  Result Value Ref Range Status   Specimen Description   Final    ABSCESS LEFT THIGH Performed at [facility], [address], [place], [place] [address]    Special Requests   Final    NONE Performed at [facility], [address] [place], [place] [address]    Gram Stain   Final    FEW WBC PRESENT, PREDOMINANTLY PMN RARE GRAM  POSITIVE COCCI Performed at [facility], [address], [place], [place] [address]    Culture   Final    RARE METHICILLIN RESISTANT STAPHYLOCOCCUS AUREUS NO ANAEROBES ISOLATED; CULTURE IN PROGRESS FOR 5 DAYS    Report Status PENDING  Incomplete   Organism ID, Bacteria METHICILLIN RESISTANT STAPHYLOCOCCUS AUREUS  Final      Susceptibility   Methicillin resistant staphylococcus aureus - MIC*  CIPROFLOXACIN >=8 RESISTANT Resistant     ERYTHROMYCIN >=8 RESISTANT Resistant     GENTAMICIN <=0.5 SENSITIVE Sensitive     OXACILLIN >=4 RESISTANT Resistant     TETRACYCLINE <=1 SENSITIVE Sensitive     VANCOMYCIN <=0.5 SENSITIVE Sensitive     TRIMETH/SULFA >=320 RESISTANT Resistant     CLINDAMYCIN <=0.25 SENSITIVE Sensitive     RIFAMPIN <=0.5 SENSITIVE Sensitive     Inducible Clindamycin NEGATIVE Sensitive     LINEZOLID 2 SENSITIVE Sensitive     * RARE METHICILLIN RESISTANT STAPHYLOCOCCUS AUREUS  Resp panel by RT-PCR (RSV, Flu A&B, Covid) Anterior Nasal Swab     Status: None   Collection Time: 08/31/22  5:52 PM   Specimen: Anterior Nasal Swab  Result Value Ref Range Status   SARS Coronavirus 2 by RT PCR NEGATIVE NEGATIVE Final    Comment: (NOTE) SARS-CoV-2 target nucleic acids are NOT DETECTED.  The SARS-CoV-2 RNA is generally detectable in upper respiratory specimens during the acute phase of infection. The lowest concentration of SARS-CoV-2 viral copies this assay can detect is 138 copies/mL. A negative result does not preclude SARS-Cov-2 infection and should not be used as the sole basis for treatment or other patient management decisions. A negative result may occur with  improper specimen collection/handling, submission of specimen other than nasopharyngeal swab, presence of viral mutation(s) within the areas targeted by this assay, and inadequate number of viral copies(<138 copies/mL). A negative result must be combined with clinical observations, patient history, and  epidemiological information. The expected result is Negative.  Fact Sheet for Patients:  BloggerCourse.com  Fact Sheet for Healthcare Providers:  SeriousBroker.it  This test is no t yet approved or cleared by the [place] FDA and  has been authorized for detection and/or diagnosis of SARS-CoV-2 by FDA under an Emergency Use Authorization (EUA). This EUA will remain  in effect (meaning this test can be used) for the duration of the COVID-19 declaration under Section 564(b)(1) of the Act, 21 U.S.C.section 360bbb-3(b)(1), unless the authorization is terminated  or revoked sooner.       Influenza A by PCR NEGATIVE NEGATIVE Final   Influenza B by PCR NEGATIVE NEGATIVE Final    Comment: (NOTE) The Xpert Xpress SARS-CoV-2/FLU/RSV plus assay is intended as an aid in the diagnosis of influenza from Nasopharyngeal swab specimens and should not be used as a sole basis for treatment. Nasal washings and aspirates are unacceptable for Xpert Xpress SARS-CoV-2/FLU/RSV testing.  Fact Sheet for Patients: BloggerCourse.com  Fact Sheet for Healthcare Providers: SeriousBroker.it  This test is not yet approved or cleared by the [place] FDA and has been authorized for detection and/or diagnosis of SARS-CoV-2 by FDA under an Emergency Use Authorization (EUA). This EUA will remain in effect (meaning this test can be used) for the duration of the COVID-19 declaration under Section 564(b)(1) of the Act, 21 U.S.C. section 360bbb-3(b)(1), unless the authorization is terminated or revoked.     Resp Syncytial Virus by PCR NEGATIVE NEGATIVE Final    Comment: (NOTE) Fact Sheet for Patients: BloggerCourse.com  Fact Sheet for Healthcare Providers: SeriousBroker.it  This test is not yet approved or cleared by the [place] FDA and has been  authorized for detection and/or diagnosis of SARS-CoV-2 by FDA under an Emergency Use Authorization (EUA). This EUA will remain in effect (meaning this test can be used) for the duration of the COVID-19 declaration under Section 564(b)(1) of the Act, 21 U.S.C. section 360bbb-3(b)(1), unless the authorization is terminated or revoked.  Performed at [facility], [address] [place], [place] [address]     Labs: CBC: Recent Labs  Lab 08/28/22 [phone] 08/29/22 0443 08/30/22 0551 08/31/22 0603 09/01/22 0425  WBC 12.5* 8.7 8.0 8.2 8.4  NEUTROABS 7.3 4.7 3.8 3.6 3.6  HGB 8.2* 7.5* 7.4* 7.4* 7.1*  HCT 26.2* 24.7* 23.6* 23.8* 23.7*  MCV 89.1 89.5 89.4 89.5 90.5  PLT 161 185 194 230 266   Basic Metabolic Panel: Recent Labs  Lab 08/28/22 0536 08/29/22 0443 08/30/22 0551 08/31/22 0603 09/01/22 0425  NA 137 138 140 141 142  K 3.7 3.3* 3.9 3.5 3.4*  CL 108 109 113* 113* 113*  CO2 23 22 20* 22 22  GLUCOSE 94 189* 101* 98 107*  BUN 15 18 14 13 11   CREATININE 1.20 1.25* 1.24 1.16 1.09  CALCIUM 8.0* 7.9* 7.8* 7.7* 7.8*   Liver Function Tests: No results for input(s): "AST", "ALT", "ALKPHOS", "BILITOT", "PROT", "ALBUMIN" in the last 168 hours. CBG: Recent Labs  Lab 08/31/22 1719 08/31/22 2331 09/01/22 0533 09/01/22 0759 09/01/22 1130  GLUCAP 101* 151* 133* 102* 110*    Discharge time spent: greater than 30 minutes.  Signed: Pennie Banter, DO [facility] 09/01/2022

## 2022-09-01 NOTE — Consult Note (Signed)
[facility] Customer service manager [facility]) Accountable Care Organization (ACO) [facility] Liaison Note  09/01/2022  Paul Andolina [name] 1945-02-22 [id number]  Location: [facility] RN Hospital Liaison screened the patient remotely at [facility].  Insurance: [employer]   Paul Kahle [name] is a 77 y.o. male who is a Primary Care Patient of [place] [place], PA-C. The patient was screened for 7 and 30 day readmission hospitalization with noted extreme risk score for unplanned readmission risk with 2 IP in 6 months.  The patient was assessed for potential [facility]) Care Management service needs for post hospital transition for care coordination. Review of patient's electronic medical record reveals patient was admitted with Cellulitis of his left groin.  Liaison spoke with this pt and he does not have a PCP that he is willing to engage with at this time. Pt indicates he relies on his oncology team to address his ongoing needs and this team follows him very closely. Without a networking PCP that allows our team to follow up with, pt is not eligible for outrservices at this time. Liaison collaborated with TOC concerning pt's disposition.   [facility] Care Management/Population Health does not replace or interfere with any arrangements made by the Inpatient Transition of Care team.   For questions contact:   Elliot Cousin, RN, [facility] Liaison [place]   Population Health Office Hours MTWF  8:00 am-6:00 pm Off on Thursday [phone] mobile [phone] [Office toll free line] Office Hours are M-F 8:30 - 5 pm 24 hour nurse advise line [phone] [email or web address]

## 2022-09-01 NOTE — Plan of Care (Signed)
  Problem: Education: Goal: Knowledge of condition and prescribed therapy will improve Outcome: Progressing   Problem: Skin Integrity: Goal: Risk for impaired skin integrity will decrease Outcome: Progressing   Problem: Fluid Volume: Goal: Hemodynamic stability will improve Outcome: Progressing   Problem: Pain Managment: Goal: General experience of comfort will improve Outcome: Progressing   Problem: Safety: Goal: Ability to remain free from injury will improve Outcome: Progressing

## 2022-09-01 NOTE — Progress Notes (Signed)
Patient is not able to walk the distance required to go the bathroom, or he/she is unable to safely negotiate stairs required to access the bathroom.  A 3in1 BSC will alleviate this problem  

## 2022-09-01 NOTE — Discharge Instructions (Addendum)
In addition to included general post-operative instructions;  Wound care: Pack left groin daily with saline moistened gauze, cover, secure. This should be changed once daily and as needed. Okay to remove to take a short shower. Do not submerge wound.   Medications: Complete Antibiotics as prescribed. Resume all home medications. For mild to moderate pain: acetaminophen (Tylenol) or ibuprofen/naproxen (if no kidney disease). Combining Tylenol with alcohol can substantially increase your risk of causing liver disease. Narcotic pain medications, if prescribed, can be used for severe pain, though may cause nausea, constipation, and drowsiness. Do not combine Tylenol and Percocet (or similar) within a 6 hour period as Percocet (and similar) contain(s) Tylenol. If you do not need the narcotic pain medication, you do not need to fill the prescription.  Call office [phone] / [phone] - Surgery) at any time if any questions, worsening pain, fevers/chills, bleeding, drainage from incision site, or other concerns.   While on the antibiotic linezolid avoid or minimize following foods and drinks as they may interact with linezolid.  These foods and drinks contain tyramine.  Tyramine is a natural product found in some plants and animals.  Too much tyramine in combination with linezolid can cause high levels of serotonin in the body.  Serotonin is a chemical in our body that controls mood, sleep, digestion, and other functions.  Several signs of too much serotonin in the body (also called serotonin syndrome) are fast heart rate, sweating, fevers, high blood pressure, muscle twitching, and confusion.  It is important to seek medical attention if you have these symptoms.   Avoid foods and beverages that are high in tyramine while taking linezolid, including: Alcoholic beverages (such as beer, red wine, vermouth, sherry) Aged cheeses (such as cheddar, blue cheese, swiss, feta, parmesan, camembert) Fermented or  pickled foods (such as sauerkraut, pickled beets, pickled peppers, pickled cucumbers/pickles, kim chee/kimchi)  Dried/aged, smoked or processed meats and sausages (such as salami, pepperoni, liverwurst, hot dogs, bologna, bacon) Soybean products (such as soy sauce, tofu) Preserved fish (such as pickled herring) Products that contain large amounts of yeast (such as bouillon cubes, powdered soup/gravy, homemade or sourdough bread)  Broad/fava beans  Following foods are OK to eat while taking linezolid: Pasteurized cheeses (such as [place], ricotta, cottage cheese, cream cheese) Vegetables (not fermented or pickled) Non-cured or smoked meats

## 2022-09-01 NOTE — Progress Notes (Signed)
Case d/w Dr . Maia Plan . recent Colonoscopy reviewed showing circumferential ascending colon mass, path tubular adenoma although endoscopic images are definitely more concerning and I do believe he will require formal colon resection to address this large circumferential mass. CT A/p No evidence of mets. I will see him in my office for wound and will d/w pt the need for colectomy once he recovers from ID process.

## 2022-09-02 ENCOUNTER — Ambulatory Visit: Payer: No Typology Code available for payment source | Admitting: Gastroenterology

## 2022-09-02 ENCOUNTER — Ambulatory Visit: Admission: RE | Admit: 2022-09-02 | Payer: No Typology Code available for payment source | Source: Ambulatory Visit

## 2022-09-02 ENCOUNTER — Other Ambulatory Visit: Payer: Self-pay

## 2022-09-02 LAB — AEROBIC/ANAEROBIC CULTURE W GRAM STAIN (SURGICAL/DEEP WOUND)

## 2022-09-02 NOTE — Anesthesia Postprocedure Evaluation (Signed)
Anesthesia Post Note  Patient: Paul Edelstein [name]  Procedure(s) Performed: COLONOSCOPY WITH PROPOFOL GIVENS CAPSULE STUDY POLYPECTOMY SUBMUCOSAL TATTOO INJECTION BIOPSY  Patient location during evaluation: PACU Anesthesia Type: General Level of consciousness: awake and oriented Pain management: satisfactory to patient Vital Signs Assessment: post-procedure vital signs reviewed and stable Respiratory status: spontaneous breathing and nonlabored ventilation Cardiovascular status: blood pressure returned to baseline Anesthetic complications: no   No notable events documented.   Last Vitals:  Vitals:   08/24/22 0347 08/24/22 0803  BP: (!) 156/77 (!) 171/84  Pulse: 63 61  Resp: 18 18  Temp: 36.9 C 36.6 C  SpO2: 98% 98%    Last Pain:  Vitals:   08/24/22 0900  TempSrc:   PainSc: 0-No pain                 VAN STAVEREN,

## 2022-09-03 ENCOUNTER — Inpatient Hospital Stay: Payer: No Typology Code available for payment source

## 2022-09-03 ENCOUNTER — Other Ambulatory Visit: Payer: No Typology Code available for payment source

## 2022-09-03 ENCOUNTER — Inpatient Hospital Stay: Payer: No Typology Code available for payment source | Attending: Oncology

## 2022-09-03 ENCOUNTER — Encounter: Payer: Self-pay | Admitting: Oncology

## 2022-09-03 ENCOUNTER — Other Ambulatory Visit: Payer: Self-pay | Admitting: Oncology

## 2022-09-03 ENCOUNTER — Other Ambulatory Visit: Payer: Self-pay

## 2022-09-03 ENCOUNTER — Inpatient Hospital Stay (HOSPITAL_BASED_OUTPATIENT_CLINIC_OR_DEPARTMENT_OTHER): Payer: No Typology Code available for payment source | Admitting: Oncology

## 2022-09-03 VITALS — BP 173/80 | HR 67 | Temp 96.1°F | Resp 18 | Wt 240.7 lb

## 2022-09-03 VITALS — BP 154/55 | HR 60 | Temp 96.4°F | Resp 18

## 2022-09-03 DIAGNOSIS — N1831 Chronic kidney disease, stage 3a: Secondary | ICD-10-CM

## 2022-09-03 DIAGNOSIS — I1310 Hypertensive heart and chronic kidney disease without heart failure, with stage 1 through stage 4 chronic kidney disease, or unspecified chronic kidney disease: Secondary | ICD-10-CM | POA: Insufficient documentation

## 2022-09-03 DIAGNOSIS — K6389 Other specified diseases of intestine: Secondary | ICD-10-CM | POA: Diagnosis not present

## 2022-09-03 DIAGNOSIS — K639 Disease of intestine, unspecified: Secondary | ICD-10-CM | POA: Diagnosis not present

## 2022-09-03 DIAGNOSIS — R5383 Other fatigue: Secondary | ICD-10-CM | POA: Diagnosis not present

## 2022-09-03 DIAGNOSIS — C9110 Chronic lymphocytic leukemia of B-cell type not having achieved remission: Secondary | ICD-10-CM

## 2022-09-03 DIAGNOSIS — D649 Anemia, unspecified: Secondary | ICD-10-CM

## 2022-09-03 DIAGNOSIS — R634 Abnormal weight loss: Secondary | ICD-10-CM | POA: Insufficient documentation

## 2022-09-03 DIAGNOSIS — D126 Benign neoplasm of colon, unspecified: Secondary | ICD-10-CM | POA: Insufficient documentation

## 2022-09-03 DIAGNOSIS — K5730 Diverticulosis of large intestine without perforation or abscess without bleeding: Secondary | ICD-10-CM | POA: Insufficient documentation

## 2022-09-03 DIAGNOSIS — D122 Benign neoplasm of ascending colon: Secondary | ICD-10-CM | POA: Insufficient documentation

## 2022-09-03 DIAGNOSIS — K921 Melena: Secondary | ICD-10-CM | POA: Diagnosis not present

## 2022-09-03 DIAGNOSIS — T148XXA Other injury of unspecified body region, initial encounter: Secondary | ICD-10-CM | POA: Diagnosis not present

## 2022-09-03 DIAGNOSIS — N183 Chronic kidney disease, stage 3 unspecified: Secondary | ICD-10-CM | POA: Diagnosis not present

## 2022-09-03 DIAGNOSIS — D3502 Benign neoplasm of left adrenal gland: Secondary | ICD-10-CM | POA: Insufficient documentation

## 2022-09-03 DIAGNOSIS — D509 Iron deficiency anemia, unspecified: Secondary | ICD-10-CM | POA: Insufficient documentation

## 2022-09-03 LAB — TYPE AND SCREEN
ABO/RH(D): O POS
Antibody Screen: POSITIVE
DAT, IgG: NEGATIVE
DAT, complement: NEGATIVE
Unit division: 0

## 2022-09-03 LAB — RETIC PANEL
Immature Retic Fract: 33.2 % — ABNORMAL HIGH (ref 2.3–15.9)
RBC.: 2.74 MIL/uL — ABNORMAL LOW (ref 4.22–5.81)
Retic Count, Absolute: 98.4 10*3/uL (ref 19.0–186.0)
Retic Ct Pct: 3.6 % — ABNORMAL HIGH (ref 0.4–3.1)
Reticulocyte Hemoglobin: 29.7 pg (ref >27.9)

## 2022-09-03 LAB — CBC WITH DIFFERENTIAL (CANCER CENTER ONLY)
Abs Immature Granulocytes: 0.3 10*3/uL — ABNORMAL HIGH (ref 0.00–0.07)
Basophils Absolute: 0 10*3/uL (ref 0.0–0.1)
Basophils Relative: 0 %
Eosinophils Absolute: 0.5 10*3/uL (ref 0.0–0.5)
Eosinophils Relative: 5 %
HCT: 25.6 % — ABNORMAL LOW (ref 39.0–52.0)
Hemoglobin: 7.6 g/dL — ABNORMAL LOW (ref 13.0–17.0)
Lymphocytes Relative: 26 %
Lymphs Abs: 2.6 10*3/uL (ref 0.7–4.0)
MCH: 27.4 pg (ref 26.0–34.0)
MCHC: 29.7 g/dL — ABNORMAL LOW (ref 30.0–36.0)
MCV: 92.4 fL (ref 80.0–100.0)
Metamyelocytes Relative: 1 %
Monocytes Absolute: 1 10*3/uL (ref 0.1–1.0)
Monocytes Relative: 10 %
Myelocytes: 2 %
Neutro Abs: 5 10*3/uL (ref 1.7–7.7)
Neutrophils Relative %: 50 %
Other: 6 %
Platelet Count: 388 10*3/uL (ref 150–400)
RBC: 2.77 MIL/uL — ABNORMAL LOW (ref 4.22–5.81)
RDW: 19.2 % — ABNORMAL HIGH (ref 11.5–15.5)
Smear Review: NORMAL
WBC Count: 10 10*3/uL (ref 4.0–10.5)
WBC Morphology: ABNORMAL
nRBC: 0 % (ref 0.0–0.2)

## 2022-09-03 LAB — CMP (CANCER CENTER ONLY)
ALT: 24 U/L (ref 0–44)
AST: 45 U/L — ABNORMAL HIGH (ref 15–41)
Albumin: 3.4 g/dL — ABNORMAL LOW (ref 3.5–5.0)
Alkaline Phosphatase: 73 U/L (ref 38–126)
Anion gap: 7 (ref 5–15)
BUN: 13 mg/dL (ref 8–23)
CO2: 23 mmol/L (ref 22–32)
Calcium: 8.2 mg/dL — ABNORMAL LOW (ref 8.9–10.3)
Chloride: 109 mmol/L (ref 98–111)
Creatinine: 1.21 mg/dL (ref 0.61–1.24)
GFR, Estimated: >60 mL/min (ref >60)
Glucose, Bld: 149 mg/dL — ABNORMAL HIGH (ref 70–99)
Potassium: 3.9 mmol/L (ref 3.5–5.1)
Sodium: 139 mmol/L (ref 135–145)
Total Bilirubin: 0.9 mg/dL (ref 0.3–1.2)
Total Protein: 6.1 g/dL — ABNORMAL LOW (ref 6.5–8.1)

## 2022-09-03 LAB — PATHOLOGIST SMEAR REVIEW

## 2022-09-03 LAB — PREPARE RBC (CROSSMATCH)

## 2022-09-03 LAB — SAMPLE TO BLOOD BANK

## 2022-09-03 LAB — BPAM RBC
Blood Product Expiration Date: 202409042359
Unit Type and Rh: 5100

## 2022-09-03 MED ORDER — SODIUM CHLORIDE 0.9 % IV SOLN
200.0000 mg | Freq: Once | INTRAVENOUS | Status: AC
  Administered 2022-09-03: 200 mg via INTRAVENOUS
  Filled 2022-09-03: qty 200

## 2022-09-03 MED ORDER — SODIUM CHLORIDE 0.9 % IV SOLN
Freq: Once | INTRAVENOUS | Status: AC
  Filled 2022-09-03: qty 250

## 2022-09-03 MED FILL — Iron Sucrose Inj 20 MG/ML (Fe Equiv): INTRAVENOUS | Qty: 10 | Status: AC

## 2022-09-03 NOTE — Assessment & Plan Note (Signed)
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

## 2022-09-03 NOTE — Assessment & Plan Note (Signed)
Encourage oral hydration and avoid nephrotoxins.  Creatinine has improved.

## 2022-09-03 NOTE — Assessment & Plan Note (Addendum)
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 Biopsy showed tubular adenoma, negative for malignancy Pathology result was discussed with patient and daughter Discussed about the possibility of false negative results.  Recommend patient to get surgical evaluation.  Discuss case with Dr. Everlene Farrier.  Check CEA

## 2022-09-03 NOTE — Assessment & Plan Note (Addendum)
Labs are reviewed and discussed with patient. CT chest abdomen pelvis results reviewed and discussed with patient.   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 Hold Zanubrutinib 160 mg twice daily due to anemia.

## 2022-09-03 NOTE — Progress Notes (Signed)
Hematology/Oncology Progress note [phone] [phone]        ASSESSMENT & PLAN:   Cancer Staging  CLL (chronic lymphocytic leukemia) (HCC) Staging form: Chronic Lymphocytic Leukemia / Small Lymphocytic Lymphoma, AJCC 8th Edition - Clinical stage from 12/28/2021: Modified Rai Stage III (Modified Rai risk: High, Lymphocytosis: Present, Adenopathy: Present, Organomegaly: Absent, Anemia: Present, Thrombocytopenia: Absent) - Signed by [name], MD on 01/20/2022   CLL (chronic lymphocytic leukemia) [facility]) Labs are reviewed and discussed with patient. CT chest abdomen pelvis results reviewed and discussed with patient.   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 Hold Zanubrutinib 160 mg twice daily due to anemia.     Colonic mass Colonoscopy showed Likely malignant partially obstructing tumor in the distal ascending colon. Biopsy showed tubular adenoma, negative for malignancy Pathology result was discussed with patient and daughter Discussed about the possibility of false negative results.  Recommend patient to get surgical evaluation.  Discuss case with Dr. Everlene Farrier.  Check CEA  IDA (iron deficiency anemia) Anemia is likely multifactorial, could be due to CLL, anemia due to chronic kidney disease and IDA. Lab Results  Component Value Date   HGB 7.6 (L) 09/03/2022   TIBC 295 08/20/2022   IRONPCTSAT 45 (H) 08/20/2022   FERRITIN 12 (L) 08/20/2022    Hemoglobin remains low due to chronic GI blood loss Recommend additional Venofer for 5 doses, and 1 unit of PRBC transfusion to further optimize his hemoglobin level prior to colon surgery   CKD (chronic kidney disease) stage 3, GFR 30-59 ml/min (HCC) Encourage oral hydration and avoid nephrotoxins.  Creatinine has improved.   Orders Placed This Encounter  Procedures   CBC with Differential (Cancer Center Only)    Standing  Status:   Future    Standing Expiration Date:   09/03/2023   CBC with Differential (Cancer Center Only)    Standing Status:   Future    Standing Expiration Date:   09/03/2023   Retic Panel    Standing Status:   Future    Standing Expiration Date:   09/03/2023   Sample to Blood Bank    Standing Status:   Future    Standing Expiration Date:   09/03/2023   Sample to Blood Bank    Standing Status:   Future    Standing Expiration Date:   09/03/2023   Follow up 3 weeks All questions were answered. The patient knows to call the clinic with any problems, questions or concerns.  [name], MD, PhD [facility] Hematology Oncology 09/03/2022    CHIEF COMPLAINTS/PURPOSE OF CONSULTATION:  CLL  HISTORY OF PRESENTING ILLNESS:  Paul Ana [name] 77 y.o. male presents for follow up of CLL I have reviewed his chart and materials related to his cancer extensively and collaborated history with the patient. Summary of oncologic history is as follows: Oncology History  CLL (chronic lymphocytic leukemia) (HCC)  12/25/2021 - 12/26/2021 Hospital Admission   Patient was admitted due to pain and swelling of right ankle, right knee and left wrist.  Patient was found to have WBC 30.4, with neutrophil 29% and lymphocyte 2%.  Peripheral smear showed leukocytosis with slight left shift in myeloid series and lymphocytosis with abnormal morphology.  Uric acid 5.5, worsening renal function with creatinine 1.35, BUN 18, GFR 55 (baseline creatinine 1.08 on 07/06/2021), lactic acid 1.1.  ESR normal, uric acid 5.5 normal, CRP 22.2, LDH 182  Orthopedic surgery was  consulted s/p arthrocentesis, cell count 296 with 72% neutrophil, no crystal. Fluid negative for growth.  Patient received cefepime and vancomycin while in the hospital, Patient was discharged on Keflex for 5 days.   12/28/2021 Cancer Staging   Staging form: Chronic Lymphocytic Leukemia / Small Lymphocytic Lymphoma, AJCC 8th Edition - Clinical stage from  12/28/2021: Modified Rai Stage III (Modified Rai risk: High, Lymphocytosis: Present, Adenopathy: Present, Organomegaly: Absent, Anemia: Present, Thrombocytopenia: Absent) - Signed by [name], MD on 01/20/2022 Stage prefix: Initial diagnosis Hemoglobin (Hgb) (g/dL): 24.4   01/0/2725 Initial Diagnosis   CLL (chronic lymphocytic leukemia)   12/27/21 peripheral blood flowcytometry showed Involvement by CD5+, CD23+, CD20+, CD22+ clonal B cell population, phenotype typical for 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    01/05/2022 Imaging   CT chest abdomen pelvis wo contrast 1. Multiple prominent borderline enlarged and mildly enlarged lymph nodes, most evident in the low anatomic pelvis, as above, compatible with reported clinical history of CLL. 2. There also several small pulmonary nodules in the lungs measuring 5 mm or less in size. This is nonspecific, but statistically likely benign. No follow-up needed if patient is low-risk (and has no known or suspected primary neoplasm). Non-contrast chest CT can be considered in 12 months if patient is high-risk. This recommendation 3. Aortic atherosclerosis, in addition to left main and 2 vessel coronary artery disease. Please note that although the presence of coronary artery calcium documents the presence of coronary artery disease, the severity of this disease and any potential stenosis cannot be assessed on this non-gated CT examination. Assessment for  potential risk factor modification, dietary therapy or pharmacologic therapy may be warranted, if clinically indicated. 4. There are calcifications of the aortic valve. Echocardiographic correlation for evaluation of potential valvular dysfunction may be warranted if clinically indicated. 5. Small left adrenal adenoma, similar to prior  studies. 6. Diverticulosis without evidence of acute diverticulitis at this time. 7. Mild cardiomegaly.     INTERVAL HISTORY Paul Raimer [name] is a 77 y.o. male who has above history reviewed by me today presents for follow up visit for CLL. Off Zanubrutinib 160 mg twice daily + dark stool, no abdominal pain + fatigue 08/23/2022 Colonoscopy showed - Likely malignant partially obstructing tumor in the distal ascending colon. Biopsied. Tattooed. - Three 4 to 6 mm polyps in the transverse colon and in the ascending colon, removed with a cold snare. Resected and retrieved. - Five 3 to 7 mm polyps in the rectum, in the descending colon, in the transverse colon and in the ascending colon, removed with a hot snare. Resected and retrieved. 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 - Non- bleeding internal hemorrhoids.  Pathology showed 1. Ascending Colon Polyp, x2 cold snare - TUBULAR ADENOMA(S) (MULTIPLE FRAGMENTS) - NEGATIVE FOR HIGH-GRADE DYSPLASIA OR MALIGNANCY 2. Ascending Colon Polyp, hot snare - TUBULAR ADENOMA (MULTIPLE FRAGMENTS) - NEGATIVE FOR HIGH-GRADE DYSPLASIA OR MALIGNANCY 3. Ascending Colon Biopsy, mass cbx - TUBULAR ADENOMA (MULTIPLE FRAGMENTS) - SEE NOTE 4. Transverse Colon Polyp, hot snare; cold snare - TUBULAR ADENOMA (MULTIPLE FRAGMENTS) - NEGATIVE FOR HIGH-GRADE DYSPLASIA OR MALIGNANCY 5. Descending Colon Polyp, x2 hot snare - TUBULAR ADENOMA(S) (MULTIPLE FRAGMENTS) - NEGATIVE FOR HIGH-GRADE DYSPLASIA OR MALIGNANCY 6. Rectum, polyp(s), hot snare - TUBULOVILLOUS ADENOMA (MULTIPLE FRAGMENTS) - NEGATIVE FOR HIGH-GRADE DYSPLASIA OR MALIGNANCY I&D Recent admission due to sepsis due to cellulitis,  left groin abscess, s/p I&D   MEDICAL HISTORY:  Past Medical History:  Diagnosis Date   Acute prostatitis    BPH (benign prostatic hyperplasia)    Chronic kidney disease    CLL (chronic lymphocytic leukemia) (HCC)    CLL (chronic lymphocytic leukemia) (HCC)    Diabetes  (HCC)    Dysplasia of prostate    Erectile dysfunction    Grade I diastolic dysfunction    HTN (hypertension)    Hypogonadism in male    Left atrial dilation    Mild mitral regurgitation by prior echocardiogram    Orchitis and epididymitis    Over weight    Palindromic rheumatism, hand    Prostatitis    Rectum pain    Testicle swelling    Testicle tenderness    Testicular mass     SURGICAL HISTORY: Past Surgical History:  Procedure Laterality Date   BACK SURGERY     BIOPSY  08/21/2022   Procedure: BIOPSY;  Surgeon: Jaynie Collins, DO;  Location: [facility] ENDOSCOPY;  Service: Gastroenterology;;   BIOPSY  08/23/2022   Procedure: BIOPSY;  Surgeon: Jaynie Collins, DO;  Location: [facility] ENDOSCOPY;  Service: Gastroenterology;;   COLONOSCOPY WITH PROPOFOL N/A 08/23/2022   Procedure: COLONOSCOPY WITH PROPOFOL;  Surgeon: Jaynie Collins, DO;  Location: [facility] ENDOSCOPY;  Service: Gastroenterology;  Laterality: N/A;   ESOPHAGOGASTRODUODENOSCOPY (EGD) WITH PROPOFOL N/A 08/21/2022   Procedure: ESOPHAGOGASTRODUODENOSCOPY (EGD) WITH PROPOFOL;  Surgeon: Jaynie Collins, DO;  Location: [facility] ENDOSCOPY;  Service: Gastroenterology;  Laterality: N/A;   GIVENS CAPSULE STUDY N/A 08/23/2022   Procedure: GIVENS CAPSULE STUDY;  Surgeon: Jaynie Collins, DO;  Location: [facility] ENDOSCOPY;  Service: Gastroenterology;  Laterality: N/A;   head surgery     from fall  drilled hole in brain to relieve pressure   IRRIGATION AND DEBRIDEMENT ABSCESS Left 08/28/2022   Procedure: IRRIGATION AND DEBRIDEMENT ABSCESS LEFT UPPER THIGH/GROIN;  Surgeon: Leafy Ro, MD;  Location: [facility] ORS;  Service: General;  Laterality: Left;   POLYPECTOMY  08/23/2022   Procedure: POLYPECTOMY;  Surgeon: Jaynie Collins, DO;  Location: [facility] ENDOSCOPY;  Service: Gastroenterology;;   SUBMUCOSAL TATTOO INJECTION  08/23/2022   Procedure: SUBMUCOSAL TATTOO INJECTION;  Surgeon: Jaynie Collins, DO;  Location: [facility]  ENDOSCOPY;  Service: Gastroenterology;;   TONSILLECTOMY      SOCIAL HISTORY: Social History   Socioeconomic History   Marital status: Married    Spouse name: Not on file   Number of children: Not on file   Years of education: Not on file   Highest education level: 5th grade  Occupational History   Not on file  Tobacco Use   Smoking status: Former   Smokeless tobacco: Never   Tobacco comments:    quit 30 years ago  Vaping Use   Vaping status: Never Used  Substance and Sexual Activity   Alcohol use: No    Alcohol/week: 0.0 standard drinks of alcohol   Drug use: No   Sexual activity: Not on file  Other Topics Concern   Not on file  Social History Narrative   Not on file   Social Determinants of Health   Financial Resource Strain: Medium Risk (07/26/2022)   Overall Financial Resource Strain (CARDIA)    Difficulty of Paying Living Expenses: Somewhat hard  Food Insecurity: No Food Insecurity (08/26/2022)   Hunger Vital Sign    Worried About Running Out of Food in the Last Year: Never true    Ran Out of Food  in the Last Year: Never true  Recent Concern: Food Insecurity - Food Insecurity Present (07/26/2022)   Hunger Vital Sign    Worried About Running Out of Food in the Last Year: Sometimes true    Ran Out of Food in the Last Year: Never true  Transportation Needs: No Transportation Needs (08/26/2022)   PRAPARE - Administrator, Civil Service (Medical): No    Lack of Transportation (Non-Medical): No  Physical Activity: Unknown (07/26/2022)   Exercise Vital Sign    Days of Exercise per Week: Patient declined    Minutes of Exercise per Session: 0 min  Stress: Stress Concern Present (07/26/2022)   [employer] of Occupational Health - Occupational Stress Questionnaire    Feeling of Stress : To some extent  Social Connections: Moderately Integrated (07/26/2022)   Social Connection and Isolation Panel [NHANES]    Frequency of Communication with Friends and Family: More  than three times a week    Frequency of Social Gatherings with Friends and Family: Patient declined    Attends Religious Services: More than 4 times per year    Active Member of [employer] or Organizations: No    Attends Banker Meetings: Never    Marital Status: Married  Catering manager Violence: Not At Risk (08/26/2022)   Humiliation, Afraid, Rape, and Kick questionnaire    Fear of Current or Ex-Partner: No    Emotionally Abused: No    Physically Abused: No    Sexually Abused: No    FAMILY HISTORY: Family History  Problem Relation Age of Onset   Cancer Maternal Aunt    Prostate cancer Neg Hx    Kidney disease Neg Hx    Kidney cancer Neg Hx    Bladder Cancer Neg Hx     ALLERGIES:  has No Known Allergies.  MEDICATIONS:  Current Outpatient Medications  Medication Sig Dispense Refill   aspirin EC 81 MG tablet Take 1 tablet (81 mg total) by mouth daily. Swallow whole.     dapagliflozin propanediol (FARXIGA) 10 MG TABS tablet Take 1 tablet (10 mg total) by mouth daily before breakfast. 90 tablet 3   hydrALAZINE (APRESOLINE) 50 MG tablet Take 1 tablet (50 mg total) by mouth in the morning and at bedtime. 60 tablet 3   levothyroxine (SYNTHROID) 200 MCG tablet Take 200 mcg by mouth daily.     linezolid (ZYVOX) 600 MG tablet Take 1 tablet (600 mg total) by mouth 2 (two) times daily for 7 days. 14 tablet 0   metFORMIN (GLUCOPHAGE) 500 MG tablet Take 500 mg by mouth 2 (two) times daily with a meal.     metoprolol succinate (TOPROL XL) 25 MG 24 hr tablet Take 1 tablet (25 mg total) by mouth daily. 90 tablet 3   oxyCODONE-acetaminophen (PERCOCET) 10-325 MG tablet Take 1 tablet by mouth every 8 (eight) hours as needed. 90 tablet 0   [START ON 09/22/2022] oxyCODONE-acetaminophen (PERCOCET) 10-325 MG tablet Take 1 tablet by mouth every 8 (eight) hours as needed. 90 tablet 0   [START ON 10/22/2022] oxyCODONE-acetaminophen (PERCOCET) 10-325 MG tablet Take 1 tablet by mouth every 8 (eight)  hours as needed. 90 tablet 0   pregabalin (LYRICA) 100 MG capsule Take 1 capsule (100 mg total) by mouth 2 (two) times daily. 60 capsule 5   rosuvastatin (CRESTOR) 40 MG tablet Take 1 tablet (40 mg total) by mouth daily. 90 tablet 3   valsartan (DIOVAN) 160 MG tablet Take 160 mg by mouth daily.  zanubrutinib (BRUKINSA) 80 MG capsule Take 2 capsules (160 mg total) by mouth 2 (two) times daily. 120 capsule 1   No current facility-administered medications for this visit.    Review of Systems  Constitutional:  Positive for fatigue. Negative for appetite change, chills, fever and unexpected weight change.  HENT:   Negative for hearing loss and voice change.   Eyes:  Negative for eye problems and icterus.  Respiratory:  Negative for chest tightness, cough and shortness of breath.   Cardiovascular:  Negative for chest pain and leg swelling.  Gastrointestinal:  Negative for abdominal distention and abdominal pain.       Dark stool  Endocrine: Negative for hot flashes.  Genitourinary:  Negative for difficulty urinating, dysuria and frequency.   Musculoskeletal:  Positive for arthralgias.  Skin:  Negative for itching and rash.  Neurological:  Negative for light-headedness and numbness.  Hematological:  Negative for adenopathy. Does not bruise/bleed easily.  Psychiatric/Behavioral:  Negative for confusion.      PHYSICAL EXAMINATION: ECOG PERFORMANCE STATUS: 1 - Symptomatic but completely ambulatory  Vitals:   09/03/22 1206  BP: (!) 173/80  Pulse: 67  Resp: 18  Temp: (!) 96.1 F (35.6 C)  SpO2: 100%   Filed Weights   09/03/22 1206  Weight: 240 lb 11.2 oz (109.2 kg)    Physical Exam Constitutional:      General: He is not in acute distress.    Appearance: He is obese. He is not diaphoretic.  HENT:     Head: Normocephalic and atraumatic.  Eyes:     General: No scleral icterus.    Pupils: Pupils are equal, round, and reactive to light.  Cardiovascular:     Rate and Rhythm:  Normal rate.     Heart sounds: No murmur heard. Pulmonary:     Effort: Pulmonary effort is normal. No respiratory distress.     Breath sounds: No wheezing.  Abdominal:     General: There is no distension.     Palpations: Abdomen is soft.     Tenderness: There is no abdominal tenderness.  Musculoskeletal:     Cervical back: Normal range of motion and neck supple.  Skin:    General: Skin is warm and dry.     Findings: No erythema.  Neurological:     Mental Status: He is alert and oriented to person, place, and time. Mental status is at baseline.     Cranial Nerves: No cranial nerve deficit.     Motor: No abnormal muscle tone.  Psychiatric:        Mood and Affect: Mood and affect normal.      LABORATORY DATA:  I have reviewed the data as listed    Latest Ref Rng & Units 09/03/2022   11:48 AM 09/01/2022    4:25 AM 08/31/2022    6:03 AM  CBC  WBC 4.0 - 10.5 K/uL 10.0  8.4  8.2   Hemoglobin 13.0 - 17.0 g/dL 7.6  7.1  7.4   Hematocrit 39.0 - 52.0 % 25.6  23.7  23.8   Platelets 150 - 400 K/uL 388  266  230       Latest Ref Rng & Units 09/03/2022   11:48 AM 09/01/2022    4:25 AM 08/31/2022    6:03 AM  CMP  Glucose 70 - 99 mg/dL 361  443  98   BUN 8 - 23 mg/dL 13  11  13    Creatinine 0.61 - 1.24 mg/dL 1.54  1.09  1.16   Sodium 135 - 145 mmol/L 139  142  141   Potassium 3.5 - 5.1 mmol/L 3.9  3.4  3.5   Chloride 98 - 111 mmol/L 109  113  113   CO2 22 - 32 mmol/L 23  22  22    Calcium 8.9 - 10.3 mg/dL 8.2  7.8  7.7   Total Protein 6.5 - 8.1 g/dL 6.1     Total Bilirubin 0.3 - 1.2 mg/dL 0.9     Alkaline Phos 38 - 126 U/L 73     AST 15 - 41 U/L 45     ALT 0 - 44 U/L 24        RADIOGRAPHIC STUDIES: I have personally reviewed the radiological images as listed and agreed with the findings in the report. CT CORONARY MORPH W/CTA COR W/SCORE W/CA W/CM &/OR WO/CM  Addendum Date: 09/02/2022   ADDENDUM REPORT: 09/02/2022 01:11 EXAM: OVER-READ INTERPRETATION  CT CHEST The following report is an  over-read performed by radiologist Dr. Aram Candela of [facility] on 09/02/2022. This over-read does not include interpretation of cardiac or coronary anatomy or pathology. The coronary calcium score/coronary CTA interpretation by the cardiologist is attached. COMPARISON:  January 04, 2022 FINDINGS: Cardiovascular: There are no significant extracardiac vascular findings. Mediastinum/Nodes: There are no enlarged lymph nodes within the visualized mediastinum. Lungs/Pleura: There is no pleural effusion. A stable ill-defined 3 mm noncalcified lung nodule is seen within the anterolateral aspect of the right upper lobe (axial CT image 11, CT series 15). An additional stable 4 mm posterior right lower lobe noncalcified lung nodule is seen (axial CT image 16, CT series 15). The left lower lobe lung nodule seen on the prior study is not clearly identified. Upper abdomen: No significant findings in the visualized upper abdomen. Musculoskeletal/Chest wall: No chest wall mass or suspicious osseous findings within the visualized chest. IMPRESSION: Stable 3 mm right upper lobe and 4 mm right lower lobe noncalcified lung nodules. No follow-up needed if patient is low-risk (and has no known or suspected primary neoplasm). Non-contrast chest CT can be considered in 12 months if patient is high-risk. This recommendation follows the consensus statement: Guidelines for Management of Incidental Pulmonary Nodules Detected on CT Images: From the Fleischner Society 2017; Radiology 2017; 284:228-243. Electronically Signed   By: Aram Candela M.D.   On: 09/02/2022 01:11   Result Date: 09/02/2022 CLINICAL DATA:  Dyspnea on exertion EXAM: Cardiac/Coronary  CTA TECHNIQUE: The patient was scanned on a Siemens Somatom go.Top scanner. : A retrospective scan was triggered in the ascending thoracic aorta. Axial non-contrast 3 mm slices were carried out through the heart. The data set was analyzed on a dedicated work station and  scored using the Agatson method. Gantry rotation speed was 330 msecs and collimation was .6 mm. 100mg  of metoprolol and 0.8 mg of sl NTG was given. The 3D data set was reconstructed in 5% intervals of the 60-95 % of the R-R cycle. Diastolic phases were analyzed on a dedicated work station using MPR, MIP and VRT modes. The patient received 100 cc of contrast. FINDINGS: Aorta: Normal size. Aortic atherosclerosis calcifications. No dissection. Aortic Valve:  Trileaflet.  No calcifications. Coronary Arteries:  Normal coronary origin.  Right dominance. RCA is a dominant artery. There is no plaque. Left main gives rise to LAD and LCX arteries. LM has no disease. LAD has calcified plaque proximally causing minimal stenosis (<25%). LCX is a non-dominant artery.  There is no  plaque. Other findings: Normal pulmonary vein drainage into the left atrium. Normal left atrial appendage without a thrombus. Normal size of the pulmonary artery. IMPRESSION: 1. Coronary calcium score of 14.3. This was 12th percentile for age and sex matched control. 2. Normal coronary origin with right dominance. 3. Minimal proximal LAD stenosis (<25%). 4. CAD-RADS 1. Minimal non-obstructive CAD (0-24%). Consider preventive therapy and risk factor modification. Electronically Signed: By: Debbe Odea M.D. On: 08/26/2022 13:58   DG Chest Port 1 View  Result Date: 08/26/2022 CLINICAL DATA:  Frequent falls EXAM: PORTABLE CHEST 1 VIEW COMPARISON:  Chest x-ray 10/10/2014 FINDINGS: The heart is enlarged, unchanged. The lungs are clear. There is no pleural effusion or pneumothorax. No acute fractures are seen. IMPRESSION: 1. No active disease. 2. Cardiomegaly. Electronically Signed   By: Darliss Cheney M.D.   On: 08/26/2022 22:45   CT ABDOMEN PELVIS W CONTRAST  Result Date: 08/26/2022 CLINICAL DATA:  Loss of balance, fell, abdominal discomfort and swelling, history of CLL EXAM: CT ABDOMEN AND PELVIS WITH CONTRAST TECHNIQUE: Multidetector CT imaging  of the abdomen and pelvis was performed using the standard protocol following bolus administration of intravenous contrast. RADIATION DOSE REDUCTION: This exam was performed according to the departmental dose-optimization program which includes automated exposure control, adjustment of the mA and/or kV according to patient size and/or use of iterative reconstruction technique. CONTRAST:  OMNIPAQUE IOHEXOL 300 MG/ML  SOLN COMPARISON:  01/04/2022 FINDINGS: Lower chest: Cardiomegaly without pericardial effusion. No acute pleural or parenchymal lung disease. Hepatobiliary: No hepatic injury or perihepatic hematoma. Gallbladder is unremarkable. Pancreas: Unremarkable. No pancreatic ductal dilatation or surrounding inflammatory changes. Spleen: 1.9 cm hypodensity within the medial aspect of the spleen image 17/2 is nonspecific. The remainder of the spleen is unremarkable. No evidence of splenomegaly. Adrenals/Urinary Tract: Excreted contrast within the kidneys from CT coronary angiogram performed earlier today. Multiple bilateral renal cortical cysts do not require specific imaging follow-up. No obstructive uropathy within either kidney. No filling defects within the bladder. 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 Stomach/Bowel: No bowel obstruction or ileus. Normal appendix right lower quadrant. Scattered distal colonic diverticulosis without diverticulitis. No bowel wall thickening or inflammatory change. Vascular/Lymphatic: Stable aortic atherosclerosis. Continued retroperitoneal and pelvic adenopathy. Index lymph nodes are as follows: Left external iliac, image 76/2, 2.9 cm in short axis. Previously 2.6 cm. Right external iliac, image 73/2, 1.0 cm in short axis. Previously 1.7 cm. Right external iliac, image 67/2, 1.5 cm in short axis. Previously 2.1 cm. No new adenopathy. Reproductive: Prostate is unremarkable. Other: No free fluid or free intraperitoneal gas. No abdominal wall  hernia. Musculoskeletal: There is subcutaneous fat stranding within the anterior aspect of the proximal left thigh, extending into the left lower quadrant anterior abdominal wall, which may reflect contusion after trauma. There is no fluid collection or hematoma. There are no acute displaced fractures. Reconstructed images demonstrate no additional findings. IMPRESSION: 1. Subcutaneous fat stranding within the left lower quadrant anterior abdominal wall extending into the anterior proximal left thigh, likely related to contusion given history of recent trauma. No fluid collection or hematoma. 2. No acute displaced fracture. 3. Retroperitoneal and pelvic lymphadenopathy as above, with waxing and waning appearance. 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 4. 1.9 cm hypodensity within the medial aspect of the spleen, nonspecific. Leukemic involvement of the spleen cannot be excluded. 5. Distal colonic diverticulosis without diverticulitis. 6.  Aortic Atherosclerosis (ICD10-I70.0). Electronically Signed   By: Sharlet Salina  M.D.   On: 08/26/2022 19:54   CT Head Wo Contrast  Result Date: 08/26/2022 CLINICAL DATA:  Fall EXAM: CT HEAD WITHOUT CONTRAST CT CERVICAL SPINE WITHOUT CONTRAST TECHNIQUE: Multidetector CT imaging of the head and cervical spine was performed following the standard protocol without intravenous contrast. Multiplanar CT image reconstructions of the cervical spine were also generated. RADIATION DOSE REDUCTION: This exam was performed according to the departmental dose-optimization program which includes automated exposure control, adjustment of the mA and/or kV according to patient size and/or use of iterative reconstruction technique. COMPARISON:  None Available. FINDINGS: CT HEAD FINDINGS Brain: No evidence of acute infarction, hemorrhage, hydrocephalus, extra-axial collection or mass lesion/mass effect. Vascular: No hyperdense vessel or unexpected calcification. Skull: Status post left  frontal craniotomy negative for fracture or focal lesion. Sinuses/Orbits: Mucosal thickening and small air-fluid level of the right maxillary sinus (series 2, image 4) Other: None. CT CERVICAL SPINE FINDINGS Alignment: Degenerative straightening and reversal of the normal cervical lordosis Skull base and vertebrae: No acute fracture. No primary bone lesion or focal pathologic process. Soft tissues and spinal canal: No prevertebral fluid or swelling. No visible canal hematoma. Disc levels: Moderate to severe multilevel disc space height loss and osteophytosis throughout the cervical spine. Upper chest: Negative. Other: None. IMPRESSION: 1. No acute intracranial pathology. 2. Status post left frontal burr hole craniotomy. 3. Mucosal thickening and small air-fluid level of the right maxillary sinus. Correlate for acute sinusitis. 4. No fracture or static subluxation of the cervical spine. 5. 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 Electronically Signed   By: Jearld Lesch M.D.   On: 08/26/2022 14:50   CT Cervical Spine Wo Contrast  Result Date: 08/26/2022 CLINICAL DATA:  Fall EXAM: CT HEAD WITHOUT CONTRAST CT CERVICAL SPINE WITHOUT CONTRAST TECHNIQUE: Multidetector CT imaging of the head and cervical spine was performed following the standard protocol without intravenous contrast. Multiplanar CT image reconstructions of the cervical spine were also generated. RADIATION DOSE REDUCTION: This exam was performed according to the departmental dose-optimization program which includes automated exposure control, adjustment of the mA and/or kV according to patient size and/or use of iterative reconstruction technique. COMPARISON:  None Available. FINDINGS: CT HEAD FINDINGS Brain: No evidence of acute infarction, hemorrhage, hydrocephalus, extra-axial collection or mass lesion/mass effect. Vascular: No hyperdense vessel or unexpected calcification. Skull: Status post left frontal craniotomy negative  for fracture or focal lesion. Sinuses/Orbits: Mucosal thickening and small air-fluid level of the right maxillary sinus (series 2, image 4) Other: None. CT CERVICAL SPINE FINDINGS Alignment: Degenerative straightening and reversal of the normal cervical lordosis Skull base and vertebrae: No acute fracture. No primary bone lesion or focal pathologic process. Soft tissues and spinal canal: No prevertebral fluid or swelling. No visible canal hematoma. Disc levels: Moderate to severe multilevel disc space height loss and osteophytosis throughout the cervical spine. Upper chest: Negative. Other: None. IMPRESSION: 1. No acute intracranial pathology. 2. Status post left frontal burr hole craniotomy. 3. Mucosal thickening and small air-fluid level of the right maxillary sinus. Correlate for acute sinusitis. 4. No fracture or static subluxation of the cervical spine. 5. 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 Electronically Signed   By: Jearld Lesch M.D.   On: 08/26/2022 14:50   DG Hip Unilat W or Wo Pelvis 2-3 Views Left  Result Date: 08/26/2022 CLINICAL DATA:  Pain after fall EXAM: DG HIP (WITH OR WITHOUT PELVIS) 3V LEFT COMPARISON:  None Available. FINDINGS: There is contrast in the bladder and  distal ureters. Hyperostosis. Slight joint space loss of the right hip. No fracture or dislocation. Scattered vascular calcifications. IMPRESSION: Mild degenerative changes. Electronically Signed   By: Karen Kays M.D.   On: 08/26/2022 14:23   ECHOCARDIOGRAM COMPLETE  Result Date: 08/06/2022    ECHOCARDIOGRAM REPORT   Patient Name:   Paul Debarge [name] Date of Exam: 08/05/2022 Medical Rec #:  [id number]                 Height:       72.0 in Accession #:    [id number]                Weight:       237.4 lb Date of Birth:  07-19-1945                  BSA:          2.292 m Patient Age:    77 years                  BP:           175/84 mmHg Patient Gender: M                         HR:           60  bpm. Exam Location:  [facility] Procedure: 2D Echo, 3D Echo, Cardiac Doppler, Color Doppler and Strain Analysis Indications:    I44.7 LBBB  History:        Patient has no prior history of Echocardiogram examinations.                 Arrythmias:LBBB, Signs/Symptoms:Dizziness/Lightheadedness; Risk                 Factors:Hypertension, Former Smoker, Diabetes and Sleep Apnea.  Sonographer:    Ilda Mori RDCS, MHA Referring Phys: [id number] BRIAN AGBOR-ETANG IMPRESSIONS  1. Left ventricular ejection fraction, by estimation, is 45 to 50%. Left ventricular ejection fraction by 3D volume is 47 %. The left ventricle has mildly decreased function. The left ventricle has no regional wall motion abnormalities. 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 Left ventricular diastolic parameters are consistent with Grade I diastolic dysfunction (impaired relaxation). The average left ventricular global longitudinal strain is -18.8 %. Consider w/u for [facility] if clinically indicated.  2. Right ventricular systolic function is normal. The right ventricular size is normal. Tricuspid regurgitation signal is inadequate for assessing PA pressure.  3. Left atrial size was severely dilated.  4. Right atrial size was mildly dilated.  5. A small pericardial effusion is present. There is no evidence of cardiac tamponade.  6. The mitral valve is normal in structure. Mild mitral valve regurgitation. No evidence of mitral stenosis.  7. The aortic valve is tricuspid. Aortic valve regurgitation is not visualized. Aortic valve sclerosis/calcification is present, without any evidence of aortic stenosis.  8. The inferior vena cava is normal in size with greater than 50% respiratory variability, suggesting right atrial pressure of 3 mmHg. FINDINGS  Left Ventricle: Left ventricular ejection fraction, by estimation, is 45 to 50%. Left ventricular ejection fraction by 3D volume is 47 %. The left ventricle  has mildly decreased function. The left ventricle has no regional wall motion abnormalities. The  average left ventricular global longitudinal strain is -18.8 %. The left ventricular internal cavity size was normal in size. There is moderate concentric left ventricular hypertrophy. Left ventricular  diastolic parameters are consistent with Grade I diastolic dysfunction (impaired relaxation). Right Ventricle: The right ventricular size is normal. No increase in right ventricular wall thickness. Right ventricular systolic function is normal. Tricuspid regurgitation signal is inadequate for assessing PA pressure. Left Atrium: Left atrial size was severely dilated. Right Atrium: Right atrial size was mildly dilated. Pericardium: A small pericardial effusion is present. There is no evidence of cardiac tamponade. Mitral Valve: The mitral valve is normal in structure. There is mild calcification of the mitral valve leaflet(s). Mild mitral valve regurgitation. No evidence of mitral valve stenosis. Tricuspid Valve: The tricuspid valve is normal in structure. Tricuspid valve regurgitation is mild . No evidence of tricuspid stenosis. Aortic Valve: The aortic valve is tricuspid. Aortic valve regurgitation is not visualized. Aortic valve sclerosis/calcification is present, without any evidence of aortic stenosis. Pulmonic Valve: The pulmonic valve was normal in structure. Pulmonic valve regurgitation is not visualized. No evidence of pulmonic stenosis. Aorta: The aortic root is normal in size and structure. Venous: The inferior vena cava is normal in size with greater than 50% respiratory variability, suggesting right atrial pressure of 3 mmHg. IAS/Shunts: No atrial level shunt detected by color flow Doppler.  LEFT VENTRICLE PLAX 2D LVIDd:         5.00 cm         Diastology LVIDs:         4.00 cm         LV e' medial:    4.24 cm/s LV PW:         1.70 cm         LV E/e' medial:  18.8 LV IVS:        1.70 cm         LV e' lateral:    5.87 cm/s                                LV E/e' lateral: 13.6                                 2D                                Longitudinal                                Strain                                2D Strain GLS  -18.8 %                                Avg:                                 3D Volume EF                                LV 3D EF:    Left  ventricul                                             ar                                             ejection                                             fraction                                             by 3D                                             volume is                                             47 %.                                 3D Volume EF:                                3D EF:        47 %                                LV EDV:       204 ml                                LV ESV:       108 ml                                LV SV:        95 ml RIGHT VENTRICLE RV Basal diam:  2.80 cm RV Mid diam:    2.90 cm RV S prime:     20.50 cm/s TAPSE (M-mode): 2.3 cm LEFT ATRIUM              Index        RIGHT ATRIUM           Index LA diam:        6.10 cm  2.66 cm/m   RA Area:     24.80 cm LA Vol (A2C):   182.0 ml 79.42 ml/m  RA Volume:   72.00 ml  31.42 ml/m LA Vol (A4C):   120.0 ml 52.37 ml/m LA Biplane Vol: 150.0 ml 65.46 ml/m  AORTA Ao Root diam: 3.60 cm Ao Asc diam:  3.70 cm MITRAL VALVE MV Area (PHT): 2.80 cm MV Decel Time: 271 msec MV E velocity: 79.70 cm/s MV A velocity: 99.00 cm/s MV E/A ratio:  0.81 Julien Nordmann MD Electronically signed by Julien Nordmann MD Signature Date/Time: 08/06/2022/8:50:22 AM    Final

## 2022-09-06 ENCOUNTER — Inpatient Hospital Stay: Payer: No Typology Code available for payment source

## 2022-09-06 DIAGNOSIS — C9110 Chronic lymphocytic leukemia of B-cell type not having achieved remission: Secondary | ICD-10-CM | POA: Diagnosis not present

## 2022-09-06 DIAGNOSIS — D649 Anemia, unspecified: Secondary | ICD-10-CM

## 2022-09-06 MED ORDER — SODIUM CHLORIDE 0.9% IV SOLUTION
250.0000 mL | Freq: Once | INTRAVENOUS | Status: AC
  Administered 2022-09-06: 250 mL via INTRAVENOUS
  Filled 2022-09-06: qty 250

## 2022-09-06 MED ORDER — DIPHENHYDRAMINE HCL 25 MG PO CAPS
25.0000 mg | ORAL_CAPSULE | Freq: Once | ORAL | Status: AC
  Administered 2022-09-06: 25 mg via ORAL
  Filled 2022-09-06: qty 1

## 2022-09-06 MED ORDER — ACETAMINOPHEN 325 MG PO TABS
650.0000 mg | ORAL_TABLET | Freq: Once | ORAL | Status: AC
  Administered 2022-09-06: 650 mg via ORAL
  Filled 2022-09-06: qty 2

## 2022-09-06 NOTE — Patient Instructions (Signed)

## 2022-09-07 ENCOUNTER — Inpatient Hospital Stay: Payer: No Typology Code available for payment source

## 2022-09-07 ENCOUNTER — Other Ambulatory Visit: Payer: Self-pay

## 2022-09-07 ENCOUNTER — Inpatient Hospital Stay: Payer: No Typology Code available for payment source | Admitting: Oncology

## 2022-09-07 VITALS — BP 186/76 | HR 46 | Temp 98.0°F | Resp 18

## 2022-09-07 DIAGNOSIS — D649 Anemia, unspecified: Secondary | ICD-10-CM | POA: Diagnosis not present

## 2022-09-07 DIAGNOSIS — C9110 Chronic lymphocytic leukemia of B-cell type not having achieved remission: Secondary | ICD-10-CM | POA: Diagnosis not present

## 2022-09-07 DIAGNOSIS — S301XXA Contusion of abdominal wall, initial encounter: Secondary | ICD-10-CM | POA: Diagnosis not present

## 2022-09-07 DIAGNOSIS — K6389 Other specified diseases of intestine: Secondary | ICD-10-CM | POA: Diagnosis not present

## 2022-09-07 DIAGNOSIS — I429 Cardiomyopathy, unspecified: Secondary | ICD-10-CM | POA: Diagnosis not present

## 2022-09-07 DIAGNOSIS — E785 Hyperlipidemia, unspecified: Secondary | ICD-10-CM | POA: Diagnosis not present

## 2022-09-07 DIAGNOSIS — Z09 Encounter for follow-up examination after completed treatment for conditions other than malignant neoplasm: Secondary | ICD-10-CM | POA: Diagnosis not present

## 2022-09-07 DIAGNOSIS — E119 Type 2 diabetes mellitus without complications: Secondary | ICD-10-CM | POA: Diagnosis not present

## 2022-09-07 DIAGNOSIS — G4733 Obstructive sleep apnea (adult) (pediatric): Secondary | ICD-10-CM | POA: Diagnosis not present

## 2022-09-07 DIAGNOSIS — M48062 Spinal stenosis, lumbar region with neurogenic claudication: Secondary | ICD-10-CM | POA: Diagnosis not present

## 2022-09-07 DIAGNOSIS — N1831 Chronic kidney disease, stage 3a: Secondary | ICD-10-CM | POA: Diagnosis not present

## 2022-09-07 MED ORDER — SODIUM CHLORIDE 0.9 % IV SOLN
Freq: Once | INTRAVENOUS | Status: AC
  Filled 2022-09-07: qty 250

## 2022-09-07 MED ORDER — SODIUM CHLORIDE 0.9 % IV SOLN
200.0000 mg | Freq: Once | INTRAVENOUS | Status: DC
  Filled 2022-09-07: qty 10

## 2022-09-07 MED ORDER — SODIUM CHLORIDE 0.9 % IV SOLN
200.0000 mg | Freq: Once | INTRAVENOUS | Status: AC
  Administered 2022-09-07: 200 mg via INTRAVENOUS
  Filled 2022-09-07: qty 200

## 2022-09-07 NOTE — Patient Instructions (Signed)
 Iron Sucrose Injection What is this medication? IRON SUCROSE (EYE ern SOO krose) treats low levels of iron (iron deficiency anemia) in people with kidney disease. Iron is a mineral that plays an important role in making red blood cells, which carry oxygen from your lungs to the rest of your body. This medicine may be used for other purposes; ask your health care provider or pharmacist if you have questions. COMMON BRAND NAME(S): Venofer What should I tell my care team before I take this medication? They need to know if you have any of these conditions: Anemia not caused by low iron levels Heart disease High levels of iron in the blood Kidney disease Liver disease An unusual or allergic reaction to iron, other medications, foods, dyes, or preservatives Pregnant or trying to get pregnant Breastfeeding How should I use this medication? This medication is for infusion into a vein. It is given in a hospital or clinic setting. Talk to your care team about the use of this medication in children. While this medication may be prescribed for children as young as 2 years for selected conditions, precautions do apply. Overdosage: If you think you have taken too much of this medicine contact a poison control center or emergency room at once. NOTE: This medicine is only for you. Do not share this medicine with others. What if I miss a dose? Keep appointments for follow-up doses. It is important not to miss your dose. Call your care team if you are unable to keep an appointment. What may interact with this medication? Do not take this medication with any of the following: Deferoxamine Dimercaprol Other iron products This medication may also interact with the following: Chloramphenicol Deferasirox This list may not describe all possible interactions. Give your health care provider a list of all the medicines, herbs, non-prescription drugs, or dietary supplements you use. Also tell them if you smoke,  drink alcohol, or use illegal drugs. Some items may interact with your medicine. What should I watch for while using this medication? Visit your care team regularly. Tell your care team if your symptoms do not start to get better or if they get worse. You may need blood work done while you are taking this medication. You may need to follow a special diet. Talk to your care team. Foods that contain iron include: whole grains/cereals, dried fruits, beans, or peas, leafy green vegetables, and organ meats (liver, kidney). What side effects may I notice from receiving this medication? Side effects that you should report to your care team as soon as possible: Allergic reactions--skin rash, itching, hives, swelling of the face, lips, tongue, or throat Low blood pressure--dizziness, feeling faint or lightheaded, blurry vision Shortness of breath Side effects that usually do not require medical attention (report to your care team if they continue or are bothersome): Flushing Headache Joint pain Muscle pain Nausea Pain, redness, or irritation at injection site This list may not describe all possible side effects. Call your doctor for medical advice about side effects. You may report side effects to FDA at 1-800-FDA-1088. Where should I keep my medication? This medication is given in a hospital or clinic. It will not be stored at home. NOTE: This sheet is a summary. It may not cover all possible information. If you have questions about this medicine, talk to your doctor, pharmacist, or health care provider.  2024 Elsevier/Gold Standard (2022-06-18 00:00:00)

## 2022-09-08 ENCOUNTER — Encounter: Payer: Self-pay | Admitting: Surgery

## 2022-09-08 ENCOUNTER — Ambulatory Visit (INDEPENDENT_AMBULATORY_CARE_PROVIDER_SITE_OTHER): Payer: No Typology Code available for payment source | Admitting: Surgery

## 2022-09-08 VITALS — BP 181/84 | HR 67 | Temp 97.6°F | Ht 73.0 in | Wt 232.2 lb

## 2022-09-08 DIAGNOSIS — K429 Umbilical hernia without obstruction or gangrene: Secondary | ICD-10-CM

## 2022-09-08 DIAGNOSIS — K6389 Other specified diseases of intestine: Secondary | ICD-10-CM

## 2022-09-08 DIAGNOSIS — R69 Illness, unspecified: Secondary | ICD-10-CM | POA: Diagnosis not present

## 2022-09-08 NOTE — Patient Instructions (Signed)
If you have any concerns or questions, please feel free to call our office. See follow up appointment.   Minimally Invasive Partial Colectomy, Adult  Minimally invasive partial colectomy is surgery to remove part of your large intestine (colon). Minimally invasive surgery is done through several small incisions in the abdomen instead of one large incision (open surgery). Minimally invasive surgery is also called laparoscopic surgery. Minimally invasive surgery may be less painful than open surgery. You also may not have to stay in the hospital as long, and your recovery time may be shorter. This procedure may be used to treat several conditions that affect the colon, including: Tumors or masses. Bleeding. Blockages or obstruction. Inflammatory bowel diseases, such as Crohn's disease, ulcerative colitis, or diverticulitis. Cancer. Tell your health care provider about: Any allergies you have. All medicines you are taking, including vitamins, herbs, eye drops, creams, and over-the-counter medicines. Any problems you or family members have had with anesthetic medicines. Any bleeding problems you have. Any surgeries you have had. Any medical conditions you have. Whether you are pregnant or may be pregnant. If you have a current history of: Nicotine or tobacco use. Alcohol or drug use. What are the risks? Your health care provider will talk with you about risks. These may include: Infection. Bleeding. Allergic reactions to medicines or dyes. Damage to nearby structures or organs. Future blockage of the intestines from scar tissue. Another surgery may be needed to repair this. Leaking of bowel contents from where the colon is reattached. Needing to change to an open procedure. A blood clot that forms in a leg, then breaks loose and travels to the lungs (pulmonary embolism). What happens before the surgery? Eating and drinking Follow instructions from your health care provider about what  you may eat and drink. These may include: 8 hours before your procedure Stop eating most foods. Do not eat meat, fried foods, or fatty foods. Eat only light foods, such as toast or crackers. All liquids are okay except energy drinks and alcohol. 6 hours before your procedure Stop eating. Drink only clear liquids, such as water, clear fruit juice, black coffee, plain tea, and sports drinks. Do not drink energy drinks or alcohol. 2 hours before your procedure Stop drinking all liquids. You may be allowed to take medicines with small sips of water. If you do not follow your health care provider's instructions, your procedure may be delayed or canceled. Medicines You may be asked to take a medicine to empty your colon (bowel preparation). Follow instructions from your health care provider about how to do this. Do not eat or drink anything else after you have started the bowel prep, unless your health care provider tells you it is safe to do so. Ask your health care provider about: Changing or stopping your regular medicines. These include any diabetes medicines or blood thinners you take. Taking medicines such as aspirin or ibuprofen. These medicines can thin your blood. Do not take them unless your health care provider tells you to. Taking over-the-counter medicines, vitamins, herbs, and supplements. Surgery safety Ask your health care provider: How your surgery site will be marked. What steps will be taken to help prevent infection. These may include: Removing hair at the surgery site. Washing skin with a soap that kills germs. Taking antibiotics to clean out bacteria from your colon. Follow the directions carefully and take the medicine at the correct time. General instructions Do not use any products that contain nicotine or tobacco for at least 4  weeks before the procedure. These products include cigarettes, chewing tobacco, and vaping devices, such as e-cigarettes. If you need help  quitting, ask your health care provider. Plan to have a responsible adult take you home from the hospital. What happens during the surgery?  An IV will be inserted into one of your veins. You may be given: A sedative. This helps you relax. Anesthesia. This will: Numb certain areas of your body. Make you fall asleep for surgery. Several small incisions will be made in your abdomen. Surgical instruments called trocars will be inserted into the small incisions. A thin metal scope with a light and camera on the end (laparoscope) will be inserted into one of the trocars. The camera on the laparoscope will send a picture to a computer screen. If needed, a surgical robot will be used to control the laparoscope and the instruments from a computer console (robot-assisted laparoscopic procedure). The images from the laparoscope will appear on the console. Your abdomen will be filled with carbon dioxide gas. This allows your surgeon to view the area more clearly and allows more room for the surgery. Part of your colon will be removed through the incisions. The healthy colon tissue will be reattached with stitches (sutures) or surgical staples. The incisions will then be closed with sutures, skin glue, or adhesive strips. A bandage (dressing) may be placed over the incisions. The procedure may vary among health care providers and hospitals. What happens after the surgery? Your blood pressure, heart rate, breathing rate, and blood oxygen level will be monitored until you leave the hospital. You will receive fluids through an IV until your bowels start to work properly. You will be given medicines to control your pain and nausea, if needed. You may have to wear compression stockings. These stockings help to prevent blood clots and reduce swelling in your legs. You will start on a clear liquid diet. You will gradually advance to soft foods as you recover. You will be helped to get up and walk around as  soon as possible. Summary Minimally invasive partial colectomy is surgery to remove part of your large intestine (colon) because of cancer or another disease. You may be asked to take a medicine to empty your colon (bowel preparation). Follow instructions from your health care provider. Several small incisions will be made in your abdomen. Surgical instruments will be inserted into the small incisions. Part of your colon will be removed through the incisions. This information is not intended to replace advice given to you by your health care provider. Make sure you discuss any questions you have with your health care provider. Document Revised: 04/29/2021 Document Reviewed: 04/29/2021 Elsevier Patient Education  2024 [employer].

## 2022-09-09 ENCOUNTER — Other Ambulatory Visit: Payer: Self-pay

## 2022-09-09 ENCOUNTER — Telehealth: Payer: Self-pay | Admitting: Surgery

## 2022-09-09 MED ORDER — METRONIDAZOLE 500 MG PO TABS: ORAL_TABLET | ORAL | 0 refills | Status: DC

## 2022-09-09 MED ORDER — BISACODYL 5 MG PO TBEC: DELAYED_RELEASE_TABLET | ORAL | 0 refills | Status: DC

## 2022-09-09 MED ORDER — POLYETHYLENE GLYCOL 3350 17 GM/SCOOP PO POWD: ORAL | 0 refills | Status: DC

## 2022-09-09 MED ORDER — NEOMYCIN SULFATE 500 MG PO TABS: ORAL_TABLET | ORAL | 0 refills | Status: DC

## 2022-09-09 NOTE — Telephone Encounter (Signed)
Patient has been advised of Pre-Admission date/time, and Surgery date at [facility].  Surgery Date: 09/28/22 Preadmission Testing Date: 09/20/22 (phone 8a-1p)  Patient has been made aware to call [phone], between 1-3:00pm the day before surgery, to find out what time to arrive for surgery.    Blue surgery sheet is also mailed to the patient at his request.

## 2022-09-10 ENCOUNTER — Ambulatory Visit: Payer: No Typology Code available for payment source | Admitting: Family Medicine

## 2022-09-10 ENCOUNTER — Inpatient Hospital Stay: Payer: No Typology Code available for payment source

## 2022-09-10 ENCOUNTER — Ambulatory Visit: Admit: 2022-09-10 | Payer: No Typology Code available for payment source | Admitting: Neurosurgery

## 2022-09-10 ENCOUNTER — Encounter: Payer: Self-pay | Admitting: Surgery

## 2022-09-10 VITALS — BP 172/87 | HR 54 | Temp 97.6°F | Resp 18

## 2022-09-10 DIAGNOSIS — C9110 Chronic lymphocytic leukemia of B-cell type not having achieved remission: Secondary | ICD-10-CM

## 2022-09-10 SURGERY — ANTERIOR CERVICAL DECOMPRESSION/DISCECTOMY FUSION 2 LEVELS
Anesthesia: General

## 2022-09-10 MED ORDER — SODIUM CHLORIDE 0.9 % IV SOLN
Freq: Once | INTRAVENOUS | Status: AC
  Filled 2022-09-10: qty 250

## 2022-09-10 MED ORDER — SODIUM CHLORIDE 0.9 % IV SOLN
200.0000 mg | Freq: Once | INTRAVENOUS | Status: AC
  Administered 2022-09-10: 200 mg via INTRAVENOUS
  Filled 2022-09-10: qty 200

## 2022-09-10 NOTE — Progress Notes (Signed)
Outpatient Surgical Follow Up  09/10/2022  Paul Ana [name] is an 77 y.o. male.   Chief Complaint  Patient presents with   Routine Post Op    Irrigation and debridement left abscess upper thigh    HPI: Paul Briggs is a 77 year old male well-known to me with history of CL leukemia and chronic anemia and most recently hospitalized for a soft tissue infection of the upper left thigh.  I did perform I&D and he has improved significantly.  He is still taking antibiotics and he still has an open wound but overall feels better.  He also sees oncology due to his anemia and leukemia.  He also had a recent  Endoscopy that i personally reviewed Ulcerating partially obstructing hepatic flexure mass very concerning for carcinoma,pathology showed TA. I am concerned this biopsy understimates actual disease. It was tattoed. Other issues include, CKD, Anemia, DM   He denies abd pain, nml bms.  Past Medical History:  Diagnosis Date   Acute prostatitis    BPH (benign prostatic hyperplasia)    Chronic kidney disease    CLL (chronic lymphocytic leukemia) (HCC)    CLL (chronic lymphocytic leukemia) (HCC)    Diabetes (HCC)    Dysplasia of prostate    Erectile dysfunction    Grade I diastolic dysfunction    HTN (hypertension)    Hypogonadism in male    Left atrial dilation    Mild mitral regurgitation by prior echocardiogram    Orchitis and epididymitis    Over weight    Palindromic rheumatism, hand    Prostatitis    Rectum pain    Testicle swelling    Testicle tenderness    Testicular mass     Past Surgical History:  Procedure Laterality Date   BACK SURGERY     BIOPSY  08/21/2022   Procedure: BIOPSY;  Surgeon: Jaynie Collins, DO;  Location: [facility] ENDOSCOPY;  Service: Gastroenterology;;   BIOPSY  08/23/2022   Procedure: BIOPSY;  Surgeon: Jaynie Collins, DO;  Location: [facility] ENDOSCOPY;  Service: Gastroenterology;;   COLONOSCOPY WITH PROPOFOL N/A 08/23/2022   Procedure:  COLONOSCOPY WITH PROPOFOL;  Surgeon: Jaynie Collins, DO;  Location: [facility] ENDOSCOPY;  Service: Gastroenterology;  Laterality: N/A;   ESOPHAGOGASTRODUODENOSCOPY (EGD) WITH PROPOFOL N/A 08/21/2022   Procedure: ESOPHAGOGASTRODUODENOSCOPY (EGD) WITH PROPOFOL;  Surgeon: Jaynie Collins, DO;  Location: [facility] ENDOSCOPY;  Service: Gastroenterology;  Laterality: N/A;   GIVENS CAPSULE STUDY N/A 08/23/2022   Procedure: GIVENS CAPSULE STUDY;  Surgeon: Jaynie Collins, DO;  Location: [facility] ENDOSCOPY;  Service: Gastroenterology;  Laterality: N/A;   head surgery     from fall  drilled hole in brain to relieve pressure   IRRIGATION AND DEBRIDEMENT ABSCESS Left 08/28/2022   Procedure: IRRIGATION AND DEBRIDEMENT ABSCESS LEFT UPPER THIGH/GROIN;  Surgeon: Leafy Ro, MD;  Location: [facility] ORS;  Service: General;  Laterality: Left;   POLYPECTOMY  08/23/2022   Procedure: POLYPECTOMY;  Surgeon: Jaynie Collins, DO;  Location: [facility] ENDOSCOPY;  Service: Gastroenterology;;   SUBMUCOSAL TATTOO INJECTION  08/23/2022   Procedure: SUBMUCOSAL TATTOO INJECTION;  Surgeon: Jaynie Collins, DO;  Location: [facility] ENDOSCOPY;  Service: Gastroenterology;;   TONSILLECTOMY      Family History  Problem Relation Age of Onset   Cancer Maternal Aunt    Prostate cancer Neg Hx    Kidney disease Neg Hx    Kidney cancer Neg Hx    Bladder Cancer Neg Hx     Social History:  reports that he  has quit smoking. He has never used smokeless tobacco. He reports that he does not drink alcohol and does not use drugs.  Allergies: No Known Allergies  Medications reviewed.    ROS Full ROS performed and is otherwise negative other than what is stated in HPI   BP (!) 181/84   Pulse 67   Temp 97.6 F (36.4 C) (Oral)   Ht 6\' 1"  (1.854 m)   Wt 232 lb 3.2 oz (105.3 kg)   SpO2 99%   BMI 30.64 kg/m   Physical Exam Vitals and nursing note reviewed. Exam conducted with a chaperone present.  Cardiovascular:     Rate  and Rhythm: Normal rate.     Heart sounds: No murmur heard. Pulmonary:     Effort: Pulmonary effort is normal. No respiratory distress.     Breath sounds: No stridor. No wheezing.  Abdominal:     General: Abdomen is flat. There is no distension.     Palpations: Abdomen is soft. There is no mass.     Tenderness: There is no abdominal tenderness. There is no guarding.     Hernia: A hernia is present.     Comments: Umbilical hernia 2 cms  Musculoskeletal:        General: Normal range of motion.     Cervical back: Normal range of motion and neck supple. No rigidity or tenderness.  Lymphadenopathy:     Cervical: No cervical adenopathy.  Skin:    General: Skin is warm.     Capillary Refill: Capillary refill takes less than 2 seconds.     Coloration: Skin is not jaundiced.     Comments: Open wound left upper thigh and left groin, helaing well. Some induration but significant improvement in cellulitis and soft tissue infection, no evidence of drainable collections  Neurological:     General: No focal deficit present.     Mental Status: He is oriented to person, place, and time.  Psychiatric:        Mood and Affect: Mood normal.        Behavior: Behavior normal.        Thought Content: Thought content normal.        Judgment: Judgment normal.    Assessment/Plan: 77 year old male with multiple comorbidities now healing from left thigh infection.  For now continue current wound care and completion of antibiotic therapy.  Most importantly he is here for another issue that is an unresectable hepatic flexure near obstructing mass.  Although pathology does not show carcinoma endoscopic findings are very concerning.  Given the endoscopic findings I do recommend that is prudent to treat this as cancer.  I do recommend a right colectomy.  I had a discussion with the patient in detail regarding his disease process.  I do think that we can do this minimally invasive with a hand-assisted.  We will plan to  perform hand-assisted right colectomy in a few weeks.  We will be able to fix ventral defect as well. Will make sure that he heals from the wound and his infection is well-controlled.  Procedure discussed with the patient in detail.  Risk, benefits and possible implications including but not limited to, bleeding, infection anastomotic leak, the need for ostomy.  He understands and is in agreement.  Please note that I spent greater than 40 minutes in this encounter including personally reviewing imaging studies, coordinating his care, placing orders and performing documentation.  Please note that this encounter was only related to recent procedure  as this is counter mainly focused on the unresectable colon mass.  Sterling Big, MD [facility] General Surgeon

## 2022-09-13 ENCOUNTER — Telehealth: Payer: Self-pay

## 2022-09-13 NOTE — Telephone Encounter (Signed)
Faxed medical clearance to Debbra Riding, PA-C at [phone].

## 2022-09-14 ENCOUNTER — Inpatient Hospital Stay: Payer: No Typology Code available for payment source

## 2022-09-14 VITALS — BP 171/77 | HR 52 | Temp 98.6°F | Resp 16

## 2022-09-14 DIAGNOSIS — C9110 Chronic lymphocytic leukemia of B-cell type not having achieved remission: Secondary | ICD-10-CM

## 2022-09-14 LAB — CBC WITH DIFFERENTIAL (CANCER CENTER ONLY)
Abs Immature Granulocytes: 0.04 10*3/uL (ref 0.00–0.07)
Basophils Absolute: 0.1 10*3/uL (ref 0.0–0.1)
Basophils Relative: 1 %
Eosinophils Absolute: 0.2 10*3/uL (ref 0.0–0.5)
Eosinophils Relative: 3 %
HCT: 27.7 % — ABNORMAL LOW (ref 39.0–52.0)
Hemoglobin: 8.4 g/dL — ABNORMAL LOW (ref 13.0–17.0)
Immature Granulocytes: 1 %
Lymphocytes Relative: 39 %
Lymphs Abs: 2.8 10*3/uL (ref 0.7–4.0)
MCH: 28.8 pg (ref 26.0–34.0)
MCHC: 30.3 g/dL (ref 30.0–36.0)
MCV: 94.9 fL (ref 80.0–100.0)
Monocytes Absolute: 1.2 10*3/uL — ABNORMAL HIGH (ref 0.1–1.0)
Monocytes Relative: 16 %
Neutro Abs: 2.8 10*3/uL (ref 1.7–7.7)
Neutrophils Relative %: 40 %
Platelet Count: 247 10*3/uL (ref 150–400)
RBC: 2.92 MIL/uL — ABNORMAL LOW (ref 4.22–5.81)
RDW: 22.8 % — ABNORMAL HIGH (ref 11.5–15.5)
Smear Review: NORMAL
WBC Count: 7.1 10*3/uL (ref 4.0–10.5)
nRBC: 0 % (ref 0.0–0.2)

## 2022-09-14 LAB — SAMPLE TO BLOOD BANK

## 2022-09-14 MED ORDER — SODIUM CHLORIDE 0.9 % IV SOLN
200.0000 mg | Freq: Once | INTRAVENOUS | Status: AC
  Administered 2022-09-14: 200 mg via INTRAVENOUS
  Filled 2022-09-14: qty 200

## 2022-09-14 NOTE — Patient Instructions (Signed)
 Iron Sucrose Injection What is this medication? IRON SUCROSE (EYE ern SOO krose) treats low levels of iron (iron deficiency anemia) in people with kidney disease. Iron is a mineral that plays an important role in making red blood cells, which carry oxygen from your lungs to the rest of your body. This medicine may be used for other purposes; ask your health care provider or pharmacist if you have questions. COMMON BRAND NAME(S): Venofer What should I tell my care team before I take this medication? They need to know if you have any of these conditions: Anemia not caused by low iron levels Heart disease High levels of iron in the blood Kidney disease Liver disease An unusual or allergic reaction to iron, other medications, foods, dyes, or preservatives Pregnant or trying to get pregnant Breastfeeding How should I use this medication? This medication is for infusion into a vein. It is given in a hospital or clinic setting. Talk to your care team about the use of this medication in children. While this medication may be prescribed for children as young as 2 years for selected conditions, precautions do apply. Overdosage: If you think you have taken too much of this medicine contact a poison control center or emergency room at once. NOTE: This medicine is only for you. Do not share this medicine with others. What if I miss a dose? Keep appointments for follow-up doses. It is important not to miss your dose. Call your care team if you are unable to keep an appointment. What may interact with this medication? Do not take this medication with any of the following: Deferoxamine Dimercaprol Other iron products This medication may also interact with the following: Chloramphenicol Deferasirox This list may not describe all possible interactions. Give your health care provider a list of all the medicines, herbs, non-prescription drugs, or dietary supplements you use. Also tell them if you smoke,  drink alcohol, or use illegal drugs. Some items may interact with your medicine. What should I watch for while using this medication? Visit your care team regularly. Tell your care team if your symptoms do not start to get better or if they get worse. You may need blood work done while you are taking this medication. You may need to follow a special diet. Talk to your care team. Foods that contain iron include: whole grains/cereals, dried fruits, beans, or peas, leafy green vegetables, and organ meats (liver, kidney). What side effects may I notice from receiving this medication? Side effects that you should report to your care team as soon as possible: Allergic reactions--skin rash, itching, hives, swelling of the face, lips, tongue, or throat Low blood pressure--dizziness, feeling faint or lightheaded, blurry vision Shortness of breath Side effects that usually do not require medical attention (report to your care team if they continue or are bothersome): Flushing Headache Joint pain Muscle pain Nausea Pain, redness, or irritation at injection site This list may not describe all possible side effects. Call your doctor for medical advice about side effects. You may report side effects to FDA at 1-800-FDA-1088. Where should I keep my medication? This medication is given in a hospital or clinic. It will not be stored at home. NOTE: This sheet is a summary. It may not cover all possible information. If you have questions about this medicine, talk to your doctor, pharmacist, or health care provider.  2024 Elsevier/Gold Standard (2022-06-18 00:00:00)

## 2022-09-17 ENCOUNTER — Other Ambulatory Visit: Payer: No Typology Code available for payment source

## 2022-09-17 ENCOUNTER — Inpatient Hospital Stay: Payer: No Typology Code available for payment source

## 2022-09-17 ENCOUNTER — Ambulatory Visit: Payer: No Typology Code available for payment source | Admitting: Physician Assistant

## 2022-09-17 ENCOUNTER — Encounter: Payer: Self-pay | Admitting: Medical

## 2022-09-17 ENCOUNTER — Ambulatory Visit: Payer: No Typology Code available for payment source | Attending: Medical | Admitting: Medical

## 2022-09-17 VITALS — BP 160/90 | HR 54 | Ht 73.0 in | Wt 222.0 lb

## 2022-09-17 VITALS — BP 167/75 | HR 47 | Temp 96.0°F | Resp 16

## 2022-09-17 DIAGNOSIS — I2510 Atherosclerotic heart disease of native coronary artery without angina pectoris: Secondary | ICD-10-CM | POA: Diagnosis not present

## 2022-09-17 DIAGNOSIS — Z01810 Encounter for preprocedural cardiovascular examination: Secondary | ICD-10-CM | POA: Diagnosis not present

## 2022-09-17 DIAGNOSIS — C9110 Chronic lymphocytic leukemia of B-cell type not having achieved remission: Secondary | ICD-10-CM | POA: Diagnosis not present

## 2022-09-17 DIAGNOSIS — I5022 Chronic systolic (congestive) heart failure: Secondary | ICD-10-CM

## 2022-09-17 DIAGNOSIS — E782 Mixed hyperlipidemia: Secondary | ICD-10-CM

## 2022-09-17 DIAGNOSIS — I10 Essential (primary) hypertension: Secondary | ICD-10-CM

## 2022-09-17 MED ORDER — SODIUM CHLORIDE 0.9 % IV SOLN
200.0000 mg | Freq: Once | INTRAVENOUS | Status: AC
  Administered 2022-09-17: 200 mg via INTRAVENOUS
  Filled 2022-09-17: qty 200

## 2022-09-17 MED ORDER — ENTRESTO 24-26 MG PO TABS: 1.0000 | ORAL_TABLET | Freq: Two times a day (BID) | ORAL | 3 refills | Status: DC

## 2022-09-17 NOTE — Patient Instructions (Addendum)
Medication Instructions:  Your physician has recommended you make the following change in your medication:   START - sacubitril-valsartan (ENTRESTO) 24-26 MG - Take 1 tablet by mouth 2 (two) times daily  STOP - valsartan (DIOVAN) 160 MG tablet  *If you need a refill on your cardiac medications before your next appointment, please call your pharmacy*  Lab Work: Your physician recommends that you return for lab work in 2 weeks: BMET  If you have labs (blood work) drawn today and your tests are completely normal, you will receive your results only by: [employer] (if you have MyChart) OR A paper copy in the mail If you have any lab test that is abnormal or we need to change your treatment, we will call you to review the results.  Testing/Procedures: -None ordered  Follow-Up: At [facility], you and your health needs are our priority.  As part of our continuing mission to provide you with exceptional heart care, we have created designated Provider Care Teams.  These Care Teams include your primary Cardiologist (physician) and Advanced Practice Providers (APPs -  Physician Assistants and Nurse Practitioners) who all work together to provide you with the care you need, when you need it.  Your next appointment:   1 month(s)  Provider:   Cadence Fransico Michael, PA-C    Other Instructions - Schedule MR CARDIAC MORPHOLOGY W WO CONTRAST

## 2022-09-17 NOTE — Progress Notes (Signed)
Cardiology Office Note:    Date:  09/17/2022   ID:  Paul Ana [name], DOB May 17, 1945, MRN [id number]  PCP:  Paul Corner, PA-C  [facility] Cardiologist:  Debbe Odea, MD  [facility] Electrophysiologist:  None   Referring MD: Paul Briggs,*   Chief Complaint: hospital follow-up  History of Present Illness:    Paul Lubell [name] is a 77 y.o. male with a hx of  HTN, diabetes, CLL s/p chemo, CKD stag 3, IDA who presents for follow-up.    The patient was initially seen 05/2022 for HTN and he was started on Diovan 160mg  daily. EKG showed LBBB and an echo was ordered.    Echo showed LVEF 45-50%, no WMA, moderate to severe LVH with speckled appearance, consider for amyloidosis, severely dilated left atrium, mildly dilated right atrium, small pericardial effusion with no tamponade, mild MR. Plan was for cardiac CTA and cardiac MRI.  Cardiac CTA showed coronary calcium score of 14.3, 12th percentile for age and sex matched control, minimal proximal LAD stenosis, overall minimal nonobstructive CAD.  Patient was admitted August 26, 2022 with a mechanical fall fall, cellulitis of the left groin/SIRS, and mildly elevated troponin. He was briefly seen by cardiology and felt to be stable. No inpatient cardiac work-up was pursued.   Today, patient is overall doing well.  He does feel weak since being discharged from the hospital.  He denies any further presyncope or falls.  Blood pressure is elevated today.  He denies any chest pain or significant shortness of breath.  No lower leg edema on exam.  He has not gotten the cardiac MRI done.  Patient is to undergo laparoscopic right colectomy on September 28, 2022.  Past Medical History:  Diagnosis Date   Acute prostatitis    BPH (benign prostatic hyperplasia)    Chronic kidney disease    CLL (chronic lymphocytic leukemia) (HCC)    CLL (chronic lymphocytic leukemia) (HCC)    Diabetes (HCC)    Dysplasia of  prostate    Erectile dysfunction    Grade I diastolic dysfunction    HTN (hypertension)    Hypogonadism in male    Left atrial dilation    Mild mitral regurgitation by prior echocardiogram    Orchitis and epididymitis    Over weight    Palindromic rheumatism, hand    Prostatitis    Rectum pain    Testicle swelling    Testicle tenderness    Testicular mass     Past Surgical History:  Procedure Laterality Date   BACK SURGERY     BIOPSY  08/21/2022   Procedure: BIOPSY;  Surgeon: Jaynie Collins, DO;  Location: [facility] ENDOSCOPY;  Service: Gastroenterology;;   BIOPSY  08/23/2022   Procedure: BIOPSY;  Surgeon: Jaynie Collins, DO;  Location: [facility] ENDOSCOPY;  Service: Gastroenterology;;   COLONOSCOPY WITH PROPOFOL N/A 08/23/2022   Procedure: COLONOSCOPY WITH PROPOFOL;  Surgeon: Jaynie Collins, DO;  Location: [facility] ENDOSCOPY;  Service: Gastroenterology;  Laterality: N/A;   ESOPHAGOGASTRODUODENOSCOPY (EGD) WITH PROPOFOL N/A 08/21/2022   Procedure: ESOPHAGOGASTRODUODENOSCOPY (EGD) WITH PROPOFOL;  Surgeon: Jaynie Collins, DO;  Location: [facility] ENDOSCOPY;  Service: Gastroenterology;  Laterality: N/A;   GIVENS CAPSULE STUDY N/A 08/23/2022   Procedure: GIVENS CAPSULE STUDY;  Surgeon: Jaynie Collins, DO;  Location: [facility] ENDOSCOPY;  Service: Gastroenterology;  Laterality: N/A;   head surgery     from fall  drilled hole in brain to relieve pressure   IRRIGATION AND DEBRIDEMENT ABSCESS  Left 08/28/2022   Procedure: IRRIGATION AND DEBRIDEMENT ABSCESS LEFT UPPER THIGH/GROIN;  Surgeon: Leafy Ro, MD;  Location: [facility] ORS;  Service: General;  Laterality: Left;   POLYPECTOMY  08/23/2022   Procedure: POLYPECTOMY;  Surgeon: Jaynie Collins, DO;  Location: [facility] ENDOSCOPY;  Service: Gastroenterology;;   SUBMUCOSAL TATTOO INJECTION  08/23/2022   Procedure: SUBMUCOSAL TATTOO INJECTION;  Surgeon: Jaynie Collins, DO;  Location: [facility] ENDOSCOPY;  Service: Gastroenterology;;    TONSILLECTOMY      Current Medications: Current Meds  Medication Sig   aspirin EC 81 MG tablet Take 1 tablet (81 mg total) by mouth daily. Swallow whole.   bisacodyl (DULCOLAX) 5 MG EC tablet Take all 4 tabs at 8 am the morning prior to surgery.   dapagliflozin propanediol (FARXIGA) 10 MG TABS tablet Take 1 tablet (10 mg total) by mouth daily before breakfast.   hydrALAZINE (APRESOLINE) 50 MG tablet Take 1 tablet (50 mg total) by mouth in the morning and at bedtime.   levothyroxine (SYNTHROID) 200 MCG tablet Take 200 mcg by mouth daily.   metFORMIN (GLUCOPHAGE) 500 MG tablet Take 500 mg by mouth 2 (two) times daily with a meal.   metoprolol succinate (TOPROL XL) 25 MG 24 hr tablet Take 1 tablet (25 mg total) by mouth daily.   metroNIDAZOLE (FLAGYL) 500 MG tablet Take 2 tabs (1000 mg total) at 8 am, again at 2 pm, and again at 8 pm the day prior to surgery.   neomycin (MYCIFRADIN) 500 MG tablet Take 2 tabs (1000 mg total) at 8 am, again at 2 pm, and again at 8 pm the day prior to surgery.   oxyCODONE-acetaminophen (PERCOCET) 10-325 MG tablet Take 1 tablet by mouth every 8 (eight) hours as needed.   [START ON 09/22/2022] oxyCODONE-acetaminophen (PERCOCET) 10-325 MG tablet Take 1 tablet by mouth every 8 (eight) hours as needed.   [START ON 10/22/2022] oxyCODONE-acetaminophen (PERCOCET) 10-325 MG tablet Take 1 tablet by mouth every 8 (eight) hours as needed.   polyethylene glycol powder (MIRALAX) 17 GM/SCOOP powder Mix with 64 ounces of Gatorade (no red) the day prior to surgery and drink as directed.   pregabalin (LYRICA) 100 MG capsule Take 1 capsule (100 mg total) by mouth 2 (two) times daily.   rosuvastatin (CRESTOR) 40 MG tablet Take 1 tablet (40 mg total) by mouth daily.   sacubitril-valsartan (ENTRESTO) 24-26 MG Take 1 tablet by mouth 2 (two) times daily.   zanubrutinib (BRUKINSA) 80 MG capsule Take 2 capsules (160 mg total) by mouth 2 (two) times daily.   [DISCONTINUED] valsartan (DIOVAN)  160 MG tablet Take 160 mg by mouth daily.     Allergies:   Patient has no known allergies.   Social History   Socioeconomic History   Marital status: Married    Spouse name: Not on file   Number of children: Not on file   Years of education: Not on file   Highest education level: 5th grade  Occupational History   Not on file  Tobacco Use   Smoking status: Former   Smokeless tobacco: Never   Tobacco comments:    quit 30 years ago  Vaping Use   Vaping status: Never Used  Substance and Sexual Activity   Alcohol use: No    Alcohol/week: 0.0 standard drinks of alcohol   Drug use: No   Sexual activity: Not on file  Other Topics Concern   Not on file  Social History Narrative   Not on file  Social Determinants of Health   Financial Resource Strain: Medium Risk (07/26/2022)   Overall Financial Resource Strain (CARDIA)    Difficulty of Paying Living Expenses: Somewhat hard  Food Insecurity: No Food Insecurity (08/26/2022)   Hunger Vital Sign    Worried About Running Out of Food in the Last Year: Never true    Ran Out of Food in the Last Year: Never true  Recent Concern: Food Insecurity - Food Insecurity Present (07/26/2022)   Hunger Vital Sign    Worried About Running Out of Food in the Last Year: Sometimes true    Ran Out of Food in the Last Year: Never true  Transportation Needs: No Transportation Needs (08/26/2022)   PRAPARE - Administrator, Civil Service (Medical): No    Lack of Transportation (Non-Medical): No  Physical Activity: Unknown (07/26/2022)   Exercise Vital Sign    Days of Exercise per Week: Patient declined    Minutes of Exercise per Session: 0 min  Stress: Stress Concern Present (07/26/2022)   [employer] of Occupational Health - Occupational Stress Questionnaire    Feeling of Stress : To some extent  Social Connections: Moderately Integrated (07/26/2022)   Social Connection and Isolation Panel [NHANES]    Frequency of Communication with Friends  and Family: More than three times a week    Frequency of Social Gatherings with Friends and Family: Patient declined    Attends Religious Services: More than 4 times per year    Active Member of [employer] or Organizations: No    Attends Banker Meetings: Never    Marital Status: Married     Family History: The patient's family history includes Cancer in his maternal aunt. There is no history of Prostate cancer, Kidney disease, Kidney cancer, or Bladder Cancer.  ROS:   Please see the history of present illness.     All other systems reviewed and are negative.  EKGs/Labs/Other Studies Reviewed:    The following studies were reviewed today:  Cardiac CTA 08/2022  IMPRESSION: 1. Coronary calcium score of 14.3. This was 12th percentile for age and sex matched control.   2. Normal coronary origin with right dominance.   3. Minimal proximal LAD stenosis (<25%).   4. CAD-RADS 1. Minimal non-obstructive CAD (0-24%). Consider preventive therapy and risk factor modification.   Electronically Signed: By: Debbe Odea M.D. On: 08/26/2022 13:58    Echo 07/2022 1. Left ventricular ejection fraction, by estimation, is 45 to 50%. Left  ventricular ejection fraction by 3D volume is 47 %. The left ventricle has  mildly decreased function. The left ventricle has no regional wall motion  abnormalities. There is  moderate to severe concentric left ventricular hypertrophy, speckled  appearance. Left ventricular diastolic parameters are consistent with  Grade I diastolic dysfunction (impaired relaxation). The average left  ventricular global longitudinal strain is  -18.8 %. Consider w/u for [facility] if clinically indicated.   2. Right ventricular systolic function is normal. The right ventricular  size is normal. Tricuspid regurgitation signal is inadequate for assessing  PA pressure.   3. Left atrial size was severely dilated.   4. Right atrial size was mildly dilated.   5. A  small pericardial effusion is present. There is no evidence of  cardiac tamponade.   6. The mitral valve is normal in structure. Mild mitral valve  regurgitation. No evidence of mitral stenosis.   7. The aortic valve is tricuspid. Aortic valve regurgitation is not  visualized. Aortic valve  sclerosis/calcification is present, without any  evidence of aortic stenosis.   8. The inferior vena cava is normal in size with greater than 50%  respiratory variability, suggesting right atrial pressure of 3 mmHg.   EKG:  EKG is not ordered today.    Recent Labs: 09/03/2022: ALT 24; BUN 13; Creatinine 1.21; Potassium 3.9; Sodium 139 09/14/2022: Hemoglobin 8.4; Platelet Count 247  Recent Lipid Panel    Component Value Date/Time   CHOL 181 07/06/2021 1030   TRIG 60 07/06/2021 1030   HDL 61 07/06/2021 1030   CHOLHDL 3.0 07/06/2021 1030   LDLCALC 105 (H) 07/06/2021 1030     Physical Exam:    VS:  BP (!) 160/90   Pulse (!) 54   Ht 6\' 1"  (1.854 m)   Wt 222 lb (100.7 kg)   SpO2 100%   BMI 29.29 kg/m     Wt Readings from Last 3 Encounters:  09/17/22 222 lb (100.7 kg)  09/08/22 232 lb 3.2 oz (105.3 kg)  09/03/22 240 lb 11.2 oz (109.2 kg)     GEN:  Well nourished, well developed in no acute distress HEENT: Normal NECK: No JVD; No carotid bruits LYMPHATICS: No lymphadenopathy CARDIAC: RRR, no murmurs, rubs, gallops RESPIRATORY:  Clear to auscultation without rales, wheezing or rhonchi  ABDOMEN: Soft, non-tender, non-distended MUSCULOSKELETAL:  No edema; No deformity  SKIN: Warm and dry NEUROLOGIC:  Alert and oriented x 3 PSYCHIATRIC:  Normal affect   ASSESSMENT:    1. Heart failure with mildly reduced ejection fraction (HFmrEF) (HCC)   2. Essential hypertension   3. Coronary artery disease involving native coronary artery of native heart without angina pectoris   4. Hyperlipidemia, mixed   5. 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  Cardiac CTA showed minimal nonobstructive CAD.  Plan for cardiac MRI.  Patient is euvolemic on exam.  Patient is taking Valsartan, Toprol and [place].  I will stop valsartan and add on Entresto 24-26 mg twice daily. Check BMET today and in 2 weeks.  Continue Toprol 25 mg daily, Hydralazine 50mg  BID and Farxiga 10 mg daily.  We will schedule cardiac MRI.  Continue guideline directed medical therapy at follow-up.  HTN Blood pressure elevated today, stop Valsartan and start Entresto as above.  Continue Hydralazine, Toprol and Farxiga.  Nonobstructive CAD Recent cardiac CTA showed nonobstructive CAD.  Patient denies chest pain.  Continue aspirin 81 mg daily, Crestor 40 mg daily.  HLD LDL 110. Continue Crestor 40mg  daily.  Can recheck lipid panel and LFTs at follow-up.  Pre-op cardiac evaluation Patient is to undergo a laparoscopic right colectomy on September 28, 2022.  Patient is overall stable from a cardiac perspective.  Medication changes as above with plan for cardiac MRI.  METs greater than 4.  No further cardiac workup prior to surgery. According to the RCRI the patient is moderate risk, 10.1%, 30 day risk of death, MI or cardiac arrest. Monitor volume status per-operative period.   Regarding ASA therapy, we recommend continuation of ASA throughout the perioperative period.  However, if the surgeon feels that cessation of ASA is required in the perioperative period, it may be stopped 5-7 days prior to surgery with a plan to resume it as soon as felt to be feasible from a surgical standpoint in the  post-operative period.   Disposition: Follow up in 1 month(s) with MD/APP     Signed, Analese Sovine David Stall, PA-C  09/17/2022 8:50 AM    [facility]

## 2022-09-17 NOTE — Progress Notes (Signed)
Declined 30 minute post observation. Aware of risks. Vitals stable at discharge.  

## 2022-09-20 ENCOUNTER — Encounter
Admission: RE | Admit: 2022-09-20 | Discharge: 2022-09-20 | Disposition: A | Discharge status: Home / Self Care | Payer: No Typology Code available for payment source | Source: Ambulatory Visit | Attending: Surgery | Admitting: Surgery

## 2022-09-20 DIAGNOSIS — Z01818 Encounter for other preprocedural examination: Secondary | ICD-10-CM

## 2022-09-20 DIAGNOSIS — Z01812 Encounter for preprocedural laboratory examination: Secondary | ICD-10-CM

## 2022-09-20 HISTORY — DX: Obstructive sleep apnea (adult) (pediatric): G47.33

## 2022-09-20 HISTORY — DX: Iron deficiency anemia, unspecified: D50.9

## 2022-09-20 HISTORY — DX: Other pericardial effusion (noninflammatory): I31.39

## 2022-09-20 HISTORY — DX: Type 2 diabetes mellitus without complications: E11.9

## 2022-09-20 HISTORY — DX: Cardiomegaly: I51.7

## 2022-09-20 HISTORY — DX: Repeated falls: R29.6

## 2022-09-20 HISTORY — DX: Hyperlipidemia, unspecified: E78.5

## 2022-09-20 HISTORY — DX: Heart failure, unspecified: I50.9

## 2022-09-20 HISTORY — DX: Chronic kidney disease, stage 3 unspecified: N18.30

## 2022-09-20 HISTORY — DX: Left bundle-branch block, unspecified: I44.7

## 2022-09-20 NOTE — Patient Instructions (Addendum)
Your procedure is scheduled on:09-28-22 Tuesday Report to the Registration Desk on the 1st floor of the [facility].Then proceed to the 2nd floor Surgery Desk To find out your arrival time, please call [phone] between 1PM - 3PM on:09-24-22 Friday If your arrival time is 6:00 am, do not arrive before that time as the [facility] entrance doors do not open until 6:00 am.  REMEMBER: Instructions that are not followed completely may result in serious medical risk, up to and including death; or upon the discretion of your surgeon and anesthesiologist your surgery may need to be rescheduled.  Do not eat food after midnight the night before surgery.  No gum chewing or hard candies.  You may however, drink Water up to 2 hours before you are scheduled to arrive for your surgery. Do not drink anything within 2 hours of your scheduled arrival time.  One week prior to surgery:Last dose 09-20-22 Stop Anti-inflammatories (NSAIDS) such as Advil, Aleve, Ibuprofen, Motrin, Naproxen, Naprosyn and Aspirin based products such as Excedrin, Goody's Powder, BC Powder.You may however, take Tylenol/Percocet if needed for pain up until the day of surgery. Stop ANY OVER THE COUNTER supplements/vitamins NOW (09-20-22) until after surgery.   Continue taking all prescribed medications with the exception of the following: --Aspirin-Stop 5 days prior to surgery-Last dose will be on 09-22-22 Wednesday -dapagliflozin propanediol (FARXIGA)-Stop 3 days prior to surgery-Last dose will be on 09-24-22 Friday -metFORMIN (GLUCOPHAGE)-Stop 2 days prior to surgery-Last dose will be on 09-25-22 Saturday  TAKE ONLY THESE MEDICATIONS THE MORNING OF SURGERY WITH A SIP OF WATER: -hydrALAZINE (APRESOLINE)  -levothyroxine (SYNTHROID)  -metoprolol succinate (TOPROL XL)  -rosuvastatin (CRESTOR)   Follow Dr Hurman Horn Bowel prep as instructed to you in his office  No Alcohol for 24 hours before or after surgery.  No Smoking including  e-cigarettes for 24 hours before surgery.  No chewable tobacco products for at least 6 hours before surgery.  No nicotine patches on the day of surgery.  Do not use any "recreational" drugs for at least a week (preferably 2 weeks) before your surgery.  Please be advised that the combination of cocaine and anesthesia may have negative outcomes, up to and including death. If you test positive for cocaine, your surgery will be cancelled.  On the morning of surgery brush your teeth with toothpaste and water, you may rinse your mouth with mouthwash if you wish. Do not swallow any toothpaste or mouthwash.  Use CHG wipes as directed on instruction sheet.  Do not wear jewelry, make-up, hairpins, clips or nail polish.  Do not wear lotions, powders, or perfumes.   Do not shave body hair from the neck down 48 hours before surgery.  Contact lenses, hearing aids and dentures may not be worn into surgery.  Do not bring valuables to the hospital. [facility] is not responsible for any missing/lost belongings or valuables.   Notify your doctor if there is any change in your medical condition (cold, fever, infection).  Wear comfortable clothing (specific to your surgery type) to the hospital.  After surgery, you can help prevent lung complications by doing breathing exercises.  Take deep breaths and cough every 1-2 hours. Your doctor may order a device called an Incentive Spirometer to help you take deep breaths. When coughing or sneezing, hold a pillow firmly against your incision with both hands. This is called "splinting." Doing this helps protect your incision. It also decreases belly discomfort.  If you are being admitted to the hospital  overnight, leave your suitcase in the car. After surgery it may be brought to your room.  In case of increased patient census, it may be necessary for you, the patient, to continue your postoperative care in the Same Day Surgery department.  If you are being  discharged the day of surgery, you will not be allowed to drive home. You will need a responsible individual to drive you home and stay with you for 24 hours after surgery.   If you are taking public transportation, you will need to have a responsible individual with you.  Please call the Pre-admissions Testing Dept. at [phone] if you have any questions about these instructions.  Surgery Visitation Policy:  Patients having surgery or a procedure may have two visitors.  Children under the age of 81 must have an adult with them who is not the patient.  Inpatient Visitation:    Visiting hours are 7 a.m. to 8 p.m. Up to four visitors are allowed at one time in a patient room. The visitors may rotate out with other people during the day.  One visitor age 74 or older may stay with the patient overnight and must be in the room by 8 p.m.  Preparing the Skin Before Surgery     To help prevent the risk of infection at your surgical site, we are now providing you with rinse-free Sage 2% Chlorhexidine Gluconate (CHG) disposable wipes.  Chlorhexidine Gluconate (CHG) Soap  o An antiseptic cleaner that kills germs and bonds with the skin to continue killing germs even after washing  o Used for showering the night before surgery and morning of surgery  The night before surgery: Shower or bathe with warm water. Do not apply perfume, lotions, powders. Wait one hour after shower. Skin should be dry and cool. Open Sage wipe package - use 6 disposable cloths. Wipe body using one cloth for the right arm, one cloth for the left arm, one cloth for the right leg, one cloth for the left leg, one cloth for the chest/abdomen area, and one cloth for the back. Do not use on open wounds or sores. Do not use on face or genitals (private parts). If you are breast feeding, do not use on breasts. 5. Do not rinse, allow to dry. 6. Skin may feel "tacky" for several minutes. 7. Dress in clean clothes. 8.  Place clean sheets on your bed and do not sleep with pets.  REPEAT ABOVE ON THE MORNING OF SURGERY BEFORE ARRIVING TO THE HOSPITAL.  How to Use an Incentive Spirometer An incentive spirometer is a tool that measures how well you are filling your lungs with each breath. Learning to take long, deep breaths using this tool can help you keep your lungs clear and active. This may help to reverse or lessen your chance of developing breathing (pulmonary) problems, especially infection. You may be asked to use a spirometer: After a surgery. If you have a lung problem or a history of smoking. After a long period of time when you have been unable to move or be active. If the spirometer includes an indicator to show the highest number that you have reached, your health care provider or respiratory therapist will help you set a goal. Keep a log of your progress as told by your health care provider. What are the risks? Breathing too quickly may cause dizziness or cause you to pass out. Take your time so you do not get dizzy or light-headed. If you are  in pain, you may need to take pain medicine before doing incentive spirometry. It is harder to take a deep breath if you are having pain. How to use your incentive spirometer  Sit up on the edge of your bed or on a chair. Hold the incentive spirometer so that it is in an upright position. Before you use the spirometer, breathe out normally. Place the mouthpiece in your mouth. Make sure your lips are closed tightly around it. Breathe in slowly and as deeply as you can through your mouth, causing the piston or the ball to rise toward the top of the chamber. Hold your breath for 3-5 seconds, or for as long as possible. If the spirometer includes a coach indicator, use this to guide you in breathing. Slow down your breathing if the indicator goes above the marked areas. Remove the mouthpiece from your mouth and breathe out normally. The piston or ball will return  to the bottom of the chamber. Rest for a few seconds, then repeat the steps 10 or more times. Take your time and take a few normal breaths between deep breaths so that you do not get dizzy or light-headed. Do this every 1-2 hours when you are awake. If the spirometer includes a goal marker to show the highest number you have reached (best effort), use this as a goal to work toward during each repetition. After each set of 10 deep breaths, cough a few times. This will help to make sure that your lungs are clear. If you have an incision on your chest or abdomen from surgery, place a pillow or a rolled-up towel firmly against the incision when you cough. This can help to reduce pain while taking deep breaths and coughing. General tips When you are able to get out of bed: Walk around often. Continue to take deep breaths and cough in order to clear your lungs. Keep using the incentive spirometer until your health care provider says it is okay to stop using it. If you have been in the hospital, you may be told to keep using the spirometer at home. Contact a health care provider if: You are having difficulty using the spirometer. You have trouble using the spirometer as often as instructed. Your pain medicine is not giving enough relief for you to use the spirometer as told. You have a fever. Get help right away if: You develop shortness of breath. You develop a cough with bloody mucus from the lungs. You have fluid or blood coming from an incision site after you cough. Summary An incentive spirometer is a tool that can help you learn to take long, deep breaths to keep your lungs clear and active. You may be asked to use a spirometer after a surgery, if you have a lung problem or a history of smoking, or if you have been inactive for a long period of time. Use your incentive spirometer as instructed every 1-2 hours while you are awake. If you have an incision on your chest or abdomen, place a  pillow or a rolled-up towel firmly against your incision when you cough. This will help to reduce pain. Get help right away if you have shortness of breath, you cough up bloody mucus, or blood comes from your incision when you cough. This information is not intended to replace advice given to you by your health care provider. Make sure you discuss any questions you have with your health care provider. Document Revised: 04/02/2019 Document Reviewed: 04/02/2019 Elsevier Patient  Education  2024 [employer].

## 2022-09-21 ENCOUNTER — Encounter: Payer: Self-pay | Admitting: Surgery

## 2022-09-21 ENCOUNTER — Encounter
Admission: RE | Admit: 2022-09-21 | Discharge: 2022-09-21 | Disposition: A | Discharge status: Home / Self Care | Payer: No Typology Code available for payment source | Source: Ambulatory Visit | Attending: Surgery | Admitting: Surgery

## 2022-09-21 ENCOUNTER — Inpatient Hospital Stay: Payer: No Typology Code available for payment source

## 2022-09-21 DIAGNOSIS — Z01818 Encounter for other preprocedural examination: Secondary | ICD-10-CM | POA: Diagnosis present

## 2022-09-21 DIAGNOSIS — C9110 Chronic lymphocytic leukemia of B-cell type not having achieved remission: Secondary | ICD-10-CM

## 2022-09-21 DIAGNOSIS — Z01812 Encounter for preprocedural laboratory examination: Secondary | ICD-10-CM | POA: Diagnosis not present

## 2022-09-21 DIAGNOSIS — K6389 Other specified diseases of intestine: Secondary | ICD-10-CM | POA: Diagnosis not present

## 2022-09-21 LAB — RETIC PANEL
Immature Retic Fract: 21.8 % — ABNORMAL HIGH (ref 2.3–15.9)
RBC.: 3.36 MIL/uL — ABNORMAL LOW (ref 4.22–5.81)
Retic Count, Absolute: 94.8 10*3/uL (ref 19.0–186.0)
Retic Ct Pct: 2.8 % (ref 0.4–3.1)
Reticulocyte Hemoglobin: 31.4 pg (ref >27.9)

## 2022-09-21 LAB — CBC WITH DIFFERENTIAL (CANCER CENTER ONLY)
Abs Immature Granulocytes: 0.04 10*3/uL (ref 0.00–0.07)
Basophils Absolute: 0.1 10*3/uL (ref 0.0–0.1)
Basophils Relative: 1 %
Eosinophils Absolute: 0.3 10*3/uL (ref 0.0–0.5)
Eosinophils Relative: 4 %
HCT: 33.4 % — ABNORMAL LOW (ref 39.0–52.0)
Hemoglobin: 10.1 g/dL — ABNORMAL LOW (ref 13.0–17.0)
Immature Granulocytes: 1 %
Lymphocytes Relative: 41 %
Lymphs Abs: 2.8 10*3/uL (ref 0.7–4.0)
MCH: 29.3 pg (ref 26.0–34.0)
MCHC: 30.2 g/dL (ref 30.0–36.0)
MCV: 96.8 fL (ref 80.0–100.0)
Monocytes Absolute: 1.2 10*3/uL — ABNORMAL HIGH (ref 0.1–1.0)
Monocytes Relative: 18 %
Neutro Abs: 2.3 10*3/uL (ref 1.7–7.7)
Neutrophils Relative %: 35 %
Platelet Count: 151 10*3/uL (ref 150–400)
RBC: 3.45 MIL/uL — ABNORMAL LOW (ref 4.22–5.81)
RDW: 23.5 % — ABNORMAL HIGH (ref 11.5–15.5)
Smear Review: NORMAL
WBC Count: 6.6 10*3/uL (ref 4.0–10.5)
nRBC: 0 % (ref 0.0–0.2)

## 2022-09-21 LAB — TYPE AND SCREEN
ABO/RH(D): O POS
Antibody Screen: POSITIVE
DAT, IgG: NEGATIVE
DAT, complement: NEGATIVE
Extend sample reason: TRANSFUSED

## 2022-09-21 LAB — CBC
HCT: 32.7 % — ABNORMAL LOW (ref 39.0–52.0)
Hemoglobin: 10.2 g/dL — ABNORMAL LOW (ref 13.0–17.0)
MCH: 30.1 pg (ref 26.0–34.0)
MCHC: 31.2 g/dL (ref 30.0–36.0)
MCV: 96.5 fL (ref 80.0–100.0)
Platelets: 141 10*3/uL — ABNORMAL LOW (ref 150–400)
RBC: 3.39 MIL/uL — ABNORMAL LOW (ref 4.22–5.81)
RDW: 23.6 % — ABNORMAL HIGH (ref 11.5–15.5)
WBC: 6.3 10*3/uL (ref 4.0–10.5)
nRBC: 0 % (ref 0.0–0.2)

## 2022-09-21 LAB — SAMPLE TO BLOOD BANK

## 2022-09-21 NOTE — Progress Notes (Signed)
Perioperative / Anesthesia Services  Pre-Admission Testing Clinical Review / Pre-Operative Anesthesia Consult  Date: 09/24/22  Patient Demographics:  Name: Paul Gumm [name] DOB:   13-Jul-1945 MRN:   [id number]  Planned Surgical Procedure(s):    Case: [id number] Date/Time: 09/28/22 0715   Procedures:      LAPAROSCOPIC RIGHT COLECTOMY, RNFA to assist (Right)     HERNIA REPAIR VENTRAL ADULT   Anesthesia type: General   Pre-op diagnosis:      colon mass     ventral hernia 3 cm reducible   Location: [facility] OR ROOM 04 / [facility]   Surgeons: Leafy Ro, MD     NOTE: Available PAT nursing documentation and vital signs have been reviewed. Clinical nursing staff has updated patient's PMH/PSHx, current medication list, and drug allergies/intolerances to ensure comprehensive history available to assist in medical decision making as it pertains to the aforementioned surgical procedure and anticipated anesthetic course. Extensive review of available clinical information personally performed. [place] PMH and PSHx updated with any diagnoses/procedures that  may have been inadvertently omitted during his intake with the pre-admission testing department's nursing staff.  Clinical Discussion:  Paul Rabbitt [name] is a 77 y.o. male who is submitted for pre-surgical anesthesia review and clearance prior to him undergoing the above procedure. Patient is a Former Games developer. Pertinent PMH includes: CAD, CHF, LBBB, cardiomegaly, bradycardia, aortic atherosclerosis, HTN, HLD, T2DM, hypothyroidism, SDH (s/p LEFT burr hole craniotomy), CKD-III, RIGHT lung nodules, OSAH (on nocturnal PAP therapy), IDA, CLL (Rai stage III), hepatic flexure mass, ventral hernia, BPH, MRSA (LEFT thigh cellulitis), cervical DDD, lumbar spinal stenosis, frequent falls.  Patient is followed by cardiology Azucena Cecil, MD). He was last seen in the cardiology clinic on 09/17/2022; notes reviewed. At the  time of his clinic visit, patient doing well overall from a cardiovascular perspective. Patient being seen after recent admission for cellulitis (MRSA). He has residual weakness, but noted that he was feeling better. Patient denied any chest pain, shortness of breath, PND, orthopnea, palpitations, significant peripheral edema, fatigue, vertiginous symptoms, or presyncope/syncope. Patient with a past medical history significant for cardiovascular diagnoses. Documented physical exam was grossly benign, providing no evidence of acute exacerbation and/or decompensation of the patient's known cardiovascular conditions.  TTE performed on 08/05/2022 revealed a mildly reduced left ventricular systolic function with an EF of 45-50%.  There were no regional wall motion abnormalities.  Moderate to severe LVH noted (speckled appearance). Left ventricular diastolic Doppler parameters consistent with abnormal relaxation (G1DD).  GLS -18.8%.  Right ventricular size and function normal.  Left atrium severely dilated.  Right atrium mildly dilated.  There was mild mitral valve regurgitation.  Aortic valve sclerotic.  All transvalvular gradients were noted to be normal providing no evidence suggestive of valvular stenosis.  Aorta normal in size with no evidence of aneurysmal dilatation.  Workup for cardiac amyloidosis was recommended.  Cardiac CTA was performed on 08/26/2022 revealing an elevated coronary calcium score of 14.3.  This placed patient in the 12th percentile for age/sex/race matched control.  Study suggestive of a minimal (less than 25%) stenosis in the proximal LAD territory.  Patient with normal coronary origin and RIGHT-sided dominance.  Study also revealed stable noncalcified 3 mm RIGHT upper lobe and 4 mm RIGHT lower lobe pulmonary nodules.  Heart failure and blood pressure currently being managed on prescribed vasodilator (hydralazine), beta-blocker (metoprolol succinate), and ARB/ANRi (Entresto) therapies.   Blood pressure of today at 160/90 mmHg.  Patient is on  rosuvastatin for his HLD diagnosis and ASCVD prevention. T2DM well controlled on currently prescribed regimen; last HgbA1c was 6.4% when checked on 08/21/2022.  In the setting of known cardiovascular disease and concurrent T2DM, patient is on an SGLT2i (dapagliflozin) for added cardiovascular and renovascular protection.  He does have an OSAH diagnosis and is reported be compliant with prescribed nocturnal PAP therapy. Patient is able to complete all of his  ADL/IADLs without cardiovascular limitation.  Per the DASI, patient is able to achieve at least 4 METS of physical activity without experiencing any significant degree of angina/anginal equivalent symptoms. No changes were made to his medication regimen during his visit with cardiology.  Patient scheduled to follow-up with outpatient cardiology in 1 month surgery is to UC and canceled or sooner if needed.  Paul Ana [name] underwent routine colonoscopy on 08/23/2022 that revealed a 14 x 15 mm partially obstructing ulcerated mass in the distal ascending colon. Tissue was noted to be friable.  Biopsies were taken and area tattooed.  Pathology resulted as a tubular adenoma. With that said, in the setting of patient's CLL, and appearance of the mass on direct visualization, false negative pathology suspected.  Patient was referred to general surgery for further evaluation; notes reviewed.  Decision was made to pursue surgical resection.  Of note, patient also with ventral abdominal wall hernia, and plans were to address this issue concurrently. Patient subsequently scheduled for a LAPAROSCOPIC RIGHT COLECTOMY; VENTRAL HERNIA REPAIR on 09/28/2022 with Dr. Sterling Big, MD.  Given patient's past medical history significant for cardiovascular diagnoses, presurgical cardiac clearance was sought by the PAT team.  Office also requested medical clearance from patient's PCP.  Clearances were obtained as  follows.    Per internal medicine Harlon Flor, PA-C), "patient is optimized for surgery from a medical standpoint.  He is cleared to proceed with surgery at an overall MODERATE risk".  Per cardiology, "patient is overall stable from a cardiac perspective. METs > 4. No further cardiac workup prior to surgery. According to the RCRI the patient is MODERATE risk, 10.1%, 30 day risk of death, MI or cardiac arrest. Monitor volume status per-operative period".   In review of his medication reconciliation, it is noted that patient is currently on prescribed daily antithrombotic therapy. He has been instructed on recommendations for holding his daily low-dose ASA for 5 days prior to his procedure with plans to restart as soon as postoperative bleeding risk felt to be minimized by his attending surgeon. The patient has been instructed that his last dose of his ASA should be on 09/22/2022.  Patient reports previous perioperative complications with anesthesia in the past.  Documentation indicates that patient had a (+) difficult intubation back in 09/2015.  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 Second attempt made using direct laryngoscopy with MAC 4 blade and 8.0 ETT, however tube was too large to pass through the cords.  Third attempt successful using MAC 4 and a 7.5 ETT.  Information regarding airway difficulties added to EMR for continuity of care purposes to ensure that information is communicated to anesthesia team.  in review of the available records, it is noted that patient underwent a general anesthetic course here at [facility] (ASA III) in 08/2022 without documented complications.      09/22/2022    8:57 AM 09/17/2022   10:11 AM 09/17/2022    9:26 AM  Vitals with BMI  Height 6\' 1"     Weight  218 lbs    BMI 28.77    Systolic 160 167 540  Diastolic 80 75 80  Pulse 63 47 50    Providers/Specialists:   NOTE:  Primary physician provider listed below. Patient may have been seen by APP or partner within same practice.   PROVIDER ROLE / SPECIALTY LAST Cherly Hensen, MD General Surgery (Surgeon) 09/08/2022  Wilford Corner, PA-C Primary Care Provider 09/07/2022  Debbe Odea, MD Cardiology 09/17/2022  [name], MD Medical Oncology 09/03/2022   Allergies:  Patient has no known allergies.  Current Home Medications:   No current facility-administered medications for this encounter.    aspirin EC 81 MG tablet   bisacodyl (DULCOLAX) 5 MG EC tablet   dapagliflozin propanediol (FARXIGA) 10 MG TABS tablet   hydrALAZINE (APRESOLINE) 50 MG tablet   levothyroxine (SYNTHROID) 200 MCG tablet   metFORMIN (GLUCOPHAGE) 500 MG tablet   metoprolol succinate (TOPROL XL) 25 MG 24 hr tablet   metroNIDAZOLE (FLAGYL) 500 MG tablet   neomycin (MYCIFRADIN) 500 MG tablet   oxyCODONE-acetaminophen (PERCOCET) 10-325 MG tablet   [START ON 10/22/2022] oxyCODONE-acetaminophen (PERCOCET) 10-325 MG tablet   polyethylene glycol powder (MIRALAX) 17 GM/SCOOP powder   rosuvastatin (CRESTOR) 40 MG tablet   sacubitril-valsartan (ENTRESTO) 24-26 MG   History:   Past Medical History:  Diagnosis Date   Adenoma of left adrenal gland    Aortic atherosclerosis (HCC)    BPH (benign prostatic hyperplasia)    Bradycardia    CAD (coronary artery disease) 08/26/2022   a.) cCTA 08/26/2022: Ca2+ 14.3 (12th %'ile for age/sex.race match control); (<25%) pLAD   Cellulitis of left thigh 08/2022   CHF (congestive heart failure) (HCC)    a.) TTE 10/23/2015: EF 60-65%, mod-sev LVH, mild biatrial dil, degen MV disease, AoV sclerosis, asc Ao 38 mm, G1DD; b.) TTE 08/05/2022: EF 45-50%, mod-sev LVH (speckled pattern), sev biatrial dil, mild MR, AoV sclerosis, G1DD   Chronic pain syndrome    a.) on COT managed by pain management   Chronic, continuous use of opioids    a.) chronic pain syndrome/chronic back pain; managed by  pain management   CKD (chronic kidney disease) stage 3, GFR 30-59 ml/min (HCC)    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   a.) Rai stage III   DDD (degenerative disc disease), cervical    Difficult airway 10/22/2015   a.) 1st attempt with glidescope  --> macroglossic (unsuccessful); b.) 2nd attempt with Mac 4 and direct laryngoscopy with a 8.0 ETT --> tube too large to pass through the cords; b.) 3rd attempt (successful) with a Mac 4 and a 7.5 tube   Diverticulosis    DM (diabetes mellitus), type 2 (HCC)    Dysplasia of prostate    Erectile dysfunction    Frequent falls    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   History of MRSA infection 08/28/2022   a.) MRSA PCR (+) 08/28/2022; culture from LEFT thigh abscess   HTN (hypertension)    Hyperlipidemia    Hypogonadism in male    Hypothyroidism    IDA (iron deficiency anemia)    LBBB (left bundle branch block)    Long-term use of aspirin therapy    Lumbar spinal stenosis    Mild cardiomegaly    Nodule  of right lung    Orchitis and epididymitis 06/16/2013   OSA on CPAP    Palindromic rheumatism, hand    Pericardial effusion    Prostatitis    Sepsis (HCC) 08/2022   Subdural hematoma (HCC) 10/22/2015   a.) s/p traumatic mechanical fall --> CT head 10/22/2015: high-density SDH the left cerebral convexity with13 mm midline shift --> s/p LEFT frontal burr hole craniotomy   Tubular adenoma of colon    Ventral hernia    Past Surgical History:  Procedure Laterality Date   BACK SURGERY     BIOPSY  08/21/2022   Procedure: BIOPSY;  Surgeon: Jaynie Collins, DO;  Location: [facility] ENDOSCOPY;  Service: Gastroenterology;;   BIOPSY  08/23/2022   Procedure: BIOPSY;  Surgeon: Jaynie Collins, DO;   Location: [facility] ENDOSCOPY;  Service: Gastroenterology;;   Guss Bunde OF CRANIUM Left 10/22/2015   COLONOSCOPY WITH PROPOFOL N/A 08/23/2022   Procedure: COLONOSCOPY WITH PROPOFOL;  Surgeon: Jaynie Collins, DO;  Location: [facility] ENDOSCOPY;  Service: Gastroenterology;  Laterality: N/A;   ESOPHAGOGASTRODUODENOSCOPY (EGD) WITH PROPOFOL N/A 08/21/2022   Procedure: ESOPHAGOGASTRODUODENOSCOPY (EGD) WITH PROPOFOL;  Surgeon: Jaynie Collins, DO;  Location: [facility] ENDOSCOPY;  Service: Gastroenterology;  Laterality: N/A;   GIVENS CAPSULE STUDY N/A 08/23/2022   Procedure: GIVENS CAPSULE STUDY;  Surgeon: Jaynie Collins, DO;  Location: [facility] ENDOSCOPY;  Service: Gastroenterology;  Laterality: N/A;   IRRIGATION AND DEBRIDEMENT ABSCESS Left 08/28/2022   Procedure: IRRIGATION AND DEBRIDEMENT ABSCESS LEFT UPPER THIGH/GROIN;  Surgeon: Leafy Ro, MD;  Location: [facility] ORS;  Service: General;  Laterality: Left;   POLYPECTOMY  08/23/2022   Procedure: POLYPECTOMY;  Surgeon: Jaynie Collins, DO;  Location: [facility] ENDOSCOPY;  Service: Gastroenterology;;   SUBMUCOSAL TATTOO INJECTION  08/23/2022   Procedure: SUBMUCOSAL TATTOO INJECTION;  Surgeon: Jaynie Collins, DO;  Location: [facility] ENDOSCOPY;  Service: Gastroenterology;;   TONSILLECTOMY     Family History  Problem Relation Age of Onset   Cancer Maternal Aunt    Prostate cancer Neg Hx    Kidney disease Neg Hx    Kidney cancer Neg Hx    Bladder Cancer Neg Hx    Social History   Tobacco Use   Smoking status: Former    Types: Cigarettes    Passive exposure: Past   Smokeless tobacco: Never   Tobacco comments:    quit 30 years ago  Vaping Use   Vaping status: Never Used  Substance Use Topics   Alcohol use: No    Alcohol/week: 0.0 standard drinks of alcohol   Drug use: No    Pertinent Clinical Results:  LABS:   Lab Results  Component Value Date   WBC 6.6 09/21/2022   HGB 10.1 (L) 09/21/2022   HCT 33.4 (L) 09/21/2022   MCV  96.8 09/21/2022   PLT 151 09/21/2022   Lab Results  Component Value Date   NA 139 09/03/2022   K 3.9 09/03/2022   CO2 23 09/03/2022   GLUCOSE 149 (H) 09/03/2022   BUN 13 09/03/2022   CREATININE 1.21 09/03/2022   CALCIUM 8.2 (L) 09/03/2022   EGFR 72 07/06/2021   GFRNONAA >60 09/03/2022    ECG: Date: 08/26/2022 Time ECG obtained: 1145 AM Rate: 63 bpm Rhythm:  SR with 1st degree AV block with occasional PVCs; LBBB Axis (leads I and aVF): Left axis deviation Intervals: PR 234 ms. QRS 152 ms. QTc 421 ms. ST segment and T wave changes: No evidence of acute ST segment elevation or depression.  Comparison: Similar to previous tracing obtained on 06/07/2022   IMAGING / PROCEDURES: CT CORONARY MORPH W/CTA COR W/SCORE W/CA W/CM &/OR WO/CM performed on 08/26/2022 Stable 3 mm right upper lobe and 4 mm right lower lobe noncalcified lung nodules. No follow-up needed if patient is low-risk (and has no known or suspected primary neoplasm). Non-contrast chest CT can be considered in 12 months if patient is high-risk.   Coronary calcium score of 14.3. This was 12th percentile for age and sex matched control. Normal coronary origin with right dominance. Minimal proximal LAD stenosis (<25%). CAD-RADS 1. Minimal non-obstructive CAD (0-24%). Consider preventive therapy and risk factor modification.  TRANSTHORACIC ECHOCARDIOGRAM performed on 08/05/2022 Left ventricular ejection fraction, by estimation, is 45 to 50%. Left ventricular ejection fraction by 3D volume is 47 %. The left ventricle has mildly decreased function. The left ventricle has no regional wall motion abnormalities. 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 Left ventricular diastolic parameters are consistent with Grade I diastolic dysfunction (impaired relaxation). The average left ventricular global longitudinal strain is -18.8 %. Consider w/u for [facility] if clinically indicated.  Right  ventricular systolic function is normal. The right ventricular size is normal. Tricuspid regurgitation signal is inadequate for assessing PA pressure.  Left atrial size was severely dilated.  Right atrial size was mildly dilated.  A small pericardial effusion is present. There is no evidence of cardiac tamponade.  The mitral valve is normal in structure. Mild mitral valve regurgitation. No evidence of mitral stenosis.  The aortic valve is tricuspid. Aortic valve regurgitation is not visualized. Aortic valve sclerosis/calcification is present, without any evidence of aortic stenosis.  The inferior vena cava is normal in size with greater than 50% respiratory variability, suggesting right atrial pressure of 3 mmHg.   CT ABDOMEN PELVIS W CONTRAST performed on 08/26/2022 Subcutaneous fat stranding within the left lower quadrant anterior abdominal wall extending into the anterior proximal left thigh, likely related to contusion given history of recent trauma. No fluid collection or hematoma. No acute displaced fracture. Retroperitoneal and pelvic lymphadenopathy as above, with waxing and waning appearance. 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 1.9 cm hypodensity within the medial aspect of the spleen, nonspecific. Leukemic involvement of the spleen cannot be excluded. Distal colonic diverticulosis without diverticulitis. Aortic atherosclerosis  CT CERVICAL SPINE WO CONTRAST performed on 08/26/2022 No acute intracranial pathology. Status post left frontal burr hole craniotomy. Mucosal thickening and small air-fluid level of the right maxillary sinus. Correlate for acute sinusitis. No fracture or static subluxation of the cervical spine. 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  Impression and Plan:  Paul Yeiser [name] has been referred for pre-anesthesia review and clearance prior to him undergoing the planned anesthetic and procedural courses. Available  labs, pertinent testing, and imaging results were personally reviewed by me in preparation for upcoming operative/procedural course. [facility] medical record has been updated following extensive record review and patient interview with PAT staff.   This patient has been appropriately cleared by cardiology (MODERATE) and internal medicine (MODERATE) with the individually indicated risks of patient experiencing significant perioperative complications. Based on clinical review performed today (09/24/22), barring any significant acute changes in the patient's overall condition, it is anticipated that he will be able to proceed with the planned surgical intervention. Any acute changes in clinical condition may necessitate his procedure being postponed and/or cancelled. Patient will meet with anesthesia team (MD and/or CRNA) on the day of his procedure for  preoperative evaluation/assessment. Questions regarding anesthetic course will be fielded at that time.   Pre-surgical instructions were reviewed with the patient during his PAT appointment, and questions were fielded to satisfaction by PAT clinical staff. He has been instructed on which medications that he will need to hold prior to surgery, as well as the ones that have been deemed safe/appropriate to take on the day of his procedure. As part of the general education provided by PAT, patient made aware both verbally and in writing, that he would need to abstain from the use of any illegal substances during his perioperative course.  He was advised that failure to follow the provided instructions could necessitate case cancellation or result in serious perioperative complications up to and including death. Patient encouraged to contact PAT and/or his surgeon's office to discuss any questions or concerns that may arise prior to surgery; verbalized understanding.   Quentin Mulling, MSN, APRN, FNP-C, CEN [facility]  Peri-operative Services Nurse  Practitioner Phone: [phone] Fax: [phone] Email: [email or web address] 09/24/22 9:07 AM  NOTE: This note has been prepared using Scientist, clinical (histocompatibility and immunogenetics). Despite my best ability to proofread, there is always the potential that unintentional transcriptional errors may still occur from this process.

## 2022-09-22 ENCOUNTER — Inpatient Hospital Stay: Payer: No Typology Code available for payment source

## 2022-09-22 ENCOUNTER — Ambulatory Visit (INDEPENDENT_AMBULATORY_CARE_PROVIDER_SITE_OTHER): Payer: No Typology Code available for payment source | Admitting: Surgery

## 2022-09-22 ENCOUNTER — Encounter: Payer: Self-pay | Admitting: Surgery

## 2022-09-22 ENCOUNTER — Inpatient Hospital Stay: Payer: No Typology Code available for payment source | Admitting: Oncology

## 2022-09-22 VITALS — BP 160/80 | HR 63 | Temp 98.2°F | Ht 73.0 in | Wt 218.0 lb

## 2022-09-22 DIAGNOSIS — K6389 Other specified diseases of intestine: Secondary | ICD-10-CM | POA: Diagnosis not present

## 2022-09-22 NOTE — Progress Notes (Unsigned)
Medical Clearance has been received from [facility], PA-C. The patient is cleared at Low risk for surgery.

## 2022-09-22 NOTE — Progress Notes (Signed)
Outpatient Surgical Follow Up  09/22/2022  Paul Ana [name] is an 77 y.o. male.   Chief Complaint  Patient presents with   Routine Post Op    HPI: Paul Briggs is a 77 year old male well-known to me with history of CL leukemia and chronic anemia and most recently hospitalized for a soft tissue infection of the upper left thigh.  I did perform I&D and he has improved significantly.  He has completed antibiotics and he still has small open wound but overall feels better.  He also sees oncology due to his anemia and leukemia.  He received iron infusion w good Hb response, hb now 10  He also had a recent  Endoscopy that i personally reviewed Ulcerating partially obstructing hepatic flexure mass very concerning for carcinoma,pathology showed TA. I am concerned this biopsy understimates actual disease. It was tattoed. Other issues include, CKD, Anemia, DM He is here to discuss upcoming colectomy, he was seen by cards which deemed his in reasonable shape to undergo needed surgery  Past Medical History:  Diagnosis Date   Adenoma of left adrenal gland    Aortic atherosclerosis (HCC)    BPH (benign prostatic hyperplasia)    CAD (coronary artery disease) 08/26/2022   a.) cCTA 08/26/2022: Ca2+ 14.3 (12th %'ile for age/sex.race match control); (<25%) pLAD   Cellulitis of left thigh 08/2022   CHF (congestive heart failure) (HCC)    a.) TTE 10/23/2015: EF 60-65%, mod-sev LVH, mild biatrial dil, degen MV disease, AoV sclerosis, asc Ao 38 mm, G1DD; b.) TTE 08/05/2022: EF 45-50%, mod-sev LVH (speckled pattern), sev biatrial dil, mild MR, AoV sclerosis, G1DD   Chronic pain syndrome    a.) on COT managed by pain management   Chronic, continuous use of opioids    a.) chronic pain syndrome/chronic back pain; managed by pain management   CKD (chronic kidney disease) stage 3, GFR 30-59 ml/min (HCC)    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   a.) Rai stage III   DDD (degenerative disc  disease), cervical    Difficult airway 10/22/2015   a.) 1st attempt with glidescope  --> macroglossic (unsuccessful); b.) 2nd attempt with Mac 4 and direct laryngoscopy with a 8.0 ETT --> tube too large to pass through the cords; b.) 3rd attempt (successful) with a Mac 4 and a 7.5 tube   Diverticulosis    DM (diabetes mellitus), type 2 (HCC)    Dysplasia of prostate    Erectile dysfunction    Frequent falls    Hepatic flexure mass 08/23/2022   a.) colonoscopy 08/23/2022: 14 x 15 mm partially obstructing ulcerated distal ascending colon mass; tissue friable --> pathology resulted as tubular adenoma, however felt to be false (-) --> referred to sugery and colectomy recommended.   History of MRSA infection 08/28/2022   a.) MRSA PCR (+) 08/28/2022; culture from LEFT thigh abscess   HTN (hypertension)    Hyperlipidemia    Hypogonadism in male    Hypothyroidism    IDA (iron deficiency anemia)    LBBB (left bundle branch block)    Long-term use of aspirin therapy    Lumbar spinal stenosis    Mild cardiomegaly    Nodule of right lung    Orchitis and epididymitis 06/16/2013   OSA on CPAP    Palindromic rheumatism, hand    Pericardial effusion    Prostatitis    Sepsis (HCC) 08/2022   Subdural hematoma (HCC) 10/22/2015   a.) s/p traumatic mechanical fall --> CT head 10/22/2015:  high-density SDH the left cerebral convexity with13 mm midline shift --> s/p LEFT frontal burr hole craniotomy   Tubular adenoma of colon    Ventral hernia     Past Surgical History:  Procedure Laterality Date   BACK SURGERY     BIOPSY  08/21/2022   Procedure: BIOPSY;  Surgeon: Jaynie Collins, DO;  Location: [facility] ENDOSCOPY;  Service: Gastroenterology;;   BIOPSY  08/23/2022   Procedure: BIOPSY;  Surgeon: Jaynie Collins, DO;  Location: [facility] ENDOSCOPY;  Service: Gastroenterology;;   Guss Bunde OF CRANIUM Left 10/22/2015   COLONOSCOPY WITH PROPOFOL N/A 08/23/2022   Procedure: COLONOSCOPY WITH PROPOFOL;   Surgeon: Jaynie Collins, DO;  Location: [facility] ENDOSCOPY;  Service: Gastroenterology;  Laterality: N/A;   ESOPHAGOGASTRODUODENOSCOPY (EGD) WITH PROPOFOL N/A 08/21/2022   Procedure: ESOPHAGOGASTRODUODENOSCOPY (EGD) WITH PROPOFOL;  Surgeon: Jaynie Collins, DO;  Location: [facility] ENDOSCOPY;  Service: Gastroenterology;  Laterality: N/A;   GIVENS CAPSULE STUDY N/A 08/23/2022   Procedure: GIVENS CAPSULE STUDY;  Surgeon: Jaynie Collins, DO;  Location: [facility] ENDOSCOPY;  Service: Gastroenterology;  Laterality: N/A;   IRRIGATION AND DEBRIDEMENT ABSCESS Left 08/28/2022   Procedure: IRRIGATION AND DEBRIDEMENT ABSCESS LEFT UPPER THIGH/GROIN;  Surgeon: Leafy Ro, MD;  Location: [facility] ORS;  Service: General;  Laterality: Left;   POLYPECTOMY  08/23/2022   Procedure: POLYPECTOMY;  Surgeon: Jaynie Collins, DO;  Location: [facility] ENDOSCOPY;  Service: Gastroenterology;;   SUBMUCOSAL TATTOO INJECTION  08/23/2022   Procedure: SUBMUCOSAL TATTOO INJECTION;  Surgeon: Jaynie Collins, DO;  Location: [facility] ENDOSCOPY;  Service: Gastroenterology;;   TONSILLECTOMY      Family History  Problem Relation Age of Onset   Cancer Maternal Aunt    Prostate cancer Neg Hx    Kidney disease Neg Hx    Kidney cancer Neg Hx    Bladder Cancer Neg Hx     Social History:  reports that he has quit smoking. His smoking use included cigarettes. He has been exposed to tobacco smoke. He has never used smokeless tobacco. He reports that he does not drink alcohol and does not use drugs.  Allergies: No Known Allergies  Medications reviewed.    ROS Full ROS performed and is otherwise negative other than what is stated in HPI   BP (!) 160/80   Pulse 63   Temp 98.2 F (36.8 C)   Ht 6\' 1"  (1.854 m)   Wt 218 lb (98.9 kg)   SpO2 98%   BMI 28.76 kg/m   Physical Exam  Physical Exam Vitals and nursing note reviewed. Exam conducted with a chaperone present.  Cardiovascular:     Rate and Rhythm: Normal  rate.     Heart sounds: No murmur heard. Pulmonary:     Effort: Pulmonary effort is normal. No respiratory distress.     Breath sounds: No stridor. No wheezing.  Abdominal:     General: Abdomen is flat. There is no distension.     Palpations: Abdomen is soft. There is no mass.     Tenderness: There is no abdominal tenderness. There is no guarding.     Hernia: A hernia is present.     Comments: Umbilical hernia 2 cms  Musculoskeletal:        General: Normal range of motion.     Cervical back: Normal range of motion and neck supple. No rigidity or tenderness.  Lymphadenopathy:     Cervical: No cervical adenopathy.  Skin:    General: Skin is warm.  Capillary Refill: Capillary refill takes less than 2 seconds.     Coloration: Skin is not jaundiced.     Comments: Open wound left upper thigh and left groin, helaing well.  Very superficial and w significant improvement. no evidence of drainable collections  Neurological:     General: No focal deficit present.     Mental Status: He is oriented to person, place, and time.  Psychiatric:        Mood and Affect: Mood normal.        Behavior: Behavior normal.        Thought Content: Thought content normal.        Judgment: Judgment normal.      Assessment/Plan: 77 year old male with multiple comorbidities , he is here for an unresectable hepatic flexure near obstructing mass.  Although pathology does not show carcinoma endoscopic findings are very concerning.  Given the endoscopic findings I do recommend that is prudent to treat this as cancer.  I do recommend a lap right colectomy.  I had a discussion with the patient in detail regarding his disease process.  I do think that we can do this minimally invasive with a hand-assisted.  We will plan to perform hand-assisted right colectomy next  week as he has been optimized from medical perspective.  We will be able to fix ventral defect as well.  Procedure discussed with the patient in detail.   Risk, benefits and possible implications including but not limited to, bleeding, infection anastomotic leak, the need for ostomy.  He understands and is in agreement.  Please note that I spent greater than 40 minutes in this encounter including personally reviewing imaging studies, coordinating his care, placing orders and performing documentation.  Please note that this encounter was not related to recent procedure as this is encounter mainly focused on the unresectable colon mass.    No results found.  Assessment/Plan:  Sterling Big, MD [facility] General Surgeon

## 2022-09-22 NOTE — Patient Instructions (Signed)
We have discussed removing a portion of your damaged colon through 4 small incisions today. We have scheduled this surgery for 09/28/22 at [facility] with Dr. Everlene Farrier. Please plan a hospital stay of 3-7 days for surgery and recovery time.  We will have you complete a bowel prep prior to your surgery. Please see information provided.  You have also been given a (Blue) Pre-Care Sheet with more information regarding your particular surgery. Our surgery scheduler will call you to verify surgery date and to go over information.  Please review all information given.  You will need to arrange to be out of work for approximately 2 weeks and then you may return with a lifting restriction for 4 more weeks. If you have FMLA or Disability paperwork that needs to be filled out, please have your company fax your paperwork to [phone] or you may drop this by either office. This paperwork will be filled out within 3 days after your surgery has been completed.  Please call our office with any questions or concerns prior to your scheduled surgery.   Laparoscopic Colectomy Laparoscopic colectomy is surgery to remove part or all of the large intestine (colon). This procedure may be used to treat several conditions, including: Inflammation and infection of the colon (diverticulitis). Tumors or masses in the colon. Inflammatory bowel disease, such as Crohn disease or ulcerative colitis. Colectomy is an option when symptoms cannot be controlled with medicines. Bleeding from the colon that cannot be controlled by another method. Blockage or obstruction of the colon.  Tell a health care provider about: Any allergies you have. All medicines you are taking, including vitamins, herbs, eye drops, creams, and over-the-counter medicines. Any problems you or family members have had with anesthetic medicines. Any blood disorders you have. Any surgeries you have had. Any medical conditions you have. What are the  risks? Generally, this is a safe procedure. However, problems may occur, including: Infection. Bleeding. Allergic reactions to medicines or dyes. Damage to other structures or organs. Leaking from where the colon was sewn together. Future blockage of the small intestines from scar tissue. Another surgery may be needed to repair this. Needing to convert to an open procedure. Complications such as damage to other organs or excessive bleeding may require the surgeon to convert from a laparoscopic procedure to an open procedure. This involves making a larger incision in the abdomen.  What happens before the procedure? Staying hydrated Follow instructions from your health care provider about hydration, which may include: Up to 2 hours before the procedure - you may continue to drink clear liquids, such as water, clear fruit juice, black coffee, and plain tea.  Eating and drinking restrictions Follow instructions from your health care provider about eating and drinking, which may include: 8 hours before the procedure - stop eating heavy meals, meals with high fiber, or foods such as meat, fried foods, or fatty foods. 6 hours before the procedure - stop eating light meals or foods, such as toast or cereal. 6 hours before the procedure - stop drinking milk or drinks that contain milk. 2 hours before the procedure - stop drinking clear liquids.  Medicines Ask your health care provider about: Changing or stopping your regular medicines. This is especially important if you are taking diabetes medicines or blood thinners. Taking medicines such as aspirin and ibuprofen. These medicines can thin your blood. Do not take these medicines before your procedure if your health care provider instructs you not to. You may be given  antibiotic medicine to clean out bacteria from your colon. Follow the directions carefully and take the medicine at the correct time. General instructions You may be prescribed an  oral bowel prep to clean out your colon in preparation for the surgery: Follow instructions from your health care provider about how to do this. Do not eat or drink anything else after you have started the bowel prep, unless your health care provider tells you it is safe to do so. Do not use any products that contain nicotine or tobacco, such as cigarettes and e-cigarettes. If you need help quitting, ask your health care provider. What happens during the procedure? To reduce your risk of infection: Your health care team will wash or sanitize their hands. Your skin will be washed with soap. An IV tube will be inserted into one of your veins to deliver fluid and medication. You will be given one of the following: A medicine to help you relax (sedative). A medicine to make you fall asleep (general anesthetic). Small monitors will be connected to your body. They will be used to check your heart, blood pressure, and oxygen level. A breathing tube may be placed into your lungs during the procedure. A thin, flexible tube (catheter) will be placed into your bladder to drain urine. A tube may be placed through your nose and into your stomach to drain stomach fluids (nasogastric tube, or NG tube). Your abdomen will be filled with air so it expands. This gives the surgeon more room to operate and makes your organs easier to see. Several small cuts (incisions) will be made in your abdomen. A thin, lighted tube with a tiny camera on the end (laparoscope) will be put through one of the small incisions. The camera on the laparoscope will send a picture to a computer screen in the operating room. This will give the surgeon a good view inside your abdomen. Hollow tubes will be put through the other small incisions in your abdomen. The tools that are needed for the procedure will be put through these tubes. Clamps or staples will be put on both ends of the diseased part of the colon. The part of the intestine  between the clamps or staples will be removed. If possible, the ends of the healthy colon that remain will be stitched (sutured) or stapled together to allow your body to pass waste (stool). Sometimes, the remaining colon cannot be stitched back together. If this is the case, a colostomy will be needed. If you need a colostomy: An opening to the outside of your body (stoma) will be made through your abdomen. The end of your colon will be brought to the opening. It will be stitched to the skin. A bag will be attached to the opening. Stool will drain into this removable bag. The colostomy may be temporary or permanent. The incisions from the colectomy will be closed with sutures or staples. The procedure may vary among health care providers and hospitals. What happens after the procedure? Your blood pressure, heart rate, breathing rate, and blood oxygen level will be monitored until the medicines you were given have worn off. You will receive fluids through an IV tube until your bowels start to work properly. Once your bowels are working again, you will be given clear liquids first and then solid food as tolerated. You will be given medicines to control your pain and nausea, if needed. Do not drive for 24 hours if you were given a sedative. This information is not  intended to replace advice given to you by your health care provider. Make sure you discuss any questions you have with your health care provider. Document Released: 04/03/2002 Document Revised: 10/13/2015 Document Reviewed: 10/13/2015 Elsevier Interactive Patient Education  2018 [employer].     Laparoscopic Colectomy, Care After This sheet gives you information about how to care for yourself after your procedure. Your health care provider may also give you more specific instructions. If you have problems or questions, contact your health care provider. What can I expect after the procedure? After your procedure, it is common to  have the following: Pain in your abdomen, especially in the incision areas. You will be given medicine to control the pain. Tiredness. This is a normal part of the recovery process. Your energy level will return to normal over the next several weeks. Changes in your bowel movements, such as constipation or needing to go more often. Talk with your health care provider about how to manage this.  Follow these instructions at home: Medicines Take over-the-counter and prescription medicines only as told by your health care provider. Do not drive or use heavy machinery while taking prescription pain medicine. Do not drink alcohol while taking prescription pain medicine. If you were prescribed an antibiotic medicine, use it as told by your health care provider. Do not stop using the antibiotic even if you start to feel better. Incision care Follow instructions from your health care provider about how to take care of your incision areas. Make sure you: Keep your incisions clean and dry. Wash your hands with soap and water before and after applying medicine to the areas, and before and after changing your bandage (dressing). If soap and water are not available, use hand sanitizer. Change your dressing as told by your health care provider. Leave stitches (sutures), skin glue, or adhesive strips in place. These skin closures may need to stay in place for 2 weeks or longer. If adhesive strip edges start to loosen and curl up, you may trim the loose edges. Do not remove adhesive strips completely unless your health care provider tells you to do that. Do not wear tight clothing over the incisions. Tight clothing may rub and irritate the incision areas, which may cause the incisions to open. Do not take baths, swim, or use a hot tub until your health care provider approves. Ask your health care provider if you can take showers. You may only be allowed to take sponge baths for bathing. Check your incision area  every day for signs of infection. Check for: More redness, swelling, or pain. More fluid or blood. Warmth. Pus or a bad smell. Activity Avoid lifting anything that is heavier than 10 lb (4.5 kg) for 2 weeks or until your health care provider says it is okay. You may resume normal activities as told by your health care provider. Ask your health care provider what activities are safe for you. Take rest breaks during the day as needed. Eating and drinking Follow instructions from your health care provider about what you can eat after surgery. To prevent or treat constipation while you are taking prescription pain medicine, your health care provider may recommend that you: Drink enough fluid to keep your urine clear or pale yellow. Take over-the-counter or prescription medicines. Eat foods that are high in fiber, such as fresh fruits and vegetables, whole grains, and beans. Limit foods that are high in fat and processed sugars, such as fried and sweet foods. General instructions Ask your  health care provider when you will need an appointment to get your sutures or staples removed. Keep all follow-up visits as told by your health care provider. This is important. Contact a health care provider if: You have more redness, swelling, or pain around your incisions. You have more fluid or blood coming from the incisions. Your incisions feel warm to the touch. You have pus or a bad smell coming from your incisions or your dressing. You have a fever. You have an incision that breaks open (edges not staying together) after sutures or staples have been removed. Get help right away if: You develop a rash. You have chest pain or difficulty breathing. You have pain or swelling in your legs. You feel light-headed or you faint. Your abdomen swells (becomes distended). You have nausea or vomiting. You have blood in your stool (feces). This information is not intended to replace advice given to you by  your health care provider. Make sure you discuss any questions you have with your health care provider. Document Released: 07/31/2004 Document Revised: 10/13/2015 Document Reviewed: 10/13/2015 Elsevier Interactive Patient Education  [employer].

## 2022-09-22 NOTE — H&P (View-Only) (Signed)
 Outpatient Surgical Follow Up  09/22/2022  Paul Ana [name] is an 77 y.o. male.   Chief Complaint  Patient presents with   Routine Post Op    HPI: Paul Briggs is a 77 year old male well-known to me with history of CL leukemia and chronic anemia and most recently hospitalized for a soft tissue infection of the upper left thigh.  I did perform I&D and he has improved significantly.  He has completed antibiotics and he still has small open wound but overall feels better.  He also sees oncology due to his anemia and leukemia.  He received iron infusion w good Hb response, hb now 10  He also had a recent  Endoscopy that i personally reviewed Ulcerating partially obstructing hepatic flexure mass very concerning for carcinoma,pathology showed TA. I am concerned this biopsy understimates actual disease. It was tattoed. Other issues include, CKD, Anemia, DM He is here to discuss upcoming colectomy, he was seen by cards which deemed his in reasonable shape to undergo needed surgery  Past Medical History:  Diagnosis Date   Adenoma of left adrenal gland    Aortic atherosclerosis (HCC)    BPH (benign prostatic hyperplasia)    CAD (coronary artery disease) 08/26/2022   a.) cCTA 08/26/2022: Ca2+ 14.3 (12th %'ile for age/sex.race match control); (<25%) pLAD   Cellulitis of left thigh 08/2022   CHF (congestive heart failure) (HCC)    a.) TTE 10/23/2015: EF 60-65%, mod-sev LVH, mild biatrial dil, degen MV disease, AoV sclerosis, asc Ao 38 mm, G1DD; b.) TTE 08/05/2022: EF 45-50%, mod-sev LVH (speckled pattern), sev biatrial dil, mild MR, AoV sclerosis, G1DD   Chronic pain syndrome    a.) on COT managed by pain management   Chronic, continuous use of opioids    a.) chronic pain syndrome/chronic back pain; managed by pain management   CKD (chronic kidney disease) stage 3, GFR 30-59 ml/min (HCC)    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   a.) Rai stage III   DDD (degenerative disc  disease), cervical    Difficult airway 10/22/2015   a.) 1st attempt with glidescope  --> macroglossic (unsuccessful); b.) 2nd attempt with Mac 4 and direct laryngoscopy with a 8.0 ETT --> tube too large to pass through the cords; b.) 3rd attempt (successful) with a Mac 4 and a 7.5 tube   Diverticulosis    DM (diabetes mellitus), type 2 (HCC)    Dysplasia of prostate    Erectile dysfunction    Frequent falls    Hepatic flexure mass 08/23/2022   a.) colonoscopy 08/23/2022: 14 x 15 mm partially obstructing ulcerated distal ascending colon mass; tissue friable --> pathology resulted as tubular adenoma, however felt to be false (-) --> referred to sugery and colectomy recommended.   History of MRSA infection 08/28/2022   a.) MRSA PCR (+) 08/28/2022; culture from LEFT thigh abscess   HTN (hypertension)    Hyperlipidemia    Hypogonadism in male    Hypothyroidism    IDA (iron deficiency anemia)    LBBB (left bundle branch block)    Long-term use of aspirin therapy    Lumbar spinal stenosis    Mild cardiomegaly    Nodule of right lung    Orchitis and epididymitis 06/16/2013   OSA on CPAP    Palindromic rheumatism, hand    Pericardial effusion    Prostatitis    Sepsis (HCC) 08/2022   Subdural hematoma (HCC) 10/22/2015   a.) s/p traumatic mechanical fall --> CT head 10/22/2015:  high-density SDH the left cerebral convexity with13 mm midline shift --> s/p LEFT frontal burr hole craniotomy   Tubular adenoma of colon    Ventral hernia     Past Surgical History:  Procedure Laterality Date   BACK SURGERY     BIOPSY  08/21/2022   Procedure: BIOPSY;  Surgeon: Jaynie Collins, DO;  Location: [facility] ENDOSCOPY;  Service: Gastroenterology;;   BIOPSY  08/23/2022   Procedure: BIOPSY;  Surgeon: Jaynie Collins, DO;  Location: [facility] ENDOSCOPY;  Service: Gastroenterology;;   Guss Bunde OF CRANIUM Left 10/22/2015   COLONOSCOPY WITH PROPOFOL N/A 08/23/2022   Procedure: COLONOSCOPY WITH PROPOFOL;   Surgeon: Jaynie Collins, DO;  Location: [facility] ENDOSCOPY;  Service: Gastroenterology;  Laterality: N/A;   ESOPHAGOGASTRODUODENOSCOPY (EGD) WITH PROPOFOL N/A 08/21/2022   Procedure: ESOPHAGOGASTRODUODENOSCOPY (EGD) WITH PROPOFOL;  Surgeon: Jaynie Collins, DO;  Location: [facility] ENDOSCOPY;  Service: Gastroenterology;  Laterality: N/A;   GIVENS CAPSULE STUDY N/A 08/23/2022   Procedure: GIVENS CAPSULE STUDY;  Surgeon: Jaynie Collins, DO;  Location: [facility] ENDOSCOPY;  Service: Gastroenterology;  Laterality: N/A;   IRRIGATION AND DEBRIDEMENT ABSCESS Left 08/28/2022   Procedure: IRRIGATION AND DEBRIDEMENT ABSCESS LEFT UPPER THIGH/GROIN;  Surgeon: Leafy Ro, MD;  Location: [facility] ORS;  Service: General;  Laterality: Left;   POLYPECTOMY  08/23/2022   Procedure: POLYPECTOMY;  Surgeon: Jaynie Collins, DO;  Location: [facility] ENDOSCOPY;  Service: Gastroenterology;;   SUBMUCOSAL TATTOO INJECTION  08/23/2022   Procedure: SUBMUCOSAL TATTOO INJECTION;  Surgeon: Jaynie Collins, DO;  Location: [facility] ENDOSCOPY;  Service: Gastroenterology;;   TONSILLECTOMY      Family History  Problem Relation Age of Onset   Cancer Maternal Aunt    Prostate cancer Neg Hx    Kidney disease Neg Hx    Kidney cancer Neg Hx    Bladder Cancer Neg Hx     Social History:  reports that he has quit smoking. His smoking use included cigarettes. He has been exposed to tobacco smoke. He has never used smokeless tobacco. He reports that he does not drink alcohol and does not use drugs.  Allergies: No Known Allergies  Medications reviewed.    ROS Full ROS performed and is otherwise negative other than what is stated in HPI   BP (!) 160/80   Pulse 63   Temp 98.2 F (36.8 C)   Ht 6\' 1"  (1.854 m)   Wt 218 lb (98.9 kg)   SpO2 98%   BMI 28.76 kg/m   Physical Exam  Physical Exam Vitals and nursing note reviewed. Exam conducted with a chaperone present.  Cardiovascular:     Rate and Rhythm: Normal  rate.     Heart sounds: No murmur heard. Pulmonary:     Effort: Pulmonary effort is normal. No respiratory distress.     Breath sounds: No stridor. No wheezing.  Abdominal:     General: Abdomen is flat. There is no distension.     Palpations: Abdomen is soft. There is no mass.     Tenderness: There is no abdominal tenderness. There is no guarding.     Hernia: A hernia is present.     Comments: Umbilical hernia 2 cms  Musculoskeletal:        General: Normal range of motion.     Cervical back: Normal range of motion and neck supple. No rigidity or tenderness.  Lymphadenopathy:     Cervical: No cervical adenopathy.  Skin:    General: Skin is warm.  Capillary Refill: Capillary refill takes less than 2 seconds.     Coloration: Skin is not jaundiced.     Comments: Open wound left upper thigh and left groin, helaing well.  Very superficial and w significant improvement. no evidence of drainable collections  Neurological:     General: No focal deficit present.     Mental Status: He is oriented to person, place, and time.  Psychiatric:        Mood and Affect: Mood normal.        Behavior: Behavior normal.        Thought Content: Thought content normal.        Judgment: Judgment normal.      Assessment/Plan: 77 year old male with multiple comorbidities , he is here for an unresectable hepatic flexure near obstructing mass.  Although pathology does not show carcinoma endoscopic findings are very concerning.  Given the endoscopic findings I do recommend that is prudent to treat this as cancer.  I do recommend a lap right colectomy.  I had a discussion with the patient in detail regarding his disease process.  I do think that we can do this minimally invasive with a hand-assisted.  We will plan to perform hand-assisted right colectomy next  week as he has been optimized from medical perspective.  We will be able to fix ventral defect as well.  Procedure discussed with the patient in detail.   Risk, benefits and possible implications including but not limited to, bleeding, infection anastomotic leak, the need for ostomy.  He understands and is in agreement.  Please note that I spent greater than 40 minutes in this encounter including personally reviewing imaging studies, coordinating his care, placing orders and performing documentation.  Please note that this encounter was not related to recent procedure as this is encounter mainly focused on the unresectable colon mass.    No results found.  Assessment/Plan:  Sterling Big, MD [facility] General Surgeon

## 2022-09-23 ENCOUNTER — Telehealth: Payer: Self-pay

## 2022-09-23 ENCOUNTER — Other Ambulatory Visit: Payer: Self-pay

## 2022-09-23 ENCOUNTER — Inpatient Hospital Stay: Payer: No Typology Code available for payment source

## 2022-09-23 DIAGNOSIS — D649 Anemia, unspecified: Secondary | ICD-10-CM

## 2022-09-23 NOTE — Telephone Encounter (Signed)
-----   Message from [name] sent at 09/22/2022  4:47 PM EDT ----- Surgery is scheduled on 9/3, please arrange him to see me 2-3 weeks after surgery, lab MD + Venofer  cbc cmp iron tibc ferritin retic panel. Thanks.  zy

## 2022-09-24 ENCOUNTER — Other Ambulatory Visit: Payer: No Typology Code available for payment source

## 2022-09-24 ENCOUNTER — Other Ambulatory Visit: Payer: Self-pay

## 2022-09-24 ENCOUNTER — Encounter: Payer: Self-pay | Admitting: Surgery

## 2022-09-24 ENCOUNTER — Encounter: Payer: No Typology Code available for payment source | Admitting: Orthopedic Surgery

## 2022-09-28 ENCOUNTER — Ambulatory Visit: Payer: No Typology Code available for payment source | Admitting: Oncology

## 2022-09-28 ENCOUNTER — Inpatient Hospital Stay: Payer: No Typology Code available for payment source | Admitting: Urgent Care

## 2022-09-28 ENCOUNTER — Encounter
Admission: RE | Disposition: A | Discharge status: Home / Self Care | Payer: Self-pay | Source: Home / Self Care | Attending: Surgery

## 2022-09-28 ENCOUNTER — Other Ambulatory Visit: Payer: Self-pay

## 2022-09-28 ENCOUNTER — Ambulatory Visit: Payer: No Typology Code available for payment source

## 2022-09-28 ENCOUNTER — Inpatient Hospital Stay
Admission: RE | Admit: 2022-09-28 | Discharge: 2022-10-06 | DRG: 330 | Disposition: A | Discharge status: Home / Self Care | Payer: No Typology Code available for payment source | Attending: Surgery | Admitting: Surgery

## 2022-09-28 ENCOUNTER — Encounter: Payer: Self-pay | Admitting: Surgery

## 2022-09-28 DIAGNOSIS — E876 Hypokalemia: Secondary | ICD-10-CM | POA: Diagnosis not present

## 2022-09-28 DIAGNOSIS — K6389 Other specified diseases of intestine: Secondary | ICD-10-CM | POA: Diagnosis present

## 2022-09-28 DIAGNOSIS — C9110 Chronic lymphocytic leukemia of B-cell type not having achieved remission: Secondary | ICD-10-CM | POA: Diagnosis present

## 2022-09-28 DIAGNOSIS — N183 Chronic kidney disease, stage 3 unspecified: Secondary | ICD-10-CM | POA: Diagnosis not present

## 2022-09-28 DIAGNOSIS — D509 Iron deficiency anemia, unspecified: Secondary | ICD-10-CM | POA: Diagnosis present

## 2022-09-28 DIAGNOSIS — F1190 Opioid use, unspecified, uncomplicated: Secondary | ICD-10-CM | POA: Diagnosis present

## 2022-09-28 DIAGNOSIS — Z7984 Long term (current) use of oral hypoglycemic drugs: Secondary | ICD-10-CM

## 2022-09-28 DIAGNOSIS — Z79891 Long term (current) use of opiate analgesic: Secondary | ICD-10-CM | POA: Diagnosis not present

## 2022-09-28 DIAGNOSIS — E11649 Type 2 diabetes mellitus with hypoglycemia without coma: Secondary | ICD-10-CM | POA: Diagnosis not present

## 2022-09-28 DIAGNOSIS — M5412 Radiculopathy, cervical region: Secondary | ICD-10-CM

## 2022-09-28 DIAGNOSIS — Z01818 Encounter for other preprocedural examination: Secondary | ICD-10-CM

## 2022-09-28 DIAGNOSIS — Z1152 Encounter for screening for COVID-19: Secondary | ICD-10-CM

## 2022-09-28 DIAGNOSIS — I509 Heart failure, unspecified: Secondary | ICD-10-CM | POA: Diagnosis not present

## 2022-09-28 DIAGNOSIS — I10 Essential (primary) hypertension: Secondary | ICD-10-CM | POA: Diagnosis present

## 2022-09-28 DIAGNOSIS — M542 Cervicalgia: Secondary | ICD-10-CM

## 2022-09-28 DIAGNOSIS — E039 Hypothyroidism, unspecified: Secondary | ICD-10-CM | POA: Diagnosis present

## 2022-09-28 DIAGNOSIS — K436 Other and unspecified ventral hernia with obstruction, without gangrene: Secondary | ICD-10-CM | POA: Diagnosis not present

## 2022-09-28 DIAGNOSIS — E785 Hyperlipidemia, unspecified: Secondary | ICD-10-CM | POA: Diagnosis present

## 2022-09-28 DIAGNOSIS — M5416 Radiculopathy, lumbar region: Secondary | ICD-10-CM

## 2022-09-28 DIAGNOSIS — Z87891 Personal history of nicotine dependence: Secondary | ICD-10-CM | POA: Diagnosis not present

## 2022-09-28 DIAGNOSIS — Z8616 Personal history of COVID-19: Secondary | ICD-10-CM

## 2022-09-28 DIAGNOSIS — G894 Chronic pain syndrome: Secondary | ICD-10-CM | POA: Diagnosis not present

## 2022-09-28 DIAGNOSIS — K439 Ventral hernia without obstruction or gangrene: Secondary | ICD-10-CM | POA: Diagnosis not present

## 2022-09-28 DIAGNOSIS — E1122 Type 2 diabetes mellitus with diabetic chronic kidney disease: Secondary | ICD-10-CM | POA: Diagnosis not present

## 2022-09-28 DIAGNOSIS — Z79890 Hormone replacement therapy (postmenopausal): Secondary | ICD-10-CM | POA: Diagnosis not present

## 2022-09-28 DIAGNOSIS — I130 Hypertensive heart and chronic kidney disease with heart failure and stage 1 through stage 4 chronic kidney disease, or unspecified chronic kidney disease: Secondary | ICD-10-CM | POA: Diagnosis present

## 2022-09-28 DIAGNOSIS — Z419 Encounter for procedure for purposes other than remedying health state, unspecified: Principal | ICD-10-CM

## 2022-09-28 DIAGNOSIS — N400 Enlarged prostate without lower urinary tract symptoms: Secondary | ICD-10-CM | POA: Diagnosis present

## 2022-09-28 DIAGNOSIS — N1831 Chronic kidney disease, stage 3a: Secondary | ICD-10-CM | POA: Diagnosis not present

## 2022-09-28 DIAGNOSIS — E669 Obesity, unspecified: Secondary | ICD-10-CM | POA: Diagnosis present

## 2022-09-28 DIAGNOSIS — D122 Benign neoplasm of ascending colon: Principal | ICD-10-CM | POA: Diagnosis present

## 2022-09-28 DIAGNOSIS — G8929 Other chronic pain: Secondary | ICD-10-CM

## 2022-09-28 DIAGNOSIS — E66811 Obesity, class 1: Secondary | ICD-10-CM

## 2022-09-28 DIAGNOSIS — I2510 Atherosclerotic heart disease of native coronary artery without angina pectoris: Secondary | ICD-10-CM | POA: Diagnosis not present

## 2022-09-28 DIAGNOSIS — N179 Acute kidney failure, unspecified: Secondary | ICD-10-CM | POA: Diagnosis present

## 2022-09-28 DIAGNOSIS — K567 Ileus, unspecified: Secondary | ICD-10-CM | POA: Diagnosis not present

## 2022-09-28 DIAGNOSIS — U071 COVID-19: Secondary | ICD-10-CM

## 2022-09-28 DIAGNOSIS — M48062 Spinal stenosis, lumbar region with neurogenic claudication: Secondary | ICD-10-CM

## 2022-09-28 DIAGNOSIS — I517 Cardiomegaly: Secondary | ICD-10-CM | POA: Diagnosis not present

## 2022-09-28 HISTORY — DX: Other cervical disc degeneration, unspecified cervical region: M50.30

## 2022-09-28 HISTORY — DX: Long term (current) use of aspirin: Z79.82

## 2022-09-28 HISTORY — DX: Atherosclerosis of aorta: I70.0

## 2022-09-28 HISTORY — DX: Hypothyroidism, unspecified: E03.9

## 2022-09-28 HISTORY — PX: VENTRAL HERNIA REPAIR: SHX424

## 2022-09-28 HISTORY — DX: Diverticulosis of intestine, part unspecified, without perforation or abscess without bleeding: K57.90

## 2022-09-28 HISTORY — DX: Spinal stenosis, lumbar region without neurogenic claudication: M48.061

## 2022-09-28 HISTORY — DX: Solitary pulmonary nodule: R91.1

## 2022-09-28 HISTORY — DX: Benign neoplasm of colon, unspecified: D12.6

## 2022-09-28 HISTORY — DX: Benign neoplasm of left adrenal gland: D35.02

## 2022-09-28 HISTORY — DX: Ventral hernia without obstruction or gangrene: K43.9

## 2022-09-28 HISTORY — DX: Opioid use, unspecified, uncomplicated: F11.90

## 2022-09-28 HISTORY — DX: Bradycardia, unspecified: R00.1

## 2022-09-28 HISTORY — DX: Chronic pain syndrome: G89.4

## 2022-09-28 HISTORY — PX: LAPAROSCOPIC RIGHT COLECTOMY: SHX5925

## 2022-09-28 LAB — GLUCOSE, CAPILLARY
Glucose-Capillary: 92 mg/dL (ref 70–99)
Glucose-Capillary: 151 mg/dL — ABNORMAL HIGH (ref 70–99)
Glucose-Capillary: 146 mg/dL — ABNORMAL HIGH (ref 70–99)

## 2022-09-28 LAB — CBC
HCT: 33.8 % — ABNORMAL LOW (ref 39.0–52.0)
Hemoglobin: 10.5 g/dL — ABNORMAL LOW (ref 13.0–17.0)
MCH: 29.4 pg (ref 26.0–34.0)
MCHC: 31.1 g/dL (ref 30.0–36.0)
MCV: 94.7 fL (ref 80.0–100.0)
Platelets: 138 10*3/uL — ABNORMAL LOW (ref 150–400)
RBC: 3.57 MIL/uL — ABNORMAL LOW (ref 4.22–5.81)
RDW: 22 % — ABNORMAL HIGH (ref 11.5–15.5)
WBC: 11.2 10*3/uL — ABNORMAL HIGH (ref 4.0–10.5)
nRBC: 0 % (ref 0.0–0.2)

## 2022-09-28 LAB — CREATININE, SERUM
Creatinine, Ser: 1.2 mg/dL (ref 0.61–1.24)
GFR, Estimated: >60 mL/min (ref >60)

## 2022-09-28 SURGERY — COLECTOMY, RIGHT, LAPAROSCOPIC
Anesthesia: General | Laterality: Right

## 2022-09-28 MED ORDER — OXYCODONE HCL 5 MG PO TABS
ORAL_TABLET | ORAL | Status: AC
  Filled 2022-09-28: qty 1

## 2022-09-28 MED ORDER — PROPOFOL 10 MG/ML IV BOLUS
INTRAVENOUS | Status: DC | PRN
  Administered 2022-09-28: 20 mg via INTRAVENOUS
  Administered 2022-09-28: 150 mg via INTRAVENOUS

## 2022-09-28 MED ORDER — ENOXAPARIN SODIUM 40 MG/0.4ML IJ SOSY
40.0000 mg | PREFILLED_SYRINGE | INTRAMUSCULAR | Status: DC
  Administered 2022-09-29 – 2022-10-06 (×8): 40 mg via SUBCUTANEOUS
  Filled 2022-09-28 (×6): qty 0.4

## 2022-09-28 MED ORDER — FENTANYL CITRATE (PF) 100 MCG/2ML IJ SOLN
INTRAMUSCULAR | Status: AC
  Filled 2022-09-28: qty 2

## 2022-09-28 MED ORDER — FENTANYL CITRATE (PF) 100 MCG/2ML IJ SOLN
25.0000 ug | INTRAMUSCULAR | Status: AC | PRN
  Administered 2022-09-28: 25 ug via INTRAVENOUS
  Administered 2022-09-28: 50 ug via INTRAVENOUS
  Administered 2022-09-28 (×3): 25 ug via INTRAVENOUS
  Administered 2022-09-28: 50 ug via INTRAVENOUS

## 2022-09-28 MED ORDER — SODIUM CHLORIDE (PF) 0.9 % IJ SOLN
INTRAMUSCULAR | Status: AC
  Filled 2022-09-28: qty 50

## 2022-09-28 MED ORDER — BUPIVACAINE LIPOSOME 1.3 % IJ SUSP
INTRAMUSCULAR | Status: AC
  Filled 2022-09-28: qty 20

## 2022-09-28 MED ORDER — ALVIMOPAN 12 MG PO CAPS
ORAL_CAPSULE | ORAL | Status: AC
  Filled 2022-09-28: qty 1

## 2022-09-28 MED ORDER — OXYCODONE HCL 5 MG PO TABS
5.0000 mg | ORAL_TABLET | Freq: Once | ORAL | Status: AC | PRN
  Administered 2022-09-28: 5 mg via ORAL

## 2022-09-28 MED ORDER — DIPHENHYDRAMINE HCL 50 MG/ML IJ SOLN: 12.5000 mg | Freq: Four times a day (QID) | INTRAMUSCULAR | Status: DC | PRN

## 2022-09-28 MED ORDER — OXYCODONE HCL 5 MG/5ML PO SOLN: 5.0000 mg | Freq: Once | ORAL | Status: AC | PRN

## 2022-09-28 MED ORDER — ACETAMINOPHEN 500 MG PO TABS
ORAL_TABLET | ORAL | Status: AC
  Filled 2022-09-28: qty 2

## 2022-09-28 MED ORDER — EPINEPHRINE PF 1 MG/ML IJ SOLN
INTRAMUSCULAR | Status: AC
  Filled 2022-09-28: qty 1

## 2022-09-28 MED ORDER — FENTANYL CITRATE (PF) 100 MCG/2ML IJ SOLN
INTRAMUSCULAR | Status: DC | PRN
  Administered 2022-09-28 (×2): 50 ug via INTRAVENOUS

## 2022-09-28 MED ORDER — OXYCODONE HCL 5 MG PO TABS
5.0000 mg | ORAL_TABLET | ORAL | Status: DC | PRN
  Administered 2022-09-28 – 2022-09-29 (×3): 5 mg via ORAL
  Administered 2022-09-29 – 2022-10-06 (×5): 10 mg via ORAL
  Filled 2022-09-28 (×4): qty 2

## 2022-09-28 MED ORDER — ORAL CARE MOUTH RINSE: 15.0000 mL | Freq: Once | OROMUCOSAL | Status: AC

## 2022-09-28 MED ORDER — CHLORHEXIDINE GLUCONATE CLOTH 2 % EX PADS: 6.0000 | MEDICATED_PAD | Freq: Once | CUTANEOUS | Status: DC

## 2022-09-28 MED ORDER — INSULIN ASPART 100 UNIT/ML IJ SOLN
4.0000 [IU] | Freq: Three times a day (TID) | INTRAMUSCULAR | Status: DC
  Administered 2022-10-01 (×3): 4 [IU] via SUBCUTANEOUS
  Filled 2022-09-28 (×3): qty 1

## 2022-09-28 MED ORDER — PROPOFOL 10 MG/ML IV BOLUS
INTRAVENOUS | Status: AC
  Filled 2022-09-28: qty 20

## 2022-09-28 MED ORDER — LACTATED RINGERS IV SOLN: INTRAVENOUS | Status: DC

## 2022-09-28 MED ORDER — SODIUM CHLORIDE (PF) 0.9 % IJ SOLN
INTRAMUSCULAR | Status: DC | PRN
  Administered 2022-09-28: 100 mL via INTRAMUSCULAR

## 2022-09-28 MED ORDER — MIDAZOLAM HCL 2 MG/2ML IJ SOLN
INTRAMUSCULAR | Status: DC | PRN
  Administered 2022-09-28: 2 mg via INTRAVENOUS

## 2022-09-28 MED ORDER — DEXAMETHASONE SODIUM PHOSPHATE 10 MG/ML IJ SOLN
INTRAMUSCULAR | Status: DC | PRN
  Administered 2022-09-28: 5 mg via INTRAVENOUS

## 2022-09-28 MED ORDER — ORAL CARE MOUTH RINSE: 15.0000 mL | Freq: Once | OROMUCOSAL | Status: DC

## 2022-09-28 MED ORDER — EPHEDRINE SULFATE (PRESSORS) 50 MG/ML IJ SOLN
INTRAMUSCULAR | Status: DC | PRN
Start: 2022-09-28 — End: 2022-09-28
  Administered 2022-09-28: 5 mg via INTRAVENOUS

## 2022-09-28 MED ORDER — ONDANSETRON 4 MG PO TBDP: 4.0000 mg | ORAL_TABLET | Freq: Four times a day (QID) | ORAL | Status: DC | PRN

## 2022-09-28 MED ORDER — METFORMIN HCL 500 MG PO TABS
500.0000 mg | ORAL_TABLET | Freq: Two times a day (BID) | ORAL | Status: DC
  Administered 2022-09-29 – 2022-10-01 (×5): 500 mg via ORAL
  Filled 2022-09-28 (×2): qty 1

## 2022-09-28 MED ORDER — CHLORHEXIDINE GLUCONATE 0.12 % MT SOLN
15.0000 mL | Freq: Once | OROMUCOSAL | Status: AC
  Administered 2022-09-28: 15 mL via OROMUCOSAL

## 2022-09-28 MED ORDER — ACETAMINOPHEN 500 MG PO TABS
1000.0000 mg | ORAL_TABLET | Freq: Four times a day (QID) | ORAL | Status: DC
  Administered 2022-09-29 – 2022-10-06 (×25): 1000 mg via ORAL
  Filled 2022-09-28 (×24): qty 2

## 2022-09-28 MED ORDER — SODIUM CHLORIDE 0.9 % IV SOLN: INTRAVENOUS | Status: DC

## 2022-09-28 MED ORDER — KETOROLAC TROMETHAMINE 15 MG/ML IJ SOLN
INTRAMUSCULAR | Status: AC
  Filled 2022-09-28: qty 1

## 2022-09-28 MED ORDER — BUPIVACAINE HCL (PF) 0.5 % IJ SOLN
INTRAMUSCULAR | Status: AC
  Filled 2022-09-28: qty 30

## 2022-09-28 MED ORDER — ONDANSETRON HCL 4 MG/2ML IJ SOLN
4.0000 mg | Freq: Four times a day (QID) | INTRAMUSCULAR | Status: DC | PRN
  Administered 2022-10-01: 4 mg via INTRAVENOUS
  Filled 2022-09-28: qty 2

## 2022-09-28 MED ORDER — ONDANSETRON HCL 4 MG/2ML IJ SOLN
INTRAMUSCULAR | Status: AC
  Filled 2022-09-28: qty 2

## 2022-09-28 MED ORDER — HYDROMORPHONE HCL 1 MG/ML IJ SOLN
INTRAMUSCULAR | Status: DC | PRN
Start: 2022-09-28 — End: 2022-09-28
  Administered 2022-09-28 (×3): .5 mg via INTRAVENOUS

## 2022-09-28 MED ORDER — GABAPENTIN 300 MG PO CAPS
ORAL_CAPSULE | ORAL | Status: AC
  Filled 2022-09-28: qty 1

## 2022-09-28 MED ORDER — LEVOTHYROXINE SODIUM 100 MCG PO TABS
200.0000 ug | ORAL_TABLET | Freq: Every day | ORAL | Status: DC
  Administered 2022-09-29 – 2022-10-06 (×8): 200 ug via ORAL
  Filled 2022-09-28: qty 1
  Filled 2022-09-28 (×6): qty 2
  Filled 2022-09-28: qty 1

## 2022-09-28 MED ORDER — CHLORHEXIDINE GLUCONATE 0.12 % MT SOLN: 15.0000 mL | Freq: Once | OROMUCOSAL | Status: DC

## 2022-09-28 MED ORDER — HYDRALAZINE HCL 50 MG PO TABS
50.0000 mg | ORAL_TABLET | Freq: Two times a day (BID) | ORAL | Status: DC
  Administered 2022-09-28 – 2022-10-02 (×9): 50 mg via ORAL
  Filled 2022-09-28 (×5): qty 1

## 2022-09-28 MED ORDER — KETOROLAC TROMETHAMINE 15 MG/ML IJ SOLN
15.0000 mg | Freq: Four times a day (QID) | INTRAMUSCULAR | Status: DC | PRN
  Administered 2022-09-28 – 2022-09-29 (×2): 15 mg via INTRAVENOUS

## 2022-09-28 MED ORDER — ACETAMINOPHEN 500 MG PO TABS
1000.0000 mg | ORAL_TABLET | ORAL | Status: AC
  Administered 2022-09-28: 1000 mg via ORAL

## 2022-09-28 MED ORDER — HYDRALAZINE HCL 20 MG/ML IJ SOLN
10.0000 mg | INTRAMUSCULAR | Status: DC | PRN
  Administered 2022-09-29 – 2022-09-30 (×2): 10 mg via INTRAVENOUS

## 2022-09-28 MED ORDER — DEXAMETHASONE SODIUM PHOSPHATE 10 MG/ML IJ SOLN
INTRAMUSCULAR | Status: AC
  Filled 2022-09-28: qty 1

## 2022-09-28 MED ORDER — METHOCARBAMOL 500 MG PO TABS: 500.0000 mg | ORAL_TABLET | Freq: Three times a day (TID) | ORAL | Status: DC | PRN

## 2022-09-28 MED ORDER — METHOCARBAMOL 1000 MG/10ML IJ SOLN
500.0000 mg | Freq: Three times a day (TID) | INTRAVENOUS | Status: DC | PRN
  Filled 2022-09-28: qty 5

## 2022-09-28 MED ORDER — HEPARIN SODIUM (PORCINE) 5000 UNIT/ML IJ SOLN
INTRAMUSCULAR | Status: AC
  Filled 2022-09-28: qty 1

## 2022-09-28 MED ORDER — SODIUM CHLORIDE 0.9 % IV SOLN
2.0000 g | Freq: Three times a day (TID) | INTRAVENOUS | Status: AC
  Administered 2022-09-28 – 2022-09-29 (×3): 2 g via INTRAVENOUS
  Filled 2022-09-28: qty 2

## 2022-09-28 MED ORDER — DIPHENHYDRAMINE HCL 12.5 MG/5ML PO ELIX: 12.5000 mg | ORAL_SOLUTION | Freq: Four times a day (QID) | ORAL | Status: DC | PRN

## 2022-09-28 MED ORDER — PROPOFOL 10 MG/ML IV BOLUS
INTRAVENOUS | Status: AC
  Filled 2022-09-28: qty 40

## 2022-09-28 MED ORDER — SODIUM CHLORIDE 0.9 % IV SOLN
2.0000 g | INTRAVENOUS | Status: AC
  Administered 2022-09-28: 2 g via INTRAVENOUS

## 2022-09-28 MED ORDER — SUGAMMADEX SODIUM 200 MG/2ML IV SOLN
INTRAVENOUS | Status: DC | PRN
  Administered 2022-09-28: 200 mg via INTRAVENOUS

## 2022-09-28 MED ORDER — HYDRALAZINE HCL 20 MG/ML IJ SOLN
10.0000 mg | Freq: Once | INTRAMUSCULAR | Status: AC
  Administered 2022-09-28: 10 mg via INTRAVENOUS

## 2022-09-28 MED ORDER — HYDROMORPHONE HCL 1 MG/ML IJ SOLN
INTRAMUSCULAR | Status: AC
  Filled 2022-09-28: qty 1

## 2022-09-28 MED ORDER — ONDANSETRON HCL 4 MG/2ML IJ SOLN
INTRAMUSCULAR | Status: DC | PRN
  Administered 2022-09-28: 4 mg via INTRAVENOUS

## 2022-09-28 MED ORDER — LIDOCAINE HCL (CARDIAC) PF 100 MG/5ML IV SOSY
PREFILLED_SYRINGE | INTRAVENOUS | Status: DC | PRN
  Administered 2022-09-28: 60 mg via INTRAVENOUS

## 2022-09-28 MED ORDER — HYDRALAZINE HCL 50 MG PO TABS
ORAL_TABLET | ORAL | Status: AC
  Filled 2022-09-28: qty 1

## 2022-09-28 MED ORDER — PANTOPRAZOLE SODIUM 40 MG IV SOLR
40.0000 mg | Freq: Every day | INTRAVENOUS | Status: DC
  Administered 2022-09-28 – 2022-10-05 (×8): 40 mg via INTRAVENOUS
  Filled 2022-09-28 (×6): qty 10

## 2022-09-28 MED ORDER — SODIUM CHLORIDE 0.9 % IV SOLN
INTRAVENOUS | Status: AC
  Filled 2022-09-28: qty 2

## 2022-09-28 MED ORDER — ALVIMOPAN 12 MG PO CAPS
12.0000 mg | ORAL_CAPSULE | ORAL | Status: AC
  Administered 2022-09-28: 12 mg via ORAL

## 2022-09-28 MED ORDER — PANTOPRAZOLE SODIUM 40 MG IV SOLR
INTRAVENOUS | Status: AC
  Filled 2022-09-28: qty 10

## 2022-09-28 MED ORDER — LACTATED RINGERS IV SOLN
INTRAVENOUS | Status: DC | PRN
Start: 2022-09-28 — End: 2022-09-28

## 2022-09-28 MED ORDER — ROCURONIUM BROMIDE 100 MG/10ML IV SOLN
INTRAVENOUS | Status: DC | PRN
  Administered 2022-09-28: 10 mg via INTRAVENOUS
  Administered 2022-09-28: 20 mg via INTRAVENOUS
  Administered 2022-09-28: 30 mg via INTRAVENOUS
  Administered 2022-09-28: 50 mg via INTRAVENOUS

## 2022-09-28 MED ORDER — ALVIMOPAN 12 MG PO CAPS: 12.0000 mg | ORAL_CAPSULE | ORAL | Status: DC

## 2022-09-28 MED ORDER — METOPROLOL SUCCINATE ER 25 MG PO TB24
25.0000 mg | ORAL_TABLET | Freq: Every day | ORAL | Status: DC
  Administered 2022-09-29 – 2022-10-06 (×8): 25 mg via ORAL
  Filled 2022-09-28 (×9): qty 1

## 2022-09-28 MED ORDER — LABETALOL HCL 5 MG/ML IV SOLN
INTRAVENOUS | Status: AC
  Filled 2022-09-28: qty 4

## 2022-09-28 MED ORDER — FAMOTIDINE 20 MG PO TABS
20.0000 mg | ORAL_TABLET | Freq: Once | ORAL | Status: AC
  Administered 2022-09-28: 20 mg via ORAL

## 2022-09-28 MED ORDER — HEPARIN SODIUM (PORCINE) 5000 UNIT/ML IJ SOLN
5000.0000 [IU] | Freq: Once | INTRAMUSCULAR | Status: AC
  Administered 2022-09-28: 5000 [IU] via SUBCUTANEOUS

## 2022-09-28 MED ORDER — LABETALOL HCL 5 MG/ML IV SOLN
5.0000 mg | INTRAVENOUS | Status: AC | PRN
  Administered 2022-09-28 (×3): 5 mg via INTRAVENOUS

## 2022-09-28 MED ORDER — FAMOTIDINE 20 MG PO TABS
ORAL_TABLET | ORAL | Status: AC
  Filled 2022-09-28: qty 1

## 2022-09-28 MED ORDER — MORPHINE SULFATE (PF) 4 MG/ML IV SOLN
4.0000 mg | INTRAVENOUS | Status: DC | PRN
  Administered 2022-09-29 – 2022-10-02 (×7): 4 mg via INTRAVENOUS
  Filled 2022-09-28 (×5): qty 1

## 2022-09-28 MED ORDER — INSULIN ASPART 100 UNIT/ML IJ SOLN: 0.0000 [IU] | Freq: Every day | INTRAMUSCULAR | Status: DC

## 2022-09-28 MED ORDER — CHLORHEXIDINE GLUCONATE 0.12 % MT SOLN
OROMUCOSAL | Status: AC
  Filled 2022-09-28: qty 15

## 2022-09-28 MED ORDER — MIDAZOLAM HCL 2 MG/2ML IJ SOLN
INTRAMUSCULAR | Status: AC
  Filled 2022-09-28: qty 2

## 2022-09-28 MED ORDER — INSULIN ASPART 100 UNIT/ML IJ SOLN
0.0000 [IU] | Freq: Three times a day (TID) | INTRAMUSCULAR | Status: DC
  Administered 2022-10-01: 3 [IU] via SUBCUTANEOUS
  Administered 2022-10-02 – 2022-10-03 (×2): 2 [IU] via SUBCUTANEOUS
  Administered 2022-10-03: 3 [IU] via SUBCUTANEOUS
  Administered 2022-10-03: 2 [IU] via SUBCUTANEOUS
  Administered 2022-10-04: 5 [IU] via SUBCUTANEOUS
  Administered 2022-10-04: 2 [IU] via SUBCUTANEOUS
  Administered 2022-10-05: 3 [IU] via SUBCUTANEOUS
  Filled 2022-09-28 (×8): qty 1

## 2022-09-28 MED ORDER — GLYCOPYRROLATE 0.2 MG/ML IJ SOLN
INTRAMUSCULAR | Status: DC | PRN
  Administered 2022-09-28: .2 mg via INTRAVENOUS

## 2022-09-28 MED ORDER — GABAPENTIN 300 MG PO CAPS
300.0000 mg | ORAL_CAPSULE | ORAL | Status: AC
  Administered 2022-09-28: 300 mg via ORAL

## 2022-09-28 MED ORDER — HYDRALAZINE HCL 20 MG/ML IJ SOLN
INTRAMUSCULAR | Status: AC
  Filled 2022-09-28: qty 1

## 2022-09-28 SURGICAL SUPPLY — 75 items
ADH SKN CLS APL DERMABOND .7 (GAUZE/BANDAGES/DRESSINGS)
APL PRP STRL LF DISP 70% ISPRP (MISCELLANEOUS) ×2
APPLIER CLIP 11 MED OPEN (CLIP)
APPLIER CLIP 13 LRG OPEN (CLIP)
APR CLP LRG 13 20 CLIP (CLIP)
APR CLP MED 11 20 MLT OPN (CLIP)
BLADE CLIPPER SURG (BLADE) ×2 IMPLANT
BLADE SURG SZ10 CARB STEEL (BLADE) ×2 IMPLANT
CHLORAPREP W/TINT 26 (MISCELLANEOUS) ×2 IMPLANT
CLIP APPLIE 11 MED OPEN (CLIP) ×2 IMPLANT
CLIP APPLIE 13 LRG OPEN (CLIP) ×2 IMPLANT
DERMABOND ADVANCED .7 DNX12 (GAUZE/BANDAGES/DRESSINGS) ×4 IMPLANT
DRAPE INCISE IOBAN 66X45 STRL (DRAPES) ×2 IMPLANT
DRAPE LAPAROTOMY 100X77 ABD (DRAPES) ×2 IMPLANT
DRSG TELFA 3X8 NADH STRL (GAUZE/BANDAGES/DRESSINGS) ×2 IMPLANT
ELECT CAUTERY BLADE 6.4 (BLADE) ×4 IMPLANT
ELECT REM PT RETURN 9FT ADLT (ELECTROSURGICAL) ×2
ELECTRODE REM PT RTRN 9FT ADLT (ELECTROSURGICAL) ×2 IMPLANT
GAUZE 4X4 16PLY ~~LOC~~+RFID DBL (SPONGE) ×2 IMPLANT
GAUZE SPONGE 4X4 12PLY STRL (GAUZE/BANDAGES/DRESSINGS) ×2 IMPLANT
GLOVE BIO SURGEON STRL SZ7 (GLOVE) ×6 IMPLANT
GOWN STRL REUS W/ TWL LRG LVL3 (GOWN DISPOSABLE) ×8 IMPLANT
GOWN STRL REUS W/TWL LRG LVL3 (GOWN DISPOSABLE) ×8
HANDLE SUCTION POOLE (INSTRUMENTS) ×2 IMPLANT
HANDLE YANKAUER SUCT BULB TIP (MISCELLANEOUS) ×2 IMPLANT
HOLDER FOLEY CATH W/STRAP (MISCELLANEOUS) ×2 IMPLANT
MANIFOLD NEPTUNE II (INSTRUMENTS) ×2 IMPLANT
NDL HYPO 22X1.5 SAFETY MO (MISCELLANEOUS) ×2 IMPLANT
NDL HYPO 25X1 1.5 SAFETY (NEEDLE) ×2 IMPLANT
NEEDLE HYPO 22X1.5 SAFETY MO (MISCELLANEOUS) ×2 IMPLANT
NEEDLE HYPO 25X1 1.5 SAFETY (NEEDLE) ×2 IMPLANT
NS IRRIG 1000ML POUR BTL (IV SOLUTION) ×2 IMPLANT
PACK BASIN MINOR ARMC (MISCELLANEOUS) ×2 IMPLANT
PACK COLON CLEAN CLOSURE (MISCELLANEOUS) ×2 IMPLANT
PACK LAP CHOLECYSTECTOMY (MISCELLANEOUS) ×2 IMPLANT
PENCIL SMOKE EVACUATOR (MISCELLANEOUS) ×2 IMPLANT
RELOAD PROXIMATE 75MM BLUE (ENDOMECHANICALS) ×6 IMPLANT
RELOAD STAPLE 60 2.6 WHT THN (STAPLE) IMPLANT
RELOAD STAPLE 75 3.8 BLU REG (ENDOMECHANICALS) IMPLANT
RELOAD STAPLER WHITE 60MM (STAPLE) ×2 IMPLANT
RETRACTOR WOUND ALXS 18CM MED (MISCELLANEOUS) IMPLANT
RTRCTR WOUND ALEXIS O 18CM MED (MISCELLANEOUS) ×2
SHEARS HARMONIC ACE PLUS 36CM (ENDOMECHANICALS) ×2 IMPLANT
SLEEVE Z-THREAD 5X100MM (TROCAR) ×2 IMPLANT
SPIKE FLUID TRANSFER (MISCELLANEOUS) ×2 IMPLANT
SPONGE T-LAP 18X18 ~~LOC~~+RFID (SPONGE) ×6 IMPLANT
SPONGE T-LAP 18X36 ~~LOC~~+RFID STR (SPONGE) ×2 IMPLANT
STAPLE ECHEON FLEX 60 POW ENDO (STAPLE) IMPLANT
STAPLER PROXIMATE 75MM BLUE (STAPLE) IMPLANT
STAPLER RELOAD WHITE 60MM (STAPLE) ×2
STAPLER SKIN PROX 35W (STAPLE) ×2 IMPLANT
SUCTION POOLE HANDLE (INSTRUMENTS)
SUT ETHIBOND 0 MO6 C/R (SUTURE) ×2 IMPLANT
SUT MNCRL 4-0 (SUTURE) ×2
SUT MNCRL 4-0 27XMFL (SUTURE) ×2
SUT PDS AB 0 CT1 27 (SUTURE) ×4 IMPLANT
SUT SILK 2 0 (SUTURE) ×2
SUT SILK 2 0 SH CR/8 (SUTURE) ×2 IMPLANT
SUT SILK 2-0 30XBRD TIE 12 (SUTURE) ×2 IMPLANT
SUT V-LOC 90 ABS 3-0 VLT V-20 (SUTURE) IMPLANT
SUT VIC AB 2-0 SH 27 (SUTURE) ×10
SUT VIC AB 2-0 SH 27XBRD (SUTURE) ×4 IMPLANT
SUT VICRYL 0 UR6 27IN ABS (SUTURE) ×4 IMPLANT
SUTURE MNCRL 4-0 27XMF (SUTURE) ×4 IMPLANT
SYR 20ML LL LF (SYRINGE) ×2 IMPLANT
SYS LAPSCP GELPORT 120MM (MISCELLANEOUS) ×2
SYSTEM LAPSCP GELPORT 120MM (MISCELLANEOUS) ×2 IMPLANT
TAPE MICROFOAM 4IN (TAPE) ×2 IMPLANT
TOWEL OR 17X26 4PK STRL BLUE (TOWEL DISPOSABLE) ×2 IMPLANT
TRAP FLUID SMOKE EVACUATOR (MISCELLANEOUS) ×2 IMPLANT
TRAY FOLEY MTR SLVR 16FR STAT (SET/KITS/TRAYS/PACK) ×2 IMPLANT
TROCAR Z-THREAD FIOS 5X100MM (TROCAR) ×2 IMPLANT
TUBING EVAC SMOKE HEATED PNEUM (TUBING) ×2 IMPLANT
WATER STERILE IRR 1000ML POUR (IV SOLUTION) ×2 IMPLANT
WATER STERILE IRR 500ML POUR (IV SOLUTION) ×2 IMPLANT

## 2022-09-28 NOTE — Anesthesia Preprocedure Evaluation (Signed)
Anesthesia Evaluation  Patient identified by MRN, date of birth, ID band Patient awake    Reviewed: Allergy & Precautions, NPO status , Patient's Chart, lab work & pertinent test results  History of Anesthesia Complications Negative for: history of anesthetic complications  Airway Mallampati: III  TM Distance: >3 FB Neck ROM: full    Dental  (+) Chipped, Poor Dentition, Missing   Pulmonary neg shortness of breath, sleep apnea , former smoker   Pulmonary exam normal        Cardiovascular Exercise Tolerance: Good hypertension, (-) angina + CAD and +CHF  (-) Past MI Normal cardiovascular exam+ dysrhythmias      Neuro/Psych  Headaches  Neuromuscular disease  negative psych ROS   GI/Hepatic negative GI ROS, Neg liver ROS,,,  Endo/Other  diabetes, Type 2Hypothyroidism    Renal/GU Renal disease     Musculoskeletal   Abdominal   Peds  Hematology negative hematology ROS (+)   Anesthesia Other Findings Past Medical History: No date: Adenoma of left adrenal gland No date: Aortic atherosclerosis (HCC) No date: BPH (benign prostatic hyperplasia) No date: Bradycardia 08/26/2022: CAD (coronary artery disease)     Comment:  a.) cCTA 08/26/2022: Ca2+ 14.3 (12th %'ile for               age/sex.race match control); (<25%) pLAD 08/2022: Cellulitis of left thigh No date: CHF (congestive heart failure) (HCC)     Comment:  a.) TTE 10/23/2015: EF 60-65%, mod-sev LVH, mild               biatrial dil, degen MV disease, AoV sclerosis, asc Ao 38               mm, G1DD; b.) TTE 08/05/2022: EF 45-50%, mod-sev LVH               (speckled pattern), sev biatrial dil, mild MR, AoV               sclerosis, G1DD No date: Chronic pain syndrome     Comment:  a.) on COT managed by pain management No date: Chronic, continuous use of opioids     Comment:  a.) chronic pain syndrome/chronic back pain; managed by               pain management No  date: CKD (chronic kidney disease) stage 3, GFR 30-59 ml/min (HCC) 12/28/2021: CLL (chronic lymphocytic leukemia) (HCC)     Comment:  a.) Rai stage III No date: DDD (degenerative disc disease), cervical 10/22/2015: Difficult airway     Comment:  a.) 1st attempt with glidescope  --> macroglossic               (unsuccessful); b.) 2nd attempt with Mac 4 and direct               laryngoscopy with a 8.0 ETT --> tube too large to pass               through the cords; b.) 3rd attempt (successful) with a               Mac 4 and a 7.5 tube No date: Diverticulosis No date: DM (diabetes mellitus), type 2 (HCC) No date: Dysplasia of prostate No date: Erectile dysfunction No date: Frequent falls 08/23/2022: Hepatic flexure mass     Comment:  a.) colonoscopy 08/23/2022: 14 x 15 mm partially               obstructing ulcerated distal ascending colon mass;  tissue              friable --> pathology resulted as tubular adenoma,               however felt to be false (-) --> referral to sugery and               colectomy recommended. 08/28/2022: History of MRSA infection     Comment:  a.) MRSA PCR (+) 08/28/2022; culture from LEFT thigh               abscess No date: HTN (hypertension) No date: Hyperlipidemia No date: Hypogonadism in male No date: Hypothyroidism No date: IDA (iron deficiency anemia) No date: LBBB (left bundle branch block) No date: Long-term use of aspirin therapy No date: Lumbar spinal stenosis No date: Mild cardiomegaly No date: Nodule of right lung 06/16/2013: Orchitis and epididymitis No date: OSA on CPAP 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, hand No date: Pericardial effusion No date: Prostatitis 08/2022: Sepsis (HCC) 10/22/2015: Subdural hematoma (HCC)     Comment:  a.) s/p traumatic mechanical fall --> CT head               10/22/2015: high-density SDH the left cerebral convexity               with13 mm midline shift --> s/p LEFT frontal burr hole                craniotomy No date: Tubular adenoma of colon No date: Ventral hernia  Past Surgical History: No date: BACK SURGERY 08/21/2022: BIOPSY     Comment:  Procedure: BIOPSY;  Surgeon: Jaynie Collins, DO;               Location: [facility] ENDOSCOPY;  Service: Gastroenterology;; 08/23/2022: BIOPSY     Comment:  Procedure: BIOPSY;  Surgeon: Jaynie Collins, DO;               Location: [facility] ENDOSCOPY;  Service: Gastroenterology;; 10/22/2015: Ines Bloomer HOLE OF CRANIUM; Left 08/23/2022: COLONOSCOPY WITH PROPOFOL; N/A     Comment:  Procedure: COLONOSCOPY WITH PROPOFOL;  Surgeon: Jaynie Collins, DO;  Location: [facility] ENDOSCOPY;  Service:               Gastroenterology;  Laterality: N/A; 08/21/2022: ESOPHAGOGASTRODUODENOSCOPY (EGD) WITH PROPOFOL; N/A     Comment:  Procedure: ESOPHAGOGASTRODUODENOSCOPY (EGD) WITH               PROPOFOL;  Surgeon: Jaynie Collins, DO;  Location:              [facility] ENDOSCOPY;  Service: Gastroenterology;  Laterality:               N/A; 08/23/2022: GIVENS CAPSULE STUDY; N/A     Comment:  Procedure: GIVENS CAPSULE STUDY;  Surgeon: Jaynie Collins, DO;  Location: [facility] ENDOSCOPY;  Service:               Gastroenterology;  Laterality: N/A; 08/28/2022: IRRIGATION AND DEBRIDEMENT ABSCESS; Left     Comment:  Procedure: IRRIGATION AND DEBRIDEMENT ABSCESS LEFT UPPER              THIGH/GROIN;  Surgeon: Leafy Ro, MD;  Location:               [facility] ORS;  Service: General;  Laterality: Left;  08/23/2022: POLYPECTOMY     Comment:  Procedure: POLYPECTOMY;  Surgeon: Jaynie Collins,              DO;  Location: [facility] ENDOSCOPY;  Service:               Gastroenterology;; 08/23/2022: SUBMUCOSAL TATTOO INJECTION     Comment:  Procedure: SUBMUCOSAL TATTOO INJECTION;  Surgeon: Jaynie Collins, DO;  Location: [facility] ENDOSCOPY;  Service:               Gastroenterology;; No date: TONSILLECTOMY  BMI    Body Mass  Index: 28.77 kg/m      Reproductive/Obstetrics negative OB ROS                             Anesthesia Physical Anesthesia Plan  ASA: 3  Anesthesia Plan: General ETT   Post-op Pain Management:    Induction: Intravenous  PONV Risk Score and Plan: Ondansetron, Dexamethasone, Midazolam and Treatment may vary due to age or medical condition  Airway Management Planned: Oral ETT  Additional Equipment:   Intra-op Plan:   Post-operative Plan: Extubation in OR  Informed Consent: I have reviewed the patients History and Physical, chart, labs and discussed the procedure including the risks, benefits and alternatives for the proposed anesthesia with the patient or authorized representative who has indicated his/her understanding and acceptance.     Dental Advisory Given  Plan Discussed with: Anesthesiologist, CRNA and Surgeon  Anesthesia Plan Comments: (Patient consented for risks of anesthesia including but not limited to:  - adverse reactions to medications - damage to eyes, teeth, lips or other oral mucosa - nerve damage due to positioning  - sore throat or hoarseness - Damage to heart, brain, nerves, lungs, other parts of body or loss of life  Patient voiced understanding.)       Anesthesia Quick Evaluation

## 2022-09-28 NOTE — Transfer of Care (Signed)
Immediate Anesthesia Transfer of Care Note  Patient: Paul Ana [name]  Procedure(s) Performed: LAPAROSCOPIC RIGHT COLECTOMY, RNFA to assist (Right) HERNIA REPAIR VENTRAL ADULT  Patient Location: PACU  Anesthesia Type:General  Level of Consciousness: drowsy  Airway & Oxygen Therapy: Patient Spontanous Breathing and Patient connected to face mask oxygen  Post-op Assessment: Report given to RN and Post -op Vital signs reviewed and stable  Post vital signs: Reviewed  Last Vitals:  Vitals Value Taken Time  BP 189/87 09/28/22 1520  Temp 9F   Pulse 63 09/28/22 1526  Resp 10 09/28/22 1526  SpO2 100 % 09/28/22 1526  Vitals shown include unfiled device data.  Last Pain:  Vitals:   09/28/22 1056  TempSrc: Oral  PainSc: 0-No pain         Complications:  Encounter Notable Events  Notable Event Outcome Phase Comment  Difficult to intubate - expected  Intraprocedure Filed from anesthesia note documentation.

## 2022-09-28 NOTE — Anesthesia Procedure Notes (Signed)
Procedure Name: Intubation Date/Time: 09/28/2022 12:12 PM  Performed by: Monico Hoar, CRNAPre-anesthesia Checklist: Patient identified, Patient being monitored, Timeout performed, Emergency Drugs available and Suction available Patient Re-evaluated:Patient Re-evaluated prior to induction Oxygen Delivery Method: Circle system utilized Preoxygenation: Pre-oxygenation with 100% oxygen Induction Type: IV induction Ventilation: Mask ventilation without difficulty Laryngoscope Size: McGraph and 4 Grade View: Grade I Tube type: Oral Tube size: 7.5 mm Number of attempts: 1 Airway Equipment and Method: Stylet Placement Confirmation: ETT inserted through vocal cords under direct vision, positive ETCO2 and breath sounds checked- equal and bilateral Secured at: 23 cm Tube secured with: Tape Dental Injury: Teeth and Oropharynx as per pre-operative assessment  Difficulty Due To: Difficulty was anticipated, Difficult Airway- due to large tongue and Difficult Airway- due to dentition

## 2022-09-28 NOTE — Anesthesia Postprocedure Evaluation (Signed)
Anesthesia Post Note  Patient: Uzair Maner [name]  Procedure(s) Performed: LAPAROSCOPIC RIGHT COLECTOMY, RNFA to assist (Right) HERNIA REPAIR VENTRAL ADULT  Patient location during evaluation: PACU Anesthesia Type: General Level of consciousness: awake and alert Pain management: pain level controlled Vital Signs Assessment: post-procedure vital signs reviewed and stable Respiratory status: spontaneous breathing, nonlabored ventilation, respiratory function stable and patient connected to nasal cannula oxygen Cardiovascular status: blood pressure returned to baseline and stable Postop Assessment: no apparent nausea or vomiting Anesthetic complications: yes   Encounter Notable Events  Notable Event Outcome Phase Comment  Difficult to intubate - expected  Intraprocedure Filed from anesthesia note documentation.     Last Vitals:  Vitals:   09/28/22 1730 09/28/22 1745  BP: (!) 153/85 (!) 163/75  Pulse: 63 (!) 58  Resp: (!) 7 (!) 21  Temp: 36.7 C   SpO2: 98% 100%    Last Pain:  Vitals:   09/28/22 1745  TempSrc:   PainSc: 6                  Yevette Edwards

## 2022-09-28 NOTE — Interval H&P Note (Signed)
History and Physical Interval Note:  09/28/2022 11:42 AM  Paul Ana [name]  has presented today for surgery, with the diagnosis of colon mass ventral hernia 3 cm reducible.  The various methods of treatment have been discussed with the patient and family. After consideration of risks, benefits and other options for treatment, the patient has consented to  Procedure(s): LAPAROSCOPIC RIGHT COLECTOMY, RNFA to assist (Right) HERNIA REPAIR VENTRAL ADULT (N/A) as a surgical intervention.  The patient's history has been reviewed, patient examined, no change in status, stable for surgery.  I have reviewed the patient's chart and labs.  Questions were answered to the patient's satisfaction.     Paul Briggs F Zaiyah Sottile

## 2022-09-28 NOTE — Op Note (Addendum)
PROCEDURES: 1. Hand assisted Laparoscopic Right Hemicolectomy With stapled ileocolostomy 2. Repair 1.5 incarcerated UH  Pre-operative Diagnosis: Right Colon mass  Post-operative Diagnosis: Same  Surgeon: Leafy Ro   Assistants: Gaston Islam RNFA  Anesthesia: General endotracheal anesthesia  ASA Class: 2  Surgeon: Sterling Big , MD FACS  Anesthesia: Gen. with endotracheal tube   Findings: No evidence of distant metastasis. Tattoo mid ascending colon Large mesenteric nodes  Tension free anastomosis, no evidence of intraop leak and good perfusion 1.5 cm UH  Estimated Blood Loss: 20cc         Drains: none         Specimens: Right colon          Complications: none          Procedure Details  The patient was seen again in the Holding Room. The benefits, complications, treatment options, and expected outcomes were discussed with the patient. The risks of bleeding, infection, recurrence of symptoms, failure to resolve symptoms,  bowel injury, any of which could require further surgery were reviewed with the patient.   The patient was taken to Operating Room, identified and the procedure verified.  A Time Out was held and the above information confirmed.  Prior to the induction of general anesthesia, antibiotic prophylaxis was administered. VTE prophylaxis was in place. General endotracheal anesthesia was then administered and tolerated well. After the induction, the abdomen was prepped with Chloraprep and draped in the sterile fashion. The patient was positioned in the supine position.  7 cm incision was created as a midline mini laparotomy. We encountered a hernia sac and the hernia sac was excised, The abdominal cavity was entered via the defect under direct visualization and the GelPort device was placed. two 5 mm ports were placed  under direct visualization and pneumoperitoneum was obtained.  No hemo dynamic changes were observed. The greater omentum was divided and  the hepatic flexure was taken down using harmonic scalpel.  The white line of Toldt was incised and a lateral to medial dissection was performed.  We identified the right ureter as well as the duodenum and preserve both structures at all times. Were also able to mobilize the attachments of the cecum and terminal ileum.  Once we had an adequate mobilization were able to remove the GelPort and exteriorized the right colon.  A 10 cm margin on the terminal ileum was identified and we created a window with electrocautery and divided the terminal ileum.  Attention then was turned to the distal excision margin.  We identified the Ileocolic pedicle and ligated using the 60 mm Echelon vascular stapler, the  middle colic artery was also divided in a similar fashion.  We Were able to also use a 75 GIA stapler to divide Transverse colon.  The mesentery was scored with electrocautery.   The rest of the mesentery was divided using the harmonic scalpel.  Please note that we went as low as possible to the base of the mesentery to obtain adequate lymph nodes and adequate margins of dissection. Specimen was passed and sent to permanent pathology.  A standard side-to-side functional end to end staple anastomosis was created with multiple loads of a 75 GIA stapler device. Common channel closed w interrupted 2-0 vicryls. We check for patency as well as leak.  There was a tension-free anastomosis with good perfusion and no evidence of intraoperative leak.  We changed gloves and place a clean closure tray.   Liposomal Marcaine was injected throughout the  abdominal wall on both sides under direct visualization and palpation.  The fascia was closed with a running 0 PDS using the small bite techniques. I incorporated the prior umbilical defect with the 0 PDS suture. Incisions were closed with 4-0 Monocryl in a subcuticular fashion.  Dermabond was used to coat the skin.  Needle and laparotomy counts were correct and there were no immediate  complications     Sterling Big, MD, FACS

## 2022-09-29 ENCOUNTER — Ambulatory Visit: Payer: No Typology Code available for payment source

## 2022-09-29 ENCOUNTER — Encounter: Payer: Self-pay | Admitting: Surgery

## 2022-09-29 LAB — BASIC METABOLIC PANEL WITH GFR
Anion gap: 7 (ref 5–15)
BUN: 21 mg/dL (ref 8–23)
CO2: 21 mmol/L — ABNORMAL LOW (ref 22–32)
Calcium: 7.7 mg/dL — ABNORMAL LOW (ref 8.9–10.3)
Chloride: 106 mmol/L (ref 98–111)
Creatinine, Ser: 1.42 mg/dL — ABNORMAL HIGH (ref 0.61–1.24)
GFR, Estimated: 51 mL/min — ABNORMAL LOW
Glucose, Bld: 129 mg/dL — ABNORMAL HIGH (ref 70–99)
Potassium: 4.5 mmol/L (ref 3.5–5.1)
Sodium: 134 mmol/L — ABNORMAL LOW (ref 135–145)

## 2022-09-29 LAB — CBC
HCT: 31.1 % — ABNORMAL LOW (ref 39.0–52.0)
Hemoglobin: 9.7 g/dL — ABNORMAL LOW (ref 13.0–17.0)
MCH: 29.1 pg (ref 26.0–34.0)
MCHC: 31.2 g/dL (ref 30.0–36.0)
MCV: 93.4 fL (ref 80.0–100.0)
Platelets: 144 K/uL — ABNORMAL LOW (ref 150–400)
RBC: 3.33 MIL/uL — ABNORMAL LOW (ref 4.22–5.81)
RDW: 21.9 % — ABNORMAL HIGH (ref 11.5–15.5)
WBC: 10.4 K/uL (ref 4.0–10.5)
nRBC: 0 % (ref 0.0–0.2)

## 2022-09-29 LAB — GLUCOSE, CAPILLARY
Glucose-Capillary: 127 mg/dL — ABNORMAL HIGH (ref 70–99)
Glucose-Capillary: 112 mg/dL — ABNORMAL HIGH (ref 70–99)
Glucose-Capillary: 104 mg/dL — ABNORMAL HIGH (ref 70–99)
Glucose-Capillary: 140 mg/dL — ABNORMAL HIGH (ref 70–99)

## 2022-09-29 MED ORDER — METFORMIN HCL 500 MG PO TABS
ORAL_TABLET | ORAL | Status: AC
  Filled 2022-09-29: qty 1

## 2022-09-29 MED ORDER — HYDRALAZINE HCL 50 MG PO TABS
ORAL_TABLET | ORAL | Status: AC
  Filled 2022-09-29: qty 1

## 2022-09-29 MED ORDER — ENSURE ENLIVE PO LIQD: 237.0000 mL | Freq: Three times a day (TID) | ORAL | Status: DC

## 2022-09-29 MED ORDER — OXYCODONE HCL 5 MG PO TABS
ORAL_TABLET | ORAL | Status: AC
  Filled 2022-09-29: qty 1

## 2022-09-29 MED ORDER — ACETAMINOPHEN 500 MG PO TABS
ORAL_TABLET | ORAL | Status: AC
  Filled 2022-09-29: qty 2

## 2022-09-29 MED ORDER — PANTOPRAZOLE SODIUM 40 MG IV SOLR
INTRAVENOUS | Status: AC
  Filled 2022-09-29: qty 10

## 2022-09-29 MED ORDER — KETOROLAC TROMETHAMINE 15 MG/ML IJ SOLN
INTRAMUSCULAR | Status: AC
  Filled 2022-09-29: qty 1

## 2022-09-29 MED ORDER — MORPHINE SULFATE (PF) 4 MG/ML IV SOLN
INTRAVENOUS | Status: AC
  Filled 2022-09-29: qty 1

## 2022-09-29 MED ORDER — OXYCODONE HCL 5 MG PO TABS
ORAL_TABLET | ORAL | Status: AC
  Filled 2022-09-29: qty 2

## 2022-09-29 MED ORDER — SACUBITRIL-VALSARTAN 49-51 MG PO TABS
1.0000 | ORAL_TABLET | Freq: Two times a day (BID) | ORAL | Status: DC
  Administered 2022-09-29 – 2022-10-06 (×14): 1 via ORAL
  Filled 2022-09-29 (×14): qty 1

## 2022-09-29 MED ORDER — SIMETHICONE 80 MG PO CHEW
80.0000 mg | CHEWABLE_TABLET | Freq: Four times a day (QID) | ORAL | Status: DC | PRN
  Administered 2022-09-29 – 2022-10-02 (×3): 80 mg via ORAL
  Filled 2022-09-29 (×3): qty 1

## 2022-09-29 MED ORDER — ENOXAPARIN SODIUM 40 MG/0.4ML IJ SOSY
PREFILLED_SYRINGE | INTRAMUSCULAR | Status: AC
  Filled 2022-09-29: qty 0.4

## 2022-09-29 MED ORDER — CEFAZOLIN SODIUM-DEXTROSE 2-4 GM/100ML-% IV SOLN
INTRAVENOUS | Status: AC
  Filled 2022-09-29: qty 100

## 2022-09-29 MED ORDER — SODIUM CHLORIDE 0.9 % IV SOLN
INTRAVENOUS | Status: AC
  Filled 2022-09-29: qty 2

## 2022-09-29 MED ORDER — ADULT MULTIVITAMIN W/MINERALS CH
ORAL_TABLET | ORAL | Status: AC
  Filled 2022-09-29: qty 1

## 2022-09-29 MED ORDER — HYDRALAZINE HCL 20 MG/ML IJ SOLN
INTRAMUSCULAR | Status: AC
  Filled 2022-09-29: qty 1

## 2022-09-29 MED ORDER — ADULT MULTIVITAMIN W/MINERALS CH
1.0000 | ORAL_TABLET | Freq: Every day | ORAL | Status: DC
  Administered 2022-09-29 – 2022-10-06 (×8): 1 via ORAL
  Filled 2022-09-29 (×6): qty 1

## 2022-09-29 NOTE — Progress Notes (Signed)
Initial Nutrition Assessment  DOCUMENTATION CODES:   Not applicable  INTERVENTION:   -Ensure Enlive po TID, each supplement provides 350 kcal and 20 grams of protein -MVI with minerals daily -Magic cup TID with meals, each supplement provides 290 kcal and 9 grams of protein   NUTRITION DIAGNOSIS:   Increased nutrient needs related to post-op healing as evidenced by estimated needs.  GOAL:   Patient will meet greater than or equal to 90% of their needs  MONITOR:   PO intake, Supplement acceptance, Diet advancement  REASON FOR ASSESSMENT:   Malnutrition Screening Tool    ASSESSMENT:   Pt with PMH of CLL, DM, CHF, HTN, and CKD admitted with an unresectable hepatic flexure near obstructing mass.  9/3- s/p Hand assisted Laparoscopic Right Hemicolectomy with stapled ileocolostomy; Repair 1.5 incarcerated [facility]  Reviewed I/O's: +1.1 L x 24 hours  UOP: 450 ml x 24 hours   Pt unavailable at time of visit. RD unable to obtain further nutrition-related history or complete nutrition-focused physical exam at this time.    Pt currently on a full liquid diet. No meal completion data available to assess at this time.   Reviewed wt hx; pt has experienced a 8% wt loss over the past 2 months, which is significant for time frame.  Pt with high risk for malnutrition given wt loss and multiple co-morbidities but unable to identify at this time. Pt would greatly benefit from addition of oral nutrition supplements.   Per general surgery notes, plan to start PT tomorrow.   Medications reviewed.    Lab Results  Component Value Date   HGBA1C 6.4 (H) 08/21/2022   PTA DM medications are 500 mg metformin BID.   Labs reviewed: Na: 134, CBGS: 92-146 (inpatient orders for glycemic control are 500 mg metformin BID, 0-15 units insulin aspart TID with meals, 0-5 units insulin aspart daily at bedtime, and 4 units insulin aspart daily at bedtime).    Diet Order:   Diet Order             Diet  full liquid Room service appropriate? Yes; Fluid consistency: Thin  Diet effective 1400                   EDUCATION NEEDS:   No education needs have been identified at this time  Skin:  Skin Assessment: Skin Integrity Issues: Skin Integrity Issues:: Incisions Incisions: closed abdomen  Last BM:  Unknown  Height:   Ht Readings from Last 1 Encounters:  09/28/22 6\' 1"  (1.854 m)    Weight:   Wt Readings from Last 1 Encounters:  09/28/22 98.9 kg    Ideal Body Weight:  83.6 kg  BMI:  Body mass index is 28.77 kg/m.  Estimated Nutritional Needs:   Kcal:  2100-2300  Protein:  115-130 grams  Fluid:  > 2 L    Levada Schilling, RD, LDN, CDCES Registered Dietitian II Certified Diabetes Care and Education Specialist Please refer to [facility] for RD and/or RD on-call/weekend/after hours pager

## 2022-09-29 NOTE — Plan of Care (Signed)
  Problem: Respiratory: Goal: Ability to maintain adequate ventilation will improve Outcome: Progressing   Problem: Coping: Goal: Ability to adjust to condition or change in health will improve Outcome: Progressing

## 2022-09-29 NOTE — Progress Notes (Signed)
[facility] SURGICAL PROGRESS NOTE  Hospital Day(s): 1.   Post op day(s): 1 Day Post-Op.   Interval History:  Patient seen and examined No acute events or new complaints overnight.  Patient reports he is doing okay; very sore - worse with trying to move No fever, chills, nausea, emesis  WBC normal at 10.4K Hgb 9.7 (from 10.5) AKI; sCr - 1.47; UO - 450 ccs He is on CLD; tolerating No flatus   Vital signs in last 24 hours: [min-max] current  Temp:  [97 F (36.1 C)-99 F (37.2 C)] 99 F (37.2 C) (09/04 0733) Pulse Rate:  [50-67] 60 (09/04 0733) Resp:  [7-21] 16 (09/04 0733) BP: (146-194)/(73-95) 159/73 (09/04 0733) SpO2:  [86 %-100 %] 95 % (09/04 0733) Weight:  [98.9 kg] 98.9 kg (09/03 1056)     Height: 6\' 1"  (185.4 cm) Weight: 98.9 kg BMI (Calculated): 28.77   Intake/Output last 2 shifts:  09/03 0701 - 09/04 0700 In: 1605 [I.V.:1405; IV Piggyback:200] Out: 500 [Urine:450; Blood:50]   Physical Exam:  Constitutional: alert, cooperative and no distress  Respiratory: breathing non-labored at rest  Cardiovascular: regular rate and sinus rhythm  Gastrointestinal: soft, expected incisional soreness, and non-distended, no rebound/guarding Genitourinary: Foley in place; good UO Integumentary: Laparoscopic incisions are CDI with dermabond, no erythema   Labs:     Latest Ref Rng & Units 09/29/2022    6:30 AM 09/28/2022    8:37 PM 09/21/2022    1:14 PM  CBC  WBC 4.0 - 10.5 K/uL 10.4  11.2  6.6   Hemoglobin 13.0 - 17.0 g/dL 9.7  15.1  76.1   Hematocrit 39.0 - 52.0 % 31.1  33.8  33.4   Platelets 150 - 400 K/uL 144  138  151       Latest Ref Rng & Units 09/29/2022    6:30 AM 09/28/2022    7:59 PM 09/03/2022   11:48 AM  CMP  Glucose 70 - 99 mg/dL 607   371   BUN 8 - 23 mg/dL 21   13   Creatinine 0.62 - 1.24 mg/dL 6.94  8.54  6.27   Sodium 135 - 145 mmol/L 134   139   Potassium 3.5 - 5.1 mmol/L 4.5   3.9   Chloride 98 - 111 mmol/L 106   109   CO2 22 - 32 mmol/L 21    23   Calcium 8.9 - 10.3 mg/dL 7.7   8.2   Total Protein 6.5 - 8.1 g/dL   6.1   Total Bilirubin 0.3 - 1.2 mg/dL   0.9   Alkaline Phos 38 - 126 U/L   73   AST 15 - 41 U/L   45   ALT 0 - 44 U/L   24      Imaging studies: No new pertinent imaging studies   Assessment/Plan:  77 y.o. male with AKI otherwise doing well 1 Day Post-Op s/p robotic assisted laparoscopic right hemicolectomy and ventral hernia repair for right colon mass   - Continue CLD for now  - Keep IVF giving AKI; monitor UO  - Keep foley today for  - Complete perioperative ABx   - Monitor abdominal examination; on-going bowel function - Pain control prn; antiemetics prn - Morning labs - Mobilize; will engage PT; okay to start tomorrow (09/05)   All of the above findings and recommendations were discussed with the patient, and the medical team, and all of patient's questions were answered to his expressed satisfaction.  --  Lynden Oxford, PA-C [facility] 09/29/2022, 8:12 AM M-F: 7am - 4pm

## 2022-09-29 NOTE — Progress Notes (Signed)
Spoke via phone with dr pabon r/t BP spiking to 215/96.  Was check in both arms without significant difference.  Pt had sudden onset severe mid abdominal pain "like someone punched me in the stomach".  Called and requested pharmacist to verify all meds as pt is not sure of home meds (pharmacy closed, they will do in AM).  Pt has received all pain medications able. Per dr pabon start with IV antihypertensive to bring pressure down.  Pt has received his oral BP meds.  Pt in chair at this time.  Will recheck pressure and give PIV meds if remains high.

## 2022-09-30 ENCOUNTER — Encounter: Payer: Self-pay | Admitting: Surgery

## 2022-09-30 ENCOUNTER — Inpatient Hospital Stay: Payer: No Typology Code available for payment source

## 2022-09-30 DIAGNOSIS — K6389 Other specified diseases of intestine: Secondary | ICD-10-CM

## 2022-09-30 DIAGNOSIS — I517 Cardiomegaly: Secondary | ICD-10-CM | POA: Diagnosis not present

## 2022-09-30 LAB — BASIC METABOLIC PANEL
Anion gap: 7 (ref 5–15)
BUN: 15 mg/dL (ref 8–23)
CO2: 24 mmol/L (ref 22–32)
Calcium: 7.7 mg/dL — ABNORMAL LOW (ref 8.9–10.3)
Chloride: 103 mmol/L (ref 98–111)
Creatinine, Ser: 1.14 mg/dL (ref 0.61–1.24)
GFR, Estimated: >60 mL/min (ref >60)
Glucose, Bld: 106 mg/dL — ABNORMAL HIGH (ref 70–99)
Potassium: 3.8 mmol/L (ref 3.5–5.1)
Sodium: 134 mmol/L — ABNORMAL LOW (ref 135–145)

## 2022-09-30 LAB — GLUCOSE, CAPILLARY
Glucose-Capillary: 133 mg/dL — ABNORMAL HIGH (ref 70–99)
Glucose-Capillary: 111 mg/dL — ABNORMAL HIGH (ref 70–99)
Glucose-Capillary: 111 mg/dL — ABNORMAL HIGH (ref 70–99)
Glucose-Capillary: 124 mg/dL — ABNORMAL HIGH (ref 70–99)

## 2022-09-30 LAB — CBC
HCT: 29.4 % — ABNORMAL LOW (ref 39.0–52.0)
Hemoglobin: 9.4 g/dL — ABNORMAL LOW (ref 13.0–17.0)
MCH: 29.7 pg (ref 26.0–34.0)
MCHC: 32 g/dL (ref 30.0–36.0)
MCV: 92.7 fL (ref 80.0–100.0)
Platelets: 130 10*3/uL — ABNORMAL LOW (ref 150–400)
RBC: 3.17 MIL/uL — ABNORMAL LOW (ref 4.22–5.81)
RDW: 22.1 % — ABNORMAL HIGH (ref 11.5–15.5)
WBC: 9 10*3/uL (ref 4.0–10.5)
nRBC: 0 % (ref 0.0–0.2)

## 2022-09-30 LAB — PREPARE RBC (CROSSMATCH)

## 2022-09-30 LAB — SARS CORONAVIRUS 2 BY RT PCR: SARS Coronavirus 2 by RT PCR: NEGATIVE

## 2022-09-30 MED ORDER — HYDRALAZINE HCL 20 MG/ML IJ SOLN
10.0000 mg | Freq: Four times a day (QID) | INTRAMUSCULAR | Status: AC | PRN
  Filled 2022-09-30: qty 1

## 2022-09-30 MED ORDER — HYDRALAZINE HCL 20 MG/ML IJ SOLN
INTRAMUSCULAR | Status: AC
  Filled 2022-09-30: qty 1

## 2022-09-30 MED ORDER — METFORMIN HCL 500 MG PO TABS
ORAL_TABLET | ORAL | Status: AC
  Filled 2022-09-30: qty 1

## 2022-09-30 MED ORDER — ADULT MULTIVITAMIN W/MINERALS CH
ORAL_TABLET | ORAL | Status: AC
  Filled 2022-09-30: qty 1

## 2022-09-30 MED ORDER — ACETAMINOPHEN 500 MG PO TABS
ORAL_TABLET | ORAL | Status: AC
  Filled 2022-09-30: qty 2

## 2022-09-30 MED ORDER — HYDRALAZINE HCL 50 MG PO TABS
ORAL_TABLET | ORAL | Status: AC
  Filled 2022-09-30: qty 1

## 2022-09-30 MED ORDER — ENOXAPARIN SODIUM 40 MG/0.4ML IJ SOSY
PREFILLED_SYRINGE | INTRAMUSCULAR | Status: AC
  Filled 2022-09-30: qty 0.4

## 2022-09-30 MED ORDER — OXYCODONE HCL 5 MG PO TABS
ORAL_TABLET | ORAL | Status: AC
  Filled 2022-09-30: qty 2

## 2022-09-30 MED ORDER — MORPHINE SULFATE (PF) 2 MG/ML IV SOLN
INTRAVENOUS | Status: AC
  Filled 2022-09-30: qty 1

## 2022-09-30 MED ORDER — MORPHINE SULFATE (PF) 4 MG/ML IV SOLN
INTRAVENOUS | Status: AC
  Filled 2022-09-30: qty 1

## 2022-09-30 MED ORDER — HYDRALAZINE HCL 20 MG/ML IJ SOLN: 10.0000 mg | INTRAMUSCULAR | Status: DC | PRN

## 2022-09-30 NOTE — Progress Notes (Signed)
Pt transferred to room 230. Bedside report given to [place], [place].   Pt states he will notify family of his transfer.

## 2022-09-30 NOTE — Assessment & Plan Note (Signed)
PDMP reviewed.

## 2022-09-30 NOTE — Hospital Course (Addendum)
[name], [name] is a 77 year old male with history of hypertension, hypothyroid, non-insulin-dependent diabetes mellitus, hyperlipidemia, who was admitted under general surgery service for hand-assisted laparoscopic right hemicolectomy with stapled ileocolostomy on 09/28/2022.  Hospitalist service was consulted for persistent hypertension.  Latest vitals at the time of consultation showed temperature 98.7, respiration rate of 16, heart rate of 62, blood pressure 157/83, SpO2 99% on room air.  Serum sodium is 134, potassium 3.8, chloride 103, bicarb 24, BUN of 15, serum creatinine of 1.14, EGFR greater than 60, nonfasting blood glucose 106, WBC 9.0, hemoglobin 9.4, platelets of 130.  On MAR review, patient's home antihypertensive medications other following:  hydralazine 50 mg p.o. twice daily, resumed on 9/3 in the evening at 2159.  Metoprolol succinate 25 mg p.o. daily was resumed on 9/4, first dose per MAR given at 1031.  Entresto 49-51 mg p.o. twice daily, resumed on 9/4 with first dose at 2115.

## 2022-09-30 NOTE — Assessment & Plan Note (Addendum)
Uncontrolled, currently patient's blood pressure is within patient's normal range Per and at bedside, his baseline SBP is 160s to 170s I suspect that the initial readings of blood pressure of 215/96 was due to some of patient's and hypertensive medications being held for surgery and postop Hydralazine 10 mg IV every 6 hours prn for SBP greater 175

## 2022-09-30 NOTE — Plan of Care (Signed)
  Problem: Clinical Measurements: Goal: Signs and symptoms of infection will decrease Outcome: Progressing   Problem: Metabolic: Goal: Ability to maintain appropriate glucose levels will improve Outcome: Progressing

## 2022-09-30 NOTE — Consult Note (Signed)
Initial Consultation Note  Paul Worthman [name] ZOX:[id number] DOB: [birth year] DOA: 09/28/2022  PCP: Wilford Corner, PA-C  Outpatient Specialists: Dr. Everlene Farrier, general surgery  I have personally briefly reviewed Paul Briggs's old medical records in [facility] EMR.  Consult request service: General Surgery Consulting reason: Hypertension, uncontrolled  HPI: Paul Briggs, [name] is a 77 year old male with history of hypertension, hypothyroid, non-insulin-dependent diabetes mellitus, hyperlipidemia, who was admitted under general surgery service for hand-assisted laparoscopic right hemicolectomy with stapled ileocolostomy on 09/28/2022.  Hospitalist service was consulted for persistent hypertension.  Latest vitals at the time of consultation showed temperature 98.7, respiration rate of 16, heart rate of 62, blood pressure 157/83, SpO2 99% on room air.  Serum sodium is 134, potassium 3.8, chloride 103, bicarb 24, BUN of 15, serum creatinine of 1.14, EGFR greater than 60, nonfasting blood glucose 106, WBC 9.0, hemoglobin 9.4, platelets of 130.  On MAR review, Paul Briggs's home antihypertensive medications other following:  hydralazine 50 mg p.o. twice daily, resumed on 9/3 in the evening at 2159.  Metoprolol succinate 25 mg p.o. daily was resumed on 9/4, first dose per MAR given at 1031.  Entresto 49-51 mg p.o. twice daily, resumed on 9/4 with first dose at 2115. ------------------------- At bedside, Paul Briggs is abl to tell me his name, age, location, current calendar year.  He reprots poor appetite. He reprots no full bowel movement since Tuesday. He reports he is passing flatulence.   He reports that his baseline blood pressure ranges 160s to 170s.  He reports this is normal for him.  He reports the medications are controlling his pain.  Social history: He lives with his wife. He denies tobacco, etoh, recreational drug use. 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   ROS: Constitutional: no weight change, no fever ENT/Mouth: no sore throat, no rhinorrhea Eyes: no eye pain, no vision changes Cardiovascular: no chest pain, no dyspnea,  no edema, no palpitations Respiratory: no cough, no sputum, no wheezing Gastrointestinal: no nausea, no vomiting, no diarrhea, no constipation Genitourinary: no urinary incontinence, no dysuria, no hematuria Musculoskeletal: no arthralgias, no myalgias Skin: no skin lesions, no pruritus, Neuro: + weakness, no loss of consciousness, no syncope Psych: no anxiety, no depression, + decrease appetite Heme/Lymph: no bruising, no bleeding  Assessment/Plan  Principal Problem:   Mass of colon Active Problems:   Essential hypertension   Hypothyroidism   CKD (chronic kidney disease) stage 3, GFR 30-59 ml/min (HCC)   Chronic pain syndrome   Class 1 obesity without serious comorbidity with body mass index (BMI) of 30.0 to 30.9 in adult   CLL (chronic lymphocytic leukemia) (HCC)   IDA (iron deficiency anemia)   Chronic, continuous use of opioids   Assessment and Plan:  Essential hypertension Uncontrolled, currently Paul Briggs's blood pressure is within Paul Briggs's normal range Per and at bedside, his baseline SBP is 160s to 170s I suspect that the initial readings of blood pressure of 215/96 was due to some of Paul Briggs's and hypertensive medications being held for surgery and postop Hydralazine 10 mg IV every 6 hours prn for SBP greater 175  * Mass of colon Paul Briggs is status post hand assisted laparoscopic right colectomy on 09/28/2022 with stapled ileocolostomy Per primary team  Chronic, continuous use of opioids PDMP reviewed  Chronic pain syndrome PDMP reviewed  CKD (chronic kidney disease) stage 3, GFR 30-59 ml/min (HCC) No CKD shown on labs today Continue to monitor  Decreased appetite in setting of recent colectomy-discussed with  Paul Briggs extensively at bedside that appropriate nutrition will help him  heal and recover Paul Briggs reports he just has no appetite Registered dietitian has been consulted  Thank you for involving [facility] and the care of Mr. Ruszczyk.  [facility] hospital[facility] will continue to follow.  Chart reviewed.   DVT prophylaxis:  Code Status: Full code Diet: Per primary team Disposition Plan: Per primary team  Past Medical History:  Diagnosis Date   Adenoma of left adrenal gland    Aortic atherosclerosis (HCC)    BPH (benign prostatic hyperplasia)    Bradycardia    CAD (coronary artery disease) 08/26/2022   a.) cCTA 08/26/2022: Ca2+ 14.3 (12th %'ile for age/sex.race match control); (<25%) pLAD   Cellulitis of left thigh 08/2022   CHF (congestive heart failure) (HCC)    a.) TTE 10/23/2015: EF 60-65%, mod-sev LVH, mild biatrial dil, degen MV disease, AoV sclerosis, asc Ao 38 mm, G1DD; b.) TTE 08/05/2022: EF 45-50%, mod-sev LVH (speckled pattern), sev biatrial dil, mild MR, AoV sclerosis, G1DD   Chronic pain syndrome    a.) on COT managed by pain management   Chronic, continuous use of opioids    a.) chronic pain syndrome/chronic back pain; managed by pain management   CKD (chronic kidney disease) stage 3, GFR 30-59 ml/min (HCC)    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   a.) Rai stage III   DDD (degenerative disc disease), cervical    Difficult airway 10/22/2015   a.) 1st attempt with glidescope  --> macroglossic (unsuccessful); b.) 2nd attempt with Mac 4 and direct laryngoscopy with a 8.0 ETT --> tube too large to pass through the cords; b.) 3rd attempt (successful) with a Mac 4 and a 7.5 tube   Diverticulosis    DM (diabetes mellitus), type 2 (HCC)    Dysplasia of prostate    Erectile dysfunction    Frequent falls    Hepatic flexure mass 08/23/2022   a.) colonoscopy 08/23/2022: 14 x 15 mm partially obstructing ulcerated distal ascending colon mass; tissue friable --> pathology resulted as tubular adenoma, however felt to be false (-) -->  referral to sugery and colectomy recommended.   History of MRSA infection 08/28/2022   a.) MRSA PCR (+) 08/28/2022; culture from LEFT thigh abscess   HTN (hypertension)    Hyperlipidemia    Hypogonadism in male    Hypothyroidism    IDA (iron deficiency anemia)    LBBB (left bundle branch block)    Long-term use of aspirin therapy    Lumbar spinal stenosis    Mild cardiomegaly    Nodule of right lung    Orchitis and epididymitis 06/16/2013   OSA on CPAP    Palindromic rheumatism, hand    Pericardial effusion    Prostatitis    Sepsis (HCC) 08/2022   Subdural hematoma (HCC) 10/22/2015   a.) s/p traumatic mechanical fall --> CT head 10/22/2015: high-density SDH the left cerebral convexity with13 mm midline shift --> s/p LEFT frontal burr hole craniotomy   Tubular adenoma of colon    Ventral hernia    Past Surgical History:  Procedure Laterality Date   BACK SURGERY     BIOPSY  08/21/2022   Procedure: BIOPSY;  Surgeon: Jaynie Collins, DO;  Location: [facility] ENDOSCOPY;  Service: Gastroenterology;;   BIOPSY  08/23/2022   Procedure: BIOPSY;  Surgeon: Jaynie Collins, DO;  Location: [facility] ENDOSCOPY;  Service: Gastroenterology;;   Guss Bunde OF CRANIUM Left 10/22/2015   COLONOSCOPY WITH PROPOFOL N/A 08/23/2022  Procedure: COLONOSCOPY WITH PROPOFOL;  Surgeon: Jaynie Collins, DO;  Location: [facility] ENDOSCOPY;  Service: Gastroenterology;  Laterality: N/A;   ESOPHAGOGASTRODUODENOSCOPY (EGD) WITH PROPOFOL N/A 08/21/2022   Procedure: ESOPHAGOGASTRODUODENOSCOPY (EGD) WITH PROPOFOL;  Surgeon: Jaynie Collins, DO;  Location: [facility] ENDOSCOPY;  Service: Gastroenterology;  Laterality: N/A;   GIVENS CAPSULE STUDY N/A 08/23/2022   Procedure: GIVENS CAPSULE STUDY;  Surgeon: Jaynie Collins, DO;  Location: [facility] ENDOSCOPY;  Service: Gastroenterology;  Laterality: N/A;   IRRIGATION AND DEBRIDEMENT ABSCESS Left 08/28/2022   Procedure: IRRIGATION AND DEBRIDEMENT ABSCESS LEFT UPPER  THIGH/GROIN;  Surgeon: Leafy Ro, MD;  Location: [facility] ORS;  Service: General;  Laterality: Left;   LAPAROSCOPIC RIGHT COLECTOMY Right 09/28/2022   Procedure: LAPAROSCOPIC RIGHT COLECTOMY, RNFA to assist;  Surgeon: Leafy Ro, MD;  Location: [facility] ORS;  Service: General;  Laterality: Right;   POLYPECTOMY  08/23/2022   Procedure: POLYPECTOMY;  Surgeon: Jaynie Collins, DO;  Location: [facility] ENDOSCOPY;  Service: Gastroenterology;;   SUBMUCOSAL TATTOO INJECTION  08/23/2022   Procedure: SUBMUCOSAL TATTOO INJECTION;  Surgeon: Jaynie Collins, DO;  Location: [facility] ENDOSCOPY;  Service: Gastroenterology;;   TONSILLECTOMY     VENTRAL HERNIA REPAIR N/A 09/28/2022   Procedure: HERNIA REPAIR VENTRAL ADULT;  Surgeon: Leafy Ro, MD;  Location: [facility] ORS;  Service: General;  Laterality: N/A;   Social History:  reports that he has quit smoking. His smoking use included cigarettes. He has been exposed to tobacco smoke. He has never used smokeless tobacco. He reports that he does not drink alcohol and does not use drugs.  No Known Allergies Family History  Problem Relation Age of Onset   Cancer Maternal Aunt    Prostate cancer Neg Hx    Kidney disease Neg Hx    Kidney cancer Neg Hx    Bladder Cancer Neg Hx    Family history: Family history reviewed and not pertinent  Prior to Admission medications   Medication Sig Start Date End Date Taking? Authorizing Provider  aspirin EC 81 MG tablet Take 1 tablet (81 mg total) by mouth daily. Swallow whole. 08/09/22  Yes Furth, Cadence H, PA-C  bisacodyl (DULCOLAX) 5 MG EC tablet Take all 4 tabs at 8 am the morning prior to surgery. 09/09/22  Yes Pabon, Diego F, MD  dapagliflozin propanediol (FARXIGA) 10 MG TABS tablet Take 1 tablet (10 mg total) by mouth daily before breakfast. 08/09/22  Yes Furth, Cadence H, PA-C  hydrALAZINE (APRESOLINE) 50 MG tablet Take 50 mg by mouth 2 (two) times daily.   Yes [provider]  levothyroxine (SYNTHROID) 200  MCG tablet Take 200 mcg by mouth daily before breakfast. 01/22/22  Yes [provider]  metFORMIN (GLUCOPHAGE) 500 MG tablet Take 500 mg by mouth 2 (two) times daily with a meal.   Yes [provider]  metoprolol succinate (TOPROL XL) 25 MG 24 hr tablet Take 1 tablet (25 mg total) by mouth daily. Paul Briggs taking differently: Take 25 mg by mouth every morning. 08/09/22  Yes Furth, Cadence H, PA-C  metroNIDAZOLE (FLAGYL) 500 MG tablet Take 2 tabs (1000 mg total) at 8 am, again at 2 pm, and again at 8 pm the day prior to surgery. 09/09/22  Yes Pabon, Diego F, MD  neomycin (MYCIFRADIN) 500 MG tablet Take 2 tabs (1000 mg total) at 8 am, again at 2 pm, and again at 8 pm the day prior to surgery. 09/09/22  Yes Pabon, Diego F, MD  oxyCODONE-acetaminophen (PERCOCET) 10-325 MG tablet  Take 1 tablet by mouth every 8 (eight) hours as needed. 09/22/22 10/22/22 Yes Edward Jolly, MD  oxyCODONE-acetaminophen (PERCOCET) 10-325 MG tablet Take 1 tablet by mouth every 8 (eight) hours as needed. 10/22/22 11/21/22 Yes Edward Jolly, MD  polyethylene glycol powder (MIRALAX) 17 GM/SCOOP powder Mix with 64 ounces of Gatorade (no red) the day prior to surgery and drink as directed. 09/09/22  Yes Pabon, Diego F, MD  rosuvastatin (CRESTOR) 40 MG tablet Take 1 tablet (40 mg total) by mouth daily. Paul Briggs taking differently: Take 40 mg by mouth every morning. 08/09/22 11/07/22 Yes Furth, Cadence H, PA-C  sacubitril-valsartan (ENTRESTO) 24-26 MG Take 1 tablet by mouth 2 (two) times daily. 09/17/22  Yes Furth, Cadence H, PA-C  tamsulosin (FLOMAX) 0.4 MG CAPS capsule Take 0.4 mg by mouth daily.    [provider]   Physical Exam: Vitals:   09/30/22 0737 09/30/22 1105 09/30/22 1404 09/30/22 1903  BP: (!) 169/96 (!) 157/83 (!) 173/76 (!) 176/89  Pulse: 73 62 (!) 58 63  Resp: 19 16 16 16   Temp: 99.2 F (37.3 C) 98.7 F (37.1 C) 98.1 F (36.7 C) 98.4 F (36.9 C)  TempSrc:  Oral Oral Oral  SpO2: 97% 99% 100%  99%  Weight:      Height:       Constitutional: appears age-appropriate, frail, NAD , calm Eyes: PERRL, lids and conjunctivae normal ENMT: Mucous membranes are moist. Posterior pharynx clear of any exudate or lesions. Age-appropriate dentition. Hearing appropriate Neck: normal, supple, no masses, no thyromegaly Respiratory: clear to auscultation bilaterally, no wheezing, no crackles. Normal respiratory effort. No accessory muscle use.  Cardiovascular: Regular rate and rhythm, no murmurs / rubs / gallops. No extremity edema. 2+ pedal pulses. No carotid bruits.  Abdomen: no tenderness, no masses palpated, no hepatosplenomegaly. Bowel sounds positive.  Musculoskeletal: no clubbing / cyanosis. No joint deformity upper and lower extremities. Good ROM, no contractures, no atrophy. Normal muscle tone.  Skin: no rashes, lesions, ulcers. No induration.  Multiple abdominal incision consistent with recent laparoscopic intra-abdominal procedure.  Wounds appear clean and as appropriate. Neurologic: Sensation intact. Strength 5/5 in all 4.  Psychiatric: Normal judgment and insight. Alert and oriented x 3.  Depressed mood.   EKG: Not indicated at this time  Chest x-ray on Admission: I personally reviewed and I agree with radiologist reading as below.  DG ABD ACUTE 2+V W 1V CHEST  Result Date: 09/30/2022 CLINICAL DATA:  Ileus, abdominal soreness. Recent laparoscopic surgery. EXAM: DG ABDOMEN ACUTE WITH 1 VIEW CHEST COMPARISON:  CT abdomen/pelvis 08/26/2022 FINDINGS: Chest: The heart is enlarged. The upper mediastinal contours are normal. There is no focal consolidation or pulmonary edema. There is no pleural effusion or pneumothorax. There is no acute osseous abnormality. Abdomen: There is free intraperitoneal air under the diaphragm. There is diffuse gaseous distention of the small and large bowel. There is no acute osseous abnormality. IMPRESSION: 1. Free intraperitoneal air consistent with the history of  recent laparoscopic surgery. 2. Gaseous distention of the small and large bowel may reflect ileus. 3. Cardiomegaly.  No focal consolidation or pleural effusion. Electronically Signed   By: Lesia Hausen M.D.   On: 09/30/2022 13:15    Labs on Admission: I have personally reviewed following labs CBC: Recent Labs  Lab 09/28/22 2037 09/29/22 0630 09/30/22 0518  WBC 11.2* 10.4 9.0  HGB 10.5* 9.7* 9.4*  HCT 33.8* 31.1* 29.4*  MCV 94.7 93.4 92.7  PLT 138* 144* 130*   Basic Metabolic  Panel: Recent Labs  Lab 09/28/22 1959 09/29/22 0630 09/30/22 0518  NA  --  134* 134*  K  --  4.5 3.8  CL  --  106 103  CO2  --  21* 24  GLUCOSE  --  129* 106*  BUN  --  21 15  CREATININE 1.20 1.42* 1.14  CALCIUM  --  7.7* 7.7*   GFR: Estimated Creatinine Clearance: 68.2 mL/min (by C-G formula based on SCr of 1.14 mg/dL).  CBG: Recent Labs  Lab 09/29/22 1701 09/29/22 2108 09/30/22 0748 09/30/22 1127 09/30/22 1631  GLUCAP 104* 140* 133* 111* 111*   Urine analysis:    Component Value Date/Time   COLORURINE YELLOW (A) 08/26/2022 2300   APPEARANCEUR CLEAR (A) 08/26/2022 2300   APPEARANCEUR Clear 10/04/2019 1128   LABSPEC >1.046 (H) 08/26/2022 2300   LABSPEC 1.019 06/16/2013 1607   PHURINE 6.0 08/26/2022 2300   GLUCOSEU NEGATIVE 08/26/2022 2300   GLUCOSEU Negative 06/16/2013 1607   HGBUR NEGATIVE 08/26/2022 2300   BILIRUBINUR NEGATIVE 08/26/2022 2300   BILIRUBINUR Negative 07/24/2020 1013   BILIRUBINUR Negative 10/04/2019 1128   BILIRUBINUR Negative 06/16/2013 1607   KETONESUR 20 (A) 08/26/2022 2300   PROTEINUR 30 (A) 08/26/2022 2300   UROBILINOGEN 0.2 07/24/2020 1013   NITRITE NEGATIVE 08/26/2022 2300   LEUKOCYTESUR NEGATIVE 08/26/2022 2300   LEUKOCYTESUR Negative 06/16/2013 1607   This document was prepared using Dragon Voice Recognition software and may include unintentional dictation errors.  Dr. Sedalia Muta [facility]  If 7PM-7AM, please contact overnight-coverage provider If  7AM-7PM, please contact day attending provider [email or web address]  09/30/2022, 8:21 PM

## 2022-09-30 NOTE — Assessment & Plan Note (Signed)
Patient is status post hand assisted laparoscopic right colectomy on 09/28/2022 with stapled ileocolostomy Per primary team

## 2022-09-30 NOTE — Assessment & Plan Note (Signed)
No CKD shown on labs today Continue to monitor

## 2022-09-30 NOTE — Evaluation (Addendum)
Physical Therapy Evaluation Patient Details Name: Paul Harkrader [name] MRN: [id number] DOB: [birth year] Today's Date: 09/30/2022  History of Present Illness  Patient is a 77 year old male with history of CL leukemia and chronic anemia and most recently hospitalized for a soft tissue infection of the upper left thigh, had I&D which showed improvements, but is not completely healed. PMH includes leukemia, anemia, DM, CKD. Pt is now s/p Laparoscopic right colectomy and RFNA for ventral hernia repair for right colon mass.  Clinical Impression  Pt was pleasant and willing to participate during the session and put forth good effort throughout. Pt is currently Mod I with STS transfers, once up pt was able to walk 200 feet with RW at [facility] with overall slow speed and short step length; cues provided for upright posture and improved R foot clearance, pt was able to correct foot clearance for rest of ambulatory bout. Pt will benefit from continued PT services upon discharge to safely address deficits listed in patient problem list for decreased caregiver assistance and eventual return to PLOF.         If plan is discharge home, recommend the following: A little help with walking and/or transfers;A little help with bathing/dressing/bathroom;Assistance with cooking/housework;Assist for transportation   Can travel by private vehicle        Equipment Recommendations None recommended by PT  Recommendations for Other Services       Functional Status Assessment Patient has had a recent decline in their functional status and demonstrates the ability to make significant improvements in function in a reasonable and predictable amount of time.     Precautions / Restrictions Precautions Precautions: Fall Restrictions Weight Bearing Restrictions: No      Mobility  Bed Mobility               General bed mobility comments: Pt found seated in recliner    Transfers Overall transfer level: Modified  independent Equipment used: Rolling walker (2 wheels) Transfers: Sit to/from Stand Sit to Stand: Modified independent (Device/Increase time)           General transfer comment: Extra time needed    Ambulation/Gait Ambulation/Gait assistance: Contact guard assist Gait Distance (Feet): 200 Feet Assistive device: Rolling walker (2 wheels) Gait Pattern/deviations: Step-through pattern, Decreased dorsiflexion - right, Decreased step length - right, Decreased stride length, Decreased step length - left Gait velocity: decreased     General Gait Details: Pt walking with steady steps, cued for foot clearence on RLE, pt was able to correct and maintain correction  Stairs            Wheelchair Mobility     Tilt Bed    Modified Rankin (Stroke Patients Only)       Balance Overall balance assessment: Needs assistance Sitting-balance support: Feet supported, No upper extremity supported Sitting balance-Leahy Scale: Good     Standing balance support: During functional activity, Bilateral upper extremity supported Standing balance-Leahy Scale: Good Standing balance comment: Static standing at RW                             Pertinent Vitals/Pain Pain Assessment Pain Assessment: 0-10 Pain Score: 5  Pain Location: abdomen Pain Descriptors / Indicators: Aching, Sore Pain Intervention(s): Monitored during session, Limited activity within patient's tolerance    Home Living Family/patient expects to be discharged to:: Private residence Living Arrangements: Spouse/significant other Available Help at Discharge: Family;Available PRN/intermittently Type of Home: House Home Access: Level  entry       Home Layout: One level Home Equipment: Rollator (4 wheels);BSC/3in1;Rolling Walker (2 wheels) Additional Comments: Pt reports home was built handicapped accessible    Prior Function Prior Level of Function : Independent/Modified Independent             Mobility  Comments: Ind, reports no use of AD at home in normal state of health ADLs Comments: Modified independent/independent in ADL's     Extremity/Trunk Assessment   Upper Extremity Assessment Upper Extremity Assessment: Overall WFL for tasks assessed    Lower Extremity Assessment Lower Extremity Assessment: Overall WFL for tasks assessed       Communication   Communication Communication: No apparent difficulties  Cognition Arousal: Alert Behavior During Therapy: WFL for tasks assessed/performed Overall Cognitive Status: Within Functional Limits for tasks assessed                                          General Comments      Exercises     Assessment/Plan    PT Assessment Patient needs continued PT services  PT Problem List Decreased range of motion;Decreased activity tolerance;Decreased balance;Decreased mobility;Decreased coordination;Decreased strength       PT Treatment Interventions DME instruction;Balance training;Gait training;Functional mobility training;Stair training;Therapeutic activities;Therapeutic exercise    PT Goals (Current goals can be found in the Care Plan section)  Acute Rehab PT Goals Patient Stated Goal: walk better PT Goal Formulation: With patient Time For Goal Achievement: 10/13/22 Potential to Achieve Goals: Good    Frequency Min 1X/week     Co-evaluation               AM-PAC PT "6 Clicks" Mobility  Outcome Measure Help needed turning from your back to your side while in a flat bed without using bedrails?: A Little Help needed moving from lying on your back to sitting on the side of a flat bed without using bedrails?: A Little Help needed moving to and from a bed to a chair (including a wheelchair)?: A Little Help needed standing up from a chair using your arms (e.g., wheelchair or bedside chair)?: A Little Help needed to walk in hospital room?: A Little Help needed climbing 3-5 steps with a railing? : A Little 6  Click Score: 18    End of Session Equipment Utilized During Treatment: Gait belt Activity Tolerance: Patient tolerated treatment well;Patient limited by pain Patient left: in chair;with call bell/phone within reach Nurse Communication: Mobility status PT Visit Diagnosis: Other abnormalities of gait and mobility (R26.89);Difficulty in walking, not elsewhere classified (R26.2);Pain Pain - Right/Left:  (abdomen) Pain - part of body:  (abdomen)    Time: 1610-9604 PT Time Calculation (min) (ACUTE ONLY): 23 min   Charges:                 Cecile Sheerer, SPT 09/30/22, 9:49 AM This entire session was performed under direct supervision and direction of a licensed therapist/therapist assistant. I have personally read, edited and approve of the note as written.  Loran Senters, DPT

## 2022-09-30 NOTE — Assessment & Plan Note (Deleted)
Per and at bedside, his baseline SBP is 160s to 170s I suspect that the initial readings of blood pressure of 215/96 was due to some of patient's and hypertensive medications intermittently being held for surgery and postop Hydralazine 10 mg IV every 6 hours prn for SBP greater 175

## 2022-09-30 NOTE — Plan of Care (Signed)
  Problem: Activity: Goal: Risk for activity intolerance will decrease Outcome: Progressing   Problem: Safety: Goal: Ability to remain free from injury will improve Outcome: Progressing   Problem: Skin Integrity: Goal: Risk for impaired skin integrity will decrease Outcome: Progressing   

## 2022-09-30 NOTE — Progress Notes (Signed)
[facility] SURGICAL PROGRESS NOTE  Hospital Day(s): 2.   Post op day(s): 2 Days Post-Op.   Interval History:  Patient seen and examined No acute events or new complaints overnight.  Patient reports he had a lot of pain last night; feeling a little better this AM No fever, chills, nausea, emesis  WBC normal at 9.0K Hgb 9.4 (from 9.7) AKI resolved; sCr - 1.14; UO - 1750 ccs He is on FLD; tolerating Passing flatus   Vital signs in last 24 hours: [min-max] current  Temp:  [98.3 F (36.8 C)-99.1 F (37.3 C)] 98.6 F (37 C) (09/05 0544) Pulse Rate:  [60-65] 63 (09/05 0613) Resp:  [14-16] 16 (09/05 0544) BP: (147-201)/(73-91) 154/79 (09/05 0613) SpO2:  [95 %-100 %] 98 % (09/05 0544)     Height: 6\' 1"  (185.4 cm) Weight: 98.9 kg BMI (Calculated): 28.77   Intake/Output last 2 shifts:  09/04 0701 - 09/05 0700 In: 350.1 [I.V.:350.1] Out: 1750 [Urine:1750]   Physical Exam:  Constitutional: alert, cooperative and no distress  Respiratory: breathing non-labored at rest  Cardiovascular: regular rate and sinus rhythm  Gastrointestinal: soft, expected incisional soreness, and non-distended, no rebound/guarding Genitourinary: Foley in place; good UO Integumentary: Laparoscopic incisions are CDI with dermabond, no erythema   Labs:     Latest Ref Rng & Units 09/30/2022    5:18 AM 09/29/2022    6:30 AM 09/28/2022    8:37 PM  CBC  WBC 4.0 - 10.5 K/uL 9.0  10.4  11.2   Hemoglobin 13.0 - 17.0 g/dL 9.4  9.7  16.1   Hematocrit 39.0 - 52.0 % 29.4  31.1  33.8   Platelets 150 - 400 K/uL 130  144  138       Latest Ref Rng & Units 09/30/2022    5:18 AM 09/29/2022    6:30 AM 09/28/2022    7:59 PM  CMP  Glucose 70 - 99 mg/dL 096  045    BUN 8 - 23 mg/dL 15  21    Creatinine 4.09 - 1.24 mg/dL 8.11  9.14  7.82   Sodium 135 - 145 mmol/L 134  134    Potassium 3.5 - 5.1 mmol/L 3.8  4.5    Chloride 98 - 111 mmol/L 103  106    CO2 22 - 32 mmol/L 24  21    Calcium 8.9 - 10.3 mg/dL 7.7   7.7       Imaging studies: No new pertinent imaging studies   Assessment/Plan:  77 y.o. male with now resolved AKI otherwise doing well 2 Days Post-Op s/p robotic assisted laparoscopic right hemicolectomy and ventral hernia repair for right colon mass   - Advance diet as tolerated; okay for soft diet later today if doing well   - Will get KUB to ensure no ileus/intra-abdominal etiology given significant pain   - Wean IVF  - Discontinue foley catheter today  - Monitor abdominal examination; on-going bowel function - Pain control prn; antiemetics prn - Mobilize; PT engaged   - Discharge Planning: He is doing okay, suspect abdominal pain is incisional pain. Not ready for DC today. Advance diet, remove foley. Hopefully home in 24-48 hours pending progress   All of the above findings and recommendations were discussed with the patient, and the medical team, and all of patient's questions were answered to his expressed satisfaction.  -- Lynden Oxford, PA-C [facility] 09/30/2022, 7:25 AM M-F: 7am - 4pm

## 2022-10-01 ENCOUNTER — Inpatient Hospital Stay: Payer: No Typology Code available for payment source

## 2022-10-01 DIAGNOSIS — I517 Cardiomegaly: Secondary | ICD-10-CM | POA: Diagnosis not present

## 2022-10-01 DIAGNOSIS — F1190 Opioid use, unspecified, uncomplicated: Secondary | ICD-10-CM

## 2022-10-01 DIAGNOSIS — D509 Iron deficiency anemia, unspecified: Secondary | ICD-10-CM

## 2022-10-01 DIAGNOSIS — I10 Essential (primary) hypertension: Secondary | ICD-10-CM | POA: Diagnosis not present

## 2022-10-01 DIAGNOSIS — E1122 Type 2 diabetes mellitus with diabetic chronic kidney disease: Secondary | ICD-10-CM | POA: Diagnosis not present

## 2022-10-01 DIAGNOSIS — N1831 Chronic kidney disease, stage 3a: Secondary | ICD-10-CM | POA: Diagnosis not present

## 2022-10-01 DIAGNOSIS — K6389 Other specified diseases of intestine: Secondary | ICD-10-CM | POA: Diagnosis not present

## 2022-10-01 LAB — TYPE AND SCREEN
ABO/RH(D): O POS
Antibody Screen: POSITIVE
DAT, IgG: NEGATIVE
DAT, complement: NEGATIVE
Unit division: 0
Unit division: 0
Unit division: 0
Unit division: 0

## 2022-10-01 LAB — BPAM RBC
Blood Product Expiration Date: 202409272359
Blood Product Expiration Date: 202410022359
Blood Product Expiration Date: 202410022359
Blood Product Expiration Date: 202410022359
ISSUE DATE / TIME: 202409041500
ISSUE DATE / TIME: 202409041500
Unit Type and Rh: 5100
Unit Type and Rh: 5100
Unit Type and Rh: 5100
Unit Type and Rh: 5100

## 2022-10-01 LAB — GLUCOSE, CAPILLARY
Glucose-Capillary: 101 mg/dL — ABNORMAL HIGH (ref 70–99)
Glucose-Capillary: 81 mg/dL (ref 70–99)
Glucose-Capillary: 170 mg/dL — ABNORMAL HIGH (ref 70–99)
Glucose-Capillary: 57 mg/dL — ABNORMAL LOW (ref 70–99)
Glucose-Capillary: 95 mg/dL (ref 70–99)

## 2022-10-01 MED ORDER — BOOST / RESOURCE BREEZE PO LIQD CUSTOM
1.0000 | Freq: Three times a day (TID) | ORAL | Status: DC
  Administered 2022-10-01 – 2022-10-04 (×8): 1 via ORAL

## 2022-10-01 MED ORDER — PROSOURCE PLUS PO LIQD
30.0000 mL | Freq: Three times a day (TID) | ORAL | Status: DC
  Administered 2022-10-01 – 2022-10-04 (×7): 30 mL via ORAL
  Filled 2022-10-01: qty 30

## 2022-10-01 NOTE — Progress Notes (Signed)
[facility] SURGICAL PROGRESS NOTE  Hospital Day(s): 3.   Post op day(s): 3 Days Post-Op.   Interval History:  Patient seen and examined No acute events or new complaints overnight.  Patient reports he Is doing okay this AM Abdominal pain improved but continues to feel distended No fever, chills, nausea, emesis  No new labs this AM Good UO - 2525 ccs He is on CLD; not hungry Passing flatus  Working with therapies   Vital signs in last 24 hours: [min-max] current  Temp:  [98.1 F (36.7 C)-98.7 F (37.1 C)] 98.6 F (37 C) (09/06 0117) Pulse Rate:  [58-67] 65 (09/06 0117) Resp:  [16-20] 20 (09/06 0117) BP: (147-182)/(73-108) 147/73 (09/06 0117) SpO2:  [97 %-100 %] 97 % (09/06 0117)     Height: 6\' 1"  (185.4 cm) Weight: 98.9 kg BMI (Calculated): 28.77   Intake/Output last 2 shifts:  09/05 0701 - 09/06 0700 In: 1138.8 [I.V.:1138.8] Out: 2525 [Urine:2525]   Physical Exam:  Constitutional: alert, cooperative and no distress  Respiratory: breathing non-labored at rest  Cardiovascular: regular rate and sinus rhythm  Gastrointestinal: soft, expected incisional soreness, he is slightly distended, no rebound/guarding Integumentary: Laparoscopic incisions are CDI with dermabond, no erythema   Labs:     Latest Ref Rng & Units 09/30/2022    5:18 AM 09/29/2022    6:30 AM 09/28/2022    8:37 PM  CBC  WBC 4.0 - 10.5 K/uL 9.0  10.4  11.2   Hemoglobin 13.0 - 17.0 g/dL 9.4  9.7  16.1   Hematocrit 39.0 - 52.0 % 29.4  31.1  33.8   Platelets 150 - 400 K/uL 130  144  138       Latest Ref Rng & Units 09/30/2022    5:18 AM 09/29/2022    6:30 AM 09/28/2022    7:59 PM  CMP  Glucose 70 - 99 mg/dL 096  045    BUN 8 - 23 mg/dL 15  21    Creatinine 4.09 - 1.24 mg/dL 8.11  9.14  7.82   Sodium 135 - 145 mmol/L 134  134    Potassium 3.5 - 5.1 mmol/L 3.8  4.5    Chloride 98 - 111 mmol/L 103  106    CO2 22 - 32 mmol/L 24  21    Calcium 8.9 - 10.3 mg/dL 7.7  7.7       Imaging  studies: No new pertinent imaging studies   Assessment/Plan:  77 y.o. male with likely developing ileus 3 Days Post-Op s/p robotic assisted laparoscopic right hemicolectomy and ventral hernia repair for right colon mass   - CLD until distension/nausea improve  - Will get morning KUB  - Monitor abdominal examination; on-going bowel function - Pain control prn; antiemetics prn - Morning labs - Mobilize; PT engaged   - Discharge Planning: Developing ileus, not ready.   All of the above findings and recommendations were discussed with the patient, and the medical team, and all of patient's questions were answered to his expressed satisfaction.  -- Lynden Oxford, PA-C [facility] 10/01/2022, 8:17 AM M-F: 7am - 4pm

## 2022-10-01 NOTE — Care Management Important Message (Signed)
Important Message  Patient Details  Name: Paul Torstenson [name] MRN: [id number] Date of Birth: 06/30/1945   Medicare Important Message Given:  N/A - LOS <3 / Initial given by admissions     [name] 10/01/2022, 8:58 AM

## 2022-10-01 NOTE — Progress Notes (Signed)
Nutrition Follow-up  DOCUMENTATION CODES:   Not applicable  INTERVENTION:   -D/c Ensure Enlive po TID, each supplement provides 350 kcal and 20 grams of protein -Boost Breeze po TID, each supplement provides 250 kcal and 9 grams of protein  -30 ml Prosource Plus TID, each supplement provides 100 kcals and 15 grams protein -MVI with minerals daily -RD will follow for diet advancement and adjust supplement regimen as appropriaye  NUTRITION DIAGNOSIS:   Increased nutrient needs related to post-op healing as evidenced by estimated needs.  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  9/3- s/p Hand assisted Laparoscopic Right Hemicolectomy with stapled ileocolostomy; Repair 1.5 incarcerated UH  9/5- clear liquid diet   Reviewed I/O's: -1.4 L x 24 hours and -1.7 L since admission  UOP: 2.5 L x 24 hours  Pt lying in bed at time of visit and woke up quickly when greeted by RD. Pt reports he slept well last night, but has very poor appetite and does not feel like eating secondary to nausea. Pt shares that nausea started yesterday and was told this it will likely occur over the next few days.   PTA pt had a good appetite. Pt usually consumes 2-3 meals per day, depending on his schedule. Both he and wife prepare meals and home and he also enjoys dining in the multiple restaurants around town. Pt is also very active, still works in the [employer].   Pt estimates weight loss over the past 2-3 months PTA. Pt shares that he first started losing weight when he was diagnosed with leukemia secondary to side effects from medications, but pt states "I think I've gained it all  back". Reviewed wt hx; pt has experienced a 8% wt loss over the past 2 months, which is significant for time frame. Pt also noted some decrease in muscle mass.   Discussed importance of good meal and supplement intake to promote healing. Pt amenable to supplements and expressed appreciation for visit.   Medications reviewed and include 0.9% sodium chloride infusion @ 50 ml/hr.   Labs reviewed: Na: 134, CBGS: 111-140 (inpatient orders for glycemic control are 0-15 units insulin aspart TID with meals, 0-5 units insulin aspart daily at bedtime, and 4 units insulin aspart TID with meals).    NUTRITION - FOCUSED PHYSICAL EXAM:  Flowsheet Row Most Recent Value  Orbital Region No depletion  Upper Arm Region No depletion  Thoracic and Lumbar Region No depletion  Buccal Region Mild depletion  Temple Region Mild depletion  Clavicle Bone Region No depletion  Clavicle and Acromion Bone Region No depletion  Scapular Bone Region No depletion  Dorsal Hand No depletion  Patellar Region No depletion  Anterior Thigh Region No depletion  Posterior Calf Region No depletion  Edema (RD Assessment) None  Hair Reviewed  Eyes Reviewed  Mouth Reviewed  Skin Reviewed  Nails Reviewed       Diet Order:   Diet Order             Diet clear liquid Fluid consistency: Thin  Diet effective now  EDUCATION NEEDS:   No education needs have been identified at this time  Skin:  Skin Assessment: Skin Integrity Issues: Skin Integrity Issues:: Incisions Incisions: closed abdomen  Last BM:  Unknown  Height:   Ht Readings from Last 1 Encounters:  09/28/22 6\' 1"  (1.854 m)    Weight:   Wt Readings from Last 1 Encounters:  09/28/22 98.9 kg    Ideal Body Weight:  83.6 kg  BMI:  Body mass index is 28.77 kg/m.  Estimated Nutritional Needs:   Kcal:  2100-2300  Protein:  115-130 grams  Fluid:  > 2 L    Levada Schilling, RD, LDN, CDCES Registered Dietitian II Certified  Diabetes Care and Education Specialist Please refer to [facility] for RD and/or RD on-call/weekend/after hours pager

## 2022-10-01 NOTE — Progress Notes (Signed)
Mobility Specialist - Progress Note   10/01/22 1107  Mobility  Activity Ambulated with assistance in hallway;Dangled on edge of bed;Stood at bedside;Ambulated with assistance in room  Level of Assistance Standby assist, set-up cues, supervision of patient - no hands on  Assistive Device None  Distance Ambulated (ft) 145 ft  Activity Response Tolerated well  $Mobility charge 1 Mobility  Mobility Specialist Start Time (ACUTE ONLY) 1054  Mobility Specialist Stop Time (ACUTE ONLY) 1104  Mobility Specialist Time Calculation (min) (ACUTE ONLY) 10 min   Pt supine upon entry, utilizing RA. Pt expressed "not feeling well" stating " it's just my stomach", however agreeable to OOB amb in the hallway-- max encouragement required. Pt completed bed mob and STS ModI. Pt amb one lap around B-pod, holding onto the railing and object during amb. Pt return to the room, left supine with alarm set and needs within reach.  Zetta Bills Mobility Specialist 10/01/22 11:17 AM

## 2022-10-01 NOTE — TOC Initial Note (Signed)
Transition of Care (TOC) - Initial/Assessment Note    Patient Details  Name: Paul Corvi [name] MRN: [id number] Date of Birth: [birth year]  Transition of Care [facility]) CM/SW Contact:    Margarito Liner, LCSW Phone Number: 10/01/2022, 9:52 AM  Clinical Narrative:   Readmission prevention screen complete. CSW met with patient. No supports at bedside. CSW introduced role and explained that discharge planning would be discussed. PCP is Debbra Riding, PA-C. Patient drives himself to his appointments. [facility] [address]. No issues obtaining medications. Patient lives home with his wife. No home health prior to admission. He was set up with [facility] in early August but then declined their services on 8/14. Patient is not interested in home health at this time. CSW encouraged him to notify [facility] team if he changes his mind prior to admission or his PCP if he changes his mind after discharge. He has a wheelchair, RW, shower stool, and BSC at home. No further concerns. CSW will continue to follow patient for support and facilitate return home once stable. His daughter or son will transport him home at discharge.           Expected Discharge Plan: Home/Self Care Barriers to Discharge: Continued Medical Work up   Patient Goals and CMS Choice            Expected Discharge Plan and Services     Post Acute Care Choice: NA Living arrangements for the past 2 months: Single Family Home                                      Prior Living Arrangements/Services Living arrangements for the past 2 months: Single Family Home Lives with:: Spouse Patient language and need for interpreter reviewed:: Yes Do you feel safe going back to the place where you live?: Yes      Need for Family Participation in Patient Care: Yes (Comment) Care giver support system in place?: Yes (comment) Current home services: DME Criminal Activity/Legal Involvement Pertinent to Current  Situation/Hospitalization: No - Comment as needed  Activities of Daily Living Home Assistive Devices/Equipment: None ADL Screening (condition at time of admission) Patient's cognitive ability adequate to safely complete daily activities?: Yes Is the patient deaf or have difficulty hearing?: No Does the patient have difficulty seeing, even when wearing glasses/contacts?: No Does the patient have difficulty concentrating, remembering, or making decisions?: No Patient able to express need for assistance with ADLs?: Yes Does the patient have difficulty dressing or bathing?: No Independently performs ADLs?: Yes (appropriate for developmental age) Does the patient have difficulty walking or climbing stairs?: No Weakness of Legs: None Weakness of Arms/Hands: None  Permission Sought/Granted                  Emotional Assessment Appearance:: Appears stated age Attitude/Demeanor/Rapport: Engaged, Gracious Affect (typically observed): Accepting, Appropriate, Calm, Pleasant Orientation: : Oriented to Self, Oriented to Place, Oriented to  Time, Oriented to Situation Alcohol / Substance Use: Not Applicable Psych Involvement: No (comment)  Admission diagnosis:  Mass of colon [K63.89] Patient Active Problem List   Diagnosis Date Noted   Mass of colon 09/28/2022   Colonic mass 09/03/2022   Abscess of muscle of thigh 08/28/2022   Cellulitis of left groin 08/27/2022   Fall 08/27/2022   Abscess of left groin 08/27/2022   Frequent falls 08/26/2022   Elevated troponin 08/26/2022   Contusion  of groin, left, initial encounter 08/26/2022   Chronic, continuous use of opioids 08/26/2022   Cardiomyopathy (HCC) 08/26/2022   Acute on chronic anemia 08/20/2022   Cough 08/06/2022   Transaminitis 05/21/2022   Hypokalemia 05/21/2022   Primary osteoarthritis of right shoulder 02/25/2022   Chronic right shoulder pain 02/25/2022   Cervical radicular pain 02/25/2022   Encounter for antineoplastic  chemotherapy 02/06/2022   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/01/2022   Goals of care, counseling/discussion 01/01/2022   IDA (iron deficiency anemia) 01/01/2022   Right ankle pain 12/25/2021   Leukocytosis 12/25/2021   Type II diabetes mellitus (HCC) 12/25/2021   Acute renal failure superimposed on stage 3a chronic kidney disease (HCC) 12/25/2021   HLD (hyperlipidemia) 12/25/2021   HTN (hypertension) 12/25/2021   Obesity with body mass index (BMI) of 30.0 to 39.9 12/25/2021   Right knee pain 12/25/2021   Left wrist pain 12/25/2021   Cellulitis of right lower extremity 12/25/2021   Septic infrapatellar bursitis of right knee 12/25/2021   Class 1 obesity without serious comorbidity with body mass index (BMI) of 30.0 to 30.9 in adult 10/02/2021   Encounter for long-term opiate analgesic use 06/04/2021   Chronic radicular lumbar pain 04/07/2021   Lumbar facet arthropathy 04/07/2021   Lumbar degenerative disc disease 04/07/2021   Primary osteoarthritis of left shoulder 11/06/2020   Left rotator cuff tear arthropathy 11/06/2020   Spinal stenosis, lumbar region, with neurogenic claudication 11/06/2020   Chronic left shoulder pain 11/06/2020   Chronic pain syndrome 11/06/2020   Pain management contract signed 11/06/2020   OSA on CPAP 08/25/2020   CPAP use counseling 08/25/2020   Disorder of rotator cuff, left 06/10/2020   OSA (obstructive sleep apnea) 05/05/2020   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   COVID 02/19/2020   Headache above the eye region 09/11/2019   Adrenal nodule (HCC) 10/28/2015   Subdural hematoma (HCC) 10/24/2015   Sepsis (HCC) 07/19/2015   Essential hypertension 07/19/2015   Diabetes mellitus (HCC) 07/19/2015   Hypothyroidism 07/19/2015   Peripheral neuropathy 07/19/2015   Chronic back pain 07/19/2015   CKD (chronic kidney disease) stage 3, GFR 30-59 ml/min (HCC) 07/19/2015   Enlarged prostate 03/25/2015   Urine  stream spraying 03/25/2015   BPH with obstruction/lower urinary tract symptoms 09/18/2014   Other male erectile dysfunction 09/18/2014   Sleep apnea 04/04/2014   PCP:  Wilford Corner, PA-C Pharmacy:   [facility] [address]), [place] - [address] Jerilynn Mages [place]) [place] [address] Phone: [phone] Fax: [phone]  [facility] - [place], [place] - [address] [place] [address] Phone: [phone] Fax: [phone]  [facility] - [place], [place] - [address] [place] [address] Phone: [phone] Fax: [phone]  [facility] [address] [place] [address] Phone: [phone] Fax: [phone]  [facility] - [facility] [address] [place] [address] Phone: [phone] Fax: [phone]     Social Determinants of Health (SDOH) Social History: SDOH Screenings   Food Insecurity: No Food Insecurity (09/28/2022)  Recent Concern: Food Insecurity - Food Insecurity Present (07/26/2022)  Housing: Medium Risk (09/28/2022)  Transportation Needs: No Transportation Needs (09/28/2022)  Utilities: Not At Risk (09/28/2022)  Alcohol Screen: Low Risk  (01/22/2022)  Depression (PHQ2-9): Low Risk  (08/19/2022)  Financial Resource Strain: Medium Risk (07/26/2022)  Physical  Activity: Unknown (07/26/2022)  Social Connections: Moderately Integrated (07/26/2022)  Stress: Stress Concern Present (07/26/2022)  Tobacco Use: Medium Risk (09/30/2022)   SDOH Interventions:     Readmission Risk Interventions    10/01/2022    9:51 AM 08/29/2022   10:57 AM  Readmission Risk Prevention Plan  Transportation Screening Complete Complete  PCP or Specialist Appt within 3-5 Days Complete   HRI or Home Care Consult Patient refused   Social Work Consult for Recovery Care  Planning/Counseling Complete   Palliative Care Screening Not Applicable   Medication Review Oceanographer) Complete Complete  PCP or Specialist appointment within 3-5 days of discharge  Complete  HRI or Home Care Consult  Complete  SW Recovery Care/Counseling Consult  Complete  Palliative Care Screening  Not Applicable  Skilled Nursing Facility  Complete

## 2022-10-01 NOTE — Plan of Care (Signed)
  Problem: Clinical Measurements: Goal: Diagnostic test results will improve Outcome: Progressing   Problem: Clinical Measurements: Goal: Signs and symptoms of infection will decrease Outcome: Progressing   Problem: Physical Regulation: Goal: Complications related to the disease process, condition or treatment will be avoided or minimized Outcome: Progressing   Problem: Education: Goal: Ability to describe self-care measures that may prevent or decrease complications (Diabetes Survival Skills Education) will improve Outcome: Progressing

## 2022-10-01 NOTE — Progress Notes (Signed)
Physical Therapy Treatment Patient Details Name: Paul Rudy [name] MRN: [id number] DOB: May 29, 1945 Today's Date: 10/01/2022   History of Present Illness Patient is a 77 year old male with history of CL leukemia and chronic anemia and most recently hospitalized for a soft tissue infection of the upper left thigh, had I&D which showed improvements, but is not completely healed. PMH includes leukemia, anemia, DM, CKD. Pt is now s/p Laparoscopic right colectomy and RFNA for ventral hernia repair for right colon mass.    PT Comments  Pt was pleasant and with cues and encouragement pt was willing to participate during the session and put forth good effort throughout. Began session educating pt on importance of frequent mobility to ensure maintenance of functional activity tolerance. Pt currently performing bed mobility and transfers with Mod I, only needing increased time to complete. Once standing pt able to amb 3 consecutive laps, plus one additional lap around B-pod using RW with CGA throughout. Gait somewhat slowed compared to pt reported normal, but otherwise pt walking with good gait quality, and improved foot clearance compared to prior session; pt having no concerns of SoB or lightheadedness. Pt will benefit from continued PT services upon discharge to safely address deficits listed in patient problem list for decreased caregiver assistance and eventual return to PLOF.      If plan is discharge home, recommend the following: A little help with walking and/or transfers;A little help with bathing/dressing/bathroom;Assistance with cooking/housework;Assist for transportation   Can travel by private vehicle        Equipment Recommendations  None recommended by PT    Recommendations for Other Services       Precautions / Restrictions Precautions Precautions: Fall Restrictions Weight Bearing Restrictions: No     Mobility  Bed Mobility Overal bed mobility: Modified Independent Bed  Mobility: Supine to Sit     Supine to sit: Modified independent (Device/Increase time)     General bed mobility comments: increased time    Transfers Overall transfer level: Modified independent Equipment used: Rolling walker (2 wheels) Transfers: Sit to/from Stand Sit to Stand: Modified independent (Device/Increase time)           General transfer comment: increased time    Ambulation/Gait   Gait Distance (Feet): 300 Feet x 1, 100 feet x 1 Assistive device: Rolling walker (2 wheels) Gait Pattern/deviations: Step-through pattern, Decreased dorsiflexion - right, Decreased step length - right, Decreased stride length, Decreased step length - left Gait velocity: decreased     General Gait Details: Pt continues to walk with good gait quality. steady foot clearance today compared to prior session, cues given to keep steady pace.   Stairs             Wheelchair Mobility     Tilt Bed    Modified Rankin (Stroke Patients Only)       Balance Overall balance assessment: Needs assistance Sitting-balance support: Feet supported, No upper extremity supported Sitting balance-Leahy Scale: Good     Standing balance support: During functional activity, Bilateral upper extremity supported Standing balance-Leahy Scale: Good Standing balance comment: Static standing at RW                            Cognition Arousal: Alert Behavior During Therapy: [facility] for tasks assessed/performed Overall Cognitive Status: Within Functional Limits for tasks assessed  Exercises Other Exercises Other Exercises: Pt provided education on importance of frequent mobility in order to maintain funcitonal activity tolerance    General Comments        Pertinent Vitals/Pain Pain Assessment Pain Assessment: 0-10 Pain Score: 7  Pain Location: abdomen Pain Descriptors / Indicators: Aching, Sore Pain Intervention(s):  Monitored during session    Home Living                          Prior Function            PT Goals (current goals can now be found in the care plan section) Progress towards PT goals: Progressing toward goals    Frequency    Min 1X/week      PT Plan      Co-evaluation              AM-PAC PT "6 Clicks" Mobility   Outcome Measure  Help needed turning from your back to your side while in a flat bed without using bedrails?: A Little Help needed moving from lying on your back to sitting on the side of a flat bed without using bedrails?: None Help needed moving to and from a bed to a chair (including a wheelchair)?: None Help needed standing up from a chair using your arms (e.g., wheelchair or bedside chair)?: None Help needed to walk in hospital room?: A Little Help needed climbing 3-5 steps with a railing? : A Little 6 Click Score: 21    End of Session Equipment Utilized During Treatment: Gait belt Activity Tolerance: Patient tolerated treatment well;Patient limited by pain Patient left: in chair;with call bell/phone within reach;with chair alarm set Nurse Communication: Mobility status PT Visit Diagnosis: Other abnormalities of gait and mobility (R26.89);Difficulty in walking, not elsewhere classified (R26.2);Pain     Time: 4098-1191 PT Time Calculation (min) (ACUTE ONLY): 18 min  Charges:                            Cecile Sheerer, SPT 10/01/22, 3:01 PM

## 2022-10-01 NOTE — Plan of Care (Signed)
  Problem: Pain Managment: Goal: General experience of comfort will improve Outcome: Progressing   Problem: Safety: Goal: Ability to remain free from injury will improve Outcome: Progressing   Problem: Nutrition: Goal: Adequate nutrition will be maintained Outcome: Progressing   

## 2022-10-01 NOTE — Progress Notes (Signed)
Consultation Progress Note   Paul Briggs: Paul Briggs ZOX:[id number] DOB: [birth year] DOA: 09/28/2022 DOS: the Paul Briggs was seen and examined on 10/01/2022 Primary service: Leafy Ro, MD  Brief hospital course: Paul Briggs, [name] is a 77 year old male with history of hypertension, hypothyroid, non-insulin-dependent diabetes mellitus, hyperlipidemia, who was admitted under general surgery service for hand-assisted laparoscopic right hemicolectomy with stapled ileocolostomy on 09/28/2022.  Hospitalist service was consulted for medical management of hypertension and diabetes.  Assessment and Plan: * Mass of colon Paul Briggs is status post hand assisted laparoscopic right colectomy on 09/28/2022 with stapled ileocolostomy Management as per primary team  Essential hypertension Currently Paul Briggs's blood pressure is within Paul Briggs's normal range Continue Hydralazine 10 mg IV every 6 hours prn for SBP greater 175  Chronic, continuous use of opioids Chronic pain syndrome PDMP reviewed  CKD (chronic kidney disease) stage 3, GFR 30-59 ml/min (HCC) Creatinine stable. Continue to monitor  Type 2 diabetes mellitus- Blood sugars stable. Continue accucheks, sliding scale insulin. Novolog 4 units with meals. Home meds - Farxiga, Metformin held while inpatient.     [facility] will continue to follow the Paul Briggs.  Subjective: Paul Briggs is seen and examined today morning. He is lying in bed, states has nausea. Did not eat well today. Abdominal pain better though.  Physical Exam: Vitals:   09/30/22 2332 09/30/22 2349 10/01/22 0117 10/01/22 0844  BP: (!) 182/108 (!) 171/90 (!) 147/73 (!) 153/95  Pulse: 67  65 67  Resp:   20 18  Temp:   98.6 F (37 C) 98 F (36.7 C)  TempSrc:   Oral   SpO2:   97% 98%  Weight:      Height:       General - Elderly African-American male, distress due to nausea HEENT - PERRLA, EOMI, atraumatic head, non tender sinuses. Lung - Clear, rales, rhonchi,  wheezes. Heart - S1, S2 heard, no murmurs, rubs, trace pedal edema. Abdomen soft, nontender, surgical incisions noted. Neuro - Alert, awake and oriented x 3, non focal exam. Skin - Warm and dry. Data Reviewed:     Latest Ref Rng & Units 09/30/2022    5:18 AM 09/29/2022    6:30 AM 09/28/2022    8:37 PM  CBC  WBC 4.0 - 10.5 K/uL 9.0  10.4  11.2   Hemoglobin 13.0 - 17.0 g/dL 9.4  9.7  81.1   Hematocrit 39.0 - 52.0 % 29.4  31.1  33.8   Platelets 150 - 400 K/uL 130  144  138       Latest Ref Rng & Units 09/30/2022    5:18 AM 09/29/2022    6:30 AM 09/28/2022    7:59 PM  BMP  Glucose 70 - 99 mg/dL 914  782    BUN 8 - 23 mg/dL 15  21    Creatinine 9.56 - 1.24 mg/dL 2.13  0.86  5.78   Sodium 135 - 145 mmol/L 134  134    Potassium 3.5 - 5.1 mmol/L 3.8  4.5    Chloride 98 - 111 mmol/L 103  106    CO2 22 - 32 mmol/L 24  21    Calcium 8.9 - 10.3 mg/dL 7.7  7.7       Family Communication: Paul Briggs understands current care plan, further communication per primary team.  Time spent: 41 minutes.  Author: Marcelino Duster, MD 10/01/2022 3:58 PM  For on call review [email or web address].

## 2022-10-02 DIAGNOSIS — K6389 Other specified diseases of intestine: Secondary | ICD-10-CM | POA: Diagnosis not present

## 2022-10-02 DIAGNOSIS — E1122 Type 2 diabetes mellitus with diabetic chronic kidney disease: Secondary | ICD-10-CM | POA: Diagnosis not present

## 2022-10-02 DIAGNOSIS — I10 Essential (primary) hypertension: Secondary | ICD-10-CM | POA: Diagnosis not present

## 2022-10-02 DIAGNOSIS — N1831 Chronic kidney disease, stage 3a: Secondary | ICD-10-CM | POA: Diagnosis not present

## 2022-10-02 LAB — CBC
HCT: 31.1 % — ABNORMAL LOW (ref 39.0–52.0)
Hemoglobin: 9.5 g/dL — ABNORMAL LOW (ref 13.0–17.0)
MCH: 29.1 pg (ref 26.0–34.0)
MCHC: 30.5 g/dL (ref 30.0–36.0)
MCV: 95.4 fL (ref 80.0–100.0)
Platelets: 151 10*3/uL (ref 150–400)
RBC: 3.26 MIL/uL — ABNORMAL LOW (ref 4.22–5.81)
RDW: 21.5 % — ABNORMAL HIGH (ref 11.5–15.5)
WBC: 9.5 10*3/uL (ref 4.0–10.5)
nRBC: 0 % (ref 0.0–0.2)

## 2022-10-02 LAB — BASIC METABOLIC PANEL
Anion gap: 6 (ref 5–15)
BUN: 12 mg/dL (ref 8–23)
CO2: 22 mmol/L (ref 22–32)
Calcium: 8.2 mg/dL — ABNORMAL LOW (ref 8.9–10.3)
Chloride: 109 mmol/L (ref 98–111)
Creatinine, Ser: 0.91 mg/dL (ref 0.61–1.24)
GFR, Estimated: >60 mL/min (ref >60)
Glucose, Bld: 99 mg/dL (ref 70–99)
Potassium: 3.8 mmol/L (ref 3.5–5.1)
Sodium: 137 mmol/L (ref 135–145)

## 2022-10-02 LAB — PHOSPHORUS: Phosphorus: 2 mg/dL — ABNORMAL LOW (ref 2.5–4.6)

## 2022-10-02 LAB — GLUCOSE, CAPILLARY
Glucose-Capillary: 95 mg/dL (ref 70–99)
Glucose-Capillary: 140 mg/dL — ABNORMAL HIGH (ref 70–99)
Glucose-Capillary: 94 mg/dL (ref 70–99)
Glucose-Capillary: 101 mg/dL — ABNORMAL HIGH (ref 70–99)

## 2022-10-02 LAB — MAGNESIUM: Magnesium: 2 mg/dL (ref 1.7–2.4)

## 2022-10-02 MED ORDER — KETOROLAC TROMETHAMINE 15 MG/ML IJ SOLN
15.0000 mg | Freq: Four times a day (QID) | INTRAMUSCULAR | Status: AC
  Administered 2022-10-02 – 2022-10-05 (×12): 15 mg via INTRAVENOUS
  Filled 2022-10-02 (×11): qty 1

## 2022-10-02 MED ORDER — PREGABALIN 50 MG PO CAPS
50.0000 mg | ORAL_CAPSULE | Freq: Three times a day (TID) | ORAL | Status: DC
  Administered 2022-10-02 – 2022-10-06 (×13): 50 mg via ORAL
  Filled 2022-10-02 (×13): qty 1

## 2022-10-02 NOTE — Progress Notes (Signed)
POD # 4 Ileus Still not hungry Some flatus and liquid BM AVSS Wbc nml Creat nml  PE NAD Abd: soft mildly distended w/o peritonitis, wound w/o infection  A/P Persistent ileus will take it slow No evidence of surgical complications at this time mobilize

## 2022-10-02 NOTE — Progress Notes (Signed)
Consultation Progress Note   Patient: Paul Briggs PXT:[id number] DOB: [birth year] DOA: 09/28/2022 DOS: the patient was seen and examined on 10/02/2022 Primary service: Leafy Ro, MD  Brief hospital course: [name], [name] is a 77 year old male with history of hypertension, hypothyroid, non-insulin-dependent diabetes mellitus, hyperlipidemia, who was admitted under general surgery service for hand-assisted laparoscopic right hemicolectomy with stapled ileocolostomy on 09/28/2022.  Hospitalist service was consulted for medical management of hypertension and diabetes.  Assessment and Plan: * Mass of colon Patient is status post hand assisted laparoscopic right colectomy on 09/28/2022 with stapled ileocolostomy Management as per primary team  Essential hypertension Currently patient's blood pressure is within patient's normal range Continue Hydralazine 10 mg IV every 6 hours prn for SBP greater 175  Chronic, continuous use of opioids Chronic pain syndrome PDMP reviewed  CKD (chronic kidney disease) stage 3, GFR 30-59 ml/min (HCC) Creatinine stable. Continue to monitor  Type 2 diabetes mellitus- Blood sugars lower side yesterday. Continue accucheks, sliding scale insulin. Stopped novolog 4 units with meals due to hypoglycemia. Hypoglycemia protocol. Home meds - Farxiga, Metformin held while inpatient.     [facility] will continue to follow the patient.  Subjective: Patient is seen and examined today morning. He is lying in bed, Nausea and abdominal pain better. Sugars lower side as he did not eat well yesterday. Ate better this morning.  Physical Exam: Vitals:   10/01/22 0844 10/01/22 1910 10/02/22 0153 10/02/22 0913  BP: (!) 153/95 (!) 145/79 (!) 186/92 (!) 156/77  Pulse: 67 66 94 63  Resp: 18 20 20 18   Temp: 98 F (36.7 C) 98.5 F (36.9 C) 98.9 F (37.2 C) 98.2 F (36.8 C)  TempSrc:  Oral Oral Oral  SpO2: 98% 97% 100% 99%  Weight:      Height:        General - Elderly African-American male, distress due to nausea HEENT - PERRLA, EOMI, atraumatic head, non tender sinuses. Lung - Clear, rales, rhonchi, wheezes. Heart - S1, S2 heard, no murmurs, rubs, trace pedal edema. Abdomen soft, nontender, surgical incisions noted. Neuro - Alert, awake and oriented x 3, non focal exam. Skin - Warm and dry. Data Reviewed:     Latest Ref Rng & Units 10/02/2022    7:07 AM 09/30/2022    5:18 AM 09/29/2022    6:30 AM  CBC  WBC 4.0 - 10.5 K/uL 9.5  9.0  10.4   Hemoglobin 13.0 - 17.0 g/dL 9.5  9.4  9.7   Hematocrit 39.0 - 52.0 % 31.1  29.4  31.1   Platelets 150 - 400 K/uL 151  130  144       Latest Ref Rng & Units 10/02/2022    7:07 AM 09/30/2022    5:18 AM 09/29/2022    6:30 AM  BMP  Glucose 70 - 99 mg/dL 99  627  035   BUN 8 - 23 mg/dL 12  15  21    Creatinine 0.61 - 1.24 mg/dL 0.09  3.81  8.29   Sodium 135 - 145 mmol/L 137  134  134   Potassium 3.5 - 5.1 mmol/L 3.8  3.8  4.5   Chloride 98 - 111 mmol/L 109  103  106   CO2 22 - 32 mmol/L 22  24  21    Calcium 8.9 - 10.3 mg/dL 8.2  7.7  7.7      Family Communication: Patient understands current care plan, further communication per primary team.  Time spent: 81  minutes.  Author: Marcelino Duster, MD 10/02/2022 4:34 PM  For on call review [email or web address].

## 2022-10-03 DIAGNOSIS — K6389 Other specified diseases of intestine: Secondary | ICD-10-CM | POA: Diagnosis not present

## 2022-10-03 DIAGNOSIS — E1122 Type 2 diabetes mellitus with diabetic chronic kidney disease: Secondary | ICD-10-CM | POA: Diagnosis not present

## 2022-10-03 DIAGNOSIS — I10 Essential (primary) hypertension: Secondary | ICD-10-CM | POA: Diagnosis not present

## 2022-10-03 DIAGNOSIS — N1831 Chronic kidney disease, stage 3a: Secondary | ICD-10-CM | POA: Diagnosis not present

## 2022-10-03 LAB — GLUCOSE, CAPILLARY
Glucose-Capillary: 134 mg/dL — ABNORMAL HIGH (ref 70–99)
Glucose-Capillary: 131 mg/dL — ABNORMAL HIGH (ref 70–99)
Glucose-Capillary: 179 mg/dL — ABNORMAL HIGH (ref 70–99)
Glucose-Capillary: 77 mg/dL (ref 70–99)

## 2022-10-03 MED ORDER — HYDRALAZINE HCL 50 MG PO TABS
50.0000 mg | ORAL_TABLET | Freq: Three times a day (TID) | ORAL | Status: DC
  Administered 2022-10-03 – 2022-10-06 (×10): 50 mg via ORAL
  Filled 2022-10-03 (×10): qty 1

## 2022-10-03 NOTE — Progress Notes (Addendum)
Mobility Specialist - Progress Note   10/03/22 0835  Mobility  Activity Stood at bedside;Dangled on edge of bed;Ambulated with assistance to bathroom  Level of Assistance Standby assist, set-up cues, supervision of patient - no hands on  Assistive Device None  Distance Ambulated (ft) 20 ft  Activity Response Tolerated well  Mobility Referral Yes  $Mobility charge 1 Mobility  Mobility Specialist Start Time (ACUTE ONLY) [id number]  Mobility Specialist Stop Time (ACUTE ONLY) [id number]  Mobility Specialist Time Calculation (min) (ACUTE ONLY) 14 min   Author responds to bed alarm going off. Pt EOB requesting to go to bathroom. Pt STS and ambulates to/from bathroom SBA with no LOB noted. Pt left in recliner with needs in reach and chair alarm activated.     Terrilyn Saver  Mobility Specialist  10/03/22 8:37 AM

## 2022-10-03 NOTE — Progress Notes (Signed)
Dehiscence not noted, but a small amount of blood was noted on umbilical incision site. Patient stated he was coughing. Reminded to use a pillow when coughing. Dr. Ardeen Fillers aware.

## 2022-10-03 NOTE — Progress Notes (Signed)
Consultation Progress Note   Patient: Paul Briggs [id number]:[id number] DOB: [birth year] DOA: 09/28/2022 DOS: the patient was seen and examined on 10/03/2022 Primary service: Leafy Ro, MD  Brief hospital course: Paul Briggs, [name] is a 77 year old male with history of hypertension, hypothyroid, non-insulin-dependent diabetes mellitus, hyperlipidemia, who was admitted under general surgery service for hand-assisted laparoscopic right hemicolectomy with stapled ileocolostomy on 09/28/2022.  Hospitalist service was consulted for medical management of hypertension and diabetes.  Assessment and Plan: * Mass of colon Patient is status post hand assisted laparoscopic right colectomy on 09/28/2022 with stapled ileocolostomy Management as per primary team  Essential hypertension BP higher side. Continue Toprol XL, Entresto. His Hydralazine dose increased to 50mg  TID. Continue Hydralazine 10 mg IV every 6 hours prn for SBP greater 175  Chronic, continuous use of opioids Chronic pain syndrome PDMP reviewed  CKD (chronic kidney disease) stage 3, GFR 30-59 ml/min (HCC) Creatinine stable. Continue to monitor  Type 2 diabetes mellitus- Blood sugars Improved. Continue accucheks, sliding scale insulin. Stopped novolog 4 units with meals due to hypoglycemia. Hypoglycemia protocol. Home meds - Farxiga, Metformin can be resumed at DC.     [facility] will continue to follow the patient.  Subjective: Patient is seen and examined today morning. Paul Briggs is sitting in chair, eating well. Abdominal pain, nausea better. Eating well. No overnight issues.  Physical Exam: Vitals:   10/02/22 1656 10/02/22 1904 10/03/22 0335 10/03/22 0905  BP: (!) 150/86 (!) 165/81 (!) 159/74 (!) 141/85  Pulse: 68 62 63 85  Resp: 18 20 16 18   Temp: 98.6 F (37 C) 98.2 F (36.8 C) 98.6 F (37 C) (!) 100.4 F (38 C)  TempSrc: Oral Oral Oral Oral  SpO2: 100% 100% 100% 98%  Weight:      Height:       General -  Elderly African-American male, distress due to nausea HEENT - PERRLA, EOMI, atraumatic head, non tender sinuses. Lung - Clear, rales, rhonchi, wheezes. Heart - S1, S2 heard, no murmurs, rubs, trace pedal edema. Abdomen soft, nontender, surgical incisions noted. Neuro - Alert, awake and oriented x 3, non focal exam. Skin - Warm and dry. Data Reviewed:     Latest Ref Rng & Units 10/02/2022    7:07 AM 09/30/2022    5:18 AM 09/29/2022    6:30 AM  CBC  WBC 4.0 - 10.5 K/uL 9.5  9.0  10.4   Hemoglobin 13.0 - 17.0 g/dL 9.5  9.4  9.7   Hematocrit 39.0 - 52.0 % 31.1  29.4  31.1   Platelets 150 - 400 K/uL 151  130  144       Latest Ref Rng & Units 10/02/2022    7:07 AM 09/30/2022    5:18 AM 09/29/2022    6:30 AM  BMP  Glucose 70 - 99 mg/dL 99  518  841   BUN 8 - 23 mg/dL 12  15  21    Creatinine 0.61 - 1.24 mg/dL 6.60  6.30  1.60   Sodium 135 - 145 mmol/L 137  134  134   Potassium 3.5 - 5.1 mmol/L 3.8  3.8  4.5   Chloride 98 - 111 mmol/L 109  103  106   CO2 22 - 32 mmol/L 22  24  21    Calcium 8.9 - 10.3 mg/dL 8.2  7.7  7.7      Family Communication: Patient understands current care plan, further communication per primary team.  Time spent: 40 minutes.  Author: Marcelino Duster, MD 10/03/2022 1:46 PM  For on call review [email or web address].

## 2022-10-03 NOTE — Plan of Care (Signed)
  Problem: Fluid Volume: Goal: Hemodynamic stability will improve Outcome: Progressing   Problem: Clinical Measurements: Goal: Diagnostic test results will improve Outcome: Progressing Goal: Signs and symptoms of infection will decrease Outcome: Progressing   Problem: Respiratory: Goal: Ability to maintain adequate ventilation will improve Outcome: Progressing   Problem: Education: Goal: Knowledge of condition and prescribed therapy will improve Outcome: Progressing   Problem: Cardiac: Goal: Will achieve and/or maintain adequate cardiac output Outcome: Progressing   Problem: Physical Regulation: Goal: Complications related to the disease process, condition or treatment will be avoided or minimized Outcome: Progressing   Problem: Education: Goal: Ability to describe self-care measures that may prevent or decrease complications (Diabetes Survival Skills Education) will improve Outcome: Progressing Goal: Individualized Educational Video(s) Outcome: Progressing   Problem: Coping: Goal: Ability to adjust to condition or change in health will improve Outcome: Progressing   Problem: Fluid Volume: Goal: Ability to maintain a balanced intake and output will improve Outcome: Progressing   Problem: Health Behavior/Discharge Planning: Goal: Ability to identify and utilize available resources and services will improve Outcome: Progressing Goal: Ability to manage health-related needs will improve Outcome: Progressing   Problem: Metabolic: Goal: Ability to maintain appropriate glucose levels will improve Outcome: Progressing   Problem: Nutritional: Goal: Maintenance of adequate nutrition will improve Outcome: Progressing Goal: Progress toward achieving an optimal weight will improve Outcome: Progressing   Problem: Skin Integrity: Goal: Risk for impaired skin integrity will decrease Outcome: Progressing   Problem: Tissue Perfusion: Goal: Adequacy of tissue perfusion will  improve Outcome: Progressing   Problem: Education: Goal: Knowledge of General Education information will improve Description: Including pain rating scale, medication(s)/side effects and non-pharmacologic comfort measures Outcome: Progressing   Problem: Health Behavior/Discharge Planning: Goal: Ability to manage health-related needs will improve Outcome: Progressing   Problem: Clinical Measurements: Goal: Ability to maintain clinical measurements within normal limits will improve Outcome: Progressing Goal: Will remain free from infection Outcome: Progressing Goal: Diagnostic test results will improve Outcome: Progressing Goal: Respiratory complications will improve Outcome: Progressing Goal: Cardiovascular complication will be avoided Outcome: Progressing   Problem: Activity: Goal: Risk for activity intolerance will decrease Outcome: Progressing   Problem: Nutrition: Goal: Adequate nutrition will be maintained Outcome: Progressing   Problem: Coping: Goal: Level of anxiety will decrease Outcome: Progressing   Problem: Elimination: Goal: Will not experience complications related to bowel motility Outcome: Progressing Goal: Will not experience complications related to urinary retention Outcome: Progressing   Problem: Pain Managment: Goal: General experience of comfort will improve Outcome: Progressing   Problem: Safety: Goal: Ability to remain free from injury will improve Outcome: Progressing   Problem: Skin Integrity: Goal: Risk for impaired skin integrity will decrease Outcome: Progressing

## 2022-10-03 NOTE — Progress Notes (Signed)
POD # 5 Ileus Improving increase po intake Ambulating and feeling better Some flatus and liquid BM VSS, low grade temp that improved    PE NAD Abd: soft mildly distended w/o peritonitis, wound w/o infection   A/P Doing well clinically  Will watch temp Labs in am May need CT if fever persists

## 2022-10-03 NOTE — Plan of Care (Signed)
  Problem: Clinical Measurements: Goal: Diagnostic test results will improve Outcome: Progressing Goal: Signs and symptoms of infection will decrease Outcome: Progressing   Problem: Education: Goal: Knowledge of condition and prescribed therapy will improve Outcome: Progressing   Problem: Nutritional: Goal: Maintenance of adequate nutrition will improve Outcome: Progressing Goal: Progress toward achieving an optimal weight will improve Outcome: Progressing   Problem: Clinical Measurements: Goal: Ability to maintain clinical measurements within normal limits will improve Outcome: Progressing Goal: Will remain free from infection Outcome: Progressing Goal: Diagnostic test results will improve Outcome: Progressing Goal: Respiratory complications will improve Outcome: Progressing Goal: Cardiovascular complication will be avoided Outcome: Progressing   Problem: Activity: Goal: Risk for activity intolerance will decrease Outcome: Progressing

## 2022-10-04 DIAGNOSIS — I10 Essential (primary) hypertension: Secondary | ICD-10-CM | POA: Diagnosis not present

## 2022-10-04 DIAGNOSIS — K6389 Other specified diseases of intestine: Secondary | ICD-10-CM | POA: Diagnosis not present

## 2022-10-04 DIAGNOSIS — E1122 Type 2 diabetes mellitus with diabetic chronic kidney disease: Secondary | ICD-10-CM | POA: Diagnosis not present

## 2022-10-04 DIAGNOSIS — N1831 Chronic kidney disease, stage 3a: Secondary | ICD-10-CM | POA: Diagnosis not present

## 2022-10-04 LAB — COMPREHENSIVE METABOLIC PANEL
ALT: 11 U/L (ref 0–44)
AST: 14 U/L — ABNORMAL LOW (ref 15–41)
Albumin: 2.8 g/dL — ABNORMAL LOW (ref 3.5–5.0)
Alkaline Phosphatase: 64 U/L (ref 38–126)
Anion gap: 9 (ref 5–15)
BUN: 14 mg/dL (ref 8–23)
CO2: 19 mmol/L — ABNORMAL LOW (ref 22–32)
Calcium: 8.2 mg/dL — ABNORMAL LOW (ref 8.9–10.3)
Chloride: 108 mmol/L (ref 98–111)
Creatinine, Ser: 0.94 mg/dL (ref 0.61–1.24)
GFR, Estimated: >60 mL/min (ref >60)
Glucose, Bld: 118 mg/dL — ABNORMAL HIGH (ref 70–99)
Potassium: 3.4 mmol/L — ABNORMAL LOW (ref 3.5–5.1)
Sodium: 136 mmol/L (ref 135–145)
Total Bilirubin: 1.1 mg/dL (ref 0.3–1.2)
Total Protein: 5.2 g/dL — ABNORMAL LOW (ref 6.5–8.1)

## 2022-10-04 LAB — GLUCOSE, CAPILLARY
Glucose-Capillary: 103 mg/dL — ABNORMAL HIGH (ref 70–99)
Glucose-Capillary: 206 mg/dL — ABNORMAL HIGH (ref 70–99)
Glucose-Capillary: 125 mg/dL — ABNORMAL HIGH (ref 70–99)
Glucose-Capillary: 131 mg/dL — ABNORMAL HIGH (ref 70–99)

## 2022-10-04 LAB — CBC
HCT: 29.3 % — ABNORMAL LOW (ref 39.0–52.0)
Hemoglobin: 9.2 g/dL — ABNORMAL LOW (ref 13.0–17.0)
MCH: 28.8 pg (ref 26.0–34.0)
MCHC: 31.4 g/dL (ref 30.0–36.0)
MCV: 91.8 fL (ref 80.0–100.0)
Platelets: 173 10*3/uL (ref 150–400)
RBC: 3.19 MIL/uL — ABNORMAL LOW (ref 4.22–5.81)
RDW: 21.5 % — ABNORMAL HIGH (ref 11.5–15.5)
WBC: 10.1 10*3/uL (ref 4.0–10.5)
nRBC: 0 % (ref 0.0–0.2)

## 2022-10-04 MED ORDER — POTASSIUM CHLORIDE CRYS ER 20 MEQ PO TBCR
20.0000 meq | EXTENDED_RELEASE_TABLET | Freq: Two times a day (BID) | ORAL | Status: DC
  Administered 2022-10-04 – 2022-10-06 (×5): 20 meq via ORAL
  Filled 2022-10-04 (×5): qty 1

## 2022-10-04 NOTE — Progress Notes (Signed)
[facility] SURGICAL PROGRESS NOTE  Hospital Day(s): 6.   Post op day(s): 6 Days Post-Op.   Interval History:  Patient seen and examined No acute events or new complaints overnight.  Last fever 09/08 at 0900 Patient reports he is feeling better; incisional soreness No longer feels as bloated No fever, chills, nausea, emesis  He is without leukocytosis; 10.1K Hgb to 9.2; stable Renal function normal; sCr - 0.94; UO - unmeasured Hypokalemia to 3.4 FLD; tolerating Passing flatus; had BM Working with therapies   Vital signs in last 24 hours: [min-max] current  Temp:  [98.5 F (36.9 C)-100.4 F (38 C)] 99.7 F (37.6 C) (09/09 0346) Pulse Rate:  [73-85] 81 (09/09 0346) Resp:  [18-20] 20 (09/09 0346) BP: (138-161)/(72-85) 161/72 (09/09 0346) SpO2:  [97 %-99 %] 98 % (09/09 0346)     Height: 6\' 1"  (185.4 cm) Weight: 98.9 kg BMI (Calculated): 28.77   Intake/Output last 2 shifts:  09/08 0701 - 09/09 0700 In: 720 [P.O.:720] Out: 0    Physical Exam:  Constitutional: alert, cooperative and no distress  Respiratory: breathing non-labored at rest  Cardiovascular: regular rate and sinus rhythm  Gastrointestinal: soft, expected incisional soreness, distension improving, no rebound.guarding  Integumentary: Laparoscopic incisions are CDI with dermabond, no erythema. Did have ooze from umbilical incision, stopped this AM.   Labs:     Latest Ref Rng & Units 10/04/2022    6:00 AM 10/02/2022    7:07 AM 09/30/2022    5:18 AM  CBC  WBC 4.0 - 10.5 K/uL 10.1  9.5  9.0   Hemoglobin 13.0 - 17.0 g/dL 9.2  9.5  9.4   Hematocrit 39.0 - 52.0 % 29.3  31.1  29.4   Platelets 150 - 400 K/uL 173  151  130       Latest Ref Rng & Units 10/04/2022    6:00 AM 10/02/2022    7:07 AM 09/30/2022    5:18 AM  CMP  Glucose 70 - 99 mg/dL 191  99  478   BUN 8 - 23 mg/dL 14  12  15    Creatinine 0.61 - 1.24 mg/dL 2.95  6.21  3.08   Sodium 135 - 145 mmol/L 136  137  134   Potassium 3.5 - 5.1 mmol/L  3.4  3.8  3.8   Chloride 98 - 111 mmol/L 108  109  103   CO2 22 - 32 mmol/L 19  22  24    Calcium 8.9 - 10.3 mg/dL 8.2  8.2  7.7   Total Protein 6.5 - 8.1 g/dL 5.2     Total Bilirubin 0.3 - 1.2 mg/dL 1.1     Alkaline Phos 38 - 126 U/L 64     AST 15 - 41 U/L 14     ALT 0 - 44 U/L 11        Imaging studies: No new pertinent imaging studies   Assessment/Plan:  77 y.o. male with clinically resolving ileus 6 Days Post-Op s/p robotic assisted laparoscopic right hemicolectomy and ventral hernia repair for right colon mass   - FLD for now; Okay for soft diet this afternoon if does well   - Monitor abdominal examination; on-going bowel function - Pain control prn; antiemetics prn - Replete K+ - Mobilize; PT following    - Discharge Planning: Slowly improving. Potentially home in 24-48 hours  All of the above findings and recommendations were discussed with the patient, and the medical team, and all of patient's questions were  answered to his expressed satisfaction.  -- Lynden Oxford, PA-C [facility] 10/04/2022, 7:29 AM M-F: 7am - 4pm

## 2022-10-04 NOTE — Plan of Care (Signed)
  Problem: Fluid Volume: Goal: Hemodynamic stability will improve Outcome: Progressing   Problem: Clinical Measurements: Goal: Diagnostic test results will improve Outcome: Progressing Goal: Signs and symptoms of infection will decrease Outcome: Progressing   Problem: Respiratory: Goal: Ability to maintain adequate ventilation will improve Outcome: Progressing   Problem: Education: Goal: Knowledge of condition and prescribed therapy will improve Outcome: Progressing   Problem: Cardiac: Goal: Will achieve and/or maintain adequate cardiac output Outcome: Progressing   Problem: Physical Regulation: Goal: Complications related to the disease process, condition or treatment will be avoided or minimized Outcome: Progressing   Problem: Education: Goal: Ability to describe self-care measures that may prevent or decrease complications (Diabetes Survival Skills Education) will improve Outcome: Progressing Goal: Individualized Educational Video(s) Outcome: Progressing   Problem: Coping: Goal: Ability to adjust to condition or change in health will improve Outcome: Progressing   Problem: Fluid Volume: Goal: Ability to maintain a balanced intake and output will improve Outcome: Progressing   Problem: Health Behavior/Discharge Planning: Goal: Ability to identify and utilize available resources and services will improve Outcome: Progressing Goal: Ability to manage health-related needs will improve Outcome: Progressing   Problem: Metabolic: Goal: Ability to maintain appropriate glucose levels will improve Outcome: Progressing   Problem: Nutritional: Goal: Maintenance of adequate nutrition will improve Outcome: Progressing Goal: Progress toward achieving an optimal weight will improve Outcome: Progressing   Problem: Skin Integrity: Goal: Risk for impaired skin integrity will decrease Outcome: Progressing   Problem: Tissue Perfusion: Goal: Adequacy of tissue perfusion will  improve Outcome: Progressing   Problem: Education: Goal: Knowledge of General Education information will improve Description: Including pain rating scale, medication(s)/side effects and non-pharmacologic comfort measures Outcome: Progressing   Problem: Health Behavior/Discharge Planning: Goal: Ability to manage health-related needs will improve Outcome: Progressing   Problem: Clinical Measurements: Goal: Ability to maintain clinical measurements within normal limits will improve Outcome: Progressing Goal: Will remain free from infection Outcome: Progressing Goal: Diagnostic test results will improve Outcome: Progressing Goal: Respiratory complications will improve Outcome: Progressing Goal: Cardiovascular complication will be avoided Outcome: Progressing   Problem: Activity: Goal: Risk for activity intolerance will decrease Outcome: Progressing   Problem: Nutrition: Goal: Adequate nutrition will be maintained Outcome: Progressing   Problem: Coping: Goal: Level of anxiety will decrease Outcome: Progressing   Problem: Elimination: Goal: Will not experience complications related to bowel motility Outcome: Progressing Goal: Will not experience complications related to urinary retention Outcome: Progressing   Problem: Pain Managment: Goal: General experience of comfort will improve Outcome: Progressing   Problem: Safety: Goal: Ability to remain free from injury will improve Outcome: Progressing   Problem: Skin Integrity: Goal: Risk for impaired skin integrity will decrease Outcome: Progressing

## 2022-10-04 NOTE — Progress Notes (Addendum)
Physical Therapy Treatment Patient Details Name: Paul Briggs. MRN: [id number] DOB: [birth year] Today's Date: 10/04/2022   History of Present Illness Patient is a 77 year old male with history of CL leukemia and chronic anemia and most recently hospitalized for a soft tissue infection of the upper left thigh, had I&D which showed improvements, but is not completely healed. PMH includes leukemia, anemia, DM, CKD. Pt is now s/p Laparoscopic right colectomy and RFNA for ventral hernia repair for right colon mass.    PT Comments  Pt presents laying in bed, no complaints of pain. He continues to be modI for bed mobility and transfers. Pt performed DGI with CGA during session revealing mild deficits in vertical head turns, pivot turn, and stepping around obstacles. Pt also notes his balance is not at baseline, but feels his activity tolerance and general mobility are close to baseline. He would benefit from continued skilled therapy to address deficits in dynamic balance and maximize functional independence.   Pt also educated on importance of regular mobiltiy during session. PT assessment/plan section updated as appropriate and care team updated.   If plan is discharge home, recommend the following: A little help with walking and/or transfers;A little help with bathing/dressing/bathroom;Assistance with cooking/housework;Assist for transportation   Can travel by private vehicle        Equipment Recommendations  None recommended by PT    Recommendations for Other Services       Precautions / Restrictions Precautions Precautions: Fall Restrictions Weight Bearing Restrictions: No     Mobility  Bed Mobility Overal bed mobility: Modified Independent                  Transfers Overall transfer level: Modified independent                      Ambulation/Gait Ambulation/Gait assistance: Contact guard assist Gait Distance (Feet): 400 Feet Assistive device:  None Gait Pattern/deviations: WFL(Within Functional Limits), Step-through pattern Gait velocity: WNL     General Gait Details: maintained upright and no LOB noted throughout   Stairs             Wheelchair Mobility     Tilt Bed    Modified Rankin (Stroke Patients Only)       Balance Overall balance assessment: Modified Independent                               Standardized Balance Assessment Standardized Balance Assessment : Dynamic Gait Index   Dynamic Gait Index Level Surface: Normal Change in Gait Speed: Normal Gait with Horizontal Head Turns: Normal Gait with Vertical Head Turns: Mild Impairment Gait and Pivot Turn: Mild Impairment Step Over Obstacle: Normal Step Around Obstacles: Mild Impairment Steps: Mild Impairment Total Score: 20      Cognition Arousal: Alert Behavior During Therapy: WFL for tasks assessed/performed Overall Cognitive Status: Within Functional Limits for tasks assessed                                          Exercises      General Comments        Pertinent Vitals/Pain Pain Assessment Pain Assessment: No/denies pain    Home Living  Prior Function            PT Goals (current goals can now be found in the care plan section) Acute Rehab PT Goals Patient Stated Goal: walk better Progress towards PT goals: Progressing toward goals    Frequency    Min 1X/week      PT Plan      Co-evaluation              AM-PAC PT "6 Clicks" Mobility   Outcome Measure  Help needed turning from your back to your side while in a flat bed without using bedrails?: None Help needed moving from lying on your back to sitting on the side of a flat bed without using bedrails?: None Help needed moving to and from a bed to a chair (including a wheelchair)?: None Help needed standing up from a chair using your arms (e.g., wheelchair or bedside chair)?: None Help  needed to walk in hospital room?: A Little Help needed climbing 3-5 steps with a railing? : A Little 6 Click Score: 22    End of Session Equipment Utilized During Treatment: Gait belt Activity Tolerance: Patient tolerated treatment well Patient left: in chair;with call bell/phone within reach (RN notified/aware chair alarm is off)   PT Visit Diagnosis: Other abnormalities of gait and mobility (R26.89);Difficulty in walking, not elsewhere classified (R26.2)     Time: 4098-1191 PT Time Calculation (min) (ACUTE ONLY): 16 min  Charges:    $Therapeutic Activity: 8-22 mins PT General Charges $$ ACUTE PT VISIT: 1 Visit                    Armas Mcbee, PT, SPT 11:00 AM,10/04/22

## 2022-10-04 NOTE — Progress Notes (Signed)
Consultation Progress Note   Patient: Paul Briggs ZOX:[id number] DOB: [birth year] DOA: 09/28/2022 DOS: the patient was seen and examined on 10/04/2022 Primary service: Leafy Ro, MD  Brief hospital course: [name], [name] is a 77 year old male with history of hypertension, hypothyroid, non-insulin-dependent diabetes mellitus, hyperlipidemia, who was admitted under general surgery service for hand-assisted laparoscopic right hemicolectomy with stapled ileocolostomy on 09/28/2022.  Hospitalist service was consulted for medical management of hypertension and diabetes.  Assessment and Plan: * Mass of colon Patient is status post hand assisted laparoscopic right colectomy on 09/28/2022 with stapled ileocolostomy Management as per primary team  Essential hypertension BP better now. Continue Toprol XL, Entresto. Continue Hydralazine dose increased to 50mg  TID from BID. Continue Hydralazine 10 mg IV every 6 hours prn for SBP greater 175  Chronic, continuous use of opioids Chronic pain syndrome PDMP reviewed  CKD (chronic kidney disease) stage 3, GFR 30-59 ml/min (HCC) Creatinine stable. Continue to monitor  Type 2 diabetes mellitus- Blood sugars Improved. Continue accucheks, sliding scale insulin. Stopped novolog 4 units with meals due to hypoglycemia. Hypoglycemia protocol. Home meds - Farxiga, Metformin can be resumed at DC.     [facility] will continue to follow the patient.  Subjective: Patient is seen and examined today morning. He is sitting in chair, eating fair, passing gas. Abdominal pain, nausea better. Low grade temp noted yesterday.  Physical Exam: Vitals:   10/03/22 1737 10/03/22 1910 10/04/22 0346 10/04/22 0830  BP: 138/76 (!) 153/78 (!) 161/72 127/73  Pulse: 80 73 81 76  Resp: 18 20 20 18   Temp: 98.6 F (37 C) 98.5 F (36.9 C) 99.7 F (37.6 C) 98.6 F (37 C)  TempSrc: Oral Oral Oral   SpO2: 97% 99% 98% 99%  Weight:      Height:        General - Elderly African-American male, distress due to nausea HEENT - PERRLA, EOMI, atraumatic head, non tender sinuses. Lung - Clear, rales, rhonchi, wheezes. Heart - S1, S2 heard, no murmurs, rubs, trace pedal edema. Abdomen soft, nontender, surgical incisions noted. Neuro - Alert, awake and oriented x 3, non focal exam. Skin - Warm and dry. Data Reviewed:     Latest Ref Rng & Units 10/04/2022    6:00 AM 10/02/2022    7:07 AM 09/30/2022    5:18 AM  CBC  WBC 4.0 - 10.5 K/uL 10.1  9.5  9.0   Hemoglobin 13.0 - 17.0 g/dL 9.2  9.5  9.4   Hematocrit 39.0 - 52.0 % 29.3  31.1  29.4   Platelets 150 - 400 K/uL 173  151  130       Latest Ref Rng & Units 10/04/2022    6:00 AM 10/02/2022    7:07 AM 09/30/2022    5:18 AM  BMP  Glucose 70 - 99 mg/dL 811  99  914   BUN 8 - 23 mg/dL 14  12  15    Creatinine 0.61 - 1.24 mg/dL 7.82  9.56  2.13   Sodium 135 - 145 mmol/L 136  137  134   Potassium 3.5 - 5.1 mmol/L 3.4  3.8  3.8   Chloride 98 - 111 mmol/L 108  109  103   CO2 22 - 32 mmol/L 19  22  24    Calcium 8.9 - 10.3 mg/dL 8.2  8.2  7.7      Family Communication: Patient understands current care plan, further communication per primary team.  Time spent: 42 minutes.  Author: Marcelino Duster, MD 10/04/2022 3:36 PM  For on call review [email or web address].

## 2022-10-04 NOTE — Care Management Important Message (Signed)
Important Message  Patient Details  Name: Paul Edler [name] MRN: [id number] Date of Birth: [birth year]   Medicare Important Message Given:  Yes     [name] 10/04/2022, 11:14 AM

## 2022-10-04 NOTE — Progress Notes (Signed)
Mobility Specialist - Progress Note   10/04/22 1024  Mobility  Activity Ambulated with assistance in hallway  Level of Assistance Standby assist, set-up cues, supervision of patient - no hands on  Assistive Device None  Distance Ambulated (ft) 145 ft  Activity Response Tolerated well  $Mobility charge 1 Mobility  Mobility Specialist Start Time (ACUTE ONLY) 1020  Mobility Specialist Stop Time (ACUTE ONLY) 1024  Mobility Specialist Time Calculation (min) (ACUTE ONLY) 4 min   Pt sitting in the recliner upon entry, utilizing RA. Pt amb one lap around the NS with supervision, tolerated well. Pt returned to the room, left seated in the recliner with needs within reach.  Zetta Bills Mobility Specialist 10/04/22 10:26 AM

## 2022-10-05 DIAGNOSIS — N1831 Chronic kidney disease, stage 3a: Secondary | ICD-10-CM | POA: Diagnosis not present

## 2022-10-05 DIAGNOSIS — I10 Essential (primary) hypertension: Secondary | ICD-10-CM | POA: Diagnosis not present

## 2022-10-05 DIAGNOSIS — K6389 Other specified diseases of intestine: Secondary | ICD-10-CM | POA: Diagnosis not present

## 2022-10-05 DIAGNOSIS — E1122 Type 2 diabetes mellitus with diabetic chronic kidney disease: Secondary | ICD-10-CM | POA: Diagnosis not present

## 2022-10-05 LAB — BASIC METABOLIC PANEL
Anion gap: 4 — ABNORMAL LOW (ref 5–15)
BUN: 17 mg/dL (ref 8–23)
CO2: 21 mmol/L — ABNORMAL LOW (ref 22–32)
Calcium: 8.2 mg/dL — ABNORMAL LOW (ref 8.9–10.3)
Chloride: 111 mmol/L (ref 98–111)
Creatinine, Ser: 0.99 mg/dL (ref 0.61–1.24)
GFR, Estimated: >60 mL/min (ref >60)
Glucose, Bld: 124 mg/dL — ABNORMAL HIGH (ref 70–99)
Potassium: 3.5 mmol/L (ref 3.5–5.1)
Sodium: 136 mmol/L (ref 135–145)

## 2022-10-05 LAB — GLUCOSE, CAPILLARY
Glucose-Capillary: 103 mg/dL — ABNORMAL HIGH (ref 70–99)
Glucose-Capillary: 151 mg/dL — ABNORMAL HIGH (ref 70–99)
Glucose-Capillary: 95 mg/dL (ref 70–99)
Glucose-Capillary: 106 mg/dL — ABNORMAL HIGH (ref 70–99)

## 2022-10-05 MED ORDER — ENSURE ENLIVE PO LIQD: 237.0000 mL | Freq: Three times a day (TID) | ORAL | Status: DC

## 2022-10-05 NOTE — Progress Notes (Signed)
Consultation Progress Note   Patient: Paul Briggs ZOX:[id number] DOB: 1945/11/25 DOA: 09/28/2022 DOS: the patient was seen and examined on 10/05/2022 Primary service: Leafy Ro, MD  Brief hospital course: Mr. [name], [name] is a 77 year old male with history of hypertension, hypothyroid, non-insulin-dependent diabetes mellitus, hyperlipidemia, who was admitted under general surgery service for hand-assisted laparoscopic right hemicolectomy with stapled ileocolostomy on 09/28/2022.  Hospitalist service was consulted for medical management of hypertension and diabetes.  Assessment and Plan: * Mass of colon Patient is status post hand assisted laparoscopic right colectomy on 09/28/2022 with stapled ileocolostomy Management as per primary team  Essential hypertension BP better now. Continue Toprol XL, Entresto. Change Hydralazine to 50mg  TID from BID upon discharge. Continue Hydralazine 10 mg IV every 6 hours prn for SBP greater 175  Chronic, continuous use of opioids Chronic pain syndrome PDMP reviewed  CKD (chronic kidney disease) stage 3, GFR 30-59 ml/min (HCC) Creatinine stable. Continue to monitor  Type 2 diabetes mellitus- Blood sugars stable. Continue accucheks, sliding scale insulin. Hypoglycemia protocol. Home meds - Farxiga, Metformin can be resumed at DC.     [facility] will sign off at present, please call us again when needed.  Subjective: Patient is seen and examined today morning. He is sitting in chair, eating fair, passing gas, did not have a bowel movement since last 2 days. No abdominal pain, nausea.  Physical Exam: Vitals:   10/04/22 1943 10/05/22 0254 10/05/22 0323 10/05/22 0902  BP: 130/72 (!) 102/46 (!) 147/70 133/87  Pulse: 76 82  79  Resp: 18 18  18   Temp: 98.6 F (37 C) 99 F (37.2 C)  98.2 F (36.8 C)  TempSrc: Oral Oral    SpO2: 100% 98%  99%  Weight:      Height:       General - Elderly African-American male, distress due to  nausea HEENT - PERRLA, EOMI, atraumatic head, non tender sinuses. Lung - Clear, rales, rhonchi, wheezes. Heart - S1, S2 heard, no murmurs, rubs, trace pedal edema. Abdomen soft, nontender, surgical incisions noted. Neuro - Alert, awake and oriented x 3, non focal exam. Skin - Warm and dry. Data Reviewed:     Latest Ref Rng & Units 10/04/2022    6:00 AM 10/02/2022    7:07 AM 09/30/2022    5:18 AM  CBC  WBC 4.0 - 10.5 K/uL 10.1  9.5  9.0   Hemoglobin 13.0 - 17.0 g/dL 9.2  9.5  9.4   Hematocrit 39.0 - 52.0 % 29.3  31.1  29.4   Platelets 150 - 400 K/uL 173  151  130       Latest Ref Rng & Units 10/05/2022    4:57 AM 10/04/2022    6:00 AM 10/02/2022    7:07 AM  BMP  Glucose 70 - 99 mg/dL 811  914  99   BUN 8 - 23 mg/dL 17  14  12    Creatinine 0.61 - 1.24 mg/dL 7.82  9.56  2.13   Sodium 135 - 145 mmol/L 136  136  137   Potassium 3.5 - 5.1 mmol/L 3.5  3.4  3.8   Chloride 98 - 111 mmol/L 111  108  109   CO2 22 - 32 mmol/L 21  19  22    Calcium 8.9 - 10.3 mg/dL 8.2  8.2  8.2      Family Communication: Patient understands current care plan, further communication per primary team.  Time spent: 42 minutes.  Author:  Marcelino Duster, MD 10/05/2022 3:42 PM  For on call review [email or web address].

## 2022-10-05 NOTE — Progress Notes (Signed)
Mobility Specialist - Progress Note   10/05/22 0944  Mobility  Activity Ambulated with assistance in hallway;Ambulated with assistance to bathroom;Ambulated with assistance in room  Level of Assistance Standby assist, set-up cues, supervision of patient - no hands on  Assistive Device Front wheel walker  Distance Ambulated (ft) 145 ft  Activity Response Tolerated well  $Mobility charge 1 Mobility  Mobility Specialist Start Time (ACUTE ONLY) [id number]  Mobility Specialist Stop Time (ACUTE ONLY) 0925  Mobility Specialist Time Calculation (min) (ACUTE ONLY) 9 min   Pt supine upon entry, utilizing RA. Pt completed bed mob indep and STS from EOB ModI. Pt amb to the bathroom and one lap around the NS MinG-SBA, constantly holding onto the railing. During amb Pt expressed mild dizziness, stating " it's mostly when I get up". Pt returned to the room, left seated EOB with needs within reach.  Zetta Bills Mobility Specialist 10/05/22 9:49 AM

## 2022-10-05 NOTE — Progress Notes (Signed)
[facility] SURGICAL PROGRESS NOTE  Hospital Day(s): 7.   Post op day(s): 7 Days Post-Op.   Interval History:  Patient seen and examined No acute events or new complaints overnight.  No further fevers Patient reports incisional soreness; worse at umbilicus No fever, chills, nausea, emesis  Renal function normal; sCr - 0.99; UO - unmeasured Potassium normalized; 3.5 Soft diet; tolerating Passing flatus; no BM Working with therapies   Vital signs in last 24 hours: [min-max] current  Temp:  [98.6 F (37 C)-99 F (37.2 C)] 99 F (37.2 C) (09/10 0254) Pulse Rate:  [76-82] 82 (09/10 0254) Resp:  [18] 18 (09/10 0254) BP: (102-147)/(46-73) 147/70 (09/10 0323) SpO2:  [98 %-100 %] 98 % (09/10 0254)     Height: 6\' 1"  (185.4 cm) Weight: 98.9 kg BMI (Calculated): 28.77   Intake/Output last 2 shifts:  09/09 0701 - 09/10 0700 In: 180 [P.O.:180] Out: -    Physical Exam:  Constitutional: alert, cooperative and no distress  Respiratory: breathing non-labored at rest  Cardiovascular: regular rate and sinus rhythm  Gastrointestinal: soft, expected incisional soreness worse at umbilical incision, distension improving, no rebound/guarding  Integumentary: Laparoscopic incisions are CDI with dermabond, no erythema.   Labs:     Latest Ref Rng & Units 10/04/2022    6:00 AM 10/02/2022    7:07 AM 09/30/2022    5:18 AM  CBC  WBC 4.0 - 10.5 K/uL 10.1  9.5  9.0   Hemoglobin 13.0 - 17.0 g/dL 9.2  9.5  9.4   Hematocrit 39.0 - 52.0 % 29.3  31.1  29.4   Platelets 150 - 400 K/uL 173  151  130       Latest Ref Rng & Units 10/05/2022    4:57 AM 10/04/2022    6:00 AM 10/02/2022    7:07 AM  CMP  Glucose 70 - 99 mg/dL 782  956  99   BUN 8 - 23 mg/dL 17  14  12    Creatinine 0.61 - 1.24 mg/dL 2.13  0.86  5.78   Sodium 135 - 145 mmol/L 136  136  137   Potassium 3.5 - 5.1 mmol/L 3.5  3.4  3.8   Chloride 98 - 111 mmol/L 111  108  109   CO2 22 - 32 mmol/L 21  19  22    Calcium 8.9 - 10.3  mg/dL 8.2  8.2  8.2   Total Protein 6.5 - 8.1 g/dL  5.2    Total Bilirubin 0.3 - 1.2 mg/dL  1.1    Alkaline Phos 38 - 126 U/L  64    AST 15 - 41 U/L  14    ALT 0 - 44 U/L  11       Imaging studies: No new pertinent imaging studies   Assessment/Plan:  77 y.o. male with clinically resolving ileus 7 Days Post-Op s/p robotic assisted laparoscopic right hemicolectomy and ventral hernia repair for right colon mass   - Continue soft diet   - Monitor abdominal examination; on-going bowel function - Pain control prn; antiemetics prn - Mobilize; PT following  - Appreciate medicine assistance with HTN   - Discharge Planning: Making improvements; Will plan for discharge home tomorrow (09/11) as long as he continues to do well today.   All of the above findings and recommendations were discussed with the patient, and the medical team, and all of patient's questions were answered to his expressed satisfaction.  -- Lynden Oxford, PA-C [facility] 10/05/2022, 7:48  AM M-F: 7am - 4pm

## 2022-10-05 NOTE — Plan of Care (Signed)
  Problem: Fluid Volume: Goal: Hemodynamic stability will improve Outcome: Progressing   Problem: Clinical Measurements: Goal: Diagnostic test results will improve Outcome: Progressing Goal: Signs and symptoms of infection will decrease Outcome: Progressing   Problem: Respiratory: Goal: Ability to maintain adequate ventilation will improve Outcome: Progressing   Problem: Education: Goal: Knowledge of condition and prescribed therapy will improve Outcome: Progressing   Problem: Cardiac: Goal: Will achieve and/or maintain adequate cardiac output Outcome: Progressing   Problem: Physical Regulation: Goal: Complications related to the disease process, condition or treatment will be avoided or minimized Outcome: Progressing   Problem: Education: Goal: Ability to describe self-care measures that may prevent or decrease complications (Diabetes Survival Skills Education) will improve Outcome: Progressing Goal: Individualized Educational Video(s) Outcome: Progressing   Problem: Coping: Goal: Ability to adjust to condition or change in health will improve Outcome: Progressing   Problem: Fluid Volume: Goal: Ability to maintain a balanced intake and output will improve Outcome: Progressing   Problem: Health Behavior/Discharge Planning: Goal: Ability to identify and utilize available resources and services will improve Outcome: Progressing Goal: Ability to manage health-related needs will improve Outcome: Progressing   Problem: Metabolic: Goal: Ability to maintain appropriate glucose levels will improve Outcome: Progressing   Problem: Nutritional: Goal: Maintenance of adequate nutrition will improve Outcome: Progressing Goal: Progress toward achieving an optimal weight will improve Outcome: Progressing   Problem: Skin Integrity: Goal: Risk for impaired skin integrity will decrease Outcome: Progressing   Problem: Tissue Perfusion: Goal: Adequacy of tissue perfusion will  improve Outcome: Progressing   Problem: Education: Goal: Knowledge of General Education information will improve Description: Including pain rating scale, medication(s)/side effects and non-pharmacologic comfort measures Outcome: Progressing   Problem: Health Behavior/Discharge Planning: Goal: Ability to manage health-related needs will improve Outcome: Progressing   Problem: Clinical Measurements: Goal: Ability to maintain clinical measurements within normal limits will improve Outcome: Progressing Goal: Will remain free from infection Outcome: Progressing Goal: Diagnostic test results will improve Outcome: Progressing Goal: Respiratory complications will improve Outcome: Progressing Goal: Cardiovascular complication will be avoided Outcome: Progressing   Problem: Activity: Goal: Risk for activity intolerance will decrease Outcome: Progressing   Problem: Nutrition: Goal: Adequate nutrition will be maintained Outcome: Progressing   Problem: Coping: Goal: Level of anxiety will decrease Outcome: Progressing   Problem: Elimination: Goal: Will not experience complications related to bowel motility Outcome: Progressing Goal: Will not experience complications related to urinary retention Outcome: Progressing   Problem: Pain Managment: Goal: General experience of comfort will improve Outcome: Progressing   Problem: Safety: Goal: Ability to remain free from injury will improve Outcome: Progressing   Problem: Skin Integrity: Goal: Risk for impaired skin integrity will decrease Outcome: Progressing

## 2022-10-05 NOTE — Progress Notes (Signed)
Nutrition Follow-up  DOCUMENTATION CODES:   Not applicable  INTERVENTION:   Ensure Enlive po TID, each supplement provides 350 kcal and 20 grams of protein.  MVI po daily   Pt at high refeed risk; recommend monitor potassium, magnesium and phosphorus labs daily until stable  Daily weights   NUTRITION DIAGNOSIS:   Increased nutrient needs related to post-op healing as evidenced by estimated needs.  GOAL:   Patient will meet greater than or equal to 90% of their needs -progressing   MONITOR:   PO intake, Supplement acceptance, Labs, Weight trends, I & O's, Skin  ASSESSMENT:   77 y/o male with h/o HTN, hypothyroidism, CKD III, DDD, chronic pain, CLL, IDA, BPH, DM, OSA and SDH who is admitted with colon mass now s/p laparoscopic right hemicolectomy  With stapled ileocolostomy and repair of incarcerated umbilical hernia 9/3 complicated by post op ileus.  Pt initiated on a soft diet yesterday. Pt with good appetite and oral intake; pt eating 100% of meals in hospital. RD will add Ensure supplements. Pt is at high refeed risk. No new weight since admission; will request daily weights. Plan is for possible discharge tomorrow.   Medications reviewed and include: lovenox, insulin, synthroid, MVI, protonix, KCl  Labs reviewed: K 3.5 wnl P 2.0(L), Mg 2.0 wnl- 9/7 Hgb 9.2(L), Hct 29.3(L)- 9/9 Cbgs- 151, 103 x 24 hrs   Diet Order:   Diet Order             DIET SOFT Room service appropriate? Yes; Fluid consistency: Thin  Diet effective now                  EDUCATION NEEDS:   No education needs have been identified at this time  Skin:  Skin Assessment: Skin Integrity Issues: Skin Integrity Issues:: Other (Comment) Incisions: closed abdomen Other: non-pressure wound to lt thigh  Last BM:  9/8  Height:   Ht Readings from Last 1 Encounters:  09/28/22 6\' 1"  (1.854 m)    Weight:   Wt Readings from Last 1 Encounters:  09/28/22 98.9 kg    Ideal Body Weight:  83.6  kg  BMI:  Body mass index is 28.77 kg/m.  Estimated Nutritional Needs:   Kcal:  2300-2600kcal/day  Protein:  115-130g/day  Fluid:  2.2-2.5L/day  Betsey Holiday MS, RD, LDN Please refer to [facility] for RD and/or RD on-call/weekend/after hours pager

## 2022-10-06 ENCOUNTER — Other Ambulatory Visit: Payer: Self-pay | Admitting: *Deleted

## 2022-10-06 DIAGNOSIS — K6389 Other specified diseases of intestine: Secondary | ICD-10-CM

## 2022-10-06 LAB — BASIC METABOLIC PANEL
Anion gap: 4 — ABNORMAL LOW (ref 5–15)
BUN: 15 mg/dL (ref 8–23)
CO2: 22 mmol/L (ref 22–32)
Calcium: 8.3 mg/dL — ABNORMAL LOW (ref 8.9–10.3)
Chloride: 113 mmol/L — ABNORMAL HIGH (ref 98–111)
Creatinine, Ser: 0.87 mg/dL (ref 0.61–1.24)
GFR, Estimated: >60 mL/min (ref >60)
Glucose, Bld: 103 mg/dL — ABNORMAL HIGH (ref 70–99)
Potassium: 4.2 mmol/L (ref 3.5–5.1)
Sodium: 139 mmol/L (ref 135–145)

## 2022-10-06 LAB — GLUCOSE, CAPILLARY
Glucose-Capillary: 110 mg/dL — ABNORMAL HIGH (ref 70–99)
Glucose-Capillary: 96 mg/dL (ref 70–99)

## 2022-10-06 LAB — PHOSPHORUS: Phosphorus: 2.7 mg/dL (ref 2.5–4.6)

## 2022-10-06 LAB — MAGNESIUM: Magnesium: 2.1 mg/dL (ref 1.7–2.4)

## 2022-10-06 MED ORDER — METHOCARBAMOL 500 MG PO TABS: 500.0000 mg | ORAL_TABLET | Freq: Three times a day (TID) | ORAL | 0 refills | Status: DC | PRN

## 2022-10-06 MED ORDER — ONDANSETRON 4 MG PO TBDP: 4.0000 mg | ORAL_TABLET | Freq: Four times a day (QID) | ORAL | 0 refills | Status: DC | PRN

## 2022-10-06 MED ORDER — PREGABALIN 50 MG PO CAPS: 50.0000 mg | ORAL_CAPSULE | Freq: Three times a day (TID) | ORAL | 0 refills | Status: DC

## 2022-10-06 MED ORDER — AMOXICILLIN-POT CLAVULANATE 875-125 MG PO TABS: 1.0000 | ORAL_TABLET | Freq: Two times a day (BID) | ORAL | 0 refills | Status: AC

## 2022-10-06 NOTE — Consult Note (Signed)
[facility] Customer service manager [facility]) Accountable Care Organization (ACO) [facility] Liaison Note  10/06/2022  Paul Bieker [name] [birth year] [id number]  Location: [facility] RN Hospital Liaison met patient at bedside at [facility].  Insurance: [employer]   Paul Arpino [name] is a 77 y.o. male who is a Primary Care Patient of [place] [place], PA-C. The patient was screened for 30 day readmission hospitalization with noted extreme risk score for unplanned readmission risk with 3 IP in 6 months.  The patient was assessed for potential [facility]) Care Management service needs for post hospital transition for care coordination. Review of patient's electronic medical record reveals patient was admitted for  amass of colon. Liaison attempted contact prior to his discharged from the hospital today however pt departed prior to the visit. Based upon the risk for readmission will make a referral for nurse care coordinator to follow up with post hospital readmission prevention measures.  Plan: [facility] Liaison will continue to follow progress and disposition to asess for post hospital community care coordination/management needs.  Referral request for community care coordination: Will make a referral or a nurse care coordinator.   The [facility] Care Management/Population Health does not replace or interfere with any arrangements made by the Inpatient Transition of Care team.   For questions contact:   Elliot Cousin, RN, [facility] Liaison [place]   Population Health Office Hours MTWF  8:00 am-6:00 pm [phone] mobile [phone] [Office toll free line] Office Hours are M-F 8:30 - 5 pm [email or web address]

## 2022-10-06 NOTE — Progress Notes (Signed)
Patient previously declined HH, CSW spoke with patient again before discharge today to offer outpatient PT. Patient declines at this time, reports he has a ride home today and no needs.   TOC signing off.    Darolyn Rua, [place], MSW, [place] [phone]

## 2022-10-06 NOTE — Progress Notes (Signed)
Mobility Specialist - Progress Note   10/06/22 0909  Mobility  Activity Ambulated with assistance in hallway  Level of Assistance Standby assist, set-up cues, supervision of patient - no hands on  Assistive Device None  Distance Ambulated (ft) 145 ft  Activity Response Tolerated well  $Mobility charge 1 Mobility  Mobility Specialist Start Time (ACUTE ONLY) 0900  Mobility Specialist Stop Time (ACUTE ONLY) 0907  Mobility Specialist Time Calculation (min) (ACUTE ONLY) 7 min   Zetta Bills Mobility Specialist 10/06/22 9:10 AM

## 2022-10-06 NOTE — Plan of Care (Signed)
  Problem: Clinical Measurements: Goal: Diagnostic test results will improve Outcome: Progressing   Problem: Respiratory: Goal: Ability to maintain adequate ventilation will improve Outcome: Progressing   Problem: Education: Goal: Knowledge of condition and prescribed therapy will improve Outcome: Progressing   Problem: Coping: Goal: Ability to adjust to condition or change in health will improve Outcome: Progressing   Problem: Metabolic: Goal: Ability to maintain appropriate glucose levels will improve Outcome: Progressing   Problem: Education: Goal: Knowledge of General Education information will improve Description: Including pain rating scale, medication(s)/side effects and non-pharmacologic comfort measures Outcome: Progressing   Problem: Health Behavior/Discharge Planning: Goal: Ability to manage health-related needs will improve Outcome: Progressing

## 2022-10-06 NOTE — Discharge Instructions (Addendum)
In addition to included general post-operative instructions for ,  Diet: Resume home diet.   Activity: No heavy lifting >20 pounds (children, pets, laundry, garbage) or strenuous activity for 6 weeks, but light activity and walking are encouraged. Do not drive or drink alcohol if taking narcotic pain medications or having pain that might distract from driving.  Wound care: Cover umbilical wound with dry gauze (or large Band-Aid) daily. Okay to remove to shower. You may shower/get incision wet with soapy water and pat dry (do not rub incisions), but no baths or submerging incision underwater until follow-up.   Medications: Resume all home medications. For mild to moderate pain: acetaminophen (Tylenol) or ibuprofen/naproxen (if no kidney disease). Combining Tylenol with alcohol can substantially increase your risk of causing liver disease. Narcotic pain medications, if prescribed, can be used for severe pain, though may cause nausea, constipation, and drowsiness. Do not combine Tylenol and Percocet (or similar) within a 6 hour period as Percocet (and similar) contain(s) Tylenol. If you do not need the narcotic pain medication, you do not need to fill the prescription.  Call office [phone] / [phone]) at any time if any questions, worsening pain, fevers/chills, bleeding, drainage from incision site, or other concerns.

## 2022-10-06 NOTE — Plan of Care (Signed)
IV removed, discharge instructions reviewed and patient discharged to home

## 2022-10-06 NOTE — Discharge Summary (Signed)
[facility] SURGICAL DISCHARGE SUMMARY  Patient ID: Paul Blaker [name] MRN: [id number] DOB/AGE: [birth year] 77 y.o.  Admit date: 09/28/2022 Discharge date: 10/06/2022  Discharge Diagnoses Patient Active Problem List   Diagnosis Date Noted   Mass of colon 09/28/2022   Colonic mass 09/03/2022    Consultants Medicine  Procedures 09/28/2022 Hand assisted laparoscopic right hemicolectomy Repair incarcerated ventral hernia  HPI: Matvei Grass [name] is a 77 y.o. 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   Hospital Course: Informed consent was obtained and documented, and patient underwent uneventful robotic assisted laparoscopic right hemicolectomy and repair of incarcerated ventral hernia (Dr Everlene Farrier, 09/28/2022).  Post-operatively, patient did reasonably well. He did develop ileus but did not require NGT nor TPN. Medicine was consulted for HTN. Otherwise, advancement of patient's diet and ambulation were well-tolerated. The remainder of patient's hospital course was essentially unremarkable, and discharge planning was initiated accordingly with patient safely able to be discharged home with appropriate discharge instructions, pain control, and outpatient follow-up after all of his questions were answered to his expressed satisfaction.  I will send him on a short course of Augmentin BID x7 days as a precaution to cover any potential umbilical wound infection.   He has standing prescription for 10-325 mg Percocet which he can continue. I will add 14 days of Lyrica, methocarbamol, and Zofran as needed for home. Encouraged to utilize OTC modalities, ice packs as well.    Discharge Condition: Good   Physical Examination:  Constitutional: alert, cooperative and no distress  Respiratory: breathing non-labored at rest  Cardiovascular: regular rate and sinus  rhythm  Gastrointestinal: soft, expected incisional soreness worse at umbilical incision, distension improving, no rebound/guarding  Integumentary: At umbilical incision there is a scant amount of thicker serosanguinous drainage from a small opening centrally; this is not overtly purulent. I made extensive attempts at manually expressing any fluid which only revealed a very scant amount. This is not any significant erythema, swelling, or warmth to this area. I do not think there are any undrained collections. All other laparoscopic incisions are CDI with dermabond, no erythema.    Allergies as of 10/06/2022   No Known Allergies      Medication List     STOP taking these medications    bisacodyl 5 MG EC tablet Commonly known as: DULCOLAX   metroNIDAZOLE 500 MG tablet Commonly known as: Flagyl   neomycin 500 MG tablet Commonly known as: MYCIFRADIN   polyethylene glycol powder 17 GM/SCOOP powder Commonly known as: MiraLax       TAKE these medications    amoxicillin-clavulanate 875-125 MG tablet Commonly known as: AUGMENTIN Take 1 tablet by mouth 2 (two) times daily for 7 days.   aspirin EC 81 MG tablet Take 1 tablet (81 mg total) by mouth daily. Swallow whole.   dapagliflozin propanediol 10 MG Tabs tablet Commonly known as: Farxiga Take 1 tablet (10 mg total) by mouth daily before breakfast.   Entresto 24-26 MG Generic drug: sacubitril-valsartan Take 1 tablet by mouth 2 (two) times daily.   hydrALAZINE 50 MG tablet Commonly known as: APRESOLINE Take 50 mg by mouth 2 (two) times daily.   levothyroxine 200 MCG tablet Commonly known as: SYNTHROID Take 200 mcg by mouth daily before breakfast.   metFORMIN 500 MG tablet Commonly known as: GLUCOPHAGE Take 500 mg by mouth 2 (two) times daily with a meal.   methocarbamol 500  MG tablet Commonly known as: ROBAXIN Take 1 tablet (500 mg total) by mouth every 8 (eight) hours as needed for muscle spasms.   metoprolol  succinate 25 MG 24 hr tablet Commonly known as: Toprol XL Take 1 tablet (25 mg total) by mouth daily. What changed: when to take this   ondansetron 4 MG disintegrating tablet Commonly known as: ZOFRAN-ODT Take 1 tablet (4 mg total) by mouth every 6 (six) hours as needed for nausea.   oxyCODONE-acetaminophen 10-325 MG tablet Commonly known as: PERCOCET Take 1 tablet by mouth every 8 (eight) hours as needed. Start taking on: October 22, 2022 What changed: Another medication with the same name was removed. Continue taking this medication, and follow the directions you see here.   pregabalin 50 MG capsule Commonly known as: LYRICA Take 1 capsule (50 mg total) by mouth 3 (three) times daily for 14 days.   rosuvastatin 40 MG tablet Commonly known as: CRESTOR Take 1 tablet (40 mg total) by mouth daily. What changed: when to take this   tamsulosin 0.4 MG Caps capsule Commonly known as: FLOMAX Take 0.4 mg by mouth daily.               Discharge Care Instructions  (From admission, onward)           Start     Ordered   10/06/22 0000  Discharge wound care:       Comments: Cover umbilical wound with dry gauze or superficial band-aid daily; okay to remove to shower; replace at completion   10/06/22 0813              Follow-up Information     Pabon, Merri Ray, MD. Go on 10/11/2022.   Specialty: General Surgery Why: Go to appointment on 09/16 at 145 PM Contact information: [address] [place] [address] [phone]                  Time spent on discharge management including discussion of hospital course, clinical condition, outpatient instructions, prescriptions, and follow up with the patient and members of the medical team: >30 minutes  -- Lynden Oxford , PA-C [facility]  10/06/2022, 8:15 AM [phone] M-F: 7am - 4pm

## 2022-10-07 ENCOUNTER — Telehealth: Payer: Self-pay | Admitting: *Deleted

## 2022-10-07 NOTE — Progress Notes (Signed)
  Care Coordination   Note   10/07/2022 Name: Sylys Sobieraj [name] MRN: [id number] DOB: 1945/07/24  [name] is a 77 y.o. year old male who sees Harlon Flor, Jonnie Finner, PA-C for primary care. I reached out to [name] by phone today to offer care coordination services.  Mr. Reicks was given information about Care Coordination services today including:   The Care Coordination services include support from the care team which includes your Nurse Coordinator, Clinical Social Worker, or Pharmacist.  The Care Coordination team is here to help remove barriers to the health concerns and goals most important to you. Care Coordination services are voluntary, and the patient may decline or stop services at any time by request to their care team member.   Care Coordination Consent Status: Patient agreed to services and verbal consent obtained.   Follow up plan:  Telephone appointment with care coordination team member scheduled for:  11/01/2022  Encounter Outcome:  Patient Scheduled from referral   Burman Nieves, [facility] Care Coordination Care Guide Direct Dial: [phone]

## 2022-10-08 DIAGNOSIS — C189 Malignant neoplasm of colon, unspecified: Secondary | ICD-10-CM | POA: Diagnosis not present

## 2022-10-08 DIAGNOSIS — Z008 Encounter for other general examination: Secondary | ICD-10-CM | POA: Diagnosis not present

## 2022-10-11 ENCOUNTER — Ambulatory Visit (INDEPENDENT_AMBULATORY_CARE_PROVIDER_SITE_OTHER): Payer: No Typology Code available for payment source | Admitting: Surgery

## 2022-10-11 ENCOUNTER — Encounter: Payer: Self-pay | Admitting: Surgery

## 2022-10-11 ENCOUNTER — Ambulatory Visit
Admission: RE | Admit: 2022-10-11 | Discharge: 2022-10-11 | Disposition: A | Discharge status: Home / Self Care | Payer: No Typology Code available for payment source | Source: Ambulatory Visit | Attending: Surgery | Admitting: Surgery

## 2022-10-11 VITALS — BP 175/78 | HR 75 | Temp 98.4°F | Ht 73.0 in | Wt 222.6 lb

## 2022-10-11 DIAGNOSIS — R1031 Right lower quadrant pain: Secondary | ICD-10-CM | POA: Diagnosis not present

## 2022-10-11 DIAGNOSIS — R188 Other ascites: Secondary | ICD-10-CM | POA: Diagnosis not present

## 2022-10-11 DIAGNOSIS — R109 Unspecified abdominal pain: Secondary | ICD-10-CM

## 2022-10-11 DIAGNOSIS — K6389 Other specified diseases of intestine: Secondary | ICD-10-CM

## 2022-10-11 DIAGNOSIS — Z09 Encounter for follow-up examination after completed treatment for conditions other than malignant neoplasm: Secondary | ICD-10-CM

## 2022-10-11 DIAGNOSIS — K5900 Constipation, unspecified: Secondary | ICD-10-CM | POA: Diagnosis not present

## 2022-10-11 DIAGNOSIS — K439 Ventral hernia without obstruction or gangrene: Secondary | ICD-10-CM | POA: Diagnosis not present

## 2022-10-11 MED ORDER — IOHEXOL 300 MG/ML  SOLN
100.0000 mL | Freq: Once | INTRAMUSCULAR | Status: AC | PRN
  Administered 2022-10-11: 100 mL via INTRAVENOUS

## 2022-10-11 NOTE — Patient Instructions (Addendum)
Your CT is scheduled for today at [facility] after visit.     Minimally Invasive Partial Colectomy, Adult, Care After  The following information offers guidance on how to care for yourself after your procedure. Your health care provider may also give you more specific instructions. If you have problems or questions, contact your health care provider. What can I expect after the surgery? After the procedure, it is common to have: Pain, bruising, and swelling. Bloating. Weakness and tiredness (fatigue). Changes to your bowel movements, especially having bowel movements more often. Follow these instructions at home: Medicines Take over-the-counter and prescription medicines only as told by your health care provider. If you were prescribed an antibiotic medicine, take it as told by your health care provider. Do not stop using the antibiotic even if you start to feel better. Ask your health care provider if the medicine prescribed to you: Requires you to avoid driving or using machinery. Can cause constipation. You may need to take these actions to prevent or treat constipation: Drink enough fluids to keep your urine pale yellow. Take over-the-counter or prescription medicines. Limit foods that are high in fat and processed sugars, such as fried or sweet foods. Eating and drinking Follow instructions from your health care provider about what you may eat and drink. Do not drink alcohol if your health care provider tells you not to drink. Eat a low-fiber diet for the first 4 weeks after surgery or as told by your health care provider.. Most people on a low-fiber eating plan should eat less than 10 grams (g) of fiber a day. Follow recommendations from your health care provider or dietitian about how much fiber you should have each day. Always check food labels to know the fiber content of packaged foods. In general, a low-fiber food will have fewer than 2 g of fiber per serving. In  general, try to avoid whole grains, raw fruits and vegetables, dried fruit, tough cuts of meat, nuts, and seeds. Incision care  Follow instructions from your health care provider about how to take care of your incisions. Make sure you: Wash your hands with soap and water for at least 20 seconds before and after you change your bandage (dressing). If soap and water are not available, use hand sanitizer. Change your dressing as told by your health care provider. Leave stitches (sutures), skin glue, or adhesive strips in place. These skin closures may need to stay in place for 2 weeks or longer. If adhesive strip edges start to loosen and curl up, you may trim the loose edges. Do not remove adhesive strips completely unless your health care provider tells you to do that. Check your incision area every day for signs of infection. Check for: More redness, swelling, or pain. Fluid or blood. Warmth. Pus or a bad smell. Activity Rest as told by your health care provider. Avoid sitting for a long time without moving. Get up to take short walks every 1-2 hours. This is important to improve blood flow and breathing. Ask for help if you feel weak or unsteady. You may have to avoid lifting. Ask your health care provider how much you can safely lift. Return to your normal activities as told by your health care provider. Ask your health care provider what activities are safe for you. General instructions Do not use any products that contain nicotine or tobacco. These products include cigarettes, chewing tobacco, and vaping devices, such as e-cigarettes. If you need help quitting, ask your health  care provider. Do not take baths, swim, or use a hot tub until your health care provider approves. Ask your health care provider if you may take showers. You may only be allowed to take sponge baths. Wear compression stockings as told by your health care provider. These stockings help to prevent blood clots and reduce  swelling in your legs. Keep all follow-up visits. This is important to monitor healing and check for any complications. Contact a health care provider if: Medicine is not controlling your pain. You have chills or fever. You have any signs of infection in your incision areas. You have a persistent cough. You have nausea or vomiting. You develop a rash. You have not had a bowel movement in 3 days. Get help right away if: You have severe pain. Your incisions break open after sutures or staples have been removed. You are bleeding from your rectum or have blood in your stool. You have a warm, tender swelling in your leg. You have chest pain or trouble breathing. You have increased swelling in the abdomen. You feel light-headed or you faint. These symptoms may be an emergency. Get help right away. Call 911. Do not wait to see if the symptoms will go away. Do not drive yourself to the hospital. Summary After surgery, it is common to have some pain, bruising, swelling, bloating, tiredness, weakness, or changes to your bowel movements. Follow instructions from your health care provider about what to eat and drink. Return to your normal activities as told by your health care provider. Check your incision area every day for signs of infection. Get help right away if you have chest pain or trouble breathing. This information is not intended to replace advice given to you by your health care provider. Make sure you discuss any questions you have with your health care provider. Document Revised: 04/29/2021 Document Reviewed: 04/29/2021 Elsevier Patient Education  2024 [employer].

## 2022-10-11 NOTE — Progress Notes (Signed)
Outpatient Surgical Follow Up  10/11/2022  Paul Ana [name] is an 77 y.o. male.   Chief Complaint  Patient presents with   Routine Post Op    Right colectomy 09/28/22    HPI: Paul Briggs is 2 weeks out from hand-assisted right colectomy.  Did well having bowel movements and flatus.  Some small drainage from the wound.  No fevers no chills.  He does have persistent abdominal pain and he reports that over the weekend got worse. Noted discussed with him in detail.  Centimeter polyp with high-grade dysplasia negative nodes for adenocarcinoma but nodes were positive for known  lymphoma  Past Medical History:  Diagnosis Date   Adenoma of left adrenal gland    Aortic atherosclerosis (HCC)    BPH (benign prostatic hyperplasia)    Bradycardia    CAD (coronary artery disease) 08/26/2022   a.) cCTA 08/26/2022: Ca2+ 14.3 (12th %'ile for age/sex.race match control); (<25%) pLAD   Cellulitis of left thigh 08/2022   CHF (congestive heart failure) (HCC)    a.) TTE 10/23/2015: EF 60-65%, mod-sev LVH, mild biatrial dil, degen MV disease, AoV sclerosis, asc Ao 38 mm, G1DD; b.) TTE 08/05/2022: EF 45-50%, mod-sev LVH (speckled pattern), sev biatrial dil, mild MR, AoV sclerosis, G1DD   Chronic pain syndrome    a.) on COT managed by pain management   Chronic, continuous use of opioids    a.) chronic pain syndrome/chronic back pain; managed by pain management   CKD (chronic kidney disease) stage 3, GFR 30-59 ml/min (HCC)    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   a.) Rai stage III   DDD (degenerative disc disease), cervical    Difficult airway 10/22/2015   a.) 1st attempt with glidescope  --> macroglossic (unsuccessful); b.) 2nd attempt with Mac 4 and direct laryngoscopy with a 8.0 ETT --> tube too large to pass through the cords; b.) 3rd attempt (successful) with a Mac 4 and a 7.5 tube   Diverticulosis    DM (diabetes mellitus), type 2 (HCC)    Dysplasia of prostate    Erectile  dysfunction    Frequent falls    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   History of MRSA infection 08/28/2022   a.) MRSA PCR (+) 08/28/2022; culture from LEFT thigh abscess   HTN (hypertension)    Hyperlipidemia    Hypogonadism in male    Hypothyroidism    IDA (iron deficiency anemia)    LBBB (left bundle branch block)    Long-term use of aspirin therapy    Lumbar spinal stenosis    Mild cardiomegaly    Nodule of right lung    Orchitis and epididymitis 06/16/2013   OSA on CPAP    Palindromic rheumatism, hand    Pericardial effusion    Prostatitis    Sepsis (HCC) 08/2022   Subdural hematoma (HCC) 10/22/2015   a.) s/p traumatic mechanical fall --> CT head 10/22/2015: high-density SDH the left cerebral convexity with13 mm midline shift --> s/p LEFT frontal burr hole craniotomy   Tubular adenoma of colon    Ventral hernia     Past Surgical History:  Procedure Laterality Date   BACK SURGERY     BIOPSY  08/21/2022   Procedure: BIOPSY;  Surgeon: Jaynie Collins, DO;  Location: [facility]  ENDOSCOPY;  Service: Gastroenterology;;   BIOPSY  08/23/2022   Procedure: BIOPSY;  Surgeon: Jaynie Collins, DO;  Location: [facility] ENDOSCOPY;  Service: Gastroenterology;;   Guss Bunde OF CRANIUM Left 10/22/2015   COLONOSCOPY WITH PROPOFOL N/A 08/23/2022   Procedure: COLONOSCOPY WITH PROPOFOL;  Surgeon: Jaynie Collins, DO;  Location: [facility] ENDOSCOPY;  Service: Gastroenterology;  Laterality: N/A;   ESOPHAGOGASTRODUODENOSCOPY (EGD) WITH PROPOFOL N/A 08/21/2022   Procedure: ESOPHAGOGASTRODUODENOSCOPY (EGD) WITH PROPOFOL;  Surgeon: Jaynie Collins, DO;  Location: [facility] ENDOSCOPY;  Service: Gastroenterology;  Laterality: N/A;   GIVENS CAPSULE STUDY N/A 08/23/2022    Procedure: GIVENS CAPSULE STUDY;  Surgeon: Jaynie Collins, DO;  Location: [facility] ENDOSCOPY;  Service: Gastroenterology;  Laterality: N/A;   IRRIGATION AND DEBRIDEMENT ABSCESS Left 08/28/2022   Procedure: IRRIGATION AND DEBRIDEMENT ABSCESS LEFT UPPER THIGH/GROIN;  Surgeon: Leafy Ro, MD;  Location: [facility] ORS;  Service: General;  Laterality: Left;   LAPAROSCOPIC RIGHT COLECTOMY Right 09/28/2022   Procedure: LAPAROSCOPIC RIGHT COLECTOMY, RNFA to assist;  Surgeon: Leafy Ro, MD;  Location: [facility] ORS;  Service: General;  Laterality: Right;   POLYPECTOMY  08/23/2022   Procedure: POLYPECTOMY;  Surgeon: Jaynie Collins, DO;  Location: [facility] ENDOSCOPY;  Service: Gastroenterology;;   SUBMUCOSAL TATTOO INJECTION  08/23/2022   Procedure: SUBMUCOSAL TATTOO INJECTION;  Surgeon: Jaynie Collins, DO;  Location: [facility] ENDOSCOPY;  Service: Gastroenterology;;   TONSILLECTOMY     VENTRAL HERNIA REPAIR N/A 09/28/2022   Procedure: HERNIA REPAIR VENTRAL ADULT;  Surgeon: Leafy Ro, MD;  Location: [facility] ORS;  Service: General;  Laterality: N/A;    Family History  Problem Relation Age of Onset   Cancer Maternal Aunt    Prostate cancer Neg Hx    Kidney disease Neg Hx    Kidney cancer Neg Hx    Bladder Cancer Neg Hx     Social History:  reports that he has quit smoking. His smoking use included cigarettes. He has been exposed to tobacco smoke. He has never used smokeless tobacco. He reports that he does not drink alcohol and does not use drugs.  Allergies: No Known Allergies  Medications reviewed.    ROS Full ROS performed and is otherwise negative other than what is stated in HPI   BP (!) 175/78   Pulse 75   Temp 98.4 F (36.9 C) (Oral)   Ht 6\' 1"  (1.854 m)   Wt 222 lb 9.6 oz (101 kg)   SpO2 98%   BMI 29.37 kg/m   Physical Exam NAD alert Abd: soft, dressing in place w serous output, no abscess or wound infection, no peritonitis  Assessment/Plan: 2 weeks out from Lap  RR hemicolectomy, doing ok but w persistent pain. Due to persistent pain we will obtain CT A/P to unsure no leaks or abscess are overlook.  He Is not toxic nor peritonitic and doesnot required immediate surgical intervention or hospitalization. RTC in 1 week.  Sterling Big, MD [facility] General Surgeon

## 2022-10-12 ENCOUNTER — Encounter: Payer: Self-pay | Admitting: Oncology

## 2022-10-12 ENCOUNTER — Telehealth: Payer: Self-pay

## 2022-10-12 NOTE — Telephone Encounter (Signed)
Notified patient as instructed, patient pleased. Discussed follow-up appointments, patient agrees

## 2022-10-12 NOTE — Telephone Encounter (Signed)
-----   Message from [place] sent at 10/12/2022  7:17 AM EDT ----- Please let him knwo CT scan look good ----- Message ----- From: Interface, Rad Results In Sent: 10/11/2022   5:47 PM EDT To: Leafy Ro, MD

## 2022-10-14 DIAGNOSIS — R69 Illness, unspecified: Secondary | ICD-10-CM | POA: Diagnosis not present

## 2022-10-17 NOTE — Progress Notes (Deleted)
Established patient visit  Patient: Paul Wallett [name]   DOB: 12-Aug-1945   77 y.o. Male  MRN: [id number] Visit Date: 10/19/2022  Today's healthcare provider: Debera Lat, PA-C   No chief complaint on file.  Subjective    HPI  *** Discussed the use of AI scribe software for clinical note transcription with the patient, who gave verbal consent to proceed.  History of Present Illness               08/19/2022   11:21 AM 06/16/2022   11:08 AM 02/25/2022   11:00 AM  Depression screen PHQ 2/9  Decreased Interest 0 0 0  Down, Depressed, Hopeless 0 0 0  PHQ - 2 Score 0 0 0       No data to display          Medications: Outpatient Medications Prior to Visit  Medication Sig   aspirin EC 81 MG tablet Take 1 tablet (81 mg total) by mouth daily. Swallow whole.   dapagliflozin propanediol (FARXIGA) 10 MG TABS tablet Take 1 tablet (10 mg total) by mouth daily before breakfast.   hydrALAZINE (APRESOLINE) 50 MG tablet Take 50 mg by mouth 2 (two) times daily.   levothyroxine (SYNTHROID) 200 MCG tablet Take 200 mcg by mouth daily before breakfast.   metFORMIN (GLUCOPHAGE) 500 MG tablet Take 500 mg by mouth 2 (two) times daily with a meal.   methocarbamol (ROBAXIN) 500 MG tablet Take 1 tablet (500 mg total) by mouth every 8 (eight) hours as needed for muscle spasms.   metoprolol succinate (TOPROL XL) 25 MG 24 hr tablet Take 1 tablet (25 mg total) by mouth daily. (Patient taking differently: Take 25 mg by mouth every morning.)   ondansetron (ZOFRAN-ODT) 4 MG disintegrating tablet Take 1 tablet (4 mg total) by mouth every 6 (six) hours as needed for nausea.   [START ON 10/22/2022] oxyCODONE-acetaminophen (PERCOCET) 10-325 MG tablet Take 1 tablet by mouth every 8 (eight) hours as needed.   pregabalin (LYRICA) 50 MG capsule Take 1 capsule (50 mg total) by mouth 3 (three) times daily for 14 days.   rosuvastatin (CRESTOR) 40 MG tablet Take 1 tablet (40 mg total) by mouth daily. (Patient  taking differently: Take 40 mg by mouth every morning.)   sacubitril-valsartan (ENTRESTO) 24-26 MG Take 1 tablet by mouth 2 (two) times daily.   tamsulosin (FLOMAX) 0.4 MG CAPS capsule Take 0.4 mg by mouth daily.   No facility-administered medications prior to visit.    Review of Systems  All other systems reviewed and are negative.  Except see HPI   {Insert previous labs (optional):23779} {See past labs  Heme  Chem  Endocrine  Serology  Results Review (optional):1}   Objective    There were no vitals taken for this visit. {Insert last BP/Wt (optional):23777}{See vitals history (optional):1}   Physical Exam Vitals reviewed.  Constitutional:      General: He is not in acute distress.    Appearance: Normal appearance. He is not diaphoretic.  HENT:     Head: Normocephalic and atraumatic.  Eyes:     General: No scleral icterus.    Conjunctiva/sclera: Conjunctivae normal.  Cardiovascular:     Rate and Rhythm: Normal rate and regular rhythm.     Pulses: Normal pulses.     Heart sounds: Normal heart sounds. No murmur heard. Pulmonary:     Effort: Pulmonary effort is normal. No respiratory distress.     Breath sounds: Normal breath sounds. No wheezing  or rhonchi.  Musculoskeletal:     Cervical back: Neck supple.     Right lower leg: No edema.     Left lower leg: No edema.  Lymphadenopathy:     Cervical: No cervical adenopathy.  Skin:    General: Skin is warm and dry.     Findings: No rash.  Neurological:     Mental Status: He is alert and oriented to person, place, and time. Mental status is at baseline.  Psychiatric:        Mood and Affect: Mood normal.        Behavior: Behavior normal.      No results found for any visits on 10/19/22.  Assessment & Plan      No follow-ups on file.     The patient was advised to call back or seek an in-person evaluation if the symptoms worsen or if the condition fails to improve as anticipated.  I discussed the assessment  and treatment plan with the patient. The patient was provided an opportunity to ask questions and all were answered. The patient agreed with the plan and demonstrated an understanding of the instructions.  I, Debera Lat, PA-C have reviewed all documentation for this visit. The documentation on  10/18/22  for the exam, diagnosis, procedures, and orders are all accurate and complete.  Debera Lat, [facility], MMS [facility] [phone] (phone) [phone] (fax)  [facility]

## 2022-10-18 ENCOUNTER — Inpatient Hospital Stay: Payer: No Typology Code available for payment source

## 2022-10-18 ENCOUNTER — Other Ambulatory Visit (HOSPITAL_COMMUNITY): Payer: Self-pay

## 2022-10-18 ENCOUNTER — Inpatient Hospital Stay: Payer: No Typology Code available for payment source | Attending: Oncology

## 2022-10-18 ENCOUNTER — Ambulatory Visit (INDEPENDENT_AMBULATORY_CARE_PROVIDER_SITE_OTHER): Payer: No Typology Code available for payment source | Admitting: Surgery

## 2022-10-18 ENCOUNTER — Encounter: Payer: Self-pay | Admitting: Surgery

## 2022-10-18 ENCOUNTER — Encounter: Payer: Self-pay | Admitting: Oncology

## 2022-10-18 ENCOUNTER — Inpatient Hospital Stay (HOSPITAL_BASED_OUTPATIENT_CLINIC_OR_DEPARTMENT_OTHER): Payer: No Typology Code available for payment source | Admitting: Oncology

## 2022-10-18 VITALS — BP 176/75 | HR 59 | Temp 97.7°F | Ht 73.0 in | Wt 215.2 lb

## 2022-10-18 VITALS — BP 140/90 | HR 75 | Temp 96.0°F | Resp 18 | Wt 216.1 lb

## 2022-10-18 VITALS — BP 138/78 | HR 57 | Resp 18

## 2022-10-18 DIAGNOSIS — N1831 Chronic kidney disease, stage 3a: Secondary | ICD-10-CM | POA: Diagnosis not present

## 2022-10-18 DIAGNOSIS — D509 Iron deficiency anemia, unspecified: Secondary | ICD-10-CM | POA: Diagnosis not present

## 2022-10-18 DIAGNOSIS — C9110 Chronic lymphocytic leukemia of B-cell type not having achieved remission: Secondary | ICD-10-CM | POA: Diagnosis not present

## 2022-10-18 DIAGNOSIS — D649 Anemia, unspecified: Secondary | ICD-10-CM

## 2022-10-18 DIAGNOSIS — N183 Chronic kidney disease, stage 3 unspecified: Secondary | ICD-10-CM | POA: Insufficient documentation

## 2022-10-18 DIAGNOSIS — D126 Benign neoplasm of colon, unspecified: Secondary | ICD-10-CM | POA: Diagnosis not present

## 2022-10-18 DIAGNOSIS — K6389 Other specified diseases of intestine: Secondary | ICD-10-CM

## 2022-10-18 DIAGNOSIS — Z09 Encounter for follow-up examination after completed treatment for conditions other than malignant neoplasm: Secondary | ICD-10-CM

## 2022-10-18 LAB — CMP (CANCER CENTER ONLY)
ALT: 14 U/L (ref 0–44)
AST: 29 U/L (ref 15–41)
Albumin: 3.6 g/dL (ref 3.5–5.0)
Alkaline Phosphatase: 81 U/L (ref 38–126)
Anion gap: 11 (ref 5–15)
BUN: 14 mg/dL (ref 8–23)
CO2: 24 mmol/L (ref 22–32)
Calcium: 8.9 mg/dL (ref 8.9–10.3)
Chloride: 108 mmol/L (ref 98–111)
Creatinine: 1.19 mg/dL (ref 0.61–1.24)
GFR, Estimated: >60 mL/min (ref >60)
Glucose, Bld: 103 mg/dL — ABNORMAL HIGH (ref 70–99)
Potassium: 4.1 mmol/L (ref 3.5–5.1)
Sodium: 143 mmol/L (ref 135–145)
Total Bilirubin: 0.7 mg/dL (ref 0.3–1.2)
Total Protein: 6.6 g/dL (ref 6.5–8.1)

## 2022-10-18 LAB — IRON AND TIBC
Iron: 37 ug/dL — ABNORMAL LOW (ref 45–182)
Saturation Ratios: 14 % — ABNORMAL LOW (ref 17.9–39.5)
TIBC: 265 ug/dL (ref 250–450)
UIBC: 228 ug/dL

## 2022-10-18 LAB — CBC WITH DIFFERENTIAL (CANCER CENTER ONLY)
Abs Immature Granulocytes: 0.03 10*3/uL (ref 0.00–0.07)
Basophils Absolute: 0.1 10*3/uL (ref 0.0–0.1)
Basophils Relative: 1 %
Eosinophils Absolute: 0.2 10*3/uL (ref 0.0–0.5)
Eosinophils Relative: 3 %
HCT: 28.1 % — ABNORMAL LOW (ref 39.0–52.0)
Hemoglobin: 8.6 g/dL — ABNORMAL LOW (ref 13.0–17.0)
Immature Granulocytes: 0 %
Lymphocytes Relative: 41 %
Lymphs Abs: 3.1 10*3/uL (ref 0.7–4.0)
MCH: 28.7 pg (ref 26.0–34.0)
MCHC: 30.6 g/dL (ref 30.0–36.0)
MCV: 93.7 fL (ref 80.0–100.0)
Monocytes Absolute: 1 10*3/uL (ref 0.1–1.0)
Monocytes Relative: 14 %
Neutro Abs: 3.1 10*3/uL (ref 1.7–7.7)
Neutrophils Relative %: 41 %
Platelet Count: 360 10*3/uL (ref 150–400)
RBC: 3 MIL/uL — ABNORMAL LOW (ref 4.22–5.81)
RDW: 21.2 % — ABNORMAL HIGH (ref 11.5–15.5)
Smear Review: NORMAL
WBC Count: 7.5 10*3/uL (ref 4.0–10.5)
nRBC: 0 % (ref 0.0–0.2)

## 2022-10-18 LAB — FERRITIN: Ferritin: 78 ng/mL (ref 24–336)

## 2022-10-18 LAB — RETIC PANEL
Immature Retic Fract: 21.9 % — ABNORMAL HIGH (ref 2.3–15.9)
RBC.: 3.01 MIL/uL — ABNORMAL LOW (ref 4.22–5.81)
Retic Count, Absolute: 59.3 10*3/uL (ref 19.0–186.0)
Retic Ct Pct: 2 % (ref 0.4–3.1)
Reticulocyte Hemoglobin: 28.7 pg (ref >27.9)

## 2022-10-18 MED ORDER — SODIUM CHLORIDE 0.9 % IV SOLN
200.0000 mg | Freq: Once | INTRAVENOUS | Status: AC
  Administered 2022-10-18: 200 mg via INTRAVENOUS
  Filled 2022-10-18: qty 200

## 2022-10-18 MED ORDER — SODIUM CHLORIDE 0.9 % IV SOLN
INTRAVENOUS | Status: DC
  Filled 2022-10-18: qty 250

## 2022-10-18 MED ORDER — VITAMIN B-12 1000 MCG PO TABS: 1000.0000 ug | ORAL_TABLET | Freq: Every day | ORAL | 0 refills | Status: DC

## 2022-10-18 NOTE — Assessment & Plan Note (Signed)
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 Biopsy showed tubular adenoma, negative for malignancy Status post right hemicolectomy. Pathology showed tubular adenoma with focal high-grade dysplasia 4.1 cm.  20 lymph nodes were involved by patient's known CLL.  Negative for metastatic carcinoma.  Negative for invasive carcinoma.

## 2022-10-18 NOTE — Progress Notes (Signed)
Pt has been educated and understands. Pt declined to stay 30 mins after iron infusion. VSS.

## 2022-10-18 NOTE — Assessment & Plan Note (Signed)
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 Lab Results  Component Value Date   HGB 8.6 (L) 10/18/2022   TIBC 265 10/18/2022   IRONPCTSAT 14 (L) 10/18/2022   FERRITIN 78 10/18/2022    Hemoglobin remains low  Recommend Venofer weekly x 4 to further improve iron store.

## 2022-10-18 NOTE — Assessment & Plan Note (Signed)
Labs are reviewed and discussed with patient. CT chest abdomen pelvis results reviewed and discussed with patient.   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 Hold Zanubrutinib 160 mg twice daily due to anemia.

## 2022-10-18 NOTE — Patient Instructions (Addendum)
Follow up here in 3 months.   Minimally Invasive Partial Colectomy, Adult, Care After The following information offers guidance on how to care for yourself after your procedure. Your health care provider may also give you more specific instructions. If you have problems or questions, contact your health care provider. What can I expect after the surgery? After the procedure, it is common to have: Pain, bruising, and swelling. Bloating. Weakness and tiredness (fatigue). Changes to your bowel movements, especially having bowel movements more often. Follow these instructions at home: Medicines Take over-the-counter and prescription medicines only as told by your health care provider. If you were prescribed an antibiotic medicine, take it as told by your health care provider. Do not stop using the antibiotic even if you start to feel better. Ask your health care provider if the medicine prescribed to you: Requires you to avoid driving or using machinery. Can cause constipation. You may need to take these actions to prevent or treat constipation: Drink enough fluids to keep your urine pale yellow. Take over-the-counter or prescription medicines. Limit foods that are high in fat and processed sugars, such as fried or sweet foods. Eating and drinking Follow instructions from your health care provider about what you may eat and drink. Do not drink alcohol if your health care provider tells you not to drink. Eat a low-fiber diet for the first 4 weeks after surgery or as told by your health care provider.. Most people on a low-fiber eating plan should eat less than 10 grams (g) of fiber a day. Follow recommendations from your health care provider or dietitian about how much fiber you should have each day. Always check food labels to know the fiber content of packaged foods. In general, a low-fiber food will have fewer than 2 g of fiber per serving. In general, try to avoid whole grains, raw fruits  and vegetables, dried fruit, tough cuts of meat, nuts, and seeds. Incision care  Follow instructions from your health care provider about how to take care of your incisions. Make sure you: Wash your hands with soap and water for at least 20 seconds before and after you change your bandage (dressing). If soap and water are not available, use hand sanitizer. Change your dressing as told by your health care provider. Leave stitches (sutures), skin glue, or adhesive strips in place. These skin closures may need to stay in place for 2 weeks or longer. If adhesive strip edges start to loosen and curl up, you may trim the loose edges. Do not remove adhesive strips completely unless your health care provider tells you to do that. Check your incision area every day for signs of infection. Check for: More redness, swelling, or pain. Fluid or blood. Warmth. Pus or a bad smell. Activity Rest as told by your health care provider. Avoid sitting for a long time without moving. Get up to take short walks every 1-2 hours. This is important to improve blood flow and breathing. Ask for help if you feel weak or unsteady. You may have to avoid lifting. Ask your health care provider how much you can safely lift. Return to your normal activities as told by your health care provider. Ask your health care provider what activities are safe for you. General instructions Do not use any products that contain nicotine or tobacco. These products include cigarettes, chewing tobacco, and vaping devices, such as e-cigarettes. If you need help quitting, ask your health care provider. Do not take baths, swim, or use  a hot tub until your health care provider approves. Ask your health care provider if you may take showers. You may only be allowed to take sponge baths. Wear compression stockings as told by your health care provider. These stockings help to prevent blood clots and reduce swelling in your legs. Keep all follow-up  visits. This is important to monitor healing and check for any complications. Contact a health care provider if: Medicine is not controlling your pain. You have chills or fever. You have any signs of infection in your incision areas. You have a persistent cough. You have nausea or vomiting. You develop a rash. You have not had a bowel movement in 3 days. Get help right away if: You have severe pain. Your incisions break open after sutures or staples have been removed. You are bleeding from your rectum or have blood in your stool. You have a warm, tender swelling in your leg. You have chest pain or trouble breathing. You have increased swelling in the abdomen. You feel light-headed or you faint. These symptoms may be an emergency. Get help right away. Call 911. Do not wait to see if the symptoms will go away. Do not drive yourself to the hospital. Summary After surgery, it is common to have some pain, bruising, swelling, bloating, tiredness, weakness, or changes to your bowel movements. Follow instructions from your health care provider about what to eat and drink. Return to your normal activities as told by your health care provider. Check your incision area every day for signs of infection. Get help right away if you have chest pain or trouble breathing. This information is not intended to replace advice given to you by your health care provider. Make sure you discuss any questions you have with your health care provider. Document Revised: 04/29/2021 Document Reviewed: 04/29/2021 Elsevier Patient Education  2024 [employer].

## 2022-10-18 NOTE — Assessment & Plan Note (Signed)
Encourage oral hydration and avoid nephrotoxins.   

## 2022-10-18 NOTE — Progress Notes (Signed)
Hematology/Oncology Progress note [phone] [phone]        ASSESSMENT & PLAN:   Cancer Staging  CLL (chronic lymphocytic leukemia) (HCC) Staging form: Chronic Lymphocytic Leukemia / Small Lymphocytic Lymphoma, AJCC 8th Edition - Clinical stage from 12/28/2021: Modified Rai Stage III (Modified Rai risk: High, Lymphocytosis: Present, Adenopathy: Present, Organomegaly: Absent, Anemia: Present, Thrombocytopenia: Absent) - Signed by [name], MD on 01/20/2022   CLL (chronic lymphocytic leukemia) [facility]) Labs are reviewed and discussed with patient. CT chest abdomen pelvis results reviewed and discussed with patient.   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 Hold Zanubrutinib 160 mg twice daily due to anemia.     CKD (chronic kidney disease) stage 3, GFR 30-59 ml/min (HCC) Encourage oral hydration and avoid nephrotoxins.     IDA (iron deficiency anemia) Anemia is likely multifactorial, could be due to CLL, anemia due to chronic kidney disease and IDA. Lab Results  Component Value Date   HGB 8.6 (L) 10/18/2022   TIBC 265 10/18/2022   IRONPCTSAT 14 (L) 10/18/2022   FERRITIN 78 10/18/2022    Hemoglobin remains low  Recommend Venofer weekly x 4 to further improve iron store.   Tubular adenoma of colon Colonoscopy showed Likely malignant partially obstructing tumor in the distal ascending colon. Biopsy showed tubular adenoma, negative for malignancy Status post right hemicolectomy. Pathology showed tubular adenoma with focal high-grade dysplasia 4.1 cm.  20 lymph nodes were involved by patient's known CLL.  Negative for metastatic carcinoma.  Negative for invasive carcinoma.  Orders Placed This Encounter  Procedures   CMP (Cancer Center only)    Standing Status:   Future    Standing Expiration Date:   10/18/2023   CBC with Differential (Cancer Center Only)    Standing  Status:   Future    Standing Expiration Date:   10/18/2023   Iron and TIBC    Standing Status:   Future    Standing Expiration Date:   10/18/2023   Ferritin    Standing Status:   Future    Standing Expiration Date:   10/18/2023   Retic Panel    Standing Status:   Future    Standing Expiration Date:   10/18/2023   Vitamin B12    Standing Status:   Future    Standing Expiration Date:   10/18/2023   Folate    Standing Status:   Future    Standing Expiration Date:   10/18/2023   Follow up 4-5 weeks All questions were answered. The patient knows to call the clinic with any problems, questions or concerns.  [name], MD, PhD [facility] Hematology Oncology 10/18/2022    CHIEF COMPLAINTS/PURPOSE OF CONSULTATION:  CLL  HISTORY OF PRESENTING ILLNESS:  Paul Ana [name] 77 y.o. male presents for follow up of CLL I have reviewed his chart and materials related to his cancer extensively and collaborated history with the patient. Summary of oncologic history is as follows: Oncology History  CLL (chronic lymphocytic leukemia) (HCC)  12/25/2021 - 12/26/2021 Hospital Admission   Patient was admitted due to pain and swelling of right ankle, right knee and left wrist.  Patient was found to have WBC 30.4, with neutrophil 29% and lymphocyte 2%.  Peripheral smear showed leukocytosis with slight left shift in myeloid series and lymphocytosis with abnormal morphology.  Uric acid 5.5, worsening renal function with creatinine 1.35, BUN 18, GFR 55 (baseline creatinine 1.08 on  07/06/2021), lactic acid 1.1.  ESR normal, uric acid 5.5 normal, CRP 22.2, LDH 182  Orthopedic surgery was consulted s/p arthrocentesis, cell count 296 with 72% neutrophil, no crystal. Fluid negative for growth.  Patient received cefepime and vancomycin while in the hospital, Patient was discharged on Keflex for 5 days.   12/28/2021 Cancer Staging   Staging form: Chronic Lymphocytic Leukemia / Small Lymphocytic Lymphoma, AJCC 8th  Edition - Clinical stage from 12/28/2021: Modified Rai Stage III (Modified Rai risk: High, Lymphocytosis: Present, Adenopathy: Present, Organomegaly: Absent, Anemia: Present, Thrombocytopenia: Absent) - Signed by [name], MD on 01/20/2022 Stage prefix: Initial diagnosis Hemoglobin (Hgb) (g/dL): 35.5   73/02/2023 Initial Diagnosis   CLL (chronic lymphocytic leukemia)   12/27/21 peripheral blood flowcytometry showed Involvement by CD5+, CD23+, CD20+, CD22+ clonal B cell population, phenotype typical for 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    01/05/2022 Imaging   CT chest abdomen pelvis wo contrast 1. Multiple prominent borderline enlarged and mildly enlarged lymph nodes, most evident in the low anatomic pelvis, as above, compatible with reported clinical history of CLL. 2. There also several small pulmonary nodules in the lungs measuring 5 mm or less in size. This is nonspecific, but statistically likely benign. No follow-up needed if patient is low-risk (and has no known or suspected primary neoplasm). Non-contrast chest CT can be considered in 12 months if patient is high-risk. This recommendation 3. Aortic atherosclerosis, in addition to left main and 2 vessel coronary artery disease. Please note that although the presence of coronary artery calcium documents the presence of coronary artery disease, the severity of this disease and any potential stenosis cannot be assessed on this non-gated CT examination. Assessment for  potential risk factor modification, dietary therapy or pharmacologic therapy may be warranted, if clinically indicated. 4. There are calcifications of the aortic valve. Echocardiographic correlation for evaluation of potential valvular dysfunction may be warranted if clinically indicated. 5. Small left adrenal  adenoma, similar to prior studies. 6. Diverticulosis without evidence of acute diverticulitis at this time. 7. Mild cardiomegaly.   08/26/2022 Imaging   CT abdomen pelvis w contrast  1. Subcutaneous fat stranding within the left lower quadrant anterior abdominal wall extending into the anterior proximal left thigh, likely related to contusion given history of recent trauma. No fluid collection or hematoma. 2. No acute displaced fracture. 3. Retroperitoneal and pelvic lymphadenopathy as above, with waxing and waning appearance. 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 4. 1.9 cm hypodensity within the medial aspect of the spleen,nonspecific. Leukemic involvement of the spleen cannot be excluded. 5. Distal colonic diverticulosis without diverticulitis. 6.  Aortic Atherosclerosis      INTERVAL HISTORY Paul Markoski [name] is a 77 y.o. male who has above history reviewed by me today presents for follow up visit for CLL. Off Zanubrutinib 160 mg twice daily + dark stool, no abdominal pain + fatigue 08/23/2022 Colonoscopy showed - Likely malignant partially obstructing tumor in the distal ascending colon. Biopsied. Tattooed. - Three 4 to 6 mm polyps in the transverse colon and in the ascending colon, removed with a cold snare. Resected and retrieved. - Five 3 to 7 mm polyps in the rectum, in the descending colon, in the transverse colon and in the ascending colon, removed with a hot snare. Resected and retrieved. 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 - Non- bleeding internal hemorrhoids.  Pathology showed 1. Ascending Colon Polyp, x2 cold snare - TUBULAR ADENOMA(S) (MULTIPLE FRAGMENTS) - NEGATIVE FOR HIGH-GRADE DYSPLASIA OR MALIGNANCY 2. Ascending Colon Polyp, hot snare - TUBULAR ADENOMA (MULTIPLE FRAGMENTS) - NEGATIVE FOR HIGH-GRADE DYSPLASIA OR MALIGNANCY 3. Ascending Colon Biopsy, mass cbx - TUBULAR ADENOMA (MULTIPLE FRAGMENTS) - SEE NOTE 4. Transverse Colon Polyp, hot snare;  cold snare - TUBULAR ADENOMA (MULTIPLE FRAGMENTS) - NEGATIVE FOR HIGH-GRADE DYSPLASIA OR MALIGNANCY 5. Descending Colon Polyp, x2 hot snare - TUBULAR ADENOMA(S) (MULTIPLE FRAGMENTS) - NEGATIVE FOR HIGH-GRADE DYSPLASIA OR MALIGNANCY 6. Rectum, polyp(s), hot snare - TUBULOVILLOUS ADENOMA (MULTIPLE FRAGMENTS) - NEGATIVE FOR HIGH-GRADE DYSPLASIA OR MALIGNANCY I&D Recent admission due to sepsis due to cellulitis, left groin abscess, s/p I&D   INTERVAL HISTORY Paul Donahoo [name] is a 77 y.o. male who has above history reviewed by me today presents for follow up visit for CLL, iron deficiency anemia, colon tubular adenoma with high-grade dysplastic status post resection. Patient was feeling well today.  He has resumed working.  Denies any fatigue, abdominal pain, blood in his stool.  Denies night sweats or fever.   MEDICAL HISTORY:  Past Medical History:  Diagnosis Date   Adenoma of left adrenal gland    Aortic atherosclerosis (HCC)    BPH (benign prostatic hyperplasia)    Bradycardia    CAD (coronary artery disease) 08/26/2022   a.) cCTA 08/26/2022: Ca2+ 14.3 (12th %'ile for age/sex.race match control); (<25%) pLAD   Cellulitis of left thigh 08/2022   CHF (congestive heart failure) (HCC)    a.) TTE 10/23/2015: EF 60-65%, mod-sev LVH, mild biatrial dil, degen MV disease, AoV sclerosis, asc Ao 38 mm, G1DD; b.) TTE 08/05/2022: EF 45-50%, mod-sev LVH (speckled pattern), sev biatrial dil, mild MR, AoV sclerosis, G1DD   Chronic pain syndrome    a.) on COT managed by pain management   Chronic, continuous use of opioids    a.) chronic pain syndrome/chronic back pain; managed by pain management   CKD (chronic kidney disease) stage 3, GFR 30-59 ml/min (HCC)    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   a.) Rai stage III   DDD (degenerative disc disease), cervical    Difficult airway 10/22/2015   a.) 1st attempt with glidescope  --> macroglossic (unsuccessful); b.) 2nd attempt  with Mac 4 and direct laryngoscopy with a 8.0 ETT --> tube too large to pass through the cords; b.) 3rd attempt (successful) with a Mac 4 and a 7.5 tube   Diverticulosis    DM (diabetes mellitus), type 2 (HCC)    Dysplasia of prostate    Erectile dysfunction    Frequent falls    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   History of MRSA infection 08/28/2022   a.) MRSA PCR (+) 08/28/2022; culture from LEFT thigh abscess   HTN (hypertension)    Hyperlipidemia    Hypogonadism in male    Hypothyroidism    IDA (iron deficiency anemia)    LBBB (left bundle branch block)    Long-term use of aspirin therapy    Lumbar spinal stenosis    Mild cardiomegaly    Nodule of right lung    Orchitis and epididymitis 06/16/2013   OSA on CPAP    Palindromic rheumatism, hand    Pericardial effusion    Prostatitis  Sepsis (HCC) 08/2022   Subdural hematoma (HCC) 10/22/2015   a.) s/p traumatic mechanical fall --> CT head 10/22/2015: high-density SDH the left cerebral convexity with13 mm midline shift --> s/p LEFT frontal burr hole craniotomy   Tubular adenoma of colon    Ventral hernia     SURGICAL HISTORY: Past Surgical History:  Procedure Laterality Date   BACK SURGERY     BIOPSY  08/21/2022   Procedure: BIOPSY;  Surgeon: Jaynie Collins, DO;  Location: [facility] ENDOSCOPY;  Service: Gastroenterology;;   BIOPSY  08/23/2022   Procedure: BIOPSY;  Surgeon: Jaynie Collins, DO;  Location: [facility] ENDOSCOPY;  Service: Gastroenterology;;   Guss Bunde OF CRANIUM Left 10/22/2015   COLONOSCOPY WITH PROPOFOL N/A 08/23/2022   Procedure: COLONOSCOPY WITH PROPOFOL;  Surgeon: Jaynie Collins, DO;  Location: [facility] ENDOSCOPY;  Service: Gastroenterology;  Laterality: N/A;    ESOPHAGOGASTRODUODENOSCOPY (EGD) WITH PROPOFOL N/A 08/21/2022   Procedure: ESOPHAGOGASTRODUODENOSCOPY (EGD) WITH PROPOFOL;  Surgeon: Jaynie Collins, DO;  Location: [facility] ENDOSCOPY;  Service: Gastroenterology;  Laterality: N/A;   GIVENS CAPSULE STUDY N/A 08/23/2022   Procedure: GIVENS CAPSULE STUDY;  Surgeon: Jaynie Collins, DO;  Location: [facility] ENDOSCOPY;  Service: Gastroenterology;  Laterality: N/A;   IRRIGATION AND DEBRIDEMENT ABSCESS Left 08/28/2022   Procedure: IRRIGATION AND DEBRIDEMENT ABSCESS LEFT UPPER THIGH/GROIN;  Surgeon: Leafy Ro, MD;  Location: [facility] ORS;  Service: General;  Laterality: Left;   LAPAROSCOPIC RIGHT COLECTOMY Right 09/28/2022   Procedure: LAPAROSCOPIC RIGHT COLECTOMY, RNFA to assist;  Surgeon: Leafy Ro, MD;  Location: [facility] ORS;  Service: General;  Laterality: Right;   POLYPECTOMY  08/23/2022   Procedure: POLYPECTOMY;  Surgeon: Jaynie Collins, DO;  Location: [facility] ENDOSCOPY;  Service: Gastroenterology;;   SUBMUCOSAL TATTOO INJECTION  08/23/2022   Procedure: SUBMUCOSAL TATTOO INJECTION;  Surgeon: Jaynie Collins, DO;  Location: [facility] ENDOSCOPY;  Service: Gastroenterology;;   TONSILLECTOMY     VENTRAL HERNIA REPAIR N/A 09/28/2022   Procedure: HERNIA REPAIR VENTRAL ADULT;  Surgeon: Leafy Ro, MD;  Location: [facility] ORS;  Service: General;  Laterality: N/A;    SOCIAL HISTORY: Social History   Socioeconomic History   Marital status: Married    Spouse name: Not on file   Number of children: Not on file   Years of education: Not on file   Highest education level: 5th grade  Occupational History   Not on file  Tobacco Use   Smoking status: Former    Types: Cigarettes    Passive exposure: Past   Smokeless tobacco: Never   Tobacco comments:    quit 30 years ago  Vaping Use   Vaping status: Never Used  Substance and Sexual Activity   Alcohol use: No    Alcohol/week: 0.0 standard drinks of alcohol   Drug use: No   Sexual activity:  Not Currently  Other Topics Concern   Not on file  Social History Narrative   Not on file   Social Determinants of Health   Financial Resource Strain: Medium Risk (07/26/2022)   Overall Financial Resource Strain (CARDIA)    Difficulty of Paying Living Expenses: Somewhat hard  Food Insecurity: No Food Insecurity (09/28/2022)   Hunger Vital Sign    Worried About Running Out of Food in the Last Year: Never true    Ran Out of Food in the Last Year: Never true  Recent Concern: Food Insecurity - Food Insecurity Present (07/26/2022)   Hunger Vital Sign    Worried About Running Out of  Food in the Last Year: Sometimes true    Ran Out of Food in the Last Year: Never true  Transportation Needs: No Transportation Needs (09/28/2022)   PRAPARE - Administrator, Civil Service (Medical): No    Lack of Transportation (Non-Medical): No  Physical Activity: Unknown (07/26/2022)   Exercise Vital Sign    Days of Exercise per Week: Patient declined    Minutes of Exercise per Session: 0 min  Stress: Stress Concern Present (07/26/2022)   [employer] of Occupational Health - Occupational Stress Questionnaire    Feeling of Stress : To some extent  Social Connections: Moderately Integrated (07/26/2022)   Social Connection and Isolation Panel [NHANES]    Frequency of Communication with Friends and Family: More than three times a week    Frequency of Social Gatherings with Friends and Family: Patient declined    Attends Religious Services: More than 4 times per year    Active Member of [employer] or Organizations: No    Attends Banker Meetings: Never    Marital Status: Married  Catering manager Violence: Not At Risk (09/28/2022)   Humiliation, Afraid, Rape, and Kick questionnaire    Fear of Current or Ex-Partner: No    Emotionally Abused: No    Physically Abused: No    Sexually Abused: No    FAMILY HISTORY: Family History  Problem Relation Age of Onset   Cancer Maternal Aunt    Prostate  cancer Neg Hx    Kidney disease Neg Hx    Kidney cancer Neg Hx    Bladder Cancer Neg Hx     ALLERGIES:  has No Known Allergies.  MEDICATIONS:  Current Outpatient Medications  Medication Sig Dispense Refill   aspirin EC 81 MG tablet Take 1 tablet (81 mg total) by mouth daily. Swallow whole.     cyanocobalamin (VITAMIN B12) 1000 MCG tablet Take 1 tablet (1,000 mcg total) by mouth daily. 90 tablet 0   dapagliflozin propanediol (FARXIGA) 10 MG TABS tablet Take 1 tablet (10 mg total) by mouth daily before breakfast. 90 tablet 3   KLOR-CON M20 20 MEQ tablet Take 20 mEq by mouth at bedtime.     levothyroxine (SYNTHROID) 200 MCG tablet Take 200 mcg by mouth daily before breakfast.     metFORMIN (GLUCOPHAGE) 500 MG tablet Take 500 mg by mouth 2 (two) times daily with a meal.     methocarbamol (ROBAXIN) 500 MG tablet Take 1 tablet (500 mg total) by mouth every 8 (eight) hours as needed for muscle spasms. 30 tablet 0   metoprolol succinate (TOPROL XL) 25 MG 24 hr tablet Take 1 tablet (25 mg total) by mouth daily. (Patient taking differently: Take 25 mg by mouth every morning.) 90 tablet 3   ondansetron (ZOFRAN-ODT) 4 MG disintegrating tablet Take 1 tablet (4 mg total) by mouth every 6 (six) hours as needed for nausea. 20 tablet 0   [START ON 10/22/2022] oxyCODONE-acetaminophen (PERCOCET) 10-325 MG tablet Take 1 tablet by mouth every 8 (eight) hours as needed. 90 tablet 0   pregabalin (LYRICA) 50 MG capsule Take 1 capsule (50 mg total) by mouth 3 (three) times daily for 14 days. 42 capsule 0   rosuvastatin (CRESTOR) 40 MG tablet Take 1 tablet (40 mg total) by mouth daily. (Patient taking differently: Take 40 mg by mouth every morning.) 90 tablet 3   sacubitril-valsartan (ENTRESTO) 24-26 MG Take 1 tablet by mouth 2 (two) times daily. 180 tablet 3   tamsulosin (FLOMAX)  0.4 MG CAPS capsule Take 0.4 mg by mouth daily.     hydrALAZINE (APRESOLINE) 50 MG tablet Take 50 mg by mouth 2 (two) times daily.     No  current facility-administered medications for this visit.    Review of Systems  Constitutional:  Positive for fatigue. Negative for appetite change, chills, fever and unexpected weight change.  HENT:   Negative for hearing loss and voice change.   Eyes:  Negative for eye problems and icterus.  Respiratory:  Negative for chest tightness, cough and shortness of breath.   Cardiovascular:  Negative for chest pain and leg swelling.  Gastrointestinal:  Negative for abdominal distention and abdominal pain.  Endocrine: Negative for hot flashes.  Genitourinary:  Negative for difficulty urinating, dysuria and frequency.   Musculoskeletal:  Positive for arthralgias.  Skin:  Negative for itching and rash.  Neurological:  Negative for light-headedness and numbness.  Hematological:  Negative for adenopathy. Does not bruise/bleed easily.  Psychiatric/Behavioral:  Negative for confusion.      PHYSICAL EXAMINATION: ECOG PERFORMANCE STATUS: 1 - Symptomatic but completely ambulatory  Vitals:   10/18/22 1318 10/18/22 1326  BP: (!) 158/87 (!) 140/90  Pulse: 75   Resp: 18   Temp: (!) 96 F (35.6 C)   SpO2: 100%    Filed Weights   10/18/22 1318  Weight: 216 lb 1.6 oz (98 kg)    Physical Exam Constitutional:      General: He is not in acute distress.    Appearance: He is obese. He is not diaphoretic.  HENT:     Head: Normocephalic and atraumatic.  Eyes:     General: No scleral icterus.    Pupils: Pupils are equal, round, and reactive to light.  Cardiovascular:     Rate and Rhythm: Normal rate.     Heart sounds: No murmur heard. Pulmonary:     Effort: Pulmonary effort is normal. No respiratory distress.     Breath sounds: No wheezing.  Abdominal:     General: There is no distension.     Palpations: Abdomen is soft.     Tenderness: There is no abdominal tenderness.  Musculoskeletal:     Cervical back: Normal range of motion and neck supple.  Skin:    General: Skin is warm and dry.      Findings: No erythema.  Neurological:     Mental Status: He is alert and oriented to person, place, and time. Mental status is at baseline.     Cranial Nerves: No cranial nerve deficit.     Motor: No abnormal muscle tone.  Psychiatric:        Mood and Affect: Mood and affect normal.      LABORATORY DATA:  I have reviewed the data as listed    Latest Ref Rng & Units 10/18/2022    1:04 PM 10/04/2022    6:00 AM 10/02/2022    7:07 AM  CBC  WBC 4.0 - 10.5 K/uL 7.5  10.1  9.5   Hemoglobin 13.0 - 17.0 g/dL 8.6  9.2  9.5   Hematocrit 39.0 - 52.0 % 28.1  29.3  31.1   Platelets 150 - 400 K/uL 360  173  151       Latest Ref Rng & Units 10/18/2022    1:04 PM 10/06/2022    5:06 AM 10/05/2022    4:57 AM  CMP  Glucose 70 - 99 mg/dL 161  096  045   BUN 8 - 23 mg/dL 14  15  17   Creatinine 0.61 - 1.24 mg/dL 1.61  0.96  0.45   Sodium 135 - 145 mmol/L 143  139  136   Potassium 3.5 - 5.1 mmol/L 4.1  4.2  3.5   Chloride 98 - 111 mmol/L 108  113  111   CO2 22 - 32 mmol/L 24  22  21    Calcium 8.9 - 10.3 mg/dL 8.9  8.3  8.2   Total Protein 6.5 - 8.1 g/dL 6.6     Total Bilirubin 0.3 - 1.2 mg/dL 0.7     Alkaline Phos 38 - 126 U/L 81     AST 15 - 41 U/L 29     ALT 0 - 44 U/L 14        RADIOGRAPHIC STUDIES: I have personally reviewed the radiological images as listed and agreed with the findings in the report. CT ABDOMEN PELVIS W CONTRAST  Result Date: 10/11/2022 CLINICAL DATA:  Postsurgical right-sided abdominal pain, recent right hemicolectomy and ventral hernia repair, history of CLL EXAM: CT ABDOMEN AND PELVIS WITH CONTRAST TECHNIQUE: Multidetector CT imaging of the abdomen and pelvis was performed using the standard protocol following bolus administration of intravenous contrast. RADIATION DOSE REDUCTION: This exam was performed according to the departmental dose-optimization program which includes automated exposure control, adjustment of the mA and/or kV according to patient size and/or use of  iterative reconstruction technique. CONTRAST:  OMNIPAQUE IOHEXOL 300 MG/ML  SOLN COMPARISON:  08/26/2022 FINDINGS: Lower chest: Trace left pleural effusion and left lower lobe atelectasis. No acute airspace disease. Stable cardiomegaly without pericardial effusion. Hepatobiliary: No focal liver abnormality is seen. No gallstones, gallbladder wall thickening, or biliary dilatation. 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 No inflammatory changes or pancreatic duct dilation. Spleen: Stable indeterminate 2 cm hypodensity medial aspect of the spleen. The remainder of the spleen is unremarkable. Adrenals/Urinary Tract: Stable appearance of the bilateral kidneys. No acute renal abnormality. Stable 1.8 cm left adrenal adenoma again noted. Right adrenal is unremarkable. Bladder is minimally distended, without filling defect. Stomach/Bowel: Interval right hemicolectomy with ileocolic anastomosis. No evidence of bowel obstruction or ileus. There is moderate retained stool within the colon compatible with constipation. No bowel wall thickening or inflammatory change. Vascular/Lymphatic: Aortic atherosclerosis. Stable bilateral external iliac chain lymphadenopathy consistent with history of CLL. Index lymph nodes are as follows: Left external iliac, image 70/8, 2.6 cm.  Previously 2.9 cm. Right external iliac, image 68/8, 1.1 cm.  Previously 1.0 cm. Right external iliac, image 62/8, 1.3 cm.  Previously 1.5 cm. No new pathologic adenopathy. Reproductive: Prostate is unremarkable. Other: Trace upper abdominal ascites. No free intraperitoneal gas. Interval repair of a right paraumbilical fat containing ventral hernia. Marked edema within the subcutaneous fat at the site of hernia repair, without evidence of fluid collection or abscess. Musculoskeletal: No acute or destructive bony abnormalities. Reconstructed images demonstrate no additional  findings. IMPRESSION: 1. Postsurgical changes from right hemicolectomy and ileocolic anastomosis. No bowel obstruction or ileus. 2. Moderate retained stool within the colon may reflect constipation. 3. Interval repair of a right paraumbilical fat containing ventral hernia. Edema within the subcutaneous fat at site of hernia repair, without evidence of fluid collection or abscess. 4. Trace upper abdominal ascites. 5. Small left pleural effusion with minimal left basilar atelectasis. 6. Stable bilateral external iliac chain adenopathy, consistent with patient's history of CLL. 7. Stable indeterminate splenic hypodensity, leukemic involvement cannot be excluded. 8.  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 Follow-up in 2 years with dedicated pancreatic MRI or CT recommended. 9.  Aortic Atherosclerosis (ICD10-I70.0). Electronically Signed   By: Sharlet Salina M.D.   On: 10/11/2022 17:45   DG ABD ACUTE 2+V W 1V CHEST  Result Date: 10/01/2022 CLINICAL DATA:  Ileus following gastrointestinal surgery EXAM: DG ABDOMEN ACUTE WITH 1 VIEW CHEST COMPARISON:  09/30/2022, CT 08/26/2022 FINDINGS: Single view chest demonstrates cardiomegaly. No pleural effusion or pneumothorax. Supine and upright views of the abdomen demonstrate trace free air beneath the right diaphragm similar if not slightly decreased compared to yesterday's radiograph. Mild diffuse increased small and large bowel gas suggestive of ileus with slight interval decrease in small and large bowel gaseous distension. Decreased fluid levels compared to prior. IMPRESSION: 1. Slight interval decrease in small and large bowel gaseous distension with decreased fluid levels suggestive of improving ileus. 2. Trace free air beneath the right diaphragm similar if not slightly decreased compared to prior and likely due to recent surgery Electronically Signed   By: Jasmine Pang  M.D.   On: 10/01/2022 16:48   DG ABD ACUTE 2+V W 1V CHEST  Result Date: 09/30/2022 CLINICAL DATA:  Ileus, abdominal soreness. Recent laparoscopic surgery. EXAM: DG ABDOMEN ACUTE WITH 1 VIEW CHEST COMPARISON:  CT abdomen/pelvis 08/26/2022 FINDINGS: Chest: The heart is enlarged. The upper mediastinal contours are normal. There is no focal consolidation or pulmonary edema. There is no pleural effusion or pneumothorax. There is no acute osseous abnormality. Abdomen: There is free intraperitoneal air under the diaphragm. There is diffuse gaseous distention of the small and large bowel. There is no acute osseous abnormality. IMPRESSION: 1. Free intraperitoneal air consistent with the history of recent laparoscopic surgery. 2. Gaseous distention of the small and large bowel may reflect ileus. 3. Cardiomegaly.  No focal consolidation or pleural effusion. Electronically Signed   By: Lesia Hausen M.D.   On: 09/30/2022 13:15

## 2022-10-19 ENCOUNTER — Ambulatory Visit: Payer: No Typology Code available for payment source | Admitting: Physician Assistant

## 2022-10-19 ENCOUNTER — Telehealth: Payer: Self-pay

## 2022-10-19 ENCOUNTER — Other Ambulatory Visit: Payer: Self-pay

## 2022-10-19 DIAGNOSIS — Z7689 Persons encountering health services in other specified circumstances: Secondary | ICD-10-CM

## 2022-10-19 NOTE — Telephone Encounter (Signed)
-----   Message from [name] sent at 10/18/2022  8:48 PM EDT ----- Please arrange patient to get additional Venofer weekly x 2.  Thank you.  Keep current appointment.

## 2022-10-20 NOTE — Progress Notes (Signed)
Paul Briggs is 3 weeks out following right colectomy.  Pathology discussed with him in detail.  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  Discussed with him in detail.  He did have a CT scan that I personally reviewed showing no evidence of leak abscess or complications. Ambulating taking Po, + BM  Pe NAD  Abd: soft, nt no peritonitis or infection  A/P Doing well w/o complications  F/u 2-3 months

## 2022-10-21 ENCOUNTER — Encounter: Payer: No Typology Code available for payment source | Admitting: Neurosurgery

## 2022-10-21 DIAGNOSIS — Z599 Problem related to housing and economic circumstances, unspecified: Secondary | ICD-10-CM | POA: Diagnosis not present

## 2022-10-21 DIAGNOSIS — R69 Illness, unspecified: Secondary | ICD-10-CM | POA: Diagnosis not present

## 2022-10-21 DIAGNOSIS — M7052 Other bursitis of knee, left knee: Secondary | ICD-10-CM | POA: Diagnosis not present

## 2022-10-26 ENCOUNTER — Other Ambulatory Visit: Payer: Self-pay

## 2022-10-26 ENCOUNTER — Encounter: Payer: Self-pay | Admitting: Oncology

## 2022-10-26 ENCOUNTER — Emergency Department
Admission: EM | Admit: 2022-10-26 | Discharge: 2022-10-26 | Disposition: A | Discharge status: Home / Self Care | Payer: PRIVATE HEALTH INSURANCE | Attending: Emergency Medicine | Admitting: Emergency Medicine

## 2022-10-26 DIAGNOSIS — L7622 Postprocedural hemorrhage and hematoma of skin and subcutaneous tissue following other procedure: Secondary | ICD-10-CM | POA: Insufficient documentation

## 2022-10-26 DIAGNOSIS — R58 Hemorrhage, not elsewhere classified: Secondary | ICD-10-CM

## 2022-10-26 DIAGNOSIS — L929 Granulomatous disorder of the skin and subcutaneous tissue, unspecified: Secondary | ICD-10-CM | POA: Diagnosis not present

## 2022-10-26 DIAGNOSIS — L7682 Other postprocedural complications of skin and subcutaneous tissue: Secondary | ICD-10-CM

## 2022-10-26 DIAGNOSIS — K91840 Postprocedural hemorrhage and hematoma of a digestive system organ or structure following a digestive system procedure: Secondary | ICD-10-CM | POA: Diagnosis not present

## 2022-10-26 LAB — CBC
HCT: 31.6 % — ABNORMAL LOW (ref 39.0–52.0)
Hemoglobin: 9.4 g/dL — ABNORMAL LOW (ref 13.0–17.0)
MCH: 28 pg (ref 26.0–34.0)
MCHC: 29.7 g/dL — ABNORMAL LOW (ref 30.0–36.0)
MCV: 94 fL (ref 80.0–100.0)
Platelets: 230 10*3/uL (ref 150–400)
RBC: 3.36 MIL/uL — ABNORMAL LOW (ref 4.22–5.81)
RDW: 20.4 % — ABNORMAL HIGH (ref 11.5–15.5)
WBC: 7.4 10*3/uL (ref 4.0–10.5)
nRBC: 0 % (ref 0.0–0.2)

## 2022-10-26 LAB — LACTIC ACID, PLASMA: Lactic Acid, Venous: 1.5 mmol/L (ref 0.5–1.9)

## 2022-10-26 LAB — BASIC METABOLIC PANEL
Anion gap: 7 (ref 5–15)
BUN: 15 mg/dL (ref 8–23)
CO2: 26 mmol/L (ref 22–32)
Calcium: 8.4 mg/dL — ABNORMAL LOW (ref 8.9–10.3)
Chloride: 106 mmol/L (ref 98–111)
Creatinine, Ser: 1.11 mg/dL (ref 0.61–1.24)
GFR, Estimated: >60 mL/min (ref >60)
Glucose, Bld: 113 mg/dL — ABNORMAL HIGH (ref 70–99)
Potassium: 4.1 mmol/L (ref 3.5–5.1)
Sodium: 139 mmol/L (ref 135–145)

## 2022-10-26 MED ORDER — SILVER NITRATE-POT NITRATE 75-25 % EX MISC
1.0000 | Freq: Once | CUTANEOUS | Status: AC
  Administered 2022-10-26: 1 via TOPICAL
  Filled 2022-10-26: qty 10

## 2022-10-26 NOTE — ED Provider Notes (Signed)
[facility] Provider Note    Event Date/Time   First MD Initiated Contact with Patient 10/26/22 1418     (approximate)   History   Post-op Problem   HPI  Paul Steidle [name] is a 77 y.o. male who is status post tumor resection he is concern for bleeding from his wound.  Patient reports that he had surgery with Dr. Everlene Farrier.  He reports no fevers or abdominal pain just that he is having drainage from his left upper thigh wound.  He reports that it is a bloody drainage.  He states that he also has a midline incision that has similar discharge to when he was first seen after the surgery and has had prior CT imaging that has not showed any abscess.  He denies any new concerns with this 1.  He states his only concern was the little bit of blood that come continuously drains out of the left upper thigh.   Physical Exam   Triage Vital Signs: ED Triage Vitals  Encounter Vitals Group     BP 10/26/22 1152 (!) 178/88     Systolic BP Percentile --      Diastolic BP Percentile --      Pulse Rate 10/26/22 1152 (!) 57     Resp 10/26/22 1152 19     Temp 10/26/22 1152 97.8 F (36.6 C)     Temp src --      SpO2 10/26/22 1152 100 %     Weight --      Height --      Head Circumference --      Peak Flow --      Pain Score 10/26/22 1151 3     Pain Loc --      Pain Education --      Exclude from Growth Chart --     Most recent vital signs: Vitals:   10/26/22 1152  BP: (!) 178/88  Pulse: (!) 57  Resp: 19  Temp: 97.8 F (36.6 C)  SpO2: 100%     General: Awake, no distress.  CV:  Good peripheral perfusion.  Resp:  Normal effort.  Abd:  No distention.  Other:  Patient has midline incision that is without erythema or significant drainage.  He is got a left upper thigh incision with mild venous oozing noted from it.  His abdomen is soft and nontender.   ED Results / Procedures / Treatments   Labs (all labs ordered are listed, but only abnormal results  are displayed) Labs Reviewed  CBC - Abnormal; Notable for the following components:      Result Value   RBC 3.36 (*)    Hemoglobin 9.4 (*)    HCT 31.6 (*)    MCHC 29.7 (*)    RDW 20.4 (*)    All other components within normal limits  BASIC METABOLIC PANEL - Abnormal; Notable for the following components:   Glucose, Bld 113 (*)    Calcium 8.4 (*)    All other components within normal limits  LACTIC ACID, PLASMA  LACTIC ACID, PLASMA       PROCEDURES:  Critical Care performed: No  Procedures   MEDICATIONS ORDERED IN ED: Medications - No data to display   IMPRESSION / MDM / ASSESSMENT AND PLAN / ED COURSE  I reviewed the triage vital signs and the nursing notes.   Patient's presentation is most consistent with exacerbation of chronic illness.   Patient comes in with concern  for wound drainage from his left upper thigh.  Patient has no fever.  He is got no abdominal tenderness doubt this represents a infection.  He only is having blood noted from the left upper wound.  Low suspicion for cellulitis, abscess based on examination.  BMP is reassuring CBC shows stable white count hemoglobin is at baseline.  Lactate is normal.  He has had prior CT imaging less than a month ago that did not show any abscess or complications.  I discussed the case with Dr. Everlene Farrier and PA Manus Rudd will come down to evaluate patient.  Patient handed off pending their evaluation    FINAL CLINICAL IMPRESSION(S) / ED DIAGNOSES   Final diagnoses:  Bleeding  Postoperative surgical complication involving skin associated with non-dermatologic procedure, unspecified complication     Rx / DC Orders   ED Discharge Orders     None        Note:  This document was prepared using Dragon voice recognition software and may include unintentional dictation errors.   Concha Se, MD 10/26/22 [phone]

## 2022-10-26 NOTE — ED Triage Notes (Addendum)
Pt comes with c/o post op bleeding and possible infection to abdomen. Pt states he had surgery month ago. Pt states 4-5 days ago. Pt states little discoloration noted. Pt states he has to keep changing bandage.   Pt states Sept 3 he  had some of his colon removed.

## 2022-10-26 NOTE — ED Provider Notes (Signed)
Patient has been seen and evaluated by general surgery.  Silver nitrate utilized by general surgery team to coagulate bleeding.  General surgery is advised patient ready for discharge, will continue to follow with surgery   Sharyn Creamer, MD 10/26/22 1637

## 2022-10-26 NOTE — Discharge Instructions (Signed)
Call 911 and return to the ER if: You have a fever. Your wound is more red and swollen. Your wound bed declines: areas that were red and pink develop new dead (necrotic) tissue (black, grey, yellow). You have increased bleeding. You have pus coming from your wound. You have a bad smell coming from your wound. Your wound is not getting better after 1-2 weeks of treatment. You develop a fever associated with increasing pain and redness around your wound. Other symptoms or concerns arise

## 2022-10-26 NOTE — Consult Note (Signed)
[facility] SURGICAL CONSULTATION NOTE (initial) - cpt: 21308   HISTORY OF PRESENT ILLNESS (HPI):  77 y.o. male presented to [facility] ED today for evaluation of wound issues. Patient well known to our service following left groin incision and drainage in August of 2024 with Dr Everlene Farrier and then subsequent right hemicolectomy on 09/03 for colon mass also with Dr Everlene Farrier. He has done well in follow up. Had repeat CT Abdomen/pelvis on 09/16 which was reassuring. He presents to the ED today secondary to concerns over bleeding from this left groin wound. This stated in the last 24 hours. Significant oozing with blood dripping down his leg. He is unable to get this to stop. He reports only being on 81 mg ASA. No fever, chills, nausea, emesis, abdominal pain. No other complaints. Work up in the ED revealed a normal WBC at 7.4K, Hgb to 9.4 (up from 8.6), sCr - 1.11. He did not have any imaging.   Surgery is consulted by emergency medicine physician Dr. Artis Delay, MD in this context for evaluation and management of post-op wound bleeding.   PAST MEDICAL HISTORY (PMH):  Past Medical History:  Diagnosis Date   Adenoma of left adrenal gland    Aortic atherosclerosis (HCC)    BPH (benign prostatic hyperplasia)    Bradycardia    CAD (coronary artery disease) 08/26/2022   a.) cCTA 08/26/2022: Ca2+ 14.3 (12th %'ile for age/sex.race match control); (<25%) pLAD   Cellulitis of left thigh 08/2022   CHF (congestive heart failure) (HCC)    a.) TTE 10/23/2015: EF 60-65%, mod-sev LVH, mild biatrial dil, degen MV disease, AoV sclerosis, asc Ao 38 mm, G1DD; b.) TTE 08/05/2022: EF 45-50%, mod-sev LVH (speckled pattern), sev biatrial dil, mild MR, AoV sclerosis, G1DD   Chronic pain syndrome    a.) on COT managed by pain management   Chronic, continuous use of opioids    a.) chronic pain syndrome/chronic back pain; managed by pain management   CKD (chronic kidney disease) stage 3, GFR 30-59 ml/min (HCC)     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   a.) Rai stage III   DDD (degenerative disc disease), cervical    Difficult airway 10/22/2015   a.) 1st attempt with glidescope  --> macroglossic (unsuccessful); b.) 2nd attempt with Mac 4 and direct laryngoscopy with a 8.0 ETT --> tube too large to pass through the cords; b.) 3rd attempt (successful) with a Mac 4 and a 7.5 tube   Diverticulosis    DM (diabetes mellitus), type 2 (HCC)    Dysplasia of prostate    Erectile dysfunction    Frequent falls    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   History of MRSA infection 08/28/2022   a.) MRSA PCR (+) 08/28/2022; culture from LEFT thigh abscess   HTN (hypertension)    Hyperlipidemia    Hypogonadism in male    Hypothyroidism    IDA (iron deficiency anemia)    LBBB (left bundle branch block)    Long-term use of aspirin therapy    Lumbar spinal stenosis    Mild cardiomegaly    Nodule of right lung    Orchitis and epididymitis 06/16/2013   OSA on CPAP    Palindromic rheumatism, hand    Pericardial effusion    Prostatitis  Sepsis (HCC) 08/2022   Subdural hematoma (HCC) 10/22/2015   a.) s/p traumatic mechanical fall --> CT head 10/22/2015: high-density SDH the left cerebral convexity with13 mm midline shift --> s/p LEFT frontal burr hole craniotomy   Tubular adenoma of colon    Ventral hernia      PAST SURGICAL HISTORY (PSH):  Past Surgical History:  Procedure Laterality Date   BACK SURGERY     BIOPSY  08/21/2022   Procedure: BIOPSY;  Surgeon: Jaynie Collins, DO;  Location[facility] ENDOSCOPY;  Service: Gastroenterology;;   BIOPSY  08/23/2022   Procedure: BIOPSY;  Surgeon: Jaynie Collins, DO;  Location: [facility] ENDOSCOPY;  Service:  Gastroenterology;;   Guss Bunde OF CRANIUM Left 10/22/2015   COLONOSCOPY WITH PROPOFOL N/A 08/23/2022   Procedure: COLONOSCOPY WITH PROPOFOL;  Surgeon: Jaynie Collins, DO;  Location: [facility] ENDOSCOPY;  Service: Gastroenterology;  Laterality: N/A;   ESOPHAGOGASTRODUODENOSCOPY (EGD) WITH PROPOFOL N/A 08/21/2022   Procedure: ESOPHAGOGASTRODUODENOSCOPY (EGD) WITH PROPOFOL;  Surgeon: Jaynie Collins, DO;  Location: [facility] ENDOSCOPY;  Service: Gastroenterology;  Laterality: N/A;   GIVENS CAPSULE STUDY N/A 08/23/2022   Procedure: GIVENS CAPSULE STUDY;  Surgeon: Jaynie Collins, DO;  Location: [facility] ENDOSCOPY;  Service: Gastroenterology;  Laterality: N/A;   IRRIGATION AND DEBRIDEMENT ABSCESS Left 08/28/2022   Procedure: IRRIGATION AND DEBRIDEMENT ABSCESS LEFT UPPER THIGH/GROIN;  Surgeon: Leafy Ro, MD;  Location: [facility] ORS;  Service: General;  Laterality: Left;   LAPAROSCOPIC RIGHT COLECTOMY Right 09/28/2022   Procedure: LAPAROSCOPIC RIGHT COLECTOMY, RNFA to assist;  Surgeon: Leafy Ro, MD;  Location: [facility] ORS;  Service: General;  Laterality: Right;   POLYPECTOMY  08/23/2022   Procedure: POLYPECTOMY;  Surgeon: Jaynie Collins, DO;  Location: [facility] ENDOSCOPY;  Service: Gastroenterology;;   SUBMUCOSAL TATTOO INJECTION  08/23/2022   Procedure: SUBMUCOSAL TATTOO INJECTION;  Surgeon: Jaynie Collins, DO;  Location: [facility] ENDOSCOPY;  Service: Gastroenterology;;   TONSILLECTOMY     VENTRAL HERNIA REPAIR N/A 09/28/2022   Procedure: HERNIA REPAIR VENTRAL ADULT;  Surgeon: Leafy Ro, MD;  Location: [facility] ORS;  Service: General;  Laterality: N/A;     MEDICATIONS:  Prior to Admission medications   Medication Sig Start Date End Date Taking? Authorizing Provider  aspirin EC 81 MG tablet Take 1 tablet (81 mg total) by mouth daily. Swallow whole. 08/09/22   Furth, Cadence H, PA-C  cyanocobalamin (VITAMIN B12) 1000 MCG tablet Take 1 tablet (1,000 mcg total) by mouth daily. 10/18/22   [name], MD  dapagliflozin propanediol (FARXIGA) 10 MG TABS tablet Take 1 tablet (10 mg total) by mouth daily before breakfast. 08/09/22   Furth, Cadence H, PA-C  hydrALAZINE (APRESOLINE) 50 MG tablet Take 50 mg by mouth 2 (two) times daily.    [provider]  KLOR-CON M20 20 MEQ tablet Take 20 mEq by mouth at bedtime. 10/01/22   [provider]  levothyroxine (SYNTHROID) 200 MCG tablet Take 200 mcg by mouth daily before breakfast. 01/22/22   [provider]  metFORMIN (GLUCOPHAGE) 500 MG tablet Take 500 mg by mouth 2 (two) times daily with a meal.    [provider]  methocarbamol (ROBAXIN) 500 MG tablet Take 1 tablet (500 mg total) by mouth every 8 (eight) hours as needed for muscle spasms. 10/06/22   Donovan Kail, PA-C  metoprolol succinate (TOPROL XL) 25 MG 24 hr tablet Take 1 tablet (25 mg total) by mouth daily. Patient taking differently: Take 25 mg by mouth every morning. 08/09/22  Furth, Cadence H, PA-C  ondansetron (ZOFRAN-ODT) 4 MG disintegrating tablet Take 1 tablet (4 mg total) by mouth every 6 (six) hours as needed for nausea. 10/06/22   Donovan Kail, PA-C  oxyCODONE-acetaminophen (PERCOCET) 10-325 MG tablet Take 1 tablet by mouth every 8 (eight) hours as needed. 10/22/22 11/21/22  Edward Jolly, MD  pregabalin (LYRICA) 50 MG capsule Take 1 capsule (50 mg total) by mouth 3 (three) times daily for 14 days. 10/06/22 10/20/22  Donovan Kail, PA-C  rosuvastatin (CRESTOR) 40 MG tablet Take 1 tablet (40 mg total) by mouth daily. Patient taking differently: Take 40 mg by mouth every morning. 08/09/22 11/07/22  Furth, Cadence H, PA-C  sacubitril-valsartan (ENTRESTO) 24-26 MG Take 1 tablet by mouth 2 (two) times daily. 09/17/22   Furth, Cadence H, PA-C  tamsulosin (FLOMAX) 0.4 MG CAPS capsule Take 0.4 mg by mouth daily.    [provider]     ALLERGIES:  No Known Allergies   SOCIAL HISTORY:  Social History   Socioeconomic History   Marital  status: Married    Spouse name: Not on file   Number of children: Not on file   Years of education: Not on file   Highest education level: 5th grade  Occupational History   Not on file  Tobacco Use   Smoking status: Former    Types: Cigarettes    Passive exposure: Past   Smokeless tobacco: Never   Tobacco comments:    quit 30 years ago  Vaping Use   Vaping status: Never Used  Substance and Sexual Activity   Alcohol use: No    Alcohol/week: 0.0 standard drinks of alcohol   Drug use: No   Sexual activity: Not Currently  Other Topics Concern   Not on file  Social History Narrative   Not on file   Social Determinants of Health   Financial Resource Strain: Medium Risk (10/21/2022)   Received from [facility]   Overall Financial Resource Strain (CARDIA)    Difficulty of Paying Living Expenses: Somewhat hard  Food Insecurity: Food Insecurity Present (10/21/2022)   Received from [facility]   Hunger Vital Sign    Worried About Running Out of Food in the Last Year: Sometimes true    Ran Out of Food in the Last Year: Sometimes true  Transportation Needs: Unmet Transportation Needs (10/21/2022)   Received from [facility]   PRAPARE - Transportation    In the past 12 months, has lack of transportation kept you from medical appointments or from getting medications?: No    Lack of Transportation (Non-Medical): Yes  Physical Activity: Unknown (07/26/2022)   Exercise Vital Sign    Days of Exercise per Week: Patient declined    Minutes of Exercise per Session: 0 min  Stress: Stress Concern Present (07/26/2022)   [employer] of Occupational Health - Occupational Stress Questionnaire    Feeling of Stress : To some extent  Social Connections: Moderately Integrated (07/26/2022)   Social Connection and Isolation Panel [NHANES]    Frequency of Communication with Friends and Family: More than three times a week    Frequency of Social  Gatherings with Friends and Family: Patient declined    Attends Religious Services: More than 4 times per year    Active Member of [employer] or Organizations: No    Attends Banker Meetings: Never    Marital Status: Married  Catering manager Violence: Not At Risk (09/28/2022)   Humiliation,  Afraid, Rape, and Kick questionnaire    Fear of Current or Ex-Partner: No    Emotionally Abused: No    Physically Abused: No    Sexually Abused: No     FAMILY HISTORY:  Family History  Problem Relation Age of Onset   Cancer Maternal Aunt    Prostate cancer Neg Hx    Kidney disease Neg Hx    Kidney cancer Neg Hx    Bladder Cancer Neg Hx       REVIEW OF SYSTEMS:  Review of Systems  Constitutional:  Negative for chills and fever.  Respiratory:  Negative for cough and shortness of breath.   Cardiovascular:  Negative for chest pain and palpitations.  Gastrointestinal:  Negative for abdominal pain, nausea and vomiting.  Genitourinary:  Negative for dysuria and urgency.  Skin:  Negative for itching and rash.       + Post-Op wound bleeding   All other systems reviewed and are negative.   VITAL SIGNS:  Temp:  [97.8 F (36.6 C)] 97.8 F (36.6 C) (10/01 1152) Pulse Rate:  [57] 57 (10/01 1152) Resp:  [19] 19 (10/01 1152) BP: (178)/(88) 178/88 (10/01 1152) SpO2:  [100 %] 100 % (10/01 1152)             INTAKE/OUTPUT:  No intake/output data recorded.  PHYSICAL EXAM:  Physical Exam Vitals and nursing note reviewed. Exam conducted with a chaperone present.  Constitutional:      General: He is not in acute distress.    Appearance: Normal appearance. He is normal weight. He is not ill-appearing.     Comments: Patient resting in bed; NAD  HENT:     Head: Normocephalic and atraumatic.  Eyes:     General: No scleral icterus.    Conjunctiva/sclera: Conjunctivae normal.     Pupils: Pupils are equal, round, and reactive to light.  Pulmonary:     Effort: Pulmonary effort is normal. No  respiratory distress.  Abdominal:     General: Abdomen is flat. A surgical scar is present. There is no distension.     Palpations: Abdomen is soft.     Tenderness: There is no abdominal tenderness.     Comments: Abdomen is soft, non-tender, non-distended, no rebound/guarding. Midline laparotomy wound is healing well, no erythema. There is scant serous drainage on dressing  Genitourinary:    Comments: Deferred Skin:    General: Skin is warm and dry.     Comments: Left inguinal wound closed however with hypergranulation tissue, there is oozing from this. No erythema.   Neurological:     General: No focal deficit present.     Mental Status: He is alert and oriented to person, place, and time. Mental status is at baseline.  Psychiatric:        Mood and Affect: Mood normal.        Behavior: Behavior normal.     Left Inguinal Wound (10/26/2022)   Midline Laparotomy Wound (10/26/2022):     Labs:     Latest Ref Rng & Units 10/26/2022   11:56 AM 10/18/2022    1:04 PM 10/04/2022    6:00 AM  CBC  WBC 4.0 - 10.5 K/uL 7.4  7.5  10.1   Hemoglobin 13.0 - 17.0 g/dL 9.4  8.6  9.2   Hematocrit 39.0 - 52.0 % 31.6  28.1  29.3   Platelets 150 - 400 K/uL 230  360  173       Latest Ref Rng & Units 10/26/2022  11:56 AM 10/18/2022    1:04 PM 10/06/2022    5:06 AM  CMP  Glucose 70 - 99 mg/dL 098  119  147   BUN 8 - 23 mg/dL 15  14  15    Creatinine 0.61 - 1.24 mg/dL 8.29  5.62  1.30   Sodium 135 - 145 mmol/L 139  143  139   Potassium 3.5 - 5.1 mmol/L 4.1  4.1  4.2   Chloride 98 - 111 mmol/L 106  108  113   CO2 22 - 32 mmol/L 26  24  22    Calcium 8.9 - 10.3 mg/dL 8.4  8.9  8.3   Total Protein 6.5 - 8.1 g/dL  6.6    Total Bilirubin 0.3 - 1.2 mg/dL  0.7    Alkaline Phos 38 - 126 U/L  81    AST 15 - 41 U/L  29    ALT 0 - 44 U/L  14       Imaging studies:  No pertinent imaging studies   Assessment/Plan:  77 y.o. male with oozing from hypergranulation tissue in left inguinal wound   -  silver nitrate applied to hypergranulation tissue to help with oozing and facilitate scarring. Patient tolerated without issue. Will place pressure dressing as well  - No evidence of infection; No indication for antibiotics  - Okay for discharge from surgical perspective; He can follow up with [place] in 1-2 weeks for wound check.  All of the above findings and recommendations were discussed with the patient and his family, and all of their questions were answered to their expressed satisfaction.  Thank you for the opportunity to participate in this patient's care.   -- Lynden Oxford, PA-C [facility] 10/26/2022, 4:07 PM M-F: 7am - 4pm

## 2022-10-28 ENCOUNTER — Inpatient Hospital Stay: Payer: No Typology Code available for payment source | Attending: Oncology

## 2022-10-28 VITALS — BP 148/70 | HR 60 | Temp 98.0°F | Resp 16

## 2022-10-28 DIAGNOSIS — J90 Pleural effusion, not elsewhere classified: Secondary | ICD-10-CM | POA: Insufficient documentation

## 2022-10-28 DIAGNOSIS — I700 Atherosclerosis of aorta: Secondary | ICD-10-CM | POA: Diagnosis not present

## 2022-10-28 DIAGNOSIS — I130 Hypertensive heart and chronic kidney disease with heart failure and stage 1 through stage 4 chronic kidney disease, or unspecified chronic kidney disease: Secondary | ICD-10-CM | POA: Diagnosis not present

## 2022-10-28 DIAGNOSIS — C9110 Chronic lymphocytic leukemia of B-cell type not having achieved remission: Secondary | ICD-10-CM

## 2022-10-28 DIAGNOSIS — I2510 Atherosclerotic heart disease of native coronary artery without angina pectoris: Secondary | ICD-10-CM | POA: Insufficient documentation

## 2022-10-28 DIAGNOSIS — D509 Iron deficiency anemia, unspecified: Secondary | ICD-10-CM | POA: Diagnosis not present

## 2022-10-28 DIAGNOSIS — E1122 Type 2 diabetes mellitus with diabetic chronic kidney disease: Secondary | ICD-10-CM | POA: Diagnosis not present

## 2022-10-28 DIAGNOSIS — N183 Chronic kidney disease, stage 3 unspecified: Secondary | ICD-10-CM | POA: Insufficient documentation

## 2022-10-28 DIAGNOSIS — R188 Other ascites: Secondary | ICD-10-CM | POA: Diagnosis not present

## 2022-10-28 DIAGNOSIS — I509 Heart failure, unspecified: Secondary | ICD-10-CM | POA: Diagnosis not present

## 2022-10-28 MED ORDER — SODIUM CHLORIDE 0.9 % IV SOLN
200.0000 mg | Freq: Once | INTRAVENOUS | Status: AC
  Administered 2022-10-28: 200 mg via INTRAVENOUS
  Filled 2022-10-28: qty 200

## 2022-10-28 MED ORDER — SODIUM CHLORIDE 0.9 % IV SOLN
INTRAVENOUS | Status: DC | PRN
  Filled 2022-10-28: qty 250

## 2022-10-28 NOTE — Progress Notes (Signed)
Cardiology Office Note:    Date:  10/29/2022   ID:  Paul Ana [name], DOB 16-Jan-1946, MRN [id number]  PCP:  Wilford Corner, PA-C  [facility] Cardiologist:  Debbe Odea, MD  [facility] Electrophysiologist:  None   Referring MD: Wilford Corner,*   Chief Complaint: 3 month follow-up  History of Present Illness:    Paul Ana [name] is a 77 y.o. male with a hx of HFmrEF, NICM, LVH, HTN, nonobstructive CAD, diabetes, CLL s/p chemo, CKD stage 3, IDA who presents for follow-up.    The patient was initially seen 05/2022 for HTN and he was started on Diovan 160mg  daily. EKG showed LBBB and an echo was ordered.    Echo showed LVEF 45-50%, no WMA, moderate to severe LVH with speckled appearance, consider for amyloidosis, severely dilated left atrium, mildly dilated right atrium, small pericardial effusion with no tamponade, mild MR. Plan was for cardiac CTA and cardiac MRI.  Cardiac CTA showed coronary calcium score of 14.3, 12th percentile for age and sex matched control, minimal proximal LAD stenosis, overall minimal nonobstructive CAD.   Patient was admitted August 26, 2022 with a mechanical fall fall, cellulitis of the left groin/SIRS, and mildly elevated troponin. He was briefly seen by cardiology and felt to be stable. No inpatient cardiac work-up was pursued.   The patient was last seen 09/17/22 for pre-op for laparoscopic colectomy 09/28/22. He felt weak since being discharged from the hospital, but was overall stable from a cardiac perspective. He was encouraged to get the cardiac MRI done.   Today, the patient reports he is doing OK. He is a month post-op. BP is high, said he had his medications this AM. He denies chest pain or SOB. No lower leg edema, orthopnea, pnd. Balance is off a little. BP at home is high as well. He can go back to work after 6-7 weeks post-op. He has not had cMRI yet.   Past Medical History:  Diagnosis Date   Adenoma of left  adrenal gland    Aortic atherosclerosis (HCC)    BPH (benign prostatic hyperplasia)    Bradycardia    CAD (coronary artery disease) 08/26/2022   a.) cCTA 08/26/2022: Ca2+ 14.3 (12th %'ile for age/sex.race match control); (<25%) pLAD   Cellulitis of left thigh 08/2022   CHF (congestive heart failure) (HCC)    a.) TTE 10/23/2015: EF 60-65%, mod-sev LVH, mild biatrial dil, degen MV disease, AoV sclerosis, asc Ao 38 mm, G1DD; b.) TTE 08/05/2022: EF 45-50%, mod-sev LVH (speckled pattern), sev biatrial dil, mild MR, AoV sclerosis, G1DD   Chronic pain syndrome    a.) on COT managed by pain management   Chronic, continuous use of opioids    a.) chronic pain syndrome/chronic back pain; managed by pain management   CKD (chronic kidney disease) stage 3, GFR 30-59 ml/min (HCC)    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   a.) Rai stage III   DDD (degenerative disc disease), cervical    Difficult airway 10/22/2015   a.) 1st attempt with glidescope  --> macroglossic (unsuccessful); b.) 2nd attempt with Mac 4 and direct laryngoscopy with a 8.0 ETT --> tube too large to pass through the cords; b.) 3rd attempt (successful) with a Mac 4 and a 7.5 tube   Diverticulosis    DM (diabetes mellitus), type 2 (HCC)    Dysplasia of prostate    Erectile dysfunction    Frequent falls    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   History of MRSA infection 08/28/2022   a.) MRSA PCR (+) 08/28/2022; culture from LEFT thigh abscess   HTN (hypertension)    Hyperlipidemia    Hypogonadism in male    Hypothyroidism    IDA (iron deficiency anemia)    LBBB (left bundle branch block)    Long-term use of aspirin therapy    Lumbar spinal stenosis    Mild cardiomegaly    Nodule of right  lung    Orchitis and epididymitis 06/16/2013   OSA on CPAP    Palindromic rheumatism, hand    Pericardial effusion    Prostatitis    Sepsis (HCC) 08/2022   Subdural hematoma (HCC) 10/22/2015   a.) s/p traumatic mechanical fall --> CT head 10/22/2015: high-density SDH the left cerebral convexity with13 mm midline shift --> s/p LEFT frontal burr hole craniotomy   Tubular adenoma of colon    Ventral hernia     Past Surgical History:  Procedure Laterality Date   BACK SURGERY     BIOPSY  08/21/2022   Procedure: BIOPSY;  Surgeon: Jaynie Collins, DO;  Location: [facility] ENDOSCOPY;  Service: Gastroenterology;;   BIOPSY  08/23/2022   Procedure: BIOPSY;  Surgeon: Jaynie Collins, DO;  Location: [facility] ENDOSCOPY;  Service: Gastroenterology;;   Guss Bunde OF CRANIUM Left 10/22/2015   COLONOSCOPY WITH PROPOFOL N/A 08/23/2022   Procedure: COLONOSCOPY WITH PROPOFOL;  Surgeon: Jaynie Collins, DO;  Location: [facility] ENDOSCOPY;  Service: Gastroenterology;  Laterality: N/A;   ESOPHAGOGASTRODUODENOSCOPY (EGD) WITH PROPOFOL N/A 08/21/2022   Procedure: ESOPHAGOGASTRODUODENOSCOPY (EGD) WITH PROPOFOL;  Surgeon: Jaynie Collins, DO;  Location: [facility] ENDOSCOPY;  Service: Gastroenterology;  Laterality: N/A;   GIVENS CAPSULE STUDY N/A 08/23/2022   Procedure: GIVENS CAPSULE STUDY;  Surgeon: Jaynie Collins, DO;  Location[facility] ENDOSCOPY;  Service: Gastroenterology;  Laterality: N/A;   IRRIGATION AND DEBRIDEMENT ABSCESS Left 08/28/2022   Procedure: IRRIGATION AND DEBRIDEMENT ABSCESS LEFT UPPER THIGH/GROIN;  Surgeon: Leafy Ro, MD;  Location: [facility] ORS;  Service: General;  Laterality: Left;   LAPAROSCOPIC RIGHT COLECTOMY Right 09/28/2022   Procedure: LAPAROSCOPIC RIGHT COLECTOMY, RNFA to assist;  Surgeon: Leafy Ro, MD;  Location: [facility] ORS;  Service: General;  Laterality: Right;   POLYPECTOMY  08/23/2022   Procedure: POLYPECTOMY;  Surgeon: Jaynie Collins, DO;  Location: [facility]  ENDOSCOPY;  Service: Gastroenterology;;   SUBMUCOSAL TATTOO INJECTION  08/23/2022   Procedure: SUBMUCOSAL TATTOO INJECTION;  Surgeon: Jaynie Collins, DO;  Location: [facility] ENDOSCOPY;  Service: Gastroenterology;;   TONSILLECTOMY     VENTRAL HERNIA REPAIR N/A 09/28/2022   Procedure: HERNIA REPAIR VENTRAL ADULT;  Surgeon: Leafy Ro, MD;  Location: [facility] ORS;  Service: General;  Laterality: N/A;    Current Medications: Current Meds  Medication Sig   aspirin EC 81 MG tablet Take 1 tablet (81 mg total) by mouth daily. Swallow whole.   cyanocobalamin (VITAMIN B12) 1000 MCG tablet Take 1 tablet (1,000 mcg total) by mouth daily.   dapagliflozin propanediol (FARXIGA) 10 MG TABS tablet Take 1 tablet (10 mg total) by mouth daily before breakfast.   KLOR-CON M20 20 MEQ tablet Take 20 mEq by mouth at bedtime.   levothyroxine (SYNTHROID) 200 MCG tablet Take 200 mcg by mouth daily before breakfast.   metFORMIN (GLUCOPHAGE) 500 MG  tablet Take 500 mg by mouth 2 (two) times daily with a meal.   methocarbamol (ROBAXIN) 500 MG tablet Take 1 tablet (500 mg total) by mouth every 8 (eight) hours as needed for muscle spasms.   metoprolol succinate (TOPROL XL) 25 MG 24 hr tablet Take 1 tablet (25 mg total) by mouth daily. (Patient taking differently: Take 25 mg by mouth every morning.)   ondansetron (ZOFRAN-ODT) 4 MG disintegrating tablet Take 1 tablet (4 mg total) by mouth every 6 (six) hours as needed for nausea.   oxyCODONE-acetaminophen (PERCOCET) 10-325 MG tablet Take 1 tablet by mouth every 8 (eight) hours as needed.   rosuvastatin (CRESTOR) 40 MG tablet Take 1 tablet (40 mg total) by mouth daily. (Patient taking differently: Take 40 mg by mouth every morning.)   sacubitril-valsartan (ENTRESTO) 24-26 MG Take 1 tablet by mouth 2 (two) times daily.   tamsulosin (FLOMAX) 0.4 MG CAPS capsule Take 0.4 mg by mouth daily.   [DISCONTINUED] hydrALAZINE (APRESOLINE) 50 MG tablet Take 50 mg by mouth 2 (two) times  daily.     Allergies:   Patient has no known allergies.   Social History   Socioeconomic History   Marital status: Married    Spouse name: Not on file   Number of children: Not on file   Years of education: Not on file   Highest education level: 5th grade  Occupational History   Not on file  Tobacco Use   Smoking status: Former    Types: Cigarettes    Passive exposure: Past   Smokeless tobacco: Never   Tobacco comments:    quit 30 years ago  Vaping Use   Vaping status: Never Used  Substance and Sexual Activity   Alcohol use: No    Alcohol/week: 0.0 standard drinks of alcohol   Drug use: No   Sexual activity: Not Currently  Other Topics Concern   Not on file  Social History Narrative   Not on file   Social Determinants of Health   Financial Resource Strain: Medium Risk (10/21/2022)   Received [facility]   Overall Financial Resource Strain (CARDIA)    Difficulty of Paying Living Expenses: Somewhat hard  Food Insecurity: Food Insecurity Present (10/21/2022)   Received [facility]   Hunger Vital Sign    Worried About Running Out of Food in the Last Year: Sometimes true    Ran Out of Food in the Last Year: Sometimes true  Transportation Needs: Unmet Transportation Needs (10/21/2022)   Received from [facility]   PRAPARE - Transportation    In the past 12 months, has lack of transportation kept you from medical appointments or from getting medications?: No    Lack of Transportation (Non-Medical): Yes  Physical Activity: Unknown (07/26/2022)   Exercise Vital Sign    Days of Exercise per Week: Patient declined    Minutes of Exercise per Session: 0 min  Stress: Stress Concern Present (07/26/2022)   [employer] of Occupational Health - Occupational Stress Questionnaire    Feeling of Stress : To some extent  Social Connections: Moderately Integrated (07/26/2022)   Social Connection and Isolation Panel  [NHANES]    Frequency of Communication with Friends and Family: More than three times a week    Frequency of Social Gatherings with Friends and Family: Patient declined    Attends Religious Services: More than 4 times per year    Active Member of Clubs or Organizations: No  Attends Banker Meetings: Never    Marital Status: Married     Family History: The patient's family history includes Cancer in his maternal aunt. There is no history of Prostate cancer, Kidney disease, Kidney cancer, or Bladder Cancer.  ROS:   Please see the history of present illness.     All other systems reviewed and are negative.  EKGs/Labs/Other Studies Reviewed:    The following studies were reviewed today:   Cardiac CTA 08/2022  IMPRESSION: 1. Coronary calcium score of 14.3. This was 12th percentile for age and sex matched control.   2. Normal coronary origin with right dominance.   3. Minimal proximal LAD stenosis (<25%).   4. CAD-RADS 1. Minimal non-obstructive CAD (0-24%). Consider preventive therapy and risk factor modification.   Electronically Signed: By: Debbe Odea M.D. On: 08/26/2022 13:58     Echo 07/2022 1. Left ventricular ejection fraction, by estimation, is 45 to 50%. Left  ventricular ejection fraction by 3D volume is 47 %. The left ventricle has  mildly decreased function. The left ventricle has no regional wall motion  abnormalities. There is  moderate to severe concentric left ventricular hypertrophy, speckled  appearance. Left ventricular diastolic parameters are consistent with  Grade I diastolic dysfunction (impaired relaxation). The average left  ventricular global longitudinal strain is  -18.8 %. Consider w/u for [facility] if clinically indicated.   2. Right ventricular systolic function is normal. The right ventricular  size is normal. Tricuspid regurgitation signal is inadequate for assessing  PA pressure.   3. Left atrial size was severely  dilated.   4. Right atrial size was mildly dilated.   5. A small pericardial effusion is present. There is no evidence of  cardiac tamponade.   6. The mitral valve is normal in structure. Mild mitral valve  regurgitation. No evidence of mitral stenosis.   7. The aortic valve is tricuspid. Aortic valve regurgitation is not  visualized. Aortic valve sclerosis/calcification is present, without any  evidence of aortic stenosis.   8. The inferior vena cava is normal in size with greater than 50%  respiratory variability, suggesting right atrial pressure of 3 mmHg.     EKG:  EKG is not ordered today.   Recent Labs: 10/06/2022: Magnesium 2.1 10/18/2022: ALT 14 10/26/2022: BUN 15; Creatinine, Ser 1.11; Hemoglobin 9.4; Platelets 230; Potassium 4.1; Sodium 139  Recent Lipid Panel    Component Value Date/Time   CHOL 181 07/06/2021 1030   TRIG 60 07/06/2021 1030   HDL 61 07/06/2021 1030   CHOLHDL 3.0 07/06/2021 1030   LDLCALC 105 (H) 07/06/2021 1030    Physical Exam:    VS:  BP (!) 165/79 (BP Location: Left Arm, Patient Position: Sitting, Cuff Size: Normal)   Pulse 60   Ht 6\' 1"  (1.854 m)   Wt 224 lb (101.6 kg)   SpO2 98%   BMI 29.55 kg/m     Wt Readings from Last 3 Encounters:  10/29/22 224 lb (101.6 kg)  10/18/22 215 lb 3.2 oz (97.6 kg)  10/18/22 216 lb 1.6 oz (98 kg)     GEN:  Well nourished, well developed in no acute distress HEENT: Normal NECK: No JVD; No carotid bruits LYMPHATICS: No lymphadenopathy CARDIAC: RRR, no murmurs, rubs, gallops RESPIRATORY:  Clear to auscultation without rales, wheezing or rhonchi  ABDOMEN: Soft, non-tender, non-distended MUSCULOSKELETAL:  No edema; No deformity  SKIN: Warm and dry NEUROLOGIC:  Alert and oriented x 3 PSYCHIATRIC:  Normal affect   ASSESSMENT:  1. Medication management   2. Heart failure with mildly reduced ejection fraction (HFmrEF) (HCC)   3. NICM (nonischemic cardiomyopathy) (HCC)   4. LVH (left ventricular  hypertrophy)   5. Essential hypertension   6. Coronary artery disease involving native coronary artery of native heart without angina pectoris   7. Hyperlipidemia, mixed    PLAN:    In order of problems listed above:  HFmrEF NICM Mod to severe LVH Echo 07/2022 showed LVEF 45-50% with speckled appearance (consider amyloid), moderate to severe LVH, G1DD. Cardiac CTA showed minimal nonobstructive CAD. Cardiac MRI has been ordered, but not performed yet. Patient is euvolemic on exam today. Continue Toprol, Entresto, Farxiga, and Hydralazine. Can continue GDTM at follow-up. I encouraged the patient to undergo cMRI.   HTN BP is high today. I will increase Hydralazine to 100mg  BID. Continue Toprol 25mg  daily and Entresto 24-26mg  BID.   Nonobstructive CAD The patient denies anginal symptoms.  Prior cardiac CTA showed minimal nonobstructive CAD.  Continue Aspirin, Crestor and BB therapy.   HLD LDL 110. Recheck direct LDL and LFTs today. Continue Crestor 40mg  daily   Disposition: Follow up in 4 month(s) with MD/APP    Signed, Shanese Riemenschneider David Stall, PA-C  10/29/2022 10:04 AM    [facility]

## 2022-10-28 NOTE — Progress Notes (Signed)
Patient declined to wait the 30 minutes for post iron infusion observation today. Tolerated infusion well. VSS. 

## 2022-10-29 ENCOUNTER — Ambulatory Visit: Payer: No Typology Code available for payment source | Attending: Medical | Admitting: Medical

## 2022-10-29 ENCOUNTER — Encounter: Payer: Self-pay | Admitting: Medical

## 2022-10-29 VITALS — BP 165/79 | HR 60 | Ht 73.0 in | Wt 224.0 lb

## 2022-10-29 DIAGNOSIS — I10 Essential (primary) hypertension: Secondary | ICD-10-CM

## 2022-10-29 DIAGNOSIS — I517 Cardiomegaly: Secondary | ICD-10-CM

## 2022-10-29 DIAGNOSIS — I428 Other cardiomyopathies: Secondary | ICD-10-CM | POA: Diagnosis not present

## 2022-10-29 DIAGNOSIS — Z79899 Other long term (current) drug therapy: Secondary | ICD-10-CM

## 2022-10-29 DIAGNOSIS — I5022 Chronic systolic (congestive) heart failure: Secondary | ICD-10-CM | POA: Diagnosis not present

## 2022-10-29 DIAGNOSIS — E782 Mixed hyperlipidemia: Secondary | ICD-10-CM

## 2022-10-29 DIAGNOSIS — I2510 Atherosclerotic heart disease of native coronary artery without angina pectoris: Secondary | ICD-10-CM | POA: Diagnosis not present

## 2022-10-29 MED ORDER — HYDRALAZINE HCL 50 MG PO TABS: 100.0000 mg | ORAL_TABLET | Freq: Two times a day (BID) | ORAL | 3 refills | Status: DC

## 2022-10-29 NOTE — Patient Instructions (Signed)
Medication Instructions:  Increase Hydralazine to 100 MG twice daily.   *If you need a refill on your cardiac medications before your next appointment, please call your pharmacy*   Lab Work: Your provider would like for you to have following labs drawn today Direst LDL and LFT.   If you have labs (blood work) drawn today and your tests are completely normal, you will receive your results only by: MyChart Message (if you have MyChart) OR A paper copy in the mail If you have any lab test that is abnormal or we need to change your treatment, we will call you to review the results.   Testing/Procedures: None ordered.    Follow-Up: At [facility], you and your health needs are our priority.  As part of our continuing mission to provide you with exceptional heart care, we have created designated Provider Care Teams.  These Care Teams include your primary Cardiologist (physician) and Advanced Practice Providers (APPs -  Physician Assistants and Nurse Practitioners) who all work together to provide you with the care you need, when you need it.  We recommend signing up for the patient portal called "MyChart".  Sign up information is provided on this After Visit Summary.  MyChart is used to connect with patients for Virtual Visits (Telemedicine).  Patients are able to view lab/test results, encounter notes, upcoming appointments, etc.  Non-urgent messages can be sent to your provider as well.   To learn more about what you can do with MyChart, go to [email or web address].    Your next appointment:   4 month(s)  Provider:   You may see Debbe Odea, MD or one of the following Advanced Practice Providers on your designated Care Team:   Nicolasa Ducking, NP Eula Listen, PA-C Cadence Fransico Michael, PA-C Charlsie Quest, NP

## 2022-10-30 LAB — HEPATIC FUNCTION PANEL
ALT: 14 [IU]/L (ref 0–44)
AST: 30 [IU]/L (ref 0–40)
Albumin: 3.7 g/dL — ABNORMAL LOW (ref 3.8–4.8)
Alkaline Phosphatase: 85 [IU]/L (ref 44–121)
Bilirubin Total: 0.3 mg/dL (ref 0.0–1.2)
Bilirubin, Direct: <0.1 mg/dL (ref 0.00–0.40)
Total Protein: 6.1 g/dL (ref 6.0–8.5)

## 2022-10-30 LAB — LDL CHOLESTEROL, DIRECT: LDL Direct: 84 mg/dL (ref 0–99)

## 2022-11-01 ENCOUNTER — Ambulatory Visit: Payer: Self-pay | Admitting: *Deleted

## 2022-11-01 NOTE — Patient Outreach (Signed)
Care Coordination   Initial Visit Note   11/02/2022 Name: Paul Donald [name] MRN: [id number] DOB: Oct 07, 1945  Paul Ana [name] is a 77 y.o. year old male who sees Harlon Flor, Jonnie Finner, PA-C for primary care. I spoke with  Paul Ana [name] by phone today.  What matters to the patients health and wellness today?  Patient admitted to hospital on 9/3 for hernia repair and resection of colon mass.  Presented to ED on 10/1 for bleeding from wound, was evaluated by surgeon and discharged back home with [facility].  He is doing daily dressing changes, will follow up with cancer  center this week and will call surgeon for follow up.      Goals Addressed             This Visit's Progress    Recover from hospitalization       Interventions Today    Flowsheet Row Most Recent Value  Chronic Disease   Chronic disease during today's visit Other, Hypertension (HTN)  [colon mass resection and hernia repair, CLL]  General Interventions   General Interventions Discussed/Reviewed General Interventions Reviewed, Doctor Visits, Community Resources  [has [facility] for nursing and PT]  Doctor Visits Discussed/Reviewed Doctor Visits Reviewed, PCP, Specialist  [upcoming cancer center 10/10, weekly scheduled iron infusions]  PCP/Specialist Visits Compliance with follow-up visit  Education Interventions   Education Provided Provided Education  Provided Verbal Education On Nutrition, Medication, When to see the doctor, [facility], Other  [active with [facility], but he is doing his own dressing changes.  Still has some bleeding at surgical site, will monitor.]              SDOH assessments and interventions completed:  Yes  SDOH Interventions Today    Flowsheet Row Most Recent Value  SDOH Interventions   Food Insecurity Interventions Intervention Not Indicated  Housing Interventions Intervention Not Indicated  Transportation Interventions Intervention Not Indicated        Care  Coordination Interventions:  Yes, provided   Follow up plan: Follow up call scheduled for 10/18    Encounter Outcome:  Patient Visit Completed   Kemper Durie, RN, MSN, [facility] Care Management Care Management Coordinator [phone]

## 2022-11-04 ENCOUNTER — Inpatient Hospital Stay: Payer: No Typology Code available for payment source

## 2022-11-08 ENCOUNTER — Encounter: Payer: Self-pay | Admitting: Surgery

## 2022-11-08 ENCOUNTER — Ambulatory Visit (INDEPENDENT_AMBULATORY_CARE_PROVIDER_SITE_OTHER): Payer: No Typology Code available for payment source | Admitting: Surgery

## 2022-11-08 VITALS — BP 166/69 | HR 54 | Temp 98.0°F | Ht 73.0 in | Wt 217.0 lb

## 2022-11-08 DIAGNOSIS — N529 Male erectile dysfunction, unspecified: Secondary | ICD-10-CM

## 2022-11-08 DIAGNOSIS — K4091 Unilateral inguinal hernia, without obstruction or gangrene, recurrent: Secondary | ICD-10-CM

## 2022-11-08 NOTE — Patient Instructions (Signed)
We will send a referral to Urology. They will call you to schedule this appointment.  We will have you follow up here in 2 months.     Please call and ask to speak with a nurse if you develop questions or concerns.

## 2022-11-08 NOTE — Progress Notes (Signed)
Outpatient Surgical Follow Up  11/08/2022  Paul Ana [name] is an 77 y.o. male.   CC right groin pain   HPI: Paul Briggs is 6 weeks out following right colectomy.   Have a recent episode of bleeding from the left groin from hypergranulation tissue.  No fevers no chills.  He is eating well.  He did have a CT scan that I personally reviewed showing no evidence of leak abscess or complications.  CT there is evidence of a right inguinal hernia. Ambulating taking Po, + BM Complains of right inguinal bulge and intermittent pain that is dull and moderate worsening with  Valsalva.  He feels that his intestines are going into the right groin. He states that he does not remember whether or not he had inguinal hernia as a child.  No evidence of adult inguinal hernia repairs however on exam he does have a right inguinal scar.  Past Medical History:  Diagnosis Date   Adenoma of left adrenal gland    Aortic atherosclerosis (HCC)    BPH (benign prostatic hyperplasia)    Bradycardia    CAD (coronary artery disease) 08/26/2022   a.) cCTA 08/26/2022: Ca2+ 14.3 (12th %'ile for age/sex.race match control); (<25%) pLAD   Cellulitis of left thigh 08/2022   CHF (congestive heart failure) (HCC)    a.) TTE 10/23/2015: EF 60-65%, mod-sev LVH, mild biatrial dil, degen MV disease, AoV sclerosis, asc Ao 38 mm, G1DD; b.) TTE 08/05/2022: EF 45-50%, mod-sev LVH (speckled pattern), sev biatrial dil, mild MR, AoV sclerosis, G1DD   Chronic pain syndrome    a.) on COT managed by pain management   Chronic, continuous use of opioids    a.) chronic pain syndrome/chronic back pain; managed by pain management   CKD (chronic kidney disease) stage 3, GFR 30-59 ml/min (HCC)    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   a.) Rai stage III   DDD (degenerative disc disease), cervical    Difficult airway 10/22/2015   a.) 1st attempt with glidescope  --> macroglossic (unsuccessful); b.) 2nd attempt with Mac 4 and  direct laryngoscopy with a 8.0 ETT --> tube too large to pass through the cords; b.) 3rd attempt (successful) with a Mac 4 and a 7.5 tube   Diverticulosis    DM (diabetes mellitus), type 2 (HCC)    Dysplasia of prostate    Erectile dysfunction    Frequent falls    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   History of MRSA infection 08/28/2022   a.) MRSA PCR (+) 08/28/2022; culture from LEFT thigh abscess   HTN (hypertension)    Hyperlipidemia    Hypogonadism in male    Hypothyroidism    IDA (iron deficiency anemia)    LBBB (left bundle branch block)    Long-term use of aspirin therapy    Lumbar spinal stenosis    Mild cardiomegaly    Nodule of right lung    Orchitis and epididymitis 06/16/2013   OSA on CPAP    Palindromic rheumatism, hand    Pericardial effusion    Prostatitis    Sepsis (HCC) 08/2022   Subdural hematoma (HCC) 10/22/2015   a.) s/p traumatic mechanical fall --> CT head 10/22/2015: high-density SDH the left cerebral convexity with13 mm midline shift --> s/p LEFT frontal burr  hole craniotomy   Tubular adenoma of colon    Ventral hernia     Past Surgical History:  Procedure Laterality Date   BACK SURGERY     BIOPSY  08/21/2022   Procedure: BIOPSY;  Surgeon: Jaynie Collins, DO;  Location: [facility] ENDOSCOPY;  Service: Gastroenterology;;   BIOPSY  08/23/2022   Procedure: BIOPSY;  Surgeon: Jaynie Collins, DO;  Location: [facility] ENDOSCOPY;  Service: Gastroenterology;;   Guss Bunde OF CRANIUM Left 10/22/2015   COLONOSCOPY WITH PROPOFOL N/A 08/23/2022   Procedure: COLONOSCOPY WITH PROPOFOL;  Surgeon: Jaynie Collins, DO;  Location: [facility] ENDOSCOPY;  Service: Gastroenterology;  Laterality: N/A;   ESOPHAGOGASTRODUODENOSCOPY (EGD) WITH  PROPOFOL N/A 08/21/2022   Procedure: ESOPHAGOGASTRODUODENOSCOPY (EGD) WITH PROPOFOL;  Surgeon: Jaynie Collins, DO;  Location: [facility] ENDOSCOPY;  Service: Gastroenterology;  Laterality: N/A;   GIVENS CAPSULE STUDY N/A 08/23/2022   Procedure: GIVENS CAPSULE STUDY;  Surgeon: Jaynie Collins, DO;  Location: [facility] ENDOSCOPY;  Service: Gastroenterology;  Laterality: N/A;   IRRIGATION AND DEBRIDEMENT ABSCESS Left 08/28/2022   Procedure: IRRIGATION AND DEBRIDEMENT ABSCESS LEFT UPPER THIGH/GROIN;  Surgeon: Leafy Ro, MD;  Location: [facility] ORS;  Service: General;  Laterality: Left;   LAPAROSCOPIC RIGHT COLECTOMY Right 09/28/2022   Procedure: LAPAROSCOPIC RIGHT COLECTOMY, RNFA to assist;  Surgeon: Leafy Ro, MD;  Location: [facility] ORS;  Service: General;  Laterality: Right;   POLYPECTOMY  08/23/2022   Procedure: POLYPECTOMY;  Surgeon: Jaynie Collins, DO;  Location: [facility] ENDOSCOPY;  Service: Gastroenterology;;   SUBMUCOSAL TATTOO INJECTION  08/23/2022   Procedure: SUBMUCOSAL TATTOO INJECTION;  Surgeon: Jaynie Collins, DO;  Location: [facility] ENDOSCOPY;  Service: Gastroenterology;;   TONSILLECTOMY     VENTRAL HERNIA REPAIR N/A 09/28/2022   Procedure: HERNIA REPAIR VENTRAL ADULT;  Surgeon: Leafy Ro, MD;  Location: [facility] ORS;  Service: General;  Laterality: N/A;    Family History  Problem Relation Age of Onset   Cancer Maternal Aunt    Prostate cancer Neg Hx    Kidney disease Neg Hx    Kidney cancer Neg Hx    Bladder Cancer Neg Hx     Social History:  reports that he has quit smoking. His smoking use included cigarettes. He has been exposed to tobacco smoke. He has never used smokeless tobacco. He reports that he does not drink alcohol and does not use drugs.  Allergies: No Known Allergies  Medications reviewed.    ROS Full ROS performed and is otherwise negative other than what is stated in HPI   Wt 217 lb (98.4 kg)   BMI 28.63 kg/m   Physical Exam Vitals and  nursing note reviewed. Exam conducted with a chaperone present.  Constitutional:      General: He is not in acute distress.    Appearance: Normal appearance. He is normal weight. He is not ill-appearing.  Cardiovascular:     Rate and Rhythm: Normal rate and regular rhythm.  Pulmonary:     Effort: Pulmonary effort is normal.     Breath sounds: Normal breath sounds.  Abdominal:     General: Abdomen is flat. There is no distension.     Palpations: Abdomen is soft. There is no mass.     Tenderness: There is no abdominal tenderness. There is no guarding or rebound.     Hernia: No hernia is present.     Comments: Midline incision healing well, no evidence of infection or hernia recurrence . Right Inguinal region w tender but reducible  RIH, there is also a sacr indicative of prior surgery  Genitourinary:    Penis: Normal.   Musculoskeletal:        General: No swelling. Normal range of motion.     Cervical back: Normal range of motion and neck supple. No rigidity.     Comments: Left inner thigh hypergranulation tissue from prior I/D no evidence of active infection or necrotizing infection. I used silver nitrate to form an eschar  Skin:    General: Skin is warm and dry.     Capillary Refill: Capillary refill takes less than 2 seconds.  Neurological:     General: No focal deficit present.     Mental Status: He is alert and oriented to person, place, and time.  Psychiatric:        Mood and Affect: Mood normal.        Behavior: Behavior normal.        Thought Content: Thought content normal.        Judgment: Judgment normal.   Assessment/Plan: 77 year old male status post right colectomy for colon mass with high-grade dysplasia.  No evidence of complications.  He also had a prior I&D of the left groin that is healing well without complicating features. Comes in today with a new problem of right inguinal swelling and pain consistent with a recurrent inguinal hernia.  Given that he still has an  open wound on the left side and has not completely healed I do think it is prudent to wait couple months before definitive surgical intervention is done on the right side.  Given his recent prior abdominal operation I probably think that is wiser to do a right open approach with mesh.  Discussed with the patient in detail he understands.  Also concerned about rectal dysfunction and we will make appropriate arrangements for urology consultation. His note that this encounter was separate from a prior colectomy.  This encounter we mainly talked about the right inguinal hernia that has been recurrent and will need to be fixed at a later point  Please note that I spent 40 minutes in this encounter including personally reviewing imaging studies, coordinating his care, placing orders and performing documentation.  Paul Big, MD [employer] [facility] General Surgeon

## 2022-11-09 ENCOUNTER — Inpatient Hospital Stay: Payer: No Typology Code available for payment source

## 2022-11-09 VITALS — BP 160/85 | HR 59 | Resp 16

## 2022-11-09 DIAGNOSIS — C9110 Chronic lymphocytic leukemia of B-cell type not having achieved remission: Secondary | ICD-10-CM

## 2022-11-09 MED ORDER — SODIUM CHLORIDE 0.9 % IV SOLN
200.0000 mg | Freq: Once | INTRAVENOUS | Status: AC
  Administered 2022-11-09: 200 mg via INTRAVENOUS
  Filled 2022-11-09: qty 200

## 2022-11-09 MED ORDER — SODIUM CHLORIDE 0.9 % IV SOLN
Freq: Once | INTRAVENOUS | Status: AC
  Filled 2022-11-09: qty 250

## 2022-11-10 ENCOUNTER — Telehealth: Payer: Self-pay

## 2022-11-10 NOTE — Telephone Encounter (Signed)
Copied from CRM [id number]. Topic: General - Inquiry >> Nov 10, 2022  9:59 AM Lennox Pippins wrote: Patient called and states he had a missed call, he states he did not check his voicemail and he had to go, he was returning the missed call. Advised patient of his NP appt on 11.13.2024 with Karl Pock, no documentation if patient was called. Please advise. >> Nov 10, 2022 10:07 AM Lennox Pippins wrote: Saw Jennifer's CRM as I got off phone with patient, called patient back for rescheduling. Rescheduled patient for 12/22/22 with Merita Norton >> Nov 10, 2022 10:03 AM Lennox Pippins wrote: Jeanene Erb

## 2022-11-10 NOTE — Telephone Encounter (Signed)
Noted  

## 2022-11-11 ENCOUNTER — Other Ambulatory Visit: Payer: Self-pay

## 2022-11-11 ENCOUNTER — Inpatient Hospital Stay: Payer: No Typology Code available for payment source

## 2022-11-12 ENCOUNTER — Encounter: Payer: Self-pay | Admitting: Radiology

## 2022-11-12 ENCOUNTER — Emergency Department
Admission: EM | Admit: 2022-11-12 | Discharge: 2022-11-12 | Disposition: A | Discharge status: Home / Self Care | Payer: PRIVATE HEALTH INSURANCE | Attending: Emergency Medicine | Admitting: Emergency Medicine

## 2022-11-12 ENCOUNTER — Ambulatory Visit: Payer: Self-pay | Admitting: *Deleted

## 2022-11-12 ENCOUNTER — Emergency Department: Payer: PRIVATE HEALTH INSURANCE

## 2022-11-12 DIAGNOSIS — R1030 Lower abdominal pain, unspecified: Secondary | ICD-10-CM | POA: Diagnosis not present

## 2022-11-12 DIAGNOSIS — R110 Nausea: Secondary | ICD-10-CM | POA: Insufficient documentation

## 2022-11-12 DIAGNOSIS — R638 Other symptoms and signs concerning food and fluid intake: Secondary | ICD-10-CM | POA: Diagnosis not present

## 2022-11-12 DIAGNOSIS — R109 Unspecified abdominal pain: Secondary | ICD-10-CM | POA: Diagnosis present

## 2022-11-12 DIAGNOSIS — D3502 Benign neoplasm of left adrenal gland: Secondary | ICD-10-CM | POA: Diagnosis not present

## 2022-11-12 LAB — CBC
HCT: 30.9 % — ABNORMAL LOW (ref 39.0–52.0)
Hemoglobin: 9.7 g/dL — ABNORMAL LOW (ref 13.0–17.0)
MCH: 28.6 pg (ref 26.0–34.0)
MCHC: 31.4 g/dL (ref 30.0–36.0)
MCV: 91.2 fL (ref 80.0–100.0)
Platelets: 169 10*3/uL (ref 150–400)
RBC: 3.39 MIL/uL — ABNORMAL LOW (ref 4.22–5.81)
RDW: 20.9 % — ABNORMAL HIGH (ref 11.5–15.5)
WBC: 11.1 10*3/uL — ABNORMAL HIGH (ref 4.0–10.5)
nRBC: 0 % (ref 0.0–0.2)

## 2022-11-12 LAB — COMPREHENSIVE METABOLIC PANEL
ALT: 20 U/L (ref 0–44)
AST: 34 U/L (ref 15–41)
Albumin: 3.6 g/dL (ref 3.5–5.0)
Alkaline Phosphatase: 68 U/L (ref 38–126)
Anion gap: 12 (ref 5–15)
BUN: 14 mg/dL (ref 8–23)
CO2: 23 mmol/L (ref 22–32)
Calcium: 8.6 mg/dL — ABNORMAL LOW (ref 8.9–10.3)
Chloride: 105 mmol/L (ref 98–111)
Creatinine, Ser: 0.92 mg/dL (ref 0.61–1.24)
GFR, Estimated: >60 mL/min (ref >60)
Glucose, Bld: 124 mg/dL — ABNORMAL HIGH (ref 70–99)
Potassium: 3.4 mmol/L — ABNORMAL LOW (ref 3.5–5.1)
Sodium: 140 mmol/L (ref 135–145)
Total Bilirubin: 1.2 mg/dL (ref 0.3–1.2)
Total Protein: 6.5 g/dL (ref 6.5–8.1)

## 2022-11-12 LAB — URINALYSIS, ROUTINE W REFLEX MICROSCOPIC
Bacteria, UA: NONE SEEN
Bilirubin Urine: NEGATIVE
Glucose, UA: >=500 mg/dL — AB
Hgb urine dipstick: NEGATIVE
Ketones, ur: 5 mg/dL — AB
Leukocytes,Ua: NEGATIVE
Nitrite: NEGATIVE
Protein, ur: 30 mg/dL — AB
Specific Gravity, Urine: 1.021 (ref 1.005–1.030)
Squamous Epithelial / HPF: 0 /[HPF] (ref 0–5)
pH: 6 (ref 5.0–8.0)

## 2022-11-12 LAB — LACTIC ACID, PLASMA: Lactic Acid, Venous: 1 mmol/L (ref 0.5–1.9)

## 2022-11-12 LAB — LIPASE, BLOOD: Lipase: 35 U/L (ref 11–51)

## 2022-11-12 MED ORDER — PIPERACILLIN-TAZOBACTAM 3.375 G IVPB 30 MIN
3.3750 g | Freq: Once | INTRAVENOUS | Status: AC
  Administered 2022-11-12: 3.375 g via INTRAVENOUS
  Filled 2022-11-12: qty 50

## 2022-11-12 MED ORDER — AMOXICILLIN-POT CLAVULANATE 875-125 MG PO TABS: 1.0000 | ORAL_TABLET | Freq: Two times a day (BID) | ORAL | 0 refills | Status: AC

## 2022-11-12 MED ORDER — ONDANSETRON 4 MG PO TBDP: 4.0000 mg | ORAL_TABLET | Freq: Three times a day (TID) | ORAL | 0 refills | Status: DC | PRN

## 2022-11-12 MED ORDER — HYDROMORPHONE HCL 1 MG/ML IJ SOLN
1.0000 mg | Freq: Once | INTRAMUSCULAR | Status: AC
  Administered 2022-11-12: 1 mg via INTRAVENOUS
  Filled 2022-11-12: qty 1

## 2022-11-12 MED ORDER — IOHEXOL 300 MG/ML  SOLN
100.0000 mL | Freq: Once | INTRAMUSCULAR | Status: AC | PRN
  Administered 2022-11-12: 100 mL via INTRAVENOUS

## 2022-11-12 MED ORDER — OXYCODONE-ACETAMINOPHEN 5-325 MG PO TABS
1.0000 | ORAL_TABLET | Freq: Four times a day (QID) | ORAL | 0 refills | Status: DC | PRN
Start: 2022-11-12 — End: 2022-11-22

## 2022-11-12 MED ORDER — ONDANSETRON HCL 4 MG/2ML IJ SOLN
4.0000 mg | Freq: Once | INTRAMUSCULAR | Status: AC
  Administered 2022-11-12: 4 mg via INTRAVENOUS
  Filled 2022-11-12: qty 2

## 2022-11-12 NOTE — ED Provider Notes (Signed)
[facility] Provider Note    Event Date/Time   First MD Initiated Contact with Patient 11/12/22 [phone]     (approximate)   History   Abdominal Pain   HPI  Paul Mccrimon [name] is a 77 y.o. male status post recent colectomy about 6 weeks ago here with abdominal pain.  The patient states that over the last 2 days, he separatively worsening aching, throbbing, lower abdominal and rectal pain.  The pain is aching, severe.  It is worse with any kind of movement or palpation.  He has had associated nausea.  He said decreased appetite.  No fevers or chills.  No urinary symptoms.  No other complaints.     Physical Exam   Triage Vital Signs: ED Triage Vitals  Encounter Vitals Group     BP 11/12/22 0618 (!) 186/91     Systolic BP Percentile --      Diastolic BP Percentile --      Pulse Rate 11/12/22 0618 76     Resp 11/12/22 0618 18     Temp 11/12/22 0618 98.6 F (37 C)     Temp Source 11/12/22 0618 Oral     SpO2 11/12/22 0618 100 %     Weight --      Height --      Head Circumference --      Peak Flow --      Pain Score 11/12/22 0617 3     Pain Loc --      Pain Education --      Exclude from Growth Chart --     Most recent vital signs: Vitals:   11/12/22 0618 11/12/22 0930  BP: (!) 186/91 (!) 169/88  Pulse: 76 64  Resp: 18   Temp: 98.6 F (37 C)   SpO2: 100% 96%     General: Awake, no distress.  CV:  Good peripheral perfusion. RRR. Resp:  Normal work of breathing. Lungs clear to auscultation bilaterally. Abd:  No distention. Surgical sites c/d/I. Moderate suprapubic and lower abdominal tenderness. Slight guarding. Other:  No LE edema.    ED Results / Procedures / Treatments   Labs (all labs ordered are listed, but only abnormal results are displayed) Labs Reviewed  COMPREHENSIVE METABOLIC PANEL - Abnormal; Notable for the following components:      Result Value   Potassium 3.4 (*)    Glucose, Bld 124 (*)    Calcium 8.6 (*)     All other components within normal limits  CBC - Abnormal; Notable for the following components:   WBC 11.1 (*)    RBC 3.39 (*)    Hemoglobin 9.7 (*)    HCT 30.9 (*)    RDW 20.9 (*)    All other components within normal limits  URINALYSIS, ROUTINE W REFLEX MICROSCOPIC - Abnormal; Notable for the following components:   Color, Urine YELLOW (*)    APPearance CLEAR (*)    Glucose, UA >=500 (*)    Ketones, ur 5 (*)    Protein, ur 30 (*)    All other components within normal limits  CULTURE, BLOOD (SINGLE)  LIPASE, BLOOD  LACTIC ACID, PLASMA  LACTIC ACID, PLASMA     EKG    RADIOLOGY CT Abdo/pelvis: Recent right hemicolectomy, submucosal edema but no perforation or obstruction   I also independently reviewed and agree with radiologist interpretations.   PROCEDURES:  Critical Care performed: No   MEDICATIONS ORDERED IN ED: Medications  iohexol (OMNIPAQUE) 300  MG/ML solution 100 mL (100 mLs Intravenous Contrast Given 11/12/22 0742)  HYDROmorphone (DILAUDID) injection 1 mg (1 mg Intravenous Given 11/12/22 0912)  ondansetron (ZOFRAN) injection 4 mg (4 mg Intravenous Given 11/12/22 0911)  piperacillin-tazobactam (ZOSYN) IVPB 3.375 g (3.375 g Intravenous New Bag/Given 11/12/22 0914)     IMPRESSION / MDM / ASSESSMENT AND PLAN / ED COURSE  I reviewed the triage vital signs and the nursing notes.                              Differential diagnosis includes, but is not limited to, mild colitis, diverticulitis, obstruction, abscess, anastomotic breakdown  Patient's presentation is most consistent with acute presentation with potential threat to life or bodily function.  The patient is on the cardiac monitor to evaluate for evidence of arrhythmia and/or significant heart rate changes   77 year old male with history of right hemicolectomy 09/2022 here with lower abdominal pain.  Lab work shows mild leukocytosis, but normal lactic acid, no evidence to suggest severe sepsis.  CMP  shows normal renal function and LFTs.  Urinalysis shows mild dehydration.  Patient was given IV fluids and antibiotics.  CT scan obtained, reviewed, shows a possible small amount of submucosal edema at the anastomosis but no evidence of perforation or obstruction.  Stable ascites is noted.  Patient's pain is markedly improved.  Discussed case with Dr. Everlene Farrier, the patient surgeon.  Will place him on empiric antibiotics and refer him for outpatient follow-up.  No emergent indication for admission or surgical intervention at this time.  Return precautions given.   FINAL CLINICAL IMPRESSION(S) / ED DIAGNOSES   Final diagnoses:  Lower abdominal pain     Rx / DC Orders   ED Discharge Orders          Ordered    amoxicillin-clavulanate (AUGMENTIN) 875-125 MG tablet  2 times daily        11/12/22 1001    oxyCODONE-acetaminophen (PERCOCET) 5-325 MG tablet  Every 6 hours PRN        11/12/22 1001    ondansetron (ZOFRAN-ODT) 4 MG disintegrating tablet  Every 8 hours PRN        11/12/22 1001             Note:  This document was prepared using Dragon voice recognition software and may include unintentional dictation errors.   Shaune Pollack, MD 11/12/22 1001

## 2022-11-12 NOTE — Patient Outreach (Signed)
Care Coordination   Follow Up Visit Note   11/12/2022 Name: Paul Hecht [name] MRN: [id number] DOB: 05/14/1945  Paul Ana [name] is a 77 y.o. year old male who sees Harlon Flor, Jonnie Finner, PA-C for primary care. I spoke with  Paul Ana [name] by phone today.  What matters to the patients health and wellness today?  Was seen in the ED today for abdominal discomfort, provided with dose of IV antibiotics and given prescription for oral. State he is feeling much better.  Denies any urgent concerns, encouraged to contact this care manager with questions.    Goals Addressed             This Visit's Progress    COMPLETED: Recover from hospitalization   On track      Interventions Today    Flowsheet Row Most Recent Value  Chronic Disease   Chronic disease during today's visit Other, Hypertension (HTN)  [Colon mass recection, abd pain]  General Interventions   General Interventions Discussed/Reviewed General Interventions Reviewed, Doctor Visits  Doctor Visits Discussed/Reviewed PCP, Doctor Visits Reviewed, Specialist  [Upcoming, oncology 10/21, Pain clinic 10/22, urology 10/30, PCP 11/18, surgeon 12/2]  PCP/Specialist Visits Compliance with follow-up visit  Education Interventions   Provided Verbal Education On Medication, When to see the doctor, Other  [report picked up antibiotics today from pharmacy, continues with wound healing, state it is better]              SDOH assessments and interventions completed:  No     Care Coordination Interventions:  Yes, provided   Follow up plan: No further intervention required.   Encounter Outcome:  Patient Visit Completed   Kemper Durie RN, MSN, CCM [place]  [facility], [facility] RN Care Coordinator Direct Dial: [phone] / Main [phone] Fax [phone] Email: [email or web address] Website: [email or web address]

## 2022-11-12 NOTE — Discharge Instructions (Signed)
Take the antibiotics as prescribed  Take the pain medication and nausea meds as needed  Follow-up with Dr. Everlene Farrier

## 2022-11-12 NOTE — ED Notes (Signed)
Pt resting comfortably with eyes closed at this time. Respirations even and unlabored. NAD

## 2022-11-12 NOTE — ED Notes (Signed)
Patient transported to CT 

## 2022-11-12 NOTE — ED Triage Notes (Signed)
Pt presents ambulatory to triage via POV with complaints of generalized abdominal pain that started yesterday. Rates the pain 3/10. Recent colectomy 09/28/22. A&Ox4 at this time. Denies fevers, chills, N/V/D, CP or SOB.

## 2022-11-13 NOTE — Plan of Care (Signed)
[facility] Tonsillectomy/Adenoidectomy, Postoperative PEDS care plan entered in error.

## 2022-11-15 ENCOUNTER — Inpatient Hospital Stay: Payer: No Typology Code available for payment source

## 2022-11-15 ENCOUNTER — Inpatient Hospital Stay (HOSPITAL_BASED_OUTPATIENT_CLINIC_OR_DEPARTMENT_OTHER): Payer: No Typology Code available for payment source | Admitting: Oncology

## 2022-11-15 ENCOUNTER — Encounter: Payer: Self-pay | Admitting: Oncology

## 2022-11-15 VITALS — BP 172/87 | HR 58 | Resp 20

## 2022-11-15 VITALS — BP 159/91 | HR 65 | Temp 97.6°F | Resp 20 | Wt 217.9 lb

## 2022-11-15 DIAGNOSIS — Z5111 Encounter for antineoplastic chemotherapy: Secondary | ICD-10-CM

## 2022-11-15 DIAGNOSIS — D649 Anemia, unspecified: Secondary | ICD-10-CM

## 2022-11-15 DIAGNOSIS — N1831 Chronic kidney disease, stage 3a: Secondary | ICD-10-CM | POA: Diagnosis not present

## 2022-11-15 DIAGNOSIS — C9110 Chronic lymphocytic leukemia of B-cell type not having achieved remission: Secondary | ICD-10-CM

## 2022-11-15 DIAGNOSIS — D509 Iron deficiency anemia, unspecified: Secondary | ICD-10-CM

## 2022-11-15 LAB — CBC WITH DIFFERENTIAL (CANCER CENTER ONLY)
Abs Immature Granulocytes: 0.02 10*3/uL (ref 0.00–0.07)
Basophils Absolute: 0 10*3/uL (ref 0.0–0.1)
Basophils Relative: 1 %
Eosinophils Absolute: 0.2 10*3/uL (ref 0.0–0.5)
Eosinophils Relative: 3 %
HCT: 30.8 % — ABNORMAL LOW (ref 39.0–52.0)
Hemoglobin: 9.6 g/dL — ABNORMAL LOW (ref 13.0–17.0)
Immature Granulocytes: 0 %
Lymphocytes Relative: 43 %
Lymphs Abs: 2.4 10*3/uL (ref 0.7–4.0)
MCH: 28.5 pg (ref 26.0–34.0)
MCHC: 31.2 g/dL (ref 30.0–36.0)
MCV: 91.4 fL (ref 80.0–100.0)
Monocytes Absolute: 0.5 10*3/uL (ref 0.1–1.0)
Monocytes Relative: 9 %
Neutro Abs: 2.4 10*3/uL (ref 1.7–7.7)
Neutrophils Relative %: 44 %
Platelet Count: 184 10*3/uL (ref 150–400)
RBC: 3.37 MIL/uL — ABNORMAL LOW (ref 4.22–5.81)
RDW: 20.3 % — ABNORMAL HIGH (ref 11.5–15.5)
Smear Review: NORMAL
WBC Count: 5.5 10*3/uL (ref 4.0–10.5)
nRBC: 0 % (ref 0.0–0.2)

## 2022-11-15 LAB — CMP (CANCER CENTER ONLY)
ALT: 23 U/L (ref 0–44)
AST: 34 U/L (ref 15–41)
Albumin: 3.7 g/dL (ref 3.5–5.0)
Alkaline Phosphatase: 74 U/L (ref 38–126)
Anion gap: 10 (ref 5–15)
BUN: 13 mg/dL (ref 8–23)
CO2: 25 mmol/L (ref 22–32)
Calcium: 8.7 mg/dL — ABNORMAL LOW (ref 8.9–10.3)
Chloride: 105 mmol/L (ref 98–111)
Creatinine: 0.92 mg/dL (ref 0.61–1.24)
GFR, Estimated: >60 mL/min (ref >60)
Glucose, Bld: 95 mg/dL (ref 70–99)
Potassium: 3.6 mmol/L (ref 3.5–5.1)
Sodium: 140 mmol/L (ref 135–145)
Total Bilirubin: 1 mg/dL (ref 0.3–1.2)
Total Protein: 6.5 g/dL (ref 6.5–8.1)

## 2022-11-15 LAB — IRON AND TIBC
Iron: 33 ug/dL — ABNORMAL LOW (ref 45–182)
Saturation Ratios: 12 % — ABNORMAL LOW (ref 17.9–39.5)
TIBC: 276 ug/dL (ref 250–450)
UIBC: 243 ug/dL

## 2022-11-15 LAB — RETIC PANEL
Immature Retic Fract: 11.4 % (ref 2.3–15.9)
RBC.: 3.31 MIL/uL — ABNORMAL LOW (ref 4.22–5.81)
Retic Count, Absolute: 37.7 10*3/uL (ref 19.0–186.0)
Retic Ct Pct: 1.1 % (ref 0.4–3.1)
Reticulocyte Hemoglobin: 26.5 pg — ABNORMAL LOW (ref >27.9)

## 2022-11-15 LAB — VITAMIN B12: Vitamin B-12: 519 pg/mL (ref 180–914)

## 2022-11-15 LAB — FOLATE: Folate: 13.3 ng/mL (ref >5.9)

## 2022-11-15 LAB — FERRITIN: Ferritin: 124 ng/mL (ref 24–336)

## 2022-11-15 MED ORDER — SODIUM CHLORIDE 0.9 % IV SOLN
200.0000 mg | Freq: Once | INTRAVENOUS | Status: AC
  Administered 2022-11-15: 200 mg via INTRAVENOUS
  Filled 2022-11-15: qty 200

## 2022-11-15 MED ORDER — SODIUM CHLORIDE 0.9 % IV SOLN
INTRAVENOUS | Status: DC | PRN
  Filled 2022-11-15: qty 250

## 2022-11-15 NOTE — Assessment & Plan Note (Addendum)
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 Lab Results  Component Value Date   HGB 9.6 (L) 11/15/2022   TIBC 276 11/15/2022   IRONPCTSAT 12 (L) 11/15/2022   FERRITIN 124 11/15/2022    Hemoglobin has improved to his baseline. Recommend another dose of Venofer today to further increase iron level.

## 2022-11-15 NOTE — Assessment & Plan Note (Signed)
Encourage oral hydration and avoid nephrotoxins.   

## 2022-11-15 NOTE — Assessment & Plan Note (Addendum)
Labs are reviewed and discussed with patient. CT chest abdomen pelvis results reviewed and discussed with patient.   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 Anemia has improved Resume Zanubrutinib 160 mg twice daily

## 2022-11-15 NOTE — Progress Notes (Signed)
BP elevated. Discussed if he took his BP med. Patient stated he did. Patient stated he didn't sleep much last night. Discussed getting rest. Rechecked his BP but was the same. No symptoms. Patient didn't want to stay 30 mins.

## 2022-11-15 NOTE — Progress Notes (Signed)
Hematology/Oncology Progress note [phone] [phone]        ASSESSMENT & PLAN:   Cancer Staging  CLL (chronic lymphocytic leukemia) (HCC) Staging form: Chronic Lymphocytic Leukemia / Small Lymphocytic Lymphoma, AJCC 8th Edition - Clinical stage from 12/28/2021: Modified Rai Stage III (Modified Rai risk: High, Lymphocytosis: Present, Adenopathy: Present, Organomegaly: Absent, Anemia: Present, Thrombocytopenia: Absent) - Signed by Paul Patience, MD on 01/20/2022   CLL (chronic lymphocytic leukemia) [facility]) Labs are reviewed and discussed with patient. CT chest abdomen pelvis results reviewed and discussed with patient.   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 Anemia has improved Resume Zanubrutinib 160 mg twice daily    IDA (iron deficiency anemia) Anemia is likely multifactorial, could be due to CLL, anemia due to chronic kidney disease and IDA. Lab Results  Component Value Date   HGB 9.6 (L) 11/15/2022   TIBC 276 11/15/2022   IRONPCTSAT 12 (L) 11/15/2022   FERRITIN 124 11/15/2022    Hemoglobin has improved to his baseline. Recommend another dose of Venofer today to further increase iron level.   CKD (chronic kidney disease) stage 3, GFR 30-59 ml/min (HCC) Encourage oral hydration and avoid nephrotoxins.     Encounter for antineoplastic chemotherapy Continue treatment as mentioned above.   Orders Placed This Encounter  Procedures   CMP (Cancer Center only)    Standing Status:   Future    Standing Expiration Date:   11/15/2023   CBC with Differential (Cancer Center Only)    Standing Status:   Future    Standing Expiration Date:   11/15/2023   Iron and TIBC    Standing Status:   Future    Standing Expiration Date:   11/15/2023   Ferritin    Standing Status:   Future    Standing Expiration Date:   11/15/2023   Retic Panel    Standing Status:   Future     Standing Expiration Date:   11/15/2023   Follow up 4-5 weeks All questions were answered. The patient knows to call the clinic with any problems, questions or concerns.  Paul Patience, MD, PhD [facility] Hematology Oncology 11/15/2022    CHIEF COMPLAINTS/PURPOSE OF CONSULTATION:  CLL  HISTORY OF PRESENTING ILLNESS:  Paul Ana [name] 77 y.o. male presents for follow up of CLL I have reviewed his chart and materials related to his cancer extensively and collaborated history with the patient. Summary of oncologic history is as follows: Oncology History  CLL (chronic lymphocytic leukemia) (HCC)  12/25/2021 - 12/26/2021 Hospital Admission   Patient was admitted due to pain and swelling of right ankle, right knee and left wrist.  Patient was found to have WBC 30.4, with neutrophil 29% and lymphocyte 2%.  Peripheral smear showed leukocytosis with slight left shift in myeloid series and lymphocytosis with abnormal morphology.  Uric acid 5.5, worsening renal function with creatinine 1.35, BUN 18, GFR 55 (baseline creatinine 1.08 on 07/06/2021), lactic acid 1.1.  ESR normal, uric acid 5.5 normal, CRP 22.2, LDH 182  Orthopedic surgery was consulted s/p arthrocentesis, cell count 296 with 72% neutrophil, no crystal. Fluid negative for growth.  Patient received cefepime and vancomycin while in the hospital, Patient was discharged on Keflex for 5 days.   12/28/2021 Cancer Staging   Staging form: Chronic Lymphocytic Leukemia / Small Lymphocytic Lymphoma, AJCC 8th Edition - Clinical stage from 12/28/2021: Modified Rai Stage III (Modified Rai risk: High, Lymphocytosis: Present,  Adenopathy: Present, Organomegaly: Absent, Anemia: Present, Thrombocytopenia: Absent) - Signed by Paul Patience, MD on 01/20/2022 Stage prefix: Initial diagnosis Hemoglobin (Hgb) (g/dL): 40.9   81/01/9145 Initial Diagnosis   CLL (chronic lymphocytic leukemia)   12/27/21 peripheral blood flowcytometry showed Involvement by CD5+,  CD23+, CD20+, CD22+ clonal B cell population, phenotype typical for 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    01/05/2022 Imaging   CT chest abdomen pelvis wo contrast 1. Multiple prominent borderline enlarged and mildly enlarged lymph nodes, most evident in the low anatomic pelvis, as above, compatible with reported clinical history of CLL. 2. There also several small pulmonary nodules in the lungs measuring 5 mm or less in size. This is nonspecific, but statistically likely benign. No follow-up needed if patient is low-risk (and has no known or suspected primary neoplasm). Non-contrast chest CT can be considered in 12 months if patient is high-risk. This recommendation 3. Aortic atherosclerosis, in addition to left main and 2 vessel coronary artery disease. Please note that although the presence of coronary artery calcium documents the presence of coronary artery disease, the severity of this disease and any potential stenosis cannot be assessed on this non-gated CT examination. Assessment for  potential risk factor modification, dietary therapy or pharmacologic therapy may be warranted, if clinically indicated. 4. There are calcifications of the aortic valve. Echocardiographic correlation for evaluation of potential valvular dysfunction may be warranted if clinically indicated. 5. 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 6. Diverticulosis without evidence of acute diverticulitis at this time. 7. Mild cardiomegaly.   08/26/2022 Imaging   CT abdomen pelvis w contrast  1. Subcutaneous fat stranding within the left lower quadrant anterior abdominal wall extending into the anterior proximal left thigh, likely related to contusion given history of recent trauma. No fluid collection or hematoma. 2. No acute displaced fracture. 3.  Retroperitoneal and pelvic lymphadenopathy as above, with waxing and waning appearance. 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 4. 1.9 cm hypodensity within the medial aspect of the spleen,nonspecific. Leukemic involvement of the spleen cannot be excluded. 5. Distal colonic diverticulosis without diverticulitis. 6.  Aortic Atherosclerosis      INTERVAL HISTORY Welles Selig [name] is a 77 y.o. male who has above history reviewed by me today presents for follow up visit for CLL. Off Zanubrutinib 160 mg twice daily + dark stool, no abdominal pain + fatigue 08/23/2022 Colonoscopy showed - Likely malignant partially obstructing tumor in the distal ascending colon. Biopsied. Tattooed. - Three 4 to 6 mm polyps in the transverse colon and in the ascending colon, removed with a cold snare. Resected and retrieved. - Five 3 to 7 mm polyps in the rectum, in the descending colon, in the transverse colon and in the ascending colon, removed with a hot snare. Resected and retrieved. 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 - Non- bleeding internal hemorrhoids.  Pathology showed 1. Ascending Colon Polyp, x2 cold snare - TUBULAR ADENOMA(S) (MULTIPLE FRAGMENTS) - NEGATIVE FOR HIGH-GRADE DYSPLASIA OR MALIGNANCY 2. Ascending Colon Polyp, hot snare - TUBULAR ADENOMA (MULTIPLE FRAGMENTS) - NEGATIVE FOR HIGH-GRADE DYSPLASIA OR MALIGNANCY 3. Ascending Colon Biopsy, mass cbx - TUBULAR ADENOMA (MULTIPLE FRAGMENTS) - SEE NOTE 4. Transverse Colon Polyp, hot snare; cold snare - TUBULAR ADENOMA (MULTIPLE FRAGMENTS) - NEGATIVE FOR HIGH-GRADE DYSPLASIA OR MALIGNANCY 5. Descending Colon Polyp, x2 hot snare - TUBULAR ADENOMA(S) (MULTIPLE FRAGMENTS) -  NEGATIVE FOR HIGH-GRADE DYSPLASIA OR MALIGNANCY 6. Rectum, polyp(s), hot snare - TUBULOVILLOUS ADENOMA (MULTIPLE FRAGMENTS) - NEGATIVE FOR HIGH-GRADE DYSPLASIA OR MALIGNANCY I&D Recent admission due to sepsis due to cellulitis, left groin abscess, s/p  I&D   INTERVAL HISTORY Ishaan Stinger [name] is a 77 y.o. male who has above history reviewed by me today presents for follow up visit for CLL, iron deficiency anemia, colon tubular adenoma with high-grade dysplastic status post resection. Patient was feeling well today.  He has resumed working.   Denies any fatigue, abdominal pain, blood in his stool.  Denies night sweats or fever.   MEDICAL HISTORY:  Past Medical History:  Diagnosis Date   Adenoma of left adrenal gland    Aortic atherosclerosis (HCC)    BPH (benign prostatic hyperplasia)    Bradycardia    CAD (coronary artery disease) 08/26/2022   a.) cCTA 08/26/2022: Ca2+ 14.3 (12th %'ile for age/sex.race match control); (<25%) pLAD   Cellulitis of left thigh 08/2022   CHF (congestive heart failure) (HCC)    a.) TTE 10/23/2015: EF 60-65%, mod-sev LVH, mild biatrial dil, degen MV disease, AoV sclerosis, asc Ao 38 mm, G1DD; b.) TTE 08/05/2022: EF 45-50%, mod-sev LVH (speckled pattern), sev biatrial dil, mild MR, AoV sclerosis, G1DD   Chronic pain syndrome    a.) on COT managed by pain management   Chronic, continuous use of opioids    a.) chronic pain syndrome/chronic back pain; managed by pain management   CKD (chronic kidney disease) stage 3, GFR 30-59 ml/min (HCC)    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   a.) Rai stage III   DDD (degenerative disc disease), cervical    Difficult airway 10/22/2015   a.) 1st attempt with glidescope  --> macroglossic (unsuccessful); b.) 2nd attempt with Mac 4 and direct laryngoscopy with a 8.0 ETT --> tube too large to pass through the cords; b.) 3rd attempt (successful) with a Mac 4 and a 7.5 tube   Diverticulosis    DM (diabetes mellitus), type 2 (HCC)    Dysplasia of prostate    Erectile dysfunction    Frequent falls    Hepatic flexure mass 08/23/2022   a.) colonoscopy 08/23/2022: 14 x 15 mm partially obstructing ulcerated distal ascending colon mass; tissue friable -->  pathology resulted as tubular adenoma, however felt to be false (-) --> referral to sugery and colectomy recommended.   History of MRSA infection 08/28/2022   a.) MRSA PCR (+) 08/28/2022; culture from LEFT thigh abscess   HTN (hypertension)    Hyperlipidemia    Hypogonadism in male    Hypothyroidism    IDA (iron deficiency anemia)    LBBB (left bundle branch block)    Long-term use of aspirin therapy    Lumbar spinal stenosis    Mild cardiomegaly    Nodule of right lung    Orchitis and epididymitis 06/16/2013   OSA on CPAP    Palindromic rheumatism, hand    Pericardial effusion    Prostatitis    Sepsis (HCC) 08/2022   Subdural hematoma (HCC) 10/22/2015   a.) s/p traumatic mechanical fall --> CT head 10/22/2015: high-density SDH the left cerebral convexity with13 mm midline shift --> s/p LEFT frontal burr hole craniotomy   Tubular adenoma of colon    Ventral hernia     SURGICAL HISTORY: Past Surgical History:  Procedure Laterality Date   BACK SURGERY     BIOPSY  08/21/2022   Procedure: BIOPSY;  Surgeon: Jaynie Collins, DO;  Location: [facility] ENDOSCOPY;  Service: Gastroenterology;;   BIOPSY  08/23/2022   Procedure: BIOPSY;  Surgeon: Jaynie Collins, DO;  Location: [facility] ENDOSCOPY;  Service: Gastroenterology;;   Guss Bunde OF CRANIUM Left 10/22/2015   COLONOSCOPY WITH PROPOFOL N/A 08/23/2022   Procedure: COLONOSCOPY WITH PROPOFOL;  Surgeon: Jaynie Collins, DO;  Location: [facility] ENDOSCOPY;  Service: Gastroenterology;  Laterality: N/A;   ESOPHAGOGASTRODUODENOSCOPY (EGD) WITH PROPOFOL N/A 08/21/2022   Procedure: ESOPHAGOGASTRODUODENOSCOPY (EGD) WITH PROPOFOL;  Surgeon: Jaynie Collins, DO;  Location: [facility] ENDOSCOPY;  Service: Gastroenterology;  Laterality: N/A;   GIVENS CAPSULE STUDY N/A 08/23/2022   Procedure: GIVENS CAPSULE STUDY;  Surgeon: Jaynie Collins, DO;  Location: [facility] ENDOSCOPY;  Service: Gastroenterology;  Laterality: N/A;   IRRIGATION AND  DEBRIDEMENT ABSCESS Left 08/28/2022   Procedure: IRRIGATION AND DEBRIDEMENT ABSCESS LEFT UPPER THIGH/GROIN;  Surgeon: Leafy Ro, MD;  Location: [facility] ORS;  Service: General;  Laterality: Left;   LAPAROSCOPIC RIGHT COLECTOMY Right 09/28/2022   Procedure: LAPAROSCOPIC RIGHT COLECTOMY, RNFA to assist;  Surgeon: Leafy Ro, MD;  Location: [facility] ORS;  Service: General;  Laterality: Right;   POLYPECTOMY  08/23/2022   Procedure: POLYPECTOMY;  Surgeon: Jaynie Collins, DO;  Location: [facility] ENDOSCOPY;  Service: Gastroenterology;;   SUBMUCOSAL TATTOO INJECTION  08/23/2022   Procedure: SUBMUCOSAL TATTOO INJECTION;  Surgeon: Jaynie Collins, DO;  Location: [facility] ENDOSCOPY;  Service: Gastroenterology;;   TONSILLECTOMY     VENTRAL HERNIA REPAIR N/A 09/28/2022   Procedure: HERNIA REPAIR VENTRAL ADULT;  Surgeon: Leafy Ro, MD;  Location: [facility] ORS;  Service: General;  Laterality: N/A;    SOCIAL HISTORY: Social History   Socioeconomic History   Marital status: Married    Spouse name: Not on file   Number of children: Not on file   Years of education: Not on file   Highest education level: 5th grade  Occupational History   Not on file  Tobacco Use   Smoking status: Former    Types: Cigarettes    Passive exposure: Past   Smokeless tobacco: Never   Tobacco comments:    quit 30 years ago  Vaping Use   Vaping status: Never Used  Substance and Sexual Activity   Alcohol use: No    Alcohol/week: 0.0 standard drinks of alcohol   Drug use: No   Sexual activity: Not Currently  Other Topics Concern   Not on file  Social History Narrative   Not on file   Social Determinants of Health   Financial Resource Strain: Medium Risk (10/21/2022)   Received from [facility]   Overall Financial Resource Strain (CARDIA)    Difficulty of Paying Living Expenses: Somewhat hard  Food Insecurity: No Food Insecurity (11/01/2022)   Hunger Vital Sign    Worried About Running Out of  Food in the Last Year: Never true    Ran Out of Food in the Last Year: Never true  Recent Concern: Food Insecurity - Food Insecurity Present (10/21/2022)   Received from [facility]   Hunger Vital Sign    Worried About Running Out of Food in the Last Year: Sometimes true    Ran Out of Food in the Last Year: Sometimes true  Transportation Needs: No Transportation Needs (11/01/2022)   PRAPARE - Administrator, Civil Service (Medical): No    Lack of Transportation (Non-Medical): No  Recent Concern: Transportation Needs - Unmet Transportation Needs (10/21/2022)   Received from [facility]  PRAPARE - Transportation    In the past 12 months, has lack of transportation kept you from medical appointments or from getting medications?: No    Lack of Transportation (Non-Medical): Yes  Physical Activity: Unknown (07/26/2022)   Exercise Vital Sign    Days of Exercise per Week: Patient declined    Minutes of Exercise per Session: 0 min  Stress: Stress Concern Present (07/26/2022)   [employer] of Occupational Health - Occupational Stress Questionnaire    Feeling of Stress : To some extent  Social Connections: Moderately Integrated (07/26/2022)   Social Connection and Isolation Panel [NHANES]    Frequency of Communication with Friends and Family: More than three times a week    Frequency of Social Gatherings with Friends and Family: Patient declined    Attends Religious Services: More than 4 times per year    Active Member of [employer] or Organizations: No    Attends Banker Meetings: Never    Marital Status: Married  Catering manager Violence: Not At Risk (09/28/2022)   Humiliation, Afraid, Rape, and Kick questionnaire    Fear of Current or Ex-Partner: No    Emotionally Abused: No    Physically Abused: No    Sexually Abused: No    FAMILY HISTORY: Family History  Problem Relation Age of Onset   Cancer Maternal Aunt    Prostate cancer Neg  Hx    Kidney disease Neg Hx    Kidney cancer Neg Hx    Bladder Cancer Neg Hx     ALLERGIES:  has No Known Allergies.  MEDICATIONS:  Current Outpatient Medications  Medication Sig Dispense Refill   amoxicillin-clavulanate (AUGMENTIN) 875-125 MG tablet Take 1 tablet by mouth 2 (two) times daily for 7 days. 14 tablet 0   aspirin EC 81 MG tablet Take 1 tablet (81 mg total) by mouth daily. Swallow whole.     cyanocobalamin (VITAMIN B12) 1000 MCG tablet Take 1 tablet (1,000 mcg total) by mouth daily. 90 tablet 0   dapagliflozin propanediol (FARXIGA) 10 MG TABS tablet Take 1 tablet (10 mg total) by mouth daily before breakfast. 90 tablet 3   hydrALAZINE (APRESOLINE) 50 MG tablet Take 2 tablets (100 mg total) by mouth 2 (two) times daily. 180 tablet 3   KLOR-CON M20 20 MEQ tablet Take 20 mEq by mouth at bedtime.     levothyroxine (SYNTHROID) 200 MCG tablet Take 200 mcg by mouth daily before breakfast.     metFORMIN (GLUCOPHAGE) 500 MG tablet Take 500 mg by mouth 2 (two) times daily with a meal.     methocarbamol (ROBAXIN) 500 MG tablet Take 1 tablet (500 mg total) by mouth every 8 (eight) hours as needed for muscle spasms. 30 tablet 0   metoprolol succinate (TOPROL XL) 25 MG 24 hr tablet Take 1 tablet (25 mg total) by mouth daily. (Patient taking differently: Take 25 mg by mouth every morning.) 90 tablet 3   ondansetron (ZOFRAN-ODT) 4 MG disintegrating tablet Take 1 tablet (4 mg total) by mouth every 8 (eight) hours as needed for nausea or vomiting. 20 tablet 0   oxyCODONE-acetaminophen (PERCOCET) 5-325 MG tablet Take 1-2 tablets by mouth every 6 (six) hours as needed for severe pain (pain score 7-10) or moderate pain (pain score 4-6) (no more than 6 tabs daily). 12 tablet 0   pregabalin (LYRICA) 50 MG capsule Take 1 capsule (50 mg total) by mouth 3 (three) times daily for 14 days. 42 capsule 0   rosuvastatin (CRESTOR) 40  MG tablet Take 1 tablet (40 mg total) by mouth daily. (Patient taking  differently: Take 40 mg by mouth every morning.) 90 tablet 3   sacubitril-valsartan (ENTRESTO) 24-26 MG Take 1 tablet by mouth 2 (two) times daily. 180 tablet 3   tamsulosin (FLOMAX) 0.4 MG CAPS capsule Take 0.4 mg by mouth daily.     No current facility-administered medications for this visit.    Review of Systems  Constitutional:  Positive for fatigue. Negative for appetite change, chills, fever and unexpected weight change.  HENT:   Negative for hearing loss and voice change.   Eyes:  Negative for eye problems and icterus.  Respiratory:  Negative for chest tightness, cough and shortness of breath.   Cardiovascular:  Negative for chest pain and leg swelling.  Gastrointestinal:  Negative for abdominal distention and abdominal pain.  Endocrine: Negative for hot flashes.  Genitourinary:  Negative for difficulty urinating, dysuria and frequency.   Musculoskeletal:  Positive for arthralgias.  Skin:  Negative for itching and rash.  Neurological:  Negative for light-headedness and numbness.  Hematological:  Negative for adenopathy. Does not bruise/bleed easily.  Psychiatric/Behavioral:  Negative for confusion.      PHYSICAL EXAMINATION: ECOG PERFORMANCE STATUS: 1 - Symptomatic but completely ambulatory  Vitals:   11/15/22 1311  BP: (!) 159/91  Pulse: 65  Resp: 20  Temp: 97.6 F (36.4 C)  SpO2: 100%   Filed Weights   11/15/22 1311  Weight: 217 lb 14.4 oz (98.8 kg)    Physical Exam Constitutional:      General: He is not in acute distress.    Appearance: He is obese. He is not diaphoretic.  HENT:     Head: Normocephalic and atraumatic.  Eyes:     General: No scleral icterus.    Pupils: Pupils are equal, round, and reactive to light.  Cardiovascular:     Rate and Rhythm: Normal rate.     Heart sounds: No murmur heard. Pulmonary:     Effort: Pulmonary effort is normal. No respiratory distress.     Breath sounds: No wheezing.  Abdominal:     General: There is no  distension.     Palpations: Abdomen is soft.     Tenderness: There is no abdominal tenderness.  Musculoskeletal:     Cervical back: Normal range of motion and neck supple.  Skin:    General: Skin is warm and dry.     Findings: No erythema.  Neurological:     Mental Status: He is alert and oriented to person, place, and time. Mental status is at baseline.     Cranial Nerves: No cranial nerve deficit.     Motor: No abnormal muscle tone.  Psychiatric:        Mood and Affect: Mood and affect normal.      LABORATORY DATA:  I have reviewed the data as listed    Latest Ref Rng & Units 11/15/2022   12:55 PM 11/12/2022    6:33 AM 10/26/2022   11:56 AM  CBC  WBC 4.0 - 10.5 K/uL 5.5  11.1  7.4   Hemoglobin 13.0 - 17.0 g/dL 9.6  9.7  9.4   Hematocrit 39.0 - 52.0 % 30.8  30.9  31.6   Platelets 150 - 400 K/uL 184  169  230       Latest Ref Rng & Units 11/15/2022   12:55 PM 11/12/2022    6:33 AM 10/29/2022    9:50 AM  CMP  Glucose 70 -  99 mg/dL 95  782    BUN 8 - 23 mg/dL 13  14    Creatinine 9.56 - 1.24 mg/dL 2.13  0.86    Sodium 578 - 145 mmol/L 140  140    Potassium 3.5 - 5.1 mmol/L 3.6  3.4    Chloride 98 - 111 mmol/L 105  105    CO2 22 - 32 mmol/L 25  23    Calcium 8.9 - 10.3 mg/dL 8.7  8.6    Total Protein 6.5 - 8.1 g/dL 6.5  6.5  6.1   Total Bilirubin 0.3 - 1.2 mg/dL 1.0  1.2  0.3   Alkaline Phos 38 - 126 U/L 74  68  85   AST 15 - 41 U/L 34  34  30   ALT 0 - 44 U/L 23  20  14       RADIOGRAPHIC STUDIES: I have personally reviewed the radiological images as listed and agreed with the findings in the report. CT ABDOMEN PELVIS W CONTRAST  Result Date: 11/12/2022 CLINICAL DATA:  Six weeks postop from right colectomy. Generalized abdominal pain that started yesterday. EXAM: CT ABDOMEN AND PELVIS WITH CONTRAST TECHNIQUE: Multidetector CT imaging of the abdomen and pelvis was performed using the standard protocol following bolus administration of intravenous contrast. RADIATION  DOSE REDUCTION: This exam was performed according to the departmental dose-optimization program which includes automated exposure control, adjustment of the mA and/or kV according to patient size and/or use of iterative reconstruction technique. CONTRAST:  OMNIPAQUE IOHEXOL 300 MG/ML  SOLN COMPARISON:  10/11/2022 FINDINGS: Lower chest: Cardiac enlargement and small low-density pericardial effusion. Left pleural effusion has resolved. No acute finding Hepatobiliary: No focal liver abnormality.No evidence of biliary obstruction or stone. Pancreas: 1 cm cystic density in the uncinate process of the pancreas, simple appearing. Two year follow-up recommendation on recent abdominal CT report. Spleen: 15 mm lesion in the upper spleen, usually incidental in isolation and given stability since 01/04/2022. Would expect progression if this were related to patient's leukemia. Adrenals/Urinary Tract: Known left adrenal adenoma measuring 2 cm. Multiple cystic densities in the bilateral kidney, the largest being on the right at 3.7 cm. No hydronephrosis or stone. Unremarkable bladder. Stomach/Bowel: Right hemicolectomy. Mild low-density thickening at the anastomosis suggesting edema, possibly accentuated by incomplete distension. No ulceration or perforation detected. No bowel obstruction or ileus. Stool throughout much of the descending colon. Colonic diverticulosis. Vascular/Lymphatic: Generalized atheromatous calcification of the aorta and iliacs. No mass or adenopathy. Reproductive:No pathologic findings. Other: Small volume ascites distributed throughout the abdomen is stable, no encapsulation/abscess. There is some reticulation of the greater omentum, nonspecific after recent surgery. Musculoskeletal: No acute finding. Generalized lumbar spine degeneration with multilevel mild listhesis. IMPRESSION: 1. Recent right hemicolectomy. Some submucosal edema at the anastomosis but no perforation or obstruction. 2. Small  volume ascites is stable. Decrease in left pleural effusion. 3. Chronic findings remain stable from prior and are described above. Electronically Signed   By: Tiburcio Pea M.D.   On: 11/12/2022 08:12

## 2022-11-15 NOTE — Assessment & Plan Note (Signed)
Continue treatment as mentioned above.

## 2022-11-16 ENCOUNTER — Other Ambulatory Visit: Payer: Self-pay

## 2022-11-16 ENCOUNTER — Encounter: Payer: PRIVATE HEALTH INSURANCE | Admitting: Student in an Organized Health Care Education/Training Program

## 2022-11-17 ENCOUNTER — Other Ambulatory Visit: Payer: Self-pay

## 2022-11-17 ENCOUNTER — Telehealth: Payer: Self-pay | Admitting: *Deleted

## 2022-11-17 ENCOUNTER — Telehealth: Payer: Self-pay | Admitting: Student in an Organized Health Care Education/Training Program

## 2022-11-17 LAB — CULTURE, BLOOD (SINGLE): Culture: NO GROWTH

## 2022-11-17 NOTE — Telephone Encounter (Signed)
Daughter sent mychart message on this as well. Message relayed that Dr. Cathie Hoops is unable to order since it is being managed by pain clinic.

## 2022-11-17 NOTE — Telephone Encounter (Signed)
Reached out to daughter and she stated she had gotten mychart message from Dr Bethanne Ginger nurse.  Daughter is very concerned and upset that pain clinic has stated that Dr Cherylann Ratel is not in clinic and patient would not be able to be seen until his appointment on 12/07/22.

## 2022-11-17 NOTE — Telephone Encounter (Signed)
Pt daughter Rebeca Alert called and stated that she was unable to take her father to appointment with Dr Cherylann Ratel yesterday at pain management clinic and patient does not have any more pain medication left. Pt is having pain and the daughter called Dr Cherylann Ratel office who instructed her to call the cancer center. Pt was seen 11/15/22 and is to return on 12/20/22. Please advise.

## 2022-11-17 NOTE — Telephone Encounter (Addendum)
Patient called this morning due to missed med mgmt appt. He says he was hurting so bad from intestine resection done in Sept and new hernia that he is waiting to have surgery on the forgot his appt. States he is out of meds. I gave him appt for Nov 12 at 8am. He wants to speak with a nurse.  I looked in chart and the last time Dr Cherylann Ratel prescribed anything was July.

## 2022-11-17 NOTE — Telephone Encounter (Signed)
Dr Laban Emperor, this patients daughter has been calling and is very  unhappy .  Her father missed his appt yesterday and she states it washer fault because she is a high Engineer, site and was unable to bring him.  I sent Dr Cherylann Ratel a message and he didn't answer me.  The patient knows that Dr Cherylann Ratel is out of the office for a week and would like to see another physician.  Would you look at his notes and consider seeing him?  She feels like we have not been very nice in our phone conversations with her.  I told her I would ask you and get back to her in the morning.  Please let me know what to do.  She asked for the head of the department, but it was me she had to talk to.  Thank you!!!

## 2022-11-17 NOTE — Telephone Encounter (Signed)
Called daughter Rebeca Alert back to explain that I had spoke with Dr Cathie Hoops and Elouise Munroe NP and unfortunately since her father is being seen and pain medication is managed by the pain clinic that they would need to prescribe pain medication for patient.  Daughter verbalized understanding and was going to call pain management clinic again to try and get her father an appointment.

## 2022-11-18 NOTE — Telephone Encounter (Signed)
Yes, I am just asking to address the current issue that is going on.  I will inform the daughter that you will see him just to cover Dr Cherylann Ratel but you will not be changing the plan of care.  Thank you.

## 2022-11-18 NOTE — Telephone Encounter (Signed)
Patient scheduled to see Dr Laban Emperor on Monday 11-22-22.  Daughter notified.

## 2022-11-18 NOTE — Progress Notes (Signed)
PROVIDER NOTE: Information contained herein reflects review and annotations entered in association with encounter. Interpretation of such information and data should be left to medically-trained personnel. Information provided to patient can be located elsewhere in the medical record under "Patient Instructions". Document created using STT-dictation technology, any transcriptional errors that may result from process are unintentional.    Patient: Paul Ana [name]  Service Category: E/M  Provider: Oswaldo Done, MD  DOB: 1945-10-26  DOS: 11/22/2022  Referring Provider: Wilford Corner  MRN: [id number]  Specialty: Interventional Pain Management  PCP: Debera Lat, PA-C  Type: Established Patient  Setting: Ambulatory outpatient    Location: Office  Delivery: Face-to-face     HPI  [name], a 77 y.o. year old male, is here today because of his Chronic pain syndrome [G89.4]. Mr. Virola primary complain today is Back Pain (lower) and Shoulder Pain (Left and right, unable to lift arms , hands will get numb)  Pertinent problems: Mr. Mirro has Peripheral neuropathy; Chronic back pain; Subdural hematoma (HCC); Headache above the eye region; Disorder of rotator cuff, left; Primary osteoarthritis of left shoulder; Left rotator cuff tear arthropathy; Spinal stenosis, lumbar region, with neurogenic claudication; Chronic left shoulder pain; Chronic pain syndrome; Chronic radicular lumbar pain; Lumbar facet arthropathy; Lumbar degenerative disc disease; Right ankle pain; Right knee pain; Left wrist pain; Cellulitis of right lower extremity; Septic infrapatellar bursitis of right knee; Primary osteoarthritis of right shoulder; Chronic right shoulder pain; Cervical radicular pain; Contusion of groin, left, initial encounter; Cellulitis of left groin; Abscess of left groin; and Abscess of muscle of thigh on their pertinent problem list. Pain Assessment: Severity of  Chronic pain is reported as a 10-Worst pain ever/10. Location: Back Lower/hips/buttocks bilateral down back of legs to bottom of feet. Onset: More than a month ago. Quality: Aching, Constant, Throbbing, Discomfort, Sore. Timing: Constant. Modifying factor(s): medications. Vitals:  height is 6\' 1"  (1.854 m) and weight is 221 lb (100.2 kg). His temperature is 97.2 F (36.2 C) (abnormal). His blood pressure is 178/92 (abnormal) and his pulse is 62. His respiration is 16 and oxygen saturation is 99%.  BMI: Estimated body mass index is 29.16 kg/m as calculated from the following:   Height as of this encounter: 6\' 1"  (1.854 m).   Weight as of this encounter: 221 lb (100.2 kg). Last encounter: 08/19/2022.  (Dr. Edward Jolly) Last procedure: 06/16/2022.  (Dr. Roselie Skinner Lateef-left cervical ESI #1) (numbness in the left hand improved, right hand did not)  Reason for encounter:  This is a patient of Dr. Edward Jolly who comes in while Dr. Cherylann Ratel is on PTO .  I am currently covering for Dr. Cherylann Ratel and the patient has requested to come in for evaluation.  The patient indicates doing well with the current medication regimen. No adverse reactions or side effects reported to the medications.  The patient indicates having missed his medication management appointment secondary to one of the surgeries listed below.  Recent surgeries: (08/21/2022) endoscopy secondary to melena. (08/23/2022) colonoscopy. (08/28/2022) incision and drainage of deep, complex, thigh abscess. (09/10/2022) C3-5 ACDF by Dr. Marcell Barlow "canceled". (09/28/2022) Hand assisted Laparoscopic Right Hemicolectomy with stapled ileocolostomy & repair incarcerated umbilical hernia.  PMP: Oxycodone/APAP 10/325 (# 90) (last filled on 10/25/2022 from set of prescriptions written on 08/19/2022) (prescription 3 of 3).  Last UDS done on 12/01/2021.  RTCB: 12/22/2022   Pharmacotherapy Assessment  Analgesic: Oxycodone/APAP 10/325, 1 tab p.o. 3 times daily  (30 mg/day of oxycodone) (#  90/month) (MME of 45 mg/day).    Monitoring: [place] PMP: PDMP reviewed during this encounter.       Pharmacotherapy: No side-effects or adverse reactions reported. Compliance: No problems identified. Effectiveness: Clinically acceptable.  Newman Pies, RN  11/22/2022 11:00 AM  Sign when Signing Visit Nursing Pain Medication Assessment:  Safety precautions to be maintained throughout the outpatient stay will include: orient to surroundings, keep bed in low position, maintain call bell within reach at all times, provide assistance with transfer out of bed and ambulation.  Medication Inspection Compliance: Pill count conducted under aseptic conditions, in front of the patient. Neither the pills nor the bottle was removed from the patient's sight at any time. Once count was completed pills were immediately returned to the patient in their original bottle.  Medication: Oxycodone IR Pill/Patch Count:  0 of 90 pills remain Pill/Patch Appearance: Markings consistent with prescribed medication Bottle Appearance: Standard pharmacy container. Clearly labeled. Filled Date: 07 / 31 / 2024 Last Medication intake:  Today    No results found for: "CBDTHCR" No results found for: "D8THCCBX" No results found for: "D9THCCBX"  UDS:  Summary  Date Value Ref Range Status  12/01/2021 Note  Final    Comment:    ==================================================================== [employer] 13 (MW) ==================================================================== Test                             Result       Flag       Units  Drug Present and Declared for Prescription Verification   Oxycodone                      998          EXPECTED   ng/mg creat   Oxymorphone                    739          EXPECTED   ng/mg creat   Noroxymorphone                 322          EXPECTED   ng/mg creat    Sources of oxycodone include scheduled prescription medications.    Oxymorphone  and noroxymorphone are expected metabolites of oxycodone.    Oxymorphone is also available as a scheduled prescription    medication.  ==================================================================== Test                      Result    Flag   Units      Ref Range   Creatinine              220              mg/dL      >=16 ==================================================================== Declared Medications:  The flagging and interpretation on this report are based on the  following declared medications.  Unexpected results may arise from  inaccuracies in the declared medications.   **Note: The testing scope of this panel includes these medications:   Oxycodone (Percocet)   **Note: The testing scope of this panel does not include the  following reported medications:   Acetaminophen (Percocet)  Aspirin  Bisacodyl  Caffeine  Hydrochlorothiazide  Levothyroxine  Rosuvastatin  Supplement  Tamsulosin  Valsartan ==================================================================== For clinical consultation, please call [phone]. ====================================================================       ROS  Constitutional: Denies  any fever or chills Gastrointestinal: No reported hemesis, hematochezia, vomiting, or acute GI distress Musculoskeletal: Denies any acute onset joint swelling, redness, loss of ROM, or weakness Neurological: No reported episodes of acute onset apraxia, aphasia, dysarthria, agnosia, amnesia, paralysis, loss of coordination, or loss of consciousness  Medication Review  aspirin EC, cyanocobalamin, dapagliflozin propanediol, hydrALAZINE, levothyroxine, metFORMIN, methocarbamol, metoprolol succinate, ondansetron, oxyCODONE-acetaminophen, potassium chloride SA, pregabalin, rosuvastatin, sacubitril-valsartan, and tamsulosin  History Review  Allergy: Mr. Olivos has No Known Allergies. Drug: Mr. Gorzynski  reports no history of drug  use. Alcohol:  reports no history of alcohol use. Tobacco:  reports that he has quit smoking. His smoking use included cigarettes. He has been exposed to tobacco smoke. He has never used smokeless tobacco. Social: Mr. Hayre  reports that he has quit smoking. His smoking use included cigarettes. He has been exposed to tobacco smoke. He has never used smokeless tobacco. He reports that he does not drink alcohol and does not use drugs. 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), DDD (degenerative disc disease), cervical, Difficult airway (10/22/2015), Diverticulosis, DM (diabetes mellitus), type 2 (HCC), Dysplasia of prostate, Erectile dysfunction, Frequent falls, 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 Surgical: Mr. 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 Family: family history includes Cancer in his maternal aunt.  Laboratory Chemistry Profile   Renal Lab Results  Component Value Date   BUN 13 11/15/2022   CREATININE 0.92 11/15/2022   BCR NOT APPLICABLE 07/06/2021   GFRAA >60 10/22/2015   GFRNONAA >60 11/15/2022    Hepatic Lab Results  Component Value Date   AST 34 11/15/2022   ALT 23 11/15/2022   ALBUMIN 3.7 11/15/2022   ALKPHOS 74 11/15/2022   HCVAB NON REACTIVE 12/26/2021   LIPASE 35 11/12/2022    Electrolytes Lab Results  Component Value Date   NA 140 11/15/2022   K 3.6 11/15/2022   CL 105 11/15/2022   CALCIUM 8.7 (L) 11/15/2022   MG 2.1 10/06/2022   PHOS 2.7 10/06/2022    Bone Lab Results  Component Value Date   VD25OH 30.27 05/21/2022   TESTOSTERONE 429 04/19/2017    Inflammation (CRP: Acute Phase) (ESR: Chronic Phase) Lab Results  Component Value Date   CRP 22.2 (H) 12/25/2021   ESRSEDRATE 3 12/25/2021   LATICACIDVEN 1.0 11/12/2022         Note: Above Lab results reviewed.  Recent Imaging Review  CT ABDOMEN PELVIS W CONTRAST CLINICAL DATA:  Six weeks postop from right colectomy. Generalized abdominal pain that started  yesterday.  EXAM: CT ABDOMEN AND PELVIS WITH CONTRAST  TECHNIQUE: Multidetector CT imaging of the abdomen and pelvis was performed using the standard protocol following bolus administration of intravenous contrast.  RADIATION DOSE REDUCTION: This exam was performed according to the departmental dose-optimization program which includes automated exposure control, adjustment of the mA and/or kV according to patient size and/or use of iterative reconstruction technique.  CONTRAST:  OMNIPAQUE IOHEXOL 300 MG/ML  SOLN  COMPARISON:   10/11/2022  FINDINGS: Lower chest: Cardiac enlargement and small low-density pericardial effusion. Left pleural effusion has resolved. No acute finding  Hepatobiliary: No focal liver abnormality.No evidence of biliary obstruction or stone.  Pancreas: 1 cm cystic density in the uncinate process of the pancreas, simple appearing. Two year follow-up recommendation on recent abdominal CT report.  Spleen: 15 mm lesion in the upper spleen, usually incidental in isolation and given stability since 01/04/2022. Would expect progression if this were related to patient's leukemia.  Adrenals/Urinary Tract: Known left adrenal adenoma measuring 2 cm. Multiple cystic densities in the bilateral kidney, the largest being on the right at 3.7 cm. No hydronephrosis or stone. Unremarkable bladder.  Stomach/Bowel: Right hemicolectomy. Mild low-density thickening at the anastomosis suggesting edema, possibly accentuated by incomplete distension. No ulceration or perforation detected. No bowel obstruction or ileus. Stool throughout much of the descending colon. Colonic diverticulosis.  Vascular/Lymphatic: Generalized atheromatous calcification of the aorta and iliacs. No mass or adenopathy.  Reproductive:No pathologic findings.  Other: Small volume ascites distributed throughout the abdomen is stable, no encapsulation/abscess. There is some reticulation of the greater omentum, nonspecific after recent surgery.  Musculoskeletal: No acute finding. Generalized lumbar spine degeneration with multilevel mild listhesis.  IMPRESSION: 1. Recent right hemicolectomy. Some submucosal edema at the anastomosis but no perforation or obstruction. 2. Small volume ascites is stable. Decrease in left pleural effusion. 3. Chronic findings remain stable from prior and are described above.  Electronically Signed   By: Tiburcio Pea M.D.   On: 11/12/2022 08:12 Note: Reviewed        Physical Exam  General  appearance: Well nourished, well developed, and well hydrated. In no apparent acute distress Mental status: Alert, oriented x 3 (person, place, & time)       Respiratory: No evidence of acute respiratory distress Eyes: PERLA Vitals: BP (!) 178/92 Comment (BP Location): left upper arm  Pulse 62   Temp (!) 97.2 F (36.2 C)   Resp 16   Ht 6\' 1"  (1.854 m)   Wt 221 lb (100.2 kg)   SpO2 99%   BMI 29.16 kg/m  BMI: Estimated body mass index is 29.16 kg/m as calculated from the following:   Height as of this encounter: 6\' 1"  (1.854 m).   Weight as of this encounter: 221 lb (100.2 kg). Ideal: Ideal body weight: 79.9 kg (176 lb 2.4 oz) Adjusted ideal body weight: 88 kg (194 lb 1.4 oz)  Assessment   Diagnosis Status  1. Chronic pain syndrome   2. Cervical radicular pain   3. Cervicalgia   4. Lumbar radicular pain   5. Spinal stenosis, lumbar region, with neurogenic claudication   6. Chronic radicular lumbar pain   7. Pharmacologic therapy   8. Chronic use of opiate for therapeutic purpose   9. Encounter for medication management   10. Encounter for chronic pain management    Controlled Controlled Controlled   Updated Problems: Problem  Chronic Right Shoulder Pain  Chronic Radicular Lumbar Pain  Chronic Left Shoulder Pain  Chronic  Pain Syndrome  Chronic Back Pain    Plan of Care  Problem-specific:  No problem-specific Assessment & Plan notes found for this encounter.  Mr. Josiah Mantey [name] has a current medication list which includes the following long-term medication(s): hydralazine, levothyroxine, metoprolol succinate, oxycodone-acetaminophen, pregabalin, and rosuvastatin.  Pharmacotherapy (Medications Ordered): Meds ordered this encounter  Medications   oxyCODONE-acetaminophen (PERCOCET) 10-325 MG tablet    Sig: Take 1 tablet by mouth every 8 (eight) hours as needed.    Dispense:  90 tablet    Refill:  0    DO NOT: delete (not duplicate); no partial-fill  (will deny script to complete), no refill request (F/U required). DISPENSE: 1 day early if closed on fill date. WARN: No CNS-depressants within 8 hrs of med.   Orders:  No orders of the defined types were placed in this encounter.  Follow-up plan:   Return in about 30 days (around 12/22/2022) for (F2F), (MM) w/ Dr. Cherylann Ratel.          Recent Visits No visits were found meeting these conditions. Showing recent visits within past 90 days and meeting all other requirements Today's Visits Date Type Provider Dept  11/22/22 Office Visit Delano Metz, MD [facility]  Showing today's visits and meeting all other requirements Future Appointments Date Type Provider Dept  12/21/22 Appointment Edward Jolly, MD [facility]  Showing future appointments within next 90 days and meeting all other requirements  I discussed the assessment and treatment plan with the patient. The patient was provided an opportunity to ask questions and all were answered. The patient agreed with the plan and demonstrated an understanding of the instructions.  Patient advised to call back or seek an in-person evaluation if the symptoms or condition worsens.  Duration of encounter: 30 minutes.  Total time on encounter, as per AMA guidelines included both the face-to-face and non-face-to-face time personally spent by the physician and/or other qualified health care professional(s) on the day of the encounter (includes time in activities that require the physician or other qualified health care professional and does not include time in activities normally performed by clinical staff). Physician's time may include the following activities when performed: Preparing to see the patient (e.g., pre-charting review of records, searching for previously ordered imaging, lab work, and nerve conduction tests) Review of prior analgesic pharmacotherapies. Reviewing PMP Interpreting ordered tests (e.g., lab work,  imaging, nerve conduction tests) Performing post-procedure evaluations, including interpretation of diagnostic procedures Obtaining and/or reviewing separately obtained history Performing a medically appropriate examination and/or evaluation Counseling and educating the patient/family/caregiver Ordering medications, tests, or procedures Referring and communicating with other health care professionals (when not separately reported) Documenting clinical information in the electronic or other health record Independently interpreting results (not separately reported) and communicating results to the patient/ family/caregiver Care coordination (not separately reported)  Note by: Oswaldo Done, MD Date: 11/22/2022; Time: 11:20 AM

## 2022-11-19 NOTE — Progress Notes (Unsigned)
11/24/22 8:59 AM   Paul Ana [name] Jan 25, 1946 [id number]  Referring provider:  Leafy Ro, MD [address] [place],  [place] [address]  Urological history  1. ED -contributing factors of age, BPH, spinal injury, brain injury, DM, HTN, HLD and sleep apnea -PDE5i's intolerable    2. BPH with LU TS -tamsulosin 0.4 mg   3. Prostate cancer screening -PSA (06/2022) 2.01 -maternal uncle with fatal prostate cancer  4. Renal cysts -contrast CT (10/2022) - Multiple cystic densities in the bilateral kidney, the largest being on the right at 3.7 cm.   Chief Complaint  Patient presents with   Follow-up   Erectile Dysfunction   Benign Prostatic Hypertrophy    HPI: Paul Marsack [name] is a 77 y.o.male who presents today for ED.   Previous records reviewed.   I PSS 5/2  PVR 91 mL  No urinary complaints. Patient denies any modifying or aggravating factors.  Patient denies any recent UTI's, gross hematuria, dysuria or suprapubic/flank pain.  Patient denies any fevers, chills, nausea or vomiting.     IPSS     Row Name 11/24/22 0800         International Prostate Symptom Score   How often have you had the sensation of not emptying your bladder? Not at All     How often have you had to urinate less than every two hours? Less than half the time     How often have you found you stopped and started again several times when you urinated? Less than 1 in 5 times     How often have you found it difficult to postpone urination? Not at All     How often have you had a weak urinary stream? Not at All     How often have you had to strain to start urination? Not at All     How many times did you typically get up at night to urinate? 2 Times     Total IPSS Score 5       Quality of Life due to urinary symptoms   If you were to spend the rest of your life with your urinary condition just the way it is now how would you feel about that? Mostly Satisfied                 Score:  1-7 Mild 8-19 Moderate 20-35 Severe   SHIM 5  Patient not having spontaneous erections.  He denies any pain or curvature with erections.   He has no libido.     SHIM     Row Name 11/24/22 [phone]         SHIM: Over the last 6 months:   How do you rate your confidence that you could get and keep an erection? Very Low     When you had erections with sexual stimulation, how often were your erections hard enough for penetration (entering your partner)? Almost Never or Never     During sexual intercourse, how often were you able to maintain your erection after you had penetrated (entered) your partner? Almost Never or Never     During sexual intercourse, how difficult was it to maintain your erection to completion of intercourse? Extremely Difficult     When you attempted sexual intercourse, how often was it satisfactory for you? Almost Never or Never       SHIM Total Score   SHIM 5  Score: 1-7 Severe ED 8-11 Moderate ED 12-16 Mild-Moderate ED 17-21 Mild ED 22-25 No ED    PMH: Past Medical History:  Diagnosis Date   Adenoma of left adrenal gland    Aortic atherosclerosis (HCC)    BPH (benign prostatic hyperplasia)    Bradycardia    CAD (coronary artery disease) 08/26/2022   a.) cCTA 08/26/2022: Ca2+ 14.3 (12th %'ile for age/sex.race match control); (<25%) pLAD   Cellulitis of left thigh 08/2022   CHF (congestive heart failure) (HCC)    a.) TTE 10/23/2015: EF 60-65%, mod-sev LVH, mild biatrial dil, degen MV disease, AoV sclerosis, asc Ao 38 mm, G1DD; b.) TTE 08/05/2022: EF 45-50%, mod-sev LVH (speckled pattern), sev biatrial dil, mild MR, AoV sclerosis, G1DD   Chronic pain syndrome    a.) on COT managed by pain management   Chronic, continuous use of opioids    a.) chronic pain syndrome/chronic back pain; managed by pain management   CKD (chronic kidney disease) stage 3, GFR 30-59 ml/min (HCC)    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   a.) Rai stage III   DDD (degenerative disc disease), cervical    Difficult airway 10/22/2015   a.) 1st attempt with glidescope  --> macroglossic (unsuccessful); b.) 2nd attempt with Mac 4 and direct laryngoscopy with a 8.0 ETT --> tube too large to pass through the cords; b.) 3rd attempt (successful) with a Mac 4 and a 7.5 tube   Diverticulosis    DM (diabetes mellitus), type 2 (HCC)    Dysplasia of prostate    Erectile dysfunction    Frequent falls    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   History of MRSA infection 08/28/2022   a.) MRSA PCR (+) 08/28/2022; culture from LEFT thigh abscess   HTN (hypertension)    Hyperlipidemia    Hypogonadism in male    Hypothyroidism    IDA (iron deficiency anemia)    LBBB (left bundle branch block)    Long-term use of aspirin therapy    Lumbar spinal stenosis    Mild cardiomegaly    Nodule of right lung    Orchitis and epididymitis 06/16/2013   OSA on CPAP    Palindromic rheumatism, hand    Pericardial effusion    Prostatitis    Sepsis (HCC) 08/2022   Subdural hematoma (HCC) 10/22/2015   a.) s/p traumatic mechanical fall --> CT head 10/22/2015: high-density SDH the left cerebral convexity with13 mm midline shift --> s/p LEFT frontal burr hole craniotomy   Tubular adenoma of colon    Ventral hernia     Surgical History: Past Surgical History:  Procedure Laterality Date   BACK SURGERY     BIOPSY  08/21/2022   Procedure: BIOPSY;  Surgeon: Jaynie Collins, DO;  Location: [facility] ENDOSCOPY;  Service: Gastroenterology;;   BIOPSY  08/23/2022   Procedure: BIOPSY;  Surgeon: Jaynie Collins, DO;  Location: [facility] ENDOSCOPY;  Service: Gastroenterology;;   Guss Bunde OF CRANIUM Left 10/22/2015    COLONOSCOPY WITH PROPOFOL N/A 08/23/2022   Procedure: COLONOSCOPY WITH PROPOFOL;  Surgeon: Jaynie Collins, DO;  Location: [facility] ENDOSCOPY;  Service: Gastroenterology;  Laterality: N/A;   ESOPHAGOGASTRODUODENOSCOPY (EGD) WITH PROPOFOL N/A 08/21/2022   Procedure: ESOPHAGOGASTRODUODENOSCOPY (EGD) WITH PROPOFOL;  Surgeon: Jaynie Collins, DO;  Location:  [facility] ENDOSCOPY;  Service: Gastroenterology;  Laterality: N/A;   GIVENS CAPSULE STUDY N/A 08/23/2022   Procedure: GIVENS CAPSULE STUDY;  Surgeon: Jaynie Collins, DO;  Location: [facility] ENDOSCOPY;  Service: Gastroenterology;  Laterality: N/A;   IRRIGATION AND DEBRIDEMENT ABSCESS Left 08/28/2022   Procedure: IRRIGATION AND DEBRIDEMENT ABSCESS LEFT UPPER THIGH/GROIN;  Surgeon: Leafy Ro, MD;  Location: [facility] ORS;  Service: General;  Laterality: Left;   LAPAROSCOPIC RIGHT COLECTOMY Right 09/28/2022   Procedure: LAPAROSCOPIC RIGHT COLECTOMY, RNFA to assist;  Surgeon: Leafy Ro, MD;  Location: [facility] ORS;  Service: General;  Laterality: Right;   POLYPECTOMY  08/23/2022   Procedure: POLYPECTOMY;  Surgeon: Jaynie Collins, DO;  Location: [facility] ENDOSCOPY;  Service: Gastroenterology;;   SUBMUCOSAL TATTOO INJECTION  08/23/2022   Procedure: SUBMUCOSAL TATTOO INJECTION;  Surgeon: Jaynie Collins, DO;  Location: [facility] ENDOSCOPY;  Service: Gastroenterology;;   TONSILLECTOMY     VENTRAL HERNIA REPAIR N/A 09/28/2022   Procedure: HERNIA REPAIR VENTRAL ADULT;  Surgeon: Leafy Ro, MD;  Location: [facility] ORS;  Service: General;  Laterality: N/A;    Home Medications:  Allergies as of 11/24/2022   No Known Allergies      Medication List        Accurate as of November 24, 2022  8:59 AM. If you have any questions, ask your nurse or doctor.          aspirin EC 81 MG tablet Take 1 tablet (81 mg total) by mouth daily. Swallow whole.   Brukinsa 80 MG capsule Generic drug: zanubrutinib Take 2 capsules (160 mg total) by mouth 2  (two) times daily.   cyanocobalamin 1000 MCG tablet Commonly known as: VITAMIN B12 Take 1 tablet (1,000 mcg total) by mouth daily.   dapagliflozin propanediol 10 MG Tabs tablet Commonly known as: Farxiga Take 1 tablet (10 mg total) by mouth daily before breakfast.   Entresto 24-26 MG Generic drug: sacubitril-valsartan Take 1 tablet by mouth 2 (two) times daily.   hydrALAZINE 50 MG tablet Commonly known as: APRESOLINE Take 2 tablets (100 mg total) by mouth 2 (two) times daily.   Klor-Con M20 20 MEQ tablet Generic drug: potassium chloride SA Take 20 mEq by mouth at bedtime.   levothyroxine 200 MCG tablet Commonly known as: SYNTHROID Take 200 mcg by mouth daily before breakfast.   metFORMIN 500 MG tablet Commonly known as: GLUCOPHAGE Take 500 mg by mouth 2 (two) times daily with a meal.   methocarbamol 500 MG tablet Commonly known as: ROBAXIN Take 1 tablet (500 mg total) by mouth every 8 (eight) hours as needed for muscle spasms.   metoprolol succinate 25 MG 24 hr tablet Commonly known as: Toprol XL Take 1 tablet (25 mg total) by mouth daily. What changed: when to take this   ondansetron 4 MG disintegrating tablet Commonly known as: ZOFRAN-ODT Take 1 tablet (4 mg total) by mouth every 8 (eight) hours as needed for nausea or vomiting.   oxyCODONE-acetaminophen 10-325 MG tablet Commonly known as: PERCOCET Take 1 tablet by mouth every 8 (eight) hours as needed.   pregabalin 50 MG capsule Commonly known as: LYRICA Take 1 capsule (50 mg total) by mouth 3 (three) times daily for 14 days.   rosuvastatin 40 MG tablet Commonly known as: CRESTOR Take 1 tablet (40 mg total) by mouth daily. What changed: when to take this   sildenafil 100 MG tablet Commonly known as: VIAGRA Take 1 tablet (100 mg total) by mouth daily as needed for  erectile dysfunction. Take two hours prior to intercourse on an empty stomach Started by: Michiel Cowboy   tamsulosin 0.4 MG Caps  capsule Commonly known as: FLOMAX Take 0.4 mg by mouth daily.        Allergies:  No Known Allergies  Family History: Family History  Problem Relation Age of Onset   Cancer Maternal Aunt    Prostate cancer Neg Hx    Kidney disease Neg Hx    Kidney cancer Neg Hx    Bladder Cancer Neg Hx     Social History:  reports that he has quit smoking. His smoking use included cigarettes. He has been exposed to tobacco smoke. He has never used smokeless tobacco. He reports that he does not drink alcohol and does not use drugs.   Physical Exam: BP (!) 176/77   Pulse 67   Constitutional:  Well nourished. Alert and oriented, No acute distress. HEENT: [place] AT, moist mucus membranes.  Trachea midline Cardiovascular: No clubbing, cyanosis, or edema. Respiratory: Normal respiratory effort, no increased work of breathing. GU: No CVA tenderness.  No bladder fullness or masses.  Patient with circumcised phallus.  Right reducible hernia.  Urethral meatus is patent.  No penile discharge. No penile lesions or rashes. Scrotum without lesions, cysts, rashes and/or edema.  Testicles are located scrotally bilaterally. No masses are appreciated in the testicles. Left and right epididymis are normal. Rectal: Patient with  normal sphincter tone. Anus and perineum without scarring or rashes. No rectal masses are appreciated. Prostate is approximately 50 grams, no nodules are appreciated. Seminal vesicles could not be palpated.  Neurologic: Grossly intact, no focal deficits, moving all 4 extremities. Psychiatric: Normal mood and affect.   Laboratory Data: CMP     Component Value Date/Time   NA 140 11/15/2022 1255   NA 139 10/06/2013 1441   K 3.6 11/15/2022 1255   K 4.1 10/06/2013 1441   CL 105 11/15/2022 1255   CL 102 10/06/2013 1441   CO2 25 11/15/2022 1255   CO2 28 10/06/2013 1441   GLUCOSE 95 11/15/2022 1255   GLUCOSE 206 (H) 10/06/2013 1441   BUN 13 11/15/2022 1255   BUN 27 (H) 10/06/2013 1441    CREATININE 0.92 11/15/2022 1255   CREATININE 1.08 07/06/2021 1030   CALCIUM 8.7 (L) 11/15/2022 1255   CALCIUM 9.1 10/06/2013 1441   PROT 6.5 11/15/2022 1255   PROT 6.1 10/29/2022 0950   PROT 7.9 10/06/2013 1441   ALBUMIN 3.7 11/15/2022 1255   ALBUMIN 3.7 (L) 10/29/2022 0950   ALBUMIN 3.3 (L) 10/06/2013 1441   AST 34 11/15/2022 1255   ALT 23 11/15/2022 1255   ALT 22 10/06/2013 1441   ALKPHOS 74 11/15/2022 1255   ALKPHOS 70 10/06/2013 1441   BILITOT 1.0 11/15/2022 1255   EGFR 72 07/06/2021 1030   GFRNONAA >60 11/15/2022 1255   GFRNONAA 53 (L) 10/06/2013 1441    CBC    Component Value Date/Time   WBC 5.5 11/15/2022 1255   WBC 11.1 (H) 11/12/2022 0633   RBC 3.31 (L) 11/15/2022 1255   RBC 3.37 (L) 11/15/2022 1255   HGB 9.6 (L) 11/15/2022 1255   HGB 11.8 (L) 10/06/2013 1441   HCT 30.8 (L) 11/15/2022 1255   HCT 38.9 09/18/2014 1000   PLT 184 11/15/2022 1255   PLT 295 10/06/2013 1441   MCV 91.4 11/15/2022 1255   MCV 92 10/06/2013 1441   MCH 28.5 11/15/2022 1255   MCHC 31.2 11/15/2022 1255   RDW 20.3 (H)  11/15/2022 1255   RDW 14.2 10/06/2013 1441   LYMPHSABS 2.4 11/15/2022 1255   LYMPHSABS 2.3 10/06/2013 1441   MONOABS 0.5 11/15/2022 1255   MONOABS 0.4 10/06/2013 1441   EOSABS 0.2 11/15/2022 1255   EOSABS 0.4 10/06/2013 1441   BASOSABS 0.0 11/15/2022 1255   BASOSABS 0.1 10/06/2013 1441    Component     Latest Ref Rng 11/12/2022  Specific Gravity, UA     1.010 - 1.025    pH, UA     5.0 - 8.0    Color, UA   Appearance Ur     Clear    Leukocytes,UA     Negative    Protein,UA     Negative    Glucose, UA     NEGATIVE mg/dL >=161 !   Ketones, UA   RBC, UA   Bilirubin, UA   Urobilinogen, Ur     0.2 - 1.0 mg/dL   Nitrite, UA   Microscopic Examination   WBC, UA     0 - 5 WBC/hpf 0-5   RBC, Urine     0 - 2 /hpf   Epithelial Cells (non renal)     0 - 10 /hpf   Bacteria, UA     NONE SEEN  NONE SEEN   Casts     None seen /lpf   Cast Type     N/A    Clarity,  UA   Glucose     Negative    Urobilinogen, UA     0.2 or 1.0 E.U./dL   Appearance     CLEAR  CLEAR !   Color, Urine     YELLOW  YELLOW !   Specific Gravity, Urine     1.005 - 1.030  1.021   pH     5.0 - 8.0  6.0   Hgb urine dipstick     NEGATIVE  NEGATIVE   Bilirubin Urine     NEGATIVE  NEGATIVE   Ketones, ur     NEGATIVE mg/dL 5 !   Protein     NEGATIVE mg/dL 30 !   Nitrite     NEGATIVE  NEGATIVE   Leukocytes,Ua     NEGATIVE  NEGATIVE   RBC / HPF     0 - 5 RBC/hpf 0-5   Squamous Epithelial / HPF     0 - 5 /HPF 0   Mucus PRESENT     Legend: ! Abnormal I have reviewed the labs.   Pertinent Imaging:  11/24/22 08:40  Scan Result 91 ml    Assessment & Plan:    1. BPH with LU TS -PSA pending -continue conservative management, avoiding bladder irritants and timed voiding's -Continue tamsulosin 0.4 mg daily  2. ED -Has no libido,  we will check a testosterone level today -He would like to retry sildenafil 100 mg, on-demand dosing   Return in about 1 month (around 12/25/2022) for SHIM .  Cloretta Ned   [facility] [address] [place], [place] [address] [phone]

## 2022-11-22 ENCOUNTER — Ambulatory Visit: Payer: No Typology Code available for payment source | Attending: Pain Medicine | Admitting: Pain Medicine

## 2022-11-22 ENCOUNTER — Other Ambulatory Visit: Payer: Self-pay | Admitting: Physician Assistant

## 2022-11-22 ENCOUNTER — Encounter: Payer: Self-pay | Admitting: Pain Medicine

## 2022-11-22 VITALS — BP 178/92 | HR 62 | Temp 97.2°F | Resp 16 | Ht 73.0 in | Wt 221.0 lb

## 2022-11-22 DIAGNOSIS — M5416 Radiculopathy, lumbar region: Secondary | ICD-10-CM | POA: Diagnosis not present

## 2022-11-22 DIAGNOSIS — G8929 Other chronic pain: Secondary | ICD-10-CM | POA: Diagnosis present

## 2022-11-22 DIAGNOSIS — G894 Chronic pain syndrome: Secondary | ICD-10-CM | POA: Diagnosis not present

## 2022-11-22 DIAGNOSIS — Z79899 Other long term (current) drug therapy: Secondary | ICD-10-CM | POA: Diagnosis not present

## 2022-11-22 DIAGNOSIS — M5412 Radiculopathy, cervical region: Secondary | ICD-10-CM | POA: Diagnosis not present

## 2022-11-22 DIAGNOSIS — Z79891 Long term (current) use of opiate analgesic: Secondary | ICD-10-CM | POA: Insufficient documentation

## 2022-11-22 DIAGNOSIS — M542 Cervicalgia: Secondary | ICD-10-CM | POA: Insufficient documentation

## 2022-11-22 DIAGNOSIS — M48062 Spinal stenosis, lumbar region with neurogenic claudication: Secondary | ICD-10-CM | POA: Insufficient documentation

## 2022-11-22 MED ORDER — OXYCODONE-ACETAMINOPHEN 10-325 MG PO TABS: 1.0000 | ORAL_TABLET | Freq: Three times a day (TID) | ORAL | 0 refills | Status: DC | PRN

## 2022-11-22 NOTE — Progress Notes (Signed)
Nursing Pain Medication Assessment:  Safety precautions to be maintained throughout the outpatient stay will include: orient to surroundings, keep bed in low position, maintain call bell within reach at all times, provide assistance with transfer out of bed and ambulation.  Medication Inspection Compliance: Pill count conducted under aseptic conditions, in front of the patient. Neither the pills nor the bottle was removed from the patient's sight at any time. Once count was completed pills were immediately returned to the patient in their original bottle.  Medication: Oxycodone IR Pill/Patch Count:  0 of 90 pills remain Pill/Patch Appearance: Markings consistent with prescribed medication Bottle Appearance: Standard pharmacy container. Clearly labeled. Filled Date: 07 / 31 / 2024 Last Medication intake:  Today

## 2022-11-22 NOTE — Patient Instructions (Signed)

## 2022-11-22 NOTE — Telephone Encounter (Signed)
Medication Refill - Medication: Metformin 500 mg  Has the patient contacted their pharmacy? no [facility] called in for pt  Preferred Pharmacy (with phone number or street name):  [facility] [address] [place]), [place] - [address] Phone: [phone]  Fax: [phone]     Has the patient been seen for an appointment in the last year OR does the patient have an upcoming appointment? No, [facility] says they will let pt know to make appt  Agent: Please be advised that RX refills may take up to 3 business days. We ask that you follow-up with your pharmacy.

## 2022-11-23 ENCOUNTER — Other Ambulatory Visit: Payer: Self-pay | Admitting: Oncology

## 2022-11-23 ENCOUNTER — Other Ambulatory Visit: Payer: Self-pay

## 2022-11-23 ENCOUNTER — Other Ambulatory Visit (HOSPITAL_COMMUNITY): Payer: Self-pay

## 2022-11-23 DIAGNOSIS — C9110 Chronic lymphocytic leukemia of B-cell type not having achieved remission: Secondary | ICD-10-CM

## 2022-11-23 MED ORDER — BRUKINSA 80 MG PO CAPS
160.0000 mg | ORAL_CAPSULE | Freq: Two times a day (BID) | ORAL | 1 refills | Status: DC
  Filled 2022-11-23: qty 120, 30d supply, fill #0

## 2022-11-23 NOTE — Progress Notes (Signed)
Specialty Pharmacy Refill Coordination Note  Paul Briggs. is a 77 y.o. male contacted today regarding refills of specialty medication(s) Brukinsa   Patient requested Delivery   Delivery date: 11/26/22   Verified address: [address] [place] [address]   Medication will be filled on 11/25/22. *Pending Refill Request*

## 2022-11-23 NOTE — Telephone Encounter (Signed)
Requested medications are due for refill today.  yes  Requested medications are on the active medications list.  yes  Last refill. 12/25/2021  Future visit scheduled.   yes  Notes to clinic.  Medication is historical.    Requested Prescriptions  Pending Prescriptions Disp Refills   metFORMIN (GLUCOPHAGE) 500 MG tablet      Sig: Take 1 tablet (500 mg total) by mouth 2 (two) times daily with a meal.     Endocrinology:  Diabetes - Biguanides Failed - 11/22/2022  5:50 PM      Failed - Valid encounter within last 6 months    Recent Outpatient Visits   None     Future Appointments             Tomorrow Hulan Fray [facility]   In 4 weeks Jacky Kindle, FNP [facility]   In 3 months Furth, Cadence H, PA-C [facility] - Cr in normal range and within 360 days    Creatinine  Date Value Ref Range Status  11/15/2022 0.92 0.61 - 1.24 mg/dL Final   Creat  Date Value Ref Range Status  07/06/2021 1.08 0.70 - 1.28 mg/dL Final         Passed - HBA1C is between 0 and 7.9 and within 180 days    HbA1c POC (<> result, manual entry)  Date Value Ref Range Status  07/06/2021 6.5 4.0 - 5.6 % Final   Hgb A1c MFr Bld  Date Value Ref Range Status  08/21/2022 6.4 (H) 4.8 - 5.6 % Final    Comment:    (NOTE)         Prediabetes: 5.7 - 6.4         Diabetes: >6.4         Glycemic control for adults with diabetes: <7.0          Passed - eGFR in normal range and within 360 days    EGFR (African American)  Date Value Ref Range Status  10/06/2013 >60  Final   GFR calc Af Amer  Date Value Ref Range Status  10/22/2015 >60 >60 mL/min Final    Comment:    (NOTE) The eGFR has been calculated using the CKD EPI equation. This calculation has not been validated in all clinical situations. eGFR's persistently <60 mL/min signify possible Chronic Kidney Disease.    EGFR (Non-African  Amer.)  Date Value Ref Range Status  10/06/2013 53 (L)  Final    Comment:    eGFR values <36mL/min/1.73 m2 may be an indication of chronic kidney disease (CKD). Calculated eGFR is useful in patients with stable renal function. The eGFR calculation will not be reliable in acutely ill patients when serum creatinine is changing rapidly. It is not useful in  patients on dialysis. The eGFR calculation may not be applicable to patients at the low and high extremes of body sizes, pregnant women, and vegetarians.    GFR, Estimated  Date Value Ref Range Status  11/15/2022 >60 >60 mL/min Final    Comment:    (NOTE) Calculated using the CKD-EPI Creatinine Equation (2021)    eGFR  Date Value Ref Range Status  07/06/2021 72 > OR = 60 mL/min/1.67m2 Final    Comment:    The eGFR is based on the CKD-EPI 2021 equation. To calculate  the new eGFR from a previous Creatinine  or Cystatin C result, go to https://www.kidney.org/professionals/ kdoqi/gfr%5Fcalculator          Passed - B12 Level in normal range and within 720 days    Vitamin B-12  Date Value Ref Range Status  11/15/2022 519 180 - 914 pg/mL Final    Comment:    (NOTE) This assay is not validated for testing neonatal or myeloproliferative syndrome specimens for Vitamin B12 levels. Performed at [facility], [address] [place], [place] [address]          Passed - CBC within normal limits and completed in the last 12 months    WBC  Date Value Ref Range Status  11/12/2022 11.1 (H) 4.0 - 10.5 K/uL Final   WBC Count  Date Value Ref Range Status  11/15/2022 5.5 4.0 - 10.5 K/uL Final   RBC  Date Value Ref Range Status  11/15/2022 3.37 (L) 4.22 - 5.81 MIL/uL Final   RBC.  Date Value Ref Range Status  11/15/2022 3.31 (L) 4.22 - 5.81 MIL/uL Final   Hemoglobin  Date Value Ref Range Status  11/15/2022 9.6 (L) 13.0 - 17.0 g/dL Final   HGB  Date Value Ref Range Status  10/06/2013 11.8 (L) 13.0 - 18.0 g/dL Final    HCT  Date Value Ref Range Status  11/15/2022 30.8 (L) 39.0 - 52.0 % Final   Hematocrit  Date Value Ref Range Status  09/18/2014 38.9 37.5 - 51.0 % Final   MCHC  Date Value Ref Range Status  11/15/2022 31.2 30.0 - 36.0 g/dL Final   [facility]  Date Value Ref Range Status  11/15/2022 28.5 26.0 - 34.0 pg Final   MCV  Date Value Ref Range Status  11/15/2022 91.4 80.0 - 100.0 fL Final  10/06/2013 92 80 - 100 fL Final   No results found for: "PLTCOUNTKUC", "LABPLAT", "POCPLA" RDW  Date Value Ref Range Status  11/15/2022 20.3 (H) 11.5 - 15.5 % Final  10/06/2013 14.2 11.5 - 14.5 % Final

## 2022-11-23 NOTE — Progress Notes (Signed)
Specialty Pharmacy Ongoing Clinical Assessment Note  Paul Caravalho [name] is a 77 y.o. male who is being followed by the specialty pharmacy service for RxSp Oncology   Patient's specialty medication(s) reviewed today: Brukinsa   Missed doses in the last 4 weeks: More than 5 (medication was held by provider)   Patient/Caregiver did not have any additional questions or concerns.   Therapeutic benefit summary: Patient is achieving benefit   Adverse events/side effects summary: No adverse events/side effects   Patient's therapy is appropriate to: Continue    Goals Addressed             This Visit's Progress    Slow Disease Progression       Patient is on track. Patient will maintain adherence         Follow up:  3 months  [name] Specialty Pharmacist

## 2022-11-23 NOTE — Progress Notes (Signed)
Refill received

## 2022-11-23 NOTE — Telephone Encounter (Signed)
CBC with Differential (Cancer Center Only) Order: [id number] Status: Final result     Visible to patient: Yes (seen)     Next appt: 11/24/2022 at 08:30 AM in Urology [facility], PA-C)     Dx: CLL (chronic lymphocytic leukemia) (HCC)   0 Result Notes          Component Ref Range & Units 8 d ago (11/15/22) 11 d ago (11/12/22) 4 wk ago (10/26/22) 1 mo ago (10/18/22) 1 mo ago (10/04/22) 1 mo ago (10/02/22) 1 mo ago (09/30/22)  WBC Count 4.0 - 10.5 K/uL 5.5 11.1 High  7.4 7.5 10.1 9.5 9.0  RBC 4.22 - 5.81 MIL/uL 3.37 Low  3.39 Low  3.36 Low  3.00 Low  3.19 Low  3.26 Low  3.17 Low   Hemoglobin 13.0 - 17.0 g/dL 9.6 Low  9.7 Low  9.4 Low  8.6 Low  9.2 Low  9.5 Low  9.4 Low   HCT 39.0 - 52.0 % 30.8 Low  30.9 Low  31.6 Low  28.1 Low  29.3 Low  31.1 Low  29.4 Low   MCV 80.0 - 100.0 fL 91.4 91.2 94.0 93.7 91.8 95.4 92.7  MCH 26.0 - 34.0 pg 28.5 28.6 28.0 28.7 28.8 29.1 29.7  MCHC 30.0 - 36.0 g/dL 78.4 69.6 29.5 Low  28.4 31.4 30.5 32.0  RDW 11.5 - 15.5 % 20.3 High  20.9 High  20.4 High  21.2 High  21.5 High  21.5 High  22.1 High   Platelet Count 150 - 400 K/uL 184 169 230 360 173 151 130 Low   nRBC 0.0 - 0.2 % 0.0 0.0 CM 0.0 CM 0.0 0.0 CM 0.0 CM 0.0 CM  Neutrophils Relative % % 44   41     Neutro Abs 1.7 - 7.7 K/uL 2.4   3.1     Lymphocytes Relative % 43   41     Lymphs Abs 0.7 - 4.0 K/uL 2.4   3.1     Monocytes Relative % 9   14     Monocytes Absolute 0.1 - 1.0 K/uL 0.5   1.0     Eosinophils Relative % 3   3     Eosinophils Absolute 0.0 - 0.5 K/uL 0.2   0.2     Basophils Relative % 1   1     Basophils Absolute 0.0 - 0.1 K/uL 0.0   0.1     WBC Morphology DIFF. CONFIRMED BY SMEAR   DIFF. CONFIRMED BY SMEAR     RBC Morphology MORPHOLOGY UNREMARKABLE   MORPHOLOGY UNREMARKABLE     Smear Review Normal platelet morphology   Normal platelet morphology CM     Comment: PLATELETS APPEAR ADEQUATE  Immature Granulocytes % 0   0     Abs Immature Granulocytes 0.00 - 0.07 K/uL  0.02   0.03     Comment: Performed at [facility], [address] [place], [place] [address]  Smudge Cells    PRESENT     Polychromasia    PRESENT CM     Resulting Agency [facility] LAB         Specimen Collected: 11/15/22 12:55 Last Resulted: 11/15/22 13:53      Lab Flowsheet      Order Details      View Encounter      Lab and Collection Details  Routing      Result History    View All Conversations on this Encounter      CM=Additional comments      Result Care Coordination   Patient Communication   Add Comments   Seen Back to Top    Other Results from 11/15/2022  Folate Order: [id number] Status: Final result      Visible to patient: Yes (not seen)      Next appt: 11/24/2022 at 08:30 AM in Urology [facility], PA-C)      Dx: CLL (chronic lymphocytic leukemia) (HCC)    0 Result Notes         Component Ref Range & Units 8 d ago 3 mo ago 5 mo ago 10 mo ago  Folate >5.9 ng/mL 13.3 24.0 CM 10.8 CM 13.4 CM  Comment: Performed at [facility], [address] [place], [place] [address]  Resulting Agency [facility]         Specimen Collected: 11/15/22 12:55 Last Resulted: 11/15/22 14:42      Lab Flowsheet       Order Details       View Encounter       Lab and Collection Details       Routing       Result History     View All Conversations on this Encounter      CM=Additional comments      Result Care Coordination   Patient Communication   Add Comments   Add Notifications  Back to Top      Vitamin B12 Order: [id number] Status: Final result      Visible to patient: Yes (not seen)      Next appt: 11/24/2022 at 08:30 AM in Urology [facility], PA-C)      Dx: CLL (chronic lymphocytic leukemia) (HCC)    0 Result Notes         Component Ref Range & Units 8 d ago 3 mo ago 5 mo ago 10 mo  ago  Vitamin B-12 180 - 914 pg/mL 519 270 CM 287 CM 432 CM  Comment: (NOTE) This assay is not validated for testing neonatal or myeloproliferative syndrome specimens for Vitamin B12 levels. Performed at [facility], [address], [place], [place] [address]  Resulting Agency [facility] CLIN LAB [facility] CL[facility] LAB         Specimen Collected: 11/15/22 12:55 Last Resulted: 11/15/22 19:31      Lab Flowsheet       Order Details       View Encounter       Lab and Collection Details       Routing       Result History     View All Conversations on this Encounter      CM=Additional comments      Result Care Coordination   Patient Communication   Add Comments   Add Notifications  Back to Top       Contains abnormal data Retic Panel Order: [id number] Status: Final result      Visible to patient: Yes (seen)      Next appt: 11/24/2022 at 08:30 AM in Urology [facility], PA-C)      Dx: Normocytic anemia    0 Result Notes            Component Ref Range & Units 8 d ago (11/15/22) 1 mo  ago (10/18/22) 2 mo ago (09/21/22) 2 mo ago (09/03/22) 3 mo ago (08/20/22) 3 mo ago (08/20/22) 3 mo ago (08/06/22)  Retic Ct Pct 0.4 - 3.1 % 1.1 2.0 2.8 3.6 High  2.3 2.2 1.6  RBC. 4.22 - 5.81 MIL/uL 3.31 Low  3.01 Low  3.36 Low  2.74 Low  2.22 Low  2.31 Low  2.89 Low   Retic Count, Absolute 19.0 - 186.0 K/uL 37.7 59.3 94.8 98.4 51.5 49.7 45.4  Immature Retic Fract 2.3 - 15.9 % 11.4 21.9 High  21.8 High  33.2 High  25.3 High  CM 17.2 High  13.4  Reticulocyte Hemoglobin >27.9 pg 26.5 Low  28.7 CM 31.4 CM 29.7 CM  22.4 Low  CM 24.3 Low  CM  Comment:        A RET-He < 28 pg is an indication of iron-deficient or iron- insufficient erythropoiesis. Patients with thalassemia may also have a decreased RET-He result unrelated to iron availability.     If this patient has chronic kidney disease and does not have a hemoglobinopathy he/she meets criteria for  iron deficiency per the 2016 NICE guidelines. Refer to specific guidelines to determine the appropriate thresholds for treating CKD- associated iron deficiency. TSAT and ferritin should be used in patients with hemoglobinopathies (e.g. thalassemia). Performed at [facility], [address] [place], [place] [address]  Resulting Agency [facility] CLIN LAB [facility] CLIN LAB [facility] LAB [facility] CL[facility] LAB         Specimen Collected: 11/15/22 12:55 Last Resulted: 11/15/22 13:17      Lab Flowsheet       Order Details       View Encounter       Lab and Collection Details       Routing       Result History     View All Conversations on this Encounter      CM=Additional comments      Result Care Coordination   Patient Communication   Add Comments   Seen Back to Top      Ferritin Order: [id number] Status: Final result      Visible to patient: Yes (not seen)      Next appt: 11/24/2022 at 08:30 AM in Urology [facility], PA-C)      Dx: Normocytic anemia    0 Result Notes            Component Ref Range & Units 8 d ago (11/15/22) 1 mo ago (10/18/22) 3 mo ago (08/20/22) 3 mo ago (08/06/22) 4 mo ago (07/21/22) 6 mo ago (05/21/22) 10 mo ago (01/01/22)  Ferritin 24 - 336 ng/mL 124 78 CM 12 Low  CM 14 Low  CM 12 Low  CM 6 Low  CM 56 CM  Comment: Performed at [facility], [address] [place], [place] [address]  Resulting Agency [facility] CLIN LAB [facility] CLIN LAB [facility] CLIN LAB [facility] CLIN LAB [facility] CLIN [facility] CL[facility] CLIN LAB         Specimen Collected: 11/15/22 12:55 Last Resulted: 11/15/22 14:42      Lab Flowsheet       Order Details       View Encounter       Lab and Collection Details       Routing       Result History     View All Conversations on this Encounter  CM=Additional comments      Result Care Coordination   Patient Communication   Add Comments   Add Notifications  Back to Top        Contains abnormal data Iron and TIBC Order: [id number] Status: Final result      Visible to patient: Yes (not seen)      Next appt: 11/24/2022 at 08:30 AM in Urology [facility], PA-C)      Dx: Normocytic anemia    0 Result Notes            Component Ref Range & Units 8 d ago (11/15/22) 1 mo ago (10/18/22) 3 mo ago (08/20/22) 3 mo ago (08/06/22) 4 mo ago (07/21/22) 6 mo ago (05/21/22) 10 mo ago (01/01/22)  Iron 45 - 182 ug/dL 33 Low  37 Low  324 CM 25 Low  37 Low  23 Low  61  TIBC 250 - 450 ug/dL 401 027 253 664 403 474 286  Saturation Ratios 17.9 - 39.5 % 12 Low  14 Low  45 High  7 Low  10 Low  6 Low  21  UIBC ug/dL 259 563 CM 875 CM 643 CM 347 CM 355 CM 225 CM  Comment: Performed at [facility], [address] [place], [place] [address]  Resulting Agency [facility]         Specimen Collected: 11/15/22 12:55 Last Resulted: 11/15/22 14:42      Lab Flowsheet       Order Details       View Encounter       Lab and Collection Details       Routing       Result History     View All Conversations on this Encounter      CM=Additional comments      Result Care Coordination   Patient Communication   Add Comments   Add Notifications  Back to Top       Contains abnormal data CMP (Cancer Center only) Order: [id number] Status: Final result      Visible to patient: Yes (seen)      Next appt: 11/24/2022 at 08:30 AM in Urology [facility], PA-C)      Dx: CLL (chronic lymphocytic leukemia) (HCC)    0 Result Notes      1 HM Topic            Component Ref Range & Units 8 d ago (11/15/22) 11 d ago (11/12/22) 3 wk ago (10/29/22) 4 wk ago (10/26/22) 1 mo ago (10/18/22) 1 mo ago (10/06/22) 1 mo ago (10/05/22)  Sodium 135 - 145 mmol/L 140 140  139 143 139 136  Potassium 3.5 - 5.1 mmol/L 3.6 3.4 Low   4.1 4.1 4.2 3.5  Chloride 98 - 111 mmol/L 105 105  106  108 113 High  111  CO2 22 - 32 mmol/L 25 23  26 24 22 21  Low   Glucose, Bld 70 - 99 mg/dL 95 016 High  CM  010 High  CM 103 High  CM 103 High  CM 124 High  CM  Comment: Glucose reference range applies only to samples taken after fasting for at least 8 hours.  BUN 8 - 23 mg/dL 13 14  15 14 15 17   Creatinine 0.61 - 1.24 mg/dL 9.32 3.55  7.32 2.02 5.42 0.99  Calcium 8.9 - 10.3  mg/dL 8.7 Low  8.6 Low   8.4 Low  8.9 8.3 Low  8.2 Low   Total Protein 6.5 - 8.1 g/dL 6.5 6.5 6.1 R  6.6    Albumin 3.5 - 5.0 g/dL 3.7 3.6 3.7 Low  R  3.6    AST 15 - 41 U/L 34 34 30 R  29    ALT 0 - 44 U/L 23 20 14  R  14    Alkaline Phosphatase 38 - 126 U/L 74 68 85 R  81    Total Bilirubin 0.3 - 1.2 mg/dL 1.0 1.2 0.3 R  0.7    GFR, Estimated >60 mL/min >60    >60 CM    Comment: (NOTE) Calculated using the CKD-EPI Creatinine Equation (2021)  Anion gap 5 - 15 10 12  CM  7 CM 11 CM 4 Low  CM 4 Low  CM  Comment: Performed at [facility], [address] [place], [place] [address]  Resulting Agency [facility] CLIN LAB [facility] CLIN LAB [facility] CLIN LAB CH CLIN LAB CH CLIN LAB CH CLIN LAB         Specimen Collected: 11/15/22 12:55 Last Resulted: 11/15/22 13:32

## 2022-11-24 ENCOUNTER — Ambulatory Visit (INDEPENDENT_AMBULATORY_CARE_PROVIDER_SITE_OTHER): Payer: No Typology Code available for payment source | Admitting: Urology

## 2022-11-24 ENCOUNTER — Encounter: Payer: Self-pay | Admitting: Urology

## 2022-11-24 VITALS — BP 176/77 | HR 67

## 2022-11-24 DIAGNOSIS — Z125 Encounter for screening for malignant neoplasm of prostate: Secondary | ICD-10-CM

## 2022-11-24 DIAGNOSIS — R6882 Decreased libido: Secondary | ICD-10-CM | POA: Diagnosis not present

## 2022-11-24 DIAGNOSIS — N138 Other obstructive and reflux uropathy: Secondary | ICD-10-CM

## 2022-11-24 DIAGNOSIS — N529 Male erectile dysfunction, unspecified: Secondary | ICD-10-CM | POA: Diagnosis not present

## 2022-11-24 DIAGNOSIS — N401 Enlarged prostate with lower urinary tract symptoms: Secondary | ICD-10-CM

## 2022-11-24 DIAGNOSIS — N3941 Urge incontinence: Secondary | ICD-10-CM

## 2022-11-24 LAB — BLADDER SCAN AMB NON-IMAGING: Scan Result: 91

## 2022-11-24 MED ORDER — METFORMIN HCL 500 MG PO TABS: 1000.0000 mg | ORAL_TABLET | Freq: Two times a day (BID) | ORAL | 0 refills | Status: DC

## 2022-11-24 MED ORDER — SILDENAFIL CITRATE 100 MG PO TABS: 100.0000 mg | ORAL_TABLET | Freq: Every day | ORAL | 1 refills | Status: DC | PRN

## 2022-11-25 LAB — TESTOSTERONE: Testosterone: 368 ng/dL (ref 264–916)

## 2022-11-27 LAB — PSA

## 2022-12-02 ENCOUNTER — Encounter: Payer: No Typology Code available for payment source | Admitting: Orthopedic Surgery

## 2022-12-07 ENCOUNTER — Encounter: Payer: PRIVATE HEALTH INSURANCE | Admitting: Student in an Organized Health Care Education/Training Program

## 2022-12-08 ENCOUNTER — Ambulatory Visit: Payer: No Typology Code available for payment source | Admitting: Physician Assistant

## 2022-12-09 DIAGNOSIS — E119 Type 2 diabetes mellitus without complications: Secondary | ICD-10-CM | POA: Diagnosis not present

## 2022-12-09 DIAGNOSIS — C9110 Chronic lymphocytic leukemia of B-cell type not having achieved remission: Secondary | ICD-10-CM | POA: Diagnosis not present

## 2022-12-09 DIAGNOSIS — L02211 Cutaneous abscess of abdominal wall: Secondary | ICD-10-CM | POA: Diagnosis not present

## 2022-12-09 DIAGNOSIS — E785 Hyperlipidemia, unspecified: Secondary | ICD-10-CM | POA: Diagnosis not present

## 2022-12-13 DIAGNOSIS — L0390 Cellulitis, unspecified: Secondary | ICD-10-CM | POA: Diagnosis not present

## 2022-12-13 DIAGNOSIS — L02211 Cutaneous abscess of abdominal wall: Secondary | ICD-10-CM | POA: Diagnosis not present

## 2022-12-16 ENCOUNTER — Other Ambulatory Visit: Payer: Self-pay

## 2022-12-20 ENCOUNTER — Inpatient Hospital Stay (HOSPITAL_BASED_OUTPATIENT_CLINIC_OR_DEPARTMENT_OTHER): Payer: No Typology Code available for payment source | Admitting: Oncology

## 2022-12-20 ENCOUNTER — Inpatient Hospital Stay: Payer: No Typology Code available for payment source | Attending: Oncology

## 2022-12-20 ENCOUNTER — Other Ambulatory Visit: Payer: Self-pay

## 2022-12-20 VITALS — BP 155/80 | HR 64 | Temp 96.3°F | Resp 18 | Wt 216.3 lb

## 2022-12-20 DIAGNOSIS — C9110 Chronic lymphocytic leukemia of B-cell type not having achieved remission: Secondary | ICD-10-CM | POA: Diagnosis not present

## 2022-12-20 DIAGNOSIS — N183 Chronic kidney disease, stage 3 unspecified: Secondary | ICD-10-CM | POA: Diagnosis not present

## 2022-12-20 DIAGNOSIS — N1831 Chronic kidney disease, stage 3a: Secondary | ICD-10-CM | POA: Diagnosis not present

## 2022-12-20 DIAGNOSIS — D509 Iron deficiency anemia, unspecified: Secondary | ICD-10-CM

## 2022-12-20 DIAGNOSIS — Z5111 Encounter for antineoplastic chemotherapy: Secondary | ICD-10-CM | POA: Diagnosis not present

## 2022-12-20 LAB — FERRITIN: Ferritin: 25 ng/mL (ref 24–336)

## 2022-12-20 LAB — CMP (CANCER CENTER ONLY)
ALT: 35 U/L (ref 0–44)
AST: 62 U/L — ABNORMAL HIGH (ref 15–41)
Albumin: 3.6 g/dL (ref 3.5–5.0)
Alkaline Phosphatase: 70 U/L (ref 38–126)
Anion gap: 11 (ref 5–15)
BUN: 17 mg/dL (ref 8–23)
CO2: 25 mmol/L (ref 22–32)
Calcium: 9 mg/dL (ref 8.9–10.3)
Chloride: 107 mmol/L (ref 98–111)
Creatinine: 1.2 mg/dL (ref 0.61–1.24)
GFR, Estimated: >60 mL/min (ref >60)
Glucose, Bld: 115 mg/dL — ABNORMAL HIGH (ref 70–99)
Potassium: 3.7 mmol/L (ref 3.5–5.1)
Sodium: 143 mmol/L (ref 135–145)
Total Bilirubin: 0.8 mg/dL (ref <1.2)
Total Protein: 6.5 g/dL (ref 6.5–8.1)

## 2022-12-20 LAB — CBC WITH DIFFERENTIAL (CANCER CENTER ONLY)
Abs Immature Granulocytes: 0.01 10*3/uL (ref 0.00–0.07)
Basophils Absolute: 0.1 10*3/uL (ref 0.0–0.1)
Basophils Relative: 1 %
Eosinophils Absolute: 0.2 10*3/uL (ref 0.0–0.5)
Eosinophils Relative: 2 %
HCT: 29.1 % — ABNORMAL LOW (ref 39.0–52.0)
Hemoglobin: 9.2 g/dL — ABNORMAL LOW (ref 13.0–17.0)
Immature Granulocytes: 0 %
Lymphocytes Relative: 66 %
Lymphs Abs: 7 10*3/uL — ABNORMAL HIGH (ref 0.7–4.0)
MCH: 29 pg (ref 26.0–34.0)
MCHC: 31.6 g/dL (ref 30.0–36.0)
MCV: 91.8 fL (ref 80.0–100.0)
Monocytes Absolute: 0.5 10*3/uL (ref 0.1–1.0)
Monocytes Relative: 5 %
Neutro Abs: 2.6 10*3/uL (ref 1.7–7.7)
Neutrophils Relative %: 26 %
Platelet Count: 162 10*3/uL (ref 150–400)
RBC: 3.17 MIL/uL — ABNORMAL LOW (ref 4.22–5.81)
RDW: 18.6 % — ABNORMAL HIGH (ref 11.5–15.5)
Smear Review: NORMAL
WBC Count: 10.3 10*3/uL (ref 4.0–10.5)
nRBC: 0 % (ref 0.0–0.2)

## 2022-12-20 LAB — RETIC PANEL
Immature Retic Fract: 13 % (ref 2.3–15.9)
RBC.: 3.19 MIL/uL — ABNORMAL LOW (ref 4.22–5.81)
Retic Count, Absolute: 38.6 10*3/uL (ref 19.0–186.0)
Retic Ct Pct: 1.2 % (ref 0.4–3.1)
Reticulocyte Hemoglobin: 28.5 pg (ref >27.9)

## 2022-12-20 LAB — IRON AND TIBC
Iron: 30 ug/dL — ABNORMAL LOW (ref 45–182)
Saturation Ratios: 11 % — ABNORMAL LOW (ref 17.9–39.5)
TIBC: 266 ug/dL (ref 250–450)
UIBC: 236 ug/dL

## 2022-12-20 MED ORDER — BRUKINSA 80 MG PO CAPS
160.0000 mg | ORAL_CAPSULE | Freq: Two times a day (BID) | ORAL | 2 refills | Status: DC
  Filled 2022-12-20 – 2022-12-22 (×2): qty 120, 30d supply, fill #0
  Filled 2023-01-14: qty 120, 30d supply, fill #1
  Filled 2023-02-10: qty 120, 30d supply, fill #2

## 2022-12-20 NOTE — Progress Notes (Signed)
Pt here for follow up. 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

## 2022-12-20 NOTE — Assessment & Plan Note (Signed)
Continue treatment as mentioned above.

## 2022-12-20 NOTE — Assessment & Plan Note (Addendum)
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 Lab Results  Component Value Date   HGB 9.2 (L) 12/20/2022   TIBC 266 12/20/2022   IRONPCTSAT 11 (L) 12/20/2022   FERRITIN 25 12/20/2022    Allergy patient did not additional IV Venofer weekly x 2.  Goals of ferritin 200

## 2022-12-20 NOTE — Progress Notes (Signed)
Hematology/Oncology Progress note [phone] [phone]        ASSESSMENT & PLAN:   Cancer Staging  CLL (chronic lymphocytic leukemia) (HCC) Staging form: Chronic Lymphocytic Leukemia / Small Lymphocytic Lymphoma, AJCC 8th Edition - Clinical stage from 12/28/2021: Modified Rai Stage III (Modified Rai risk: High, Lymphocytosis: Present, Adenopathy: Present, Organomegaly: Absent, Anemia: Present, Thrombocytopenia: Absent) - Signed by [name], MD on 01/20/2022   CLL (chronic lymphocytic leukemia) [facility]) Labs are reviewed and discussed with patient. CT chest abdomen pelvis results reviewed and discussed with patient.   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 Counts are stable.  continue Zanubrutinib 160 mg twice daily    CKD (chronic kidney disease) stage 3, GFR 30-59 ml/min (HCC) Encourage oral hydration and avoid nephrotoxins.     IDA (iron deficiency anemia) Anemia is likely multifactorial, could be due to CLL, anemia due to chronic kidney disease and IDA. Lab Results  Component Value Date   HGB 9.2 (L) 12/20/2022   TIBC 266 12/20/2022   IRONPCTSAT 11 (L) 12/20/2022   FERRITIN 25 12/20/2022    Allergy patient did not additional IV Venofer weekly x 2.  Goals of ferritin 200    Encounter for antineoplastic chemotherapy Continue treatment as mentioned above.   Orders Placed This Encounter  Procedures   CMP (Cancer Center only)    Standing Status:   Future    Standing Expiration Date:   12/20/2023   CBC with Differential (Cancer Center Only)    Standing Status:   Future    Standing Expiration Date:   12/20/2023   Iron and TIBC    Standing Status:   Future    Standing Expiration Date:   12/20/2023   Ferritin    Standing Status:   Future    Standing Expiration Date:   12/20/2023   Retic Panel    Standing Status:   Future    Standing Expiration Date:    12/20/2023   Follow up 3 months All questions were answered. The patient knows to call the clinic with any problems, questions or concerns.  [name], MD, PhD [facility] Hematology Oncology 12/20/2022    CHIEF COMPLAINTS/PURPOSE OF CONSULTATION:  CLL  HISTORY OF PRESENTING ILLNESS:  Paul Ana [name] 77 y.o. male presents for follow up of CLL I have reviewed his chart and materials related to his cancer extensively and collaborated history with the patient. Summary of oncologic history is as follows: Oncology History  CLL (chronic lymphocytic leukemia) (HCC)  12/25/2021 - 12/26/2021 Hospital Admission   Patient was admitted due to pain and swelling of right ankle, right knee and left wrist.  Patient was found to have WBC 30.4, with neutrophil 29% and lymphocyte 2%.  Peripheral smear showed leukocytosis with slight left shift in myeloid series and lymphocytosis with abnormal morphology.  Uric acid 5.5, worsening renal function with creatinine 1.35, BUN 18, GFR 55 (baseline creatinine 1.08 on 07/06/2021), lactic acid 1.1.  ESR normal, uric acid 5.5 normal, CRP 22.2, LDH 182  Orthopedic surgery was consulted s/p arthrocentesis, cell count 296 with 72% neutrophil, no crystal. Fluid negative for growth.  Patient received cefepime and vancomycin while in the hospital, Patient was discharged on Keflex for 5 days.   12/28/2021 Cancer Staging   Staging form: Chronic Lymphocytic Leukemia / Small Lymphocytic Lymphoma, AJCC 8th Edition - Clinical stage from 12/28/2021: Modified Rai Stage III (Modified Rai risk: High, Lymphocytosis: Present,  Adenopathy: Present, Organomegaly: Absent, Anemia: Present, Thrombocytopenia: Absent) - Signed by [name], MD on 01/20/2022 Stage prefix: Initial diagnosis Hemoglobin (Hgb) (g/dL): 40.9   81/01/9145 Initial Diagnosis   CLL (chronic lymphocytic leukemia)   12/27/21 peripheral blood flowcytometry showed Involvement by CD5+, CD23+, CD20+, CD22+ clonal B cell  population, phenotype typical for 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    01/05/2022 Imaging   CT chest abdomen pelvis wo contrast 1. Multiple prominent borderline enlarged and mildly enlarged lymph nodes, most evident in the low anatomic pelvis, as above, compatible with reported clinical history of CLL. 2. There also several small pulmonary nodules in the lungs measuring 5 mm or less in size. This is nonspecific, but statistically likely benign. No follow-up needed if patient is low-risk (and has no known or suspected primary neoplasm). Non-contrast chest CT can be considered in 12 months if patient is high-risk. This recommendation 3. Aortic atherosclerosis, in addition to left main and 2 vessel coronary artery disease. Please note that although the presence of coronary artery calcium documents the presence of coronary artery disease, the severity of this disease and any potential stenosis cannot be assessed on this non-gated CT examination. Assessment for  potential risk factor modification, dietary therapy or pharmacologic therapy may be warranted, if clinically indicated. 4. There are calcifications of the aortic valve. Echocardiographic correlation for evaluation of potential valvular dysfunction may be warranted if clinically indicated. 5. 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 6. Diverticulosis without evidence of acute diverticulitis at this time. 7. Mild cardiomegaly.   08/26/2022 Imaging   CT abdomen pelvis w contrast  1. Subcutaneous fat stranding within the left lower quadrant anterior abdominal wall extending into the anterior proximal left thigh, likely related to contusion given history of recent trauma. No fluid collection or hematoma. 2. No acute displaced fracture. 3. Retroperitoneal and pelvic  lymphadenopathy as above, with waxing and waning appearance. 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 4. 1.9 cm hypodensity within the medial aspect of the spleen,nonspecific. Leukemic involvement of the spleen cannot be excluded. 5. Distal colonic diverticulosis without diverticulitis. 6.  Aortic Atherosclerosis      INTERVAL HISTORY Paul Westerman [name] is a 77 y.o. male who has above history reviewed by me today presents for follow up visit for CLL. Off Zanubrutinib 160 mg twice daily + dark stool, no abdominal pain + fatigue 08/23/2022 Colonoscopy showed - Likely malignant partially obstructing tumor in the distal ascending colon. Biopsied. Tattooed. - Three 4 to 6 mm polyps in the transverse colon and in the ascending colon, removed with a cold snare. Resected and retrieved. - Five 3 to 7 mm polyps in the rectum, in the descending colon, in the transverse colon and in the ascending colon, removed with a hot snare. Resected and retrieved. 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 - Non- bleeding internal hemorrhoids.  Pathology showed 1. Ascending Colon Polyp, x2 cold snare - TUBULAR ADENOMA(S) (MULTIPLE FRAGMENTS) - NEGATIVE FOR HIGH-GRADE DYSPLASIA OR MALIGNANCY 2. Ascending Colon Polyp, hot snare - TUBULAR ADENOMA (MULTIPLE FRAGMENTS) - NEGATIVE FOR HIGH-GRADE DYSPLASIA OR MALIGNANCY 3. Ascending Colon Biopsy, mass cbx - TUBULAR ADENOMA (MULTIPLE FRAGMENTS) - SEE NOTE 4. Transverse Colon Polyp, hot snare; cold snare - TUBULAR ADENOMA (MULTIPLE FRAGMENTS) - NEGATIVE FOR HIGH-GRADE DYSPLASIA OR MALIGNANCY 5. Descending Colon Polyp, x2 hot snare - TUBULAR ADENOMA(S) (MULTIPLE FRAGMENTS) -  NEGATIVE FOR HIGH-GRADE DYSPLASIA OR MALIGNANCY 6. Rectum, polyp(s), hot snare - TUBULOVILLOUS ADENOMA (MULTIPLE FRAGMENTS) - NEGATIVE FOR HIGH-GRADE DYSPLASIA OR MALIGNANCY I&D Recent admission due to sepsis due to cellulitis, left groin abscess, s/p I&D   INTERVAL HISTORY Paul Radecki [name] is a 77 y.o. male who has above history reviewed by me today presents for follow up visit for CLL, iron deficiency anemia, colon tubular adenoma with high-grade dysplastic status post resection. Patient was feeling well today.  He has resumed working.   Denies any fatigue, abdominal pain, blood in his stool.  Denies night sweats or fever.  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   MEDICAL HISTORY:  Past Medical History:  Diagnosis Date   Adenoma of left adrenal gland    Aortic atherosclerosis (HCC)    BPH (benign prostatic hyperplasia)    Bradycardia    CAD (coronary artery disease) 08/26/2022   a.) cCTA 08/26/2022: Ca2+ 14.3 (12th %'ile for age/sex.race match control); (<25%) pLAD   Cellulitis of left thigh 08/2022   CHF (congestive heart failure) (HCC)    a.) TTE 10/23/2015: EF 60-65%, mod-sev LVH, mild biatrial dil, degen MV disease, AoV sclerosis, asc Ao 38 mm, G1DD; b.) TTE 08/05/2022: EF 45-50%, mod-sev LVH (speckled pattern), sev biatrial dil, mild MR, AoV sclerosis, G1DD   Chronic pain syndrome    a.) on COT managed by pain management   Chronic, continuous use of opioids    a.) chronic pain syndrome/chronic back pain; managed by pain management   CKD (chronic kidney disease) stage 3, GFR 30-59 ml/min (HCC)    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   a.) Rai stage III   DDD (degenerative disc disease), cervical    Difficult airway 10/22/2015   a.) 1st attempt with glidescope  --> macroglossic (unsuccessful); b.) 2nd attempt with Mac 4 and direct laryngoscopy with a 8.0 ETT --> tube too large to pass through the cords; b.) 3rd attempt (successful) with a Mac 4 and a 7.5 tube   Diverticulosis    DM (diabetes mellitus), type 2 (HCC)    Dysplasia of prostate    Erectile dysfunction    Frequent falls    Hepatic flexure mass 08/23/2022   a.) colonoscopy 08/23/2022: 14 x 15 mm partially obstructing ulcerated distal ascending colon  mass; tissue friable --> pathology resulted as tubular adenoma, however felt to be false (-) --> referral to sugery and colectomy recommended.   History of MRSA infection 08/28/2022   a.) MRSA PCR (+) 08/28/2022; culture from LEFT thigh abscess   HTN (hypertension)    Hyperlipidemia    Hypogonadism in male    Hypothyroidism    IDA (iron deficiency anemia)    LBBB (left bundle branch block)    Long-term use of aspirin therapy    Lumbar spinal stenosis    Mild cardiomegaly    Nodule of right lung    Orchitis and epididymitis 06/16/2013   OSA on CPAP    Palindromic rheumatism, hand    Pericardial effusion    Prostatitis    Sepsis (HCC) 08/2022   Subdural hematoma (HCC) 10/22/2015   a.) s/p traumatic mechanical fall --> CT head 10/22/2015: high-density SDH the left cerebral convexity with13 mm midline shift --> s/p LEFT frontal burr hole craniotomy   Tubular adenoma of colon    Ventral hernia     SURGICAL HISTORY: Past Surgical History:  Procedure Laterality Date   BACK SURGERY     BIOPSY  08/21/2022   Procedure: BIOPSY;  Surgeon: Jaynie Collins, DO;  Location: [facility] ENDOSCOPY;  Service: Gastroenterology;;   BIOPSY  08/23/2022   Procedure: BIOPSY;  Surgeon: Jaynie Collins, DO;  Location: [facility] ENDOSCOPY;  Service: Gastroenterology;;   Guss Bunde OF CRANIUM Left 10/22/2015   COLONOSCOPY WITH PROPOFOL N/A 08/23/2022   Procedure: COLONOSCOPY WITH PROPOFOL;  Surgeon: Jaynie Collins, DO;  Location: [facility] ENDOSCOPY;  Service: Gastroenterology;  Laterality: N/A;   ESOPHAGOGASTRODUODENOSCOPY (EGD) WITH PROPOFOL N/A 08/21/2022   Procedure: ESOPHAGOGASTRODUODENOSCOPY (EGD) WITH PROPOFOL;  Surgeon: Jaynie Collins, DO;  Location: [facility] ENDOSCOPY;  Service: Gastroenterology;  Laterality: N/A;   GIVENS CAPSULE STUDY N/A 08/23/2022   Procedure: GIVENS CAPSULE STUDY;  Surgeon: Jaynie Collins, DO;  Location: [facility] ENDOSCOPY;  Service: Gastroenterology;  Laterality: N/A;    IRRIGATION AND DEBRIDEMENT ABSCESS Left 08/28/2022   Procedure: IRRIGATION AND DEBRIDEMENT ABSCESS LEFT UPPER THIGH/GROIN;  Surgeon: Leafy Ro, MD;  Location: [facility] ORS;  Service: General;  Laterality: Left;   LAPAROSCOPIC RIGHT COLECTOMY Right 09/28/2022   Procedure: LAPAROSCOPIC RIGHT COLECTOMY, RNFA to assist;  Surgeon: Leafy Ro, MD;  Location: [facility] ORS;  Service: General;  Laterality: Right;   POLYPECTOMY  08/23/2022   Procedure: POLYPECTOMY;  Surgeon: Jaynie Collins, DO;  Location: [facility] ENDOSCOPY;  Service: Gastroenterology;;   SUBMUCOSAL TATTOO INJECTION  08/23/2022   Procedure: SUBMUCOSAL TATTOO INJECTION;  Surgeon: Jaynie Collins, DO;  Location: [facility] ENDOSCOPY;  Service: Gastroenterology;;   TONSILLECTOMY     VENTRAL HERNIA REPAIR N/A 09/28/2022   Procedure: HERNIA REPAIR VENTRAL ADULT;  Surgeon: Leafy Ro, MD;  Location: [facility] ORS;  Service: General;  Laterality: N/A;    SOCIAL HISTORY: Social History   Socioeconomic History   Marital status: Married    Spouse name: Not on file   Number of children: Not on file   Years of education: Not on file   Highest education level: 5th grade  Occupational History   Not on file  Tobacco Use   Smoking status: Former    Types: Cigarettes    Passive exposure: Past   Smokeless tobacco: Never   Tobacco comments:    quit 30 years ago  Vaping Use   Vaping status: Never Used  Substance and Sexual Activity   Alcohol use: No    Alcohol/week: 0.0 standard drinks of alcohol   Drug use: No   Sexual activity: Not Currently  Other Topics Concern   Not on file  Social History Narrative   Not on file   Social Determinants of Health   Financial Resource Strain: High Risk (12/20/2022)   Overall Financial Resource Strain (CARDIA)    Difficulty of Paying Living Expenses: Hard  Food Insecurity: Food Insecurity Present (12/20/2022)   Hunger Vital Sign    Worried About Running Out of Food in the Last Year: Sometimes  true    Ran Out of Food in the Last Year: Patient declined  Transportation Needs: No Transportation Needs (12/20/2022)   PRAPARE - Administrator, Civil Service (Medical): No    Lack of Transportation (Non-Medical): No  Recent Concern: Transportation Needs - Unmet Transportation Needs (10/21/2022)   Received from [facility] - Transportation    In the past 12 months, has lack of transportation kept you from medical appointments or from getting medications?: No    Lack of Transportation (Non-Medical): Yes  Physical Activity: Unknown (07/26/2022)   Exercise Vital Sign    Days  of Exercise per Week: Patient declined    Minutes of Exercise per Session: 0 min  Stress: Stress Concern Present (07/26/2022)   [employer] of Occupational Health - Occupational Stress Questionnaire    Feeling of Stress : To some extent  Social Connections: Socially Integrated (12/20/2022)   Social Connection and Isolation Panel [NHANES]    Frequency of Communication with Friends and Family: More than three times a week    Frequency of Social Gatherings with Friends and Family: Patient declined    Attends Religious Services: More than 4 times per year    Active Member of [employer] or Organizations: Yes    Attends Banker Meetings: Never    Marital Status: Married  Catering manager Violence: Not At Risk (09/28/2022)   Humiliation, Afraid, Rape, and Kick questionnaire    Fear of Current or Ex-Partner: No    Emotionally Abused: No    Physically Abused: No    Sexually Abused: No    FAMILY HISTORY: Family History  Problem Relation Age of Onset   Cancer Maternal Aunt    Prostate cancer Neg Hx    Kidney disease Neg Hx    Kidney cancer Neg Hx    Bladder Cancer Neg Hx     ALLERGIES:  has No Known Allergies.  MEDICATIONS:  Current Outpatient Medications  Medication Sig Dispense Refill   aspirin EC 81 MG tablet Take 1 tablet (81 mg total) by mouth daily. Swallow  whole.     cyanocobalamin (VITAMIN B12) 1000 MCG tablet Take 1 tablet (1,000 mcg total) by mouth daily. 90 tablet 0   dapagliflozin propanediol (FARXIGA) 10 MG TABS tablet Take 1 tablet (10 mg total) by mouth daily before breakfast. 90 tablet 3   doxycycline (VIBRA-TABS) 100 MG tablet Take 100 mg by mouth 2 (two) times daily.     hydrALAZINE (APRESOLINE) 50 MG tablet Take 2 tablets (100 mg total) by mouth 2 (two) times daily. 180 tablet 3   KLOR-CON M20 20 MEQ tablet Take 20 mEq by mouth at bedtime.     levothyroxine (SYNTHROID) 200 MCG tablet Take 200 mcg by mouth daily before breakfast.     metFORMIN (GLUCOPHAGE) 500 MG tablet Take 2 tablets (1,000 mg total) by mouth 2 (two) times daily with a meal. 120 tablet 0   methocarbamol (ROBAXIN) 500 MG tablet Take 1 tablet (500 mg total) by mouth every 8 (eight) hours as needed for muscle spasms. 30 tablet 0   metoprolol succinate (TOPROL XL) 25 MG 24 hr tablet Take 1 tablet (25 mg total) by mouth daily. (Patient taking differently: Take 25 mg by mouth every morning.) 90 tablet 3   ondansetron (ZOFRAN-ODT) 4 MG disintegrating tablet Take 1 tablet (4 mg total) by mouth every 8 (eight) hours as needed for nausea or vomiting. 20 tablet 0   oxyCODONE-acetaminophen (PERCOCET) 10-325 MG tablet Take 1 tablet by mouth every 8 (eight) hours as needed. 90 tablet 0   pregabalin (LYRICA) 50 MG capsule Take 1 capsule (50 mg total) by mouth 3 (three) times daily for 14 days. 42 capsule 0   rosuvastatin (CRESTOR) 40 MG tablet Take 1 tablet (40 mg total) by mouth daily. (Patient taking differently: Take 40 mg by mouth every morning.) 90 tablet 3   sacubitril-valsartan (ENTRESTO) 24-26 MG Take 1 tablet by mouth 2 (two) times daily. 180 tablet 3   sildenafil (VIAGRA) 100 MG tablet Take 1 tablet (100 mg total) by mouth daily as needed for erectile dysfunction. Take two  hours prior to intercourse on an empty stomach 30 tablet 1   tamsulosin (FLOMAX) 0.4 MG CAPS capsule Take  0.4 mg by mouth daily.     zanubrutinib (BRUKINSA) 80 MG capsule Take 2 capsules (160 mg total) by mouth 2 (two) times daily. 120 capsule 2   No current facility-administered medications for this visit.    Review of Systems  Constitutional:  Positive for fatigue. Negative for appetite change, chills, fever and unexpected weight change.  HENT:   Negative for hearing loss and voice change.   Eyes:  Negative for eye problems and icterus.  Respiratory:  Negative for chest tightness, cough and shortness of breath.   Cardiovascular:  Negative for chest pain and leg swelling.  Gastrointestinal:  Negative for abdominal distention and abdominal pain.  Endocrine: Negative for hot flashes.  Genitourinary:  Negative for difficulty urinating, dysuria and frequency.   Musculoskeletal:  Positive for arthralgias.  Skin:  Negative for itching and rash.  Neurological:  Negative for light-headedness and numbness.  Hematological:  Negative for adenopathy. Does not bruise/bleed easily.  Psychiatric/Behavioral:  Negative for confusion.      PHYSICAL EXAMINATION: ECOG PERFORMANCE STATUS: 1 - Symptomatic but completely ambulatory  Vitals:   12/20/22 1324  BP: (!) 155/80  Pulse: 64  Resp: 18  Temp: (!) 96.3 F (35.7 C)   Filed Weights   12/20/22 1324  Weight: 216 lb 4.8 oz (98.1 kg)    Physical Exam Constitutional:      General: He is not in acute distress.    Appearance: He is obese. He is not diaphoretic.  HENT:     Head: Normocephalic and atraumatic.  Eyes:     General: No scleral icterus.    Pupils: Pupils are equal, round, and reactive to light.  Cardiovascular:     Rate and Rhythm: Normal rate.     Heart sounds: No murmur heard. Pulmonary:     Effort: Pulmonary effort is normal. No respiratory distress.     Breath sounds: No wheezing.  Abdominal:     General: There is no distension.     Palpations: Abdomen is soft.     Comments: Abd wound covered with dressing.    Musculoskeletal:     Cervical back: Normal range of motion and neck supple.  Skin:    General: Skin is warm and dry.     Findings: No erythema.  Neurological:     Mental Status: He is alert and oriented to person, place, and time. Mental status is at baseline.     Cranial Nerves: No cranial nerve deficit.     Motor: No abnormal muscle tone.  Psychiatric:        Mood and Affect: Mood and affect normal.      LABORATORY DATA:  I have reviewed the data as listed    Latest Ref Rng & Units 12/20/2022    1:03 PM 11/15/2022   12:55 PM 11/12/2022    6:33 AM  CBC  WBC 4.0 - 10.5 K/uL 10.3  5.5  11.1   Hemoglobin 13.0 - 17.0 g/dL 9.2  9.6  9.7   Hematocrit 39.0 - 52.0 % 29.1  30.8  30.9   Platelets 150 - 400 K/uL 162  184  169       Latest Ref Rng & Units 12/20/2022    1:04 PM 11/15/2022   12:55 PM 11/12/2022    6:33 AM  CMP  Glucose 70 - 99 mg/dL 960  95  454  BUN 8 - 23 mg/dL 17  13  14    Creatinine 0.61 - 1.24 mg/dL 1.61  0.96  0.45   Sodium 135 - 145 mmol/L 143  140  140   Potassium 3.5 - 5.1 mmol/L 3.7  3.6  3.4   Chloride 98 - 111 mmol/L 107  105  105   CO2 22 - 32 mmol/L 25  25  23    Calcium 8.9 - 10.3 mg/dL 9.0  8.7  8.6   Total Protein 6.5 - 8.1 g/dL 6.5  6.5  6.5   Total Bilirubin <1.2 mg/dL 0.8  1.0  1.2   Alkaline Phos 38 - 126 U/L 70  74  68   AST 15 - 41 U/L 62  34  34   ALT 0 - 44 U/L 35  23  20      RADIOGRAPHIC STUDIES: I have personally reviewed the radiological images as listed and agreed with the findings in the report. No results found.

## 2022-12-20 NOTE — Assessment & Plan Note (Addendum)
Labs are reviewed and discussed with patient. CT chest abdomen pelvis results reviewed and discussed with patient.   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 Counts are stable.  continue Zanubrutinib 160 mg twice daily

## 2022-12-20 NOTE — Assessment & Plan Note (Signed)
Encourage oral hydration and avoid nephrotoxins.   

## 2022-12-21 ENCOUNTER — Ambulatory Visit
Payer: No Typology Code available for payment source | Attending: Student in an Organized Health Care Education/Training Program | Admitting: Student in an Organized Health Care Education/Training Program

## 2022-12-21 ENCOUNTER — Telehealth: Payer: Self-pay

## 2022-12-21 ENCOUNTER — Encounter: Payer: Self-pay | Admitting: Student in an Organized Health Care Education/Training Program

## 2022-12-21 DIAGNOSIS — M5412 Radiculopathy, cervical region: Secondary | ICD-10-CM | POA: Insufficient documentation

## 2022-12-21 DIAGNOSIS — M5416 Radiculopathy, lumbar region: Secondary | ICD-10-CM | POA: Insufficient documentation

## 2022-12-21 DIAGNOSIS — M542 Cervicalgia: Secondary | ICD-10-CM | POA: Diagnosis not present

## 2022-12-21 DIAGNOSIS — G8929 Other chronic pain: Secondary | ICD-10-CM | POA: Diagnosis not present

## 2022-12-21 DIAGNOSIS — M48062 Spinal stenosis, lumbar region with neurogenic claudication: Secondary | ICD-10-CM | POA: Diagnosis not present

## 2022-12-21 DIAGNOSIS — Z79891 Long term (current) use of opiate analgesic: Secondary | ICD-10-CM | POA: Insufficient documentation

## 2022-12-21 DIAGNOSIS — I130 Hypertensive heart and chronic kidney disease with heart failure and stage 1 through stage 4 chronic kidney disease, or unspecified chronic kidney disease: Secondary | ICD-10-CM | POA: Insufficient documentation

## 2022-12-21 DIAGNOSIS — K5730 Diverticulosis of large intestine without perforation or abscess without bleeding: Secondary | ICD-10-CM | POA: Diagnosis not present

## 2022-12-21 DIAGNOSIS — G894 Chronic pain syndrome: Secondary | ICD-10-CM | POA: Insufficient documentation

## 2022-12-21 DIAGNOSIS — Z79899 Other long term (current) drug therapy: Secondary | ICD-10-CM | POA: Diagnosis not present

## 2022-12-21 MED ORDER — OXYCODONE-ACETAMINOPHEN 10-325 MG PO TABS: 1.0000 | ORAL_TABLET | Freq: Three times a day (TID) | ORAL | 0 refills | Status: DC | PRN

## 2022-12-21 NOTE — Telephone Encounter (Signed)
-----   Message from Paul Briggs sent at 12/20/2022  8:58 PM EST ----- Please arrange patient to get IV Venofer weekly x 2.  Thank you

## 2022-12-21 NOTE — Progress Notes (Signed)
PROVIDER NOTE: Information contained herein reflects review and annotations entered in association with encounter. Interpretation of such information and data should be left to medically-trained personnel. Information provided to patient can be located elsewhere in the medical record under "Patient Instructions". Document created using STT-dictation technology, any transcriptional errors that may result from process are unintentional.    Patient: Paul Ana [name]  Service Category: E/M  Provider: Edward Jolly, MD  DOB: 04/03/1945  DOS: 12/21/2022  Referring Provider: Cherlynn Polo  MRN: [id number]  Specialty: Interventional Pain Management  PCP: Debera Lat, PA-C  Type: Established Patient  Setting: Ambulatory outpatient    Location: Office  Delivery: Face-to-face     HPI  [name], a 77 y.o. year old male, is here today because of his No primary diagnosis found.. Mr. Mata primary complain today is Back Pain (lower)  Pertinent problems: Mr. Edge has Chronic back pain; Disorder of rotator cuff, left; Primary osteoarthritis of left shoulder; Left rotator cuff tear arthropathy; Spinal stenosis, lumbar region, with neurogenic claudication; Chronic left shoulder pain; Chronic pain syndrome; and Pain management contract signed on their pertinent problem list. Pain Assessment: Severity of Chronic pain is reported as a 6 /10. Location: Back Left, Right/pain radiaties up and down his leg. Onset: More than a month ago. Quality: Aching, Burning, Constant, Shooting, Throbbing. Timing: Constant. Modifying factor(s): meds and rest. Vitals:  height is 6\' 1"  (1.854 m) and weight is 221 lb (100.2 kg). His temperature is 97.3 F (36.3 C) (abnormal). His blood pressure is 161/95 (abnormal) and his pulse is 65. His oxygen saturation is 100%.  BMI: Estimated body mass index is 29.16 kg/m as calculated from the following:   Height as of this encounter: 6\' 1"  (1.854 m).    Weight as of this encounter: 221 lb (100.2 kg). Last encounter: 08/19/2022. Last procedure: 06/16/2022.  Reason for encounter: medication management.   No change in medical history since last visit.  Patient's pain is at baseline.  Patient continues multimodal pain regimen as prescribed.  States that it provides pain relief and improvement in functional status.   Pharmacotherapy Assessment  Analgesic: Percocet 10 mg 3 times daily as needed, quantity 90/month   Monitoring: [place] PMP: PDMP reviewed during this encounter.       Pharmacotherapy: No side-effects or adverse reactions reported. Compliance: No problems identified. Effectiveness: Clinically acceptable.  Brigitte Pulse, RN  12/21/2022 10:44 AM  Sign when Signing Visit Nursing Pain Medication Assessment:  Safety precautions to be maintained throughout the outpatient stay will include: orient to surroundings, keep bed in low position, maintain call bell within reach at all times, provide assistance with transfer out of bed and ambulation.  Medication Inspection Compliance: Pill count conducted under aseptic conditions, in front of the patient. Neither the pills nor the bottle was removed from the patient's sight at any time. Once count was completed pills were immediately returned to the patient in their original bottle.  Medication: Oxycodone/APAP Pill/Patch Count:  0 of 90 pills remain Pill/Patch Appearance: Markings consistent with prescribed medication Bottle Appearance: Standard pharmacy container. Clearly labeled. Filled Date: 13 / 28 / 2024 Last Medication intake:  TodaySafety precautions to be maintained throughout the outpatient stay will include: orient to surroundings, keep bed in low position, maintain call bell within reach at all times, provide assistance with transfer out of bed and ambulation.   No results found for: "CBDTHCR" No results found for: "D8THCCBX" No results found for: "D9THCCBX"  UDS:  Summary  Date  Value Ref Range Status  12/01/2021 Note  Final    Comment:    ==================================================================== [employer] Select 13 (MW) ==================================================================== Test                             Result       Flag       Units  Drug Present and Declared for Prescription Verification   Oxycodone                      998          EXPECTED   ng/mg creat   Oxymorphone                    739          EXPECTED   ng/mg creat   Noroxymorphone                 322          EXPECTED   ng/mg creat    Sources of oxycodone include scheduled prescription medications.    Oxymorphone and noroxymorphone are expected metabolites of oxycodone.    Oxymorphone is also available as a scheduled prescription    medication.  ==================================================================== Test                      Result    Flag   Units      Ref Range   Creatinine              220              mg/dL      >=16 ==================================================================== Declared Medications:  The flagging and interpretation on this report are based on the  following declared medications.  Unexpected results may arise from  inaccuracies in the declared medications.   **Note: The testing scope of this panel includes these medications:   Oxycodone (Percocet)   **Note: The testing scope of this panel does not include the  following reported medications:   Acetaminophen (Percocet)  Aspirin  Bisacodyl  Caffeine  Hydrochlorothiazide  Levothyroxine  Rosuvastatin  Supplement  Tamsulosin  Valsartan ==================================================================== For clinical consultation, please call [phone]. ====================================================================       ROS  Constitutional: Denies any fever or chills Gastrointestinal: No reported hemesis, hematochezia, vomiting, or acute GI  distress Musculoskeletal:  low back and bilateral leg pain Neurological: No reported episodes of acute onset apraxia, aphasia, dysarthria, agnosia, amnesia, paralysis, loss of coordination, or loss of consciousness  Medication Review  aspirin EC, cyanocobalamin, dapagliflozin propanediol, doxycycline, hydrALAZINE, levothyroxine, metFORMIN, methocarbamol, metoprolol succinate, ondansetron, oxyCODONE-acetaminophen, potassium chloride SA, pregabalin, rosuvastatin, sacubitril-valsartan, sildenafil, tamsulosin, and zanubrutinib  History Review  Allergy: Mr. Rosal has No Known Allergies. Drug: Mr. Siddons  reports no history of drug use. Alcohol:  reports no history of alcohol use. Tobacco:  reports that he has quit smoking. His smoking use included cigarettes. He has been exposed to tobacco smoke. He has never used smokeless tobacco. Social: Mr. Charon  reports that he has quit smoking. His smoking use included cigarettes. He has been exposed to tobacco smoke. He has never used smokeless tobacco. He reports that he does not drink alcohol and does not use drugs. 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), DDD (degenerative disc disease), cervical, Difficult airway (10/22/2015), Diverticulosis, DM (diabetes mellitus), type 2 (HCC), Dysplasia of prostate, Erectile dysfunction, Frequent falls, 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 Surgical: Mr. Mcaneny  has a past surgical history that includes Back surgery; Tonsillectomy; Esophagogastroduodenoscopy (egd) with propofol (N/A, 08/21/2022); biopsy (08/21/2022); Colonoscopy with propofol (N/A, 08/23/2022); Givens capsule study (N/A, 08/23/2022); polypectomy (08/23/2022); Submucosal tattoo injection (08/23/2022); biopsy (08/23/2022); Irrigation and debridement abscess (Left, 08/28/2022); Ines Bloomer hole of cranium (Left, 10/22/2015); Laparoscopic right colectomy (Right, 09/28/2022); and Ventral hernia repair (N/A, 09/28/2022). Family: family history includes Cancer in his maternal aunt.  Laboratory Chemistry Profile   Renal Lab Results  Component Value Date   BUN 17 12/20/2022   CREATININE 1.20 12/20/2022   BCR NOT APPLICABLE 07/06/2021   GFRAA >60 10/22/2015   GFRNONAA >60 12/20/2022    Hepatic Lab Results  Component Value Date   AST 62 (H) 12/20/2022   ALT 35 12/20/2022   ALBUMIN 3.6 12/20/2022   ALKPHOS 70 12/20/2022   HCVAB NON REACTIVE 12/26/2021   LIPASE 35 11/12/2022    Electrolytes Lab Results  Component Value Date   NA 143 12/20/2022   K 3.7 12/20/2022   CL 107 12/20/2022   CALCIUM 9.0 12/20/2022   MG 2.1 10/06/2022   PHOS 2.7 10/06/2022    Bone Lab Results  Component Value Date   VD25OH 30.27 05/21/2022   TESTOSTERONE 368 11/24/2022    Inflammation (CRP: Acute Phase) (ESR: Chronic Phase) Lab Results  Component Value Date   CRP 22.2 (H) 12/25/2021   ESRSEDRATE 3 12/25/2021   LATICACIDVEN 1.0 11/12/2022         Note: Above Lab results reviewed.  Recent Imaging Review  CT ABDOMEN PELVIS W CONTRAST CLINICAL DATA:  Six weeks postop from  right colectomy. Generalized abdominal pain that started yesterday.  EXAM: CT ABDOMEN AND PELVIS WITH CONTRAST  TECHNIQUE: Multidetector CT imaging of the abdomen and pelvis was performed using the standard protocol following bolus administration of intravenous contrast.  RADIATION DOSE REDUCTION: This exam was performed according to the departmental dose-optimization program which includes automated exposure control, adjustment of the mA and/or kV according to patient size and/or use of iterative reconstruction technique.  CONTRAST:  OMNIPAQUE IOHEXOL 300 MG/ML  SOLN  COMPARISON:  10/11/2022  FINDINGS: Lower chest: Cardiac enlargement and small low-density pericardial effusion. Left pleural effusion has resolved. No acute finding  Hepatobiliary: No focal liver abnormality.No evidence of biliary obstruction or stone.  Pancreas: 1 cm cystic density in the uncinate process of the pancreas, simple appearing. Two year follow-up recommendation on recent abdominal CT report.  Spleen: 15 mm lesion in the upper spleen, usually incidental in isolation and given stability since 01/04/2022. Would expect progression if this were related to patient's leukemia.  Adrenals/Urinary Tract: Known left adrenal adenoma measuring 2 cm. Multiple cystic densities in the bilateral kidney, the largest being on the right at 3.7 cm. No hydronephrosis or stone. Unremarkable bladder.  Stomach/Bowel: Right hemicolectomy. Mild low-density thickening at the anastomosis suggesting edema, possibly accentuated by incomplete distension. No ulceration or perforation detected. No bowel obstruction or ileus. Stool throughout much of the descending colon. Colonic diverticulosis.  Vascular/Lymphatic: Generalized atheromatous calcification of the aorta and iliacs. No mass or adenopathy.  Reproductive:No pathologic findings.  Other: Small volume ascites distributed throughout the abdomen is stable, no  encapsulation/abscess. There is some reticulation of the greater omentum, nonspecific after recent surgery.  Musculoskeletal: No acute finding. Generalized lumbar spine degeneration with multilevel mild listhesis.  IMPRESSION: 1. Recent right hemicolectomy. Some submucosal edema at the anastomosis but no perforation or obstruction. 2. Small volume ascites is stable. Decrease in left pleural effusion. 3. Chronic findings remain stable from prior and are described above.  Electronically Signed   By: Tiburcio Pea M.D.   On: 11/12/2022 08:12 Note: Reviewed        Physical Exam  General appearance: Well nourished, well developed, and well hydrated. In no apparent acute distress Mental status: Alert, oriented x 3 (person, place, & time)       Respiratory: No evidence of acute respiratory distress Eyes: PERLA Vitals: BP (!) 161/95   Pulse 65   Temp (!) 97.3 F (36.3 C)   Ht 6\' 1"  (1.854 m)   Wt 221 lb (100.2 kg)   SpO2 100%   BMI 29.16 kg/m  BMI: Estimated body mass index is 29.16 kg/m as calculated from the following:   Height as of this encounter: 6\' 1"  (1.854 m).   Weight as of this encounter: 221 lb (100.2 kg). Ideal: Ideal body weight: 79.9 kg (176 lb 2.4 oz) Adjusted ideal body weight: 88 kg (194 lb 1.4 oz)  Assessment   Diagnosis Status  1. Cervical radicular pain   2. Cervicalgia   3. Lumbar radicular pain   4. Spinal stenosis, lumbar region, with neurogenic claudication   5. Chronic radicular lumbar pain   6. Chronic pain syndrome   7. Pharmacologic therapy   8. Chronic use of opiate for therapeutic purpose   9. Encounter for medication management   10. Encounter for chronic pain management    Controlled Controlled Controlled     Plan of Care   [name] has a current medication list which includes the following long-term medication(s): hydralazine, levothyroxine, metformin, metoprolol succinate, sildenafil,  oxycodone-acetaminophen, [START ON 01/20/2023] oxycodone-acetaminophen, [START ON 02/19/2023] oxycodone-acetaminophen, pregabalin, and rosuvastatin.  Pharmacotherapy (Medications Ordered): Meds ordered this encounter  Medications   oxyCODONE-acetaminophen (PERCOCET) 10-325 MG tablet    Sig: Take 1 tablet by mouth every 8 (eight) hours as needed.    Dispense:  90 tablet    Refill:  0    DO NOT: delete (not duplicate); no partial-fill (will deny script to complete), no refill request (F/U required). DISPENSE: 1 day early if closed on fill date. WARN: No CNS-depressants within 8 hrs of med.   oxyCODONE-acetaminophen (PERCOCET) 10-325 MG tablet    Sig: Take 1 tablet by mouth every 8 (eight) hours as needed.    Dispense:  90 tablet    Refill:  0    DO NOT: delete (not duplicate); no partial-fill (will deny script to complete), no refill request (F/U required). DISPENSE: 1 day early if closed on fill date. WARN: No CNS-depressants within 8 hrs of med.   oxyCODONE-acetaminophen (PERCOCET) 10-325 MG tablet    Sig: Take 1 tablet by mouth every 8 (eight) hours as needed.    Dispense:  90 tablet  Refill:  0    DO NOT: delete (not duplicate); no partial-fill (will deny script to complete), no refill request (F/U required). DISPENSE: 1 day early if closed on fill date. WARN: No CNS-depressants within 8 hrs of med.   Orders:  Orders Placed This Encounter  Procedures   Tox[employer] Select 13 (MW), Urine    Volume: 30 ml(s). Minimum 3 ml of urine is needed. Document temperature of fresh sample. Indications: Long term (current) use of opiate analgesic (U27.253)    Order Specific Question:   Release to patient    Answer:   Immediate   Follow-up plan:   Return in about 3 months (around 03/23/2023) for MM, F2F.     Recent Visits Date Type Provider Dept  11/22/22 Office Visit Delano Metz, MD [facility]  Showing recent visits within past 90 days and meeting all other  requirements Today's Visits Date Type Provider Dept  12/21/22 Office Visit Edward Jolly, MD [facility]  Showing today's visits and meeting all other requirements Future Appointments No visits were found meeting these conditions. Showing future appointments within next 90 days and meeting all other requirements  I discussed the assessment and treatment plan with the patient. The patient was provided an opportunity to ask questions and all were answered. The patient agreed with the plan and demonstrated an understanding of the instructions.  Patient advised to call back or seek an in-person evaluation if the symptoms or condition worsens.  Duration of encounter: .  Total time on encounter, as per AMA guidelines included both the face-to-face and non-face-to-face time personally spent by the physician and/or other qualified health care professional(s) on the day of the encounter (includes time in activities that require the physician or other qualified health care professional and does not include time in activities normally performed by clinical staff). Physician's time may include the following activities when performed: Preparing to see the patient (e.g., pre-charting review of records, searching for previously ordered imaging, lab work, and nerve conduction tests) Review of prior analgesic pharmacotherapies. Reviewing PMP Interpreting ordered tests (e.g., lab work, imaging, nerve conduction tests) Performing post-procedure evaluations, including interpretation of diagnostic procedures Obtaining and/or reviewing separately obtained history Performing a medically appropriate examination and/or evaluation Counseling and educating the patient/family/caregiver Ordering medications, tests, or procedures Referring and communicating with other health care professionals (when not separately reported) Documenting clinical information in the electronic or other health  record Independently interpreting results (not separately reported) and communicating results to the patient/ family/caregiver Care coordination (not separately reported)  Note by: Edward Jolly, MD Date: 12/21/2022; Time: 11:13 AM

## 2022-12-21 NOTE — Progress Notes (Signed)
Nursing Pain Medication Assessment:  Safety precautions to be maintained throughout the outpatient stay will include: orient to surroundings, keep bed in low position, maintain call bell within reach at all times, provide assistance with transfer out of bed and ambulation.  Medication Inspection Compliance: Pill count conducted under aseptic conditions, in front of the patient. Neither the pills nor the bottle was removed from the patient's sight at any time. Once count was completed pills were immediately returned to the patient in their original bottle.  Medication: Oxycodone/APAP Pill/Patch Count:  0 of 90 pills remain Pill/Patch Appearance: Markings consistent with prescribed medication Bottle Appearance: Standard pharmacy container. Clearly labeled. Filled Date: 79 / 28 / 2024 Last Medication intake:  TodaySafety precautions to be maintained throughout the outpatient stay will include: orient to surroundings, keep bed in low position, maintain call bell within reach at all times, provide assistance with transfer out of bed and ambulation.

## 2022-12-22 ENCOUNTER — Encounter: Payer: Self-pay | Admitting: Family Medicine

## 2022-12-22 ENCOUNTER — Other Ambulatory Visit (HOSPITAL_COMMUNITY): Payer: Self-pay

## 2022-12-22 ENCOUNTER — Ambulatory Visit (INDEPENDENT_AMBULATORY_CARE_PROVIDER_SITE_OTHER): Payer: No Typology Code available for payment source | Admitting: Family Medicine

## 2022-12-22 ENCOUNTER — Other Ambulatory Visit (HOSPITAL_COMMUNITY): Payer: Self-pay | Admitting: Pharmacy Technician

## 2022-12-22 ENCOUNTER — Other Ambulatory Visit: Payer: Self-pay

## 2022-12-22 VITALS — BP 141/78 | HR 87 | Ht 73.0 in | Wt 216.0 lb

## 2022-12-22 DIAGNOSIS — I420 Dilated cardiomyopathy: Secondary | ICD-10-CM

## 2022-12-22 DIAGNOSIS — E039 Hypothyroidism, unspecified: Secondary | ICD-10-CM

## 2022-12-22 DIAGNOSIS — G6181 Chronic inflammatory demyelinating polyneuritis: Secondary | ICD-10-CM | POA: Diagnosis not present

## 2022-12-22 DIAGNOSIS — E119 Type 2 diabetes mellitus without complications: Secondary | ICD-10-CM

## 2022-12-22 DIAGNOSIS — C9110 Chronic lymphocytic leukemia of B-cell type not having achieved remission: Secondary | ICD-10-CM | POA: Diagnosis not present

## 2022-12-22 DIAGNOSIS — E279 Disorder of adrenal gland, unspecified: Secondary | ICD-10-CM

## 2022-12-22 DIAGNOSIS — N1831 Chronic kidney disease, stage 3a: Secondary | ICD-10-CM

## 2022-12-22 DIAGNOSIS — E1159 Type 2 diabetes mellitus with other circulatory complications: Secondary | ICD-10-CM | POA: Diagnosis not present

## 2022-12-22 DIAGNOSIS — Z7984 Long term (current) use of oral hypoglycemic drugs: Secondary | ICD-10-CM

## 2022-12-22 NOTE — Assessment & Plan Note (Signed)
Chronic, unknown Repeat A1c On ARB/ARNI, On Statin, On Farxiga 10, on Metformin 1000 mg BID

## 2022-12-22 NOTE — Assessment & Plan Note (Signed)
Chronic, unknown On 200 mcg synthroid  Repeat TSH

## 2022-12-22 NOTE — Progress Notes (Signed)
New patient visit   Patient: Paul Fels [name]   DOB: 06-11-1945   76 y.o. Male  MRN: [id number] Visit Date: 12/22/2022  Today's healthcare provider: Jacky Kindle, FNP  Patient presents for new patient visit to establish care.  Introduced to Publishing rights manager role and practice setting.  All questions answered.  Discussed provider/patient relationship and expectations.  Chief Complaint  Patient presents with   New Patient (Initial Visit)   Subjective    Paul Jeannette [name] is a 77 y.o. male who presents today as a new patient to establish care.   Past Medical History:  Diagnosis Date   Adenoma of left adrenal gland    Anemia    Aortic atherosclerosis (HCC)    Blood transfusion without reported diagnosis    BPH (benign prostatic hyperplasia)    Bradycardia    CAD (coronary artery disease) 08/26/2022   a.) cCTA 08/26/2022: Ca2+ 14.3 (12th %'ile for age/sex.race match control); (<25%) pLAD   Cellulitis of left thigh 08/2022   CHF (congestive heart failure) (HCC)    a.) TTE 10/23/2015: EF 60-65%, mod-sev LVH, mild biatrial dil, degen MV disease, AoV sclerosis, asc Ao 38 mm, G1DD; b.) TTE 08/05/2022: EF 45-50%, mod-sev LVH (speckled pattern), sev biatrial dil, mild MR, AoV sclerosis, G1DD   Chronic pain syndrome    a.) on COT managed by pain management   Chronic, continuous use of opioids    a.) chronic pain syndrome/chronic back pain; managed by pain management   CKD (chronic kidney disease) stage 3, GFR 30-59 ml/min (HCC)    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   a.) Rai stage III   DDD (degenerative disc disease), cervical    Difficult airway 10/22/2015   a.) 1st attempt with glidescope  --> macroglossic (unsuccessful); b.) 2nd attempt with Mac 4 and direct laryngoscopy with a 8.0 ETT --> tube too large to pass through the cords; b.) 3rd attempt (successful) with a Mac 4 and a 7.5 tube   Diverticulosis    DM (diabetes mellitus), type 2 (HCC)     Dysplasia of prostate    Erectile dysfunction    Frequent falls    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   History of MRSA infection 08/28/2022   a.) MRSA PCR (+) 08/28/2022; culture from LEFT thigh abscess   HTN (hypertension)    Hyperlipidemia    Hypogonadism in male    Hypothyroidism    IDA (iron deficiency anemia)    LBBB (left bundle branch block)    Long-term use of aspirin therapy    Lumbar spinal stenosis    Mild cardiomegaly    Myocardial infarction (HCC)    Nodule of right lung    Orchitis and epididymitis 06/16/2013   OSA on CPAP    Palindromic rheumatism, hand    Pericardial effusion    Prostatitis    Sepsis (HCC) 08/2022   Subdural hematoma (HCC) 10/22/2015   a.) s/p traumatic mechanical fall --> CT head 10/22/2015: high-density SDH the left cerebral convexity with13 mm midline shift --> s/p LEFT frontal burr hole craniotomy   Tubular adenoma of colon    Ventral hernia    Past Surgical History:  Procedure Laterality Date   BACK SURGERY     BIOPSY  08/21/2022  Procedure: BIOPSY;  Surgeon: Jaynie Collins, DO;  Location: [facility] ENDOSCOPY;  Service: Gastroenterology;;   BIOPSY  08/23/2022   Procedure: BIOPSY;  Surgeon: Jaynie Collins, DO;  Location: [facility] ENDOSCOPY;  Service: Gastroenterology;;   Guss Bunde OF CRANIUM Left 10/22/2015   COLON SURGERY  09/28/2022   COLONOSCOPY WITH PROPOFOL N/A 08/23/2022   Procedure: COLONOSCOPY WITH PROPOFOL;  Surgeon: Jaynie Collins, DO;  Location: [facility] ENDOSCOPY;  Service: Gastroenterology;  Laterality: N/A;   ESOPHAGOGASTRODUODENOSCOPY (EGD) WITH PROPOFOL N/A 08/21/2022   Procedure: ESOPHAGOGASTRODUODENOSCOPY (EGD) WITH PROPOFOL;  Surgeon: Jaynie Collins, DO;  Location: [facility]  ENDOSCOPY;  Service: Gastroenterology;  Laterality: N/A;   GIVENS CAPSULE STUDY N/A 08/23/2022   Procedure: GIVENS CAPSULE STUDY;  Surgeon: Jaynie Collins, DO;  Location: [facility] ENDOSCOPY;  Service: Gastroenterology;  Laterality: N/A;   IRRIGATION AND DEBRIDEMENT ABSCESS Left 08/28/2022   Procedure: IRRIGATION AND DEBRIDEMENT ABSCESS LEFT UPPER THIGH/GROIN;  Surgeon: Leafy Ro, MD;  Location: [facility] ORS;  Service: General;  Laterality: Left;   LAPAROSCOPIC RIGHT COLECTOMY Right 09/28/2022   Procedure: LAPAROSCOPIC RIGHT COLECTOMY, RNFA to assist;  Surgeon: Leafy Ro, MD;  Location: [facility] ORS;  Service: General;  Laterality: Right;   POLYPECTOMY  08/23/2022   Procedure: POLYPECTOMY;  Surgeon: Jaynie Collins, DO;  Location: [facility] ENDOSCOPY;  Service: Gastroenterology;;   SUBMUCOSAL TATTOO INJECTION  08/23/2022   Procedure: SUBMUCOSAL TATTOO INJECTION;  Surgeon: Jaynie Collins, DO;  Location: [facility] ENDOSCOPY;  Service: Gastroenterology;;   TONSILLECTOMY     VENTRAL HERNIA REPAIR N/A 09/28/2022   Procedure: HERNIA REPAIR VENTRAL ADULT;  Surgeon: Leafy Ro, MD;  Location: [facility] ORS;  Service: General;  Laterality: N/A;   Family Status  Relation Name Status   Mother  Deceased   Father  Deceased   Mat Aunt  (Not Specified)   Daughter Rebeca Alert (Not Specified)   Son Tyrique Petrich and Evette Cristal (Not Specified)   Neg Hx  (Not Specified)  No partnership data on file   Family History  Problem Relation Age of Onset   Cancer Maternal Aunt    ADD / ADHD Daughter    Asthma Son    Prostate cancer Neg Hx    Kidney disease Neg Hx    Kidney cancer Neg Hx    Bladder Cancer Neg Hx    Social History   Socioeconomic History   Marital status: Married    Spouse name: Not on file   Number of children: Not on file   Years of education: Not on file   Highest education level: 5th grade  Occupational History   Not on file  Tobacco Use   Smoking status: Former    Types:  Cigarettes    Passive exposure: Past   Smokeless tobacco: Never   Tobacco comments:    Stop smoking iver 40  plus years ago  Vaping Use   Vaping status: Never Used  Substance and Sexual Activity   Alcohol use: No   Drug use: No   Sexual activity: Not Currently  Other Topics Concern   Not on file  Social History Narrative   Not on file   Social Determinants of Health   Financial Resource Strain: High Risk (12/20/2022)   Overall Financial Resource Strain (CARDIA)    Difficulty of Paying Living Expenses: Hard  Food Insecurity: Food Insecurity Present (12/20/2022)   Hunger Vital Sign    Worried About Running Out of Food in the Last Year: Sometimes true  Ran Out of Food in the Last Year: Patient declined  Transportation Needs: No Transportation Needs (12/20/2022)   PRAPARE - Administrator, Civil Service (Medical): No    Lack of Transportation (Non-Medical): No  Recent Concern: Transportation Needs - Unmet Transportation Needs (10/21/2022)   Received from [facility] - Transportation    In the past 12 months, has lack of transportation kept you from medical appointments or from getting medications?: No    Lack of Transportation (Non-Medical): Yes  Physical Activity: Unknown (07/26/2022)   Exercise Vital Sign    Days of Exercise per Week: Patient declined    Minutes of Exercise per Session: 0 min  Stress: Stress Concern Present (07/26/2022)   [employer] of Occupational Health - Occupational Stress Questionnaire    Feeling of Stress : To some extent  Social Connections: Socially Integrated (12/20/2022)   Social Connection and Isolation Panel [NHANES]    Frequency of Communication with Friends and Family: More than three times a week    Frequency of Social Gatherings with Friends and Family: Patient declined    Attends Religious Services: More than 4 times per year    Active Member of [employer] or Organizations: Yes    Attends Tax inspector Meetings: Never    Marital Status: Married   Outpatient Medications Prior to Visit  Medication Sig   aspirin EC 81 MG tablet Take 1 tablet (81 mg total) by mouth daily. Swallow whole.   cyanocobalamin (VITAMIN B12) 1000 MCG tablet Take 1 tablet (1,000 mcg total) by mouth daily.   dapagliflozin propanediol (FARXIGA) 10 MG TABS tablet Take 1 tablet (10 mg total) by mouth daily before breakfast.   doxycycline (VIBRA-TABS) 100 MG tablet Take 100 mg by mouth 2 (two) times daily.   hydrALAZINE (APRESOLINE) 50 MG tablet Take 2 tablets (100 mg total) by mouth 2 (two) times daily.   levothyroxine (SYNTHROID) 200 MCG tablet Take 200 mcg by mouth daily before breakfast.   metFORMIN (GLUCOPHAGE) 500 MG tablet Take 2 tablets (1,000 mg total) by mouth 2 (two) times daily with a meal.   methocarbamol (ROBAXIN) 500 MG tablet Take 1 tablet (500 mg total) by mouth every 8 (eight) hours as needed for muscle spasms.   metoprolol succinate (TOPROL XL) 25 MG 24 hr tablet Take 1 tablet (25 mg total) by mouth daily. (Patient taking differently: Take 25 mg by mouth every morning.)   oxyCODONE-acetaminophen (PERCOCET) 10-325 MG tablet Take 1 tablet by mouth every 8 (eight) hours as needed.   [START ON 01/20/2023] oxyCODONE-acetaminophen (PERCOCET) 10-325 MG tablet Take 1 tablet by mouth every 8 (eight) hours as needed.   [START ON 02/19/2023] oxyCODONE-acetaminophen (PERCOCET) 10-325 MG tablet Take 1 tablet by mouth every 8 (eight) hours as needed.   sacubitril-valsartan (ENTRESTO) 24-26 MG Take 1 tablet by mouth 2 (two) times daily.   sildenafil (VIAGRA) 100 MG tablet Take 1 tablet (100 mg total) by mouth daily as needed for erectile dysfunction. Take two hours prior to intercourse on an empty stomach   KLOR-CON M20 20 MEQ tablet Take 20 mEq by mouth at bedtime. (Patient not taking: Reported on 12/22/2022)   ondansetron (ZOFRAN-ODT) 4 MG disintegrating tablet Take 1 tablet (4 mg total) by mouth every 8  (eight) hours as needed for nausea or vomiting. (Patient not taking: Reported on 12/22/2022)   pregabalin (LYRICA) 50 MG capsule Take 1 capsule (50 mg total) by mouth 3 (three) times daily for 14  days.   rosuvastatin (CRESTOR) 40 MG tablet Take 1 tablet (40 mg total) by mouth daily. (Patient taking differently: Take 40 mg by mouth every morning.)   tamsulosin (FLOMAX) 0.4 MG CAPS capsule Take 0.4 mg by mouth daily. (Patient not taking: Reported on 12/22/2022)   zanubrutinib (BRUKINSA) 80 MG capsule Take 2 capsules (160 mg total) by mouth 2 (two) times daily. (Patient not taking: Reported on 12/22/2022)   No facility-administered medications prior to visit.   No Known Allergies  Immunization History  Administered Date(s) Administered   Pneumococcal Conjugate-13 07/13/2010   Pneumococcal Polysaccharide-23 11/06/2014   Td 07/13/2010   Tdap 07/13/2010    Health Maintenance  Topic Date Due   COVID-19 Vaccine (1) Never done   FOOT EXAM  Never done   OPHTHALMOLOGY EXAM  Never done   Diabetic kidney evaluation - Urine ACR  Never done   Zoster Vaccines- Shingrix (1 of 2) Never done   DTaP/Tdap/Td (3 - Td or Tdap) 07/12/2020   Medicare Annual Wellness (AWV)  07/07/2022   INFLUENZA VACCINE  04/25/2023 (Originally 08/26/2022)   HEMOGLOBIN A1C  02/21/2023   Diabetic kidney evaluation - eGFR measurement  12/20/2023   Pneumonia Vaccine 80+ Years old  Completed   Hepatitis C Screening  Completed   HPV VACCINES  Aged Out   Colonoscopy  Discontinued    Patient Care Team: Jacky Kindle, FNP as PCP - General (Family Medicine) Debbe Odea, MD as PCP - Cardiology (Cardiology) [name], MD as Consulting Physician (Oncology)  Review of Systems  Last CBC Lab Results  Component Value Date   WBC 10.3 12/20/2022   HGB 9.2 (L) 12/20/2022   HCT 29.1 (L) 12/20/2022   MCV 91.8 12/20/2022   MCH 29.0 12/20/2022   RDW 18.6 (H) 12/20/2022   PLT 162 12/20/2022   Last metabolic panel Lab  Results  Component Value Date   GLUCOSE 115 (H) 12/20/2022   NA 143 12/20/2022   K 3.7 12/20/2022   CL 107 12/20/2022   CO2 25 12/20/2022   BUN 17 12/20/2022   CREATININE 1.20 12/20/2022   GFRNONAA >60 12/20/2022   CALCIUM 9.0 12/20/2022   PHOS 2.7 10/06/2022   PROT 6.5 12/20/2022   ALBUMIN 3.6 12/20/2022   BILITOT 0.8 12/20/2022   ALKPHOS 70 12/20/2022   AST 62 (H) 12/20/2022   ALT 35 12/20/2022   ANIONGAP 11 12/20/2022   Last lipids Lab Results  Component Value Date   CHOL 181 07/06/2021   HDL 61 07/06/2021   LDLCALC 105 (H) 07/06/2021   LDLDIRECT 84 10/29/2022   TRIG 60 07/06/2021   CHOLHDL 3.0 07/06/2021   Last hemoglobin A1c Lab Results  Component Value Date   HGBA1C 6.4 (H) 08/21/2022   Last thyroid functions Lab Results  Component Value Date   TSH 2.16 07/06/2021   Last vitamin D Lab Results  Component Value Date   VD25OH 30.27 05/21/2022   Last vitamin B12 and Folate Lab Results  Component Value Date   VITAMINB12 519 11/15/2022   FOLATE 13.3 11/15/2022    Objective    BP (!) 141/78   Pulse 87   Ht 6\' 1"  (1.854 m)   Wt 216 lb (98 kg)   SpO2 97%   BMI 28.50 kg/m   BP Readings from Last 3 Encounters:  12/22/22 (!) 141/78  12/21/22 (!) 161/95  12/20/22 (!) 155/80   Wt Readings from Last 3 Encounters:  12/22/22 216 lb (98 kg)  12/21/22 221 lb (100.2  kg)  12/20/22 216 lb 4.8 oz (98.1 kg)   SpO2 Readings from Last 3 Encounters:  12/22/22 97%  12/21/22 100%  11/22/22 99%    {See vitals history (optional) Physical Exam Vitals and nursing note reviewed.  Constitutional:      Appearance: Normal appearance. He is overweight.  HENT:     Head: Normocephalic and atraumatic.  Cardiovascular:     Rate and Rhythm: Normal rate and regular rhythm.     Pulses: Normal pulses.     Heart sounds: Normal heart sounds.  Pulmonary:     Effort: Pulmonary effort is normal.     Breath sounds: Normal breath sounds.  Musculoskeletal:        General:  Normal range of motion.     Cervical back: Normal range of motion.  Skin:    General: Skin is warm and dry.     Capillary Refill: Capillary refill takes less than 2 seconds.  Neurological:     General: No focal deficit present.     Mental Status: He is alert and oriented to person, place, and time. Mental status is at baseline.  Psychiatric:        Mood and Affect: Mood normal.        Behavior: Behavior normal.        Thought Content: Thought content normal.        Judgment: Judgment normal.    Depression Screen    12/22/2022    1:48 PM 08/19/2022   11:21 AM 06/16/2022   11:08 AM 02/25/2022   11:00 AM  PHQ 2/9 Scores  PHQ - 2 Score 0 0 0 0  PHQ- 9 Score 0      No results found for any visits on 12/22/22.  Assessment & Plan      Problem List Items Addressed This Visit       Cardiovascular and Mediastinum   Cardiomyopathy (HCC)    Chronic, followed by cardiology Repeat labs today Continues on BB toprol xl 25 mg with hydral 50 mg BID and farxiga 10 with entresto 24-26      Relevant Orders   CBC with Differential/Platelet   Comprehensive Metabolic Panel (CMET)   TSH   Hemoglobin A1c   PSA   Iron, TIBC and Ferritin Panel   Lipid panel   Microalbumin / creatinine urine ratio     Endocrine   Diabetes mellitus (HCC)    Chronic, unknown Repeat A1c On ARB/ARNI, On Statin, On Farxiga 10, on Metformin 1000 mg BID      Relevant Orders   CBC with Differential/Platelet   Comprehensive Metabolic Panel (CMET)   TSH   Hemoglobin A1c   PSA   Iron, TIBC and Ferritin Panel   Lipid panel   Microalbumin / creatinine urine ratio   Hypothyroidism    Chronic, unknown On 200 mcg synthroid  Repeat TSH       Relevant Orders   CBC with Differential/Platelet   Comprehensive Metabolic Panel (CMET)   TSH   Hemoglobin A1c   PSA   Iron, TIBC and Ferritin Panel   Lipid panel   Microalbumin / creatinine urine ratio     Nervous and Auditory   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    Chronic, unknown Previously followed by oncology Repeat CBC         Genitourinary   CKD (chronic kidney disease) stage 3, GFR 30-59 ml/min (HCC) (Chronic)    Chronic, followed by oncology Borderline HTN Recommend labs  Relevant Orders   CBC with Differential/Platelet   Comprehensive Metabolic Panel (CMET)   TSH   Hemoglobin A1c   PSA   Iron, TIBC and Ferritin Panel   Lipid panel   Microalbumin / creatinine urine ratio     Other   CLL (chronic lymphocytic leukemia) (HCC) (Chronic)    Chronic, unknown followed by oncology Repeat CBC      Relevant Orders   CBC with Differential/Platelet   Comprehensive Metabolic Panel (CMET)   TSH   Hemoglobin A1c   PSA   Iron, TIBC and Ferritin Panel   Lipid panel   Microalbumin / creatinine urine ratio   Adrenal nodule (HCC)    Chronic, unknown Continue to monitor at regular intervals; discuss at 3 month f/u      No follow-ups on file.    Leilani Merl, FNP, have reviewed all documentation for this visit. The documentation on 12/22/22 for the exam, diagnosis, procedures, and orders are all accurate and complete.  Jacky Kindle, FNP  [facility] [phone] (phone) [phone] (fax)  [facility]

## 2022-12-22 NOTE — Assessment & Plan Note (Signed)
Chronic, unknown Continue to monitor at regular intervals; discuss at 3 month f/u

## 2022-12-22 NOTE — Assessment & Plan Note (Signed)
Chronic, followed by cardiology Repeat labs today Continues on BB toprol xl 25 mg with hydral 50 mg BID and farxiga 10 with entresto 24-26

## 2022-12-22 NOTE — Progress Notes (Signed)
Specialty Pharmacy Refill Coordination Note  Paul Briggs. is a 77 y.o. male contacted today regarding refills of specialty medication(s) Zanubrutinib   Patient requested Delivery   Delivery date: 12/28/22   Verified address: [address]  [place]   Medication will be filled on 12/27/22.

## 2022-12-22 NOTE — Assessment & Plan Note (Signed)
Chronic, unknown followed by oncology Repeat CBC

## 2022-12-22 NOTE — Assessment & Plan Note (Signed)
Chronic, unknown Previously followed by oncology Repeat CBC

## 2022-12-22 NOTE — Assessment & Plan Note (Signed)
Chronic, followed by oncology Borderline HTN Recommend labs

## 2022-12-23 LAB — CBC WITH DIFFERENTIAL/PLATELET
Basophils Absolute: 0.1 10*3/uL (ref 0.0–0.2)
Basos: 1 %
EOS (ABSOLUTE): 0.2 10*3/uL (ref 0.0–0.4)
Eos: 2 %
Hematocrit: 29.5 % — ABNORMAL LOW (ref 37.5–51.0)
Hemoglobin: 9.1 g/dL — ABNORMAL LOW (ref 13.0–17.7)
Immature Grans (Abs): 0 10*3/uL (ref 0.0–0.1)
Immature Granulocytes: 0 %
Lymphocytes Absolute: 5.7 10*3/uL — ABNORMAL HIGH (ref 0.7–3.1)
Lymphs: 57 %
MCH: 28 pg (ref 26.6–33.0)
MCHC: 30.8 g/dL — ABNORMAL LOW (ref 31.5–35.7)
MCV: 91 fL (ref 79–97)
Monocytes Absolute: 0.5 10*3/uL (ref 0.1–0.9)
Monocytes: 5 %
Neutrophils Absolute: 3.4 10*3/uL (ref 1.4–7.0)
Neutrophils: 35 %
Platelets: 150 10*3/uL (ref 150–450)
RBC: 3.25 x10E6/uL — ABNORMAL LOW (ref 4.14–5.80)
RDW: 16.6 % — ABNORMAL HIGH (ref 11.6–15.4)
WBC: 9.7 10*3/uL (ref 3.4–10.8)

## 2022-12-23 LAB — IRON,TIBC AND FERRITIN PANEL
Ferritin: 45 ng/mL (ref 30–400)
Iron Saturation: 14 % — ABNORMAL LOW (ref 15–55)
Iron: 36 ug/dL — ABNORMAL LOW (ref 38–169)
Total Iron Binding Capacity: 251 ug/dL (ref 250–450)
UIBC: 215 ug/dL (ref 111–343)

## 2022-12-23 LAB — COMPREHENSIVE METABOLIC PANEL
ALT: 40 [IU]/L (ref 0–44)
AST: 74 [IU]/L — ABNORMAL HIGH (ref 0–40)
Albumin: 4.1 g/dL (ref 3.8–4.8)
Alkaline Phosphatase: 87 [IU]/L (ref 44–121)
BUN/Creatinine Ratio: 11 (ref 10–24)
BUN: 12 mg/dL (ref 8–27)
Bilirubin Total: 0.4 mg/dL (ref 0.0–1.2)
CO2: 25 mmol/L (ref 20–29)
Calcium: 8.8 mg/dL (ref 8.6–10.2)
Chloride: 109 mmol/L — ABNORMAL HIGH (ref 96–106)
Creatinine, Ser: 1.09 mg/dL (ref 0.76–1.27)
Globulin, Total: 2.1 g/dL (ref 1.5–4.5)
Glucose: 99 mg/dL (ref 70–99)
Potassium: 3.8 mmol/L (ref 3.5–5.2)
Sodium: 146 mmol/L — ABNORMAL HIGH (ref 134–144)
Total Protein: 6.2 g/dL (ref 6.0–8.5)
eGFR: 70 mL/min/{1.73_m2} (ref >59)

## 2022-12-23 LAB — PSA: Prostate Specific Ag, Serum: 0.3 ng/mL (ref 0.0–4.0)

## 2022-12-23 LAB — LIPID PANEL
Chol/HDL Ratio: 1.9 {ratio} (ref 0.0–5.0)
Cholesterol, Total: 134 mg/dL (ref 100–199)
HDL: 72 mg/dL (ref >39)
LDL Chol Calc (NIH): 47 mg/dL (ref 0–99)
Triglycerides: 74 mg/dL (ref 0–149)
VLDL Cholesterol Cal: 15 mg/dL (ref 5–40)

## 2022-12-23 LAB — HEMOGLOBIN A1C
Est. average glucose Bld gHb Est-mCnc: 123 mg/dL
Hgb A1c MFr Bld: 5.9 % — ABNORMAL HIGH (ref 4.8–5.6)

## 2022-12-23 LAB — TSH: TSH: 27.3 u[IU]/mL — ABNORMAL HIGH (ref 0.450–4.500)

## 2022-12-26 LAB — TOXASSURE SELECT 13 (MW), URINE

## 2022-12-27 ENCOUNTER — Other Ambulatory Visit: Payer: Self-pay

## 2022-12-27 ENCOUNTER — Other Ambulatory Visit: Payer: Self-pay | Admitting: Family Medicine

## 2022-12-27 ENCOUNTER — Encounter: Payer: Self-pay | Admitting: Surgery

## 2022-12-27 ENCOUNTER — Ambulatory Visit (INDEPENDENT_AMBULATORY_CARE_PROVIDER_SITE_OTHER): Payer: No Typology Code available for payment source | Admitting: Surgery

## 2022-12-27 VITALS — BP 164/75 | HR 66 | Temp 98.0°F | Ht 73.0 in | Wt 214.8 lb

## 2022-12-27 DIAGNOSIS — Z09 Encounter for follow-up examination after completed treatment for conditions other than malignant neoplasm: Secondary | ICD-10-CM

## 2022-12-27 DIAGNOSIS — K6389 Other specified diseases of intestine: Secondary | ICD-10-CM

## 2022-12-27 MED ORDER — LEVOTHYROXINE SODIUM 200 MCG PO TABS: 200.0000 ug | ORAL_TABLET | Freq: Every day | ORAL | 0 refills | Status: AC

## 2022-12-27 NOTE — Progress Notes (Unsigned)
12/27/22 11:01 AM   Paul Ana [name] 1945-12-22 [id number]  Referring provider:  Debera Lat, PA-C [address] [place],  [place] [address]  Urological history  1. ED -contributing factors of age, BPH, spinal injury, brain injury, DM, HTN, HLD and sleep apnea -testosterone level (10/2022) 368 -PDE5i's intolerable    2. BPH with LU TS -tamsulosin 0.4 mg   3. Prostate cancer screening -PSA (11/2022) 0.3 -maternal uncle with fatal prostate cancer  4. Renal cysts -contrast CT (10/2022) - Multiple cystic densities in the bilateral kidney, the largest being on the right at 3.7 cm.   No chief complaint on file.   HPI: Paul Laffitte [name] is a 77 y.o.male who presents today for one month follow up after a trial of sildenafil 100 mg, on-demand-dosing  Previous records reviewed.    SHIM ***     Score: 1-7 Severe ED 8-11 Moderate ED 12-16 Mild-Moderate ED 17-21 Mild ED 22-25 No ED    PMH: Past Medical History:  Diagnosis Date   Adenoma of left adrenal gland    Anemia    Aortic atherosclerosis (HCC)    Blood transfusion without reported diagnosis    BPH (benign prostatic hyperplasia)    Bradycardia    CAD (coronary artery disease) 08/26/2022   a.) cCTA 08/26/2022: Ca2+ 14.3 (12th %'ile for age/sex.race match control); (<25%) pLAD   Cellulitis of left thigh 08/2022   CHF (congestive heart failure) (HCC)    a.) TTE 10/23/2015: EF 60-65%, mod-sev LVH, mild biatrial dil, degen MV disease, AoV sclerosis, asc Ao 38 mm, G1DD; b.) TTE 08/05/2022: EF 45-50%, mod-sev LVH (speckled pattern), sev biatrial dil, mild MR, AoV sclerosis, G1DD   Chronic pain syndrome    a.) on COT managed by pain management   Chronic, continuous use of opioids    a.) chronic pain syndrome/chronic back pain; managed by pain management   CKD (chronic kidney disease) stage 3, GFR 30-59 ml/min (HCC)    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   a.) Rai stage  III   DDD (degenerative disc disease), cervical    Difficult airway 10/22/2015   a.) 1st attempt with glidescope  --> macroglossic (unsuccessful); b.) 2nd attempt with Mac 4 and direct laryngoscopy with a 8.0 ETT --> tube too large to pass through the cords; b.) 3rd attempt (successful) with a Mac 4 and a 7.5 tube   Diverticulosis    DM (diabetes mellitus), type 2 (HCC)    Dysplasia of prostate    Erectile dysfunction    Frequent falls    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   History of MRSA infection 08/28/2022   a.) MRSA PCR (+) 08/28/2022; culture from LEFT thigh abscess   HTN (hypertension)    Hyperlipidemia    Hypogonadism in male    Hypothyroidism    IDA (iron deficiency anemia)    LBBB (left bundle branch block)    Long-term use of aspirin therapy    Lumbar spinal stenosis    Mild cardiomegaly    Myocardial infarction (HCC)    Nodule of right lung    Orchitis and epididymitis 06/16/2013   OSA on CPAP    Palindromic rheumatism, hand    Pericardial effusion    Prostatitis    Sepsis (HCC) 08/2022  Subdural hematoma (HCC) 10/22/2015   a.) s/p traumatic mechanical fall --> CT head 10/22/2015: high-density SDH the left cerebral convexity with13 mm midline shift --> s/p LEFT frontal burr hole craniotomy   Tubular adenoma of colon    Ventral hernia     Surgical History: Past Surgical History:  Procedure Laterality Date   BACK SURGERY     BIOPSY  08/21/2022   Procedure: BIOPSY;  Surgeon: Jaynie Collins, DO;  Location: [facility] ENDOSCOPY;  Service: Gastroenterology;;   BIOPSY  08/23/2022   Procedure: BIOPSY;  Surgeon: Jaynie Collins, DO;  Location: [facility] ENDOSCOPY;  Service: Gastroenterology;;   Guss Bunde OF CRANIUM Left 10/22/2015    COLON SURGERY  09/28/2022   COLONOSCOPY WITH PROPOFOL N/A 08/23/2022   Procedure: COLONOSCOPY WITH PROPOFOL;  Surgeon: Jaynie Collins, DO;  Location: [facility] ENDOSCOPY;  Service: Gastroenterology;  Laterality: N/A;   ESOPHAGOGASTRODUODENOSCOPY (EGD) WITH PROPOFOL N/A 08/21/2022   Procedure: ESOPHAGOGASTRODUODENOSCOPY (EGD) WITH PROPOFOL;  Surgeon: Jaynie Collins, DO;  Location: [facility] ENDOSCOPY;  Service: Gastroenterology;  Laterality: N/A;   GIVENS CAPSULE STUDY N/A 08/23/2022   Procedure: GIVENS CAPSULE STUDY;  Surgeon: Jaynie Collins, DO;  Location: [facility] ENDOSCOPY;  Service: Gastroenterology;  Laterality: N/A;   IRRIGATION AND DEBRIDEMENT ABSCESS Left 08/28/2022   Procedure: IRRIGATION AND DEBRIDEMENT ABSCESS LEFT UPPER THIGH/GROIN;  Surgeon: Leafy Ro, MD;  Location: [facility] ORS;  Service: General;  Laterality: Left;   LAPAROSCOPIC RIGHT COLECTOMY Right 09/28/2022   Procedure: LAPAROSCOPIC RIGHT COLECTOMY, RNFA to assist;  Surgeon: Leafy Ro, MD;  Location: [facility] ORS;  Service: General;  Laterality: Right;   POLYPECTOMY  08/23/2022   Procedure: POLYPECTOMY;  Surgeon: Jaynie Collins, DO;  Location: [facility] ENDOSCOPY;  Service: Gastroenterology;;   SUBMUCOSAL TATTOO INJECTION  08/23/2022   Procedure: SUBMUCOSAL TATTOO INJECTION;  Surgeon: Jaynie Collins, DO;  Location: [facility] ENDOSCOPY;  Service: Gastroenterology;;   TONSILLECTOMY     VENTRAL HERNIA REPAIR N/A 09/28/2022   Procedure: HERNIA REPAIR VENTRAL ADULT;  Surgeon: Leafy Ro, MD;  Location: [facility] ORS;  Service: General;  Laterality: N/A;    Home Medications:  Allergies as of 12/29/2022   No Known Allergies      Medication List        Accurate as of December 27, 2022 11:01 AM. If you have any questions, ask your nurse or doctor.          aspirin EC 81 MG tablet Take 1 tablet (81 mg total) by mouth daily. Swallow whole.   Brukinsa 80 MG capsule Generic drug: zanubrutinib Take 2 capsules  (160 mg total) by mouth 2 (two) times daily.   cyanocobalamin 1000 MCG tablet Commonly known as: VITAMIN B12 Take 1 tablet (1,000 mcg total) by mouth daily.   dapagliflozin propanediol 10 MG Tabs tablet Commonly known as: Farxiga Take 1 tablet (10 mg total) by mouth daily before breakfast.   Entresto 24-26 MG Generic drug: sacubitril-valsartan Take 1 tablet by mouth 2 (two) times daily.   hydrALAZINE 50 MG tablet Commonly known as: APRESOLINE Take 2 tablets (100 mg total) by mouth 2 (two) times daily.   Klor-Con M20 20 MEQ tablet Generic drug: potassium chloride SA Take 20 mEq by mouth at bedtime.   levothyroxine 200 MCG tablet Commonly known as: SYNTHROID Take 1 tablet (200 mcg total) by mouth daily before breakfast.   metFORMIN 500 MG tablet Commonly known as: GLUCOPHAGE Take 2 tablets (1,000 mg total) by mouth 2 (two) times daily with a meal.  methocarbamol 500 MG tablet Commonly known as: ROBAXIN Take 1 tablet (500 mg total) by mouth every 8 (eight) hours as needed for muscle spasms.   metoprolol succinate 25 MG 24 hr tablet Commonly known as: Toprol XL Take 1 tablet (25 mg total) by mouth daily. What changed: when to take this   ondansetron 4 MG disintegrating tablet Commonly known as: ZOFRAN-ODT Take 1 tablet (4 mg total) by mouth every 8 (eight) hours as needed for nausea or vomiting.   oxyCODONE-acetaminophen 10-325 MG tablet Commonly known as: PERCOCET Take 1 tablet by mouth every 8 (eight) hours as needed.   oxyCODONE-acetaminophen 10-325 MG tablet Commonly known as: PERCOCET Take 1 tablet by mouth every 8 (eight) hours as needed. Start taking on: January 20, 2023   oxyCODONE-acetaminophen 10-325 MG tablet Commonly known as: PERCOCET Take 1 tablet by mouth every 8 (eight) hours as needed. Start taking on: February 19, 2023   pregabalin 50 MG capsule Commonly known as: LYRICA Take 1 capsule (50 mg total) by mouth 3 (three) times daily for 14  days.   rosuvastatin 40 MG tablet Commonly known as: CRESTOR Take 1 tablet (40 mg total) by mouth daily. What changed: when to take this   sildenafil 100 MG tablet Commonly known as: VIAGRA Take 1 tablet (100 mg total) by mouth daily as needed for erectile dysfunction. Take two hours prior to intercourse on an empty stomach   tamsulosin 0.4 MG Caps capsule Commonly known as: FLOMAX Take 0.4 mg by mouth daily.        Allergies:  No Known Allergies  Family History: Family History  Problem Relation Age of Onset   Cancer Maternal Aunt    ADD / ADHD Daughter    Asthma Son    Prostate cancer Neg Hx    Kidney disease Neg Hx    Kidney cancer Neg Hx    Bladder Cancer Neg Hx     Social History:  reports that he has quit smoking. His smoking use included cigarettes. He has been exposed to tobacco smoke. He has never used smokeless tobacco. He reports that he does not drink alcohol and does not use drugs.   Physical Exam: There were no vitals taken for this visit.  Constitutional:  Well nourished. Alert and oriented, No acute distress. HEENT: [place] AT, moist mucus membranes.  Trachea midline, no masses. Cardiovascular: No clubbing, cyanosis, or edema. Respiratory: Normal respiratory effort, no increased work of breathing. GI: Abdomen is soft, non tender, non distended, no abdominal masses. Liver and spleen not palpable.  No hernias appreciated.  Stool sample for occult testing is not indicated.   GU: No CVA tenderness.  No bladder fullness or masses.  Patient with circumcised/uncircumcised phallus. ***Foreskin easily retracted***  Urethral meatus is patent.  No penile discharge. No penile lesions or rashes. Scrotum without lesions, cysts, rashes and/or edema.  Testicles are located scrotally bilaterally. No masses are appreciated in the testicles. Left and right epididymis are normal. Rectal: Patient with  normal sphincter tone. Anus and perineum without scarring or rashes. No rectal  masses are appreciated. Prostate is approximately *** grams, *** nodules are appreciated. Seminal vesicles are normal. Skin: No rashes, bruises or suspicious lesions. Lymph: No cervical or inguinal adenopathy. Neurologic: Grossly intact, no focal deficits, moving all 4 extremities. Psychiatric: Normal mood and affect.   Laboratory Data: CMP     Component Value Date/Time   NA 146 (H) 12/22/2022 1422   NA 139 10/06/2013 1441   K 3.8  12/22/2022 1422   K 4.1 10/06/2013 1441   CL 109 (H) 12/22/2022 1422   CL 102 10/06/2013 1441   CO2 25 12/22/2022 1422   CO2 28 10/06/2013 1441   GLUCOSE 99 12/22/2022 1422   GLUCOSE 115 (H) 12/20/2022 1304   GLUCOSE 206 (H) 10/06/2013 1441   BUN 12 12/22/2022 1422   BUN 27 (H) 10/06/2013 1441   CREATININE 1.09 12/22/2022 1422   CREATININE 1.20 12/20/2022 1304   CREATININE 1.08 07/06/2021 1030   CALCIUM 8.8 12/22/2022 1422   CALCIUM 9.1 10/06/2013 1441   PROT 6.2 12/22/2022 1422   PROT 7.9 10/06/2013 1441   ALBUMIN 4.1 12/22/2022 1422   ALBUMIN 3.3 (L) 10/06/2013 1441   AST 74 (H) 12/22/2022 1422   AST 62 (H) 12/20/2022 1304   ALT 40 12/22/2022 1422   ALT 35 12/20/2022 1304   ALT 22 10/06/2013 1441   ALKPHOS 87 12/22/2022 1422   ALKPHOS 70 10/06/2013 1441   BILITOT 0.4 12/22/2022 1422   BILITOT 0.8 12/20/2022 1304   EGFR 70 12/22/2022 1422   GFRNONAA >60 12/20/2022 1304   GFRNONAA 53 (L) 10/06/2013 1441    CBC    Component Value Date/Time   WBC 9.7 12/22/2022 1422   WBC 10.3 12/20/2022 1303   WBC 11.1 (H) 11/12/2022 0633   RBC 3.25 (L) 12/22/2022 1422   RBC 3.19 (L) 12/20/2022 1303   RBC 3.17 (L) 12/20/2022 1303   HGB 9.1 (L) 12/22/2022 1422   HCT 29.5 (L) 12/22/2022 1422   PLT 150 12/22/2022 1422   MCV 91 12/22/2022 1422   MCV 92 10/06/2013 1441   MCH 28.0 12/22/2022 1422   MCH 29.0 12/20/2022 1303   MCHC 30.8 (L) 12/22/2022 1422   MCHC 31.6 12/20/2022 1303   RDW 16.6 (H) 12/22/2022 1422   RDW 14.2 10/06/2013 1441    LYMPHSABS 5.7 (H) 12/22/2022 1422   LYMPHSABS 2.3 10/06/2013 1441   MONOABS 0.5 12/20/2022 1303   MONOABS 0.4 10/06/2013 1441   EOSABS 0.2 12/22/2022 1422   EOSABS 0.4 10/06/2013 1441   BASOSABS 0.1 12/22/2022 1422   BASOSABS 0.1 10/06/2013 1441  I have reviewed the labs.   Pertinent Imaging: N/A   Assessment & Plan:    1. BPH with LU TS -PSA stable  -continue conservative management, avoiding bladder irritants and timed voiding's -Continue tamsulosin 0.4 mg daily  2. ED -testosterone was found to be within normal limits   No follow-ups on file.  Cloretta Ned   [facility] [address], [address] [place], [place] [address] [phone]

## 2022-12-27 NOTE — Progress Notes (Signed)
Significant low functioning thyroid; TSH is 27.  Other labs show stable diabetes with A1c in pre-diabetic range. Continued low iron levels.Stable cholesterol. Consistent elevation in liver enzyme, AST. Stable iron deficient anemia.

## 2022-12-27 NOTE — Patient Instructions (Signed)
Minimally Invasive Partial Colectomy, Adult, Care After The following information offers guidance on how to care for yourself after your procedure. Your health care provider may also give you more specific instructions. If you have problems or questions, contact your health care provider. What can I expect after the surgery? After the procedure, it is common to have: Pain, bruising, and swelling. Bloating. Weakness and tiredness (fatigue). Changes to your bowel movements, especially having bowel movements more often. Follow these instructions at home: Medicines Take over-the-counter and prescription medicines only as told by your health care provider. If you were prescribed an antibiotic medicine, take it as told by your health care provider. Do not stop using the antibiotic even if you start to feel better. Ask your health care provider if the medicine prescribed to you: Requires you to avoid driving or using machinery. Can cause constipation. You may need to take these actions to prevent or treat constipation: Drink enough fluids to keep your urine pale yellow. Take over-the-counter or prescription medicines. Limit foods that are high in fat and processed sugars, such as fried or sweet foods. Eating and drinking Follow instructions from your health care provider about what you may eat and drink. Do not drink alcohol if your health care provider tells you not to drink. Eat a low-fiber diet for the first 4 weeks after surgery or as told by your health care provider.. Most people on a low-fiber eating plan should eat less than 10 grams (g) of fiber a day. Follow recommendations from your health care provider or dietitian about how much fiber you should have each day. Always check food labels to know the fiber content of packaged foods. In general, a low-fiber food will have fewer than 2 g of fiber per serving. In general, try to avoid whole grains, raw fruits and vegetables, dried fruit, tough  cuts of meat, nuts, and seeds. Incision care  Follow instructions from your health care provider about how to take care of your incisions. Make sure you: Wash your hands with soap and water for at least 20 seconds before and after you change your bandage (dressing). If soap and water are not available, use hand sanitizer. Change your dressing as told by your health care provider. Leave stitches (sutures), skin glue, or adhesive strips in place. These skin closures may need to stay in place for 2 weeks or longer. If adhesive strip edges start to loosen and curl up, you may trim the loose edges. Do not remove adhesive strips completely unless your health care provider tells you to do that. Check your incision area every day for signs of infection. Check for: More redness, swelling, or pain. Fluid or blood. Warmth. Pus or a bad smell. Activity Rest as told by your health care provider. Avoid sitting for a long time without moving. Get up to take short walks every 1-2 hours. This is important to improve blood flow and breathing. Ask for help if you feel weak or unsteady. You may have to avoid lifting. Ask your health care provider how much you can safely lift. Return to your normal activities as told by your health care provider. Ask your health care provider what activities are safe for you. General instructions Do not use any products that contain nicotine or tobacco. These products include cigarettes, chewing tobacco, and vaping devices, such as e-cigarettes. If you need help quitting, ask your health care provider. Do not take baths, swim, or use a hot tub until your health care provider  approves. Ask your health care provider if you may take showers. You may only be allowed to take sponge baths. Wear compression stockings as told by your health care provider. These stockings help to prevent blood clots and reduce swelling in your legs. Keep all follow-up visits. This is important to monitor  healing and check for any complications. Contact a health care provider if: Medicine is not controlling your pain. You have chills or fever. You have any signs of infection in your incision areas. You have a persistent cough. You have nausea or vomiting. You develop a rash. You have not had a bowel movement in 3 days. Get help right away if: You have severe pain. Your incisions break open after sutures or staples have been removed. You are bleeding from your rectum or have blood in your stool. You have a warm, tender swelling in your leg. You have chest pain or trouble breathing. You have increased swelling in the abdomen. You feel light-headed or you faint. These symptoms may be an emergency. Get help right away. Call 911. Do not wait to see if the symptoms will go away. Do not drive yourself to the hospital. Summary After surgery, it is common to have some pain, bruising, swelling, bloating, tiredness, weakness, or changes to your bowel movements. Follow instructions from your health care provider about what to eat and drink. Return to your normal activities as told by your health care provider. Check your incision area every day for signs of infection. Get help right away if you have chest pain or trouble breathing. This information is not intended to replace advice given to you by your health care provider. Make sure you discuss any questions you have with your health care provider. Document Revised: 04/29/2021 Document Reviewed: 04/29/2021 Elsevier Patient Education  2024 [employer].

## 2022-12-27 NOTE — Progress Notes (Signed)
S/p HALS R colectomy 3 months ago Doing well Incisions healed, tolerating Po no fevers  PE NAD Abd: soft, nt, scab over the right side, no active infection Reducible RIH  A/p doing well Inguinal Hernia will need to be fixed at some point in time as it was symptomatic  RTC 2 month for [facility] eval

## 2022-12-29 ENCOUNTER — Ambulatory Visit: Payer: No Typology Code available for payment source | Admitting: Urology

## 2022-12-29 DIAGNOSIS — N529 Male erectile dysfunction, unspecified: Secondary | ICD-10-CM

## 2022-12-29 DIAGNOSIS — N138 Other obstructive and reflux uropathy: Secondary | ICD-10-CM

## 2023-01-06 ENCOUNTER — Inpatient Hospital Stay: Payer: No Typology Code available for payment source | Attending: Oncology

## 2023-01-06 VITALS — BP 143/76 | HR 62 | Temp 96.8°F | Resp 16

## 2023-01-06 DIAGNOSIS — C9110 Chronic lymphocytic leukemia of B-cell type not having achieved remission: Secondary | ICD-10-CM

## 2023-01-06 DIAGNOSIS — D509 Iron deficiency anemia, unspecified: Secondary | ICD-10-CM | POA: Diagnosis not present

## 2023-01-06 MED ORDER — IRON SUCROSE 20 MG/ML IV SOLN
200.0000 mg | Freq: Once | INTRAVENOUS | Status: AC
  Administered 2023-01-06: 200 mg via INTRAVENOUS
  Filled 2023-01-06: qty 10

## 2023-01-06 NOTE — Progress Notes (Signed)
Pt tolerated treatment without concerns.  VSS.  Pt refused 30 minute post observation.  Pt understands risks.   

## 2023-01-06 NOTE — Patient Instructions (Signed)
Iron Sucrose Injection What is this medication? IRON SUCROSE (EYE ern SOO krose) treats low levels of iron (iron deficiency anemia) in people with kidney disease. Iron is a mineral that plays an important role in making red blood cells, which carry oxygen from your lungs to the rest of your body. This medicine may be used for other purposes; ask your health care provider or pharmacist if you have questions. COMMON BRAND NAME(S): Venofer What should I tell my care team before I take this medication? They need to know if you have any of these conditions: Anemia not caused by low iron levels Heart disease High levels of iron in the blood Kidney disease Liver disease An unusual or allergic reaction to iron, other medications, foods, dyes, or preservatives Pregnant or trying to get pregnant Breastfeeding How should I use this medication? This medication is for infusion into a vein. It is given in a hospital or clinic setting. Talk to your care team about the use of this medication in children. While this medication may be prescribed for children as young as 2 years for selected conditions, precautions do apply. Overdosage: If you think you have taken too much of this medicine contact a poison control center or emergency room at once. NOTE: This medicine is only for you. Do not share this medicine with others. What if I miss a dose? Keep appointments for follow-up doses. It is important not to miss your dose. Call your care team if you are unable to keep an appointment. What may interact with this medication? Do not take this medication with any of the following: Deferoxamine Dimercaprol Other iron products This medication may also interact with the following: Chloramphenicol Deferasirox This list may not describe all possible interactions. Give your health care provider a list of all the medicines, herbs, non-prescription drugs, or dietary supplements you use. Also tell them if you smoke,  drink alcohol, or use illegal drugs. Some items may interact with your medicine. What should I watch for while using this medication? Visit your care team regularly. Tell your care team if your symptoms do not start to get better or if they get worse. You may need blood work done while you are taking this medication. You may need to follow a special diet. Talk to your care team. Foods that contain iron include: whole grains/cereals, dried fruits, beans, or peas, leafy green vegetables, and organ meats (liver, kidney). What side effects may I notice from receiving this medication? Side effects that you should report to your care team as soon as possible: Allergic reactions--skin rash, itching, hives, swelling of the face, lips, tongue, or throat Low blood pressure--dizziness, feeling faint or lightheaded, blurry vision Shortness of breath Side effects that usually do not require medical attention (report to your care team if they continue or are bothersome): Flushing Headache Joint pain Muscle pain Nausea Pain, redness, or irritation at injection site This list may not describe all possible side effects. Call your doctor for medical advice about side effects. You may report side effects to FDA at 1-800-FDA-1088. Where should I keep my medication? This medication is given in a hospital or clinic. It will not be stored at home. NOTE: This sheet is a summary. It may not cover all possible information. If you have questions about this medicine, talk to your doctor, pharmacist, or health care provider.  2024 Elsevier/Gold Standard (2022-06-18 00:00:00)

## 2023-01-13 ENCOUNTER — Inpatient Hospital Stay: Payer: No Typology Code available for payment source

## 2023-01-13 VITALS — BP 145/71 | HR 61 | Temp 96.3°F | Resp 18

## 2023-01-13 DIAGNOSIS — C9110 Chronic lymphocytic leukemia of B-cell type not having achieved remission: Secondary | ICD-10-CM

## 2023-01-13 DIAGNOSIS — D509 Iron deficiency anemia, unspecified: Secondary | ICD-10-CM | POA: Diagnosis not present

## 2023-01-13 MED ORDER — IRON SUCROSE 20 MG/ML IV SOLN
200.0000 mg | Freq: Once | INTRAVENOUS | Status: AC
  Administered 2023-01-13: 200 mg via INTRAVENOUS

## 2023-01-13 NOTE — Patient Instructions (Signed)
Iron Sucrose Injection What is this medication? IRON SUCROSE (EYE ern SOO krose) treats low levels of iron (iron deficiency anemia) in people with kidney disease. Iron is a mineral that plays an important role in making red blood cells, which carry oxygen from your lungs to the rest of your body. This medicine may be used for other purposes; ask your health care provider or pharmacist if you have questions. COMMON BRAND NAME(S): Venofer What should I tell my care team before I take this medication? They need to know if you have any of these conditions: Anemia not caused by low iron levels Heart disease High levels of iron in the blood Kidney disease Liver disease An unusual or allergic reaction to iron, other medications, foods, dyes, or preservatives Pregnant or trying to get pregnant Breastfeeding How should I use this medication? This medication is for infusion into a vein. It is given in a hospital or clinic setting. Talk to your care team about the use of this medication in children. While this medication may be prescribed for children as young as 2 years for selected conditions, precautions do apply. Overdosage: If you think you have taken too much of this medicine contact a poison control center or emergency room at once. NOTE: This medicine is only for you. Do not share this medicine with others. What if I miss a dose? Keep appointments for follow-up doses. It is important not to miss your dose. Call your care team if you are unable to keep an appointment. What may interact with this medication? Do not take this medication with any of the following: Deferoxamine Dimercaprol Other iron products This medication may also interact with the following: Chloramphenicol Deferasirox This list may not describe all possible interactions. Give your health care provider a list of all the medicines, herbs, non-prescription drugs, or dietary supplements you use. Also tell them if you smoke,  drink alcohol, or use illegal drugs. Some items may interact with your medicine. What should I watch for while using this medication? Visit your care team regularly. Tell your care team if your symptoms do not start to get better or if they get worse. You may need blood work done while you are taking this medication. You may need to follow a special diet. Talk to your care team. Foods that contain iron include: whole grains/cereals, dried fruits, beans, or peas, leafy green vegetables, and organ meats (liver, kidney). What side effects may I notice from receiving this medication? Side effects that you should report to your care team as soon as possible: Allergic reactions--skin rash, itching, hives, swelling of the face, lips, tongue, or throat Low blood pressure--dizziness, feeling faint or lightheaded, blurry vision Shortness of breath Side effects that usually do not require medical attention (report to your care team if they continue or are bothersome): Flushing Headache Joint pain Muscle pain Nausea Pain, redness, or irritation at injection site This list may not describe all possible side effects. Call your doctor for medical advice about side effects. You may report side effects to FDA at 1-800-FDA-1088. Where should I keep my medication? This medication is given in a hospital or clinic. It will not be stored at home. NOTE: This sheet is a summary. It may not cover all possible information. If you have questions about this medicine, talk to your doctor, pharmacist, or health care provider.  2024 Elsevier/Gold Standard (2022-06-18 00:00:00)

## 2023-01-14 ENCOUNTER — Other Ambulatory Visit: Payer: Self-pay

## 2023-01-14 NOTE — Progress Notes (Signed)
Specialty Pharmacy Refill Coordination Note  Paul Nesci. is a 77 y.o. male contacted today regarding refills of specialty medication(s) Zanubrutinib (Brukinsa)   Patient requested Delivery   Delivery date: 01/21/23   Verified address: [address]   [place] [id number]   Medication will be filled on 01/20/23.

## 2023-01-21 ENCOUNTER — Other Ambulatory Visit: Payer: Self-pay

## 2023-01-24 ENCOUNTER — Encounter: Payer: No Typology Code available for payment source | Admitting: Surgery

## 2023-01-30 ENCOUNTER — Other Ambulatory Visit (HOSPITAL_COMMUNITY): Payer: Self-pay

## 2023-01-30 ENCOUNTER — Encounter: Payer: Self-pay | Admitting: Oncology

## 2023-01-30 ENCOUNTER — Telehealth: Payer: Self-pay

## 2023-01-30 NOTE — Telephone Encounter (Signed)
 Oral Oncology Patient Advocate Encounter  Was successful in securing patient a $8,000.00 grant from [facility] to provide copayment coverage for Brukinsa .  This will keep the out of pocket expense at $0.     [employer] ID: [id number]   The billing information is as follows and has been shared with [name].    RxBin: [id number] PCN: [id number] Member ID: [id number] Group ID: [id number] Dates of Eligibility: 01/07/23 through 01/06/24  Fund:  Chronic Lymphocytic Leukemia   Morene Potters, CPhT Oncology Pharmacy Patient Advocate  [facility]  [phone] (phone) [phone] (fax)

## 2023-02-01 DIAGNOSIS — H40003 Preglaucoma, unspecified, bilateral: Secondary | ICD-10-CM | POA: Diagnosis not present

## 2023-02-01 DIAGNOSIS — H2513 Age-related nuclear cataract, bilateral: Secondary | ICD-10-CM | POA: Diagnosis not present

## 2023-02-01 DIAGNOSIS — H43813 Vitreous degeneration, bilateral: Secondary | ICD-10-CM | POA: Diagnosis not present

## 2023-02-01 DIAGNOSIS — E119 Type 2 diabetes mellitus without complications: Secondary | ICD-10-CM | POA: Diagnosis not present

## 2023-02-08 ENCOUNTER — Other Ambulatory Visit: Payer: Self-pay

## 2023-02-10 ENCOUNTER — Other Ambulatory Visit: Payer: Self-pay

## 2023-02-10 NOTE — Progress Notes (Signed)
Specialty Pharmacy Ongoing Clinical Assessment Note  Paul Briggs [name] is a 78 y.o. male who is being followed by the specialty pharmacy service for [facility] Oncology   Patient's specialty medication(s) reviewed today: Zanubrutinib (Brukinsa)   Missed doses in the last 4 weeks: 0   Patient/Caregiver did not have any additional questions or concerns.   Therapeutic benefit summary: Unable to assess  Patient has follow-up appt in February. Patient expressed feeling overly fatigued on medication. Provided nurse triage number for patient to call before appointment as well.   Adverse events/side effects summary: Experienced adverse events/side effects   Patient's therapy is appropriate to: Continue    Goals Addressed             This Visit's Progress    Stabilization of disease       Patient is on track. Patient will be evaluated at upcoming provider appointment to assess progress         Follow up:  3 months  Bobette Mo Specialty Pharmacist

## 2023-02-10 NOTE — Progress Notes (Signed)
Specialty Pharmacy Refill Coordination Note  Markley Mujica. is a 78 y.o. male contacted today regarding refills of specialty medication(s) Zanubrutinib (Brukinsa)   Patient requested Delivery   Delivery date: 02/18/23   Verified address: [address]   Medication will be filled on 02/17/23.

## 2023-02-11 ENCOUNTER — Telehealth: Payer: Self-pay

## 2023-02-11 NOTE — Telephone Encounter (Signed)
Received message from Vanessa Kick, RN @ [facility] stating that pt left VM at their office stating that "his pills make him feel bad." Call back made to patient but no answer. Left message for him to call [facility] and let us know what is going on with medication. [facility] phone number provided.

## 2023-02-13 ENCOUNTER — Emergency Department
Admission: EM | Admit: 2023-02-13 | Discharge: 2023-02-13 | Disposition: A | Discharge status: Home / Self Care | Payer: No Typology Code available for payment source | Attending: Emergency Medicine | Admitting: Emergency Medicine

## 2023-02-13 ENCOUNTER — Emergency Department: Payer: No Typology Code available for payment source

## 2023-02-13 ENCOUNTER — Other Ambulatory Visit: Payer: Self-pay

## 2023-02-13 DIAGNOSIS — M47812 Spondylosis without myelopathy or radiculopathy, cervical region: Secondary | ICD-10-CM | POA: Diagnosis not present

## 2023-02-13 DIAGNOSIS — E1122 Type 2 diabetes mellitus with diabetic chronic kidney disease: Secondary | ICD-10-CM | POA: Diagnosis not present

## 2023-02-13 DIAGNOSIS — E039 Hypothyroidism, unspecified: Secondary | ICD-10-CM | POA: Diagnosis not present

## 2023-02-13 DIAGNOSIS — M19011 Primary osteoarthritis, right shoulder: Secondary | ICD-10-CM | POA: Diagnosis not present

## 2023-02-13 DIAGNOSIS — G8911 Acute pain due to trauma: Secondary | ICD-10-CM | POA: Diagnosis not present

## 2023-02-13 DIAGNOSIS — N189 Chronic kidney disease, unspecified: Secondary | ICD-10-CM | POA: Diagnosis not present

## 2023-02-13 DIAGNOSIS — G8929 Other chronic pain: Secondary | ICD-10-CM

## 2023-02-13 DIAGNOSIS — Z856 Personal history of leukemia: Secondary | ICD-10-CM | POA: Insufficient documentation

## 2023-02-13 DIAGNOSIS — M25511 Pain in right shoulder: Secondary | ICD-10-CM | POA: Insufficient documentation

## 2023-02-13 DIAGNOSIS — S0003XA Contusion of scalp, initial encounter: Secondary | ICD-10-CM | POA: Insufficient documentation

## 2023-02-13 DIAGNOSIS — W19XXXA Unspecified fall, initial encounter: Secondary | ICD-10-CM

## 2023-02-13 DIAGNOSIS — W01198A Fall on same level from slipping, tripping and stumbling with subsequent striking against other object, initial encounter: Secondary | ICD-10-CM | POA: Insufficient documentation

## 2023-02-13 DIAGNOSIS — I129 Hypertensive chronic kidney disease with stage 1 through stage 4 chronic kidney disease, or unspecified chronic kidney disease: Secondary | ICD-10-CM | POA: Diagnosis not present

## 2023-02-13 DIAGNOSIS — S0990XA Unspecified injury of head, initial encounter: Secondary | ICD-10-CM | POA: Diagnosis not present

## 2023-02-13 DIAGNOSIS — Z043 Encounter for examination and observation following other accident: Secondary | ICD-10-CM | POA: Diagnosis not present

## 2023-02-13 DIAGNOSIS — M47816 Spondylosis without myelopathy or radiculopathy, lumbar region: Secondary | ICD-10-CM | POA: Diagnosis not present

## 2023-02-13 DIAGNOSIS — M4316 Spondylolisthesis, lumbar region: Secondary | ICD-10-CM | POA: Diagnosis not present

## 2023-02-13 DIAGNOSIS — M545 Low back pain, unspecified: Secondary | ICD-10-CM | POA: Insufficient documentation

## 2023-02-13 DIAGNOSIS — S199XXA Unspecified injury of neck, initial encounter: Secondary | ICD-10-CM | POA: Diagnosis not present

## 2023-02-13 DIAGNOSIS — M5127 Other intervertebral disc displacement, lumbosacral region: Secondary | ICD-10-CM | POA: Diagnosis not present

## 2023-02-13 DIAGNOSIS — I6523 Occlusion and stenosis of bilateral carotid arteries: Secondary | ICD-10-CM | POA: Diagnosis not present

## 2023-02-13 NOTE — ED Provider Notes (Signed)
[facility] Provider Note    Event Date/Time   First MD Initiated Contact with Patient 02/13/23 1421     (approximate)   History   Fall   HPI  Paul Weigandt [name] is a 78 y.o. male   presents to the ED with complaint of headache, right shoulder pain and low back pain after falling on ice Friday.  Patient denies any loss of consciousness and also denies any nausea, vomiting, visual changes or dizziness.  He reports he has been taking oxycodone at home as he is on chronic pain management for his back.  Patient has history of hypertension, hypothyroidism, CKD, chronic pain syndrome, CLL, and diabetes type 2.      Physical Exam   Triage Vital Signs: ED Triage Vitals  Encounter Vitals Group     BP 02/13/23 1332 (!) 170/81     Systolic BP Percentile --      Diastolic BP Percentile --      Pulse Rate 02/13/23 1332 64     Resp 02/13/23 1332 18     Temp 02/13/23 1332 98 F (36.7 C)     Temp src --      SpO2 02/13/23 1332 100 %     Weight 02/13/23 1331 222 lb (100.7 kg)     Height 02/13/23 1331 6\' 2"  (1.88 m)     Head Circumference --      Peak Flow --      Pain Score 02/13/23 1331 5     Pain Loc --      Pain Education --      Exclude from Growth Chart --     Most recent vital signs: Vitals:   02/13/23 1332  BP: (!) 170/81  Pulse: 64  Resp: 18  Temp: 98 F (36.7 C)  SpO2: 100%     General: Awake, no distress.  Alert, talkative, answers questions appropriately. CV:  Good peripheral perfusion.  Heart regular rate rhythm. Resp:  Normal effort.  Lungs are clear bilaterally. Abd:  No distention.  Soft, nontender. Other:  On examination of the scalp posteriorly there is a tender soft tissue swelling approximately 2 cm with skin intact.  Cervical spine without any point tenderness.  On examination of the right shoulder there is generalized tenderness on palpation but no evidence of deformity.  Patient is able to move digits distally without  any difficulty.  Pulses are present.  No tenderness is noted on palpation of the thoracic spine however moderate tenderness on palpation of the lumbar spine especially approximately L4-L5 and L5-S1 area.  Right paravertebral muscles are also tender to palpation and range of motion is slow and restricted secondary to increased pain.  Nontender extremities to palpation.   ED Results / Procedures / Treatments   Labs (all labs ordered are listed, but only abnormal results are displayed) Labs Reviewed - No data to display   RADIOLOGY  Right shoulder x-ray images reviewed by myself needed for fracture or dislocation.  There is moderate degenerative changes noted.  X-ray report per radiologist agrees. Lumbar spine x-ray images were reviewed by myself independent of the radiologist and was negative for acute fracture but degenerative changes are noted.  CT head without contrast per radiologist is negative for acute intracranial abnormalities. CT cervical spine per radiologist is negative for acute changes but does show chronic multilevel degenerative changes  PROCEDURES:  Critical Care performed:   Procedures   MEDICATIONS ORDERED IN ED: Medications - No data  to display   IMPRESSION / MDM / ASSESSMENT AND PLAN / ED COURSE  I reviewed the triage vital signs and the nursing notes.   Differential diagnosis includes, but is not limited to, minor head injury without loss of consciousness, contusion scalp, cervical fracture, subluxation, cervical strain, right shoulder contusion, fracture, dislocation, lumbar pain exacerbated by fall, fracture, subluxation.  78 year old male presents to the ED with concerns after a fall that occurred 3 days ago.  Patient slipped on ice hitting his head without loss of consciousness.  CT head and cervical spine are reassuring and patient was made aware that x-rays of his right shoulder and lower back show degenerative changes that he is aware of but no new  findings.  He already has oxycodone at home that he routinely takes for chronic pain.  He is encouraged to continue taking that only as directed by his doctor.      Patient's presentation is most consistent with acute illness / injury with system symptoms.  FINAL CLINICAL IMPRESSION(S) / ED DIAGNOSES   Final diagnoses:  Contusion of scalp, initial encounter  Acute pain of right shoulder due to trauma  Acute exacerbation of chronic low back pain  Fall, initial encounter     Rx / DC Orders   ED Discharge Orders     None        Note:  This document was prepared using Dragon voice recognition software and may include unintentional dictation errors.   Tommi Rumps, PA-C 02/13/23 1519    Minna Antis, MD 02/14/23 1419

## 2023-02-13 NOTE — Discharge Instructions (Signed)
Follow-up with your primary care provider if any continued problems.  Continue taking your pain medication as prescribed by your doctor.  You may apply ice or heat to your muscles as needed for discomfort.

## 2023-02-13 NOTE — ED Triage Notes (Signed)
Pt comes with c/o fall on Friday on the ice. Pt states he slipped and fell. Pt states he hit his head and did not pass out.   Pt states pain to head, right shoulder and lower back.

## 2023-02-15 DIAGNOSIS — S4991XA Unspecified injury of right shoulder and upper arm, initial encounter: Secondary | ICD-10-CM | POA: Diagnosis not present

## 2023-02-15 DIAGNOSIS — W009XXA Unspecified fall due to ice and snow, initial encounter: Secondary | ICD-10-CM | POA: Diagnosis not present

## 2023-02-16 ENCOUNTER — Telehealth: Payer: Self-pay | Admitting: *Deleted

## 2023-02-16 NOTE — Telephone Encounter (Signed)
Pt scheduled for Friday

## 2023-02-16 NOTE — Telephone Encounter (Signed)
This patient called to say that he is losing weight he eats really good especially more than he did and he wants to know if what is the reason why the losing of the weight and is there anything else he can do or anything that the doctor can do for him to get more weight on him.  He said that he had talked to someone but they were going to call back later.

## 2023-02-17 ENCOUNTER — Other Ambulatory Visit: Payer: Self-pay

## 2023-02-17 ENCOUNTER — Ambulatory Visit: Payer: PRIVATE HEALTH INSURANCE | Admitting: Oncology

## 2023-02-17 ENCOUNTER — Other Ambulatory Visit: Payer: PRIVATE HEALTH INSURANCE

## 2023-02-17 NOTE — Assessment & Plan Note (Addendum)
Labs are reviewed and discussed with patient. CT chest abdomen pelvis results reviewed and discussed with patient.   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 continue Zanubrutinib 160 mg twice daily

## 2023-02-18 ENCOUNTER — Other Ambulatory Visit: Payer: Self-pay

## 2023-02-18 ENCOUNTER — Encounter: Payer: Self-pay | Admitting: Oncology

## 2023-02-18 ENCOUNTER — Inpatient Hospital Stay: Payer: No Typology Code available for payment source | Attending: Oncology

## 2023-02-18 ENCOUNTER — Inpatient Hospital Stay (HOSPITAL_BASED_OUTPATIENT_CLINIC_OR_DEPARTMENT_OTHER): Payer: No Typology Code available for payment source | Admitting: Oncology

## 2023-02-18 ENCOUNTER — Telehealth: Payer: Self-pay

## 2023-02-18 VITALS — BP 159/73 | HR 62 | Temp 96.3°F | Resp 18 | Wt 223.3 lb

## 2023-02-18 DIAGNOSIS — N183 Chronic kidney disease, stage 3 unspecified: Secondary | ICD-10-CM | POA: Diagnosis not present

## 2023-02-18 DIAGNOSIS — D509 Iron deficiency anemia, unspecified: Secondary | ICD-10-CM

## 2023-02-18 DIAGNOSIS — Z5111 Encounter for antineoplastic chemotherapy: Secondary | ICD-10-CM | POA: Diagnosis not present

## 2023-02-18 DIAGNOSIS — C9110 Chronic lymphocytic leukemia of B-cell type not having achieved remission: Secondary | ICD-10-CM

## 2023-02-18 DIAGNOSIS — N1831 Chronic kidney disease, stage 3a: Secondary | ICD-10-CM | POA: Diagnosis not present

## 2023-02-18 DIAGNOSIS — D126 Benign neoplasm of colon, unspecified: Secondary | ICD-10-CM | POA: Diagnosis not present

## 2023-02-18 DIAGNOSIS — D631 Anemia in chronic kidney disease: Secondary | ICD-10-CM | POA: Diagnosis not present

## 2023-02-18 LAB — CBC WITH DIFFERENTIAL (CANCER CENTER ONLY)
Abs Immature Granulocytes: 0.18 10*3/uL — ABNORMAL HIGH (ref 0.00–0.07)
Basophils Absolute: 0.1 10*3/uL (ref 0.0–0.1)
Basophils Relative: 0 %
Eosinophils Absolute: 0 10*3/uL (ref 0.0–0.5)
Eosinophils Relative: 0 %
HCT: 27.8 % — ABNORMAL LOW (ref 39.0–52.0)
Hemoglobin: 8.6 g/dL — ABNORMAL LOW (ref 13.0–17.0)
Immature Granulocytes: 2 %
Lymphocytes Relative: 54 %
Lymphs Abs: 6.4 10*3/uL — ABNORMAL HIGH (ref 0.7–4.0)
MCH: 27.1 pg (ref 26.0–34.0)
MCHC: 30.9 g/dL (ref 30.0–36.0)
MCV: 87.7 fL (ref 80.0–100.0)
Monocytes Absolute: 0.4 10*3/uL (ref 0.1–1.0)
Monocytes Relative: 3 %
Neutro Abs: 4.9 10*3/uL (ref 1.7–7.7)
Neutrophils Relative %: 41 %
Platelet Count: 182 10*3/uL (ref 150–400)
RBC: 3.17 MIL/uL — ABNORMAL LOW (ref 4.22–5.81)
RDW: 18.2 % — ABNORMAL HIGH (ref 11.5–15.5)
Smear Review: NORMAL
WBC Count: 11.9 10*3/uL — ABNORMAL HIGH (ref 4.0–10.5)
nRBC: 0 % (ref 0.0–0.2)

## 2023-02-18 LAB — CMP (CANCER CENTER ONLY)
ALT: 31 U/L (ref 0–44)
AST: 38 U/L (ref 15–41)
Albumin: 3.8 g/dL (ref 3.5–5.0)
Alkaline Phosphatase: 84 U/L (ref 38–126)
Anion gap: 8 (ref 5–15)
BUN: 18 mg/dL (ref 8–23)
CO2: 26 mmol/L (ref 22–32)
Calcium: 8.5 mg/dL — ABNORMAL LOW (ref 8.9–10.3)
Chloride: 103 mmol/L (ref 98–111)
Creatinine: 0.89 mg/dL (ref 0.61–1.24)
GFR, Estimated: >60 mL/min (ref >60)
Glucose, Bld: 213 mg/dL — ABNORMAL HIGH (ref 70–99)
Potassium: 4.2 mmol/L (ref 3.5–5.1)
Sodium: 137 mmol/L (ref 135–145)
Total Bilirubin: 0.6 mg/dL (ref 0.0–1.2)
Total Protein: 6.2 g/dL — ABNORMAL LOW (ref 6.5–8.1)

## 2023-02-18 LAB — IRON AND TIBC
Iron: 21 ug/dL — ABNORMAL LOW (ref 45–182)
Saturation Ratios: 6 % — ABNORMAL LOW (ref 17.9–39.5)
TIBC: 342 ug/dL (ref 250–450)
UIBC: 321 ug/dL

## 2023-02-18 LAB — RETIC PANEL
Immature Retic Fract: 17.6 % — ABNORMAL HIGH (ref 2.3–15.9)
RBC.: 3.12 MIL/uL — ABNORMAL LOW (ref 4.22–5.81)
Retic Count, Absolute: 52.4 10*3/uL (ref 19.0–186.0)
Retic Ct Pct: 1.7 % (ref 0.4–3.1)
Reticulocyte Hemoglobin: 22.9 pg — ABNORMAL LOW (ref >27.9)

## 2023-02-18 LAB — TECHNOLOGIST SMEAR REVIEW
Plt Morphology: NORMAL
RBC MORPHOLOGY: NORMAL
WBC MORPHOLOGY: NORMAL

## 2023-02-18 LAB — FERRITIN: Ferritin: 12 ng/mL — ABNORMAL LOW (ref 24–336)

## 2023-02-18 MED ORDER — OYSTER SHELL CALCIUM/D3 500-5 MG-MCG PO TABS: 1.0000 | ORAL_TABLET | Freq: Every day | ORAL | Status: DC

## 2023-02-18 NOTE — Progress Notes (Signed)
Pt here for follow up. Pt reports that he was concerned because he had lost some weight loss. Today scale shows increased weight from last visit.

## 2023-02-18 NOTE — Telephone Encounter (Signed)
-----   Message from [name] sent at 02/18/2023  1:36 PM EST ----- Please let patient know that his iron level is low.I recommend him to get IV venofer weekly x 4. First dose asap thanks.  Follow up in 8 weeks lab prior to MD +/- Venofer. [I will order labs] Thanks.

## 2023-02-18 NOTE — Assessment & Plan Note (Signed)
Encourage oral hydration and avoid nephrotoxins.

## 2023-02-18 NOTE — Progress Notes (Signed)
Hematology/Oncology Progress note [phone] [phone]        ASSESSMENT & PLAN:   Cancer Staging  CLL (chronic lymphocytic leukemia) (HCC) Staging form: Chronic Lymphocytic Leukemia / Small Lymphocytic Lymphoma, AJCC 8th Edition - Clinical stage from 12/28/2021: Modified Rai Stage III (Modified Rai risk: High, Lymphocytosis: Present, Adenopathy: Present, Organomegaly: Absent, Anemia: Present, Thrombocytopenia: Absent) - Signed by [name], MD on 01/20/2022   CLL (chronic lymphocytic leukemia) [facility]) Labs are reviewed and discussed with patient. CT chest abdomen pelvis results reviewed and discussed with patient.   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 continue Zanubrutinib 160 mg twice daily    CKD (chronic kidney disease) stage 3, GFR 30-59 ml/min (HCC) Encourage oral hydration and avoid nephrotoxins.     IDA (iron deficiency anemia) Anemia is likely multifactorial, could be due to CLL, anemia due to chronic kidney disease and IDA. Lab Results  Component Value Date   HGB 8.6 (L) 02/18/2023   TIBC 342 02/18/2023   IRONPCTSAT 6 (L) 02/18/2023   FERRITIN 12 (L) 02/18/2023    Hemoglobin has dropped and he developed recurrent iron deficiency anemia.  Recommend IV Venofer weekly x 4.      Encounter for antineoplastic chemotherapy Continue treatment as mentioned above.   Tubular adenoma of colon He developed recurrent iron deficiency anemia.  Recommend follow up with GI for repeat colonoscopy.  I will send message to Dr. Timothy Lasso.   Orders Placed This Encounter  Procedures   Technologist smear review    Standing Status:   Future    Number of Occurrences:   1    Expected Date:   02/18/2023    Expiration Date:   02/18/2024    Clinical information::   CLL   Follow up 3 months All questions were answered. The patient knows to call the clinic with any problems,  questions or concerns.  [name], MD, PhD [facility] Hematology Oncology 02/18/2023    CHIEF COMPLAINTS/PURPOSE OF CONSULTATION:  CLL  HISTORY OF PRESENTING ILLNESS:  Paul Ana [name] 78 y.o. male presents for follow up of CLL I have reviewed his chart and materials related to his cancer extensively and collaborated history with the patient. Summary of oncologic history is as follows: Oncology History  CLL (chronic lymphocytic leukemia) (HCC)  12/25/2021 - 12/26/2021 Hospital Admission   Patient was admitted due to pain and swelling of right ankle, right knee and left wrist.  Patient was found to have WBC 30.4, with neutrophil 29% and lymphocyte 2%.  Peripheral smear showed leukocytosis with slight left shift in myeloid series and lymphocytosis with abnormal morphology.  Uric acid 5.5, worsening renal function with creatinine 1.35, BUN 18, GFR 55 (baseline creatinine 1.08 on 07/06/2021), lactic acid 1.1.  ESR normal, uric acid 5.5 normal, CRP 22.2, LDH 182  Orthopedic surgery was consulted s/p arthrocentesis, cell count 296 with 72% neutrophil, no crystal. Fluid negative for growth.  Patient received cefepime and vancomycin while in the hospital, Patient was discharged on Keflex for 5 days.   12/28/2021 Cancer Staging   Staging form: Chronic Lymphocytic Leukemia / Small Lymphocytic Lymphoma, AJCC 8th Edition - Clinical stage from 12/28/2021: Modified Rai Stage III (Modified Rai risk: High, Lymphocytosis: Present, Adenopathy: Present, Organomegaly: Absent, Anemia: Present, Thrombocytopenia: Absent) - Signed by [name], MD on 01/20/2022 Stage prefix: Initial diagnosis Hemoglobin (Hgb) (g/dL): 02.7   25/03/6642 Initial Diagnosis   CLL (chronic lymphocytic leukemia)  12/27/21 peripheral blood flowcytometry showed Involvement by CD5+, CD23+, CD20+, CD22+ clonal B cell population, phenotype typical for chronic lymphocytic leukemia/small lymphocytic lymphoma (CLL/SLL), 2 clones present   Two monoclonal B cell populations were detected which have an identical  phenotype except for light chain expression.    01/05/2022 Imaging   CT chest abdomen pelvis wo contrast 1. Multiple prominent borderline enlarged and mildly enlarged lymph nodes, most evident in the low anatomic pelvis, as above, compatible with reported clinical history of CLL. 2. There also several small pulmonary nodules in the lungs measuring 5 mm or less in size. This is nonspecific, but statistically likely benign. No follow-up needed if patient is low-risk (and has no known or suspected primary neoplasm). Non-contrast chest CT can be considered in 12 months if patient is high-risk. This recommendation 3. Aortic atherosclerosis, in addition to left main and 2 vessel coronary artery disease. Please note that although the presence of coronary artery calcium documents the presence of coronary artery disease, the severity of this disease and any potential stenosis cannot be assessed on this non-gated CT examination. Assessment for  potential risk factor modification, dietary therapy or pharmacologic therapy may be warranted, if clinically indicated. 4. There are calcifications of the aortic valve. Echocardiographic correlation for evaluation of potential valvular dysfunction may be warranted if clinically indicated. 5. 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 6. Diverticulosis without evidence of acute diverticulitis at this time. 7. Mild cardiomegaly.   08/26/2022 Imaging   CT abdomen pelvis w contrast  1. Subcutaneous fat stranding within the left lower quadrant anterior abdominal wall extending into the anterior proximal left thigh, likely related to contusion given history of recent trauma. No fluid collection or hematoma. 2. No acute displaced fracture. 3. Retroperitoneal and pelvic lymphadenopathy as above, with waxing and waning appearance. 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 4. 1.9 cm  hypodensity within the medial aspect of the spleen,nonspecific. Leukemic involvement of the spleen cannot be excluded. 5. Distal colonic diverticulosis without diverticulitis. 6.  Aortic Atherosclerosis      INTERVAL HISTORY Beckam Abdulaziz [name] is a 78 y.o. male who has above history reviewed by me today presents for follow up visit for CLL.   08/23/2022 Colonoscopy showed - Likely malignant partially obstructing tumor in the distal ascending colon. Biopsied. Tattooed. - Three 4 to 6 mm polyps in the transverse colon and in the ascending colon, removed with a cold snare. Resected and retrieved. - Five 3 to 7 mm polyps in the rectum, in the descending colon, in the transverse colon and in the ascending colon, removed with a hot snare. Resected and retrieved. 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 - Non- bleeding internal hemorrhoids.  Pathology showed 1. Ascending Colon Polyp, x2 cold snare - TUBULAR ADENOMA(S) (MULTIPLE FRAGMENTS) - NEGATIVE FOR HIGH-GRADE DYSPLASIA OR MALIGNANCY 2. Ascending Colon Polyp, hot snare - TUBULAR ADENOMA (MULTIPLE FRAGMENTS) - NEGATIVE FOR HIGH-GRADE DYSPLASIA OR MALIGNANCY 3. Ascending Colon Biopsy, mass cbx - TUBULAR ADENOMA (MULTIPLE FRAGMENTS) - SEE NOTE 4. Transverse Colon Polyp, hot snare; cold snare - TUBULAR ADENOMA (MULTIPLE FRAGMENTS) - NEGATIVE FOR HIGH-GRADE DYSPLASIA OR MALIGNANCY 5. Descending Colon Polyp, x2 hot snare - TUBULAR ADENOMA(S) (MULTIPLE FRAGMENTS) - NEGATIVE FOR HIGH-GRADE DYSPLASIA OR MALIGNANCY 6. Rectum, polyp(s), hot snare - TUBULOVILLOUS ADENOMA (MULTIPLE FRAGMENTS) - NEGATIVE FOR HIGH-GRADE DYSPLASIA OR MALIGNANCY I&D Recent admission due to sepsis due to cellulitis, left groin abscess, s/p I&D    INTERVAL HISTORY Paul Ana [name] is a  78 y.o. male who has above history reviewed by me today presents for follow up visit for CLL, iron deficiency anemia, colon tubular adenoma with high-grade dysplastic status  post resection. Patient was feeling well today.  He has resumed working.   Denies any fatigue, abdominal pain, blood in his stool.  Denies night sweats or fever. Pt reports that he was concerned because he had lost some weight loss. Today scale shows increased weight from last visit.   MEDICAL HISTORY:  Past Medical History:  Diagnosis Date   Adenoma of left adrenal gland    Anemia    Aortic atherosclerosis (HCC)    Blood transfusion without reported diagnosis    BPH (benign prostatic hyperplasia)    Bradycardia    CAD (coronary artery disease) 08/26/2022   a.) cCTA 08/26/2022: Ca2+ 14.3 (12th %'ile for age/sex.race match control); (<25%) pLAD   Cellulitis of left thigh 08/2022   CHF (congestive heart failure) (HCC)    a.) TTE 10/23/2015: EF 60-65%, mod-sev LVH, mild biatrial dil, degen MV disease, AoV sclerosis, asc Ao 38 mm, G1DD; b.) TTE 08/05/2022: EF 45-50%, mod-sev LVH (speckled pattern), sev biatrial dil, mild MR, AoV sclerosis, G1DD   Chronic pain syndrome    a.) on COT managed by pain management   Chronic, continuous use of opioids    a.) chronic pain syndrome/chronic back pain; managed by pain management   CKD (chronic kidney disease) stage 3, GFR 30-59 ml/min (HCC)    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   a.) Rai stage III   DDD (degenerative disc disease), cervical    Difficult airway 10/22/2015   a.) 1st attempt with glidescope  --> macroglossic (unsuccessful); b.) 2nd attempt with Mac 4 and direct laryngoscopy with a 8.0 ETT --> tube too large to pass through the cords; b.) 3rd attempt (successful) with a Mac 4 and a 7.5 tube   Diverticulosis    DM (diabetes mellitus), type 2 (HCC)    Dysplasia of prostate    Erectile dysfunction    Frequent falls    Hepatic flexure mass 08/23/2022   a.) colonoscopy 08/23/2022: 14 x 15 mm partially obstructing ulcerated distal ascending colon mass; tissue friable --> pathology resulted as tubular adenoma, however felt  to be false (-) --> referral to sugery and colectomy recommended.   History of MRSA infection 08/28/2022   a.) MRSA PCR (+) 08/28/2022; culture from LEFT thigh abscess   HTN (hypertension)    Hyperlipidemia    Hypogonadism in male    Hypothyroidism    IDA (iron deficiency anemia)    LBBB (left bundle branch block)    Long-term use of aspirin therapy    Lumbar spinal stenosis    Mild cardiomegaly    Myocardial infarction (HCC)    Nodule of right lung    Orchitis and epididymitis 06/16/2013   OSA on CPAP    Palindromic rheumatism, hand    Pericardial effusion    Prostatitis    Sepsis (HCC) 08/2022   Subdural hematoma (HCC) 10/22/2015   a.) s/p traumatic mechanical fall --> CT head 10/22/2015: high-density SDH the left cerebral convexity with13 mm midline shift --> s/p LEFT frontal burr hole craniotomy   Tubular adenoma of colon    Ventral hernia     SURGICAL HISTORY: Past Surgical History:  Procedure Laterality Date   BACK SURGERY     BIOPSY  08/21/2022   Procedure: BIOPSY;  Surgeon: Jaynie Collins, DO;  Location: [facility] ENDOSCOPY;  Service: Gastroenterology;;  BIOPSY  08/23/2022   Procedure: BIOPSY;  Surgeon: Jaynie Collins, DO;  Location: [facility] ENDOSCOPY;  Service: Gastroenterology;;   Guss Bunde OF CRANIUM Left 10/22/2015   COLON SURGERY  09/28/2022   COLONOSCOPY WITH PROPOFOL N/A 08/23/2022   Procedure: COLONOSCOPY WITH PROPOFOL;  Surgeon: Jaynie Collins, DO;  Location: [facility] ENDOSCOPY;  Service: Gastroenterology;  Laterality: N/A;   ESOPHAGOGASTRODUODENOSCOPY (EGD) WITH PROPOFOL N/A 08/21/2022   Procedure: ESOPHAGOGASTRODUODENOSCOPY (EGD) WITH PROPOFOL;  Surgeon: Jaynie Collins, DO;  Location[facility] ENDOSCOPY;  Service: Gastroenterology;  Laterality: N/A;   GIVENS CAPSULE STUDY N/A 08/23/2022   Procedure: GIVENS CAPSULE STUDY;  Surgeon: Jaynie Collins, DO;  Location: [facility] ENDOSCOPY;  Service: Gastroenterology;  Laterality: N/A;   IRRIGATION AND  DEBRIDEMENT ABSCESS Left 08/28/2022   Procedure: IRRIGATION AND DEBRIDEMENT ABSCESS LEFT UPPER THIGH/GROIN;  Surgeon: Leafy Ro, MD;  Location: [facility] ORS;  Service: General;  Laterality: Left;   LAPAROSCOPIC RIGHT COLECTOMY Right 09/28/2022   Procedure: LAPAROSCOPIC RIGHT COLECTOMY, RNFA to assist;  Surgeon: Leafy Ro, MD;  Location: [facility] ORS;  Service: General;  Laterality: Right;   POLYPECTOMY  08/23/2022   Procedure: POLYPECTOMY;  Surgeon: Jaynie Collins, DO;  Location: [facility] ENDOSCOPY;  Service: Gastroenterology;;   SUBMUCOSAL TATTOO INJECTION  08/23/2022   Procedure: SUBMUCOSAL TATTOO INJECTION;  Surgeon: Jaynie Collins, DO;  Location: [facility] ENDOSCOPY;  Service: Gastroenterology;;   TONSILLECTOMY     VENTRAL HERNIA REPAIR N/A 09/28/2022   Procedure: HERNIA REPAIR VENTRAL ADULT;  Surgeon: Leafy Ro, MD;  Location: [facility] ORS;  Service: General;  Laterality: N/A;    SOCIAL HISTORY: Social History   Socioeconomic History   Marital status: Married    Spouse name: Not on file   Number of children: Not on file   Years of education: Not on file   Highest education level: 5th grade  Occupational History   Not on file  Tobacco Use   Smoking status: Former    Types: Cigarettes    Passive exposure: Past   Smokeless tobacco: Never   Tobacco comments:    Stop smoking iver 40  plus years ago  Vaping Use   Vaping status: Never Used  Substance and Sexual Activity   Alcohol use: No   Drug use: No   Sexual activity: Not Currently  Other Topics Concern   Not on file  Social History Narrative   Not on file   Social Drivers of Health   Financial Resource Strain: High Risk (12/20/2022)   Overall Financial Resource Strain (CARDIA)    Difficulty of Paying Living Expenses: Hard  Food Insecurity: Food Insecurity Present (12/20/2022)   Hunger Vital Sign    Worried About Running Out of Food in the Last Year: Sometimes true    Ran Out of Food in the Last Year:  Patient declined  Transportation Needs: No Transportation Needs (12/20/2022)   PRAPARE - Administrator, Civil Service (Medical): No    Lack of Transportation (Non-Medical): No  Recent Concern: Transportation Needs - Unmet Transportation Needs (10/21/2022)   Received from [facility] - Transportation    In the past 12 months, has lack of transportation kept you from medical appointments or from getting medications?: No    Lack of Transportation (Non-Medical): Yes  Physical Activity: Unknown (07/26/2022)   Exercise Vital Sign    Days of Exercise per Week: Patient declined    Minutes of Exercise per Session: Not on file  Stress:  Stress Concern Present (07/26/2022)   [employer] of Occupational Health - Occupational Stress Questionnaire    Feeling of Stress : To some extent  Social Connections: Socially Integrated (12/20/2022)   Social Connection and Isolation Panel [NHANES]    Frequency of Communication with Friends and Family: More than three times a week    Frequency of Social Gatherings with Friends and Family: Patient declined    Attends Religious Services: More than 4 times per year    Active Member of [employer] or Organizations: Yes    Attends Banker Meetings: Never    Marital Status: Married  Catering manager Violence: Not At Risk (09/28/2022)   Humiliation, Afraid, Rape, and Kick questionnaire    Fear of Current or Ex-Partner: No    Emotionally Abused: No    Physically Abused: No    Sexually Abused: No    FAMILY HISTORY: Family History  Problem Relation Age of Onset   Cancer Maternal Aunt    ADD / ADHD Daughter    Asthma Son    Prostate cancer Neg Hx    Kidney disease Neg Hx    Kidney cancer Neg Hx    Bladder Cancer Neg Hx     ALLERGIES:  has no known allergies.  MEDICATIONS:  Current Outpatient Medications  Medication Sig Dispense Refill   aspirin EC 81 MG tablet Take 1 tablet (81 mg total) by mouth daily.  Swallow whole.     calcium-vitamin D (OSCAL WITH D) 500-5 MG-MCG tablet Take 1 tablet by mouth daily with breakfast.     cyanocobalamin (VITAMIN B12) 1000 MCG tablet Take 1 tablet (1,000 mcg total) by mouth daily. 90 tablet 0   dapagliflozin propanediol (FARXIGA) 10 MG TABS tablet Take 1 tablet (10 mg total) by mouth daily before breakfast. 90 tablet 3   hydrALAZINE (APRESOLINE) 50 MG tablet Take 2 tablets (100 mg total) by mouth 2 (two) times daily. 180 tablet 3   KLOR-CON M20 20 MEQ tablet Take 20 mEq by mouth at bedtime.     levothyroxine (SYNTHROID) 200 MCG tablet Take 1 tablet (200 mcg total) by mouth daily before breakfast. 90 tablet 0   metFORMIN (GLUCOPHAGE) 500 MG tablet Take 2 tablets (1,000 mg total) by mouth 2 (two) times daily with a meal. 120 tablet 0   methocarbamol (ROBAXIN) 500 MG tablet Take 1 tablet (500 mg total) by mouth every 8 (eight) hours as needed for muscle spasms. 30 tablet 0   metoprolol succinate (TOPROL XL) 25 MG 24 hr tablet Take 1 tablet (25 mg total) by mouth daily. (Patient taking differently: Take 25 mg by mouth every morning.) 90 tablet 3   oxyCODONE-acetaminophen (PERCOCET) 10-325 MG tablet Take 1 tablet by mouth every 8 (eight) hours as needed. 90 tablet 0   predniSONE (DELTASONE) 20 MG tablet Take 20 mg by mouth 2 (two) times daily with a meal.     pregabalin (LYRICA) 50 MG capsule Take 1 capsule (50 mg total) by mouth 3 (three) times daily for 14 days. 42 capsule 0   sacubitril-valsartan (ENTRESTO) 24-26 MG Take 1 tablet by mouth 2 (two) times daily. 180 tablet 3   sildenafil (VIAGRA) 100 MG tablet Take 1 tablet (100 mg total) by mouth daily as needed for erectile dysfunction. Take two hours prior to intercourse on an empty stomach 30 tablet 1   tamsulosin (FLOMAX) 0.4 MG CAPS capsule Take 0.4 mg by mouth daily.     zanubrutinib (BRUKINSA) 80 MG capsule Take 2 capsules (  160 mg total) by mouth 2 (two) times daily. 120 capsule 2   oxyCODONE-acetaminophen  (PERCOCET) 10-325 MG tablet Take 1 tablet by mouth every 8 (eight) hours as needed. (Patient not taking: Reported on 02/18/2023) 90 tablet 0   [START ON 02/19/2023] oxyCODONE-acetaminophen (PERCOCET) 10-325 MG tablet Take 1 tablet by mouth every 8 (eight) hours as needed. (Patient not taking: Reported on 02/18/2023) 90 tablet 0   rosuvastatin (CRESTOR) 40 MG tablet Take 1 tablet (40 mg total) by mouth daily. (Patient taking differently: Take 40 mg by mouth every morning.) 90 tablet 3   No current facility-administered medications for this visit.    Review of Systems  Constitutional:  Positive for fatigue. Negative for appetite change, chills, fever and unexpected weight change.  HENT:   Negative for hearing loss and voice change.   Eyes:  Negative for eye problems and icterus.  Respiratory:  Negative for chest tightness, cough and shortness of breath.   Cardiovascular:  Negative for chest pain and leg swelling.  Gastrointestinal:  Negative for abdominal distention and abdominal pain.  Endocrine: Negative for hot flashes.  Genitourinary:  Negative for difficulty urinating, dysuria and frequency.   Musculoskeletal:  Positive for arthralgias.  Skin:  Negative for itching and rash.  Neurological:  Negative for light-headedness and numbness.  Hematological:  Negative for adenopathy. Does not bruise/bleed easily.  Psychiatric/Behavioral:  Negative for confusion.      PHYSICAL EXAMINATION: ECOG PERFORMANCE STATUS: 1 - Symptomatic but completely ambulatory  Vitals:   02/18/23 0951 02/18/23 0955  BP: (!) 175/74 (!) 159/73  Pulse: 62   Resp: 18   Temp: (!) 96.3 F (35.7 C)    Filed Weights   02/18/23 0951  Weight: 223 lb 4.8 oz (101.3 kg)    Physical Exam Constitutional:      General: He is not in acute distress.    Appearance: He is obese. He is not diaphoretic.  HENT:     Head: Normocephalic and atraumatic.  Eyes:     General: No scleral icterus.    Pupils: Pupils are equal,  round, and reactive to light.  Cardiovascular:     Rate and Rhythm: Normal rate.     Heart sounds: No murmur heard. Pulmonary:     Effort: Pulmonary effort is normal. No respiratory distress.     Breath sounds: No wheezing.  Abdominal:     General: There is no distension.     Palpations: Abdomen is soft.     Comments: Abd wound covered with dressing.   Musculoskeletal:     Cervical back: Normal range of motion and neck supple.  Skin:    General: Skin is warm and dry.     Findings: No erythema.  Neurological:     Mental Status: He is alert and oriented to person, place, and time. Mental status is at baseline.     Cranial Nerves: No cranial nerve deficit.     Motor: No abnormal muscle tone.  Psychiatric:        Mood and Affect: Mood and affect normal.      LABORATORY DATA:  I have reviewed the data as listed    Latest Ref Rng & Units 02/18/2023    9:34 AM 12/22/2022    2:22 PM 12/20/2022    1:03 PM  CBC  WBC 4.0 - 10.5 K/uL 11.9  9.7  10.3   Hemoglobin 13.0 - 17.0 g/dL 8.6  9.1  9.2   Hematocrit 39.0 - 52.0 % 27.8  29.5  29.1   Platelets 150 - 400 K/uL 182  150  162       Latest Ref Rng & Units 02/18/2023    9:34 AM 12/22/2022    2:22 PM 12/20/2022    1:04 PM  CMP  Glucose 70 - 99 mg/dL 259  99  563   BUN 8 - 23 mg/dL 18  12  17    Creatinine 0.61 - 1.24 mg/dL 8.75  6.43  3.29   Sodium 135 - 145 mmol/L 137  146  143   Potassium 3.5 - 5.1 mmol/L 4.2  3.8  3.7   Chloride 98 - 111 mmol/L 103  109  107   CO2 22 - 32 mmol/L 26  25  25    Calcium 8.9 - 10.3 mg/dL 8.5  8.8  9.0   Total Protein 6.5 - 8.1 g/dL 6.2  6.2  6.5   Total Bilirubin 0.0 - 1.2 mg/dL 0.6  0.4  0.8   Alkaline Phos 38 - 126 U/L 84  87  70   AST 15 - 41 U/L 38  74  62   ALT 0 - 44 U/L 31  40  35      RADIOGRAPHIC STUDIES: I have personally reviewed the radiological images as listed and agreed with the findings in the report. CT Cervical Spine Wo Contrast Result Date: 02/13/2023 CLINICAL DATA:   Polytrauma, blunt EXAM: CT CERVICAL SPINE WITHOUT CONTRAST TECHNIQUE: Multidetector CT imaging of the cervical spine was performed without intravenous contrast. Multiplanar CT image reconstructions were also generated. RADIATION DOSE REDUCTION: This exam was performed according to the departmental dose-optimization program which includes automated exposure control, adjustment of the mA and/or kV according to patient size and/or use of iterative reconstruction technique. COMPARISON:  08/26/2022 FINDINGS: Alignment: Facet joints are aligned without dislocation or traumatic listhesis. Dens and lateral masses are aligned. Smooth reversal of the cervical lordosis. Skull base and vertebrae: No acute fracture. No primary bone lesion or focal pathologic process. Soft tissues and spinal canal: No prevertebral fluid or swelling. No visible canal hematoma. Disc levels: Advanced multilevel cervical spondylosis, similar to prior. Upper chest: Negative. Other: Bilateral carotid atherosclerosis. IMPRESSION: 1. No acute fracture or traumatic listhesis of the cervical spine. 2. Advanced multilevel cervical spondylosis. Electronically Signed   By: Duanne Guess D.O.   On: 02/13/2023 15:08   CT Head Wo Contrast Result Date: 02/13/2023 CLINICAL DATA:  Head trauma, minor (Age >= 65y) EXAM: CT HEAD WITHOUT CONTRAST TECHNIQUE: Contiguous axial images were obtained from the base of the skull through the vertex without intravenous contrast. RADIATION DOSE REDUCTION: This exam was performed according to the departmental dose-optimization program which includes automated exposure control, adjustment of the mA and/or kV according to patient size and/or use of iterative reconstruction technique. COMPARISON:  08/26/2022 FINDINGS: Brain: No evidence of acute infarction, hemorrhage, hydrocephalus, extra-axial collection or mass lesion/mass effect. Vascular: No hyperdense vessel or unexpected calcification. Skull: Left frontal burr hole.  No  acute fracture. Sinuses/Orbits: Air-fluid level in the left maxillary sinus. Other: Negative for scalp hematoma. IMPRESSION: 1. No acute intracranial abnormality. 2. Air-fluid level in the left maxillary sinus. Correlate for acute sinusitis. Electronically Signed   By: Duanne Guess D.O.   On: 02/13/2023 15:03   DG Lumbar Spine 2-3 Views Result Date: 02/13/2023 CLINICAL DATA:  Pain after fall EXAM: LUMBAR SPINE - 2-3 VIEW COMPARISON:  06/02/2022 FINDINGS: There is no evidence of lumbar spine fracture. Grade 1 anterolisthesis of L4 on L5.  Mild retrolisthesis at L5-S1. Multilevel disc height loss. Lower lumbar facet arthropathy. Aortic atherosclerosis. IMPRESSION: 1. No acute fracture or traumatic listhesis of the lumbar spine. 2. Multilevel degenerative changes. Electronically Signed   By: Duanne Guess D.O.   On: 02/13/2023 15:01   DG Shoulder Right Result Date: 02/13/2023 CLINICAL DATA:  Shoulder pain after fall EXAM: RIGHT SHOULDER - 2+ VIEW COMPARISON:  04/04/2017 FINDINGS: No acute fracture or dislocation. High-riding humeral head suggestive of underlying rotator cuff insufficiency. Moderate degenerative changes of the shoulder, progressed since 2019. No focal soft tissue abnormality. IMPRESSION: 1. No acute fracture or dislocation of the right shoulder. 2. Moderate degenerative changes of the shoulder, progressed since 2019. Electronically Signed   By: Duanne Guess D.O.   On: 02/13/2023 14:59

## 2023-02-18 NOTE — Assessment & Plan Note (Addendum)
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 Lab Results  Component Value Date   HGB 8.6 (L) 02/18/2023   TIBC 342 02/18/2023   IRONPCTSAT 6 (L) 02/18/2023   FERRITIN 12 (L) 02/18/2023    Hemoglobin has dropped and he developed recurrent iron deficiency anemia.  Recommend IV Venofer weekly x 4.

## 2023-02-18 NOTE — Assessment & Plan Note (Signed)
Continue treatment as mentioned above.

## 2023-02-18 NOTE — Telephone Encounter (Signed)
Call made to pt. No answer. Detailed message left with plan. Please schedule and inform pt of appt details:   Venofer weekly x4 (first dose asap) 8 weeks: labs prior to MD/ venofer

## 2023-02-18 NOTE — Assessment & Plan Note (Addendum)
He developed recurrent iron deficiency anemia.  Recommend follow up with GI for repeat colonoscopy.  I will send message to Dr. Timothy Lasso.

## 2023-02-22 DIAGNOSIS — M19011 Primary osteoarthritis, right shoulder: Secondary | ICD-10-CM | POA: Diagnosis not present

## 2023-02-22 DIAGNOSIS — M75121 Complete rotator cuff tear or rupture of right shoulder, not specified as traumatic: Secondary | ICD-10-CM | POA: Diagnosis not present

## 2023-02-23 ENCOUNTER — Inpatient Hospital Stay: Payer: No Typology Code available for payment source

## 2023-02-23 VITALS — BP 164/73 | HR 60 | Temp 98.0°F | Resp 17

## 2023-02-23 DIAGNOSIS — C9110 Chronic lymphocytic leukemia of B-cell type not having achieved remission: Secondary | ICD-10-CM | POA: Diagnosis not present

## 2023-02-23 MED ORDER — IRON SUCROSE 20 MG/ML IV SOLN
200.0000 mg | Freq: Once | INTRAVENOUS | Status: AC
  Administered 2023-02-23: 200 mg via INTRAVENOUS
  Filled 2023-02-23: qty 10

## 2023-02-23 NOTE — Patient Instructions (Signed)
Iron Sucrose Injection What is this medication? IRON SUCROSE (EYE ern SOO krose) treats low levels of iron (iron deficiency anemia) in people with kidney disease. Iron is a mineral that plays an important role in making red blood cells, which carry oxygen from your lungs to the rest of your body. This medicine may be used for other purposes; ask your health care provider or pharmacist if you have questions. COMMON BRAND NAME(S): Venofer What should I tell my care team before I take this medication? They need to know if you have any of these conditions: Anemia not caused by low iron levels Heart disease High levels of iron in the blood Kidney disease Liver disease An unusual or allergic reaction to iron, other medications, foods, dyes, or preservatives Pregnant or trying to get pregnant Breastfeeding How should I use this medication? This medication is for infusion into a vein. It is given in a hospital or clinic setting. Talk to your care team about the use of this medication in children. While this medication may be prescribed for children as young as 2 years for selected conditions, precautions do apply. Overdosage: If you think you have taken too much of this medicine contact a poison control center or emergency room at once. NOTE: This medicine is only for you. Do not share this medicine with others. What if I miss a dose? Keep appointments for follow-up doses. It is important not to miss your dose. Call your care team if you are unable to keep an appointment. What may interact with this medication? Do not take this medication with any of the following: Deferoxamine Dimercaprol Other iron products This medication may also interact with the following: Chloramphenicol Deferasirox This list may not describe all possible interactions. Give your health care provider a list of all the medicines, herbs, non-prescription drugs, or dietary supplements you use. Also tell them if you smoke,  drink alcohol, or use illegal drugs. Some items may interact with your medicine. What should I watch for while using this medication? Visit your care team regularly. Tell your care team if your symptoms do not start to get better or if they get worse. You may need blood work done while you are taking this medication. You may need to follow a special diet. Talk to your care team. Foods that contain iron include: whole grains/cereals, dried fruits, beans, or peas, leafy green vegetables, and organ meats (liver, kidney). What side effects may I notice from receiving this medication? Side effects that you should report to your care team as soon as possible: Allergic reactions--skin rash, itching, hives, swelling of the face, lips, tongue, or throat Low blood pressure--dizziness, feeling faint or lightheaded, blurry vision Shortness of breath Side effects that usually do not require medical attention (report to your care team if they continue or are bothersome): Flushing Headache Joint pain Muscle pain Nausea Pain, redness, or irritation at injection site This list may not describe all possible side effects. Call your doctor for medical advice about side effects. You may report side effects to FDA at 1-800-FDA-1088. Where should I keep my medication? This medication is given in a hospital or clinic. It will not be stored at home. NOTE: This sheet is a summary. It may not cover all possible information. If you have questions about this medicine, talk to your doctor, pharmacist, or health care provider.  2024 Elsevier/Gold Standard (2022-06-18 00:00:00)

## 2023-02-24 ENCOUNTER — Encounter: Payer: Self-pay | Admitting: Oncology

## 2023-02-24 DIAGNOSIS — D509 Iron deficiency anemia, unspecified: Secondary | ICD-10-CM | POA: Diagnosis not present

## 2023-02-25 ENCOUNTER — Encounter: Payer: Self-pay | Admitting: Oncology

## 2023-02-28 ENCOUNTER — Ambulatory Visit
Admission: RE | Admit: 2023-02-28 | Discharge: 2023-02-28 | Disposition: A | Discharge status: Home / Self Care | Payer: No Typology Code available for payment source | Attending: Gastroenterology | Admitting: Gastroenterology

## 2023-02-28 ENCOUNTER — Encounter
Admission: RE | Disposition: A | Discharge status: Home / Self Care | Payer: Self-pay | Source: Home / Self Care | Attending: Gastroenterology

## 2023-02-28 ENCOUNTER — Ambulatory Visit: Payer: No Typology Code available for payment source | Admitting: Anesthesiology

## 2023-02-28 ENCOUNTER — Encounter: Payer: Self-pay | Admitting: Gastroenterology

## 2023-02-28 DIAGNOSIS — E1122 Type 2 diabetes mellitus with diabetic chronic kidney disease: Secondary | ICD-10-CM | POA: Insufficient documentation

## 2023-02-28 DIAGNOSIS — I509 Heart failure, unspecified: Secondary | ICD-10-CM | POA: Diagnosis not present

## 2023-02-28 DIAGNOSIS — K2100 Gastro-esophageal reflux disease with esophagitis, without bleeding: Secondary | ICD-10-CM | POA: Insufficient documentation

## 2023-02-28 DIAGNOSIS — I2510 Atherosclerotic heart disease of native coronary artery without angina pectoris: Secondary | ICD-10-CM | POA: Insufficient documentation

## 2023-02-28 DIAGNOSIS — N183 Chronic kidney disease, stage 3 unspecified: Secondary | ICD-10-CM | POA: Diagnosis not present

## 2023-02-28 DIAGNOSIS — K5730 Diverticulosis of large intestine without perforation or abscess without bleeding: Secondary | ICD-10-CM | POA: Diagnosis not present

## 2023-02-28 DIAGNOSIS — Z09 Encounter for follow-up examination after completed treatment for conditions other than malignant neoplasm: Secondary | ICD-10-CM | POA: Diagnosis not present

## 2023-02-28 DIAGNOSIS — Z79890 Hormone replacement therapy (postmenopausal): Secondary | ICD-10-CM | POA: Diagnosis not present

## 2023-02-28 DIAGNOSIS — Z87891 Personal history of nicotine dependence: Secondary | ICD-10-CM | POA: Diagnosis not present

## 2023-02-28 DIAGNOSIS — K635 Polyp of colon: Secondary | ICD-10-CM | POA: Diagnosis not present

## 2023-02-28 DIAGNOSIS — Z538 Procedure and treatment not carried out for other reasons: Secondary | ICD-10-CM | POA: Diagnosis not present

## 2023-02-28 DIAGNOSIS — E039 Hypothyroidism, unspecified: Secondary | ICD-10-CM | POA: Insufficient documentation

## 2023-02-28 DIAGNOSIS — Z1211 Encounter for screening for malignant neoplasm of colon: Secondary | ICD-10-CM | POA: Insufficient documentation

## 2023-02-28 DIAGNOSIS — D509 Iron deficiency anemia, unspecified: Secondary | ICD-10-CM | POA: Diagnosis not present

## 2023-02-28 DIAGNOSIS — D631 Anemia in chronic kidney disease: Secondary | ICD-10-CM | POA: Diagnosis not present

## 2023-02-28 DIAGNOSIS — K449 Diaphragmatic hernia without obstruction or gangrene: Secondary | ICD-10-CM | POA: Insufficient documentation

## 2023-02-28 DIAGNOSIS — Z860101 Personal history of adenomatous and serrated colon polyps: Secondary | ICD-10-CM | POA: Diagnosis not present

## 2023-02-28 DIAGNOSIS — I130 Hypertensive heart and chronic kidney disease with heart failure and stage 1 through stage 4 chronic kidney disease, or unspecified chronic kidney disease: Secondary | ICD-10-CM | POA: Insufficient documentation

## 2023-02-28 DIAGNOSIS — D128 Benign neoplasm of rectum: Secondary | ICD-10-CM | POA: Insufficient documentation

## 2023-02-28 DIAGNOSIS — K209 Esophagitis, unspecified without bleeding: Secondary | ICD-10-CM | POA: Diagnosis not present

## 2023-02-28 HISTORY — PX: BIOPSY: SHX5522

## 2023-02-28 HISTORY — PX: ESOPHAGOGASTRODUODENOSCOPY (EGD) WITH PROPOFOL: SHX5813

## 2023-02-28 HISTORY — PX: COLONOSCOPY WITH PROPOFOL: SHX5780

## 2023-02-28 LAB — GLUCOSE, CAPILLARY: Glucose-Capillary: 88 mg/dL (ref 70–99)

## 2023-02-28 SURGERY — COLONOSCOPY WITH PROPOFOL
Anesthesia: General

## 2023-02-28 MED ORDER — PROPOFOL 500 MG/50ML IV EMUL
INTRAVENOUS | Status: DC | PRN
  Administered 2023-02-28: 75 ug/kg/min via INTRAVENOUS

## 2023-02-28 MED ORDER — DEXMEDETOMIDINE HCL IN NACL 80 MCG/20ML IV SOLN
INTRAVENOUS | Status: DC | PRN
  Administered 2023-02-28: 20 ug via INTRAVENOUS

## 2023-02-28 MED ORDER — LIDOCAINE HCL (CARDIAC) PF 100 MG/5ML IV SOSY
PREFILLED_SYRINGE | INTRAVENOUS | Status: DC | PRN
  Administered 2023-02-28: 80 mg via INTRAVENOUS

## 2023-02-28 MED ORDER — PROPOFOL 10 MG/ML IV BOLUS
INTRAVENOUS | Status: DC | PRN
  Administered 2023-02-28 (×2): 50 mg via INTRAVENOUS

## 2023-02-28 MED ORDER — GLYCOPYRROLATE 0.2 MG/ML IJ SOLN
INTRAMUSCULAR | Status: DC | PRN
  Administered 2023-02-28: .2 mg via INTRAVENOUS

## 2023-02-28 MED ORDER — GLYCOPYRROLATE 0.2 MG/ML IJ SOLN
INTRAMUSCULAR | Status: AC
Start: 2023-02-28 — End: ?
  Filled 2023-02-28: qty 1

## 2023-02-28 MED ORDER — SODIUM CHLORIDE 0.9 % IV SOLN
INTRAVENOUS | Status: DC
Start: 2023-02-28 — End: 2023-02-28
  Administered 2023-02-28: 20 mL/h via INTRAVENOUS

## 2023-02-28 NOTE — Transfer of Care (Signed)
Immediate Anesthesia Transfer of Care Note  Patient: Paul Ake [name]  Procedure(s) Performed: COLONOSCOPY WITH PROPOFOL ESOPHAGOGASTRODUODENOSCOPY (EGD) WITH PROPOFOL BIOPSY  Patient Location: PACU  Anesthesia Type:General  Level of Consciousness: sedated  Airway & Oxygen Therapy: Patient Spontanous Breathing  Post-op Assessment: Report given to RN and Post -op Vital signs reviewed and stable  Post vital signs: Reviewed and stable  Last Vitals:  Vitals Value Taken Time  BP 138/84 02/28/23 0850  Temp 36.1 C 02/28/23 0849  Pulse 64 02/28/23 0851  Resp 18 02/28/23 0849  SpO2 96 % 02/28/23 0851  Vitals shown include unfiled device data.  Last Pain:  Vitals:   02/28/23 0849  TempSrc: Temporal  PainSc: Asleep         Complications: No notable events documented.

## 2023-02-28 NOTE — Op Note (Signed)
[facility] Patient Name: Paul Briggs Procedure Date: 02/28/2023 8:13 AM MRN: [id number] Account #: [id number] Date of Birth: 19-Mar-1945 Admit Type: Outpatient Age: 78 Room: [facility] ENDO ROOM 2 Gender: Male Note Status: Finalized Instrument Name: Upper Endoscope [id number] Procedure:             Upper GI endoscopy Indications:           Iron deficiency anemia Providers:             Trenda Moots, DO Referring MD:          Daryl Eastern. Suzie Portela (Referring MD) Medicines:             Monitored Anesthesia Care Complications:         No immediate complications. Estimated blood loss:                         Minimal. Procedure:             Pre-Anesthesia Assessment:                        - Prior to the procedure, a History and Physical was                         performed, and patient medications and allergies were                         reviewed. The patient is competent. The risks and                         benefits of the procedure and the sedation options and                         risks were discussed with the patient. All questions                         were answered and informed consent was obtained.                         Patient identification and proposed procedure were                         verified by the physician, the nurse, the anesthetist                         and the technician in the endoscopy suite. Mental                         Status Examination: alert and oriented. Airway                         Examination: normal oropharyngeal airway and neck                         mobility. Respiratory Examination: clear to                         auscultation. CV Examination: regular rate and rhythm.  Prophylactic Antibiotics: The patient does not require                         prophylactic antibiotics. Prior Anticoagulants: The                         patient has taken no anticoagulant or antiplatelet                          agents. ASA Grade Assessment: III - A patient with                         severe systemic disease. After reviewing the risks and                         benefits, the patient was deemed in satisfactory                         condition to undergo the procedure. The anesthesia                         plan was to use monitored anesthesia care (MAC).                         Immediately prior to administration of medications,                         the patient was re-assessed for adequacy to receive                         sedatives. The heart rate, respiratory rate, oxygen                         saturations, blood pressure, adequacy of pulmonary                         ventilation, and response to care were monitored                         throughout the procedure. The physical status of the                         patient was re-assessed after the procedure.                        After obtaining informed consent, the endoscope was                         passed under direct vision. Throughout the procedure,                         the patient's blood pressure, pulse, and oxygen                         saturations were monitored continuously. The Endoscope                         was introduced through the mouth, and advanced to the  third part of duodenum. The upper GI endoscopy was                         accomplished without difficulty. The patient tolerated                         the procedure well. Findings:      The duodenal bulb, first portion of the duodenum, second portion of the       duodenum and third portion of the duodenum were normal. Biopsies for       histology were taken with a cold forceps for evaluation of celiac       disease. Estimated blood loss was minimal.      A small hiatal hernia was present. Estimated blood loss: none.      The entire examined stomach was normal. Biopsies were taken with a cold       forceps for  Helicobacter pylori testing. Estimated blood loss was       minimal.      LA Grade A (one or more mucosal breaks less than 5 mm, not extending       between tops of 2 mucosal folds) esophagitis with no bleeding was found.       Estimated blood loss: none.      The Z-line was variable but less than 1cm in length. Imaging was       performed using white light and narrow band imaging to visualize the       mucosa. Estimated blood loss: none.      Esophagogastric landmarks were identified: the gastroesophageal junction       was found at 42 cm from the incisors.      The exam of the esophagus was otherwise normal. Impression:            - Normal duodenal bulb, first portion of the duodenum,                         second portion of the duodenum and third portion of                         the duodenum. Biopsied.                        - Small hiatal hernia.                        - Normal stomach. Biopsied.                        - LA Grade A reflux esophagitis with no bleeding.                        - Z-line variable but less than 1cm in length.                        - Esophagogastric landmarks identified. Recommendation:        - Patient has a contact number available for                         emergencies. The signs and symptoms of potential  delayed complications were discussed with the patient.                         Return to normal activities tomorrow. Written                         discharge instructions were provided to the patient.                        - Discharge patient to home.                        - Resume previous diet.                        - Continue present medications.                        - continue or start proton pump inhibitor                        - Await pathology results.                        - Return to GI clinic as previously scheduled.                        - proceed with colonoscopy. see report for further                          recommendations.                        - The findings and recommendations were discussed with                         the patient. Procedure Code(s):     --- Professional ---                        [phone], Esophagogastroduodenoscopy, flexible,                         transoral; with biopsy, single or multiple Diagnosis Code(s):     --- Professional ---                        K44.9, Diaphragmatic hernia without obstruction or                         gangrene                        K21.00, Gastro-esophageal reflux disease with                         esophagitis, without bleeding                        D50.9, Iron deficiency anemia, unspecified CPT copyright 2022 American Medical Association. All rights reserved. The codes documented in this report are preliminary and upon coder review may  be revised to meet current compliance requirements. Attending Participation:  I personally performed the entire procedure. Elfredia Nevins, DO Jaynie Collins DO, DO 02/28/2023 8:37:55 AM This report has been signed electronically. Number of Addenda: 0 Note Initiated On: 02/28/2023 8:13 AM Estimated Blood Loss:  Estimated blood loss was minimal.      [facility]

## 2023-02-28 NOTE — H&P (Signed)
Pre-Procedure H&P   Patient ID: Paul Reisz [name] is a 78 y.o. male.  Gastroenterology Provider: Jaynie Collins, DO  Referring Provider: Tawni Pummel, PA; [name], MD PCP: Wilford Corner, PA-C  Date: 02/28/2023  HPI Mr. Paul Stan [name] is a 78 y.o. male who presents today for Esophagogastroduodenoscopy and Colonoscopy for Iron deficiency anemia, personal history of advanced colon polyps .  Patient with a history of advanced colon polyps and iron deficiency anemia.  He underwent EGD and colonoscopy in July 2024 demonstrating a small hiatal hernia.  On colonoscopy was found to have a tubulovillous adenoma, ulcerated appearing ascending colon mass and is now status post hemicolectomy and multiple other tubular adenomas.  Unfortunately his bowel prep was poor at that time and he was recommended follow-up colonoscopy after his surgery. Left-sided diverticulosis and also noted at that time  Started developing iron deficiency once again and is due for repeat colonoscopy  Reports occasional BC powders.  Currently on iron infusion with his hematologist oncologist   Past Medical History:  Diagnosis Date   Adenoma of left adrenal gland    Anemia    Aortic atherosclerosis (HCC)    Blood transfusion without reported diagnosis    BPH (benign prostatic hyperplasia)    Bradycardia    CAD (coronary artery disease) 08/26/2022   a.) cCTA 08/26/2022: Ca2+ 14.3 (12th %'ile for age/sex.race match control); (<25%) pLAD   Cellulitis of left thigh 08/2022   CHF (congestive heart failure) (HCC)    a.) TTE 10/23/2015: EF 60-65%, mod-sev LVH, mild biatrial dil, degen MV disease, AoV sclerosis, asc Ao 38 mm, G1DD; b.) TTE 08/05/2022: EF 45-50%, mod-sev LVH (speckled pattern), sev biatrial dil, mild MR, AoV sclerosis, G1DD   Chronic pain syndrome    a.) on COT managed by pain management   Chronic, continuous use of opioids    a.) chronic pain syndrome/chronic back pain;  managed by pain management   CKD (chronic kidney disease) stage 3, GFR 30-59 ml/min (HCC)    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   a.) Rai stage III   DDD (degenerative disc disease), cervical    Difficult airway 10/22/2015   a.) 1st attempt with glidescope  --> macroglossic (unsuccessful); b.) 2nd attempt with Mac 4 and direct laryngoscopy with a 8.0 ETT --> tube too large to pass through the cords; b.) 3rd attempt (successful) with a Mac 4 and a 7.5 tube   Diverticulosis    DM (diabetes mellitus), type 2 (HCC)    Dysplasia of prostate    Erectile dysfunction    Frequent falls    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   History of MRSA infection 08/28/2022   a.) MRSA PCR (+) 08/28/2022; culture from LEFT thigh abscess   HTN (hypertension)    Hyperlipidemia    Hypogonadism in male    Hypothyroidism    IDA (iron deficiency anemia)    LBBB (left bundle branch block)    Long-term use of aspirin therapy    Lumbar spinal stenosis    Mild cardiomegaly    Myocardial infarction (HCC)    Nodule of right lung    Orchitis and epididymitis 06/16/2013   OSA on CPAP    Palindromic rheumatism, hand    Pericardial effusion  Prostatitis    Sepsis (HCC) 08/2022   Subdural hematoma (HCC) 10/22/2015   a.) s/p traumatic mechanical fall --> CT head 10/22/2015: high-density SDH the left cerebral convexity with13 mm midline shift --> s/p LEFT frontal burr hole craniotomy   Tubular adenoma of colon    Ventral hernia     Past Surgical History:  Procedure Laterality Date   BACK SURGERY     BIOPSY  08/21/2022   Procedure: BIOPSY;  Surgeon: Jaynie Collins, DO;  Location: [facility] ENDOSCOPY;  Service: Gastroenterology;;   BIOPSY  08/23/2022   Procedure:  BIOPSY;  Surgeon: Jaynie Collins, DO;  Location: [facility] ENDOSCOPY;  Service: Gastroenterology;;   Guss Bunde OF CRANIUM Left 10/22/2015   COLON SURGERY  09/28/2022   COLONOSCOPY WITH PROPOFOL N/A 08/23/2022   Procedure: COLONOSCOPY WITH PROPOFOL;  Surgeon: Jaynie Collins, DO;  Location: [facility] ENDOSCOPY;  Service: Gastroenterology;  Laterality: N/A;   ESOPHAGOGASTRODUODENOSCOPY (EGD) WITH PROPOFOL N/A 08/21/2022   Procedure: ESOPHAGOGASTRODUODENOSCOPY (EGD) WITH PROPOFOL;  Surgeon: Jaynie Collins, DO;  Location: [facility] ENDOSCOPY;  Service: Gastroenterology;  Laterality: N/A;   GIVENS CAPSULE STUDY N/A 08/23/2022   Procedure: GIVENS CAPSULE STUDY;  Surgeon: Jaynie Collins, DO;  Location: [facility] ENDOSCOPY;  Service: Gastroenterology;  Laterality: N/A;   IRRIGATION AND DEBRIDEMENT ABSCESS Left 08/28/2022   Procedure: IRRIGATION AND DEBRIDEMENT ABSCESS LEFT UPPER THIGH/GROIN;  Surgeon: Leafy Ro, MD;  Location: [facility] ORS;  Service: General;  Laterality: Left;   LAPAROSCOPIC RIGHT COLECTOMY Right 09/28/2022   Procedure: LAPAROSCOPIC RIGHT COLECTOMY, RNFA to assist;  Surgeon: Leafy Ro, MD;  Location: [facility] ORS;  Service: General;  Laterality: Right;   POLYPECTOMY  08/23/2022   Procedure: POLYPECTOMY;  Surgeon: Jaynie Collins, DO;  Location: [facility] ENDOSCOPY;  Service: Gastroenterology;;   SUBMUCOSAL TATTOO INJECTION  08/23/2022   Procedure: SUBMUCOSAL TATTOO INJECTION;  Surgeon: Jaynie Collins, DO;  Location: [facility] ENDOSCOPY;  Service: Gastroenterology;;   TONSILLECTOMY     VENTRAL HERNIA REPAIR N/A 09/28/2022   Procedure: HERNIA REPAIR VENTRAL ADULT;  Surgeon: Leafy Ro, MD;  Location: [facility] ORS;  Service: General;  Laterality: N/A;    Family History No h/o GI disease or malignancy  Review of Systems  Constitutional:  Negative for activity change, appetite change, chills, diaphoresis, fatigue, fever and unexpected weight change.  HENT:  Negative for  trouble swallowing and voice change.   Respiratory:  Negative for shortness of breath and wheezing.   Cardiovascular:  Negative for chest pain, palpitations and leg swelling.  Gastrointestinal:  Negative for abdominal distention, abdominal pain, anal bleeding, blood in stool, constipation, diarrhea, nausea and vomiting.  Musculoskeletal:  Negative for arthralgias and myalgias.  Skin:  Negative for color change and pallor.  Neurological:  Negative for dizziness, syncope and weakness.  Psychiatric/Behavioral:  Negative for confusion. The patient is not nervous/anxious.   All other systems reviewed and are negative.    Medications No current facility-administered medications on file prior to encounter.   Current Outpatient Medications on File Prior to Encounter  Medication Sig Dispense Refill   aspirin EC 81 MG tablet Take 1 tablet (81 mg total) by mouth daily. Swallow whole.     calcium-vitamin D (OSCAL WITH D) 500-5 MG-MCG tablet Take 1 tablet by mouth daily with breakfast.     cyanocobalamin (VITAMIN B12) 1000 MCG tablet Take 1 tablet (1,000 mcg total) by mouth daily. 90 tablet 0   dapagliflozin propanediol (FARXIGA) 10 MG TABS tablet Take 1 tablet (10 mg total)  by mouth daily before breakfast. 90 tablet 3   hydrALAZINE (APRESOLINE) 50 MG tablet Take 2 tablets (100 mg total) by mouth 2 (two) times daily. 180 tablet 3   KLOR-CON M20 20 MEQ tablet Take 20 mEq by mouth at bedtime.     levothyroxine (SYNTHROID) 200 MCG tablet Take 1 tablet (200 mcg total) by mouth daily before breakfast. 90 tablet 0   metFORMIN (GLUCOPHAGE) 500 MG tablet Take 2 tablets (1,000 mg total) by mouth 2 (two) times daily with a meal. 120 tablet 0   methocarbamol (ROBAXIN) 500 MG tablet Take 1 tablet (500 mg total) by mouth every 8 (eight) hours as needed for muscle spasms. 30 tablet 0   metoprolol succinate (TOPROL XL) 25 MG 24 hr tablet Take 1 tablet (25 mg total) by mouth daily. (Patient taking differently: Take 25  mg by mouth every morning.) 90 tablet 3   sacubitril-valsartan (ENTRESTO) 24-26 MG Take 1 tablet by mouth 2 (two) times daily. 180 tablet 3   sildenafil (VIAGRA) 100 MG tablet Take 1 tablet (100 mg total) by mouth daily as needed for erectile dysfunction. Take two hours prior to intercourse on an empty stomach 30 tablet 1   zanubrutinib (BRUKINSA) 80 MG capsule Take 2 capsules (160 mg total) by mouth 2 (two) times daily. 120 capsule 2   oxyCODONE-acetaminophen (PERCOCET) 10-325 MG tablet Take 1 tablet by mouth every 8 (eight) hours as needed. (Patient not taking: Reported on 02/18/2023) 90 tablet 0   oxyCODONE-acetaminophen (PERCOCET) 10-325 MG tablet Take 1 tablet by mouth every 8 (eight) hours as needed. 90 tablet 0   oxyCODONE-acetaminophen (PERCOCET) 10-325 MG tablet Take 1 tablet by mouth every 8 (eight) hours as needed. (Patient not taking: Reported on 02/18/2023) 90 tablet 0   pregabalin (LYRICA) 50 MG capsule Take 1 capsule (50 mg total) by mouth 3 (three) times daily for 14 days. 42 capsule 0   rosuvastatin (CRESTOR) 40 MG tablet Take 1 tablet (40 mg total) by mouth daily. (Patient taking differently: Take 40 mg by mouth every morning.) 90 tablet 3   tamsulosin (FLOMAX) 0.4 MG CAPS capsule Take 0.4 mg by mouth daily.      Pertinent medications related to GI and procedure were reviewed by me with the patient prior to the procedure   Current Facility-Administered Medications:    0.9 %  sodium chloride infusion, , Intravenous, Continuous, Jaynie Collins, DO, Last Rate: 20 mL/hr at 02/28/23 6045, Continued from Pre-op at 02/28/23 0754  sodium chloride 20 mL/hr at 02/28/23 0754       No Known Allergies Allergies were reviewed by me prior to the procedure  Objective   Body mass index is 28.63 kg/m. Vitals:   02/28/23 0719  BP: (!) 168/99  Pulse: 62  Resp: 18  Temp: (!) 97 F (36.1 C)  TempSrc: Temporal  SpO2: 100%  Weight: 98.4 kg  Height: 6\' 1"  (1.854 m)      Physical Exam Vitals and nursing note reviewed.  Constitutional:      General: He is not in acute distress.    Appearance: Normal appearance. He is not ill-appearing, toxic-appearing or diaphoretic.  HENT:     Head: Normocephalic and atraumatic.     Nose: Nose normal.     Mouth/Throat:     Mouth: Mucous membranes are moist.     Pharynx: Oropharynx is clear.     Comments: Poor dentition Eyes:     General: No scleral icterus.    Extraocular Movements:  Extraocular movements intact.  Cardiovascular:     Rate and Rhythm: Normal rate and regular rhythm.     Heart sounds: Normal heart sounds. No murmur heard.    No friction rub. No gallop.  Pulmonary:     Effort: Pulmonary effort is normal. No respiratory distress.     Breath sounds: Normal breath sounds. No wheezing, rhonchi or rales.  Abdominal:     General: Bowel sounds are normal. There is no distension.     Palpations: Abdomen is soft.     Tenderness: There is no abdominal tenderness. There is no guarding or rebound.  Musculoskeletal:     Cervical back: Neck supple.     Right lower leg: No edema.     Left lower leg: No edema.  Skin:    General: Skin is warm and dry.     Coloration: Skin is not jaundiced or pale.  Neurological:     General: No focal deficit present.     Mental Status: He is alert and oriented to person, place, and time. Mental status is at baseline.  Psychiatric:        Mood and Affect: Mood normal.        Behavior: Behavior normal.        Thought Content: Thought content normal.        Judgment: Judgment normal.      Assessment:  Mr. Paul Amero [name] is a 78 y.o. male  who presents today for Esophagogastroduodenoscopy and Colonoscopy for Iron deficiency anemia, personal history of advanced colon polyps .  Plan:  Esophagogastroduodenoscopy and Colonoscopy with possible intervention today  Esophagogastroduodenoscopy and Colonoscopy with possible biopsy, control of bleeding, polypectomy,  and interventions as necessary has been discussed with the patient/patient representative. Informed consent was obtained from the patient/patient representative after explaining the indication, nature, and risks of the procedure including but not limited to death, bleeding, perforation, missed neoplasm/lesions, cardiorespiratory compromise, and reaction to medications. Opportunity for questions was given and appropriate answers were provided. Patient/patient representative has verbalized understanding is amenable to undergoing the procedure.   Jaynie Collins, DO  [facility]  Portions of the record may have been created with voice recognition software. Occasional wrong-word or 'sound-a-like' substitutions may have occurred due to the inherent limitations of voice recognition software.  Read the chart carefully and recognize, using context, where substitutions may have occurred.

## 2023-02-28 NOTE — Anesthesia Preprocedure Evaluation (Signed)
Anesthesia Evaluation  Patient identified by MRN, date of birth, ID band Patient awake    Reviewed: Allergy & Precautions, NPO status , Patient's Chart, lab work & pertinent test results  History of Anesthesia Complications Negative for: history of anesthetic complications  Airway Mallampati: III  TM Distance: >3 FB Neck ROM: full    Dental  (+) Chipped, Poor Dentition, Missing, Dental Advidsory Given   Pulmonary neg shortness of breath, sleep apnea , neg COPD, neg recent URI, former smoker   Pulmonary exam normal        Cardiovascular Exercise Tolerance: Good hypertension, (-) angina + CAD and +CHF  (-) Past MI and (-) Cardiac Stents Normal cardiovascular exam+ dysrhythmias (-) Valvular Problems/Murmurs     Neuro/Psych  Headaches, neg Seizures  Neuromuscular disease  negative psych ROS   GI/Hepatic negative GI ROS, Neg liver ROS,,,  Endo/Other  diabetes, Type 2Hypothyroidism    Renal/GU CRFRenal disease     Musculoskeletal   Abdominal   Peds  Hematology negative hematology ROS (+)   Anesthesia Other Findings Past Medical History: No date: Adenoma of left adrenal gland No date: Aortic atherosclerosis (HCC) No date: BPH (benign prostatic hyperplasia) No date: Bradycardia 08/26/2022: CAD (coronary artery disease)     Comment:  a.) cCTA 08/26/2022: Ca2+ 14.3 (12th %'ile for               age/sex.race match control); (<25%) pLAD 08/2022: Cellulitis of left thigh No date: CHF (congestive heart failure) (HCC)     Comment:  a.) TTE 10/23/2015: EF 60-65%, mod-sev LVH, mild               biatrial dil, degen MV disease, AoV sclerosis, asc Ao 38               mm, G1DD; b.) TTE 08/05/2022: EF 45-50%, mod-sev LVH               (speckled pattern), sev biatrial dil, mild MR, AoV               sclerosis, G1DD No date: Chronic pain syndrome     Comment:  a.) on COT managed by pain management No date: Chronic, continuous use  of opioids     Comment:  a.) chronic pain syndrome/chronic back pain; managed by               pain management No date: CKD (chronic kidney disease) stage 3, GFR 30-59 ml/min (HCC) 12/28/2021: CLL (chronic lymphocytic leukemia) (HCC)     Comment:  a.) Rai stage III No date: DDD (degenerative disc disease), cervical 10/22/2015: Difficult airway     Comment:  a.) 1st attempt with glidescope  --> macroglossic               (unsuccessful); b.) 2nd attempt with Mac 4 and direct               laryngoscopy with a 8.0 ETT --> tube too large to pass               through the cords; b.) 3rd attempt (successful) with a               Mac 4 and a 7.5 tube No date: Diverticulosis No date: DM (diabetes mellitus), type 2 (HCC) No date: Dysplasia of prostate No date: Erectile dysfunction No date: Frequent falls 08/23/2022: Hepatic flexure mass     Comment:  a.) colonoscopy 08/23/2022: 14 x 15 mm partially  obstructing ulcerated distal ascending colon mass; tissue              friable --> pathology resulted as tubular adenoma,               however felt to be false (-) --> referral to sugery and               colectomy recommended. 08/28/2022: History of MRSA infection     Comment:  a.) MRSA PCR (+) 08/28/2022; culture from LEFT thigh               abscess No date: HTN (hypertension) No date: Hyperlipidemia No date: Hypogonadism in male No date: Hypothyroidism No date: IDA (iron deficiency anemia) No date: LBBB (left bundle branch block) No date: Long-term use of aspirin therapy No date: Lumbar spinal stenosis No date: Mild cardiomegaly No date: Nodule of right lung 06/16/2013: Orchitis and epididymitis No date: OSA on CPAP 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, hand No date: Pericardial effusion No date: Prostatitis 08/2022: Sepsis (HCC) 10/22/2015: Subdural hematoma (HCC)     Comment:  a.) s/p traumatic mechanical fall --> CT head               10/22/2015: high-density SDH the  left cerebral convexity               with13 mm midline shift --> s/p LEFT frontal burr hole               craniotomy No date: Tubular adenoma of colon No date: Ventral hernia  Past Surgical History: No date: BACK SURGERY 08/21/2022: BIOPSY     Comment:  Procedure: BIOPSY;  Surgeon: Jaynie Collins, DO;               Location: [facility] ENDOSCOPY;  Service: Gastroenterology;; 08/23/2022: BIOPSY     Comment:  Procedure: BIOPSY;  Surgeon: Jaynie Collins, DO;               Location: [facility] ENDOSCOPY;  Service: Gastroenterology;; 10/22/2015: Ines Bloomer HOLE OF CRANIUM; Left 08/23/2022: COLONOSCOPY WITH PROPOFOL; N/A     Comment:  Procedure: COLONOSCOPY WITH PROPOFOL;  Surgeon: Jaynie Collins, DO;  Location: [facility] ENDOSCOPY;  Service:               Gastroenterology;  Laterality: N/A; 08/21/2022: ESOPHAGOGASTRODUODENOSCOPY (EGD) WITH PROPOFOL; N/A     Comment:  Procedure: ESOPHAGOGASTRODUODENOSCOPY (EGD) WITH               PROPOFOL;  Surgeon: Jaynie Collins, DO;  Location:              [facility] ENDOSCOPY;  Service: Gastroenterology;  Laterality:               N/A; 08/23/2022: GIVENS CAPSULE STUDY; N/A     Comment:  Procedure: GIVENS CAPSULE STUDY;  Surgeon: Jaynie Collins, DO;  Location: [facility] ENDOSCOPY;  Service:               Gastroenterology;  Laterality: N/A; 08/28/2022: IRRIGATION AND DEBRIDEMENT ABSCESS; Left     Comment:  Procedure: IRRIGATION AND DEBRIDEMENT ABSCESS LEFT UPPER              THIGH/GROIN;  Surgeon: Leafy Ro, MD;  Location:               [facility] ORS;  Service: General;  Laterality: Left; 08/23/2022: POLYPECTOMY     Comment:  Procedure: POLYPECTOMY;  Surgeon: Jaynie Collins,              DO;  Location: [facility] ENDOSCOPY;  Service:               Gastroenterology;; 08/23/2022: SUBMUCOSAL TATTOO INJECTION     Comment:  Procedure: SUBMUCOSAL TATTOO INJECTION;  Surgeon: Jaynie Collins, DO;  Location:  [facility] ENDOSCOPY;  Service:               Gastroenterology;; No date: TONSILLECTOMY  BMI    Body Mass Index: 28.77 kg/m      Reproductive/Obstetrics negative OB ROS                             Anesthesia Physical Anesthesia Plan  ASA: 3  Anesthesia Plan: General   Post-op Pain Management:    Induction: Intravenous  PONV Risk Score and Plan: Treatment may vary due to age or medical condition, Propofol infusion and TIVA  Airway Management Planned: Natural Airway and Nasal Cannula  Additional Equipment:   Intra-op Plan:   Post-operative Plan:   Informed Consent: I have reviewed the patients History and Physical, chart, labs and discussed the procedure including the risks, benefits and alternatives for the proposed anesthesia with the patient or authorized representative who has indicated his/her understanding and acceptance.     Dental Advisory Given  Plan Discussed with: Anesthesiologist, CRNA and Surgeon  Anesthesia Plan Comments: (Patient consented for risks of anesthesia including but not limited to:  - adverse reactions to medications - damage to eyes, teeth, lips or other oral mucosa - nerve damage due to positioning  - sore throat or hoarseness - Damage to heart, brain, nerves, lungs, other parts of body or loss of life  Patient voiced understanding.)       Anesthesia Quick Evaluation

## 2023-02-28 NOTE — Op Note (Signed)
[facility] Gastroenterology Patient Name: Paul Briggs Procedure Date: 02/28/2023 8:12 AM MRN: [id number] Account #: [id number] Date of Birth: 12/22/1945 Admit Type: Outpatient Age: 78 Room: [facility] ENDO ROOM 2 Gender: Male Note Status: Finalized Instrument Name: Colonscope [id number] Procedure:             Colonoscopy Indications:           Iron deficiency anemia Providers:             Trenda Moots, DO Referring MD:          Daryl Eastern. Suzie Portela (Referring MD) Medicines:             Monitored Anesthesia Care Complications:         No immediate complications. Estimated blood loss:                         Minimal. Procedure:             Pre-Anesthesia Assessment:                        - Prior to the procedure, a History and Physical was                         performed, and patient medications and allergies were                         reviewed. The patient is competent. The risks and                         benefits of the procedure and the sedation options and                         risks were discussed with the patient. All questions                         were answered and informed consent was obtained.                         Patient identification and proposed procedure were                         verified by the physician, the nurse, the anesthetist                         and the technician in the endoscopy suite. Mental                         Status Examination: alert and oriented. Airway                         Examination: normal oropharyngeal airway and neck                         mobility. Respiratory Examination: clear to                         auscultation. CV Examination: regular rate and rhythm.  Prophylactic Antibiotics: The patient does not require                         prophylactic antibiotics. Prior Anticoagulants: The                         patient has taken no anticoagulant or antiplatelet                          agents. ASA Grade Assessment: III - A patient with                         severe systemic disease. After reviewing the risks and                         benefits, the patient was deemed in satisfactory                         condition to undergo the procedure. The anesthesia                         plan was to use monitored anesthesia care (MAC).                         Immediately prior to administration of medications,                         the patient was re-assessed for adequacy to receive                         sedatives. The heart rate, respiratory rate, oxygen                         saturations, blood pressure, adequacy of pulmonary                         ventilation, and response to care were monitored                         throughout the procedure. The physical status of the                         patient was re-assessed after the procedure.                        After obtaining informed consent, the colonoscope was                         passed under direct vision. Throughout the procedure,                         the patient's blood pressure, pulse, and oxygen                         saturations were monitored continuously. The                         Colonoscope was introduced through the anus with the  intention of advancing to the hepatic flexure. The                         scope was advanced to the transverse colon before the                         procedure was aborted. Medications were given. The                         colonoscopy was performed with difficulty due to poor                         bowel prep with stool present. The patient tolerated                         the procedure well. The quality of the bowel                         preparation was poor, inadequate and unsatisfactory.                         The rectum was photographed. Findings:      The perianal and digital rectal examinations were normal. Pertinent        negatives include normal sphincter tone.      Extensive amounts of solid stool was found in the recto-sigmoid colon,       in the sigmoid colon, in the descending colon, at the splenic flexure       and in the transverse colon, precluding visualization. Due to solid       stool present completely obstructing visualization past splenic flexure,       procedure was aborted      Two sessile polyps were found in the rectum. The polyps were 3 to 4 mm       in size. These polyps were removed with a cold snare. Resection and       retrieval were complete. Estimated blood loss was minimal. Impression:            - Preparation of the colon was poor.                        - Preparation of the colon was unsatisfactory.                        - Preparation of the colon was inadequate.                        - Stool in the recto-sigmoid colon, in the sigmoid                         colon, in the descending colon, at the splenic flexure                         and in the transverse colon.                        - Two 3 to 4 mm polyps in the rectum, removed with a  cold snare. Resected and retrieved. Recommendation:        - Patient has a contact number available for                         emergencies. The signs and symptoms of potential                         delayed complications were discussed with the patient.                         Return to normal activities tomorrow. Written                         discharge instructions were provided to the patient.                        - Discharge patient to home.                        - Resume previous diet.                        - Continue present medications.                        - No ibuprofen, naproxen, or other non-steroidal                         anti-inflammatory drugs.                        - Await pathology results.                        - Repeat colonoscopy at appointment to be scheduled                          because the bowel preparation was poor.                        - Return to GI office as previously scheduled.                        - The findings and recommendations were discussed with                         the patient.                        - The findings and recommendations were discussed with                         the patient's family. Procedure Code(s):     --- Professional ---                        [phone], 52, Colonoscopy, flexible; with removal of                         tumor(s), polyp(s), or other lesion(s) by snare  technique Diagnosis Code(s):     --- Professional ---                        D12.8, Benign neoplasm of rectum                        D50.9, Iron deficiency anemia, unspecified CPT copyright 2022 [employer]. All rights reserved. The codes documented in this report are preliminary and upon coder review may  be revised to meet current compliance requirements. Attending Participation:      I personally performed the entire procedure. Elfredia Nevins, DO Jaynie Collins DO, DO 02/28/2023 8:51:47 AM This report has been signed electronically. Number of Addenda: 0 Note Initiated On: 02/28/2023 8:12 AM Total Procedure Duration: 0 hours 5 minutes 39 seconds  Estimated Blood Loss:  Estimated blood loss was minimal.      [facility]

## 2023-02-28 NOTE — Interval H&P Note (Signed)
History and Physical Interval Note: Preprocedure H&P from 02/28/23  was reviewed and there was no interval change after seeing and examining the patient.  Written consent was obtained from the patient after discussion of risks, benefits, and alternatives. Patient has consented to proceed with Esophagogastroduodenoscopy and Colonoscopy with possible intervention   02/28/2023 8:15 AM  Paul Ana [name]  has presented today for surgery, with the diagnosis of Z86.0101 (ICD-10-CM) - Personal history of adenomatous and serrated colon polyps D50.9 (ICD-10-CM) - Iron deficiency anemia, unspecified iron deficiency anemia type.  The various methods of treatment have been discussed with the patient and family. After consideration of risks, benefits and other options for treatment, the patient has consented to  Procedure(s) with comments: COLONOSCOPY WITH PROPOFOL (N/A) - DM ESOPHAGOGASTRODUODENOSCOPY (EGD) WITH PROPOFOL (N/A) as a surgical intervention.  The patient's history has been reviewed, patient examined, no change in status, stable for surgery.  I have reviewed the patient's chart and labs.  Questions were answered to the patient's satisfaction.     Jaynie Collins

## 2023-03-01 ENCOUNTER — Ambulatory Visit: Payer: No Typology Code available for payment source | Admitting: Medical

## 2023-03-01 LAB — SURGICAL PATHOLOGY

## 2023-03-01 NOTE — Anesthesia Postprocedure Evaluation (Signed)
 Anesthesia Post Note  Patient: Bradon Fester [name]  Procedure(s) Performed: COLONOSCOPY WITH PROPOFOL  ESOPHAGOGASTRODUODENOSCOPY (EGD) WITH PROPOFOL  BIOPSY  Patient location during evaluation: Endoscopy Anesthesia Type: General Level of consciousness: awake and alert Pain management: pain level controlled Vital Signs Assessment: post-procedure vital signs reviewed and stable Respiratory status: spontaneous breathing, nonlabored ventilation, respiratory function stable and patient connected to nasal cannula oxygen  Cardiovascular status: blood pressure returned to baseline and stable Postop Assessment: no apparent nausea or vomiting Anesthetic complications: no   No notable events documented.   Last Vitals:  Vitals:   02/28/23 0859 02/28/23 0909  BP:  (!) 161/90  Pulse:    Resp: 18 18  Temp:    SpO2:      Last Pain:  Vitals:   02/28/23 0909  TempSrc:   PainSc: 0-No pain                 Prentice Murphy

## 2023-03-01 NOTE — Progress Notes (Deleted)
 Cardiology Office Note:    Date:  03/01/2023   ID:  Paul Nadara Browner [name], DOB Nov 17, 1945, MRN [id number]  PCP:  Cyrus Selinda Moose, PA-C  [facility] Cardiologist:  Redell Cave, MD  [facility] Electrophysiologist:  None   Referring MD: Cyrus Selinda Moose,*   Chief Complaint: ***  History of Present Illness:    Paul Caspers [name] is a 78 y.o. male with a hx of HFmrEF, NICM, LVH, HTN, nonobstructive CAD, diabetes, CLL s/p chemo, CKD stage 3, IDA who presents for follow-up.    The patient was initially seen 05/2022 for HTN and he was started on Diovan  160mg  daily. EKG showed LBBB and an echo was ordered. Echo showed LVEF 45-50%, no WMA, moderate to severe LVH with speckled appearance, consider for amyloidosis, severely dilated left atrium, mildly dilated right atrium, small pericardial effusion with no tamponade, mild MR. Plan was for cardiac CTA and cardiac MRI.  Cardiac CTA showed coronary calcium  score of 14.3, 12th percentile for age and sex matched control, minimal proximal LAD stenosis, overall minimal nonobstructive CAD.   Patient was admitted August 26, 2022 with a mechanical fall fall, cellulitis of the left groin/SIRS, and mildly elevated troponin. He was briefly seen by cardiology and felt to be stable. No inpatient cardiac work-up was pursued.    The patient was last seen October 2020 for was overall stable from a cardiac perspective.  He had not yet undergone cardiac MRI.  Hydralazine  was increased for blood pressure control.  Today,  Past Medical History:  Diagnosis Date   Adenoma of left adrenal gland    Anemia    Aortic atherosclerosis (HCC)    Blood transfusion without reported diagnosis    BPH (benign prostatic hyperplasia)    Bradycardia    CAD (coronary artery disease) 08/26/2022   a.) cCTA 08/26/2022: Ca2+ 14.3 (12th %'ile for age/sex.race match control); (<25%) pLAD   Cellulitis of left thigh 08/2022   CHF (congestive heart failure)  (HCC)    a.) TTE 10/23/2015: EF 60-65%, mod-sev LVH, mild biatrial dil, degen MV disease, AoV sclerosis, asc Ao 38 mm, G1DD; b.) TTE 08/05/2022: EF 45-50%, mod-sev LVH (speckled pattern), sev biatrial dil, mild MR, AoV sclerosis, G1DD   Chronic pain syndrome    a.) on COT managed by pain management   Chronic, continuous use of opioids    a.) chronic pain syndrome/chronic back pain; managed by pain management   CKD (chronic kidney disease) stage 3, GFR 30-59 ml/min (HCC)    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   a.) Rai stage III   DDD (degenerative disc disease), cervical    Difficult airway 10/22/2015   a.) 1st attempt with glidescope  --> macroglossic (unsuccessful); b.) 2nd attempt with Mac 4 and direct laryngoscopy with a 8.0 ETT --> tube too large to pass through the cords; b.) 3rd attempt (successful) with a Mac 4 and a 7.5 tube   Diverticulosis    DM (diabetes mellitus), type 2 (HCC)    Dysplasia of prostate    Erectile dysfunction    Frequent falls    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   History of MRSA infection 08/28/2022   a.) MRSA PCR (+) 08/28/2022; culture from LEFT thigh abscess   HTN (hypertension)    Hyperlipidemia  Hypogonadism in male    Hypothyroidism    IDA (iron  deficiency anemia)    LBBB (left bundle branch block)    Long-term use of aspirin  therapy    Lumbar spinal stenosis    Mild cardiomegaly    Myocardial infarction (HCC)    Nodule of right lung    Orchitis and epididymitis 06/16/2013   OSA on CPAP    Palindromic rheumatism, hand    Pericardial effusion    Prostatitis    Sepsis (HCC) 08/2022   Subdural hematoma (HCC) 10/22/2015   a.) s/p traumatic mechanical fall --> CT head 10/22/2015: high-density SDH  the left cerebral convexity with13 mm midline shift --> s/p LEFT frontal burr hole craniotomy   Tubular adenoma of colon    Ventral hernia     Past Surgical History:  Procedure Laterality Date   BACK SURGERY     BIOPSY  08/21/2022   Procedure: BIOPSY;  Surgeon: Onita Elspeth Sharper, DO;  Location: [facility] ENDOSCOPY;  Service: Gastroenterology;;   BIOPSY  08/23/2022   Procedure: BIOPSY;  Surgeon: Onita Elspeth Sharper, DO;  Location: [facility] ENDOSCOPY;  Service: Gastroenterology;;   SOLMON DAKIN OF CRANIUM Left 10/22/2015   COLON SURGERY  09/28/2022   COLONOSCOPY WITH PROPOFOL  N/A 08/23/2022   Procedure: COLONOSCOPY WITH PROPOFOL ;  Surgeon: Onita Elspeth Sharper, DO;  Location: [facility] ENDOSCOPY;  Service: Gastroenterology;  Laterality: N/A;   ESOPHAGOGASTRODUODENOSCOPY (EGD) WITH PROPOFOL  N/A 08/21/2022   Procedure: ESOPHAGOGASTRODUODENOSCOPY (EGD) WITH PROPOFOL ;  Surgeon: Onita Elspeth Sharper, DO;  Location: [facility] ENDOSCOPY;  Service: Gastroenterology;  Laterality: N/A;   GIVENS CAPSULE STUDY N/A 08/23/2022   Procedure: GIVENS CAPSULE STUDY;  Surgeon: Onita Elspeth Sharper, DO;  Location: [facility] ENDOSCOPY;  Service: Gastroenterology;  Laterality: N/A;   IRRIGATION AND DEBRIDEMENT ABSCESS Left 08/28/2022   Procedure: IRRIGATION AND DEBRIDEMENT ABSCESS LEFT UPPER THIGH/GROIN;  Surgeon: Jordis Laneta FALCON, MD;  Location: [facility] ORS;  Service: General;  Laterality: Left;   LAPAROSCOPIC RIGHT COLECTOMY Right 09/28/2022   Procedure: LAPAROSCOPIC RIGHT COLECTOMY, RNFA to assist;  Surgeon: Jordis Laneta FALCON, MD;  Location: [facility] ORS;  Service: General;  Laterality: Right;   POLYPECTOMY  08/23/2022   Procedure: POLYPECTOMY;  Surgeon: Onita Elspeth Sharper, DO;  Location: [facility] ENDOSCOPY;  Service: Gastroenterology;;   SUBMUCOSAL TATTOO INJECTION  08/23/2022   Procedure: SUBMUCOSAL TATTOO INJECTION;  Surgeon: Onita Elspeth Sharper, DO;  Location[facility] ENDOSCOPY;  Service: Gastroenterology;;   TONSILLECTOMY     VENTRAL  HERNIA REPAIR N/A 09/28/2022   Procedure: HERNIA REPAIR VENTRAL ADULT;  Surgeon: Jordis Laneta FALCON, MD;  Location: [facility] ORS;  Service: General;  Laterality: N/A;    Current Medications: No outpatient medications have been marked as taking for the 03/01/23 encounter (Appointment) with [place], Alsie Younes H, PA-C.     Allergies:   Patient has no known allergies.   Social History   Socioeconomic History   Marital status: Married    Spouse name: Not on file   Number of children: Not on file   Years of education: Not on file   Highest education level: 5th grade  Occupational History   Not on file  Tobacco Use   Smoking status: Former    Types: Cigarettes    Passive exposure: Past   Smokeless tobacco: Never   Tobacco comments:    Stop smoking iver 40  plus years ago  Vaping Use   Vaping status: Never Used  Substance and Sexual Activity   Alcohol use: No   Drug use: No   Sexual  activity: Not Currently  Other Topics Concern   Not on file  Social History Narrative   Not on file   Social Drivers of Health   Financial Resource Strain: Medium Risk (02/24/2023)   Received from [facility]   Overall Financial Resource Strain (CARDIA)    Difficulty of Paying Living Expenses: Somewhat hard  Food Insecurity: Food Insecurity Present (02/24/2023)   Received [facility]   Hunger Vital Sign    Worried About Running Out of Food in the Last Year: Sometimes true    Ran Out of Food in the Last Year: Sometimes true  Transportation Needs: No Transportation Needs (02/24/2023)   Received from [facility] - Transportation    In the past 12 months, has lack of transportation kept you from medical appointments or from getting medications?: No    Lack of Transportation (Non-Medical): No  Physical Activity: Unknown (07/26/2022)   Exercise Vital Sign    Days of Exercise per Week: Patient declined    Minutes of Exercise per Session: Not on  file  Stress: Stress Concern Present (07/26/2022)   [employer] of Occupational Health - Occupational Stress Questionnaire    Feeling of Stress : To some extent  Social Connections: Socially Integrated (12/20/2022)   Social Connection and Isolation Panel [NHANES]    Frequency of Communication with Friends and Family: More than three times a week    Frequency of Social Gatherings with Friends and Family: Patient declined    Attends Religious Services: More than 4 times per year    Active Member of [employer] or Organizations: Yes    Attends Banker Meetings: Never    Marital Status: Married     Family History: The patient's ***family history includes ADD / ADHD in his daughter; Asthma in his son; Cancer in his maternal aunt. There is no history of Prostate cancer, Kidney disease, Kidney cancer, or Bladder Cancer.  ROS:   Please see the history of present illness.    *** All other systems reviewed and are negative.  EKGs/Labs/Other Studies Reviewed:    The following studies were reviewed today: ***  EKG:  EKG is *** ordered today.  The ekg ordered today demonstrates ***  Recent Labs: 10/06/2022: Magnesium 2.1 12/22/2022: TSH 27.300 02/18/2023: ALT 31; BUN 18; Creatinine 0.89; Hemoglobin 8.6; Platelet Count 182; Potassium 4.2; Sodium 137  Recent Lipid Panel    Component Value Date/Time   CHOL 134 12/22/2022 1422   TRIG 74 12/22/2022 1422   HDL 72 12/22/2022 1422   CHOLHDL 1.9 12/22/2022 1422   CHOLHDL 3.0 07/06/2021 1030   LDLCALC 47 12/22/2022 1422   LDLCALC 105 (H) 07/06/2021 1030   LDLDIRECT 84 10/29/2022 0950     Risk Assessment/Calculations:   {Does this patient have ATRIAL FIBRILLATION?:[phone]}   Physical Exam:    VS:  There were no vitals taken for this visit.    Wt Readings from Last 3 Encounters:  02/28/23 217 lb (98.4 kg)  02/18/23 223 lb 4.8 oz (101.3 kg)  02/13/23 222 lb (100.7 kg)     GEN: *** Well nourished, well developed in no  acute distress HEENT: Normal NECK: No JVD; No carotid bruits LYMPHATICS: No lymphadenopathy CARDIAC: ***RRR, no murmurs, rubs, gallops RESPIRATORY:  Clear to auscultation without rales, wheezing or rhonchi  ABDOMEN: Soft, non-tender, non-distended MUSCULOSKELETAL:  No edema; No deformity  SKIN: Warm and dry NEUROLOGIC:  Alert and oriented x 3 PSYCHIATRIC:  Normal  affect   ASSESSMENT:    No diagnosis found. PLAN:    In order of problems listed above:  ***  Disposition: Follow up {follow up:15908} with ***   Shared Decision Making/Informed Consent   {Are you ordering a CV Procedure (e.g. stress test, cath, DCCV, TEE, etc)?   Press F2        :[id number]}    Signed, Tsuruko Murtha VEAR Fishman, PA-C  03/01/2023 7:30 AM    [facility]

## 2023-03-02 ENCOUNTER — Encounter: Payer: Self-pay | Admitting: Gastroenterology

## 2023-03-02 ENCOUNTER — Inpatient Hospital Stay: Payer: No Typology Code available for payment source | Attending: Oncology

## 2023-03-02 ENCOUNTER — Ambulatory Visit (INDEPENDENT_AMBULATORY_CARE_PROVIDER_SITE_OTHER): Payer: No Typology Code available for payment source | Admitting: Surgery

## 2023-03-02 VITALS — BP 193/79 | HR 73 | Temp 98.1°F | Ht 73.0 in | Wt 225.0 lb

## 2023-03-02 VITALS — BP 170/81 | HR 62 | Temp 97.5°F | Resp 18

## 2023-03-02 DIAGNOSIS — D509 Iron deficiency anemia, unspecified: Secondary | ICD-10-CM | POA: Insufficient documentation

## 2023-03-02 DIAGNOSIS — K4091 Unilateral inguinal hernia, without obstruction or gangrene, recurrent: Secondary | ICD-10-CM

## 2023-03-02 DIAGNOSIS — C9110 Chronic lymphocytic leukemia of B-cell type not having achieved remission: Secondary | ICD-10-CM | POA: Insufficient documentation

## 2023-03-02 DIAGNOSIS — K4090 Unilateral inguinal hernia, without obstruction or gangrene, not specified as recurrent: Secondary | ICD-10-CM

## 2023-03-02 MED ORDER — SODIUM CHLORIDE 0.9% FLUSH
10.0000 mL | Freq: Once | INTRAVENOUS | Status: AC | PRN
  Administered 2023-03-02: 10 mL
  Filled 2023-03-02: qty 10

## 2023-03-02 MED ORDER — IRON SUCROSE 20 MG/ML IV SOLN
200.0000 mg | Freq: Once | INTRAVENOUS | Status: AC
  Administered 2023-03-02: 200 mg via INTRAVENOUS
  Filled 2023-03-02: qty 10

## 2023-03-02 NOTE — Patient Instructions (Addendum)
 You have chose to have your hernia repaired. This will be done by Dr. Jordis at [facility].  You will need to hold your Aspirin  for 1 week prior to surgery.   Please see your (blue) Pre-care information that you have been given today. Our surgery scheduler will call you to verify surgery date and to go over information.   You will need to arrange to be out of work for approximately 1-2 weeks and then you may return with a lifting restriction for 4 more weeks. If you have FMLA or Disability paperwork that needs to be filled out, please have your company fax your paperwork to [phone] or you may drop this by either office. This paperwork will be filled out within 3 days after your surgery has been completed.  You may have a bruise in your groin and also swelling and brusing in your testicle area. You may use ice 4-5 times daily for 15-20 minutes each time. Make sure that you place a barrier between you and the ice pack. To decrease the swelling, you may roll up a bath towel and place it vertically in between your thighs with your testicles resting on the towel. You will want to keep this area elevated as much as possible for several days following surgery.    Inguinal Hernia, Adult Muscles help keep everything in the body in its proper place. But if a weak spot in the muscles develops, something can poke through. That is called a hernia. When this happens in the lower part of the belly (abdomen), it is called an inguinal hernia. (It takes its name from a part of the body in this region called the inguinal canal.) A weak spot in the wall of muscles lets some fat or part of the small intestine bulge through. An inguinal hernia can develop at any age. Men get them more often than women. CAUSES  In adults, an inguinal hernia develops over time. It can be triggered by: Suddenly straining the muscles of the lower abdomen. Lifting heavy objects. Straining to have a bowel movement. Difficult bowel  movements (constipation) can lead to this. Constant coughing. This may be caused by smoking or lung disease. Being overweight. Being pregnant. Working at a job that requires long periods of standing or heavy lifting. Having had an inguinal hernia before. One type can be an emergency situation. It is called a strangulated inguinal hernia. It develops if part of the small intestine slips through the weak spot and cannot get back into the abdomen. The blood supply can be cut off. If that happens, part of the intestine may die. This situation requires emergency surgery. SYMPTOMS  Often, a small inguinal hernia has no symptoms. It is found when a healthcare provider does a physical exam. Larger hernias usually have symptoms.  In adults, symptoms may include: A lump in the groin. This is easier to see when the person is standing. It might disappear when lying down. In men, a lump in the scrotum. Pain or burning in the groin. This occurs especially when lifting, straining or coughing. A dull ache or feeling of pressure in the groin. Signs of a strangulated hernia can include: A bulge in the groin that becomes very painful and tender to the touch. A bulge that turns red or purple. Fever, nausea and vomiting. Inability to have a bowel movement or to pass gas. DIAGNOSIS  To decide if you have an inguinal hernia, a healthcare provider will probably do a physical examination.  This will include asking questions about any symptoms you have noticed. The healthcare provider might feel the groin area and ask you to cough. If an inguinal hernia is felt, the healthcare provider may try to slide it back into the abdomen. Usually no other tests are needed. TREATMENT  Treatments can vary. The size of the hernia makes a difference. Options include: Watchful waiting. This is often suggested if the hernia is small and you have had no symptoms. No medical procedure will be done unless symptoms develop. You will  need to watch closely for symptoms. If any occur, contact your healthcare provider right away. Surgery. This is used if the hernia is larger or you have symptoms. Open surgery. This is usually an outpatient procedure (you will not stay overnight in a hospital). An cut (incision) is made through the skin in the groin. The hernia is put back inside the abdomen. The weak area in the muscles is then repaired by herniorrhaphy or hernioplasty. Herniorrhaphy: in this type of surgery, the weak muscles are sewn back together. Hernioplasty: a patch or mesh is used to close the weak area in the abdominal wall. Laparoscopy. In this procedure, a surgeon makes small incisions. A thin tube with a tiny video camera (called a laparoscope) is put into the abdomen. The surgeon repairs the hernia with mesh by looking with the video camera and using two long instruments. HOME CARE INSTRUCTIONS  After surgery to repair an inguinal hernia: You will need to take pain medicine prescribed by your healthcare provider. Follow all directions carefully. You will need to take care of the wound from the incision. Your activity will be restricted for awhile. This will probably include no heavy lifting for several weeks. You also should not do anything too active for a few weeks. When you can return to work will depend on the type of job that you have. During watchful waiting periods, you should: Maintain a healthy weight. Eat a diet high in fiber (fruits, vegetables and whole grains). Drink plenty of fluids to avoid constipation. This means drinking enough water and other liquids to keep your urine clear or pale yellow. Do not lift heavy objects. Do not stand for long periods of time. Quit smoking. This should keep you from developing a frequent cough. SEEK MEDICAL CARE IF:  A bulge develops in your groin area. You feel pain, a burning sensation or pressure in the groin. This might be worse if you are lifting or straining. You  develop a fever of more than 100.5 F (38.1 C). SEEK IMMEDIATE MEDICAL CARE IF:  Pain in the groin increases suddenly. A bulge in the groin gets bigger suddenly and does not go down. For men, there is sudden pain in the scrotum. Or, the size of the scrotum increases. A bulge in the groin area becomes red or purple and is painful to touch. You have nausea or vomiting that does not go away. You feel your heart beating much faster than normal. You cannot have a bowel movement or pass gas. You develop a fever of more than 102.0 F (38.9 C).   This information is not intended to replace advice given to you by your health care provider. Make sure you discuss any questions you have with your health care provider.   Document Released: 05/30/2008 Document Revised: 04/05/2011 Document Reviewed: 07/15/2014 Elsevier Interactive Patient Education [employer]

## 2023-03-03 ENCOUNTER — Telehealth: Payer: Self-pay | Admitting: Surgery

## 2023-03-03 NOTE — Telephone Encounter (Signed)
 Patient has been advised of Pre-Admission date/time, and Surgery date at [facility].  Surgery Date: 03/08/23 Preadmission Testing Date: 03/04/23 (phone 1p-4p)  Patient has been made aware to call [phone], between 1-3:00pm the day before surgery, to find out what time to arrive for surgery.

## 2023-03-03 NOTE — Progress Notes (Signed)
 Cardiology Office Note:  .   Date:  03/04/2023  ID:  Paul Nadara Browner [name], DOB [birth year], MRN [id number] PCP: Cyrus Selinda Moose, PA-C  [facility] Providers Cardiologist:  Paul Cave, MD     History of Present Illness: .   Paul Nadara Browner [name] is a 78 y.o. male  with h/o HFmrEF, NICM, LVH, HTN, nonobstructive CAD, diabetes, CLL s/p chemo, CKD stage 3, IDA who presents for pre-op evaluation.    The patient was initially seen 05/2022 for HTN and he was started on Diovan  160mg  daily. EKG showed LBBB and an echo was ordered. Echo showed LVEF 45-50%, no WMA, moderate to severe LVH with speckled appearance, consider for amyloidosis, severely dilated left atrium, mildly dilated right atrium, small pericardial effusion with no tamponade, mild MR. Plan was for cardiac CTA and cardiac MRI.  Cardiac CTA showed coronary calcium  score of 14.3, 12th percentile for age and sex matched control, minimal proximal LAD stenosis, overall minimal nonobstructive CAD.   Patient was admitted August 26, 2022 with a mechanical fall fall, cellulitis of the left groin/SIRS, and mildly elevated troponin. He was briefly seen by cardiology and felt to be stable. No inpatient cardiac work-up was pursued.    The patient was last seen 09/17/22 for pre-op for laparoscopic colectomy 09/28/22. He felt weak since being discharged from the hospital, but was overall stable from a cardiac perspective. He was encouraged to get the cardiac MRI done.   Today, the patient reports he lost all his medications last week and needs refills.  He has overall been OK from a cardiac perspective. He needs a hernia repair. He denies chest pain. A little SOB last week that is better. No lower leg edema, palpitations, pnd, orthopnea. He can walk 1-2 blocks, he can walk up a flight of stairs.   Studies Reviewed: .    Cardiac CTA 08/2022 IMPRESSION: 1. Coronary calcium  score of 14.3. This was 12th percentile for age and sex  matched control.   2. Normal coronary origin with right dominance.   3. Minimal proximal LAD stenosis (<25%).   4. CAD-RADS 1. Minimal non-obstructive CAD (0-24%). Consider preventive therapy and risk factor modification.   Electronically Signed: By: Paul Briggs M.D. On: 08/26/2022 13:58     Echo 07/2022 1. Left ventricular ejection fraction, by estimation, is 45 to 50%. Left  ventricular ejection fraction by 3D volume is 47 %. The left ventricle has  mildly decreased function. The left ventricle has no regional wall motion  abnormalities. There is  moderate to severe concentric left ventricular hypertrophy, speckled  appearance. Left ventricular diastolic parameters are consistent with  Grade I diastolic dysfunction (impaired relaxation). The average left  ventricular global longitudinal strain is  -18.8 %. Consider w/u for amlyoid if clinically indicated.   2. Right ventricular systolic function is normal. The right ventricular  size is normal. Tricuspid regurgitation signal is inadequate for assessing  PA pressure.   3. Left atrial size was severely dilated.   4. Right atrial size was mildly dilated.   5. A small pericardial effusion is present. There is no evidence of  cardiac tamponade.   6. The mitral valve is normal in structure. Mild mitral valve  regurgitation. No evidence of mitral stenosis.   7. The aortic valve is tricuspid. Aortic valve regurgitation is not  visualized. Aortic valve sclerosis/calcification is present, without any  evidence of aortic stenosis.   8. The inferior vena cava is normal in size with greater  than 50%  respiratory variability, suggesting right atrial pressure of 3 mmHg.        Physical Exam:   VS:  BP (!) 150/73 (BP Location: Left Arm, Patient Position: Sitting, Cuff Size: Large)   Pulse (!) 57   Ht 6' 1 (1.854 m)   Wt 224 lb 8 oz (101.8 kg)   SpO2 96%   BMI 29.62 kg/m    Wt Readings from Last 3 Encounters:  03/04/23 224  lb 8 oz (101.8 kg)  03/02/23 225 lb (102.1 kg)  02/28/23 217 lb (98.4 kg)    GEN: Well nourished, well developed in no acute distress NECK: No JVD; No carotid bruits CARDIAC: RRR, no murmurs, rubs, gallops RESPIRATORY:  Clear to auscultation without rales, wheezing or rhonchi  ABDOMEN: Soft, non-tender, non-distended EXTREMITIES:  No edema; No deformity   ASSESSMENT AND PLAN: .    HFmrEF NICM Mod to severe LVH Echo 07/2022 showed LVEF 45-50% with speckled appearance (consider amyloid), moderate to sever LVH, G1DD. Cardiac CTA showed minimal nonobstructive CAD. cMRI is scheduled for March 5th. Patient has trace lower leg edema that is unchanged. Refill Toprol , Entresto , Farxiga , Hydralazine .   HTN BP is high today, but he has been without medications for a week. Refill Hydralazine , Toprol  and Entresto .   Nonosbtructive CAD No chest pain reported. Refill meds as above.   Pre-operative cardiac evaluation for hernia repair Overall patient is stable from a cardiac perspective. We will refill all cardiac medications today.  He has trace lower leg edema, which is stable. EKG shows TWI inf leads, but he denies anginal symptoms. Patient needs cMRI, but this should not change overall risk. cMRI scheduled for March 5th. METS>4. According to [facility] he is low risk, 6 % risk of MACE. Can hold ASA per surgeon instructions. No further cardiac work-up prior to surgery.   Dispo: Follow-up in 3 months  Signed, Ahria Slappey VEAR Fishman, PA-C

## 2023-03-04 ENCOUNTER — Encounter
Admission: RE | Admit: 2023-03-04 | Discharge: 2023-03-04 | Disposition: A | Discharge status: Home / Self Care | Payer: No Typology Code available for payment source | Source: Ambulatory Visit | Attending: Surgery | Admitting: Surgery

## 2023-03-04 ENCOUNTER — Ambulatory Visit: Payer: No Typology Code available for payment source | Attending: Medical | Admitting: Medical

## 2023-03-04 ENCOUNTER — Encounter: Payer: Self-pay | Admitting: Medical

## 2023-03-04 ENCOUNTER — Other Ambulatory Visit: Payer: Self-pay

## 2023-03-04 ENCOUNTER — Telehealth: Payer: Self-pay | Admitting: Surgery

## 2023-03-04 VITALS — BP 150/73 | HR 57 | Ht 73.0 in | Wt 224.5 lb

## 2023-03-04 VITALS — Ht 73.0 in | Wt 225.0 lb

## 2023-03-04 DIAGNOSIS — I10 Essential (primary) hypertension: Secondary | ICD-10-CM

## 2023-03-04 DIAGNOSIS — I517 Cardiomegaly: Secondary | ICD-10-CM

## 2023-03-04 DIAGNOSIS — Z01810 Encounter for preprocedural cardiovascular examination: Secondary | ICD-10-CM

## 2023-03-04 DIAGNOSIS — I5022 Chronic systolic (congestive) heart failure: Secondary | ICD-10-CM | POA: Diagnosis not present

## 2023-03-04 DIAGNOSIS — E119 Type 2 diabetes mellitus without complications: Secondary | ICD-10-CM

## 2023-03-04 DIAGNOSIS — I428 Other cardiomyopathies: Secondary | ICD-10-CM

## 2023-03-04 DIAGNOSIS — I2510 Atherosclerotic heart disease of native coronary artery without angina pectoris: Secondary | ICD-10-CM

## 2023-03-04 MED ORDER — METOPROLOL SUCCINATE ER 25 MG PO TB24: 25.0000 mg | ORAL_TABLET | Freq: Every day | ORAL | 1 refills | Status: DC

## 2023-03-04 MED ORDER — SACUBITRIL-VALSARTAN 24-26 MG PO TABS: 1.0000 | ORAL_TABLET | Freq: Two times a day (BID) | ORAL | 3 refills | Status: DC

## 2023-03-04 MED ORDER — ROSUVASTATIN CALCIUM 40 MG PO TABS: 40.0000 mg | ORAL_TABLET | Freq: Every day | ORAL | 1 refills | Status: DC

## 2023-03-04 MED ORDER — HYDRALAZINE HCL 50 MG PO TABS: 100.0000 mg | ORAL_TABLET | Freq: Two times a day (BID) | ORAL | 3 refills | Status: DC

## 2023-03-04 NOTE — Telephone Encounter (Signed)
 Case rescheduled per Dr. Jordis.  Patient called and informed of surgery date change.    Patient has been advised of Pre-Admission date/time, and Surgery date at [facility].  Surgery Date: 03/09/23 Preadmission Testing Date: 03/04/23 (phone already done  Patient has been made aware to call [phone], between 1-3:00pm the day before surgery, to find out what time to arrive for surgery.

## 2023-03-04 NOTE — Patient Instructions (Addendum)
 Medication Instructions:  - Your physician recommends that you continue on your current medications as directed. Please refer to the Current Medication list given to you today.  All of your Cardiac Medications have been refilled at the pharmacy   *If you need a refill on your cardiac medications before your next appointment, please call your pharmacy*   Lab Work: - none ordered  If you have labs (blood work) drawn today and your tests are completely normal, you will receive your results only by: MyChart Message (if you have MyChart) OR A paper copy in the mail If you have any lab test that is abnormal or we need to change your treatment, we will call you to review the results.   Testing/Procedures:  1) Cardiac MRI as scheduled on March 5th   You are scheduled for Cardiac MRI at the location below.  Please arrive for your appointment at 9:45 am?    [facility] Entrance [address] [place], [place] [address]  Magnetic resonance imaging (MRI) is a painless test that produces images of the inside of the body without using Xrays.  During an MRI, strong magnets and radio waves work together in a data processing manager to form detailed images.   MRI images may provide more details about a medical condition than X-rays, CT scans, and ultrasounds can provide.  You may be given earphones to listen for instructions.  You may eat a light breakfast and take medications as ordered with the exception of furosemide , hydrochlorothiazide , chlorthalidone or spironolactone  (or any other fluid pill). If you are undergoing a stress MRI, please avoid stimulants for 12 hr prior to test. (I.e. Caffeine, nicotine, chocolate, or antihistamine medications)  An IV will be inserted into one of your veins. Contrast material will be injected into your IV. It will leave your body through your urine within a day. You may be told to drink plenty of fluids to help flush  the contrast material out of your system.  You will be asked to remove all metal, including: Watch, jewelry, and other metal objects including hearing aids, hair pieces and dentures. Also wearable glucose monitoring systems (ie. Freestyle Libre and Omnipods) (Braces and fillings normally are not a problem.)   TEST WILL TAKE APPROXIMATELY 1 HOUR  PLEASE NOTIFY SCHEDULING AT LEAST 24 HOURS IN ADVANCE IF YOU ARE UNABLE TO KEEP YOUR APPOINTMENT. [phone]  For more information and frequently asked questions, please visit our website : [email or web address]  Please call the Cardiac Imaging Nurse Navigators with any questions/concerns. [phone] Office     Follow-Up: At [facility], you and your health needs are our priority.  As part of our continuing mission to provide you with exceptional heart care, we have created designated Provider Care Teams.  These Care Teams include your primary Cardiologist (physician) and Advanced Practice Providers (APPs -  Physician Assistants and Nurse Practitioners) who all work together to provide you with the care you need, when you need it.  We recommend signing up for the patient portal called MyChart.  Sign up information is provided on this After Visit Summary.  MyChart is used to connect with patients for Virtual Visits (Telemedicine).  Patients are able to view lab/test results, encounter notes, upcoming appointments, etc.  Non-urgent messages can be sent to your provider as well.   To learn more about what you can do with MyChart, go to [email or web address].    Your next appointment:   3 month(s)  Provider:  You may see Redell Cave, MD or one of the following Advanced Practice Providers on your designated Care Team:    Tylene Lunch, NP   Other Instructions N/a

## 2023-03-04 NOTE — Patient Instructions (Addendum)
 Your procedure is scheduled on: Wednesday 03/09/23 To find out your arrival time, please call [phone] between 1PM - 3PM on: Tuesday 03/08/23   Report to the Registration Desk on the 1st floor of the Medical Mall. Free Valet parking is available.  If your arrival time is 6:00 am, do not arrive before that time as the Medical Mall entrance doors do not open until 6:00 am.  REMEMBER: Instructions that are not followed completely may result in serious medical risk, up to and including death; or upon the discretion of your surgeon and anesthesiologist your surgery may need to be rescheduled.  Do not eat food after midnight the night before surgery.  No gum chewing or hard candies.  You may however, drink CLEAR liquids up to 2 hours before you are scheduled to arrive for your surgery. Do not drink anything within 2 hours of your scheduled arrival time.  Clear liquids include: - water  Type 1 and Type 2 diabetics should only drink water.  One week prior to surgery: Stop Anti-inflammatories (NSAIDS) such as Advil, Aleve , Ibuprofen, Motrin, Naproxen , Naprosyn  and Aspirin  based products such as Excedrin, Goody's Powder, BC Powder. You may however, continue to take Tylenol  if needed for pain up until the day of surgery.  Stop ANY OVER THE COUNTER supplements and vitamins until after surgery.  Continue taking all prescribed medications with the exception of the following: Metformin , stop for 2 days with last dose on Sunday 03/06/23. No sildenafil  (VIAGRA ) for the 2 days before your surgery. Aspirin , should be held since 03/01/23 if not please contact Dr Jordis to be aware.  TAKE ONLY THESE MEDICATIONS THE MORNING OF SURGERY WITH A SIP OF WATER:  hydrALAZINE  (APRESOLINE )  levothyroxine  (SYNTHROID )  metoprolol  succinate (TOPROL  XL)  rosuvastatin  (CRESTOR )    No Alcohol for 24 hours before or after surgery.  No Smoking including e-cigarettes for 24 hours before surgery.  No chewable tobacco  products for at least 6 hours before surgery.  No nicotine patches on the day of surgery.  Do not use any recreational drugs for at least a week (preferably 2 weeks) before your surgery.  Please be advised that the combination of cocaine and anesthesia may have negative outcomes, up to and including death. If you test positive for cocaine, your surgery will be cancelled.  On the morning of surgery brush your teeth with toothpaste and water, you may rinse your mouth with mouthwash if you wish. Do not swallow any toothpaste or mouthwash.  Use CHG Soap or wipes as directed on instruction sheet.  Do not wear lotions, powders, or perfumes.   Do not shave body hair from the neck down 48 hours before surgery.  Wear clean comfortable clothing (specific to your surgery type) to the hospital.  Do not wear jewelry, make-up, hairpins, clips or nail polish.  For welded (permanent) jewelry: bracelets, anklets, waist bands, etc.  Please have this removed prior to surgery.  If it is not removed, there is a chance that hospital personnel will need to cut it off on the day of surgery. Contact lenses, hearing aids and dentures may not be worn into surgery.  Do not bring valuables to the hospital. [facility] is not responsible for any missing/lost belongings or valuables.   Notify your doctor if there is any change in your medical condition (cold, fever, infection).  If you are being discharged the day of surgery, you will not be allowed to drive home. You will need a responsible individual  to drive you home and stay with you for 24 hours after surgery.   If you are taking public transportation, you will need to have a responsible individual with you.  If you are being admitted to the hospital overnight, leave your suitcase in the car. After surgery it may be brought to your room.  In case of increased patient census, it may be necessary for you, the patient, to continue your postoperative care in  the Same Day Surgery department.  After surgery, you can help prevent lung complications by doing breathing exercises.  Take deep breaths and cough every 1-2 hours. Your doctor may order a device called an Incentive Spirometer to help you take deep breaths. When coughing or sneezing, hold a pillow firmly against your incision with both hands. This is called "splinting." Doing this helps protect your incision. It also decreases belly discomfort.  Surgery Visitation Policy:  Patients undergoing a surgery or procedure may have two family members or support persons with them as long as the person is not COVID-19 positive or experiencing its symptoms.   Inpatient Visitation:    Visiting hours are 7 a.m. to 8 p.m. Up to four visitors are allowed at one time in a patient room. The visitors may rotate out with other people during the day. One designated support person (adult) may remain overnight.  Due to an increase in RSV and influenza rates and associated hospitalizations, children ages 62 and under will not be able to visit patients in [facility]. Masks continue to be strongly recommended.  Please call the Pre-admissions Testing Dept. at [phone] if you have any questions about these instructions.     Preparing for Surgery with CHLORHEXIDINE  GLUCONATE (CHG) Soap  Chlorhexidine  Gluconate (CHG) Soap  o An antiseptic cleaner that kills germs and bonds with the skin to continue killing germs even after washing  o Used for showering the night before surgery and morning of surgery  Before surgery, you can play an important role by reducing the number of germs on your skin.  CHG (Chlorhexidine  gluconate) soap is an antiseptic cleanser which kills germs and bonds with the skin to continue killing germs even after washing.  Please do not use if you have an allergy to CHG or antibacterial soaps. If your skin becomes reddened/irritated stop using the CHG.  1. Shower the NIGHT  BEFORE SURGERY and the MORNING OF SURGERY with CHG soap.  2. If you choose to wash your hair, wash your hair first as usual with your normal shampoo.  3. After shampooing, rinse your hair and body thoroughly to remove the shampoo.  4. Use CHG as you would any other liquid soap. You can apply CHG directly to the skin and wash gently with a scrungie or a clean washcloth.  5. Apply the CHG soap to your body only from the neck down. Do not use on open wounds or open sores. Avoid contact with your eyes, ears, mouth, and genitals (private parts). Wash face and genitals (private parts) with your normal soap.  6. Wash thoroughly, paying special attention to the area where your surgery will be performed.  7. Thoroughly rinse your body with warm water.  8. Do not shower/wash with your normal soap after using and rinsing off the CHG soap.  9. Pat yourself dry with a clean towel.  10. Wear clean pajamas to bed the night before surgery.  12. Place clean sheets on your bed the night of your first shower and do  not sleep with pets.  13. Shower again with the CHG soap on the day of surgery prior to arriving at the hospital.  14. Do not apply any deodorants/lotions/powders.  15. Please wear clean clothes to the hospital.

## 2023-03-05 NOTE — Progress Notes (Signed)
 Outpatient Surgical Follow Up    Paul Dewoody [name] is an 78 y.o. male.   Chief Complaint  Patient presents with   Follow-up    Right colectomy 09/28/22    HPI: Paul Briggs following for recurrent Right Inguinal hernia, He did have a CT scan that I personally reviewed showing no evidence of leak abscess or complications.  CT there is evidence of a right inguinal hernia. Ambulating taking Po, + BM Complains of right inguinal bulge and intermittent pain that is dull and moderate worsening with  Valsalva.  He feels that his intestines are going into the right groin. He states that he does not remember whether or not he had inguinal hernia as a child.  No evidence of adult inguinal hernia repairs however on exam he does have a right inguinal scar.  Past Medical History:  Diagnosis Date   Adenoma of left adrenal gland    Anemia    Aortic atherosclerosis (HCC)    Blood transfusion without reported diagnosis    BPH (benign prostatic hyperplasia)    Bradycardia    CAD (coronary artery disease) 08/26/2022   a.) cCTA 08/26/2022: Ca2+ 14.3 (12th %'ile for age/sex.race match control); (<25%) pLAD   Cellulitis of left thigh 08/2022   CHF (congestive heart failure) (HCC)    a.) TTE 10/23/2015: EF 60-65%, mod-sev LVH, mild biatrial dil, degen MV disease, AoV sclerosis, asc Ao 38 mm, G1DD; b.) TTE 08/05/2022: EF 45-50%, mod-sev LVH (speckled pattern), sev biatrial dil, mild MR, AoV sclerosis, G1DD   Chronic pain syndrome    a.) on COT managed by pain management   Chronic, continuous use of opioids    a.) chronic pain syndrome/chronic back pain; managed by pain management   CKD (chronic kidney disease) stage 3, GFR 30-59 ml/min (HCC)    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   a.) Rai stage III   DDD (degenerative disc disease), cervical    Difficult airway 10/22/2015   a.) 1st attempt with glidescope  --> macroglossic (unsuccessful); b.) 2nd attempt with Mac 4 and direct  laryngoscopy with a 8.0 ETT --> tube too large to pass through the cords; b.) 3rd attempt (successful) with a Mac 4 and a 7.5 tube   Diverticulosis    DM (diabetes mellitus), type 2 (HCC)    Dysplasia of prostate    Erectile dysfunction    Frequent falls    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   History of MRSA infection 08/28/2022   a.) MRSA PCR (+) 08/28/2022; culture from LEFT thigh abscess   HTN (hypertension)    Hyperlipidemia    Hypogonadism in male    Hypothyroidism    IDA (iron  deficiency anemia)    LBBB (left bundle branch block)    Long-term use of aspirin  therapy    Lumbar spinal stenosis    Mild cardiomegaly    Myocardial infarction (HCC)    Nodule of right lung    Orchitis and epididymitis 06/16/2013   OSA on CPAP    Palindromic rheumatism, hand    Pericardial effusion    Prostatitis    Sepsis (HCC) 08/2022   Subdural hematoma (HCC) 10/22/2015   a.) s/p traumatic mechanical fall --> CT head 10/22/2015: high-density SDH the left cerebral convexity with13 mm midline shift --> s/p  LEFT frontal burr hole craniotomy   Tubular adenoma of colon    Ventral hernia     Past Surgical History:  Procedure Laterality Date   BACK SURGERY     BIOPSY  08/21/2022   Procedure: BIOPSY;  Surgeon: Onita Elspeth Sharper, DO;  Location: [facility] ENDOSCOPY;  Service: Gastroenterology;;   BIOPSY  08/23/2022   Procedure: BIOPSY;  Surgeon: Onita Elspeth Sharper, DO;  Location: [facility] ENDOSCOPY;  Service: Gastroenterology;;   BIOPSY  02/28/2023   Procedure: BIOPSY;  Surgeon: Onita Elspeth Sharper, DO;  Location[facility] ENDOSCOPY;  Service: Gastroenterology;;   SOLMON DAKIN OF CRANIUM Left 10/22/2015   COLON SURGERY  09/28/2022   COLONOSCOPY WITH PROPOFOL  N/A 08/23/2022    Procedure: COLONOSCOPY WITH PROPOFOL ;  Surgeon: Onita Elspeth Sharper, DO;  Location: [facility] ENDOSCOPY;  Service: Gastroenterology;  Laterality: N/A;   COLONOSCOPY WITH PROPOFOL  N/A 02/28/2023   Procedure: COLONOSCOPY WITH PROPOFOL ;  Surgeon: Onita Elspeth Sharper, DO;  Location: [facility] ENDOSCOPY;  Service: Gastroenterology;  Laterality: N/A;  DM   ESOPHAGOGASTRODUODENOSCOPY (EGD) WITH PROPOFOL  N/A 08/21/2022   Procedure: ESOPHAGOGASTRODUODENOSCOPY (EGD) WITH PROPOFOL ;  Surgeon: Onita Elspeth Sharper, DO;  Location: [facility] ENDOSCOPY;  Service: Gastroenterology;  Laterality: N/A;   ESOPHAGOGASTRODUODENOSCOPY (EGD) WITH PROPOFOL  N/A 02/28/2023   Procedure: ESOPHAGOGASTRODUODENOSCOPY (EGD) WITH PROPOFOL ;  Surgeon: Onita Elspeth Sharper, DO;  Location: [facility] ENDOSCOPY;  Service: Gastroenterology;  Laterality: N/A;   GIVENS CAPSULE STUDY N/A 08/23/2022   Procedure: GIVENS CAPSULE STUDY;  Surgeon: Onita Elspeth Sharper, DO;  Location: [facility] ENDOSCOPY;  Service: Gastroenterology;  Laterality: N/A;   IRRIGATION AND DEBRIDEMENT ABSCESS Left 08/28/2022   Procedure: IRRIGATION AND DEBRIDEMENT ABSCESS LEFT UPPER THIGH/GROIN;  Surgeon: Jordis Laneta FALCON, MD;  Location: [facility] ORS;  Service: General;  Laterality: Left;   LAPAROSCOPIC RIGHT COLECTOMY Right 09/28/2022   Procedure: LAPAROSCOPIC RIGHT COLECTOMY, RNFA to assist;  Surgeon: Jordis Laneta FALCON, MD;  Location: [facility] ORS;  Service: General;  Laterality: Right;   POLYPECTOMY  08/23/2022   Procedure: POLYPECTOMY;  Surgeon: Onita Elspeth Sharper, DO;  Location: [facility] ENDOSCOPY;  Service: Gastroenterology;;   SUBMUCOSAL TATTOO INJECTION  08/23/2022   Procedure: SUBMUCOSAL TATTOO INJECTION;  Surgeon: Onita Elspeth Sharper, DO;  Location: [facility] ENDOSCOPY;  Service: Gastroenterology;;   TONSILLECTOMY     VENTRAL HERNIA REPAIR N/A 09/28/2022   Procedure: HERNIA REPAIR VENTRAL ADULT;  Surgeon: Jordis Laneta FALCON, MD;  Location: [facility] ORS;  Service: General;  Laterality: N/A;    Family  History  Problem Relation Age of Onset   Cancer Maternal Aunt    ADD / ADHD Daughter    Asthma Son    Prostate cancer Neg Hx    Kidney disease Neg Hx    Kidney cancer Neg Hx    Bladder Cancer Neg Hx     Social History:  reports that he has quit smoking. His smoking use included cigarettes. He has been exposed to tobacco smoke. He has never used smokeless tobacco. He reports that he does not drink alcohol and does not use drugs.  Allergies: No Known Allergies  Medications reviewed.    ROS Full ROS performed and is otherwise negative other than what is stated in HPI   BP (!) 193/79   Pulse 73   Temp 98.1 F (36.7 C) (Oral)   Ht 6' 1 (1.854 m)   Wt 225 lb (102.1 kg)   SpO2 98%   BMI 29.69 kg/m   Physical Exam  Vitals and nursing note reviewed. Exam conducted with a chaperone present.  Constitutional:  General: He is not in acute distress.    Appearance: Normal appearance. He is normal weight. He is not ill-appearing.  Cardiovascular:     Rate and Rhythm: Normal rate and regular rhythm.  Pulmonary:     Effort: Pulmonary effort is normal.     Breath sounds: Normal breath sounds.  Abdominal:     General: Abdomen is flat. There is no distension.     Palpations: Abdomen is soft. There is no mass.     Tenderness: There is no abdominal tenderness. There is no guarding or rebound.     Hernia: No hernia is present.     Comments: Midline incision healed well, no evidence of infection or hernia recurrence . Right Inguinal region w tender but reducible RIH, there is also a scar indicative of prior surgery  Genitourinary:    Penis: Normal.   Musculoskeletal:        General: No swelling. Normal range of motion.     Cervical back: Normal range of motion and neck supple. No rigidity.     Comments:  Skin:    General: Skin is warm and dry.     Capillary Refill: Capillary refill takes less than 2 seconds.  Neurological:     General: No focal deficit present.     Mental  Status: He is alert and oriented to person, place, and time.  Psychiatric:        Mood and Affect: Mood normal.        Behavior: Behavior normal.        Thought Content: Thought content normal.        Judgment: Judgment normal.    Assessment/Plan: 78 year old male status post right colectomy for colon mass with high-grade dysplasia.  No evidence of complications. Comes in today persistent symptomatic right inguinal hernia that seems to be recurrent. I believe that Given his recent prior abdominal operation I probably think that is wiser to do a right open approach with mesh.  Discussed with the patient in detail he understands.  Procedure discussed with the patient in detail.  Risk, benefits and possible medications including but not limited to: Bleeding, recurrences, chronic pain, bowel injuries. Please note that I spent 40 minutes in this encounter including personally reviewing imaging studies, coordinating his care, placing orders and performing documentation.    Laneta Luna, MD [facility] General Surgeon

## 2023-03-05 NOTE — H&P (View-Only) (Signed)
 Outpatient Surgical Follow Up    Paul Oki [name] is an 78 y.o. male.   Chief Complaint  Patient presents with   Follow-up    Right colectomy 09/28/22    HPI: Paul Briggs following for recurrent Right Inguinal hernia, He did have a CT scan that I personally reviewed showing no evidence of leak abscess or complications.  CT there is evidence of a right inguinal hernia. Ambulating taking Po, + BM Complains of right inguinal bulge and intermittent pain that is dull and moderate worsening with  Valsalva.  He feels that his intestines are going into the right groin. He states that he does not remember whether or not he had inguinal hernia as a child.  No evidence of adult inguinal hernia repairs however on exam he does have a right inguinal scar.  Past Medical History:  Diagnosis Date   Adenoma of left adrenal gland    Anemia    Aortic atherosclerosis (HCC)    Blood transfusion without reported diagnosis    BPH (benign prostatic hyperplasia)    Bradycardia    CAD (coronary artery disease) 08/26/2022   a.) cCTA 08/26/2022: Ca2+ 14.3 (12th %'ile for age/sex.race match control); (<25%) pLAD   Cellulitis of left thigh 08/2022   CHF (congestive heart failure) (HCC)    a.) TTE 10/23/2015: EF 60-65%, mod-sev LVH, mild biatrial dil, degen MV disease, AoV sclerosis, asc Ao 38 mm, G1DD; b.) TTE 08/05/2022: EF 45-50%, mod-sev LVH (speckled pattern), sev biatrial dil, mild MR, AoV sclerosis, G1DD   Chronic pain syndrome    a.) on COT managed by pain management   Chronic, continuous use of opioids    a.) chronic pain syndrome/chronic back pain; managed by pain management   CKD (chronic kidney disease) stage 3, GFR 30-59 ml/min (HCC)    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   a.) Rai stage III   DDD (degenerative disc disease), cervical    Difficult airway 10/22/2015   a.) 1st attempt with glidescope  --> macroglossic (unsuccessful); b.) 2nd attempt with Mac 4 and direct  laryngoscopy with a 8.0 ETT --> tube too large to pass through the cords; b.) 3rd attempt (successful) with a Mac 4 and a 7.5 tube   Diverticulosis    DM (diabetes mellitus), type 2 (HCC)    Dysplasia of prostate    Erectile dysfunction    Frequent falls    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   History of MRSA infection 08/28/2022   a.) MRSA PCR (+) 08/28/2022; culture from LEFT thigh abscess   HTN (hypertension)    Hyperlipidemia    Hypogonadism in male    Hypothyroidism    IDA (iron  deficiency anemia)    LBBB (left bundle branch block)    Long-term use of aspirin  therapy    Lumbar spinal stenosis    Mild cardiomegaly    Myocardial infarction (HCC)    Nodule of right lung    Orchitis and epididymitis 06/16/2013   OSA on CPAP    Palindromic rheumatism, hand    Pericardial effusion    Prostatitis    Sepsis (HCC) 08/2022   Subdural hematoma (HCC) 10/22/2015   a.) s/p traumatic mechanical fall --> CT head 10/22/2015: high-density SDH the left cerebral convexity with13 mm midline shift --> s/p  LEFT frontal burr hole craniotomy   Tubular adenoma of colon    Ventral hernia     Past Surgical History:  Procedure Laterality Date   BACK SURGERY     BIOPSY  08/21/2022   Procedure: BIOPSY;  Surgeon: Onita Elspeth Sharper, DO;  Location: [facility] ENDOSCOPY;  Service: Gastroenterology;;   BIOPSY  08/23/2022   Procedure: BIOPSY;  Surgeon: Onita Elspeth Sharper, DO;  Location: [facility] ENDOSCOPY;  Service: Gastroenterology;;   BIOPSY  02/28/2023   Procedure: BIOPSY;  Surgeon: Onita Elspeth Sharper, DO;  Location: [facility] ENDOSCOPY;  Service: Gastroenterology;;   SOLMON DAKIN OF CRANIUM Left 10/22/2015   COLON SURGERY  09/28/2022   COLONOSCOPY WITH PROPOFOL  N/A 08/23/2022    Procedure: COLONOSCOPY WITH PROPOFOL ;  Surgeon: Onita Elspeth Sharper, DO;  Location: [facility] ENDOSCOPY;  Service: Gastroenterology;  Laterality: N/A;   COLONOSCOPY WITH PROPOFOL  N/A 02/28/2023   Procedure: COLONOSCOPY WITH PROPOFOL ;  Surgeon: Onita Elspeth Sharper, DO;  Location: [facility] ENDOSCOPY;  Service: Gastroenterology;  Laterality: N/A;  DM   ESOPHAGOGASTRODUODENOSCOPY (EGD) WITH PROPOFOL  N/A 08/21/2022   Procedure: ESOPHAGOGASTRODUODENOSCOPY (EGD) WITH PROPOFOL ;  Surgeon: Onita Elspeth Sharper, DO;  Location: [facility] ENDOSCOPY;  Service: Gastroenterology;  Laterality: N/A;   ESOPHAGOGASTRODUODENOSCOPY (EGD) WITH PROPOFOL  N/A 02/28/2023   Procedure: ESOPHAGOGASTRODUODENOSCOPY (EGD) WITH PROPOFOL ;  Surgeon: Onita Elspeth Sharper, DO;  Location: [facility] ENDOSCOPY;  Service: Gastroenterology;  Laterality: N/A;   GIVENS CAPSULE STUDY N/A 08/23/2022   Procedure: GIVENS CAPSULE STUDY;  Surgeon: Onita Elspeth Sharper, DO;  Location: [facility] ENDOSCOPY;  Service: Gastroenterology;  Laterality: N/A;   IRRIGATION AND DEBRIDEMENT ABSCESS Left 08/28/2022   Procedure: IRRIGATION AND DEBRIDEMENT ABSCESS LEFT UPPER THIGH/GROIN;  Surgeon: Jordis Laneta FALCON, MD;  Location: [facility] ORS;  Service: General;  Laterality: Left;   LAPAROSCOPIC RIGHT COLECTOMY Right 09/28/2022   Procedure: LAPAROSCOPIC RIGHT COLECTOMY, RNFA to assist;  Surgeon: Jordis Laneta FALCON, MD;  Location: [facility] ORS;  Service: General;  Laterality: Right;   POLYPECTOMY  08/23/2022   Procedure: POLYPECTOMY;  Surgeon: Onita Elspeth Sharper, DO;  Location: [facility] ENDOSCOPY;  Service: Gastroenterology;;   SUBMUCOSAL TATTOO INJECTION  08/23/2022   Procedure: SUBMUCOSAL TATTOO INJECTION;  Surgeon: Onita Elspeth Sharper, DO;  Location: [facility] ENDOSCOPY;  Service: Gastroenterology;;   TONSILLECTOMY     VENTRAL HERNIA REPAIR N/A 09/28/2022   Procedure: HERNIA REPAIR VENTRAL ADULT;  Surgeon: Jordis Laneta FALCON, MD;  Location: [facility] ORS;  Service: General;  Laterality: N/A;    Family  History  Problem Relation Age of Onset   Cancer Maternal Aunt    ADD / ADHD Daughter    Asthma Son    Prostate cancer Neg Hx    Kidney disease Neg Hx    Kidney cancer Neg Hx    Bladder Cancer Neg Hx     Social History:  reports that he has quit smoking. His smoking use included cigarettes. He has been exposed to tobacco smoke. He has never used smokeless tobacco. He reports that he does not drink alcohol and does not use drugs.  Allergies: No Known Allergies  Medications reviewed.    ROS Full ROS performed and is otherwise negative other than what is stated in HPI   BP (!) 193/79   Pulse 73   Temp 98.1 F (36.7 C) (Oral)   Ht 6' 1 (1.854 m)   Wt 225 lb (102.1 kg)   SpO2 98%   BMI 29.69 kg/m   Physical Exam  Vitals and nursing note reviewed. Exam conducted with a chaperone present.  Constitutional:  General: He is not in acute distress.    Appearance: Normal appearance. He is normal weight. He is not ill-appearing.  Cardiovascular:     Rate and Rhythm: Normal rate and regular rhythm.  Pulmonary:     Effort: Pulmonary effort is normal.     Breath sounds: Normal breath sounds.  Abdominal:     General: Abdomen is flat. There is no distension.     Palpations: Abdomen is soft. There is no mass.     Tenderness: There is no abdominal tenderness. There is no guarding or rebound.     Hernia: No hernia is present.     Comments: Midline incision healed well, no evidence of infection or hernia recurrence . Right Inguinal region w tender but reducible RIH, there is also a scar indicative of prior surgery  Genitourinary:    Penis: Normal.   Musculoskeletal:        General: No swelling. Normal range of motion.     Cervical back: Normal range of motion and neck supple. No rigidity.     Comments:  Skin:    General: Skin is warm and dry.     Capillary Refill: Capillary refill takes less than 2 seconds.  Neurological:     General: No focal deficit present.     Mental  Status: He is alert and oriented to person, place, and time.  Psychiatric:        Mood and Affect: Mood normal.        Behavior: Behavior normal.        Thought Content: Thought content normal.        Judgment: Judgment normal.    Assessment/Plan: 78 year old male status post right colectomy for colon mass with high-grade dysplasia.  No evidence of complications. Comes in today persistent symptomatic right inguinal hernia that seems to be recurrent. I believe that Given his recent prior abdominal operation I probably think that is wiser to do a right open approach with mesh.  Discussed with the patient in detail he understands.  Procedure discussed with the patient in detail.  Risk, benefits and possible medications including but not limited to: Bleeding, recurrences, chronic pain, bowel injuries. Please note that I spent 40 minutes in this encounter including personally reviewing imaging studies, coordinating his care, placing orders and performing documentation.    Laneta Luna, MD [facility] General Surgeon

## 2023-03-08 ENCOUNTER — Encounter: Payer: Self-pay | Admitting: Surgery

## 2023-03-08 ENCOUNTER — Other Ambulatory Visit: Payer: Self-pay

## 2023-03-08 NOTE — Progress Notes (Signed)
 Perioperative / Anesthesia Services  Pre-Admission Testing Clinical Review / Pre-Operative Anesthesia Consult  Date: 03/08/23  Patient Demographics:  Name: Paul Nees [name] DOB:   25-Nov-1945 MRN:   [id number]  Planned Surgical Procedure(s):    Case: [id number] Date/Time: 03/09/23 1216   Procedure: HERNIA REPAIR INGUINAL ADULT, open, RNFA to assist (Right)   Anesthesia type: General   Pre-op diagnosis: inguinal hernia non recurrent incarcerated   Location: [facility] OR ROOM 05 / [facility]S [facility]   Surgeons: Paul Briggs, Paul Briggs     NOTE: Available PAT nursing documentation and vital signs have been reviewed. Clinical nursing staff has updated patient's PMH/PSHx, current medication list, and drug allergies/intolerances to ensure comprehensive history available to assist in medical decision making as it pertains to the aforementioned surgical procedure and anticipated anesthetic course. Extensive review of available clinical information personally performed. Kimberly PMH and PSHx updated with any diagnoses/procedures that  may have been inadvertently omitted during his intake with the pre-admission testing department's nursing staff.  Clinical Discussion:  Paul Deren [name] is a 78 y.o. male who is submitted for pre-surgical anesthesia review and clearance prior to him undergoing the above procedure. Patient is a Former Games developer. Pertinent PMH includes: CAD, CHF, LBBB, cardiomegaly, bradycardia, aortic atherosclerosis, HTN, HLD, T2DM, hypothyroidism, SDH (s/p LEFT burr hole craniotomy), CKD-III, RIGHT lung nodules, OSAH (on nocturnal PAP therapy), IDA, CLL (Rai stage III), hepatic flexure mass (s/p RIGHT hemicolectomy), ventral hernia, BPH, MRSA (LEFT thigh cellulitis), cervical DDD, lumbar spinal stenosis, frequent falls.  Patient is followed by cardiology Azucena Cecil, Paul Briggs). He was last seen in the cardiology clinic on 03/04/2023; notes reviewed. At the time of his clinic  visit, patient doing well overall from a cardiovascular perspective. Patient off medications citing that he lost all of the a week ago; requesting refills. Patient with some mild shortness of breath that he reports to be stable and at baseline. Patient denied any chest pain, PND, orthopnea, palpitations, significant peripheral edema, fatigue, vertiginous symptoms, or presyncope/syncope. Patient with a past medical history significant for cardiovascular diagnoses. Documented physical exam was grossly benign, providing no evidence of acute exacerbation and/or decompensation of the patient's known cardiovascular conditions.  TTE performed on 08/05/2022 revealed a mildly reduced left ventricular systolic function with an EF of 45-50%.  There were no regional wall motion abnormalities.  Moderate to severe LVH noted (speckled appearance). Left ventricular diastolic Doppler parameters consistent with abnormal relaxation (G1DD).  GLS -18.8%.  Right ventricular size and function normal.  Left atrium severely dilated.  Right atrium mildly dilated.  There was mild mitral valve regurgitation.  Aortic valve sclerotic. Aorta normal in size with no evidence of ectasia or aneurysmal dilatation. Workup for cardiac amyloidosis was recommended.  Cardiac CTA was performed on 08/26/2022 revealing an elevated coronary calcium score of 14.3.  This placed patient in the 12th percentile for age/sex/race matched control.  Study suggestive of a minimal (less than 25%) stenosis in the proximal LAD territory.  Patient with normal coronary origin and RIGHT-sided dominance.  Study also revealed stable noncalcified 3 mm RIGHT upper lobe and 4 mm RIGHT lower lobe pulmonary nodules.  Heart failure and blood pressure currently being managed on prescribed vasodilator (hydralazine), beta-blocker (metoprolol succinate), and ARB/ANRi (Entresto) therapies.  Blood pressure elevated at 157/73 mmHg.  Patient is on rosuvastatin for his HLD diagnosis and  ASCVD prevention. T2DM well controlled on currently prescribed regimen; last HgbA1c was 5.9% when checked on 12/22/2022.  In the setting of  known cardiovascular disease and concurrent T2DM, patient is on an SGLT2i (dapagliflozin) for added cardiovascular and renovascular protection.  He does have an OSAH diagnosis and is reported be compliant with prescribed nocturnal PAP therapy. Patient is able to complete all of his  ADL/IADLs without cardiovascular limitation.  Per the DASI, patient is able to achieve at least 4 METS of physical activity without experiencing any significant degree of angina/anginal equivalent symptoms. No changes were made to his medication regimen during his visit with cardiology.  Patient scheduled to follow-up with outpatient cardiology in 3 months or sooner if need.   Paul Ana [name] Is scheduled for a HERNIA REPAIR INGUINAL ADULT, open, RNFA to assist (Right) with Dr. Sterling Big, Paul Briggs on 03/09/2023. Given patient's past medical history significant for cardiovascular diagnoses, presurgical cardiac clearance was sought by the PAT team.  Per cardiology, "overall patient is stable from a cardiac perspective. We will refill all cardiac medications today.  He has trace lower leg edema, which is stable. EKG shows TWI inf leads, but he denies anginal symptoms. Patient needs cMRI, but this should not change overall risk. cMRI scheduled for March 5th. METS >4. According to RCRI he is low risk, 6 % risk of MACE. Can hold ASA per surgeon instructions. No further cardiac work-up prior to surgery".   In review of his medication reconciliation, it is noted that patient is currently on prescribed daily antithrombotic therapy. He has been instructed on recommendations for holding his daily low-dose ASA for 7 days prior to his procedure with plans to restart as soon as postoperative bleeding risk felt to be minimized by his attending surgeon. The patient has been instructed that his last dose of  his ASA should be on 03/01/2023.  Patient reports previous perioperative complications with anesthesia in the past.  Documentation indicates that patient had a (+) difficult intubation back in 09/2015.  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 Second attempt made using direct laryngoscopy with MAC 4 blade and 8.0 ETT, however tube was too large to pass through the cords.  Third attempt successful using MAC 4 and a 7.5 ETT.  Information regarding airway difficulties added to EMR for continuity of care purposes to ensure that information is communicated to anesthesia team.  in review of the available records, it is noted that patient underwent a general anesthetic course here at [facility] (ASA III) in 02/2023 without documented complications.      03/04/2023    2:02 PM 03/04/2023    9:29 AM 03/02/2023    1:35 PM  Vitals with BMI  Height 6\' 1"  6\' 1"  6\' 1"   Weight 225 lbs 224 lbs 8 oz 225 lbs  BMI 29.69 29.63 29.69  Systolic  150 193  Diastolic  73 79  Pulse  57 73    Providers/Specialists:   NOTE: Primary physician provider listed below. Patient may have been seen by APP or partner within same practice.   PROVIDER ROLE / SPECIALTY LAST Paul Hensen, Paul Briggs General Surgery (Surgeon) 03/02/2023  Paul Corner, Paul Briggs Primary Care Provider 12/22/2022  Paul Odea, Paul Briggs Cardiology 03/04/2023  Paul Patience, Paul Briggs Medical Oncology 02/18/2023   Allergies:  Patient has no known allergies.  Current Home Medications:   No current facility-administered medications for this encounter.    aspirin EC 81 MG tablet   calcium-vitamin D (OSCAL WITH D) 500-5 MG-MCG tablet   levothyroxine (SYNTHROID) 200 MCG tablet  metFORMIN (GLUCOPHAGE) 500 MG tablet   oxyCODONE-acetaminophen (PERCOCET) 10-325 MG tablet   zanubrutinib (BRUKINSA) 80 MG capsule   cyanocobalamin (VITAMIN B12) 1000 MCG tablet   dapagliflozin  propanediol (FARXIGA) 10 MG TABS tablet   hydrALAZINE (APRESOLINE) 50 MG tablet   methocarbamol (ROBAXIN) 500 MG tablet   metoprolol succinate (TOPROL XL) 25 MG 24 hr tablet   oxyCODONE-acetaminophen (PERCOCET) 10-325 MG tablet   pregabalin (LYRICA) 50 MG capsule   rosuvastatin (CRESTOR) 40 MG tablet   sacubitril-valsartan (ENTRESTO) 24-26 MG   sildenafil (VIAGRA) 100 MG tablet   History:   Past Medical History:  Diagnosis Date   Adenoma of left adrenal gland    Anemia    Aortic atherosclerosis (HCC)    Blood transfusion without reported diagnosis    BPH (benign prostatic hyperplasia)    Bradycardia    CAD (coronary artery disease) 08/26/2022   a.) cCTA 08/26/2022: Ca2+ 14.3 (12th %'ile for age/sex.race match control); (<25%) pLAD   Cellulitis of left thigh 08/2022   Chronic pain syndrome    a.) on COT managed by pain management   Chronic, continuous use of opioids    a.) chronic pain syndrome/chronic back pain; managed by pain management   CKD (chronic kidney disease) stage 3, GFR 30-59 ml/min (HCC)    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   a.) Rai stage III   DDD (degenerative disc disease), cervical    Difficult airway 10/22/2015   a.) 1st attempt with glidescope  --> macroglossic (unsuccessful); b.) 2nd attempt with Mac 4 and direct laryngoscopy with a 8.0 ETT --> tube too large to pass through the cords; b.) 3rd attempt (successful) with a Mac 4 and a 7.5 tube   Diverticulosis    DM (diabetes mellitus), type 2 (HCC)    Dysplasia of prostate    Erectile dysfunction    Frequent falls    Heart failure with mildly reduced ejection fraction (HFmrEF) (HCC)    a.) TTE 10/23/2015: EF 60-65%, mod-sev LVH, mild biatrial dil, degen MV disease, AoV sclerosis, asc Ao 38 mm, G1DD; b.) TTE 08/05/2022: EF 45-50%, mod-sev LVH (speckled pattern), sev biatrial dil, mild MR, AoV sclerosis, G1DD   Hepatic flexure mass 08/23/2022   a.) colonoscopy 08/23/2022: 14 x 15 mm  partially obstructing ulcerated distal ascending colon mass; tissue friable --> pathology resulted as tubular adenoma, however felt to be false (-) --> referral to sugery and colectomy recommended; b.) s/p RIGHT hemicolectomy 09/28/2022   History of MRSA infection 08/28/2022   a.) MRSA PCR (+) 08/28/2022; culture from LEFT thigh abscess   HTN (hypertension)    Hyperlipidemia    Hypogonadism in male    Hypothyroidism    IDA (iron deficiency anemia)    LBBB (left bundle branch block)    Long-term use of aspirin therapy    Lumbar spinal stenosis    Mild cardiomegaly    Multiple lung nodules on CT    NICM (nonischemic cardiomyopathy) (HCC)    a.) TTE 10/23/2015: EF 60-65; b.) TTE 08/05/2022: EF 45-50%   Orchitis and epididymitis 06/16/2013   OSA on CPAP    Palindromic rheumatism, hand    Pericardial effusion    Prostatitis    Sepsis (HCC) 08/2022   Subdural hematoma (HCC) 10/22/2015   a.) s/p traumatic mechanical fall --> CT head 10/22/2015: high-density SDH the left cerebral convexity with13 mm midline shift --> s/p LEFT frontal burr hole craniotomy   Tubular adenoma of colon    Ventral hernia  Past Surgical History:  Procedure Laterality Date   BACK SURGERY     BIOPSY  08/21/2022   Procedure: BIOPSY;  Surgeon: Jaynie Collins, DO;  Location: [facility] ENDOSCOPY;  Service: Gastroenterology;;   BIOPSY  08/23/2022   Procedure: BIOPSY;  Surgeon: Jaynie Collins, DO;  Location: [facility] ENDOSCOPY;  Service: Gastroenterology;;   BIOPSY  02/28/2023   Procedure: BIOPSY;  Surgeon: Jaynie Collins, DO;  Location: [facility] ENDOSCOPY;  Service: Gastroenterology;;   Guss Bunde OF CRANIUM Left 10/22/2015   COLON SURGERY  09/28/2022   COLONOSCOPY WITH PROPOFOL N/A 08/23/2022   Procedure: COLONOSCOPY WITH PROPOFOL;  Surgeon: Jaynie Collins, DO;  Location: [facility] ENDOSCOPY;  Service: Gastroenterology;  Laterality: N/A;   COLONOSCOPY WITH PROPOFOL N/A 02/28/2023   Procedure: COLONOSCOPY WITH  PROPOFOL;  Surgeon: Jaynie Collins, DO;  Location[facility] ENDOSCOPY;  Service: Gastroenterology;  Laterality: N/A;  DM   ESOPHAGOGASTRODUODENOSCOPY (EGD) WITH PROPOFOL N/A 08/21/2022   Procedure: ESOPHAGOGASTRODUODENOSCOPY (EGD) WITH PROPOFOL;  Surgeon: Jaynie Collins, DO;  Location: [facility] ENDOSCOPY;  Service: Gastroenterology;  Laterality: N/A;   ESOPHAGOGASTRODUODENOSCOPY (EGD) WITH PROPOFOL N/A 02/28/2023   Procedure: ESOPHAGOGASTRODUODENOSCOPY (EGD) WITH PROPOFOL;  Surgeon: Jaynie Collins, DO;  Location: [facility] ENDOSCOPY;  Service: Gastroenterology;  Laterality: N/A;   GIVENS CAPSULE STUDY N/A 08/23/2022   Procedure: GIVENS CAPSULE STUDY;  Surgeon: Jaynie Collins, DO;  Location: [facility] ENDOSCOPY;  Service: Gastroenterology;  Laterality: N/A;   IRRIGATION AND DEBRIDEMENT ABSCESS Left 08/28/2022   Procedure: IRRIGATION AND DEBRIDEMENT ABSCESS LEFT UPPER THIGH/GROIN;  Surgeon: Paul Briggs, Paul Briggs;  Location: [facility] ORS;  Service: General;  Laterality: Left;   LAPAROSCOPIC RIGHT COLECTOMY Right 09/28/2022   Procedure: LAPAROSCOPIC RIGHT COLECTOMY, RNFA to assist;  Surgeon: Paul Briggs, Paul Briggs;  Location: [facility] ORS;  Service: General;  Laterality: Right;   POLYPECTOMY  08/23/2022   Procedure: POLYPECTOMY;  Surgeon: Jaynie Collins, DO;  Location: [facility] ENDOSCOPY;  Service: Gastroenterology;;   SUBMUCOSAL TATTOO INJECTION  08/23/2022   Procedure: SUBMUCOSAL TATTOO INJECTION;  Surgeon: Jaynie Collins, DO;  Location: [facility] ENDOSCOPY;  Service: Gastroenterology;;   TONSILLECTOMY     VENTRAL HERNIA REPAIR N/A 09/28/2022   Procedure: HERNIA REPAIR VENTRAL ADULT;  Surgeon: Paul Briggs, Paul Briggs;  Location: [facility] ORS;  Service: General;  Laterality: N/A;   Family History  Problem Relation Age of Onset   Cancer Maternal Aunt    ADD / ADHD Daughter    Asthma Son    Prostate cancer Neg Hx    Kidney disease Neg Hx    Kidney cancer Neg Hx    Bladder Cancer Neg Hx    Social History    Tobacco Use   Smoking status: Former    Types: Cigarettes    Passive exposure: Past   Smokeless tobacco: Never   Tobacco comments:    Stop smoking iver 40  plus years ago  Vaping Use   Vaping status: Never Used  Substance Use Topics   Alcohol use: No   Drug use: No    Pertinent Clinical Results:  LABS:   Lab Results  Component Value Date   WBC 11.9 (H) 02/18/2023   HGB 8.6 (L) 02/18/2023   HCT 27.8 (L) 02/18/2023   MCV 87.7 02/18/2023   PLT 182 02/18/2023   Lab Results  Component Value Date   NA 137 02/18/2023   K 4.2 02/18/2023   CO2 26 02/18/2023   GLUCOSE 213 (H) 02/18/2023   BUN 18 02/18/2023   CREATININE 0.89  02/18/2023   CALCIUM 8.5 (L) 02/18/2023   EGFR 70 12/22/2022   GFRNONAA >60 02/18/2023    ECG: Date: 03/04/2023 Time ECG obtained: 0938 AM Rate: 57 bpm Rhythm:  SR with 1st degree AV block with occasional PVCs; LBBB Axis (leads I and aVF): Left axis deviation Intervals: PR 232 ms. QRS 148 ms. QTc 408 ms. ST segment and T wave changes: Lateral T wave inversions more evident Comparison: Similar to previous tracing obtained on 08/26/2022   IMAGING / PROCEDURES: CT CORONARY MORPH W/CTA COR W/SCORE W/CA W/CM &/OR WO/CM performed on 08/26/2022 Stable 3 mm right upper lobe and 4 mm right lower lobe noncalcified lung nodules. No follow-up needed if patient is low-risk (and has no known or suspected primary neoplasm). Non-contrast chest CT can be considered in 12 months if patient is high-risk.   Coronary calcium score of 14.3. This was 12th percentile for age and sex matched control. Normal coronary origin with right dominance. Minimal proximal LAD stenosis (<25%). CAD-RADS 1. Minimal non-obstructive CAD (0-24%). Consider preventive therapy and risk factor modification.  TRANSTHORACIC ECHOCARDIOGRAM performed on 08/05/2022 Left ventricular ejection fraction, by estimation, is 45 to 50%. Left ventricular ejection fraction by 3D volume is 47 %. The left  ventricle has mildly decreased function. The left ventricle has no regional wall motion abnormalities. 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 Left ventricular diastolic parameters are consistent with Grade I diastolic dysfunction (impaired relaxation). The average left ventricular global longitudinal strain is -18.8 %. Consider w/u for [facility] if clinically indicated.  Right ventricular systolic function is normal. The right ventricular size is normal. Tricuspid regurgitation signal is inadequate for assessing PA pressure.  Left atrial size was severely dilated.  Right atrial size was mildly dilated.  A small pericardial effusion is present. There is no evidence of cardiac tamponade.  The mitral valve is normal in structure. Mild mitral valve regurgitation. No evidence of mitral stenosis.  The aortic valve is tricuspid. Aortic valve regurgitation is not visualized. Aortic valve sclerosis/calcification is present, without any evidence of aortic stenosis.  The inferior vena cava is normal in size with greater than 50% respiratory variability, suggesting right atrial pressure of 3 mmHg.   CT ABDOMEN PELVIS W CONTRAST performed on 08/26/2022 Subcutaneous fat stranding within the left lower quadrant anterior abdominal wall extending into the anterior proximal left thigh, likely related to contusion given history of recent trauma. No fluid collection or hematoma. No acute displaced fracture. Retroperitoneal and pelvic lymphadenopathy as above, with waxing and waning appearance. 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 1.9 cm hypodensity within the medial aspect of the spleen, nonspecific. Leukemic involvement of the spleen cannot be excluded. Distal colonic diverticulosis without diverticulitis. Aortic atherosclerosis  CT CERVICAL SPINE WO CONTRAST performed on 08/26/2022 No acute intracranial pathology. Status post left frontal burr hole  craniotomy. Mucosal thickening and small air-fluid level of the right maxillary sinus. Correlate for acute sinusitis. No fracture or static subluxation of the cervical spine. 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  Impression and Plan:  Olyver Hawes [name] has been referred for pre-anesthesia review and clearance prior to him undergoing the planned anesthetic and procedural courses. Available labs, pertinent testing, and imaging results were personally reviewed by me in preparation for upcoming operative/procedural course. [facility] medical record has been updated following extensive record review and patient interview with PAT staff.   This patient has been appropriately cleared by cardiology with and overall ACCEPTABLE risk  of patient experiencing significant perioperative cardiovascular complications. Based on clinical review performed today (03/08/23), barring any significant acute changes in the patient's overall condition, it is anticipated that he will be able to proceed with the planned surgical intervention. Any acute changes in clinical condition may necessitate his procedure being postponed and/or cancelled. Patient will meet with anesthesia team (Paul Briggs and/or CRNA) on the day of his procedure for preoperative evaluation/assessment. Questions regarding anesthetic course will be fielded at that time.   Pre-surgical instructions were reviewed with the patient during his PAT appointment, and questions were fielded to satisfaction by PAT clinical staff. He has been instructed on which medications that he will need to hold prior to surgery, as well as the ones that have been deemed safe/appropriate to take on the day of his procedure. As part of the general education provided by PAT, patient made aware both verbally and in writing, that he would need to abstain from the use of any illegal substances during his perioperative course.  He was advised that failure to  follow the provided instructions could necessitate case cancellation or result in serious perioperative complications up to and including death. Patient encouraged to contact PAT and/or his surgeon's office to discuss any questions or concerns that may arise prior to surgery; verbalized understanding.   Quentin Mulling, MSN, APRN, FNP-C, CEN [facility]  Peri-operative Services Nurse Practitioner Phone: [phone] Fax: [phone] Email: [email or web address] 03/08/23 11:47 AM  NOTE: This note has been prepared using Scientist, clinical (histocompatibility and immunogenetics). Despite my best ability to proofread, there is always the potential that unintentional transcriptional errors may still occur from this process.

## 2023-03-09 ENCOUNTER — Encounter: Payer: Self-pay | Admitting: Surgery

## 2023-03-09 ENCOUNTER — Other Ambulatory Visit: Payer: Self-pay

## 2023-03-09 ENCOUNTER — Ambulatory Visit: Payer: No Typology Code available for payment source | Admitting: Urgent Care

## 2023-03-09 ENCOUNTER — Encounter
Admission: RE | Disposition: A | Discharge status: Home / Self Care | Payer: Self-pay | Source: Home / Self Care | Attending: Surgery

## 2023-03-09 ENCOUNTER — Inpatient Hospital Stay: Payer: No Typology Code available for payment source

## 2023-03-09 ENCOUNTER — Ambulatory Visit
Admission: RE | Admit: 2023-03-09 | Discharge: 2023-03-09 | Disposition: A | Discharge status: Home / Self Care | Payer: No Typology Code available for payment source | Attending: Surgery | Admitting: Surgery

## 2023-03-09 DIAGNOSIS — N183 Chronic kidney disease, stage 3 unspecified: Secondary | ICD-10-CM | POA: Diagnosis not present

## 2023-03-09 DIAGNOSIS — G4733 Obstructive sleep apnea (adult) (pediatric): Secondary | ICD-10-CM | POA: Insufficient documentation

## 2023-03-09 DIAGNOSIS — N529 Male erectile dysfunction, unspecified: Secondary | ICD-10-CM | POA: Diagnosis not present

## 2023-03-09 DIAGNOSIS — I2510 Atherosclerotic heart disease of native coronary artery without angina pectoris: Secondary | ICD-10-CM | POA: Diagnosis not present

## 2023-03-09 DIAGNOSIS — Z87891 Personal history of nicotine dependence: Secondary | ICD-10-CM | POA: Diagnosis not present

## 2023-03-09 DIAGNOSIS — E1122 Type 2 diabetes mellitus with diabetic chronic kidney disease: Secondary | ICD-10-CM | POA: Insufficient documentation

## 2023-03-09 DIAGNOSIS — G894 Chronic pain syndrome: Secondary | ICD-10-CM | POA: Insufficient documentation

## 2023-03-09 DIAGNOSIS — D176 Benign lipomatous neoplasm of spermatic cord: Secondary | ICD-10-CM | POA: Diagnosis not present

## 2023-03-09 DIAGNOSIS — K4090 Unilateral inguinal hernia, without obstruction or gangrene, not specified as recurrent: Secondary | ICD-10-CM

## 2023-03-09 DIAGNOSIS — C9110 Chronic lymphocytic leukemia of B-cell type not having achieved remission: Secondary | ICD-10-CM | POA: Diagnosis not present

## 2023-03-09 DIAGNOSIS — K4031 Unilateral inguinal hernia, with obstruction, without gangrene, recurrent: Secondary | ICD-10-CM | POA: Diagnosis not present

## 2023-03-09 DIAGNOSIS — N400 Enlarged prostate without lower urinary tract symptoms: Secondary | ICD-10-CM | POA: Insufficient documentation

## 2023-03-09 DIAGNOSIS — I509 Heart failure, unspecified: Secondary | ICD-10-CM | POA: Diagnosis not present

## 2023-03-09 DIAGNOSIS — E119 Type 2 diabetes mellitus without complications: Secondary | ICD-10-CM

## 2023-03-09 DIAGNOSIS — I700 Atherosclerosis of aorta: Secondary | ICD-10-CM | POA: Diagnosis not present

## 2023-03-09 DIAGNOSIS — K4030 Unilateral inguinal hernia, with obstruction, without gangrene, not specified as recurrent: Secondary | ICD-10-CM | POA: Diagnosis not present

## 2023-03-09 DIAGNOSIS — K4091 Unilateral inguinal hernia, without obstruction or gangrene, recurrent: Secondary | ICD-10-CM

## 2023-03-09 DIAGNOSIS — I130 Hypertensive heart and chronic kidney disease with heart failure and stage 1 through stage 4 chronic kidney disease, or unspecified chronic kidney disease: Secondary | ICD-10-CM | POA: Insufficient documentation

## 2023-03-09 DIAGNOSIS — E785 Hyperlipidemia, unspecified: Secondary | ICD-10-CM | POA: Diagnosis not present

## 2023-03-09 HISTORY — DX: Other nonspecific abnormal finding of lung field: R91.8

## 2023-03-09 HISTORY — DX: Chronic systolic (congestive) heart failure: I50.22

## 2023-03-09 HISTORY — DX: Other cardiomyopathies: I42.8

## 2023-03-09 HISTORY — PX: INGUINAL HERNIA REPAIR: SHX194

## 2023-03-09 HISTORY — DX: Unspecified systolic (congestive) heart failure: I50.20

## 2023-03-09 LAB — GLUCOSE, CAPILLARY
Glucose-Capillary: 92 mg/dL (ref 70–99)
Glucose-Capillary: 146 mg/dL — ABNORMAL HIGH (ref 70–99)

## 2023-03-09 SURGERY — REPAIR, HERNIA, INGUINAL, ADULT
Anesthesia: General | Site: Abdomen | Laterality: Right

## 2023-03-09 MED ORDER — OXYCODONE HCL 5 MG PO TABS
ORAL_TABLET | ORAL | Status: AC
  Filled 2023-03-09: qty 1

## 2023-03-09 MED ORDER — GABAPENTIN 300 MG PO CAPS
ORAL_CAPSULE | ORAL | Status: AC
  Filled 2023-03-09: qty 1

## 2023-03-09 MED ORDER — LIDOCAINE HCL (CARDIAC) PF 100 MG/5ML IV SOSY
PREFILLED_SYRINGE | INTRAVENOUS | Status: DC | PRN
Start: 2023-03-09 — End: 2023-03-09
  Administered 2023-03-09: 100 mg via INTRAVENOUS

## 2023-03-09 MED ORDER — ONDANSETRON HCL 4 MG/2ML IJ SOLN
INTRAMUSCULAR | Status: DC | PRN
  Administered 2023-03-09: 4 mg via INTRAVENOUS

## 2023-03-09 MED ORDER — LIDOCAINE HCL (PF) 2 % IJ SOLN
INTRAMUSCULAR | Status: AC
Start: 2023-03-09 — End: ?
  Filled 2023-03-09: qty 5

## 2023-03-09 MED ORDER — CEFAZOLIN SODIUM-DEXTROSE 2-4 GM/100ML-% IV SOLN
INTRAVENOUS | Status: AC
  Filled 2023-03-09: qty 100

## 2023-03-09 MED ORDER — FENTANYL CITRATE (PF) 100 MCG/2ML IJ SOLN
25.0000 ug | INTRAMUSCULAR | Status: DC | PRN
  Administered 2023-03-09 (×2): 50 ug via INTRAVENOUS

## 2023-03-09 MED ORDER — CELECOXIB 200 MG PO CAPS
200.0000 mg | ORAL_CAPSULE | ORAL | Status: AC
  Administered 2023-03-09: 200 mg via ORAL

## 2023-03-09 MED ORDER — GLYCOPYRROLATE 0.2 MG/ML IJ SOLN
INTRAMUSCULAR | Status: DC | PRN
Start: 2023-03-09 — End: 2023-03-09
  Administered 2023-03-09: .2 mg via INTRAVENOUS

## 2023-03-09 MED ORDER — DEXAMETHASONE SODIUM PHOSPHATE 10 MG/ML IJ SOLN
INTRAMUSCULAR | Status: AC
  Filled 2023-03-09: qty 1

## 2023-03-09 MED ORDER — BUPIVACAINE-EPINEPHRINE (PF) 0.25% -1:200000 IJ SOLN
INTRAMUSCULAR | Status: DC | PRN
  Administered 2023-03-09: 50 mL

## 2023-03-09 MED ORDER — HYDRALAZINE HCL 20 MG/ML IJ SOLN
INTRAMUSCULAR | Status: AC
  Filled 2023-03-09: qty 1

## 2023-03-09 MED ORDER — BUPIVACAINE-EPINEPHRINE (PF) 0.25% -1:200000 IJ SOLN
INTRAMUSCULAR | Status: AC
  Filled 2023-03-09: qty 30

## 2023-03-09 MED ORDER — FENTANYL CITRATE (PF) 100 MCG/2ML IJ SOLN
INTRAMUSCULAR | Status: AC
  Filled 2023-03-09: qty 2

## 2023-03-09 MED ORDER — ACETAMINOPHEN 500 MG PO TABS
ORAL_TABLET | ORAL | Status: AC
  Filled 2023-03-09: qty 2

## 2023-03-09 MED ORDER — ONDANSETRON HCL 4 MG/2ML IJ SOLN
INTRAMUSCULAR | Status: AC
Start: 2023-03-09 — End: ?
  Filled 2023-03-09: qty 2

## 2023-03-09 MED ORDER — CHLORHEXIDINE GLUCONATE CLOTH 2 % EX PADS
6.0000 | MEDICATED_PAD | Freq: Once | CUTANEOUS | Status: AC
  Administered 2023-03-09: 6 via TOPICAL

## 2023-03-09 MED ORDER — ROCURONIUM BROMIDE 100 MG/10ML IV SOLN
INTRAVENOUS | Status: DC | PRN
  Administered 2023-03-09: 50 mg via INTRAVENOUS

## 2023-03-09 MED ORDER — PROPOFOL 10 MG/ML IV BOLUS
INTRAVENOUS | Status: DC | PRN
  Administered 2023-03-09: 200 mg via INTRAVENOUS

## 2023-03-09 MED ORDER — ALBUTEROL SULFATE HFA 108 (90 BASE) MCG/ACT IN AERS
INHALATION_SPRAY | RESPIRATORY_TRACT | Status: AC
  Filled 2023-03-09: qty 6.7

## 2023-03-09 MED ORDER — DROPERIDOL 2.5 MG/ML IJ SOLN: 0.6250 mg | Freq: Once | INTRAMUSCULAR | Status: DC | PRN

## 2023-03-09 MED ORDER — OXYCODONE HCL 5 MG PO TABS: 5.0000 mg | ORAL_TABLET | Freq: Once | ORAL | Status: DC

## 2023-03-09 MED ORDER — SODIUM CHLORIDE 0.9 % IV SOLN: INTRAVENOUS | Status: DC

## 2023-03-09 MED ORDER — CHLORHEXIDINE GLUCONATE 0.12 % MT SOLN
15.0000 mL | Freq: Once | OROMUCOSAL | Status: AC
  Administered 2023-03-09: 15 mL via OROMUCOSAL

## 2023-03-09 MED ORDER — SUCCINYLCHOLINE CHLORIDE 200 MG/10ML IV SOSY
PREFILLED_SYRINGE | INTRAVENOUS | Status: AC
  Filled 2023-03-09: qty 10

## 2023-03-09 MED ORDER — ROCURONIUM BROMIDE 10 MG/ML (PF) SYRINGE
PREFILLED_SYRINGE | INTRAVENOUS | Status: AC
  Filled 2023-03-09: qty 10

## 2023-03-09 MED ORDER — OXYCODONE HCL 5 MG PO TABS: 5.0000 mg | ORAL_TABLET | ORAL | 0 refills | Status: DC | PRN

## 2023-03-09 MED ORDER — CELECOXIB 200 MG PO CAPS
ORAL_CAPSULE | ORAL | Status: AC
Start: 2023-03-09 — End: ?
  Filled 2023-03-09: qty 1

## 2023-03-09 MED ORDER — OXYCODONE HCL 5 MG PO TABS
5.0000 mg | ORAL_TABLET | Freq: Once | ORAL | Status: AC
  Administered 2023-03-09: 5 mg via ORAL

## 2023-03-09 MED ORDER — GABAPENTIN 300 MG PO CAPS
300.0000 mg | ORAL_CAPSULE | ORAL | Status: AC
  Administered 2023-03-09: 300 mg via ORAL

## 2023-03-09 MED ORDER — PHENYLEPHRINE 80 MCG/ML (10ML) SYRINGE FOR IV PUSH (FOR BLOOD PRESSURE SUPPORT)
PREFILLED_SYRINGE | INTRAVENOUS | Status: DC | PRN
  Administered 2023-03-09: 80 ug via INTRAVENOUS

## 2023-03-09 MED ORDER — CHLORHEXIDINE GLUCONATE 0.12 % MT SOLN
OROMUCOSAL | Status: AC
  Filled 2023-03-09: qty 15

## 2023-03-09 MED ORDER — HYDRALAZINE HCL 20 MG/ML IJ SOLN
5.0000 mg | Freq: Four times a day (QID) | INTRAMUSCULAR | Status: DC | PRN
  Administered 2023-03-09: 5 mg via INTRAVENOUS

## 2023-03-09 MED ORDER — ALBUTEROL SULFATE HFA 108 (90 BASE) MCG/ACT IN AERS
INHALATION_SPRAY | RESPIRATORY_TRACT | Status: DC | PRN
  Administered 2023-03-09: 4 via RESPIRATORY_TRACT

## 2023-03-09 MED ORDER — METOPROLOL TARTRATE 5 MG/5ML IV SOLN
INTRAVENOUS | Status: DC | PRN
  Administered 2023-03-09: 2.5 mg via INTRAVENOUS

## 2023-03-09 MED ORDER — PROPOFOL 10 MG/ML IV BOLUS
INTRAVENOUS | Status: AC
  Filled 2023-03-09: qty 20

## 2023-03-09 MED ORDER — EPHEDRINE SULFATE-NACL 50-0.9 MG/10ML-% IV SOSY
PREFILLED_SYRINGE | INTRAVENOUS | Status: DC | PRN
Start: 2023-03-09 — End: 2023-03-09
  Administered 2023-03-09 (×2): 5 mg via INTRAVENOUS

## 2023-03-09 MED ORDER — CHLORHEXIDINE GLUCONATE CLOTH 2 % EX PADS: 6.0000 | MEDICATED_PAD | Freq: Once | CUTANEOUS | Status: DC

## 2023-03-09 MED ORDER — BUPIVACAINE LIPOSOME 1.3 % IJ SUSP
INTRAMUSCULAR | Status: AC
  Filled 2023-03-09: qty 20

## 2023-03-09 MED ORDER — DEXAMETHASONE SODIUM PHOSPHATE 10 MG/ML IJ SOLN
INTRAMUSCULAR | Status: DC | PRN
Start: 2023-03-09 — End: 2023-03-09
  Administered 2023-03-09: 8 mg via INTRAVENOUS

## 2023-03-09 MED ORDER — ACETAMINOPHEN 500 MG PO TABS
1000.0000 mg | ORAL_TABLET | ORAL | Status: AC
  Administered 2023-03-09: 1000 mg via ORAL

## 2023-03-09 MED ORDER — ORAL CARE MOUTH RINSE: 15.0000 mL | Freq: Once | OROMUCOSAL | Status: AC

## 2023-03-09 MED ORDER — CEFAZOLIN SODIUM-DEXTROSE 2-4 GM/100ML-% IV SOLN
2.0000 g | INTRAVENOUS | Status: AC
  Administered 2023-03-09: 2 g via INTRAVENOUS

## 2023-03-09 MED ORDER — METOPROLOL TARTRATE 5 MG/5ML IV SOLN
INTRAVENOUS | Status: AC
  Filled 2023-03-09: qty 5

## 2023-03-09 MED ORDER — SUGAMMADEX SODIUM 200 MG/2ML IV SOLN
INTRAVENOUS | Status: DC | PRN
  Administered 2023-03-09: 240 mg via INTRAVENOUS

## 2023-03-09 MED ORDER — SUCCINYLCHOLINE CHLORIDE 200 MG/10ML IV SOSY
PREFILLED_SYRINGE | INTRAVENOUS | Status: DC | PRN
  Administered 2023-03-09: 120 mg via INTRAVENOUS

## 2023-03-09 MED ORDER — FENTANYL CITRATE (PF) 100 MCG/2ML IJ SOLN
INTRAMUSCULAR | Status: DC | PRN
  Administered 2023-03-09 (×2): 50 ug via INTRAVENOUS

## 2023-03-09 SURGICAL SUPPLY — 31 items
BLADE CLIPPER SURG (BLADE) ×1 IMPLANT
CHLORAPREP W/TINT 26 (MISCELLANEOUS) ×1 IMPLANT
DERMABOND ADVANCED .7 DNX12 (GAUZE/BANDAGES/DRESSINGS) ×1 IMPLANT
DRAIN PENROSE 0.625X18 (DRAIN) ×1 IMPLANT
DRAPE INCISE IOBAN 66X45 STRL (DRAPES) ×1 IMPLANT
DRAPE LAPAROTOMY 77X122 PED (DRAPES) ×1 IMPLANT
ELECT CAUTERY BLADE 6.4 (BLADE) ×1 IMPLANT
ELECT REM PT RETURN 9FT ADLT (ELECTROSURGICAL) ×1 IMPLANT
ELECTRODE REM PT RTRN 9FT ADLT (ELECTROSURGICAL) ×1 IMPLANT
GAUZE 4X4 16PLY ~~LOC~~+RFID DBL (SPONGE) ×1 IMPLANT
GLOVE BIO SURGEON STRL SZ7 (GLOVE) ×1 IMPLANT
GOWN STRL REUS W/ TWL LRG LVL3 (GOWN DISPOSABLE) ×2 IMPLANT
MANIFOLD NEPTUNE II (INSTRUMENTS) ×1 IMPLANT
MESH PARIETEX PROGRIP RIGHT (Mesh General) IMPLANT
NDL HYPO 22X1.5 SAFETY MO (MISCELLANEOUS) ×1 IMPLANT
NEEDLE HYPO 22X1.5 SAFETY MO (MISCELLANEOUS) ×1 IMPLANT
NS IRRIG 1000ML POUR BTL (IV SOLUTION) ×1 IMPLANT
NS IRRIG 500ML POUR BTL (IV SOLUTION) IMPLANT
PACK BASIN MINOR ARMC (MISCELLANEOUS) ×1 IMPLANT
SPONGE KITTNER 5P (MISCELLANEOUS) ×1 IMPLANT
SPONGE T-LAP 18X18 ~~LOC~~+RFID (SPONGE) ×1 IMPLANT
SUT ETHIBOND NAB MO 7 #0 18IN (SUTURE) ×2 IMPLANT
SUT MNCRL AB 4-0 PS2 18 (SUTURE) ×1 IMPLANT
SUT SILK 2 0SH CR/8 30 (SUTURE) IMPLANT
SUT SILK 2-0 18XBRD TIE 12 (SUTURE) IMPLANT
SUT VIC AB 2-0 SH 27XBRD (SUTURE) IMPLANT
SUT VIC AB 3-0 54X BRD REEL (SUTURE) ×1 IMPLANT
SUT VIC AB 3-0 SH 27X BRD (SUTURE) ×1 IMPLANT
SYR 20ML LL LF (SYRINGE) ×1 IMPLANT
TRAP FLUID SMOKE EVACUATOR (MISCELLANEOUS) ×1 IMPLANT
WATER STERILE IRR 500ML POUR (IV SOLUTION) ×1 IMPLANT

## 2023-03-09 NOTE — Anesthesia Preprocedure Evaluation (Signed)
Anesthesia Evaluation  Patient identified by MRN, date of birth, ID band Patient awake    Reviewed: Allergy & Precautions, NPO status , Patient's Chart, lab work & pertinent test results  History of Anesthesia Complications Negative for: history of anesthetic complications  Airway Mallampati: III  TM Distance: >3 FB Neck ROM: full    Dental  (+) Chipped, Poor Dentition, Missing, Dental Advidsory Given   Pulmonary neg shortness of breath, sleep apnea , neg COPD, neg recent URI, former smoker   Pulmonary exam normal        Cardiovascular Exercise Tolerance: Good hypertension, (-) angina + CAD and +CHF  (-) Past MI and (-) Cardiac Stents Normal cardiovascular exam+ dysrhythmias (-) Valvular Problems/Murmurs     Neuro/Psych  Headaches, neg Seizures  Neuromuscular disease  negative psych ROS   GI/Hepatic negative GI ROS, Neg liver ROS,,,  Endo/Other  diabetes, Type 2Hypothyroidism    Renal/GU CRFRenal disease     Musculoskeletal   Abdominal   Peds  Hematology negative hematology ROS (+)   Anesthesia Other Findings Past Medical History: No date: Adenoma of left adrenal gland No date: Aortic atherosclerosis (HCC) No date: BPH (benign prostatic hyperplasia) No date: Bradycardia 08/26/2022: CAD (coronary artery disease)     Comment:  a.) cCTA 08/26/2022: Ca2+ 14.3 (12th %'ile for               age/sex.race match control); (<25%) pLAD 08/2022: Cellulitis of left thigh No date: CHF (congestive heart failure) (HCC)     Comment:  a.) TTE 10/23/2015: EF 60-65%, mod-sev LVH, mild               biatrial dil, degen MV disease, AoV sclerosis, asc Ao 38               mm, G1DD; b.) TTE 08/05/2022: EF 45-50%, mod-sev LVH               (speckled pattern), sev biatrial dil, mild MR, AoV               sclerosis, G1DD No date: Chronic pain syndrome     Comment:  a.) on COT managed by pain management No date: Chronic, continuous use  of opioids     Comment:  a.) chronic pain syndrome/chronic back pain; managed by               pain management No date: CKD (chronic kidney disease) stage 3, GFR 30-59 ml/min (HCC) 12/28/2021: CLL (chronic lymphocytic leukemia) (HCC)     Comment:  a.) Rai stage III No date: DDD (degenerative disc disease), cervical 10/22/2015: Difficult airway     Comment:  a.) 1st attempt with glidescope  --> macroglossic               (unsuccessful); b.) 2nd attempt with Mac 4 and direct               laryngoscopy with a 8.0 ETT --> tube too large to pass               through the cords; b.) 3rd attempt (successful) with a               Mac 4 and a 7.5 tube No date: Diverticulosis No date: DM (diabetes mellitus), type 2 (HCC) No date: Dysplasia of prostate No date: Erectile dysfunction No date: Frequent falls 08/23/2022: Hepatic flexure mass     Comment:  a.) colonoscopy 08/23/2022: 14 x 15 mm partially  obstructing ulcerated distal ascending colon mass; tissue              friable --> pathology resulted as tubular adenoma,               however felt to be false (-) --> referral to sugery and               colectomy recommended. 08/28/2022: History of MRSA infection     Comment:  a.) MRSA PCR (+) 08/28/2022; culture from LEFT thigh               abscess No date: HTN (hypertension) No date: Hyperlipidemia No date: Hypogonadism in male No date: Hypothyroidism No date: IDA (iron deficiency anemia) No date: LBBB (left bundle branch block) No date: Long-term use of aspirin therapy No date: Lumbar spinal stenosis No date: Mild cardiomegaly No date: Nodule of right lung 06/16/2013: Orchitis and epididymitis No date: OSA on CPAP 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, hand No date: Pericardial effusion No date: Prostatitis 08/2022: Sepsis (HCC) 10/22/2015: Subdural hematoma (HCC)     Comment:  a.) s/p traumatic mechanical fall --> CT head               10/22/2015: high-density SDH the  left cerebral convexity               with13 mm midline shift --> s/p LEFT frontal burr hole               craniotomy No date: Tubular adenoma of colon No date: Ventral hernia  Past Surgical History: No date: BACK SURGERY 08/21/2022: BIOPSY     Comment:  Procedure: BIOPSY;  Surgeon: Jaynie Collins, DO;               Location: [facility] ENDOSCOPY;  Service: Gastroenterology;; 08/23/2022: BIOPSY     Comment:  Procedure: BIOPSY;  Surgeon: Jaynie Collins, DO;               Location: [facility] ENDOSCOPY;  Service: Gastroenterology;; 10/22/2015: Ines Bloomer HOLE OF CRANIUM; Left 08/23/2022: COLONOSCOPY WITH PROPOFOL; N/A     Comment:  Procedure: COLONOSCOPY WITH PROPOFOL;  Surgeon: Jaynie Collins, DO;  Location: [facility] ENDOSCOPY;  Service:               Gastroenterology;  Laterality: N/A; 08/21/2022: ESOPHAGOGASTRODUODENOSCOPY (EGD) WITH PROPOFOL; N/A     Comment:  Procedure: ESOPHAGOGASTRODUODENOSCOPY (EGD) WITH               PROPOFOL;  Surgeon: Jaynie Collins, DO;  Location:              [facility] ENDOSCOPY;  Service: Gastroenterology;  Laterality:               N/A; 08/23/2022: GIVENS CAPSULE STUDY; N/A     Comment:  Procedure: GIVENS CAPSULE STUDY;  Surgeon: Jaynie Collins, DO;  Location: [facility] ENDOSCOPY;  Service:               Gastroenterology;  Laterality: N/A; 08/28/2022: IRRIGATION AND DEBRIDEMENT ABSCESS; Left     Comment:  Procedure: IRRIGATION AND DEBRIDEMENT ABSCESS LEFT UPPER              THIGH/GROIN;  Surgeon: Leafy Ro, MD;  Location:               [facility] ORS;  Service: General;  Laterality: Left; 08/23/2022: POLYPECTOMY     Comment:  Procedure: POLYPECTOMY;  Surgeon: Jaynie Collins,              DO;  Location: [facility] ENDOSCOPY;  Service:               Gastroenterology;; 08/23/2022: SUBMUCOSAL TATTOO INJECTION     Comment:  Procedure: SUBMUCOSAL TATTOO INJECTION;  Surgeon: Jaynie Collins, DO;  Location:  [facility] ENDOSCOPY;  Service:               Gastroenterology;; No date: TONSILLECTOMY  BMI    Body Mass Index: 28.77 kg/m      Reproductive/Obstetrics negative OB ROS                             Anesthesia Physical Anesthesia Plan  ASA: 3  Anesthesia Plan: General   Post-op Pain Management:    Induction: Intravenous  PONV Risk Score and Plan: Treatment may vary due to age or medical condition, Propofol infusion and TIVA  Airway Management Planned: Natural Airway and Nasal Cannula  Additional Equipment:   Intra-op Plan:   Post-operative Plan:   Informed Consent: I have reviewed the patients History and Physical, chart, labs and discussed the procedure including the risks, benefits and alternatives for the proposed anesthesia with the patient or authorized representative who has indicated his/her understanding and acceptance.     Dental Advisory Given  Plan Discussed with: Anesthesiologist, CRNA and Surgeon  Anesthesia Plan Comments: (Patient consented for risks of anesthesia including but not limited to:  - adverse reactions to medications - damage to eyes, teeth, lips or other oral mucosa - nerve damage due to positioning  - sore throat or hoarseness - Damage to heart, brain, nerves, lungs, other parts of body or loss of life  Patient voiced understanding.)       Anesthesia Quick Evaluation

## 2023-03-09 NOTE — Discharge Instructions (Addendum)
Information for Discharge Teaching: EXPAREL (bupivacaine liposome injectable suspension)   Pain relief is important to your recovery. The goal is to control your pain so you can move easier and return to your normal activities as soon as possible after your procedure. Your physician may use several types of medicines to manage pain, swelling, and more.  Your surgeon or anesthesiologist gave you EXPAREL(bupivacaine) to help control your pain after surgery.  EXPAREL is a local anesthetic designed to release slowly over an extended period of time to provide pain relief by numbing the tissue around the surgical site. EXPAREL is designed to release pain medication over time and can control pain for up to 72 hours. Depending on how you respond to EXPAREL, you may require less pain medication during your recovery. EXPAREL can help reduce or eliminate the need for opioids during the first few days after surgery when pain relief is needed the most. EXPAREL is not an opioid and is not addictive. It does not cause sleepiness or sedation.   Important! A teal colored band has been placed on your arm with the date, time and amount of EXPAREL you have received. Please leave this armband in place for the full 96 hours following administration, and then you may remove the band. If you return to the hospital for any reason within 96 hours following the administration of EXPAREL, the armband provides important information that your health care providers to know, and alerts them that you have received this anesthetic.    Possible side effects of EXPAREL: Temporary loss of sensation or ability to move in the area where medication was injected. Nausea, vomiting, constipation Rarely, numbness and tingling in your mouth or lips, lightheadedness, or anxiety may occur. Call your doctor right away if you think you may be experiencing any of these sensations, or if you have other questions regarding possible side  effects.  Follow all other discharge instructions given to you by your surgeon or nurse. Eat a healthy diet and drink plenty of water or other fluids.  Open Hernia Repair, Adult, Care After After an open hernia repair, it's common to have: Pain in your belly. Slight bruising. A little swelling. A small amount of blood from the cut from surgery. Follow these instructions at home: Medicines Take your medicines only as told by your health care provider. If told, take steps to prevent trouble pooping. You may need to: Drink enough fluid to keep your pee (urine) pale yellow. Take medicines to help you poop. Eat foods high in fiber. These include beans, whole grains, and fresh fruits and vegetables. Ask your provider if you should avoid driving or using machines while you're taking your medicine. If you were given antibiotics, take them as told by your provider. Do not stop taking them even if you start to feel better. Incision care  Take care of your cut from surgery as told by your provider. Make sure you: Wash your hands with soap and water for at least 20 seconds before and after you change your bandage. If you can't use soap and water, use hand sanitizer. Change your bandage. Leave stitches or skin glue in place for at least 2 weeks. Leave tape strips alone unless you're told to take them off. You may trim the edges of the tape strips if they curl up. Check the area around your cut every day for signs of infection. Check for: More redness, swelling, or pain. More fluid or blood. Warmth. Pus or a bad smell. Wear  loose clothes while your cut heals. Activity Rest as told. You may have to avoid lifting. Ask how much weight you can safely lift. Do not play contact sports until your provider says it's safe. Return to normal activities when you're told. Ask what things are safe for you to do. General instructions If you were given a sedative during your surgery, do not drive or use  machines until you're told it's safe. A sedative can make you sleepy. Do not take baths, swim, or use a hot tub until told. Ask if showers are okay. You may need to take sponge baths only. Hold a pillow over your belly when you cough or sneeze. This helps with pain. Do not smoke, vape, or use products with nicotine or tobacco in them. If you need help quitting, talk with your provider. Your provider may give you more instructions. Make sure you know what you can and can't do. Contact a health care provider if: You have signs of infection. You have a fever or chills. You have blood in your poop. You haven't pooped in 2-3 days. Your pain doesn't get better with medicine. Get help right away if: You have chest pain. You're short of breath. You feel faint or light-headed. You have very bad pain. You start to vomit. You have pain, swelling, or redness in a leg. These symptoms may be an emergency. Call 911 right away. Do not wait to see if the symptoms will go away. Do not drive yourself to the hospital. This information is not intended to replace advice given to you by your health care provider. Make sure you discuss any questions you have with your health care provider. Document Revised: 10/14/2022 Document Reviewed: 04/06/2022 Elsevier Patient Education  2024 [employer].

## 2023-03-09 NOTE — Interval H&P Note (Signed)
History and Physical Interval Note:  03/09/2023 11:40 AM  Paul Briggs [name]  has presented today for surgery, with the diagnosis of inguinal hernia non recurrent incarcerated.  The various methods of treatment have been discussed with the patient and family. After consideration of risks, benefits and other options for treatment, the patient has consented to  Procedure(s): HERNIA REPAIR INGUINAL ADULT, open, RNFA to assist (Right) as a surgical intervention.  The patient's history has been reviewed, patient examined, no change in status, stable for surgery.  I have reviewed the patient's chart and labs.  Questions were answered to the patient's satisfaction.     Areanna Gengler F Jozey Janco

## 2023-03-09 NOTE — Anesthesia Procedure Notes (Signed)
Procedure Name: Intubation Date/Time: 03/09/2023 12:18 PM  Performed by: Ginger Carne, CRNAPre-anesthesia Checklist: Patient identified, Emergency Drugs available, Suction available, Patient being monitored and Timeout performed Patient Re-evaluated:Patient Re-evaluated prior to induction Oxygen Delivery Method: Circle system utilized Preoxygenation: Pre-oxygenation with 100% oxygen Induction Type: IV induction and Rapid sequence Laryngoscope Size: McGrath and 3 Grade View: Grade II Tube type: Oral Tube size: 7.0 mm Number of attempts: 1 Airway Equipment and Method: Stylet and Video-laryngoscopy Placement Confirmation: ETT inserted through vocal cords under direct vision, positive ETCO2 and breath sounds checked- equal and bilateral Secured at: 22 cm Dental Injury: Teeth and Oropharynx as per pre-operative assessment  Difficulty Due To: Difficulty was anticipated, Difficult Airway- due to large tongue and Difficult Airway- due to dentition Future Recommendations: Recommend- induction with short-acting agent, and alternative techniques readily available

## 2023-03-09 NOTE — Progress Notes (Signed)
Patient awake/alert x4.  Up into recliner, patient states "just wanna sleep" Medicated for discomfort.  Spoke with anesthesia Dr. Karlton Lemon regarding sbp 170's  ok to give IV 5mg  hydrazaline.  Patient tolerating crackers and soda without event.

## 2023-03-09 NOTE — Op Note (Signed)
Open Right Inguinal Hernia Repair with Mesh (progrip 12x8 cms)      Pre-operative Diagnosis:  Recurrent Right Inguinal Hernia   Post-operative Diagnosis: Same   Surgeon: Sterling Big, MD FACS   Anesthesia: Gen. with endotracheal tube   Findings: Indirect inguinal hernia        Procedure Details  The patient was seen again in the Holding Room. The benefits, complications, treatment options, and expected outcomes were discussed with the patient. The risks of bleeding, infection, recurrence of symptoms, failure to resolve symptoms, recurrence of hernia, ischemic orchitis, chronic pain syndrome or neuroma, were discussed again. The likelihood of improving the patient's symptoms with return to their baseline status is good.  The patient and/or family concurred with the proposed plan, giving informed consent.  The patient was taken to Operating Room, identified  and the procedure verified as  Inguinal Hernia Repair. Laterality confirmed.  A Time Out was held and the above information confirmed.   Prior to the induction of general anesthesia, antibiotic prophylaxis was administered. VTE prophylaxis was in place. General endotracheal anesthesia was then administered and tolerated well. After the induction, the abdomen was prepped with Chloraprep and draped in the sterile fashion. The patient was positioned in the supine position.     Right inguinal incision was created with a 15 blade knife and electrocautery was used to dissect through the subcutaneous tissue.  The external bleak was dissected and incised.  Please note that the whole floor as well as the roof of the inguinal canal was distorted and very attenuated.  The external oblique was  incised all the way to the external inguinal ring.  The ilioinguinal nerve was identified and cauterized given that the high change of neuromas and postoperative pain. We were able to encircle the cord structures in the standard fashion.  Once again his anatomy had  significant distortion and the tissues were thin.  I was able to finally restore the  anatomy and identified a indirect hernia. I was Able to reduce the sac and ligated the sac and pushed it back into the abdominal cavity.   In order to reinforce the floor of the inguinal canal I was able to approximate the conjoined tendon to the shelving edge of the inguinal ligament.I used progrip Mesh perform Lichtenstein repair by fixing the mesh to the shelving edge of the inguinal ligament laterally and to the conjoined tendon medially.  Also make sure that the cord structures were protected at all times as well as the vas deferens. I was able to close the external oblique fascia with 2-0 Vicryl in a running fashion.  Using Exparel a field block was performed under direct visualization.  The subcutaneous tissue was approximated using 3-0 Vicryl 4-0 subcuticular Monocryl was used at all skin edges. Dermabond was placed.  Patient tolerated the procedure well. There were no complications. He was taken to the recovery room in stable condition.

## 2023-03-09 NOTE — Transfer of Care (Signed)
Immediate Anesthesia Transfer of Care Note  Patient: Paul Briggs.  Procedure(s) Performed: HERNIA REPAIR INGUINAL ADULT, open, RNFA to assist (Right: Abdomen)  Patient Location: PACU  Anesthesia Type:General  Level of Consciousness: awake and drowsy  Airway & Oxygen Therapy: Patient Spontanous Breathing and Patient connected to face mask oxygen  Post-op Assessment: Report given to RN and Post -op Vital signs reviewed and stable  Post vital signs: Reviewed and stable  Last Vitals:  Vitals Value Taken Time  BP 177/96 03/09/23 1340  Temp    Pulse 73 03/09/23 1340  Resp 18 03/09/23 1340  SpO2 100 % 03/09/23 1340  Vitals shown include unfiled device data.  Last Pain:  Vitals:   03/09/23 1123  TempSrc: Oral  PainSc: 3          Complications:  Encounter Notable Events  Notable Event Outcome Phase Comment  Difficult to intubate - expected  Intraprocedure Filed from anesthesia note documentation.

## 2023-03-10 ENCOUNTER — Encounter
Payer: No Typology Code available for payment source | Admitting: Student in an Organized Health Care Education/Training Program

## 2023-03-10 ENCOUNTER — Encounter: Payer: Self-pay | Admitting: Surgery

## 2023-03-11 ENCOUNTER — Inpatient Hospital Stay: Payer: No Typology Code available for payment source

## 2023-03-11 VITALS — BP 154/78 | HR 70 | Temp 98.7°F | Resp 20

## 2023-03-11 DIAGNOSIS — D509 Iron deficiency anemia, unspecified: Secondary | ICD-10-CM | POA: Diagnosis not present

## 2023-03-11 DIAGNOSIS — C9110 Chronic lymphocytic leukemia of B-cell type not having achieved remission: Secondary | ICD-10-CM

## 2023-03-11 LAB — SURGICAL PATHOLOGY

## 2023-03-11 MED ORDER — IRON SUCROSE 20 MG/ML IV SOLN
200.0000 mg | Freq: Once | INTRAVENOUS | Status: AC
  Administered 2023-03-11: 200 mg via INTRAVENOUS
  Filled 2023-03-11: qty 10

## 2023-03-11 MED ORDER — SODIUM CHLORIDE 0.9% FLUSH
10.0000 mL | Freq: Once | INTRAVENOUS | Status: AC | PRN
  Administered 2023-03-11: 10 mL
  Filled 2023-03-11: qty 10

## 2023-03-11 NOTE — Progress Notes (Signed)
Patient declined to wait the 30 minutes for post iron infusion observation today. Tolerated infusion well. VSS.

## 2023-03-14 ENCOUNTER — Other Ambulatory Visit: Payer: Self-pay | Admitting: Oncology

## 2023-03-14 ENCOUNTER — Other Ambulatory Visit: Payer: Self-pay

## 2023-03-14 DIAGNOSIS — C9110 Chronic lymphocytic leukemia of B-cell type not having achieved remission: Secondary | ICD-10-CM

## 2023-03-14 NOTE — Progress Notes (Signed)
 Specialty Pharmacy Refill Coordination Note  Paul Briggs. is a 78 y.o. male contacted today regarding refills of specialty medication(s) Zanubrutinib (Brukinsa)   Patient requested No data recorded  Delivery date: 03/16/23   Verified address: [address]   Medication will be filled on 03/16/23, pending refill approval.

## 2023-03-15 NOTE — Anesthesia Postprocedure Evaluation (Signed)
 Anesthesia Post Note  Patient: Paul Mccord [name]  Procedure(s) Performed: HERNIA REPAIR INGUINAL ADULT, open, RNFA to assist (Right: Abdomen)  Patient location during evaluation: PACU Anesthesia Type: General Level of consciousness: awake and alert Pain management: pain level controlled Vital Signs Assessment: post-procedure vital signs reviewed and stable Respiratory status: spontaneous breathing, nonlabored ventilation, respiratory function stable and patient connected to nasal cannula oxygen Cardiovascular status: blood pressure returned to baseline and stable Postop Assessment: no apparent nausea or vomiting Anesthetic complications: yes   Encounter Notable Events  Notable Event Outcome Phase Comment  Difficult to intubate - expected  Intraprocedure Filed from anesthesia note documentation.     Last Vitals:  Vitals:   03/09/23 1456 03/09/23 1536  BP: (!) 146/84 (!) 144/80  Pulse: 67 68  Resp: 16 16  Temp:  36.4 C  SpO2: 98% 99%    Last Pain:  Vitals:   03/09/23 1536  TempSrc: Temporal  PainSc:                  Lenard Simmer

## 2023-03-16 ENCOUNTER — Inpatient Hospital Stay: Payer: No Typology Code available for payment source

## 2023-03-16 ENCOUNTER — Other Ambulatory Visit (HOSPITAL_COMMUNITY): Payer: Self-pay

## 2023-03-16 ENCOUNTER — Other Ambulatory Visit: Payer: Self-pay | Admitting: Oncology

## 2023-03-16 VITALS — BP 153/86 | HR 65 | Temp 96.6°F | Resp 18

## 2023-03-16 DIAGNOSIS — C9110 Chronic lymphocytic leukemia of B-cell type not having achieved remission: Secondary | ICD-10-CM

## 2023-03-16 DIAGNOSIS — D509 Iron deficiency anemia, unspecified: Secondary | ICD-10-CM | POA: Diagnosis not present

## 2023-03-16 MED ORDER — SODIUM CHLORIDE 0.9% FLUSH
10.0000 mL | Freq: Once | INTRAVENOUS | Status: AC | PRN
  Administered 2023-03-16: 10 mL
  Filled 2023-03-16: qty 10

## 2023-03-16 MED ORDER — IRON SUCROSE 20 MG/ML IV SOLN
200.0000 mg | Freq: Once | INTRAVENOUS | Status: AC
  Administered 2023-03-16: 200 mg via INTRAVENOUS

## 2023-03-16 NOTE — Progress Notes (Signed)
 03/16/23-[place]: Brukinsa  Ship 02/19 or when approved. Left voicemail regarding delay.

## 2023-03-17 ENCOUNTER — Encounter: Payer: PRIVATE HEALTH INSURANCE | Admitting: Student in an Organized Health Care Education/Training Program

## 2023-03-17 ENCOUNTER — Encounter: Payer: Self-pay | Admitting: Student in an Organized Health Care Education/Training Program

## 2023-03-17 ENCOUNTER — Other Ambulatory Visit: Payer: Self-pay

## 2023-03-17 ENCOUNTER — Other Ambulatory Visit (HOSPITAL_COMMUNITY): Payer: Self-pay

## 2023-03-17 ENCOUNTER — Ambulatory Visit
Payer: No Typology Code available for payment source | Attending: Student in an Organized Health Care Education/Training Program | Admitting: Student in an Organized Health Care Education/Training Program

## 2023-03-17 VITALS — HR 70 | Temp 97.3°F | Resp 16 | Ht 73.0 in | Wt 225.0 lb

## 2023-03-17 DIAGNOSIS — G894 Chronic pain syndrome: Secondary | ICD-10-CM | POA: Diagnosis present

## 2023-03-17 DIAGNOSIS — M48062 Spinal stenosis, lumbar region with neurogenic claudication: Secondary | ICD-10-CM | POA: Diagnosis not present

## 2023-03-17 DIAGNOSIS — M5412 Radiculopathy, cervical region: Secondary | ICD-10-CM | POA: Diagnosis present

## 2023-03-17 DIAGNOSIS — G8929 Other chronic pain: Secondary | ICD-10-CM | POA: Diagnosis present

## 2023-03-17 DIAGNOSIS — M542 Cervicalgia: Secondary | ICD-10-CM | POA: Insufficient documentation

## 2023-03-17 DIAGNOSIS — M5416 Radiculopathy, lumbar region: Secondary | ICD-10-CM | POA: Insufficient documentation

## 2023-03-17 MED ORDER — OXYCODONE-ACETAMINOPHEN 10-325 MG PO TABS: 1.0000 | ORAL_TABLET | Freq: Three times a day (TID) | ORAL | 0 refills | Status: DC | PRN

## 2023-03-17 MED ORDER — OXYCODONE-ACETAMINOPHEN 10-325 MG PO TABS: 1.0000 | ORAL_TABLET | Freq: Three times a day (TID) | ORAL | 0 refills | Status: AC | PRN

## 2023-03-17 MED ORDER — BRUKINSA 80 MG PO CAPS
160.0000 mg | ORAL_CAPSULE | Freq: Two times a day (BID) | ORAL | 2 refills | Status: DC
  Filled 2023-03-17: qty 120, 30d supply, fill #0
  Filled 2023-05-02: qty 120, 30d supply, fill #1
  Filled 2023-08-25 – 2023-09-27 (×3): qty 120, 30d supply, fill #2

## 2023-03-17 NOTE — Progress Notes (Signed)
 PROVIDER NOTE: Information contained herein reflects review and annotations entered in association with encounter. Interpretation of such information and data should be left to medically-trained personnel. Information provided to patient can be located elsewhere in the medical record under "Patient Instructions". Document created using STT-dictation technology, any transcriptional errors that may result from process are unintentional.    Patient: Paul Ana [name]  Service Category: E/M  Provider: Edward Jolly, MD  DOB: [birth year]  DOS: 03/17/2023  Referring Provider: Wilford Corner  MRN: [id number]  Specialty: Interventional Pain Management  PCP: Wilford Corner, PA-C  Type: Established Patient  Setting: Ambulatory outpatient    Location: Office  Delivery: Face-to-face     HPI  [name], a 79 y.o. year old male, is here today because of his Cervical radicular pain [M54.12]. Paul Briggs primary complain today is Back Pain, Leg Pain, and Shoulder Pain (Bilateral )  Pertinent problems: Paul Briggs has Chronic back pain; Spinal stenosis, lumbar region, with neurogenic claudication; Chronic left shoulder pain; Chronic pain syndrome; and Pain management contract signed on their pertinent problem list. Pain Assessment: Severity of Chronic pain is reported as a 7 /10. Location: Back Lower, Right, Left/Radaites down front and back of legs bilateral down to feet bilateral to toes. Onset: More than a month ago. Quality: Aching, Constant. Timing: Constant. Modifying factor(s): Pain Medication. Vitals:  height is 6\' 1"  (1.854 m) and weight is 225 lb (102.1 kg). His temporal temperature is 97.3 F (36.3 C) (abnormal). His pulse is 70. His respiration is 16 and oxygen saturation is 100%.  BMI: Estimated body mass index is 29.69 kg/m as calculated from the following:   Height as of this encounter: 6\' 1"  (1.854 m).   Weight as of this encounter: 225 lb (102.1  kg). Last encounter: 12/21/2022. Last procedure: 06/16/2022.  Reason for encounter: medication management.  Discussed the use of AI scribe software for clinical note transcription with the patient, who gave verbal consent to proceed.  History of Present Illness   Paul Doberstein [name] is a 78 year old male who presents for pain management following recent hernia surgeries.   He also had a recent hernia repair and is experiencing swelling in the area. He continues to apply ice to manage the swelling and is unsure about the follow-up plan. He will contact his surgical team.  He fell on ice and injured his shoulder, for which he received an injection at another clinic. However, the injection did not provide significant relief, and he continues to experience shoulder pain.  He has run out of his pain medication, having taken the last dose the previous night. He has been taking more medication than usual due to the recent surgeries.       Pharmacotherapy Assessment  Analgesic: Percocet 10 mg 3 times daily as needed, quantity 90/month   Monitoring: [place] PMP: PDMP reviewed during this encounter.       Pharmacotherapy: No side-effects or adverse reactions reported. Compliance: No problems identified. Effectiveness: Clinically acceptable.  Earlyne Iba, RN  03/17/2023 11:15 AM  Sign when Signing Visit Nursing Pain Medication Assessment:  Safety precautions to be maintained throughout the outpatient stay will include: orient to surroundings, keep bed in low position, maintain call bell within reach at all times, provide assistance with transfer out of bed and ambulation.  Medication Inspection Compliance: Pill count conducted under aseptic conditions, in front of the patient. Neither the pills nor the bottle was removed from the patient's sight  at any time. Once count was completed pills were immediately returned to the patient in their original bottle.  Medication:  Oxycodone/APAP  Pill/Patch Count:  0 of 90 pills remain Pill/Patch Appearance: Markings consistent with prescribed medication Bottle Appearance: Standard pharmacy container. Clearly labeled. Filled Date: 01 / 27 / 2025 Last Medication intake:  Yesterday  No results found for: "CBDTHCR" No results found for: "D8THCCBX" No results found for: "D9THCCBX"  UDS:  Summary  Date Value Ref Range Status  12/21/2022 FINAL  Final    Comment:    ==================================================================== [employer] Select 13 (MW) ==================================================================== Test                             Result       Flag       Units  Drug Present and Declared for Prescription Verification   Oxycodone                      555          EXPECTED   ng/mg creat   Oxymorphone                    308          EXPECTED   ng/mg creat   Noroxycodone                   1967         EXPECTED   ng/mg creat   Noroxymorphone                 113          EXPECTED   ng/mg creat    Sources of oxycodone are scheduled prescription medications.    Oxymorphone, noroxycodone, and noroxymorphone are expected    metabolites of oxycodone. Oxymorphone is also available as a    scheduled prescription medication.  ==================================================================== Test                      Result    Flag   Units      Ref Range   Creatinine              119              mg/dL      >=16 ==================================================================== Declared Medications:  The flagging and interpretation on this report are based on the  following declared medications.  Unexpected results may arise from  inaccuracies in the declared medications.   **Note: The testing scope of this panel includes these medications:   Oxycodone (Percocet)   **Note: The testing scope of this panel does not include the  following reported medications:   Acetaminophen (Percocet)   Aspirin  Dapagliflozin (Farxiga)  Doxycycline  Hydralazine (Apresoline)  Levothyroxine (Synthroid)  Metformin (Glucophage)  Methocarbamol (Robaxin)  Metoprolol (Toprol)  Ondansetron (Zofran)  Potassium (Klor-Con)  Pregabalin (Lyrica)  Rosuvastatin (Crestor)  Sacubitril (Entresto)  Sildenafil (Viagra)  Tamsulosin (Flomax)  Valsartan (Entresto)  Vitamin B12  Zanubrutinib (Brukinsa) ==================================================================== For clinical consultation, please call [phone]. ====================================================================       ROS  Constitutional: Denies any fever or chills Gastrointestinal: No reported hemesis, hematochezia, vomiting, or acute GI distress Musculoskeletal:  right shoulder, low back pain Neurological: No reported episodes of acute onset apraxia, aphasia, dysarthria, agnosia, amnesia, paralysis, loss of coordination, or loss of consciousness  Medication Review  aspirin EC, calcium-vitamin D,  cyanocobalamin, hydrALAZINE, levothyroxine, metFORMIN, methocarbamol, metoprolol succinate, oxyCODONE-acetaminophen, rosuvastatin, and zanubrutinib  History Review  Allergy: Paul Briggs has no known allergies. Drug: Paul Briggs  reports no history of drug use. Alcohol:  reports no history of alcohol use. Tobacco:  reports that he has quit smoking. His smoking use included cigarettes. He has been exposed to tobacco smoke. He has never used smokeless tobacco. Social: Paul Briggs  reports that he has quit smoking. His smoking use included cigarettes. He has been exposed to tobacco smoke. He has never used smokeless tobacco. He reports that he does not drink alcohol and does not use drugs. 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), DDD (degenerative disc disease), cervical, Difficult airway (10/22/2015), Diverticulosis, DM (diabetes mellitus), type 2 (HCC), Dysplasia of prostate, Erectile dysfunction, Frequent falls, Heart failure with mildly reduced ejection fraction (HFmrEF) (HCC), 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 Surgical: Mr. Farney  has a past surgical history that includes Back surgery; Tonsillectomy; Esophagogastroduodenoscopy (egd) with propofol (N/A, 08/21/2022); biopsy (08/21/2022); Colonoscopy with propofol (N/A, 08/23/2022); Givens capsule study (N/A, 08/23/2022); polypectomy (08/23/2022); Submucosal tattoo injection (08/23/2022); biopsy (08/23/2022); Irrigation and debridement abscess (Left, 08/28/2022); Ines Bloomer hole of cranium (Left, 10/22/2015); Laparoscopic right colectomy (Right, 09/28/2022); Ventral hernia repair (N/A, 09/28/2022); Colon surgery (09/28/2022); Colonoscopy with propofol (N/A, 02/28/2023); Esophagogastroduodenoscopy (egd) with propofol (N/A, 02/28/2023); biopsy (02/28/2023); and Inguinal hernia repair (Right, 03/09/2023). Family: family history includes ADD / ADHD in his daughter; Asthma in his son; Cancer in his maternal aunt.  Laboratory Chemistry  Profile   Renal Lab Results  Component Value Date   BUN 18 02/18/2023   CREATININE 0.89 02/18/2023   BCR 11 12/22/2022   GFRAA >60 10/22/2015   GFRNONAA >60 02/18/2023    Hepatic Lab Results  Component Value Date   AST 38 02/18/2023   ALT 31 02/18/2023   ALBUMIN 3.8 02/18/2023   ALKPHOS 84 02/18/2023   HCVAB NON REACTIVE 12/26/2021   LIPASE 35 11/12/2022    Electrolytes Lab Results  Component Value Date   NA 137 02/18/2023   K 4.2 02/18/2023   CL 103 02/18/2023   CALCIUM 8.5 (L) 02/18/2023   MG 2.1 10/06/2022   PHOS 2.7 10/06/2022    Bone Lab Results  Component Value Date   VD25OH 30.27 05/21/2022   TESTOSTERONE 368 11/24/2022    Inflammation (CRP: Acute Phase) (ESR: Chronic Phase) Lab Results  Component Value Date   CRP 22.2 (H) 12/25/2021   ESRSEDRATE 3 12/25/2021   LATICACIDVEN 1.0 11/12/2022         Note: Above Lab results reviewed.  Recent Imaging Review  CT Cervical Spine Wo Contrast CLINICAL DATA:  Polytrauma, blunt  EXAM: CT CERVICAL SPINE WITHOUT CONTRAST  TECHNIQUE: Multidetector CT imaging of the cervical spine was performed without intravenous contrast. Multiplanar CT image reconstructions  were also generated.  RADIATION DOSE REDUCTION: This exam was performed according to the departmental dose-optimization program which includes automated exposure control, adjustment of the mA and/or kV according to patient size and/or use of iterative reconstruction technique.  COMPARISON:  08/26/2022  FINDINGS: Alignment: Facet joints are aligned without dislocation or traumatic listhesis. Dens and lateral masses are aligned. Smooth reversal of the cervical lordosis.  Skull base and vertebrae: No acute fracture. No primary bone lesion or focal pathologic process.  Soft tissues and spinal canal: No prevertebral fluid or swelling. No visible canal hematoma.  Disc levels: Advanced multilevel cervical spondylosis, similar to prior.  Upper  chest: Negative.  Other: Bilateral carotid atherosclerosis.  IMPRESSION: 1. No acute fracture or traumatic listhesis of the cervical spine. 2. Advanced multilevel cervical spondylosis.  Electronically Signed   By: Duanne Guess D.O.   On: 02/13/2023 15:08 CT Head Wo Contrast CLINICAL DATA:  Head trauma, minor (Age >= 65y)  EXAM: CT HEAD WITHOUT CONTRAST  TECHNIQUE: Contiguous axial images were obtained from the base of the skull through the vertex without intravenous contrast.  RADIATION DOSE REDUCTION: This exam was performed according to the departmental dose-optimization program which includes automated exposure control, adjustment of the mA and/or kV according to patient size and/or use of iterative reconstruction technique.  COMPARISON:  08/26/2022  FINDINGS: Brain: No evidence of acute infarction, hemorrhage, hydrocephalus, extra-axial collection or mass lesion/mass effect.  Vascular: No hyperdense vessel or unexpected calcification.  Skull: Left frontal burr hole.  No acute fracture.  Sinuses/Orbits: Air-fluid level in the left maxillary sinus.  Other: Negative for scalp hematoma.  IMPRESSION: 1. No acute intracranial abnormality. 2. Air-fluid level in the left maxillary sinus. Correlate for acute sinusitis.  Electronically Signed   By: Duanne Guess D.O.   On: 02/13/2023 15:03 DG Lumbar Spine 2-3 Views CLINICAL DATA:  Pain after fall  EXAM: LUMBAR SPINE - 2-3 VIEW  COMPARISON:  06/02/2022  FINDINGS: There is no evidence of lumbar spine fracture. Grade 1 anterolisthesis of L4 on L5. Mild retrolisthesis at L5-S1. Multilevel disc height loss. Lower lumbar facet arthropathy. Aortic atherosclerosis.  IMPRESSION: 1. No acute fracture or traumatic listhesis of the lumbar spine. 2. Multilevel degenerative changes.  Electronically Signed   By: Duanne Guess D.O.   On: 02/13/2023 15:01 DG Shoulder Right CLINICAL DATA:  Shoulder pain after  fall  EXAM: RIGHT SHOULDER - 2+ VIEW  COMPARISON:  04/04/2017  FINDINGS: No acute fracture or dislocation. High-riding humeral head suggestive of underlying rotator cuff insufficiency. Moderate degenerative changes of the shoulder, progressed since 2019. No focal soft tissue abnormality.  IMPRESSION: 1. No acute fracture or dislocation of the right shoulder. 2. Moderate degenerative changes of the shoulder, progressed since 2019.  Electronically Signed   By: Duanne Guess D.O.   On: 02/13/2023 14:59 Note: Reviewed        Physical Exam  General appearance: Well nourished, well developed, and well hydrated. In no apparent acute distress Mental status: Alert, oriented x 3 (person, place, & time)       Respiratory: No evidence of acute respiratory distress Eyes: PERLA Vitals: Pulse 70   Temp (!) 97.3 F (36.3 C) (Temporal)   Resp 16   Ht 6\' 1"  (1.854 m)   Wt 225 lb (102.1 kg)   SpO2 100%   BMI 29.69 kg/m  BMI: Estimated body mass index is 29.69 kg/m as calculated from the following:   Height as of this encounter: 6\' 1"  (1.854 m).  Weight as of this encounter: 225 lb (102.1 kg). Ideal: Ideal body weight: 79.9 kg (176 lb 2.4 oz) Adjusted ideal body weight: 88.8 kg (195 lb 11 oz)  Right shoulder pain, worse with shoulder abduction  Low back pain  Assessment   Diagnosis Status  1. Cervical radicular pain   2. Cervicalgia   3. Lumbar radicular pain   4. Spinal stenosis, lumbar region, with neurogenic claudication   5. Chronic radicular lumbar pain   6. Chronic pain syndrome    Controlled Controlled Controlled   Updated Problems: No problems updated.  Plan of Care  Problem-specific:  Assessment and Plan    Chronic pain Experiencing postoperative pain and swelling after hernia repair surgery last Wednesday, managed with ice. Pain medication refill is necessary due to an existing pain management contract. Discussed the risks of unmanaged postoperative  pain, such as prolonged recovery and complications, and the benefits of effective pain management, including improved comfort and faster recovery. Alternatives include non-pharmacological strategies. Plan to refill pain medication and discuss pain management with the surgical team.  Shoulder Pain Shoulder pain persists following a fall on ice. A previous injection at another clinic provided minimal relief. Discussed the potential need for further imaging or alternative treatments if pain continues. Further evaluation could lead to an accurate diagnosis and targeted treatment, though there is a risk of delayed pain relief. Plan to consult orthopedics regarding shoulder injection and further management.       Mr. Kaston Faughn [name] has a current medication list which includes the following long-term medication(s): calcium-vitamin d, hydralazine, levothyroxine, metformin, metoprolol succinate, and rosuvastatin.  Pharmacotherapy (Medications Ordered): Meds ordered this encounter  Medications   oxyCODONE-acetaminophen (PERCOCET) 10-325 MG tablet    Sig: Take 1 tablet by mouth every 8 (eight) hours as needed for pain. Must last 30 days.    Dispense:  90 tablet    Refill:  0    Chronic Pain: STOP Act (Not applicable) Fill 1 day early if closed on refill date. Avoid benzodiazepines within 8 hours of opioids   oxyCODONE-acetaminophen (PERCOCET) 10-325 MG tablet    Sig: Take 1 tablet by mouth every 8 (eight) hours as needed for pain. Must last 30 days.    Dispense:  90 tablet    Refill:  0    Chronic Pain: STOP Act (Not applicable) Fill 1 day early if closed on refill date. Avoid benzodiazepines within 8 hours of opioids   oxyCODONE-acetaminophen (PERCOCET) 10-325 MG tablet    Sig: Take 1 tablet by mouth every 8 (eight) hours as needed for pain. Must last 30 days.    Dispense:  90 tablet    Refill:  0    Chronic Pain: STOP Act (Not applicable) Fill 1 day early if closed on refill date. Avoid  benzodiazepines within 8 hours of opioids   Orders:  No orders of the defined types were placed in this encounter.  Follow-up plan:   Return in about 3 months (around 06/14/2023) for MM, F2F, add to NP list.      Left shoulder injection 11/10/20, 12/24/20. L5/S1 ESI 04/15/21, 06/15/21, Left C7/T1 ESI 06/16/22          Recent Visits Date Type Provider Dept  12/21/22 Office Visit Edward Jolly, MD [facility]  Showing recent visits within past 90 days and meeting all other requirements Today's Visits Date Type Provider Dept  03/17/23 Office Visit Edward Jolly, MD [facility]  Showing today's visits and meeting all other  requirements Future Appointments Date Type Provider Dept  06/09/23 Appointment Edward Jolly, MD [facility]  Showing future appointments within next 90 days and meeting all other requirements  I discussed the assessment and treatment plan with the patient. The patient was provided an opportunity to ask questions and all were answered. The patient agreed with the plan and demonstrated an understanding of the instructions.  Patient advised to call back or seek an in-person evaluation if the symptoms or condition worsens.  Duration of encounter: .  Total time on encounter, as per AMA guidelines included both the face-to-face and non-face-to-face time personally spent by the physician and/or other qualified health care professional(s) on the day of the encounter (includes time in activities that require the physician or other qualified health care professional and does not include time in activities normally performed by clinical staff). Physician's time may include the following activities when performed: Preparing to see the patient (e.g., pre-charting review of records, searching for previously ordered imaging, lab work, and nerve conduction tests) Review of prior analgesic pharmacotherapies. Reviewing PMP Interpreting ordered  tests (e.g., lab work, imaging, nerve conduction tests) Performing post-procedure evaluations, including interpretation of diagnostic procedures Obtaining and/or reviewing separately obtained history Performing a medically appropriate examination and/or evaluation Counseling and educating the patient/family/caregiver Ordering medications, tests, or procedures Referring and communicating with other health care professionals (when not separately reported) Documenting clinical information in the electronic or other health record Independently interpreting results (not separately reported) and communicating results to the patient/ family/caregiver Care coordination (not separately reported)  Note by: Edward Jolly, MD Date: 03/17/2023; Time: 11:47 AM

## 2023-03-17 NOTE — Patient Instructions (Signed)
Medication Rules  Applies to: All patients receiving prescriptions (written or electronic).  Pharmacy of record: Pharmacy where electronic prescriptions will be sent. If written prescriptions are taken to a different pharmacy, please inform the nursing staff. The pharmacy listed in the electronic medical record should be the one where you would like electronic prescriptions to be sent.  Prescription refills: Only during scheduled appointments. Applies to both, written and electronic prescriptions.  NOTE: The following applies primarily to controlled substances (Opioid* Pain Medications).   Patient's responsibilities: Pain Pills: Bring all pain pills to every appointment (except for procedure appointments). Pill Bottles: Bring pills in original pharmacy bottle. Always bring newest bottle. Bring bottle, even if empty. Medication refills: You are responsible for knowing and keeping track of what medications you need refilled. The day before your appointment, write a list of all prescriptions that need to be refilled. Bring that list to your appointment and give it to the admitting nurse. Prescriptions will be written only during appointments. If you forget a medication, it will not be "Called in", "Faxed", or "electronically sent". You will need to get another appointment to get these prescribed. Prescription Accuracy: You are responsible for carefully inspecting your prescriptions before leaving our office. Have the discharge nurse carefully go over each prescription with you, before taking them home. Make sure that your name is accurately spelled, that your address is correct. Check the name and dose of your medication to make sure it is accurate. Check the number of pills, and the written instructions to make sure they are clear and accurate. Make sure that you are given enough medication to last until your next medication refill appointment. Taking Medication: Take medication as prescribed. Never  take more pills than instructed. Never take medication more frequently than prescribed. Taking less pills or less frequently is permitted and encouraged, when it comes to controlled substances (written prescriptions).  Inform other Doctors: Always inform, all of your healthcare providers, of all the medications you take. Pain Medication from other Providers: You are not allowed to accept any additional pain medication from any other Doctor or Healthcare provider. There are two exceptions to this rule. (see below) In the event that you require additional pain medication, you are responsible for notifying us, as stated below. Medication Agreement: You are responsible for carefully reading and following our Medication Agreement. This must be signed before receiving any prescriptions from our practice. Safely store a copy of your signed Agreement. Violations to the Agreement will result in no further prescriptions. (Additional copies of our Medication Agreement are available upon request.) Laws, Rules, & Regulations: All patients are expected to follow all Federal and State Laws, Statutes, Rules, & Regulations. Ignorance of the Laws does not constitute a valid excuse. The use of any illegal substances is prohibited. Adopted CDC guidelines & recommendations: Target dosing levels will be at or below 60 MME/day. Use of benzodiazepines** is not recommended.  Exceptions: There are only two exceptions to the rule of not receiving pain medications from other Healthcare Providers. Exception #1 (Emergencies): In the event of an emergency (i.e.: accident requiring emergency care), you are allowed to receive additional pain medication. However, you are responsible for: As soon as you are able, call our office [phone], at any time of the day or night, and leave a message stating your name, the date and nature of the emergency, and the name and dose of the medication prescribed. In the event that your call is answered  by a member of   our staff, make sure to document and save the date, time, and the name of the person that took your information.  Exception #2 (Planned Surgery): In the event that you are scheduled by another doctor or dentist to have any type of surgery or procedure, you are allowed (for a period no longer than 30 days), to receive additional pain medication, for the acute post-op pain. However, in this case, you are responsible for picking up a copy of our "Post-op Pain Management for Surgeons" handout, and giving it to your surgeon or dentist. This document is available at our office, and does not require an appointment to obtain it. Simply go to our office during business hours (Monday-Thursday from 8:00 AM to 4:00 PM) (Friday 8:00 AM to 12:00 Noon) or if you have a scheduled appointment with us, prior to your surgery, and ask for it by name. In addition, you will need to provide us with your name, name of your surgeon, type of surgery, and date of procedure or surgery.  *Opioid medications include: morphine, codeine, oxycodone, oxymorphone, hydrocodone, hydromorphone, meperidine, tramadol, tapentadol, buprenorphine, fentanyl, methadone. **Benzodiazepine medications include: diazepam (Valium), alprazolam (Xanax), clonazepam (Klonopine), lorazepam (Ativan), clorazepate (Tranxene), chlordiazepoxide (Librium), estazolam (Prosom), oxazepam (Serax), temazepam (Restoril), triazolam (Halcion) (Last updated: 03/24/2017)  

## 2023-03-17 NOTE — Progress Notes (Signed)
 Nursing Pain Medication Assessment:  Safety precautions to be maintained throughout the outpatient stay will include: orient to surroundings, keep bed in low position, maintain call bell within reach at all times, provide assistance with transfer out of bed and ambulation.  Medication Inspection Compliance: Pill count conducted under aseptic conditions, in front of the patient. Neither the pills nor the bottle was removed from the patient's sight at any time. Once count was completed pills were immediately returned to the patient in their original bottle.  Medication: Oxycodone/APAP  Pill/Patch Count:  0 of 90 pills remain Pill/Patch Appearance: Markings consistent with prescribed medication Bottle Appearance: Standard pharmacy container. Clearly labeled. Filled Date: 01 / 27 / 2025 Last Medication intake:  Yesterday

## 2023-03-23 ENCOUNTER — Ambulatory Visit: Payer: PRIVATE HEALTH INSURANCE | Admitting: Oncology

## 2023-03-23 ENCOUNTER — Other Ambulatory Visit: Payer: PRIVATE HEALTH INSURANCE

## 2023-03-24 ENCOUNTER — Other Ambulatory Visit: Payer: PRIVATE HEALTH INSURANCE

## 2023-03-24 ENCOUNTER — Ambulatory Visit: Payer: PRIVATE HEALTH INSURANCE | Admitting: Oncology

## 2023-03-24 ENCOUNTER — Encounter: Payer: No Typology Code available for payment source | Admitting: Family Medicine

## 2023-03-28 ENCOUNTER — Ambulatory Visit (INDEPENDENT_AMBULATORY_CARE_PROVIDER_SITE_OTHER): Payer: No Typology Code available for payment source | Admitting: Surgery

## 2023-03-28 ENCOUNTER — Encounter (HOSPITAL_COMMUNITY): Payer: Self-pay

## 2023-03-28 ENCOUNTER — Encounter: Payer: Self-pay | Admitting: Surgery

## 2023-03-28 VITALS — BP 168/87 | HR 67 | Temp 98.6°F | Ht 73.0 in | Wt 225.0 lb

## 2023-03-28 DIAGNOSIS — Z09 Encounter for follow-up examination after completed treatment for conditions other than malignant neoplasm: Secondary | ICD-10-CM

## 2023-03-28 DIAGNOSIS — K4091 Unilateral inguinal hernia, without obstruction or gangrene, recurrent: Secondary | ICD-10-CM

## 2023-03-28 MED ORDER — AMOXICILLIN-POT CLAVULANATE 875-125 MG PO TABS: 1.0000 | ORAL_TABLET | Freq: Two times a day (BID) | ORAL | 0 refills | Status: AC

## 2023-03-28 NOTE — Patient Instructions (Signed)
 We are sending a prescription for antibiotics to your pharmacy, finish all of your pills.  Change your dressing at least once a day, more if needed.  Follow up here in 2 weeks. Do call with any questions or if the area is not getting better in 3-4 days.

## 2023-03-29 NOTE — Progress Notes (Signed)
 Outpatient Surgical Follow Up  03/29/2023  Paul Ana [name] is an 78 y.o. male.   Chief Complaint  Patient presents with   Routine Post Op    HPI: Paul Briggs is following up for open inguinal hernia repair on the right side.  Couple of days ago he started having some drainage from the wound.  No fevers no chills. He does have some expected pain.  Past Medical History:  Diagnosis Date   Adenoma of left adrenal gland    Anemia    Aortic atherosclerosis (HCC)    Blood transfusion without reported diagnosis    BPH (benign prostatic hyperplasia)    Bradycardia    CAD (coronary artery disease) 08/26/2022   a.) cCTA 08/26/2022: Ca2+ 14.3 (12th %'ile for age/sex.race match control); (<25%) pLAD   Cellulitis of left thigh 08/2022   Chronic pain syndrome    a.) on COT managed by pain management   Chronic, continuous use of opioids    a.) chronic pain syndrome/chronic back pain; managed by pain management   CKD (chronic kidney disease) stage 3, GFR 30-59 ml/min (HCC)    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   a.) Rai stage III   DDD (degenerative disc disease), cervical    Difficult airway 10/22/2015   a.) 1st attempt with glidescope  --> macroglossic (unsuccessful); b.) 2nd attempt with Mac 4 and direct laryngoscopy with a 8.0 ETT --> tube too large to pass through the cords; b.) 3rd attempt (successful) with a Mac 4 and a 7.5 tube   Diverticulosis    DM (diabetes mellitus), type 2 (HCC)    Dysplasia of prostate    Erectile dysfunction    Frequent falls    Heart failure with mildly reduced ejection fraction (HFmrEF) (HCC)    a.) TTE 10/23/2015: EF 60-65%, mod-sev LVH, mild biatrial dil, degen MV disease, AoV sclerosis, asc Ao 38 mm, G1DD; b.) TTE 08/05/2022: EF 45-50%, mod-sev LVH (speckled pattern), sev biatrial dil, mild MR, AoV sclerosis, G1DD   Hepatic flexure mass 08/23/2022   a.) colonoscopy 08/23/2022: 14 x 15 mm partially obstructing ulcerated distal  ascending colon mass; tissue friable --> pathology resulted as tubular adenoma, however felt to be false (-) --> referral to sugery and colectomy recommended; b.) s/p RIGHT hemicolectomy 09/28/2022   History of MRSA infection 08/28/2022   a.) MRSA PCR (+) 08/28/2022; culture from LEFT thigh abscess   HTN (hypertension)    Hyperlipidemia    Hypogonadism in male    Hypothyroidism    IDA (iron deficiency anemia)    LBBB (left bundle branch block)    Long-term use of aspirin therapy    Lumbar spinal stenosis    Mild cardiomegaly    Multiple lung nodules on CT    NICM (nonischemic cardiomyopathy) (HCC)    a.) TTE 10/23/2015: EF 60-65; b.) TTE 08/05/2022: EF 45-50%   Orchitis and epididymitis 06/16/2013   OSA on CPAP    Palindromic rheumatism, hand    Pericardial effusion    Prostatitis    Sepsis (HCC) 08/2022   Subdural hematoma (HCC) 10/22/2015   a.) s/p traumatic mechanical fall --> CT head 10/22/2015: high-density SDH the left cerebral convexity with13 mm midline shift --> s/p LEFT frontal burr hole craniotomy   Tubular adenoma of colon    Ventral hernia     Past Surgical History:  Procedure Laterality Date   BACK SURGERY     BIOPSY  08/21/2022   Procedure: BIOPSY;  Surgeon: Jaynie Collins, DO;  Location: [facility] ENDOSCOPY;  Service: Gastroenterology;;   BIOPSY  08/23/2022   Procedure: BIOPSY;  Surgeon: Jaynie Collins, DO;  Location: [facility] ENDOSCOPY;  Service: Gastroenterology;;   BIOPSY  02/28/2023   Procedure: BIOPSY;  Surgeon: Jaynie Collins, DO;  Location: [facility] ENDOSCOPY;  Service: Gastroenterology;;   Guss Bunde OF CRANIUM Left 10/22/2015   COLON SURGERY  09/28/2022   COLONOSCOPY WITH PROPOFOL N/A 08/23/2022   Procedure: COLONOSCOPY WITH PROPOFOL;  Surgeon: Jaynie Collins, DO;  Location: [facility] ENDOSCOPY;  Service: Gastroenterology;  Laterality: N/A;   COLONOSCOPY WITH PROPOFOL N/A 02/28/2023   Procedure: COLONOSCOPY WITH PROPOFOL;  Surgeon: Jaynie Collins, DO;  Location: [facility] ENDOSCOPY;  Service: Gastroenterology;  Laterality: N/A;  DM   ESOPHAGOGASTRODUODENOSCOPY (EGD) WITH PROPOFOL N/A 08/21/2022   Procedure: ESOPHAGOGASTRODUODENOSCOPY (EGD) WITH PROPOFOL;  Surgeon: Jaynie Collins, DO;  Location: [facility] ENDOSCOPY;  Service: Gastroenterology;  Laterality: N/A;   ESOPHAGOGASTRODUODENOSCOPY (EGD) WITH PROPOFOL N/A 02/28/2023   Procedure: ESOPHAGOGASTRODUODENOSCOPY (EGD) WITH PROPOFOL;  Surgeon: Jaynie Collins, DO;  Location: [facility] ENDOSCOPY;  Service: Gastroenterology;  Laterality: N/A;   GIVENS CAPSULE STUDY N/A 08/23/2022   Procedure: GIVENS CAPSULE STUDY;  Surgeon: Jaynie Collins, DO;  Location: [facility] ENDOSCOPY;  Service: Gastroenterology;  Laterality: N/A;   INGUINAL HERNIA REPAIR Right 03/09/2023   Procedure: HERNIA REPAIR INGUINAL ADULT, open, RNFA to assist;  Surgeon: Leafy Ro, MD;  Location: [facility] ORS;  Service: General;  Laterality: Right;   IRRIGATION AND DEBRIDEMENT ABSCESS Left 08/28/2022   Procedure: IRRIGATION AND DEBRIDEMENT ABSCESS LEFT UPPER THIGH/GROIN;  Surgeon: Leafy Ro, MD;  Location: [facility] ORS;  Service: General;  Laterality: Left;   LAPAROSCOPIC RIGHT COLECTOMY Right 09/28/2022   Procedure: LAPAROSCOPIC RIGHT COLECTOMY, RNFA to assist;  Surgeon: Leafy Ro, MD;  Location: [facility] ORS;  Service: General;  Laterality: Right;   POLYPECTOMY  08/23/2022   Procedure: POLYPECTOMY;  Surgeon: Jaynie Collins, DO;  Location: [facility] ENDOSCOPY;  Service: Gastroenterology;;   SUBMUCOSAL TATTOO INJECTION  08/23/2022   Procedure: SUBMUCOSAL TATTOO INJECTION;  Surgeon: Jaynie Collins, DO;  Location: [facility] ENDOSCOPY;  Service: Gastroenterology;;   TONSILLECTOMY     VENTRAL HERNIA REPAIR N/A 09/28/2022   Procedure: HERNIA REPAIR VENTRAL ADULT;  Surgeon: Leafy Ro, MD;  Location: [facility] ORS;  Service: General;  Laterality: N/A;    Family History  Problem Relation Age of Onset   Cancer Maternal  Aunt    ADD / ADHD Daughter    Asthma Son    Prostate cancer Neg Hx    Kidney disease Neg Hx    Kidney cancer Neg Hx    Bladder Cancer Neg Hx     Social History:  reports that he has quit smoking. His smoking use included cigarettes. He has been exposed to tobacco smoke. He has never used smokeless tobacco. He reports that he does not drink alcohol and does not use drugs.  Allergies: No Known Allergies  Medications reviewed.    ROS Full ROS performed and is otherwise negative other than what is stated in HPI   BP (!) 168/87   Pulse 67   Temp 98.6 F (37 C)   Ht 6\' 1"  (1.854 m)   Wt 225 lb (102.1 kg)   SpO2 97%   BMI 29.69 kg/m   Physical Exam NAD alert Abd: soft, patient healing well for the most part but there is some induration and on the medial portion there is a superficial dehiscence with some serous fluid.  No evidence of abscess no evidence of definitive infection.  Assessment/Plan: Superficial wound dehiscence on medial aspect.  Given his risk factors I will definitely want to start him on some antibiotics ( augmentin).  I will have him follow-up in the office in a couple weeks.  He does not need hospitalization he is not septic or toxic at this time.  Sterling Big, MD [facility] General Surgeon

## 2023-03-30 ENCOUNTER — Ambulatory Visit
Admission: RE | Admit: 2023-03-30 | Discharge: 2023-03-30 | Disposition: A | Discharge status: Home / Self Care | Payer: No Typology Code available for payment source | Source: Ambulatory Visit | Attending: Medical | Admitting: Medical

## 2023-03-30 ENCOUNTER — Other Ambulatory Visit: Payer: Self-pay | Admitting: Medical

## 2023-03-30 DIAGNOSIS — I361 Nonrheumatic tricuspid (valve) insufficiency: Secondary | ICD-10-CM

## 2023-03-30 DIAGNOSIS — R931 Abnormal findings on diagnostic imaging of heart and coronary circulation: Secondary | ICD-10-CM | POA: Insufficient documentation

## 2023-03-30 DIAGNOSIS — I340 Nonrheumatic mitral (valve) insufficiency: Secondary | ICD-10-CM

## 2023-03-30 MED ORDER — GADOBUTROL 1 MMOL/ML IV SOLN
13.0000 mL | Freq: Once | INTRAVENOUS | Status: AC | PRN
Start: 2023-03-30 — End: 2023-03-30
  Administered 2023-03-30: 13 mL via INTRAVENOUS

## 2023-03-31 ENCOUNTER — Other Ambulatory Visit: Payer: Self-pay

## 2023-04-08 ENCOUNTER — Other Ambulatory Visit: Payer: Self-pay

## 2023-04-08 DIAGNOSIS — C9110 Chronic lymphocytic leukemia of B-cell type not having achieved remission: Secondary | ICD-10-CM

## 2023-04-11 ENCOUNTER — Other Ambulatory Visit: Payer: Self-pay

## 2023-04-11 ENCOUNTER — Inpatient Hospital Stay: Payer: PRIVATE HEALTH INSURANCE | Attending: Oncology

## 2023-04-11 DIAGNOSIS — D509 Iron deficiency anemia, unspecified: Secondary | ICD-10-CM | POA: Insufficient documentation

## 2023-04-11 DIAGNOSIS — Z87891 Personal history of nicotine dependence: Secondary | ICD-10-CM | POA: Diagnosis not present

## 2023-04-11 DIAGNOSIS — D631 Anemia in chronic kidney disease: Secondary | ICD-10-CM | POA: Diagnosis not present

## 2023-04-11 DIAGNOSIS — N183 Chronic kidney disease, stage 3 unspecified: Secondary | ICD-10-CM | POA: Diagnosis not present

## 2023-04-11 DIAGNOSIS — C9110 Chronic lymphocytic leukemia of B-cell type not having achieved remission: Secondary | ICD-10-CM | POA: Insufficient documentation

## 2023-04-11 LAB — CBC WITH DIFFERENTIAL (CANCER CENTER ONLY)
Abs Immature Granulocytes: 0.17 10*3/uL — ABNORMAL HIGH (ref 0.00–0.07)
Basophils Absolute: 0.1 10*3/uL (ref 0.0–0.1)
Basophils Relative: 0 %
Eosinophils Absolute: 0.2 10*3/uL (ref 0.0–0.5)
Eosinophils Relative: 1 %
HCT: 27.8 % — ABNORMAL LOW (ref 39.0–52.0)
Hemoglobin: 8.5 g/dL — ABNORMAL LOW (ref 13.0–17.0)
Immature Granulocytes: 1 %
Lymphocytes Relative: 34 %
Lymphs Abs: 7.5 10*3/uL — ABNORMAL HIGH (ref 0.7–4.0)
MCH: 27.2 pg (ref 26.0–34.0)
MCHC: 30.6 g/dL (ref 30.0–36.0)
MCV: 88.8 fL (ref 80.0–100.0)
Monocytes Absolute: 1.1 10*3/uL — ABNORMAL HIGH (ref 0.1–1.0)
Monocytes Relative: 5 %
Neutro Abs: 13.2 10*3/uL — ABNORMAL HIGH (ref 1.7–7.7)
Neutrophils Relative %: 59 %
Platelet Count: 145 10*3/uL — ABNORMAL LOW (ref 150–400)
RBC: 3.13 MIL/uL — ABNORMAL LOW (ref 4.22–5.81)
RDW: 20.6 % — ABNORMAL HIGH (ref 11.5–15.5)
Smear Review: NORMAL
WBC Count: 22.2 10*3/uL — ABNORMAL HIGH (ref 4.0–10.5)
nRBC: 0 % (ref 0.0–0.2)

## 2023-04-11 LAB — CMP (CANCER CENTER ONLY)
ALT: 25 U/L (ref 0–44)
AST: 51 U/L — ABNORMAL HIGH (ref 15–41)
Albumin: 3.6 g/dL (ref 3.5–5.0)
Alkaline Phosphatase: 68 U/L (ref 38–126)
Anion gap: 11 (ref 5–15)
BUN: 14 mg/dL (ref 8–23)
CO2: 23 mmol/L (ref 22–32)
Calcium: 8.3 mg/dL — ABNORMAL LOW (ref 8.9–10.3)
Chloride: 103 mmol/L (ref 98–111)
Creatinine: 1.25 mg/dL — ABNORMAL HIGH (ref 0.61–1.24)
GFR, Estimated: 59 mL/min — ABNORMAL LOW (ref >60)
Glucose, Bld: 154 mg/dL — ABNORMAL HIGH (ref 70–99)
Potassium: 3.5 mmol/L (ref 3.5–5.1)
Sodium: 137 mmol/L (ref 135–145)
Total Bilirubin: 0.8 mg/dL (ref 0.0–1.2)
Total Protein: 6.4 g/dL — ABNORMAL LOW (ref 6.5–8.1)

## 2023-04-11 LAB — FERRITIN: Ferritin: 24 ng/mL (ref 24–336)

## 2023-04-11 LAB — IRON AND TIBC
Iron: 26 ug/dL — ABNORMAL LOW (ref 45–182)
Saturation Ratios: 8 % — ABNORMAL LOW (ref 17.9–39.5)
TIBC: 336 ug/dL (ref 250–450)
UIBC: 310 ug/dL

## 2023-04-11 LAB — RETIC PANEL
Immature Retic Fract: 26 % — ABNORMAL HIGH (ref 2.3–15.9)
RBC.: 3.14 MIL/uL — ABNORMAL LOW (ref 4.22–5.81)
Retic Count, Absolute: 46.8 10*3/uL (ref 19.0–186.0)
Retic Ct Pct: 1.5 % (ref 0.4–3.1)
Reticulocyte Hemoglobin: 28.3 pg (ref >27.9)

## 2023-04-12 ENCOUNTER — Encounter: Payer: Self-pay | Admitting: Gastroenterology

## 2023-04-12 DIAGNOSIS — E119 Type 2 diabetes mellitus without complications: Secondary | ICD-10-CM | POA: Diagnosis not present

## 2023-04-12 DIAGNOSIS — R053 Chronic cough: Secondary | ICD-10-CM | POA: Diagnosis not present

## 2023-04-12 DIAGNOSIS — N1831 Chronic kidney disease, stage 3a: Secondary | ICD-10-CM | POA: Diagnosis not present

## 2023-04-13 ENCOUNTER — Other Ambulatory Visit: Payer: Self-pay | Admitting: Family Medicine

## 2023-04-13 DIAGNOSIS — R053 Chronic cough: Secondary | ICD-10-CM

## 2023-04-14 ENCOUNTER — Other Ambulatory Visit: Payer: Self-pay

## 2023-04-14 DIAGNOSIS — I429 Cardiomyopathy, unspecified: Secondary | ICD-10-CM

## 2023-04-15 ENCOUNTER — Inpatient Hospital Stay: Payer: PRIVATE HEALTH INSURANCE

## 2023-04-15 ENCOUNTER — Inpatient Hospital Stay: Payer: PRIVATE HEALTH INSURANCE | Admitting: Oncology

## 2023-04-15 ENCOUNTER — Encounter: Payer: Self-pay | Admitting: Oncology

## 2023-04-15 VITALS — BP 150/80 | HR 67 | Temp 97.0°F | Resp 18 | Wt 227.8 lb

## 2023-04-15 DIAGNOSIS — D509 Iron deficiency anemia, unspecified: Secondary | ICD-10-CM | POA: Diagnosis not present

## 2023-04-15 DIAGNOSIS — C9110 Chronic lymphocytic leukemia of B-cell type not having achieved remission: Secondary | ICD-10-CM | POA: Diagnosis not present

## 2023-04-15 DIAGNOSIS — Z5111 Encounter for antineoplastic chemotherapy: Secondary | ICD-10-CM | POA: Diagnosis not present

## 2023-04-15 MED ORDER — IRON SUCROSE 20 MG/ML IV SOLN
200.0000 mg | Freq: Once | INTRAVENOUS | Status: AC
  Administered 2023-04-15: 200 mg via INTRAVENOUS

## 2023-04-15 NOTE — Assessment & Plan Note (Signed)
 Continue treatment as mentioned above.

## 2023-04-15 NOTE — Assessment & Plan Note (Addendum)
 Labs are reviewed and discussed with patient. CT chest abdomen pelvis results reviewed and discussed with patient.   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 continue Zanubrutinib 160 mg twice daily Leukocytosis is worse, unclear etiology.  ? Reactive, however no signs of infection, except chronic cough. CT pending.  Follow up closely.

## 2023-04-15 NOTE — Patient Instructions (Signed)

## 2023-04-15 NOTE — Progress Notes (Signed)
 Hematology/Oncology Progress note [phone] [phone]        ASSESSMENT & PLAN:   Cancer Staging  CLL (chronic lymphocytic leukemia) (HCC) Staging form: Chronic Lymphocytic Leukemia / Small Lymphocytic Lymphoma, AJCC 8th Edition - Clinical stage from 12/28/2021: Modified Rai Stage III (Modified Rai risk: High, Lymphocytosis: Present, Adenopathy: Present, Organomegaly: Absent, Anemia: Present, Thrombocytopenia: Absent) - Signed by [name], MD on 01/20/2022   CLL (chronic lymphocytic leukemia) [facility]) Labs are reviewed and discussed with patient. CT chest abdomen pelvis results reviewed and discussed with patient.   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 continue Zanubrutinib 160 mg twice daily Leukocytosis is worse, unclear etiology.  ? Reactive, however no signs of infection, except chronic cough. CT pending.  Follow up closely.     IDA (iron deficiency anemia) Anemia is likely multifactorial, could be due to CLL, anemia due to chronic kidney disease and IDA. Lab Results  Component Value Date   HGB 8.5 (L) 04/11/2023   TIBC 336 04/11/2023   IRONPCTSAT 8 (L) 04/11/2023   FERRITIN 24 04/11/2023   recurrent iron deficiency anemia.  Recommend additional IV Venofer     Encounter for antineoplastic chemotherapy Continue treatment as mentioned above.   Orders Placed This Encounter  Procedures   CBC with Differential (Cancer Center Only)    Standing Status:   Future    Expected Date:   04/22/2023    Expiration Date:   04/14/2024   CMP (Cancer Center only)    Standing Status:   Future    Expected Date:   04/22/2023    Expiration Date:   04/14/2024   Lactate dehydrogenase    Standing Status:   Future    Expected Date:   04/22/2023    Expiration Date:   04/14/2024   Follow up 1 week All questions were answered. The patient knows to call the clinic with any problems,  questions or concerns.  [name], MD, PhD [facility] Hematology Oncology 04/15/2023    CHIEF COMPLAINTS/PURPOSE OF CONSULTATION:  CLL  HISTORY OF PRESENTING ILLNESS:  Paul Ana [name] 78 y.o. male presents for follow up of CLL I have reviewed his chart and materials related to his cancer extensively and collaborated history with the patient. Summary of oncologic history is as follows: Oncology History  CLL (chronic lymphocytic leukemia) (HCC)  12/25/2021 - 12/26/2021 Hospital Admission   Patient was admitted due to pain and swelling of right ankle, right knee and left wrist.  Patient was found to have WBC 30.4, with neutrophil 29% and lymphocyte 2%.  Peripheral smear showed leukocytosis with slight left shift in myeloid series and lymphocytosis with abnormal morphology.  Uric acid 5.5, worsening renal function with creatinine 1.35, BUN 18, GFR 55 (baseline creatinine 1.08 on 07/06/2021), lactic acid 1.1.  ESR normal, uric acid 5.5 normal, CRP 22.2, LDH 182  Orthopedic surgery was consulted s/p arthrocentesis, cell count 296 with 72% neutrophil, no crystal. Fluid negative for growth.  Patient received cefepime and vancomycin while in the hospital, Patient was discharged on Keflex for 5 days.   12/28/2021 Cancer Staging   Staging form: Chronic Lymphocytic Leukemia / Small Lymphocytic Lymphoma, AJCC 8th Edition - Clinical stage from 12/28/2021: Modified Rai Stage III (Modified Rai risk: High, Lymphocytosis: Present, Adenopathy: Present, Organomegaly: Absent, Anemia: Present, Thrombocytopenia: Absent) - Signed by [name], MD on 01/20/2022 Stage prefix: Initial diagnosis Hemoglobin (Hgb) (g/dL): 62.1   30/08/6576 Initial  Diagnosis   CLL (chronic lymphocytic leukemia)   12/27/21 peripheral blood flowcytometry showed Involvement by CD5+, CD23+, CD20+, CD22+ clonal B cell population, phenotype typical for chronic lymphocytic leukemia/small lymphocytic lymphoma (CLL/SLL), 2 clones present   Two monoclonal B cell populations were detected which have an identical  phenotype except for light chain expression.    01/05/2022 Imaging   CT chest abdomen pelvis wo contrast 1. Multiple prominent borderline enlarged and mildly enlarged lymph nodes, most evident in the low anatomic pelvis, as above, compatible with reported clinical history of CLL. 2. There also several small pulmonary nodules in the lungs measuring 5 mm or less in size. This is nonspecific, but statistically likely benign. No follow-up needed if patient is low-risk (and has no known or suspected primary neoplasm). Non-contrast chest CT can be considered in 12 months if patient is high-risk. This recommendation 3. Aortic atherosclerosis, in addition to left main and 2 vessel coronary artery disease. Please note that although the presence of coronary artery calcium documents the presence of coronary artery disease, the severity of this disease and any potential stenosis cannot be assessed on this non-gated CT examination. Assessment for  potential risk factor modification, dietary therapy or pharmacologic therapy may be warranted, if clinically indicated. 4. There are calcifications of the aortic valve. Echocardiographic correlation for evaluation of potential valvular dysfunction may be warranted if clinically indicated. 5. 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 6. Diverticulosis without evidence of acute diverticulitis at this time. 7. Mild cardiomegaly.   08/26/2022 Imaging   CT abdomen pelvis w contrast  1. Subcutaneous fat stranding within the left lower quadrant anterior abdominal wall extending into the anterior proximal left thigh, likely related to contusion given history of recent trauma. No fluid collection or hematoma. 2. No acute displaced fracture. 3. Retroperitoneal and pelvic lymphadenopathy as above, with waxing and waning appearance. 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 4. 1.9 cm  hypodensity within the medial aspect of the spleen,nonspecific. Leukemic involvement of the spleen cannot be excluded. 5. Distal colonic diverticulosis without diverticulitis. 6.  Aortic Atherosclerosis      08/23/2022 Colonoscopy showed - Likely malignant partially obstructing tumor in the distal ascending colon. Biopsied. Tattooed. - Three 4 to 6 mm polyps in the transverse colon and in the ascending colon, removed with a cold snare. Resected and retrieved. - Five 3 to 7 mm polyps in the rectum, in the descending colon, in the transverse colon and in the ascending colon, removed with a hot snare. Resected and retrieved. 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 - Non- bleeding internal hemorrhoids.  Pathology showed 1. Ascending Colon Polyp, x2 cold snare - TUBULAR ADENOMA(S) (MULTIPLE FRAGMENTS) - NEGATIVE FOR HIGH-GRADE DYSPLASIA OR MALIGNANCY 2. Ascending Colon Polyp, hot snare - TUBULAR ADENOMA (MULTIPLE FRAGMENTS) - NEGATIVE FOR HIGH-GRADE DYSPLASIA OR MALIGNANCY 3. Ascending Colon Biopsy, mass cbx - TUBULAR ADENOMA (MULTIPLE FRAGMENTS) - SEE NOTE 4. Transverse Colon Polyp, hot snare; cold snare - TUBULAR ADENOMA (MULTIPLE FRAGMENTS) - NEGATIVE FOR HIGH-GRADE DYSPLASIA OR MALIGNANCY 5. Descending Colon Polyp, x2 hot snare - TUBULAR ADENOMA(S) (MULTIPLE FRAGMENTS) - NEGATIVE FOR HIGH-GRADE DYSPLASIA OR MALIGNANCY 6. Rectum, polyp(s), hot snare - TUBULOVILLOUS ADENOMA (MULTIPLE FRAGMENTS) - NEGATIVE FOR HIGH-GRADE DYSPLASIA OR MALIGNANCY I&D Recent admission due to sepsis due to cellulitis, left groin abscess, s/p I&D    INTERVAL HISTORY Paul Steinert [name] is a 78 y.o. male who has above history reviewed by me today presents for follow up visit for CLL, iron  deficiency anemia, colon tubular adenoma with high-grade dysplastic status post resection. Patient was feeling well today.   He has a cough for several weeks. No fever or chills. Some fatigue and SOB. No weight loss.   Denies any fatigue, abdominal pain, blood in his stool.  Recent colonoscopy was aborted due to poor prep. EGD showed esophagitis.  No recent steroid intake or injection. Last steroid injection was in Jan/Feb 2025.   MEDICAL HISTORY:  Past Medical History:  Diagnosis Date   Adenoma of left adrenal gland    Anemia    Aortic atherosclerosis (HCC)    Blood transfusion without reported diagnosis    BPH (benign prostatic hyperplasia)    Bradycardia    CAD (coronary artery disease) 08/26/2022   a.) cCTA 08/26/2022: Ca2+ 14.3 (12th %'ile for age/sex.race match control); (<25%) pLAD   Cellulitis of left thigh 08/2022   Chronic pain syndrome    a.) on COT managed by pain management   Chronic, continuous use of opioids    a.) chronic pain syndrome/chronic back pain; managed by pain management   CKD (chronic kidney disease) stage 3, GFR 30-59 ml/min (HCC)    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   a.) Rai stage III   DDD (degenerative disc disease), cervical    Difficult airway 10/22/2015   a.) 1st attempt with glidescope  --> macroglossic (unsuccessful); b.) 2nd attempt with Mac 4 and direct laryngoscopy with a 8.0 ETT --> tube too large to pass through the cords; b.) 3rd attempt (successful) with a Mac 4 and a 7.5 tube   Diverticulosis    DM (diabetes mellitus), type 2 (HCC)    Dysplasia of prostate    Erectile dysfunction    Frequent falls    Heart failure with mildly reduced ejection fraction (HFmrEF) (HCC)    a.) TTE 10/23/2015: EF 60-65%, mod-sev LVH, mild biatrial dil, degen MV disease, AoV sclerosis, asc Ao 38 mm, G1DD; b.) TTE 08/05/2022: EF 45-50%, mod-sev LVH (speckled pattern), sev biatrial dil, mild MR, AoV sclerosis, G1DD   Hepatic flexure mass 08/23/2022   a.) colonoscopy 08/23/2022: 14 x 15 mm partially obstructing ulcerated distal ascending colon mass; tissue friable --> pathology resulted as tubular adenoma, however felt to be false (-) --> referral to sugery  and colectomy recommended; b.) s/p RIGHT hemicolectomy 09/28/2022   History of MRSA infection 08/28/2022   a.) MRSA PCR (+) 08/28/2022; culture from LEFT thigh abscess   HTN (hypertension)    Hyperlipidemia    Hypogonadism in male    Hypothyroidism    IDA (iron deficiency anemia)    LBBB (left bundle branch block)    Long-term use of aspirin therapy    Lumbar spinal stenosis    Mild cardiomegaly    Multiple lung nodules on CT    NICM (nonischemic cardiomyopathy) (HCC)    a.) TTE 10/23/2015: EF 60-65; b.) TTE 08/05/2022: EF 45-50%   Orchitis and epididymitis 06/16/2013   OSA on CPAP    Palindromic rheumatism, hand    Pericardial effusion    Prostatitis    Sepsis (HCC) 08/2022   Subdural hematoma (HCC) 10/22/2015   a.) s/p traumatic mechanical fall --> CT head 10/22/2015: high-density SDH the left cerebral convexity with13 mm midline shift --> s/p LEFT frontal burr hole craniotomy   Tubular adenoma of colon    Ventral hernia     SURGICAL HISTORY: Past Surgical History:  Procedure Laterality Date   BACK SURGERY     BIOPSY  08/21/2022  Procedure: BIOPSY;  Surgeon: Jaynie Collins, DO;  Location: [facility] ENDOSCOPY;  Service: Gastroenterology;;   BIOPSY  08/23/2022   Procedure: BIOPSY;  Surgeon: Jaynie Collins, DO;  Location: [facility] ENDOSCOPY;  Service: Gastroenterology;;   BIOPSY  02/28/2023   Procedure: BIOPSY;  Surgeon: Jaynie Collins, DO;  Location: [facility] ENDOSCOPY;  Service: Gastroenterology;;   Guss Bunde OF CRANIUM Left 10/22/2015   COLON SURGERY  09/28/2022   COLONOSCOPY WITH PROPOFOL N/A 08/23/2022   Procedure: COLONOSCOPY WITH PROPOFOL;  Surgeon: Jaynie Collins, DO;  Location: [facility] ENDOSCOPY;  Service: Gastroenterology;  Laterality: N/A;   COLONOSCOPY WITH PROPOFOL N/A 02/28/2023   Procedure: COLONOSCOPY WITH PROPOFOL;  Surgeon: Jaynie Collins, DO;  Location: [facility] ENDOSCOPY;  Service: Gastroenterology;  Laterality: N/A;  DM    ESOPHAGOGASTRODUODENOSCOPY (EGD) WITH PROPOFOL N/A 08/21/2022   Procedure: ESOPHAGOGASTRODUODENOSCOPY (EGD) WITH PROPOFOL;  Surgeon: Jaynie Collins, DO;  Location: [facility] ENDOSCOPY;  Service: Gastroenterology;  Laterality: N/A;   ESOPHAGOGASTRODUODENOSCOPY (EGD) WITH PROPOFOL N/A 02/28/2023   Procedure: ESOPHAGOGASTRODUODENOSCOPY (EGD) WITH PROPOFOL;  Surgeon: Jaynie Collins, DO;  Location: [facility] ENDOSCOPY;  Service: Gastroenterology;  Laterality: N/A;   GIVENS CAPSULE STUDY N/A 08/23/2022   Procedure: GIVENS CAPSULE STUDY;  Surgeon: Jaynie Collins, DO;  Location: [facility] ENDOSCOPY;  Service: Gastroenterology;  Laterality: N/A;   INGUINAL HERNIA REPAIR Right 03/09/2023   Procedure: HERNIA REPAIR INGUINAL ADULT, open, RNFA to assist;  Surgeon: Leafy Ro, MD;  Location: [facility] ORS;  Service: General;  Laterality: Right;   IRRIGATION AND DEBRIDEMENT ABSCESS Left 08/28/2022   Procedure: IRRIGATION AND DEBRIDEMENT ABSCESS LEFT UPPER THIGH/GROIN;  Surgeon: Leafy Ro, MD;  Location: [facility] ORS;  Service: General;  Laterality: Left;   LAPAROSCOPIC RIGHT COLECTOMY Right 09/28/2022   Procedure: LAPAROSCOPIC RIGHT COLECTOMY, RNFA to assist;  Surgeon: Leafy Ro, MD;  Location: [facility] ORS;  Service: General;  Laterality: Right;   POLYPECTOMY  08/23/2022   Procedure: POLYPECTOMY;  Surgeon: Jaynie Collins, DO;  Location[facility] ENDOSCOPY;  Service: Gastroenterology;;   SUBMUCOSAL TATTOO INJECTION  08/23/2022   Procedure: SUBMUCOSAL TATTOO INJECTION;  Surgeon: Jaynie Collins, DO;  Location: [facility] ENDOSCOPY;  Service: Gastroenterology;;   TONSILLECTOMY     VENTRAL HERNIA REPAIR N/A 09/28/2022   Procedure: HERNIA REPAIR VENTRAL ADULT;  Surgeon: Leafy Ro, MD;  Location: [facility] ORS;  Service: General;  Laterality: N/A;    SOCIAL HISTORY: Social History   Socioeconomic History   Marital status: Married    Spouse name: Not on file   Number of children: Not on file   Years of  education: Not on file   Highest education level: 5th grade  Occupational History   Not on file  Tobacco Use   Smoking status: Former    Types: Cigarettes    Passive exposure: Past   Smokeless tobacco: Never   Tobacco comments:    Stop smoking iver 40  plus years ago  Vaping Use   Vaping status: Never Used  Substance and Sexual Activity   Alcohol use: No   Drug use: No   Sexual activity: Not Currently  Other Topics Concern   Not on file  Social History Narrative   Not on file   Social Drivers of Health   Financial Resource Strain: Medium Risk (02/24/2023)   Received [facility]   Overall Financial Resource Strain (CARDIA)    Difficulty of Paying Living Expenses: Somewhat hard  Food Insecurity: Food Insecurity Present (02/24/2023)   Received  from [facility]   Hunger Vital Sign    Worried About Running Out of Food in the Last Year: Sometimes true    Ran Out of Food in the Last Year: Sometimes true  Transportation Needs: No Transportation Needs (02/24/2023)   Received from [facility] - Transportation    In the past 12 months, has lack of transportation kept you from medical appointments or from getting medications?: No    Lack of Transportation (Non-Medical): No  Physical Activity: Unknown (07/26/2022)   Exercise Vital Sign    Days of Exercise per Week: Patient declined    Minutes of Exercise per Session: Not on file  Stress: Stress Concern Present (07/26/2022)   [employer] of Occupational Health - Occupational Stress Questionnaire    Feeling of Stress : To some extent  Social Connections: Socially Integrated (12/20/2022)   Social Connection and Isolation Panel [NHANES]    Frequency of Communication with Friends and Family: More than three times a week    Frequency of Social Gatherings with Friends and Family: Patient declined    Attends Religious Services: More than 4 times per year    Active Member  of [employer] or Organizations: Yes    Attends Banker Meetings: Never    Marital Status: Married  Catering manager Violence: Not At Risk (09/28/2022)   Humiliation, Afraid, Rape, and Kick questionnaire    Fear of Current or Ex-Partner: No    Emotionally Abused: No    Physically Abused: No    Sexually Abused: No    FAMILY HISTORY: Family History  Problem Relation Age of Onset   Cancer Maternal Aunt    ADD / ADHD Daughter    Asthma Son    Prostate cancer Neg Hx    Kidney disease Neg Hx    Kidney cancer Neg Hx    Bladder Cancer Neg Hx     ALLERGIES:  has no known allergies.  MEDICATIONS:  Current Outpatient Medications  Medication Sig Dispense Refill   aspirin EC 81 MG tablet Take 1 tablet (81 mg total) by mouth daily. Swallow whole.     calcium-vitamin D (OSCAL WITH D) 500-5 MG-MCG tablet Take 1 tablet by mouth daily with breakfast.     cyanocobalamin (VITAMIN B12) 1000 MCG tablet Take 1 tablet (1,000 mcg total) by mouth daily. 90 tablet 0   hydrALAZINE (APRESOLINE) 50 MG tablet Take 2 tablets (100 mg total) by mouth 2 (two) times daily. 180 tablet 3   levothyroxine (SYNTHROID) 200 MCG tablet Take 1 tablet (200 mcg total) by mouth daily before breakfast. 90 tablet 0   metFORMIN (GLUCOPHAGE) 500 MG tablet Take 2 tablets (1,000 mg total) by mouth 2 (two) times daily with a meal. 120 tablet 0   methocarbamol (ROBAXIN) 500 MG tablet Take 1 tablet (500 mg total) by mouth every 8 (eight) hours as needed for muscle spasms. 30 tablet 0   metoprolol succinate (TOPROL XL) 25 MG 24 hr tablet Take 1 tablet (25 mg total) by mouth daily. 90 tablet 1   oxyCODONE-acetaminophen (PERCOCET) 10-325 MG tablet Take 1 tablet by mouth every 8 (eight) hours as needed for pain. Must last 30 days. 90 tablet 0   [START ON 04/16/2023] oxyCODONE-acetaminophen (PERCOCET) 10-325 MG tablet Take 1 tablet by mouth every 8 (eight) hours as needed for pain. Must last 30 days. 90 tablet 0   [START ON 05/16/2023]  oxyCODONE-acetaminophen (PERCOCET) 10-325 MG tablet Take 1 tablet by mouth  every 8 (eight) hours as needed for pain. Must last 30 days. 90 tablet 0   rosuvastatin (CRESTOR) 40 MG tablet Take 1 tablet (40 mg total) by mouth daily. 90 tablet 1   zanubrutinib (BRUKINSA) 80 MG capsule Take 2 capsules (160 mg total) by mouth 2 (two) times daily. 120 capsule 2   No current facility-administered medications for this visit.    Review of Systems  Constitutional:  Positive for fatigue. Negative for appetite change, chills, fever and unexpected weight change.  HENT:   Negative for hearing loss and voice change.   Eyes:  Negative for eye problems and icterus.  Respiratory:  Positive for cough. Negative for chest tightness and shortness of breath.   Cardiovascular:  Negative for chest pain and leg swelling.  Gastrointestinal:  Negative for abdominal distention and abdominal pain.  Endocrine: Negative for hot flashes.  Genitourinary:  Negative for difficulty urinating, dysuria and frequency.   Musculoskeletal:  Positive for arthralgias.  Skin:  Negative for itching and rash.  Neurological:  Negative for light-headedness and numbness.  Hematological:  Negative for adenopathy. Does not bruise/bleed easily.  Psychiatric/Behavioral:  Negative for confusion.      PHYSICAL EXAMINATION: ECOG PERFORMANCE STATUS: 1 - Symptomatic but completely ambulatory  Vitals:   04/15/23 1223  BP: (!) 150/80  Pulse: 67  Resp: 18  Temp: (!) 97 F (36.1 C)  SpO2: 98%   Filed Weights   04/15/23 1223  Weight: 227 lb 12.8 oz (103.3 kg)    Physical Exam Constitutional:      General: He is not in acute distress.    Appearance: He is obese. He is not diaphoretic.  HENT:     Head: Normocephalic and atraumatic.  Eyes:     General: No scleral icterus.    Pupils: Pupils are equal, round, and reactive to light.  Cardiovascular:     Rate and Rhythm: Normal rate.     Heart sounds: No murmur heard. Pulmonary:      Effort: Pulmonary effort is normal. No respiratory distress.     Breath sounds: No wheezing.  Abdominal:     General: There is no distension.     Palpations: Abdomen is soft.  Genitourinary:    Comments: S/p right inguinal hernia repair, scarring tissue, no erythematous changes or discharge.  Musculoskeletal:     Cervical back: Normal range of motion and neck supple.  Skin:    General: Skin is warm and dry.     Findings: No erythema.  Neurological:     Mental Status: He is alert and oriented to person, place, and time. Mental status is at baseline.     Cranial Nerves: No cranial nerve deficit.     Motor: No abnormal muscle tone.  Psychiatric:        Mood and Affect: Mood and affect normal.    Maurine Cane served as chaperon during physical examination.   LABORATORY DATA:  I have reviewed the data as listed    Latest Ref Rng & Units 04/11/2023   11:41 AM 02/18/2023    9:34 AM 12/22/2022    2:22 PM  CBC  WBC 4.0 - 10.5 K/uL 22.2  11.9  9.7   Hemoglobin 13.0 - 17.0 g/dL 8.5  8.6  9.1   Hematocrit 39.0 - 52.0 % 27.8  27.8  29.5   Platelets 150 - 400 K/uL 145  182  150       Latest Ref Rng & Units 04/11/2023   11:41 AM  02/18/2023    9:34 AM 12/22/2022    2:22 PM  CMP  Glucose 70 - 99 mg/dL 413  244  99   BUN 8 - 23 mg/dL 14  18  12    Creatinine 0.61 - 1.24 mg/dL 0.10  2.72  5.36   Sodium 135 - 145 mmol/L 137  137  146   Potassium 3.5 - 5.1 mmol/L 3.5  4.2  3.8   Chloride 98 - 111 mmol/L 103  103  109   CO2 22 - 32 mmol/L 23  26  25    Calcium 8.9 - 10.3 mg/dL 8.3  8.5  8.8   Total Protein 6.5 - 8.1 g/dL 6.4  6.2  6.2   Total Bilirubin 0.0 - 1.2 mg/dL 0.8  0.6  0.4   Alkaline Phos 38 - 126 U/L 68  84  87   AST 15 - 41 U/L 51  38  74   ALT 0 - 44 U/L 25  31  40      RADIOGRAPHIC STUDIES: I have personally reviewed the radiological images as listed and agreed with the findings in the report. MR CARDIAC MORPHOLOGY W WO CONTRAST Result Date: 03/30/2023 CLINICAL DATA:   LBBB, evaluate EF, amyloidosis EXAM: CARDIAC MRI TECHNIQUE: The patient was scanned on a 1.5 Tesla Siemens magnet. A dedicated cardiac coil was used. Functional imaging was done using Fiesta sequences. 2,3, and 4 chamber views were done to assess for RWMA's. Modified Simpson's rule using a short axis stack was used to calculate an ejection fraction on a dedicated work Research officer, trade [employer]. The patient received 13 cc of Gadavist. After 10 minutes inversion recovery sequences were used to assess for infiltration and scar tissue. Velocity flow mapping performed in the ascending aorta and main pulmonary artery. CONTRAST:  13 cc  of Gadavist FINDINGS: 1. Mildly dilated LV size, Moderate to severe LV basal-septal wall thickness, upto 16 mm in wall thickness. Mild to moderately reduced LV systolic function (LVEF = 43%). There is no LVOT obstruction. There is a small mid wall basal lateral LGE/scar. There is no late gadolinium enhancement in the left ventricular myocardium. LVEDV: 301 ml LVESV: 171 ml SV: 130 ml CO: 4.2  L/min Myocardial mass: 262 g LV native T1 value 1128 ms (normal < 1000 ms) LV ECV value 36% (normal <30%) 2. Mildly dilated right ventricular size, thickness and systolic function (RVEF = 48%). There are no regional wall motion abnormalities. 3.  Mild LA dilation, mild-moderate RA dilation. 4. Normal size of the aortic root, ascending aorta and pulmonary artery. 5. Mild mitral regurgitation, mild tricuspid regurgitation. no significant valvular abnormalities. 6.  Normal pericardium.  No pericardial effusion. IMPRESSION: 1. Mildly dilated LV size, mild-moderately reduced LV systolic function. LVEF 43%. 2. Moderate to severe LV basal-septal wall thickness, upto 16 mm in wall thickness. 3. No LVOT obstruction. 4. There is a small mid wall basal lateral LGE/scar. 5. No evidence for amyloidosis. 6. Findings suggest non ischemic cardiomyopathy. Possible inflammatory disease involving the basal lateral  wall. Electronically Signed   By: Debbe Odea M.D.   On: 03/30/2023 15:38   MR CARDIAC VELOCITY FLOW MAP Result Date: 03/30/2023 CLINICAL DATA:  LBBB, evaluate EF, amyloidosis EXAM: CARDIAC MRI TECHNIQUE: The patient was scanned on a 1.5 Tesla Siemens magnet. A dedicated cardiac coil was used. Functional imaging was done using Fiesta sequences. 2,3, and 4 chamber views were done to assess for RWMA's. Modified Simpson's rule using a short axis stack was  used to calculate an ejection fraction on a dedicated work Research officer, trade union. The patient received 13 cc of Gadavist. After 10 minutes inversion recovery sequences were used to assess for infiltration and scar tissue. Velocity flow mapping performed in the ascending aorta and main pulmonary artery. CONTRAST:  13 cc  of Gadavist FINDINGS: 1. Mildly dilated LV size, Moderate to severe LV basal-septal wall thickness, upto 16 mm in wall thickness. Mild to moderately reduced LV systolic function (LVEF = 43%). There is no LVOT obstruction. There is a small mid wall basal lateral LGE/scar. There is no late gadolinium enhancement in the left ventricular myocardium. LVEDV: 301 ml LVESV: 171 ml SV: 130 ml CO: 4.2  L/min Myocardial mass: 262 g LV native T1 value 1128 ms (normal < 1000 ms) LV ECV value 36% (normal <30%) 2. Mildly dilated right ventricular size, thickness and systolic function (RVEF = 48%). There are no regional wall motion abnormalities. 3.  Mild LA dilation, mild-moderate RA dilation. 4. Normal size of the aortic root, ascending aorta and pulmonary artery. 5. Mild mitral regurgitation, mild tricuspid regurgitation. no significant valvular abnormalities. 6.  Normal pericardium.  No pericardial effusion. IMPRESSION: 1. Mildly dilated LV size, mild-moderately reduced LV systolic function. LVEF 43%. 2. Moderate to severe LV basal-septal wall thickness, upto 16 mm in wall thickness. 3. No LVOT obstruction. 4. There is a small mid wall basal  lateral LGE/scar. 5. No evidence for amyloidosis. 6. Findings suggest non ischemic cardiomyopathy. Possible inflammatory disease involving the basal lateral wall. Electronically Signed   By: Debbe Odea M.D.   On: 03/30/2023 15:38   MR CARDIAC VELOCITY FLOW MAP Result Date: 03/30/2023 CLINICAL DATA:  LBBB, evaluate EF, amyloidosis EXAM: CARDIAC MRI TECHNIQUE: The patient was scanned on a 1.5 Tesla Siemens magnet. A dedicated cardiac coil was used. Functional imaging was done using [employer] sequences. 2,3, and 4 chamber views were done to assess for RWMA's. Modified Simpson's rule using a short axis stack was used to calculate an ejection fraction on a dedicated work [employer]. The patient received 13 cc of Gadavist. After 10 minutes inversion recovery sequences were used to assess for infiltration and scar tissue. Velocity flow mapping performed in the ascending aorta and main pulmonary artery. CONTRAST:  13 cc  of Gadavist FINDINGS: 1. Mildly dilated LV size, Moderate to severe LV basal-septal wall thickness, upto 16 mm in wall thickness. Mild to moderately reduced LV systolic function (LVEF = 43%). There is no LVOT obstruction. There is a small mid wall basal lateral LGE/scar. There is no late gadolinium enhancement in the left ventricular myocardium. LVEDV: 301 ml LVESV: 171 ml SV: 130 ml CO: 4.2  L/min Myocardial mass: 262 g LV native T1 value 1128 ms (normal < 1000 ms) LV ECV value 36% (normal <30%) 2. Mildly dilated right ventricular size, thickness and systolic function (RVEF = 48%). There are no regional wall motion abnormalities. 3.  Mild LA dilation, mild-moderate RA dilation. 4. Normal size of the aortic root, ascending aorta and pulmonary artery. 5. Mild mitral regurgitation, mild tricuspid regurgitation. no significant valvular abnormalities. 6.  Normal pericardium.  No pericardial effusion. IMPRESSION: 1. Mildly dilated LV size, mild-moderately reduced LV systolic function.  LVEF 43%. 2. Moderate to severe LV basal-septal wall thickness, upto 16 mm in wall thickness. 3. No LVOT obstruction. 4. There is a small mid wall basal lateral LGE/scar. 5. No evidence for amyloidosis. 6. Findings suggest non ischemic cardiomyopathy. Possible inflammatory disease involving the basal  lateral wall. Electronically Signed   By: Debbe Odea M.D.   On: 03/30/2023 15:38

## 2023-04-15 NOTE — Assessment & Plan Note (Addendum)
 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 Lab Results  Component Value Date   HGB 8.5 (L) 04/11/2023   TIBC 336 04/11/2023   IRONPCTSAT 8 (L) 04/11/2023   FERRITIN 24 04/11/2023   recurrent iron deficiency anemia.  Recommend additional IV Venofer

## 2023-04-18 ENCOUNTER — Encounter: Payer: Self-pay | Admitting: Gastroenterology

## 2023-04-18 ENCOUNTER — Encounter: Payer: Self-pay | Admitting: Surgery

## 2023-04-18 ENCOUNTER — Ambulatory Visit (INDEPENDENT_AMBULATORY_CARE_PROVIDER_SITE_OTHER): Admitting: Surgery

## 2023-04-18 VITALS — BP 152/71 | HR 60 | Temp 98.2°F | Ht 73.0 in | Wt 230.0 lb

## 2023-04-18 DIAGNOSIS — K4091 Unilateral inguinal hernia, without obstruction or gangrene, recurrent: Secondary | ICD-10-CM

## 2023-04-18 DIAGNOSIS — Z09 Encounter for follow-up examination after completed treatment for conditions other than malignant neoplasm: Secondary | ICD-10-CM

## 2023-04-18 NOTE — Patient Instructions (Signed)

## 2023-04-19 ENCOUNTER — Encounter: Payer: Self-pay | Admitting: Surgery

## 2023-04-19 NOTE — Progress Notes (Signed)
 Paul Briggs is following up from open inguinal hernia repair 03/09/23 He is doing much better and the swelling has decreased.  He is ambulating back to normal.  No fevers no chills.  PE NAD Abd: soft, nt, incision c/d/I, no infection or hematomas, no peritonitis, no recurrence   A/P Doing very well w/o complications RTC prn

## 2023-04-21 ENCOUNTER — Encounter: Payer: Self-pay | Admitting: Oncology

## 2023-04-21 ENCOUNTER — Inpatient Hospital Stay

## 2023-04-21 ENCOUNTER — Inpatient Hospital Stay: Admitting: Oncology

## 2023-04-21 VITALS — BP 145/71 | HR 62 | Temp 96.9°F | Resp 19 | Wt 234.4 lb

## 2023-04-21 VITALS — BP 125/80 | HR 63 | Resp 18

## 2023-04-21 DIAGNOSIS — C9110 Chronic lymphocytic leukemia of B-cell type not having achieved remission: Secondary | ICD-10-CM | POA: Diagnosis not present

## 2023-04-21 DIAGNOSIS — N1831 Chronic kidney disease, stage 3a: Secondary | ICD-10-CM

## 2023-04-21 DIAGNOSIS — Z5111 Encounter for antineoplastic chemotherapy: Secondary | ICD-10-CM

## 2023-04-21 DIAGNOSIS — R051 Acute cough: Secondary | ICD-10-CM

## 2023-04-21 DIAGNOSIS — D509 Iron deficiency anemia, unspecified: Secondary | ICD-10-CM | POA: Diagnosis not present

## 2023-04-21 DIAGNOSIS — D72829 Elevated white blood cell count, unspecified: Secondary | ICD-10-CM

## 2023-04-21 LAB — CBC WITH DIFFERENTIAL (CANCER CENTER ONLY)
Abs Immature Granulocytes: 0.2 10*3/uL — ABNORMAL HIGH (ref 0.00–0.07)
Basophils Absolute: 0.1 10*3/uL (ref 0.0–0.1)
Basophils Relative: 0 %
Eosinophils Absolute: 0.2 10*3/uL (ref 0.0–0.5)
Eosinophils Relative: 1 %
HCT: 25.7 % — ABNORMAL LOW (ref 39.0–52.0)
Hemoglobin: 7.8 g/dL — ABNORMAL LOW (ref 13.0–17.0)
Immature Granulocytes: 1 %
Lymphocytes Relative: 39 %
Lymphs Abs: 7.7 10*3/uL — ABNORMAL HIGH (ref 0.7–4.0)
MCH: 27.1 pg (ref 26.0–34.0)
MCHC: 30.4 g/dL (ref 30.0–36.0)
MCV: 89.2 fL (ref 80.0–100.0)
Monocytes Absolute: 0.8 10*3/uL (ref 0.1–1.0)
Monocytes Relative: 4 %
Neutro Abs: 10.7 10*3/uL — ABNORMAL HIGH (ref 1.7–7.7)
Neutrophils Relative %: 55 %
Platelet Count: 210 10*3/uL (ref 150–400)
RBC: 2.88 MIL/uL — ABNORMAL LOW (ref 4.22–5.81)
RDW: 20.2 % — ABNORMAL HIGH (ref 11.5–15.5)
Smear Review: NORMAL
WBC Count: 19.7 10*3/uL — ABNORMAL HIGH (ref 4.0–10.5)
nRBC: 0 % (ref 0.0–0.2)

## 2023-04-21 LAB — RETIC PANEL
Immature Retic Fract: 21.4 % — ABNORMAL HIGH (ref 2.3–15.9)
RBC.: 2.86 MIL/uL — ABNORMAL LOW (ref 4.22–5.81)
Retic Count, Absolute: 59.8 10*3/uL (ref 19.0–186.0)
Retic Ct Pct: 2.1 % (ref 0.4–3.1)
Reticulocyte Hemoglobin: 24.1 pg — ABNORMAL LOW (ref >27.9)

## 2023-04-21 LAB — CMP (CANCER CENTER ONLY)
ALT: 25 U/L (ref 0–44)
AST: 46 U/L — ABNORMAL HIGH (ref 15–41)
Albumin: 3.4 g/dL — ABNORMAL LOW (ref 3.5–5.0)
Alkaline Phosphatase: 67 U/L (ref 38–126)
Anion gap: 10 (ref 5–15)
BUN: 13 mg/dL (ref 8–23)
CO2: 25 mmol/L (ref 22–32)
Calcium: 8.3 mg/dL — ABNORMAL LOW (ref 8.9–10.3)
Chloride: 103 mmol/L (ref 98–111)
Creatinine: 1.27 mg/dL — ABNORMAL HIGH (ref 0.61–1.24)
GFR, Estimated: 58 mL/min — ABNORMAL LOW (ref >60)
Glucose, Bld: 203 mg/dL — ABNORMAL HIGH (ref 70–99)
Potassium: 3.4 mmol/L — ABNORMAL LOW (ref 3.5–5.1)
Sodium: 138 mmol/L (ref 135–145)
Total Bilirubin: 0.5 mg/dL (ref 0.0–1.2)
Total Protein: 6.4 g/dL — ABNORMAL LOW (ref 6.5–8.1)

## 2023-04-21 LAB — LACTATE DEHYDROGENASE: LDH: 244 U/L — ABNORMAL HIGH (ref 98–192)

## 2023-04-21 MED ORDER — IRON SUCROSE 20 MG/ML IV SOLN
200.0000 mg | Freq: Once | INTRAVENOUS | Status: AC
Start: 2023-04-21 — End: 2023-04-21
  Administered 2023-04-21: 200 mg via INTRAVENOUS
  Filled 2023-04-21: qty 10

## 2023-04-21 NOTE — Assessment & Plan Note (Signed)
 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 Lab Results  Component Value Date   HGB 7.8 (L) 04/21/2023   TIBC 336 04/11/2023   IRONPCTSAT 8 (L) 04/11/2023   FERRITIN 24 04/11/2023   recurrent iron deficiency anemia.  Rapid drop of hemoglobin. He will receive Venofer 200 mg today and repeat another dose tomorrow.  No chair time for blood transfusion tomorrow.  He will follow-up next week with recheck blood work. Suspect GI bleeding.  He is getting colonoscopy early next week.

## 2023-04-21 NOTE — Patient Instructions (Signed)

## 2023-04-21 NOTE — Progress Notes (Signed)
 Hematology/Oncology Progress note [phone] [phone]        ASSESSMENT & PLAN:   Cancer Staging  CLL (chronic lymphocytic leukemia) (HCC) Staging form: Chronic Lymphocytic Leukemia / Small Lymphocytic Lymphoma, AJCC 8th Edition - Clinical stage from 12/28/2021: Modified Rai Stage III (Modified Rai risk: High, Lymphocytosis: Present, Adenopathy: Present, Organomegaly: Absent, Anemia: Present, Thrombocytopenia: Absent) - Signed by [name], MD on 01/20/2022   CLL (chronic lymphocytic leukemia) [facility]) Labs are reviewed and discussed with patient. CT chest abdomen pelvis results reviewed and discussed with patient.   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 continue Zanubrutinib 160 mg twice daily      IDA (iron deficiency anemia) Anemia is likely multifactorial, could be due to CLL, anemia due to chronic kidney disease and IDA. Lab Results  Component Value Date   HGB 7.8 (L) 04/21/2023   TIBC 336 04/11/2023   IRONPCTSAT 8 (L) 04/11/2023   FERRITIN 24 04/11/2023   recurrent iron deficiency anemia.  Rapid drop of hemoglobin. He will receive Venofer 200 mg today and repeat another dose tomorrow.  No chair time for blood transfusion tomorrow.  He will follow-up next week with recheck blood work. Suspect GI bleeding.  He is getting colonoscopy early next week.    CKD (chronic kidney disease) stage 3, GFR 30-59 ml/min (HCC) Encourage oral hydration and avoid nephrotoxins.     Leukocytosis Leukocytosis slightly improved..  ? Reactive, check blood culture.  I will obtain CT chest given his symptom of persistent cough  Orders Placed This Encounter  Procedures   Culture, blood (routine x 2)    Standing Status:   Future    Number of Occurrences:   1    Expected Date:   04/21/2023    Expiration Date:   04/20/2024   Culture, blood (routine x 2)    Standing Status:    Future    Number of Occurrences:   1    Expected Date:   04/21/2023    Expiration Date:   04/20/2024   CT Chest Wo Contrast    Standing Status:   Future    Expiration Date:   04/20/2024    Preferred imaging location?:   [facility]   CBC with Differential (Cancer Center Only)    Standing Status:   Future    Expected Date:   04/26/2023    Expiration Date:   04/20/2024   Retic Panel    Standing Status:   Future    Expected Date:   04/26/2023    Expiration Date:   04/20/2024   Sample to Blood Bank    Standing Status:   Future    Expected Date:   04/26/2023    Expiration Date:   04/20/2024   Follow up 1 week All questions were answered. The patient knows to call the clinic with any problems, questions or concerns.  [name], MD, PhD [facility] Hematology Oncology 04/21/2023    CHIEF COMPLAINTS/PURPOSE OF CONSULTATION:  CLL  HISTORY OF PRESENTING ILLNESS:  Paul Ana [name] 78 y.o. male presents for follow up of CLL I have reviewed his chart and materials related to his cancer extensively and collaborated history with the patient. Summary of oncologic history is as follows: Oncology History  CLL (chronic lymphocytic leukemia) (HCC)  12/25/2021 - 12/26/2021 Hospital Admission   Patient was admitted due to pain and swelling of right ankle, right knee and left wrist.  Patient was found to have WBC 30.4, with neutrophil 29% and lymphocyte 2%.  Peripheral smear showed leukocytosis with slight left shift in myeloid series and lymphocytosis with abnormal morphology.  Uric acid 5.5, worsening renal function with creatinine 1.35, BUN 18, GFR 55 (baseline creatinine 1.08 on 07/06/2021), lactic acid 1.1.  ESR normal, uric acid 5.5 normal, CRP 22.2, LDH 182  Orthopedic surgery was consulted s/p arthrocentesis, cell count 296 with 72% neutrophil, no crystal. Fluid negative for growth.  Patient received cefepime and vancomycin while in the hospital, Patient was discharged on Keflex for 5  days.   12/28/2021 Cancer Staging   Staging form: Chronic Lymphocytic Leukemia / Small Lymphocytic Lymphoma, AJCC 8th Edition - Clinical stage from 12/28/2021: Modified Rai Stage III (Modified Rai risk: High, Lymphocytosis: Present, Adenopathy: Present, Organomegaly: Absent, Anemia: Present, Thrombocytopenia: Absent) - Signed by [name], MD on 01/20/2022 Stage prefix: Initial diagnosis Hemoglobin (Hgb) (g/dL): 13.0   86/05/7844 Initial Diagnosis   CLL (chronic lymphocytic leukemia)   12/27/21 peripheral blood flowcytometry showed Involvement by CD5+, CD23+, CD20+, CD22+ clonal B cell population, phenotype typical for 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    01/05/2022 Imaging   CT chest abdomen pelvis wo contrast 1. Multiple prominent borderline enlarged and mildly enlarged lymph nodes, most evident in the low anatomic pelvis, as above, compatible with reported clinical history of CLL. 2. There also several small pulmonary nodules in the lungs measuring 5 mm or less in size. This is nonspecific, but statistically likely benign. No follow-up needed if patient is low-risk (and has no known or suspected primary neoplasm). Non-contrast chest CT can be considered in 12 months if patient is high-risk. This recommendation 3. Aortic atherosclerosis, in addition to left main and 2 vessel coronary artery disease. Please note that although the presence of coronary artery calcium documents the presence of coronary artery disease, the severity of this disease and any potential stenosis cannot be assessed on this non-gated CT examination. Assessment for  potential risk factor modification, dietary therapy or pharmacologic therapy may be warranted, if clinically indicated. 4. There are calcifications of the aortic valve. Echocardiographic correlation  for evaluation of potential valvular dysfunction may be warranted if clinically indicated. 5. 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 6. Diverticulosis without evidence of acute diverticulitis at this time. 7. Mild cardiomegaly.   08/26/2022 Imaging   CT abdomen pelvis w contrast  1. Subcutaneous fat stranding within the left lower quadrant anterior abdominal wall extending into the anterior proximal left thigh, likely related to contusion given history of recent trauma. No fluid collection or hematoma. 2. No acute displaced fracture. 3. Retroperitoneal and pelvic lymphadenopathy as above, with waxing and waning appearance. 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 4. 1.9 cm hypodensity within the medial aspect of the spleen,nonspecific. Leukemic involvement of the spleen cannot be excluded. 5. Distal colonic diverticulosis without diverticulitis. 6.  Aortic Atherosclerosis      08/23/2022 Colonoscopy showed - Likely malignant partially obstructing tumor in the distal ascending colon. Biopsied. Tattooed. - Three 4 to 6 mm polyps in the transverse colon and in the ascending colon, removed with a cold snare. Resected and retrieved. - Five 3 to 7 mm polyps in the rectum, in the descending colon, in the transverse colon and in the ascending colon, removed with a hot snare. Resected and retrieved. - Diverticulosis in  the left colon. - Non- bleeding internal hemorrhoids.  Pathology showed 1. Ascending Colon Polyp, x2 cold snare - TUBULAR ADENOMA(S) (MULTIPLE FRAGMENTS) - NEGATIVE FOR HIGH-GRADE DYSPLASIA OR MALIGNANCY 2. Ascending Colon Polyp, hot snare - TUBULAR ADENOMA (MULTIPLE FRAGMENTS) - NEGATIVE FOR HIGH-GRADE DYSPLASIA OR MALIGNANCY 3. Ascending Colon Biopsy, mass cbx - TUBULAR ADENOMA (MULTIPLE FRAGMENTS) - SEE NOTE 4. Transverse Colon Polyp, hot snare; cold snare - TUBULAR ADENOMA (MULTIPLE FRAGMENTS) - NEGATIVE FOR HIGH-GRADE DYSPLASIA OR MALIGNANCY 5.  Descending Colon Polyp, x2 hot snare - TUBULAR ADENOMA(S) (MULTIPLE FRAGMENTS) - NEGATIVE FOR HIGH-GRADE DYSPLASIA OR MALIGNANCY 6. Rectum, polyp(s), hot snare - TUBULOVILLOUS ADENOMA (MULTIPLE FRAGMENTS) - NEGATIVE FOR HIGH-GRADE DYSPLASIA OR MALIGNANCY I&D Recent admission due to sepsis due to cellulitis, left groin abscess, s/p I&D    INTERVAL HISTORY Oronde Hallenbeck [name] is a 78 y.o. male who has above history reviewed by me today presents for follow up visit for CLL, iron deficiency anemia, colon tubular adenoma with high-grade dysplastic status post resection. Patient was feeling more tired today.  Received Venofer 200 mg last week. He has a rattled cough for several weeks. No fever or chills. Some fatigue and SOB. Denies  abdominal pain, blood in his stool.  Recent colonoscopy was aborted due to poor prep. EGD showed esophagitis.  There is plan for repeat colonoscopy early next week.   MEDICAL HISTORY:  Past Medical History:  Diagnosis Date   Adenoma of left adrenal gland    Anemia    Aortic atherosclerosis (HCC)    Blood transfusion without reported diagnosis    BPH (benign prostatic hyperplasia)    Bradycardia    CAD (coronary artery disease) 08/26/2022   a.) cCTA 08/26/2022: Ca2+ 14.3 (12th %'ile for age/sex.race match control); (<25%) pLAD   Cellulitis of left thigh 08/2022   Chronic pain syndrome    a.) on COT managed by pain management   Chronic, continuous use of opioids    a.) chronic pain syndrome/chronic back pain; managed by pain management   CKD (chronic kidney disease) stage 3, GFR 30-59 ml/min (HCC)    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   a.) Rai stage III   DDD (degenerative disc disease), cervical    Difficult airway 10/22/2015   a.) 1st attempt with glidescope  --> macroglossic (unsuccessful); b.) 2nd attempt with Mac 4 and direct laryngoscopy with a 8.0 ETT --> tube too large to pass through the cords; b.) 3rd attempt  (successful) with a Mac 4 and a 7.5 tube   Diverticulosis    DM (diabetes mellitus), type 2 (HCC)    Dysplasia of prostate    Erectile dysfunction    Frequent falls    Heart failure with mildly reduced ejection fraction (HFmrEF) (HCC)    a.) TTE 10/23/2015: EF 60-65%, mod-sev LVH, mild biatrial dil, degen MV disease, AoV sclerosis, asc Ao 38 mm, G1DD; b.) TTE 08/05/2022: EF 45-50%, mod-sev LVH (speckled pattern), sev biatrial dil, mild MR, AoV sclerosis, G1DD   Hepatic flexure mass 08/23/2022   a.) colonoscopy 08/23/2022: 14 x 15 mm partially obstructing ulcerated distal ascending colon mass; tissue friable --> pathology resulted as tubular adenoma, however felt to be false (-) --> referral to sugery and colectomy recommended; b.) s/p RIGHT hemicolectomy 09/28/2022   History of MRSA infection 08/28/2022   a.) MRSA PCR (+) 08/28/2022; culture from LEFT thigh abscess   HTN (hypertension)    Hyperlipidemia    Hypogonadism in male    Hypothyroidism    IDA (  iron deficiency anemia)    LBBB (left bundle branch block)    Long-term use of aspirin therapy    Lumbar spinal stenosis    Mild cardiomegaly    Multiple lung nodules on CT    NICM (nonischemic cardiomyopathy) (HCC)    a.) TTE 10/23/2015: EF 60-65; b.) TTE 08/05/2022: EF 45-50%   Orchitis and epididymitis 06/16/2013   OSA on CPAP    Palindromic rheumatism, hand    Pericardial effusion    Prostatitis    Sepsis (HCC) 08/2022   Subdural hematoma (HCC) 10/22/2015   a.) s/p traumatic mechanical fall --> CT head 10/22/2015: high-density SDH the left cerebral convexity with13 mm midline shift --> s/p LEFT frontal burr hole craniotomy   Tubular adenoma of colon    Ventral hernia     SURGICAL HISTORY: Past Surgical History:  Procedure Laterality Date   BACK SURGERY     BIOPSY  08/21/2022   Procedure: BIOPSY;  Surgeon: Jaynie Collins, DO;  Location: [facility] ENDOSCOPY;  Service: Gastroenterology;;   BIOPSY  08/23/2022   Procedure:  BIOPSY;  Surgeon: Jaynie Collins, DO;  Location: [facility] ENDOSCOPY;  Service: Gastroenterology;;   BIOPSY  02/28/2023   Procedure: BIOPSY;  Surgeon: Jaynie Collins, DO;  Location: [facility] ENDOSCOPY;  Service: Gastroenterology;;   Guss Bunde OF CRANIUM Left 10/22/2015   COLON SURGERY  09/28/2022   COLONOSCOPY WITH PROPOFOL N/A 08/23/2022   Procedure: COLONOSCOPY WITH PROPOFOL;  Surgeon: Jaynie Collins, DO;  Location: [facility] ENDOSCOPY;  Service: Gastroenterology;  Laterality: N/A;   COLONOSCOPY WITH PROPOFOL N/A 02/28/2023   Procedure: COLONOSCOPY WITH PROPOFOL;  Surgeon: Jaynie Collins, DO;  Location: [facility] ENDOSCOPY;  Service: Gastroenterology;  Laterality: N/A;  DM   ESOPHAGOGASTRODUODENOSCOPY (EGD) WITH PROPOFOL N/A 08/21/2022   Procedure: ESOPHAGOGASTRODUODENOSCOPY (EGD) WITH PROPOFOL;  Surgeon: Jaynie Collins, DO;  Location: [facility] ENDOSCOPY;  Service: Gastroenterology;  Laterality: N/A;   ESOPHAGOGASTRODUODENOSCOPY (EGD) WITH PROPOFOL N/A 02/28/2023   Procedure: ESOPHAGOGASTRODUODENOSCOPY (EGD) WITH PROPOFOL;  Surgeon: Jaynie Collins, DO;  Location: [facility] ENDOSCOPY;  Service: Gastroenterology;  Laterality: N/A;   GIVENS CAPSULE STUDY N/A 08/23/2022   Procedure: GIVENS CAPSULE STUDY;  Surgeon: Jaynie Collins, DO;  Location: [facility] ENDOSCOPY;  Service: Gastroenterology;  Laterality: N/A;   HERNIA REPAIR     INGUINAL HERNIA REPAIR Right 03/09/2023   Procedure: HERNIA REPAIR INGUINAL ADULT, open, RNFA to assist;  Surgeon: Leafy Ro, MD;  Location: [facility] ORS;  Service: General;  Laterality: Right;   IRRIGATION AND DEBRIDEMENT ABSCESS Left 08/28/2022   Procedure: IRRIGATION AND DEBRIDEMENT ABSCESS LEFT UPPER THIGH/GROIN;  Surgeon: Leafy Ro, MD;  Location: [facility] ORS;  Service: General;  Laterality: Left;   LAPAROSCOPIC RIGHT COLECTOMY Right 09/28/2022   Procedure: LAPAROSCOPIC RIGHT COLECTOMY, RNFA to assist;  Surgeon: Leafy Ro, MD;  Location: [facility]  ORS;  Service: General;  Laterality: Right;   POLYPECTOMY  08/23/2022   Procedure: POLYPECTOMY;  Surgeon: Jaynie Collins, DO;  Location: [facility] ENDOSCOPY;  Service: Gastroenterology;;   SUBMUCOSAL TATTOO INJECTION  08/23/2022   Procedure: SUBMUCOSAL TATTOO INJECTION;  Surgeon: Jaynie Collins, DO;  Location: [facility] ENDOSCOPY;  Service: Gastroenterology;;   TONSILLECTOMY     VENTRAL HERNIA REPAIR N/A 09/28/2022   Procedure: HERNIA REPAIR VENTRAL ADULT;  Surgeon: Leafy Ro, MD;  Location: [facility] ORS;  Service: General;  Laterality: N/A;    SOCIAL HISTORY: Social History   Socioeconomic History   Marital status: Married    Spouse name: Not on file  Number of children: Not on file   Years of education: Not on file   Highest education level: 5th grade  Occupational History   Not on file  Tobacco Use   Smoking status: Former    Types: Cigarettes    Passive exposure: Past   Smokeless tobacco: Never   Tobacco comments:    Stop smoking iver 40  plus years ago  Vaping Use   Vaping status: Never Used  Substance and Sexual Activity   Alcohol use: No   Drug use: No   Sexual activity: Not Currently  Other Topics Concern   Not on file  Social History Narrative   Not on file   Social Drivers of Health   Financial Resource Strain: Medium Risk (02/24/2023)   Received from [facility]   Overall Financial Resource Strain (CARDIA)    Difficulty of Paying Living Expenses: Somewhat hard  Food Insecurity: Food Insecurity Present (02/24/2023)   Received from [facility]   Hunger Vital Sign    Worried About Running Out of Food in the Last Year: Sometimes true    Ran Out of Food in the Last Year: Sometimes true  Transportation Needs: No Transportation Needs (02/24/2023)   Received from [facility] - Transportation    In the past 12 months, has lack of transportation kept you from medical appointments or from getting  medications?: No    Lack of Transportation (Non-Medical): No  Physical Activity: Unknown (07/26/2022)   Exercise Vital Sign    Days of Exercise per Week: Patient declined    Minutes of Exercise per Session: Not on file  Stress: Stress Concern Present (07/26/2022)   [employer] of Occupational Health - Occupational Stress Questionnaire    Feeling of Stress : To some extent  Social Connections: Socially Integrated (12/20/2022)   Social Connection and Isolation Panel [NHANES]    Frequency of Communication with Friends and Family: More than three times a week    Frequency of Social Gatherings with Friends and Family: Patient declined    Attends Religious Services: More than 4 times per year    Active Member of [employer] or Organizations: Yes    Attends Banker Meetings: Never    Marital Status: Married  Catering manager Violence: Not At Risk (09/28/2022)   Humiliation, Afraid, Rape, and Kick questionnaire    Fear of Current or Ex-Partner: No    Emotionally Abused: No    Physically Abused: No    Sexually Abused: No    FAMILY HISTORY: Family History  Problem Relation Age of Onset   Cancer Maternal Aunt    ADD / ADHD Daughter    Asthma Son    Prostate cancer Neg Hx    Kidney disease Neg Hx    Kidney cancer Neg Hx    Bladder Cancer Neg Hx     ALLERGIES:  has no known allergies.  MEDICATIONS:  Current Outpatient Medications  Medication Sig Dispense Refill   aspirin EC 81 MG tablet Take 1 tablet (81 mg total) by mouth daily. Swallow whole.     calcium-vitamin D (OSCAL WITH D) 500-5 MG-MCG tablet Take 1 tablet by mouth daily with breakfast.     cyanocobalamin (VITAMIN B12) 1000 MCG tablet Take 1 tablet (1,000 mcg total) by mouth daily. 90 tablet 0   hydrALAZINE (APRESOLINE) 50 MG tablet Take 2 tablets (100 mg total) by mouth 2 (two) times daily. 180 tablet 3   levothyroxine (SYNTHROID)  200 MCG tablet Take 1 tablet (200 mcg total) by mouth daily before breakfast. 90 tablet  0   metFORMIN (GLUCOPHAGE) 500 MG tablet Take 2 tablets (1,000 mg total) by mouth 2 (two) times daily with a meal. 120 tablet 0   methocarbamol (ROBAXIN) 500 MG tablet Take 1 tablet (500 mg total) by mouth every 8 (eight) hours as needed for muscle spasms. 30 tablet 0   metoprolol succinate (TOPROL XL) 25 MG 24 hr tablet Take 1 tablet (25 mg total) by mouth daily. 90 tablet 1   oxyCODONE-acetaminophen (PERCOCET) 10-325 MG tablet Take 1 tablet by mouth every 8 (eight) hours as needed for pain. Must last 30 days. 90 tablet 0   [START ON 05/16/2023] oxyCODONE-acetaminophen (PERCOCET) 10-325 MG tablet Take 1 tablet by mouth every 8 (eight) hours as needed for pain. Must last 30 days. 90 tablet 0   rosuvastatin (CRESTOR) 40 MG tablet Take 1 tablet (40 mg total) by mouth daily. 90 tablet 1   zanubrutinib (BRUKINSA) 80 MG capsule Take 2 capsules (160 mg total) by mouth 2 (two) times daily. 120 capsule 2   No current facility-administered medications for this visit.    Review of Systems  Constitutional:  Positive for fatigue. Negative for appetite change, chills, fever and unexpected weight change.  HENT:   Negative for hearing loss and voice change.   Eyes:  Negative for eye problems and icterus.  Respiratory:  Positive for cough. Negative for chest tightness and shortness of breath.   Cardiovascular:  Negative for chest pain and leg swelling.  Gastrointestinal:  Negative for abdominal distention and abdominal pain.  Endocrine: Negative for hot flashes.  Genitourinary:  Negative for difficulty urinating, dysuria and frequency.   Musculoskeletal:  Positive for arthralgias.  Skin:  Negative for itching and rash.  Neurological:  Negative for light-headedness and numbness.  Hematological:  Negative for adenopathy. Does not bruise/bleed easily.  Psychiatric/Behavioral:  Negative for confusion.      PHYSICAL EXAMINATION: ECOG PERFORMANCE STATUS: 1 - Symptomatic but completely ambulatory  Vitals:    04/21/23 1338  BP: (!) 145/71  Pulse: 62  Resp: 19  Temp: (!) 96.9 F (36.1 C)  SpO2: 98%   Filed Weights   04/21/23 1338  Weight: 234 lb 6.4 oz (106.3 kg)    Physical Exam Constitutional:      General: He is not in acute distress.    Appearance: He is obese. He is not diaphoretic.  HENT:     Head: Normocephalic and atraumatic.  Eyes:     General: No scleral icterus.    Pupils: Pupils are equal, round, and reactive to light.  Cardiovascular:     Rate and Rhythm: Normal rate.     Heart sounds: No murmur heard. Pulmonary:     Effort: Pulmonary effort is normal. No respiratory distress.     Breath sounds: No wheezing.  Abdominal:     General: There is no distension.     Palpations: Abdomen is soft.  Genitourinary:    Comments: S/p right inguinal hernia repair, scarring tissue, no erythematous changes or discharge.  Musculoskeletal:     Cervical back: Normal range of motion and neck supple.  Skin:    General: Skin is warm and dry.     Findings: No erythema.  Neurological:     Mental Status: He is alert and oriented to person, place, and time. Mental status is at baseline.     Cranial Nerves: No cranial nerve deficit.  Motor: No abnormal muscle tone.  Psychiatric:        Mood and Affect: Mood and affect normal.    Maurine Cane served as chaperon during physical examination.   LABORATORY DATA:  I have reviewed the data as listed    Latest Ref Rng & Units 04/21/2023    1:04 PM 04/11/2023   11:41 AM 02/18/2023    9:34 AM  CBC  WBC 4.0 - 10.5 K/uL 19.7  22.2  11.9   Hemoglobin 13.0 - 17.0 g/dL 7.8  8.5  8.6   Hematocrit 39.0 - 52.0 % 25.7  27.8  27.8   Platelets 150 - 400 K/uL 210  145  182       Latest Ref Rng & Units 04/21/2023    1:04 PM 04/11/2023   11:41 AM 02/18/2023    9:34 AM  CMP  Glucose 70 - 99 mg/dL 161  096  045   BUN 8 - 23 mg/dL 13  14  18    Creatinine 0.61 - 1.24 mg/dL 4.09  8.11  9.14   Sodium 135 - 145 mmol/L 138  137  137   Potassium 3.5 -  5.1 mmol/L 3.4  3.5  4.2   Chloride 98 - 111 mmol/L 103  103  103   CO2 22 - 32 mmol/L 25  23  26    Calcium 8.9 - 10.3 mg/dL 8.3  8.3  8.5   Total Protein 6.5 - 8.1 g/dL 6.4  6.4  6.2   Total Bilirubin 0.0 - 1.2 mg/dL 0.5  0.8  0.6   Alkaline Phos 38 - 126 U/L 67  68  84   AST 15 - 41 U/L 46  51  38   ALT 0 - 44 U/L 25  25  31       RADIOGRAPHIC STUDIES: I have personally reviewed the radiological images as listed and agreed with the findings in the report. MR CARDIAC MORPHOLOGY W WO CONTRAST Result Date: 03/30/2023 CLINICAL DATA:  LBBB, evaluate EF, amyloidosis EXAM: CARDIAC MRI TECHNIQUE: The patient was scanned on a 1.5 Tesla Siemens magnet. A dedicated cardiac coil was used. Functional imaging was done using Fiesta sequences. 2,3, and 4 chamber views were done to assess for RWMA's. Modified Simpson's rule using a short axis stack was used to calculate an ejection fraction on a dedicated work Research officer, trade [employer]. The patient received 13 cc of Gadavist. After 10 minutes inversion recovery sequences were used to assess for infiltration and scar tissue. Velocity flow mapping performed in the ascending aorta and main pulmonary artery. CONTRAST:  13 cc  of Gadavist FINDINGS: 1. Mildly dilated LV size, Moderate to severe LV basal-septal wall thickness, upto 16 mm in wall thickness. Mild to moderately reduced LV systolic function (LVEF = 43%). There is no LVOT obstruction. There is a small mid wall basal lateral LGE/scar. There is no late gadolinium enhancement in the left ventricular myocardium. LVEDV: 301 ml LVESV: 171 ml SV: 130 ml CO: 4.2  L/min Myocardial mass: 262 g LV native T1 value 1128 ms (normal < 1000 ms) LV ECV value 36% (normal <30%) 2. Mildly dilated right ventricular size, thickness and systolic function (RVEF = 48%). There are no regional wall motion abnormalities. 3.  Mild LA dilation, mild-moderate RA dilation. 4. Normal size of the aortic root, ascending aorta and pulmonary  artery. 5. Mild mitral regurgitation, mild tricuspid regurgitation. no significant valvular abnormalities. 6.  Normal pericardium.  No pericardial effusion. IMPRESSION: 1. Mildly dilated  LV size, mild-moderately reduced LV systolic function. LVEF 43%. 2. Moderate to severe LV basal-septal wall thickness, upto 16 mm in wall thickness. 3. No LVOT obstruction. 4. There is a small mid wall basal lateral LGE/scar. 5. No evidence for amyloidosis. 6. Findings suggest non ischemic cardiomyopathy. Possible inflammatory disease involving the basal lateral wall. Electronically Signed   By: Debbe Odea M.D.   On: 03/30/2023 15:38   MR CARDIAC VELOCITY FLOW MAP Result Date: 03/30/2023 CLINICAL DATA:  LBBB, evaluate EF, amyloidosis EXAM: CARDIAC MRI TECHNIQUE: The patient was scanned on a 1.5 Tesla Siemens magnet. A dedicated cardiac coil was used. Functional imaging was done using Fiesta sequences. 2,3, and 4 chamber views were done to assess for RWMA's. Modified Simpson's rule using a short axis stack was used to calculate an ejection fraction on a dedicated work [employer]. The patient received 13 cc of Gadavist. After 10 minutes inversion recovery sequences were used to assess for infiltration and scar tissue. Velocity flow mapping performed in the ascending aorta and main pulmonary artery. CONTRAST:  13 cc  of Gadavist FINDINGS: 1. Mildly dilated LV size, Moderate to severe LV basal-septal wall thickness, upto 16 mm in wall thickness. Mild to moderately reduced LV systolic function (LVEF = 43%). There is no LVOT obstruction. There is a small mid wall basal lateral LGE/scar. There is no late gadolinium enhancement in the left ventricular myocardium. LVEDV: 301 ml LVESV: 171 ml SV: 130 ml CO: 4.2  L/min Myocardial mass: 262 g LV native T1 value 1128 ms (normal < 1000 ms) LV ECV value 36% (normal <30%) 2. Mildly dilated right ventricular size, thickness and systolic function (RVEF = 48%). There are  no regional wall motion abnormalities. 3.  Mild LA dilation, mild-moderate RA dilation. 4. Normal size of the aortic root, ascending aorta and pulmonary artery. 5. Mild mitral regurgitation, mild tricuspid regurgitation. no significant valvular abnormalities. 6.  Normal pericardium.  No pericardial effusion. IMPRESSION: 1. Mildly dilated LV size, mild-moderately reduced LV systolic function. LVEF 43%. 2. Moderate to severe LV basal-septal wall thickness, upto 16 mm in wall thickness. 3. No LVOT obstruction. 4. There is a small mid wall basal lateral LGE/scar. 5. No evidence for amyloidosis. 6. Findings suggest non ischemic cardiomyopathy. Possible inflammatory disease involving the basal lateral wall. Electronically Signed   By: Debbe Odea M.D.   On: 03/30/2023 15:38   MR CARDIAC VELOCITY FLOW MAP Result Date: 03/30/2023 CLINICAL DATA:  LBBB, evaluate EF, amyloidosis EXAM: CARDIAC MRI TECHNIQUE: The patient was scanned on a 1.5 Tesla Siemens magnet. A dedicated cardiac coil was used. Functional imaging was done using Fiesta sequences. 2,3, and 4 chamber views were done to assess for RWMA's. Modified Simpson's rule using a short axis stack was used to calculate an ejection fraction on a dedicated work [employer], [employer]. The patient received 13 cc of Gadavist. After 10 minutes inversion recovery sequences were used to assess for infiltration and scar tissue. Velocity flow mapping performed in the ascending aorta and main pulmonary artery. CONTRAST:  13 cc  of Gadavist FINDINGS: 1. Mildly dilated LV size, Moderate to severe LV basal-septal wall thickness, upto 16 mm in wall thickness. Mild to moderately reduced LV systolic function (LVEF = 43%). There is no LVOT obstruction. There is a small mid wall basal lateral LGE/scar. There is no late gadolinium enhancement in the left ventricular myocardium. LVEDV: 301 ml LVESV: 171 ml SV: 130 ml CO: 4.2  L/min Myocardial mass: 262 g  LV native T1 value 1128  ms (normal < 1000 ms) LV ECV value 36% (normal <30%) 2. Mildly dilated right ventricular size, thickness and systolic function (RVEF = 48%). There are no regional wall motion abnormalities. 3.  Mild LA dilation, mild-moderate RA dilation. 4. Normal size of the aortic root, ascending aorta and pulmonary artery. 5. Mild mitral regurgitation, mild tricuspid regurgitation. no significant valvular abnormalities. 6.  Normal pericardium.  No pericardial effusion. IMPRESSION: 1. Mildly dilated LV size, mild-moderately reduced LV systolic function. LVEF 43%. 2. Moderate to severe LV basal-septal wall thickness, upto 16 mm in wall thickness. 3. No LVOT obstruction. 4. There is a small mid wall basal lateral LGE/scar. 5. No evidence for amyloidosis. 6. Findings suggest non ischemic cardiomyopathy. Possible inflammatory disease involving the basal lateral wall. Electronically Signed   By: Debbe Odea M.D.   On: 03/30/2023 15:38

## 2023-04-21 NOTE — Progress Notes (Signed)
 Pt refused to stay for 30 minute observation after Venofer injection. Instructed pt to seek medical care if experiencing any adverse reactions.

## 2023-04-21 NOTE — Assessment & Plan Note (Addendum)
 Leukocytosis slightly improved..  ? Reactive, check blood culture.  I will obtain CT chest given his symptom of persistent cough - patient no showed to his CT appt. His PCP has ordered CT chest wo contrast on 4/1

## 2023-04-21 NOTE — Assessment & Plan Note (Addendum)
 Labs are reviewed and discussed with patient. CT chest abdomen pelvis results reviewed and discussed with patient.   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 continue Zanubrutinib 160 mg twice daily

## 2023-04-21 NOTE — Assessment & Plan Note (Signed)
 Encourage oral hydration and avoid nephrotoxins.

## 2023-04-22 ENCOUNTER — Ambulatory Visit

## 2023-04-22 ENCOUNTER — Inpatient Hospital Stay

## 2023-04-25 ENCOUNTER — Encounter: Payer: Self-pay | Admitting: Gastroenterology

## 2023-04-25 ENCOUNTER — Ambulatory Visit: Admitting: Anesthesiology

## 2023-04-25 ENCOUNTER — Ambulatory Visit
Admission: RE | Admit: 2023-04-25 | Discharge: 2023-04-25 | Disposition: A | Discharge status: Home / Self Care | Payer: No Typology Code available for payment source | Attending: Gastroenterology | Admitting: Gastroenterology

## 2023-04-25 ENCOUNTER — Other Ambulatory Visit (HOSPITAL_COMMUNITY): Payer: Self-pay

## 2023-04-25 ENCOUNTER — Encounter
Admission: RE | Disposition: A | Discharge status: Home / Self Care | Payer: Self-pay | Source: Home / Self Care | Attending: Gastroenterology

## 2023-04-25 DIAGNOSIS — G894 Chronic pain syndrome: Secondary | ICD-10-CM | POA: Diagnosis not present

## 2023-04-25 DIAGNOSIS — G4733 Obstructive sleep apnea (adult) (pediatric): Secondary | ICD-10-CM | POA: Diagnosis not present

## 2023-04-25 DIAGNOSIS — Z79890 Hormone replacement therapy (postmenopausal): Secondary | ICD-10-CM | POA: Insufficient documentation

## 2023-04-25 DIAGNOSIS — K640 First degree hemorrhoids: Secondary | ICD-10-CM | POA: Diagnosis not present

## 2023-04-25 DIAGNOSIS — K449 Diaphragmatic hernia without obstruction or gangrene: Secondary | ICD-10-CM | POA: Insufficient documentation

## 2023-04-25 DIAGNOSIS — Z860101 Personal history of adenomatous and serrated colon polyps: Secondary | ICD-10-CM | POA: Diagnosis not present

## 2023-04-25 DIAGNOSIS — D509 Iron deficiency anemia, unspecified: Secondary | ICD-10-CM | POA: Insufficient documentation

## 2023-04-25 DIAGNOSIS — I2510 Atherosclerotic heart disease of native coronary artery without angina pectoris: Secondary | ICD-10-CM | POA: Insufficient documentation

## 2023-04-25 DIAGNOSIS — I129 Hypertensive chronic kidney disease with stage 1 through stage 4 chronic kidney disease, or unspecified chronic kidney disease: Secondary | ICD-10-CM | POA: Insufficient documentation

## 2023-04-25 DIAGNOSIS — N183 Chronic kidney disease, stage 3 unspecified: Secondary | ICD-10-CM | POA: Insufficient documentation

## 2023-04-25 DIAGNOSIS — K5730 Diverticulosis of large intestine without perforation or abscess without bleeding: Secondary | ICD-10-CM | POA: Insufficient documentation

## 2023-04-25 DIAGNOSIS — C9110 Chronic lymphocytic leukemia of B-cell type not having achieved remission: Secondary | ICD-10-CM | POA: Diagnosis not present

## 2023-04-25 DIAGNOSIS — E039 Hypothyroidism, unspecified: Secondary | ICD-10-CM | POA: Diagnosis not present

## 2023-04-25 DIAGNOSIS — Z980 Intestinal bypass and anastomosis status: Secondary | ICD-10-CM | POA: Diagnosis not present

## 2023-04-25 DIAGNOSIS — K635 Polyp of colon: Secondary | ICD-10-CM | POA: Diagnosis not present

## 2023-04-25 DIAGNOSIS — K9189 Other postprocedural complications and disorders of digestive system: Secondary | ICD-10-CM | POA: Diagnosis not present

## 2023-04-25 DIAGNOSIS — K6389 Other specified diseases of intestine: Secondary | ICD-10-CM | POA: Diagnosis not present

## 2023-04-25 DIAGNOSIS — Z1211 Encounter for screening for malignant neoplasm of colon: Secondary | ICD-10-CM | POA: Insufficient documentation

## 2023-04-25 DIAGNOSIS — I130 Hypertensive heart and chronic kidney disease with heart failure and stage 1 through stage 4 chronic kidney disease, or unspecified chronic kidney disease: Secondary | ICD-10-CM | POA: Diagnosis not present

## 2023-04-25 DIAGNOSIS — Z7982 Long term (current) use of aspirin: Secondary | ICD-10-CM | POA: Diagnosis not present

## 2023-04-25 DIAGNOSIS — D124 Benign neoplasm of descending colon: Secondary | ICD-10-CM | POA: Insufficient documentation

## 2023-04-25 DIAGNOSIS — Z79899 Other long term (current) drug therapy: Secondary | ICD-10-CM | POA: Diagnosis not present

## 2023-04-25 DIAGNOSIS — K649 Unspecified hemorrhoids: Secondary | ICD-10-CM | POA: Diagnosis not present

## 2023-04-25 DIAGNOSIS — I447 Left bundle-branch block, unspecified: Secondary | ICD-10-CM | POA: Diagnosis not present

## 2023-04-25 DIAGNOSIS — K621 Rectal polyp: Secondary | ICD-10-CM | POA: Insufficient documentation

## 2023-04-25 DIAGNOSIS — E1122 Type 2 diabetes mellitus with diabetic chronic kidney disease: Secondary | ICD-10-CM | POA: Diagnosis not present

## 2023-04-25 HISTORY — PX: COLONOSCOPY WITH PROPOFOL: SHX5780

## 2023-04-25 HISTORY — PX: POLYPECTOMY: SHX5525

## 2023-04-25 LAB — GLUCOSE, CAPILLARY: Glucose-Capillary: 117 mg/dL — ABNORMAL HIGH (ref 70–99)

## 2023-04-25 SURGERY — COLONOSCOPY WITH PROPOFOL
Anesthesia: General

## 2023-04-25 MED ORDER — PHENYLEPHRINE 80 MCG/ML (10ML) SYRINGE FOR IV PUSH (FOR BLOOD PRESSURE SUPPORT)
PREFILLED_SYRINGE | INTRAVENOUS | Status: AC
  Filled 2023-04-25: qty 10

## 2023-04-25 MED ORDER — SODIUM CHLORIDE 0.9 % IV SOLN
INTRAVENOUS | Status: DC
  Administered 2023-04-25: 20 mL/h via INTRAVENOUS

## 2023-04-25 MED ORDER — LIDOCAINE HCL (PF) 2 % IJ SOLN
INTRAMUSCULAR | Status: AC
Start: 2023-04-25 — End: ?
  Filled 2023-04-25: qty 15

## 2023-04-25 MED ORDER — ONDANSETRON HCL 4 MG/2ML IJ SOLN
INTRAMUSCULAR | Status: AC
  Filled 2023-04-25: qty 2

## 2023-04-25 MED ORDER — LIDOCAINE HCL (CARDIAC) PF 100 MG/5ML IV SOSY
PREFILLED_SYRINGE | INTRAVENOUS | Status: DC | PRN
  Administered 2023-04-25: 50 mg via INTRAVENOUS

## 2023-04-25 MED ORDER — PROPOFOL 1000 MG/100ML IV EMUL
INTRAVENOUS | Status: AC
  Filled 2023-04-25: qty 100

## 2023-04-25 MED ORDER — DEXMEDETOMIDINE HCL IN NACL 80 MCG/20ML IV SOLN
INTRAVENOUS | Status: AC
  Filled 2023-04-25: qty 20

## 2023-04-25 MED ORDER — KETOROLAC TROMETHAMINE 30 MG/ML IJ SOLN
INTRAMUSCULAR | Status: AC
  Filled 2023-04-25: qty 1

## 2023-04-25 MED ORDER — PROPOFOL 10 MG/ML IV BOLUS
INTRAVENOUS | Status: DC | PRN
  Administered 2023-04-25: 50 mg via INTRAVENOUS

## 2023-04-25 MED ORDER — PROPOFOL 500 MG/50ML IV EMUL
INTRAVENOUS | Status: DC | PRN
  Administered 2023-04-25: 75 ug/kg/min via INTRAVENOUS

## 2023-04-25 MED ORDER — DEXMEDETOMIDINE HCL IN NACL 80 MCG/20ML IV SOLN
INTRAVENOUS | Status: DC | PRN
  Administered 2023-04-25: 20 ug via INTRAVENOUS

## 2023-04-25 NOTE — Op Note (Signed)
 [facility] Gastroenterology Patient Name: Paul Briggs Procedure Date: 04/25/2023 8:25 AM MRN: [id number] Account #: [id number] Date of Birth: 09-10-1945 Admit Type: Outpatient Age: 78 Room: [facility] ENDO ROOM 2 Gender: Male Note Status: Supervisor Override Instrument Name: Nelda Marseille [id number] Procedure:             Colonoscopy Indications:           High risk colon cancer surveillance: Personal history                         of adenoma with villous component, Iron deficiency                         anemia Providers:             Trenda Moots, DO Referring MD:          Wilford Corner (Referring MD) Medicines:             Monitored Anesthesia Care Complications:         No immediate complications. Estimated blood loss:                         Minimal. Procedure:             Pre-Anesthesia Assessment:                        - Prior to the procedure, a History and Physical was                         performed, and patient medications and allergies were                         reviewed. The patient is competent. The risks and                         benefits of the procedure and the sedation options and                         risks were discussed with the patient. All questions                         were answered and informed consent was obtained.                         Patient identification and proposed procedure were                         verified by the physician, the nurse, the anesthetist                         and the technician in the endoscopy suite. Mental                         Status Examination: alert and oriented. Airway                         Examination: normal oropharyngeal airway and neck  mobility. Respiratory Examination: clear to                         auscultation. CV Examination: RRR, no murmurs, no S3                         or S4. Prophylactic Antibiotics: The patient does not                          require prophylactic antibiotics. Prior                         Anticoagulants: The patient has taken no anticoagulant                         or antiplatelet agents. ASA Grade Assessment: III - A                         patient with severe systemic disease. After reviewing                         the risks and benefits, the patient was deemed in                         satisfactory condition to undergo the procedure. The                         anesthesia plan was to use monitored anesthesia care                         (MAC). Immediately prior to administration of                         medications, the patient was re-assessed for adequacy                         to receive sedatives. The heart rate, respiratory                         rate, oxygen saturations, blood pressure, adequacy of                         pulmonary ventilation, and response to care were                         monitored throughout the procedure. The physical                         status of the patient was re-assessed after the                         procedure.                        After obtaining informed consent, the colonoscope was                         passed under direct vision. Throughout the procedure,  the patient's blood pressure, pulse, and oxygen                         saturations were monitored continuously. The                         Colonoscope was introduced through the anus and                         advanced to the the ileocolonic anastomosis. The                         colonoscopy was performed without difficulty. The                         patient tolerated the procedure well. The quality of                         the bowel preparation was adequate. The neo-terminal                         ileum, appendiceal orifice and the rectum were                         photographed. Findings:      The perianal and digital rectal examinations were normal.  Pertinent       negatives include normal sphincter tone.      The neo-terminal ileum appeared normal. Estimated blood loss: none.      A 1 to 2 mm polyp was found in the anastomosis. The polyp was sessile.       The polyp was removed with a jumbo cold forceps. Resection and retrieval       were complete. Estimated blood loss was minimal.      Four sessile polyps were found in the rectum (3) and descending colon       (1). The polyps were 1 to 2 mm in size. These polyps were removed with a       jumbo cold forceps. Resection and retrieval were complete. Estimated       blood loss was minimal.      Multiple small-mouthed diverticula were found in the left colon.       Estimated blood loss: none.      Non-bleeding internal hemorrhoids were found during retroflexion. The       hemorrhoids were Grade I (internal hemorrhoids that do not prolapse).       Estimated blood loss: none.      There was evidence of a prior end-to-side ileo-colonic anastomosis at       the hepatic flexure. This was patent and was characterized by healthy       appearing mucosa. The anastomosis was traversed. Estimated blood loss:       none.      The exam was otherwise without abnormality on direct and retroflexion       views. Impression:            - The examined portion of the ileum was normal.                        - One 1 to 2 mm polyp at the anastomosis, removed with  a jumbo cold forceps. Resected and retrieved.                        - Four 1 to 2 mm polyps in the rectum and in the                         descending colon, removed with a jumbo cold forceps.                         Resected and retrieved.                        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                        - Non-bleeding internal hemorrhoids.                        - Patent end-to-side ileo-colonic anastomosis,                         characterized by healthy appearing mucosa.                        - The  examination was otherwise normal on direct and                         retroflexion views. Recommendation:        - Patient has a contact number available for                         emergencies. The signs and symptoms of potential                         delayed complications were discussed with the patient.                         Return to normal activities tomorrow. Written                         discharge instructions were provided to the patient.                        - Discharge patient to home.                        - Resume previous diet.                        - Continue present medications.                        - Await pathology results.                        - Repeat colonoscopy for surveillance based on                         pathology results.                        -  Return to GI office as previously scheduled.                        - Consider repeat video capsule endoscopy                        - The findings and recommendations were discussed with                         the patient. Procedure Code(s):     --- Professional ---                        [phone], Colonoscopy, flexible; with biopsy, single or                         multiple Diagnosis Code(s):     --- Professional ---                        Z86.010, Personal history of colonic polyps                        K64.0, First degree hemorrhoids                        D12.6, Benign neoplasm of colon, unspecified                        D12.8, Benign neoplasm of rectum                        D12.4, Benign neoplasm of descending colon                        Z98.0, Intestinal bypass and anastomosis status                        K57.30, Diverticulosis of large intestine without                         perforation or abscess without bleeding CPT copyright 2022 American Medical Association. All rights reserved. The codes documented in this report are preliminary and upon coder review may  be revised to meet current  compliance requirements. Attending Participation:      I personally performed the entire procedure. Elfredia Nevins, DO Jaynie Collins DO, DO 04/25/2023 9:00:25 AM This report has been signed electronically. Number of Addenda: 0 Note Initiated On: 04/25/2023 8:25 AM Scope Withdrawal Time: 0 hours 13 minutes 52 seconds  Total Procedure Duration: 0 hours 17 minutes 53 seconds  Estimated Blood Loss:  Estimated blood loss was minimal.      [facility]

## 2023-04-25 NOTE — Interval H&P Note (Signed)
 History and Physical Interval Note: Preprocedure H&P from 04/25/23  was reviewed and there was no interval change after seeing and examining the patient.  Written consent was obtained from the patient after discussion of risks, benefits, and alternatives. Patient has consented to proceed with Colonoscopy with possible intervention   04/25/2023 8:19 AM  Paul Ana [name]  has presented today for surgery, with the diagnosis of D50.9 (ICD-10-CM) - Iron deficiency anemia, unspecified iron deficiency anemia type Z86.0101 (ICD-10-CM) - Personal history of adenomatous and serrated colon polyps.  The various methods of treatment have been discussed with the patient and family. After consideration of risks, benefits and other options for treatment, the patient has consented to  Procedure(s) with comments: COLONOSCOPY WITH PROPOFOL (N/A) - DM as a surgical intervention.  The patient's history has been reviewed, patient examined, no change in status, stable for surgery.  I have reviewed the patient's chart and labs.  Questions were answered to the patient's satisfaction.     Jaynie Collins

## 2023-04-25 NOTE — Anesthesia Preprocedure Evaluation (Signed)
 Anesthesia Evaluation  Patient identified by MRN, date of birth, ID band Patient awake    Reviewed: Allergy & Precautions, NPO status , Patient's Chart, lab work & pertinent test results  History of Anesthesia Complications Negative for: history of anesthetic complications  Airway Mallampati: III  TM Distance: <3 FB Neck ROM: full    Dental  (+) Chipped   Pulmonary neg shortness of breath, sleep apnea , former smoker   Pulmonary exam normal        Cardiovascular Exercise Tolerance: Good hypertension, (-) angina + CAD  Normal cardiovascular exam     Neuro/Psych  PSYCHIATRIC DISORDERS       Neuromuscular disease    GI/Hepatic negative GI ROS, Neg liver ROS,,,  Endo/Other  diabetesHypothyroidism    Renal/GU Renal disease  negative genitourinary   Musculoskeletal   Abdominal   Peds  Hematology negative hematology ROS (+)   Anesthesia Other Findings Past Medical History: No date: Adenoma of left adrenal gland No date: Anemia No date: Aortic atherosclerosis (HCC) No date: Blood transfusion without reported diagnosis No date: BPH (benign prostatic hyperplasia) No date: Bradycardia 08/26/2022: CAD (coronary artery disease)     Comment:  a.) cCTA 08/26/2022: Ca2+ 14.3 (12th %'ile for               age/sex.race match control); (<25%) pLAD 08/2022: Cellulitis of left thigh No date: Chronic pain syndrome     Comment:  a.) on COT managed by pain management No date: Chronic, continuous use of opioids     Comment:  a.) chronic pain syndrome/chronic back pain; managed by               pain management No date: CKD (chronic kidney disease) stage 3, GFR 30-59 ml/min (HCC) 12/28/2021: CLL (chronic lymphocytic leukemia) (HCC)     Comment:  a.) Rai stage III No date: DDD (degenerative disc disease), cervical 10/22/2015: Difficult airway     Comment:  a.) 1st attempt with glidescope  --> macroglossic                (unsuccessful); b.) 2nd attempt with Mac 4 and direct               laryngoscopy with a 8.0 ETT --> tube too large to pass               through the cords; b.) 3rd attempt (successful) with a               Mac 4 and a 7.5 tube No date: Diverticulosis No date: DM (diabetes mellitus), type 2 (HCC) No date: Dysplasia of prostate No date: Erectile dysfunction No date: Frequent falls No date: Heart failure with mildly reduced ejection fraction (HFmrEF)  (HCC)     Comment:  a.) TTE 10/23/2015: EF 60-65%, mod-sev LVH, mild               biatrial dil, degen MV disease, AoV sclerosis, asc Ao 38               mm, G1DD; b.) TTE 08/05/2022: EF 45-50%, mod-sev LVH               (speckled pattern), sev biatrial dil, mild MR, AoV               sclerosis, G1DD 08/23/2022: Hepatic flexure mass     Comment:  a.) colonoscopy 08/23/2022: 14 x 15 mm partially  obstructing ulcerated distal ascending colon mass; tissue              friable --> pathology resulted as tubular adenoma,               however felt to be false (-) --> referral to sugery and               colectomy recommended; b.) s/p RIGHT hemicolectomy               09/28/2022 08/28/2022: History of MRSA infection     Comment:  a.) MRSA PCR (+) 08/28/2022; culture from LEFT thigh               abscess No date: HTN (hypertension) No date: Hyperlipidemia No date: Hypogonadism in male No date: Hypothyroidism No date: IDA (iron deficiency anemia) No date: LBBB (left bundle branch block) No date: Long-term use of aspirin therapy No date: Lumbar spinal stenosis No date: Mild cardiomegaly No date: Multiple lung nodules on CT No date: NICM (nonischemic cardiomyopathy) (HCC)     Comment:  a.) TTE 10/23/2015: EF 60-65; b.) TTE 08/05/2022: EF               45-50% 06/16/2013: Orchitis and epididymitis No date: OSA on CPAP 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, hand No date: Pericardial effusion No date: Prostatitis 08/2022: Sepsis  (HCC) 10/22/2015: Subdural hematoma (HCC)     Comment:  a.) s/p traumatic mechanical fall --> CT head               10/22/2015: high-density SDH the left cerebral convexity               with13 mm midline shift --> s/p LEFT frontal burr hole               craniotomy No date: Tubular adenoma of colon No date: Ventral hernia  Past Surgical History: No date: BACK SURGERY 08/21/2022: BIOPSY     Comment:  Procedure: BIOPSY;  Surgeon: Jaynie Collins, DO;               Location: [facility] ENDOSCOPY;  Service: Gastroenterology;; 08/23/2022: BIOPSY     Comment:  Procedure: BIOPSY;  Surgeon: Jaynie Collins, DO;               Location: [facility] ENDOSCOPY;  Service: Gastroenterology;; 02/28/2023: BIOPSY     Comment:  Procedure: BIOPSY;  Surgeon: Jaynie Collins, DO;               Location: [facility] ENDOSCOPY;  Service: Gastroenterology;; 10/22/2015: Ines Bloomer HOLE OF CRANIUM; Left 09/28/2022: COLON SURGERY 08/23/2022: COLONOSCOPY WITH PROPOFOL; N/A     Comment:  Procedure: COLONOSCOPY WITH PROPOFOL;  Surgeon: Jaynie Collins, DO;  Location: [facility] ENDOSCOPY;  Service:               Gastroenterology;  Laterality: N/A; 02/28/2023: COLONOSCOPY WITH PROPOFOL; N/A     Comment:  Procedure: COLONOSCOPY WITH PROPOFOL;  Surgeon: Jaynie Collins, DO;  Location: [facility] ENDOSCOPY;  Service:               Gastroenterology;  Laterality: N/A;  DM 08/21/2022: ESOPHAGOGASTRODUODENOSCOPY (EGD) WITH PROPOFOL; N/A     Comment:  Procedure: ESOPHAGOGASTRODUODENOSCOPY (EGD) WITH               PROPOFOL;  Surgeon:  Jaynie Collins, DO;  Location:              [facility] ENDOSCOPY;  Service: Gastroenterology;  Laterality:               N/A; 02/28/2023: ESOPHAGOGASTRODUODENOSCOPY (EGD) WITH PROPOFOL; N/A     Comment:  Procedure: ESOPHAGOGASTRODUODENOSCOPY (EGD) WITH               PROPOFOL;  Surgeon: Jaynie Collins, DO;  Location:              [facility] ENDOSCOPY;  Service:  Gastroenterology;  Laterality:               N/A; 08/23/2022: GIVENS CAPSULE STUDY; N/A     Comment:  Procedure: GIVENS CAPSULE STUDY;  Surgeon: Jaynie Collins, DO;  Location: [facility] ENDOSCOPY;  Service:               Gastroenterology;  Laterality: N/A; No date: HERNIA REPAIR 03/09/2023: INGUINAL HERNIA REPAIR; Right     Comment:  Procedure: HERNIA REPAIR INGUINAL ADULT, open, RNFA to               assist;  Surgeon: Leafy Ro, MD;  Location: [facility]               ORS;  Service: General;  Laterality: Right; 08/28/2022: IRRIGATION AND DEBRIDEMENT ABSCESS; Left     Comment:  Procedure: IRRIGATION AND DEBRIDEMENT ABSCESS LEFT UPPER              THIGH/GROIN;  Surgeon: Leafy Ro, MD;  Location:               [facility] ORS;  Service: General;  Laterality: Left; 09/28/2022: LAPAROSCOPIC RIGHT COLECTOMY; Right     Comment:  Procedure: LAPAROSCOPIC RIGHT COLECTOMY, RNFA to assist;              Surgeon: Leafy Ro, MD;  Location: [facility] ORS;                Service: General;  Laterality: Right; 08/23/2022: POLYPECTOMY     Comment:  Procedure: POLYPECTOMY;  Surgeon: Jaynie Collins,              DO;  Location: [facility] ENDOSCOPY;  Service:               Gastroenterology;; 08/23/2022: SUBMUCOSAL TATTOO INJECTION     Comment:  Procedure: SUBMUCOSAL TATTOO INJECTION;  Surgeon: Jaynie Collins, DO;  Location: [facility] ENDOSCOPY;  Service:               Gastroenterology;; No date: TONSILLECTOMY 09/28/2022: VENTRAL HERNIA REPAIR; N/A     Comment:  Procedure: HERNIA REPAIR VENTRAL ADULT;  Surgeon: Leafy Ro, MD;  Location: [facility] ORS;  Service: General;                Laterality: N/A;  BMI    Body Mass Index: 28.63 kg/m      Reproductive/Obstetrics negative OB ROS                             Anesthesia Physical Anesthesia Plan  ASA: 3  Anesthesia Plan: General   Post-op Pain Management:    Induction:  Intravenous  PONV Risk Score and Plan: Propofol infusion and TIVA  Airway Management Planned: Natural Airway and Nasal Cannula  Additional Equipment:   Intra-op Plan:   Post-operative Plan:   Informed Consent: I have reviewed the patients History and Physical, chart, labs and discussed the procedure including the risks, benefits and alternatives for the proposed anesthesia with the patient or authorized representative who has indicated his/her understanding and acceptance.     Dental Advisory Given  Plan Discussed with: Anesthesiologist, CRNA and Surgeon  Anesthesia Plan Comments: (Patient consented for risks of anesthesia including but not limited to:  - adverse reactions to medications - risk of airway placement if required - damage to eyes, teeth, lips or other oral mucosa - nerve damage due to positioning  - sore throat or hoarseness - Damage to heart, brain, nerves, lungs, other parts of body or loss of life  Patient voiced understanding and assent.)       Anesthesia Quick Evaluation

## 2023-04-25 NOTE — Progress Notes (Signed)
 Colonic extent, surgical anastomosis reached

## 2023-04-25 NOTE — Transfer of Care (Signed)
 Immediate Anesthesia Transfer of Care Note  Patient: Paul Ralph [name]  Procedure(s) Performed: COLONOSCOPY WITH PROPOFOL POLYPECTOMY  Patient Location: PACU  Anesthesia Type:General  Level of Consciousness: sedated  Airway & Oxygen Therapy: Patient Spontanous Breathing  Post-op Assessment: Report given to RN and Post -op Vital signs reviewed and stable  Post vital signs: Reviewed and stable  Last Vitals:  Vitals Value Taken Time  BP 118/69 04/25/23 0855  Temp 36.1 C 04/25/23 0854  Pulse 69 04/25/23 0856  Resp 22 04/25/23 0856  SpO2 94 % 04/25/23 0856  Vitals shown include unfiled device data.  Last Pain:  Vitals:   04/25/23 0854  TempSrc: Temporal  PainSc: Asleep         Complications: No notable events documented.

## 2023-04-25 NOTE — Anesthesia Postprocedure Evaluation (Signed)
 Anesthesia Post Note  Patient: Paul Santana [name]  Procedure(s) Performed: COLONOSCOPY WITH PROPOFOL POLYPECTOMY  Patient location during evaluation: Endoscopy Anesthesia Type: General Level of consciousness: awake and alert Pain management: pain level controlled Vital Signs Assessment: post-procedure vital signs reviewed and stable Respiratory status: spontaneous breathing, nonlabored ventilation, respiratory function stable and patient connected to nasal cannula oxygen Cardiovascular status: blood pressure returned to baseline and stable Postop Assessment: no apparent nausea or vomiting Anesthetic complications: no   No notable events documented.   Last Vitals:  Vitals:   04/25/23 0904 04/25/23 0914  BP:  (!) 146/89  Pulse:    Resp: 16   Temp:    SpO2: 96%     Last Pain:  Vitals:   04/25/23 0914  TempSrc:   PainSc: 0-No pain                 Cleda Mccreedy Coutney Wildermuth

## 2023-04-25 NOTE — H&P (Signed)
 Pre-Procedure H&P   Patient ID: Paul Blanchfield [name] is a 78 y.o. male.  Gastroenterology Provider: Jaynie Collins, DO  Referring Provider: Tawni Pummel, PA PCP: Wilford Corner, PA-C  Date: 04/25/2023  HPI [name] is a 78 y.o. male who presents today for Colonoscopy for Iron deficiency anemia, history of advanced colon polyps .  Patient with a history of advanced colon polyps and iron deficiency anemia.  He underwent EGD and colonoscopy in July 2024 demonstrating a small hiatal hernia.  On colonoscopy was found to have a tubulovillous adenoma, ulcerated appearing ascending colon mass and is now status post hemicolectomy and multiple other tubular adenomas.   Attempted follow-up colonoscopy in February demonstrated 2 adenomas, however, poor prep limited evaluation.  Given his iron deficiency developing repeating colonoscopy.  Hemoglobin 8.5 MCV 88.8 platelets 245,000 ferritin 24 iron saturation 8   Past Medical History:  Diagnosis Date   Adenoma of left adrenal gland    Anemia    Aortic atherosclerosis (HCC)    Blood transfusion without reported diagnosis    BPH (benign prostatic hyperplasia)    Bradycardia    CAD (coronary artery disease) 08/26/2022   a.) cCTA 08/26/2022: Ca2+ 14.3 (12th %'ile for age/sex.race match control); (<25%) pLAD   Cellulitis of left thigh 08/2022   Chronic pain syndrome    a.) on COT managed by pain management   Chronic, continuous use of opioids    a.) chronic pain syndrome/chronic back pain; managed by pain management   CKD (chronic kidney disease) stage 3, GFR 30-59 ml/min (HCC)    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   a.) Rai stage III   DDD (degenerative disc disease), cervical    Difficult airway 10/22/2015   a.) 1st attempt with glidescope  --> macroglossic (unsuccessful); b.) 2nd attempt with Mac 4 and direct laryngoscopy with a 8.0 ETT --> tube too large to pass through the cords;  b.) 3rd attempt (successful) with a Mac 4 and a 7.5 tube   Diverticulosis    DM (diabetes mellitus), type 2 (HCC)    Dysplasia of prostate    Erectile dysfunction    Frequent falls    Heart failure with mildly reduced ejection fraction (HFmrEF) (HCC)    a.) TTE 10/23/2015: EF 60-65%, mod-sev LVH, mild biatrial dil, degen MV disease, AoV sclerosis, asc Ao 38 mm, G1DD; b.) TTE 08/05/2022: EF 45-50%, mod-sev LVH (speckled pattern), sev biatrial dil, mild MR, AoV sclerosis, G1DD   Hepatic flexure mass 08/23/2022   a.) colonoscopy 08/23/2022: 14 x 15 mm partially obstructing ulcerated distal ascending colon mass; tissue friable --> pathology resulted as tubular adenoma, however felt to be false (-) --> referral to sugery and colectomy recommended; b.) s/p RIGHT hemicolectomy 09/28/2022   History of MRSA infection 08/28/2022   a.) MRSA PCR (+) 08/28/2022; culture from LEFT thigh abscess   HTN (hypertension)    Hyperlipidemia    Hypogonadism in male    Hypothyroidism    IDA (iron deficiency anemia)    LBBB (left bundle branch block)    Long-term use of aspirin therapy    Lumbar spinal stenosis    Mild cardiomegaly    Multiple lung nodules on CT    NICM (nonischemic cardiomyopathy) (HCC)    a.) TTE 10/23/2015: EF 60-65; b.) TTE 08/05/2022: EF 45-50%   Orchitis and epididymitis 06/16/2013   OSA on CPAP    Palindromic rheumatism, hand    Pericardial effusion    Prostatitis  Sepsis (HCC) 08/2022   Subdural hematoma (HCC) 10/22/2015   a.) s/p traumatic mechanical fall --> CT head 10/22/2015: high-density SDH the left cerebral convexity with13 mm midline shift --> s/p LEFT frontal burr hole craniotomy   Tubular adenoma of colon    Ventral hernia     Past Surgical History:  Procedure Laterality Date   BACK SURGERY     BIOPSY  08/21/2022   Procedure: BIOPSY;  Surgeon: Jaynie Collins, DO;  Location: [facility] ENDOSCOPY;  Service: Gastroenterology;;   BIOPSY  08/23/2022   Procedure:  BIOPSY;  Surgeon: Jaynie Collins, DO;  Location: [facility] ENDOSCOPY;  Service: Gastroenterology;;   BIOPSY  02/28/2023   Procedure: BIOPSY;  Surgeon: Jaynie Collins, DO;  Location: [facility] ENDOSCOPY;  Service: Gastroenterology;;   Guss Bunde OF CRANIUM Left 10/22/2015   COLON SURGERY  09/28/2022   COLONOSCOPY WITH PROPOFOL N/A 08/23/2022   Procedure: COLONOSCOPY WITH PROPOFOL;  Surgeon: Jaynie Collins, DO;  Location: [facility] ENDOSCOPY;  Service: Gastroenterology;  Laterality: N/A;   COLONOSCOPY WITH PROPOFOL N/A 02/28/2023   Procedure: COLONOSCOPY WITH PROPOFOL;  Surgeon: Jaynie Collins, DO;  Location: [facility] ENDOSCOPY;  Service: Gastroenterology;  Laterality: N/A;  DM   ESOPHAGOGASTRODUODENOSCOPY (EGD) WITH PROPOFOL N/A 08/21/2022   Procedure: ESOPHAGOGASTRODUODENOSCOPY (EGD) WITH PROPOFOL;  Surgeon: Jaynie Collins, DO;  Location: [facility] ENDOSCOPY;  Service: Gastroenterology;  Laterality: N/A;   ESOPHAGOGASTRODUODENOSCOPY (EGD) WITH PROPOFOL N/A 02/28/2023   Procedure: ESOPHAGOGASTRODUODENOSCOPY (EGD) WITH PROPOFOL;  Surgeon: Jaynie Collins, DO;  Location: [facility] ENDOSCOPY;  Service: Gastroenterology;  Laterality: N/A;   GIVENS CAPSULE STUDY N/A 08/23/2022   Procedure: GIVENS CAPSULE STUDY;  Surgeon: Jaynie Collins, DO;  Location: [facility] ENDOSCOPY;  Service: Gastroenterology;  Laterality: N/A;   HERNIA REPAIR     INGUINAL HERNIA REPAIR Right 03/09/2023   Procedure: HERNIA REPAIR INGUINAL ADULT, open, RNFA to assist;  Surgeon: Leafy Ro, MD;  Location: [facility] ORS;  Service: General;  Laterality: Right;   IRRIGATION AND DEBRIDEMENT ABSCESS Left 08/28/2022   Procedure: IRRIGATION AND DEBRIDEMENT ABSCESS LEFT UPPER THIGH/GROIN;  Surgeon: Leafy Ro, MD;  Location: [facility] ORS;  Service: General;  Laterality: Left;   LAPAROSCOPIC RIGHT COLECTOMY Right 09/28/2022   Procedure: LAPAROSCOPIC RIGHT COLECTOMY, RNFA to assist;  Surgeon: Leafy Ro, MD;  Location: [facility]  ORS;  Service: General;  Laterality: Right;   POLYPECTOMY  08/23/2022   Procedure: POLYPECTOMY;  Surgeon: Jaynie Collins, DO;  Location: [facility] ENDOSCOPY;  Service: Gastroenterology;;   SUBMUCOSAL TATTOO INJECTION  08/23/2022   Procedure: SUBMUCOSAL TATTOO INJECTION;  Surgeon: Jaynie Collins, DO;  Location: [facility] ENDOSCOPY;  Service: Gastroenterology;;   TONSILLECTOMY     VENTRAL HERNIA REPAIR N/A 09/28/2022   Procedure: HERNIA REPAIR VENTRAL ADULT;  Surgeon: Leafy Ro, MD;  Location: [facility] ORS;  Service: General;  Laterality: N/A;    Family History No h/o GI disease or malignancy  Review of Systems  Constitutional:  Negative for activity change, appetite change, chills, diaphoresis, fatigue, fever and unexpected weight change.  HENT:  Negative for trouble swallowing and voice change.   Respiratory:  Negative for shortness of breath and wheezing.   Cardiovascular:  Negative for chest pain, palpitations and leg swelling.  Gastrointestinal:  Negative for abdominal distention, abdominal pain, anal bleeding, blood in stool, constipation, diarrhea, nausea and vomiting.  Musculoskeletal:  Negative for arthralgias and myalgias.  Skin:  Negative for color change and pallor.  Neurological:  Negative for dizziness, syncope and weakness.  Psychiatric/Behavioral:  Negative for confusion. The patient is not nervous/anxious.   All other systems reviewed and are negative.    Medications No current facility-administered medications on file prior to encounter.   Current Outpatient Medications on File Prior to Encounter  Medication Sig Dispense Refill   aspirin EC 81 MG tablet Take 1 tablet (81 mg total) by mouth daily. Swallow whole.     calcium-vitamin D (OSCAL WITH D) 500-5 MG-MCG tablet Take 1 tablet by mouth daily with breakfast.     cyanocobalamin (VITAMIN B12) 1000 MCG tablet Take 1 tablet (1,000 mcg total) by mouth daily. 90 tablet 0   hydrALAZINE (APRESOLINE) 50 MG tablet Take  2 tablets (100 mg total) by mouth 2 (two) times daily. 180 tablet 3   levothyroxine (SYNTHROID) 200 MCG tablet Take 1 tablet (200 mcg total) by mouth daily before breakfast. 90 tablet 0   methocarbamol (ROBAXIN) 500 MG tablet Take 1 tablet (500 mg total) by mouth every 8 (eight) hours as needed for muscle spasms. 30 tablet 0   metoprolol succinate (TOPROL XL) 25 MG 24 hr tablet Take 1 tablet (25 mg total) by mouth daily. 90 tablet 1   rosuvastatin (CRESTOR) 40 MG tablet Take 1 tablet (40 mg total) by mouth daily. 90 tablet 1   metFORMIN (GLUCOPHAGE) 500 MG tablet Take 2 tablets (1,000 mg total) by mouth 2 (two) times daily with a meal. 120 tablet 0    Pertinent medications related to GI and procedure were reviewed by me with the patient prior to the procedure   Current Facility-Administered Medications:    0.9 %  sodium chloride infusion, , Intravenous, Continuous, Jaynie Collins, DO, Last Rate: 20 mL/hr at 04/25/23 0803, 20 mL/hr at 04/25/23 0803  sodium chloride 20 mL/hr (04/25/23 0803)       No Known Allergies Allergies were reviewed by me prior to the procedure  Objective   Body mass index is 28.63 kg/m. Vitals:   04/25/23 0740  BP: (!) 155/83  Pulse: 76  Resp: 20  Temp: (!) 96.6 F (35.9 C)  TempSrc: Temporal  SpO2: 100%  Weight: 98.4 kg  Height: 6\' 1"  (1.854 m)     Physical Exam Vitals and nursing note reviewed.  Constitutional:      General: He is not in acute distress.    Appearance: Normal appearance. He is not ill-appearing, toxic-appearing or diaphoretic.  HENT:     Head: Normocephalic and atraumatic.     Nose: Nose normal.     Mouth/Throat:     Mouth: Mucous membranes are moist.     Pharynx: Oropharynx is clear.     Comments: Poor dentition Eyes:     General: No scleral icterus.    Extraocular Movements: Extraocular movements intact.  Cardiovascular:     Rate and Rhythm: Normal rate and regular rhythm.     Heart sounds: Normal heart sounds. No  murmur heard.    No friction rub. No gallop.  Pulmonary:     Effort: Pulmonary effort is normal. No respiratory distress.     Breath sounds: Normal breath sounds. No wheezing, rhonchi or rales.  Abdominal:     General: Bowel sounds are normal. There is no distension.     Palpations: Abdomen is soft.     Tenderness: There is no abdominal tenderness. There is no guarding or rebound.  Musculoskeletal:     Cervical back: Neck supple.     Right lower leg: No edema.     Left lower leg: No  edema.  Skin:    General: Skin is warm and dry.     Coloration: Skin is not jaundiced or pale.  Neurological:     General: No focal deficit present.     Mental Status: He is alert and oriented to person, place, and time. Mental status is at baseline.  Psychiatric:        Mood and Affect: Mood normal.        Behavior: Behavior normal.        Thought Content: Thought content normal.        Judgment: Judgment normal.      Assessment:  Mr. Atilla Zollner [name] is a 78 y.o. male  who presents today for Colonoscopy for Patient with a history of advanced colon polyps and iron deficiency anemia.    Plan:  Colonoscopy with possible intervention today  Colonoscopy with possible biopsy, control of bleeding, polypectomy, and interventions as necessary has been discussed with the patient/patient representative. Informed consent was obtained from the patient/patient representative after explaining the indication, nature, and risks of the procedure including but not limited to death, bleeding, perforation, missed neoplasm/lesions, cardiorespiratory compromise, and reaction to medications. Opportunity for questions was given and appropriate answers were provided. Patient/patient representative has verbalized understanding is amenable to undergoing the procedure.   Jaynie Collins, DO  [facility] Gastroenterology  Portions of the record may have been created with voice recognition software.  Occasional wrong-word or 'sound-a-like' substitutions may have occurred due to the inherent limitations of voice recognition software.  Read the chart carefully and recognize, using context, where substitutions may have occurred.

## 2023-04-26 ENCOUNTER — Ambulatory Visit

## 2023-04-26 ENCOUNTER — Encounter: Payer: Self-pay | Admitting: Gastroenterology

## 2023-04-26 ENCOUNTER — Ambulatory Visit
Admission: RE | Admit: 2023-04-26 | Discharge: 2023-04-26 | Disposition: A | Discharge status: Home / Self Care | Source: Ambulatory Visit | Attending: Family Medicine | Admitting: Family Medicine

## 2023-04-26 ENCOUNTER — Other Ambulatory Visit

## 2023-04-26 DIAGNOSIS — R053 Chronic cough: Secondary | ICD-10-CM | POA: Insufficient documentation

## 2023-04-26 DIAGNOSIS — R051 Acute cough: Secondary | ICD-10-CM

## 2023-04-26 DIAGNOSIS — I3139 Other pericardial effusion (noninflammatory): Secondary | ICD-10-CM | POA: Diagnosis not present

## 2023-04-26 DIAGNOSIS — R599 Enlarged lymph nodes, unspecified: Secondary | ICD-10-CM | POA: Diagnosis not present

## 2023-04-26 DIAGNOSIS — R918 Other nonspecific abnormal finding of lung field: Secondary | ICD-10-CM | POA: Diagnosis not present

## 2023-04-26 LAB — CULTURE, BLOOD (ROUTINE X 2)
Culture: NO GROWTH
Culture: NO GROWTH
Special Requests: ADEQUATE
Special Requests: ADEQUATE

## 2023-04-26 LAB — SURGICAL PATHOLOGY

## 2023-04-27 ENCOUNTER — Encounter: Payer: Self-pay | Admitting: Oncology

## 2023-04-27 ENCOUNTER — Other Ambulatory Visit: Payer: Self-pay

## 2023-04-27 ENCOUNTER — Ambulatory Visit
Admission: RE | Admit: 2023-04-27 | Discharge: 2023-04-27 | Disposition: A | Discharge status: Home / Self Care | Source: Ambulatory Visit | Attending: Family Medicine | Admitting: Family Medicine

## 2023-04-27 ENCOUNTER — Inpatient Hospital Stay: Attending: Oncology

## 2023-04-27 ENCOUNTER — Other Ambulatory Visit: Payer: Self-pay | Admitting: Family Medicine

## 2023-04-27 ENCOUNTER — Ambulatory Visit

## 2023-04-27 ENCOUNTER — Inpatient Hospital Stay

## 2023-04-27 VITALS — BP 128/73 | HR 82 | Temp 98.0°F | Resp 18

## 2023-04-27 DIAGNOSIS — I3139 Other pericardial effusion (noninflammatory): Secondary | ICD-10-CM | POA: Diagnosis not present

## 2023-04-27 DIAGNOSIS — R079 Chest pain, unspecified: Secondary | ICD-10-CM

## 2023-04-27 DIAGNOSIS — T17908A Unspecified foreign body in respiratory tract, part unspecified causing other injury, initial encounter: Secondary | ICD-10-CM

## 2023-04-27 DIAGNOSIS — D509 Iron deficiency anemia, unspecified: Secondary | ICD-10-CM | POA: Diagnosis not present

## 2023-04-27 DIAGNOSIS — J90 Pleural effusion, not elsewhere classified: Secondary | ICD-10-CM | POA: Diagnosis not present

## 2023-04-27 DIAGNOSIS — C9110 Chronic lymphocytic leukemia of B-cell type not having achieved remission: Secondary | ICD-10-CM

## 2023-04-27 DIAGNOSIS — R918 Other nonspecific abnormal finding of lung field: Secondary | ICD-10-CM | POA: Diagnosis not present

## 2023-04-27 DIAGNOSIS — R053 Chronic cough: Secondary | ICD-10-CM | POA: Diagnosis not present

## 2023-04-27 DIAGNOSIS — Z79899 Other long term (current) drug therapy: Secondary | ICD-10-CM | POA: Insufficient documentation

## 2023-04-27 LAB — CBC WITH DIFFERENTIAL (CANCER CENTER ONLY)
Abs Immature Granulocytes: 0.1 10*3/uL — ABNORMAL HIGH (ref 0.00–0.07)
Basophils Absolute: 0.1 10*3/uL (ref 0.0–0.1)
Basophils Relative: 1 %
Eosinophils Absolute: 0.3 10*3/uL (ref 0.0–0.5)
Eosinophils Relative: 2 %
HCT: 28.4 % — ABNORMAL LOW (ref 39.0–52.0)
Hemoglobin: 8.4 g/dL — ABNORMAL LOW (ref 13.0–17.0)
Immature Granulocytes: 1 %
Lymphocytes Relative: 52 %
Lymphs Abs: 6.9 10*3/uL — ABNORMAL HIGH (ref 0.7–4.0)
MCH: 26.1 pg (ref 26.0–34.0)
MCHC: 29.6 g/dL — ABNORMAL LOW (ref 30.0–36.0)
MCV: 88.2 fL (ref 80.0–100.0)
Monocytes Absolute: 0.5 10*3/uL (ref 0.1–1.0)
Monocytes Relative: 4 %
Neutro Abs: 5.3 10*3/uL (ref 1.7–7.7)
Neutrophils Relative %: 40 %
Platelet Count: 278 10*3/uL (ref 150–400)
RBC: 3.22 MIL/uL — ABNORMAL LOW (ref 4.22–5.81)
RDW: 20.2 % — ABNORMAL HIGH (ref 11.5–15.5)
Smear Review: NORMAL
WBC Count: 13.1 10*3/uL — ABNORMAL HIGH (ref 4.0–10.5)
nRBC: 0 % (ref 0.0–0.2)

## 2023-04-27 LAB — RETIC PANEL
Immature Retic Fract: 13.7 % (ref 2.3–15.9)
RBC.: 3.19 MIL/uL — ABNORMAL LOW (ref 4.22–5.81)
Retic Count, Absolute: 47.5 10*3/uL (ref 19.0–186.0)
Retic Ct Pct: 1.5 % (ref 0.4–3.1)
Reticulocyte Hemoglobin: 22.7 pg — ABNORMAL LOW (ref >27.9)

## 2023-04-27 LAB — SAMPLE TO BLOOD BANK

## 2023-04-27 MED ORDER — IRON SUCROSE 20 MG/ML IV SOLN
200.0000 mg | Freq: Once | INTRAVENOUS | Status: AC
  Administered 2023-04-27: 200 mg via INTRAVENOUS

## 2023-04-27 MED ORDER — IOHEXOL 350 MG/ML SOLN
75.0000 mL | Freq: Once | INTRAVENOUS | Status: AC | PRN
Start: 2023-04-27 — End: 2023-04-27
  Administered 2023-04-27: 75 mL via INTRAVENOUS

## 2023-04-27 NOTE — Assessment & Plan Note (Signed)
 Continue treatment as mentioned above.

## 2023-04-27 NOTE — Patient Instructions (Signed)

## 2023-04-28 ENCOUNTER — Inpatient Hospital Stay

## 2023-04-28 ENCOUNTER — Encounter: Payer: Self-pay | Admitting: Oncology

## 2023-04-28 ENCOUNTER — Inpatient Hospital Stay (HOSPITAL_BASED_OUTPATIENT_CLINIC_OR_DEPARTMENT_OTHER): Admitting: Oncology

## 2023-04-28 VITALS — BP 139/85 | HR 64 | Temp 97.0°F | Resp 18 | Wt 226.6 lb

## 2023-04-28 DIAGNOSIS — D509 Iron deficiency anemia, unspecified: Secondary | ICD-10-CM

## 2023-04-28 DIAGNOSIS — C9110 Chronic lymphocytic leukemia of B-cell type not having achieved remission: Secondary | ICD-10-CM

## 2023-04-28 DIAGNOSIS — R053 Chronic cough: Secondary | ICD-10-CM | POA: Diagnosis not present

## 2023-04-28 DIAGNOSIS — J189 Pneumonia, unspecified organism: Secondary | ICD-10-CM

## 2023-04-28 DIAGNOSIS — J449 Chronic obstructive pulmonary disease, unspecified: Secondary | ICD-10-CM | POA: Diagnosis not present

## 2023-04-28 DIAGNOSIS — J310 Chronic rhinitis: Secondary | ICD-10-CM | POA: Diagnosis not present

## 2023-04-28 NOTE — Assessment & Plan Note (Addendum)
 Labs are reviewed and discussed with patient. CT chest abdomen pelvis results reviewed and discussed with patient.   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 Recommend patient to hold off Zanubrutinib 160 mg twice daily due to pneumonia.

## 2023-04-28 NOTE — Progress Notes (Signed)
 Pt here for follow up. Reports that he started taking Levaquin for pneumonia. Pt states that he has not taken BP medication because he rand out of it. Advised him to contact PCP for a refill.

## 2023-04-28 NOTE — Assessment & Plan Note (Signed)
 Has been started on Levaquin. Recommend patient to follow up with PCP

## 2023-04-28 NOTE — Assessment & Plan Note (Addendum)
 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 Lab Results  Component Value Date   HGB 8.4 (L) 04/27/2023   TIBC 336 04/11/2023   IRONPCTSAT 8 (L) 04/11/2023   FERRITIN 24 04/11/2023   recurrent iron deficiency anemia.  Rapid drop of hemoglobin. Received Venofer 200 mg yesterday.  Colonoscopy findings were reviewed. No obvious source of bleeding.  He will get weekly monitoring of hemoglobin and venofer treatments.

## 2023-04-28 NOTE — Progress Notes (Signed)
 Hematology/Oncology Progress note [phone] [phone]        ASSESSMENT & PLAN:   Cancer Staging  CLL (chronic lymphocytic leukemia) (HCC) Staging form: Chronic Lymphocytic Leukemia / Small Lymphocytic Lymphoma, AJCC 8th Edition - Clinical stage from 12/28/2021: Modified Rai Stage III (Modified Rai risk: High, Lymphocytosis: Present, Adenopathy: Present, Organomegaly: Absent, Anemia: Present, Thrombocytopenia: Absent) - Signed by [name], MD on 01/20/2022   CLL (chronic lymphocytic leukemia) [facility]) Labs are reviewed and discussed with patient. CT chest abdomen pelvis results reviewed and discussed with patient.   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 Recommend patient to hold off Zanubrutinib 160 mg twice daily due to pneumonia.   IDA (iron deficiency anemia) Anemia is likely multifactorial, could be due to CLL, anemia due to chronic kidney disease and IDA. Lab Results  Component Value Date   HGB 8.4 (L) 04/27/2023   TIBC 336 04/11/2023   IRONPCTSAT 8 (L) 04/11/2023   FERRITIN 24 04/11/2023   recurrent iron deficiency anemia.  Rapid drop of hemoglobin. Received Venofer 200 mg yesterday.  Colonoscopy findings were reviewed. No obvious source of bleeding.  He will get weekly monitoring of hemoglobin and venofer treatments.      Pneumonia Has been started on Levaquin. Recommend patient to follow up with PCP  Orders Placed This Encounter  Procedures   CMP (Cancer Center only)    Standing Status:   Future    Expected Date:   05/12/2023    Expiration Date:   04/27/2024   CBC with Differential (Cancer Center Only)    Standing Status:   Future    Expected Date:   05/12/2023    Expiration Date:   04/27/2024   Hemoglobin and Hematocrit (Cancer Center Only)    Standing Status:   Future    Expected Date:   05/05/2023    Expiration Date:   04/27/2024   Sample to Blood Bank     Standing Status:   Future    Expected Date:   05/12/2023    Expiration Date:   04/27/2024   Sample to Blood Bank    Standing Status:   Future    Expected Date:   05/05/2023    Expiration Date:   04/27/2024   Follow up 1 week lab + venofer.  2 weeks lab Md Venofer  All questions were answered. The patient knows to call the clinic with any problems, questions or concerns.  [name], MD, PhD [facility] Hematology Oncology 04/28/2023    CHIEF COMPLAINTS/PURPOSE OF CONSULTATION:  CLL  HISTORY OF PRESENTING ILLNESS:  Paul Ana [name] 78 y.o. male presents for follow up of CLL I have reviewed his chart and materials related to his cancer extensively and collaborated history with the patient. Summary of oncologic history is as follows: Oncology History  CLL (chronic lymphocytic leukemia) (HCC)  12/25/2021 - 12/26/2021 Hospital Admission   Patient was admitted due to pain and swelling of right ankle, right knee and left wrist.  Patient was found to have WBC 30.4, with neutrophil 29% and lymphocyte 2%.  Peripheral smear showed leukocytosis with slight left shift in myeloid series and lymphocytosis with abnormal morphology.  Uric acid 5.5, worsening renal function with creatinine 1.35, BUN 18, GFR 55 (baseline creatinine 1.08 on 07/06/2021), lactic acid 1.1.  ESR normal, uric acid 5.5 normal, CRP 22.2, LDH 182  Orthopedic surgery was consulted s/p arthrocentesis, cell count 296 with 72%  neutrophil, no crystal. Fluid negative for growth.  Patient received cefepime and vancomycin while in the hospital, Patient was discharged on Keflex for 5 days.   12/28/2021 Cancer Staging   Staging form: Chronic Lymphocytic Leukemia / Small Lymphocytic Lymphoma, AJCC 8th Edition - Clinical stage from 12/28/2021: Modified Rai Stage III (Modified Rai risk: High, Lymphocytosis: Present, Adenopathy: Present, Organomegaly: Absent, Anemia: Present, Thrombocytopenia: Absent) - Signed by [name], MD on  01/20/2022 Stage prefix: Initial diagnosis Hemoglobin (Hgb) (g/dL): 96.0   45/04/979 Initial Diagnosis   CLL (chronic lymphocytic leukemia)   12/27/21 peripheral blood flowcytometry showed Involvement by CD5+, CD23+, CD20+, CD22+ clonal B cell population, phenotype typical for 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    01/05/2022 Imaging   CT chest abdomen pelvis wo contrast 1. Multiple prominent borderline enlarged and mildly enlarged lymph nodes, most evident in the low anatomic pelvis, as above, compatible with reported clinical history of CLL. 2. There also several small pulmonary nodules in the lungs measuring 5 mm or less in size. This is nonspecific, but statistically likely benign. No follow-up needed if patient is low-risk (and has no known or suspected primary neoplasm). Non-contrast chest CT can be considered in 12 months if patient is high-risk. This recommendation 3. Aortic atherosclerosis, in addition to left main and 2 vessel coronary artery disease. Please note that although the presence of coronary artery calcium documents the presence of coronary artery disease, the severity of this disease and any potential stenosis cannot be assessed on this non-gated CT examination. Assessment for  potential risk factor modification, dietary therapy or pharmacologic therapy may be warranted, if clinically indicated. 4. There are calcifications of the aortic valve. Echocardiographic correlation for evaluation of potential valvular dysfunction may be warranted if clinically indicated. 5. 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 6. Diverticulosis without evidence of acute diverticulitis at this time. 7. Mild cardiomegaly.   08/26/2022 Imaging   CT abdomen pelvis w contrast  1. Subcutaneous fat stranding within the left  lower quadrant anterior abdominal wall extending into the anterior proximal left thigh, likely related to contusion given history of recent trauma. No fluid collection or hematoma. 2. No acute displaced fracture. 3. Retroperitoneal and pelvic lymphadenopathy as above, with waxing and waning appearance. 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 4. 1.9 cm hypodensity within the medial aspect of the spleen,nonspecific. Leukemic involvement of the spleen cannot be excluded. 5. Distal colonic diverticulosis without diverticulitis. 6.  Aortic Atherosclerosis      08/23/2022 Colonoscopy showed - Likely malignant partially obstructing tumor in the distal ascending colon. Biopsied. Tattooed. - Three 4 to 6 mm polyps in the transverse colon and in the ascending colon, removed with a cold snare. Resected and retrieved. - Five 3 to 7 mm polyps in the rectum, in the descending colon, in the transverse colon and in the ascending colon, removed with a hot snare. Resected and retrieved. 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 - Non- bleeding internal hemorrhoids.  Pathology showed 1. Ascending Colon Polyp, x2 cold snare - TUBULAR ADENOMA(S) (MULTIPLE FRAGMENTS) - NEGATIVE FOR HIGH-GRADE DYSPLASIA OR MALIGNANCY 2. Ascending Colon Polyp, hot snare - TUBULAR ADENOMA (MULTIPLE FRAGMENTS) - NEGATIVE FOR HIGH-GRADE DYSPLASIA OR MALIGNANCY 3. Ascending Colon Biopsy, mass cbx - TUBULAR ADENOMA (MULTIPLE FRAGMENTS) - SEE NOTE 4. Transverse Colon Polyp, hot snare; cold snare - TUBULAR ADENOMA (MULTIPLE FRAGMENTS) -  NEGATIVE FOR HIGH-GRADE DYSPLASIA OR MALIGNANCY 5. Descending Colon Polyp, x2 hot snare - TUBULAR ADENOMA(S) (MULTIPLE FRAGMENTS) - NEGATIVE FOR HIGH-GRADE DYSPLASIA OR MALIGNANCY 6. Rectum, polyp(s), hot snare - TUBULOVILLOUS ADENOMA (MULTIPLE FRAGMENTS) - NEGATIVE FOR HIGH-GRADE DYSPLASIA OR MALIGNANCY I&D Recent admission due to sepsis due to cellulitis, left groin abscess, s/p  I&D    INTERVAL HISTORY Paul Bress [name] is a 78 y.o. male who has above history reviewed by me today presents for follow up visit for CLL, iron deficiency anemia, colon tubular adenoma with high-grade dysplastic status post resection.  04/24/2023 s/p colonoscopy, removal of small polyps.  04/26/2023 CT chest wo contrast  Pulmonary infiltrates as described and left pleural effusion correlate with bronchopneumonia. Follow-up recommended as clinically needed. Small pericardial effusion, correlation with echocardiography recommended as clinically needed.  04/27/2023 CT chest PE angiogram  No definite evidence of pulmonary embolus. Small left pleural effusion is noted. Airspace opacities are noted in lingular segment of left upper lobe as well as in left lower lobe most consistent with pneumonia. Minimal pericardial effusion. Aortic Atherosclerosis  He was recommended to start Levaquin and Lasix.  Today he feels slightly better.  + fatigue   MEDICAL HISTORY:  Past Medical History:  Diagnosis Date   Adenoma of left adrenal gland    Anemia    Aortic atherosclerosis (HCC)    Blood transfusion without reported diagnosis    BPH (benign prostatic hyperplasia)    Bradycardia    CAD (coronary artery disease) 08/26/2022   a.) cCTA 08/26/2022: Ca2+ 14.3 (12th %'ile for age/sex.race match control); (<25%) pLAD   Cellulitis of left thigh 08/2022   Chronic pain syndrome    a.) on COT managed by pain management   Chronic, continuous use of opioids    a.) chronic pain syndrome/chronic back pain; managed by pain management   CKD (chronic kidney disease) stage 3, GFR 30-59 ml/min (HCC)    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   a.) Rai stage III   DDD (degenerative disc disease), cervical    Difficult airway 10/22/2015   a.) 1st attempt with glidescope  --> macroglossic (unsuccessful); b.) 2nd attempt with Mac 4 and direct laryngoscopy with a 8.0 ETT --> tube too large to  pass through the cords; b.) 3rd attempt (successful) with a Mac 4 and a 7.5 tube   Diverticulosis    DM (diabetes mellitus), type 2 (HCC)    Dysplasia of prostate    Erectile dysfunction    Frequent falls    Heart failure with mildly reduced ejection fraction (HFmrEF) (HCC)    a.) TTE 10/23/2015: EF 60-65%, mod-sev LVH, mild biatrial dil, degen MV disease, AoV sclerosis, asc Ao 38 mm, G1DD; b.) TTE 08/05/2022: EF 45-50%, mod-sev LVH (speckled pattern), sev biatrial dil, mild MR, AoV sclerosis, G1DD   Hepatic flexure mass 08/23/2022   a.) colonoscopy 08/23/2022: 14 x 15 mm partially obstructing ulcerated distal ascending colon mass; tissue friable --> pathology resulted as tubular adenoma, however felt to be false (-) --> referral to sugery and colectomy recommended; b.) s/p RIGHT hemicolectomy 09/28/2022   History of MRSA infection 08/28/2022   a.) MRSA PCR (+) 08/28/2022; culture from LEFT thigh abscess   HTN (hypertension)    Hyperlipidemia    Hypogonadism in male    Hypothyroidism    IDA (iron deficiency anemia)    LBBB (left bundle branch block)    Long-term use of aspirin therapy    Lumbar spinal stenosis    Mild cardiomegaly  Multiple lung nodules on CT    NICM (nonischemic cardiomyopathy) (HCC)    a.) TTE 10/23/2015: EF 60-65; b.) TTE 08/05/2022: EF 45-50%   Orchitis and epididymitis 06/16/2013   OSA on CPAP    Palindromic rheumatism, hand    Pericardial effusion    Prostatitis    Sepsis (HCC) 08/2022   Subdural hematoma (HCC) 10/22/2015   a.) s/p traumatic mechanical fall --> CT head 10/22/2015: high-density SDH the left cerebral convexity with13 mm midline shift --> s/p LEFT frontal burr hole craniotomy   Tubular adenoma of colon    Ventral hernia     SURGICAL HISTORY: Past Surgical History:  Procedure Laterality Date   BACK SURGERY     BIOPSY  08/21/2022   Procedure: BIOPSY;  Surgeon: Jaynie Collins, DO;  Location: [facility] ENDOSCOPY;  Service:  Gastroenterology;;   BIOPSY  08/23/2022   Procedure: BIOPSY;  Surgeon: Jaynie Collins, DO;  Location: [facility] ENDOSCOPY;  Service: Gastroenterology;;   BIOPSY  02/28/2023   Procedure: BIOPSY;  Surgeon: Jaynie Collins, DO;  Location[facility] ENDOSCOPY;  Service: Gastroenterology;;   Guss Bunde OF CRANIUM Left 10/22/2015   COLON SURGERY  09/28/2022   COLONOSCOPY WITH PROPOFOL N/A 08/23/2022   Procedure: COLONOSCOPY WITH PROPOFOL;  Surgeon: Jaynie Collins, DO;  Location: [facility] ENDOSCOPY;  Service: Gastroenterology;  Laterality: N/A;   COLONOSCOPY WITH PROPOFOL N/A 02/28/2023   Procedure: COLONOSCOPY WITH PROPOFOL;  Surgeon: Jaynie Collins, DO;  Location: [facility] ENDOSCOPY;  Service: Gastroenterology;  Laterality: N/A;  DM   COLONOSCOPY WITH PROPOFOL N/A 04/25/2023   Procedure: COLONOSCOPY WITH PROPOFOL;  Surgeon: Jaynie Collins, DO;  Location: [facility] ENDOSCOPY;  Service: Gastroenterology;  Laterality: N/A;  DM   ESOPHAGOGASTRODUODENOSCOPY (EGD) WITH PROPOFOL N/A 08/21/2022   Procedure: ESOPHAGOGASTRODUODENOSCOPY (EGD) WITH PROPOFOL;  Surgeon: Jaynie Collins, DO;  Location: [facility] ENDOSCOPY;  Service: Gastroenterology;  Laterality: N/A;   ESOPHAGOGASTRODUODENOSCOPY (EGD) WITH PROPOFOL N/A 02/28/2023   Procedure: ESOPHAGOGASTRODUODENOSCOPY (EGD) WITH PROPOFOL;  Surgeon: Jaynie Collins, DO;  Location: [facility] ENDOSCOPY;  Service: Gastroenterology;  Laterality: N/A;   GIVENS CAPSULE STUDY N/A 08/23/2022   Procedure: GIVENS CAPSULE STUDY;  Surgeon: Jaynie Collins, DO;  Location: [facility] ENDOSCOPY;  Service: Gastroenterology;  Laterality: N/A;   HERNIA REPAIR     INGUINAL HERNIA REPAIR Right 03/09/2023   Procedure: HERNIA REPAIR INGUINAL ADULT, open, RNFA to assist;  Surgeon: Leafy Ro, MD;  Location: [facility] ORS;  Service: General;  Laterality: Right;   IRRIGATION AND DEBRIDEMENT ABSCESS Left 08/28/2022   Procedure: IRRIGATION AND DEBRIDEMENT ABSCESS LEFT UPPER  THIGH/GROIN;  Surgeon: Leafy Ro, MD;  Location: [facility] ORS;  Service: General;  Laterality: Left;   LAPAROSCOPIC RIGHT COLECTOMY Right 09/28/2022   Procedure: LAPAROSCOPIC RIGHT COLECTOMY, RNFA to assist;  Surgeon: Leafy Ro, MD;  Location: [facility] ORS;  Service: General;  Laterality: Right;   POLYPECTOMY  08/23/2022   Procedure: POLYPECTOMY;  Surgeon: Jaynie Collins, DO;  Location: [facility] ENDOSCOPY;  Service: Gastroenterology;;   POLYPECTOMY  04/25/2023   Procedure: POLYPECTOMY;  Surgeon: Jaynie Collins, DO;  Location: [facility] ENDOSCOPY;  Service: Gastroenterology;;   SUBMUCOSAL TATTOO INJECTION  08/23/2022   Procedure: SUBMUCOSAL TATTOO INJECTION;  Surgeon: Jaynie Collins, DO;  Location: [facility] ENDOSCOPY;  Service: Gastroenterology;;   TONSILLECTOMY     VENTRAL HERNIA REPAIR N/A 09/28/2022   Procedure: HERNIA REPAIR VENTRAL ADULT;  Surgeon: Leafy Ro, MD;  Location: [facility] ORS;  Service: General;  Laterality: N/A;    SOCIAL  HISTORY: Social History   Socioeconomic History   Marital status: Married    Spouse name: Not on file   Number of children: Not on file   Years of education: Not on file   Highest education level: 5th grade  Occupational History   Not on file  Tobacco Use   Smoking status: Former    Types: Cigarettes    Passive exposure: Past   Smokeless tobacco: Never   Tobacco comments:    Stop smoking iver 40  plus years ago  Vaping Use   Vaping status: Never Used  Substance and Sexual Activity   Alcohol use: No   Drug use: No   Sexual activity: Not Currently  Other Topics Concern   Not on file  Social History Narrative   Not on file   Social Drivers of Health   Financial Resource Strain: Patient Declined (04/26/2023)   Received from [facility]   Overall Financial Resource Strain (CARDIA)    Difficulty of Paying Living Expenses: Patient declined  Recent Concern: Financial Resource Strain - Medium Risk (02/24/2023)    Received from [facility]   Overall Financial Resource Strain (CARDIA)    Difficulty of Paying Living Expenses: Somewhat hard  Food Insecurity: Patient Declined (04/26/2023)   Received from [facility]   Hunger Vital Sign    Worried About Running Out of Food in the Last Year: Patient declined    Ran Out of Food in the Last Year: Patient declined  Recent Concern: Food Insecurity - Food Insecurity Present (02/24/2023)   Received from [facility]   Hunger Vital Sign    Worried About Running Out of Food in the Last Year: Sometimes true    Ran Out of Food in the Last Year: Sometimes true  Transportation Needs: No Transportation Needs (04/26/2023)   Received from [facility] - Transportation    In the past 12 months, has lack of transportation kept you from medical appointments or from getting medications?: No    Lack of Transportation (Non-Medical): No  Physical Activity: Unknown (07/26/2022)   Exercise Vital Sign    Days of Exercise per Week: Patient declined    Minutes of Exercise per Session: Not on file  Stress: Stress Concern Present (07/26/2022)   [employer] of Occupational Health - Occupational Stress Questionnaire    Feeling of Stress : To some extent  Social Connections: Socially Integrated (12/20/2022)   Social Connection and Isolation Panel [NHANES]    Frequency of Communication with Friends and Family: More than three times a week    Frequency of Social Gatherings with Friends and Family: Patient declined    Attends Religious Services: More than 4 times per year    Active Member of [employer] or Organizations: Yes    Attends Banker Meetings: Never    Marital Status: Married  Catering manager Violence: Not At Risk (09/28/2022)   Humiliation, Afraid, Rape, and Kick questionnaire    Fear of Current or Ex-Partner: No    Emotionally Abused: No    Physically Abused: No    Sexually Abused: No     FAMILY HISTORY: Family History  Problem Relation Age of Onset   Cancer Maternal Aunt    ADD / ADHD Daughter    Asthma Son    Prostate cancer Neg Hx    Kidney disease Neg Hx    Kidney cancer Neg Hx    Bladder Cancer Neg  Hx     ALLERGIES:  has no known allergies.  MEDICATIONS:  Current Outpatient Medications  Medication Sig Dispense Refill   aspirin EC 81 MG tablet Take 1 tablet (81 mg total) by mouth daily. Swallow whole.     calcium-vitamin D (OSCAL WITH D) 500-5 MG-MCG tablet Take 1 tablet by mouth daily with breakfast.     cyanocobalamin (VITAMIN B12) 1000 MCG tablet Take 1 tablet (1,000 mcg total) by mouth daily. 90 tablet 0   furosemide (LASIX) 20 MG tablet Take 20 mg by mouth daily.     hydrALAZINE (APRESOLINE) 50 MG tablet Take 2 tablets (100 mg total) by mouth 2 (two) times daily. 180 tablet 3   levofloxacin (LEVAQUIN) 500 MG tablet Take 500 mg by mouth daily.     levothyroxine (SYNTHROID) 200 MCG tablet Take 1 tablet (200 mcg total) by mouth daily before breakfast. 90 tablet 0   metFORMIN (GLUCOPHAGE) 500 MG tablet Take 2 tablets (1,000 mg total) by mouth 2 (two) times daily with a meal. 120 tablet 0   methocarbamol (ROBAXIN) 500 MG tablet Take 1 tablet (500 mg total) by mouth every 8 (eight) hours as needed for muscle spasms. 30 tablet 0   oxyCODONE-acetaminophen (PERCOCET) 10-325 MG tablet Take 1 tablet by mouth every 8 (eight) hours as needed for pain. Must last 30 days. 90 tablet 0   rosuvastatin (CRESTOR) 40 MG tablet Take 1 tablet (40 mg total) by mouth daily. 90 tablet 1   zanubrutinib (BRUKINSA) 80 MG capsule Take 2 capsules (160 mg total) by mouth 2 (two) times daily. 120 capsule 2   metoprolol succinate (TOPROL XL) 25 MG 24 hr tablet Take 1 tablet (25 mg total) by mouth daily. (Patient not taking: Reported on 04/28/2023) 90 tablet 1   [START ON 05/16/2023] oxyCODONE-acetaminophen (PERCOCET) 10-325 MG tablet Take 1 tablet by mouth every 8 (eight) hours as needed for  pain. Must last 30 days. (Patient not taking: Reported on 04/28/2023) 90 tablet 0   No current facility-administered medications for this visit.    Review of Systems  Constitutional:  Positive for fatigue. Negative for appetite change, chills, fever and unexpected weight change.  HENT:   Negative for hearing loss and voice change.   Eyes:  Negative for eye problems and icterus.  Respiratory:  Positive for cough. Negative for chest tightness and shortness of breath.   Cardiovascular:  Negative for chest pain and leg swelling.  Gastrointestinal:  Negative for abdominal distention and abdominal pain.  Endocrine: Negative for hot flashes.  Genitourinary:  Negative for difficulty urinating, dysuria and frequency.   Musculoskeletal:  Positive for arthralgias.  Skin:  Negative for itching and rash.  Neurological:  Negative for light-headedness and numbness.  Hematological:  Negative for adenopathy. Does not bruise/bleed easily.  Psychiatric/Behavioral:  Negative for confusion.      PHYSICAL EXAMINATION: ECOG PERFORMANCE STATUS: 1 - Symptomatic but completely ambulatory  Vitals:   04/28/23 0852  BP: 139/85  Pulse: 64  Resp: 18  Temp: (!) 97 F (36.1 C)  SpO2: 98%   Filed Weights   04/28/23 0852  Weight: 226 lb 9.6 oz (102.8 kg)    Physical Exam Constitutional:      General: He is not in acute distress.    Appearance: He is obese. He is not diaphoretic.  HENT:     Head: Normocephalic and atraumatic.  Eyes:     General: No scleral icterus.    Pupils: Pupils are equal, round, and reactive to  light.  Cardiovascular:     Rate and Rhythm: Normal rate.     Heart sounds: No murmur heard. Pulmonary:     Effort: Pulmonary effort is normal. No respiratory distress.     Breath sounds: No wheezing.  Abdominal:     General: There is no distension.     Palpations: Abdomen is soft.  Genitourinary:    Comments: S/p right inguinal hernia repair, scarring tissue, no erythematous changes  or discharge.  Musculoskeletal:     Cervical back: Normal range of motion and neck supple.  Skin:    General: Skin is warm and dry.     Findings: No erythema.  Neurological:     Mental Status: He is alert and oriented to person, place, and time. Mental status is at baseline.     Cranial Nerves: No cranial nerve deficit.     Motor: No abnormal muscle tone.  Psychiatric:        Mood and Affect: Mood and affect normal.    Maurine Cane served as chaperon during physical examination.   LABORATORY DATA:  I have reviewed the data as listed    Latest Ref Rng & Units 04/27/2023   12:58 PM 04/21/2023    1:04 PM 04/11/2023   11:41 AM  CBC  WBC 4.0 - 10.5 K/uL 13.1  19.7  22.2   Hemoglobin 13.0 - 17.0 g/dL 8.4  7.8  8.5   Hematocrit 39.0 - 52.0 % 28.4  25.7  27.8   Platelets 150 - 400 K/uL 278  210  145       Latest Ref Rng & Units 04/21/2023    1:04 PM 04/11/2023   11:41 AM 02/18/2023    9:34 AM  CMP  Glucose 70 - 99 mg/dL 914  782  956   BUN 8 - 23 mg/dL 13  14  18    Creatinine 0.61 - 1.24 mg/dL 2.13  0.86  5.78   Sodium 135 - 145 mmol/L 138  137  137   Potassium 3.5 - 5.1 mmol/L 3.4  3.5  4.2   Chloride 98 - 111 mmol/L 103  103  103   CO2 22 - 32 mmol/L 25  23  26    Calcium 8.9 - 10.3 mg/dL 8.3  8.3  8.5   Total Protein 6.5 - 8.1 g/dL 6.4  6.4  6.2   Total Bilirubin 0.0 - 1.2 mg/dL 0.5  0.8  0.6   Alkaline Phos 38 - 126 U/L 67  68  84   AST 15 - 41 U/L 46  51  38   ALT 0 - 44 U/L 25  25  31       RADIOGRAPHIC STUDIES: I have personally reviewed the radiological images as listed and agreed with the findings in the report. CT Angio Chest Pulmonary Embolism (PE) W or WO Contrast Result Date: 04/27/2023 CLINICAL DATA:  Chest pain. History of chronic lymphocytic leukemia. EXAM: CT ANGIOGRAPHY CHEST WITH CONTRAST TECHNIQUE: Multidetector CT imaging of the chest was performed using the standard protocol during bolus administration of intravenous contrast. Multiplanar CT image  reconstructions and MIPs were obtained to evaluate the vascular anatomy. RADIATION DOSE REDUCTION: This exam was performed according to the departmental dose-optimization program which includes automated exposure control, adjustment of the mA and/or kV according to patient size and/or use of iterative reconstruction technique. CONTRAST:  75mL OMNIPAQUE IOHEXOL 350 MG/ML SOLN COMPARISON:  April 26, 2023. FINDINGS: Cardiovascular: Satisfactory opacification of the pulmonary arteries to the segmental level.  No evidence of pulmonary embolism. Mild cardiomegaly is noted. Minimal pericardial effusion. Mediastinum/Nodes: No enlarged mediastinal, hilar, or axillary lymph nodes. Thyroid gland, trachea, and esophagus demonstrate no significant findings. Lungs/Pleura: No pneumothorax is noted. Small left pleural effusion is noted. Airspace opacity is noted in lingular segment of left upper lobe most consistent with pneumonia. Airspace opacity is also noted in left lower lobe concerning for pneumonia. Upper Abdomen: No acute abnormality. Musculoskeletal: No chest wall abnormality. No acute or significant osseous findings. Review of the MIP images confirms the above findings. IMPRESSION: No definite evidence of pulmonary embolus. Small left pleural effusion is noted. Airspace opacities are noted in lingular segment of left upper lobe as well as in left lower lobe most consistent with pneumonia. Minimal pericardial effusion. Aortic Atherosclerosis (ICD10-I70.0). Electronically Signed   By: Lupita Raider M.D.   On: 04/27/2023 10:53   CT CHEST WO CONTRAST Result Date: 04/27/2023 CLINICAL DATA:  Chronic cough EXAM: CT CHEST WITHOUT CONTRAST TECHNIQUE: Multidetector CT imaging of the chest was performed following the standard protocol without IV contrast. RADIATION DOSE REDUCTION: This exam was performed according to the departmental dose-optimization program which includes automated exposure control, adjustment of the mA and/or  kV according to patient size and/or use of iterative reconstruction technique. COMPARISON:  Chest x-ray August 28, 2022 FINDINGS: Cardiovascular: No significant vascular findings. Normal heart size. No pericardial effusion. Mild coronary artery calcifications. Small pericardial effusion. Correlation with echocardiography recommended as clinically needed. Mediastinum/Nodes: Small adenopathy in the retrocaval pretracheal space measuring 1.5 by 1.1 cm. Consistent with some mild reactive adenopathy. Aortic pulmonic window adenopathy measuring 1.6 x 1 cm. No significant hilar adenopathy. No other mediastinal masses. Lungs/Pleura: There is a small left pleural reaction and pleural effusion. With ill-defined consolidating lingula, infiltrates as well as infiltrates involving the inferior margin of the anterior segment of the left upper lobe. Infiltrates also in the left lower lobe with hypoventilatory atelectatic changes and some volume loss Ill-defined reticulonodular infiltrates are also present in the right lower lobe without consolidation and in the lateral segment of the right middle lobe. Findings consistent with a bronchopneumonia infiltrates. Upper Abdomen: No acute abnormality. Musculoskeletal: No chest wall mass or suspicious bone lesions identified. IMPRESSION: Pulmonary infiltrates as described and left pleural effusion correlate with bronchopneumonia. Follow-up recommended as clinically needed. Small pericardial effusion, correlation with echocardiography recommended as clinically needed. Electronically Signed   By: Shaaron Adler M.D.   On: 04/27/2023 08:57   MR CARDIAC MORPHOLOGY W WO CONTRAST Result Date: 03/30/2023 CLINICAL DATA:  LBBB, evaluate EF, amyloidosis EXAM: CARDIAC MRI TECHNIQUE: The patient was scanned on a 1.5 Tesla Siemens magnet. A dedicated cardiac coil was used. Functional imaging was done using Fiesta sequences. 2,3, and 4 chamber views were done to assess for RWMA's. Modified Simpson's  rule using a short axis stack was used to calculate an ejection fraction on a dedicated work [employer], [employer]. The patient received 13 cc of Gadavist. After 10 minutes inversion recovery sequences were used to assess for infiltration and scar tissue. Velocity flow mapping performed in the ascending aorta and main pulmonary artery. CONTRAST:  13 cc  of Gadavist FINDINGS: 1. Mildly dilated LV size, Moderate to severe LV basal-septal wall thickness, upto 16 mm in wall thickness. Mild to moderately reduced LV systolic function (LVEF = 43%). There is no LVOT obstruction. There is a small mid wall basal lateral LGE/scar. There is no late gadolinium enhancement in the left ventricular myocardium. LVEDV: 301 ml LVESV:  171 ml SV: 130 ml CO: 4.2  L/min Myocardial mass: 262 g LV native T1 value 1128 ms (normal < 1000 ms) LV ECV value 36% (normal <30%) 2. Mildly dilated right ventricular size, thickness and systolic function (RVEF = 48%). There are no regional wall motion abnormalities. 3.  Mild LA dilation, mild-moderate RA dilation. 4. Normal size of the aortic root, ascending aorta and pulmonary artery. 5. Mild mitral regurgitation, mild tricuspid regurgitation. no significant valvular abnormalities. 6.  Normal pericardium.  No pericardial effusion. IMPRESSION: 1. Mildly dilated LV size, mild-moderately reduced LV systolic function. LVEF 43%. 2. Moderate to severe LV basal-septal wall thickness, upto 16 mm in wall thickness. 3. No LVOT obstruction. 4. There is a small mid wall basal lateral LGE/scar. 5. No evidence for amyloidosis. 6. Findings suggest non ischemic cardiomyopathy. Possible inflammatory disease involving the basal lateral wall. Electronically Signed   By: Debbe Odea M.D.   On: 03/30/2023 15:38   MR CARDIAC VELOCITY FLOW MAP Result Date: 03/30/2023 CLINICAL DATA:  LBBB, evaluate EF, amyloidosis EXAM: CARDIAC MRI TECHNIQUE: The patient was scanned on a 1.5 Tesla Siemens magnet. A dedicated  cardiac coil was used. Functional imaging was done using Fiesta sequences. 2,3, and 4 chamber views were done to assess for RWMA's. Modified Simpson's rule using a short axis stack was used to calculate an ejection fraction on a dedicated work [employer] officer, [employer]. The patient received 13 cc of Gadavist. After 10 minutes inversion recovery sequences were used to assess for infiltration and scar tissue. Velocity flow mapping performed in the ascending aorta and main pulmonary artery. CONTRAST:  13 cc  of Gadavist FINDINGS: 1. Mildly dilated LV size, Moderate to severe LV basal-septal wall thickness, upto 16 mm in wall thickness. Mild to moderately reduced LV systolic function (LVEF = 43%). There is no LVOT obstruction. There is a small mid wall basal lateral LGE/scar. There is no late gadolinium enhancement in the left ventricular myocardium. LVEDV: 301 ml LVESV: 171 ml SV: 130 ml CO: 4.2  L/min Myocardial mass: 262 g LV native T1 value 1128 ms (normal < 1000 ms) LV ECV value 36% (normal <30%) 2. Mildly dilated right ventricular size, thickness and systolic function (RVEF = 48%). There are no regional wall motion abnormalities. 3.  Mild LA dilation, mild-moderate RA dilation. 4. Normal size of the aortic root, ascending aorta and pulmonary artery. 5. Mild mitral regurgitation, mild tricuspid regurgitation. no significant valvular abnormalities. 6.  Normal pericardium.  No pericardial effusion. IMPRESSION: 1. Mildly dilated LV size, mild-moderately reduced LV systolic function. LVEF 43%. 2. Moderate to severe LV basal-septal wall thickness, upto 16 mm in wall thickness. 3. No LVOT obstruction. 4. There is a small mid wall basal lateral LGE/scar. 5. No evidence for amyloidosis. 6. Findings suggest non ischemic cardiomyopathy. Possible inflammatory disease involving the basal lateral wall. Electronically Signed   By: Debbe Odea M.D.   On: 03/30/2023 15:38   MR CARDIAC VELOCITY FLOW MAP Result Date:  03/30/2023 CLINICAL DATA:  LBBB, evaluate EF, amyloidosis EXAM: CARDIAC MRI TECHNIQUE: The patient was scanned on a 1.5 Tesla Siemens magnet. A dedicated cardiac coil was used. Functional imaging was done using Fiesta sequences. 2,3, and 4 chamber views were done to assess for RWMA's. Modified Simpson's rule using a short axis stack was used to calculate an ejection fraction on a dedicated work [employer] officer, [employer]. The patient received 13 cc of Gadavist. After 10 minutes inversion recovery sequences were used to assess for infiltration  and scar tissue. Velocity flow mapping performed in the ascending aorta and main pulmonary artery. CONTRAST:  13 cc  of Gadavist FINDINGS: 1. Mildly dilated LV size, Moderate to severe LV basal-septal wall thickness, upto 16 mm in wall thickness. Mild to moderately reduced LV systolic function (LVEF = 43%). There is no LVOT obstruction. There is a small mid wall basal lateral LGE/scar. There is no late gadolinium enhancement in the left ventricular myocardium. LVEDV: 301 ml LVESV: 171 ml SV: 130 ml CO: 4.2  L/min Myocardial mass: 262 g LV native T1 value 1128 ms (normal < 1000 ms) LV ECV value 36% (normal <30%) 2. Mildly dilated right ventricular size, thickness and systolic function (RVEF = 48%). There are no regional wall motion abnormalities. 3.  Mild LA dilation, mild-moderate RA dilation. 4. Normal size of the aortic root, ascending aorta and pulmonary artery. 5. Mild mitral regurgitation, mild tricuspid regurgitation. no significant valvular abnormalities. 6.  Normal pericardium.  No pericardial effusion. IMPRESSION: 1. Mildly dilated LV size, mild-moderately reduced LV systolic function. LVEF 43%. 2. Moderate to severe LV basal-septal wall thickness, upto 16 mm in wall thickness. 3. No LVOT obstruction. 4. There is a small mid wall basal lateral LGE/scar. 5. No evidence for amyloidosis. 6. Findings suggest non ischemic cardiomyopathy. Possible inflammatory disease  involving the basal lateral wall. Electronically Signed   By: Debbe Odea M.D.   On: 03/30/2023 15:38

## 2023-05-02 ENCOUNTER — Other Ambulatory Visit (HOSPITAL_COMMUNITY): Payer: Self-pay

## 2023-05-02 ENCOUNTER — Other Ambulatory Visit: Payer: Self-pay

## 2023-05-02 NOTE — Progress Notes (Signed)
 Specialty Pharmacy Refill Coordination Note  Paul Briggs. is a 78 y.o. male contacted today regarding refills of specialty medication(s) Zanubrutinib (Brukinsa)   Patient requested Delivery   Delivery date: 05/06/23   Verified address: [address]  Medication will be filled on 05/05/23.

## 2023-05-02 NOTE — Progress Notes (Signed)
 Specialty Pharmacy Ongoing Clinical Assessment Note  Paul Armwood [name] is a 78 y.o. male who is being followed by the specialty pharmacy service for RxSp Oncology   Patient's specialty medication(s) reviewed today: Zanubrutinib (Brukinsa)   Missed doses in the last 4 weeks: 0   Patient/Caregiver did not have any additional questions or concerns.   Therapeutic benefit summary: Patient is achieving benefit   Adverse events/side effects summary: No adverse events/side effects   Patient's therapy is appropriate to: Continue    Goals Addressed             This Visit's Progress    Slow Disease Progression   On track    Patient is on track. Patient will maintain adherence         Follow up:  3 months  Otto Herb Specialty Pharmacist

## 2023-05-04 DIAGNOSIS — J188 Other pneumonia, unspecified organism: Secondary | ICD-10-CM | POA: Diagnosis not present

## 2023-05-04 DIAGNOSIS — G8929 Other chronic pain: Secondary | ICD-10-CM | POA: Diagnosis not present

## 2023-05-04 DIAGNOSIS — E119 Type 2 diabetes mellitus without complications: Secondary | ICD-10-CM | POA: Diagnosis not present

## 2023-05-04 DIAGNOSIS — I429 Cardiomyopathy, unspecified: Secondary | ICD-10-CM | POA: Diagnosis not present

## 2023-05-04 DIAGNOSIS — C9110 Chronic lymphocytic leukemia of B-cell type not having achieved remission: Secondary | ICD-10-CM | POA: Diagnosis not present

## 2023-05-04 DIAGNOSIS — M25512 Pain in left shoulder: Secondary | ICD-10-CM | POA: Diagnosis not present

## 2023-05-04 DIAGNOSIS — I10 Essential (primary) hypertension: Secondary | ICD-10-CM | POA: Diagnosis not present

## 2023-05-05 ENCOUNTER — Inpatient Hospital Stay

## 2023-05-05 ENCOUNTER — Ambulatory Visit: Attending: Internal Medicine | Admitting: Internal Medicine

## 2023-05-05 ENCOUNTER — Other Ambulatory Visit: Payer: Self-pay

## 2023-05-05 ENCOUNTER — Encounter: Payer: Self-pay | Admitting: Internal Medicine

## 2023-05-05 VITALS — BP 172/86 | HR 61 | Wt 230.0 lb

## 2023-05-05 DIAGNOSIS — E1122 Type 2 diabetes mellitus with diabetic chronic kidney disease: Secondary | ICD-10-CM | POA: Insufficient documentation

## 2023-05-05 DIAGNOSIS — I447 Left bundle-branch block, unspecified: Secondary | ICD-10-CM

## 2023-05-05 DIAGNOSIS — I130 Hypertensive heart and chronic kidney disease with heart failure and stage 1 through stage 4 chronic kidney disease, or unspecified chronic kidney disease: Secondary | ICD-10-CM | POA: Insufficient documentation

## 2023-05-05 DIAGNOSIS — C9110 Chronic lymphocytic leukemia of B-cell type not having achieved remission: Secondary | ICD-10-CM | POA: Insufficient documentation

## 2023-05-05 DIAGNOSIS — I340 Nonrheumatic mitral (valve) insufficiency: Secondary | ICD-10-CM | POA: Insufficient documentation

## 2023-05-05 DIAGNOSIS — Z87891 Personal history of nicotine dependence: Secondary | ICD-10-CM | POA: Insufficient documentation

## 2023-05-05 DIAGNOSIS — Z79899 Other long term (current) drug therapy: Secondary | ICD-10-CM | POA: Diagnosis not present

## 2023-05-05 DIAGNOSIS — I2510 Atherosclerotic heart disease of native coronary artery without angina pectoris: Secondary | ICD-10-CM | POA: Insufficient documentation

## 2023-05-05 DIAGNOSIS — I5022 Chronic systolic (congestive) heart failure: Secondary | ICD-10-CM | POA: Insufficient documentation

## 2023-05-05 DIAGNOSIS — Z5986 Financial insecurity: Secondary | ICD-10-CM | POA: Diagnosis not present

## 2023-05-05 DIAGNOSIS — I3139 Other pericardial effusion (noninflammatory): Secondary | ICD-10-CM | POA: Insufficient documentation

## 2023-05-05 DIAGNOSIS — N183 Chronic kidney disease, stage 3 unspecified: Secondary | ICD-10-CM | POA: Diagnosis not present

## 2023-05-05 DIAGNOSIS — I10 Essential (primary) hypertension: Secondary | ICD-10-CM | POA: Diagnosis not present

## 2023-05-05 DIAGNOSIS — I517 Cardiomegaly: Secondary | ICD-10-CM

## 2023-05-05 DIAGNOSIS — Z7984 Long term (current) use of oral hypoglycemic drugs: Secondary | ICD-10-CM | POA: Diagnosis not present

## 2023-05-05 DIAGNOSIS — I428 Other cardiomyopathies: Secondary | ICD-10-CM | POA: Insufficient documentation

## 2023-05-05 DIAGNOSIS — D509 Iron deficiency anemia, unspecified: Secondary | ICD-10-CM | POA: Diagnosis not present

## 2023-05-05 LAB — SAMPLE TO BLOOD BANK

## 2023-05-05 LAB — HEMOGLOBIN AND HEMATOCRIT (CANCER CENTER ONLY)
HCT: 28.6 % — ABNORMAL LOW (ref 39.0–52.0)
Hemoglobin: 8.5 g/dL — ABNORMAL LOW (ref 13.0–17.0)

## 2023-05-05 MED ORDER — SPIRONOLACTONE 25 MG PO TABS: 25.0000 mg | ORAL_TABLET | Freq: Every day | ORAL | 3 refills | Status: DC

## 2023-05-05 MED ORDER — HYDRALAZINE HCL 50 MG PO TABS: 100.0000 mg | ORAL_TABLET | Freq: Two times a day (BID) | ORAL | 3 refills | Status: DC

## 2023-05-05 MED ORDER — ROSUVASTATIN CALCIUM 40 MG PO TABS: 40.0000 mg | ORAL_TABLET | Freq: Every day | ORAL | 1 refills | Status: DC

## 2023-05-05 MED ORDER — SACUBITRIL-VALSARTAN 49-51 MG PO TABS: 1.0000 | ORAL_TABLET | Freq: Two times a day (BID) | ORAL | 6 refills | Status: DC

## 2023-05-05 MED ORDER — FUROSEMIDE 20 MG PO TABS: 20.0000 mg | ORAL_TABLET | Freq: Every day | ORAL | 6 refills | Status: DC

## 2023-05-05 MED ORDER — METOPROLOL SUCCINATE ER 25 MG PO TB24: 25.0000 mg | ORAL_TABLET | Freq: Every day | ORAL | 1 refills | Status: AC

## 2023-05-05 MED ORDER — SPIRONOLACTONE 25 MG PO TABS: 12.5000 mg | ORAL_TABLET | Freq: Every day | ORAL | 3 refills | Status: DC

## 2023-05-05 NOTE — Patient Instructions (Signed)
 Medication Changes:  START Spironolactone 12.5mg  (1/2 tab) daily  START Entresto 49-51mg  (1 tab) two times daily   Testing/Procedures:     Please report to Radiology at the [facility] Main Entrance 30 minutes early for your test.  [address] [place], [place] [address]                         How to Prepare for Your Cardiac PET/CT Stress Test:  Nothing to eat or drink, except water, 3 hours prior to arrival time.  NO caffeine/decaffeinated products, or chocolate 12 hours prior to arrival. (Please note decaffeinated beverages (teas/coffees) still contain caffeine).  If you have caffeine within 12 hours prior, the test will need to be rescheduled.  Medication instructions: Do not take erectile dysfunction medications for 72 hours prior to test (sildenafil, tadalafil) Do not take nitrates (isosorbide mononitrate, Ranexa) the day before or day of test Do not take tamsulosin the day before or morning of test Hold theophylline containing medications for 12 hours. Hold Dipyridamole 48 hours prior to the test.  Diabetic Preparation: If able to eat breakfast prior to 3 hour fasting, you may take all medications, including your insulin. Do not worry if you miss your breakfast dose of insulin - start at your next meal. If you do not eat prior to 3 hour fast-Hold all diabetes (oral and insulin) medications. Patients who wear a continuous glucose monitor MUST remove the device prior to scanning.  You may take your remaining medications with water.  NO perfume, cologne or lotion on chest or abdomen area. FEMALES - Please avoid wearing dresses to this appointment.  Total time is 1 to 2 hours; you may want to bring reading material for the waiting time.  IF YOU THINK YOU MAY BE PREGNANT, OR ARE NURSING PLEASE INFORM THE TECHNOLOGIST.  In preparation for your appointment, medication and supplies will be purchased.  Appointment availability is limited, so if you need to cancel  or reschedule, please call the Radiology Department Scheduler at [phone] 24 hours in advance to avoid a cancellation fee of $100.00  What to Expect When you Arrive:  Once you arrive and check in for your appointment, you will be taken to a preparation room within the Radiology Department.  A technologist or Nurse will obtain your medical history, verify that you are correctly prepped for the exam, and explain the procedure.  Afterwards, an IV will be started in your arm and electrodes will be placed on your skin for EKG monitoring during the stress portion of the exam. Then you will be escorted to the PET/CT scanner.  There, staff will get you positioned on the scanner and obtain a blood pressure and EKG.  During the exam, you will continue to be connected to the EKG and blood pressure machines.  A small, safe amount of a radioactive tracer will be injected in your IV to obtain a series of pictures of your heart along with an injection of a stress agent.    After your Exam:  It is recommended that you eat a meal and drink a caffeinated beverage to counter act any effects of the stress agent.  Drink plenty of fluids for the remainder of the day and urinate frequently for the first couple of hours after the exam.  Your doctor will inform you of your test results within 7-10 business days.  For more information and frequently asked questions, please visit our website: [email or web address]  For questions about your test or how to prepare for your test, please call: Cardiac Imaging Nurse Navigators Office: [phone]   Follow-Up in: Please follow up with the [facility] in 2 weeks with the Pharmacist and again in 2 months in with Dr. Gala Romney.  At the [facility], you and your health needs are our priority. We have a designated team specialized in the treatment of Heart Failure. This Care Team includes your primary Heart Failure Specialized  Cardiologist (physician), Advanced Practice Providers (APPs- Physician Assistants and Nurse Practitioners), and Pharmacist who all work together to provide you with the care you need, when you need it.   You may see any of the following providers on your designated Care Team at your next follow up:  Dr. Arvilla Meres Dr. Marca Ancona Dr. Dorthula Nettles Dr. Theresia Bough Clarisa Kindred, FNP Enos Fling, RPH-CPP  Please be sure to bring in all your medications bottles to every appointment.   Need to Contact us:  If you have any questions or concerns before your next appointment please send [place] a message through [place] or call our office at [phone].    TO LEAVE A MESSAGE FOR THE NURSE SELECT OPTION 2, PLEASE LEAVE A MESSAGE INCLUDING: YOUR NAME DATE OF BIRTH CALL BACK NUMBER REASON FOR CALL**this is important as we prioritize the call backs  YOU WILL RECEIVE A CALL BACK THE SAME DAY AS LONG AS YOU CALL BEFORE 4:00 PM

## 2023-05-05 NOTE — Progress Notes (Signed)
 [facility] CONSULT NOTE  Referring Physician: Wilford Corner, PA-C Primary Care: Wilford Corner, PA-C Primary Cardiologist: Debbe Odea, MD  Chief Complaint:  HPI:  Paul Campanaro [name] is a 78 y.o. male  with h/o HFmrEF, NICM, LVH, HTN, nonobstructive CAD, diabetes, CLL s/p chemo, CKD stage 3, IDA who presents for pre-op evaluation.     Diagnsed with CLL in 12/23   The patient was initially seen 05/2022 for HTN and he was started on Diovan 160mg  daily. EKG showed LBBB and an echo was ordered. Echo showed LVEF 45-50%, no WMA, moderate to severe LVH with speckled appearance, consider for amyloidosis, severely dilated left atrium, mildly dilated right atrium, small pericardial effusion with no tamponade, mild MR. Plan was for cardiac CTA and cardiac MRI.  Cardiac CTA showed coronary calcium score of 14.3, 12th percentile for age and sex matched control, minimal proximal LAD stenosis, overall minimal nonobstructive CAD.   Patient was admitted August 26, 2022 with a mechanical fall fall, cellulitis of the left groin/SIRS, and mildly elevated troponin. He was briefly seen by cardiology and felt to be stable. No inpatient cardiac work-up was pursued.    The patient was last seen 09/17/22 for pre-op for laparoscopic colectomy 09/28/22. He felt weak since being discharged from the hospital, but was overall stable from a cardiac perspective. He was encouraged to get the cardiac MRI done.   cMRI 3/25 EF 43% Mod-sev basal septal hypertrophy (16mm) RVEF 48% No LGE Mild MR .Findings suggest non ischemic cardiomyopathy. Possible inflammatory disease involving the basal lateral wall.   Says he remains active. 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 Mild SOB at times but not limiting. Can carry plywood and sheet rock without problem. Mild pedal edema. No palpitations. No syncope/presyncope. Has run out of most of his meds for past 1-2 week,   Follows with Onc for his CLL.  Zanubrutinib was stopped recently due to anemia. CT chest 04/27/23 No PE. No mention of sarcoid    Past Medical History:  Diagnosis Date   Adenoma of left adrenal gland    Anemia    Aortic atherosclerosis (HCC)    Blood transfusion without reported diagnosis    BPH (benign prostatic hyperplasia)    Bradycardia    CAD (coronary artery disease) 08/26/2022   a.) cCTA 08/26/2022: Ca2+ 14.3 (12th %'ile for age/sex.race match control); (<25%) pLAD   Cellulitis of left thigh 08/2022   Chronic pain syndrome    a.) on COT managed by pain management   Chronic, continuous use of opioids    a.) chronic pain syndrome/chronic back pain; managed by pain management   CKD (chronic kidney disease) stage 3, GFR 30-59 ml/min (HCC)    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   a.) Rai stage III   DDD (degenerative disc disease), cervical    Difficult airway 10/22/2015   a.) 1st attempt with glidescope  --> macroglossic (unsuccessful); b.) 2nd attempt with Mac 4 and direct laryngoscopy with a 8.0 ETT --> tube too large to pass through the cords; b.) 3rd attempt (successful) with a Mac 4 and a 7.5 tube   Diverticulosis    DM (diabetes mellitus), type 2 (HCC)    Dysplasia of prostate    Erectile dysfunction    Frequent falls    Heart failure with mildly reduced ejection fraction (HFmrEF) (HCC)    a.) TTE 10/23/2015: EF 60-65%, mod-sev LVH, mild biatrial dil, degen MV disease, AoV sclerosis, asc Ao 38 mm, G1DD;  b.) TTE 08/05/2022: EF 45-50%, mod-sev LVH (speckled pattern), sev biatrial dil, mild MR, AoV sclerosis, G1DD   Hepatic flexure mass 08/23/2022   a.) colonoscopy 08/23/2022: 14 x 15 mm partially obstructing ulcerated distal ascending colon mass; tissue friable --> pathology resulted as tubular adenoma, however felt to be false (-) --> referral to sugery and colectomy recommended; b.) s/p RIGHT hemicolectomy 09/28/2022   History of MRSA infection 08/28/2022   a.) MRSA PCR (+) 08/28/2022;  culture from LEFT thigh abscess   HTN (hypertension)    Hyperlipidemia    Hypogonadism in male    Hypothyroidism    IDA (iron deficiency anemia)    LBBB (left bundle branch block)    Long-term use of aspirin therapy    Lumbar spinal stenosis    Mild cardiomegaly    Multiple lung nodules on CT    NICM (nonischemic cardiomyopathy) (HCC)    a.) TTE 10/23/2015: EF 60-65; b.) TTE 08/05/2022: EF 45-50%   Orchitis and epididymitis 06/16/2013   OSA on CPAP    Palindromic rheumatism, hand    Pericardial effusion    Prostatitis    Sepsis (HCC) 08/2022   Subdural hematoma (HCC) 10/22/2015   a.) s/p traumatic mechanical fall --> CT head 10/22/2015: high-density SDH the left cerebral convexity with13 mm midline shift --> s/p LEFT frontal burr hole craniotomy   Tubular adenoma of colon    Ventral hernia     Current Outpatient Medications  Medication Sig Dispense Refill   aspirin EC 81 MG tablet Take 1 tablet (81 mg total) by mouth daily. Swallow whole.     levothyroxine (SYNTHROID) 200 MCG tablet Take 1 tablet (200 mcg total) by mouth daily before breakfast. 90 tablet 0   oxyCODONE-acetaminophen (PERCOCET) 10-325 MG tablet Take 1 tablet by mouth every 8 (eight) hours as needed for pain. Must last 30 days. 90 tablet 0   calcium-vitamin D (OSCAL WITH D) 500-5 MG-MCG tablet Take 1 tablet by mouth daily with breakfast. (Patient not taking: Reported on 05/05/2023)     cyanocobalamin (VITAMIN B12) 1000 MCG tablet Take 1 tablet (1,000 mcg total) by mouth daily. (Patient not taking: Reported on 05/05/2023) 90 tablet 0   furosemide (LASIX) 20 MG tablet Take 20 mg by mouth daily. (Patient not taking: Reported on 05/05/2023)     hydrALAZINE (APRESOLINE) 50 MG tablet Take 2 tablets (100 mg total) by mouth 2 (two) times daily. (Patient not taking: Reported on 05/05/2023) 180 tablet 3   metFORMIN (GLUCOPHAGE) 500 MG tablet Take 2 tablets (1,000 mg total) by mouth 2 (two) times daily with a meal. (Patient not  taking: Reported on 05/05/2023) 120 tablet 0   methocarbamol (ROBAXIN) 500 MG tablet Take 1 tablet (500 mg total) by mouth every 8 (eight) hours as needed for muscle spasms. (Patient not taking: Reported on 05/05/2023) 30 tablet 0   metoprolol succinate (TOPROL XL) 25 MG 24 hr tablet Take 1 tablet (25 mg total) by mouth daily. (Patient not taking: Reported on 05/05/2023) 90 tablet 1   [START ON 05/16/2023] oxyCODONE-acetaminophen (PERCOCET) 10-325 MG tablet Take 1 tablet by mouth every 8 (eight) hours as needed for pain. Must last 30 days. (Patient not taking: Reported on 05/05/2023) 90 tablet 0   rosuvastatin (CRESTOR) 40 MG tablet Take 1 tablet (40 mg total) by mouth daily. (Patient not taking: Reported on 05/05/2023) 90 tablet 1   zanubrutinib (BRUKINSA) 80 MG capsule Take 2 capsules (160 mg total) by mouth 2 (two) times daily. (Patient not taking:  Reported on 05/05/2023) 120 capsule 2   No current facility-administered medications for this visit.    No Known Allergies    Social History   Socioeconomic History   Marital status: Married    Spouse name: Not on file   Number of children: Not on file   Years of education: Not on file   Highest education level: 5th grade  Occupational History   Not on file  Tobacco Use   Smoking status: Former    Types: Cigarettes    Passive exposure: Past   Smokeless tobacco: Never   Tobacco comments:    Stop smoking iver 40  plus years ago  Vaping Use   Vaping status: Never Used  Substance and Sexual Activity   Alcohol use: No   Drug use: No   Sexual activity: Not Currently  Other Topics Concern   Not on file  Social History Narrative   Not on file   Social Drivers of Health   Financial Resource Strain: Patient Declined (04/26/2023)   Received from [facility]   Overall Financial Resource Strain (CARDIA)    Difficulty of Paying Living Expenses: Patient declined  Recent Concern: Financial Resource Strain - Medium Risk  (02/24/2023)   Received from [facility]   Overall Financial Resource Strain (CARDIA)    Difficulty of Paying Living Expenses: Somewhat hard  Food Insecurity: Patient Declined (04/26/2023)   Received from [facility]   Hunger Vital Sign    Worried About Running Out of Food in the Last Year: Patient declined    Ran Out of Food in the Last Year: Patient declined  Recent Concern: Food Insecurity - Food Insecurity Present (02/24/2023)   Received from [facility]   Hunger Vital Sign    Worried About Running Out of Food in the Last Year: Sometimes true    Ran Out of Food in the Last Year: Sometimes true  Transportation Needs: No Transportation Needs (04/26/2023)   Received from [facility] - Transportation    In the past 12 months, has lack of transportation kept you from medical appointments or from getting medications?: No    Lack of Transportation (Non-Medical): No  Physical Activity: Unknown (07/26/2022)   Exercise Vital Sign    Days of Exercise per Week: Patient declined    Minutes of Exercise per Session: Not on file  Stress: Stress Concern Present (07/26/2022)   [employer] of Occupational Health - Occupational Stress Questionnaire    Feeling of Stress : To some extent  Social Connections: Socially Integrated (12/20/2022)   Social Connection and Isolation Panel [NHANES]    Frequency of Communication with Friends and Family: More than three times a week    Frequency of Social Gatherings with Friends and Family: Patient declined    Attends Religious Services: More than 4 times per year    Active Member of [employer] or Organizations: Yes    Attends Banker Meetings: Never    Marital Status: Married  Catering manager Violence: Not At Risk (09/28/2022)   Humiliation, Afraid, Rape, and Kick questionnaire    Fear of Current or Ex-Partner: No    Emotionally Abused: No    Physically Abused: No     Sexually Abused: No      Family History  Problem Relation Age of Onset   Cancer Maternal Aunt    ADD / ADHD Daughter    Asthma Son    Prostate  cancer Neg Hx    Kidney disease Neg Hx    Kidney cancer Neg Hx    Bladder Cancer Neg Hx     Vitals:   05/05/23 1110  BP: (!) 172/86  Pulse: 61  SpO2: 99%  Weight: 230 lb (104.3 kg)    PHYSICAL EXAM: General:  Well appearing. No respiratory difficulty HEENT: normal Neck: supple. no JVD. Carotids 2+ bilat; no bruits. No lymphadenopathy or thryomegaly appreciated. Cor: PMI nondisplaced. Regular rate & rhythm. No rubs, gallops or murmurs. Lungs: clear Abdomen: soft, nontender, nondistended. No hepatosplenomegaly. No bruits or masses. Good bowel sounds. Extremities: no cyanosis, clubbing, rash, edema Neuro: alert & oriented x 3, cranial nerves grossly intact. moves all 4 extremities w/o difficulty. Affect pleasant.  ECG: Sinus brady 57 1AVB 232 md. Atypical LBBB 148    ASSESSMENT & PLAN:  1. Chronic systolic HF due to NICM  - Echo 5/24 LVEF 45-50%, no WMA, moderate to severe LVH, severely dilated left atrium, mildly dilated right atrium, small pericardial effusion with no tamponade, mild MR. - Cardiac CTA CAC 14.3,minimal proximal LAD stenosis, overall minimal nonobstructive CAD. - cMRI 3/25 EF 43% Mod-sev basal septal hypertrophy (16mm) RVEF 48% No LGE Mild MR .Findings suggest non ischemic cardiomyopathy. Possible inflammatory disease involving the basal lateral wall. - most likely etiology is HTN cardiomyopathy but cMRI also raises possibility of sarcoid (CLL treatment or LBBB etiology less likely) - Volume OK - NYHA II - He is out of all of his meds - Will start Entresto 49/51 bid - Spiro 12.5 daily - Stressed need for med compliance  - Proceed with cardiac PET to evaluate for sarcoid Refer to HF PharmD for help with med access, titration and compliance   2. HTN, severe - meds as above - consider sleep study   3. CLL -  Oncology following  Arvilla Meres, MD  11:44 AM

## 2023-05-06 ENCOUNTER — Inpatient Hospital Stay

## 2023-05-06 DIAGNOSIS — M19011 Primary osteoarthritis, right shoulder: Secondary | ICD-10-CM | POA: Diagnosis not present

## 2023-05-06 DIAGNOSIS — M75121 Complete rotator cuff tear or rupture of right shoulder, not specified as traumatic: Secondary | ICD-10-CM | POA: Diagnosis not present

## 2023-05-09 ENCOUNTER — Other Ambulatory Visit: Payer: Self-pay | Admitting: Student

## 2023-05-09 ENCOUNTER — Encounter: Payer: Self-pay | Admitting: Oncology

## 2023-05-09 DIAGNOSIS — M12811 Other specific arthropathies, not elsewhere classified, right shoulder: Secondary | ICD-10-CM

## 2023-05-09 DIAGNOSIS — M19011 Primary osteoarthritis, right shoulder: Secondary | ICD-10-CM

## 2023-05-09 DIAGNOSIS — M75121 Complete rotator cuff tear or rupture of right shoulder, not specified as traumatic: Secondary | ICD-10-CM

## 2023-05-12 ENCOUNTER — Inpatient Hospital Stay (HOSPITAL_BASED_OUTPATIENT_CLINIC_OR_DEPARTMENT_OTHER): Admitting: Oncology

## 2023-05-12 ENCOUNTER — Inpatient Hospital Stay

## 2023-05-12 ENCOUNTER — Encounter: Payer: Self-pay | Admitting: Oncology

## 2023-05-12 VITALS — BP 153/70 | HR 70 | Temp 97.1°F | Resp 18 | Wt 223.3 lb

## 2023-05-12 VITALS — BP 143/78 | HR 66

## 2023-05-12 DIAGNOSIS — D509 Iron deficiency anemia, unspecified: Secondary | ICD-10-CM | POA: Diagnosis not present

## 2023-05-12 DIAGNOSIS — N1831 Chronic kidney disease, stage 3a: Secondary | ICD-10-CM

## 2023-05-12 DIAGNOSIS — C9110 Chronic lymphocytic leukemia of B-cell type not having achieved remission: Secondary | ICD-10-CM

## 2023-05-12 LAB — CBC WITH DIFFERENTIAL (CANCER CENTER ONLY)
Abs Immature Granulocytes: 0.14 10*3/uL — ABNORMAL HIGH (ref 0.00–0.07)
Basophils Absolute: 0.1 10*3/uL (ref 0.0–0.1)
Basophils Relative: 1 %
Eosinophils Absolute: 0.3 10*3/uL (ref 0.0–0.5)
Eosinophils Relative: 4 %
HCT: 29.3 % — ABNORMAL LOW (ref 39.0–52.0)
Hemoglobin: 8.9 g/dL — ABNORMAL LOW (ref 13.0–17.0)
Immature Granulocytes: 2 %
Lymphocytes Relative: 33 %
Lymphs Abs: 2.1 10*3/uL (ref 0.7–4.0)
MCH: 27.1 pg (ref 26.0–34.0)
MCHC: 30.4 g/dL (ref 30.0–36.0)
MCV: 89.1 fL (ref 80.0–100.0)
Monocytes Absolute: 1 10*3/uL (ref 0.1–1.0)
Monocytes Relative: 16 %
Neutro Abs: 2.8 10*3/uL (ref 1.7–7.7)
Neutrophils Relative %: 44 %
Platelet Count: 194 10*3/uL (ref 150–400)
RBC: 3.29 MIL/uL — ABNORMAL LOW (ref 4.22–5.81)
RDW: 21.2 % — ABNORMAL HIGH (ref 11.5–15.5)
Smear Review: NORMAL
WBC Count: 6.4 10*3/uL (ref 4.0–10.5)
nRBC: 0 % (ref 0.0–0.2)

## 2023-05-12 LAB — CMP (CANCER CENTER ONLY)
ALT: 27 U/L (ref 0–44)
AST: 54 U/L — ABNORMAL HIGH (ref 15–41)
Albumin: 3.7 g/dL (ref 3.5–5.0)
Alkaline Phosphatase: 83 U/L (ref 38–126)
Anion gap: 8 (ref 5–15)
BUN: 15 mg/dL (ref 8–23)
CO2: 23 mmol/L (ref 22–32)
Calcium: 8.5 mg/dL — ABNORMAL LOW (ref 8.9–10.3)
Chloride: 108 mmol/L (ref 98–111)
Creatinine: 1.43 mg/dL — ABNORMAL HIGH (ref 0.61–1.24)
GFR, Estimated: 50 mL/min — ABNORMAL LOW (ref >60)
Glucose, Bld: 161 mg/dL — ABNORMAL HIGH (ref 70–99)
Potassium: 3.9 mmol/L (ref 3.5–5.1)
Sodium: 139 mmol/L (ref 135–145)
Total Bilirubin: 1.2 mg/dL (ref 0.0–1.2)
Total Protein: 6.4 g/dL — ABNORMAL LOW (ref 6.5–8.1)

## 2023-05-12 LAB — IRON AND TIBC
Iron: 43 ug/dL — ABNORMAL LOW (ref 45–182)
Saturation Ratios: 14 % — ABNORMAL LOW (ref 17.9–39.5)
TIBC: 309 ug/dL (ref 250–450)
UIBC: 266 ug/dL

## 2023-05-12 LAB — FERRITIN: Ferritin: 81 ng/mL (ref 24–336)

## 2023-05-12 LAB — SAMPLE TO BLOOD BANK

## 2023-05-12 LAB — RETIC PANEL
Immature Retic Fract: 26.6 % — ABNORMAL HIGH (ref 2.3–15.9)
RBC.: 3.32 MIL/uL — ABNORMAL LOW (ref 4.22–5.81)
Retic Count, Absolute: 80.7 10*3/uL (ref 19.0–186.0)
Retic Ct Pct: 2.4 % (ref 0.4–3.1)
Reticulocyte Hemoglobin: 28.5 pg (ref >27.9)

## 2023-05-12 MED ORDER — IRON SUCROSE 20 MG/ML IV SOLN
200.0000 mg | Freq: Once | INTRAVENOUS | Status: AC
  Administered 2023-05-12: 200 mg via INTRAVENOUS
  Filled 2023-05-12: qty 10

## 2023-05-12 MED ORDER — SODIUM CHLORIDE 0.9% FLUSH
10.0000 mL | Freq: Once | INTRAVENOUS | Status: AC | PRN
  Administered 2023-05-12: 10 mL
  Filled 2023-05-12: qty 10

## 2023-05-12 NOTE — Assessment & Plan Note (Signed)
 Labs are reviewed and discussed with patient. CT chest abdomen pelvis results reviewed and discussed with patient.   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 Recommend patient to hold off Zanubrutinib 160 mg twice daily, plan to start back in 2 weeks. Aaron Aas

## 2023-05-12 NOTE — Progress Notes (Signed)
 Hematology/Oncology Progress note [phone] [phone]        ASSESSMENT & PLAN:   Cancer Staging  CLL (chronic lymphocytic leukemia) (HCC) Staging form: Chronic Lymphocytic Leukemia / Small Lymphocytic Lymphoma, AJCC 8th Edition - Clinical stage from 12/28/2021: Modified Rai Stage III (Modified Rai risk: High, Lymphocytosis: Present, Adenopathy: Present, Organomegaly: Absent, Anemia: Present, Thrombocytopenia: Absent) - Signed by [name], MD on 01/20/2022   CLL (chronic lymphocytic leukemia) [facility]) Labs are reviewed and discussed with patient. CT chest abdomen pelvis results reviewed and discussed with patient.   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 Recommend patient to hold off Zanubrutinib 160 mg twice daily, plan to start back in 2 weeks. .   IDA (iron deficiency anemia) Anemia is likely multifactorial, could be due to CLL, anemia due to chronic kidney disease and IDA. Lab Results  Component Value Date   HGB 8.9 (L) 05/12/2023   TIBC 309 05/12/2023   IRONPCTSAT 14 (L) 05/12/2023   FERRITIN 81 05/12/2023   recurrent iron deficiency anemia.   Proceed with IV venofer today.  Colonoscopy findings were reviewed. No obvious source of bleeding.       CKD (chronic kidney disease) stage 3, GFR 30-59 ml/min (HCC) Encourage oral hydration and avoid nephrotoxins.  Cr is worse, likely due to recent contrast study   Orders Placed This Encounter  Procedures   CBC with Differential (Cancer Center Only)    Standing Status:   Future    Expected Date:   05/26/2023    Expiration Date:   05/11/2024   Retic Panel    Standing Status:   Future    Expected Date:   05/26/2023    Expiration Date:   05/11/2024   Sample to Blood Bank    Standing Status:   Future    Expected Date:   05/26/2023    Expiration Date:   05/11/2024   Follow up   2 weeks lab Md Venofer  All questions were  answered. The patient knows to call the clinic with any problems, questions or concerns.  [name], MD, PhD [facility] Hematology Oncology 05/12/2023    CHIEF COMPLAINTS/PURPOSE OF CONSULTATION:  CLL  HISTORY OF PRESENTING ILLNESS:  Paul Ana [name] 78 y.o. male presents for follow up of CLL I have reviewed his chart and materials related to his cancer extensively and collaborated history with the patient. Summary of oncologic history is as follows: Oncology History  CLL (chronic lymphocytic leukemia) (HCC)  12/25/2021 - 12/26/2021 Hospital Admission   Patient was admitted due to pain and swelling of right ankle, right knee and left wrist.  Patient was found to have WBC 30.4, with neutrophil 29% and lymphocyte 2%.  Peripheral smear showed leukocytosis with slight left shift in myeloid series and lymphocytosis with abnormal morphology.  Uric acid 5.5, worsening renal function with creatinine 1.35, BUN 18, GFR 55 (baseline creatinine 1.08 on 07/06/2021), lactic acid 1.1.  ESR normal, uric acid 5.5 normal, CRP 22.2, LDH 182  Orthopedic surgery was consulted s/p arthrocentesis, cell count 296 with 72% neutrophil, no crystal. Fluid negative for growth.  Patient received cefepime and vancomycin while in the hospital, Patient was discharged on Keflex for 5 days.   12/28/2021 Cancer Staging   Staging form: Chronic Lymphocytic Leukemia / Small Lymphocytic Lymphoma, AJCC 8th Edition - Clinical stage from 12/28/2021: Modified Rai Stage III (Modified Rai risk: High, Lymphocytosis: Present, Adenopathy: Present, Organomegaly:  Absent, Anemia: Present, Thrombocytopenia: Absent) - Signed by [name], MD on 01/20/2022 Stage prefix: Initial diagnosis Hemoglobin (Hgb) (g/dL): 86.5   78/04/6960 Initial Diagnosis   CLL (chronic lymphocytic leukemia)   12/27/21 peripheral blood flowcytometry showed Involvement by CD5+, CD23+, CD20+, CD22+ clonal B cell population, phenotype typical for chronic lymphocytic  leukemia/small lymphocytic lymphoma (CLL/SLL), 2 clones present  Two monoclonal B cell populations were detected which have an identical  phenotype except for light chain expression.    01/05/2022 Imaging   CT chest abdomen pelvis wo contrast 1. Multiple prominent borderline enlarged and mildly enlarged lymph nodes, most evident in the low anatomic pelvis, as above, compatible with reported clinical history of CLL. 2. There also several small pulmonary nodules in the lungs measuring 5 mm or less in size. This is nonspecific, but statistically likely benign. No follow-up needed if patient is low-risk (and has no known or suspected primary neoplasm). Non-contrast chest CT can be considered in 12 months if patient is high-risk. This recommendation 3. Aortic atherosclerosis, in addition to left main and 2 vessel coronary artery disease. Please note that although the presence of coronary artery calcium documents the presence of coronary artery disease, the severity of this disease and any potential stenosis cannot be assessed on this non-gated CT examination. Assessment for  potential risk factor modification, dietary therapy or pharmacologic therapy may be warranted, if clinically indicated. 4. There are calcifications of the aortic valve. Echocardiographic correlation for evaluation of potential valvular dysfunction may be warranted if clinically indicated. 5. 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 6. Diverticulosis without evidence of acute diverticulitis at this time. 7. Mild cardiomegaly.   08/26/2022 Imaging   CT abdomen pelvis w contrast  1. Subcutaneous fat stranding within the left lower quadrant anterior abdominal wall extending into the anterior proximal left thigh, likely related to contusion given history of recent trauma. No fluid collection or hematoma. 2. No acute displaced fracture. 3. Retroperitoneal and pelvic lymphadenopathy as above, with waxing and waning appearance.  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 4. 1.9 cm hypodensity within the medial aspect of the spleen,nonspecific. Leukemic involvement of the spleen cannot be excluded. 5. Distal colonic diverticulosis without diverticulitis. 6.  Aortic Atherosclerosis      08/23/2022 Colonoscopy showed - Likely malignant partially obstructing tumor in the distal ascending colon. Biopsied. Tattooed. - Three 4 to 6 mm polyps in the transverse colon and in the ascending colon, removed with a cold snare. Resected and retrieved. - Five 3 to 7 mm polyps in the rectum, in the descending colon, in the transverse colon and in the ascending colon, removed with a hot snare. Resected and retrieved. 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 - Non- bleeding internal hemorrhoids.  Pathology showed 1. Ascending Colon Polyp, x2 cold snare - TUBULAR ADENOMA(S) (MULTIPLE FRAGMENTS) - NEGATIVE FOR HIGH-GRADE DYSPLASIA OR MALIGNANCY 2. Ascending Colon Polyp, hot snare - TUBULAR ADENOMA (MULTIPLE FRAGMENTS) - NEGATIVE FOR HIGH-GRADE DYSPLASIA OR MALIGNANCY 3. Ascending Colon Biopsy, mass cbx - TUBULAR ADENOMA (MULTIPLE FRAGMENTS) - SEE NOTE 4. Transverse Colon Polyp, hot snare; cold snare - TUBULAR ADENOMA (MULTIPLE FRAGMENTS) - NEGATIVE FOR HIGH-GRADE DYSPLASIA OR MALIGNANCY 5. Descending Colon Polyp, x2 hot snare - TUBULAR ADENOMA(S) (MULTIPLE FRAGMENTS) - NEGATIVE FOR HIGH-GRADE DYSPLASIA OR MALIGNANCY 6. Rectum, polyp(s), hot snare - TUBULOVILLOUS ADENOMA (MULTIPLE FRAGMENTS) - NEGATIVE FOR HIGH-GRADE DYSPLASIA OR MALIGNANCY I&D Recent admission due to sepsis due to cellulitis, left groin abscess, s/p I&D  04/24/2023 s/p colonoscopy, removal  of small polyps.  04/26/2023 CT chest wo contrast  Pulmonary infiltrates as described and left pleural effusion correlate with bronchopneumonia. Follow-up recommended as clinically needed. Small pericardial effusion, correlation with echocardiography recommended as  clinically needed.  04/27/2023 CT chest PE angiogram  No definite evidence of pulmonary embolus. Small left pleural effusion is noted. Airspace opacities are noted in lingular segment of left upper lobe as well as in left lower lobe most consistent with pneumonia. Minimal pericardial effusion. Aortic Atherosclerosis   INTERVAL HISTORY Brack Shaddock [name] is a 78 y.o. male who has above history reviewed by me today presents for follow up visit for CLL, iron deficiency anemia, colon tubular adenoma with high-grade dysplastic status post resection. He feels much better today.  He finishes course of antibiotics for pneumonia, cough has resolved.   MEDICAL HISTORY:  Past Medical History:  Diagnosis Date   Adenoma of left adrenal gland    Anemia    Aortic atherosclerosis (HCC)    Blood transfusion without reported diagnosis    BPH (benign prostatic hyperplasia)    Bradycardia    CAD (coronary artery disease) 08/26/2022   a.) cCTA 08/26/2022: Ca2+ 14.3 (12th %'ile for age/sex.race match control); (<25%) pLAD   Cellulitis of left thigh 08/2022   Chronic pain syndrome    a.) on COT managed by pain management   Chronic, continuous use of opioids    a.) chronic pain syndrome/chronic back pain; managed by pain management   CKD (chronic kidney disease) stage 3, GFR 30-59 ml/min (HCC)    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   a.) Rai stage III   DDD (degenerative disc disease), cervical    Difficult airway 10/22/2015   a.) 1st attempt with glidescope  --> macroglossic (unsuccessful); b.) 2nd attempt with Mac 4 and direct laryngoscopy with a 8.0 ETT --> tube too large to pass through the cords; b.) 3rd attempt (successful) with a Mac 4 and a 7.5 tube   Diverticulosis    DM (diabetes mellitus), type 2 (HCC)    Dysplasia of prostate    Erectile dysfunction    Frequent falls    Heart failure with mildly reduced ejection fraction (HFmrEF) (HCC)    a.) TTE 10/23/2015: EF  60-65%, mod-sev LVH, mild biatrial dil, degen MV disease, AoV sclerosis, asc Ao 38 mm, G1DD; b.) TTE 08/05/2022: EF 45-50%, mod-sev LVH (speckled pattern), sev biatrial dil, mild MR, AoV sclerosis, G1DD   Hepatic flexure mass 08/23/2022   a.) colonoscopy 08/23/2022: 14 x 15 mm partially obstructing ulcerated distal ascending colon mass; tissue friable --> pathology resulted as tubular adenoma, however felt to be false (-) --> referral to sugery and colectomy recommended; b.) s/p RIGHT hemicolectomy 09/28/2022   History of MRSA infection 08/28/2022   a.) MRSA PCR (+) 08/28/2022; culture from LEFT thigh abscess   HTN (hypertension)    Hyperlipidemia    Hypogonadism in male    Hypothyroidism    IDA (iron deficiency anemia)    LBBB (left bundle branch block)    Long-term use of aspirin therapy    Lumbar spinal stenosis    Mild cardiomegaly    Multiple lung nodules on CT    NICM (nonischemic cardiomyopathy) (HCC)    a.) TTE 10/23/2015: EF 60-65; b.) TTE 08/05/2022: EF 45-50%   Orchitis and epididymitis 06/16/2013   OSA on CPAP    Palindromic rheumatism, hand    Pericardial effusion    Prostatitis    Sepsis (HCC) 08/2022   Subdural hematoma (  HCC) 10/22/2015   a.) s/p traumatic mechanical fall --> CT head 10/22/2015: high-density SDH the left cerebral convexity with13 mm midline shift --> s/p LEFT frontal burr hole craniotomy   Tubular adenoma of colon    Ventral hernia     SURGICAL HISTORY: Past Surgical History:  Procedure Laterality Date   BACK SURGERY     BIOPSY  08/21/2022   Procedure: BIOPSY;  Surgeon: Jaynie Collins, DO;  Location: [facility] ENDOSCOPY;  Service: Gastroenterology;;   BIOPSY  08/23/2022   Procedure: BIOPSY;  Surgeon: Jaynie Collins, DO;  Location[facility] ENDOSCOPY;  Service: Gastroenterology;;   BIOPSY  02/28/2023   Procedure: BIOPSY;  Surgeon: Jaynie Collins, DO;  Location: [facility] ENDOSCOPY;  Service: Gastroenterology;;   Guss Bunde OF CRANIUM Left  10/22/2015   COLON SURGERY  09/28/2022   COLONOSCOPY WITH PROPOFOL N/A 08/23/2022   Procedure: COLONOSCOPY WITH PROPOFOL;  Surgeon: Jaynie Collins, DO;  Location: [facility] ENDOSCOPY;  Service: Gastroenterology;  Laterality: N/A;   COLONOSCOPY WITH PROPOFOL N/A 02/28/2023   Procedure: COLONOSCOPY WITH PROPOFOL;  Surgeon: Jaynie Collins, DO;  Location: [facility] ENDOSCOPY;  Service: Gastroenterology;  Laterality: N/A;  DM   COLONOSCOPY WITH PROPOFOL N/A 04/25/2023   Procedure: COLONOSCOPY WITH PROPOFOL;  Surgeon: Jaynie Collins, DO;  Location: [facility] ENDOSCOPY;  Service: Gastroenterology;  Laterality: N/A;  DM   ESOPHAGOGASTRODUODENOSCOPY (EGD) WITH PROPOFOL N/A 08/21/2022   Procedure: ESOPHAGOGASTRODUODENOSCOPY (EGD) WITH PROPOFOL;  Surgeon: Jaynie Collins, DO;  Location: [facility] ENDOSCOPY;  Service: Gastroenterology;  Laterality: N/A;   ESOPHAGOGASTRODUODENOSCOPY (EGD) WITH PROPOFOL N/A 02/28/2023   Procedure: ESOPHAGOGASTRODUODENOSCOPY (EGD) WITH PROPOFOL;  Surgeon: Jaynie Collins, DO;  Location: [facility] ENDOSCOPY;  Service: Gastroenterology;  Laterality: N/A;   GIVENS CAPSULE STUDY N/A 08/23/2022   Procedure: GIVENS CAPSULE STUDY;  Surgeon: Jaynie Collins, DO;  Location: [facility] ENDOSCOPY;  Service: Gastroenterology;  Laterality: N/A;   HERNIA REPAIR     INGUINAL HERNIA REPAIR Right 03/09/2023   Procedure: HERNIA REPAIR INGUINAL ADULT, open, RNFA to assist;  Surgeon: Leafy Ro, MD;  Location: [facility] ORS;  Service: General;  Laterality: Right;   IRRIGATION AND DEBRIDEMENT ABSCESS Left 08/28/2022   Procedure: IRRIGATION AND DEBRIDEMENT ABSCESS LEFT UPPER THIGH/GROIN;  Surgeon: Leafy Ro, MD;  Location: [facility] ORS;  Service: General;  Laterality: Left;   LAPAROSCOPIC RIGHT COLECTOMY Right 09/28/2022   Procedure: LAPAROSCOPIC RIGHT COLECTOMY, RNFA to assist;  Surgeon: Leafy Ro, MD;  Location: [facility] ORS;  Service: General;  Laterality: Right;   POLYPECTOMY  08/23/2022    Procedure: POLYPECTOMY;  Surgeon: Jaynie Collins, DO;  Location: [facility] ENDOSCOPY;  Service: Gastroenterology;;   POLYPECTOMY  04/25/2023   Procedure: POLYPECTOMY;  Surgeon: Jaynie Collins, DO;  Location: [facility] ENDOSCOPY;  Service: Gastroenterology;;   SUBMUCOSAL TATTOO INJECTION  08/23/2022   Procedure: SUBMUCOSAL TATTOO INJECTION;  Surgeon: Jaynie Collins, DO;  Location: [facility] ENDOSCOPY;  Service: Gastroenterology;;   TONSILLECTOMY     VENTRAL HERNIA REPAIR N/A 09/28/2022   Procedure: HERNIA REPAIR VENTRAL ADULT;  Surgeon: Leafy Ro, MD;  Location: [facility] ORS;  Service: General;  Laterality: N/A;    SOCIAL HISTORY: Social History   Socioeconomic History   Marital status: Married    Spouse name: Not on file   Number of children: Not on file   Years of education: Not on file   Highest education level: 5th grade  Occupational History   Not on file  Tobacco Use   Smoking status: Former  Types: Cigarettes    Passive exposure: Past   Smokeless tobacco: Never   Tobacco comments:    Stop smoking iver 40  plus years ago  Vaping Use   Vaping status: Never Used  Substance and Sexual Activity   Alcohol use: No   Drug use: No   Sexual activity: Not Currently  Other Topics Concern   Not on file  Social History Narrative   Not on file   Social Drivers of Health   Financial Resource Strain: Patient Declined (04/26/2023)   Received from [facility]   Overall Financial Resource Strain (CARDIA)    Difficulty of Paying Living Expenses: Patient declined  Recent Concern: Financial Resource Strain - Medium Risk (02/24/2023)   Received from [facility]   Overall Financial Resource Strain (CARDIA)    Difficulty of Paying Living Expenses: Somewhat hard  Food Insecurity: Patient Declined (04/26/2023)   Received from [facility]   Hunger Vital Sign    Worried About Running Out of Food in the Last Year: Patient declined     Ran Out of Food in the Last Year: Patient declined  Recent Concern: Food Insecurity - Food Insecurity Present (02/24/2023)   Received from [facility]   Hunger Vital Sign    Worried About Running Out of Food in the Last Year: Sometimes true    Ran Out of Food in the Last Year: Sometimes true  Transportation Needs: No Transportation Needs (04/26/2023)   Received from [facility] - Transportation    In the past 12 months, has lack of transportation kept you from medical appointments or from getting medications?: No    Lack of Transportation (Non-Medical): No  Physical Activity: Unknown (07/26/2022)   Exercise Vital Sign    Days of Exercise per Week: Patient declined    Minutes of Exercise per Session: Not on file  Stress: Stress Concern Present (07/26/2022)   [employer] of Occupational Health - Occupational Stress Questionnaire    Feeling of Stress : To some extent  Social Connections: Socially Integrated (12/20/2022)   Social Connection and Isolation Panel [NHANES]    Frequency of Communication with Friends and Family: More than three times a week    Frequency of Social Gatherings with Friends and Family: Patient declined    Attends Religious Services: More than 4 times per year    Active Member of [employer] or Organizations: Yes    Attends Banker Meetings: Never    Marital Status: Married  Catering manager Violence: Not At Risk (09/28/2022)   Humiliation, Afraid, Rape, and Kick questionnaire    Fear of Current or Ex-Partner: No    Emotionally Abused: No    Physically Abused: No    Sexually Abused: No    FAMILY HISTORY: Family History  Problem Relation Age of Onset   Cancer Maternal Aunt    ADD / ADHD Daughter    Asthma Son    Prostate cancer Neg Hx    Kidney disease Neg Hx    Kidney cancer Neg Hx    Bladder Cancer Neg Hx     ALLERGIES:  has no known allergies.  MEDICATIONS:  Current Outpatient Medications   Medication Sig Dispense Refill   aspirin EC 81 MG tablet Take 1 tablet (81 mg total) by mouth daily. Swallow whole.     furosemide (LASIX) 20 MG tablet Take 1 tablet (20 mg total) by mouth daily. 30 tablet 6  hydrALAZINE (APRESOLINE) 50 MG tablet Take 2 tablets (100 mg total) by mouth 2 (two) times daily. 180 tablet 3   levothyroxine (SYNTHROID) 200 MCG tablet Take 1 tablet (200 mcg total) by mouth daily before breakfast. 90 tablet 0   metoprolol succinate (TOPROL XL) 25 MG 24 hr tablet Take 1 tablet (25 mg total) by mouth daily. 90 tablet 1   oxyCODONE-acetaminophen (PERCOCET) 10-325 MG tablet Take 1 tablet by mouth every 8 (eight) hours as needed for pain. Must last 30 days. 90 tablet 0   rosuvastatin (CRESTOR) 40 MG tablet Take 1 tablet (40 mg total) by mouth daily. 90 tablet 1   sacubitril-valsartan (ENTRESTO) 49-51 MG Take 1 tablet by mouth 2 (two) times daily. 60 tablet 6   spironolactone (ALDACTONE) 25 MG tablet Take 0.5 tablets (12.5 mg total) by mouth daily. 45 tablet 3   calcium-vitamin D (OSCAL WITH D) 500-5 MG-MCG tablet Take 1 tablet by mouth daily with breakfast. (Patient not taking: Reported on 05/05/2023)     cyanocobalamin (VITAMIN B12) 1000 MCG tablet Take 1 tablet (1,000 mcg total) by mouth daily. (Patient not taking: Reported on 05/05/2023) 90 tablet 0   metFORMIN (GLUCOPHAGE) 500 MG tablet Take 2 tablets (1,000 mg total) by mouth 2 (two) times daily with a meal. (Patient not taking: Reported on 05/05/2023) 120 tablet 0   methocarbamol (ROBAXIN) 500 MG tablet Take 1 tablet (500 mg total) by mouth every 8 (eight) hours as needed for muscle spasms. (Patient not taking: Reported on 05/05/2023) 30 tablet 0   [START ON 05/16/2023] oxyCODONE-acetaminophen (PERCOCET) 10-325 MG tablet Take 1 tablet by mouth every 8 (eight) hours as needed for pain. Must last 30 days. (Patient not taking: Reported on 04/28/2023) 90 tablet 0   zanubrutinib (BRUKINSA) 80 MG capsule Take 2 capsules (160 mg total)  by mouth 2 (two) times daily. (Patient not taking: Reported on 05/12/2023) 120 capsule 2   No current facility-administered medications for this visit.    Review of Systems  Constitutional:  Positive for fatigue. Negative for appetite change, chills, fever and unexpected weight change.  HENT:   Negative for hearing loss and voice change.   Eyes:  Negative for eye problems and icterus.  Respiratory:  Negative for chest tightness, cough and shortness of breath.   Cardiovascular:  Negative for chest pain and leg swelling.  Gastrointestinal:  Negative for abdominal distention and abdominal pain.  Endocrine: Negative for hot flashes.  Genitourinary:  Negative for difficulty urinating, dysuria and frequency.   Musculoskeletal:  Positive for arthralgias.  Skin:  Negative for itching and rash.  Neurological:  Negative for light-headedness and numbness.  Hematological:  Negative for adenopathy. Does not bruise/bleed easily.  Psychiatric/Behavioral:  Negative for confusion.      PHYSICAL EXAMINATION: ECOG PERFORMANCE STATUS: 1 - Symptomatic but completely ambulatory  Vitals:   05/12/23 0945  BP: (!) 153/70  Pulse: 70  Resp: 18  Temp: (!) 97.1 F (36.2 C)  SpO2: 99%   Filed Weights   05/12/23 0945  Weight: 223 lb 4.8 oz (101.3 kg)    Physical Exam Constitutional:      General: He is not in acute distress.    Appearance: He is obese. He is not diaphoretic.  HENT:     Head: Normocephalic and atraumatic.  Eyes:     General: No scleral icterus.    Pupils: Pupils are equal, round, and reactive to light.  Cardiovascular:     Rate and Rhythm: Normal rate.  Heart sounds: No murmur heard. Pulmonary:     Effort: Pulmonary effort is normal. No respiratory distress.     Breath sounds: No wheezing.  Abdominal:     General: There is no distension.     Palpations: Abdomen is soft.  Genitourinary:    Comments:   Musculoskeletal:     Cervical back: Normal range of motion and neck  supple.  Skin:    General: Skin is warm and dry.     Findings: No erythema.  Neurological:     Mental Status: He is alert and oriented to person, place, and time. Mental status is at baseline.     Cranial Nerves: No cranial nerve deficit.     Motor: No abnormal muscle tone.  Psychiatric:        Mood and Affect: Mood and affect normal.     LABORATORY DATA:  I have reviewed the data as listed    Latest Ref Rng & Units 05/12/2023    9:28 AM 05/05/2023   12:56 PM 04/27/2023   12:58 PM  CBC  WBC 4.0 - 10.5 K/uL 6.4   13.1   Hemoglobin 13.0 - 17.0 g/dL 8.9  8.5  8.4   Hematocrit 39.0 - 52.0 % 29.3  28.6  28.4   Platelets 150 - 400 K/uL 194   278       Latest Ref Rng & Units 05/12/2023    9:28 AM 04/21/2023    1:04 PM 04/11/2023   11:41 AM  CMP  Glucose 70 - 99 mg/dL 161  096  045   BUN 8 - 23 mg/dL 15  13  14    Creatinine 0.61 - 1.24 mg/dL 4.09  8.11  9.14   Sodium 135 - 145 mmol/L 139  138  137   Potassium 3.5 - 5.1 mmol/L 3.9  3.4  3.5   Chloride 98 - 111 mmol/L 108  103  103   CO2 22 - 32 mmol/L 23  25  23    Calcium 8.9 - 10.3 mg/dL 8.5  8.3  8.3   Total Protein 6.5 - 8.1 g/dL 6.4  6.4  6.4   Total Bilirubin 0.0 - 1.2 mg/dL 1.2  0.5  0.8   Alkaline Phos 38 - 126 U/L 83  67  68   AST 15 - 41 U/L 54  46  51   ALT 0 - 44 U/L 27  25  25       RADIOGRAPHIC STUDIES: I have personally reviewed the radiological images as listed and agreed with the findings in the report. CT Angio Chest Pulmonary Embolism (PE) W or WO Contrast Result Date: 04/27/2023 CLINICAL DATA:  Chest pain. History of chronic lymphocytic leukemia. EXAM: CT ANGIOGRAPHY CHEST WITH CONTRAST TECHNIQUE: Multidetector CT imaging of the chest was performed using the standard protocol during bolus administration of intravenous contrast. Multiplanar CT image reconstructions and MIPs were obtained to evaluate the vascular anatomy. RADIATION DOSE REDUCTION: This exam was performed according to the departmental  dose-optimization program which includes automated exposure control, adjustment of the mA and/or kV according to patient size and/or use of iterative reconstruction technique. CONTRAST:  75mL OMNIPAQUE IOHEXOL 350 MG/ML SOLN COMPARISON:  April 26, 2023. FINDINGS: Cardiovascular: Satisfactory opacification of the pulmonary arteries to the segmental level. No evidence of pulmonary embolism. Mild cardiomegaly is noted. Minimal pericardial effusion. Mediastinum/Nodes: No enlarged mediastinal, hilar, or axillary lymph nodes. Thyroid gland, trachea, and esophagus demonstrate no significant findings. Lungs/Pleura: No pneumothorax is noted. Small left pleural  effusion is noted. Airspace opacity is noted in lingular segment of left upper lobe most consistent with pneumonia. Airspace opacity is also noted in left lower lobe concerning for pneumonia. Upper Abdomen: No acute abnormality. Musculoskeletal: No chest wall abnormality. No acute or significant osseous findings. Review of the MIP images confirms the above findings. IMPRESSION: No definite evidence of pulmonary embolus. Small left pleural effusion is noted. Airspace opacities are noted in lingular segment of left upper lobe as well as in left lower lobe most consistent with pneumonia. Minimal pericardial effusion. Aortic Atherosclerosis (ICD10-I70.0). Electronically Signed   By: Rosalene Colon M.D.   On: 04/27/2023 10:53   CT CHEST WO CONTRAST Result Date: 04/27/2023 CLINICAL DATA:  Chronic cough EXAM: CT CHEST WITHOUT CONTRAST TECHNIQUE: Multidetector CT imaging of the chest was performed following the standard protocol without IV contrast. RADIATION DOSE REDUCTION: This exam was performed according to the departmental dose-optimization program which includes automated exposure control, adjustment of the mA and/or kV according to patient size and/or use of iterative reconstruction technique. COMPARISON:  Chest x-ray August 28, 2022 FINDINGS: Cardiovascular: No  significant vascular findings. Normal heart size. No pericardial effusion. Mild coronary artery calcifications. Small pericardial effusion. Correlation with echocardiography recommended as clinically needed. Mediastinum/Nodes: Small adenopathy in the retrocaval pretracheal space measuring 1.5 by 1.1 cm. Consistent with some mild reactive adenopathy. Aortic pulmonic window adenopathy measuring 1.6 x 1 cm. No significant hilar adenopathy. No other mediastinal masses. Lungs/Pleura: There is a small left pleural reaction and pleural effusion. With ill-defined consolidating lingula, infiltrates as well as infiltrates involving the inferior margin of the anterior segment of the left upper lobe. Infiltrates also in the left lower lobe with hypoventilatory atelectatic changes and some volume loss Ill-defined reticulonodular infiltrates are also present in the right lower lobe without consolidation and in the lateral segment of the right middle lobe. Findings consistent with a bronchopneumonia infiltrates. Upper Abdomen: No acute abnormality. Musculoskeletal: No chest wall mass or suspicious bone lesions identified. IMPRESSION: Pulmonary infiltrates as described and left pleural effusion correlate with bronchopneumonia. Follow-up recommended as clinically needed. Small pericardial effusion, correlation with echocardiography recommended as clinically needed. Electronically Signed   By: Fredrich Jefferson M.D.   On: 04/27/2023 08:57

## 2023-05-12 NOTE — Assessment & Plan Note (Signed)
 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 Lab Results  Component Value Date   HGB 8.9 (L) 05/12/2023   TIBC 309 05/12/2023   IRONPCTSAT 14 (L) 05/12/2023   FERRITIN 81 05/12/2023   recurrent iron deficiency anemia.   Proceed with IV venofer today.  Colonoscopy findings were reviewed. No obvious source of bleeding.

## 2023-05-12 NOTE — Assessment & Plan Note (Signed)
 Encourage oral hydration and avoid nephrotoxins.  Cr is worse, likely due to recent contrast study

## 2023-05-13 ENCOUNTER — Other Ambulatory Visit: Payer: Self-pay

## 2023-05-13 ENCOUNTER — Emergency Department
Admission: EM | Admit: 2023-05-13 | Discharge: 2023-05-13 | Disposition: A | Discharge status: Home / Self Care | Attending: Emergency Medicine | Admitting: Emergency Medicine

## 2023-05-13 ENCOUNTER — Inpatient Hospital Stay

## 2023-05-13 ENCOUNTER — Telehealth: Payer: Self-pay

## 2023-05-13 ENCOUNTER — Emergency Department

## 2023-05-13 DIAGNOSIS — Y92410 Unspecified street and highway as the place of occurrence of the external cause: Secondary | ICD-10-CM | POA: Insufficient documentation

## 2023-05-13 DIAGNOSIS — S63696A Other sprain of right little finger, initial encounter: Secondary | ICD-10-CM | POA: Insufficient documentation

## 2023-05-13 DIAGNOSIS — S63501A Unspecified sprain of right wrist, initial encounter: Secondary | ICD-10-CM | POA: Diagnosis not present

## 2023-05-13 DIAGNOSIS — S63502A Unspecified sprain of left wrist, initial encounter: Secondary | ICD-10-CM | POA: Diagnosis not present

## 2023-05-13 DIAGNOSIS — S40022A Contusion of left upper arm, initial encounter: Secondary | ICD-10-CM | POA: Diagnosis not present

## 2023-05-13 DIAGNOSIS — S6991XA Unspecified injury of right wrist, hand and finger(s), initial encounter: Secondary | ICD-10-CM | POA: Diagnosis present

## 2023-05-13 MED ORDER — METHOCARBAMOL 750 MG PO TABS: 750.0000 mg | ORAL_TABLET | Freq: Three times a day (TID) | ORAL | 0 refills | Status: DC | PRN

## 2023-05-13 MED ORDER — ACETAMINOPHEN 500 MG PO TABS
1000.0000 mg | ORAL_TABLET | Freq: Once | ORAL | Status: AC
  Administered 2023-05-13: 1000 mg via ORAL
  Filled 2023-05-13: qty 2

## 2023-05-13 NOTE — Discharge Instructions (Signed)
 Please buddy tape right 4th and 5th digits together.  Rest ice and elevate the right hand.  Buddy tape digits for 2 to 3 weeks.  Follow-up with orthopedics if no improvement 1 week.  Use muscle relaxers as needed for muscle spasms and tightness.  Return to the ER for any worsening symptoms or any urgent changes in her health

## 2023-05-13 NOTE — Telephone Encounter (Signed)
-----   Message from [name] sent at 05/12/2023  7:56 PM EDT ----- Please add Venofer  encounter to his next appt with me thanks.
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
## 2023-05-13 NOTE — Telephone Encounter (Signed)
 Paul Briggs will you please add Venofer  encounter to his next appt with Dr. Wilhelmenia Harada.

## 2023-05-16 NOTE — Progress Notes (Deleted)
 Advanced Heart Failure Clinic Note  PCP: Lenell Query, PA-C PCP-Cardiologist: Constancia Delton, MD HF-Cardiologist: Jules Oar, MD  HPI:  Paul Haithcock [name] is a 78 y.o. 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     The patient was initially seen 05/2022 by CHF team for HTN and he was started on Diovan  160mg  daily. EKG showed LBBB and an echo was ordered. Echo showed LVEF 45-50%, no WMA, moderate to severe LVH with speckled appearance, consider for amyloidosis, severely dilated left atrium, mildly dilated right atrium, small pericardial effusion with no tamponade, mild MR. Plan was for cardiac CTA and cardiac MRI.  Cardiac CTA showed coronary calcium  score of 14.3, 12th percentile for age and sex matched control, minimal proximal LAD stenosis, overall minimal nonobstructive CAD.   Patient was admitted August 26, 2022 with a mechanical fall fall, cellulitis of the left groin/SIRS, and mildly elevated troponin. He was briefly seen by cardiology and felt to be stable. No inpatient cardiac work-up was pursued.    The patient was last seen 09/17/22 for pre-op for laparoscopic colectomy 09/28/22. He felt weak since being discharged from the hospital, but was overall stable from a cardiac perspective. He was encouraged to get the cardiac MRI done.    cMRI 03/2023 EF 43% Mod-sev basal septal hypertrophy (16mm) RVEF 48% No LGE Mild MR .Findings suggest non ischemic cardiomyopathy. Possible inflammatory disease involving the basal lateral wall.   Last seen by Dr. Julane Briggs 05/05/23. Reported he had ran out of most of his meds for past 1-2 week.  Today Paul Grave [name] returns to Heart Failure Clinic for pharmacist medication titration. Reports feeling ***. {Reports/Denies:210917258} {ACTIONS;DENIES/REPORTS:21021675::"Denies"} being able to complete all activities  of daily living (ADLs). Is *** active throughout the day. Weight at home is *** pounds. Takes {CHL AMB AHFC Medications:210917260} ***. Appetite ***. {Does Follow/Does Not Follow:210917261} a low sodium diet.  Current Heart Failure Medications: Loop diuretic: Beta-Blocker: ACEI/ARB/ARNI: MRA: SGLT2i: Other:  Has the patient been experiencing any side effects to the medications prescribed? {yes/no:20286}  Does the patient have any problems obtaining medications due to transportation or finances? {yes/no:20286}  Understanding of regimen: {CHL AMB AHFC Excellent/Good/Fair/Poor:210917262}  Understanding of indications: {CHL AMB [facility] Excellent/Good/Fair/Poor:210917262}  Potential of adherence: {CHL AMB [facility] Excellent/Good/Fair/Poor:210917262}  Patient understands to avoid NSAIDs.  Patient understands to avoid decongestants.  Pertinent Lab Values: Creatinine  Date Value Ref Range Status  05/12/2023 1.43 (H) 0.61 - 1.24 mg/dL Final   Creat  Date Value Ref Range Status  07/06/2021 1.08 0.70 - 1.28 mg/dL Final   BUN  Date Value Ref Range Status  05/12/2023 15 8 - 23 mg/dL Final  16/10/9602 12 8 - 27 mg/dL Final  54/09/8117 27 (H) 7 - 18 mg/dL Final   Potassium  Date Value Ref Range Status  05/12/2023 3.9 3.5 - 5.1 mmol/L Final  10/06/2013 4.1 3.5 - 5.1 mmol/L Final   Sodium  Date Value Ref Range Status  05/12/2023 139 135 - 145 mmol/L Final  12/22/2022 146 (H) 134 - 144 mmol/L Final  10/06/2013 139 136 - 145 mmol/L Final   Magnesium  Date Value Ref Range Status  10/06/2022 2.1 1.7 - 2.4 mg/dL Final    Comment:    Performed at [facility], [address], [place], [place] [address]   TSH  Date Value Ref Range Status  12/22/2022 27.300 (H) 0.450 -  4.500 uIU/mL Final    Vital Signs: There were no vitals filed for this visit.  Assessment/Plan: 1. Chronic systolic HF due to NICM  - Echo 05/2022 LVEF 45-50%, no WMA, moderate to severe LVH, severely dilated  left atrium, mildly dilated right atrium, small pericardial effusion with no tamponade, mild MR. - Cardiac CTA CAC 14.3,minimal proximal LAD stenosis, overall minimal nonobstructive CAD. - cMRI 03/2023 EF 43% Mod-sev basal septal hypertrophy (16mm) RVEF 48% No LGE Mild MR .Findings suggest non ischemic cardiomyopathy. Possible inflammatory disease involving the basal lateral wall. - most likely etiology is HTN cardiomyopathy but cMRI also raises possibility of sarcoid (CLL treatment or LBBB etiology less likely) - Volume OK - NYHA II - He is out of all of his meds - Will start Entresto  49/51 bid - Spiro 12.5 daily - Stressed need for med compliance  - Proceed with cardiac PET to evaluate for sarcoid Refer to HF PharmD for help with med access, titration and compliance    2. HTN, severe - meds as above - consider sleep study   3. CLL - Oncology following  Follow up: ***  ***

## 2023-05-17 ENCOUNTER — Ambulatory Visit
Admission: RE | Admit: 2023-05-17 | Discharge: 2023-05-17 | Disposition: A | Discharge status: Home / Self Care | Source: Ambulatory Visit | Attending: Student | Admitting: Student

## 2023-05-17 DIAGNOSIS — M2540 Effusion, unspecified joint: Secondary | ICD-10-CM | POA: Diagnosis not present

## 2023-05-17 DIAGNOSIS — M75121 Complete rotator cuff tear or rupture of right shoulder, not specified as traumatic: Secondary | ICD-10-CM | POA: Insufficient documentation

## 2023-05-17 DIAGNOSIS — M75101 Unspecified rotator cuff tear or rupture of right shoulder, not specified as traumatic: Secondary | ICD-10-CM | POA: Insufficient documentation

## 2023-05-17 DIAGNOSIS — M12811 Other specific arthropathies, not elsewhere classified, right shoulder: Secondary | ICD-10-CM | POA: Diagnosis present

## 2023-05-17 DIAGNOSIS — M19011 Primary osteoarthritis, right shoulder: Secondary | ICD-10-CM | POA: Diagnosis not present

## 2023-05-17 DIAGNOSIS — R601 Generalized edema: Secondary | ICD-10-CM | POA: Diagnosis not present

## 2023-05-18 ENCOUNTER — Telehealth: Payer: Self-pay | Admitting: Pharmacist

## 2023-05-18 ENCOUNTER — Ambulatory Visit: Admitting: Oncology

## 2023-05-18 DIAGNOSIS — J189 Pneumonia, unspecified organism: Secondary | ICD-10-CM | POA: Diagnosis not present

## 2023-05-18 DIAGNOSIS — J188 Other pneumonia, unspecified organism: Secondary | ICD-10-CM | POA: Diagnosis not present

## 2023-05-18 DIAGNOSIS — R053 Chronic cough: Secondary | ICD-10-CM | POA: Diagnosis not present

## 2023-05-18 NOTE — Telephone Encounter (Signed)
 Called to confirm/remind patient of their appointment at the Advanced Heart Failure Clinic on 05/19/23.   Appointment:   [x] Confirmed  [] Left mess   [] No answer/No voice mail  [] VM Full/unable to leave message  [] Phone not in service  Patient reminded to bring all medications and/or complete list.  Confirmed patient has transportation. Gave directions, instructed to utilize valet parking.

## 2023-05-19 ENCOUNTER — Other Ambulatory Visit

## 2023-05-19 ENCOUNTER — Other Ambulatory Visit (HOSPITAL_COMMUNITY): Payer: Self-pay

## 2023-05-19 ENCOUNTER — Telehealth: Payer: Self-pay

## 2023-05-19 NOTE — Telephone Encounter (Signed)
 Advanced Heart Failure Patient Advocate Encounter  Test billing for GLP1 products returned the following coverage:  $0 copay for 28 days: Mounjaro, Ozempic, Trulicity  Not Covered: Saxenda, Wegovy, Zepbound   Stephaine H, CPhT Rx Patient Advocate Phone: [phone]

## 2023-05-23 ENCOUNTER — Telehealth: Payer: Self-pay | Admitting: Pharmacist

## 2023-05-23 ENCOUNTER — Encounter: Payer: Self-pay | Admitting: Oncology

## 2023-05-23 NOTE — Telephone Encounter (Signed)
 Called to confirm/remind patient of their appointment at the Advanced Heart Failure Clinic on 05/24/23.   Appointment:   [] Confirmed  [x] Left mess   [] No answer/No voice mail  [] VM Full/unable to leave message  [] Phone not in service  Patient reminded to bring all medications and/or complete list.  Confirmed patient has transportation. Gave directions, instructed to utilize valet parking.

## 2023-05-24 ENCOUNTER — Ambulatory Visit: Attending: Cardiology | Admitting: Pharmacist

## 2023-05-24 ENCOUNTER — Other Ambulatory Visit: Payer: Self-pay

## 2023-05-24 ENCOUNTER — Other Ambulatory Visit: Payer: Self-pay | Admitting: *Deleted

## 2023-05-24 VITALS — BP 144/72 | HR 69 | Wt 223.2 lb

## 2023-05-24 DIAGNOSIS — N183 Chronic kidney disease, stage 3 unspecified: Secondary | ICD-10-CM | POA: Insufficient documentation

## 2023-05-24 DIAGNOSIS — K219 Gastro-esophageal reflux disease without esophagitis: Secondary | ICD-10-CM | POA: Diagnosis not present

## 2023-05-24 DIAGNOSIS — I447 Left bundle-branch block, unspecified: Secondary | ICD-10-CM | POA: Diagnosis not present

## 2023-05-24 DIAGNOSIS — I5022 Chronic systolic (congestive) heart failure: Secondary | ICD-10-CM | POA: Diagnosis present

## 2023-05-24 DIAGNOSIS — I2510 Atherosclerotic heart disease of native coronary artery without angina pectoris: Secondary | ICD-10-CM | POA: Insufficient documentation

## 2023-05-24 DIAGNOSIS — Z79899 Other long term (current) drug therapy: Secondary | ICD-10-CM | POA: Diagnosis not present

## 2023-05-24 DIAGNOSIS — C9110 Chronic lymphocytic leukemia of B-cell type not having achieved remission: Secondary | ICD-10-CM | POA: Diagnosis not present

## 2023-05-24 DIAGNOSIS — I130 Hypertensive heart and chronic kidney disease with heart failure and stage 1 through stage 4 chronic kidney disease, or unspecified chronic kidney disease: Secondary | ICD-10-CM | POA: Diagnosis not present

## 2023-05-24 DIAGNOSIS — Z7984 Long term (current) use of oral hypoglycemic drugs: Secondary | ICD-10-CM | POA: Insufficient documentation

## 2023-05-24 DIAGNOSIS — I428 Other cardiomyopathies: Secondary | ICD-10-CM | POA: Insufficient documentation

## 2023-05-24 DIAGNOSIS — E1122 Type 2 diabetes mellitus with diabetic chronic kidney disease: Secondary | ICD-10-CM | POA: Diagnosis not present

## 2023-05-24 MED ORDER — SACUBITRIL-VALSARTAN 97-103 MG PO TABS: 1.0000 | ORAL_TABLET | Freq: Two times a day (BID) | ORAL | 3 refills | Status: DC

## 2023-05-24 MED ORDER — EMPAGLIFLOZIN 10 MG PO TABS
10.0000 mg | ORAL_TABLET | Freq: Every day | ORAL | 2 refills | Status: AC
Start: 2023-05-24 — End: ?

## 2023-05-24 NOTE — Progress Notes (Signed)
 Advanced Heart Failure Clinic Note  PCP: Lenell Query, PA-C PCP-Cardiologist: Constancia Delton, MD HF-Cardiologist: Jules Oar, MD  HPI:  Paul Alberico [name] is a 78 y.o. 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     The patient was initially seen 05/2022 by CHF team for HTN and he was started on Diovan  160mg  daily. EKG showed LBBB and an echo was ordered. Echo showed LVEF 45-50%, no WMA, moderate to severe LVH with speckled appearance, consider for amyloidosis, severely dilated left atrium, mildly dilated right atrium, small pericardial effusion with no tamponade, mild MR. Plan was for cardiac CTA and cardiac MRI.  Cardiac CTA showed coronary calcium  score of 14.3, 12th percentile for age and sex matched control, minimal proximal LAD stenosis, overall minimal nonobstructive CAD.   Patient was admitted August 26, 2022 with a mechanical fall fall, cellulitis of the left groin/SIRS, and mildly elevated troponin. He was briefly seen by cardiology and felt to be stable. No inpatient cardiac work-up was pursued.    The patient was seen 09/17/22 for pre-op for laparoscopic colectomy 09/28/22. He felt weak since being discharged from the hospital, but was overall stable from a cardiac perspective. He was encouraged to get the cardiac MRI done.    cMRI 03/2023 showed EF 43% Mod-sev basal septal hypertrophy (16mm) RVEF 48% No LGE Mild MR .Findings suggest non ischemic cardiomyopathy. Possible inflammatory disease involving the basal lateral wall.   Last seen by Dr. Julane Ny 05/05/23. Reported he had ran out of most of his meds for 1-2 weeks. His medications were restarted. Labs collected ~2 weeks later were stable.  Today Paul Grave [name] returns to Heart Failure Clinic for pharmacist medication titration. Reports feeling fine. Reports occasional shortness of  breath and fatigue. Also reports acid reflux. Denies dizziness, lightheadedness, chest pain, palpitations, LEE, orthostasis, and PND. Reports being able to complete all activities of daily living (ADLs). Is very active throughout the day. Still works in [employer]. Weight is not measured at home. Takes furosemide  20 mg daily. Appetite is variable. Somewhat follows a low sodium diet.  Current Heart Failure Medications: Loop diuretic: furosemide  20 mg daily Beta-Blocker: metoprolol  succinate 25 mg daily ACEI/ARB/ARNI: Entresto  49-51 mg BID MRA: spironolactone  12.5 mg daily SGLT2i: none Other: hydralazine  100 mg TID  Has the patient been experiencing any side effects to the medications prescribed? No  Does the patient have any problems obtaining medications due to transportation or finances? No  Understanding of regimen: Poor. Extensive time today was spent reviewing his medication list today. The patient did not bring his medication bottles today and did not know which ones he takes at home. He asserts that he takes them as prescribed on the bottle. He expressed frustration that pharmacies do not include the indication on the medication bottle anymore.  Understanding of indications: Poor. Medication indications were reviewed in detail today.  Potential of adherence: Fair  Patient understands to avoid NSAIDs.  Patient understands to avoid decongestants.  Pertinent Lab Values: Creatinine  Date Value Ref Range Status  05/12/2023 1.43 (H) 0.61 - 1.24 mg/dL Final   Creat  Date Value Ref Range Status  07/06/2021 1.08 0.70 - 1.28 mg/dL Final   BUN  Date Value Ref Range Status  05/12/2023 15 8 - 23 mg/dL Final  16/10/9602 12 8 - 27 mg/dL Final  54/09/8117 27 (H) 7 - 18 mg/dL Final  Potassium  Date Value Ref Range Status  05/12/2023 3.9 3.5 - 5.1 mmol/L Final  10/06/2013 4.1 3.5 - 5.1 mmol/L Final   Sodium  Date Value Ref Range Status  05/12/2023 139 135 - 145 mmol/L Final   12/22/2022 146 (H) 134 - 144 mmol/L Final  10/06/2013 139 136 - 145 mmol/L Final   Magnesium  Date Value Ref Range Status  10/06/2022 2.1 1.7 - 2.4 mg/dL Final    Comment:    Performed at [facility], [address] [place], [place] [address]   TSH  Date Value Ref Range Status  12/22/2022 27.300 (H) 0.450 - 4.500 uIU/mL Final  CMP 05/18/23 showed K of 3.9 and creatinine of 1.3.  Vital Signs: Today's Vitals   05/24/23 1303  BP: (!) 144/72  Pulse: 69  SpO2: 98%  Weight: 223 lb 3.2 oz (101.2 kg)    Assessment/Plan: 1. Chronic systolic HF due to NICM  - Echo 05/2022 LVEF 45-50%, no WMA, moderate to severe LVH, severely dilated left atrium, mildly dilated right atrium, small pericardial effusion with no tamponade, mild MR. - Cardiac CTA CAC 14.3,minimal proximal LAD stenosis, overall minimal nonobstructive CAD. - cMRI 03/2023 EF 43% Mod-sev basal septal hypertrophy (16mm) RVEF 48% No LGE Mild MR .Findings suggest non ischemic cardiomyopathy. Possible inflammatory disease involving the basal lateral wall. - most likely etiology is HTN cardiomyopathy but cMRI also raises possibility of sarcoid (CLL treatment or LBBB etiology less likely). PET scan scheduled this month. - Volume OK. Weight is down 7 lbs from last visit. Continue furosemide  20 mg daily at this time, however, it may require reduction with further titration of GDMT. - NYHA II - Increase Entresto  to 97/103 mg BID given persistent hypertension and stable labs. Check BMET next visit. - Continue metoprolol  succinate 25 mg daily. HR <70. - Continue spironolactone  12.5 daily. Can consider increasing next visit. - Start Jardiance 10 mg daily - Continue hydralazine  100 mg TID - Reviewed medication instructions, benefits, and indications in detail today. Stressed need for medication adherence. Patient will bring medication bottles to his next appointment for review. - Proceed with cardiac PET to evaluate for  sarcoid Refer to HF PharmD for help with med access, titration and compliance    2. HTN, severe - meds as above - consider sleep study   3. CLL - Oncology following  4. Acid Reflux - Provided nonpharmacologic advice on how to avoid acid reflux.  - Use Tums prn  Follow up: 1 week with CHF pharamcy  Please do not hesitate to reach out with questions or concerns,  Bevely Brush, PharmD, CPP, BCPS Heart Failure Pharmacist  Phone - [phone] 05/24/2023 2:08 PM

## 2023-05-24 NOTE — Patient Instructions (Signed)
 It was a pleasure seeing you today!  MEDICATIONS: -Increase Entresto  to 97/103 mg tablet twice daily. You can take two of your Entresto  49/51 mg tablets twice daily until you run out. -Start Jardiance 10 mg daily -Call if you have questions about your medications.  LABS: -We will call you if your labs need attention.  NEXT APPOINTMENT: Return to clinic 1 week to see Alissia Lory.  In general, to take care of your heart failure: -Limit your fluid intake to 2 Liters (half-gallon) per day.   -Limit your salt intake to ideally 2-3 grams (2000-3000 mg) per day. -Weigh yourself daily and record, and bring that "weight diary" to your next appointment.  (Weight gain of 2-3 pounds in 1 day typically means fluid weight.) -The medications for your heart are to help your heart and help you live longer.   -Please contact us  before stopping any of your heart medications.  Call the clinic at [phone] with questions or to reschedule future appointments.

## 2023-05-25 ENCOUNTER — Encounter: Payer: Self-pay | Admitting: Oncology

## 2023-05-25 ENCOUNTER — Telehealth: Payer: Self-pay | Admitting: Pharmacist

## 2023-05-25 ENCOUNTER — Inpatient Hospital Stay

## 2023-05-25 ENCOUNTER — Inpatient Hospital Stay (HOSPITAL_BASED_OUTPATIENT_CLINIC_OR_DEPARTMENT_OTHER): Admitting: Oncology

## 2023-05-25 VITALS — BP 138/63 | HR 63 | Temp 96.0°F | Resp 18 | Wt 222.8 lb

## 2023-05-25 DIAGNOSIS — N1831 Chronic kidney disease, stage 3a: Secondary | ICD-10-CM

## 2023-05-25 DIAGNOSIS — D709 Neutropenia, unspecified: Secondary | ICD-10-CM | POA: Diagnosis not present

## 2023-05-25 DIAGNOSIS — C9110 Chronic lymphocytic leukemia of B-cell type not having achieved remission: Secondary | ICD-10-CM

## 2023-05-25 DIAGNOSIS — D509 Iron deficiency anemia, unspecified: Secondary | ICD-10-CM

## 2023-05-25 LAB — CBC WITH DIFFERENTIAL (CANCER CENTER ONLY)
Abs Immature Granulocytes: 0.05 10*3/uL (ref 0.00–0.07)
Basophils Absolute: 0.1 10*3/uL (ref 0.0–0.1)
Basophils Relative: 1 %
Eosinophils Absolute: 0.2 10*3/uL (ref 0.0–0.5)
Eosinophils Relative: 3 %
HCT: 29.6 % — ABNORMAL LOW (ref 39.0–52.0)
Hemoglobin: 9.3 g/dL — ABNORMAL LOW (ref 13.0–17.0)
Immature Granulocytes: 1 %
Lymphocytes Relative: 59 %
Lymphs Abs: 3.5 10*3/uL (ref 0.7–4.0)
MCH: 27.8 pg (ref 26.0–34.0)
MCHC: 31.4 g/dL (ref 30.0–36.0)
MCV: 88.6 fL (ref 80.0–100.0)
Monocytes Absolute: 1.4 10*3/uL — ABNORMAL HIGH (ref 0.1–1.0)
Monocytes Relative: 23 %
Neutro Abs: 0.8 10*3/uL — ABNORMAL LOW (ref 1.7–7.7)
Neutrophils Relative %: 13 %
Platelet Count: 198 10*3/uL (ref 150–400)
RBC: 3.34 MIL/uL — ABNORMAL LOW (ref 4.22–5.81)
RDW: 22.1 % — ABNORMAL HIGH (ref 11.5–15.5)
Smear Review: NORMAL
WBC Count: 5.9 10*3/uL (ref 4.0–10.5)
nRBC: 0 % (ref 0.0–0.2)

## 2023-05-25 LAB — BASIC METABOLIC PANEL - CANCER CENTER ONLY
Anion gap: 8 (ref 5–15)
BUN: 16 mg/dL (ref 8–23)
CO2: 23 mmol/L (ref 22–32)
Calcium: 8.6 mg/dL — ABNORMAL LOW (ref 8.9–10.3)
Chloride: 105 mmol/L (ref 98–111)
Creatinine: 1.61 mg/dL — ABNORMAL HIGH (ref 0.61–1.24)
GFR, Estimated: 44 mL/min — ABNORMAL LOW (ref >60)
Glucose, Bld: 191 mg/dL — ABNORMAL HIGH (ref 70–99)
Potassium: 4.1 mmol/L (ref 3.5–5.1)
Sodium: 136 mmol/L (ref 135–145)

## 2023-05-25 LAB — RETIC PANEL
Immature Retic Fract: 20.2 % — ABNORMAL HIGH (ref 2.3–15.9)
RBC.: 3.35 MIL/uL — ABNORMAL LOW (ref 4.22–5.81)
Retic Count, Absolute: 107.2 10*3/uL (ref 19.0–186.0)
Retic Ct Pct: 3.2 % — ABNORMAL HIGH (ref 0.4–3.1)
Reticulocyte Hemoglobin: 28 pg (ref >27.9)

## 2023-05-25 LAB — SAMPLE TO BLOOD BANK

## 2023-05-25 MED ORDER — FUROSEMIDE 20 MG PO TABS: 20.0000 mg | ORAL_TABLET | Freq: Every day | ORAL | 6 refills | Status: DC | PRN

## 2023-05-25 MED ORDER — IRON SUCROSE 20 MG/ML IV SOLN
200.0000 mg | Freq: Once | INTRAVENOUS | Status: AC
Start: 2023-05-25 — End: 2023-05-25
  Administered 2023-05-25: 200 mg via INTRAVENOUS
  Filled 2023-05-25: qty 10

## 2023-05-25 NOTE — Progress Notes (Signed)
 Hematology/Oncology Progress note [phone] [phone]        ASSESSMENT & PLAN:   Cancer Staging  CLL (chronic lymphocytic leukemia) (HCC) Staging form: Chronic Lymphocytic Leukemia / Small Lymphocytic Lymphoma, AJCC 8th Edition - Clinical stage from 12/28/2021: Modified Rai Stage III (Modified Rai risk: High, Lymphocytosis: Present, Adenopathy: Present, Organomegaly: Absent, Anemia: Present, Thrombocytopenia: Absent) - Signed by Paul Forbes, MD on 01/20/2022   CLL (chronic lymphocytic leukemia) [facility]) Labs are reviewed and discussed with patient. CT chest abdomen pelvis results reviewed and discussed with patient.   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 Due to neutropenia, continue to hold hold off Zanubrutinib  160 mg twice daily, .   IDA (iron  deficiency anemia) Anemia is likely multifactorial, could be due to CLL, anemia due to chronic kidney disease and IDA. Lab Results  Component Value Date   HGB 9.3 (L) 05/25/2023   TIBC 309 05/12/2023   IRONPCTSAT 14 (L) 05/12/2023   FERRITIN 81 05/12/2023   recurrent iron  deficiency anemia.   Recommend Venofer  weekly x 3 Colonoscopy findings were reviewed. No obvious source of bleeding.       CKD (chronic kidney disease) stage 3, GFR 30-59 ml/min (HCC) Encourage oral hydration and avoid nephrotoxins.  Cr is worse, likely due to recent contrast study, and use of diuretics.  Recommend patient to temporarily hold off Lasix    Neutropenia (HCC) Neutropenia precaution was reviewed and discussed with patient. Hold Zanubrutinib    Orders Placed This Encounter  Procedures   CBC with Differential (Cancer Center Only)    Standing Status:   Future    Expected Date:   06/24/2023    Expiration Date:   05/24/2024   Iron  and TIBC    Standing Status:   Future    Expected Date:   06/24/2023    Expiration Date:   05/24/2024   Ferritin     Standing Status:   Future    Expected Date:   06/24/2023    Expiration Date:   05/24/2024   Retic Panel    Standing Status:   Future    Expected Date:   06/24/2023    Expiration Date:   05/24/2024   CMP (Cancer Center only)    Standing Status:   Future    Expected Date:   06/24/2023    Expiration Date:   05/24/2024   CBC with Differential (Cancer Center Only)    Standing Status:   Future    Expected Date:   06/01/2023    Expiration Date:   05/24/2024   Sample to Blood Bank    Standing Status:   Future    Expected Date:   06/01/2023    Expiration Date:   05/24/2024   Follow up   4 weeks lab Md Venofer   All questions were answered. The patient knows to call the clinic with any problems, questions or concerns.  Paul Forbes, MD, PhD [facility] Hematology Oncology 05/25/2023    CHIEF COMPLAINTS/PURPOSE OF CONSULTATION:  CLL  HISTORY OF PRESENTING ILLNESS:  Paul Grave [name] 78 y.o. male presents for follow up of CLL I have reviewed his chart and materials related to his cancer extensively and collaborated history with the patient. Summary of oncologic history is as follows: Oncology History  CLL (chronic lymphocytic leukemia) (HCC)  12/25/2021 - 12/26/2021 Hospital Admission   Patient was admitted due to pain and swelling of right ankle, right knee and left  wrist.  Patient was found to have WBC 30.4, with neutrophil 29% and lymphocyte 2%.  Peripheral smear showed leukocytosis with slight left shift in myeloid series and lymphocytosis with abnormal morphology.  Uric acid 5.5, worsening renal function with creatinine 1.35, BUN 18, GFR 55 (baseline creatinine 1.08 on 07/06/2021), lactic acid 1.1.  ESR normal, uric acid 5.5 normal, CRP 22.2, LDH 182  Orthopedic surgery was consulted s/p arthrocentesis, cell count 296 with 72% neutrophil, no crystal. Fluid negative for growth.  Patient received cefepime  and vancomycin  while in the hospital, Patient was discharged on Keflex  for 5 days.    12/28/2021 Cancer Staging   Staging form: Chronic Lymphocytic Leukemia / Small Lymphocytic Lymphoma, AJCC 8th Edition - Clinical stage from 12/28/2021: Modified Rai Stage III (Modified Rai risk: High, Lymphocytosis: Present, Adenopathy: Present, Organomegaly: Absent, Anemia: Present, Thrombocytopenia: Absent) - Signed by Paul Forbes, MD on 01/20/2022 Stage prefix: Initial diagnosis Hemoglobin (Hgb) (g/dL): 40.9   81/01/9145 Initial Diagnosis   CLL (chronic lymphocytic leukemia)   12/27/21 peripheral blood flowcytometry showed Involvement by CD5+, CD23+, CD20+, CD22+ clonal B cell population, phenotype typical for 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    01/05/2022 Imaging   CT chest abdomen pelvis wo contrast 1. Multiple prominent borderline enlarged and mildly enlarged lymph nodes, most evident in the low anatomic pelvis, as above, compatible with reported clinical history of CLL. 2. There also several small pulmonary nodules in the lungs measuring 5 mm or less in size. This is nonspecific, but statistically likely benign. No follow-up needed if patient is low-risk (and has no known or suspected primary neoplasm). Non-contrast chest CT can be considered in 12 months if patient is high-risk. This recommendation 3. Aortic atherosclerosis, in addition to left main and 2 vessel coronary artery disease. Please note that although the presence of coronary artery calcium  documents the presence of coronary artery disease, the severity of this disease and any potential stenosis cannot be assessed on this non-gated CT examination. Assessment for  potential risk factor modification, dietary therapy or pharmacologic therapy may be warranted, if clinically indicated. 4. There are calcifications of the aortic valve. Echocardiographic correlation for  evaluation of potential valvular dysfunction may be warranted if clinically indicated. 5. 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 6. Diverticulosis without evidence of acute diverticulitis at this time. 7. Mild cardiomegaly.   08/26/2022 Imaging   CT abdomen pelvis w contrast  1. Subcutaneous fat stranding within the left lower quadrant anterior abdominal wall extending into the anterior proximal left thigh, likely related to contusion given history of recent trauma. No fluid collection or hematoma. 2. No acute displaced fracture. 3. Retroperitoneal and pelvic lymphadenopathy as above, with waxing and waning appearance. 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 4. 1.9 cm hypodensity within the medial aspect of the spleen,nonspecific. Leukemic involvement of the spleen cannot be excluded. 5. Distal colonic diverticulosis without diverticulitis. 6.  Aortic Atherosclerosis      08/23/2022 Colonoscopy showed - Likely malignant partially obstructing tumor in the distal ascending colon. Biopsied. Tattooed. - Three 4 to 6 mm polyps in the transverse colon and in the ascending colon, removed with a cold snare. Resected and retrieved. - Five 3 to 7 mm polyps in the rectum, in the descending colon, in the transverse colon and in the ascending colon, removed with a hot snare. Resected and retrieved. -  Diverticulosis in the left colon. - Non- bleeding internal hemorrhoids.  Pathology showed 1. Ascending Colon Polyp, x2 cold snare - TUBULAR ADENOMA(S) (MULTIPLE FRAGMENTS) - NEGATIVE FOR HIGH-GRADE DYSPLASIA OR MALIGNANCY 2. Ascending Colon Polyp, hot snare - TUBULAR ADENOMA (MULTIPLE FRAGMENTS) - NEGATIVE FOR HIGH-GRADE DYSPLASIA OR MALIGNANCY 3. Ascending Colon Biopsy, mass cbx - TUBULAR ADENOMA (MULTIPLE FRAGMENTS) - SEE NOTE 4. Transverse Colon Polyp, hot snare; cold snare - TUBULAR ADENOMA (MULTIPLE FRAGMENTS) - NEGATIVE FOR HIGH-GRADE DYSPLASIA OR MALIGNANCY 5. Descending  Colon Polyp, x2 hot snare - TUBULAR ADENOMA(S) (MULTIPLE FRAGMENTS) - NEGATIVE FOR HIGH-GRADE DYSPLASIA OR MALIGNANCY 6. Rectum, polyp(s), hot snare - TUBULOVILLOUS ADENOMA (MULTIPLE FRAGMENTS) - NEGATIVE FOR HIGH-GRADE DYSPLASIA OR MALIGNANCY I&D Recent admission due to sepsis due to cellulitis, left groin abscess, s/p I&D  04/24/2023 s/p colonoscopy, removal of small polyps.  04/26/2023 CT chest wo contrast  Pulmonary infiltrates as described and left pleural effusion correlate with bronchopneumonia. Follow-up recommended as clinically needed. Small pericardial effusion, correlation with echocardiography recommended as clinically needed.  04/27/2023 CT chest PE angiogram  No definite evidence of pulmonary embolus. Small left pleural effusion is noted. Airspace opacities are noted in lingular segment of left upper lobe as well as in left lower lobe most consistent with pneumonia. Minimal pericardial effusion. Aortic Atherosclerosis   INTERVAL HISTORY Paul Hinton [name] is a 78 y.o. male who has above history reviewed by me today presents for follow up visit for CLL, iron  deficiency anemia, colon tubular adenoma with high-grade dysplastic status post resection. He feels well today.  He continues to have shortness of breath with exertion and was evaluated by pulmonology, notes are not available in epic's Care Everywhere at the time of dictation. He was also recommended to get a PET scan myocardial sarcoidosis evaluation.  Patient denies fever or chills.  He takes Lasix  spironolactone  for pleural effusion.  MEDICAL HISTORY:  Past Medical History:  Diagnosis Date   Adenoma of left adrenal gland    Anemia    Aortic atherosclerosis (HCC)    Blood transfusion without reported diagnosis    BPH (benign prostatic hyperplasia)    Bradycardia    CAD (coronary artery disease) 08/26/2022   a.) cCTA 08/26/2022: Ca2+ 14.3 (12th %'ile for age/sex.race match control); (<25%) pLAD    Cellulitis of left thigh 08/2022   Chronic pain syndrome    a.) on COT managed by pain management   Chronic, continuous use of opioids    a.) chronic pain syndrome/chronic back pain; managed by pain management   CKD (chronic kidney disease) stage 3, GFR 30-59 ml/min (HCC)    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   a.) Rai stage III   DDD (degenerative disc disease), cervical    Difficult airway 10/22/2015   a.) 1st attempt with glidescope  --> macroglossic (unsuccessful); b.) 2nd attempt with Mac 4 and direct laryngoscopy with a 8.0 ETT --> tube too large to pass through the cords; b.) 3rd attempt (successful) with a Mac 4 and a 7.5 tube   Diverticulosis    DM (diabetes mellitus), type 2 (HCC)    Dysplasia of prostate    Erectile dysfunction    Frequent falls    Heart failure with mildly reduced ejection fraction (HFmrEF) (HCC)    a.) TTE 10/23/2015: EF 60-65%, mod-sev LVH, mild biatrial dil, degen MV disease, AoV sclerosis, asc Ao 38 mm, G1DD; b.) TTE 08/05/2022: EF 45-50%, mod-sev LVH (speckled pattern), sev biatrial dil, mild MR, AoV sclerosis, G1DD   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   a.) colonoscopy 08/23/2022: 14 x 15 mm partially obstructing ulcerated distal ascending colon mass; tissue friable --> pathology resulted as tubular adenoma, however felt to be false (-) --> referral to sugery and colectomy recommended; b.) s/p RIGHT hemicolectomy 09/28/2022   History of MRSA infection 08/28/2022   a.) MRSA PCR (+) 08/28/2022; culture from LEFT thigh abscess   HTN (hypertension)    Hyperlipidemia    Hypogonadism in male    Hypothyroidism    IDA (iron  deficiency anemia)    LBBB (left bundle branch block)    Long-term use of aspirin  therapy    Lumbar spinal stenosis    Mild cardiomegaly    Multiple lung nodules on CT    NICM (nonischemic cardiomyopathy) (HCC)    a.) TTE 10/23/2015: EF 60-65; b.) TTE 08/05/2022: EF 45-50%   Orchitis and epididymitis 06/16/2013   OSA on  CPAP    Palindromic rheumatism, hand    Pericardial effusion    Prostatitis    Sepsis (HCC) 08/2022   Subdural hematoma (HCC) 10/22/2015   a.) s/p traumatic mechanical fall --> CT head 10/22/2015: high-density SDH the left cerebral convexity with13 mm midline shift --> s/p LEFT frontal burr hole craniotomy   Tubular adenoma of colon    Ventral hernia     SURGICAL HISTORY: Past Surgical History:  Procedure Laterality Date   BACK SURGERY     BIOPSY  08/21/2022   Procedure: BIOPSY;  Surgeon: Quintin Buckle, DO;  Location: [facility] ENDOSCOPY;  Service: Gastroenterology;;   BIOPSY  08/23/2022   Procedure: BIOPSY;  Surgeon: Quintin Buckle, DO;  Location: [facility] ENDOSCOPY;  Service: Gastroenterology;;   BIOPSY  02/28/2023   Procedure: BIOPSY;  Surgeon: Quintin Buckle, DO;  Location: [facility] ENDOSCOPY;  Service: Gastroenterology;;   Kasandra Pain OF CRANIUM Left 10/22/2015   COLON SURGERY  09/28/2022   COLONOSCOPY WITH PROPOFOL  N/A 08/23/2022   Procedure: COLONOSCOPY WITH PROPOFOL ;  Surgeon: Quintin Buckle, DO;  Location: [facility] ENDOSCOPY;  Service: Gastroenterology;  Laterality: N/A;   COLONOSCOPY WITH PROPOFOL  N/A 02/28/2023   Procedure: COLONOSCOPY WITH PROPOFOL ;  Surgeon: Quintin Buckle, DO;  Location: [facility] ENDOSCOPY;  Service: Gastroenterology;  Laterality: N/A;  DM   COLONOSCOPY WITH PROPOFOL  N/A 04/25/2023   Procedure: COLONOSCOPY WITH PROPOFOL ;  Surgeon: Quintin Buckle, DO;  Location: [facility] ENDOSCOPY;  Service: Gastroenterology;  Laterality: N/A;  DM   ESOPHAGOGASTRODUODENOSCOPY (EGD) WITH PROPOFOL  N/A 08/21/2022   Procedure: ESOPHAGOGASTRODUODENOSCOPY (EGD) WITH PROPOFOL ;  Surgeon: Quintin Buckle, DO;  Location: [facility] ENDOSCOPY;  Service: Gastroenterology;  Laterality: N/A;   ESOPHAGOGASTRODUODENOSCOPY (EGD) WITH PROPOFOL  N/A 02/28/2023   Procedure: ESOPHAGOGASTRODUODENOSCOPY (EGD) WITH PROPOFOL ;  Surgeon: Quintin Buckle, DO;  Location: [facility]  ENDOSCOPY;  Service: Gastroenterology;  Laterality: N/A;   GIVENS CAPSULE STUDY N/A 08/23/2022   Procedure: GIVENS CAPSULE STUDY;  Surgeon: Quintin Buckle, DO;  Location: [facility] ENDOSCOPY;  Service: Gastroenterology;  Laterality: N/A;   HERNIA REPAIR     INGUINAL HERNIA REPAIR Right 03/09/2023   Procedure: HERNIA REPAIR INGUINAL ADULT, open, RNFA to assist;  Surgeon: Alben Alma, MD;  Location: [facility] ORS;  Service: General;  Laterality: Right;   IRRIGATION AND DEBRIDEMENT ABSCESS Left 08/28/2022   Procedure: IRRIGATION AND DEBRIDEMENT ABSCESS LEFT UPPER THIGH/GROIN;  Surgeon: Alben Alma, MD;  Location: [facility] ORS;  Service: General;  Laterality: Left;   LAPAROSCOPIC RIGHT COLECTOMY Right 09/28/2022   Procedure: LAPAROSCOPIC RIGHT COLECTOMY, RNFA to assist;  Surgeon: Alben Alma, MD;  Location: [facility] ORS;  Service: General;  Laterality: Right;   POLYPECTOMY  08/23/2022   Procedure: POLYPECTOMY;  Surgeon: Quintin Buckle, DO;  Location: [facility] ENDOSCOPY;  Service: Gastroenterology;;   POLYPECTOMY  04/25/2023   Procedure: POLYPECTOMY;  Surgeon: Quintin Buckle, DO;  Location: [facility] ENDOSCOPY;  Service: Gastroenterology;;   SUBMUCOSAL TATTOO INJECTION  08/23/2022   Procedure: SUBMUCOSAL TATTOO INJECTION;  Surgeon: Quintin Buckle, DO;  Location: [facility] ENDOSCOPY;  Service: Gastroenterology;;   TONSILLECTOMY     VENTRAL HERNIA REPAIR N/A 09/28/2022   Procedure: HERNIA REPAIR VENTRAL ADULT;  Surgeon: Alben Alma, MD;  Location: [facility] ORS;  Service: General;  Laterality: N/A;    SOCIAL HISTORY: Social History   Socioeconomic History   Marital status: Married    Spouse name: Not on file   Number of children: Not on file   Years of education: Not on file   Highest education level: 5th grade  Occupational History   Not on file  Tobacco Use   Smoking status: Former    Types: Cigarettes    Passive exposure: Past   Smokeless tobacco: Never   Tobacco comments:    Stop  smoking iver 40  plus years ago  Vaping Use   Vaping status: Never Used  Substance and Sexual Activity   Alcohol use: No   Drug use: No   Sexual activity: Not Currently  Other Topics Concern   Not on file  Social History Narrative   Not on file   Social Drivers of Health   Financial Resource Strain: Patient Declined (04/26/2023)   Received from [facility]   Overall Financial Resource Strain (CARDIA)    Difficulty of Paying Living Expenses: Patient declined  Recent Concern: Financial Resource Strain - Medium Risk (02/24/2023)   Received from [facility]   Overall Financial Resource Strain (CARDIA)    Difficulty of Paying Living Expenses: Somewhat hard  Food Insecurity: Patient Declined (04/26/2023)   Received from [facility]   Hunger Vital Sign    Worried About Running Out of Food in the Last Year: Patient declined    Ran Out of Food in the Last Year: Patient declined  Recent Concern: Food Insecurity - Food Insecurity Present (02/24/2023)   Received from [facility]   Hunger Vital Sign    Worried About Running Out of Food in the Last Year: Sometimes true    Ran Out of Food in the Last Year: Sometimes true  Transportation Needs: No Transportation Needs (04/26/2023)   Received from [facility] - Transportation    In the past 12 months, has lack of transportation kept you from medical appointments or from getting medications?: No    Lack of Transportation (Non-Medical): No  Physical Activity: Unknown (07/26/2022)   Exercise Vital Sign    Days of Exercise per Week: Patient declined    Minutes of Exercise per Session: Not on file  Stress: Stress Concern Present (07/26/2022)   [employer] of Occupational Health - Occupational Stress Questionnaire    Feeling of Stress : To some extent  Social Connections: Socially Integrated (12/20/2022)   Social Connection and Isolation Panel [NHANES]     Frequency of Communication with Friends and Family: More than three times a week    Frequency of Social Gatherings with Friends and Family: Patient declined    Attends Religious Services: More than 4 times per year    Active Member of [employer]  or Organizations: Yes    Attends Banker Meetings: Never    Marital Status: Married  Catering manager Violence: Not At Risk (09/28/2022)   Humiliation, Afraid, Rape, and Kick questionnaire    Fear of Current or Ex-Partner: No    Emotionally Abused: No    Physically Abused: No    Sexually Abused: No    FAMILY HISTORY: Family History  Problem Relation Age of Onset   Cancer Maternal Aunt    ADD / ADHD Daughter    Asthma Son    Prostate cancer Neg Hx    Kidney disease Neg Hx    Kidney cancer Neg Hx    Bladder Cancer Neg Hx     ALLERGIES:  has no known allergies.  MEDICATIONS:  Current Outpatient Medications  Medication Sig Dispense Refill   aspirin  EC 81 MG tablet Take 1 tablet (81 mg total) by mouth daily. Swallow whole.     empagliflozin (JARDIANCE) 10 MG TABS tablet Take 1 tablet (10 mg total) by mouth daily before breakfast. 90 tablet 2   hydrALAZINE  (APRESOLINE ) 50 MG tablet Take 2 tablets (100 mg total) by mouth 2 (two) times daily. 180 tablet 3   levothyroxine  (SYNTHROID ) 200 MCG tablet Take 1 tablet (200 mcg total) by mouth daily before breakfast. 90 tablet 0   methocarbamol  (ROBAXIN -750) 750 MG tablet Take 1 tablet (750 mg total) by mouth every 8 (eight) hours as needed for muscle spasms. 30 tablet 0   metoprolol  succinate (TOPROL  XL) 25 MG 24 hr tablet Take 1 tablet (25 mg total) by mouth daily. 90 tablet 1   rosuvastatin  (CRESTOR ) 40 MG tablet Take 1 tablet (40 mg total) by mouth daily. 90 tablet 1   sacubitril -valsartan  (ENTRESTO ) 97-103 MG Take 1 tablet by mouth 2 (two) times daily. 120 tablet 3   spironolactone  (ALDACTONE ) 25 MG tablet Take 0.5 tablets (12.5 mg total) by mouth daily. 45 tablet 3   furosemide  (LASIX )  20 MG tablet Take 1 tablet (20 mg total) by mouth daily as needed for fluid or edema. 30 tablet 6   metFORMIN  (GLUCOPHAGE ) 500 MG tablet Take 2 tablets (1,000 mg total) by mouth 2 (two) times daily with a meal. (Patient not taking: Reported on 05/05/2023) 120 tablet 0   oxyCODONE -acetaminophen  (PERCOCET) 10-325 MG tablet Take 1 tablet by mouth every 8 (eight) hours as needed for pain. Must last 30 days. (Patient not taking: Reported on 05/25/2023) 90 tablet 0   zanubrutinib  (BRUKINSA ) 80 MG capsule Take 2 capsules (160 mg total) by mouth 2 (two) times daily. (Patient not taking: Reported on 05/25/2023) 120 capsule 2   No current facility-administered medications for this visit.    Review of Systems  Constitutional:  Positive for fatigue. Negative for appetite change, chills, fever and unexpected weight change.  HENT:   Negative for hearing loss and voice change.   Eyes:  Negative for eye problems and icterus.  Respiratory:  Negative for chest tightness, cough and shortness of breath.   Cardiovascular:  Negative for chest pain and leg swelling.  Gastrointestinal:  Negative for abdominal distention and abdominal pain.  Endocrine: Negative for hot flashes.  Genitourinary:  Negative for difficulty urinating, dysuria and frequency.   Musculoskeletal:  Positive for arthralgias.  Skin:  Negative for itching and rash.  Neurological:  Negative for light-headedness and numbness.  Hematological:  Negative for adenopathy. Does not bruise/bleed easily.  Psychiatric/Behavioral:  Negative for confusion.      PHYSICAL EXAMINATION: ECOG PERFORMANCE STATUS: 1 -  Symptomatic but completely ambulatory  Vitals:   05/25/23 0952  BP: 138/63  Pulse: 63  Resp: 18  Temp: (!) 96 F (35.6 C)  SpO2: 99%   Filed Weights   05/25/23 0952  Weight: 222 lb 12.8 oz (101.1 kg)    Physical Exam Constitutional:      General: He is not in acute distress.    Appearance: He is obese. He is not diaphoretic.  HENT:      Head: Normocephalic and atraumatic.  Eyes:     General: No scleral icterus.    Pupils: Pupils are equal, round, and reactive to light.  Cardiovascular:     Rate and Rhythm: Normal rate.     Heart sounds: No murmur heard. Pulmonary:     Effort: Pulmonary effort is normal. No respiratory distress.     Breath sounds: No wheezing.  Abdominal:     General: There is no distension.     Palpations: Abdomen is soft.  Genitourinary:    Comments:   Musculoskeletal:     Cervical back: Normal range of motion and neck supple.  Skin:    General: Skin is warm and dry.     Findings: No erythema.  Neurological:     Mental Status: He is alert and oriented to person, place, and time. Mental status is at baseline.     Cranial Nerves: No cranial nerve deficit.     Motor: No abnormal muscle tone.  Psychiatric:        Mood and Affect: Mood and affect normal.     LABORATORY DATA:  I have reviewed the data as listed    Latest Ref Rng & Units 05/25/2023    9:25 AM 05/12/2023    9:28 AM 05/05/2023   12:56 PM  CBC  WBC 4.0 - 10.5 K/uL 5.9  6.4    Hemoglobin 13.0 - 17.0 g/dL 9.3  8.9  8.5   Hematocrit 39.0 - 52.0 % 29.6  29.3  28.6   Platelets 150 - 400 K/uL 198  194        Latest Ref Rng & Units 05/25/2023    9:25 AM 05/12/2023    9:28 AM 04/21/2023    1:04 PM  CMP  Glucose 70 - 99 mg/dL 161  096  045   BUN 8 - 23 mg/dL 16  15  13    Creatinine 0.61 - 1.24 mg/dL 4.09  8.11  9.14   Sodium 135 - 145 mmol/L 136  139  138   Potassium 3.5 - 5.1 mmol/L 4.1  3.9  3.4   Chloride 98 - 111 mmol/L 105  108  103   CO2 22 - 32 mmol/L 23  23  25    Calcium  8.9 - 10.3 mg/dL 8.6  8.5  8.3   Total Protein 6.5 - 8.1 g/dL  6.4  6.4   Total Bilirubin 0.0 - 1.2 mg/dL  1.2  0.5   Alkaline Phos 38 - 126 U/L  83  67   AST 15 - 41 U/L  54  46   ALT 0 - 44 U/L  27  25      RADIOGRAPHIC STUDIES: I have personally reviewed the radiological images as listed and agreed with the findings in the report. CT SHOULDER  RIGHT WO CONTRAST Result Date: 05/18/2023 CLINICAL DATA:  Right shoulder pain EXAM: CT OF THE UPPER RIGHT EXTREMITY WITHOUT CONTRAST TECHNIQUE: Multidetector CT imaging of the upper right extremity was performed according to the standard protocol. RADIATION  DOSE REDUCTION: This exam was performed according to the departmental dose-optimization program which includes automated exposure control, adjustment of the mA and/or kV according to patient size and/or use of iterative reconstruction technique. COMPARISON:  None Available. FINDINGS: Bones/Joint/Cartilage Subacute nondisplaced healing comminuted fracture of the acromion with callus formation. Normal alignment. Small joint effusion. Mild osteoarthritis of the glenohumeral joint. Severe narrowing of the acromiohumeral distance. Moderate arthropathy of the acromioclavicular joint. Incidental note made of an os acromiale. Ligaments Ligaments are suboptimally evaluated by CT. Muscles and Tendons Moderate-severe atrophy of the supraspinatus and infraspinatus muscles. Complete full-thickness, full width tear of the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

## 2023-05-25 NOTE — Telephone Encounter (Signed)
 Lab results today showed creatinine slightly up. Given patient appeared euvolemic, weight was down, and diuresis will be augmented by changes to Jardiance and Entersto, will adjust furosemide  to as needed. Patient made aware. Will reassess next week.

## 2023-05-25 NOTE — Assessment & Plan Note (Signed)
 Neutropenia precaution was reviewed and discussed with patient. Hold Zanubrutinib 

## 2023-05-25 NOTE — Assessment & Plan Note (Signed)
 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 Lab Results  Component Value Date   HGB 9.3 (L) 05/25/2023   TIBC 309 05/12/2023   IRONPCTSAT 14 (L) 05/12/2023   FERRITIN 81 05/12/2023   recurrent iron  deficiency anemia.   Recommend Venofer  weekly x 3 Colonoscopy findings were reviewed. No obvious source of bleeding.

## 2023-05-25 NOTE — Assessment & Plan Note (Signed)
 Labs are reviewed and discussed with patient. CT chest abdomen pelvis results reviewed and discussed with patient.   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 Due to neutropenia, continue to hold hold off Zanubrutinib  160 mg twice daily, .

## 2023-05-25 NOTE — Assessment & Plan Note (Signed)
 Encourage oral hydration and avoid nephrotoxins.  Cr is worse, likely due to recent contrast study, and use of diuretics.  Recommend patient to temporarily hold off Lasix 

## 2023-05-27 ENCOUNTER — Other Ambulatory Visit: Payer: Self-pay

## 2023-05-30 ENCOUNTER — Telehealth: Payer: Self-pay | Admitting: Pharmacist

## 2023-05-30 DIAGNOSIS — R0602 Shortness of breath: Secondary | ICD-10-CM | POA: Insufficient documentation

## 2023-05-30 DIAGNOSIS — J301 Allergic rhinitis due to pollen: Secondary | ICD-10-CM | POA: Diagnosis not present

## 2023-05-30 DIAGNOSIS — C9110 Chronic lymphocytic leukemia of B-cell type not having achieved remission: Secondary | ICD-10-CM | POA: Diagnosis not present

## 2023-05-30 DIAGNOSIS — R0609 Other forms of dyspnea: Secondary | ICD-10-CM | POA: Diagnosis not present

## 2023-05-30 DIAGNOSIS — J22 Unspecified acute lower respiratory infection: Secondary | ICD-10-CM | POA: Diagnosis not present

## 2023-05-30 DIAGNOSIS — R053 Chronic cough: Secondary | ICD-10-CM | POA: Diagnosis not present

## 2023-05-30 DIAGNOSIS — R9389 Abnormal findings on diagnostic imaging of other specified body structures: Secondary | ICD-10-CM | POA: Diagnosis not present

## 2023-05-30 NOTE — Telephone Encounter (Signed)
 Called to confirm/remind patient of their appointment at the [facility] on 05/31/23.   Appointment:   [] Confirmed  [x] Left mess   [] No answer/No voice mail  [] VM Full/unable to leave message  [] Phone not in service  Patient reminded to bring all medications and/or complete list.  Confirmed patient has transportation. Gave directions, instructed to utilize valet parking.

## 2023-05-31 ENCOUNTER — Encounter (HOSPITAL_COMMUNITY): Payer: Self-pay

## 2023-05-31 ENCOUNTER — Ambulatory Visit: Attending: Family | Admitting: Pharmacist

## 2023-05-31 ENCOUNTER — Telehealth (HOSPITAL_COMMUNITY): Payer: Self-pay | Admitting: *Deleted

## 2023-05-31 VITALS — BP 128/76 | HR 65 | Wt 222.8 lb

## 2023-05-31 DIAGNOSIS — E119 Type 2 diabetes mellitus without complications: Secondary | ICD-10-CM | POA: Insufficient documentation

## 2023-05-31 DIAGNOSIS — N183 Chronic kidney disease, stage 3 unspecified: Secondary | ICD-10-CM | POA: Diagnosis not present

## 2023-05-31 DIAGNOSIS — I2510 Atherosclerotic heart disease of native coronary artery without angina pectoris: Secondary | ICD-10-CM | POA: Insufficient documentation

## 2023-05-31 DIAGNOSIS — Z856 Personal history of leukemia: Secondary | ICD-10-CM | POA: Insufficient documentation

## 2023-05-31 DIAGNOSIS — Z9221 Personal history of antineoplastic chemotherapy: Secondary | ICD-10-CM | POA: Insufficient documentation

## 2023-05-31 DIAGNOSIS — I130 Hypertensive heart and chronic kidney disease with heart failure and stage 1 through stage 4 chronic kidney disease, or unspecified chronic kidney disease: Secondary | ICD-10-CM | POA: Insufficient documentation

## 2023-05-31 DIAGNOSIS — Z79899 Other long term (current) drug therapy: Secondary | ICD-10-CM | POA: Diagnosis not present

## 2023-05-31 DIAGNOSIS — E039 Hypothyroidism, unspecified: Secondary | ICD-10-CM | POA: Insufficient documentation

## 2023-05-31 DIAGNOSIS — Z7984 Long term (current) use of oral hypoglycemic drugs: Secondary | ICD-10-CM | POA: Diagnosis not present

## 2023-05-31 DIAGNOSIS — I447 Left bundle-branch block, unspecified: Secondary | ICD-10-CM | POA: Insufficient documentation

## 2023-05-31 DIAGNOSIS — I5022 Chronic systolic (congestive) heart failure: Secondary | ICD-10-CM | POA: Insufficient documentation

## 2023-05-31 DIAGNOSIS — I428 Other cardiomyopathies: Secondary | ICD-10-CM | POA: Insufficient documentation

## 2023-05-31 NOTE — Telephone Encounter (Signed)
 Attempted to call patient regarding upcoming cardiac PET appointment. Left message on voicemail with name and callback number  Larey Brick RN Navigator Cardiac Imaging [facility] [phone] Office [phone] Cell

## 2023-05-31 NOTE — Progress Notes (Signed)
 Advanced Heart Failure Clinic Note  PCP: Lenell Query, PA-C PCP-Cardiologist: Constancia Delton, MD HF-Cardiologist: Jules Oar, MD  HPI: Paul Foskett [name] is a 78 y.o. male with h/o HFmrEF, NICM, LVH, HTN, nonobstructive CAD, diabetes, CLL diagnosed 12/2021 s/p chemo, CKD stage 3, IDA who presents for medication titration.     The patient was initially seen 05/2022 by CHF team for HTN and he was started on Diovan  160mg  daily. EKG showed LBBB and an echo was ordered. Echo showed LVEF 45-50%, no WMA, moderate to severe LVH with speckled appearance, consider for amyloidosis, severely dilated left atrium, mildly dilated right atrium, small pericardial effusion with no tamponade, mild MR. Plan was for cardiac CTA and cardiac MRI.  Cardiac CTA showed coronary calcium  score of 14.3, 12th percentile for age and sex matched control, minimal proximal LAD stenosis, overall minimal nonobstructive CAD.   Patient was admitted August 26, 2022 with a mechanical fall fall, cellulitis of the left groin/SIRS, and mildly elevated troponin. He was briefly seen by cardiology and felt to be stable. No inpatient cardiac work-up was pursued.    The patient was seen 09/17/22 for pre-op for laparoscopic colectomy 09/28/22. He felt weak since being discharged from the hospital, but was overall stable from a cardiac perspective. He was encouraged to get the cardiac MRI done.    cMRI 03/2023 showed EF 43% Mod-sev basal septal hypertrophy (16mm) RVEF 48% No LGE Mild MR .Findings suggest non ischemic cardiomyopathy. Possible inflammatory disease involving the basal lateral wall.   Seen by Dr. Julane Ny 05/05/23. Reported he had ran out of most of his meds for 1-2 weeks. His medications were restarted. Labs collected ~2 weeks later were stable.  Seen by HF PharmD on 05/24/2023 for medication titration. Increased Entresto  to 97/103 mg BID and started Jardiance  10 mg daily.   Today Paul Grave [name]  returns to Heart Failure Clinic for pharmacist medication titration. Reports feeling fine, with baseline fatigue stable to mildly improved. Denies dizziness, lightheadedness, chest pain, palpitations, SOB, LEE, orthopnea, orthostasis, and PND. Reports being able to complete all activities of daily living (ADLs). Has enjoyed fishing frequently. Is active throughout the day. He reports not taking weights at home. Reports stopping furosemide  ~3 days ago after labs returned with elevated creatinine. Appetite is normal. Does not follow a low sodium diet.  Current Heart Failure Medications: Loop diuretic: none (stopped ~3 days prior) Beta-Blocker: metoprolol  succinate 25 mg daily  ACEI/ARB/ARNI: Entresto  97/103 mg BID  MRA: spironolactone  12.5 mg daily  SGLT2i: Jardiance  10 mg daily  Other: hydralazine  100 mg BID   Has the patient been experiencing any side effects to the medications prescribed? No  Does the patient have any problems obtaining medications due to transportation or finances? No  Understanding of regimen: Fair  Understanding of indications: Fair  Potential of adherence: Fair  Patient understands to avoid NSAIDs.  Patient understands to avoid decongestants.  Pertinent Lab Values: Creatinine  Date Value Ref Range Status  05/25/2023 1.61 (H) 0.61 - 1.24 mg/dL Final   Creat  Date Value Ref Range Status  07/06/2021 1.08 0.70 - 1.28 mg/dL Final   BUN  Date Value Ref Range Status  05/25/2023 16 8 - 23 mg/dL Final  16/10/9602 12 8 - 27 mg/dL Final  54/09/8117 27 (H) 7 - 18 mg/dL Final   Potassium  Date Value Ref Range Status  05/25/2023 4.1 3.5 - 5.1 mmol/L Final  10/06/2013 4.1 3.5 - 5.1 mmol/L Final   Sodium  Date Value Ref Range Status  05/25/2023 136 135 - 145 mmol/L Final  12/22/2022 146 (H) 134 - 144 mmol/L Final  10/06/2013 139 136 - 145 mmol/L Final   Magnesium  Date Value Ref Range Status  10/06/2022 2.1 1.7 - 2.4 mg/dL Final    Comment:    Performed at  [facility], [address] [place], [place] [address]   TSH  Date Value Ref Range Status  12/22/2022 27.300 (H) 0.450 - 4.500 uIU/mL Final    Vital Signs: Today's Vitals   05/31/23 1437  BP: 128/76  Pulse: 65  SpO2: 97%  Weight: 222 lb 12.8 oz (101.1 kg)    Assessment/Plan: 1. Chronic systolic HF due to NICM  - Echo 05/2022 LVEF 45-50%, no WMA, moderate to severe LVH, severely dilated left atrium, mildly dilated right atrium, small pericardial effusion with no tamponade, mild MR. - Cardiac CTA CAC 14.3, minimal proximal LAD stenosis, overall minimal nonobstructive CAD. - cMRI 03/2023 EF 43% Mod-sev basal septal hypertrophy (16mm) RVEF 48% No LGE Mild MR. Findings suggestive of non ischemic cardiomyopathy. Possible inflammatory disease involving the basal lateral wall. - Most likely etiology is HTN cardiomyopathy but cMRI also raises possibility of sarcoid (CLL treatment or LBBB etiology less likely). PET scan scheduled this week. - Euvolemic today, - NYHA II symptoms. Likely hypovolemic during labs on 05/25/23. Weight is down 1 lbs from last visit. Agree with discontinuation of furosemide , will update medication list. Check BMET in 1 week. - Continue Entresto  97/103 mg BID.  - Continue metoprolol  succinate 25 mg daily. HR <70 today. Will not titrate today given persistent fatigue. - Continue spironolactone  12.5 mg daily. Will increase to target dose when creatinine is trending back to baseline off furosemide . - Continue Jardiance  10 mg daily.  - Continue hydralazine  100 mg BID. - Cardiac PET to assess for sarcoid scheduled for 06/02/2023     2. HTN, severe - BP much better controlled today - Meds as above    3. CLL - Oncology following  4. Hypothyroidism - Last TSH in 11/2022 was 27. Will check TSH/T4 in 1 week.  Follow up: 1 week for labs.   Please do not hesitate to reach out with questions or concerns,  Bevely Brush, PharmD, CPP, BCPS Heart Failure Pharmacist   Phone - [phone] 05/31/2023 3:24 PM

## 2023-05-31 NOTE — Patient Instructions (Signed)
 It was a pleasure seeing you today!  MEDICATIONS: -No changes today -Call if you have questions about your medications.  LABS: -We will call you if your labs need attention.  NEXT APPOINTMENT: Return in 1 week for labs.  In general, to take care of your heart failure: -Limit your fluid intake to 2 Liters (half-gallon) per day.   -Limit your salt intake to ideally 2-3 grams (2000-3000 mg) per day. -Weigh yourself daily and record, and bring that "weight diary" to your next appointment.  (Weight gain of 2-3 pounds in 1 day typically means fluid weight.) -The medications for your heart are to help your heart and help you live longer.   -Please contact us  before stopping any of your heart medications.  Call the clinic at [phone] with questions or to reschedule future appointments.

## 2023-05-31 NOTE — Progress Notes (Deleted)
 Advanced Heart Failure Clinic Note  PCP: Lenell Query, PA-C PCP-Cardiologist: Constancia Delton, MD HF-Cardiologist: Jules Oar, MD  HPI: Paul Omar [name] is a 78 y.o. male with h/o HFmrEF, NICM, LVH, HTN, nonobstructive CAD, diabetes, CLL diagnosed 12/2021 s/p chemo, CKD stage 3, IDA who presents for medication titration.     The patient was initially seen 05/2022 by CHF team for HTN and he was started on Diovan  160mg  daily. EKG showed LBBB and an echo was ordered. Echo showed LVEF 45-50%, no WMA, moderate to severe LVH with speckled appearance, consider for amyloidosis, severely dilated left atrium, mildly dilated right atrium, small pericardial effusion with no tamponade, mild MR. Plan was for cardiac CTA and cardiac MRI.  Cardiac CTA showed coronary calcium  score of 14.3, 12th percentile for age and sex matched control, minimal proximal LAD stenosis, overall minimal nonobstructive CAD.   Patient was admitted August 26, 2022 with a mechanical fall fall, cellulitis of the left groin/SIRS, and mildly elevated troponin. He was briefly seen by cardiology and felt to be stable. No inpatient cardiac work-up was pursued.    The patient was seen 09/17/22 for pre-op for laparoscopic colectomy 09/28/22. He felt weak since being discharged from the hospital, but was overall stable from a cardiac perspective. He was encouraged to get the cardiac MRI done.    cMRI 03/2023 showed EF 43% Mod-sev basal septal hypertrophy (16mm) RVEF 48% No LGE Mild MR .Findings suggest non ischemic cardiomyopathy. Possible inflammatory disease involving the basal lateral wall.   Seen by Dr. Julane Ny 05/05/23. Reported he had ran out of most of his meds for 1-2 weeks. His medications were restarted. Labs collected ~2 weeks later were stable.  Seen by HF PharmD on 05/24/2023 for medication titration. Increased Entresto  to 97/103 mg BID and started Jardiance  10 mg daily.   Today Paul Grave [name]  returns to Heart Failure Clinic for pharmacist medication titration. Reports feeling fatigue. Denies dizziness lightheadedness chest pain palpitations SOB LEE orthopnea orthostasis PND. Reports being able to complete all activities of daily living (ADLs). Is mildly active throughout the day. He reports not taking weights at home.Takes no diuretic. Patient reports furosemide  was stopped within the past week. Appetite normal. Does not follow a low sodium diet.  Current Heart Failure Medications: Loop diuretic: furosemide  20 mg daily  Beta-Blocker: metoprolol  succinate 25 mg daily  ACEI/ARB/ARNI: Entresto  97/103 mg BID  MRA: spironolactone  12.5 mg daily  SGLT2i: Jardiance  10 mg daily  Other: hydralazine  100 mg BID   Has the patient been experiencing any side effects to the medications prescribed? No  Does the patient have any problems obtaining medications due to transportation or finances? No  Understanding of regimen: Fair  Understanding of indications: Fair  Potential of adherence: Fair  Patient understands to avoid NSAIDs.  Patient understands to avoid decongestants.  Pertinent Lab Values: Creatinine  Date Value Ref Range Status  05/25/2023 1.61 (H) 0.61 - 1.24 mg/dL Final   Creat  Date Value Ref Range Status  07/06/2021 1.08 0.70 - 1.28 mg/dL Final   BUN  Date Value Ref Range Status  05/25/2023 16 8 - 23 mg/dL Final  16/10/9602 12 8 - 27 mg/dL Final  54/09/8117 27 (H) 7 - 18 mg/dL Final   Potassium  Date Value Ref Range Status  05/25/2023 4.1 3.5 - 5.1 mmol/L Final  10/06/2013 4.1 3.5 - 5.1 mmol/L Final   Sodium  Date Value Ref Range Status  05/25/2023 136 135 - 145 mmol/L Final  12/22/2022 146 (H) 134 - 144 mmol/L Final  10/06/2013 139 136 - 145 mmol/L Final   Magnesium  Date Value Ref Range Status  10/06/2022 2.1 1.7 - 2.4 mg/dL Final    Comment:    Performed at [facility], [address] [place], [place] [address]   TSH  Date Value Ref Range  Status  12/22/2022 27.300 (H) 0.450 - 4.500 uIU/mL Final    Vital Signs: There were no vitals filed for this visit.  Assessment/Plan: 1. Chronic systolic HF due to NICM  - Echo 05/2022 LVEF 45-50%, no WMA, moderate to severe LVH, severely dilated left atrium, mildly dilated right atrium, small pericardial effusion with no tamponade, mild MR. - Cardiac CTA CAC 14.3,minimal proximal LAD stenosis, overall minimal nonobstructive CAD. - cMRI 03/2023 EF 43% Mod-sev basal septal hypertrophy (16mm) RVEF 48% No LGE Mild MR. Findings suggest non ischemic cardiomyopathy. Possible inflammatory disease involving the basal lateral wall. - most likely etiology is HTN cardiomyopathy but cMRI also raises possibility of sarcoid (CLL treatment or LBBB etiology less likely). PET scan scheduled this month. - Volume OK . Weight is down 1 lbs from last visit. Agree with discontinuation of furosemide  given increase in creatinine on most recent labs.  - NYHA II - Continue Entresto  97/103 mg BID.  - Continue metoprolol  succinate 25 mg daily. HR <70 today. - Continue spironolactone  12.5 mg daily. - Continue Jardiance  10 mg daily.  - Continue hydralazine  100 mg BID. - Cardiac PET to assess for sarcoid scheduled for 06/02/2023  Refer to HF PharmD for help with med access, titration and compliance    2. HTN, severe - meds as above - consider sleep study   3. CLL - Oncology following   4. Acid Reflux - Provided nonpharmacologic advice on how to avoid acid reflux.  - Use Tums prn  Follow up: 1 week for labs   Galvin Jules, Student Pharmacist

## 2023-06-01 ENCOUNTER — Telehealth (HOSPITAL_COMMUNITY): Payer: Self-pay

## 2023-06-01 ENCOUNTER — Other Ambulatory Visit: Payer: Self-pay

## 2023-06-01 ENCOUNTER — Inpatient Hospital Stay

## 2023-06-01 ENCOUNTER — Inpatient Hospital Stay: Attending: Oncology

## 2023-06-01 ENCOUNTER — Ambulatory Visit: Payer: No Typology Code available for payment source | Admitting: Medical

## 2023-06-01 VITALS — BP 126/54 | HR 50 | Temp 97.3°F | Resp 17

## 2023-06-01 DIAGNOSIS — C9110 Chronic lymphocytic leukemia of B-cell type not having achieved remission: Secondary | ICD-10-CM

## 2023-06-01 DIAGNOSIS — D509 Iron deficiency anemia, unspecified: Secondary | ICD-10-CM | POA: Insufficient documentation

## 2023-06-01 LAB — CBC WITH DIFFERENTIAL (CANCER CENTER ONLY)
Abs Immature Granulocytes: 0.01 10*3/uL (ref 0.00–0.07)
Basophils Absolute: 0.1 10*3/uL (ref 0.0–0.1)
Basophils Relative: 1 %
Eosinophils Absolute: 0.1 10*3/uL (ref 0.0–0.5)
Eosinophils Relative: 1 %
HCT: 30.1 % — ABNORMAL LOW (ref 39.0–52.0)
Hemoglobin: 9.4 g/dL — ABNORMAL LOW (ref 13.0–17.0)
Immature Granulocytes: 0 %
Lymphocytes Relative: 55 %
Lymphs Abs: 3.6 10*3/uL (ref 0.7–4.0)
MCH: 27.9 pg (ref 26.0–34.0)
MCHC: 31.2 g/dL (ref 30.0–36.0)
MCV: 89.3 fL (ref 80.0–100.0)
Monocytes Absolute: 0.9 10*3/uL (ref 0.1–1.0)
Monocytes Relative: 14 %
Neutro Abs: 1.9 10*3/uL (ref 1.7–7.7)
Neutrophils Relative %: 29 %
Platelet Count: 198 10*3/uL (ref 150–400)
RBC: 3.37 MIL/uL — ABNORMAL LOW (ref 4.22–5.81)
RDW: 21.7 % — ABNORMAL HIGH (ref 11.5–15.5)
Smear Review: NORMAL
WBC Count: 6.5 10*3/uL (ref 4.0–10.5)
WBC Morphology: ABNORMAL
nRBC: 0 % (ref 0.0–0.2)

## 2023-06-01 LAB — SAMPLE TO BLOOD BANK

## 2023-06-01 MED ORDER — IRON SUCROSE 20 MG/ML IV SOLN
200.0000 mg | Freq: Once | INTRAVENOUS | Status: AC
  Administered 2023-06-01: 200 mg via INTRAVENOUS
  Filled 2023-06-01: qty 10

## 2023-06-01 MED ORDER — SODIUM CHLORIDE 0.9% FLUSH
10.0000 mL | Freq: Once | INTRAVENOUS | Status: AC | PRN
  Administered 2023-06-01: 10 mL
  Filled 2023-06-01: qty 10

## 2023-06-01 NOTE — Progress Notes (Signed)
 Patient tolerated Venofer  infusion well. Asymptomatic. Explained recommendation of 30 min post monitoring. Patient refused to wait post monitoring. Educated on what signs to watch for & to call with any concerns. No questions, discharged. Stable

## 2023-06-01 NOTE — Progress Notes (Deleted)
  Cardiology Office Note:  .   Date:  06/01/2023  ID:  Paul Grave [name], DOB [birth year], MRN [id number] PCP: Lenell Query, PA-C  [facility] Providers Cardiologist:  Constancia Delton, MD { Click to update primary MD,subspecialty MD or APP then REFRESH:1}   History of Present Illness: .   Paul Grave [name] is a 78 y.o. male with h/o HFmrEF, NICM, LVH, HTN, nonobstructive CAD, diabetes, CLL s/p chemo, CKD stage 3, IDA who presents for pre-op evaluation.    The patient was initially seen 05/2022 for HTN and he was started on Diovan  160mg  daily. EKG showed LBBB and an echo was ordered. Echo showed LVEF 45-50%, no WMA, moderate to severe LVH with speckled appearance, consider for amyloidosis, severely dilated left atrium, mildly dilated right atrium, small pericardial effusion with no tamponade, mild MR. Plan was for cardiac CTA and cardiac MRI.  Cardiac CTA showed coronary calcium  score of 14.3, 12th percentile for age and sex matched control, minimal proximal LAD stenosis, overall minimal nonobstructive CAD.   Patient was admitted August 26, 2022 with a mechanical fall fall, cellulitis of the left groin/SIRS, and mildly elevated troponin. He was briefly seen by cardiology and felt to be stable. No inpatient cardiac work-up was pursued.    The patient was last seen 09/17/22 for pre-op for laparoscopic colectomy 09/28/22. He felt weak since being discharged from the hospital, but was overall stable from a cardiac perspective. He was encouraged to get the cardiac MRI done.  Cardiac MRI 325 showed EF of 43%, moderate to severe basal septal hypertrophy 16 mm, RVEF 48%, no LGE, mild MR.  Findings suggestive of nonischemic cardiomyopathy.  Possible inflammatory disease involving basal lateral wall.  Patient was last seen by advanced heart failure team  05/05/23.  He was out of all his medications and he was started on Entresto .  PET was ordered for sarcoid.  Today,     ROS:  ***  Studies Reviewed: .        *** Risk Assessment/Calculations:   {Does this patient have ATRIAL FIBRILLATION?:[phone]}         Physical Exam:   VS:  There were no vitals taken for this visit.   Wt Readings from Last 3 Encounters:  05/31/23 222 lb 12.8 oz (101.1 kg)  05/25/23 222 lb 12.8 oz (101.1 kg)  05/24/23 223 lb 3.2 oz (101.2 kg)    GEN: Well nourished, well developed in no acute distress NECK: No JVD; No carotid bruits CARDIAC: ***RRR, no murmurs, rubs, gallops RESPIRATORY:  Clear to auscultation without rales, wheezing or rhonchi  ABDOMEN: Soft, non-tender, non-distended EXTREMITIES:  No edema; No deformity   ASSESSMENT AND PLAN: .   ***    {Are you ordering a CV Procedure (e.g. stress test, cath, DCCV, TEE, etc)?   Press F2        :[id number]}  Dispo: ***  Signed, Janay Canan Rebekah Canada, PA-C

## 2023-06-01 NOTE — Telephone Encounter (Signed)
 Cardiac PET GNFA([id number]) approved: [id number] valid until 07/03/23

## 2023-06-02 ENCOUNTER — Encounter (HOSPITAL_COMMUNITY)
Admission: RE | Admit: 2023-06-02 | Discharge: 2023-06-02 | Disposition: A | Discharge status: Home / Self Care | Source: Ambulatory Visit | Attending: Internal Medicine | Admitting: Internal Medicine

## 2023-06-02 DIAGNOSIS — I428 Other cardiomyopathies: Secondary | ICD-10-CM | POA: Insufficient documentation

## 2023-06-02 DIAGNOSIS — I517 Cardiomegaly: Secondary | ICD-10-CM | POA: Insufficient documentation

## 2023-06-02 DIAGNOSIS — I5022 Chronic systolic (congestive) heart failure: Secondary | ICD-10-CM | POA: Insufficient documentation

## 2023-06-08 ENCOUNTER — Inpatient Hospital Stay

## 2023-06-08 VITALS — BP 139/71 | HR 59 | Temp 97.9°F | Resp 18

## 2023-06-08 DIAGNOSIS — C9110 Chronic lymphocytic leukemia of B-cell type not having achieved remission: Secondary | ICD-10-CM

## 2023-06-08 DIAGNOSIS — D509 Iron deficiency anemia, unspecified: Secondary | ICD-10-CM | POA: Diagnosis not present

## 2023-06-08 MED ORDER — IRON SUCROSE 20 MG/ML IV SOLN
200.0000 mg | Freq: Once | INTRAVENOUS | Status: AC
Start: 2023-06-08 — End: 2023-06-08
  Administered 2023-06-08: 200 mg via INTRAVENOUS

## 2023-06-08 NOTE — Progress Notes (Unsigned)
 PROVIDER NOTE: Interpretation of information contained herein should be left to medically-trained personnel. Specific patient instructions are provided elsewhere under "Patient Instructions" section of medical record. This document was created in part using AI and STT-dictation technology, any transcriptional errors that may result from this process are unintentional.  Patient: Paul Grave [name]  Service: E/M   PCP: Paul Query, PA-C  DOB: [birth year]  DOS: 06/09/2023  Provider: Cherylin Corrigan, NP  MRN: [id number]  Delivery: Face-to-face  Specialty: Interventional Pain Management  Type: Established Patient  Setting: Ambulatory outpatient facility  Specialty designation: 09  Referring Prov.: Paul Briggs,*  Location: Outpatient office facility       HPI  [name], a 78 y.o. year old male, is here today because of his No primary diagnosis found.. Paul Briggs primary complain today is No chief complaint on file.   Pain Assessment: Severity of   is reported as a  /10. Location:    / . Onset:  . Quality:  . Timing:  . Modifying factor(s):  Paul Briggs Vitals:  vitals were not taken for this visit.  BMI: Estimated body mass index is 29.39 kg/m as calculated from the following:   Height as of 04/25/23: 6\' 1"  (1.854 m).   Weight as of 05/31/23: 222 lb 12.8 oz (101.1 kg). Last encounter: 03/17/2023.  Reason for encounter: medication management. No change in medical history since last visit.  Patient's pain is at baseline.  Patient continues multimodal pain regimen as prescribed.  States that it provides pain relief and improvement in functional status.  Pharmacotherapy Assessment  Analgesic: {There is no content from the last Subjective section.}   Monitoring: [place] PMP: PDMP reviewed during this encounter.       Pharmacotherapy: No side-effects or adverse reactions reported. Compliance: No problems identified. Effectiveness: Clinically acceptable.  No notes on  file  No results found for: "CBDTHCR" No results found for: "D8THCCBX" No results found for: "D9THCCBX"  UDS:  Summary  Date Value Ref Range Status  12/21/2022 FINAL  Final    Comment:    ==================================================================== [facility] (MW) ==================================================================== Test                             Result       Flag       Units  Drug Present and Declared for Prescription Verification   Oxycodone                       555          EXPECTED   ng/mg creat   Oxymorphone                    308          EXPECTED   ng/mg creat   Noroxycodone                   1967         EXPECTED   ng/mg creat   Noroxymorphone                 113          EXPECTED   ng/mg creat    Sources of oxycodone  are scheduled prescription medications.    Oxymorphone, noroxycodone, and noroxymorphone are expected    metabolites of oxycodone . Oxymorphone is also available as a    scheduled prescription medication.  ====================================================================  Test                      Result    Flag   Units      Ref Range   Creatinine              119              mg/dL      >=95 ==================================================================== Declared Medications:  The flagging and interpretation on this report are based on the  following declared medications.  Unexpected results may arise from  inaccuracies in the declared medications.   **Note: The testing scope of this panel includes these medications:   Oxycodone  (Percocet)   **Note: The testing scope of this panel does not include the  following reported medications:   Acetaminophen  (Percocet)  Aspirin   Dapagliflozin  (Farxiga )  Doxycycline  Hydralazine  (Apresoline )  Levothyroxine  (Synthroid )  Metformin  (Glucophage )  Methocarbamol  (Robaxin )  Metoprolol  (Toprol )  Ondansetron  (Zofran )  Potassium (Klor-Con )  Pregabalin  (Lyrica )   Rosuvastatin  (Crestor )  Sacubitril  (Entresto )  Sildenafil  (Viagra )  Tamsulosin  (Flomax )  Valsartan  (Entresto )  Vitamin B12  Zanubrutinib  (Brukinsa ) ==================================================================== For clinical consultation, please call [phone]. ====================================================================       ROS  Constitutional: Denies any fever or chills Gastrointestinal: No reported hemesis, hematochezia, vomiting, or acute GI distress Musculoskeletal: Denies any acute onset joint swelling, redness, loss of ROM, or weakness Neurological: No reported episodes of acute onset apraxia, aphasia, dysarthria, agnosia, amnesia, paralysis, loss of coordination, or loss of consciousness  Medication Review  aspirin  EC, empagliflozin , hydrALAZINE , levothyroxine , metFORMIN , methocarbamol , metoprolol  succinate, oxyCODONE -acetaminophen , rosuvastatin , sacubitril -valsartan , spironolactone , and zanubrutinib   History Review  Allergy: Paul Briggs has no known allergies. Drug: Paul Briggs  reports no history of drug use. Alcohol:  reports no history of alcohol use. Tobacco:  reports that he has quit smoking. His smoking use included cigarettes. He has been exposed to tobacco smoke. He has never used smokeless tobacco. Social: Paul Briggs  reports that he has quit smoking. His smoking use included cigarettes. He has been exposed to tobacco smoke. He has never used smokeless tobacco. He reports that he does not drink alcohol and does not use drugs. 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), DDD (degenerative disc disease), cervical, Difficult airway (10/22/2015), Diverticulosis, DM (diabetes mellitus), type 2 (HCC), Dysplasia of prostate, Erectile dysfunction, Frequent falls, Heart failure with mildly reduced ejection fraction (HFmrEF) (HCC), 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 Surgical: Mr. Mcnair  has a past surgical history that includes Back surgery; Tonsillectomy; Esophagogastroduodenoscopy (egd) with propofol  (N/A, 08/21/2022); biopsy (08/21/2022); Colonoscopy with propofol  (N/A, 08/23/2022); Givens capsule study (N/A, 08/23/2022); polypectomy (08/23/2022); Submucosal tattoo injection (08/23/2022); biopsy (08/23/2022); Irrigation and debridement abscess (Left, 08/28/2022); Janae Mclean hole of cranium (Left, 10/22/2015); Laparoscopic right colectomy (Right, 09/28/2022); Ventral hernia repair (  N/A, 09/28/2022); Colon surgery (09/28/2022); Colonoscopy with propofol  (N/A, 02/28/2023); Esophagogastroduodenoscopy (egd) with propofol  (N/A, 02/28/2023); biopsy (02/28/2023); Inguinal hernia repair (Right, 03/09/2023); Hernia repair; Colonoscopy with propofol  (N/A, 04/25/2023); and polypectomy (04/25/2023). Family: family history includes ADD / ADHD in his daughter; Asthma in his son; Cancer in his maternal aunt.  Laboratory Chemistry Profile   Renal Lab Results  Component Value Date   BUN 16 05/25/2023   CREATININE 1.61 (H)  05/25/2023   BCR 11 12/22/2022   GFRAA >60 10/22/2015   GFRNONAA 44 (L) 05/25/2023    Hepatic Lab Results  Component Value Date   AST 54 (H) 05/12/2023   ALT 27 05/12/2023   ALBUMIN 3.7 05/12/2023   ALKPHOS 83 05/12/2023   HCVAB NON REACTIVE 12/26/2021   LIPASE 35 11/12/2022    Electrolytes Lab Results  Component Value Date   NA 136 05/25/2023   K 4.1 05/25/2023   CL 105 05/25/2023   CALCIUM  8.6 (L) 05/25/2023   MG 2.1 10/06/2022   PHOS 2.7 10/06/2022    Bone Lab Results  Component Value Date   VD25OH 30.27 05/21/2022   TESTOSTERONE  368 11/24/2022    Inflammation (CRP: Acute Phase) (ESR: Chronic Phase) Lab Results  Component Value Date   CRP 22.2 (H) 12/25/2021   ESRSEDRATE 3 12/25/2021   LATICACIDVEN 1.0 11/12/2022         Note: Above Lab results reviewed.  Recent Imaging Review  CT SHOULDER RIGHT WO CONTRAST CLINICAL DATA:  Right shoulder pain  EXAM: CT OF THE UPPER RIGHT EXTREMITY WITHOUT CONTRAST  TECHNIQUE: Multidetector CT imaging of the upper right extremity was performed according to the standard protocol.  RADIATION DOSE REDUCTION: This exam was performed according to the departmental dose-optimization program which includes automated exposure control, adjustment of the mA and/or kV according to patient size and/or use of iterative reconstruction technique.  COMPARISON:  None Available.  FINDINGS: Bones/Joint/Cartilage  Subacute nondisplaced healing comminuted fracture of the acromion with callus formation. Normal alignment. Small joint effusion.  Mild osteoarthritis of the glenohumeral joint. Severe narrowing of the acromiohumeral distance.  Moderate arthropathy of the acromioclavicular joint. Incidental note made of an os acromiale.  Ligaments  Ligaments are suboptimally evaluated by CT.  Muscles and Tendons Moderate-severe atrophy of the supraspinatus and infraspinatus muscles. Complete full-thickness, full width tear of  the supraspinatus and infraspinatus tendons. Moderate atrophy of the subscapularis tendon.  Soft tissue No fluid collection or hematoma. No soft tissue mass. Visualized portions of the lung are clear. Mild anasarca.  IMPRESSION: 1. Subacute nondisplaced healing comminuted fracture of the acromion with callus formation. 2. Complete full-thickness, full width tear of the supraspinatus and infraspinatus tendons. Moderate-severe atrophy of the supraspinatus and infraspinatus muscles. 3. Moderate atrophy of the subscapularis tendon. 4. Mild osteoarthritis of the glenohumeral joint.  Electronically Signed   By: Onnie Bilis M.D.   On: 05/18/2023 13:42 Note: Reviewed         Physical Exam  General appearance: Well nourished, well developed, and well hydrated. In no apparent acute distress Mental status: Alert, oriented x 3 (person, place, & time)       Respiratory: No evidence of acute respiratory distress Eyes: PERLA Vitals: There were no vitals taken for this visit. BMI: Estimated body mass index is 29.39 kg/m as calculated from the following:   Height as of 04/25/23: 6\' 1"  (1.854 m).   Weight as of 05/31/23: 222 lb 12.8 oz (101.1 kg). Ideal: Ideal body weight: 79.9 kg (176 lb 2.4 oz)  Adjusted ideal body weight: 88.4 kg (194 lb 12.9 oz)  Assessment   Diagnosis Status  1. Chronic pain syndrome    Controlled Controlled Controlled   Plan of Care  Assessment and Plan             Pharmacotherapy (Medications Ordered): No orders of the defined types were placed in this encounter.  Orders:  No orders of the defined types were placed in this encounter.  Follow-up plan:   No follow-ups on file.    Recent Visits Date Type Provider Dept  03/17/23 Office Visit Cephus Collin, MD [facility]  Showing recent visits within past 90 days and meeting all other requirements Future Appointments Date Type Provider Dept  06/09/23 Appointment Jakala Herford K, NP [facility]  Showing future appointments within next 90 days and meeting all other requirements  I discussed the assessment and treatment plan with the patient. The patient was provided an opportunity to ask questions and all were answered. The patient agreed with the plan and demonstrated an understanding of the instructions.  Patient advised to call back or seek an in-person evaluation if the symptoms or condition worsens.  Duration of encounter: *** minutes.  Total time on encounter, as per AMA guidelines included both the face-to-face and non-face-to-face time personally spent by the physician and/or other qualified health care professional(s) on the day of the encounter (includes time in activities that require the physician or other qualified health care professional and does not include time in activities normally performed by clinical staff). Physician's time may include the following activities when performed: Preparing to see the patient (e.g., pre-charting review of records, searching for previously ordered imaging, lab work, and nerve conduction tests) Review of prior analgesic pharmacotherapies. Reviewing PMP Interpreting ordered tests (e.g., lab work, imaging, nerve conduction tests) Performing post-procedure evaluations, including interpretation of diagnostic procedures Obtaining and/or reviewing separately obtained history Performing a medically appropriate examination and/or evaluation Counseling and educating the patient/family/caregiver Ordering medications, tests, or procedures Referring and communicating with other health care professionals (when not separately reported) Documenting clinical information in the electronic or other health record Independently interpreting results (not separately reported) and communicating results to the patient/ family/caregiver Care coordination (not separately reported)  Note by: Stela Iwasaki K Fawn Desrocher, NP (TTS and AI technology used. I apologize  for any typographical errors that were not detected and corrected.) Date: 06/09/2023; Time: 12:15 PM

## 2023-06-09 ENCOUNTER — Encounter: Payer: Self-pay | Admitting: Nurse Practitioner

## 2023-06-09 ENCOUNTER — Ambulatory Visit
Payer: No Typology Code available for payment source | Attending: Student in an Organized Health Care Education/Training Program | Admitting: Nurse Practitioner

## 2023-06-09 VITALS — BP 134/63 | HR 58 | Temp 97.4°F | Resp 16 | Ht 73.0 in | Wt 225.0 lb

## 2023-06-09 DIAGNOSIS — M542 Cervicalgia: Secondary | ICD-10-CM | POA: Diagnosis present

## 2023-06-09 DIAGNOSIS — Z79899 Other long term (current) drug therapy: Secondary | ICD-10-CM | POA: Diagnosis not present

## 2023-06-09 DIAGNOSIS — M48062 Spinal stenosis, lumbar region with neurogenic claudication: Secondary | ICD-10-CM | POA: Insufficient documentation

## 2023-06-09 DIAGNOSIS — M5412 Radiculopathy, cervical region: Secondary | ICD-10-CM | POA: Diagnosis present

## 2023-06-09 DIAGNOSIS — Z5181 Encounter for therapeutic drug level monitoring: Secondary | ICD-10-CM | POA: Insufficient documentation

## 2023-06-09 DIAGNOSIS — M5416 Radiculopathy, lumbar region: Secondary | ICD-10-CM | POA: Insufficient documentation

## 2023-06-09 DIAGNOSIS — Z79891 Long term (current) use of opiate analgesic: Secondary | ICD-10-CM | POA: Insufficient documentation

## 2023-06-09 DIAGNOSIS — G894 Chronic pain syndrome: Secondary | ICD-10-CM | POA: Diagnosis present

## 2023-06-09 DIAGNOSIS — G8929 Other chronic pain: Secondary | ICD-10-CM | POA: Diagnosis present

## 2023-06-09 MED ORDER — OXYCODONE-ACETAMINOPHEN 10-325 MG PO TABS: 1.0000 | ORAL_TABLET | Freq: Three times a day (TID) | ORAL | 0 refills | Status: DC | PRN

## 2023-06-09 MED ORDER — OXYCODONE-ACETAMINOPHEN 10-325 MG PO TABS: 1.0000 | ORAL_TABLET | Freq: Three times a day (TID) | ORAL | 0 refills | Status: AC | PRN

## 2023-06-09 NOTE — Progress Notes (Signed)
 Nursing Pain Medication Assessment:  Safety precautions to be maintained throughout the outpatient stay will include: orient to surroundings, keep bed in low position, maintain call bell within reach at all times, provide assistance with transfer out of bed and ambulation.  Medication Inspection Compliance: Pill count conducted under aseptic conditions, in front of the patient. Neither the pills nor the bottle was removed from the patient's sight at any time. Once count was completed pills were immediately returned to the patient in their original bottle.  Medication: Oxycodone /APAP Pill/Patch Count: 6 of 90 pills/patches remain Pill/Patch Appearance: Markings consistent with prescribed medication Bottle Appearance: Standard pharmacy container. Clearly labeled. Filled Date: 01 / 27 / 2025 Last Medication intake:  Today

## 2023-06-09 NOTE — Patient Instructions (Signed)
Medication Rules  Applies to: All patients receiving prescriptions (written or electronic).  Pharmacy of record: Pharmacy where electronic prescriptions will be sent. If written prescriptions are taken to a different pharmacy, please inform the nursing staff. The pharmacy listed in the electronic medical record should be the one where you would like electronic prescriptions to be sent.  Prescription refills: Only during scheduled appointments. Applies to both, written and electronic prescriptions.  NOTE: The following applies primarily to controlled substances (Opioid* Pain Medications).   Patient's responsibilities: Pain Pills: Bring all pain pills to every appointment (except for procedure appointments). Pill Bottles: Bring pills in original pharmacy bottle. Always bring newest bottle. Bring bottle, even if empty. Medication refills: You are responsible for knowing and keeping track of what medications you need refilled. The day before your appointment, write a list of all prescriptions that need to be refilled. Bring that list to your appointment and give it to the admitting nurse. Prescriptions will be written only during appointments. If you forget a medication, it will not be "Called in", "Faxed", or "electronically sent". You will need to get another appointment to get these prescribed. Prescription Accuracy: You are responsible for carefully inspecting your prescriptions before leaving our office. Have the discharge nurse carefully go over each prescription with you, before taking them home. Make sure that your name is accurately spelled, that your address is correct. Check the name and dose of your medication to make sure it is accurate. Check the number of pills, and the written instructions to make sure they are clear and accurate. Make sure that you are given enough medication to last until your next medication refill appointment. Taking Medication: Take medication as prescribed. Never  take more pills than instructed. Never take medication more frequently than prescribed. Taking less pills or less frequently is permitted and encouraged, when it comes to controlled substances (written prescriptions).  Inform other Doctors: Always inform, all of your healthcare providers, of all the medications you take. Pain Medication from other Providers: You are not allowed to accept any additional pain medication from any other Doctor or Healthcare provider. There are two exceptions to this rule. (see below) In the event that you require additional pain medication, you are responsible for notifying us, as stated below. Medication Agreement: You are responsible for carefully reading and following our Medication Agreement. This must be signed before receiving any prescriptions from our practice. Safely store a copy of your signed Agreement. Violations to the Agreement will result in no further prescriptions. (Additional copies of our Medication Agreement are available upon request.) Laws, Rules, & Regulations: All patients are expected to follow all Federal and State Laws, Statutes, Rules, & Regulations. Ignorance of the Laws does not constitute a valid excuse. The use of any illegal substances is prohibited. Adopted CDC guidelines & recommendations: Target dosing levels will be at or below 60 MME/day. Use of benzodiazepines** is not recommended.  Exceptions: There are only two exceptions to the rule of not receiving pain medications from other Healthcare Providers. Exception #1 (Emergencies): In the event of an emergency (i.e.: accident requiring emergency care), you are allowed to receive additional pain medication. However, you are responsible for: As soon as you are able, call our office [phone], at any time of the day or night, and leave a message stating your name, the date and nature of the emergency, and the name and dose of the medication prescribed. In the event that your call is answered  by a member of   our staff, make sure to document and save the date, time, and the name of the person that took your information.  Exception #2 (Planned Surgery): In the event that you are scheduled by another doctor or dentist to have any type of surgery or procedure, you are allowed (for a period no longer than 30 days), to receive additional pain medication, for the acute post-op pain. However, in this case, you are responsible for picking up a copy of our "Post-op Pain Management for Surgeons" handout, and giving it to your surgeon or dentist. This document is available at our office, and does not require an appointment to obtain it. Simply go to our office during business hours (Monday-Thursday from 8:00 AM to 4:00 PM) (Friday 8:00 AM to 12:00 Noon) or if you have a scheduled appointment with us, prior to your surgery, and ask for it by name. In addition, you will need to provide us with your name, name of your surgeon, type of surgery, and date of procedure or surgery.  *Opioid medications include: morphine, codeine, oxycodone, oxymorphone, hydrocodone, hydromorphone, meperidine, tramadol, tapentadol, buprenorphine, fentanyl, methadone. **Benzodiazepine medications include: diazepam (Valium), alprazolam (Xanax), clonazepam (Klonopine), lorazepam (Ativan), clorazepate (Tranxene), chlordiazepoxide (Librium), estazolam (Prosom), oxazepam (Serax), temazepam (Restoril), triazolam (Halcion) (Last updated: 03/24/2017)  

## 2023-06-15 ENCOUNTER — Encounter (HOSPITAL_COMMUNITY): Payer: Self-pay | Admitting: Emergency Medicine

## 2023-06-15 ENCOUNTER — Encounter (HOSPITAL_COMMUNITY): Payer: Self-pay

## 2023-06-17 ENCOUNTER — Ambulatory Visit: Attending: Cardiology | Admitting: Cardiology

## 2023-06-17 ENCOUNTER — Encounter: Payer: Self-pay | Admitting: Cardiology

## 2023-06-17 VITALS — BP 130/60 | HR 58 | Ht 73.0 in | Wt 223.8 lb

## 2023-06-17 DIAGNOSIS — I10 Essential (primary) hypertension: Secondary | ICD-10-CM

## 2023-06-17 DIAGNOSIS — I428 Other cardiomyopathies: Secondary | ICD-10-CM | POA: Diagnosis not present

## 2023-06-17 NOTE — Progress Notes (Signed)
 Cardiology Office Note:    Date:  06/17/2023   ID:  Paul Grave [name], DOB [birth year], MRN [id number]  PCP:  Lenell Query, PA-C   [facility] Providers Cardiologist:  Constancia Delton, MD     Referring MD: Lenell Query,*   No chief complaint on file.   History of Present Illness:    Paul Birkeland [name] is a 78 y.o. male with a hx of NICM EF 43%, LBBB, hypertension, diabetes, CLL s/p chemo who presents for follow up.  Patient denies chest pain or shortness of breath, he is compliant with his medications as prescribed.  Followed up at heart failure service, GDMT was titrated.  Cardiac PET was scheduled but patient had on day of test scan was rescheduled.  Denies edema.  Doing okay, has no concerns at this time.   Prior notes/testing Coronary CT 8/24 minimal LAD calcification less than 25% stenosis. Echo 7/24 EF 45-50%  Past Medical History:  Diagnosis Date   Adenoma of left adrenal gland    Anemia    Aortic atherosclerosis (HCC)    Blood transfusion without reported diagnosis    BPH (benign prostatic hyperplasia)    Bradycardia    CAD (coronary artery disease) 08/26/2022   a.) cCTA 08/26/2022: Ca2+ 14.3 (12th %'ile for age/sex.race match control); (<25%) pLAD   Cellulitis of left thigh 08/2022   Chronic pain syndrome    a.) on COT managed by pain management   Chronic, continuous use of opioids    a.) chronic pain syndrome/chronic back pain; managed by pain management   CKD (chronic kidney disease) stage 3, GFR 30-59 ml/min (HCC)    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   a.) Rai stage III   DDD (degenerative disc disease), cervical    Difficult airway 10/22/2015   a.) 1st attempt with glidescope  --> macroglossic (unsuccessful); b.) 2nd attempt with Mac 4 and direct laryngoscopy with a 8.0 ETT --> tube too large to pass through the cords; b.) 3rd attempt (successful) with a Mac 4 and a 7.5 tube   Diverticulosis     DM (diabetes mellitus), type 2 (HCC)    Dysplasia of prostate    Erectile dysfunction    Frequent falls    Heart failure with mildly reduced ejection fraction (HFmrEF) (HCC)    a.) TTE 10/23/2015: EF 60-65%, mod-sev LVH, mild biatrial dil, degen MV disease, AoV sclerosis, asc Ao 38 mm, G1DD; b.) TTE 08/05/2022: EF 45-50%, mod-sev LVH (speckled pattern), sev biatrial dil, mild MR, AoV sclerosis, G1DD   Hepatic flexure mass 08/23/2022   a.) colonoscopy 08/23/2022: 14 x 15 mm partially obstructing ulcerated distal ascending colon mass; tissue friable --> pathology resulted as tubular adenoma, however felt to be false (-) --> referral to sugery and colectomy recommended; b.) s/p RIGHT hemicolectomy 09/28/2022   History of MRSA infection 08/28/2022   a.) MRSA PCR (+) 08/28/2022; culture from LEFT thigh abscess   HTN (hypertension)    Hyperlipidemia    Hypogonadism in male    Hypothyroidism    IDA (iron  deficiency anemia)    LBBB (left bundle branch block)    Long-term use of aspirin  therapy    Lumbar spinal stenosis    Mild cardiomegaly    Multiple lung nodules on CT    NICM (nonischemic cardiomyopathy) (HCC)    a.) TTE 10/23/2015: EF 60-65; b.) TTE 08/05/2022: EF 45-50%   Orchitis and epididymitis 06/16/2013   OSA on CPAP    Palindromic rheumatism,  hand    Pericardial effusion    Prostatitis    Sepsis (HCC) 08/2022   Subdural hematoma (HCC) 10/22/2015   a.) s/p traumatic mechanical fall --> CT head 10/22/2015: high-density SDH the left cerebral convexity with13 mm midline shift --> s/p LEFT frontal burr hole craniotomy   Tubular adenoma of colon    Ventral hernia     Past Surgical History:  Procedure Laterality Date   BACK SURGERY     BIOPSY  08/21/2022   Procedure: BIOPSY;  Surgeon: Quintin Buckle, DO;  Location: [facility] ENDOSCOPY;  Service: Gastroenterology;;   BIOPSY  08/23/2022   Procedure: BIOPSY;  Surgeon: Quintin Buckle, DO;  Location: [facility] ENDOSCOPY;  Service:  Gastroenterology;;   BIOPSY  02/28/2023   Procedure: BIOPSY;  Surgeon: Quintin Buckle, DO;  Location: [facility] ENDOSCOPY;  Service: Gastroenterology;;   Kasandra Pain OF CRANIUM Left 10/22/2015   COLON SURGERY  09/28/2022   COLONOSCOPY WITH PROPOFOL  N/A 08/23/2022   Procedure: COLONOSCOPY WITH PROPOFOL ;  Surgeon: Quintin Buckle, DO;  Location: [facility] ENDOSCOPY;  Service: Gastroenterology;  Laterality: N/A;   COLONOSCOPY WITH PROPOFOL  N/A 02/28/2023   Procedure: COLONOSCOPY WITH PROPOFOL ;  Surgeon: Quintin Buckle, DO;  Location: [facility] ENDOSCOPY;  Service: Gastroenterology;  Laterality: N/A;  DM   COLONOSCOPY WITH PROPOFOL  N/A 04/25/2023   Procedure: COLONOSCOPY WITH PROPOFOL ;  Surgeon: Quintin Buckle, DO;  Location: [facility] ENDOSCOPY;  Service: Gastroenterology;  Laterality: N/A;  DM   ESOPHAGOGASTRODUODENOSCOPY (EGD) WITH PROPOFOL  N/A 08/21/2022   Procedure: ESOPHAGOGASTRODUODENOSCOPY (EGD) WITH PROPOFOL ;  Surgeon: Quintin Buckle, DO;  Location: [facility] ENDOSCOPY;  Service: Gastroenterology;  Laterality: N/A;   ESOPHAGOGASTRODUODENOSCOPY (EGD) WITH PROPOFOL  N/A 02/28/2023   Procedure: ESOPHAGOGASTRODUODENOSCOPY (EGD) WITH PROPOFOL ;  Surgeon: Quintin Buckle, DO;  Location: [facility] ENDOSCOPY;  Service: Gastroenterology;  Laterality: N/A;   GIVENS CAPSULE STUDY N/A 08/23/2022   Procedure: GIVENS CAPSULE STUDY;  Surgeon: Quintin Buckle, DO;  Location: [facility] ENDOSCOPY;  Service: Gastroenterology;  Laterality: N/A;   HERNIA REPAIR     INGUINAL HERNIA REPAIR Right 03/09/2023   Procedure: HERNIA REPAIR INGUINAL ADULT, open, RNFA to assist;  Surgeon: Alben Alma, MD;  Location: [facility] ORS;  Service: General;  Laterality: Right;   IRRIGATION AND DEBRIDEMENT ABSCESS Left 08/28/2022   Procedure: IRRIGATION AND DEBRIDEMENT ABSCESS LEFT UPPER THIGH/GROIN;  Surgeon: Alben Alma, MD;  Location: [facility] ORS;  Service: General;  Laterality: Left;   LAPAROSCOPIC RIGHT COLECTOMY Right  09/28/2022   Procedure: LAPAROSCOPIC RIGHT COLECTOMY, RNFA to assist;  Surgeon: Alben Alma, MD;  Location: [facility] ORS;  Service: General;  Laterality: Right;   POLYPECTOMY  08/23/2022   Procedure: POLYPECTOMY;  Surgeon: Quintin Buckle, DO;  Location: [facility] ENDOSCOPY;  Service: Gastroenterology;;   POLYPECTOMY  04/25/2023   Procedure: POLYPECTOMY;  Surgeon: Quintin Buckle, DO;  Location: [facility] ENDOSCOPY;  Service: Gastroenterology;;   SUBMUCOSAL TATTOO INJECTION  08/23/2022   Procedure: SUBMUCOSAL TATTOO INJECTION;  Surgeon: Quintin Buckle, DO;  Location: [facility] ENDOSCOPY;  Service: Gastroenterology;;   TONSILLECTOMY     VENTRAL HERNIA REPAIR N/A 09/28/2022   Procedure: HERNIA REPAIR VENTRAL ADULT;  Surgeon: Alben Alma, MD;  Location: [facility] ORS;  Service: General;  Laterality: N/A;    Current Medications: Current Meds  Medication Sig   aspirin  EC 81 MG tablet Take 1 tablet (81 mg total) by mouth daily. Swallow whole.   empagliflozin  (JARDIANCE ) 10 MG TABS tablet Take 1 tablet (10 mg total) by mouth daily before breakfast.  hydrALAZINE  (APRESOLINE ) 50 MG tablet Take 2 tablets (100 mg total) by mouth 2 (two) times daily.   levothyroxine  (SYNTHROID ) 200 MCG tablet Take 1 tablet (200 mcg total) by mouth daily before breakfast.   metFORMIN  (GLUCOPHAGE ) 500 MG tablet Take 2 tablets (1,000 mg total) by mouth 2 (two) times daily with a meal.   methocarbamol  (ROBAXIN -750) 750 MG tablet Take 1 tablet (750 mg total) by mouth every 8 (eight) hours as needed for muscle spasms.   metoprolol  succinate (TOPROL  XL) 25 MG 24 hr tablet Take 1 tablet (25 mg total) by mouth daily.   oxyCODONE -acetaminophen  (PERCOCET) 10-325 MG tablet Take 1 tablet by mouth every 8 (eight) hours as needed for pain. Must last 30 days.   [START ON 07/17/2023] oxyCODONE -acetaminophen  (PERCOCET) 10-325 MG tablet Take 1 tablet by mouth every 8 (eight) hours as needed for pain. Must last 30 days.   [START ON  08/16/2023] oxyCODONE -acetaminophen  (PERCOCET) 10-325 MG tablet Take 1 tablet by mouth every 8 (eight) hours as needed for pain. Must last 30 days.   rosuvastatin  (CRESTOR ) 40 MG tablet Take 1 tablet (40 mg total) by mouth daily.   sacubitril -valsartan  (ENTRESTO ) 97-103 MG Take 1 tablet by mouth 2 (two) times daily.   spironolactone  (ALDACTONE ) 25 MG tablet Take 0.5 tablets (12.5 mg total) by mouth daily.   zanubrutinib  (BRUKINSA ) 80 MG capsule Take 2 capsules (160 mg total) by mouth 2 (two) times daily.     Allergies:   Patient has no known allergies.   Social History   Socioeconomic History   Marital status: Married    Spouse name: Not on file   Number of children: Not on file   Years of education: Not on file   Highest education level: 5th grade  Occupational History   Not on file  Tobacco Use   Smoking status: Former    Types: Cigarettes    Passive exposure: Past   Smokeless tobacco: Never   Tobacco comments:    Stop smoking iver 40  plus years ago  Vaping Use   Vaping status: Never Used  Substance and Sexual Activity   Alcohol use: No   Drug use: No   Sexual activity: Not Currently  Other Topics Concern   Not on file  Social History Narrative   Not on file   Social Drivers of Health   Financial Resource Strain: Patient Declined (04/26/2023)   Received from [facility]   Overall Financial Resource Strain (CARDIA)    Difficulty of Paying Living Expenses: Patient declined  Recent Concern: Financial Resource Strain - Medium Risk (02/24/2023)   Received from [facility]   Overall Financial Resource Strain (CARDIA)    Difficulty of Paying Living Expenses: Somewhat hard  Food Insecurity: Patient Declined (04/26/2023)   Received from [facility]   Hunger Vital Sign    Worried About Running Out of Food in the Last Year: Patient declined    Ran Out of Food in the Last Year: Patient declined  Recent Concern: Food Insecurity  - Food Insecurity Present (02/24/2023)   Received from [facility]   Hunger Vital Sign    Worried About Running Out of Food in the Last Year: Sometimes true    Ran Out of Food in the Last Year: Sometimes true  Transportation Needs: No Transportation Needs (04/26/2023)   Received from [facility] - Transportation    In the past 12 months, has lack  of transportation kept you from medical appointments or from getting medications?: No    Lack of Transportation (Non-Medical): No  Physical Activity: Unknown (07/26/2022)   Exercise Vital Sign    Days of Exercise per Week: Patient declined    Minutes of Exercise per Session: Not on file  Stress: Stress Concern Present (07/26/2022)   [employer] of Occupational Health - Occupational Stress Questionnaire    Feeling of Stress : To some extent  Social Connections: Socially Integrated (12/20/2022)   Social Connection and Isolation Panel [NHANES]    Frequency of Communication with Friends and Family: More than three times a week    Frequency of Social Gatherings with Friends and Family: Patient declined    Attends Religious Services: More than 4 times per year    Active Member of [employer] or Organizations: Yes    Attends Banker Meetings: Never    Marital Status: Married     Family History: The patient's family history includes ADD / ADHD in his daughter; Asthma in his son; Cancer in his maternal aunt. There is no history of Prostate cancer, Kidney disease, Kidney cancer, or Bladder Cancer.  ROS:   Please see the history of present illness.     All other systems reviewed and are negative.  EKGs/Labs/Other Studies Reviewed:    The following studies were reviewed today:   EKG Interpretation Date/Time:  Friday Jun 17 2023 16:06:46 EDT Ventricular Rate:  58 PR Interval:  128 QRS Duration:  158 QT Interval:  454 QTC Calculation: 445 R Axis:   -44  Text Interpretation: Sinus  bradycardia Left axis deviation Left bundle branch block Confirmed by Constancia Delton ([id number]) on 06/17/2023 4:11:22 PM    Recent Labs: 10/06/2022: Magnesium 2.1 12/22/2022: TSH 27.300 05/12/2023: ALT 27 05/25/2023: BUN 16; Creatinine 1.61; Potassium 4.1; Sodium 136 06/01/2023: Hemoglobin 9.4; Platelet Count 198  Recent Lipid Panel    Component Value Date/Time   CHOL 134 12/22/2022 1422   TRIG 74 12/22/2022 1422   HDL 72 12/22/2022 1422   CHOLHDL 1.9 12/22/2022 1422   CHOLHDL 3.0 07/06/2021 1030   LDLCALC 47 12/22/2022 1422   LDLCALC 105 (H) 07/06/2021 1030   LDLDIRECT 84 10/29/2022 0950     Risk Assessment/Calculations:               Physical Exam:    VS:  BP 130/60 (BP Location: Left Arm, Patient Position: Sitting, Cuff Size: Normal)   Pulse (!) 58   Ht 6\' 1"  (1.854 m)   Wt 223 lb 12.8 oz (101.5 kg)   SpO2 98%   BMI 29.53 kg/m     Wt Readings from Last 3 Encounters:  06/17/23 223 lb 12.8 oz (101.5 kg)  06/09/23 225 lb (102.1 kg)  05/31/23 222 lb 12.8 oz (101.1 kg)     GEN:  Well nourished, well developed in no acute distress HEENT: Normal NECK: No JVD; No carotid bruits CARDIAC: RRR, no murmurs, rubs, gallops RESPIRATORY:  Clear to auscultation without rales, wheezing or rhonchi  ABDOMEN: Soft, non-tender, non-distended MUSCULOSKELETAL:  No edema; No deformity  SKIN: Warm and dry NEUROLOGIC:  Alert and oriented x 3 PSYCHIATRIC:  Normal affect   ASSESSMENT:    1. NICM (nonischemic cardiomyopathy) (HCC)   2. Primary hypertension    PLAN:    In order of problems listed above:  Nonischemic cardiomyopathy, EF 43% on CMR.  Patient is euvolemic.  Etiology possibly hypertension, LBBB.  Continue Toprol -XL 25 mg daily, Entresto  97/103 mg twice  daily, Aldactone  12.5 mg daily, hydralazine  100 mg twice daily.  Appreciate input from heart failure service.  Cardiac PET to evaluate possible sarcoid ordered. Hypertension, BP controlled.  Toprol -XL, Entresto , Aldactone ,  hydralazine  as above.  Follow-up in 6 months.      Medication Adjustments/Labs and Tests Ordered: Current medicines are reviewed at length with the patient today.  Concerns regarding medicines are outlined above.  Orders Placed This Encounter  Procedures   EKG 12-Lead   No orders of the defined types were placed in this encounter.   Patient Instructions  Medication Instructions:  Your physician recommends that you continue on your current medications as directed. Please refer to the Current Medication list given to you today.   *If you need a refill on your cardiac medications before your next appointment, please call your pharmacy*  Lab Work: No labs ordered today   Testing/Procedures: No test ordered today   Follow-Up: At [facility], you and your health needs are our priority.  As part of our continuing mission to provide you with exceptional heart care, our providers are all part of one team.  This team includes your primary Cardiologist (physician) and Advanced Practice Providers or APPs (Physician Assistants and Nurse Practitioners) who all work together to provide you with the care you need, when you need it.  Your next appointment:   6 month(s)  Provider:   You may see Constancia Delton, MD or one of the following Advanced Practice Providers on your designated Care Team:   Laneta Pintos, NP Gildardo Labrador, PA-C Varney Gentleman, PA-C Cadence Gennaro Khat, PA-C Ronald Cockayne, NP Morey Ar, NP       Signed, Constancia Delton, MD  06/17/2023 4:46 PM    [facility]

## 2023-06-17 NOTE — Patient Instructions (Signed)
 Medication Instructions:  Your physician recommends that you continue on your current medications as directed. Please refer to the Current Medication list given to you today.   *If you need a refill on your cardiac medications before your next appointment, please call your pharmacy*  Lab Work: No labs ordered today   Testing/Procedures: No test ordered today   Follow-Up: At [facility], you and your health needs are our priority.  As part of our continuing mission to provide you with exceptional heart care, our providers are all part of one team.  This team includes your primary Cardiologist (physician) and Advanced Practice Providers or APPs (Physician Assistants and Nurse Practitioners) who all work together to provide you with the care you need, when you need it.  Your next appointment:   6 month(s)  Provider:   You may see Constancia Delton, MD or one of the following Advanced Practice Providers on your designated Care Team:   Laneta Pintos, NP Gildardo Labrador, PA-C Varney Gentleman, PA-C Cadence [place], PA-C Ronald Cockayne, NP Morey Ar, NP

## 2023-06-21 ENCOUNTER — Other Ambulatory Visit: Payer: Self-pay

## 2023-06-21 ENCOUNTER — Telehealth (HOSPITAL_COMMUNITY): Payer: Self-pay | Admitting: Emergency Medicine

## 2023-06-21 NOTE — Telephone Encounter (Signed)
 Reaching out to patient to offer assistance regarding upcoming cardiac imaging study; pt verbalizes understanding of appt date/time, parking situation and where to check in, pre-test NPO status and medications ordered, and verified current allergies; name and call back number provided for further questions should they arise Jinger Mount RN Navigator Cardiac Imaging [facility] [phone] office [phone] cell  Pt verbalized understanding of protein only diet for PET scan thurs. Also states he received printed instructions in the mail.

## 2023-06-22 ENCOUNTER — Inpatient Hospital Stay

## 2023-06-23 ENCOUNTER — Other Ambulatory Visit: Payer: Self-pay

## 2023-06-23 ENCOUNTER — Inpatient Hospital Stay

## 2023-06-23 ENCOUNTER — Emergency Department

## 2023-06-23 ENCOUNTER — Inpatient Hospital Stay
Admission: EM | Admit: 2023-06-23 | Discharge: 2023-06-28 | DRG: 558 | Disposition: A | Discharge status: Home / Self Care | Source: Ambulatory Visit | Attending: Internal Medicine | Admitting: Internal Medicine

## 2023-06-23 ENCOUNTER — Encounter (HOSPITAL_COMMUNITY)
Admission: RE | Admit: 2023-06-23 | Discharge: 2023-06-23 | Disposition: A | Discharge status: Home / Self Care | Source: Ambulatory Visit | Attending: Internal Medicine | Admitting: Internal Medicine

## 2023-06-23 DIAGNOSIS — E1122 Type 2 diabetes mellitus with diabetic chronic kidney disease: Secondary | ICD-10-CM | POA: Diagnosis present

## 2023-06-23 DIAGNOSIS — N400 Enlarged prostate without lower urinary tract symptoms: Secondary | ICD-10-CM | POA: Diagnosis present

## 2023-06-23 DIAGNOSIS — M25861 Other specified joint disorders, right knee: Secondary | ICD-10-CM | POA: Diagnosis not present

## 2023-06-23 DIAGNOSIS — I700 Atherosclerosis of aorta: Secondary | ICD-10-CM | POA: Diagnosis present

## 2023-06-23 DIAGNOSIS — Z87891 Personal history of nicotine dependence: Secondary | ICD-10-CM

## 2023-06-23 DIAGNOSIS — I5022 Chronic systolic (congestive) heart failure: Secondary | ICD-10-CM | POA: Insufficient documentation

## 2023-06-23 DIAGNOSIS — M71161 Other infective bursitis, right knee: Secondary | ICD-10-CM | POA: Diagnosis present

## 2023-06-23 DIAGNOSIS — I428 Other cardiomyopathies: Secondary | ICD-10-CM | POA: Insufficient documentation

## 2023-06-23 DIAGNOSIS — Z7982 Long term (current) use of aspirin: Secondary | ICD-10-CM | POA: Diagnosis not present

## 2023-06-23 DIAGNOSIS — E1129 Type 2 diabetes mellitus with other diabetic kidney complication: Secondary | ICD-10-CM | POA: Insufficient documentation

## 2023-06-23 DIAGNOSIS — I130 Hypertensive heart and chronic kidney disease with heart failure and stage 1 through stage 4 chronic kidney disease, or unspecified chronic kidney disease: Secondary | ICD-10-CM | POA: Diagnosis present

## 2023-06-23 DIAGNOSIS — M2580 Other specified joint disorders, unspecified joint: Secondary | ICD-10-CM | POA: Diagnosis not present

## 2023-06-23 DIAGNOSIS — G4733 Obstructive sleep apnea (adult) (pediatric): Secondary | ICD-10-CM | POA: Diagnosis present

## 2023-06-23 DIAGNOSIS — D631 Anemia in chronic kidney disease: Secondary | ICD-10-CM | POA: Diagnosis present

## 2023-06-23 DIAGNOSIS — Z7984 Long term (current) use of oral hypoglycemic drugs: Secondary | ICD-10-CM

## 2023-06-23 DIAGNOSIS — M7989 Other specified soft tissue disorders: Secondary | ICD-10-CM | POA: Diagnosis present

## 2023-06-23 DIAGNOSIS — M1711 Unilateral primary osteoarthritis, right knee: Secondary | ICD-10-CM | POA: Diagnosis present

## 2023-06-23 DIAGNOSIS — B9689 Other specified bacterial agents as the cause of diseases classified elsewhere: Principal | ICD-10-CM

## 2023-06-23 DIAGNOSIS — N1831 Chronic kidney disease, stage 3a: Secondary | ICD-10-CM | POA: Diagnosis present

## 2023-06-23 DIAGNOSIS — M25561 Pain in right knee: Secondary | ICD-10-CM | POA: Diagnosis present

## 2023-06-23 DIAGNOSIS — E039 Hypothyroidism, unspecified: Secondary | ICD-10-CM | POA: Diagnosis present

## 2023-06-23 DIAGNOSIS — Z79890 Hormone replacement therapy (postmenopausal): Secondary | ICD-10-CM

## 2023-06-23 DIAGNOSIS — C9110 Chronic lymphocytic leukemia of B-cell type not having achieved remission: Secondary | ICD-10-CM | POA: Diagnosis present

## 2023-06-23 DIAGNOSIS — D509 Iron deficiency anemia, unspecified: Secondary | ICD-10-CM

## 2023-06-23 DIAGNOSIS — M11261 Other chondrocalcinosis, right knee: Secondary | ICD-10-CM | POA: Diagnosis present

## 2023-06-23 DIAGNOSIS — J449 Chronic obstructive pulmonary disease, unspecified: Secondary | ICD-10-CM | POA: Diagnosis present

## 2023-06-23 DIAGNOSIS — E876 Hypokalemia: Secondary | ICD-10-CM | POA: Diagnosis not present

## 2023-06-23 DIAGNOSIS — M7051 Other bursitis of knee, right knee: Secondary | ICD-10-CM | POA: Diagnosis not present

## 2023-06-23 DIAGNOSIS — L02415 Cutaneous abscess of right lower limb: Secondary | ICD-10-CM | POA: Diagnosis not present

## 2023-06-23 DIAGNOSIS — I10 Essential (primary) hypertension: Secondary | ICD-10-CM | POA: Diagnosis not present

## 2023-06-23 DIAGNOSIS — J439 Emphysema, unspecified: Secondary | ICD-10-CM | POA: Diagnosis not present

## 2023-06-23 DIAGNOSIS — G894 Chronic pain syndrome: Secondary | ICD-10-CM | POA: Diagnosis present

## 2023-06-23 DIAGNOSIS — D849 Immunodeficiency, unspecified: Secondary | ICD-10-CM | POA: Diagnosis present

## 2023-06-23 DIAGNOSIS — E785 Hyperlipidemia, unspecified: Secondary | ICD-10-CM | POA: Diagnosis present

## 2023-06-23 DIAGNOSIS — I2510 Atherosclerotic heart disease of native coronary artery without angina pectoris: Secondary | ICD-10-CM | POA: Diagnosis present

## 2023-06-23 DIAGNOSIS — M25461 Effusion, right knee: Secondary | ICD-10-CM | POA: Diagnosis not present

## 2023-06-23 DIAGNOSIS — I517 Cardiomegaly: Secondary | ICD-10-CM | POA: Diagnosis not present

## 2023-06-23 DIAGNOSIS — R600 Localized edema: Secondary | ICD-10-CM | POA: Diagnosis present

## 2023-06-23 DIAGNOSIS — Z79899 Other long term (current) drug therapy: Secondary | ICD-10-CM

## 2023-06-23 LAB — BASIC METABOLIC PANEL WITH GFR
Anion gap: 6 (ref 5–15)
BUN: 14 mg/dL (ref 8–23)
CO2: 22 mmol/L (ref 22–32)
Calcium: 8.4 mg/dL — ABNORMAL LOW (ref 8.9–10.3)
Chloride: 109 mmol/L (ref 98–111)
Creatinine, Ser: 1.24 mg/dL (ref 0.61–1.24)
GFR, Estimated: 60 mL/min — ABNORMAL LOW (ref >60)
Glucose, Bld: 113 mg/dL — ABNORMAL HIGH (ref 70–99)
Potassium: 3.6 mmol/L (ref 3.5–5.1)
Sodium: 137 mmol/L (ref 135–145)

## 2023-06-23 LAB — CBC WITH DIFFERENTIAL/PLATELET
Abs Immature Granulocytes: 0.04 10*3/uL (ref 0.00–0.07)
Basophils Absolute: 0.1 10*3/uL (ref 0.0–0.1)
Basophils Relative: 1 %
Eosinophils Absolute: 0.1 10*3/uL (ref 0.0–0.5)
Eosinophils Relative: 1 %
HCT: 31.2 % — ABNORMAL LOW (ref 39.0–52.0)
Hemoglobin: 9.9 g/dL — ABNORMAL LOW (ref 13.0–17.0)
Immature Granulocytes: 0 %
Lymphocytes Relative: 26 %
Lymphs Abs: 2.7 10*3/uL (ref 0.7–4.0)
MCH: 29.1 pg (ref 26.0–34.0)
MCHC: 31.7 g/dL (ref 30.0–36.0)
MCV: 91.8 fL (ref 80.0–100.0)
Monocytes Absolute: 1.4 10*3/uL — ABNORMAL HIGH (ref 0.1–1.0)
Monocytes Relative: 13 %
Neutro Abs: 6.3 10*3/uL (ref 1.7–7.7)
Neutrophils Relative %: 59 %
Platelets: 160 10*3/uL (ref 150–400)
RBC: 3.4 MIL/uL — ABNORMAL LOW (ref 4.22–5.81)
RDW: 21.5 % — ABNORMAL HIGH (ref 11.5–15.5)
WBC: 10.5 10*3/uL (ref 4.0–10.5)
nRBC: 0 % (ref 0.0–0.2)

## 2023-06-23 LAB — PROTIME-INR
INR: 1.3 — ABNORMAL HIGH (ref 0.8–1.2)
Prothrombin Time: 16.6 s — ABNORMAL HIGH (ref 11.4–15.2)

## 2023-06-23 LAB — LACTIC ACID, PLASMA: Lactic Acid, Venous: 1.2 mmol/L (ref 0.5–1.9)

## 2023-06-23 LAB — NM PET CT MYOCARDIAL SARCOIDOSIS
Nuc Stress EF: 21 %
Rest Nuclear Isotope Dose: 26.2 mCi

## 2023-06-23 LAB — CBC
HCT: 31.1 % — ABNORMAL LOW (ref 39.0–52.0)
Hemoglobin: 9.8 g/dL — ABNORMAL LOW (ref 13.0–17.0)
MCH: 28.8 pg (ref 26.0–34.0)
MCHC: 31.5 g/dL (ref 30.0–36.0)
MCV: 91.5 fL (ref 80.0–100.0)
Platelets: 155 10*3/uL (ref 150–400)
RBC: 3.4 MIL/uL — ABNORMAL LOW (ref 4.22–5.81)
RDW: 21.5 % — ABNORMAL HIGH (ref 11.5–15.5)
WBC: 10.3 10*3/uL (ref 4.0–10.5)
nRBC: 0 % (ref 0.0–0.2)

## 2023-06-23 LAB — BRAIN NATRIURETIC PEPTIDE: B Natriuretic Peptide: 2392.6 pg/mL — ABNORMAL HIGH (ref 0.0–100.0)

## 2023-06-23 LAB — URIC ACID: Uric Acid, Serum: 5.3 mg/dL (ref 3.7–8.6)

## 2023-06-23 LAB — C-REACTIVE PROTEIN: CRP: 11.8 mg/dL — ABNORMAL HIGH (ref <1.0)

## 2023-06-23 LAB — SEDIMENTATION RATE: Sed Rate: 37 mm/h — ABNORMAL HIGH (ref 0–20)

## 2023-06-23 LAB — APTT: aPTT: 38 s — ABNORMAL HIGH (ref 24–36)

## 2023-06-23 LAB — GLUCOSE, CAPILLARY: Glucose-Capillary: 166 mg/dL — ABNORMAL HIGH (ref 70–99)

## 2023-06-23 MED ORDER — INSULIN ASPART 100 UNIT/ML IJ SOLN: 0.0000 [IU] | Freq: Every day | INTRAMUSCULAR | Status: DC

## 2023-06-23 MED ORDER — LEVOTHYROXINE SODIUM 50 MCG PO TABS
200.0000 ug | ORAL_TABLET | Freq: Every day | ORAL | Status: DC
  Administered 2023-06-24 – 2023-06-28 (×5): 200 ug via ORAL
  Filled 2023-06-23 (×5): qty 4

## 2023-06-23 MED ORDER — ONDANSETRON HCL 4 MG/2ML IJ SOLN: 4.0000 mg | Freq: Three times a day (TID) | INTRAMUSCULAR | Status: DC | PRN

## 2023-06-23 MED ORDER — MORPHINE SULFATE (PF) 4 MG/ML IV SOLN
4.0000 mg | Freq: Once | INTRAVENOUS | Status: AC
  Administered 2023-06-23: 4 mg via INTRAVENOUS
  Filled 2023-06-23: qty 1

## 2023-06-23 MED ORDER — OXYCODONE-ACETAMINOPHEN 5-325 MG PO TABS
1.0000 | ORAL_TABLET | ORAL | Status: DC | PRN
  Administered 2023-06-24 – 2023-06-27 (×11): 1 via ORAL
  Filled 2023-06-23 (×11): qty 1

## 2023-06-23 MED ORDER — SODIUM CHLORIDE 0.9 % IV SOLN
2.0000 g | Freq: Once | INTRAVENOUS | Status: AC
  Administered 2023-06-23: 2 g via INTRAVENOUS
  Filled 2023-06-23: qty 20

## 2023-06-23 MED ORDER — VANCOMYCIN HCL IN DEXTROSE 1-5 GM/200ML-% IV SOLN
1000.0000 mg | Freq: Once | INTRAVENOUS | Status: AC
  Administered 2023-06-23: 1000 mg via INTRAVENOUS
  Filled 2023-06-23: qty 200

## 2023-06-23 MED ORDER — INSULIN ASPART 100 UNIT/ML IJ SOLN
0.0000 [IU] | Freq: Three times a day (TID) | INTRAMUSCULAR | Status: DC
  Filled 2023-06-23: qty 1

## 2023-06-23 MED ORDER — SPIRONOLACTONE 12.5 MG HALF TABLET
12.5000 mg | ORAL_TABLET | Freq: Every day | ORAL | Status: DC
  Filled 2023-06-23: qty 1

## 2023-06-23 MED ORDER — METOPROLOL SUCCINATE ER 25 MG PO TB24
25.0000 mg | ORAL_TABLET | Freq: Every day | ORAL | Status: DC
  Administered 2023-06-24 – 2023-06-28 (×4): 25 mg via ORAL
  Filled 2023-06-23 (×4): qty 1

## 2023-06-23 MED ORDER — FUROSEMIDE 20 MG PO TABS
20.0000 mg | ORAL_TABLET | Freq: Every day | ORAL | Status: DC
  Administered 2023-06-23: 20 mg via ORAL
  Filled 2023-06-23: qty 1

## 2023-06-23 MED ORDER — ALBUTEROL SULFATE (2.5 MG/3ML) 0.083% IN NEBU: 3.0000 mL | INHALATION_SOLUTION | RESPIRATORY_TRACT | Status: DC | PRN

## 2023-06-23 MED ORDER — SACUBITRIL-VALSARTAN 97-103 MG PO TABS
1.0000 | ORAL_TABLET | Freq: Two times a day (BID) | ORAL | Status: DC
  Administered 2023-06-23 – 2023-06-28 (×10): 1 via ORAL
  Filled 2023-06-23 (×10): qty 1

## 2023-06-23 MED ORDER — HYDRALAZINE HCL 20 MG/ML IJ SOLN: 5.0000 mg | INTRAMUSCULAR | Status: DC | PRN

## 2023-06-23 MED ORDER — SODIUM CHLORIDE 0.9 % IV SOLN
2.0000 g | INTRAVENOUS | Status: DC
  Administered 2023-06-24: 2 g via INTRAVENOUS
  Filled 2023-06-23: qty 20

## 2023-06-23 MED ORDER — RUBIDIUM RB82 GENERATOR (RUBYFILL)
26.2000 | PACK | Freq: Once | INTRAVENOUS | Status: AC
  Administered 2023-06-23: 26.2 via INTRAVENOUS

## 2023-06-23 MED ORDER — VANCOMYCIN HCL 1750 MG/350ML IV SOLN
1750.0000 mg | INTRAVENOUS | Status: DC
  Administered 2023-06-24 – 2023-06-25 (×2): 1750 mg via INTRAVENOUS
  Filled 2023-06-23 (×2): qty 350

## 2023-06-23 MED ORDER — ACETAMINOPHEN 500 MG PO TABS
1000.0000 mg | ORAL_TABLET | Freq: Once | ORAL | Status: AC
  Administered 2023-06-23: 1000 mg via ORAL
  Filled 2023-06-23: qty 2

## 2023-06-23 MED ORDER — ACETAMINOPHEN 325 MG PO TABS
650.0000 mg | ORAL_TABLET | Freq: Four times a day (QID) | ORAL | Status: DC | PRN
  Administered 2023-06-24 – 2023-06-25 (×2): 650 mg via ORAL
  Filled 2023-06-23 (×2): qty 2

## 2023-06-23 MED ORDER — HEPARIN SODIUM (PORCINE) 5000 UNIT/ML IJ SOLN
5000.0000 [IU] | Freq: Three times a day (TID) | INTRAMUSCULAR | Status: DC
  Administered 2023-06-23 – 2023-06-28 (×12): 5000 [IU] via SUBCUTANEOUS
  Filled 2023-06-23 (×13): qty 1

## 2023-06-23 MED ORDER — ROSUVASTATIN CALCIUM 20 MG PO TABS
40.0000 mg | ORAL_TABLET | Freq: Every day | ORAL | Status: DC
  Administered 2023-06-24 – 2023-06-28 (×5): 40 mg via ORAL
  Filled 2023-06-23 (×2): qty 2
  Filled 2023-06-23 (×2): qty 4
  Filled 2023-06-23: qty 2

## 2023-06-23 MED ORDER — FLUDEOXYGLUCOSE F - 18 (FDG) INJECTION
26.2000 | Freq: Once | INTRAVENOUS | Status: AC
  Administered 2023-06-23: 8.9 via INTRAVENOUS

## 2023-06-23 MED ORDER — DM-GUAIFENESIN ER 30-600 MG PO TB12: 1.0000 | ORAL_TABLET | Freq: Two times a day (BID) | ORAL | Status: DC | PRN

## 2023-06-23 NOTE — ED Notes (Signed)
 See triage note  Presents with right knee pain  States he was working and was on his knees  Developed pain about 3 days ago Pain is mainly to anterior right knee

## 2023-06-23 NOTE — ED Triage Notes (Signed)
 Pt to ED via POV from [facility]. Pt reports right knee pain x3-4 days and is now unable to bear weight. Pt reports pain has gotten worse in the last 48hrs. 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 KC MD concerned for septic prepatellar bursitis.

## 2023-06-23 NOTE — Progress Notes (Signed)
 Pharmacy Antibiotic Note  Paul Briggs. is a 78 y.o. male admitted on 06/23/2023 with septic prepatellar bursitis of right knee.  Pharmacy has been consulted for Vancomycin  dosing.  Plan: Pt given Vancomycin  2000 mg once. Vancomycin  1750 mg IV Q 24 hrs. Goal AUC 400-550. Expected AUC: 482.5 SCr used: 1.24  Pharmacy will continue to follow and will adjust abx dosing whenever warranted.  Temp (24hrs), Avg:99.9 F (37.7 C), Min:98.9 F (37.2 C), Max:100.9 F (38.3 C)   Recent Labs  Lab 06/23/23 1741  WBC 10.5  10.3  CREATININE 1.24  LATICACIDVEN 1.2    Estimated Creatinine Clearance: 61.6 mL/min (by C-G formula based on SCr of 1.24 mg/dL).    No Known Allergies  Antimicrobials this admission: 5/29 Ceftriaxone  >>  5/29 Vancomycin  >>  Microbiology results: 5/29 BCx: Pending  Thank you for allowing pharmacy to be a part of this patient's care.  [name], PharmD, [facility] 06/23/2023 10:18 PM

## 2023-06-23 NOTE — H&P (Signed)
 History and Physical    Paul Briggs ONG:[id number] DOB: 07/06/1945 DOA: 06/23/2023  Referring MD/NP/PA:   PCP: Lenell Query, PA-C   Patient coming from:  The patient is coming from home.     Chief Complaint: Right knee pain, swelling  HPI: Paul Mancillas [name] is a 78 y.o. male with medical history significant of CLL, HTN, HLD, DM, hypothyroidism, testicular mass, CKD-3, BPH, prostatitis, orchitis, epididymitis, SDH, OSA not on CPAP, chronic pain, who presents with right knee pain and swelling.  Patient states that he was crawling underneath his house 3 days ago to fix something, was not wearing kneepads at the time, then he developed right knee pain and swelling.  He also has fever and chills today.  The right knee pain is constant, aching, moderate, nonradiating, aggravated by movement and bending.  His right lower leg is also swelling.  He denies any injury.  Patient does not have chest pain, cough, SOB.  No nausea, vomiting, diarrhea or abdominal pain.  No symptoms of UTI. Of note, patient had right knee pain which required admission on 12/25/2021 with unclear etiology.  Patient had negative synovial fluid culture and normal uric acid level in that admission.  Data reviewed independently and ED Course: pt was found to have uric acid of 5.3, WBC 10.5, stable renal function with GFR 60, lactic acid of 1.2, BNP 2392, Temperature 100.9, blood pressure 146/84, heart rate 74, RR 18, oxygen  saturation 98% on room air.  Patient is admitted to telemetry bed as inpatient.     X-ray of right knee: Mild tricompartment arthritis with chondrocalcinosis and small knee effusion.   EKG: Not done in ED, will get one.    Review of Systems:   General: has fevers, chills, no body weight gain, has fatigue HEENT: no blurry vision, hearing changes or sore throat Respiratory: no dyspnea, coughing, wheezing CV: no chest pain, no palpitations GI: no nausea, vomiting, abdominal  pain, diarrhea, constipation GU: no dysuria, burning on urination, increased urinary frequency, hematuria  Ext: has 1+ right lower leg edema. Has right knee swelling and pain Neuro: no unilateral weakness, numbness, or tingling, no vision change or hearing loss Skin: no rash, no skin tear. MSK: No muscle spasm, no deformity, no limitation of range of movement in spin Heme: No easy bruising.  Travel history: No recent long distant travel.   Allergy: No Known Allergies  Past Medical History:  Diagnosis Date   Adenoma of left adrenal gland    Anemia    Aortic atherosclerosis (HCC)    Blood transfusion without reported diagnosis    BPH (benign prostatic hyperplasia)    Bradycardia    CAD (coronary artery disease) 08/26/2022   a.) cCTA 08/26/2022: Ca2+ 14.3 (12th %'ile for age/sex.race match control); (<25%) pLAD   Cellulitis of left thigh 08/2022   Chronic pain syndrome    a.) on COT managed by pain management   Chronic, continuous use of opioids    a.) chronic pain syndrome/chronic back pain; managed by pain management   CKD (chronic kidney disease) stage 3, GFR 30-59 ml/min (HCC)    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   a.) Rai stage III   DDD (degenerative disc disease), cervical    Difficult airway 10/22/2015   a.) 1st attempt with glidescope  --> macroglossic (unsuccessful); b.) 2nd attempt with Mac 4 and direct laryngoscopy with a 8.0 ETT --> tube too large to pass through the cords; b.) 3rd attempt (successful) with a  Mac 4 and a 7.5 tube   Diverticulosis    DM (diabetes mellitus), type 2 (HCC)    Dysplasia of prostate    Erectile dysfunction    Frequent falls    Heart failure with mildly reduced ejection fraction (HFmrEF) (HCC)    a.) TTE 10/23/2015: EF 60-65%, mod-sev LVH, mild biatrial dil, degen MV disease, AoV sclerosis, asc Ao 38 mm, G1DD; b.) TTE 08/05/2022: EF 45-50%, mod-sev LVH (speckled pattern), sev biatrial dil, mild MR, AoV sclerosis, G1DD    Hepatic flexure mass 08/23/2022   a.) colonoscopy 08/23/2022: 14 x 15 mm partially obstructing ulcerated distal ascending colon mass; tissue friable --> pathology resulted as tubular adenoma, however felt to be false (-) --> referral to sugery and colectomy recommended; b.) s/p RIGHT hemicolectomy 09/28/2022   History of MRSA infection 08/28/2022   a.) MRSA PCR (+) 08/28/2022; culture from LEFT thigh abscess   HTN (hypertension)    Hyperlipidemia    Hypogonadism in male    Hypothyroidism    IDA (iron  deficiency anemia)    LBBB (left bundle branch block)    Long-term use of aspirin  therapy    Lumbar spinal stenosis    Mild cardiomegaly    Multiple lung nodules on CT    NICM (nonischemic cardiomyopathy) (HCC)    a.) TTE 10/23/2015: EF 60-65; b.) TTE 08/05/2022: EF 45-50%   Orchitis and epididymitis 06/16/2013   OSA on CPAP    Palindromic rheumatism, hand    Pericardial effusion    Prostatitis    Sepsis (HCC) 08/2022   Subdural hematoma (HCC) 10/22/2015   a.) s/p traumatic mechanical fall --> CT head 10/22/2015: high-density SDH the left cerebral convexity with13 mm midline shift --> s/p LEFT frontal burr hole craniotomy   Tubular adenoma of colon    Ventral hernia     Past Surgical History:  Procedure Laterality Date   BACK SURGERY     BIOPSY  08/21/2022   Procedure: BIOPSY;  Surgeon: Quintin Buckle, DO;  Location: [facility] ENDOSCOPY;  Service: Gastroenterology;;   BIOPSY  08/23/2022   Procedure: BIOPSY;  Surgeon: Quintin Buckle, DO;  Location: [facility] ENDOSCOPY;  Service: Gastroenterology;;   BIOPSY  02/28/2023   Procedure: BIOPSY;  Surgeon: Quintin Buckle, DO;  Location: [facility] ENDOSCOPY;  Service: Gastroenterology;;   Kasandra Pain OF CRANIUM Left 10/22/2015   COLON SURGERY  09/28/2022   COLONOSCOPY WITH PROPOFOL  N/A 08/23/2022   Procedure: COLONOSCOPY WITH PROPOFOL ;  Surgeon: Quintin Buckle, DO;  Location: [facility] ENDOSCOPY;  Service: Gastroenterology;  Laterality:  N/A;   COLONOSCOPY WITH PROPOFOL  N/A 02/28/2023   Procedure: COLONOSCOPY WITH PROPOFOL ;  Surgeon: Quintin Buckle, DO;  Location: [facility] ENDOSCOPY;  Service: Gastroenterology;  Laterality: N/A;  DM   COLONOSCOPY WITH PROPOFOL  N/A 04/25/2023   Procedure: COLONOSCOPY WITH PROPOFOL ;  Surgeon: Quintin Buckle, DO;  Location: [facility] ENDOSCOPY;  Service: Gastroenterology;  Laterality: N/A;  DM   ESOPHAGOGASTRODUODENOSCOPY (EGD) WITH PROPOFOL  N/A 08/21/2022   Procedure: ESOPHAGOGASTRODUODENOSCOPY (EGD) WITH PROPOFOL ;  Surgeon: Quintin Buckle, DO;  Location: [facility] ENDOSCOPY;  Service: Gastroenterology;  Laterality: N/A;   ESOPHAGOGASTRODUODENOSCOPY (EGD) WITH PROPOFOL  N/A 02/28/2023   Procedure: ESOPHAGOGASTRODUODENOSCOPY (EGD) WITH PROPOFOL ;  Surgeon: Quintin Buckle, DO;  Location: [facility] ENDOSCOPY;  Service: Gastroenterology;  Laterality: N/A;   GIVENS CAPSULE STUDY N/A 08/23/2022   Procedure: GIVENS CAPSULE STUDY;  Surgeon: Quintin Buckle, DO;  Location: [facility] ENDOSCOPY;  Service: Gastroenterology;  Laterality: N/A;   HERNIA REPAIR     INGUINAL  HERNIA REPAIR Right 03/09/2023   Procedure: HERNIA REPAIR INGUINAL ADULT, open, RNFA to assist;  Surgeon: Alben Alma, MD;  Location: [facility] ORS;  Service: General;  Laterality: Right;   IRRIGATION AND DEBRIDEMENT ABSCESS Left 08/28/2022   Procedure: IRRIGATION AND DEBRIDEMENT ABSCESS LEFT UPPER THIGH/GROIN;  Surgeon: Alben Alma, MD;  Location: [facility] ORS;  Service: General;  Laterality: Left;   LAPAROSCOPIC RIGHT COLECTOMY Right 09/28/2022   Procedure: LAPAROSCOPIC RIGHT COLECTOMY, RNFA to assist;  Surgeon: Alben Alma, MD;  Location: [facility] ORS;  Service: General;  Laterality: Right;   POLYPECTOMY  08/23/2022   Procedure: POLYPECTOMY;  Surgeon: Quintin Buckle, DO;  Location: [facility] ENDOSCOPY;  Service: Gastroenterology;;   POLYPECTOMY  04/25/2023   Procedure: POLYPECTOMY;  Surgeon: Quintin Buckle, DO;  Location: [facility]  ENDOSCOPY;  Service: Gastroenterology;;   SUBMUCOSAL TATTOO INJECTION  08/23/2022   Procedure: SUBMUCOSAL TATTOO INJECTION;  Surgeon: Quintin Buckle, DO;  Location: [facility] ENDOSCOPY;  Service: Gastroenterology;;   TONSILLECTOMY     VENTRAL HERNIA REPAIR N/A 09/28/2022   Procedure: HERNIA REPAIR VENTRAL ADULT;  Surgeon: Alben Alma, MD;  Location: [facility] ORS;  Service: General;  Laterality: N/A;    Social History:  reports that he has quit smoking. His smoking use included cigarettes. He has been exposed to tobacco smoke. He has never used smokeless tobacco. He reports that he does not drink alcohol and does not use drugs.  Family History:  Family History  Problem Relation Age of Onset   Cancer Maternal Aunt    ADD / ADHD Daughter    Asthma Son    Prostate cancer Neg Hx    Kidney disease Neg Hx    Kidney cancer Neg Hx    Bladder Cancer Neg Hx      Prior to Admission medications   Medication Sig Start Date End Date Taking? Authorizing Provider  aspirin  EC 81 MG tablet Take 1 tablet (81 mg total) by mouth daily. Swallow whole. 08/09/22   Furth, Cadence H, PA-C  empagliflozin  (JARDIANCE ) 10 MG TABS tablet Take 1 tablet (10 mg total) by mouth daily before breakfast. 05/24/23   Bensimhon, Rheta Celestine, MD  hydrALAZINE  (APRESOLINE ) 50 MG tablet Take 2 tablets (100 mg total) by mouth 2 (two) times daily. 05/05/23   Bensimhon, Rheta Celestine, MD  levothyroxine  (SYNTHROID ) 200 MCG tablet Take 1 tablet (200 mcg total) by mouth daily before breakfast. 12/27/22   Normie Becton, FNP  metFORMIN  (GLUCOPHAGE ) 500 MG tablet Take 2 tablets (1,000 mg total) by mouth 2 (two) times daily with a meal. 11/24/22   Normie Becton, FNP  methocarbamol  (ROBAXIN -750) 750 MG tablet Take 1 tablet (750 mg total) by mouth every 8 (eight) hours as needed for muscle spasms. 05/13/23   Coralyn Derry, PA-C  metoprolol  succinate (TOPROL  XL) 25 MG 24 hr tablet Take 1 tablet (25 mg total) by mouth daily. 05/05/23   Bensimhon, Rheta Celestine, MD  oxyCODONE -acetaminophen  (PERCOCET) 10-325 MG tablet Take 1 tablet by mouth every 8 (eight) hours as needed for pain. Must last 30 days. 06/17/23 07/17/23  Patel, Seema K, NP  oxyCODONE -acetaminophen  (PERCOCET) 10-325 MG tablet Take 1 tablet by mouth every 8 (eight) hours as needed for pain. Must last 30 days. 07/17/23 08/16/23  Patel, Seema K, NP  oxyCODONE -acetaminophen  (PERCOCET) 10-325 MG tablet Take 1 tablet by mouth every 8 (eight) hours as needed for pain. Must last 30 days. 08/16/23 09/15/23  Patel, Seema K, NP  rosuvastatin  (CRESTOR ) 40  MG tablet Take 1 tablet (40 mg total) by mouth daily. 05/05/23 08/03/23  Bensimhon, Rheta Celestine, MD  sacubitril -valsartan  (ENTRESTO ) 97-103 MG Take 1 tablet by mouth 2 (two) times daily. 05/24/23   Bensimhon, Rheta Celestine, MD  spironolactone  (ALDACTONE ) 25 MG tablet Take 0.5 tablets (12.5 mg total) by mouth daily. 05/05/23 08/03/23  Bensimhon, Rheta Celestine, MD  zanubrutinib  (BRUKINSA ) 80 MG capsule Take 2 capsules (160 mg total) by mouth 2 (two) times daily. 03/17/23   [name], MD    Physical Exam: Vitals:   06/23/23 1736 06/23/23 1817 06/23/23 2037  BP: (!) 146/84  (!) 156/74  Pulse: 74  62  Resp: 18  16  Temp: (!) 100.9 F (38.3 C)  98.9 F (37.2 C)  TempSrc: Oral  Oral  SpO2: 98%  100%  Weight:  101.5 kg 98.4 kg  Height:  6\' 1"  (1.854 m) 6\' 1"  (1.854 m)   General: Not in acute distress HEENT:       Eyes: PERRL, EOMI, no jaundice       ENT: No discharge from the ears and nose, no pharynx injection, no tonsillar enlargement.        Neck: No JVD, no bruit, no mass felt. Heme: No neck lymph node enlargement. Cardiac: S1/S2, RRR, No murmurs, No gallops or rubs. Respiratory: No rales, wheezing, rhonchi or rubs. GI: Soft, nondistended, nontender, no rebound pain, no organomegaly, BS present. GU: No hematuria Ext: has 1+ pitting leg edema in right lower leg. Has erythema, swelling, warmth, tenderness over inferior aspect of right patella of the  knee. Musculoskeletal: No joint deformities, No joint redness or warmth, no limitation of ROM in spin. Skin: No rashes.  Neuro: Alert, oriented X3, cranial nerves II-XII grossly intact, moves all extremities normally. Psych: Patient is not psychotic, no suicidal or hemocidal ideation.  Labs on Admission: I have personally reviewed following labs and imaging studies  CBC: Recent Labs  Lab 06/23/23 1741  WBC 10.5  10.3  NEUTROABS 6.3  HGB 9.9*  9.8*  HCT 31.2*  31.1*  MCV 91.8  91.5  PLT 160  155   Basic Metabolic Panel: Recent Labs  Lab 06/23/23 1741  NA 137  K 3.6  CL 109  CO2 22  GLUCOSE 113*  BUN 14  CREATININE 1.24  CALCIUM  8.4*   GFR: Estimated Creatinine Clearance: 61.6 mL/min (by C-G formula based on SCr of 1.24 mg/dL). Liver Function Tests: No results for input(s): "AST", "ALT", "ALKPHOS", "BILITOT", "PROT", "ALBUMIN" in the last 168 hours. No results for input(s): "LIPASE", "AMYLASE" in the last 168 hours. No results for input(s): "AMMONIA" in the last 168 hours. Coagulation Profile: Recent Labs  Lab 06/23/23 2000  INR 1.3*   Cardiac Enzymes: No results for input(s): "CKTOTAL", "CKMB", "CKMBINDEX", "TROPONINI" in the last 168 hours. BNP (last 3 results) No results for input(s): "PROBNP" in the last 8760 hours. HbA1C: No results for input(s): "HGBA1C" in the last 72 hours. CBG: Recent Labs  Lab 06/23/23 2049  GLUCAP 166*   Lipid Profile: No results for input(s): "CHOL", "HDL", "LDLCALC", "TRIG", "CHOLHDL", "LDLDIRECT" in the last 72 hours. Thyroid  Function Tests: No results for input(s): "TSH", "T4TOTAL", "FREET4", "T3FREE", "THYROIDAB" in the last 72 hours. Anemia Panel: No results for input(s): "VITAMINB12", "FOLATE", "FERRITIN", "TIBC", "IRON ", "RETICCTPCT" in the last 72 hours. Urine analysis:    Component Value Date/Time   COLORURINE YELLOW (A) 11/12/2022 0633   APPEARANCEUR CLEAR (A) 11/12/2022 0633   APPEARANCEUR Clear 10/04/2019  1128  LABSPEC 1.021 11/12/2022 0633   LABSPEC 1.019 06/16/2013 1607   PHURINE 6.0 11/12/2022 0633   GLUCOSEU >=500 (A) 11/12/2022 0633   GLUCOSEU Negative 06/16/2013 1607   HGBUR NEGATIVE 11/12/2022 0633   BILIRUBINUR NEGATIVE 11/12/2022 0633   BILIRUBINUR Negative 07/24/2020 1013   BILIRUBINUR Negative 10/04/2019 1128   BILIRUBINUR Negative 06/16/2013 1607   KETONESUR 5 (A) 11/12/2022 0633   PROTEINUR 30 (A) 11/12/2022 0633   UROBILINOGEN 0.2 07/24/2020 1013   NITRITE NEGATIVE 11/12/2022 0633   LEUKOCYTESUR NEGATIVE 11/12/2022 0633   LEUKOCYTESUR Negative 06/16/2013 1607   Sepsis Labs: @LABRCNTIP (procalcitonin:4,lacticidven:4) )No results found for this or any previous visit (from the past 240 hours).   Radiological Exams on Admission:   Assessment/Plan Principal Problem:   Right knee pain Active Problems:   Septic prepatellar bursitis of right knee   CLL (chronic lymphocytic leukemia) (HCC)   Chronic systolic CHF (congestive heart failure) (HCC)   Essential hypertension   COPD (chronic obstructive pulmonary disease) (HCC)   Chronic kidney disease, stage 3a (HCC)   HLD (hyperlipidemia)   Hypothyroidism   IDA (iron  deficiency anemia)   Leg edema, right   Type II diabetes mellitus with renal manifestations (HCC)   Assessment and Plan:   Right knee pain due to septic prepatellar bursitis of right knee: Patient has fever 100.9, but does not meet criteria for sepsis (WBC 10.5, temperature 100.9, heart rate 74, RR 18, lactic acid 1.2). uric acid normal 5.3. EDP has reviewed with Dr. Clyda Dark of orthopedics, who agrees with plan for IV antibiotics, no focal fluctuance noted at this time that would be amenable to drainage.    - will admit to tele bed as inpatient - Empiric antimicrobial treatment with vancomycin  and Rocephin  - PRN Zofran  for nausea, and Tylenol , Percocet for pain - Blood cultures x 2  - ESR and CRP  CLL (chronic lymphocytic leukemia) (HCC): Patient is  not taking Brukinsa  currently -F/u with Dr. Wilhelmenia Harada of oncology  Chronic systolic CHF (congestive heart failure) (HCC): 2D echo on 08/05/2022 showed EF of 45-50% with grade 1 diastolic dysfunction.  Patient does not have SOB, but has elevated BNP 2392.  Patient is high risk of developing CHF exacerbation.  Patient is not taking Lasix  and spironolactone  currently. -Will start home Lasix  20 mg daily and spironolactone , and Entresto   Essential hypertension -Entresto , metformin , Lasix , spironolactone  - IV hydralazine  as needed  COPD (chronic obstructive pulmonary disease) (HCC): Stable -Bronchodilators and as needed Mucinex   Chronic kidney disease, stage 3a (HCC): Renal function stable.  Recent baseline creatinine 1.61 on 05/25/2023.  His creatinine is 1.24, BUN 14, GFR 60. -Follow-up repeat CBC  HLD (hyperlipidemia) -Crestor   Hypothyroidism -Synthroid   IDA (iron  deficiency anemia): Hemoglobin stable 9.9 (9.4 on 06/01/2023) - Follow-up with CBC  Leg edema, right -f/u lower extremity venous Doppler to rule out  DVT  Type II diabetes mellitus with renal manifestations [facility]): Recent A1c 5.9, well-controlled.  Patient is taking Jardiance , metformin , ZOXWRU - Adding scale insulin      DVT ppx: SQ Heparin    Code Status: Full code   Family Communication:     not done, no family member is at bed side.     Disposition Plan:  Anticipate discharge back to previous environment  Consults called:  EDP has reviewed with Dr. Clyda Dark of orthopedics  Admission status and Level of care: Telemetry Medical:  as inpt        Dispo: The patient is from: {From:23814}  Anticipated d/c is to: {To:23815}              Anticipated d/c date is: {Days:23816}              Patient currently {Medically stable:23817}    Severity of Illness:  {Observation/Inpatient:21159}       Date of Service 06/23/2023    Fidencio Hue [facility]   If 7PM-7AM, please contact  night-coverage [email or web address] 06/23/2023, 10:10 PM

## 2023-06-23 NOTE — ED Provider Notes (Signed)
 [facility] Provider Note    Event Date/Time   First MD Initiated Contact with Patient 06/23/23 1810     (approximate)   History   Chief Complaint Knee Pain   HPI  Paul Hunnicutt [name] is a 78 y.o. male with past medical history of hypertension, diabetes, CKD, CLL, and chronic pain syndrome who presents to the ED complaining of knee pain.  Patient reports that he was crawling underneath his house 3 days ago to fix something, was not wearing kneepads at the time.  He denies any specific injury to his knee and did not notice any cuts, however has had increased pain and swelling just below his kneecap on the right since then.  He states the area is hot to touch but he has not had any wounds or drainage.  He has had difficulty bending his knee due to the pain.  Patient noted to be febrile in triage but he was not aware of any fevers at home.     Physical Exam   Triage Vital Signs: ED Triage Vitals  Encounter Vitals Group     BP 06/23/23 1736 (!) 146/84     Systolic BP Percentile --      Diastolic BP Percentile --      Pulse Rate 06/23/23 1736 74     Resp 06/23/23 1736 18     Temp 06/23/23 1736 (!) 100.9 F (38.3 C)     Temp Source 06/23/23 1736 Oral     SpO2 06/23/23 1736 98 %     Weight --      Height --      Head Circumference --      Peak Flow --      Pain Score 06/23/23 1737 10     Pain Loc --      Pain Education --      Exclude from Growth Chart --     Most recent vital signs: Vitals:   06/23/23 1736  BP: (!) 146/84  Pulse: 74  Resp: 18  Temp: (!) 100.9 F (38.3 C)  SpO2: 98%    Constitutional: Alert and oriented. Eyes: Conjunctivae are normal. Head: Atraumatic. Nose: No congestion/rhinnorhea. Mouth/Throat: Mucous membranes are moist.  Cardiovascular: Normal rate, regular rhythm. Grossly normal heart sounds.  2+ radial pulses bilaterally. Respiratory: Normal respiratory effort.  No retractions. Lungs CTAB. Gastrointestinal:  Soft and nontender. No distention. Musculoskeletal: Erythema, edema, warmth, and tenderness noted just inferior to the right patella.  No edema noted at the area of the knee joint and he is able to range his knee with moderate pain. Neurologic:  Normal speech and language. No gross focal neurologic deficits are appreciated.    ED Results / Procedures / Treatments   Labs (all labs ordered are listed, but only abnormal results are displayed) Labs Reviewed  CBC - Abnormal; Notable for the following components:      Result Value   RBC 3.40 (*)    Hemoglobin 9.8 (*)    HCT 31.1 (*)    RDW 21.5 (*)    All other components within normal limits  BASIC METABOLIC PANEL WITH GFR - Abnormal; Notable for the following components:   Glucose, Bld 113 (*)    Calcium  8.4 (*)    GFR, Estimated 60 (*)    All other components within normal limits  CBC WITH DIFFERENTIAL/PLATELET - Abnormal; Notable for the following components:   RBC 3.40 (*)    Hemoglobin 9.9 (*)  HCT 31.2 (*)    RDW 21.5 (*)    All other components within normal limits  CULTURE, BLOOD (ROUTINE X 2)  CULTURE, BLOOD (ROUTINE X 2)  LACTIC ACID, PLASMA    RADIOLOGY Right knee x-ray reviewed and interpreted by me with no fracture or dislocation.  PROCEDURES:  Critical Care performed: No  Procedures   MEDICATIONS ORDERED IN ED: Medications  morphine  (PF) 4 MG/ML injection 4 mg (has no administration in time range)  vancomycin  (VANCOCIN ) IVPB 1000 mg/200 mL premix (has no administration in time range)  cefTRIAXone  (ROCEPHIN ) 2 g in sodium chloride  0.9 % 100 mL IVPB (has no administration in time range)  acetaminophen  (TYLENOL ) tablet 1,000 mg (1,000 mg Oral Given 06/23/23 1831)     IMPRESSION / MDM / ASSESSMENT AND PLAN / ED COURSE  I reviewed the triage vital signs and the nursing notes.                              78 y.o. male with past medical history of hypertension, diabetes, CLL, CKD, and chronic pain syndrome  who presents to the ED complaining of increasing pain and swelling to area just below his right knee over the past 3 days.  Patient's presentation is most consistent with acute presentation with potential threat to life or bodily function.  Differential diagnosis includes, but is not limited to, septic arthritis, septic bursitis, cellulitis, abscess, fracture, dislocation.  Patient nontoxic-appearing and in no acute distress, vital signs remarkable for fever but otherwise reassuring, do not appear consistent with sepsis.  Labs without significant anemia, leukocytosis, electrolyte abnormality, or AKI.  Lactic acid within normal limits.  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  We will draw blood cultures given his immunocompromised state, x-ray unremarkable by my read.  Presentation most consistent with a infrapatellar septic bursitis, low suspicion for septic arthritis at this time.  Findings reviewed with Dr. Clyda Dark of orthopedics, who agrees with plan for IV antibiotics, no focal fluctuance noted at this time that would be amenable to drainage.  Patient would benefit from admission to the hospital given difficulty walking and immunocompromise.  Case discussed with hospitalist for admission.        FINAL CLINICAL IMPRESSION(S) / ED DIAGNOSES   Final diagnoses:  Septic infrapatellar bursitis of right knee     Rx / DC Orders   ED Discharge Orders     None        Note:  This document was prepared using Dragon voice recognition software and may include unintentional dictation errors.   Twilla Galea, MD 06/23/23 Trenia Fritter

## 2023-06-24 ENCOUNTER — Inpatient Hospital Stay

## 2023-06-24 ENCOUNTER — Other Ambulatory Visit: Payer: Self-pay

## 2023-06-24 DIAGNOSIS — M25561 Pain in right knee: Secondary | ICD-10-CM | POA: Diagnosis not present

## 2023-06-24 DIAGNOSIS — N1831 Chronic kidney disease, stage 3a: Secondary | ICD-10-CM | POA: Diagnosis not present

## 2023-06-24 DIAGNOSIS — M71161 Other infective bursitis, right knee: Secondary | ICD-10-CM | POA: Diagnosis not present

## 2023-06-24 DIAGNOSIS — I5022 Chronic systolic (congestive) heart failure: Secondary | ICD-10-CM | POA: Diagnosis not present

## 2023-06-24 LAB — CBC
HCT: 27.4 % — ABNORMAL LOW (ref 39.0–52.0)
Hemoglobin: 8.7 g/dL — ABNORMAL LOW (ref 13.0–17.0)
MCH: 28.7 pg (ref 26.0–34.0)
MCHC: 31.8 g/dL (ref 30.0–36.0)
MCV: 90.4 fL (ref 80.0–100.0)
Platelets: 140 10*3/uL — ABNORMAL LOW (ref 150–400)
RBC: 3.03 MIL/uL — ABNORMAL LOW (ref 4.22–5.81)
RDW: 21 % — ABNORMAL HIGH (ref 11.5–15.5)
WBC: 9.6 10*3/uL (ref 4.0–10.5)
nRBC: 0 % (ref 0.0–0.2)

## 2023-06-24 LAB — GLUCOSE, CAPILLARY
Glucose-Capillary: 112 mg/dL — ABNORMAL HIGH (ref 70–99)
Glucose-Capillary: 146 mg/dL — ABNORMAL HIGH (ref 70–99)
Glucose-Capillary: 162 mg/dL — ABNORMAL HIGH (ref 70–99)
Glucose-Capillary: 126 mg/dL — ABNORMAL HIGH (ref 70–99)

## 2023-06-24 LAB — BASIC METABOLIC PANEL WITH GFR
Anion gap: 6 (ref 5–15)
BUN: 14 mg/dL (ref 8–23)
CO2: 21 mmol/L — ABNORMAL LOW (ref 22–32)
Calcium: 7.9 mg/dL — ABNORMAL LOW (ref 8.9–10.3)
Chloride: 108 mmol/L (ref 98–111)
Creatinine, Ser: 1.2 mg/dL (ref 0.61–1.24)
GFR, Estimated: >60 mL/min (ref >60)
Glucose, Bld: 197 mg/dL — ABNORMAL HIGH (ref 70–99)
Potassium: 2.9 mmol/L — ABNORMAL LOW (ref 3.5–5.1)
Sodium: 135 mmol/L (ref 135–145)

## 2023-06-24 MED ORDER — POTASSIUM CHLORIDE CRYS ER 20 MEQ PO TBCR
40.0000 meq | EXTENDED_RELEASE_TABLET | ORAL | Status: AC
  Administered 2023-06-24 (×2): 40 meq via ORAL
  Filled 2023-06-24 (×2): qty 2

## 2023-06-24 MED ORDER — DICLOFENAC SODIUM 1 % EX GEL
4.0000 g | Freq: Four times a day (QID) | CUTANEOUS | Status: DC | PRN
  Administered 2023-06-24 – 2023-06-27 (×5): 4 g via TOPICAL
  Filled 2023-06-24: qty 100

## 2023-06-24 NOTE — Progress Notes (Signed)
 Progress Note   Patient: Paul Biggar [name] ZOX:[id number] DOB: Jul 18, 1945 DOA: 06/23/2023     1 DOS: the patient was seen and examined on 06/24/2023   Brief hospital course:  "Paul Renfroe [name] is a 78 y.o. male with medical history significant of CLL, HTN, HLD, DM, hypothyroidism, testicular mass, CKD-3, BPH, prostatitis, orchitis, epididymitis, SDH, OSA not on CPAP, chronic pain, who presents with right knee pain and swelling..." See H&P for full HPI on admission & ED course.  Patient was admitted and started on IV antibiotics for septic pre-patellar bursitis of the right knee.  Orthopedic surgery was consulted but did not identify any drain-able fluid collection.   Further hospital course and management as outlined below.    Assessment and Plan:  Right knee pain due to septic prepatellar bursitis of right knee: Patient has fever 100.9, but does not meet criteria for sepsis (WBC 10.5, temperature 100.9, heart rate 74, RR 18, lactic acid 1.2). uric acid normal 5.3. EDP has reviewed with Dr. Clyda Dark of orthopedics, who agrees with plan for IV antibiotics, no focal fluctuance noted at this time that would be amenable to drainage.   --Continue empiric IV vancomycin  and Rocephin  --Appreciate Orthopedic surgery input --Will see if IR can do U/S guided aspiration of the infrapatellar bursa --PRN Zofran  for nausea, and Tylenol , Percocet for pain --Follow blood cultures and inflammatory markers --PT evaluation  Hypokalemia - K 2.9 this AM --40 mEq x 2 doses Kcl to replace --Check Mg level --BMP in AM   CLL (chronic lymphocytic leukemia) (HCC): Patient is not taking Brukinsa  currently --F/u with Dr. Wilhelmenia Harada of oncology   Chronic systolic CHF (congestive heart failure) (HCC): 2D echo on 08/05/2022 showed EF of 45-50% with grade 1 diastolic dysfunction.  Patient does not have SOB, but has elevated BNP 2392.  Patient is high risk of developing CHF exacerbation.  Patient is not taking  Lasix  and spironolactone  currently. --On Entresto  --Dc'd Lasix  and spironolactone  per patient   Essential hypertension --Entresto  --Hold Lasix , spironolactone  -- dc'd orders as pt reports recently being told not to take these, he declines them --IV hydralazine  as needed   COPD (chronic obstructive pulmonary disease) (HCC): Stable --Bronchodilators and as needed Mucinex    Chronic kidney disease, stage 3a (HCC): Renal function stable.  Recent baseline creatinine 1.61 on 05/25/2023.  His creatinine is 1.24, BUN 14, GFR 60. --Follow CBC   HLD (hyperlipidemia) --Crestor    Hypothyroidism --Synthroid    IDA (iron  deficiency anemia): Hemoglobin stab2le 9.9 (9.4 on 06/01/2023) --Follow-up with CBC   Leg edema, right --f/u lower extremity venous Doppler to rule out  DVT -- read still pending   Type II diabetes mellitus with renal manifestations [facility]): Recent A1c 5.9, well-controlled.  Pt home regimen is Jardiance , metformin , Wegovy --Sliding scale Novolog       Subjective: Pt awake resting in bed this AM. He reports feeling better, knee still hurts worse with movement better in bed at rest.  No fever or chills. No other acute complaints.   Physical Exam: Vitals:   06/24/23 0320 06/24/23 0424 06/24/23 0731 06/24/23 1556  BP: (!) 152/80  (!) 143/78 (!) 148/74  Pulse: 71  66 67  Resp: 18  17 17   Temp: 98.8 F (37.1 C)  98.6 F (37 C) 98.5 F (36.9 C)  TempSrc: Oral  Oral Oral  SpO2: 95%  97% 100%  Weight:  98.4 kg    Height:       General exam: awake, alert, no acute  distress HEENT: atraumatic, clear conjunctiva, anicteric sclera, moist mucus membranes, hearing grossly normal  Respiratory system: CTAB, no wheezes, rales or rhonchi, normal respiratory effort. Cardiovascular system: normal S1/S2, RRR,  no pedal edema.   Gastrointestinal system: soft, NT, ND, no HSM felt, +bowel sounds. Central nervous system: A&O x 3. no gross focal neurologic deficits, normal speech Extremities:  right knee warmth and swelling with induration over the patella, no palpable fluid collection Psychiatry: normal mood, congruent affect, judgement and insight appear normal   Data Reviewed:  Notable labs --   K 2.9 Bicarb 21 Glucose 197 Ca 7.9 Hbg 8.7   Family Communication: None present. Pt updated in detail.  Disposition: Status is: Inpatient Remains inpatient appropriate because: remains on IV antibiotics   Planned Discharge Destination: Home    Time spent: 42 minutes  Author: Montey Apa, DO 06/24/2023 5:17 PM  For on call review [email or web address].

## 2023-06-24 NOTE — TOC Initial Note (Signed)
 Transition of Care (TOC) - Initial/Assessment Note    Patient Details  Name: Paul Drumwright [name] MRN: [id number] Date of Birth: 1945/04/27  Transition of Care [facility]) CM/SW Contact:    Alexandra Ice, RN Phone Number: 06/24/2023, 11:54 AM  Clinical Narrative:                  Patient lives with spouse, independent with ADLs. He has PCP in the community. No TOC needs identified at this time, will continue follow.        Patient Goals and CMS Choice            Expected Discharge Plan and Services                                              Prior Living Arrangements/Services                       Activities of Daily Living   ADL Screening (condition at time of admission) Independently performs ADLs?: Yes (appropriate for developmental age) Is the patient deaf or have difficulty hearing?: No Does the patient have difficulty seeing, even when wearing glasses/contacts?: No Does the patient have difficulty concentrating, remembering, or making decisions?: No  Permission Sought/Granted                  Emotional Assessment              Admission diagnosis:  Right knee pain [M25.561] Septic infrapatellar bursitis of right knee [M71.161, B96.89] 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   Postoperative surgical complication involving skin associated with non-dermatologic procedure 10/26/2022   Mass of colon  09/28/2022   Tubular adenoma of colon 09/03/2022   Fall 08/27/2022   Abscess of left groin 08/27/2022   Elevated troponin 08/26/2022   Contusion of groin, left, initial encounter 08/26/2022   Chronic, continuous use of opioids 08/26/2022   Cardiomyopathy (HCC) 08/26/2022   Acute on chronic anemia 08/20/2022   Cough 08/06/2022   Transaminitis 05/21/2022   Chronic right shoulder pain 02/25/2022   Cervical radicular pain 02/25/2022   Encounter for antineoplastic chemotherapy 02/06/2022   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/01/2022   IDA (iron  deficiency anemia) 01/01/2022   Right ankle pain 12/25/2021   Pneumonia 12/25/2021   Type II diabetes mellitus (HCC) 12/25/2021   Acute renal failure superimposed on stage 3a chronic kidney disease (HCC) 12/25/2021   HLD (hyperlipidemia) 12/25/2021   HTN (hypertension) 12/25/2021   Obesity with body mass index (BMI) of 30.0 to 39.9 12/25/2021   Right knee pain 12/25/2021   Septic infrapatellar bursitis of right knee 12/25/2021   Class 1 obesity without serious comorbidity with body mass index (BMI) of 30.0 to 30.9 in adult 10/02/2021   Encounter for long-term opiate analgesic use 06/04/2021   Chronic radicular lumbar pain 04/07/2021   Lumbar facet arthropathy 04/07/2021  Lumbar degenerative disc disease 04/07/2021   Spinal stenosis, lumbar region, with neurogenic claudication 11/06/2020   Chronic left shoulder pain 11/06/2020   Chronic pain syndrome 11/06/2020   Pain management contract signed 11/06/2020   CPAP use counseling 08/25/2020   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   Adrenal nodule (HCC) 10/28/2015   Sepsis (HCC) 07/19/2015   Essential hypertension 07/19/2015   Diabetes mellitus (HCC) 07/19/2015   Hypothyroidism 07/19/2015   Chronic back pain 07/19/2015   CKD (chronic kidney disease) stage 3, GFR 30-59 ml/min (HCC) 07/19/2015   Enlarged prostate 03/25/2015   Urine stream spraying  03/25/2015   BPH with obstruction/lower urinary tract symptoms 09/18/2014   Other male erectile dysfunction 09/18/2014   Sleep apnea 04/04/2014   PCP:  Lenell Query, PA-C Pharmacy:   [facility] [address]), [place] - [address] [place]) [place] [address] Phone: [phone] Fax: [phone]  [facility] - [place], [place] - [address] [place] [address] Phone: [phone] Fax: [phone]  [facility] - [place], [place] - [address] [place] [address] Phone: [phone] Fax: [phone]  [facility] - [facility] [address] [place] [address] Phone: [phone] Fax: [phone]  [facility] - [facility] [address] [place] [address] Phone: [phone] Fax: [phone]     Social Drivers of Health (SDOH) Social History: SDOH Screenings   Food Insecurity: No Food Insecurity (06/23/2023)  Housing: Unknown (06/23/2023)  Transportation Needs: No Transportation Needs (06/23/2023)  Utilities: Not At Risk (06/23/2023)  Alcohol Screen: Low Risk  (01/22/2022)  Depression (PHQ2-9): Low Risk  (06/09/2023)  Financial Resource Strain: Patient Declined (04/26/2023)   Received from [facility]  Recent Concern: Financial Resource Strain - Medium Risk (02/24/2023)   Received from [facility]  Physical Activity: Unknown (07/26/2022)  Social Connections: Socially Integrated (06/23/2023)  Stress: Stress Concern Present (07/26/2022)  Tobacco Use: Medium Risk (06/23/2023)   Received from [facility]   SDOH Interventions:     Readmission Risk Interventions    10/01/2022    9:51 AM 08/29/2022   10:57 AM  Readmission Risk Prevention Plan  Transportation Screening Complete Complete  PCP or Specialist Appt  within 3-5 Days Complete   HRI or Home Care Consult Patient refused   Social Work Consult for Recovery Care Planning/Counseling Complete   Palliative Care Screening Not Applicable   Medication Review [employer]) Complete Complete  PCP or Specialist appointment within 3-5 days of discharge  Complete  HRI or Home Care Consult  Complete  SW Recovery Care/Counseling Consult  Complete  Palliative Care Screening  Not Applicable  Skilled Nursing Facility  Complete

## 2023-06-24 NOTE — Progress Notes (Signed)
 K+ 2.9. MD made aware.

## 2023-06-24 NOTE — Consult Note (Signed)
 Subjective:   78 year old male with history of diabetes, chronic kidney disease, CLL, hypertension admitted 06/23/2023 for right knee pain, cellulitis.  Several days ago, patient states he was working on some plumbing under a house, denies any trauma or injury.  Had no pain initially after but yesterday developed right anterior knee pain swelling and warmth.  Patient was admitted and placed on IV antibiotics.  He has seen some improvement in overall pain and swelling along the right knee.   Objective: Vital signs in last 24 hours: Temp:  [98.6 F (37 C)-100.9 F (38.3 C)] 98.6 F (37 C) (05/30 0731) Pulse Rate:  [62-74] 66 (05/30 0731) Resp:  [16-18] 17 (05/30 0731) BP: (143-156)/(74-84) 143/78 (05/30 0731) SpO2:  [95 %-100 %] 97 % (05/30 0731) Weight:  [98.4 kg-101.5 kg] 98.4 kg (05/30 0424)  Intake/Output from previous day: 05/29 0701 - 05/30 0700 In: 860 [P.O.:360; IV Piggyback:500] Out: 750 [Urine:750] Intake/Output this shift: No intake/output data recorded.  Recent Labs    06/23/23 1741 06/24/23 0446  HGB 9.9*  9.8* 8.7*   Recent Labs    06/23/23 1741 06/24/23 0446  WBC 10.5  10.3 9.6  RBC 3.40*  3.40* 3.03*  HCT 31.2*  31.1* 27.4*  PLT 160  155 140*   Recent Labs    06/23/23 1741 06/24/23 0446  NA 137 135  K 3.6 2.9*  CL 109 108  CO2 22 21*  BUN 14 14  CREATININE 1.24 1.20  GLUCOSE 113* 197*  CALCIUM  8.4* 7.9*   Recent Labs    06/23/23 2000  INR 1.3*    EXAM General - Patient is Alert, Appropriate, and Oriented Right lower extremity -mild swelling throughout the right lower leg.  Negative Homans' sign.  He is tender along the prepatellar and infrapatellar regions of the right anterior knee with warmth present, no redness and no open wounds or drainage.  He has chronic appearing thickened skin along the infrapatellar region with mild superficial fluctuance consistent with mild infrapatellar fluid collection.  He is able to maintain active knee  extension.  No obvious joint effusion on exam.  Neurovascular intact in right lower extremity  Past Medical History:  Diagnosis Date   Adenoma of left adrenal gland    Anemia    Aortic atherosclerosis (HCC)    Blood transfusion without reported diagnosis    BPH (benign prostatic hyperplasia)    Bradycardia    CAD (coronary artery disease) 08/26/2022   a.) cCTA 08/26/2022: Ca2+ 14.3 (12th %'ile for age/sex.race match control); (<25%) pLAD   Cellulitis of left thigh 08/2022   Chronic pain syndrome    a.) on COT managed by pain management   Chronic, continuous use of opioids    a.) chronic pain syndrome/chronic back pain; managed by pain management   CKD (chronic kidney disease) stage 3, GFR 30-59 ml/min (HCC)    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   a.) Rai stage III   DDD (degenerative disc disease), cervical    Difficult airway 10/22/2015   a.) 1st attempt with glidescope  --> macroglossic (unsuccessful); b.) 2nd attempt with Mac 4 and direct laryngoscopy with a 8.0 ETT --> tube too large to pass through the cords; b.) 3rd attempt (successful) with a Mac 4 and a 7.5 tube   Diverticulosis    DM (diabetes mellitus), type 2 (HCC)    Dysplasia of prostate    Erectile dysfunction    Frequent falls    Heart failure with mildly reduced ejection  fraction (HFmrEF) (HCC)    a.) TTE 10/23/2015: EF 60-65%, mod-sev LVH, mild biatrial dil, degen MV disease, AoV sclerosis, asc Ao 38 mm, G1DD; b.) TTE 08/05/2022: EF 45-50%, mod-sev LVH (speckled pattern), sev biatrial dil, mild MR, AoV sclerosis, G1DD   Hepatic flexure mass 08/23/2022   a.) colonoscopy 08/23/2022: 14 x 15 mm partially obstructing ulcerated distal ascending colon mass; tissue friable --> pathology resulted as tubular adenoma, however felt to be false (-) --> referral to sugery and colectomy recommended; b.) s/p RIGHT hemicolectomy 09/28/2022   History of MRSA infection 08/28/2022   a.) MRSA PCR (+) 08/28/2022; culture  from LEFT thigh abscess   HTN (hypertension)    Hyperlipidemia    Hypogonadism in male    Hypothyroidism    IDA (iron  deficiency anemia)    LBBB (left bundle branch block)    Long-term use of aspirin  therapy    Lumbar spinal stenosis    Mild cardiomegaly    Multiple lung nodules on CT    NICM (nonischemic cardiomyopathy) (HCC)    a.) TTE 10/23/2015: EF 60-65; b.) TTE 08/05/2022: EF 45-50%   Orchitis and epididymitis 06/16/2013   OSA on CPAP    Palindromic rheumatism, hand    Pericardial effusion    Prostatitis    Sepsis (HCC) 08/2022   Subdural hematoma (HCC) 10/22/2015   a.) s/p traumatic mechanical fall --> CT head 10/22/2015: high-density SDH the left cerebral convexity with13 mm midline shift --> s/p LEFT frontal burr hole craniotomy   Tubular adenoma of colon    Ventral hernia     Assessment/Plan:      Principal Problem:   Right knee pain Active Problems:   Essential hypertension   Hypothyroidism   HLD (hyperlipidemia)   CLL (chronic lymphocytic leukemia) (HCC)   IDA (iron  deficiency anemia)   Septic prepatellar bursitis of right knee   Chronic systolic CHF (congestive heart failure) (HCC)   COPD (chronic obstructive pulmonary disease) (HCC)   Chronic kidney disease, stage 3a (HCC)   Leg edema, right   Type II diabetes mellitus with renal manifestations (HCC)  Estimated body mass index is 28.62 kg/m as calculated from the following:   Height as of this encounter: 6\' 1"  (1.854 m).   Weight as of this encounter: 48.70 kg.  78 year old male with acute right knee pain.  X-rays negative for fracture.  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  Patient has a focal area of pain and tenderness with swelling and warmth along the infrapatellar bursa, likely a small fluid collection present along the infrapatellar bursa.  Do not suspect tendon rupture as he is able to maintain active knee extension. Due to thickened anterior skin along the  infrapatellar bursa would recommend ultrasound-guided aspiration for possible source of infection.  Patient also noted to have some lower extremity swelling, agree with venous ultrasound right lower extremity.    Cathi Cluster, PA-C [facility] 06/24/2023, 9:56 AM

## 2023-06-25 ENCOUNTER — Inpatient Hospital Stay

## 2023-06-25 DIAGNOSIS — R600 Localized edema: Secondary | ICD-10-CM

## 2023-06-25 DIAGNOSIS — N1831 Chronic kidney disease, stage 3a: Secondary | ICD-10-CM | POA: Diagnosis not present

## 2023-06-25 DIAGNOSIS — M25561 Pain in right knee: Secondary | ICD-10-CM | POA: Diagnosis not present

## 2023-06-25 DIAGNOSIS — M71161 Other infective bursitis, right knee: Secondary | ICD-10-CM | POA: Diagnosis not present

## 2023-06-25 LAB — BASIC METABOLIC PANEL WITH GFR
Anion gap: 4 — ABNORMAL LOW (ref 5–15)
BUN: 12 mg/dL (ref 8–23)
CO2: 25 mmol/L (ref 22–32)
Calcium: 7.9 mg/dL — ABNORMAL LOW (ref 8.9–10.3)
Chloride: 107 mmol/L (ref 98–111)
Creatinine, Ser: 1.07 mg/dL (ref 0.61–1.24)
GFR, Estimated: >60 mL/min (ref >60)
Glucose, Bld: 116 mg/dL — ABNORMAL HIGH (ref 70–99)
Potassium: 3.6 mmol/L (ref 3.5–5.1)
Sodium: 136 mmol/L (ref 135–145)

## 2023-06-25 LAB — GLUCOSE, CAPILLARY
Glucose-Capillary: 106 mg/dL — ABNORMAL HIGH (ref 70–99)
Glucose-Capillary: 144 mg/dL — ABNORMAL HIGH (ref 70–99)
Glucose-Capillary: 93 mg/dL (ref 70–99)
Glucose-Capillary: 162 mg/dL — ABNORMAL HIGH (ref 70–99)

## 2023-06-25 MED ORDER — METRONIDAZOLE 500 MG/100ML IV SOLN
500.0000 mg | Freq: Two times a day (BID) | INTRAVENOUS | Status: DC
  Administered 2023-06-25 – 2023-06-27 (×5): 500 mg via INTRAVENOUS
  Filled 2023-06-25 (×5): qty 100

## 2023-06-25 MED ORDER — SODIUM CHLORIDE 0.9 % IV SOLN
2.0000 g | Freq: Three times a day (TID) | INTRAVENOUS | Status: DC
  Administered 2023-06-25 – 2023-06-27 (×7): 2 g via INTRAVENOUS
  Filled 2023-06-25 (×8): qty 12.5

## 2023-06-25 NOTE — Progress Notes (Signed)
 Physical Therapy Evaluation Patient Details Name: Paul Pooley [name] MRN: [id number] DOB: 1945-07-25 Today's Date: 06/25/2023  History of Present Illness  Paul Bollig [name] is a 78 y.o. male with medical history significant of Chronic Lymphocytic Leukemia, HTN, HLD, DM, hypothyroidism, testicular mass, CKD-3, BPH, prostatitis, orchitis, epididymitis, SDH, OSA not on CPAP, chronic pain, who presents with right knee pain and swelling. He was admitted with septic prepatellar bursitis of right knee.   Clinical Impression  Patient received reclining in bed, alert and oriented, and able to provide detailed history. Patient lives with his wife who he states is mostly unwilling to help him or at least unreliable in her willingness to help him so he must do everything for himself including getting the mobility equipment he owns from the shed. Prior to hospitalization he was I with ADLs, IADLs, and mobility with no assistive device, although he struggled due to back and B shoulder pain. He drives, works as needed doing Gaffer type jobs, goes fishing frequently. Upon PT evaluation, patient insisted on hand held assistance to go supine to sit, but likely could have performed this mod I given enough time and need. He completed sit <> stand and ambulation 200 feet with a RW and supervision with cuing for correct body/AD placement. He had increased R knee pain and antalgic gait, with R knee giving way twice during ambulation, likely due to arthrogenic inhibition. Patient encouraged to use a RW at all times to improve safety while ambulating while his knee is not reliably stable. He appears to have experienced a significant decline in functional independence/mobility. Patient would benefit from skilled physical therapy to address impairments and functional limitations (see PT Problem List below) to work towards stated goals and return to PLOF or maximal functional independence.       If plan is discharge  home, recommend the following: Assistance with cooking/housework;Assist for transportation   Can travel by private vehicle    yes    Equipment Recommendations Rolling walker (2 wheels);BSC/3in1 (since patient does not have someone to get the ones he already owns from the shed)  Recommendations for Other Services       Functional Status Assessment Patient has had a recent decline in their functional status and demonstrates the ability to make significant improvements in function in a reasonable and predictable amount of time.     Precautions / Restrictions Precautions Precautions: Fall Restrictions Weight Bearing Restrictions Per Provider Order: No      Mobility  Bed Mobility Overal bed mobility: Needs Assistance Bed Mobility: Supine to Sit     Supine to sit: Min assist, HOB elevated     General bed mobility comments: Patient insisted on R UE support to go supine to sit but appeared strong enough to perform himself if given no choice    Transfers Overall transfer level: Modified independent Equipment used: Rolling walker (2 wheels)               General transfer comment: sit <> stand transfer bed to chair to RW, slow and painful but able to complete with AD.    Ambulation/Gait Ambulation/Gait assistance: Supervision Gait Distance (Feet): 200 Feet Assistive device: Rolling walker (2 wheels) Gait Pattern/deviations: Decreased stride length, Decreased stance time - right, Decreased weight shift to right, Knee flexed in stance - right, Antalgic, Trunk flexed Gait velocity: slow     General Gait Details: Patient ambulated around nursing station with RW and supervision with occasional cuing to keep walker close enough  to body. Pateint has difficulty standing fully straight and has antalgic gait with R knee flexed during ambulation. Had two instances of R knee giving way but able to remain steady with use of RW. R knee pain increased following ambulation.  Stairs             Wheelchair Mobility     Tilt Bed    Modified Rankin (Stroke Patients Only)       Balance Overall balance assessment: Needs assistance Sitting-balance support: No upper extremity supported Sitting balance-Leahy Scale: Normal     Standing balance support: During functional activity, Bilateral upper extremity supported, Reliant on assistive device for balance Standing balance-Leahy Scale: Fair Standing balance comment: Patient dependent on AD for balance during ambulation.                             Pertinent Vitals/Pain Pain Assessment Pain Assessment: 0-10 Pain Score: 5  Pain Location: R knee (also complains of low back pain and B shoulder pain) Pain Descriptors / Indicators: Discomfort Pain Intervention(s): Limited activity within patient's tolerance, Monitored during session, Repositioned    Home Living Family/patient expects to be discharged to:: Private residence Living Arrangements: Spouse/significant other Available Help at Discharge: Family;Available PRN/intermittently (pt reports wife only helps him when she feels like it and that they stay at opposite ends of the home. He states he has to do everything for himself.) Type of Home: House Home Access: Level entry       Home Layout: One level Home Equipment: Rollator (4 wheels);BSC/3in1;Rolling Walker (2 wheels);Cane - single point Additional Comments: Patient states his assistive devices are in the shed and there is no one available to go get them.    Prior Function Prior Level of Function : Independent/Modified Independent;History of Falls (last six months);Working/employed             Mobility Comments: Independent with mobility, reports no use of AD at home and that he works part time with handyman type of work. States he "falls all the time" because of his job. ADLs Comments: Modified independent/independent in ADL's. He states he needs help with things and his shoulders and back  hurts all the time, but he finds a way to perform the activities because his wife won't help him.     Extremity/Trunk Assessment   Upper Extremity Assessment Upper Extremity Assessment: Overall WFL for tasks assessed    Lower Extremity Assessment Lower Extremity Assessment: RLE deficits/detail RLE Deficits / Details: decreased ROM with empty end feel, painful during motion. RLE: Unable to fully assess due to pain    Cervical / Trunk Assessment Cervical / Trunk Assessment:  (stooped in standing, able to stand up straighter with effort)  Communication   Communication Communication: No apparent difficulties Factors Affecting Communication: Hearing impaired    Cognition Arousal: Alert Behavior During Therapy: WFL for tasks assessed/performed   PT - Cognitive impairments: No apparent impairments                         Following commands: Intact       Cueing Cueing Techniques: Verbal cues, Gestural cues     General Comments General comments (skin integrity, edema, etc.): R knee warm to touch    Exercises Other Exercises Other Exercises: Pt educated on role of PT in acute care setting, discharge reccomendations including constant use of RW until knee stops buckling, safe use of AD for  mobility.   Assessment/Plan    PT Assessment Patient needs continued PT services  PT Problem List Decreased strength;Decreased range of motion;Decreased activity tolerance;Decreased balance;Decreased knowledge of use of DME;Pain;Decreased mobility       PT Treatment Interventions DME instruction;Therapeutic exercise;Gait training;Balance training;Stair training;Neuromuscular re-education;Functional mobility training;Therapeutic activities;Patient/family education    PT Goals (Current goals can be found in the Care Plan section)  Acute Rehab PT Goals Patient Stated Goal: get back to fishing PT Goal Formulation: With patient Time For Goal Achievement: 07/09/23 Potential to  Achieve Goals: Good    Frequency Min 2X/week     Co-evaluation               AM-PAC PT "6 Clicks" Mobility  Outcome Measure Help needed turning from your back to your side while in a flat bed without using bedrails?: A Little Help needed moving from lying on your back to sitting on the side of a flat bed without using bedrails?: A Little Help needed moving to and from a bed to a chair (including a wheelchair)?: None Help needed standing up from a chair using your arms (e.g., wheelchair or bedside chair)?: None Help needed to walk in hospital room?: A Little Help needed climbing 3-5 steps with a railing? : A Lot 6 Click Score: 19    End of Session   Activity Tolerance: Patient tolerated treatment well;Patient limited by pain Patient left: in chair;with call bell/phone within reach;with chair alarm set Nurse Communication: Mobility status PT Visit Diagnosis: Other abnormalities of gait and mobility (R26.89);History of falling (Z91.81);Pain;Muscle weakness (generalized) (M62.81) Pain - Right/Left: Right Pain - part of body: Knee    Time: 3086-5784 PT Time Calculation (min) (ACUTE ONLY): 31 min   Charges:   PT Evaluation $PT Eval Low Complexity: 1 Low PT Treatments $Gait Training: 8-22 mins PT General Charges $$ ACUTE PT VISIT: 1 Visit        Abraham Hoffmann R. Artemio Larry, PT, DPT 06/25/23, 4:01 PM

## 2023-06-25 NOTE — Plan of Care (Signed)
  Problem: Education: Goal: Ability to describe self-care measures that may prevent or decrease complications (Diabetes Survival Skills Education) will improve Outcome: Progressing   Problem: Coping: Goal: Ability to adjust to condition or change in health will improve Outcome: Progressing   Problem: Activity: Goal: Risk for activity intolerance will decrease Outcome: Progressing   

## 2023-06-25 NOTE — Progress Notes (Addendum)
 Progress Note   Patient: Paul Denbleyker [name] WUJ:[id number] DOB: [birth year] DOA: 06/23/2023     2 DOS: the patient was seen and examined on 06/25/2023   Brief hospital course:  "Paul Alicea [name] is a 78 y.o. male with medical history significant of CLL, HTN, HLD, DM, hypothyroidism, testicular mass, CKD-3, BPH, prostatitis, orchitis, epididymitis, SDH, OSA not on CPAP, chronic pain, who presents with right knee pain and swelling..." See H&P for full HPI on admission & ED course.  Patient was admitted and started on IV antibiotics for septic pre-patellar bursitis of the right knee.  Orthopedic surgery was consulted but did not identify any drain-able fluid collection.   Further hospital course and management as outlined below.    Assessment and Plan:  Right knee pain due to septic prepatellar bursitis of right knee: Patient has fever 100.9, but does not meet criteria for sepsis (WBC 10.5, temperature 100.9, heart rate 74, RR 18, lactic acid 1.2). uric acid normal 5.3. EDP has reviewed with Dr. Clyda Dark of orthopedics, who agrees with plan for IV antibiotics, no focal fluctuance noted at this time that would be amenable to drainage.   --Continue empiric IV vancomycin  and Rocephin  --Appreciate Orthopedic surgery input --Ultrasound ordered to assess if deeper drain-able fluid collection, per IR - want diagnostic U/S before assess for aspiration and drainage --PRN Zofran  for nausea, and Tylenol , Percocet for pain --Follow blood cultures and inflammatory markers --PT evaluation  Hypokalemia - K 2.9 on 5/30 was replaced. --BMP in AM  Leg edema, right --Venous duplex U/S negative for DVT    CLL (chronic lymphocytic leukemia) (HCC): Patient is not taking Brukinsa  currently --F/u with Dr. Wilhelmenia Harada of oncology   Chronic systolic CHF (congestive heart failure) (HCC): 2D echo on 08/05/2022 showed EF of 45-50% with grade 1 diastolic dysfunction.  Patient does not have SOB, but has elevated  BNP 2392.  Patient is high risk of developing CHF exacerbation.  Patient is not taking Lasix  and spironolactone  currently. --On Entresto  --Dc'd Lasix  and spironolactone  per patient   Essential hypertension --Entresto  --Hold Lasix , spironolactone  -- dc'd orders as pt reports recently being told not to take these, he declines them --IV hydralazine  as needed   COPD (chronic obstructive pulmonary disease) (HCC): Stable --Bronchodilators and as needed Mucinex    Chronic kidney disease, stage 3a (HCC): Renal function stable.  Recent baseline creatinine 1.61 on 05/25/2023.  His creatinine is 1.24, BUN 14, GFR 60. --Follow CBC   HLD (hyperlipidemia) --Crestor    Hypothyroidism --Synthroid    IDA (iron  deficiency anemia): Hemoglobin stab2le 9.9 (9.4 on 06/01/2023) --Follow-up with CBC   Type II diabetes mellitus with renal manifestations [facility]): Recent A1c 5.9, well-controlled.  Pt home regimen is Jardiance , metformin , NFAOZH --Sliding scale Novolog       Subjective: Pt awake resting in bed this AM. He reports persistent right knee pain, feels more tightness and swelling today.  Reports being hot and cold overnight and earlier this AM. Denies other complaints.   Physical Exam: Vitals:   06/24/23 1958 06/25/23 0401 06/25/23 0716 06/25/23 0813  BP: (!) 157/75 (!) 142/75 (!) 164/69   Pulse: 71 61 (!) 58 (!) 58  Resp: 16 15 16    Temp: (!) 100.7 F (38.2 C) 99 F (37.2 C) 98.3 F (36.8 C)   TempSrc: Oral Oral    SpO2: 99% 100% 97%   Weight:  98.8 kg    Height:       General exam: awake, alert, no acute distress HEENT: moist mucus  membranes, hearing grossly normal  Respiratory system: on room air, normal respiratory effort. Cardiovascular system: RRR,  no pedal edema.   Gastrointestinal system: soft, NT, ND Central nervous system: A&O x 3. no gross focal neurologic deficits, normal speech Extremities: right knee has increased swelling inferior to the patella, persistent warmth and  induration over the patella Psychiatry: normal mood, congruent affect, judgement and insight appear normal   Data Reviewed:  Notable labs --   K 2.9 >> 3.6  Glucose 116 Ca 7.9   Last CBC Hbg stable 8.7, platelets 140   Family Communication: None present. Pt updated in detail.  Disposition: Status is: Inpatient Remains inpatient appropriate because: remains on IV antibiotics   Planned Discharge Destination: Home    Time spent: 38 minutes  Author: Montey Apa, DO 06/25/2023 12:20 PM  For on call review [email or web address].

## 2023-06-25 NOTE — Progress Notes (Signed)
 PT Cancellation Note  Patient Details Name: Jeanette Moffatt [name] MRN: [id number] DOB: [birth year]   Cancelled Treatment:    Reason Eval/Treat Not Completed: Patient at procedure or test/unavailable (attempted PT evaluation but patient was off floor. Will re-attempt at later time/date as appropriate.)   Carilyn Charles. Artemio Larry, PT, DPT 06/25/23, 1:26 PM

## 2023-06-26 DIAGNOSIS — M25561 Pain in right knee: Secondary | ICD-10-CM | POA: Diagnosis not present

## 2023-06-26 DIAGNOSIS — M71161 Other infective bursitis, right knee: Secondary | ICD-10-CM | POA: Diagnosis not present

## 2023-06-26 LAB — CBC
HCT: 30 % — ABNORMAL LOW (ref 39.0–52.0)
Hemoglobin: 9.5 g/dL — ABNORMAL LOW (ref 13.0–17.0)
MCH: 29 pg (ref 26.0–34.0)
MCHC: 31.7 g/dL (ref 30.0–36.0)
MCV: 91.5 fL (ref 80.0–100.0)
Platelets: 171 10*3/uL (ref 150–400)
RBC: 3.28 MIL/uL — ABNORMAL LOW (ref 4.22–5.81)
RDW: 20.4 % — ABNORMAL HIGH (ref 11.5–15.5)
WBC: 7 10*3/uL (ref 4.0–10.5)
nRBC: 0 % (ref 0.0–0.2)

## 2023-06-26 LAB — MAGNESIUM: Magnesium: 2.1 mg/dL (ref 1.7–2.4)

## 2023-06-26 LAB — BASIC METABOLIC PANEL WITH GFR
Anion gap: 6 (ref 5–15)
BUN: 16 mg/dL (ref 8–23)
CO2: 23 mmol/L (ref 22–32)
Calcium: 8 mg/dL — ABNORMAL LOW (ref 8.9–10.3)
Chloride: 109 mmol/L (ref 98–111)
Creatinine, Ser: 1.01 mg/dL (ref 0.61–1.24)
GFR, Estimated: >60 mL/min (ref >60)
Glucose, Bld: 112 mg/dL — ABNORMAL HIGH (ref 70–99)
Potassium: 3.9 mmol/L (ref 3.5–5.1)
Sodium: 138 mmol/L (ref 135–145)

## 2023-06-26 LAB — GLUCOSE, CAPILLARY
Glucose-Capillary: 95 mg/dL (ref 70–99)
Glucose-Capillary: 152 mg/dL — ABNORMAL HIGH (ref 70–99)
Glucose-Capillary: 161 mg/dL — ABNORMAL HIGH (ref 70–99)

## 2023-06-26 MED ORDER — VANCOMYCIN HCL IN DEXTROSE 1-5 GM/200ML-% IV SOLN
1000.0000 mg | Freq: Two times a day (BID) | INTRAVENOUS | Status: DC
  Administered 2023-06-26 – 2023-06-28 (×4): 1000 mg via INTRAVENOUS
  Filled 2023-06-26 (×5): qty 200

## 2023-06-26 NOTE — Plan of Care (Signed)
  Problem: Fluid Volume: Goal: Ability to maintain a balanced intake and output will improve Outcome: Progressing   Problem: Nutritional: Goal: Maintenance of adequate nutrition will improve Outcome: Progressing   Problem: Activity: Goal: Risk for activity intolerance will decrease Outcome: Progressing   Problem: Pain Managment: Goal: General experience of comfort will improve and/or be controlled Outcome: Progressing

## 2023-06-26 NOTE — Progress Notes (Addendum)
 Progress Note   Patient: Paul Briggs. JJO:[id number] DOB: 1945/02/25 DOA: 06/23/2023     3 DOS: the patient was seen and examined on 06/26/2023   Brief hospital course:  "Mathius Birkeland Briggs. is a 78 y.o. male with medical history significant of CLL, HTN, HLD, DM, hypothyroidism, testicular mass, CKD-3, BPH, prostatitis, orchitis, epididymitis, SDH, OSA not on CPAP, chronic pain, who presents with right knee pain and swelling..." See H&P for full HPI on admission & ED course.  Patient was admitted and started on IV antibiotics for septic pre-patellar bursitis of the right knee.  Orthopedic surgery was consulted but did not identify any drain-able fluid collection.   Further hospital course and management as outlined below.    Assessment and Plan:  Right knee pain due to septic prepatellar bursitis of right knee: Patient has fever 100.9, but does not meet criteria for sepsis (WBC 10.5, temperature 100.9, heart rate 74, RR 18, lactic acid 1.2). uric acid normal 5.3. EDP has reviewed with Dr. Clyda Dark of orthopedics, who agrees with plan for IV antibiotics, no focal fluctuance noted at this time that would be amenable to drainage.   --Continue empiric IV vancomycin  and Rocephin  --Appreciate Orthopedic surgery input --Ortho has ordered for FNA aspiration and culture with IR - pending --PRN Zofran  for nausea, and Tylenol , Percocet for pain --Follow blood cultures and inflammatory markers --PT evaluation  Hypokalemia - K 2.9 on 5/30 was replaced. --BMP in AM  Leg edema, right --Venous duplex U/S negative for DVT    CLL (chronic lymphocytic leukemia) (HCC): Patient is not taking Brukinsa  currently --F/u with Dr. Wilhelmenia Harada of oncology   Chronic systolic CHF (congestive heart failure) (HCC): 2D echo on 08/05/2022 showed EF of 45-50% with grade 1 diastolic dysfunction.  Patient does not have SOB, but has elevated BNP 2392.  Patient is high risk of developing CHF exacerbation.  Patient  is not taking Lasix  and spironolactone  currently. --On Entresto  --Dc'd Lasix  and spironolactone  per patient   Essential hypertension --Entresto  --Hold Lasix , spironolactone  -- dc'd orders as pt reports recently being told not to take these, he declines them --IV hydralazine  as needed   COPD (chronic obstructive pulmonary disease) (HCC): Stable --Bronchodilators and as needed Mucinex    Chronic kidney disease, stage 3a (HCC): Renal function stable.  Recent baseline creatinine 1.61 on 05/25/2023.  His creatinine is 1.24, BUN 14, GFR 60. --Follow CBC   HLD (hyperlipidemia) --Crestor    Hypothyroidism --Synthroid    IDA (iron  deficiency anemia): Hemoglobin stab2le 9.9 (9.4 on 06/01/2023) --Follow-up with CBC   Type II diabetes mellitus with renal manifestations [facility]): Recent A1c 5.9, well-controlled.  Pt home regimen is Jardiance , metformin , Wegovy --Sliding scale Novolog       Subjective: Pt awake resting in bed this AM. No fever chills.  Right knee pain is still present but has improved some since admission.  Pt denies other acute complaints.   Physical Exam: Vitals:   06/25/23 2015 06/26/23 0451 06/26/23 0500 06/26/23 0800  BP: 136/79 (!) 159/96  (!) 144/81  Pulse: 74 68  (!) 46  Resp: 18 18  16   Temp: 98.8 F (37.1 C) 98.3 F (36.8 C)  98.1 F (36.7 C)  TempSrc: Oral   Oral  SpO2: 98% 99%  99%  Weight:   98.3 kg   Height:       General exam: awake, alert, no acute distress HEENT: moist mucus membranes, hearing grossly normal  Respiratory system: on room air, normal respiratory effort. Cardiovascular system: RRR,  no pedal edema.   Gastrointestinal system: soft, NT, ND Central nervous system: A&O x 3. no gross focal neurologic deficits, normal speech Extremities: right knee has increased swelling inferior to the patella, persistent warmth and induration over the patella Psychiatry: normal mood, congruent affect, judgement and insight appear normal   Data  Reviewed:  Notable labs --   Normal BMP except glucose 112, Ca 8.0 Hbg 9.5  Soft Tissue U/S right knee yesterday -- focal fluid collection in the anterior and lateral soft tissues of right knee just above the patella.   Family Communication: None present. Pt updated in detail.  Disposition: Status is: Inpatient Remains inpatient appropriate because: remains on IV antibiotics   Planned Discharge Destination: Home    Time spent: 38 minutes  Author: Montey Apa, DO 06/26/2023 11:59 AM  For on call review [email or web address].

## 2023-06-26 NOTE — Progress Notes (Signed)
 Pharmacy Antibiotic Note  Paul Briggs. is a 78 y.o. male admitted on 06/23/2023 with septic prepatellar bursitis of right knee.  Pharmacy has been consulted for Vancomycin  dosing.  Plan: Renal function improving, will adjust dose to: Vancomycin  1000 mg IV Q 12 hrs. Goal AUC 400-550. Expected AUC: 456.9 SCr used: 1.01, Vd used: 0.72  Pharmacy will continue to follow and will adjust abx dosing whenever warranted.  Temp (24hrs), Avg:98.4 F (36.9 C), Min:98.1 F (36.7 C), Max:98.8 F (37.1 C)   Recent Labs  Lab 06/23/23 1741 06/24/23 0446 06/25/23 0948 06/26/23 0507  WBC 10.5  10.3 9.6  --  7.0  CREATININE 1.24 1.20 1.07 1.01  LATICACIDVEN 1.2  --   --   --     Estimated Creatinine Clearance: 75.6 mL/min (by C-G formula based on SCr of 1.01 mg/dL).    No Known Allergies  Antimicrobials this admission: 5/29 Ceftriaxone  >>  5/29 Vancomycin  >>  Microbiology results: 5/29 BCx: NGTD  Thank you for allowing pharmacy to be a part of this patient's care.  Kaniyah Lisby A Emmily Pellegrin, PharmD Clinical Pharmacist 06/26/2023 8:31 AM

## 2023-06-26 NOTE — TOC Progression Note (Signed)
 Transition of Care (TOC) - Progression Note    Patient Details  Name: Paul Arreola [name] MRN: [id number] Date of Birth: 03/17/1945  Transition of Care [facility]) CM/SW Contact  Alexandra Ice, RN Phone Number: 06/26/2023, 3:23 PM  Clinical Narrative:      Met with patient to discuss discharge plan, home with home health services and discuss DME needs. He state he just recently received DME equipment. TOC explained that if he just received DME in the past 5-years unable to get again as it is too soon. Asked if TOC can call spouse to see she can assist in getting the DME out of shed for him. He stated no as she is handicapped herself and would be unable to get them. He stated there is no one else to assist or for TOC to reach out to ask for assistance in getting the equipment out for him. Discussed home health, he declines stated he will be fine, notified him once discharged he will need to set up home health by calling PCP. He verbalized understanding. TOC will continue to follow for any additional needs.      Expected Discharge Plan and Services                                               Social Determinants of Health (SDOH) Interventions SDOH Screenings   Food Insecurity: No Food Insecurity (06/23/2023)  Housing: Unknown (06/23/2023)  Transportation Needs: No Transportation Needs (06/23/2023)  Utilities: Not At Risk (06/23/2023)  Alcohol Screen: Low Risk  (01/22/2022)  Depression (PHQ2-9): Low Risk  (06/09/2023)  Financial Resource Strain: Patient Declined (04/26/2023)   Received from [facility]  Recent Concern: Financial Resource Strain - Medium Risk (02/24/2023)   Received from [facility]  Physical Activity: Unknown (07/26/2022)  Social Connections: Socially Integrated (06/23/2023)  Stress: Stress Concern Present (07/26/2022)  Tobacco Use: Medium Risk (06/23/2023)   Received from [facility]    Readmission  Risk Interventions    10/01/2022    9:51 AM 08/29/2022   10:57 AM  Readmission Risk Prevention Plan  Transportation Screening Complete Complete  PCP or Specialist Appt within 3-5 Days Complete   HRI or Home Care Consult Patient refused   Social Work Consult for Recovery Care Planning/Counseling Complete   Palliative Care Screening Not Applicable   Medication Review Oceanographer) Complete Complete  PCP or Specialist appointment within 3-5 days of discharge  Complete  HRI or Home Care Consult  Complete  SW Recovery Care/Counseling Consult  Complete  Palliative Care Screening  Not Applicable  Skilled Nursing Facility  Complete

## 2023-06-27 ENCOUNTER — Inpatient Hospital Stay: Admitting: Radiology

## 2023-06-27 ENCOUNTER — Other Ambulatory Visit: Payer: Self-pay

## 2023-06-27 DIAGNOSIS — N1831 Chronic kidney disease, stage 3a: Secondary | ICD-10-CM | POA: Diagnosis not present

## 2023-06-27 DIAGNOSIS — M71161 Other infective bursitis, right knee: Secondary | ICD-10-CM | POA: Diagnosis not present

## 2023-06-27 HISTORY — PX: IR US GUIDE BX ASP/DRAIN: IMG2392

## 2023-06-27 LAB — GLUCOSE, CAPILLARY
Glucose-Capillary: 101 mg/dL — ABNORMAL HIGH (ref 70–99)
Glucose-Capillary: 113 mg/dL — ABNORMAL HIGH (ref 70–99)
Glucose-Capillary: 104 mg/dL — ABNORMAL HIGH (ref 70–99)
Glucose-Capillary: 128 mg/dL — ABNORMAL HIGH (ref 70–99)

## 2023-06-27 LAB — BASIC METABOLIC PANEL WITH GFR
Anion gap: 6 (ref 5–15)
BUN: 12 mg/dL (ref 8–23)
CO2: 22 mmol/L (ref 22–32)
Calcium: 8 mg/dL — ABNORMAL LOW (ref 8.9–10.3)
Chloride: 110 mmol/L (ref 98–111)
Creatinine, Ser: 0.99 mg/dL (ref 0.61–1.24)
GFR, Estimated: >60 mL/min (ref >60)
Glucose, Bld: 121 mg/dL — ABNORMAL HIGH (ref 70–99)
Potassium: 3.5 mmol/L (ref 3.5–5.1)
Sodium: 138 mmol/L (ref 135–145)

## 2023-06-27 LAB — SYNOVIAL CELL COUNT + DIFF, W/ CRYSTALS
Crystals, Fluid: NONE SEEN
Eosinophils-Synovial: 1 %
Lymphocytes-Synovial Fld: 4 %
Monocyte-Macrophage-Synovial Fluid: 2 %
Neutrophil, Synovial: 93 %
WBC, Synovial: 73700 /mm3 — ABNORMAL HIGH (ref 0–200)

## 2023-06-27 MED ORDER — LIDOCAINE 1 % OPTIME INJ - NO CHARGE
5.0000 mL | Freq: Once | INTRAMUSCULAR | Status: AC
  Administered 2023-06-27: 5 mL

## 2023-06-27 MED ORDER — SODIUM CHLORIDE 0.9 % IV SOLN
2.0000 g | Freq: Every day | INTRAVENOUS | Status: DC
  Administered 2023-06-27: 2 g via INTRAVENOUS
  Filled 2023-06-27 (×2): qty 20

## 2023-06-27 NOTE — Plan of Care (Signed)
  Problem: Metabolic: Goal: Ability to maintain appropriate glucose levels will improve Outcome: Progressing   Problem: Nutritional: Goal: Maintenance of adequate nutrition will improve Outcome: Progressing   Problem: Tissue Perfusion: Goal: Adequacy of tissue perfusion will improve Outcome: Progressing   Problem: Skin Integrity: Goal: Risk for impaired skin integrity will decrease Outcome: Progressing

## 2023-06-27 NOTE — Procedures (Signed)
 Interventional Radiology Procedure Note  Procedure: US  LATERAL RT KNEE ASPIRATION    Complications: None  Estimated Blood Loss:  0  Findings: 3CC BLOODY EXUDATIVE FLD ASPIRATED CX SENT     Dayne Even, MD

## 2023-06-27 NOTE — Progress Notes (Signed)
 Progress Note   Patient: Paul Bankhead [name] WJX:[id number] DOB: 1945-09-15 DOA: 06/23/2023     4 DOS: the patient was seen and examined on 06/27/2023   Brief hospital course:  "Paul Benavides [name] is a 78 y.o. male with medical history significant of CLL, HTN, HLD, DM, hypothyroidism, testicular mass, CKD-3, BPH, prostatitis, orchitis, epididymitis, SDH, OSA not on CPAP, chronic pain, who presents with right knee pain and swelling..." See H&P for full HPI on admission & ED course.  Patient was admitted and started on IV antibiotics for septic pre-patellar bursitis of the right knee.  Orthopedic surgery was consulted but did not identify any drain-able fluid collection.   Further hospital course and management as outlined below.    Assessment and Plan:  Right knee pain due to septic prepatellar bursitis of right knee: Patient has fever 100.9, but does not meet criteria for sepsis (WBC 10.5, temperature 100.9, heart rate 74, RR 18, lactic acid 1.2). uric acid normal 5.3. EDP has reviewed with Dr. Clyda Briggs of orthopedics, who agrees with plan for IV antibiotics, no focal fluctuance noted at this time that would be amenable to drainage.   --Continue empiric IV vancomycin  and Rocephin  --Appreciate Orthopedic surgery input --Ortho has ordered for FNA aspiration and culture with IR - pending --Follow cultures when obtained today (6/2) --PRN Zofran  for nausea, and Tylenol , Percocet for pain --Follow blood cultures and inflammatory markers --PT evaluation  Hypokalemia - K 2.9 on 5/30 was replaced. --BMP in AM  Leg edema, right Venous duplex U/S negative for DVT  --Mgmt as above   CLL (chronic lymphocytic leukemia) (HCC): Patient is not taking Brukinsa  currently --F/u with Dr. Wilhelmenia Briggs of oncology   Chronic systolic CHF (congestive heart failure) (HCC): 2D echo on 08/05/2022 showed EF of 45-50% with grade 1 diastolic dysfunction.  Patient does not have SOB, but has elevated BNP 2392.   Patient is high risk of developing CHF exacerbation.  Patient is not taking Lasix  and spironolactone  currently. --On Entresto  --Dc'd Lasix  and spironolactone  per patient   Essential hypertension --Entresto  --Hold Lasix , spironolactone  -- dc'd orders as pt reports recently being told not to take these, he declines them --IV hydralazine  as needed   COPD (chronic obstructive pulmonary disease) (HCC): Stable --Bronchodilators and as needed Mucinex    Chronic kidney disease, stage 3a (HCC): Renal function stable.  Recent baseline creatinine 1.61 on 05/25/2023.  His creatinine is 1.24, BUN 14, GFR 60. --Follow CBC   HLD (hyperlipidemia) --Crestor    Hypothyroidism --Synthroid    IDA (iron  deficiency anemia): Hemoglobin stab2le 9.9 (9.4 on 06/01/2023) --Follow-up with CBC   Type II diabetes mellitus with renal manifestations [facility]): Recent A1c 5.9, well-controlled.  Pt home regimen is Jardiance , metformin , Wegovy --Sliding scale Novolog       Subjective: Pt sleeping comfortably when seen this AM. Wakes to voice briefly, denies F/C uncontrolled pain or other complaints.   Physical Exam: Vitals:   06/26/23 2104 06/27/23 0500 06/27/23 0503 06/27/23 0811  BP: (!) 142/77  (!) 147/75 (!) 141/86  Pulse: 65  64 66  Resp: 18  18 16   Temp: 98.1 F (36.7 C)  98.2 F (36.8 C) 98.3 F (36.8 C)  TempSrc: Oral  Oral   SpO2: 100%  99% 99%  Weight:  101.3 kg    Height:       General exam: sleeping comfortably, wakes to voice, no acute distress HEENT: moist mucus membranes, hearing grossly normal  Respiratory system: on room air, normal respiratory effort. Cardiovascular system:  RRR,  no pedal edema.   Gastrointestinal system: soft, NT, ND Central nervous system: A&O x 3. no gross focal neurologic deficits, normal speech Extremities: right knee has stable swelling inferior to the patella, persistent warmth and induration without significant change since yesterday, 1= RLE edema, no LLE  edema Psychiatry: normal mood, congruent affect, judgement and insight appear normal   Data Reviewed:  Notable labs --   Normal BMP except glucose 121, Ca 8.0  Last Hbg 9.5 on 6/1 stable  Micro --  Right knee fluid culture -- pending  Blood cultures x 2 - no growth to date   Family Communication: None present. Pt updated in detail.  Disposition: Status is: Inpatient Remains inpatient appropriate because: remains on IV antibiotics pending culture results    Planned Discharge Destination: Home    Time spent: 36 minutes  Author: Montey Apa, DO 06/27/2023 12:16 PM  For on call review [email or web address].

## 2023-06-27 NOTE — Progress Notes (Signed)
 Mobility Specialist - Progress Note   06/27/23 1129  Mobility  Activity Ambulated with assistance in hallway;Ambulated with assistance in room;Ambulated with assistance to bathroom  Level of Assistance Standby assist, set-up cues, supervision of patient - no hands on  Assistive Device Front wheel walker  Distance Ambulated (ft) 104 ft  Activity Response Tolerated well  Mobility visit 1 Mobility  Mobility Specialist Start Time (ACUTE ONLY) 1031  Mobility Specialist Stop Time (ACUTE ONLY) 1108  Mobility Specialist Time Calculation (min) (ACUTE ONLY) 37 min   Pt supine upon entry, utilizing RA. Pt agreeable to OOB activity this date. Pt completed bed mob ModI, dangled EOB for ~ 2 mins before standing--- slight R knee pain while at rest. Pt STS to RW x2 requiring CGA for the second bout. Pt amb ~80 ft (one hall) with close supervision, endorsing 5/10 R knee pain. Pt returned to the room, stood by the sink and completed a wash up/ bath indep w/ setup. Pt returned to bed, left supine with needs within reach. RN notified.  Versa Gore Mobility Specialist 06/27/23 11:43 AM

## 2023-06-28 ENCOUNTER — Other Ambulatory Visit: Payer: Self-pay

## 2023-06-28 LAB — CULTURE, BLOOD (ROUTINE X 2)
Culture: NO GROWTH
Culture: NO GROWTH

## 2023-06-28 LAB — GLUCOSE, CAPILLARY
Glucose-Capillary: 135 mg/dL — ABNORMAL HIGH (ref 70–99)
Glucose-Capillary: 116 mg/dL — ABNORMAL HIGH (ref 70–99)
Glucose-Capillary: 107 mg/dL — ABNORMAL HIGH (ref 70–99)

## 2023-06-28 MED ORDER — LINEZOLID 600 MG PO TABS
600.0000 mg | ORAL_TABLET | Freq: Two times a day (BID) | ORAL | Status: DC
  Administered 2023-06-28: 600 mg via ORAL
  Filled 2023-06-28: qty 1

## 2023-06-28 MED ORDER — LINEZOLID 600 MG PO TABS
600.0000 mg | ORAL_TABLET | Freq: Two times a day (BID) | ORAL | 0 refills | Status: AC
  Filled 2023-06-28: qty 28, 14d supply, fill #0

## 2023-06-28 MED ORDER — METFORMIN HCL 500 MG PO TABS
1000.0000 mg | ORAL_TABLET | Freq: Two times a day (BID) | ORAL | 0 refills | Status: AC
  Filled 2023-06-28: qty 120, 30d supply, fill #0

## 2023-06-28 MED ORDER — ACETAMINOPHEN 325 MG PO TABS: 650.0000 mg | ORAL_TABLET | Freq: Four times a day (QID) | ORAL | Status: AC | PRN

## 2023-06-28 NOTE — Discharge Summary (Signed)
 Physician Discharge Summary   Patient: Paul Hitz [name] MRN: [id number] DOB: 20-Nov-1945  Admit date:     06/23/2023  Discharge date: 06/28/2023  Discharge Physician: Montey Apa   PCP: Lenell Query, PA-C   Recommendations at discharge:    Follow up with Orthopedic surgery Follow up with Primary Care Follow right knee aspirate cultures  Repeat CBC, CMP at follow up  Discharge Diagnoses: Principal Problem:   Right knee pain Active Problems:   Septic prepatellar bursitis of right knee   CLL (chronic lymphocytic leukemia) (HCC)   Chronic systolic CHF (congestive heart failure) (HCC)   Essential hypertension   COPD (chronic obstructive pulmonary disease) (HCC)   Chronic kidney disease, stage 3a (HCC)   HLD (hyperlipidemia)   Hypothyroidism   IDA (iron  deficiency anemia)   Leg edema, right   Type II diabetes mellitus with renal manifestations (HCC)  Resolved Problems:   * No resolved hospital problems. *  Hospital Course:  Paul Nakanishi [name] is a 78 y.o. male with medical history significant of CLL, HTN, HLD, DM, hypothyroidism, testicular mass, CKD-3, BPH, prostatitis, orchitis, epididymitis, SDH, OSA not on CPAP, chronic pain, who presents with right knee pain and swelling... See H&P for full HPI on admission & ED course.   Patient was admitted and started on IV antibiotics for septic pre-patellar bursitis of the right knee.  Orthopedic surgery was consulted but did not identify any drain-able fluid collection.    Further hospital course and management as outlined below.  6/3 -- pt doing well.  He is requesting discharge today and is medically stable and cleared by Ortho.  Culture results pending but given clinical improvement, patient would rather discharge today and be contacted if cultures show results not covered by antibiotics used.    Assessment and Plan:  Right knee pain due to septic prepatellar bursitis of right knee: Patient has  fever 100.9, but does not meet criteria for sepsis (WBC 10.5, temperature 100.9, heart rate 74, RR 18, lactic acid 1.2). uric acid normal 5.3. EDP has reviewed with Dr. Clyda Dark of orthopedics, who agrees with plan for IV antibiotics, no focal fluctuance noted at this time that would be amenable to drainage.   --Treated with empiric IV vancomycin  and Rocephin  --Discharge on linezolid  x 14 days --Follow up with Orthopedic surgery  --Follow cultures to final --PRN Zofran  for nausea, and Tylenol , Percocet for pain --Blood cultures negative --PT evaluation   Hypokalemia - K 2.9 on 5/30 was replaced. --BMP in AM   Leg edema, right Venous duplex U/S negative for DVT  --Mgmt as above   CLL (chronic lymphocytic leukemia) (HCC): Patient is not taking Brukinsa  currently --F/u with Dr. Wilhelmenia Harada of oncology   Chronic systolic CHF (congestive heart failure) (HCC): 2D echo on 08/05/2022 showed EF of 45-50% with grade 1 diastolic dysfunction.  Patient does not have SOB, but has elevated BNP 2392.  Patient is high risk of developing CHF exacerbation.  Patient is not taking Lasix  and spironolactone  currently. --On Entresto  --Dc'd Lasix  and spironolactone  per patient   Essential hypertension --Entresto  --Hold Lasix , spironolactone  -- dc'd orders as pt reports recently being told not to take these, he declines them --IV hydralazine  as needed   COPD (chronic obstructive pulmonary disease) (HCC): Stable --Bronchodilators and as needed Mucinex    Chronic kidney disease, stage 3a (HCC): Renal function stable.  Recent baseline creatinine 1.61 on 05/25/2023.  His creatinine is 1.24, BUN 14, GFR 60. --Follow CBC   HLD (hyperlipidemia) --  Crestor    Hypothyroidism --Synthroid    IDA (iron  deficiency anemia): Hemoglobin stab2le 9.9 (9.4 on 06/01/2023) --Follow-up with CBC   Type II diabetes mellitus with renal manifestations [facility]): Recent A1c 5.9, well-controlled.  Pt home regimen is Jardiance , metformin ,  Wegovy --Sliding scale Novolog          Consultants: Ortho, IR Procedures performed: FNA of knee effusion  Disposition: Home Diet recommendation:  Cardiac and Carb modified diet DISCHARGE MEDICATION: Allergies as of 06/28/2023   No Known Allergies      Medication List     PAUSE taking these medications    Brukinsa  80 MG capsule Wait to take this until your doctor or other care provider tells you to start again. Generic drug: zanubrutinib  Take 2 capsules (160 mg total) by mouth 2 (two) times daily.       STOP taking these medications    cetirizine 10 MG tablet Commonly known as: ZYRTEC   methocarbamol  750 MG tablet Commonly known as: Robaxin -750   predniSONE 20 MG tablet Commonly known as: DELTASONE   Wegovy 0.25 MG/0.5ML Soaj Generic drug: Semaglutide-Weight Management       TAKE these medications    acetaminophen  325 MG tablet Commonly known as: TYLENOL  Take 2 tablets (650 mg total) by mouth every 6 (six) hours as needed for mild pain (pain score 1-3) or fever.   albuterol  108 (90 Base) MCG/ACT inhaler Commonly known as: VENTOLIN  HFA Inhale 2 puffs into the lungs every 4 (four) hours as needed for wheezing or shortness of breath.   aspirin  EC 81 MG tablet Take 1 tablet (81 mg total) by mouth daily. Swallow whole.   benzonatate 100 MG capsule Commonly known as: TESSALON Take 100-200 mg by mouth 3 (three) times daily as needed.   empagliflozin  10 MG Tabs tablet Commonly known as: Jardiance  Take 1 tablet (10 mg total) by mouth daily before breakfast.   fluticasone -salmeterol 250-50 MCG/ACT Aepb Commonly known as: ADVAIR Inhale 1 puff into the lungs in the morning and at bedtime.   furosemide  20 MG tablet Commonly known as: LASIX  Take 20 mg by mouth daily as needed.   hydrALAZINE  50 MG tablet Commonly known as: APRESOLINE  Take 2 tablets (100 mg total) by mouth 2 (two) times daily.   ipratropium 0.03 % nasal spray Commonly known as:  ATROVENT Place 2 sprays into the nose 2 (two) times daily.   levothyroxine  200 MCG tablet Commonly known as: SYNTHROID  Take 1 tablet (200 mcg total) by mouth daily before breakfast.   linezolid  600 MG tablet Commonly known as: Zyvox  Take 1 tablet (600 mg total) by mouth 2 (two) times daily for 14 days.   metFORMIN  500 MG tablet Commonly known as: GLUCOPHAGE  Take 2 tablets (1,000 mg total) by mouth 2 (two) times daily with a meal.   metoprolol  succinate 25 MG 24 hr tablet Commonly known as: Toprol  XL Take 1 tablet (25 mg total) by mouth daily.   oxyCODONE -acetaminophen  10-325 MG tablet Commonly known as: Percocet Take 1 tablet by mouth every 8 (eight) hours as needed for pain. Must last 30 days.   oxyCODONE -acetaminophen  10-325 MG tablet Commonly known as: Percocet Take 1 tablet by mouth every 8 (eight) hours as needed for pain. Must last 30 days. Start taking on: July 17, 2023   oxyCODONE -acetaminophen  10-325 MG tablet Commonly known as: Percocet Take 1 tablet by mouth every 8 (eight) hours as needed for pain. Must last 30 days. Start taking on: August 16, 2023   rosuvastatin  40 MG tablet  Commonly known as: CRESTOR  Take 1 tablet (40 mg total) by mouth daily.   sacubitril -valsartan  97-103 MG Commonly known as: ENTRESTO  Take 1 tablet by mouth 2 (two) times daily.   spironolactone  25 MG tablet Commonly known as: ALDACTONE  Take 0.5 tablets (12.5 mg total) by mouth daily.        Discharge Exam: Filed Weights   06/26/23 0500 06/27/23 0500 06/28/23 0500  Weight: 98.3 kg 101.3 kg 102 kg   General exam: awake, alert, no acute distress HEENT: moist mucus membranes, hearing grossly normal  Respiratory system: on room air, normal respiratory effort. Cardiovascular system: RRR, no pedal edema.   Gastrointestinal system: soft, NT, ND, no HSM felt, +bowel sounds. Central nervous system: A&O x3. no gross focal neurologic deficits, normal speech Extremities: right knee  swelling improved, no edema, normal tone Psychiatry: normal mood, congruent affect, judgement and insight appear normal   Condition at discharge: stable  The results of significant diagnostics from this hospitalization (including imaging, microbiology, ancillary and laboratory) are listed below for reference.   Imaging Studies: IR US  Guide Bx Asp/Drain Result Date: 06/27/2023 INDICATION: Lateral knee patellar region small fluid collection, concern for infection EXAM: ULTRASOUND ASPIRATION RIGHT LATERAL KNEE SUPERFICIAL FLUID COLLECTION MEDICATIONS: 1% LIDOCAINE  LOCAL ANESTHESIA/SEDATION: None. COMPLICATIONS: None immediate. PROCEDURE: Informed written consent was obtained from the patient after a thorough discussion of the procedural risks, benefits and alternatives. All questions were addressed. Maximal Sterile Barrier Technique was utilized including caps, mask, sterile gowns, sterile gloves, sterile drape, hand hygiene and skin antiseptic. A timeout was performed prior to the initiation of the procedure. Previous imaging reviewed. Preliminary ultrasound performed. The right lateral knee small fluid collection was localized and marked with ultrasound. Under sterile conditions and local anesthesia, an 18 gauge needle was advanced into the small fluid collection. Syringe aspiration yielded 3 cc exudative blood tinged pink fluid. Sample sent for cultures. Needle removed. Postprocedure imaging demonstrates no hemorrhage or hematoma. Patient tolerated biopsy well. IMPRESSION: Successful ultrasound right lateral knee superficial fluid collection aspiration. Electronically Signed   By: Melven Stable.  Shick M.D.   On: 06/27/2023 12:33   US  RT LOWER EXTREM LTD SOFT TISSUE NON VASCULAR Result Date: 06/25/2023 CLINICAL DATA:  Right knee pain and swelling. Evaluate for drainable fluid collection EXAM: ULTRASOUND RIGHT LOWER EXTREMITY LIMITED TECHNIQUE: Ultrasound examination of the lower extremity soft tissues was performed  in the area of clinical concern in the region of the right knee. COMPARISON:  None Available. FINDINGS: A fluid collection with irregular margins is seen in the anterior and lateral soft tissues, just above the patella. This measures 5.2 x 1.4 by 4.5 cm. IMPRESSION: Focal fluid collection in the anterior and lateral soft tissues of the right knee, just above the patella. Electronically Signed   By: Marlyce Sine M.D.   On: 06/25/2023 17:29   US  Venous Img Lower Unilateral Right (DVT) Result Date: 06/25/2023 CLINICAL DATA:  Right lower extremity edema EXAM: RIGHT LOWER EXTREMITY VENOUS DOPPLER ULTRASOUND TECHNIQUE: Gray-scale sonography with compression, as well as color and duplex ultrasound, were performed to evaluate the deep venous system(s) from the level of the common femoral vein through the popliteal and proximal calf veins. COMPARISON:  None Available. FINDINGS: VENOUS Normal compressibility of the common femoral, superficial femoral, and popliteal veins, as well as the visualized calf veins. Visualized portions of profunda femoral vein and great saphenous vein unremarkable. No filling defects to suggest DVT on grayscale or color Doppler imaging. Doppler waveforms show normal direction of venous flow,  normal respiratory plasticity and response to augmentation. Limited views of the contralateral common femoral vein are unremarkable. OTHER Mildly prominent superficial inguinal lymph nodes. Limitations: none IMPRESSION: 1. Negative for acute DVT. 2. Mildly prominent superficial inguinal lymph nodes are likely reactive. Electronically Signed   By: Fernando Hoyer M.D.   On: 06/25/2023 09:25   DG Knee Complete 4 Views Right Result Date: 06/23/2023 CLINICAL DATA:  Right knee swelling EXAM: RIGHT KNEE - COMPLETE 4+ VIEW COMPARISON:  01/01/2022 FINDINGS: No fracture or malalignment. Joint space calcifications consistent with chondrocalcinosis. Small knee effusion. Mild tricompartment arthritis of the knee.  Vascular calcifications IMPRESSION: Mild tricompartment arthritis with chondrocalcinosis and small knee effusion. Electronically Signed   By: Esmeralda Hedge M.D.   On: 06/23/2023 19:31   NM PET CT MYOCARDIAL SARCOIDOSIS Result Date: 06/23/2023   FDG uptake findings are inconsistent with active myocardial inflammation/sarcoidosis.   FDG uptake was not observed. LV perfusion is abnormal. Defect 1: There is a small defect with moderate reduction in uptake present in the basal inferolateral location(s). FDG uptake was not present in the described defect segment(s). There is abnormal wall motion in the defect area. Perfusion defect is in similar location to the LGE seen on MRI. Findings appear inconsistent with active cardiac sarcoidosis, though findings may represent fibrotic stage of cardiac sarcoid if clinical concern persists.   Left ventricular function is abnormal. EF: 21%. End diastolic cavity size is severely enlarged. End systolic cavity size is severely enlarged.   Coronary calcium  was present on the attenuation correction CT images. Mild coronary calcifications were present. Coronary calcifications were present in the left anterior descending artery distribution(s). Aortic valve calcifications.   Electronically signed by: Euell Herrlich, MD CLINICAL DATA:  This over-read does not include interpretation of cardiac or coronary anatomy or pathology. The cardiac PET interpretation by the cardiologist is attached. COMPARISON:  04/27/2023 CTA chest FINDINGS: No pleural fluid. Motion degradation. Right lower lobe 5 mm pulmonary nodule on 07/03 can be presumed benign and do/does not warrant imaging follow-up per Fleischner criteria. Suspect smaller right upper lobe subpleural pulmonary nodules which are of doubtful clinical significance. Aortic atherosclerosis. Pulmonary artery enlargement, outflow tract 3.5 cm. No imaged thoracic adenopathy. Normal imaged portions of the liver, spleen, stomach, pancreas, adrenal  glands. Old left rib fractures. IMPRESSION: 1. Motion degradation. 2.  No acute findings in the imaged extracardiac chest. 3. Pulmonary artery enlargement suggests pulmonary arterial hypertension. 4.  Aortic Atherosclerosis (ICD10-I70.0). Electronically Signed   By: Lore Rode M.D.   On: 06/23/2023 13:39   Microbiology: Results for orders placed or performed during the hospital encounter of 06/23/23  Culture, blood (routine x 2)     Status: None   Collection Time: 06/23/23  7:00 PM   Specimen: BLOOD  Result Value Ref Range Status   Specimen Description BLOOD BLOOD RIGHT ARM  Final   Special Requests   Final    BOTTLES DRAWN AEROBIC AND ANAEROBIC Blood Culture results may not be optimal due to an inadequate volume of blood received in culture bottles   Culture   Final    NO GROWTH 5 DAYS Performed at [facility], [address] [place], [place] [address]    Report Status 06/28/2023 FINAL  Final  Culture, blood (routine x 2)     Status: None   Collection Time: 06/23/23  7:29 PM   Specimen: BLOOD  Result Value Ref Range Status   Specimen Description BLOOD BLOOD RIGHT HAND  Final  Special Requests   Final    BOTTLES DRAWN AEROBIC AND ANAEROBIC Blood Culture results may not be optimal due to an inadequate volume of blood received in culture bottles   Culture   Final    NO GROWTH 5 DAYS Performed at [facility], [address] [place], [place] [address]    Report Status 06/28/2023 FINAL  Final  Body fluid culture w Gram Stain     Status: None (Preliminary result)   Collection Time: 06/27/23 12:00 PM   Specimen: Synovium; Body Fluid  Result Value Ref Range Status   Specimen Description   Final    SYNOVIAL Performed at [facility], [address] [place], [place] [address]    Special Requests   Final    KNEE JOINT FLUID Performed at [facility], [address] [place], [place] [address]    Gram Stain   Final    FEW WBC PRESENT,  PREDOMINANTLY PMN RARE GRAM POSITIVE COCCI IN CLUSTERS Performed at [facility], [address] [place], [place] [address]    Culture PENDING  Incomplete   Report Status PENDING  Incomplete    Labs: CBC: Recent Labs  Lab 06/23/23 1741 06/24/23 0446 06/26/23 0507  WBC 10.5  10.3 9.6 7.0  NEUTROABS 6.3  --   --   HGB 9.9*  9.8* 8.7* 9.5*  HCT 31.2*  31.1* 27.4* 30.0*  MCV 91.8  91.5 90.4 91.5  PLT 160  155 140* 171   Basic Metabolic Panel: Recent Labs  Lab 06/23/23 1741 06/24/23 0446 06/25/23 0948 06/26/23 0507 06/27/23 0252  NA 137 135 136 138 138  K 3.6 2.9* 3.6 3.9 3.5  CL 109 108 107 109 110  CO2 22 21* 25 23 22   GLUCOSE 113* 197* 116* 112* 121*  BUN 14 14 12 16 12   CREATININE 1.24 1.20 1.07 1.01 0.99  CALCIUM  8.4* 7.9* 7.9* 8.0* 8.0*  MG  --   --   --  2.1  --    Liver Function Tests: No results for input(s): AST, ALT, ALKPHOS, BILITOT, PROT, ALBUMIN in the last 168 hours. CBG: Recent Labs  Lab 06/27/23 1208 06/27/23 1652 06/27/23 2030 06/28/23 0524 06/28/23 0740  GLUCAP 113* 104* 128* 135* 116*    Discharge time spent: less than 30 minutes.  Signed: Montey Apa, DO [facility] 06/28/2023

## 2023-06-28 NOTE — Progress Notes (Signed)
 Physical Therapy Treatment Patient Details Name: Paul Milne [name] MRN: [id number] DOB: September 20, 1945 Today's Date: 06/28/2023   History of Present Illness Paul Snyders [name] is a 78 y.o. male with medical history significant of Chronic Lymphocytic Leukemia, HTN, HLD, DM, hypothyroidism, testicular mass, CKD-3, BPH, prostatitis, orchitis, epididymitis, SDH, OSA not on CPAP, chronic pain, who presents with right knee pain and swelling. He was admitted with septic prepatellar bursitis of right knee.    PT Comments  Overall, pt demonstrated much improved activity tolerance and safety with mobility, transfers, and gait without AD at Supervision to independent level. Pt reports that his knee feels much better. Demonstrated confidence and no LOB during gait without AD. Practiced visual scanning and head turns without LOB.  DC recs remain appropriate to maximize pt's independence and safety with all mobility.      If plan is discharge home, recommend the following: Assistance with cooking/housework;Assist for transportation   Can travel by private vehicle        Equipment Recommendations  Other (comment) (less pain in R knee, pt moving well without AD this session)    Recommendations for Other Services       Precautions / Restrictions Precautions Precautions: Fall Restrictions Weight Bearing Restrictions Per Provider Order: No     Mobility  Bed Mobility Overal bed mobility: Independent                  Transfers Overall transfer level: Independent                 General transfer comment: sit<>stand  with little to no UE support    Ambulation/Gait Ambulation/Gait assistance: Supervision Gait Distance (Feet): 160 Feet Assistive device: None Gait Pattern/deviations: WFL(Within Functional Limits)       General Gait Details: Pt reports that his knee feels much  better.  Demonstrated confidence and no LOB during gait without AD.  Practiced visual scanning  and head turns without LOB.   Stairs             Wheelchair Mobility     Tilt Bed    Modified Rankin (Stroke Patients Only)       Balance Overall balance assessment: Modified Independent Sitting-balance support: No upper extremity supported Sitting balance-Leahy Scale: Normal     Standing balance support: During functional activity, Single extremity supported Standing balance-Leahy Scale: Good Standing balance comment: intermittent UE support for dynamic balance recommended, no LOB noted                            Communication Communication Communication: No apparent difficulties Factors Affecting Communication: Hearing impaired  Cognition Arousal: Alert Behavior During Therapy: WFL for tasks assessed/performed   PT - Cognitive impairments: No apparent impairments                         Following commands: Intact      Cueing Cueing Techniques: Verbal cues, Gestural cues  Exercises      General Comments General comments (skin integrity, edema, etc.): Recommend pt be proactive and get AD from his shed at d/c for times when his knee has increased pain for safety.      Pertinent Vitals/Pain Pain Assessment Pain Score: 2  Pain Location: R knee (also complains of low back pain and B shoulder pain) Pain Descriptors / Indicators: Discomfort Pain Intervention(s): Monitored during session    Home Living  Prior Function            PT Goals (current goals can now be found in the care plan section) Acute Rehab PT Goals Patient Stated Goal: get back to fishing PT Goal Formulation: With patient Time For Goal Achievement: 07/09/23 Potential to Achieve Goals: Good Progress towards PT goals: Progressing toward goals    Frequency    Min 2X/week      PT Plan      Co-evaluation              AM-PAC PT "6 Clicks" Mobility   Outcome Measure  Help needed turning from your back to your side  while in a flat bed without using bedrails?: None Help needed moving from lying on your back to sitting on the side of a flat bed without using bedrails?: None Help needed moving to and from a bed to a chair (including a wheelchair)?: None Help needed standing up from a chair using your arms (e.g., wheelchair or bedside chair)?: None Help needed to walk in hospital room?: None Help needed climbing 3-5 steps with a railing? : A Little 6 Click Score: 23    End of Session   Activity Tolerance: Patient tolerated treatment well Patient left: in chair Nurse Communication: Mobility status PT Visit Diagnosis: Other abnormalities of gait and mobility (R26.89);History of falling (Z91.81);Pain;Muscle weakness (generalized) (M62.81) Pain - Right/Left: Right Pain - part of body: Knee     Time: 1610-9604 PT Time Calculation (min) (ACUTE ONLY): 11 min  Charges:    $Therapeutic Activity: 8-22 mins PT General Charges $$ ACUTE PT VISIT: 1 Visit                     Paul Briggs, PTA  06/28/23, 9:05 AM

## 2023-06-28 NOTE — TOC Transition Note (Signed)
 Transition of Care [facility]) - Discharge Note   Patient Details  Name: Paul Roback [name] MRN: [id number] Date of Birth: 1945/02/01  [facility] of [facility] CM/SW Contact:  Alexandra Ice, RN Phone Number: 06/28/2023, 1:34 PM   Clinical Narrative:    Patient to discharge today, to home. Patient has home DME, and unable to get replacement due to recently receiving within the past 5 years. Patient declined home health services. Stated he would be able to get transportation home.   Final next level of care: Home/Self Care Barriers to Discharge: Barriers Resolved   Patient Goals and CMS Choice Patient states their goals for this hospitalization and ongoing recovery are:: feel better          Discharge Placement                    Patient and family notified of of transfer: 06/28/23  Discharge Plan and Services Additional resources added to the After Visit Summary for                    DME Agency: NA       HH Arranged: NA          Social Drivers of Health (SDOH) Interventions SDOH Screenings   Food Insecurity: No Food Insecurity (06/23/2023)  Housing: Unknown (06/23/2023)  Transportation Needs: No Transportation Needs (06/23/2023)  Utilities: Not At Risk (06/23/2023)  Alcohol Screen: Low Risk  (01/22/2022)  Depression (PHQ2-9): Low Risk  (06/09/2023)  Financial Resource Strain: Patient Declined (04/26/2023)   Received from [facility]  Recent Concern: Financial Resource Strain - Medium Risk (02/24/2023)   Received from [facility]  Physical Activity: Unknown (07/26/2022)  Social Connections: Socially Integrated (06/23/2023)  Stress: Stress Concern Present (07/26/2022)  Tobacco Use: Medium Risk (06/23/2023)   Received from [facility]     Readmission Risk Interventions    10/01/2022    9:51 AM 08/29/2022   10:57 AM  Readmission Risk Prevention Plan  Transportation Screening Complete Complete  PCP or  Specialist Appt within 3-5 Days Complete   HRI or Home Care Consult Patient refused   Social Work Consult for Recovery Care Planning/Counseling Complete   Palliative Care Screening Not Applicable   Medication Review Oceanographer) Complete Complete  PCP or Specialist appointment within 3-5 days of discharge  Complete  HRI or Home Care Consult  Complete  SW Recovery Care/Counseling Consult  Complete  Palliative Care Screening  Not Applicable  Skilled Nursing Facility  Complete

## 2023-06-28 NOTE — Discharge Instructions (Signed)
 While on the antibiotic linezolid avoid or minimize following foods and drinks as they may interact with linezolid.  These foods and drinks contain tyramine.  Tyramine is a natural product found in some plants and animals.  Too much tyramine in combination with linezolid can cause high levels of serotonin in the body.  Serotonin is a chemical in our body that controls mood, sleep, digestion, and other functions.  Several signs of too much serotonin in the body (also called serotonin syndrome) are fast heart rate, sweating, fevers, high blood pressure, muscle twitching, and confusion.  It is important to seek medical attention if you have these symptoms.   Avoid foods and beverages that are high in tyramine while taking linezolid, including: Alcoholic beverages (such as beer, red wine, vermouth, sherry) Aged cheeses (such as cheddar, blue cheese, swiss, feta, parmesan, camembert) Fermented or pickled foods (such as sauerkraut, pickled beets, pickled peppers, pickled cucumbers/pickles, kim chee/kimchi)  Dried/aged, smoked or processed meats and sausages (such as salami, pepperoni, liverwurst, hot dogs, bologna, bacon) Soybean products (such as soy sauce, tofu) Preserved fish (such as pickled herring) Products that contain large amounts of yeast (such as bouillon cubes, powdered soup/gravy, homemade or sourdough bread)  Broad/fava beans  Following foods are OK to eat while taking linezolid: Pasteurized cheeses (such as [place], ricotta, cottage cheese, cream cheese) Vegetables (not fermented or pickled) Non-cured or smoked meats

## 2023-06-29 ENCOUNTER — Inpatient Hospital Stay: Attending: Oncology

## 2023-06-29 ENCOUNTER — Inpatient Hospital Stay

## 2023-06-29 ENCOUNTER — Inpatient Hospital Stay: Admitting: Oncology

## 2023-06-29 DIAGNOSIS — D509 Iron deficiency anemia, unspecified: Secondary | ICD-10-CM | POA: Insufficient documentation

## 2023-06-29 DIAGNOSIS — I700 Atherosclerosis of aorta: Secondary | ICD-10-CM | POA: Insufficient documentation

## 2023-06-29 DIAGNOSIS — E039 Hypothyroidism, unspecified: Secondary | ICD-10-CM | POA: Diagnosis not present

## 2023-06-29 DIAGNOSIS — I3139 Other pericardial effusion (noninflammatory): Secondary | ICD-10-CM | POA: Diagnosis not present

## 2023-06-29 DIAGNOSIS — I5022 Chronic systolic (congestive) heart failure: Secondary | ICD-10-CM

## 2023-06-29 DIAGNOSIS — C9110 Chronic lymphocytic leukemia of B-cell type not having achieved remission: Secondary | ICD-10-CM | POA: Insufficient documentation

## 2023-06-29 LAB — CBC WITH DIFFERENTIAL (CANCER CENTER ONLY)
Abs Immature Granulocytes: 0.02 10*3/uL (ref 0.00–0.07)
Basophils Absolute: 0.1 10*3/uL (ref 0.0–0.1)
Basophils Relative: 1 %
Eosinophils Absolute: 0.2 10*3/uL (ref 0.0–0.5)
Eosinophils Relative: 4 %
HCT: 30.1 % — ABNORMAL LOW (ref 39.0–52.0)
Hemoglobin: 9.4 g/dL — ABNORMAL LOW (ref 13.0–17.0)
Immature Granulocytes: 0 %
Lymphocytes Relative: 35 %
Lymphs Abs: 1.8 10*3/uL (ref 0.7–4.0)
MCH: 28.1 pg (ref 26.0–34.0)
MCHC: 31.2 g/dL (ref 30.0–36.0)
MCV: 90.1 fL (ref 80.0–100.0)
Monocytes Absolute: 0.6 10*3/uL (ref 0.1–1.0)
Monocytes Relative: 10 %
Neutro Abs: 2.6 10*3/uL (ref 1.7–7.7)
Neutrophils Relative %: 50 %
Platelet Count: 228 10*3/uL (ref 150–400)
RBC: 3.34 MIL/uL — ABNORMAL LOW (ref 4.22–5.81)
RDW: 20.3 % — ABNORMAL HIGH (ref 11.5–15.5)
WBC Count: 5.3 10*3/uL (ref 4.0–10.5)
nRBC: 0 % (ref 0.0–0.2)

## 2023-06-29 LAB — IRON AND TIBC
Iron: 28 ug/dL — ABNORMAL LOW (ref 45–182)
Saturation Ratios: 12 % — ABNORMAL LOW (ref 17.9–39.5)
TIBC: 234 ug/dL — ABNORMAL LOW (ref 250–450)
UIBC: 206 ug/dL

## 2023-06-29 LAB — CMP (CANCER CENTER ONLY)
ALT: 18 U/L (ref 0–44)
AST: 29 U/L (ref 15–41)
Albumin: 3.4 g/dL — ABNORMAL LOW (ref 3.5–5.0)
Alkaline Phosphatase: 83 U/L (ref 38–126)
Anion gap: 8 (ref 5–15)
BUN: 10 mg/dL (ref 8–23)
CO2: 21 mmol/L — ABNORMAL LOW (ref 22–32)
Calcium: 8.1 mg/dL — ABNORMAL LOW (ref 8.9–10.3)
Chloride: 108 mmol/L (ref 98–111)
Creatinine: 1.21 mg/dL (ref 0.61–1.24)
GFR, Estimated: >60 mL/min (ref >60)
Glucose, Bld: 109 mg/dL — ABNORMAL HIGH (ref 70–99)
Potassium: 3.5 mmol/L (ref 3.5–5.1)
Sodium: 137 mmol/L (ref 135–145)
Total Bilirubin: 0.6 mg/dL (ref 0.0–1.2)
Total Protein: 6.5 g/dL (ref 6.5–8.1)

## 2023-06-29 LAB — RETIC PANEL
Immature Retic Fract: 15 % (ref 2.3–15.9)
RBC.: 3.33 MIL/uL — ABNORMAL LOW (ref 4.22–5.81)
Retic Count, Absolute: 29.6 10*3/uL (ref 19.0–186.0)
Retic Ct Pct: 0.9 % (ref 0.4–3.1)
Reticulocyte Hemoglobin: 25.8 pg — ABNORMAL LOW (ref >27.9)

## 2023-06-29 LAB — BODY FLUID CULTURE W GRAM STAIN

## 2023-06-29 LAB — TSH: TSH: 28.297 u[IU]/mL — ABNORMAL HIGH (ref 0.350–4.500)

## 2023-06-29 LAB — FERRITIN: Ferritin: 94 ng/mL (ref 24–336)

## 2023-06-29 LAB — T4, FREE: Free T4: 0.74 ng/dL (ref 0.61–1.12)

## 2023-06-30 ENCOUNTER — Ambulatory Visit (HOSPITAL_COMMUNITY): Payer: Self-pay | Admitting: Internal Medicine

## 2023-06-30 ENCOUNTER — Ambulatory Visit: Admitting: Medical

## 2023-06-30 ENCOUNTER — Encounter: Admitting: Internal Medicine

## 2023-06-30 ENCOUNTER — Other Ambulatory Visit: Payer: Self-pay

## 2023-07-04 ENCOUNTER — Other Ambulatory Visit: Payer: Self-pay

## 2023-07-05 DIAGNOSIS — B9689 Other specified bacterial agents as the cause of diseases classified elsewhere: Secondary | ICD-10-CM | POA: Diagnosis not present

## 2023-07-05 DIAGNOSIS — M71161 Other infective bursitis, right knee: Secondary | ICD-10-CM | POA: Diagnosis not present

## 2023-07-05 DIAGNOSIS — Z09 Encounter for follow-up examination after completed treatment for conditions other than malignant neoplasm: Secondary | ICD-10-CM | POA: Diagnosis not present

## 2023-07-05 DIAGNOSIS — E876 Hypokalemia: Secondary | ICD-10-CM | POA: Diagnosis not present

## 2023-07-05 DIAGNOSIS — E119 Type 2 diabetes mellitus without complications: Secondary | ICD-10-CM | POA: Diagnosis not present

## 2023-07-05 DIAGNOSIS — E785 Hyperlipidemia, unspecified: Secondary | ICD-10-CM | POA: Diagnosis not present

## 2023-07-05 DIAGNOSIS — C9110 Chronic lymphocytic leukemia of B-cell type not having achieved remission: Secondary | ICD-10-CM | POA: Diagnosis not present

## 2023-07-05 DIAGNOSIS — N1831 Chronic kidney disease, stage 3a: Secondary | ICD-10-CM | POA: Diagnosis not present

## 2023-07-05 DIAGNOSIS — M48062 Spinal stenosis, lumbar region with neurogenic claudication: Secondary | ICD-10-CM | POA: Diagnosis not present

## 2023-07-06 ENCOUNTER — Inpatient Hospital Stay (HOSPITAL_BASED_OUTPATIENT_CLINIC_OR_DEPARTMENT_OTHER): Admitting: Oncology

## 2023-07-06 ENCOUNTER — Other Ambulatory Visit: Payer: Self-pay

## 2023-07-06 ENCOUNTER — Encounter: Payer: Self-pay | Admitting: Oncology

## 2023-07-06 ENCOUNTER — Inpatient Hospital Stay

## 2023-07-06 VITALS — BP 140/77 | HR 56

## 2023-07-06 VITALS — BP 148/80 | HR 59 | Temp 96.0°F | Resp 18 | Wt 220.2 lb

## 2023-07-06 DIAGNOSIS — C9110 Chronic lymphocytic leukemia of B-cell type not having achieved remission: Secondary | ICD-10-CM

## 2023-07-06 DIAGNOSIS — D509 Iron deficiency anemia, unspecified: Secondary | ICD-10-CM

## 2023-07-06 MED ORDER — IRON SUCROSE 20 MG/ML IV SOLN
200.0000 mg | Freq: Once | INTRAVENOUS | Status: AC
  Administered 2023-07-06: 200 mg via INTRAVENOUS

## 2023-07-06 NOTE — Assessment & Plan Note (Signed)
 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 Lab Results  Component Value Date   HGB 9.4 (L) 06/29/2023   TIBC 234 (L) 06/29/2023   IRONPCTSAT 12 (L) 06/29/2023   FERRITIN 94 06/29/2023   recurrent iron  deficiency anemia.   Recommend Venofer  weekly x 3 Colonoscopy findings were reviewed. No obvious source of bleeding.

## 2023-07-06 NOTE — Assessment & Plan Note (Addendum)
 Labs are reviewed and discussed with patient. CT chest abdomen pelvis results reviewed and discussed with patient.   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 Wbc is within normal limit. continue to hold hold off Zanubrutinib  160 mg twice daily, .

## 2023-07-06 NOTE — Progress Notes (Signed)
 Hematology/Oncology Progress note [phone] [phone]        ASSESSMENT & PLAN:   Cancer Staging  CLL (chronic lymphocytic leukemia) (HCC) Staging form: Chronic Lymphocytic Leukemia / Small Lymphocytic Lymphoma, AJCC 8th Edition - Clinical stage from 12/28/2021: Modified Rai Stage III (Modified Rai risk: High, Lymphocytosis: Present, Adenopathy: Present, Organomegaly: Absent, Anemia: Present, Thrombocytopenia: Absent) - Signed by [name], MD on 01/20/2022   CLL (chronic lymphocytic leukemia) [facility]) Labs are reviewed and discussed with patient. CT chest abdomen pelvis results reviewed and discussed with patient.   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 Wbc is within normal limit. continue to hold hold off Zanubrutinib  160 mg twice daily, .   IDA (iron  deficiency anemia) Anemia is likely multifactorial, could be due to CLL, anemia due to chronic kidney disease and IDA. Lab Results  Component Value Date   HGB 9.4 (L) 06/29/2023   TIBC 234 (L) 06/29/2023   IRONPCTSAT 12 (L) 06/29/2023   FERRITIN 94 06/29/2023   recurrent iron  deficiency anemia.   Recommend Venofer  weekly x 3 Colonoscopy findings were reviewed. No obvious source of bleeding.       Orders Placed This Encounter  Procedures   CBC with Differential (Cancer Center Only)    Standing Status:   Future    Expected Date:   08/17/2023    Expiration Date:   07/05/2024   CMP (Cancer Center only)    Standing Status:   Future    Expected Date:   08/17/2023    Expiration Date:   07/05/2024   Iron  and TIBC    Standing Status:   Future    Expected Date:   08/17/2023    Expiration Date:   07/05/2024   Ferritin    Standing Status:   Future    Expected Date:   08/17/2023    Expiration Date:   07/05/2024   Retic Panel    Standing Status:   Future    Expected Date:   08/17/2023    Expiration Date:   07/05/2024   Flow  cytometry panel-leukemia/lymphoma work-up    Standing Status:   Future    Expected Date:   08/17/2023    Expiration Date:   07/05/2024   Follow up   6 weeks lab Md Venofer   All questions were answered. The patient knows to call the clinic with any problems, questions or concerns.  [name], MD, PhD [facility] Hematology Oncology 07/06/2023    CHIEF COMPLAINTS/PURPOSE OF CONSULTATION:  CLL  HISTORY OF PRESENTING ILLNESS:  Paul Grave [name] 78 y.o. male presents for follow up of CLL I have reviewed his chart and materials related to his cancer extensively and collaborated history with the patient. Summary of oncologic history is as follows: Oncology History  CLL (chronic lymphocytic leukemia) (HCC)  12/25/2021 - 12/26/2021 Hospital Admission   Patient was admitted due to pain and swelling of right ankle, right knee and left wrist.  Patient was found to have WBC 30.4, with neutrophil 29% and lymphocyte 2%.  Peripheral smear showed leukocytosis with slight left shift in myeloid series and lymphocytosis with abnormal morphology.  Uric acid 5.5, worsening renal function with creatinine 1.35, BUN 18, GFR 55 (baseline creatinine 1.08 on 07/06/2021), lactic acid 1.1.  ESR normal, uric acid 5.5 normal, CRP 22.2, LDH 182  Orthopedic surgery was consulted s/p arthrocentesis, cell count 296 with 72% neutrophil, no crystal. Fluid negative for  growth.  Patient received cefepime  and vancomycin  while in the hospital, Patient was discharged on Keflex  for 5 days.   12/28/2021 Cancer Staging   Staging form: Chronic Lymphocytic Leukemia / Small Lymphocytic Lymphoma, AJCC 8th Edition - Clinical stage from 12/28/2021: Modified Rai Stage III (Modified Rai risk: High, Lymphocytosis: Present, Adenopathy: Present, Organomegaly: Absent, Anemia: Present, Thrombocytopenia: Absent) - Signed by [name], MD on 01/20/2022 Stage prefix: Initial diagnosis Hemoglobin (Hgb) (g/dL): 16.1   09/30/452 Initial Diagnosis    CLL (chronic lymphocytic leukemia)   12/27/21 peripheral blood flowcytometry showed Involvement by CD5+, CD23+, CD20+, CD22+ clonal B cell population, phenotype typical for 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    01/05/2022 Imaging   CT chest abdomen pelvis wo contrast 1. Multiple prominent borderline enlarged and mildly enlarged lymph nodes, most evident in the low anatomic pelvis, as above, compatible with reported clinical history of CLL. 2. There also several small pulmonary nodules in the lungs measuring 5 mm or less in size. This is nonspecific, but statistically likely benign. No follow-up needed if patient is low-risk (and has no known or suspected primary neoplasm). Non-contrast chest CT can be considered in 12 months if patient is high-risk. This recommendation 3. Aortic atherosclerosis, in addition to left main and 2 vessel coronary artery disease. Please note that although the presence of coronary artery calcium  documents the presence of coronary artery disease, the severity of this disease and any potential stenosis cannot be assessed on this non-gated CT examination. Assessment for  potential risk factor modification, dietary therapy or pharmacologic therapy may be warranted, if clinically indicated. 4. There are calcifications of the aortic valve. Echocardiographic correlation for evaluation of potential valvular dysfunction may be warranted if clinically indicated. 5. 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 6. Diverticulosis without evidence of acute diverticulitis at this time. 7. Mild cardiomegaly.   08/26/2022 Imaging   CT abdomen pelvis w contrast  1. Subcutaneous fat stranding within the left lower quadrant anterior abdominal wall extending into the anterior proximal left thigh, likely related to  contusion given history of recent trauma. No fluid collection or hematoma. 2. No acute displaced fracture. 3. Retroperitoneal and pelvic lymphadenopathy as above, with waxing and waning appearance. 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 4. 1.9 cm hypodensity within the medial aspect of the spleen,nonspecific. Leukemic involvement of the spleen cannot be excluded. 5. Distal colonic diverticulosis without diverticulitis. 6.  Aortic Atherosclerosis      08/23/2022 Colonoscopy showed - Likely malignant partially obstructing tumor in the distal ascending colon. Biopsied. Tattooed. - Three 4 to 6 mm polyps in the transverse colon and in the ascending colon, removed with a cold snare. Resected and retrieved. - Five 3 to 7 mm polyps in the rectum, in the descending colon, in the transverse colon and in the ascending colon, removed with a hot snare. Resected and retrieved. 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 - Non- bleeding internal hemorrhoids.  Pathology showed 1. Ascending Colon Polyp, x2 cold snare - TUBULAR ADENOMA(S) (MULTIPLE FRAGMENTS) - NEGATIVE FOR HIGH-GRADE DYSPLASIA OR MALIGNANCY 2. Ascending Colon Polyp, hot snare - TUBULAR ADENOMA (MULTIPLE FRAGMENTS) - NEGATIVE FOR HIGH-GRADE DYSPLASIA OR MALIGNANCY 3. Ascending Colon Biopsy, mass cbx - TUBULAR ADENOMA (MULTIPLE FRAGMENTS) - SEE NOTE 4. Transverse Colon Polyp, hot snare; cold snare - TUBULAR ADENOMA (MULTIPLE FRAGMENTS) - NEGATIVE FOR HIGH-GRADE DYSPLASIA OR  MALIGNANCY 5. Descending Colon Polyp, x2 hot snare - TUBULAR ADENOMA(S) (MULTIPLE FRAGMENTS) - NEGATIVE FOR HIGH-GRADE DYSPLASIA OR MALIGNANCY 6. Rectum, polyp(s), hot snare - TUBULOVILLOUS ADENOMA (MULTIPLE FRAGMENTS) - NEGATIVE FOR HIGH-GRADE DYSPLASIA OR MALIGNANCY I&D Recent admission due to sepsis due to cellulitis, left groin abscess, s/p I&D  04/24/2023 s/p colonoscopy, removal of small polyps.  04/26/2023 CT chest wo contrast  Pulmonary infiltrates as  described and left pleural effusion correlate with bronchopneumonia. Follow-up recommended as clinically needed. Small pericardial effusion, correlation with echocardiography recommended as clinically needed.  04/27/2023 CT chest PE angiogram  No definite evidence of pulmonary embolus. Small left pleural effusion is noted. Airspace opacities are noted in lingular segment of left upper lobe as well as in left lower lobe most consistent with pneumonia. Minimal pericardial effusion. Aortic Atherosclerosis   INTERVAL HISTORY Paul Shall [name] is a 78 y.o. male who has above history reviewed by me today presents for follow up visit for CLL, iron  deficiency anemia, colon tubular adenoma with high-grade dysplastic status post resection. He feels well today.   He denies fever or chills.  Recent hospitalization due to right knee pain due to septic prepatella bursitis of right knee. He was treated wit IV abx. S/p FNA aspiration, culture was negative.     MEDICAL HISTORY:  Past Medical History:  Diagnosis Date   Adenoma of left adrenal gland    Anemia    Aortic atherosclerosis (HCC)    Blood transfusion without reported diagnosis    BPH (benign prostatic hyperplasia)    Bradycardia    CAD (coronary artery disease) 08/26/2022   a.) cCTA 08/26/2022: Ca2+ 14.3 (12th %'ile for age/sex.race match control); (<25%) pLAD   Cellulitis of left thigh 08/2022   Chronic pain syndrome    a.) on COT managed by pain management   Chronic, continuous use of opioids    a.) chronic pain syndrome/chronic back pain; managed by pain management   CKD (chronic kidney disease) stage 3, GFR 30-59 ml/min (HCC)    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   a.) Rai stage III   DDD (degenerative disc disease), cervical    Difficult airway 10/22/2015   a.) 1st attempt with glidescope  --> macroglossic (unsuccessful); b.) 2nd attempt with Mac 4 and direct laryngoscopy with a 8.0 ETT --> tube too  large to pass through the cords; b.) 3rd attempt (successful) with a Mac 4 and a 7.5 tube   Diverticulosis    DM (diabetes mellitus), type 2 (HCC)    Dysplasia of prostate    Erectile dysfunction    Frequent falls    Heart failure with mildly reduced ejection fraction (HFmrEF) (HCC)    a.) TTE 10/23/2015: EF 60-65%, mod-sev LVH, mild biatrial dil, degen MV disease, AoV sclerosis, asc Ao 38 mm, G1DD; b.) TTE 08/05/2022: EF 45-50%, mod-sev LVH (speckled pattern), sev biatrial dil, mild MR, AoV sclerosis, G1DD   Hepatic flexure mass 08/23/2022   a.) colonoscopy 08/23/2022: 14 x 15 mm partially obstructing ulcerated distal ascending colon mass; tissue friable --> pathology resulted as tubular adenoma, however felt to be false (-) --> referral to sugery and colectomy recommended; b.) s/p RIGHT hemicolectomy 09/28/2022   History of MRSA infection 08/28/2022   a.) MRSA PCR (+) 08/28/2022; culture from LEFT thigh abscess   HTN (hypertension)    Hyperlipidemia    Hypogonadism in male    Hypothyroidism    IDA (iron  deficiency anemia)    LBBB (left bundle branch block)  Long-term use of aspirin  therapy    Lumbar spinal stenosis    Mild cardiomegaly    Multiple lung nodules on CT    NICM (nonischemic cardiomyopathy) (HCC)    a.) TTE 10/23/2015: EF 60-65; b.) TTE 08/05/2022: EF 45-50%   Orchitis and epididymitis 06/16/2013   OSA on CPAP    Palindromic rheumatism, hand    Pericardial effusion    Prostatitis    Sepsis (HCC) 08/2022   Subdural hematoma (HCC) 10/22/2015   a.) s/p traumatic mechanical fall --> CT head 10/22/2015: high-density SDH the left cerebral convexity with13 mm midline shift --> s/p LEFT frontal burr hole craniotomy   Tubular adenoma of colon    Ventral hernia     SURGICAL HISTORY: Past Surgical History:  Procedure Laterality Date   BACK SURGERY     BIOPSY  08/21/2022   Procedure: BIOPSY;  Surgeon: Quintin Buckle, DO;  Location: [facility] ENDOSCOPY;  Service:  Gastroenterology;;   BIOPSY  08/23/2022   Procedure: BIOPSY;  Surgeon: Quintin Buckle, DO;  Location: [facility] ENDOSCOPY;  Service: Gastroenterology;;   BIOPSY  02/28/2023   Procedure: BIOPSY;  Surgeon: Quintin Buckle, DO;  Location: [facility] ENDOSCOPY;  Service: Gastroenterology;;   Kasandra Pain OF CRANIUM Left 10/22/2015   COLON SURGERY  09/28/2022   COLONOSCOPY WITH PROPOFOL  N/A 08/23/2022   Procedure: COLONOSCOPY WITH PROPOFOL ;  Surgeon: Quintin Buckle, DO;  Location: [facility] ENDOSCOPY;  Service: Gastroenterology;  Laterality: N/A;   COLONOSCOPY WITH PROPOFOL  N/A 02/28/2023   Procedure: COLONOSCOPY WITH PROPOFOL ;  Surgeon: Quintin Buckle, DO;  Location: [facility] ENDOSCOPY;  Service: Gastroenterology;  Laterality: N/A;  DM   COLONOSCOPY WITH PROPOFOL  N/A 04/25/2023   Procedure: COLONOSCOPY WITH PROPOFOL ;  Surgeon: Quintin Buckle, DO;  Location: [facility] ENDOSCOPY;  Service: Gastroenterology;  Laterality: N/A;  DM   ESOPHAGOGASTRODUODENOSCOPY (EGD) WITH PROPOFOL  N/A 08/21/2022   Procedure: ESOPHAGOGASTRODUODENOSCOPY (EGD) WITH PROPOFOL ;  Surgeon: Quintin Buckle, DO;  Location: [facility] ENDOSCOPY;  Service: Gastroenterology;  Laterality: N/A;   ESOPHAGOGASTRODUODENOSCOPY (EGD) WITH PROPOFOL  N/A 02/28/2023   Procedure: ESOPHAGOGASTRODUODENOSCOPY (EGD) WITH PROPOFOL ;  Surgeon: Quintin Buckle, DO;  Location: [facility] ENDOSCOPY;  Service: Gastroenterology;  Laterality: N/A;   GIVENS CAPSULE STUDY N/A 08/23/2022   Procedure: GIVENS CAPSULE STUDY;  Surgeon: Quintin Buckle, DO;  Location: [facility] ENDOSCOPY;  Service: Gastroenterology;  Laterality: N/A;   HERNIA REPAIR     INGUINAL HERNIA REPAIR Right 03/09/2023   Procedure: HERNIA REPAIR INGUINAL ADULT, open, RNFA to assist;  Surgeon: Alben Alma, MD;  Location: [facility] ORS;  Service: General;  Laterality: Right;   IR US  GUIDE BX ASP/DRAIN  06/27/2023   IRRIGATION AND DEBRIDEMENT ABSCESS Left 08/28/2022   Procedure: IRRIGATION AND  DEBRIDEMENT ABSCESS LEFT UPPER THIGH/GROIN;  Surgeon: Alben Alma, MD;  Location: [facility] ORS;  Service: General;  Laterality: Left;   LAPAROSCOPIC RIGHT COLECTOMY Right 09/28/2022   Procedure: LAPAROSCOPIC RIGHT COLECTOMY, RNFA to assist;  Surgeon: Alben Alma, MD;  Location: [facility] ORS;  Service: General;  Laterality: Right;   POLYPECTOMY  08/23/2022   Procedure: POLYPECTOMY;  Surgeon: Quintin Buckle, DO;  Location: [facility] ENDOSCOPY;  Service: Gastroenterology;;   POLYPECTOMY  04/25/2023   Procedure: POLYPECTOMY;  Surgeon: Quintin Buckle, DO;  Location: [facility] ENDOSCOPY;  Service: Gastroenterology;;   SUBMUCOSAL TATTOO INJECTION  08/23/2022   Procedure: SUBMUCOSAL TATTOO INJECTION;  Surgeon: Quintin Buckle, DO;  Location: [facility] ENDOSCOPY;  Service: Gastroenterology;;   TONSILLECTOMY     VENTRAL HERNIA REPAIR N/A  09/28/2022   Procedure: HERNIA REPAIR VENTRAL ADULT;  Surgeon: Alben Alma, MD;  Location: [facility] ORS;  Service: General;  Laterality: N/A;    SOCIAL HISTORY: Social History   Socioeconomic History   Marital status: Married    Spouse name: Not on file   Number of children: Not on file   Years of education: Not on file   Highest education level: 5th grade  Occupational History   Not on file  Tobacco Use   Smoking status: Former    Types: Cigarettes    Passive exposure: Past   Smokeless tobacco: Never   Tobacco comments:    Stop smoking iver 40  plus years ago  Vaping Use   Vaping status: Never Used  Substance and Sexual Activity   Alcohol use: No   Drug use: No   Sexual activity: Not Currently  Other Topics Concern   Not on file  Social History Narrative   Not on file   Social Drivers of Health   Financial Resource Strain: Patient Declined (04/26/2023)   Received from [facility]   Overall Financial Resource Strain (CARDIA)    Difficulty of Paying Living Expenses: Patient declined  Recent Concern: Financial Resource Strain -  Medium Risk (02/24/2023)   Received from [facility]   Overall Financial Resource Strain (CARDIA)    Difficulty of Paying Living Expenses: Somewhat hard  Food Insecurity: No Food Insecurity (06/23/2023)   Hunger Vital Sign    Worried About Running Out of Food in the Last Year: Never true    Ran Out of Food in the Last Year: Never true  Transportation Needs: No Transportation Needs (06/23/2023)   PRAPARE - Administrator, Civil Service (Medical): No    Lack of Transportation (Non-Medical): No  Physical Activity: Unknown (07/26/2022)   Exercise Vital Sign    Days of Exercise per Week: Patient declined    Minutes of Exercise per Session: Not on file  Stress: Stress Concern Present (07/26/2022)   [employer] of Occupational Health - Occupational Stress Questionnaire    Feeling of Stress : To some extent  Social Connections: Socially Integrated (06/23/2023)   Social Connection and Isolation Panel [NHANES]    Frequency of Communication with Friends and Family: More than three times a week    Frequency of Social Gatherings with Friends and Family: Patient declined    Attends Religious Services: More than 4 times per year    Active Member of [employer] or Organizations: Yes    Attends Banker Meetings: Never    Marital Status: Married  Catering manager Violence: Not At Risk (06/23/2023)   Humiliation, Afraid, Rape, and Kick questionnaire    Fear of Current or Ex-Partner: No    Emotionally Abused: No    Physically Abused: No    Sexually Abused: No    FAMILY HISTORY: Family History  Problem Relation Age of Onset   Cancer Maternal Aunt    ADD / ADHD Daughter    Asthma Son    Prostate cancer Neg Hx    Kidney disease Neg Hx    Kidney cancer Neg Hx    Bladder Cancer Neg Hx     ALLERGIES:  has no known allergies.  MEDICATIONS:  Current Outpatient Medications  Medication Sig Dispense Refill   acetaminophen  (TYLENOL ) 325 MG tablet Take 2 tablets  (650 mg total) by mouth every 6 (six) hours as needed for mild pain (pain score 1-3) or fever.  albuterol  (VENTOLIN  HFA) 108 (90 Base) MCG/ACT inhaler Inhale 2 puffs into the lungs every 4 (four) hours as needed for wheezing or shortness of breath.     aspirin  EC 81 MG tablet Take 1 tablet (81 mg total) by mouth daily. Swallow whole.     benzonatate (TESSALON) 100 MG capsule Take 100-200 mg by mouth 3 (three) times daily as needed.     empagliflozin  (JARDIANCE ) 10 MG TABS tablet Take 1 tablet (10 mg total) by mouth daily before breakfast. 90 tablet 2   fluticasone -salmeterol (ADVAIR) 250-50 MCG/ACT AEPB Inhale 1 puff into the lungs in the morning and at bedtime.     furosemide  (LASIX ) 20 MG tablet Take 20 mg by mouth daily as needed.     hydrALAZINE  (APRESOLINE ) 50 MG tablet Take 2 tablets (100 mg total) by mouth 2 (two) times daily. 180 tablet 3   ipratropium (ATROVENT) 0.03 % nasal spray Place 2 sprays into the nose 2 (two) times daily.     levothyroxine  (SYNTHROID ) 200 MCG tablet Take 1 tablet (200 mcg total) by mouth daily before breakfast. 90 tablet 0   linezolid  (ZYVOX ) 600 MG tablet Take 1 tablet (600 mg total) by mouth 2 (two) times daily for 14 days. 28 tablet 0   metFORMIN  (GLUCOPHAGE ) 500 MG tablet Take 2 tablets (1,000 mg total) by mouth 2 (two) times daily with a meal. 120 tablet 0   metoprolol  succinate (TOPROL  XL) 25 MG 24 hr tablet Take 1 tablet (25 mg total) by mouth daily. 90 tablet 1   oxyCODONE -acetaminophen  (PERCOCET) 10-325 MG tablet Take 1 tablet by mouth every 8 (eight) hours as needed for pain. Must last 30 days. 90 tablet 0   [START ON 07/17/2023] oxyCODONE -acetaminophen  (PERCOCET) 10-325 MG tablet Take 1 tablet by mouth every 8 (eight) hours as needed for pain. Must last 30 days. 90 tablet 0   [START ON 08/16/2023] oxyCODONE -acetaminophen  (PERCOCET) 10-325 MG tablet Take 1 tablet by mouth every 8 (eight) hours as needed for pain. Must last 30 days. 90 tablet 0    rosuvastatin  (CRESTOR ) 40 MG tablet Take 1 tablet (40 mg total) by mouth daily. 90 tablet 1   sacubitril -valsartan  (ENTRESTO ) 97-103 MG Take 1 tablet by mouth 2 (two) times daily. 120 tablet 3   spironolactone  (ALDACTONE ) 25 MG tablet Take 0.5 tablets (12.5 mg total) by mouth daily. 45 tablet 3   [Paused] zanubrutinib  (BRUKINSA ) 80 MG capsule Take 2 capsules (160 mg total) by mouth 2 (two) times daily. (Patient not taking: Reported on 07/06/2023) 120 capsule 2   No current facility-administered medications for this visit.    Review of Systems  Constitutional:  Positive for fatigue. Negative for appetite change, chills, fever and unexpected weight change.  HENT:   Negative for hearing loss and voice change.   Eyes:  Negative for eye problems and icterus.  Respiratory:  Negative for chest tightness, cough and shortness of breath.   Cardiovascular:  Negative for chest pain and leg swelling.  Gastrointestinal:  Negative for abdominal distention and abdominal pain.  Endocrine: Negative for hot flashes.  Genitourinary:  Negative for difficulty urinating, dysuria and frequency.   Musculoskeletal:  Positive for arthralgias.  Skin:  Negative for itching and rash.  Neurological:  Negative for light-headedness and numbness.  Hematological:  Negative for adenopathy. Does not bruise/bleed easily.  Psychiatric/Behavioral:  Negative for confusion.      PHYSICAL EXAMINATION: ECOG PERFORMANCE STATUS: 1 - Symptomatic but completely ambulatory  Vitals:   07/06/23 1112 07/06/23  1122  BP: (!) 151/85 (!) 148/80  Pulse: (!) 59   Resp: 18   Temp: (!) 96 F (35.6 C)   SpO2: 99%    Filed Weights   07/06/23 1112  Weight: 220 lb 3.2 oz (99.9 kg)    Physical Exam Constitutional:      General: He is not in acute distress.    Appearance: He is obese. He is not diaphoretic.  HENT:     Head: Normocephalic and atraumatic.  Eyes:     General: No scleral icterus.    Pupils: Pupils are equal, round, and  reactive to light.  Cardiovascular:     Rate and Rhythm: Normal rate.     Heart sounds: No murmur heard. Pulmonary:     Effort: Pulmonary effort is normal. No respiratory distress.     Breath sounds: No wheezing.  Abdominal:     General: There is no distension.     Palpations: Abdomen is soft.  Genitourinary:    Comments:   Musculoskeletal:     Cervical back: Normal range of motion and neck supple.  Skin:    General: Skin is warm and dry.     Findings: No erythema.  Neurological:     Mental Status: He is alert and oriented to person, place, and time. Mental status is at baseline.     Cranial Nerves: No cranial nerve deficit.     Motor: No abnormal muscle tone.  Psychiatric:        Mood and Affect: Mood and affect normal.     LABORATORY DATA:  I have reviewed the data as listed    Latest Ref Rng & Units 06/29/2023   11:36 AM 06/26/2023    5:07 AM 06/24/2023    4:46 AM  CBC  WBC 4.0 - 10.5 K/uL 5.3  7.0  9.6   Hemoglobin 13.0 - 17.0 g/dL 9.4  9.5  8.7   Hematocrit 39.0 - 52.0 % 30.1  30.0  27.4   Platelets 150 - 400 K/uL 228  171  140       Latest Ref Rng & Units 06/29/2023   11:36 AM 06/27/2023    2:52 AM 06/26/2023    5:07 AM  CMP  Glucose 70 - 99 mg/dL 027  253  664   BUN 8 - 23 mg/dL 10  12  16    Creatinine 0.61 - 1.24 mg/dL 4.03  4.74  2.59   Sodium 135 - 145 mmol/L 137  138  138   Potassium 3.5 - 5.1 mmol/L 3.5  3.5  3.9   Chloride 98 - 111 mmol/L 108  110  109   CO2 22 - 32 mmol/L 21  22  23    Calcium  8.9 - 10.3 mg/dL 8.1  8.0  8.0   Total Protein 6.5 - 8.1 g/dL 6.5     Total Bilirubin 0.0 - 1.2 mg/dL 0.6     Alkaline Phos 38 - 126 U/L 83     AST 15 - 41 U/L 29     ALT 0 - 44 U/L 18        RADIOGRAPHIC STUDIES: I have personally reviewed the radiological images as listed and agreed with the findings in the report. IR US  Guide Bx Asp/Drain Result Date: 06/27/2023 INDICATION: Lateral knee patellar region small fluid collection, concern for infection EXAM:  ULTRASOUND ASPIRATION RIGHT LATERAL KNEE SUPERFICIAL FLUID COLLECTION MEDICATIONS: 1% LIDOCAINE  LOCAL ANESTHESIA/SEDATION: None. COMPLICATIONS: None immediate. PROCEDURE: Informed written consent was obtained from the  patient after a thorough discussion of the procedural risks, benefits and alternatives. All questions were addressed. Maximal Sterile Barrier Technique was utilized including caps, mask, sterile gowns, sterile gloves, sterile drape, hand hygiene and skin antiseptic. A timeout was performed prior to the initiation of the procedure. Previous imaging reviewed. Preliminary ultrasound performed. The right lateral knee small fluid collection was localized and marked with ultrasound. Under sterile conditions and local anesthesia, an 18 gauge needle was advanced into the small fluid collection. Syringe aspiration yielded 3 cc exudative blood tinged pink fluid. Sample sent for cultures. Needle removed. Postprocedure imaging demonstrates no hemorrhage or hematoma. Patient tolerated biopsy well. IMPRESSION: Successful ultrasound right lateral knee superficial fluid collection aspiration. Electronically Signed   By: Melven Stable.  Shick M.D.   On: 06/27/2023 12:33   US  RT LOWER EXTREM LTD SOFT TISSUE NON VASCULAR Result Date: 06/25/2023 CLINICAL DATA:  Right knee pain and swelling. Evaluate for drainable fluid collection EXAM: ULTRASOUND RIGHT LOWER EXTREMITY LIMITED TECHNIQUE: Ultrasound examination of the lower extremity soft tissues was performed in the area of clinical concern in the region of the right knee. COMPARISON:  None Available. FINDINGS: A fluid collection with irregular margins is seen in the anterior and lateral soft tissues, just above the patella. This measures 5.2 x 1.4 by 4.5 cm. IMPRESSION: Focal fluid collection in the anterior and lateral soft tissues of the right knee, just above the patella. Electronically Signed   By: Marlyce Sine M.D.   On: 06/25/2023 17:29   US  Venous Img Lower Unilateral  Right (DVT) Result Date: 06/25/2023 CLINICAL DATA:  Right lower extremity edema EXAM: RIGHT LOWER EXTREMITY VENOUS DOPPLER ULTRASOUND TECHNIQUE: Gray-scale sonography with compression, as well as color and duplex ultrasound, were performed to evaluate the deep venous system(s) from the level of the common femoral vein through the popliteal and proximal calf veins. COMPARISON:  None Available. FINDINGS: VENOUS Normal compressibility of the common femoral, superficial femoral, and popliteal veins, as well as the visualized calf veins. Visualized portions of profunda femoral vein and great saphenous vein unremarkable. No filling defects to suggest DVT on grayscale or color Doppler imaging. Doppler waveforms show normal direction of venous flow, normal respiratory plasticity and response to augmentation. Limited views of the contralateral common femoral vein are unremarkable. OTHER Mildly prominent superficial inguinal lymph nodes. Limitations: none IMPRESSION: 1. Negative for acute DVT. 2. Mildly prominent superficial inguinal lymph nodes are likely reactive. Electronically Signed   By: Fernando Hoyer M.D.   On: 06/25/2023 09:25   DG Knee Complete 4 Views Right Result Date: 06/23/2023 CLINICAL DATA:  Right knee swelling EXAM: RIGHT KNEE - COMPLETE 4+ VIEW COMPARISON:  01/01/2022 FINDINGS: No fracture or malalignment. Joint space calcifications consistent with chondrocalcinosis. Small knee effusion. Mild tricompartment arthritis of the knee. Vascular calcifications IMPRESSION: Mild tricompartment arthritis with chondrocalcinosis and small knee effusion. Electronically Signed   By: Esmeralda Hedge M.D.   On: 06/23/2023 19:31   NM PET CT MYOCARDIAL SARCOIDOSIS Result Date: 06/23/2023   FDG uptake findings are inconsistent with active myocardial inflammation/sarcoidosis.   FDG uptake was not observed. LV perfusion is abnormal. Defect 1: There is a small defect with moderate reduction in uptake present in the basal  inferolateral location(s). FDG uptake was not present in the described defect segment(s). There is abnormal wall motion in the defect area. Perfusion defect is in similar location to the LGE seen on MRI. Findings appear inconsistent with active cardiac sarcoidosis, though findings may represent fibrotic stage of cardiac sarcoid  if clinical concern persists.   Left ventricular function is abnormal. EF: 21%. End diastolic cavity size is severely enlarged. End systolic cavity size is severely enlarged.   Coronary calcium  was present on the attenuation correction CT images. Mild coronary calcifications were present. Coronary calcifications were present in the left anterior descending artery distribution(s). Aortic valve calcifications.   Electronically signed by: Euell Herrlich, MD CLINICAL DATA:  This over-read does not include interpretation of cardiac or coronary anatomy or pathology. The cardiac PET interpretation by the cardiologist is attached. COMPARISON:  04/27/2023 CTA chest FINDINGS: No pleural fluid. Motion degradation. Right lower lobe 5 mm pulmonary nodule on 07/03 can be presumed benign and do/does not warrant imaging follow-up per Fleischner criteria. Suspect smaller right upper lobe subpleural pulmonary nodules which are of doubtful clinical significance. Aortic atherosclerosis. Pulmonary artery enlargement, outflow tract 3.5 cm. No imaged thoracic adenopathy. Normal imaged portions of the liver, spleen, stomach, pancreas, adrenal glands. Old left rib fractures. IMPRESSION: 1. Motion degradation. 2.  No acute findings in the imaged extracardiac chest. 3. Pulmonary artery enlargement suggests pulmonary arterial hypertension. 4.  Aortic Atherosclerosis (ICD10-I70.0). Electronically Signed   By: Lore Rode M.D.   On: 06/23/2023 13:39

## 2023-07-08 ENCOUNTER — Emergency Department

## 2023-07-08 ENCOUNTER — Other Ambulatory Visit: Payer: Self-pay

## 2023-07-08 ENCOUNTER — Emergency Department
Admission: EM | Admit: 2023-07-08 | Discharge: 2023-07-08 | Disposition: A | Discharge status: Home / Self Care | Attending: Emergency Medicine | Admitting: Emergency Medicine

## 2023-07-08 DIAGNOSIS — M4802 Spinal stenosis, cervical region: Secondary | ICD-10-CM | POA: Diagnosis not present

## 2023-07-08 DIAGNOSIS — W228XXA Striking against or struck by other objects, initial encounter: Secondary | ICD-10-CM | POA: Diagnosis not present

## 2023-07-08 DIAGNOSIS — I130 Hypertensive heart and chronic kidney disease with heart failure and stage 1 through stage 4 chronic kidney disease, or unspecified chronic kidney disease: Secondary | ICD-10-CM | POA: Diagnosis not present

## 2023-07-08 DIAGNOSIS — J449 Chronic obstructive pulmonary disease, unspecified: Secondary | ICD-10-CM | POA: Insufficient documentation

## 2023-07-08 DIAGNOSIS — Z856 Personal history of leukemia: Secondary | ICD-10-CM | POA: Diagnosis not present

## 2023-07-08 DIAGNOSIS — M25511 Pain in right shoulder: Secondary | ICD-10-CM | POA: Insufficient documentation

## 2023-07-08 DIAGNOSIS — N1831 Chronic kidney disease, stage 3a: Secondary | ICD-10-CM | POA: Insufficient documentation

## 2023-07-08 DIAGNOSIS — S0990XA Unspecified injury of head, initial encounter: Secondary | ICD-10-CM | POA: Diagnosis not present

## 2023-07-08 DIAGNOSIS — S80211A Abrasion, right knee, initial encounter: Secondary | ICD-10-CM | POA: Insufficient documentation

## 2023-07-08 DIAGNOSIS — Y990 Civilian activity done for income or pay: Secondary | ICD-10-CM | POA: Diagnosis not present

## 2023-07-08 DIAGNOSIS — S8991XA Unspecified injury of right lower leg, initial encounter: Secondary | ICD-10-CM | POA: Diagnosis not present

## 2023-07-08 DIAGNOSIS — M25561 Pain in right knee: Secondary | ICD-10-CM | POA: Diagnosis not present

## 2023-07-08 DIAGNOSIS — M47812 Spondylosis without myelopathy or radiculopathy, cervical region: Secondary | ICD-10-CM | POA: Diagnosis not present

## 2023-07-08 DIAGNOSIS — I5022 Chronic systolic (congestive) heart failure: Secondary | ICD-10-CM | POA: Insufficient documentation

## 2023-07-08 DIAGNOSIS — E039 Hypothyroidism, unspecified: Secondary | ICD-10-CM | POA: Insufficient documentation

## 2023-07-08 DIAGNOSIS — M1711 Unilateral primary osteoarthritis, right knee: Secondary | ICD-10-CM | POA: Diagnosis not present

## 2023-07-08 DIAGNOSIS — M47811 Spondylosis without myelopathy or radiculopathy, occipito-atlanto-axial region: Secondary | ICD-10-CM | POA: Diagnosis not present

## 2023-07-08 DIAGNOSIS — M19011 Primary osteoarthritis, right shoulder: Secondary | ICD-10-CM | POA: Diagnosis not present

## 2023-07-08 DIAGNOSIS — E1122 Type 2 diabetes mellitus with diabetic chronic kidney disease: Secondary | ICD-10-CM | POA: Diagnosis not present

## 2023-07-08 DIAGNOSIS — W19XXXA Unspecified fall, initial encounter: Secondary | ICD-10-CM

## 2023-07-08 MED ORDER — ACETAMINOPHEN 325 MG PO TABS
650.0000 mg | ORAL_TABLET | Freq: Once | ORAL | Status: AC
  Administered 2023-07-08: 650 mg via ORAL
  Filled 2023-07-08: qty 2

## 2023-07-08 NOTE — ED Triage Notes (Signed)
 Pt comes in via pov with complaints right knee, arm and shoulder pain. Pt states that he works in [employer], and this morning was struck by some equipment knocking him to the ground. Pt struck is right knee, arm and shoulder. Pt is alert and oriented with no signs of acute distress at this time.

## 2023-07-08 NOTE — Discharge Instructions (Signed)
 Your x-rays and CT scans are normal. You may use the sling for comfort but remember to talk your arm out multiple times per day to avoid frozen shoulder. Return for new, worsening, or change in symptoms or other concerns. It was a pleasure caring for you today.

## 2023-07-08 NOTE — ED Provider Notes (Signed)
 [facility] Provider Note    Event Date/Time   First MD Initiated Contact with Patient 07/08/23 1042     (approximate)   History   Knee Pain   HPI  Paul Sheeler [name] is a 78 y.o. male with a past medical history of type 2 diabetes, CLL, obesity, CHF, COPD, CKD who presents today for evaluation of right shoulder pain and right knee pain.  Patient reports that he was hit by a cement slide at work and this knocked him on the ground.  He reports that he landed on his right knee and his right shoulder and this is where he has pain.  He denies head strike or LOC.  He reports that he has pain with trying to move his shoulder or his knee.  No numbness or tingling.  No weakness.  He has been able to ambulate.  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   Postoperative surgical complication involving skin associated with non-dermatologic procedure 10/26/2022   Mass of colon 09/28/2022   Tubular adenoma of colon 09/03/2022   Fall 08/27/2022   Abscess of left groin 08/27/2022   Elevated troponin 08/26/2022   Contusion of groin, left, initial encounter 08/26/2022   Chronic, continuous use of opioids 08/26/2022   Cardiomyopathy (HCC) 08/26/2022   Acute on chronic anemia 08/20/2022   Cough 08/06/2022   Transaminitis 05/21/2022   Chronic right shoulder pain 02/25/2022   Cervical radicular pain 02/25/2022   Encounter for antineoplastic  chemotherapy 02/06/2022   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/01/2022   IDA (iron  deficiency anemia) 01/01/2022   Right ankle pain 12/25/2021   Pneumonia 12/25/2021   Type II diabetes mellitus (HCC) 12/25/2021   Acute renal failure superimposed on stage 3a chronic kidney disease (HCC) 12/25/2021   HLD (hyperlipidemia) 12/25/2021   HTN (hypertension) 12/25/2021   Obesity with body mass index (BMI) of 30.0 to 39.9 12/25/2021   Right knee pain 12/25/2021   Septic infrapatellar bursitis of right knee 12/25/2021   Class 1 obesity without serious comorbidity with body mass index (BMI) of 30.0 to 30.9 in adult 10/02/2021   Encounter for long-term opiate analgesic use 06/04/2021   Chronic radicular lumbar pain 04/07/2021   Lumbar facet arthropathy 04/07/2021   Lumbar degenerative disc disease 04/07/2021   Spinal stenosis, lumbar region, with neurogenic claudication 11/06/2020   Chronic left shoulder pain 11/06/2020   Chronic pain syndrome 11/06/2020   Pain management contract signed 11/06/2020   CPAP use counseling 08/25/2020   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   Adrenal nodule (HCC) 10/28/2015   Sepsis (HCC) 07/19/2015   Essential hypertension 07/19/2015   Diabetes mellitus (HCC) 07/19/2015   Hypothyroidism 07/19/2015   Chronic back pain 07/19/2015   CKD (chronic kidney disease) stage 3, GFR 30-59 ml/min (HCC) 07/19/2015   Enlarged prostate 03/25/2015   Urine stream spraying 03/25/2015   BPH with obstruction/lower urinary tract symptoms 09/18/2014   Other male erectile dysfunction 09/18/2014   Sleep apnea 04/04/2014  Physical Exam   Triage Vital Signs: ED Triage Vitals  Encounter Vitals Group     BP 07/08/23 1011 (!) 145/97     Girls Systolic BP Percentile --      Girls Diastolic BP Percentile --      Boys Systolic BP Percentile --      Boys Diastolic BP Percentile --      Pulse Rate 07/08/23 1011 71     Resp  07/08/23 1011 18     Temp 07/08/23 1011 98.8 F (37.1 C)     Temp src --      SpO2 07/08/23 1011 100 %     Weight 07/08/23 1012 220 lb 3.8 oz (99.9 kg)     Height 07/08/23 1012 6' 1 (1.854 m)     Head Circumference --      Peak Flow --      Pain Score 07/08/23 1012 10     Pain Loc --      Pain Education --      Exclude from Growth Chart --     Most recent vital signs: Vitals:   07/08/23 1011  BP: (!) 145/97  Pulse: 71  Resp: 18  Temp: 98.8 F (37.1 C)  SpO2: 100%    Physical Exam Vitals and nursing note reviewed.  Constitutional:      General: Awake and alert. No acute distress.    Appearance: Normal appearance. The patient is normal weight.  HENT:     Head: Normocephalic and atraumatic.     Mouth: Mucous membranes are moist.  Eyes:     General: PERRL. Normal EOMs        Right eye: No discharge.        Left eye: No discharge.     Conjunctiva/sclera: Conjunctivae normal.  Cardiovascular:     Rate and Rhythm: Normal rate and regular rhythm.     Pulses: Normal pulses.  Pulmonary:     Effort: Pulmonary effort is normal. No respiratory distress.     Breath sounds: Normal breath sounds.  Abdominal:     Abdomen is soft. There is no abdominal tenderness. No rebound or guarding. No distention. Musculoskeletal:        General: No swelling. Normal range of motion.     Cervical back: Normal range of motion and neck supple.  Tenderness palpation to right trapezius muscle area.  No midline cervical spine tenderness.  Full range of motion of neck.  Negative Spurling test.  Negative Lhermitte sign.  Normal strength and sensation in bilateral upper extremities. Normal grip strength bilaterally.  Normal intrinsic muscle function of the hand bilaterally.  Normal radial pulses bilaterally. Right knee: no deformity.  Superficial abrasion noted to anterior knee.  Mild anterior joint line tenderness. No patellar tenderness, no ballotment Warm and well perfused extremity with 2+ pedal  pulses 5/5 strength to dorsiflexion and plantarflexion at the ankle with intact sensation throughout extremity Able to flex and extend knee, though pain with range of motion.  Extensor mechanism intact. No ligamentous laxity.  No effusion or warmth Intact quadriceps, hamstring function, patellar tendon function Pelvis stable Full ROM of ankle without pain or swelling Foot warm and well perfused Right shoulder: No obvious deformity, swelling, ecchymosis, or erythema No clavicular or AC joint tenderness Minimal Able to actively and passively forward flex and abduct at shoulder fully, negative drop arm test Negative Obriens, SLAP, empty can, and lift off tests Normal internal and external rotation against resistance Negative Hawkins and Neers  Normal ROM at elbow and wrist Normal resisted pronation and supination 2+ radial pulse Normal grip strength Normal intrinsic hand muscle function Skin:    General: Skin is warm and dry.     Capillary Refill: Capillary refill takes less than 2 seconds.     Findings: No rash.  Neurological:     Mental Status: The patient is awake and alert.   Neurological: GCS 15 alert and oriented x3 Normal speech, no expressive or receptive aphasia or dysarthria Cranial nerves II through XII intact Normal visual fields 5 out of 5 strength in all 4 extremities with intact sensation throughout No extremity drift Normal finger-to-nose testing, no limb or truncal ataxia    ED Results / Procedures / Treatments   Labs (all labs ordered are listed, but only abnormal results are displayed) Labs Reviewed - No data to display   EKG     RADIOLOGY I independently reviewed and interpreted imaging and agree with radiologists findings.     PROCEDURES:  Critical Care performed:   Procedures   MEDICATIONS ORDERED IN ED: Medications  acetaminophen  (TYLENOL ) tablet 650 mg (650 mg Oral Given 07/08/23 1113)     IMPRESSION / MDM / ASSESSMENT AND PLAN /  ED COURSE  I reviewed the triage vital signs and the nursing notes.   Differential diagnosis includes, but is not limited to, fracture, dislocation, rotator cuff injury, intracranial hemorrhage, cervical spine injury.  I reviewed the patient's chart.  Patient had a recent admission for septic bursitis of his right knee.  Patient is awake and alert, hemodynamically stable and afebrile.  He is able to ambulate with a steady gait.  X-rays obtained of his knee and shoulder are negative for any acute findings.  CT head and neck obtained per [place] criteria are negative for any acute injuries.  Patient was reassured by these results.  We discussed symptomatic management and return precautions.  He was given a sling for comfort, though advised to remove this to perform gentle range of motion exercises which were demonstrated for him to avoid adhesive capsulitis.  Patient understands and agrees the plan.  He was discharged in stable condition in the care of his daughter.  Patient's presentation is most consistent with acute complicated illness / injury requiring diagnostic workup.    FINAL CLINICAL IMPRESSION(S) / ED DIAGNOSES   Final diagnoses:  Acute pain of right knee  Acute pain of right shoulder  Fall, initial encounter     Rx / DC Orders   ED Discharge Orders     None        Note:  This document was prepared using Dragon voice recognition software and may include unintentional dictation errors.   Anila Bojarski E, PA-C 07/08/23 Paul Briggs    Paul Carbine, MD 07/11/23 [phone]

## 2023-07-13 ENCOUNTER — Inpatient Hospital Stay

## 2023-07-13 VITALS — BP 144/72 | HR 59 | Temp 96.7°F | Resp 18

## 2023-07-13 DIAGNOSIS — C9110 Chronic lymphocytic leukemia of B-cell type not having achieved remission: Secondary | ICD-10-CM | POA: Diagnosis not present

## 2023-07-13 MED ORDER — IRON SUCROSE 20 MG/ML IV SOLN
200.0000 mg | Freq: Once | INTRAVENOUS | Status: AC
  Administered 2023-07-13: 200 mg via INTRAVENOUS

## 2023-07-15 ENCOUNTER — Other Ambulatory Visit: Payer: Self-pay

## 2023-07-15 DIAGNOSIS — R5383 Other fatigue: Secondary | ICD-10-CM | POA: Diagnosis not present

## 2023-07-15 DIAGNOSIS — E119 Type 2 diabetes mellitus without complications: Secondary | ICD-10-CM | POA: Diagnosis not present

## 2023-07-15 DIAGNOSIS — E039 Hypothyroidism, unspecified: Secondary | ICD-10-CM | POA: Diagnosis not present

## 2023-07-15 DIAGNOSIS — C9110 Chronic lymphocytic leukemia of B-cell type not having achieved remission: Secondary | ICD-10-CM | POA: Diagnosis not present

## 2023-07-15 DIAGNOSIS — E785 Hyperlipidemia, unspecified: Secondary | ICD-10-CM | POA: Diagnosis not present

## 2023-07-15 DIAGNOSIS — M899 Disorder of bone, unspecified: Secondary | ICD-10-CM | POA: Diagnosis not present

## 2023-07-15 DIAGNOSIS — M25561 Pain in right knee: Secondary | ICD-10-CM | POA: Diagnosis not present

## 2023-07-15 DIAGNOSIS — M25511 Pain in right shoulder: Secondary | ICD-10-CM | POA: Diagnosis not present

## 2023-07-18 ENCOUNTER — Other Ambulatory Visit: Payer: Self-pay

## 2023-07-20 ENCOUNTER — Inpatient Hospital Stay

## 2023-07-21 ENCOUNTER — Other Ambulatory Visit: Payer: Self-pay | Admitting: Family Medicine

## 2023-07-21 DIAGNOSIS — M899 Disorder of bone, unspecified: Secondary | ICD-10-CM

## 2023-07-24 ENCOUNTER — Ambulatory Visit
Admission: RE | Admit: 2023-07-24 | Discharge: 2023-07-24 | Disposition: A | Discharge status: Home / Self Care | Source: Ambulatory Visit | Attending: Family Medicine | Admitting: Family Medicine

## 2023-07-24 DIAGNOSIS — M899 Disorder of bone, unspecified: Secondary | ICD-10-CM | POA: Diagnosis not present

## 2023-07-24 DIAGNOSIS — M4802 Spinal stenosis, cervical region: Secondary | ICD-10-CM | POA: Diagnosis not present

## 2023-07-24 DIAGNOSIS — M4803 Spinal stenosis, cervicothoracic region: Secondary | ICD-10-CM | POA: Diagnosis not present

## 2023-07-24 MED ORDER — GADOBUTROL 1 MMOL/ML IV SOLN
10.0000 mL | Freq: Once | INTRAVENOUS | Status: AC | PRN
  Administered 2023-07-24: 10 mL via INTRAVENOUS

## 2023-08-01 DIAGNOSIS — H40003 Preglaucoma, unspecified, bilateral: Secondary | ICD-10-CM | POA: Diagnosis not present

## 2023-08-03 DIAGNOSIS — M25511 Pain in right shoulder: Secondary | ICD-10-CM | POA: Diagnosis not present

## 2023-08-03 DIAGNOSIS — E039 Hypothyroidism, unspecified: Secondary | ICD-10-CM | POA: Diagnosis not present

## 2023-08-03 DIAGNOSIS — M48062 Spinal stenosis, lumbar region with neurogenic claudication: Secondary | ICD-10-CM | POA: Diagnosis not present

## 2023-08-03 DIAGNOSIS — E119 Type 2 diabetes mellitus without complications: Secondary | ICD-10-CM | POA: Diagnosis not present

## 2023-08-03 DIAGNOSIS — C9110 Chronic lymphocytic leukemia of B-cell type not having achieved remission: Secondary | ICD-10-CM | POA: Diagnosis not present

## 2023-08-03 DIAGNOSIS — N1831 Chronic kidney disease, stage 3a: Secondary | ICD-10-CM | POA: Diagnosis not present

## 2023-08-03 DIAGNOSIS — M899 Disorder of bone, unspecified: Secondary | ICD-10-CM | POA: Diagnosis not present

## 2023-08-03 DIAGNOSIS — I10 Essential (primary) hypertension: Secondary | ICD-10-CM | POA: Diagnosis not present

## 2023-08-03 DIAGNOSIS — M4802 Spinal stenosis, cervical region: Secondary | ICD-10-CM | POA: Diagnosis not present

## 2023-08-03 DIAGNOSIS — E785 Hyperlipidemia, unspecified: Secondary | ICD-10-CM | POA: Diagnosis not present

## 2023-08-05 ENCOUNTER — Other Ambulatory Visit: Payer: Self-pay

## 2023-08-08 DIAGNOSIS — H43813 Vitreous degeneration, bilateral: Secondary | ICD-10-CM | POA: Diagnosis not present

## 2023-08-08 DIAGNOSIS — E119 Type 2 diabetes mellitus without complications: Secondary | ICD-10-CM | POA: Diagnosis not present

## 2023-08-08 DIAGNOSIS — H40003 Preglaucoma, unspecified, bilateral: Secondary | ICD-10-CM | POA: Diagnosis not present

## 2023-08-08 DIAGNOSIS — H2513 Age-related nuclear cataract, bilateral: Secondary | ICD-10-CM | POA: Diagnosis not present

## 2023-08-09 ENCOUNTER — Telehealth: Payer: Self-pay

## 2023-08-09 DIAGNOSIS — C9110 Chronic lymphocytic leukemia of B-cell type not having achieved remission: Secondary | ICD-10-CM

## 2023-08-09 DIAGNOSIS — M899 Disorder of bone, unspecified: Secondary | ICD-10-CM

## 2023-08-09 DIAGNOSIS — E039 Hypothyroidism, unspecified: Secondary | ICD-10-CM | POA: Diagnosis not present

## 2023-08-09 NOTE — Telephone Encounter (Signed)
 Request for BM bx sent to IR. Date pending

## 2023-08-09 NOTE — Telephone Encounter (Signed)
-----   Message from Zelphia Cap sent at 08/09/2023  8:32 AM EDT ----- Regarding: RE: REFERRAL- [facility] I have reviewed his cervical MRI. Please arrange him to get bone marrow biopsy ASAP. Please adjust his follow up with me to be 1 week after BM biopsy. Also add myeloma panel, light chain ratio to his blood work scheduled to be done with his next appt. Thanks.   Zhou ----- Message ----- From: Caro Rosina HERO Sent: 08/08/2023   3:39 PM EDT To: Almarie JINNY Nett, RN; Zelphia Cap, MD Subject: EMELDA: REFERRAL- [facility]         ----- Message ----- From: Melba Grayce MATSU Sent: 08/08/2023   3:32 PM EDT To: Rosina HERO Caro Subject: FW: REFERRAL- [facility]        Did Dr Cap sent a msg on this. Im not sure . His next appt is 08/24/2022 and that appt doeasnt say anything about his new problem. Just don't want it missed. The referring office is asking when his appt is and I don't want to fax them back unless I know that next appt date is ok. ----- Message ----- From: Melba Grayce MATSU Sent: 08/03/2023   3:46 PM EDT To: Almarie JINNY Nett, RN; Fonda MATSU Sax, CMA# Subject: REFERRAL- [facility]            REFERRAL- [facility] sent to his chart. Dr Cap, Dr Selinda Quan is referring for Bone Lesion. His next visit is 7/30 2024 to see you.  Please advise if he needs to be seen sooner.

## 2023-08-10 ENCOUNTER — Encounter: Payer: Self-pay | Admitting: Oncology

## 2023-08-12 NOTE — Progress Notes (Deleted)
 Referring Physician:  Cyrus Selinda Moose, PA-C [address] [place],  [place] [address]  Primary Physician:  Cyrus Selinda Moose, PA-C  History of Present Illness: 08/12/2023 Paul Briggs is here today with a chief complaint of ***  Neck pain Arm pain? Weakness or numbness?  Duration: *** Location: *** Quality: *** Severity: ***  Precipitating: aggravated by *** Modifying factors: made better by *** Weakness: none Timing: *** Bowel/Bladder Dysfunction: none  Conservative measures:  Physical therapy: *** has not participated in PT for neck pain? Multimodal medical therapy including regular antiinflammatories: *** Tylenol , Oxycodone , Lyrica  Injections:  Left C7-T1 IL ESI 06/16/22 (no help)  L5-S1 ESI 06/15/21 L5-S1 ESI 04/15/21 Left shoulder injection 12/24/20 Left shoulder injection 11/10/20  Past Surgery: *** Back surgery? What kind and when?  Paul Nadara Browner [name] has ***no symptoms of cervical myelopathy.  The symptoms are causing a significant impact on the patient's life.   I have utilized the care everywhere function in epic to review the outside records available from external health systems.  Review of Systems:  A 10 point review of systems is negative, except for the pertinent positives and negatives detailed in the HPI.  Past Medical History: Past Medical History:  Diagnosis Date   Adenoma of left adrenal gland    Anemia    Aortic atherosclerosis (HCC)    Blood transfusion without reported diagnosis    BPH (benign prostatic hyperplasia)    Bradycardia    CAD (coronary artery disease) 08/26/2022   a.) cCTA 08/26/2022: Ca2+ 14.3 (12th %'ile for age/sex.race match control); (<25%) pLAD   Cellulitis of left thigh 08/2022   Chronic pain syndrome    a.) on COT managed by pain management   Chronic, continuous use of opioids    a.) chronic pain syndrome/chronic back pain; managed by pain management   CKD (chronic kidney disease)  stage 3, GFR 30-59 ml/min (HCC)    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   a.) Rai stage III   DDD (degenerative disc disease), cervical    Difficult airway 10/22/2015   a.) 1st attempt with glidescope  --> macroglossic (unsuccessful); b.) 2nd attempt with Mac 4 and direct laryngoscopy with a 8.0 ETT --> tube too large to pass through the cords; b.) 3rd attempt (successful) with a Mac 4 and a 7.5 tube   Diverticulosis    DM (diabetes mellitus), type 2 (HCC)    Dysplasia of prostate    Erectile dysfunction    Frequent falls    Heart failure with mildly reduced ejection fraction (HFmrEF) (HCC)    a.) TTE 10/23/2015: EF 60-65%, mod-sev LVH, mild biatrial dil, degen MV disease, AoV sclerosis, asc Ao 38 mm, G1DD; b.) TTE 08/05/2022: EF 45-50%, mod-sev LVH (speckled pattern), sev biatrial dil, mild MR, AoV sclerosis, G1DD   Hepatic flexure mass 08/23/2022   a.) colonoscopy 08/23/2022: 14 x 15 mm partially obstructing ulcerated distal ascending colon mass; tissue friable --> pathology resulted as tubular adenoma, however felt to be false (-) --> referral to sugery and colectomy recommended; b.) s/p RIGHT hemicolectomy 09/28/2022   History of MRSA infection 08/28/2022   a.) MRSA PCR (+) 08/28/2022; culture from LEFT thigh abscess   HTN (hypertension)    Hyperlipidemia    Hypogonadism in male    Hypothyroidism    IDA (iron  deficiency anemia)    LBBB (left bundle branch block)    Long-term use of aspirin  therapy    Lumbar spinal stenosis    Mild  cardiomegaly    Multiple lung nodules on CT    NICM (nonischemic cardiomyopathy) (HCC)    a.) TTE 10/23/2015: EF 60-65; b.) TTE 08/05/2022: EF 45-50%   Orchitis and epididymitis 06/16/2013   OSA on CPAP    Palindromic rheumatism, hand    Pericardial effusion    Prostatitis    Sepsis (HCC) 08/2022   Subdural hematoma (HCC) 10/22/2015   a.) s/p traumatic mechanical fall --> CT head 10/22/2015: high-density SDH the left cerebral convexity  with13 mm midline shift --> s/p LEFT frontal burr hole craniotomy   Tubular adenoma of colon    Ventral hernia     Past Surgical History: Past Surgical History:  Procedure Laterality Date   BACK SURGERY     BIOPSY  08/21/2022   Procedure: BIOPSY;  Surgeon: Onita Elspeth Sharper, DO;  Location: [facility] ENDOSCOPY;  Service: Gastroenterology;;   BIOPSY  08/23/2022   Procedure: BIOPSY;  Surgeon: Onita Elspeth Sharper, DO;  Location: [facility] ENDOSCOPY;  Service: Gastroenterology;;   BIOPSY  02/28/2023   Procedure: BIOPSY;  Surgeon: Onita Elspeth Sharper, DO;  Location: [facility] ENDOSCOPY;  Service: Gastroenterology;;   SOLMON DAKIN OF CRANIUM Left 10/22/2015   COLON SURGERY  09/28/2022   COLONOSCOPY WITH PROPOFOL  N/A 08/23/2022   Procedure: COLONOSCOPY WITH PROPOFOL ;  Surgeon: Onita Elspeth Sharper, DO;  Location: [facility] ENDOSCOPY;  Service: Gastroenterology;  Laterality: N/A;   COLONOSCOPY WITH PROPOFOL  N/A 02/28/2023   Procedure: COLONOSCOPY WITH PROPOFOL ;  Surgeon: Onita Elspeth Sharper, DO;  Location: [facility] ENDOSCOPY;  Service: Gastroenterology;  Laterality: N/A;  DM   COLONOSCOPY WITH PROPOFOL  N/A 04/25/2023   Procedure: COLONOSCOPY WITH PROPOFOL ;  Surgeon: Onita Elspeth Sharper, DO;  Location: [facility] ENDOSCOPY;  Service: Gastroenterology;  Laterality: N/A;  DM   ESOPHAGOGASTRODUODENOSCOPY (EGD) WITH PROPOFOL  N/A 08/21/2022   Procedure: ESOPHAGOGASTRODUODENOSCOPY (EGD) WITH PROPOFOL ;  Surgeon: Onita Elspeth Sharper, DO;  Location: [facility] ENDOSCOPY;  Service: Gastroenterology;  Laterality: N/A;   ESOPHAGOGASTRODUODENOSCOPY (EGD) WITH PROPOFOL  N/A 02/28/2023   Procedure: ESOPHAGOGASTRODUODENOSCOPY (EGD) WITH PROPOFOL ;  Surgeon: Onita Elspeth Sharper, DO;  Location: [facility] ENDOSCOPY;  Service: Gastroenterology;  Laterality: N/A;   GIVENS CAPSULE STUDY N/A 08/23/2022   Procedure: GIVENS CAPSULE STUDY;  Surgeon: Onita Elspeth Sharper, DO;  Location: [facility] ENDOSCOPY;  Service: Gastroenterology;  Laterality: N/A;    HERNIA REPAIR     INGUINAL HERNIA REPAIR Right 03/09/2023   Procedure: HERNIA REPAIR INGUINAL ADULT, open, RNFA to assist;  Surgeon: Jordis Laneta FALCON, MD;  Location: [facility] ORS;  Service: General;  Laterality: Right;   IR US  GUIDE BX ASP/DRAIN  06/27/2023   IRRIGATION AND DEBRIDEMENT ABSCESS Left 08/28/2022   Procedure: IRRIGATION AND DEBRIDEMENT ABSCESS LEFT UPPER THIGH/GROIN;  Surgeon: Jordis Laneta FALCON, MD;  Location: [facility] ORS;  Service: General;  Laterality: Left;   LAPAROSCOPIC RIGHT COLECTOMY Right 09/28/2022   Procedure: LAPAROSCOPIC RIGHT COLECTOMY, RNFA to assist;  Surgeon: Jordis Laneta FALCON, MD;  Location: [facility] ORS;  Service: General;  Laterality: Right;   POLYPECTOMY  08/23/2022   Procedure: POLYPECTOMY;  Surgeon: Onita Elspeth Sharper, DO;  Location: [facility] ENDOSCOPY;  Service: Gastroenterology;;   POLYPECTOMY  04/25/2023   Procedure: POLYPECTOMY;  Surgeon: Onita Elspeth Sharper, DO;  Location: [facility] ENDOSCOPY;  Service: Gastroenterology;;   SUBMUCOSAL TATTOO INJECTION  08/23/2022   Procedure: SUBMUCOSAL TATTOO INJECTION;  Surgeon: Onita Elspeth Sharper, DO;  Location: [facility] ENDOSCOPY;  Service: Gastroenterology;;   TONSILLECTOMY     VENTRAL HERNIA REPAIR N/A 09/28/2022   Procedure: HERNIA REPAIR VENTRAL ADULT;  Surgeon: Jordis Laneta FALCON, MD;  Location: [facility] ORS;  Service: General;  Laterality: N/A;    Allergies: Allergies as of 08/18/2023   (No Known Allergies)    Medications:  Current Outpatient Medications:    acetaminophen  (TYLENOL ) 325 MG tablet, Take 2 tablets (650 mg total) by mouth every 6 (six) hours as needed for mild pain (pain score 1-3) or fever., Disp: , Rfl:    albuterol  (VENTOLIN  HFA) 108 (90 Base) MCG/ACT inhaler, Inhale 2 puffs into the lungs every 4 (four) hours as needed for wheezing or shortness of breath., Disp: , Rfl:    aspirin  EC 81 MG tablet, Take 1 tablet (81 mg total) by mouth daily. Swallow whole., Disp: , Rfl:    benzonatate (TESSALON) 100 MG capsule, Take 100-200  mg by mouth 3 (three) times daily as needed., Disp: , Rfl:    empagliflozin  (JARDIANCE ) 10 MG TABS tablet, Take 1 tablet (10 mg total) by mouth daily before breakfast., Disp: 90 tablet, Rfl: 2   fluticasone -salmeterol (ADVAIR) 250-50 MCG/ACT AEPB, Inhale 1 puff into the lungs in the morning and at bedtime., Disp: , Rfl:    furosemide  (LASIX ) 20 MG tablet, Take 20 mg by mouth daily as needed., Disp: , Rfl:    hydrALAZINE  (APRESOLINE ) 50 MG tablet, Take 2 tablets (100 mg total) by mouth 2 (two) times daily., Disp: 180 tablet, Rfl: 3   ipratropium (ATROVENT) 0.03 % nasal spray, Place 2 sprays into the nose 2 (two) times daily., Disp: , Rfl:    levothyroxine  (SYNTHROID ) 200 MCG tablet, Take 1 tablet (200 mcg total) by mouth daily before breakfast., Disp: 90 tablet, Rfl: 0   metFORMIN  (GLUCOPHAGE ) 500 MG tablet, Take 2 tablets (1,000 mg total) by mouth 2 (two) times daily with a meal., Disp: 120 tablet, Rfl: 0   metoprolol  succinate (TOPROL  XL) 25 MG 24 hr tablet, Take 1 tablet (25 mg total) by mouth daily., Disp: 90 tablet, Rfl: 1   oxyCODONE -acetaminophen  (PERCOCET) 10-325 MG tablet, Take 1 tablet by mouth every 8 (eight) hours as needed for pain. Must last 30 days., Disp: 90 tablet, Rfl: 0   [START ON 08/16/2023] oxyCODONE -acetaminophen  (PERCOCET) 10-325 MG tablet, Take 1 tablet by mouth every 8 (eight) hours as needed for pain. Must last 30 days., Disp: 90 tablet, Rfl: 0   rosuvastatin  (CRESTOR ) 40 MG tablet, Take 1 tablet (40 mg total) by mouth daily., Disp: 90 tablet, Rfl: 1   sacubitril -valsartan  (ENTRESTO ) 97-103 MG, Take 1 tablet by mouth 2 (two) times daily., Disp: 120 tablet, Rfl: 3   spironolactone  (ALDACTONE ) 25 MG tablet, Take 0.5 tablets (12.5 mg total) by mouth daily., Disp: 45 tablet, Rfl: 3   [Paused] zanubrutinib  (BRUKINSA ) 80 MG capsule, Take 2 capsules (160 mg total) by mouth 2 (two) times daily. (Patient not taking: Reported on 07/06/2023), Disp: 120 capsule, Rfl: 2  Social  History: Social History   Tobacco Use   Smoking status: Former    Types: Cigarettes    Passive exposure: Past   Smokeless tobacco: Never   Tobacco comments:    Stop smoking iver 40  plus years ago  Vaping Use   Vaping status: Never Used  Substance Use Topics   Alcohol use: No   Drug use: No    Family Medical History: Family History  Problem Relation Age of Onset   Cancer Maternal Aunt    ADD / ADHD Daughter    Asthma Son    Prostate cancer Neg Hx    Kidney disease Neg Hx    Kidney  cancer Neg Hx    Bladder Cancer Neg Hx     Physical Examination: There were no vitals filed for this visit.  General: Patient is in no apparent distress. Attention to examination is appropriate.  Neck:   Supple.  Full range of motion.  Respiratory: Patient is breathing without any difficulty.   NEUROLOGICAL:     Awake, alert, oriented to person, place, and time.  Speech is clear and fluent.   Cranial Nerves: Pupils equal round and reactive to light.  Facial tone is symmetric.  Facial sensation is symmetric. Shoulder shrug is symmetric. Tongue protrusion is midline.  There is no pronator drift.  Strength: Side Biceps Triceps Deltoid Interossei Grip Wrist Ext. Wrist Flex.  R 5 5 5 5 5 5 5   L 5 5 5 5 5 5 5    Side Iliopsoas Quads Hamstring PF DF EHL  R 5 5 5 5 5 5   L 5 5 5 5 5 5    Reflexes are ***2+ and symmetric at the biceps, triceps, brachioradialis, patella and achilles.   Hoffman's is absent.   Bilateral upper and lower extremity sensation is intact to light touch.    No evidence of dysmetria noted.  Gait is normal.     Medical Decision Making  Imaging: ***  I have personally reviewed the images and agree with the above interpretation.  Assessment and Plan: Paul Briggs is a pleasant 78 y.o. male with ***      Thank you for involving me in the care of this patient.      Chester K. Clois MD, [facility] Neurosurgery

## 2023-08-17 ENCOUNTER — Other Ambulatory Visit: Payer: Self-pay

## 2023-08-17 ENCOUNTER — Inpatient Hospital Stay: Attending: Oncology

## 2023-08-17 DIAGNOSIS — C9110 Chronic lymphocytic leukemia of B-cell type not having achieved remission: Secondary | ICD-10-CM | POA: Insufficient documentation

## 2023-08-17 DIAGNOSIS — D509 Iron deficiency anemia, unspecified: Secondary | ICD-10-CM | POA: Insufficient documentation

## 2023-08-17 DIAGNOSIS — Z79899 Other long term (current) drug therapy: Secondary | ICD-10-CM | POA: Diagnosis not present

## 2023-08-17 DIAGNOSIS — M899 Disorder of bone, unspecified: Secondary | ICD-10-CM

## 2023-08-17 LAB — CMP (CANCER CENTER ONLY)
ALT: 19 U/L (ref 0–44)
AST: 34 U/L (ref 15–41)
Albumin: 4.3 g/dL (ref 3.5–5.0)
Alkaline Phosphatase: 83 U/L (ref 38–126)
Anion gap: 7 (ref 5–15)
BUN: 25 mg/dL — ABNORMAL HIGH (ref 8–23)
CO2: 21 mmol/L — ABNORMAL LOW (ref 22–32)
Calcium: 8.8 mg/dL — ABNORMAL LOW (ref 8.9–10.3)
Chloride: 109 mmol/L (ref 98–111)
Creatinine: 1.22 mg/dL (ref 0.61–1.24)
GFR, Estimated: >60 mL/min (ref >60)
Glucose, Bld: 89 mg/dL (ref 70–99)
Potassium: 4.1 mmol/L (ref 3.5–5.1)
Sodium: 137 mmol/L (ref 135–145)
Total Bilirubin: 0.9 mg/dL (ref 0.0–1.2)
Total Protein: 7.4 g/dL (ref 6.5–8.1)

## 2023-08-17 LAB — CBC WITH DIFFERENTIAL (CANCER CENTER ONLY)
Abs Immature Granulocytes: 0.01 K/uL (ref 0.00–0.07)
Basophils Absolute: 0.1 K/uL (ref 0.0–0.1)
Basophils Relative: 1 %
Eosinophils Absolute: 0.2 K/uL (ref 0.0–0.5)
Eosinophils Relative: 2 %
HCT: 33.1 % — ABNORMAL LOW (ref 39.0–52.0)
Hemoglobin: 10.5 g/dL — ABNORMAL LOW (ref 13.0–17.0)
Immature Granulocytes: 0 %
Lymphocytes Relative: 46 %
Lymphs Abs: 3.2 K/uL (ref 0.7–4.0)
MCH: 29 pg (ref 26.0–34.0)
MCHC: 31.7 g/dL (ref 30.0–36.0)
MCV: 91.4 fL (ref 80.0–100.0)
Monocytes Absolute: 0.5 K/uL (ref 0.1–1.0)
Monocytes Relative: 6 %
Neutro Abs: 3.2 K/uL (ref 1.7–7.7)
Neutrophils Relative %: 45 %
Platelet Count: 178 K/uL (ref 150–400)
RBC: 3.62 MIL/uL — ABNORMAL LOW (ref 4.22–5.81)
RDW: 18.9 % — ABNORMAL HIGH (ref 11.5–15.5)
WBC Count: 7 K/uL (ref 4.0–10.5)
nRBC: 0 % (ref 0.0–0.2)

## 2023-08-17 LAB — RETIC PANEL
Immature Retic Fract: 15.7 % (ref 2.3–15.9)
RBC.: 3.64 MIL/uL — ABNORMAL LOW (ref 4.22–5.81)
Retic Count, Absolute: 47 K/uL (ref 19.0–186.0)
Retic Ct Pct: 1.3 % (ref 0.4–3.1)
Reticulocyte Hemoglobin: 32.5 pg (ref >27.9)

## 2023-08-17 LAB — FERRITIN: Ferritin: 24 ng/mL (ref 24–336)

## 2023-08-17 LAB — IRON AND TIBC
Iron: 47 ug/dL (ref 45–182)
Saturation Ratios: 15 % — ABNORMAL LOW (ref 17.9–39.5)
TIBC: 307 ug/dL (ref 250–450)
UIBC: 260 ug/dL

## 2023-08-18 ENCOUNTER — Ambulatory Visit (INDEPENDENT_AMBULATORY_CARE_PROVIDER_SITE_OTHER): Admitting: Neurosurgery

## 2023-08-18 ENCOUNTER — Encounter: Payer: Self-pay | Admitting: Neurosurgery

## 2023-08-18 ENCOUNTER — Telehealth: Payer: Self-pay | Admitting: Nurse Practitioner

## 2023-08-18 VITALS — BP 144/84 | Ht 73.0 in | Wt 220.0 lb

## 2023-08-18 DIAGNOSIS — M4802 Spinal stenosis, cervical region: Secondary | ICD-10-CM | POA: Diagnosis not present

## 2023-08-18 DIAGNOSIS — G959 Disease of spinal cord, unspecified: Secondary | ICD-10-CM | POA: Diagnosis not present

## 2023-08-18 DIAGNOSIS — M25512 Pain in left shoulder: Secondary | ICD-10-CM

## 2023-08-18 DIAGNOSIS — G8929 Other chronic pain: Secondary | ICD-10-CM | POA: Diagnosis not present

## 2023-08-18 LAB — KAPPA/LAMBDA LIGHT CHAINS
Kappa free light chain: 40.2 mg/L — ABNORMAL HIGH (ref 3.3–19.4)
Kappa, lambda light chain ratio: 2.15 — ABNORMAL HIGH (ref 0.26–1.65)
Lambda free light chains: 18.7 mg/L (ref 5.7–26.3)

## 2023-08-18 NOTE — Telephone Encounter (Signed)
 Patient states he has to have appt before they can fill Oxycodone  script dated to fill 08-16-23. He has appt for 09-12-23 w/ [facility] Please call pharmacy

## 2023-08-18 NOTE — Progress Notes (Signed)
 Referring Physician:  Cyrus Selinda Moose, PA-C [address] [place],  [place] [address]  Primary Physician:  Cyrus Selinda Moose, PA-C  History of Present Illness: 08/18/2023 [name] returns to see me.  He has had an eventful last year.  He has been diagnosed with cancer and had treatment.  He is doing better from that.  He comes in today with continued issues with intermittent numbness in his hands as well as some difficulty with balance.  His more pressing issue is left shoulder pain.  He is having difficulty with movements of his left arms and has trouble lifting his arms above his shoulders.  08/05/2022 [name] is here today with a chief complaint of pain in his shoulders and into his arms.  He has difficulty with dexterity.  He has some trouble holding items.  He is also having balance issues.  He has some numbness and tingling in his hands.  Lifting his arms makes his pain worse.  He has been having neck and shoulder pain for 4 years, but the dexterity and balance issues have come more recently. Bowel/Bladder Dysfunction: none  Conservative measures:   Physical therapy:  maybe did at [facility] in last year Multimodal medical therapy including regular antiinflammatories:  percocet, lyrica   Injections:  has received epidural steroid injections  Left C7-T1 IL ESI 06/16/22 (no help)  L5-S1 ESI 06/15/21 L5-S1 ESI 04/15/21 Left shoulder injection 12/24/20 Left shoulder injection 11/10/20   Past Surgery:  Lumbar surgery  Rome Nadara Clydell [name] has symptoms of cervical myelopathy.  The symptoms are causing a significant impact on the patient's life.   Saw Glade Boys PA on 07/27/22:   History of Present Illness: 07/27/2022 [name] has a history of OSA, HTN, DM, hypothyroidism, peripheral neuropathy, CKD stage 3, BPH, chronic lymphocytic leukemia, obesity, iron  deficiency anemia, hyperlipidemia.    Has been seeing pain management ([facility])  and was referred for his neck and lumbar spine.    He has constant  pain in both shoulders down into his arms (entire hand) x years. No significant neck pain. He has intermittent numbness in small/ring fingers. He is dropping things and sometimes has issues with dexterity. He has pain with lifting his arms.    Also with constant LBP with constant posterior bilateral leg pain to his feet. He has numbness and tingling in his feet. He has weakness in his legs. LBP > leg pain, right leg pain = left leg pain. Pain is worse with moving. No alleviating factors.    History of lumbar surgery years ago.    He is taking percocet 10 and lyrica .    His last HgbA1c was 6.0 on 07/21/22.    Bowel/Bladder Dysfunction: none   He does not smoke.    Rome Nadara Clydell [name] has symptoms of cervical myelopathy. He notes worsening balance and he is dropping things. Sometimes has issues with hand dexterity.   I have utilized the care everywhere function in epic to review the outside records available from external health systems.  Review of Systems:  A 10 point review of systems is negative, except for the pertinent positives and negatives detailed in the HPI.  Past Medical History: Past Medical History:  Diagnosis Date   Adenoma of left adrenal gland    Anemia    Aortic atherosclerosis (HCC)    Blood transfusion without reported diagnosis    BPH (benign prostatic hyperplasia)    Bradycardia    CAD (coronary artery  disease) 08/26/2022   a.) cCTA 08/26/2022: Ca2+ 14.3 (12th %'ile for age/sex.race match control); (<25%) pLAD   Cellulitis of left thigh 08/2022   Chronic pain syndrome    a.) on COT managed by pain management   Chronic, continuous use of opioids    a.) chronic pain syndrome/chronic back pain; managed by pain management   CKD (chronic kidney disease) stage 3, GFR 30-59 ml/min (HCC)    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   a.) Rai stage III   DDD (degenerative disc  disease), cervical    Difficult airway 10/22/2015   a.) 1st attempt with glidescope  --> macroglossic (unsuccessful); b.) 2nd attempt with Mac 4 and direct laryngoscopy with a 8.0 ETT --> tube too large to pass through the cords; b.) 3rd attempt (successful) with a Mac 4 and a 7.5 tube   Diverticulosis    DM (diabetes mellitus), type 2 (HCC)    Dysplasia of prostate    Erectile dysfunction    Frequent falls    Heart failure with mildly reduced ejection fraction (HFmrEF) (HCC)    a.) TTE 10/23/2015: EF 60-65%, mod-sev LVH, mild biatrial dil, degen MV disease, AoV sclerosis, asc Ao 38 mm, G1DD; b.) TTE 08/05/2022: EF 45-50%, mod-sev LVH (speckled pattern), sev biatrial dil, mild MR, AoV sclerosis, G1DD   Hepatic flexure mass 08/23/2022   a.) colonoscopy 08/23/2022: 14 x 15 mm partially obstructing ulcerated distal ascending colon mass; tissue friable --> pathology resulted as tubular adenoma, however felt to be false (-) --> referral to sugery and colectomy recommended; b.) s/p RIGHT hemicolectomy 09/28/2022   History of MRSA infection 08/28/2022   a.) MRSA PCR (+) 08/28/2022; culture from LEFT thigh abscess   HTN (hypertension)    Hyperlipidemia    Hypogonadism in male    Hypothyroidism    IDA (iron  deficiency anemia)    LBBB (left bundle branch block)    Long-term use of aspirin  therapy    Lumbar spinal stenosis    Mild cardiomegaly    Multiple lung nodules on CT    NICM (nonischemic cardiomyopathy) (HCC)    a.) TTE 10/23/2015: EF 60-65; b.) TTE 08/05/2022: EF 45-50%   Orchitis and epididymitis 06/16/2013   OSA on CPAP    Palindromic rheumatism, hand    Pericardial effusion    Prostatitis    Sepsis (HCC) 08/2022   Subdural hematoma (HCC) 10/22/2015   a.) s/p traumatic mechanical fall --> CT head 10/22/2015: high-density SDH the left cerebral convexity with13 mm midline shift --> s/p LEFT frontal burr hole craniotomy   Tubular adenoma of colon    Ventral hernia     Past Surgical  History: Past Surgical History:  Procedure Laterality Date   BACK SURGERY     BIOPSY  08/21/2022   Procedure: BIOPSY;  Surgeon: Onita Elspeth Sharper, DO;  Location: [facility] ENDOSCOPY;  Service: Gastroenterology;;   BIOPSY  08/23/2022   Procedure: BIOPSY;  Surgeon: Onita Elspeth Sharper, DO;  Location: [facility] ENDOSCOPY;  Service: Gastroenterology;;   BIOPSY  02/28/2023   Procedure: BIOPSY;  Surgeon: Onita Elspeth Sharper, DO;  Location: [facility] ENDOSCOPY;  Service: Gastroenterology;;   SOLMON DAKIN OF CRANIUM Left 10/22/2015   COLON SURGERY  09/28/2022   COLONOSCOPY WITH PROPOFOL  N/A 08/23/2022   Procedure: COLONOSCOPY WITH PROPOFOL ;  Surgeon: Onita Elspeth Sharper, DO;  Location: [facility] ENDOSCOPY;  Service: Gastroenterology;  Laterality: N/A;   COLONOSCOPY WITH PROPOFOL  N/A 02/28/2023   Procedure: COLONOSCOPY WITH PROPOFOL ;  Surgeon: Onita Elspeth Sharper, DO;  Location:  [facility] ENDOSCOPY;  Service: Gastroenterology;  Laterality: N/A;  DM   COLONOSCOPY WITH PROPOFOL  N/A 04/25/2023   Procedure: COLONOSCOPY WITH PROPOFOL ;  Surgeon: Onita Elspeth Sharper, DO;  Location: [facility] ENDOSCOPY;  Service: Gastroenterology;  Laterality: N/A;  DM   ESOPHAGOGASTRODUODENOSCOPY (EGD) WITH PROPOFOL  N/A 08/21/2022   Procedure: ESOPHAGOGASTRODUODENOSCOPY (EGD) WITH PROPOFOL ;  Surgeon: Onita Elspeth Sharper, DO;  Location: [facility] ENDOSCOPY;  Service: Gastroenterology;  Laterality: N/A;   ESOPHAGOGASTRODUODENOSCOPY (EGD) WITH PROPOFOL  N/A 02/28/2023   Procedure: ESOPHAGOGASTRODUODENOSCOPY (EGD) WITH PROPOFOL ;  Surgeon: Onita Elspeth Sharper, DO;  Location: [facility] ENDOSCOPY;  Service: Gastroenterology;  Laterality: N/A;   GIVENS CAPSULE STUDY N/A 08/23/2022   Procedure: GIVENS CAPSULE STUDY;  Surgeon: Onita Elspeth Sharper, DO;  Location: [facility] ENDOSCOPY;  Service: Gastroenterology;  Laterality: N/A;   HERNIA REPAIR     INGUINAL HERNIA REPAIR Right 03/09/2023   Procedure: HERNIA REPAIR INGUINAL ADULT, open, RNFA to assist;  Surgeon:  Jordis Laneta FALCON, MD;  Location: [facility] ORS;  Service: General;  Laterality: Right;   IR US  GUIDE BX ASP/DRAIN  06/27/2023   IRRIGATION AND DEBRIDEMENT ABSCESS Left 08/28/2022   Procedure: IRRIGATION AND DEBRIDEMENT ABSCESS LEFT UPPER THIGH/GROIN;  Surgeon: Jordis Laneta FALCON, MD;  Location: [facility] ORS;  Service: General;  Laterality: Left;   LAPAROSCOPIC RIGHT COLECTOMY Right 09/28/2022   Procedure: LAPAROSCOPIC RIGHT COLECTOMY, RNFA to assist;  Surgeon: Jordis Laneta FALCON, MD;  Location: [facility] ORS;  Service: General;  Laterality: Right;   POLYPECTOMY  08/23/2022   Procedure: POLYPECTOMY;  Surgeon: Onita Elspeth Sharper, DO;  Location: [facility] ENDOSCOPY;  Service: Gastroenterology;;   POLYPECTOMY  04/25/2023   Procedure: POLYPECTOMY;  Surgeon: Onita Elspeth Sharper, DO;  Location: [facility] ENDOSCOPY;  Service: Gastroenterology;;   SUBMUCOSAL TATTOO INJECTION  08/23/2022   Procedure: SUBMUCOSAL TATTOO INJECTION;  Surgeon: Onita Elspeth Sharper, DO;  Location: [facility] ENDOSCOPY;  Service: Gastroenterology;;   TONSILLECTOMY     VENTRAL HERNIA REPAIR N/A 09/28/2022   Procedure: HERNIA REPAIR VENTRAL ADULT;  Surgeon: Jordis Laneta FALCON, MD;  Location: [facility] ORS;  Service: General;  Laterality: N/A;    Allergies: Allergies as of 08/18/2023   (No Known Allergies)    Medications:  Current Outpatient Medications:    acetaminophen  (TYLENOL ) 325 MG tablet, Take 2 tablets (650 mg total) by mouth every 6 (six) hours as needed for mild pain (pain score 1-3) or fever., Disp: , Rfl:    albuterol  (VENTOLIN  HFA) 108 (90 Base) MCG/ACT inhaler, Inhale 2 puffs into the lungs every 4 (four) hours as needed for wheezing or shortness of breath., Disp: , Rfl:    aspirin  EC 81 MG tablet, Take 1 tablet (81 mg total) by mouth daily. Swallow whole., Disp: , Rfl:    empagliflozin  (JARDIANCE ) 10 MG TABS tablet, Take 1 tablet (10 mg total) by mouth daily before breakfast., Disp: 90 tablet, Rfl: 2   fluticasone -salmeterol (ADVAIR) 250-50 MCG/ACT AEPB,  Inhale 1 puff into the lungs in the morning and at bedtime., Disp: , Rfl:    furosemide  (LASIX ) 20 MG tablet, Take 20 mg by mouth daily as needed., Disp: , Rfl:    hydrALAZINE  (APRESOLINE ) 50 MG tablet, Take 2 tablets (100 mg total) by mouth 2 (two) times daily., Disp: 180 tablet, Rfl: 3   ipratropium (ATROVENT) 0.03 % nasal spray, Place 2 sprays into the nose 2 (two) times daily., Disp: , Rfl:    levothyroxine  (SYNTHROID ) 200 MCG tablet, Take 1 tablet (200 mcg total) by mouth daily before breakfast., Disp: 90 tablet, Rfl: 0  metFORMIN  (GLUCOPHAGE ) 500 MG tablet, Take 2 tablets (1,000 mg total) by mouth 2 (two) times daily with a meal., Disp: 120 tablet, Rfl: 0   metoprolol  succinate (TOPROL  XL) 25 MG 24 hr tablet, Take 1 tablet (25 mg total) by mouth daily., Disp: 90 tablet, Rfl: 1   oxyCODONE -acetaminophen  (PERCOCET) 10-325 MG tablet, Take 1 tablet by mouth every 8 (eight) hours as needed for pain. Must last 30 days., Disp: 90 tablet, Rfl: 0   oxyCODONE -acetaminophen  (PERCOCET) 10-325 MG tablet, Take 1 tablet by mouth every 8 (eight) hours as needed for pain. Must last 30 days., Disp: 90 tablet, Rfl: 0   rosuvastatin  (CRESTOR ) 40 MG tablet, Take 1 tablet (40 mg total) by mouth daily., Disp: 90 tablet, Rfl: 1   sacubitril -valsartan  (ENTRESTO ) 97-103 MG, Take 1 tablet by mouth 2 (two) times daily., Disp: 120 tablet, Rfl: 3   spironolactone  (ALDACTONE ) 25 MG tablet, Take 0.5 tablets (12.5 mg total) by mouth daily., Disp: 45 tablet, Rfl: 3   [Paused] zanubrutinib  (BRUKINSA ) 80 MG capsule, Take 2 capsules (160 mg total) by mouth 2 (two) times daily., Disp: 120 capsule, Rfl: 2  Social History: Social History   Tobacco Use   Smoking status: Former    Types: Cigarettes    Passive exposure: Past   Smokeless tobacco: Never   Tobacco comments:    Stop smoking iver 40  plus years ago  Vaping Use   Vaping status: Never Used  Substance Use Topics   Alcohol use: No   Drug use: No    Family Medical  History: Family History  Problem Relation Age of Onset   Cancer Maternal Aunt    ADD / ADHD Daughter    Asthma Son    Prostate cancer Neg Hx    Kidney disease Neg Hx    Kidney cancer Neg Hx    Bladder Cancer Neg Hx     Physical Examination: Vitals:   08/18/23 1020  BP: (!) 144/84     General: Patient is in no apparent distress. Attention to examination is appropriate.  Neck:   Supple.  Full range of motion.  Respiratory: Patient is breathing without any difficulty.   NEUROLOGICAL:     Awake, alert, oriented to person, place, and time.  Speech is clear and fluent.   Cranial Nerves: Pupils equal round and reactive to light.  Facial tone is symmetric.  Facial sensation is symmetric. Shoulder shrug is symmetric. Tongue protrusion is midline.  There is no pronator drift.  Strength: Side Biceps Triceps Deltoid Interossei Grip Wrist Ext. Wrist Flex.  R 5 5 4 5  4+ 5 5  L 5 5 4 5  4+ 5 5   Side Iliopsoas Quads Hamstring PF DF EHL  R 5 5 5 5 5 5   L 5 5 5 5 5 5    Reflexes are 2+ and symmetric at the biceps, triceps, brachioradialis, patella and achilles.   Hoffman's is present on right.   Bilateral upper and lower extremity sensation is intact to light touch.    No evidence of dysmetria noted.  Gait is abnormal and wide-based.     Medical Decision Making  Imaging: MRI C spine 04/23/2022 Disc levels:   Discs: Degenerative disease with disc height loss at C2-3, C3-4, C4-5, C5-6, C6-7 and C7-T1.   C2-3: Broad-based disc bulge. Mild left foraminal stenosis. No right foraminal stenosis. No spinal stenosis. Mild bilateral facet arthropathy.   C3-4: Broad-based disc bulge with a broad central disc protrusion. Bilateral uncovertebral degenerative  changes. Moderate bilateral facet arthropathy. Moderate-severe bilateral foraminal stenosis. Moderate spinal stenosis.   C4-5: Broad-based disc bulge flattening of the ventral cervical spinal cord. Moderate bilateral facet  arthropathy. Bilateral uncovertebral degenerative changes. Moderate-severe bilateral foraminal stenosis.   C5-6: Broad-based disc bulge with a broad central disc protrusion. Bilateral uncovertebral degenerative changes. Moderate right and severe left foraminal stenosis. Mild spinal stenosis.   C6-7: Broad-based disc bulge. Mild left and moderate right foraminal stenosis. No spinal stenosis.   C7-T1: Mild broad-based disc bulge. Mild-moderate bilateral foraminal stenosis. No spinal stenosis.   IMPRESSION: 1. Diffuse cervical spine spondylosis as described above. 2. No acute osseous injury of the cervical spine.     Electronically Signed   By: Julaine Blanch M.D.   On: 04/23/2022 10:45  MRI of lumbar spine dated 06/02/22:  FINDINGS: Segmentation: Transitional anatomy with partial lumbarization of the S1 vertebral body.   Alignment: Grade 1 anterolisthesis of L3 on L4 and L4 on L5 secondary to facet disease.   Vertebrae: No acute fracture, evidence of discitis, or aggressive bone lesion.   Conus medullaris and cauda equina: Conus extends to the L1-2 level. Conus and cauda equina appear normal.   Paraspinal and other soft tissues: No acute paraspinal abnormality.   Disc levels:   Disc spaces: Degenerative disease with disc height loss at L3-4 and L4-5. Disc desiccation at L5-S1.   T12-L1: No significant disc bulge. No neural foraminal stenosis. No central canal stenosis.   L1-L2: No significant disc bulge. Mild bilateral facet arthropathy. No foraminal or central canal stenosis.   L2-L3: Minimal broad-based disc bulge with a tiny left paracentral disc protrusion. Moderate bilateral facet arthropathy. Mild spinal stenosis. Bilateral lateral recess and stenosis. No foraminal stenosis.   L3-L4: Broad-based disc bulge. Severe bilateral facet arthropathy. Severe spinal stenosis. Moderate bilateral foraminal stenosis.   L4-L5: Broad-based disc bulge. Prior laminectomy.  Severe bilateral facet arthropathy and reactive marrow edema. Moderate spinal stenosis. Moderate bilateral foraminal stenosis.   L5-S1: Broad-based disc bulge with a small right subarticular disc protrusion. Mild bilateral facet arthropathy. No foraminal stenosis. Bilateral subarticular recess stenosis. Mild spinal stenosis.   IMPRESSION: 1. Lumbar spine spondylosis as described above, most severe at L3-4 and L4-5. 2. No acute osseous injury of the lumbar spine.     Electronically Signed   By: Julaine Blanch M.D.   On: 06/07/2022 08:46  MRI C spine 07/24/2023 IMPRESSION: 1. Enhancing and indeterminate right C4 posterior element/facet bone lesion, which is superimposed on diffusely abnormal but nonspecific generalized decreased T1 marrow signal in the visible spine and at the skull base.   Marrow infiltrative processes such as myelofibrosis or lymphoma cannot be excluded. But the absence of other suspicious marrow edema or enhancement argues against metastatic disease or multiple myeloma. And furthermore similar generalized abnormal marrow signal can be caused by chronic anemia, smoking, obesity.   2. Underlying severe chronic cervical spine degeneration with reversal of lordosis and multifactorial degenerative cervical spinal stenosis at all levels. Associated moderate spinal stenosis and moderate (up to severe) spinal cord mass effect C3-C4 through C5-C6. No associated spinal cord edema or myelomalacia. Associated moderate or severe degenerative neural foraminal stenosis at the bilateral C4 through C8 nerve levels.     Electronically Signed   By: VEAR Hurst M.D.   On: 07/31/2023 06:16   I have personally reviewed the images and agree with the above interpretation.  Assessment and Plan: Paul Briggs is a pleasant 78 y.o. 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   He continues to have symptoms of cervical myelopathy.  However, his left  shoulder pain is most pressing currently.  I would like to get an MRI scan of his left shoulder to evaluate for rotator cuff pathology.  If that is negative, we will proceed with C3-6 anterior cervical discectomy and fusion.  He has a lesion at C4 that was not previously present.  Will need to monitor that  I spent a total of 30 minutes in this patient's care today. This time was spent reviewing pertinent records including imaging studies, obtaining and confirming history, performing a directed evaluation, formulating and discussing my recommendations, and documenting the visit within the medical record.    Thank you for involving me in the care of this patient.      Tianna Baus K. Clois MD, [facility]

## 2023-08-18 NOTE — H&P (Signed)
 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  Referring Provider(s): Dr. Zelphia Cap, MD   Supervising Physician: Johann Sieving  Patient Status: [facility] - Out-pt  Patient is Full Code  History of Present Illness: Paul Ladnier [name] is a 78 y.o. male  with PMHx notable for chronic lymphocytic leukemia, HTN, HLD, CAD, HFmrEF, NICM, LBBB, pericardial effusion, IDA, COPD, OSA on CPAP, difficult airway, DM, CKD stage III, subdural hematoma, chronic pain managed by outpatient pain clinic, frequent falls, and others as delineated below.  Paul Briggs is followed by Oncology service for chronic lymphocytic leukemia, (diagnosed on 01/01/22), and was recently taken off Brukinsa . An incidental bony lesion was found on the cervical spine, further evaluated with an MRI, revealing a lesion at the C4 level on the right side. Dr. Cap has requested for bone marrow biopsy and aspiration. Patient is scheduled for same in IR today.  CT Cervical Spine w/o CM on 6/13: IMPRESSION: CT head:   1.  No evidence of an acute intracranial abnormality. 2. Left frontoparietal cranioplasty. Correlate with the surgical history. 3. Mild generalized parenchymal atrophy. 4. Paranasal sinus disease at the imaged levels as described.   CT cervical spine:   1. No evidence of an acute cervical spine fracture. 2. Nonspecific reversal of the expected cervical lordosis. 3. Dextrocurvature of the cervical spine. 4. Cervical spondylosis and ossification of the posterior longitudinal ligament, as described. 5. Indeterminate 10 mm lucent osseous lesion within the right C4 pars, increased in size since the cervical spine CT of 08/26/2022. A non-emergent cervical spine MRI (with and without contrast) is recommended for further evaluation.   MR Cervical Spine w w/o CM on  6/29: IMPRESSION: 1. Enhancing and indeterminate right C4 posterior element/facet bone lesion, which is superimposed on diffusely abnormal but nonspecific generalized decreased T1 marrow signal in the visible spine and at the skull base.   Marrow infiltrative processes such as myelofibrosis or lymphoma cannot be excluded. But the absence of other suspicious marrow edema or enhancement argues against metastatic disease or multiple myeloma. And furthermore similar generalized abnormal marrow signal can be caused by chronic anemia, smoking, obesity.   2. Underlying severe chronic cervical spine degeneration with reversal of lordosis and multifactorial degenerative cervical spinal stenosis at all levels. Associated moderate spinal stenosis and moderate (up to severe) spinal cord mass effect C3-C4 through C5-C6. No associated spinal cord edema or myelomalacia. Associated moderate or severe degenerative neural foraminal stenosis at the bilateral C4 through C8 nerve levels.  Patient is alert and laying in bed, calm.  Patient is currently without any significant complaints. He is experiencing baseline chronic lower back pain and bilateral shoulder pain. Patient denies any fevers, headache, chest pain, SOB, cough, abdominal pain, nausea, vomiting or bleeding.     Past Medical History:  Diagnosis Date   Adenoma of left adrenal gland    Anemia    Aortic atherosclerosis (HCC)    Blood transfusion without reported diagnosis    BPH (benign prostatic hyperplasia)    Bradycardia    CAD (coronary artery disease) 08/26/2022   a.) cCTA 08/26/2022: Ca2+ 14.3 (12th %'ile for age/sex.race match control); (<25%) pLAD   Cellulitis of left thigh 08/2022   Chronic pain syndrome    a.) on COT managed by pain management   Chronic, continuous use of opioids    a.) chronic pain syndrome/chronic back pain; managed  by pain management   CKD (chronic kidney disease) stage 3, GFR 30-59 ml/min (HCC)    CLL  (chronic lymphocytic leukemia) (HCC) 12/28/2021   a.) Rai stage III   DDD (degenerative disc disease), cervical    Difficult airway 10/22/2015   a.) 1st attempt with glidescope  --> macroglossic (unsuccessful); b.) 2nd attempt with Mac 4 and direct laryngoscopy with a 8.0 ETT --> tube too large to pass through the cords; b.) 3rd attempt (successful) with a Mac 4 and a 7.5 tube   Diverticulosis    DM (diabetes mellitus), type 2 (HCC)    Dysplasia of prostate    Erectile dysfunction    Frequent falls    Heart failure with mildly reduced ejection fraction (HFmrEF) (HCC)    a.) TTE 10/23/2015: EF 60-65%, mod-sev LVH, mild biatrial dil, degen MV disease, AoV sclerosis, asc Ao 38 mm, G1DD; b.) TTE 08/05/2022: EF 45-50%, mod-sev LVH (speckled pattern), sev biatrial dil, mild MR, AoV sclerosis, G1DD   Hepatic flexure mass 08/23/2022   a.) colonoscopy 08/23/2022: 14 x 15 mm partially obstructing ulcerated distal ascending colon mass; tissue friable --> pathology resulted as tubular adenoma, however felt to be false (-) --> referral to sugery and colectomy recommended; b.) s/p RIGHT hemicolectomy 09/28/2022   History of MRSA infection 08/28/2022   a.) MRSA PCR (+) 08/28/2022; culture from LEFT thigh abscess   HTN (hypertension)    Hyperlipidemia    Hypogonadism in male    Hypothyroidism    IDA (iron  deficiency anemia)    LBBB (left bundle branch block)    Long-term use of aspirin  therapy    Lumbar spinal stenosis    Mild cardiomegaly    Multiple lung nodules on CT    NICM (nonischemic cardiomyopathy) (HCC)    a.) TTE 10/23/2015: EF 60-65; b.) TTE 08/05/2022: EF 45-50%   Orchitis and epididymitis 06/16/2013   OSA on CPAP    Palindromic rheumatism, hand    Pericardial effusion    Prostatitis    Sepsis (HCC) 08/2022   Subdural hematoma (HCC) 10/22/2015   a.) s/p traumatic mechanical fall --> CT head 10/22/2015: high-density SDH the left cerebral convexity with13 mm midline shift --> s/p LEFT  frontal burr hole craniotomy   Tubular adenoma of colon    Ventral hernia     Past Surgical History:  Procedure Laterality Date   BACK SURGERY     BIOPSY  08/21/2022   Procedure: BIOPSY;  Surgeon: Onita Elspeth Sharper, DO;  Location: [facility] ENDOSCOPY;  Service: Gastroenterology;;   BIOPSY  08/23/2022   Procedure: BIOPSY;  Surgeon: Onita Elspeth Sharper, DO;  Location: [facility] ENDOSCOPY;  Service: Gastroenterology;;   BIOPSY  02/28/2023   Procedure: BIOPSY;  Surgeon: Onita Elspeth Sharper, DO;  Location: [facility] ENDOSCOPY;  Service: Gastroenterology;;   SOLMON DAKIN OF CRANIUM Left 10/22/2015   COLON SURGERY  09/28/2022   COLONOSCOPY WITH PROPOFOL  N/A 08/23/2022   Procedure: COLONOSCOPY WITH PROPOFOL ;  Surgeon: Onita Elspeth Sharper, DO;  Location: [facility] ENDOSCOPY;  Service: Gastroenterology;  Laterality: N/A;   COLONOSCOPY WITH PROPOFOL  N/A 02/28/2023   Procedure: COLONOSCOPY WITH PROPOFOL ;  Surgeon: Onita Elspeth Sharper, DO;  Location: [facility] ENDOSCOPY;  Service: Gastroenterology;  Laterality: N/A;  DM   COLONOSCOPY WITH PROPOFOL  N/A 04/25/2023   Procedure: COLONOSCOPY WITH PROPOFOL ;  Surgeon: Onita Elspeth Sharper, DO;  Location: [facility] ENDOSCOPY;  Service: Gastroenterology;  Laterality: N/A;  DM   ESOPHAGOGASTRODUODENOSCOPY (EGD) WITH PROPOFOL  N/A 08/21/2022   Procedure: ESOPHAGOGASTRODUODENOSCOPY (EGD) WITH PROPOFOL ;  Surgeon: Onita,  Elspeth Sharper, DO;  Location: [facility] ENDOSCOPY;  Service: Gastroenterology;  Laterality: N/A;   ESOPHAGOGASTRODUODENOSCOPY (EGD) WITH PROPOFOL  N/A 02/28/2023   Procedure: ESOPHAGOGASTRODUODENOSCOPY (EGD) WITH PROPOFOL ;  Surgeon: Onita Elspeth Sharper, DO;  Location: [facility] ENDOSCOPY;  Service: Gastroenterology;  Laterality: N/A;   GIVENS CAPSULE STUDY N/A 08/23/2022   Procedure: GIVENS CAPSULE STUDY;  Surgeon: Onita Elspeth Sharper, DO;  Location: [facility] ENDOSCOPY;  Service: Gastroenterology;  Laterality: N/A;   HERNIA REPAIR     INGUINAL HERNIA REPAIR Right 03/09/2023    Procedure: HERNIA REPAIR INGUINAL ADULT, open, RNFA to assist;  Surgeon: Jordis Laneta FALCON, MD;  Location: [facility] ORS;  Service: General;  Laterality: Right;   IR US  GUIDE BX ASP/DRAIN  06/27/2023   IRRIGATION AND DEBRIDEMENT ABSCESS Left 08/28/2022   Procedure: IRRIGATION AND DEBRIDEMENT ABSCESS LEFT UPPER THIGH/GROIN;  Surgeon: Jordis Laneta FALCON, MD;  Location: [facility] ORS;  Service: General;  Laterality: Left;   LAPAROSCOPIC RIGHT COLECTOMY Right 09/28/2022   Procedure: LAPAROSCOPIC RIGHT COLECTOMY, RNFA to assist;  Surgeon: Jordis Laneta FALCON, MD;  Location: [facility] ORS;  Service: General;  Laterality: Right;   POLYPECTOMY  08/23/2022   Procedure: POLYPECTOMY;  Surgeon: Onita Elspeth Sharper, DO;  Location: [facility] ENDOSCOPY;  Service: Gastroenterology;;   POLYPECTOMY  04/25/2023   Procedure: POLYPECTOMY;  Surgeon: Onita Elspeth Sharper, DO;  Location: [facility] ENDOSCOPY;  Service: Gastroenterology;;   SUBMUCOSAL TATTOO INJECTION  08/23/2022   Procedure: SUBMUCOSAL TATTOO INJECTION;  Surgeon: Onita Elspeth Sharper, DO;  Location: [facility] ENDOSCOPY;  Service: Gastroenterology;;   TONSILLECTOMY     VENTRAL HERNIA REPAIR N/A 09/28/2022   Procedure: HERNIA REPAIR VENTRAL ADULT;  Surgeon: Jordis Laneta FALCON, MD;  Location: [facility] ORS;  Service: General;  Laterality: N/A;    Allergies: Patient has no known allergies.  Medications: Prior to Admission medications   Medication Sig Start Date End Date Taking? Authorizing Provider  acetaminophen  (TYLENOL ) 325 MG tablet Take 2 tablets (650 mg total) by mouth every 6 (six) hours as needed for mild pain (pain score 1-3) or fever. 06/28/23   Fausto Burnard LABOR, DO  albuterol  (VENTOLIN  HFA) 108 (90 Base) MCG/ACT inhaler Inhale 2 puffs into the lungs every 4 (four) hours as needed for wheezing or shortness of breath. 04/28/23 04/27/24  [provider]  aspirin  EC 81 MG tablet Take 1 tablet (81 mg total) by mouth daily. Swallow whole. 08/09/22   Furth, Cadence H, PA-C  benzonatate  (TESSALON) 100 MG capsule Take 100-200 mg by mouth 3 (three) times daily as needed.    [provider]  empagliflozin  (JARDIANCE ) 10 MG TABS tablet Take 1 tablet (10 mg total) by mouth daily before breakfast. 05/24/23   Bensimhon, Toribio SAUNDERS, MD  fluticasone -salmeterol (ADVAIR) 250-50 MCG/ACT AEPB Inhale 1 puff into the lungs in the morning and at bedtime. 04/12/23   [provider]  furosemide  (LASIX ) 20 MG tablet Take 20 mg by mouth daily as needed. 06/19/23   [provider]  hydrALAZINE  (APRESOLINE ) 50 MG tablet Take 2 tablets (100 mg total) by mouth 2 (two) times daily. 05/05/23   Bensimhon, Daniel R, MD  ipratropium (ATROVENT) 0.03 % nasal spray Place 2 sprays into the nose 2 (two) times daily. 04/28/23 04/27/24  [provider]  levothyroxine  (SYNTHROID ) 200 MCG tablet Take 1 tablet (200 mcg total) by mouth daily before breakfast. 12/27/22   Emilio Kelly DASEN, FNP  metFORMIN  (GLUCOPHAGE ) 500 MG tablet Take 2 tablets (1,000 mg total) by mouth 2 (two) times daily with a meal. 06/28/23  Fausto Sor A, DO  metoprolol  succinate (TOPROL  XL) 25 MG 24 hr tablet Take 1 tablet (25 mg total) by mouth daily. 05/05/23   Bensimhon, Toribio SAUNDERS, MD  oxyCODONE -acetaminophen  (PERCOCET) 10-325 MG tablet Take 1 tablet by mouth every 8 (eight) hours as needed for pain. Must last 30 days. 08/16/23 09/15/23  Patel, Seema K, NP  rosuvastatin  (CRESTOR ) 40 MG tablet Take 1 tablet (40 mg total) by mouth daily. 05/05/23 08/03/23  Bensimhon, Toribio SAUNDERS, MD  sacubitril -valsartan  (ENTRESTO ) 97-103 MG Take 1 tablet by mouth 2 (two) times daily. 05/24/23   Bensimhon, Toribio SAUNDERS, MD  spironolactone  (ALDACTONE ) 25 MG tablet Take 0.5 tablets (12.5 mg total) by mouth daily. 05/05/23 08/03/23  Bensimhon, Toribio SAUNDERS, MD  zanubrutinib  (BRUKINSA ) 80 MG capsule Take 2 capsules (160 mg total) by mouth 2 (two) times daily. Patient not taking: Reported on 07/06/2023 03/17/23   Babara Call, MD     Family History  Problem Relation Age  of Onset   Cancer Maternal Aunt    ADD / ADHD Daughter    Asthma Son    Prostate cancer Neg Hx    Kidney disease Neg Hx    Kidney cancer Neg Hx    Bladder Cancer Neg Hx     Social History   Socioeconomic History   Marital status: Married    Spouse name: Not on file   Number of children: Not on file   Years of education: Not on file   Highest education level: 5th grade  Occupational History   Not on file  Tobacco Use   Smoking status: Former    Types: Cigarettes    Passive exposure: Past   Smokeless tobacco: Never   Tobacco comments:    Stop smoking iver 40  plus years ago  Vaping Use   Vaping status: Never Used  Substance and Sexual Activity   Alcohol use: No   Drug use: No   Sexual activity: Not Currently  Other Topics Concern   Not on file  Social History Narrative   Not on file   Social Drivers of Health   Financial Resource Strain: Patient Declined (04/26/2023)   Received from [facility]   Overall Financial Resource Strain (CARDIA)    Difficulty of Paying Living Expenses: Patient declined  Recent Concern: Financial Resource Strain - Medium Risk (02/24/2023)   Received from [facility]   Overall Financial Resource Strain (CARDIA)    Difficulty of Paying Living Expenses: Somewhat hard  Food Insecurity: No Food Insecurity (06/23/2023)   Hunger Vital Sign    Worried About Running Out of Food in the Last Year: Never true    Ran Out of Food in the Last Year: Never true  Transportation Needs: Patient Declined (08/07/2023)   Received from [facility] - Transportation    In the past 12 months, has lack of transportation kept you from medical appointments or from getting medications?: Patient declined    Lack of Transportation (Non-Medical): Patient declined  Physical Activity: Unknown (07/26/2022)   Exercise Vital Sign    Days of Exercise per Week: Patient declined    Minutes of Exercise per Session: Not  on file  Stress: Stress Concern Present (07/26/2022)   [employer] of Occupational Health - Occupational Stress Questionnaire    Feeling of Stress : To some extent  Social Connections: Socially Integrated (06/23/2023)   Social Connection and Isolation Panel    Frequency of Communication with Friends and  Family: More than three times a week    Frequency of Social Gatherings with Friends and Family: Patient declined    Attends Religious Services: More than 4 times per year    Active Member of [employer] or Organizations: Yes    Attends Banker Meetings: Never    Marital Status: Married     Review of Systems: A 12 point ROS discussed and pertinent positives are indicated in the HPI above.  All other systems are negative.  Vital Signs: There were no vitals taken for this visit.  Advance Care Plan: The advanced care place/surrogate decision maker was discussed at the time of visit and the patient did not wish to discuss or was not able to name a surrogate decision maker or provide an advance care plan.  Physical Exam Vitals reviewed.  Constitutional:      Appearance: Normal appearance.  HENT:     Mouth/Throat:     Mouth: Mucous membranes are dry.  Cardiovascular:     Rate and Rhythm: Normal rate and regular rhythm.     Pulses: Normal pulses.  Pulmonary:     Effort: Pulmonary effort is normal.     Breath sounds: Normal breath sounds.  Abdominal:     General: Abdomen is flat.     Palpations: Abdomen is soft.     Tenderness: There is no abdominal tenderness.  Musculoskeletal:        General: Normal range of motion.     Cervical back: Normal range of motion.  Skin:    General: Skin is warm and dry.  Neurological:     Mental Status: He is alert and oriented to person, place, and time.  Psychiatric:        Mood and Affect: Mood normal.        Behavior: Behavior normal.        Thought Content: Thought content normal.        Judgment: Judgment normal.      Imaging: MR CERVICAL SPINE W WO CONTRAST Result Date: 07/31/2023 CLINICAL DATA:  78 year old male with right C4 indeterminate lucent bone lesion on CT last month for trauma. EXAM: MRI CERVICAL SPINE WITHOUT AND WITH CONTRAST TECHNIQUE: Multiplanar and multiecho pulse sequences of the cervical spine, to include the craniocervical junction and cervicothoracic junction, were obtained without and with intravenous contrast. CONTRAST:  10mL GADAVIST  GADOBUTROL  1 MMOL/ML IV SOLN COMPARISON:  Cervical spine CT 07/08/2023. FINDINGS: Alignment: Continued reversal of the normal cervical lordosis, although slightly less pronounced compared to the CT. No significant scoliosis or spondylolisthesis. Vertebrae: Interbody ankylosis which appears degenerative at C6-C7, associated increased signal in that disc space. Diffuse background decreased T1 marrow signal in the visible spine, heterogeneously involving the skull base, which is nonspecific. And furthermore there is abnormal marrow signal associated with the right C4 lamina, site of lucent lesion by CT last month. Heterogeneous enhancement, rim enhancement there plus confluent increased T2 and STIR signal (series 7, image 2 and series 9, image 12). But no adjacent facet or other osseous structure marrow edema is identified. And no suspicious bone enhancement identified elsewhere. Cord: Degenerative spinal cord mass effect, detailed below. But no evidence of associated spinal cord signal changes, no edema or myelomalacia identified. Axial postcontrast images are not provided. But there is no convincing abnormal intradural enhancement, no dural thickening. Posterior Fossa, vertebral arteries, paraspinal tissues: Cervicomedullary junction is within normal limits. Negative visible posterior fossa. No visible abnormal intracranial enhancement. Preserved major vascular flow voids in the bilateral  neck. Small volume layering secretions in the pharynx. No paraspinal soft tissue  inflammation, fluid collection, or mass identified. Grossly negative visible lung apices. Disc levels: Diffusely advanced cervical spine disc and endplate degeneration as noted by CT. Multilevel vacuum disc last month. Subsequent stenosis: C2-C3: Mild spinal stenosis. No cord mass effect. Mild left C3 foraminal stenosis. C3-C4: Moderate to severe spinal stenosis and broad-based cord mass effect from central disc osteophyte complex on series 8, image 10. Facet hypertrophy. Moderate to severe bilateral C4 foraminal stenosis. C4-C5: Moderate spinal stenosis and cord mass effect from asymmetric broad-based posterior disc osteophyte complex to the left (series 8, image 14). Facet hypertrophy. Severe left and moderate to severe right C5 foraminal stenosis. C5-C6: Moderate spinal stenosis and mild spinal cord mass effect from lobulated asymmetric leftward and central disc osteophyte complex (series 8, images 19 and 20). Moderate to severe bilateral C6 foraminal stenosis. C6-C7: Ankylosis of this level. Mild spinal stenosis and spinal cord mass effect. Moderate bilateral C7 foraminal stenosis. C7-T1: Mild to moderate spinal stenosis and mild cord mass effect related to asymmetric circumferential disc osteophyte complex to the right and ligament flavum hypertrophy (series 8, image 28). Moderate left and moderate to severe right C8 foraminal stenosis. Visible upper thoracic levels, no spinal stenosis. IMPRESSION: 1. Enhancing and indeterminate right C4 posterior element/facet bone lesion, which is superimposed on diffusely abnormal but nonspecific generalized decreased T1 marrow signal in the visible spine and at the skull base. Marrow infiltrative processes such as myelofibrosis or lymphoma cannot be excluded. But the absence of other suspicious marrow edema or enhancement argues against metastatic disease or multiple myeloma. And furthermore similar generalized abnormal marrow signal can be caused by chronic anemia,  smoking, obesity. 2. Underlying severe chronic cervical spine degeneration with reversal of lordosis and multifactorial degenerative cervical spinal stenosis at all levels. Associated moderate spinal stenosis and moderate (up to severe) spinal cord mass effect C3-C4 through C5-C6. No associated spinal cord edema or myelomalacia. Associated moderate or severe degenerative neural foraminal stenosis at the bilateral C4 through C8 nerve levels. Electronically Signed   By: VEAR Hurst M.D.   On: 07/31/2023 06:16    Labs:  CBC: Recent Labs    06/24/23 0446 06/26/23 0507 06/29/23 1136 08/17/23 1140  WBC 9.6 7.0 5.3 7.0  HGB 8.7* 9.5* 9.4* 10.5*  HCT 27.4* 30.0* 30.1* 33.1*  PLT 140* 171 228 178    COAGS: Recent Labs    08/26/22 2218 06/23/23 2000  INR 1.4* 1.3*  APTT 42* 38*    BMP: Recent Labs    06/26/23 0507 06/27/23 0252 06/29/23 1136 08/17/23 1140  NA 138 138 137 137  K 3.9 3.5 3.5 4.1  CL 109 110 108 109  CO2 23 22 21* 21*  GLUCOSE 112* 121* 109* 89  BUN 16 12 10  25*  CALCIUM  8.0* 8.0* 8.1* 8.8*  CREATININE 1.01 0.99 1.21 1.22  GFRNONAA >60 >60 >60 >60    LIVER FUNCTION TESTS: Recent Labs    04/21/23 1304 05/12/23 0928 06/29/23 1136 08/17/23 1140  BILITOT 0.5 1.2 0.6 0.9  AST 46* 54* 29 34  ALT 25 27 18 19   ALKPHOS 67 83 83 83  PROT 6.4* 6.4* 6.5 7.4  ALBUMIN 3.4* 3.7 3.4* 4.3    TUMOR MARKERS: No results for input(s): AFPTM, CEA, CA199, CHROMGRNA in the last 8760 hours.  Assessment and Plan: Paul Briggs is followed by Oncology service for chronic lymphocytic leukemia, (diagnosed on 01/01/22), and was recently taken off Brukinsa .  An incidental bony lesion was found on the cervical spine, further evaluated with an MRI, revealing a lesion at the C4 level on the right side. Dr. Babara has requested for bone marrow biopsy and aspiration. Patient is scheduled for same in IR today.  Patient presents for scheduled bone marrow biopsy and aspiration in IR  today.  Patient has been NPO since midnight.  All labs and medications are within acceptable parameters.  No pertinent allergies.   Risks and benefits of bone marrow biopsy and aspiration was discussed with the patient and/or patient's family including, but not limited to bleeding, infection, damage to adjacent structures or low yield requiring additional tests.  All of the questions were answered and there is agreement to proceed.  Consent signed and in chart.    Thank you for allowing our service to participate in Paul Gamel [name] 's care.  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

## 2023-08-18 NOTE — Progress Notes (Signed)
 Patient for IR Bone Marrow Biopsy on Friday 08/19/23, I called and spoke with the patient's daughter, [name] on the phone and gave pre-procedure instructions. Malisa was made aware to have the patient here at 7:30a, NPO after MN prior to procedure as well as driver post procedure/recovery/discharge. Malisa stated understanding.  Called 08/18/23

## 2023-08-18 NOTE — Telephone Encounter (Signed)
Attempted to call patient to clarify

## 2023-08-18 NOTE — Telephone Encounter (Signed)
 Spoke with pharmacy, patient had tried to fill it before 30 days from the last one, but he may fill it now.  Patient notified.

## 2023-08-19 ENCOUNTER — Other Ambulatory Visit: Payer: Self-pay

## 2023-08-19 ENCOUNTER — Encounter: Payer: Self-pay | Admitting: Radiology

## 2023-08-19 ENCOUNTER — Ambulatory Visit
Admission: RE | Admit: 2023-08-19 | Discharge: 2023-08-19 | Disposition: A | Discharge status: Home / Self Care | Source: Ambulatory Visit | Attending: Oncology | Admitting: Oncology

## 2023-08-19 VITALS — BP 147/95 | HR 55 | Temp 97.2°F | Resp 8 | Ht 73.0 in | Wt 218.8 lb

## 2023-08-19 DIAGNOSIS — I447 Left bundle-branch block, unspecified: Secondary | ICD-10-CM | POA: Diagnosis not present

## 2023-08-19 DIAGNOSIS — Z1379 Encounter for other screening for genetic and chromosomal anomalies: Secondary | ICD-10-CM | POA: Insufficient documentation

## 2023-08-19 DIAGNOSIS — Z87891 Personal history of nicotine dependence: Secondary | ICD-10-CM | POA: Insufficient documentation

## 2023-08-19 DIAGNOSIS — I2510 Atherosclerotic heart disease of native coronary artery without angina pectoris: Secondary | ICD-10-CM | POA: Insufficient documentation

## 2023-08-19 DIAGNOSIS — M899 Disorder of bone, unspecified: Secondary | ICD-10-CM | POA: Diagnosis not present

## 2023-08-19 DIAGNOSIS — R296 Repeated falls: Secondary | ICD-10-CM | POA: Diagnosis not present

## 2023-08-19 DIAGNOSIS — J449 Chronic obstructive pulmonary disease, unspecified: Secondary | ICD-10-CM | POA: Diagnosis not present

## 2023-08-19 DIAGNOSIS — D631 Anemia in chronic kidney disease: Secondary | ICD-10-CM | POA: Insufficient documentation

## 2023-08-19 DIAGNOSIS — N183 Chronic kidney disease, stage 3 unspecified: Secondary | ICD-10-CM | POA: Diagnosis not present

## 2023-08-19 DIAGNOSIS — I428 Other cardiomyopathies: Secondary | ICD-10-CM | POA: Diagnosis not present

## 2023-08-19 DIAGNOSIS — I5022 Chronic systolic (congestive) heart failure: Secondary | ICD-10-CM | POA: Diagnosis not present

## 2023-08-19 DIAGNOSIS — G4733 Obstructive sleep apnea (adult) (pediatric): Secondary | ICD-10-CM | POA: Diagnosis not present

## 2023-08-19 DIAGNOSIS — G894 Chronic pain syndrome: Secondary | ICD-10-CM | POA: Insufficient documentation

## 2023-08-19 DIAGNOSIS — E785 Hyperlipidemia, unspecified: Secondary | ICD-10-CM | POA: Insufficient documentation

## 2023-08-19 DIAGNOSIS — D6489 Other specified anemias: Secondary | ICD-10-CM | POA: Diagnosis not present

## 2023-08-19 DIAGNOSIS — C9110 Chronic lymphocytic leukemia of B-cell type not having achieved remission: Secondary | ICD-10-CM | POA: Insufficient documentation

## 2023-08-19 DIAGNOSIS — Z7984 Long term (current) use of oral hypoglycemic drugs: Secondary | ICD-10-CM | POA: Insufficient documentation

## 2023-08-19 DIAGNOSIS — I130 Hypertensive heart and chronic kidney disease with heart failure and stage 1 through stage 4 chronic kidney disease, or unspecified chronic kidney disease: Secondary | ICD-10-CM | POA: Diagnosis not present

## 2023-08-19 DIAGNOSIS — E1122 Type 2 diabetes mellitus with diabetic chronic kidney disease: Secondary | ICD-10-CM | POA: Insufficient documentation

## 2023-08-19 HISTORY — PX: IR BONE MARROW BIOPSY & ASPIRATION: IMG5727

## 2023-08-19 LAB — CBC WITH DIFFERENTIAL/PLATELET
Abs Immature Granulocytes: 0 K/uL (ref 0.00–0.07)
Basophils Absolute: 0.1 K/uL (ref 0.0–0.1)
Basophils Relative: 1 %
Eosinophils Absolute: 0.2 K/uL (ref 0.0–0.5)
Eosinophils Relative: 3 %
HCT: 33.2 % — ABNORMAL LOW (ref 39.0–52.0)
Hemoglobin: 10.4 g/dL — ABNORMAL LOW (ref 13.0–17.0)
Immature Granulocytes: 0 %
Lymphocytes Relative: 47 %
Lymphs Abs: 3.3 K/uL (ref 0.7–4.0)
MCH: 28.9 pg (ref 26.0–34.0)
MCHC: 31.3 g/dL (ref 30.0–36.0)
MCV: 92.2 fL (ref 80.0–100.0)
Monocytes Absolute: 0.6 K/uL (ref 0.1–1.0)
Monocytes Relative: 9 %
Neutro Abs: 2.8 K/uL (ref 1.7–7.7)
Neutrophils Relative %: 40 %
Platelets: 158 K/uL (ref 150–400)
RBC: 3.6 MIL/uL — ABNORMAL LOW (ref 4.22–5.81)
RDW: 19 % — ABNORMAL HIGH (ref 11.5–15.5)
WBC: 6.9 K/uL (ref 4.0–10.5)
nRBC: 0 % (ref 0.0–0.2)

## 2023-08-19 LAB — MULTIPLE MYELOMA PANEL, SERUM
Albumin SerPl Elph-Mcnc: 3.9 g/dL (ref 2.9–4.4)
Albumin/Glob SerPl: 1.4 (ref 0.7–1.7)
Alpha 1: 0.2 g/dL (ref 0.0–0.4)
Alpha2 Glob SerPl Elph-Mcnc: 0.6 g/dL (ref 0.4–1.0)
B-Globulin SerPl Elph-Mcnc: 0.9 g/dL (ref 0.7–1.3)
Gamma Glob SerPl Elph-Mcnc: 1.4 g/dL (ref 0.4–1.8)
Globulin, Total: 3 g/dL (ref 2.2–3.9)
IgA: 176 mg/dL (ref 61–437)
IgG (Immunoglobin G), Serum: 1406 mg/dL (ref 603–1613)
IgM (Immunoglobulin M), Srm: 58 mg/dL (ref 15–143)
Total Protein ELP: 6.9 g/dL (ref 6.0–8.5)

## 2023-08-19 LAB — GLUCOSE, CAPILLARY: Glucose-Capillary: 99 mg/dL (ref 70–99)

## 2023-08-19 MED ORDER — MIDAZOLAM HCL 2 MG/2ML IJ SOLN
INTRAMUSCULAR | Status: AC | PRN
Start: 2023-08-19 — End: 2023-08-19
  Administered 2023-08-19: 1 mg via INTRAVENOUS

## 2023-08-19 MED ORDER — MIDAZOLAM HCL 5 MG/5ML IJ SOLN
INTRAMUSCULAR | Status: AC | PRN
  Administered 2023-08-19: .5 mg via INTRAVENOUS

## 2023-08-19 MED ORDER — FENTANYL CITRATE (PF) 100 MCG/2ML IJ SOLN
INTRAMUSCULAR | Status: AC
Start: 2023-08-19 — End: 2023-08-19
  Filled 2023-08-19: qty 2

## 2023-08-19 MED ORDER — HEPARIN SOD (PORK) LOCK FLUSH 100 UNIT/ML IV SOLN
INTRAVENOUS | Status: AC
  Filled 2023-08-19: qty 5

## 2023-08-19 MED ORDER — HYDROCODONE-ACETAMINOPHEN 5-325 MG PO TABS: 1.0000 | ORAL_TABLET | ORAL | Status: DC | PRN

## 2023-08-19 MED ORDER — LIDOCAINE 1 % OPTIME INJ - NO CHARGE
10.0000 mL | Freq: Once | INTRAMUSCULAR | Status: AC
  Administered 2023-08-19: 10 mL via INTRADERMAL
  Filled 2023-08-19: qty 10

## 2023-08-19 MED ORDER — MIDAZOLAM HCL 2 MG/2ML IJ SOLN
INTRAMUSCULAR | Status: AC
  Filled 2023-08-19: qty 2

## 2023-08-19 MED ORDER — FENTANYL CITRATE (PF) 100 MCG/2ML IJ SOLN
INTRAMUSCULAR | Status: AC | PRN
  Administered 2023-08-19: 25 ug via INTRAVENOUS
  Administered 2023-08-19: 50 ug via INTRAVENOUS

## 2023-08-19 NOTE — Progress Notes (Signed)
 Patient clinically stable post IR BMB per Dr Johann, tolerated well. Vitals stable pre and post procedure. Received Versed  1.5 mg along with Fentanyl  75 mcg IV for procedure. Report given to Laymon Fish RN post procedure/specials/17

## 2023-08-19 NOTE — Procedures (Signed)
  Procedure:  FLuoro guided bone marrow biopsy  R iliac Preprocedure diagnosis: The primary encounter diagnosis was CLL (chronic lymphocytic leukemia) (HCC). A diagnosis of Bone lesion was also pertinent to this visit. Postprocedure diagnosis: same EBL:    minimal Complications:   none immediate  See full dictation in [employer].  CHARM Toribio Faes MD Main # [phone] Pager  [phone] Mobile [phone]

## 2023-08-22 LAB — COMP PANEL: LEUKEMIA/LYMPHOMA

## 2023-08-23 LAB — SURGICAL PATHOLOGY

## 2023-08-24 ENCOUNTER — Encounter: Payer: Self-pay | Admitting: Oncology

## 2023-08-24 ENCOUNTER — Inpatient Hospital Stay (HOSPITAL_BASED_OUTPATIENT_CLINIC_OR_DEPARTMENT_OTHER): Admitting: Oncology

## 2023-08-24 ENCOUNTER — Inpatient Hospital Stay

## 2023-08-24 ENCOUNTER — Other Ambulatory Visit: Payer: Self-pay

## 2023-08-24 VITALS — BP 104/80 | HR 62 | Temp 97.7°F | Resp 16 | Wt 215.0 lb

## 2023-08-24 VITALS — BP 127/71 | HR 57 | Temp 96.0°F | Resp 17

## 2023-08-24 DIAGNOSIS — Z5111 Encounter for antineoplastic chemotherapy: Secondary | ICD-10-CM | POA: Diagnosis not present

## 2023-08-24 DIAGNOSIS — C9110 Chronic lymphocytic leukemia of B-cell type not having achieved remission: Secondary | ICD-10-CM

## 2023-08-24 DIAGNOSIS — N1831 Chronic kidney disease, stage 3a: Secondary | ICD-10-CM

## 2023-08-24 DIAGNOSIS — R630 Anorexia: Secondary | ICD-10-CM

## 2023-08-24 DIAGNOSIS — D509 Iron deficiency anemia, unspecified: Secondary | ICD-10-CM | POA: Diagnosis not present

## 2023-08-24 MED ORDER — IRON SUCROSE 20 MG/ML IV SOLN
200.0000 mg | Freq: Once | INTRAVENOUS | Status: AC
  Administered 2023-08-24: 200 mg via INTRAVENOUS
  Filled 2023-08-24: qty 10

## 2023-08-24 NOTE — Assessment & Plan Note (Signed)
 Continue treatment as mentioned above.

## 2023-08-24 NOTE — Assessment & Plan Note (Addendum)
 Labs are reviewed and discussed with patient. CT chest abdomen pelvis results reviewed and discussed with patient.   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 Bone marrow biopsy results reviewed with patient.  80% of bone marrow is involved with CLL. His cytopenia has improved.  Recommend patient to resume Zanubrutinib  160 mg twice daily, .

## 2023-08-24 NOTE — Assessment & Plan Note (Signed)
 Encourage oral hydration and avoid nephrotoxins.

## 2023-08-24 NOTE — Progress Notes (Signed)
 Hematology/Oncology Progress note [phone] [phone]        ASSESSMENT & PLAN:   Cancer Staging  CLL (chronic lymphocytic leukemia) (HCC) Staging form: Chronic Lymphocytic Leukemia / Small Lymphocytic Lymphoma, AJCC 8th Edition - Clinical stage from 12/28/2021: Modified Rai Stage III (Modified Rai risk: High, Lymphocytosis: Present, Adenopathy: Present, Organomegaly: Absent, Anemia: Present, Thrombocytopenia: Absent) - Signed by Babara Call, MD on 01/20/2022   CLL (chronic lymphocytic leukemia) [facility]) Labs are reviewed and discussed with patient. CT chest abdomen pelvis results reviewed and discussed with patient.   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 Bone marrow biopsy results reviewed with patient.  80% of bone marrow is involved with CLL. His cytopenia has improved.  Recommend patient to resume Zanubrutinib  160 mg twice daily, .   Encounter for antineoplastic chemotherapy Continue treatment as mentioned above.   IDA (iron  deficiency anemia) Anemia is likely multifactorial, could be due to CLL, anemia due to chronic kidney disease and IDA. Lab Results  Component Value Date   HGB 10.4 (L) 08/19/2023   TIBC 307 08/17/2023   IRONPCTSAT 15 (L) 08/17/2023   FERRITIN 24 08/17/2023   recurrent iron  deficiency anemia.   Recommend Venofer  weekly x 3 Colonoscopy findings were reviewed. No obvious source of bleeding.      Orders Placed This Encounter  Procedures   CMP (Cancer Center only)    Standing Status:   Future    Expected Date:   10/05/2023    Expiration Date:   01/03/2024   CBC with Differential (Cancer Center Only)    Standing Status:   Future    Expected Date:   10/05/2023    Expiration Date:   01/03/2024   Retic Panel    Standing Status:   Future    Expected Date:   10/05/2023    Expiration Date:   01/03/2024   Ambulatory Referral to [facility]     Referral Priority:   Routine    Referral Type:   Consultation    Referral Reason:   Specialty Services Required    Number of Visits Requested:   1   Follow up   6 weeks lab Md Venofer   All questions were answered. The patient knows to call the clinic with any problems, questions or concerns.  Call Babara, MD, PhD [facility] Hematology Oncology 08/24/2023    CHIEF COMPLAINTS/PURPOSE OF CONSULTATION:  CLL  HISTORY OF PRESENTING ILLNESS:  Paul Nadara Browner [name] 78 y.o. male presents for follow up of CLL I have reviewed his chart and materials related to his cancer extensively and collaborated history with the patient. Summary of oncologic history is as follows: Oncology History  CLL (chronic lymphocytic leukemia) (HCC)  12/25/2021 - 12/26/2021 Hospital Admission   Patient was admitted due to pain and swelling of right ankle, right knee and left wrist.  Patient was found to have WBC 30.4, with neutrophil 29% and lymphocyte 2%.  Peripheral smear showed leukocytosis with slight left shift in myeloid series and lymphocytosis with abnormal morphology.  Uric acid 5.5, worsening renal function with creatinine 1.35, BUN 18, GFR 55 (baseline creatinine 1.08 on 07/06/2021), lactic acid 1.1.  ESR normal, uric acid 5.5 normal, CRP 22.2, LDH 182  Orthopedic surgery was consulted s/p arthrocentesis, cell count 296 with 72% neutrophil, no crystal. Fluid negative for growth.  Patient received cefepime  and vancomycin  while in the hospital, Patient was discharged on Keflex  for 5 days.  12/28/2021 Cancer Staging   Staging form: Chronic Lymphocytic Leukemia / Small Lymphocytic Lymphoma, AJCC 8th Edition - Clinical stage from 12/28/2021: Modified Rai Stage III (Modified Rai risk: High, Lymphocytosis: Present, Adenopathy: Present, Organomegaly: Absent, Anemia: Present, Thrombocytopenia: Absent) - Signed by Babara Call, MD on 01/20/2022 Stage prefix: Initial diagnosis Hemoglobin (Hgb) (g/dL): 89.8   87/01/7974  Initial Diagnosis   CLL (chronic lymphocytic leukemia)   12/27/21 peripheral blood flowcytometry showed Involvement by CD5+, CD23+, CD20+, CD22+ clonal B cell population, phenotype typical for 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    01/05/2022 Imaging   CT chest abdomen pelvis wo contrast 1. Multiple prominent borderline enlarged and mildly enlarged lymph nodes, most evident in the low anatomic pelvis, as above, compatible with reported clinical history of CLL. 2. There also several small pulmonary nodules in the lungs measuring 5 mm or less in size. This is nonspecific, but statistically likely benign. No follow-up needed if patient is low-risk (and has no known or suspected primary neoplasm). Non-contrast chest CT can be considered in 12 months if patient is high-risk. This recommendation 3. Aortic atherosclerosis, in addition to left main and 2 vessel coronary artery disease. Please note that although the presence of coronary artery calcium  documents the presence of coronary artery disease, the severity of this disease and any potential stenosis cannot be assessed on this non-gated CT examination. Assessment for  potential risk factor modification, dietary therapy or pharmacologic therapy may be warranted, if clinically indicated. 4. There are calcifications of the aortic valve. Echocardiographic correlation for evaluation of potential valvular dysfunction may be warranted if clinically indicated. 5. 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 6. Diverticulosis without evidence of acute diverticulitis at this time. 7. Mild cardiomegaly.   08/26/2022 Imaging   CT abdomen pelvis w contrast  1. Subcutaneous fat stranding within the left lower quadrant anterior abdominal wall extending into the anterior proximal left thigh,  likely related to contusion given history of recent trauma. No fluid collection or hematoma. 2. No acute displaced fracture. 3. Retroperitoneal and pelvic lymphadenopathy as above, with waxing and waning appearance. 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 4. 1.9 cm hypodensity within the medial aspect of the spleen,nonspecific. Leukemic involvement of the spleen cannot be excluded. 5. Distal colonic diverticulosis without diverticulitis. 6.  Aortic Atherosclerosis    07/24/2023 Imaging   MRI cervical spine with and without contrast  1. Enhancing and indeterminate right C4 posterior element/facet bone lesion, which is superimposed on diffusely abnormal but nonspecific generalized decreased T1 marrow signal in the visible spine and at the skull base.   Marrow infiltrative processes such as myelofibrosis or lymphoma cannot be excluded. But the absence of other suspicious marrow edema or enhancement argues against metastatic disease or multiple myeloma. And furthermore similar generalized abnormal marrow signal can be caused by chronic anemia, smoking, obesity.   2. Underlying severe chronic cervical spine degeneration with reversal of lordosis and multifactorial degenerative cervical spinal stenosis at all levels. Associated moderate spinal stenosis and moderate (up to severe) spinal cord mass effect C3-C4 through C5-C6. No associated spinal cord edema or myelomalacia. Associated moderate or severe degenerative neural foraminal stenosis at the bilateral C4 through C8 nerve levels.       08/19/2023 Bone Marrow Biopsy   Bone marrow biopsy  aspirate clot core  - Hypercellular bone marrow (80%) involved by the  patient's known  chronic lymphocytic leukemia at approximately 50% of the cellular  marrow.      08/23/2022 Colonoscopy showed - Likely malignant partially obstructing tumor in the distal ascending colon. Biopsied. Tattooed. - Three 4 to 6 mm polyps in the  transverse colon and in the ascending colon, removed with a cold snare. Resected and retrieved. - Five 3 to 7 mm polyps in the rectum, in the descending colon, in the transverse colon and in the ascending colon, removed with a hot snare. Resected and retrieved. 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 - Non- bleeding internal hemorrhoids.  Pathology showed 1. Ascending Colon Polyp, x2 cold snare - TUBULAR ADENOMA(S) (MULTIPLE FRAGMENTS) - NEGATIVE FOR HIGH-GRADE DYSPLASIA OR MALIGNANCY 2. Ascending Colon Polyp, hot snare - TUBULAR ADENOMA (MULTIPLE FRAGMENTS) - NEGATIVE FOR HIGH-GRADE DYSPLASIA OR MALIGNANCY 3. Ascending Colon Biopsy, mass cbx - TUBULAR ADENOMA (MULTIPLE FRAGMENTS) - SEE NOTE 4. Transverse Colon Polyp, hot snare; cold snare - TUBULAR ADENOMA (MULTIPLE FRAGMENTS) - NEGATIVE FOR HIGH-GRADE DYSPLASIA OR MALIGNANCY 5. Descending Colon Polyp, x2 hot snare - TUBULAR ADENOMA(S) (MULTIPLE FRAGMENTS) - NEGATIVE FOR HIGH-GRADE DYSPLASIA OR MALIGNANCY 6. Rectum, polyp(s), hot snare - TUBULOVILLOUS ADENOMA (MULTIPLE FRAGMENTS) - NEGATIVE FOR HIGH-GRADE DYSPLASIA OR MALIGNANCY I&D Recent admission due to sepsis due to cellulitis, left groin abscess, s/p I&D  04/24/2023 s/p colonoscopy, removal of small polyps.  04/26/2023 CT chest wo contrast  Pulmonary infiltrates as described and left pleural effusion correlate with bronchopneumonia. Follow-up recommended as clinically needed. Small pericardial effusion, correlation with echocardiography recommended as clinically needed.  04/27/2023 CT chest PE angiogram  No definite evidence of pulmonary embolus. Small left pleural effusion is noted. Airspace opacities are noted in lingular segment of left upper lobe as well as in left lower lobe most consistent with pneumonia. Minimal pericardial effusion. Aortic Atherosclerosis   Recent hospitalization due to right knee pain due to septic prepatella bursitis of right knee. He was treated wit IV  abx. S/p FNA aspiration, culture was negative.   INTERVAL HISTORY Amilcar Reever [name] is a 78 y.o. male who has above history reviewed by me today presents for follow up visit for CLL, iron  deficiency anemia, colon tubular adenoma with high-grade dysplastic status post resection. He feels well today.   He denies fever or chills.  Fatigue has improved.  Patient is status post bone marrow biopsy and presents to discuss results.     MEDICAL HISTORY:  Past Medical History:  Diagnosis Date   Adenoma of left adrenal gland    Anemia    Aortic atherosclerosis (HCC)    Blood transfusion without reported diagnosis    BPH (benign prostatic hyperplasia)    Bradycardia    CAD (coronary artery disease) 08/26/2022   a.) cCTA 08/26/2022: Ca2+ 14.3 (12th %'ile for age/sex.race match control); (<25%) pLAD   Cellulitis of left thigh 08/2022   Chronic pain syndrome    a.) on COT managed by pain management   Chronic, continuous use of opioids    a.) chronic pain syndrome/chronic back pain; managed by pain management   CKD (chronic kidney disease) stage 3, GFR 30-59 ml/min (HCC)    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   a.) Rai stage III   DDD (degenerative disc disease), cervical    Difficult airway 10/22/2015   a.) 1st attempt with glidescope  --> macroglossic (unsuccessful); b.) 2nd attempt with Mac 4 and direct laryngoscopy with a 8.0 ETT --> tube too large to pass through the cords; b.) 3rd  attempt (successful) with a Mac 4 and a 7.5 tube   Diverticulosis    DM (diabetes mellitus), type 2 (HCC)    Dysplasia of prostate    Erectile dysfunction    Frequent falls    Heart failure with mildly reduced ejection fraction (HFmrEF) (HCC)    a.) TTE 10/23/2015: EF 60-65%, mod-sev LVH, mild biatrial dil, degen MV disease, AoV sclerosis, asc Ao 38 mm, G1DD; b.) TTE 08/05/2022: EF 45-50%, mod-sev LVH (speckled pattern), sev biatrial dil, mild MR, AoV sclerosis, G1DD   Hepatic flexure  mass 08/23/2022   a.) colonoscopy 08/23/2022: 14 x 15 mm partially obstructing ulcerated distal ascending colon mass; tissue friable --> pathology resulted as tubular adenoma, however felt to be false (-) --> referral to sugery and colectomy recommended; b.) s/p RIGHT hemicolectomy 09/28/2022   History of MRSA infection 08/28/2022   a.) MRSA PCR (+) 08/28/2022; culture from LEFT thigh abscess   HTN (hypertension)    Hyperlipidemia    Hypogonadism in male    Hypothyroidism    IDA (iron  deficiency anemia)    LBBB (left bundle branch block)    Long-term use of aspirin  therapy    Lumbar spinal stenosis    Mild cardiomegaly    Multiple lung nodules on CT    NICM (nonischemic cardiomyopathy) (HCC)    a.) TTE 10/23/2015: EF 60-65; b.) TTE 08/05/2022: EF 45-50%   Orchitis and epididymitis 06/16/2013   OSA on CPAP    Palindromic rheumatism, hand    Pericardial effusion    Prostatitis    Sepsis (HCC) 08/2022   Subdural hematoma (HCC) 10/22/2015   a.) s/p traumatic mechanical fall --> CT head 10/22/2015: high-density SDH the left cerebral convexity with13 mm midline shift --> s/p LEFT frontal burr hole craniotomy   Tubular adenoma of colon    Ventral hernia     SURGICAL HISTORY: Past Surgical History:  Procedure Laterality Date   BACK SURGERY     BIOPSY  08/21/2022   Procedure: BIOPSY;  Surgeon: Onita Elspeth Sharper, DO;  Location: [facility] ENDOSCOPY;  Service: Gastroenterology;;   BIOPSY  08/23/2022   Procedure: BIOPSY;  Surgeon: Onita Elspeth Sharper, DO;  Location: [facility] ENDOSCOPY;  Service: Gastroenterology;;   BIOPSY  02/28/2023   Procedure: BIOPSY;  Surgeon: Onita Elspeth Sharper, DO;  Location: [facility] ENDOSCOPY;  Service: Gastroenterology;;   SOLMON DAKIN OF CRANIUM Left 10/22/2015   COLON SURGERY  09/28/2022   COLONOSCOPY WITH PROPOFOL  N/A 08/23/2022   Procedure: COLONOSCOPY WITH PROPOFOL ;  Surgeon: Onita Elspeth Sharper, DO;  Location: [facility] ENDOSCOPY;  Service: Gastroenterology;   Laterality: N/A;   COLONOSCOPY WITH PROPOFOL  N/A 02/28/2023   Procedure: COLONOSCOPY WITH PROPOFOL ;  Surgeon: Onita Elspeth Sharper, DO;  Location: [facility] ENDOSCOPY;  Service: Gastroenterology;  Laterality: N/A;  DM   COLONOSCOPY WITH PROPOFOL  N/A 04/25/2023   Procedure: COLONOSCOPY WITH PROPOFOL ;  Surgeon: Onita Elspeth Sharper, DO;  Location: [facility] ENDOSCOPY;  Service: Gastroenterology;  Laterality: N/A;  DM   ESOPHAGOGASTRODUODENOSCOPY (EGD) WITH PROPOFOL  N/A 08/21/2022   Procedure: ESOPHAGOGASTRODUODENOSCOPY (EGD) WITH PROPOFOL ;  Surgeon: Onita Elspeth Sharper, DO;  Location: [facility] ENDOSCOPY;  Service: Gastroenterology;  Laterality: N/A;   ESOPHAGOGASTRODUODENOSCOPY (EGD) WITH PROPOFOL  N/A 02/28/2023   Procedure: ESOPHAGOGASTRODUODENOSCOPY (EGD) WITH PROPOFOL ;  Surgeon: Onita Elspeth Sharper, DO;  Location: [facility] ENDOSCOPY;  Service: Gastroenterology;  Laterality: N/A;   GIVENS CAPSULE STUDY N/A 08/23/2022   Procedure: GIVENS CAPSULE STUDY;  Surgeon: Onita Elspeth Sharper, DO;  Location: [facility] ENDOSCOPY;  Service: Gastroenterology;  Laterality: N/A;   HERNIA  REPAIR     INGUINAL HERNIA REPAIR Right 03/09/2023   Procedure: HERNIA REPAIR INGUINAL ADULT, open, RNFA to assist;  Surgeon: Jordis Laneta FALCON, MD;  Location: [facility] ORS;  Service: General;  Laterality: Right;   IR BONE MARROW BIOPSY & ASPIRATION  08/19/2023   IR US  GUIDE BX ASP/DRAIN  06/27/2023   IRRIGATION AND DEBRIDEMENT ABSCESS Left 08/28/2022   Procedure: IRRIGATION AND DEBRIDEMENT ABSCESS LEFT UPPER THIGH/GROIN;  Surgeon: Jordis Laneta FALCON, MD;  Location: [facility] ORS;  Service: General;  Laterality: Left;   LAPAROSCOPIC RIGHT COLECTOMY Right 09/28/2022   Procedure: LAPAROSCOPIC RIGHT COLECTOMY, RNFA to assist;  Surgeon: Jordis Laneta FALCON, MD;  Location: [facility] ORS;  Service: General;  Laterality: Right;   POLYPECTOMY  08/23/2022   Procedure: POLYPECTOMY;  Surgeon: Onita Elspeth Sharper, DO;  Location: [facility] ENDOSCOPY;  Service: Gastroenterology;;    POLYPECTOMY  04/25/2023   Procedure: POLYPECTOMY;  Surgeon: Onita Elspeth Sharper, DO;  Location: [facility] ENDOSCOPY;  Service: Gastroenterology;;   SUBMUCOSAL TATTOO INJECTION  08/23/2022   Procedure: SUBMUCOSAL TATTOO INJECTION;  Surgeon: Onita Elspeth Sharper, DO;  Location: [facility] ENDOSCOPY;  Service: Gastroenterology;;   TONSILLECTOMY     VENTRAL HERNIA REPAIR N/A 09/28/2022   Procedure: HERNIA REPAIR VENTRAL ADULT;  Surgeon: Jordis Laneta FALCON, MD;  Location: [facility] ORS;  Service: General;  Laterality: N/A;    SOCIAL HISTORY: Social History   Socioeconomic History   Marital status: Married    Spouse name: Not on file   Number of children: Not on file   Years of education: Not on file   Highest education level: 5th grade  Occupational History   Not on file  Tobacco Use   Smoking status: Former    Types: Cigarettes    Passive exposure: Past   Smokeless tobacco: Never   Tobacco comments:    Stop smoking iver 40  plus years ago  Vaping Use   Vaping status: Never Used  Substance and Sexual Activity   Alcohol use: No   Drug use: No   Sexual activity: Not Currently  Other Topics Concern   Not on file  Social History Narrative   Not on file   Social Drivers of Health   Financial Resource Strain: Patient Declined (04/26/2023)   Received [facility]   Overall Financial Resource Strain (CARDIA)    Difficulty of Paying Living Expenses: Patient declined  Recent Concern: Financial Resource Strain - Medium Risk (02/24/2023)   Received from [facility]   Overall Financial Resource Strain (CARDIA)    Difficulty of Paying Living Expenses: Somewhat hard  Food Insecurity: No Food Insecurity (06/23/2023)   Hunger Vital Sign    Worried About Running Out of Food in the Last Year: Never true    Ran Out of Food in the Last Year: Never true  Transportation Needs: Patient Declined (08/07/2023)   Received from [facility] -  Transportation    In the past 12 months, has lack of transportation kept you from medical appointments or from getting medications?: Patient declined    Lack of Transportation (Non-Medical): Patient declined  Physical Activity: Unknown (07/26/2022)   Exercise Vital Sign    Days of Exercise per Week: Patient declined    Minutes of Exercise per Session: Not on file  Stress: Stress Concern Present (07/26/2022)   [employer] of Occupational Health - Occupational Stress Questionnaire    Feeling of Stress : To some extent  Social Connections: Socially Integrated (06/23/2023)  Social Advertising account executive    Frequency of Communication with Friends and Family: More than three times a week    Frequency of Social Gatherings with Friends and Family: Patient declined    Attends Religious Services: More than 4 times per year    Active Member of [employer] or Organizations: Yes    Attends Banker Meetings: Never    Marital Status: Married  Catering manager Violence: Not At Risk (06/23/2023)   Humiliation, Afraid, Rape, and Kick questionnaire    Fear of Current or Ex-Partner: No    Emotionally Abused: No    Physically Abused: No    Sexually Abused: No    FAMILY HISTORY: Family History  Problem Relation Age of Onset   Cancer Maternal Aunt    ADD / ADHD Daughter    Asthma Son    Prostate cancer Neg Hx    Kidney disease Neg Hx    Kidney cancer Neg Hx    Bladder Cancer Neg Hx     ALLERGIES:  has no known allergies.  MEDICATIONS:  Current Outpatient Medications  Medication Sig Dispense Refill   acetaminophen  (TYLENOL ) 325 MG tablet Take 2 tablets (650 mg total) by mouth every 6 (six) hours as needed for mild pain (pain score 1-3) or fever.     albuterol  (VENTOLIN  HFA) 108 (90 Base) MCG/ACT inhaler Inhale 2 puffs into the lungs every 4 (four) hours as needed for wheezing or shortness of breath.     aspirin  EC 81 MG tablet Take 1 tablet (81 mg total) by mouth daily. Swallow  whole.     empagliflozin  (JARDIANCE ) 10 MG TABS tablet Take 1 tablet (10 mg total) by mouth daily before breakfast. 90 tablet 2   fluticasone -salmeterol (ADVAIR) 250-50 MCG/ACT AEPB Inhale 1 puff into the lungs in the morning and at bedtime.     furosemide  (LASIX ) 20 MG tablet Take 20 mg by mouth daily as needed.     hydrALAZINE  (APRESOLINE ) 50 MG tablet Take 2 tablets (100 mg total) by mouth 2 (two) times daily. 180 tablet 3   ipratropium (ATROVENT) 0.03 % nasal spray Place 2 sprays into the nose 2 (two) times daily.     levothyroxine  (SYNTHROID ) 200 MCG tablet Take 1 tablet (200 mcg total) by mouth daily before breakfast. 90 tablet 0   metFORMIN  (GLUCOPHAGE ) 500 MG tablet Take 2 tablets (1,000 mg total) by mouth 2 (two) times daily with a meal. 120 tablet 0   metoprolol  succinate (TOPROL  XL) 25 MG 24 hr tablet Take 1 tablet (25 mg total) by mouth daily. 90 tablet 1   oxyCODONE -acetaminophen  (PERCOCET) 10-325 MG tablet Take 1 tablet by mouth every 8 (eight) hours as needed for pain. Must last 30 days. 90 tablet 0   rosuvastatin  (CRESTOR ) 40 MG tablet Take 1 tablet (40 mg total) by mouth daily. 90 tablet 1   sacubitril -valsartan  (ENTRESTO ) 97-103 MG Take 1 tablet by mouth 2 (two) times daily. 120 tablet 3   spironolactone  (ALDACTONE ) 25 MG tablet Take 0.5 tablets (12.5 mg total) by mouth daily. 45 tablet 3   zanubrutinib  (BRUKINSA ) 80 MG capsule Take 2 capsules (160 mg total) by mouth 2 (two) times daily. 120 capsule 2   No current facility-administered medications for this visit.    Review of Systems  Constitutional:  Positive for fatigue. Negative for appetite change, chills, fever and unexpected weight change.  HENT:   Negative for hearing loss and voice change.   Eyes:  Negative for eye  problems and icterus.  Respiratory:  Negative for chest tightness, cough and shortness of breath.   Cardiovascular:  Negative for chest pain and leg swelling.  Gastrointestinal:  Negative for abdominal  distention and abdominal pain.  Endocrine: Negative for hot flashes.  Genitourinary:  Negative for difficulty urinating, dysuria and frequency.   Musculoskeletal:  Positive for arthralgias.  Skin:  Negative for itching and rash.  Neurological:  Negative for light-headedness and numbness.  Hematological:  Negative for adenopathy. Does not bruise/bleed easily.  Psychiatric/Behavioral:  Negative for confusion.      PHYSICAL EXAMINATION: ECOG PERFORMANCE STATUS: 1 - Symptomatic but completely ambulatory  Vitals:   08/24/23 1433  BP: 104/80  Pulse: 62  Resp: 16  Temp: 97.7 F (36.5 C)  SpO2: 99%   Filed Weights   08/24/23 1433  Weight: 215 lb (97.5 kg)    Physical Exam Constitutional:      General: He is not in acute distress.    Appearance: He is obese. He is not diaphoretic.  HENT:     Head: Normocephalic and atraumatic.  Eyes:     General: No scleral icterus.    Pupils: Pupils are equal, round, and reactive to light.  Cardiovascular:     Rate and Rhythm: Normal rate.     Heart sounds: No murmur heard. Pulmonary:     Effort: Pulmonary effort is normal. No respiratory distress.     Breath sounds: No wheezing.  Abdominal:     General: There is no distension.     Palpations: Abdomen is soft.  Genitourinary:    Comments:   Musculoskeletal:     Cervical back: Normal range of motion and neck supple.  Skin:    General: Skin is warm and dry.     Findings: No erythema.  Neurological:     Mental Status: He is alert and oriented to person, place, and time. Mental status is at baseline.     Cranial Nerves: No cranial nerve deficit.     Motor: No abnormal muscle tone.  Psychiatric:        Mood and Affect: Mood and affect normal.     LABORATORY DATA:  I have reviewed the data as listed    Latest Ref Rng & Units 08/19/2023    7:57 AM 08/17/2023   11:40 AM 06/29/2023   11:36 AM  CBC  WBC 4.0 - 10.5 K/uL 6.9  7.0  5.3   Hemoglobin 13.0 - 17.0 g/dL 89.5  89.4  9.4    Hematocrit 39.0 - 52.0 % 33.2  33.1  30.1   Platelets 150 - 400 K/uL 158  178  228       Latest Ref Rng & Units 08/17/2023   11:40 AM 06/29/2023   11:36 AM 06/27/2023    2:52 AM  CMP  Glucose 70 - 99 mg/dL 89  890  878   BUN 8 - 23 mg/dL 25  10  12    Creatinine 0.61 - 1.24 mg/dL 8.77  8.78  9.00   Sodium 135 - 145 mmol/L 137  137  138   Potassium 3.5 - 5.1 mmol/L 4.1  3.5  3.5   Chloride 98 - 111 mmol/L 109  108  110   CO2 22 - 32 mmol/L 21  21  22    Calcium  8.9 - 10.3 mg/dL 8.8  8.1  8.0   Total Protein 6.5 - 8.1 g/dL 7.4  6.5    Total Bilirubin 0.0 - 1.2 mg/dL 0.9  0.6  Alkaline Phos 38 - 126 U/L 83  83    AST 15 - 41 U/L 34  29    ALT 0 - 44 U/L 19  18       RADIOGRAPHIC STUDIES: I have personally reviewed the radiological images as listed and agreed with the findings in the report. IR BONE MARROW BIOPSY & ASPIRATION Result Date: 08/19/2023 CLINICAL DATA:  bone lesion, CLL EXAM: FLUOROSCOPIC GUIDED DEEP ILIAC BONE ASPIRATION AND CORE BIOPSY TECHNIQUE: Patient was placed prone on the fluoro table and limited images through the pelvis were obtained. Appropriate skin entry site was identified. Skin site was marked, prepped with chlorhexidine , draped in usual sterile fashion, and infiltrated locally with 1% lidocaine . Intravenous Fentanyl  75mcg and Versed  1.5mg  were administered by RN during a total moderate (conscious) sedation time of 10 minutes; the patient's level of consciousness and physiological / cardiorespiratory status were monitored continuously by radiology RN under my direct supervision. Under fluoroscopic guidance an 11-gauge Cook trocar bone needle was advanced into the right iliac bone just lateral to the sacroiliac joint. Once needle tip position was confirmed, core and aspiration samples were obtained, submitted to pathology for approval. Patient tolerated procedure well. COMPLICATIONS: COMPLICATIONS none IMPRESSION: 1. Technically successful fluoro guided right iliac bone  core and aspiration biopsy. Electronically Signed   By: JONETTA Faes M.D.   On: 08/19/2023 11:29

## 2023-08-24 NOTE — Assessment & Plan Note (Signed)
 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 Lab Results  Component Value Date   HGB 10.4 (L) 08/19/2023   TIBC 307 08/17/2023   IRONPCTSAT 15 (L) 08/17/2023   FERRITIN 24 08/17/2023   recurrent iron  deficiency anemia.   Recommend Venofer  weekly x 3 Colonoscopy findings were reviewed. No obvious source of bleeding.

## 2023-08-25 ENCOUNTER — Other Ambulatory Visit (HOSPITAL_COMMUNITY): Payer: Self-pay

## 2023-08-25 ENCOUNTER — Other Ambulatory Visit: Payer: Self-pay

## 2023-08-25 NOTE — Progress Notes (Signed)
 Specialty Pharmacy Ongoing Clinical Assessment Note  Paul Kemmerer [name] is a 78 y.o. male who is being followed by the specialty pharmacy service for RxSp Oncology   Patient's specialty medication(s) reviewed today: Zanubrutinib  (Brukinsa )   Missed doses in the last 4 weeks: 10 (medication was on hold per Dr. Babara due to cytopenia)   Patient/Caregiver did not have any additional questions or concerns.   Therapeutic benefit summary: Patient is achieving benefit   Adverse events/side effects summary: Experienced adverse events/side effects (cytopenia, being monitored by Dr. Babara)   Patient's therapy is appropriate to: Continue    Goals Addressed             This Visit's Progress    Slow Disease Progression   On track    Patient is on track. Patient will maintain adherence         Follow up: 3 months  Silvano LOISE Dolly Specialty Pharmacist

## 2023-08-29 ENCOUNTER — Encounter (HOSPITAL_COMMUNITY): Payer: Self-pay | Admitting: Oncology

## 2023-09-01 ENCOUNTER — Inpatient Hospital Stay

## 2023-09-01 ENCOUNTER — Inpatient Hospital Stay: Attending: Oncology

## 2023-09-01 VITALS — BP 146/94 | HR 79 | Temp 97.6°F | Resp 18

## 2023-09-01 DIAGNOSIS — C9110 Chronic lymphocytic leukemia of B-cell type not having achieved remission: Secondary | ICD-10-CM

## 2023-09-01 DIAGNOSIS — D509 Iron deficiency anemia, unspecified: Secondary | ICD-10-CM | POA: Insufficient documentation

## 2023-09-01 LAB — SURGICAL PATHOLOGY

## 2023-09-01 MED ORDER — IRON SUCROSE 20 MG/ML IV SOLN
200.0000 mg | Freq: Once | INTRAVENOUS | Status: AC
  Administered 2023-09-01: 200 mg via INTRAVENOUS
  Filled 2023-09-01: qty 10

## 2023-09-01 NOTE — Progress Notes (Signed)
 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  Past medical history of IDA, HLD, COPD, 9/24 lap right colectomy, DM, CAD, CKD.  Patient receiving venofer  and zanubrutinib .    Met with patient during infusion of venofer .  Patient reports that his appetite is decreased.  Has been working remodeling a house and sweating a lot and feels that contributed to his weight loss.  Also reports taste alterations and just not hungry.  Has not tried oral nutrition supplements before.  Drinks milk.  This am ate sausage and oatmeal and milk.  Last night ate pork chop sandwich and fruit.      Medications: spironolactone , lasix , metformin    Labs: reviewed   Anthropometrics:   Height: 73 inches Weight: 215 lb on 7/30 UBW: 235-240 lb (08/2022) BMI: 28  2% weight loss in the last week, concerning 8% weight loss in the last 2 years   Estimated Energy Needs  Kcals: 2450-2900 Protein: 117-147 g Fluid: > 2450 ml   NUTRITION DIAGNOSIS: Unintentional weight loss related to cancer and related treatment side effects as evidenced by 2% weight loss in the last week, taste alterations, and decreased appetite   INTERVENTION:  Discussed importance of good nutrition and weight maintenance Encouraged foods rich in protein Discussed strategies for taste change. Handout provided Encouraged snacking/small meals while working (handout on snacks given) Encouraged oral nutrition supplements (glucerna, ensure max protein samples given Contact information given   MONITORING, EVALUATION, GOAL: Weight trends, intake   Next Visit: Wednesday, Sept 10 during infusion  Paul Briggs B. Dasie SOLON, CSO, LDN Registered Dietitian [phone]

## 2023-09-03 ENCOUNTER — Other Ambulatory Visit: Payer: Self-pay

## 2023-09-03 DIAGNOSIS — Z856 Personal history of leukemia: Secondary | ICD-10-CM | POA: Insufficient documentation

## 2023-09-03 DIAGNOSIS — L02412 Cutaneous abscess of left axilla: Secondary | ICD-10-CM | POA: Diagnosis not present

## 2023-09-03 DIAGNOSIS — E1122 Type 2 diabetes mellitus with diabetic chronic kidney disease: Secondary | ICD-10-CM | POA: Insufficient documentation

## 2023-09-03 DIAGNOSIS — I129 Hypertensive chronic kidney disease with stage 1 through stage 4 chronic kidney disease, or unspecified chronic kidney disease: Secondary | ICD-10-CM | POA: Diagnosis not present

## 2023-09-03 DIAGNOSIS — N189 Chronic kidney disease, unspecified: Secondary | ICD-10-CM | POA: Diagnosis not present

## 2023-09-03 NOTE — ED Triage Notes (Signed)
 Pt presents via POV c/o abscess in left axilla x2-3 days.

## 2023-09-04 ENCOUNTER — Emergency Department
Admission: EM | Admit: 2023-09-04 | Discharge: 2023-09-04 | Disposition: A | Discharge status: Home / Self Care | Attending: Emergency Medicine | Admitting: Emergency Medicine

## 2023-09-04 DIAGNOSIS — L02412 Cutaneous abscess of left axilla: Secondary | ICD-10-CM

## 2023-09-04 MED ORDER — LIDOCAINE-EPINEPHRINE 1 %-1:100000 IJ SOLN
20.0000 mL | Freq: Once | INTRAMUSCULAR | Status: AC
  Administered 2023-09-04: 20 mL
  Filled 2023-09-04: qty 20
  Filled 2023-09-04: qty 1

## 2023-09-04 MED ORDER — DOXYCYCLINE HYCLATE 50 MG PO CAPS
100.0000 mg | ORAL_CAPSULE | Freq: Two times a day (BID) | ORAL | 0 refills | Status: AC
Start: 2023-09-04 — End: 2023-09-11

## 2023-09-04 MED ORDER — DOXYCYCLINE HYCLATE 100 MG PO TABS
100.0000 mg | ORAL_TABLET | Freq: Once | ORAL | Status: AC
  Administered 2023-09-04: 100 mg via ORAL
  Filled 2023-09-04: qty 1

## 2023-09-04 NOTE — ED Provider Notes (Signed)
 [facility] Provider Note    Event Date/Time   First MD Initiated Contact with Patient 09/04/23 [phone]     (approximate)   History   Abscess   HPI  Paul Cowger [name] is a 78 y.o. male   Past medical history of CLL, chronic pain, CKD, diabetes, hypertension, hyperlipidemia, presents to the emergency department with left axilla abscess.  Growing over the last several days worsening pain, some scant purulent drainage.  No fevers or chills.  No other acute medical complaints    External Medical Documents Reviewed: Prior medical notes including IR notes, neurosurgery note      Physical Exam   Triage Vital Signs: ED Triage Vitals [09/03/23 2355]  Encounter Vitals Group     BP (!) 168/93     Girls Systolic BP Percentile      Girls Diastolic BP Percentile      Boys Systolic BP Percentile      Boys Diastolic BP Percentile      Pulse Rate 68     Resp 18     Temp 98.9 F (37.2 C)     Temp Source Oral     SpO2 99 %     Weight      Height      Head Circumference      Peak Flow      Pain Score 10     Pain Loc      Pain Education      Exclude from Growth Chart     Most recent vital signs: Vitals:   09/03/23 2355  BP: (!) 168/93  Pulse: 68  Resp: 18  Temp: 98.9 F (37.2 C)  SpO2: 99%    General: Awake, no distress.  CV:  Good peripheral perfusion.  Resp:  Normal effort.  Abd:  No distention.  Other:  Indurated lesions 2 the left axilla exquisitely tender to palpation, 1 with an open white area draining scant pus.   ED Results / Procedures / Treatments   Labs (all labs ordered are listed, but only abnormal results are displayed) Labs Reviewed - No data to display   PROCEDURES:  Critical Care performed: No  .Incision and Drainage  Date/Time: 09/04/2023 6:23 AM  Performed by: Cyrena Mylar, MD Authorized by: Cyrena Mylar, MD   Consent:    Consent obtained:  Verbal   Consent given by:  Patient   Risks discussed:   Bleeding, incomplete drainage and infection   Alternatives discussed:  No treatment and delayed treatment Universal protocol:    Procedure explained and questions answered to patient or proxy's satisfaction: yes     Patient identity confirmed:  Verbally with patient Location:    Type:  Abscess   Size:  3   Location:  Upper extremity   Upper extremity location: Left axilla. Pre-procedure details:    Skin preparation:  Povidone-iodine Sedation:    Sedation type:  None Anesthesia:    Anesthesia method:  Local infiltration   Local anesthetic:  Lidocaine  1% WITH epi Procedure type:    Complexity:  Complex Procedure details:    Incision depth:  Dermal   Wound management:  Probed and deloculated   Drainage:  Purulent   Drainage amount:  Moderate   Wound treatment:  Wound left open   Packing materials:  None Post-procedure details:    Procedure completion:  Tolerated    MEDICATIONS ORDERED IN ED: Medications  lidocaine -EPINEPHrine  (XYLOCAINE  W/EPI) 1 %-1:100000 (with pres) injection 20 mL (20  mLs Other Given by Other 09/04/23 0500)  doxycycline  (VIBRA -TABS) tablet 100 mg (100 mg Oral Given 09/04/23 0527)     IMPRESSION / MDM / ASSESSMENT AND PLAN / ED COURSE  I reviewed the triage vital signs and the nursing notes.                                Patient's presentation is most consistent with acute presentation with potential threat to life or bodily function.  Differential diagnosis includes, but is not limited to, abscess, cellulitis, hidradenitis suppurativa, considered but less likely sepsis    MDM:    He has 2 distinct abscesses that were drained and tolerated his procedure note describes above.  Left open.  Started on doxycycline .  Tolerated well.  Doubt sepsis in this well-appearing nontoxic patient with no fever.  Will follow-up with PMD.  Return with any worsening.       FINAL CLINICAL IMPRESSION(S) / ED DIAGNOSES   Final diagnoses:  Abscess of left axilla      Rx / DC Orders   ED Discharge Orders          Ordered    doxycycline  (VIBRAMYCIN ) 50 MG capsule  2 times daily        09/04/23 0440             Note:  This document was prepared using Dragon voice recognition software and may include unintentional dictation errors.    Cyrena Mylar, MD 09/04/23 [phone]

## 2023-09-04 NOTE — Discharge Instructions (Signed)
 Take doxycycline  antibiotic for full 7 days.  Please keep your wound clean by washing at least daily with soap and water. If you see any signs of infection like spreading redness, pus coming from the wound, extreme pain, fevers, chills or any other worsening doctor right away or come back to the emergency department  Thank you for choosing us  for your health care today!  Please see your primary doctor this week for a follow up appointment.   If you have any new, worsening, or unexpected symptoms call your doctor right away or come back to the emergency department for reevaluation.  It was my pleasure to care for you today.   Ginnie EDISON Cyrena, MD

## 2023-09-08 ENCOUNTER — Inpatient Hospital Stay

## 2023-09-08 VITALS — BP 140/70 | HR 59 | Temp 97.8°F | Resp 16

## 2023-09-08 DIAGNOSIS — D509 Iron deficiency anemia, unspecified: Secondary | ICD-10-CM | POA: Diagnosis not present

## 2023-09-08 DIAGNOSIS — C9110 Chronic lymphocytic leukemia of B-cell type not having achieved remission: Secondary | ICD-10-CM

## 2023-09-08 MED ORDER — IRON SUCROSE 20 MG/ML IV SOLN
200.0000 mg | Freq: Once | INTRAVENOUS | Status: AC
  Administered 2023-09-08: 200 mg via INTRAVENOUS
  Filled 2023-09-08: qty 10

## 2023-09-08 NOTE — Patient Instructions (Signed)

## 2023-09-12 ENCOUNTER — Ambulatory Visit: Attending: Nurse Practitioner | Admitting: Nurse Practitioner

## 2023-09-12 ENCOUNTER — Ambulatory Visit (INDEPENDENT_AMBULATORY_CARE_PROVIDER_SITE_OTHER): Admitting: Surgery

## 2023-09-12 ENCOUNTER — Encounter: Payer: Self-pay | Admitting: Surgery

## 2023-09-12 ENCOUNTER — Encounter: Payer: Self-pay | Admitting: Nurse Practitioner

## 2023-09-12 VITALS — BP 149/70 | HR 58 | Temp 97.4°F | Ht 73.0 in | Wt 215.0 lb

## 2023-09-12 VITALS — BP 169/79 | HR 62 | Ht 73.0 in | Wt 214.0 lb

## 2023-09-12 DIAGNOSIS — K432 Incisional hernia without obstruction or gangrene: Secondary | ICD-10-CM

## 2023-09-12 DIAGNOSIS — M48062 Spinal stenosis, lumbar region with neurogenic claudication: Secondary | ICD-10-CM | POA: Diagnosis not present

## 2023-09-12 DIAGNOSIS — G894 Chronic pain syndrome: Secondary | ICD-10-CM | POA: Diagnosis present

## 2023-09-12 DIAGNOSIS — Z79899 Other long term (current) drug therapy: Secondary | ICD-10-CM | POA: Insufficient documentation

## 2023-09-12 DIAGNOSIS — Z79891 Long term (current) use of opiate analgesic: Secondary | ICD-10-CM | POA: Insufficient documentation

## 2023-09-12 DIAGNOSIS — R1084 Generalized abdominal pain: Secondary | ICD-10-CM

## 2023-09-12 DIAGNOSIS — R1031 Right lower quadrant pain: Secondary | ICD-10-CM

## 2023-09-12 DIAGNOSIS — M542 Cervicalgia: Secondary | ICD-10-CM | POA: Diagnosis present

## 2023-09-12 DIAGNOSIS — G8929 Other chronic pain: Secondary | ICD-10-CM | POA: Diagnosis present

## 2023-09-12 DIAGNOSIS — M5412 Radiculopathy, cervical region: Secondary | ICD-10-CM | POA: Diagnosis present

## 2023-09-12 DIAGNOSIS — Z5181 Encounter for therapeutic drug level monitoring: Secondary | ICD-10-CM | POA: Insufficient documentation

## 2023-09-12 DIAGNOSIS — M5416 Radiculopathy, lumbar region: Secondary | ICD-10-CM | POA: Insufficient documentation

## 2023-09-12 MED ORDER — OXYCODONE-ACETAMINOPHEN 10-325 MG PO TABS: 1.0000 | ORAL_TABLET | Freq: Three times a day (TID) | ORAL | 0 refills | Status: AC | PRN

## 2023-09-12 MED ORDER — OXYCODONE-ACETAMINOPHEN 10-325 MG PO TABS: 1.0000 | ORAL_TABLET | Freq: Three times a day (TID) | ORAL | 0 refills | Status: DC | PRN

## 2023-09-12 NOTE — Progress Notes (Signed)
 Safety precautions to be maintained throughout the outpatient stay will include: orient to surroundings, keep bed in low position, maintain call bell within reach at all times, provide assistance with transfer out of bed and ambulation.   Nursing Pain Medication Assessment:  Safety precautions to be maintained throughout the outpatient stay will include: orient to surroundings, keep bed in low position, maintain call bell within reach at all times, provide assistance with transfer out of bed and ambulation.  Medication Inspection Compliance: Pill count conducted under aseptic conditions, in front of the patient. Neither the pills nor the bottle was removed from the patient's sight at any time. Once count was completed pills were immediately returned to the patient in their original bottle.  Medication: Oxycodone /APAP Pill/Patch Count: No pills/patches available to be counted. Pill/Patch Appearance: No markings Bottle Appearance: No container available. Did not bring bottle(s) to appointment. Filled Date: no bottle / no bottle / 2025 Last Medication intake:  Today

## 2023-09-12 NOTE — Patient Instructions (Signed)
 We have ordered for you for a CT Scan of your Abdomen and Pelvis with contrast. Once we get that scheduled we will give you a call with the information.

## 2023-09-12 NOTE — Progress Notes (Signed)
 PROVIDER NOTE: Interpretation of information contained herein should be left to medically-trained personnel. Specific patient instructions are provided elsewhere under Patient Instructions section of medical record. This document was created in part using AI and STT-dictation technology, any transcriptional errors that may result from this process are unintentional.  Patient: Paul Nadara Browner [name]  Service: E/M   PCP: Cyrus Selinda Moose, PA-C  DOB: 22-Oct-1945  DOS: 09/12/2023  Provider: Emmy MARLA Blanch, NP  MRN: [id number]  Delivery: Face-to-face  Specialty: Interventional Pain Management  Type: Established Patient  Setting: Ambulatory outpatient facility  Specialty designation: 09  Referring Prov.: Cyrus Selinda Moose,*  Location: Outpatient office facility       History of present illness (HPI) Mr. Paul Banke [name], a 78 y.o. year old male, is here today because of his Chronic pain syndrome [G89.4]. [name] primary complain today is Back Pain (Mid to lower back and bilateral shoulders)  Pertinent problems: [name] has Chronic back pain; Spinal stenosis, lumbar region, with neurogenic claudication; Chronic left shoulder pain; and Chronic pain syndrome on their pertinent problem list.   Pain Assessment: Severity of Chronic pain is reported as a 4 /10. Location: Back Lower, Mid/denies. Onset: More than a month ago. Quality: Other (Comment), Pressure (it just hurst). Timing: Constant. Modifying factor(s): meds. Vitals:  height is 6' 1 (1.854 m) and weight is 215 lb (97.5 kg). His temperature is 97.4 F (36.3 C) (abnormal). His blood pressure is 149/70 (abnormal) and his pulse is 58 (abnormal). His oxygen  saturation is 100%.  BMI: Estimated body mass index is 28.37 kg/m as calculated from the following:   Height as of this encounter: 6' 1 (1.854 m).   Weight as of this encounter: 215 lb (97.5 kg).  Last encounter: 06/09/2023. Last procedure: Visit date not  found.  Reason for encounter: medication management. No change in medical history since last visit. Patient's pain is at baseline. Patient continues multimodal pain regimen as prescribed. States that it provides pain relief and improvement in functional status.   Pharmacotherapy Assessment   Oxycodone -acetaminophen  (Percocet) 10-325 mg tablet every 8 hours as needed for pain. MME=45 Monitoring: [place] PMP: PDMP reviewed during this encounter.       Pharmacotherapy: No side-effects or adverse reactions reported. Compliance: No problems identified. Effectiveness: Clinically acceptable.  Margrette Nathanel PARAS, RN  09/12/2023  9:59 AM  Sign when Signing Visit Safety precautions to be maintained throughout the outpatient stay will include: orient to surroundings, keep bed in low position, maintain call bell within reach at all times, provide assistance with transfer out of bed and ambulation.   Nursing Pain Medication Assessment:  Safety precautions to be maintained throughout the outpatient stay will include: orient to surroundings, keep bed in low position, maintain call bell within reach at all times, provide assistance with transfer out of bed and ambulation.  Medication Inspection Compliance: Pill count conducted under aseptic conditions, in front of the patient. Neither the pills nor the bottle was removed from the patient's sight at any time. Once count was completed pills were immediately returned to the patient in their original bottle.  Medication: Oxycodone /APAP Pill/Patch Count: No pills/patches available to be counted. Pill/Patch Appearance: No markings Bottle Appearance: No container available. Did not bring bottle(s) to appointment. Filled Date: no bottle / no bottle / 2025 Last Medication intake:  Today    UDS:  Summary  Date Value Ref Range Status  12/21/2022 FINAL  Final    Comment:    ==================================================================== [facility]  13  (MW) ==================================================================== Test                             Result       Flag       Units  Drug Present and Declared for Prescription Verification   Oxycodone                       555          EXPECTED   ng/mg creat   Oxymorphone                    308          EXPECTED   ng/mg creat   Noroxycodone                   1967         EXPECTED   ng/mg creat   Noroxymorphone                 113          EXPECTED   ng/mg creat    Sources of oxycodone  are scheduled prescription medications.    Oxymorphone, noroxycodone, and noroxymorphone are expected    metabolites of oxycodone . Oxymorphone is also available as a    scheduled prescription medication.  ==================================================================== Test                      Result    Flag   Units      Ref Range   Creatinine              119              mg/dL      >=79 ==================================================================== Declared Medications:  The flagging and interpretation on this report are based on the  following declared medications.  Unexpected results may arise from  inaccuracies in the declared medications.   **Note: The testing scope of this panel includes these medications:   Oxycodone  (Percocet)   **Note: The testing scope of this panel does not include the  following reported medications:   Acetaminophen  (Percocet)  Aspirin   Dapagliflozin  (Farxiga )  Doxycycline   Hydralazine  (Apresoline )  Levothyroxine  (Synthroid )  Metformin  (Glucophage )  Methocarbamol  (Robaxin )  Metoprolol  (Toprol )  Ondansetron  (Zofran )  Potassium (Klor-Con )  Pregabalin  (Lyrica )  Rosuvastatin  (Crestor )  Sacubitril  (Entresto )  Sildenafil  (Viagra )  Tamsulosin  (Flomax )  Valsartan  (Entresto )  Vitamin B12  Zanubrutinib  (Brukinsa ) ==================================================================== For clinical consultation, please call [phone]. ====================================================================     No results found for: CBDTHCR No results found for: D8THCCBX No results found for: D9THCCBX  ROS  Constitutional: Denies any fever or chills Gastrointestinal: No reported hemesis, hematochezia, vomiting, or acute GI distress Musculoskeletal: Back Pain (Mid to lower back and bilateral shoulders) Neurological: No reported episodes of acute onset apraxia, aphasia, dysarthria, agnosia, amnesia, paralysis, loss of coordination, or loss of consciousness  Medication Review  acetaminophen , albuterol , aspirin  EC, empagliflozin , fluticasone -salmeterol, furosemide , hydrALAZINE , ipratropium, levothyroxine , metFORMIN , metoprolol  succinate, oxyCODONE -acetaminophen , rosuvastatin , sacubitril -valsartan , spironolactone , and zanubrutinib   History Review  Allergy: [name] has no known allergies. Drug: [name]  reports no history of drug use. Alcohol:  reports no history of alcohol use. Tobacco:  reports that he has quit smoking. His smoking use included cigarettes. He has been exposed to tobacco smoke. He has never used smokeless tobacco. Social: [name]  reports that he has quit smoking.  His smoking use included cigarettes. He has been exposed to tobacco smoke. He has never used smokeless tobacco. He reports that he does not drink alcohol and does not use drugs. 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), DDD (degenerative disc disease), cervical, Difficult airway (10/22/2015), Diverticulosis, DM  (diabetes mellitus), type 2 (HCC), Dysplasia of prostate, Erectile dysfunction, Frequent falls, Heart failure with mildly reduced ejection fraction (HFmrEF) (HCC), 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 Surgical: [name]  has a past surgical history that includes Back surgery; Tonsillectomy; Esophagogastroduodenoscopy (egd) with propofol  (N/A, 08/21/2022); biopsy (08/21/2022); Colonoscopy with propofol  (N/A, 08/23/2022); Givens capsule study (N/A, 08/23/2022); polypectomy (08/23/2022); Submucosal tattoo injection (08/23/2022); biopsy (08/23/2022); Irrigation and debridement abscess (Left, 08/28/2022); Solmon hole of cranium (Left, 10/22/2015); Laparoscopic right colectomy (Right, 09/28/2022); Ventral hernia repair (N/A, 09/28/2022); Colon surgery (09/28/2022); Colonoscopy with propofol  (N/A, 02/28/2023); Esophagogastroduodenoscopy (egd) with propofol  (N/A, 02/28/2023); biopsy (02/28/2023); Inguinal hernia repair (Right, 03/09/2023); Hernia repair; Colonoscopy with propofol  (N/A, 04/25/2023); polypectomy (04/25/2023); IR US  Guide Bx Asp/Drain (06/27/2023); and IR BONE MARROW BIOPSY & ASPIRATION (08/19/2023). Family: family history includes ADD / ADHD in his daughter; Asthma in his son; Cancer in his maternal aunt.  Laboratory Chemistry Profile   Renal Lab Results  Component Value Date   BUN 25 (H) 08/17/2023   CREATININE 1.22 08/17/2023   BCR 11 12/22/2022   GFRAA >60  10/22/2015   GFRNONAA >60 08/17/2023    Hepatic Lab Results  Component Value Date   AST 34 08/17/2023   ALT 19 08/17/2023   ALBUMIN 4.3 08/17/2023   ALKPHOS 83 08/17/2023   HCVAB NON REACTIVE 12/26/2021   LIPASE 35 11/12/2022    Electrolytes Lab Results  Component Value Date   NA 137 08/17/2023   K 4.1 08/17/2023   CL 109 08/17/2023   CALCIUM  8.8 (L) 08/17/2023   MG 2.1 06/26/2023   PHOS 2.7 10/06/2022    Bone Lab Results  Component Value Date   VD25OH 30.27 05/21/2022   TESTOSTERONE  368 11/24/2022    Inflammation (CRP: Acute Phase) (ESR: Chronic Phase) Lab Results  Component Value Date   CRP 11.8 (H) 06/23/2023   ESRSEDRATE 37 (H) 06/23/2023   LATICACIDVEN 1.2 06/23/2023         Note: Above Lab results reviewed.  Recent Imaging Review  IR BONE MARROW BIOPSY & ASPIRATION CLINICAL DATA:  bone lesion, CLL  EXAM: FLUOROSCOPIC GUIDED DEEP ILIAC BONE ASPIRATION AND CORE BIOPSY  TECHNIQUE: Patient was placed prone on the fluoro table and limited images through the pelvis were obtained.  Appropriate skin entry site was identified. Skin site was marked, prepped with chlorhexidine , draped in usual sterile fashion, and infiltrated locally with 1% lidocaine .  Intravenous Fentanyl  75mcg and Versed  1.5mg  were administered by RN during a total moderate (conscious) sedation time of 10 minutes;  the patient's level of consciousness and physiological / cardiorespiratory status were monitored continuously by radiology RN under my direct supervision.  Under fluoroscopic guidance an 11-gauge Cook trocar bone needle was advanced into the right iliac bone just lateral to the sacroiliac joint. Once needle tip position was confirmed, core and aspiration samples were obtained, submitted to pathology for approval. Patient tolerated procedure well.  COMPLICATIONS: COMPLICATIONS none  IMPRESSION: 1. Technically successful fluoro guided right iliac bone core and aspiration  biopsy.  Electronically Signed   By: JONETTA Faes M.D.   On: 08/19/2023 11:29 Note: Reviewed        Physical Exam  Vitals: BP (!) 149/70   Pulse (!) 58   Temp (!) 97.4 F (36.3 C)   Ht 6' 1 (1.854 m)   Wt 215 lb (97.5 kg)   SpO2 100%   BMI 28.37 kg/m  BMI: Estimated body mass index is 28.37 kg/m as calculated from the following:   Height as of this encounter: 6' 1 (1.854 m).   Weight as of this encounter: 215 lb (97.5 kg). Ideal: Ideal body weight: 79.9 kg (176 lb 2.4 oz) Adjusted ideal body weight: 86.9 kg (191 lb 11 oz) General appearance: Well nourished, well developed, and well hydrated. In no apparent acute distress Mental status: Alert, oriented x 3 (person, place, & time)       Respiratory: No evidence of acute respiratory distress Eyes: PERLA   Assessment   Diagnosis Status  1. Chronic pain syndrome   2. Cervical radicular pain   3. Cervicalgia   4. Lumbar radicular pain   5. Spinal stenosis, lumbar region, with neurogenic claudication   6. Chronic radicular lumbar pain   7. Pharmacologic therapy   8. Chronic use of opiate for therapeutic purpose   9. Encounter for medication management    Controlled Controlled Controlled   Updated Problems: No problems updated.  Plan of Care  Problem-specific:  Assessment and Plan  We will continue on current medication regimen.  Prescribing drug monitoring (PMP) reviewed; findings consistent with prescribed medication use, no evidence of narcotic misuse or abuse.  Urine drug screening (UDS) up to date.  Schedule follow-up visit in 90 days   Mr. Paul Nadara Browner [name] has a current medication list which includes the following long-term medication(s): fluticasone -salmeterol, hydralazine , ipratropium, levothyroxine , metformin , metoprolol  succinate, rosuvastatin , and spironolactone .  Pharmacotherapy (Medications Ordered): Meds ordered this encounter  Medications   oxyCODONE -acetaminophen  (PERCOCET) 10-325 MG tablet     Sig: Take 1 tablet by mouth every 8 (eight) hours as needed for pain. Must last 30 days.    Dispense:  90 tablet    Refill:  0    Chronic Pain: STOP Act (Not applicable) Fill 1 day early if closed on refill date. Avoid benzodiazepines within 8 hours of opioids   oxyCODONE -acetaminophen  (PERCOCET) 10-325 MG tablet    Sig: Take 1 tablet by mouth every 8 (eight) hours as needed for pain. Must last 30 days.    Dispense:  90 tablet    Refill:  0    Chronic Pain: STOP Act (Not applicable) Fill 1 day early if closed on refill date. Avoid benzodiazepines within 8 hours of opioids   oxyCODONE -acetaminophen  (PERCOCET) 10-325 MG tablet    Sig: Take 1 tablet by mouth every 8 (eight) hours as needed for pain. Must last 30 days.    Dispense:  90 tablet    Refill:  0    Chronic Pain: STOP Act (Not applicable) Fill 1  day early if closed on refill date. Avoid benzodiazepines within 8 hours of opioids   Orders:  No orders of the defined types were placed in this encounter.       Return in about 3 months (around 12/13/2023) for (F2F), (MM), Emmy Blanch NP.    Recent Visits No visits were found meeting these conditions. Showing recent visits within past 90 days and meeting all other requirements Today's Visits Date Type Provider Dept  09/12/23 Office Visit Teasha Murrillo K, NP [facility]  Showing today's visits and meeting all other requirements Future Appointments Date Type Provider Dept  12/05/23 Appointment Dorwin Fitzhenry K, NP [facility]  Showing future appointments within next 90 days and meeting all other requirements  I discussed the assessment and treatment plan with the patient. The patient was provided an opportunity to ask questions and all were answered. The patient agreed with the plan and demonstrated an understanding of the instructions.  Patient advised to call back or seek an in-person evaluation if the symptoms or condition worsens.  Duration of encounter:  30 minutes.  Total time on encounter, as per AMA guidelines included both the face-to-face and non-face-to-face time personally spent by the physician and/or other qualified health care professional(s) on the day of the encounter (includes time in activities that require the physician or other qualified health care professional and does not include time in activities normally performed by clinical staff). Physician's time may include the following activities when performed: Preparing to see the patient (e.g., pre-charting review of records, searching for previously ordered imaging, lab work, and nerve conduction tests) Review of prior analgesic pharmacotherapies. Reviewing PMP Interpreting ordered tests (e.g., lab work, imaging, nerve conduction tests) Performing post-procedure evaluations, including interpretation of diagnostic procedures Obtaining and/or reviewing separately obtained history Performing a medically appropriate examination and/or evaluation Counseling and educating the patient/family/caregiver Ordering medications, tests, or procedures Referring and communicating with other health care professionals (when not separately reported) Documenting clinical information in the electronic or other health record Independently interpreting results (not separately reported) and communicating results to the patient/ family/caregiver Care coordination (not separately reported)  Note by: Aleria Maheu K Abrian Hanover, NP (TTS and AI technology used. I apologize for any typographical errors that were not detected and corrected.) Date: 09/12/2023; Time: 11:26 AM

## 2023-09-12 NOTE — Progress Notes (Signed)
 Outpatient Surgical Follow Up  09/12/2023  Paul Nadara Browner [name] is an 78 y.o. male.   Chief Complaint  Patient presents with   Follow-up    HPI: Mahamed is a 78 year old male well-known to me with prior history of a left inguinal abscess that required debridement followed by laparoscopic right colectomy and right inguinal hernia repair.  Hernia repair was February 2025.  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 A Month or 2 ago he was working when some of the machinery hit his right groin.  He experiences some intermittent pain in the right groin as well as some numbness.  He describes the pain as an ache mild to moderate intensity no specific alleviating or aggravating factors.  No fevers no chills.  He is tolerating diet For months ago had a history of head trauma and underwent a CT of the head and C-spine that I personally reviewed showing no acute intracranial abnormalities  Past Medical History:  Diagnosis Date   Adenoma of left adrenal gland    Anemia    Aortic atherosclerosis (HCC)    Blood transfusion without reported diagnosis    BPH (benign prostatic hyperplasia)    Bradycardia    CAD (coronary artery disease) 08/26/2022   a.) cCTA 08/26/2022: Ca2+ 14.3 (12th %'ile for age/sex.race match control); (<25%) pLAD   Cellulitis of left thigh 08/2022   Chronic pain syndrome    a.) on COT managed by pain management   Chronic, continuous use of opioids    a.) chronic pain syndrome/chronic back pain; managed by pain management   CKD (chronic kidney disease) stage 3, GFR 30-59 ml/min (HCC)    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   a.) Rai stage III   DDD (degenerative disc disease), cervical    Difficult airway 10/22/2015   a.) 1st attempt with glidescope  --> macroglossic (unsuccessful); b.) 2nd attempt with Mac 4 and direct laryngoscopy with a 8.0 ETT --> tube too large to pass through the cords; b.) 3rd attempt (successful) with a Mac 4 and a 7.5  tube   Diverticulosis    DM (diabetes mellitus), type 2 (HCC)    Dysplasia of prostate    Erectile dysfunction    Frequent falls    Heart failure with mildly reduced ejection fraction (HFmrEF) (HCC)    a.) TTE 10/23/2015: EF 60-65%, mod-sev LVH, mild biatrial dil, degen MV disease, AoV sclerosis, asc Ao 38 mm, G1DD; b.) TTE 08/05/2022: EF 45-50%, mod-sev LVH (speckled pattern), sev biatrial dil, mild MR, AoV sclerosis, G1DD   Hepatic flexure mass 08/23/2022   a.) colonoscopy 08/23/2022: 14 x 15 mm partially obstructing ulcerated distal ascending colon mass; tissue friable --> pathology resulted as tubular adenoma, however felt to be false (-) --> referral to sugery and colectomy recommended; b.) s/p RIGHT hemicolectomy 09/28/2022   History of MRSA infection 08/28/2022   a.) MRSA PCR (+) 08/28/2022; culture from LEFT thigh abscess   HTN (hypertension)    Hyperlipidemia    Hypogonadism in male    Hypothyroidism    IDA (iron  deficiency anemia)    LBBB (left bundle branch block)    Long-term use of aspirin  therapy    Lumbar spinal stenosis    Mild cardiomegaly    Multiple lung nodules on CT    NICM (nonischemic cardiomyopathy) (HCC)    a.) TTE 10/23/2015: EF 60-65; b.) TTE 08/05/2022: EF 45-50%   Orchitis and epididymitis 06/16/2013   OSA on CPAP    Palindromic rheumatism,  hand    Pericardial effusion    Prostatitis    Sepsis (HCC) 08/2022   Subdural hematoma (HCC) 10/22/2015   a.) s/p traumatic mechanical fall --> CT head 10/22/2015: high-density SDH the left cerebral convexity with13 mm midline shift --> s/p LEFT frontal burr hole craniotomy   Tubular adenoma of colon    Ventral hernia     Past Surgical History:  Procedure Laterality Date   BACK SURGERY     BIOPSY  08/21/2022   Procedure: BIOPSY;  Surgeon: Onita Elspeth Sharper, DO;  Location: [facility] ENDOSCOPY;  Service: Gastroenterology;;   BIOPSY  08/23/2022   Procedure: BIOPSY;  Surgeon: Onita Elspeth Sharper, DO;  Location:  [facility] ENDOSCOPY;  Service: Gastroenterology;;   BIOPSY  02/28/2023   Procedure: BIOPSY;  Surgeon: Onita Elspeth Sharper, DO;  Location: [facility] ENDOSCOPY;  Service: Gastroenterology;;   SOLMON DAKIN OF CRANIUM Left 10/22/2015   COLON SURGERY  09/28/2022   COLONOSCOPY WITH PROPOFOL  N/A 08/23/2022   Procedure: COLONOSCOPY WITH PROPOFOL ;  Surgeon: Onita Elspeth Sharper, DO;  Location: [facility] ENDOSCOPY;  Service: Gastroenterology;  Laterality: N/A;   COLONOSCOPY WITH PROPOFOL  N/A 02/28/2023   Procedure: COLONOSCOPY WITH PROPOFOL ;  Surgeon: Onita Elspeth Sharper, DO;  Location: [facility] ENDOSCOPY;  Service: Gastroenterology;  Laterality: N/A;  DM   COLONOSCOPY WITH PROPOFOL  N/A 04/25/2023   Procedure: COLONOSCOPY WITH PROPOFOL ;  Surgeon: Onita Elspeth Sharper, DO;  Location: [facility] ENDOSCOPY;  Service: Gastroenterology;  Laterality: N/A;  DM   ESOPHAGOGASTRODUODENOSCOPY (EGD) WITH PROPOFOL  N/A 08/21/2022   Procedure: ESOPHAGOGASTRODUODENOSCOPY (EGD) WITH PROPOFOL ;  Surgeon: Onita Elspeth Sharper, DO;  Location: [facility] ENDOSCOPY;  Service: Gastroenterology;  Laterality: N/A;   ESOPHAGOGASTRODUODENOSCOPY (EGD) WITH PROPOFOL  N/A 02/28/2023   Procedure: ESOPHAGOGASTRODUODENOSCOPY (EGD) WITH PROPOFOL ;  Surgeon: Onita Elspeth Sharper, DO;  Location: [facility] ENDOSCOPY;  Service: Gastroenterology;  Laterality: N/A;   GIVENS CAPSULE STUDY N/A 08/23/2022   Procedure: GIVENS CAPSULE STUDY;  Surgeon: Onita Elspeth Sharper, DO;  Location: [facility] ENDOSCOPY;  Service: Gastroenterology;  Laterality: N/A;   HERNIA REPAIR     INGUINAL HERNIA REPAIR Right 03/09/2023   Procedure: HERNIA REPAIR INGUINAL ADULT, open, RNFA to assist;  Surgeon: Jordis Laneta FALCON, MD;  Location: [facility] ORS;  Service: General;  Laterality: Right;   IR BONE MARROW BIOPSY & ASPIRATION  08/19/2023   IR US  GUIDE BX ASP/DRAIN  06/27/2023   IRRIGATION AND DEBRIDEMENT ABSCESS Left 08/28/2022   Procedure: IRRIGATION AND DEBRIDEMENT ABSCESS LEFT UPPER THIGH/GROIN;  Surgeon: Jordis Laneta FALCON, MD;  Location: [facility] ORS;  Service: General;  Laterality: Left;   LAPAROSCOPIC RIGHT COLECTOMY Right 09/28/2022   Procedure: LAPAROSCOPIC RIGHT COLECTOMY, RNFA to assist;  Surgeon: Jordis Laneta FALCON, MD;  Location: [facility] ORS;  Service: General;  Laterality: Right;   POLYPECTOMY  08/23/2022   Procedure: POLYPECTOMY;  Surgeon: Onita Elspeth Sharper, DO;  Location: [facility] ENDOSCOPY;  Service: Gastroenterology;;   POLYPECTOMY  04/25/2023   Procedure: POLYPECTOMY;  Surgeon: Onita Elspeth Sharper, DO;  Location: [facility] ENDOSCOPY;  Service: Gastroenterology;;   SUBMUCOSAL TATTOO INJECTION  08/23/2022   Procedure: SUBMUCOSAL TATTOO INJECTION;  Surgeon: Onita Elspeth Sharper, DO;  Location: [facility] ENDOSCOPY;  Service: Gastroenterology;;   TONSILLECTOMY     VENTRAL HERNIA REPAIR N/A 09/28/2022   Procedure: HERNIA REPAIR VENTRAL ADULT;  Surgeon: Jordis Laneta FALCON, MD;  Location: [facility] ORS;  Service: General;  Laterality: N/A;    Family History  Problem Relation Age of Onset   Cancer Maternal Aunt    ADD / ADHD Daughter    Asthma Son  Prostate cancer Neg Hx    Kidney disease Neg Hx    Kidney cancer Neg Hx    Bladder Cancer Neg Hx     Social History:  reports that he has quit smoking. His smoking use included cigarettes. He has been exposed to tobacco smoke. He has never used smokeless tobacco. He reports that he does not drink alcohol and does not use drugs.  Allergies: No Known Allergies  Medications reviewed.    ROS Full ROS performed and is otherwise negative other than what is stated in HPI   BP (!) 169/79   Pulse 62   Ht 6' 1 (1.854 m)   Wt 214 lb (97.1 kg)   SpO2 96%   BMI 28.23 kg/m   Physical Exam Vitals and nursing note reviewed. Exam conducted with a chaperone present.  Constitutional:      General: He is not in acute distress.    Appearance: Normal appearance. He is normal weight. He is not ill-appearing.  Pulmonary:     Effort: Pulmonary effort is normal.     Breath  sounds: No stridor.  Abdominal:     General: Abdomen is flat. There is no distension.     Palpations: There is no mass.     Hernia: A hernia is present.     Comments: Evidence of midline ventral hernia that is reducible.  There is no evidence of a right inguinal hernia.  There is mild tenderness to palpation in the right inguinal region.  Musculoskeletal:        General: No swelling or tenderness. Normal range of motion.  Skin:    General: Skin is warm and dry.     Capillary Refill: Capillary refill takes less than 2 seconds.  Neurological:     General: No focal deficit present.     Mental Status: He is alert and oriented to person, place, and time.  Psychiatric:        Mood and Affect: Mood normal.        Behavior: Behavior normal.        Thought Content: Thought content normal.        Judgment: Judgment normal.      Assessment/Plan: Ventral hernia recurrence at this point he is not symptomatic.  He does have also chronic unsure if trauma create a potential hematoma.  Discussed with him in detail and I do think that we need to obtain an images with a CT scan of the abdomen pelvis to evaluate for the ventral hernia and the right inguinal area.  Also discussed with him that numbness might be a sequela.  He understands. I personally spent a total of 30 minutes in the care of the patient today including performing a medically appropriate exam/evaluation, counseling and educating, placing orders, referring and communicating with other health care professionals, documenting clinical information in the EHR, independently interpreting and reviewing images studies and coordinating care.   Laneta Luna, MD [facility] General Surgeon

## 2023-09-14 ENCOUNTER — Telehealth: Payer: Self-pay | Admitting: *Deleted

## 2023-09-14 ENCOUNTER — Other Ambulatory Visit: Payer: Self-pay

## 2023-09-14 NOTE — Telephone Encounter (Signed)
 Left message for patient letting him know his CT appointment details: 09/15/23 [facility]

## 2023-09-15 ENCOUNTER — Ambulatory Visit
Admission: RE | Admit: 2023-09-15 | Discharge: 2023-09-15 | Disposition: A | Discharge status: Home / Self Care | Source: Ambulatory Visit | Attending: Surgery | Admitting: Surgery

## 2023-09-15 DIAGNOSIS — K439 Ventral hernia without obstruction or gangrene: Secondary | ICD-10-CM | POA: Diagnosis not present

## 2023-09-15 DIAGNOSIS — K5730 Diverticulosis of large intestine without perforation or abscess without bleeding: Secondary | ICD-10-CM | POA: Diagnosis not present

## 2023-09-15 DIAGNOSIS — D7389 Other diseases of spleen: Secondary | ICD-10-CM | POA: Diagnosis not present

## 2023-09-15 DIAGNOSIS — R1084 Generalized abdominal pain: Secondary | ICD-10-CM | POA: Diagnosis not present

## 2023-09-15 MED ORDER — IOHEXOL 300 MG/ML  SOLN
100.0000 mL | Freq: Once | INTRAMUSCULAR | Status: AC | PRN
  Administered 2023-09-15: 100 mL via INTRAVENOUS

## 2023-09-19 ENCOUNTER — Other Ambulatory Visit: Payer: Self-pay

## 2023-09-19 ENCOUNTER — Telehealth: Payer: Self-pay | Admitting: Surgery

## 2023-09-19 DIAGNOSIS — M5442 Lumbago with sciatica, left side: Secondary | ICD-10-CM | POA: Diagnosis not present

## 2023-09-19 DIAGNOSIS — M19011 Primary osteoarthritis, right shoulder: Secondary | ICD-10-CM | POA: Diagnosis not present

## 2023-09-19 DIAGNOSIS — G8929 Other chronic pain: Secondary | ICD-10-CM | POA: Diagnosis not present

## 2023-09-19 DIAGNOSIS — M48062 Spinal stenosis, lumbar region with neurogenic claudication: Secondary | ICD-10-CM | POA: Diagnosis not present

## 2023-09-19 DIAGNOSIS — Z5941 Food insecurity: Secondary | ICD-10-CM | POA: Diagnosis not present

## 2023-09-19 DIAGNOSIS — M12811 Other specific arthropathies, not elsewhere classified, right shoulder: Secondary | ICD-10-CM | POA: Diagnosis not present

## 2023-09-19 DIAGNOSIS — M25511 Pain in right shoulder: Secondary | ICD-10-CM | POA: Diagnosis not present

## 2023-09-19 DIAGNOSIS — M75102 Unspecified rotator cuff tear or rupture of left shoulder, not specified as traumatic: Secondary | ICD-10-CM | POA: Diagnosis not present

## 2023-09-19 DIAGNOSIS — M75101 Unspecified rotator cuff tear or rupture of right shoulder, not specified as traumatic: Secondary | ICD-10-CM | POA: Diagnosis not present

## 2023-09-19 DIAGNOSIS — M5441 Lumbago with sciatica, right side: Secondary | ICD-10-CM | POA: Diagnosis not present

## 2023-09-19 DIAGNOSIS — E119 Type 2 diabetes mellitus without complications: Secondary | ICD-10-CM | POA: Diagnosis not present

## 2023-09-19 DIAGNOSIS — M12812 Other specific arthropathies, not elsewhere classified, left shoulder: Secondary | ICD-10-CM | POA: Diagnosis not present

## 2023-09-19 NOTE — Telephone Encounter (Signed)
 Pt said that he is returning a call from the office.  I only seen a call form the office on 09-06-2023 and says a message was left for the patient.Please give pt a call back at [phone]

## 2023-09-21 ENCOUNTER — Other Ambulatory Visit (HOSPITAL_COMMUNITY): Payer: Self-pay

## 2023-09-23 ENCOUNTER — Telehealth: Payer: Self-pay | Admitting: *Deleted

## 2023-09-23 NOTE — Telephone Encounter (Signed)
 Patient called and said that he is running out of his medicine and so they needs to take care of.  Called back to that number and got a voicemail and I asked him to call me back about what medicine that he is referring to so that we could  to try be able to help.  I left my telephone for and to call back

## 2023-09-27 ENCOUNTER — Other Ambulatory Visit: Payer: Self-pay

## 2023-09-27 ENCOUNTER — Other Ambulatory Visit (HOSPITAL_COMMUNITY): Payer: Self-pay

## 2023-09-27 NOTE — Progress Notes (Signed)
 Specialty Pharmacy Refill Coordination Note  Brynn Reznik. is a 78 y.o. male contacted today regarding refills of specialty medication(s) Zanubrutinib  (Brukinsa )   Patient requested Delivery   Delivery date: 09/28/23   Verified address: [address], [place] [address]   Medication will be filled on 09/27/23.

## 2023-09-30 ENCOUNTER — Other Ambulatory Visit: Payer: Self-pay

## 2023-09-30 DIAGNOSIS — C9110 Chronic lymphocytic leukemia of B-cell type not having achieved remission: Secondary | ICD-10-CM

## 2023-10-01 ENCOUNTER — Emergency Department

## 2023-10-01 ENCOUNTER — Other Ambulatory Visit: Payer: Self-pay

## 2023-10-01 ENCOUNTER — Emergency Department
Admission: EM | Admit: 2023-10-01 | Discharge: 2023-10-01 | Disposition: A | Discharge status: Home / Self Care | Attending: Emergency Medicine | Admitting: Emergency Medicine

## 2023-10-01 DIAGNOSIS — D631 Anemia in chronic kidney disease: Secondary | ICD-10-CM | POA: Diagnosis not present

## 2023-10-01 DIAGNOSIS — R7989 Other specified abnormal findings of blood chemistry: Secondary | ICD-10-CM | POA: Diagnosis not present

## 2023-10-01 DIAGNOSIS — N189 Chronic kidney disease, unspecified: Secondary | ICD-10-CM | POA: Diagnosis not present

## 2023-10-01 DIAGNOSIS — R600 Localized edema: Secondary | ICD-10-CM | POA: Diagnosis not present

## 2023-10-01 DIAGNOSIS — L03115 Cellulitis of right lower limb: Secondary | ICD-10-CM | POA: Diagnosis not present

## 2023-10-01 DIAGNOSIS — L03113 Cellulitis of right upper limb: Secondary | ICD-10-CM | POA: Diagnosis not present

## 2023-10-01 DIAGNOSIS — M79661 Pain in right lower leg: Secondary | ICD-10-CM | POA: Diagnosis not present

## 2023-10-01 DIAGNOSIS — E1122 Type 2 diabetes mellitus with diabetic chronic kidney disease: Secondary | ICD-10-CM | POA: Diagnosis not present

## 2023-10-01 DIAGNOSIS — M7989 Other specified soft tissue disorders: Secondary | ICD-10-CM | POA: Diagnosis not present

## 2023-10-01 DIAGNOSIS — I509 Heart failure, unspecified: Secondary | ICD-10-CM | POA: Diagnosis not present

## 2023-10-01 DIAGNOSIS — M79604 Pain in right leg: Secondary | ICD-10-CM | POA: Diagnosis present

## 2023-10-01 DIAGNOSIS — I517 Cardiomegaly: Secondary | ICD-10-CM | POA: Diagnosis not present

## 2023-10-01 DIAGNOSIS — D72829 Elevated white blood cell count, unspecified: Secondary | ICD-10-CM | POA: Insufficient documentation

## 2023-10-01 LAB — CBC WITH DIFFERENTIAL/PLATELET
Abs Immature Granulocytes: 0.14 K/uL — ABNORMAL HIGH (ref 0.00–0.07)
Basophils Absolute: 0.1 K/uL (ref 0.0–0.1)
Basophils Relative: 1 %
Eosinophils Absolute: 0.3 K/uL (ref 0.0–0.5)
Eosinophils Relative: 2 %
HCT: 29.6 % — ABNORMAL LOW (ref 39.0–52.0)
Hemoglobin: 9.1 g/dL — ABNORMAL LOW (ref 13.0–17.0)
Immature Granulocytes: 1 %
Lymphocytes Relative: 61 %
Lymphs Abs: 7.5 K/uL — ABNORMAL HIGH (ref 0.7–4.0)
MCH: 29.1 pg (ref 26.0–34.0)
MCHC: 30.7 g/dL (ref 30.0–36.0)
MCV: 94.6 fL (ref 80.0–100.0)
Monocytes Absolute: 0.9 K/uL (ref 0.1–1.0)
Monocytes Relative: 7 %
Neutro Abs: 3.5 K/uL (ref 1.7–7.7)
Neutrophils Relative %: 28 %
Platelets: 164 K/uL (ref 150–400)
RBC: 3.13 MIL/uL — ABNORMAL LOW (ref 4.22–5.81)
RDW: 17.8 % — ABNORMAL HIGH (ref 11.5–15.5)
Smear Review: NORMAL
WBC Morphology: REACTIVE
WBC: 12.3 K/uL — ABNORMAL HIGH (ref 4.0–10.5)
nRBC: 0 % (ref 0.0–0.2)

## 2023-10-01 LAB — COMPREHENSIVE METABOLIC PANEL WITH GFR
ALT: 20 U/L (ref 0–44)
AST: 33 U/L (ref 15–41)
Albumin: 3.5 g/dL (ref 3.5–5.0)
Alkaline Phosphatase: 66 U/L (ref 38–126)
Anion gap: 7 (ref 5–15)
BUN: 17 mg/dL (ref 8–23)
CO2: 26 mmol/L (ref 22–32)
Calcium: 9 mg/dL (ref 8.9–10.3)
Chloride: 108 mmol/L (ref 98–111)
Creatinine, Ser: 1.25 mg/dL — ABNORMAL HIGH (ref 0.61–1.24)
GFR, Estimated: 59 mL/min — ABNORMAL LOW (ref >60)
Glucose, Bld: 106 mg/dL — ABNORMAL HIGH (ref 70–99)
Potassium: 3.9 mmol/L (ref 3.5–5.1)
Sodium: 141 mmol/L (ref 135–145)
Total Bilirubin: 0.7 mg/dL (ref 0.0–1.2)
Total Protein: 6.8 g/dL (ref 6.5–8.1)

## 2023-10-01 LAB — URINALYSIS, W/ REFLEX TO CULTURE (INFECTION SUSPECTED)
Bacteria, UA: NONE SEEN
Bilirubin Urine: NEGATIVE
Glucose, UA: >=500 mg/dL — AB
Ketones, ur: NEGATIVE mg/dL
Leukocytes,Ua: NEGATIVE
Nitrite: NEGATIVE
Protein, ur: 30 mg/dL — AB
Specific Gravity, Urine: 1.024 (ref 1.005–1.030)
pH: 5 (ref 5.0–8.0)

## 2023-10-01 LAB — LACTIC ACID, PLASMA: Lactic Acid, Venous: 1 mmol/L (ref 0.5–1.9)

## 2023-10-01 MED ORDER — OXYCODONE-ACETAMINOPHEN 5-325 MG PO TABS: 2.0000 | ORAL_TABLET | Freq: Once | ORAL | Status: DC

## 2023-10-01 MED ORDER — ACETAMINOPHEN 325 MG PO TABS: 650.0000 mg | ORAL_TABLET | Freq: Once | ORAL | Status: DC

## 2023-10-01 MED ORDER — OXYCODONE-ACETAMINOPHEN 5-325 MG PO TABS: 1.0000 | ORAL_TABLET | Freq: Once | ORAL | Status: DC

## 2023-10-01 MED ORDER — OXYCODONE-ACETAMINOPHEN 5-325 MG PO TABS
2.0000 | ORAL_TABLET | Freq: Once | ORAL | Status: AC
  Administered 2023-10-01: 2 via ORAL
  Filled 2023-10-01: qty 2

## 2023-10-01 MED ORDER — DOXYCYCLINE HYCLATE 100 MG PO CAPS: 100.0000 mg | ORAL_CAPSULE | Freq: Two times a day (BID) | ORAL | 0 refills | Status: AC

## 2023-10-01 MED ORDER — CEFADROXIL 500 MG PO CAPS: 500.0000 mg | ORAL_CAPSULE | Freq: Two times a day (BID) | ORAL | 0 refills | Status: AC

## 2023-10-01 MED ORDER — SODIUM CHLORIDE 0.9 % IV SOLN
1.0000 g | INTRAVENOUS | Status: AC
  Administered 2023-10-01: 1 g via INTRAVENOUS
  Filled 2023-10-01: qty 10

## 2023-10-01 MED ORDER — DOXYCYCLINE HYCLATE 100 MG PO TABS
100.0000 mg | ORAL_TABLET | Freq: Once | ORAL | Status: AC
  Administered 2023-10-01: 100 mg via ORAL
  Filled 2023-10-01: qty 1

## 2023-10-01 MED ORDER — ACETAMINOPHEN 325 MG PO TABS
325.0000 mg | ORAL_TABLET | Freq: Once | ORAL | Status: AC
  Administered 2023-10-01: 325 mg via ORAL
  Filled 2023-10-01: qty 1

## 2023-10-01 NOTE — ED Triage Notes (Signed)
 Pt arrives with c/o right lower leg swelling and pain that started 2 days ago. Pt has redness, swelling, and pain to right lower leg. Pt reports pain when ambulating.

## 2023-10-01 NOTE — ED Provider Notes (Signed)
 [facility] Provider Note    Event Date/Time   First MD Initiated Contact with Patient 10/01/23 0430     (approximate)   History   Leg Pain and Leg Swelling   HPI Paul Calvillo [name] is a 78 y.o. male with extensive chronic pain history as well as extensive medical history in general including but not limited to chronic inflammatory demyelinating polyneuropathy, chronic kidney disease, CHF, diabetes, etc.  He presents for evaluation of pain and swelling in his right lower extremity.  This has been going on 2 to 3 days.  He said that he thinks it might of started as a scratch on his right shin and then it has gotten swollen and painful.  He said it hurts to walk on it.  He has no prior history of blood clots in his legs or his lungs.  The pain does not extend past his knee.  No trauma of which he is aware.  He also has a spot on his side that is painful and swollen.  No fever, chest pain, shortness of breath, nausea, vomiting.     Physical Exam   Triage Vital Signs: ED Triage Vitals  Encounter Vitals Group     BP 10/01/23 0127 (!) 171/85     Girls Systolic BP Percentile --      Girls Diastolic BP Percentile --      Boys Systolic BP Percentile --      Boys Diastolic BP Percentile --      Pulse Rate 10/01/23 0127 70     Resp 10/01/23 0127 15     Temp 10/01/23 0127 99.2 F (37.3 C)     Temp Source 10/01/23 0127 Oral     SpO2 10/01/23 0127 98 %     Weight 10/01/23 0126 97.5 kg (215 lb)     Height 10/01/23 0126 1.854 m (6' 1)     Head Circumference --      Peak Flow --      Pain Score 10/01/23 0126 10     Pain Loc --      Pain Education --      Exclude from Growth Chart --     Most recent vital signs: Vitals:   10/01/23 0530 10/01/23 0600  BP: (!) 156/79 (!) 161/92  Pulse: 63 64  Resp:    Temp:    SpO2: 100% 100%    General: Sleeping initially, awakens easily. CV:  Good peripheral perfusion.   Resp:  Normal effort. Speaking easily and  comfortably, no accessory muscle usage nor intercostal retractions.   Abd:  No distention.  Other:  Patient has erythema and pitting peripheral edema from several centimeters below the right knee down to the ankle.  It is tender to palpation.  No wounds or weeping.  No evidence of abscess.  Right leg is visibly larger than left.  Normal range of motion in knee and ankle.   ED Results / Procedures / Treatments   Labs (all labs ordered are listed, but only abnormal results are displayed) Labs Reviewed  COMPREHENSIVE METABOLIC PANEL WITH GFR - Abnormal; Notable for the following components:      Result Value   Glucose, Bld 106 (*)    Creatinine, Ser 1.25 (*)    GFR, Estimated 59 (*)    All other components within normal limits  CBC WITH DIFFERENTIAL/PLATELET - Abnormal; Notable for the following components:   WBC 12.3 (*)    RBC 3.13 (*)  Hemoglobin 9.1 (*)    HCT 29.6 (*)    RDW 17.8 (*)    Lymphs Abs 7.5 (*)    Abs Immature Granulocytes 0.14 (*)    All other components within normal limits  URINALYSIS, W/ REFLEX TO CULTURE (INFECTION SUSPECTED) - Abnormal; Notable for the following components:   Color, Urine YELLOW (*)    APPearance CLEAR (*)    Glucose, UA >=500 (*)    Hgb urine dipstick SMALL (*)    Protein, ur 30 (*)    All other components within normal limits  CULTURE, BLOOD (ROUTINE X 2)  CULTURE, BLOOD (ROUTINE X 2)  LACTIC ACID, PLASMA  PATHOLOGIST SMEAR REVIEW     RADIOLOGY I independently viewed and interpreted the patient's two-view chest x-ray and I see no evidence of pneumonia or pulmonary edema.  I also read the radiologist's report, which confirmed no acute findings.  See ED course for additional details   PROCEDURES:  Critical Care performed: No  Procedures    IMPRESSION / MDM / ASSESSMENT AND PLAN / ED COURSE  I reviewed the triage vital signs and the nursing notes.                              Differential diagnosis includes, but is not  limited to, DVT, cellulitis, volume overload/asymmetric peripheral edema.  Patient's presentation is most consistent with acute presentation with potential threat to life or bodily function.  Labs/studies ordered: Right lower extremity ultrasound, two-view chest x-ray, CBC with differential, blood cultures x 2, lactic acid, urinalysis, CMP  Interventions/Medications given:  Medications  cefTRIAXone  (ROCEPHIN ) 1 g in sodium chloride  0.9 % 100 mL IVPB (has no administration in time range)  doxycycline  (VIBRA -TABS) tablet 100 mg (has no administration in time range)  acetaminophen  (TYLENOL ) tablet 325 mg (325 mg Oral Given 10/01/23 0612)  oxyCODONE -acetaminophen  (PERCOCET/ROXICET) 5-325 MG per tablet 2 tablet (2 tablets Oral Given 10/01/23 0612)    (Note:  hospital course my include additional interventions and/or labs/studies not listed above.)   Patient's vitals are stable other than hypertension, lab work is generally reassuring including a stable anemia, minimal leukocytosis, and stable elevated creatinine.  No evidence of UTI.  Blood cultures are pending and lactic acid is normal.  I will get a right lower extremity ultrasound to rule out DVT but I anticipate empiric treatment for cellulitis.  Patient has no systemic signs of infection other than the minimal leukocytosis.  Patient agrees with the current plan.  I checked the [place]  controlled substance database and verified that he gets monthly prescriptions filled for Percocet 10 mg tablets.  I will give him a dose of 2 Percocet 5 mg tablets in the ED as well as an additional 325 mg of acetaminophen .     Clinical Course as of 10/01/23 0734  Sat Oct 01, 2023  0719 US  Venous Img Lower Unilateral Right I independently viewed and interpreted the patient's ultrasound and I see no obvious sign of DVT. [CF]  0719 I reassessed the patient and he is again sleeping comfortably.  He does have tenderness to palpation of the affected leg, but  he has not yet had a trial of antibiotics.  Although he has a very mild leukocytosis the rest of his workup is reassuring and he does not meet sepsis criteria.  He is afebrile.  I strongly considered admission but he does not yet meet inpatient criteria and I think he deserves  a trial of outpatient antibiotics.  I am ordering a dose of ceftriaxone  1 g IV as well as doxycycline  100 mg p.o. to begin treatment and I will treat him with the prescriptions listed below.  I given strict return precautions as well as follow-up recommendations and he agrees with the plan that we discussed for outpatient treatment.  The patient's medical screening exam is reassuring with no indication of an emergent medical condition requiring hospitalization or additional evaluation at this point.  The patient is safe and appropriate for discharge and outpatient follow up. [CF]    Clinical Course User Index [CF] Gordan Huxley, MD     FINAL CLINICAL IMPRESSION(S) / ED DIAGNOSES   Final diagnoses:  Cellulitis of right lower leg     Rx / DC Orders   ED Discharge Orders          Ordered    cefadroxil  (DURICEF) 500 MG capsule  2 times daily        10/01/23 0724    doxycycline  (VIBRAMYCIN ) 100 MG capsule  2 times daily        10/01/23 9275             Note:  This document was prepared using Dragon voice recognition software and may include unintentional dictation errors.   Gordan Huxley, MD 10/01/23 [phone]

## 2023-10-01 NOTE — ED Notes (Signed)
 Pt presented to ED with c/o right lower leg redness/ swelling starting 3-4 days prior. Denies injury/ trauma. Pt states hx DM. Pt also endorses pain.

## 2023-10-01 NOTE — Discharge Instructions (Addendum)
 You have been seen today in the Emergency Department (ED) for cellulitis, a superficial skin infection. Please take your antibiotics as prescribed for their ENTIRE prescribed duration.  Take Tylenol  or Motrin as needed for pain, but only as written on the box.  You can also continue to take the Percocet that you are prescribed by your regular doctor, but remember that you should not take additional Tylenol  along with the prescribed Percocet  Please follow up with your doctor early this coming week for recheck of your infection if you are not improving.  Call your doctor sooner or return to the ED if you develop worsening signs of infection such as: increased redness, increased pain, pus, fever, or other symptoms that concern you.

## 2023-10-03 ENCOUNTER — Inpatient Hospital Stay: Attending: Oncology

## 2023-10-03 DIAGNOSIS — C9110 Chronic lymphocytic leukemia of B-cell type not having achieved remission: Secondary | ICD-10-CM | POA: Insufficient documentation

## 2023-10-03 DIAGNOSIS — L0390 Cellulitis, unspecified: Secondary | ICD-10-CM | POA: Insufficient documentation

## 2023-10-03 DIAGNOSIS — D509 Iron deficiency anemia, unspecified: Secondary | ICD-10-CM | POA: Insufficient documentation

## 2023-10-03 LAB — CMP (CANCER CENTER ONLY)
ALT: 16 U/L (ref 0–44)
AST: 26 U/L (ref 15–41)
Albumin: 3.5 g/dL (ref 3.5–5.0)
Alkaline Phosphatase: 67 U/L (ref 38–126)
Anion gap: 7 (ref 5–15)
BUN: 14 mg/dL (ref 8–23)
CO2: 24 mmol/L (ref 22–32)
Calcium: 8.2 mg/dL — ABNORMAL LOW (ref 8.9–10.3)
Chloride: 105 mmol/L (ref 98–111)
Creatinine: 1.21 mg/dL (ref 0.61–1.24)
GFR, Estimated: >60 mL/min (ref >60)
Glucose, Bld: 88 mg/dL (ref 70–99)
Potassium: 4.3 mmol/L (ref 3.5–5.1)
Sodium: 136 mmol/L (ref 135–145)
Total Bilirubin: 0.8 mg/dL (ref 0.0–1.2)
Total Protein: 6.5 g/dL (ref 6.5–8.1)

## 2023-10-03 LAB — CBC WITH DIFFERENTIAL (CANCER CENTER ONLY)
Abs Immature Granulocytes: 0.38 K/uL — ABNORMAL HIGH (ref 0.00–0.07)
Basophils Absolute: 0.1 K/uL (ref 0.0–0.1)
Basophils Relative: 1 %
Eosinophils Absolute: 0.2 K/uL (ref 0.0–0.5)
Eosinophils Relative: 2 %
HCT: 29.6 % — ABNORMAL LOW (ref 39.0–52.0)
Hemoglobin: 9.1 g/dL — ABNORMAL LOW (ref 13.0–17.0)
Immature Granulocytes: 3 %
Lymphocytes Relative: 60 %
Lymphs Abs: 7.5 K/uL — ABNORMAL HIGH (ref 0.7–4.0)
MCH: 28.8 pg (ref 26.0–34.0)
MCHC: 30.7 g/dL (ref 30.0–36.0)
MCV: 93.7 fL (ref 80.0–100.0)
Monocytes Absolute: 0.8 K/uL (ref 0.1–1.0)
Monocytes Relative: 7 %
Neutro Abs: 3.2 K/uL (ref 1.7–7.7)
Neutrophils Relative %: 27 %
Platelet Count: 152 K/uL (ref 150–400)
RBC: 3.16 MIL/uL — ABNORMAL LOW (ref 4.22–5.81)
RDW: 17.7 % — ABNORMAL HIGH (ref 11.5–15.5)
WBC Count: 11.6 K/uL — ABNORMAL HIGH (ref 4.0–10.5)
nRBC: 0 % (ref 0.0–0.2)

## 2023-10-03 LAB — RETIC PANEL
Immature Retic Fract: 17.2 % — ABNORMAL HIGH (ref 2.3–15.9)
RBC.: 3.14 MIL/uL — ABNORMAL LOW (ref 4.22–5.81)
Retic Count, Absolute: 38.3 K/uL (ref 19.0–186.0)
Retic Ct Pct: 1.2 % (ref 0.4–3.1)
Reticulocyte Hemoglobin: 27.9 pg — ABNORMAL LOW (ref >27.9)

## 2023-10-03 LAB — PATHOLOGIST SMEAR REVIEW

## 2023-10-03 LAB — SAMPLE TO BLOOD BANK

## 2023-10-04 DIAGNOSIS — E119 Type 2 diabetes mellitus without complications: Secondary | ICD-10-CM | POA: Diagnosis not present

## 2023-10-04 DIAGNOSIS — E039 Hypothyroidism, unspecified: Secondary | ICD-10-CM | POA: Diagnosis not present

## 2023-10-05 ENCOUNTER — Inpatient Hospital Stay

## 2023-10-05 ENCOUNTER — Inpatient Hospital Stay (HOSPITAL_BASED_OUTPATIENT_CLINIC_OR_DEPARTMENT_OTHER): Admitting: Oncology

## 2023-10-05 ENCOUNTER — Encounter: Payer: Self-pay | Admitting: Oncology

## 2023-10-05 VITALS — BP 149/81 | HR 63 | Temp 97.4°F | Resp 16 | Wt 215.0 lb

## 2023-10-05 VITALS — BP 150/76 | HR 60 | Temp 95.0°F | Resp 18

## 2023-10-05 DIAGNOSIS — Z5111 Encounter for antineoplastic chemotherapy: Secondary | ICD-10-CM | POA: Diagnosis not present

## 2023-10-05 DIAGNOSIS — C9110 Chronic lymphocytic leukemia of B-cell type not having achieved remission: Secondary | ICD-10-CM | POA: Diagnosis not present

## 2023-10-05 DIAGNOSIS — L0390 Cellulitis, unspecified: Secondary | ICD-10-CM | POA: Insufficient documentation

## 2023-10-05 DIAGNOSIS — L02229 Furuncle of trunk, unspecified: Secondary | ICD-10-CM | POA: Diagnosis not present

## 2023-10-05 DIAGNOSIS — D509 Iron deficiency anemia, unspecified: Secondary | ICD-10-CM | POA: Diagnosis not present

## 2023-10-05 DIAGNOSIS — L03115 Cellulitis of right lower limb: Secondary | ICD-10-CM

## 2023-10-05 MED ORDER — IRON SUCROSE 20 MG/ML IV SOLN
200.0000 mg | Freq: Once | INTRAVENOUS | Status: AC
  Administered 2023-10-05: 200 mg via INTRAVENOUS
  Filled 2023-10-05: qty 10

## 2023-10-05 NOTE — Assessment & Plan Note (Addendum)
 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 Lab Results  Component Value Date   HGB 9.1 (L) 10/03/2023   TIBC 307 08/17/2023   IRONPCTSAT 15 (L) 08/17/2023   FERRITIN 24 08/17/2023   recurrent iron  deficiency anemia.   Recommend IV venofer  weekly x3  Colonoscopy findings were reviewed. No obvious source of bleeding.

## 2023-10-05 NOTE — Progress Notes (Signed)
 Nutrition Follow-up:  Patient with CLL.  Patient receiving venofer  and zanubrutinib   Met with patient during infusion of venofer .  Reports that he tried glucerna and ensure max protein but did not like them.  Says his PCP wants him to watch his carb intake.     Medications: metformin   Labs: glucose WNL on 9/8  Anthropometrics:   Weight 215 lb, stable   NUTRITION DIAGNOSIS: Unintentional weight loss stable    INTERVENTION:  Discussed plate method for DM.  Handout given Encouraged sugar free beverages.  Drinks gallon of milk in 2 days.  Encouraged limit to 2 cups a day     MONITORING, EVALUATION, GOAL: weight trends, intake   NEXT VISIT: as needed  Paul Briggs SOLON, CSO, LDN Registered Dietitian [phone]

## 2023-10-05 NOTE — Assessment & Plan Note (Signed)
 Continue treatment as mentioned above.

## 2023-10-05 NOTE — Assessment & Plan Note (Signed)
Finish course of doxycycline

## 2023-10-05 NOTE — Progress Notes (Signed)
 Hematology/Oncology Progress note [phone] [phone]        ASSESSMENT & PLAN:   Cancer Staging  CLL (chronic lymphocytic leukemia) (HCC) Staging form: Chronic Lymphocytic Leukemia / Small Lymphocytic Lymphoma, AJCC 8th Edition - Clinical stage from 12/28/2021: Modified Rai Stage III (Modified Rai risk: High, Lymphocytosis: Present, Adenopathy: Present, Organomegaly: Absent, Anemia: Present, Thrombocytopenia: Absent) - Signed by Babara Call, MD on 01/20/2022   CLL (chronic lymphocytic leukemia) [facility]) Labs are reviewed and discussed with patient. CT chest abdomen pelvis results reviewed and discussed with patient.   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  50% of bone marrow is involved with CLL.  Labs are reviewed and discussed with patient.  Hold Zanubrutinib  160 mg twice daily due to acute cellulitis  Encounter for antineoplastic chemotherapy Continue treatment as mentioned above.   IDA (iron  deficiency anemia) Anemia is likely multifactorial, could be due to CLL, anemia due to chronic kidney disease and IDA. Lab Results  Component Value Date   HGB 9.1 (L) 10/03/2023   TIBC 307 08/17/2023   IRONPCTSAT 15 (L) 08/17/2023   FERRITIN 24 08/17/2023   recurrent iron  deficiency anemia.   Recommend IV venofer  weekly x3  Colonoscopy findings were reviewed. No obvious source of bleeding.       Cellulitis Right lower extremity cellulitis  Recommend patient to finish course of doxycycline .  Frequent infections. HIV negative in 2023. Check immunoglobulin   Boil of trunk Finish course of doxycycline .  Orders Placed This Encounter  Procedures   Immunoglobulins, QN, A/E/G/M    Standing Status:   Future    Expected Date:   10/10/2023    Expiration Date:   01/03/2024   CBC with Differential (Cancer Center Only)    Standing Status:   Future    Expected Date:    10/26/2023    Expiration Date:   01/24/2024   Retic Panel    Standing Status:   Future    Expected Date:   10/26/2023    Expiration Date:   01/24/2024   Follow up   3 weeks   All questions were answered. The patient knows to call the clinic with any problems, questions or concerns.  Call Babara, MD, PhD [facility] Hematology Oncology 10/05/2023    CHIEF COMPLAINTS/PURPOSE OF CONSULTATION:  CLL  HISTORY OF PRESENTING ILLNESS:  Paul Nadara Browner [name] 78 y.o. male presents for follow up of CLL I have reviewed his chart and materials related to his cancer extensively and collaborated history with the patient. Summary of oncologic history is as follows: Oncology History  CLL (chronic lymphocytic leukemia) (HCC)  12/25/2021 - 12/26/2021 Hospital Admission   Patient was admitted due to pain and swelling of right ankle, right knee and left wrist.  Patient was found to have WBC 30.4, with neutrophil 29% and lymphocyte 2%.  Peripheral smear showed leukocytosis with slight left shift in myeloid series and lymphocytosis with abnormal morphology.  Uric acid 5.5, worsening renal function with creatinine 1.35, BUN 18, GFR 55 (baseline creatinine 1.08 on 07/06/2021), lactic acid 1.1.  ESR normal, uric acid 5.5 normal, CRP 22.2, LDH 182  Orthopedic surgery was consulted s/p arthrocentesis, cell count 296 with 72% neutrophil, no crystal. Fluid negative for growth.  Patient received cefepime  and vancomycin  while in the hospital, Patient was discharged on Keflex  for 5 days.   12/28/2021 Cancer Staging   Staging form: Chronic Lymphocytic Leukemia / Small Lymphocytic Lymphoma,  AJCC 8th Edition - Clinical stage from 12/28/2021: Modified Rai Stage III (Modified Rai risk: High, Lymphocytosis: Present, Adenopathy: Present, Organomegaly: Absent, Anemia: Present, Thrombocytopenia: Absent) - Signed by Babara Call, MD on 01/20/2022 Stage prefix: Initial diagnosis Hemoglobin (Hgb) (g/dL): 89.8   87/01/7974 Initial  Diagnosis   CLL (chronic lymphocytic leukemia)   12/27/21 peripheral blood flowcytometry showed Involvement by CD5+, CD23+, CD20+, CD22+ clonal B cell population, phenotype typical for 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    01/05/2022 Imaging   CT chest abdomen pelvis wo contrast 1. Multiple prominent borderline enlarged and mildly enlarged lymph nodes, most evident in the low anatomic pelvis, as above, compatible with reported clinical history of CLL. 2. There also several small pulmonary nodules in the lungs measuring 5 mm or less in size. This is nonspecific, but statistically likely benign. No follow-up needed if patient is low-risk (and has no known or suspected primary neoplasm). Non-contrast chest CT can be considered in 12 months if patient is high-risk. This recommendation 3. Aortic atherosclerosis, in addition to left main and 2 vessel coronary artery disease. Please note that although the presence of coronary artery calcium  documents the presence of coronary artery disease, the severity of this disease and any potential stenosis cannot be assessed on this non-gated CT examination. Assessment for  potential risk factor modification, dietary therapy or pharmacologic therapy may be warranted, if clinically indicated. 4. There are calcifications of the aortic valve. Echocardiographic correlation for evaluation of potential valvular dysfunction may be warranted if clinically indicated. 5. 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 6. Diverticulosis without evidence of acute diverticulitis at this time. 7. Mild cardiomegaly.   08/26/2022 Imaging   CT abdomen pelvis w contrast  1. Subcutaneous fat stranding within the left lower quadrant anterior abdominal wall extending into the anterior proximal left thigh, likely  related to contusion given history of recent trauma. No fluid collection or hematoma. 2. No acute displaced fracture. 3. Retroperitoneal and pelvic lymphadenopathy as above, with waxing and waning appearance. 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 4. 1.9 cm hypodensity within the medial aspect of the spleen,nonspecific. Leukemic involvement of the spleen cannot be excluded. 5. Distal colonic diverticulosis without diverticulitis. 6.  Aortic Atherosclerosis    07/24/2023 Imaging   MRI cervical spine with and without contrast  1. Enhancing and indeterminate right C4 posterior element/facet bone lesion, which is superimposed on diffusely abnormal but nonspecific generalized decreased T1 marrow signal in the visible spine and at the skull base.   Marrow infiltrative processes such as myelofibrosis or lymphoma cannot be excluded. But the absence of other suspicious marrow edema or enhancement argues against metastatic disease or multiple myeloma. And furthermore similar generalized abnormal marrow signal can be caused by chronic anemia, smoking, obesity.   2. Underlying severe chronic cervical spine degeneration with reversal of lordosis and multifactorial degenerative cervical spinal stenosis at all levels. Associated moderate spinal stenosis and moderate (up to severe) spinal cord mass effect C3-C4 through C5-C6. No associated spinal cord edema or myelomalacia. Associated moderate or severe degenerative neural foraminal stenosis at the bilateral C4 through C8 nerve levels.       08/19/2023 Bone Marrow Biopsy   Bone marrow biopsy  aspirate clot core  - Hypercellular bone marrow (80%) involved by the patient's known  chronic lymphocytic leukemia at approximately 50% of the cellular  marrow.  08/23/2022 Colonoscopy showed - Likely malignant partially obstructing tumor in the distal ascending colon. Biopsied. Tattooed. - Three 4 to 6 mm polyps in the transverse colon  and in the ascending colon, removed with a cold snare. Resected and retrieved. - Five 3 to 7 mm polyps in the rectum, in the descending colon, in the transverse colon and in the ascending colon, removed with a hot snare. Resected and retrieved. 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 - Non- bleeding internal hemorrhoids.  Pathology showed 1. Ascending Colon Polyp, x2 cold snare - TUBULAR ADENOMA(S) (MULTIPLE FRAGMENTS) - NEGATIVE FOR HIGH-GRADE DYSPLASIA OR MALIGNANCY 2. Ascending Colon Polyp, hot snare - TUBULAR ADENOMA (MULTIPLE FRAGMENTS) - NEGATIVE FOR HIGH-GRADE DYSPLASIA OR MALIGNANCY 3. Ascending Colon Biopsy, mass cbx - TUBULAR ADENOMA (MULTIPLE FRAGMENTS) - SEE NOTE 4. Transverse Colon Polyp, hot snare; cold snare - TUBULAR ADENOMA (MULTIPLE FRAGMENTS) - NEGATIVE FOR HIGH-GRADE DYSPLASIA OR MALIGNANCY 5. Descending Colon Polyp, x2 hot snare - TUBULAR ADENOMA(S) (MULTIPLE FRAGMENTS) - NEGATIVE FOR HIGH-GRADE DYSPLASIA OR MALIGNANCY 6. Rectum, polyp(s), hot snare - TUBULOVILLOUS ADENOMA (MULTIPLE FRAGMENTS) - NEGATIVE FOR HIGH-GRADE DYSPLASIA OR MALIGNANCY I&D Recent admission due to sepsis due to cellulitis, left groin abscess, s/p I&D  04/24/2023 s/p colonoscopy, removal of small polyps.  04/26/2023 CT chest wo contrast  Pulmonary infiltrates as described and left pleural effusion correlate with bronchopneumonia. Follow-up recommended as clinically needed. Small pericardial effusion, correlation with echocardiography recommended as clinically needed.  04/27/2023 CT chest PE angiogram  No definite evidence of pulmonary embolus. Small left pleural effusion is noted. Airspace opacities are noted in lingular segment of left upper lobe as well as in left lower lobe most consistent with pneumonia. Minimal pericardial effusion. Aortic Atherosclerosis   Recent hospitalization due to right knee pain due to septic prepatella bursitis of right knee. He was treated wit IV abx. S/p FNA  aspiration, culture was negative.   INTERVAL HISTORY Paul Thomley [name] is a 78 y.o. male who has above history reviewed by me today presents for follow up visit for CLL, iron  deficiency anemia, colon tubular adenoma with high-grade dysplastic status post resection. He feels well today.  Recent ER visit due to right lower extremity redness, US  negative for DVT. Felt to be due to cellulits, currently on a course of doxycycline . Redness has slightly improved. Also he has a skin boil on right side of trunk. Improved after taking antibiotics.  He denies fever or chills.  Fatigue has improved.  Patient is status post bone marrow biopsy and presents to discuss results.     MEDICAL HISTORY:  Past Medical History:  Diagnosis Date   Adenoma of left adrenal gland    Anemia    Aortic atherosclerosis (HCC)    Blood transfusion without reported diagnosis    BPH (benign prostatic hyperplasia)    Bradycardia    CAD (coronary artery disease) 08/26/2022   a.) cCTA 08/26/2022: Ca2+ 14.3 (12th %'ile for age/sex.race match control); (<25%) pLAD   Cellulitis of left thigh 08/2022   Chronic pain syndrome    a.) on COT managed by pain management   Chronic, continuous use of opioids    a.) chronic pain syndrome/chronic back pain; managed by pain management   CKD (chronic kidney disease) stage 3, GFR 30-59 ml/min (HCC)    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   a.) Rai stage III   DDD (degenerative disc disease), cervical    Difficult airway 10/22/2015   a.) 1st attempt with glidescope  --> macroglossic (  unsuccessful); b.) 2nd attempt with Mac 4 and direct laryngoscopy with a 8.0 ETT --> tube too large to pass through the cords; b.) 3rd attempt (successful) with a Mac 4 and a 7.5 tube   Diverticulosis    DM (diabetes mellitus), type 2 (HCC)    Dysplasia of prostate    Erectile dysfunction    Frequent falls    Heart failure with mildly reduced ejection fraction (HFmrEF) (HCC)    a.)  TTE 10/23/2015: EF 60-65%, mod-sev LVH, mild biatrial dil, degen MV disease, AoV sclerosis, asc Ao 38 mm, G1DD; b.) TTE 08/05/2022: EF 45-50%, mod-sev LVH (speckled pattern), sev biatrial dil, mild MR, AoV sclerosis, G1DD   Hepatic flexure mass 08/23/2022   a.) colonoscopy 08/23/2022: 14 x 15 mm partially obstructing ulcerated distal ascending colon mass; tissue friable --> pathology resulted as tubular adenoma, however felt to be false (-) --> referral to sugery and colectomy recommended; b.) s/p RIGHT hemicolectomy 09/28/2022   History of MRSA infection 08/28/2022   a.) MRSA PCR (+) 08/28/2022; culture from LEFT thigh abscess   HTN (hypertension)    Hyperlipidemia    Hypogonadism in male    Hypothyroidism    IDA (iron  deficiency anemia)    LBBB (left bundle branch block)    Long-term use of aspirin  therapy    Lumbar spinal stenosis    Mild cardiomegaly    Multiple lung nodules on CT    NICM (nonischemic cardiomyopathy) (HCC)    a.) TTE 10/23/2015: EF 60-65; b.) TTE 08/05/2022: EF 45-50%   Orchitis and epididymitis 06/16/2013   OSA on CPAP    Palindromic rheumatism, hand    Pericardial effusion    Prostatitis    Sepsis (HCC) 08/2022   Subdural hematoma (HCC) 10/22/2015   a.) s/p traumatic mechanical fall --> CT head 10/22/2015: high-density SDH the left cerebral convexity with13 mm midline shift --> s/p LEFT frontal burr hole craniotomy   Tubular adenoma of colon    Ventral hernia     SURGICAL HISTORY: Past Surgical History:  Procedure Laterality Date   BACK SURGERY     BIOPSY  08/21/2022   Procedure: BIOPSY;  Surgeon: Onita Elspeth Sharper, DO;  Location: [facility] ENDOSCOPY;  Service: Gastroenterology;;   BIOPSY  08/23/2022   Procedure: BIOPSY;  Surgeon: Onita Elspeth Sharper, DO;  Location: [facility] ENDOSCOPY;  Service: Gastroenterology;;   BIOPSY  02/28/2023   Procedure: BIOPSY;  Surgeon: Onita Elspeth Sharper, DO;  Location: [facility] ENDOSCOPY;  Service: Gastroenterology;;   SOLMON DAKIN  OF CRANIUM Left 10/22/2015   COLON SURGERY  09/28/2022   COLONOSCOPY WITH PROPOFOL  N/A 08/23/2022   Procedure: COLONOSCOPY WITH PROPOFOL ;  Surgeon: Onita Elspeth Sharper, DO;  Location: [facility] ENDOSCOPY;  Service: Gastroenterology;  Laterality: N/A;   COLONOSCOPY WITH PROPOFOL  N/A 02/28/2023   Procedure: COLONOSCOPY WITH PROPOFOL ;  Surgeon: Onita Elspeth Sharper, DO;  Location: [facility] ENDOSCOPY;  Service: Gastroenterology;  Laterality: N/A;  DM   COLONOSCOPY WITH PROPOFOL  N/A 04/25/2023   Procedure: COLONOSCOPY WITH PROPOFOL ;  Surgeon: Onita Elspeth Sharper, DO;  Location: [facility] ENDOSCOPY;  Service: Gastroenterology;  Laterality: N/A;  DM   ESOPHAGOGASTRODUODENOSCOPY (EGD) WITH PROPOFOL  N/A 08/21/2022   Procedure: ESOPHAGOGASTRODUODENOSCOPY (EGD) WITH PROPOFOL ;  Surgeon: Onita Elspeth Sharper, DO;  Location: [facility] ENDOSCOPY;  Service: Gastroenterology;  Laterality: N/A;   ESOPHAGOGASTRODUODENOSCOPY (EGD) WITH PROPOFOL  N/A 02/28/2023   Procedure: ESOPHAGOGASTRODUODENOSCOPY (EGD) WITH PROPOFOL ;  Surgeon: Onita Elspeth Sharper, DO;  Location: [facility] ENDOSCOPY;  Service: Gastroenterology;  Laterality: N/A;   GIVENS CAPSULE STUDY N/A 08/23/2022  Procedure: GIVENS CAPSULE STUDY;  Surgeon: Onita Elspeth Sharper, DO;  Location: [facility] ENDOSCOPY;  Service: Gastroenterology;  Laterality: N/A;   HERNIA REPAIR     INGUINAL HERNIA REPAIR Right 03/09/2023   Procedure: HERNIA REPAIR INGUINAL ADULT, open, RNFA to assist;  Surgeon: Jordis Laneta FALCON, MD;  Location: [facility] ORS;  Service: General;  Laterality: Right;   IR BONE MARROW BIOPSY & ASPIRATION  08/19/2023   IR US  GUIDE BX ASP/DRAIN  06/27/2023   IRRIGATION AND DEBRIDEMENT ABSCESS Left 08/28/2022   Procedure: IRRIGATION AND DEBRIDEMENT ABSCESS LEFT UPPER THIGH/GROIN;  Surgeon: Jordis Laneta FALCON, MD;  Location: [facility] ORS;  Service: General;  Laterality: Left;   LAPAROSCOPIC RIGHT COLECTOMY Right 09/28/2022   Procedure: LAPAROSCOPIC RIGHT COLECTOMY, RNFA to assist;  Surgeon:  Jordis Laneta FALCON, MD;  Location: [facility] ORS;  Service: General;  Laterality: Right;   POLYPECTOMY  08/23/2022   Procedure: POLYPECTOMY;  Surgeon: Onita Elspeth Sharper, DO;  Location: [facility] ENDOSCOPY;  Service: Gastroenterology;;   POLYPECTOMY  04/25/2023   Procedure: POLYPECTOMY;  Surgeon: Onita Elspeth Sharper, DO;  Location: [facility] ENDOSCOPY;  Service: Gastroenterology;;   SUBMUCOSAL TATTOO INJECTION  08/23/2022   Procedure: SUBMUCOSAL TATTOO INJECTION;  Surgeon: Onita Elspeth Sharper, DO;  Location: [facility] ENDOSCOPY;  Service: Gastroenterology;;   TONSILLECTOMY     VENTRAL HERNIA REPAIR N/A 09/28/2022   Procedure: HERNIA REPAIR VENTRAL ADULT;  Surgeon: Jordis Laneta FALCON, MD;  Location: [facility] ORS;  Service: General;  Laterality: N/A;    SOCIAL HISTORY: Social History   Socioeconomic History   Marital status: Married    Spouse name: Not on file   Number of children: Not on file   Years of education: Not on file   Highest education level: 5th grade  Occupational History   Not on file  Tobacco Use   Smoking status: Former    Types: Cigarettes    Passive exposure: Past   Smokeless tobacco: Never   Tobacco comments:    Stop smoking iver 40  plus years ago  Vaping Use   Vaping status: Never Used  Substance and Sexual Activity   Alcohol use: No   Drug use: No   Sexual activity: Not Currently  Other Topics Concern   Not on file  Social History Narrative   Not on file   Social Drivers of Health   Financial Resource Strain: Patient Declined (04/26/2023)   Received from [facility]   Overall Financial Resource Strain (CARDIA)    Difficulty of Paying Living Expenses: Patient declined  Recent Concern: Financial Resource Strain - Medium Risk (02/24/2023)   Received from [facility]   Overall Financial Resource Strain (CARDIA)    Difficulty of Paying Living Expenses: Somewhat hard  Food Insecurity: No Food Insecurity (06/23/2023)   Hunger Vital Sign     Worried About Running Out of Food in the Last Year: Never true    Ran Out of Food in the Last Year: Never true  Transportation Needs: Patient Declined (08/07/2023)   Received from [facility] - Transportation    In the past 12 months, has lack of transportation kept you from medical appointments or from getting medications?: Patient declined    Lack of Transportation (Non-Medical): Patient declined  Physical Activity: Unknown (07/26/2022)   Exercise Vital Sign    Days of Exercise per Week: Patient declined    Minutes of Exercise per Session: Not on file  Stress: Stress Concern Present (07/26/2022)   [employer]  of Occupational Health - Occupational Stress Questionnaire    Feeling of Stress : To some extent  Social Connections: Socially Integrated (06/23/2023)   Social Connection and Isolation Panel    Frequency of Communication with Friends and Family: More than three times a week    Frequency of Social Gatherings with Friends and Family: Patient declined    Attends Religious Services: More than 4 times per year    Active Member of [employer] or Organizations: Yes    Attends Banker Meetings: Never    Marital Status: Married  Catering manager Violence: Not At Risk (06/23/2023)   Humiliation, Afraid, Rape, and Kick questionnaire    Fear of Current or Ex-Partner: No    Emotionally Abused: No    Physically Abused: No    Sexually Abused: No    FAMILY HISTORY: Family History  Problem Relation Age of Onset   Cancer Maternal Aunt    ADD / ADHD Daughter    Asthma Son    Prostate cancer Neg Hx    Kidney disease Neg Hx    Kidney cancer Neg Hx    Bladder Cancer Neg Hx     ALLERGIES:  has no known allergies.  MEDICATIONS:  Current Outpatient Medications  Medication Sig Dispense Refill   acetaminophen  (TYLENOL ) 325 MG tablet Take 2 tablets (650 mg total) by mouth every 6 (six) hours as needed for mild pain (pain score 1-3) or fever.     albuterol   (VENTOLIN  HFA) 108 (90 Base) MCG/ACT inhaler Inhale 2 puffs into the lungs every 4 (four) hours as needed for wheezing or shortness of breath.     aspirin  EC 81 MG tablet Take 1 tablet (81 mg total) by mouth daily. Swallow whole.     cefadroxil  (DURICEF) 500 MG capsule Take 1 capsule (500 mg total) by mouth 2 (two) times daily for 10 days. 20 capsule 0   doxycycline  (VIBRAMYCIN ) 100 MG capsule Take 1 capsule (100 mg total) by mouth 2 (two) times daily for 10 days. 20 capsule 0   empagliflozin  (JARDIANCE ) 10 MG TABS tablet Take 1 tablet (10 mg total) by mouth daily before breakfast. 90 tablet 2   fluticasone -salmeterol (ADVAIR) 250-50 MCG/ACT AEPB Inhale 1 puff into the lungs in the morning and at bedtime.     furosemide  (LASIX ) 20 MG tablet Take 20 mg by mouth daily as needed.     hydrALAZINE  (APRESOLINE ) 50 MG tablet Take 2 tablets (100 mg total) by mouth 2 (two) times daily. 180 tablet 3   ipratropium (ATROVENT) 0.03 % nasal spray Place 2 sprays into the nose 2 (two) times daily.     levothyroxine  (SYNTHROID ) 200 MCG tablet Take 1 tablet (200 mcg total) by mouth daily before breakfast. 90 tablet 0   metFORMIN  (GLUCOPHAGE ) 500 MG tablet Take 2 tablets (1,000 mg total) by mouth 2 (two) times daily with a meal. 120 tablet 0   metoprolol  succinate (TOPROL  XL) 25 MG 24 hr tablet Take 1 tablet (25 mg total) by mouth daily. 90 tablet 1   oxyCODONE -acetaminophen  (PERCOCET) 10-325 MG tablet Take 1 tablet by mouth every 8 (eight) hours as needed for pain. Must last 30 days. 90 tablet 0   [START ON 10/17/2023] oxyCODONE -acetaminophen  (PERCOCET) 10-325 MG tablet Take 1 tablet by mouth every 8 (eight) hours as needed for pain. Must last 30 days. 90 tablet 0   [START ON 11/16/2023] oxyCODONE -acetaminophen  (PERCOCET) 10-325 MG tablet Take 1 tablet by mouth every 8 (eight) hours as needed for  pain. Must last 30 days. 90 tablet 0   rosuvastatin  (CRESTOR ) 40 MG tablet Take 1 tablet (40 mg total) by mouth daily. 90  tablet 1   sacubitril -valsartan  (ENTRESTO ) 97-103 MG Take 1 tablet by mouth 2 (two) times daily. 120 tablet 3   spironolactone  (ALDACTONE ) 25 MG tablet Take 0.5 tablets (12.5 mg total) by mouth daily. 45 tablet 3   zanubrutinib  (BRUKINSA ) 80 MG capsule Take 2 capsules (160 mg total) by mouth 2 (two) times daily. 120 capsule 2   No current facility-administered medications for this visit.    Review of Systems  Constitutional:  Positive for fatigue. Negative for appetite change, chills, fever and unexpected weight change.  HENT:   Negative for hearing loss and voice change.   Eyes:  Negative for eye problems and icterus.  Respiratory:  Negative for chest tightness, cough and shortness of breath.   Cardiovascular:  Negative for chest pain and leg swelling.  Gastrointestinal:  Negative for abdominal distention and abdominal pain.  Endocrine: Negative for hot flashes.  Genitourinary:  Negative for difficulty urinating, dysuria and frequency.   Musculoskeletal:  Positive for arthralgias.  Skin:  Negative for itching and rash.       Skin boil  Neurological:  Negative for light-headedness and numbness.  Hematological:  Negative for adenopathy. Does not bruise/bleed easily.  Psychiatric/Behavioral:  Negative for confusion.      PHYSICAL EXAMINATION: ECOG PERFORMANCE STATUS: 1 - Symptomatic but completely ambulatory  Vitals:   10/05/23 1354  BP: (!) 149/81  Pulse: 63  Resp: 16  Temp: (!) 97.4 F (36.3 C)  SpO2: 99%   Filed Weights   10/05/23 1354  Weight: 215 lb (97.5 kg)    Physical Exam Constitutional:      General: He is not in acute distress.    Appearance: He is obese. He is not diaphoretic.  HENT:     Head: Normocephalic and atraumatic.  Eyes:     General: No scleral icterus.    Pupils: Pupils are equal, round, and reactive to light.  Cardiovascular:     Rate and Rhythm: Normal rate.     Heart sounds: No murmur heard. Pulmonary:     Effort: Pulmonary effort is  normal. No respiratory distress.     Breath sounds: No wheezing.  Abdominal:     General: There is no distension.     Palpations: Abdomen is soft.  Genitourinary:    Comments:   Musculoskeletal:     Cervical back: Normal range of motion and neck supple.  Skin:    General: Skin is warm and dry.     Findings: No erythema.     Comments: Skin boil on trunk Right lower extremity erythematous changes and swelling.   Neurological:     Mental Status: He is alert and oriented to person, place, and time. Mental status is at baseline.     Cranial Nerves: No cranial nerve deficit.     Motor: No abnormal muscle tone.  Psychiatric:        Mood and Affect: Mood and affect normal.        LABORATORY DATA:  I have reviewed the data as listed    Latest Ref Rng & Units 10/03/2023    1:35 PM 10/01/2023    1:29 AM 08/19/2023    7:57 AM  CBC  WBC 4.0 - 10.5 K/uL 11.6  12.3  6.9   Hemoglobin 13.0 - 17.0 g/dL 9.1  9.1  89.5   Hematocrit 39.0 -  52.0 % 29.6  29.6  33.2   Platelets 150 - 400 K/uL 152  164  158       Latest Ref Rng & Units 10/03/2023    1:35 PM 10/01/2023    1:29 AM 08/17/2023   11:40 AM  CMP  Glucose 70 - 99 mg/dL 88  893  89   BUN 8 - 23 mg/dL 14  17  25    Creatinine 0.61 - 1.24 mg/dL 8.78  8.74  8.77   Sodium 135 - 145 mmol/L 136  141  137   Potassium 3.5 - 5.1 mmol/L 4.3  3.9  4.1   Chloride 98 - 111 mmol/L 105  108  109   CO2 22 - 32 mmol/L 24  26  21    Calcium  8.9 - 10.3 mg/dL 8.2  9.0  8.8   Total Protein 6.5 - 8.1 g/dL 6.5  6.8  7.4   Total Bilirubin 0.0 - 1.2 mg/dL 0.8  0.7  0.9   Alkaline Phos 38 - 126 U/L 67  66  83   AST 15 - 41 U/L 26  33  34   ALT 0 - 44 U/L 16  20  19       RADIOGRAPHIC STUDIES: I have personally reviewed the radiological images as listed and agreed with the findings in the report. US  Venous Img Lower Unilateral Right Result Date: 10/01/2023 EXAM: ULTRASOUND DUPLEX OF THE RIGHT LOWER EXTREMITY VEINS TECHNIQUE: Duplex ultrasound using B-mode/gray  scaled imaging and Doppler spectral analysis and color flow was obtained of the deep venous structures of the right lower extremity. COMPARISON: None. CLINICAL HISTORY: Pain, erythema, edema of RLE. FINDINGS: The visualized veins of the lower extremity are patent and free of echogenic thrombus. The veins demonstrate good compressibility with normal color flow study and spectral analysis. IMPRESSION: 1. No evidence of DVT. Electronically signed by: Evalene Coho MD 10/01/2023 06:41 AM EDT RP Workstation: [id number]   DG Chest 2 View Result Date: 10/01/2023 EXAM: 2 VIEW(S) XRAY OF THE CHEST 10/01/2023 01:55:05 AM COMPARISON: 10/01/2022, cardiomegaly, aortic atherosclerosis. CLINICAL HISTORY: Pt arrives with c/o right lower leg swelling and pain that started 2 days ago. Pt has redness, swelling, and pain to right lower leg. FINDINGS: LUNGS AND PLEURA: Questions perihilar and peribronchial wall thickening best appreciated on lateral view. No focal pulmonary opacity. No pulmonary edema. No pleural effusion. No pneumothorax. HEART AND MEDIASTINUM: Cardiomegaly. BONES AND SOFT TISSUES: No acute osseous abnormality. IMPRESSION: 1. Question Perihilar and peribronchial wall thickening. This could be due to of edema or infection/inflammation. Electronically signed by: Norman Gatlin MD 10/01/2023 02:23 AM EDT RP Workstation: [id number]   CT ABDOMEN PELVIS W CONTRAST Result Date: 09/16/2023 CLINICAL DATA:  Right-sided abdominal pain. Hernia repair several months ago. EXAM: CT ABDOMEN AND PELVIS WITH CONTRAST TECHNIQUE: Multidetector CT imaging of the abdomen and pelvis was performed using the standard protocol following bolus administration of intravenous contrast. RADIATION DOSE REDUCTION: This exam was performed according to the departmental dose-optimization program which includes automated exposure control, adjustment of the mA and/or kV according to patient size and/or use of iterative reconstruction technique.  CONTRAST:  OMNIPAQUE  IOHEXOL  300 MG/ML  SOLN COMPARISON:  11/12/2022 FINDINGS: Lower Chest: No acute findings. Hepatobiliary: No suspicious hepatic masses identified. Gallbladder is unremarkable. No evidence of biliary ductal dilatation. Pancreas: Stable 1 cm cystic lesion in the pancreatic uncinate process. No evidence of pancreatitis or peripancreatic fluid collections. Spleen: Within normal limits in size. Stable small low-attenuation splenic lesions, consistent  with benign etiology. Adrenals/Urinary Tract: Stable small low-attenuation left adrenal mass, consistent with benign adenoma. Stable benign-appearing bilateral renal cysts (No followup imaging is recommended). No suspicious renal masses identified. No evidence of ureteral calculi or hydronephrosis. Stomach/Bowel: Postop changes from right colectomy again seen. No mass identified. No evidence of obstruction, inflammatory process or abnormal fluid collections. Diverticulosis is seen mainly involving the sigmoid colon, however there is no evidence of diverticulitis. A new small paraumbilical ventral hernia is seen which contains only fat. Vascular/Lymphatic: No pathologically enlarged lymph nodes. No acute vascular findings. Reproductive:  No mass or other significant abnormality. Other:  None. Musculoskeletal:  No suspicious bone lesions identified. IMPRESSION: New small paraumbilical ventral hernia, which contains only fat. No other acute findings. Colonic diverticulosis, without radiographic evidence of diverticulitis. Stable 1 cm cystic lesion in pancreatic uncinate process. Recommend continued follow-up by CT or MRI in 2 years. Electronically Signed   By: Norleen DELENA Kil M.D.   On: 09/16/2023 20:37

## 2023-10-05 NOTE — Assessment & Plan Note (Signed)
 Right lower extremity cellulitis  Recommend patient to finish course of doxycycline .  Frequent infections. HIV negative in 2023. Check immunoglobulin

## 2023-10-05 NOTE — Assessment & Plan Note (Addendum)
 Labs are reviewed and discussed with patient. CT chest abdomen pelvis results reviewed and discussed with patient.   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  50% of bone marrow is involved with CLL.  Labs are reviewed and discussed with patient.  Hold Zanubrutinib  160 mg twice daily due to acute cellulitis

## 2023-10-06 LAB — CULTURE, BLOOD (ROUTINE X 2)
Culture: NO GROWTH
Culture: NO GROWTH
Special Requests: ADEQUATE

## 2023-10-10 ENCOUNTER — Inpatient Hospital Stay

## 2023-10-11 DIAGNOSIS — E039 Hypothyroidism, unspecified: Secondary | ICD-10-CM | POA: Diagnosis not present

## 2023-10-12 ENCOUNTER — Inpatient Hospital Stay

## 2023-10-12 DIAGNOSIS — E119 Type 2 diabetes mellitus without complications: Secondary | ICD-10-CM | POA: Diagnosis not present

## 2023-10-12 DIAGNOSIS — L03115 Cellulitis of right lower limb: Secondary | ICD-10-CM

## 2023-10-12 DIAGNOSIS — C9110 Chronic lymphocytic leukemia of B-cell type not having achieved remission: Secondary | ICD-10-CM | POA: Diagnosis not present

## 2023-10-12 DIAGNOSIS — R634 Abnormal weight loss: Secondary | ICD-10-CM | POA: Diagnosis not present

## 2023-10-12 DIAGNOSIS — E039 Hypothyroidism, unspecified: Secondary | ICD-10-CM | POA: Diagnosis not present

## 2023-10-12 DIAGNOSIS — R630 Anorexia: Secondary | ICD-10-CM | POA: Diagnosis not present

## 2023-10-14 ENCOUNTER — Other Ambulatory Visit (HOSPITAL_COMMUNITY): Payer: Self-pay

## 2023-10-14 ENCOUNTER — Other Ambulatory Visit: Payer: Self-pay | Admitting: Oncology

## 2023-10-14 DIAGNOSIS — C9110 Chronic lymphocytic leukemia of B-cell type not having achieved remission: Secondary | ICD-10-CM

## 2023-10-14 LAB — IMMUNOGLOBULINS A/E/G/M, SERUM
IgA: 167 mg/dL (ref 61–437)
IgE (Immunoglobulin E), Serum: 8 [IU]/mL (ref 6–495)
IgG (Immunoglobin G), Serum: 1191 mg/dL (ref 603–1613)
IgM (Immunoglobulin M), Srm: 44 mg/dL (ref 15–143)

## 2023-10-18 ENCOUNTER — Other Ambulatory Visit: Payer: Self-pay

## 2023-10-19 ENCOUNTER — Inpatient Hospital Stay

## 2023-10-19 VITALS — BP 128/69 | HR 72 | Temp 96.2°F | Resp 18

## 2023-10-19 DIAGNOSIS — C9110 Chronic lymphocytic leukemia of B-cell type not having achieved remission: Secondary | ICD-10-CM | POA: Diagnosis not present

## 2023-10-19 MED ORDER — IRON SUCROSE 20 MG/ML IV SOLN
200.0000 mg | Freq: Once | INTRAVENOUS | Status: AC
  Administered 2023-10-19: 200 mg via INTRAVENOUS
  Filled 2023-10-19: qty 10

## 2023-10-19 NOTE — Patient Instructions (Signed)

## 2023-11-01 ENCOUNTER — Telehealth: Payer: Self-pay | Admitting: Oncology

## 2023-11-01 ENCOUNTER — Inpatient Hospital Stay: Attending: Oncology

## 2023-11-01 ENCOUNTER — Telehealth: Payer: Self-pay | Admitting: *Deleted

## 2023-11-01 ENCOUNTER — Encounter: Payer: Self-pay | Admitting: Oncology

## 2023-11-01 ENCOUNTER — Inpatient Hospital Stay

## 2023-11-01 ENCOUNTER — Inpatient Hospital Stay: Admitting: Oncology

## 2023-11-01 VITALS — BP 135/79 | HR 71 | Temp 97.1°F | Resp 16 | Wt 209.7 lb

## 2023-11-01 DIAGNOSIS — R059 Cough, unspecified: Secondary | ICD-10-CM | POA: Diagnosis not present

## 2023-11-01 DIAGNOSIS — R634 Abnormal weight loss: Secondary | ICD-10-CM | POA: Insufficient documentation

## 2023-11-01 DIAGNOSIS — C9110 Chronic lymphocytic leukemia of B-cell type not having achieved remission: Secondary | ICD-10-CM

## 2023-11-01 DIAGNOSIS — D509 Iron deficiency anemia, unspecified: Secondary | ICD-10-CM | POA: Insufficient documentation

## 2023-11-01 DIAGNOSIS — D709 Neutropenia, unspecified: Secondary | ICD-10-CM | POA: Insufficient documentation

## 2023-11-01 DIAGNOSIS — Z8701 Personal history of pneumonia (recurrent): Secondary | ICD-10-CM | POA: Insufficient documentation

## 2023-11-01 LAB — COMPREHENSIVE METABOLIC PANEL WITH GFR
ALT: 20 U/L (ref 0–44)
AST: 36 U/L (ref 15–41)
Albumin: 3.8 g/dL (ref 3.5–5.0)
Alkaline Phosphatase: 76 U/L (ref 38–126)
Anion gap: 9 (ref 5–15)
BUN: 15 mg/dL (ref 8–23)
CO2: 24 mmol/L (ref 22–32)
Calcium: 8.8 mg/dL — ABNORMAL LOW (ref 8.9–10.3)
Chloride: 106 mmol/L (ref 98–111)
Creatinine, Ser: 1.1 mg/dL (ref 0.61–1.24)
GFR, Estimated: >60 mL/min (ref >60)
Glucose, Bld: 109 mg/dL — ABNORMAL HIGH (ref 70–99)
Potassium: 4 mmol/L (ref 3.5–5.1)
Sodium: 139 mmol/L (ref 135–145)
Total Bilirubin: 0.9 mg/dL (ref 0.0–1.2)
Total Protein: 6.9 g/dL (ref 6.5–8.1)

## 2023-11-01 LAB — CBC WITH DIFFERENTIAL (CANCER CENTER ONLY)
Basophils Absolute: 0 K/uL (ref 0.0–0.1)
Basophils Relative: 0 %
Eosinophils Absolute: 0.2 K/uL (ref 0.0–0.5)
Eosinophils Relative: 3 %
HCT: 31.3 % — ABNORMAL LOW (ref 39.0–52.0)
Hemoglobin: 9.8 g/dL — ABNORMAL LOW (ref 13.0–17.0)
Lymphocytes Relative: 84 %
Lymphs Abs: 4.7 K/uL — ABNORMAL HIGH (ref 0.7–4.0)
MCH: 28.3 pg (ref 26.0–34.0)
MCHC: 31.3 g/dL (ref 30.0–36.0)
MCV: 90.5 fL (ref 80.0–100.0)
Monocytes Absolute: 0.4 K/uL (ref 0.1–1.0)
Monocytes Relative: 8 %
Neutro Abs: 0.3 K/uL — CL (ref 1.7–7.7)
Neutrophils Relative %: 5 %
Platelet Count: 173 K/uL (ref 150–400)
RBC: 3.46 MIL/uL — ABNORMAL LOW (ref 4.22–5.81)
RDW: 17.7 % — ABNORMAL HIGH (ref 11.5–15.5)
WBC Count: 5.6 K/uL (ref 4.0–10.5)
nRBC: 0 % (ref 0.0–0.2)

## 2023-11-01 LAB — FOLATE: Folate: 13.6 ng/mL (ref >5.9)

## 2023-11-01 LAB — RETIC PANEL
Immature Retic Fract: 21.6 % — ABNORMAL HIGH (ref 2.3–15.9)
RBC.: 3.39 MIL/uL — ABNORMAL LOW (ref 4.22–5.81)
Retic Count, Absolute: 60.3 K/uL (ref 19.0–186.0)
Retic Ct Pct: 1.8 % (ref 0.4–3.1)
Reticulocyte Hemoglobin: 27 pg — ABNORMAL LOW (ref >27.9)

## 2023-11-01 LAB — HEPATITIS PANEL, ACUTE
HCV Ab: NONREACTIVE
Hep A IgM: NONREACTIVE
Hep B C IgM: NONREACTIVE
Hepatitis B Surface Ag: NONREACTIVE

## 2023-11-01 LAB — LACTATE DEHYDROGENASE: LDH: 285 U/L — ABNORMAL HIGH (ref 98–192)

## 2023-11-01 LAB — VITAMIN B12: Vitamin B-12: >4000 pg/mL — ABNORMAL HIGH (ref 180–914)

## 2023-11-01 LAB — HIV ANTIBODY (ROUTINE TESTING W REFLEX): HIV Screen 4th Generation wRfx: NONREACTIVE

## 2023-11-01 NOTE — Assessment & Plan Note (Signed)
 History of pneumonia.  Former smoker.  Obtain CT chest without contrast

## 2023-11-01 NOTE — Progress Notes (Addendum)
 Hematology/Oncology Progress note [phone] [phone]        ASSESSMENT & PLAN:   Cancer Staging  CLL (chronic lymphocytic leukemia) (HCC) Staging form: Chronic Lymphocytic Leukemia / Small Lymphocytic Lymphoma, AJCC 8th Edition - Clinical stage from 12/28/2021: Modified Rai Stage III (Modified Rai risk: High, Lymphocytosis: Present, Adenopathy: Present, Organomegaly: Absent, Anemia: Present, Thrombocytopenia: Absent) - Signed by Babara Call, MD on 01/20/2022   CLL (chronic lymphocytic leukemia) [facility]) Labs are reviewed and discussed with patient. CT chest abdomen pelvis results reviewed and discussed with patient.   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  50% of bone marrow is involved with CLL.  Labs are reviewed and discussed with patient.  Hold Zanubrutinib  160 mg twice daily due to neutropenia  Neutropenia Low neutrophil counts. Check B12, folate, hepatitis, HIV.  Neutropenia precaution Hold off resuming CLL treatments.  Cough History of pneumonia.  Former smoker.  Obtain CT chest without contrast  Unintentional weight loss Unclear etiology. CLL has responded to treatments and currently on hold. Previous CT abdomen pelvis showed no lymphadenopathy.  CT chest is pending. Possibly secondary to decreased oral intake. Recommend patient to follow-up with nutritionist Orders Placed This Encounter  Procedures   CT Chest Wo Contrast    Standing Status:   Future    Expected Date:   11/03/2023    Expiration Date:   10/31/2024    Preferred imaging location?:   [facility]   Vitamin B12    Standing Status:   Future    Number of Occurrences:   1    Expected Date:   11/01/2023    Expiration Date:   10/31/2024   Folate    Standing Status:   Future    Number of Occurrences:   1    Expected Date:   11/01/2023    Expiration Date:   10/31/2024   Comprehensive  metabolic panel with GFR    Standing Status:   Future    Number of Occurrences:   1    Expected Date:   11/01/2023    Expiration Date:   10/31/2024   Lactate dehydrogenase    Standing Status:   Future    Number of Occurrences:   1    Expected Date:   11/01/2023    Expiration Date:   10/31/2024   HIV Antibody (routine testing w rflx)    Standing Status:   Future    Number of Occurrences:   1    Expected Date:   11/01/2023    Expiration Date:   10/31/2024   Hepatitis panel, acute    Standing Status:   Future    Number of Occurrences:   1    Expected Date:   11/01/2023    Expiration Date:   10/31/2024   Follow-up to be determined. All questions were answered. The patient knows to call the clinic with any problems, questions or concerns.  Call Babara, MD, PhD [facility] Hematology Oncology 11/01/2023    CHIEF COMPLAINTS/PURPOSE OF CONSULTATION:  CLL  HISTORY OF PRESENTING ILLNESS:  Paul Nadara Browner [name] 78 y.o. male presents for follow up of CLL I have reviewed his chart and materials related to his cancer extensively and collaborated history with the patient. Summary of oncologic history is as follows: Oncology History  CLL (chronic lymphocytic leukemia) (HCC)  12/25/2021 - 12/26/2021 Hospital Admission   Patient was admitted due to pain and swelling of  right ankle, right knee and left wrist.  Patient was found to have WBC 30.4, with neutrophil 29% and lymphocyte 2%.  Peripheral smear showed leukocytosis with slight left shift in myeloid series and lymphocytosis with abnormal morphology.  Uric acid 5.5, worsening renal function with creatinine 1.35, BUN 18, GFR 55 (baseline creatinine 1.08 on 07/06/2021), lactic acid 1.1.  ESR normal, uric acid 5.5 normal, CRP 22.2, LDH 182  Orthopedic surgery was consulted s/p arthrocentesis, cell count 296 with 72% neutrophil, no crystal. Fluid negative for growth.  Patient received cefepime  and vancomycin  while in the hospital, Patient was discharged  on Keflex  for 5 days.   12/28/2021 Cancer Staging   Staging form: Chronic Lymphocytic Leukemia / Small Lymphocytic Lymphoma, AJCC 8th Edition - Clinical stage from 12/28/2021: Modified Rai Stage III (Modified Rai risk: High, Lymphocytosis: Present, Adenopathy: Present, Organomegaly: Absent, Anemia: Present, Thrombocytopenia: Absent) - Signed by Babara Call, MD on 01/20/2022 Stage prefix: Initial diagnosis Hemoglobin (Hgb) (g/dL): 89.8   87/01/7974 Initial Diagnosis   CLL (chronic lymphocytic leukemia)   12/27/21 peripheral blood flowcytometry showed Involvement by CD5+, CD23+, CD20+, CD22+ clonal B cell population, phenotype typical for 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    01/05/2022 Imaging   CT chest abdomen pelvis wo contrast 1. Multiple prominent borderline enlarged and mildly enlarged lymph nodes, most evident in the low anatomic pelvis, as above, compatible with reported clinical history of CLL. 2. There also several small pulmonary nodules in the lungs measuring 5 mm or less in size. This is nonspecific, but statistically likely benign. No follow-up needed if patient is low-risk (and has no known or suspected primary neoplasm). Non-contrast chest CT can be considered in 12 months if patient is high-risk. This recommendation 3. Aortic atherosclerosis, in addition to left main and 2 vessel coronary artery disease. Please note that although the presence of coronary artery calcium  documents the presence of coronary artery disease, the severity of this disease and any potential stenosis cannot be assessed on this non-gated CT examination. Assessment for  potential risk factor modification, dietary therapy or pharmacologic therapy may be warranted, if clinically indicated. 4. There are calcifications of the aortic valve.  Echocardiographic correlation for evaluation of potential valvular dysfunction may be warranted if clinically indicated. 5. 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 6. Diverticulosis without evidence of acute diverticulitis at this time. 7. Mild cardiomegaly.   08/26/2022 Imaging   CT abdomen pelvis w contrast  1. Subcutaneous fat stranding within the left lower quadrant anterior abdominal wall extending into the anterior proximal left thigh, likely related to contusion given history of recent trauma. No fluid collection or hematoma. 2. No acute displaced fracture. 3. Retroperitoneal and pelvic lymphadenopathy as above, with waxing and waning appearance. 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 4. 1.9 cm hypodensity within the medial aspect of the spleen,nonspecific. Leukemic involvement of the spleen cannot be excluded. 5. Distal colonic diverticulosis without diverticulitis. 6.  Aortic Atherosclerosis    07/24/2023 Imaging   MRI cervical spine with and without contrast  1. Enhancing and indeterminate right C4 posterior element/facet bone lesion, which is superimposed on diffusely abnormal but nonspecific generalized decreased T1 marrow signal in the visible spine and at the skull base.   Marrow infiltrative processes such as myelofibrosis or lymphoma cannot be excluded. But the absence of other suspicious marrow edema or enhancement argues  against metastatic disease or multiple myeloma. And furthermore similar generalized abnormal marrow signal can be caused by chronic anemia, smoking, obesity.   2. Underlying severe chronic cervical spine degeneration with reversal of lordosis and multifactorial degenerative cervical spinal stenosis at all levels. Associated moderate spinal stenosis and moderate (up to severe) spinal cord mass effect C3-C4 through C5-C6. No associated spinal cord edema or myelomalacia. Associated moderate or severe degenerative neural  foraminal stenosis at the bilateral C4 through C8 nerve levels.       08/19/2023 Bone Marrow Biopsy   Bone marrow biopsy  aspirate clot core  - Hypercellular bone marrow (80%) involved by the patient's known  chronic lymphocytic leukemia at approximately 50% of the cellular  marrow.      08/23/2022 Colonoscopy showed - Likely malignant partially obstructing tumor in the distal ascending colon. Biopsied. Tattooed. - Three 4 to 6 mm polyps in the transverse colon and in the ascending colon, removed with a cold snare. Resected and retrieved. - Five 3 to 7 mm polyps in the rectum, in the descending colon, in the transverse colon and in the ascending colon, removed with a hot snare. Resected and retrieved. 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 - Non- bleeding internal hemorrhoids.  Pathology showed 1. Ascending Colon Polyp, x2 cold snare - TUBULAR ADENOMA(S) (MULTIPLE FRAGMENTS) - NEGATIVE FOR HIGH-GRADE DYSPLASIA OR MALIGNANCY 2. Ascending Colon Polyp, hot snare - TUBULAR ADENOMA (MULTIPLE FRAGMENTS) - NEGATIVE FOR HIGH-GRADE DYSPLASIA OR MALIGNANCY 3. Ascending Colon Biopsy, mass cbx - TUBULAR ADENOMA (MULTIPLE FRAGMENTS) - SEE NOTE 4. Transverse Colon Polyp, hot snare; cold snare - TUBULAR ADENOMA (MULTIPLE FRAGMENTS) - NEGATIVE FOR HIGH-GRADE DYSPLASIA OR MALIGNANCY 5. Descending Colon Polyp, x2 hot snare - TUBULAR ADENOMA(S) (MULTIPLE FRAGMENTS) - NEGATIVE FOR HIGH-GRADE DYSPLASIA OR MALIGNANCY 6. Rectum, polyp(s), hot snare - TUBULOVILLOUS ADENOMA (MULTIPLE FRAGMENTS) - NEGATIVE FOR HIGH-GRADE DYSPLASIA OR MALIGNANCY I&D Recent admission due to sepsis due to cellulitis, left groin abscess, s/p I&D  04/24/2023 s/p colonoscopy, removal of small polyps.  04/26/2023 CT chest wo contrast  Pulmonary infiltrates as described and left pleural effusion correlate with bronchopneumonia. Follow-up recommended as clinically needed. Small pericardial effusion, correlation  with echocardiography recommended as clinically needed.  04/27/2023 CT chest PE angiogram  No definite evidence of pulmonary embolus. Small left pleural effusion is noted. Airspace opacities are noted in lingular segment of left upper lobe as well as in left lower lobe most consistent with pneumonia. Minimal pericardial effusion. Aortic Atherosclerosis   Recent hospitalization due to right knee pain due to septic prepatella bursitis of right knee. He was treated wit IV abx. S/p FNA aspiration, culture was negative.   INTERVAL HISTORY Blayze Haen [name] is a 78 y.o. male who has above history reviewed by me today presents for follow up visit for CLL, iron  deficiency anemia, colon tubular adenoma with high-grade dysplastic status post resection. Patient finishes treatments for cellulitis.  Today he reports feeling well.  He mentioned that last week he was not feeling well.  No fever no chills. Patient is currently off Zanubrutinib . He has had weight loss.  He met with dietitian who recommended nutrition supplements.  Patient reports that he does not like the supplements.  He does physical work and only eat 1-2 times per day.  Patient denies nausea vomiting diarrhea.   MEDICAL HISTORY:  Past Medical History:  Diagnosis Date   Adenoma of left adrenal gland    Anemia    Aortic atherosclerosis    Blood transfusion without  reported diagnosis    BPH (benign prostatic hyperplasia)    Bradycardia    CAD (coronary artery disease) 08/26/2022   a.) cCTA 08/26/2022: Ca2+ 14.3 (12th %'ile for age/sex.race match control); (<25%) pLAD   Cellulitis of left thigh 08/2022   Chronic pain syndrome    a.) on COT managed by pain management   Chronic, continuous use of opioids    a.) chronic pain syndrome/chronic back pain; managed by pain management   CKD (chronic kidney disease) stage 3, GFR 30-59 ml/min (HCC)    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   a.) Rai stage III   DDD  (degenerative disc disease), cervical    Difficult airway 10/22/2015   a.) 1st attempt with glidescope  --> macroglossic (unsuccessful); b.) 2nd attempt with Mac 4 and direct laryngoscopy with a 8.0 ETT --> tube too large to pass through the cords; b.) 3rd attempt (successful) with a Mac 4 and a 7.5 tube   Diverticulosis    DM (diabetes mellitus), type 2 (HCC)    Dysplasia of prostate    Erectile dysfunction    Frequent falls    Heart failure with mildly reduced ejection fraction (HFmrEF) (HCC)    a.) TTE 10/23/2015: EF 60-65%, mod-sev LVH, mild biatrial dil, degen MV disease, AoV sclerosis, asc Ao 38 mm, G1DD; b.) TTE 08/05/2022: EF 45-50%, mod-sev LVH (speckled pattern), sev biatrial dil, mild MR, AoV sclerosis, G1DD   Hepatic flexure mass 08/23/2022   a.) colonoscopy 08/23/2022: 14 x 15 mm partially obstructing ulcerated distal ascending colon mass; tissue friable --> pathology resulted as tubular adenoma, however felt to be false (-) --> referral to sugery and colectomy recommended; b.) s/p RIGHT hemicolectomy 09/28/2022   History of MRSA infection 08/28/2022   a.) MRSA PCR (+) 08/28/2022; culture from LEFT thigh abscess   HTN (hypertension)    Hyperlipidemia    Hypogonadism in male    Hypothyroidism    IDA (iron  deficiency anemia)    LBBB (left bundle branch block)    Long-term use of aspirin  therapy    Lumbar spinal stenosis    Mild cardiomegaly    Multiple lung nodules on CT    NICM (nonischemic cardiomyopathy) (HCC)    a.) TTE 10/23/2015: EF 60-65; b.) TTE 08/05/2022: EF 45-50%   Orchitis and epididymitis 06/16/2013   OSA on CPAP    Palindromic rheumatism, hand    Pericardial effusion    Prostatitis    Sepsis (HCC) 08/2022   Subdural hematoma (HCC) 10/22/2015   a.) s/p traumatic mechanical fall --> CT head 10/22/2015: high-density SDH the left cerebral convexity with13 mm midline shift --> s/p LEFT frontal burr hole craniotomy   Tubular adenoma of colon    Ventral hernia      SURGICAL HISTORY: Past Surgical History:  Procedure Laterality Date   BACK SURGERY     BIOPSY  08/21/2022   Procedure: BIOPSY;  Surgeon: Onita Elspeth Sharper, DO;  Location: [facility] ENDOSCOPY;  Service: Gastroenterology;;   BIOPSY  08/23/2022   Procedure: BIOPSY;  Surgeon: Onita Elspeth Sharper, DO;  Location: [facility] ENDOSCOPY;  Service: Gastroenterology;;   BIOPSY  02/28/2023   Procedure: BIOPSY;  Surgeon: Onita Elspeth Sharper, DO;  Location: [facility] ENDOSCOPY;  Service: Gastroenterology;;   SOLMON DAKIN OF CRANIUM Left 10/22/2015   COLON SURGERY  09/28/2022   COLONOSCOPY WITH PROPOFOL  N/A 08/23/2022   Procedure: COLONOSCOPY WITH PROPOFOL ;  Surgeon: Onita Elspeth Sharper, DO;  Location: [facility] ENDOSCOPY;  Service: Gastroenterology;  Laterality: N/A;   COLONOSCOPY  WITH PROPOFOL  N/A 02/28/2023   Procedure: COLONOSCOPY WITH PROPOFOL ;  Surgeon: Onita Elspeth Sharper, DO;  Location: [facility] ENDOSCOPY;  Service: Gastroenterology;  Laterality: N/A;  DM   COLONOSCOPY WITH PROPOFOL  N/A 04/25/2023   Procedure: COLONOSCOPY WITH PROPOFOL ;  Surgeon: Onita Elspeth Sharper, DO;  Location: [facility] ENDOSCOPY;  Service: Gastroenterology;  Laterality: N/A;  DM   ESOPHAGOGASTRODUODENOSCOPY (EGD) WITH PROPOFOL  N/A 08/21/2022   Procedure: ESOPHAGOGASTRODUODENOSCOPY (EGD) WITH PROPOFOL ;  Surgeon: Onita Elspeth Sharper, DO;  Location: [facility] ENDOSCOPY;  Service: Gastroenterology;  Laterality: N/A;   ESOPHAGOGASTRODUODENOSCOPY (EGD) WITH PROPOFOL  N/A 02/28/2023   Procedure: ESOPHAGOGASTRODUODENOSCOPY (EGD) WITH PROPOFOL ;  Surgeon: Onita Elspeth Sharper, DO;  Location: [facility] ENDOSCOPY;  Service: Gastroenterology;  Laterality: N/A;   GIVENS CAPSULE STUDY N/A 08/23/2022   Procedure: GIVENS CAPSULE STUDY;  Surgeon: Onita Elspeth Sharper, DO;  Location: [facility] ENDOSCOPY;  Service: Gastroenterology;  Laterality: N/A;   HERNIA REPAIR     INGUINAL HERNIA REPAIR Right 03/09/2023   Procedure: HERNIA REPAIR INGUINAL ADULT, open, RNFA to  assist;  Surgeon: Jordis Laneta FALCON, MD;  Location: [facility] ORS;  Service: General;  Laterality: Right;   IR BONE MARROW BIOPSY & ASPIRATION  08/19/2023   IR US  GUIDE BX ASP/DRAIN  06/27/2023   IRRIGATION AND DEBRIDEMENT ABSCESS Left 08/28/2022   Procedure: IRRIGATION AND DEBRIDEMENT ABSCESS LEFT UPPER THIGH/GROIN;  Surgeon: Jordis Laneta FALCON, MD;  Location: [facility] ORS;  Service: General;  Laterality: Left;   LAPAROSCOPIC RIGHT COLECTOMY Right 09/28/2022   Procedure: LAPAROSCOPIC RIGHT COLECTOMY, RNFA to assist;  Surgeon: Jordis Laneta FALCON, MD;  Location: [facility] ORS;  Service: General;  Laterality: Right;   POLYPECTOMY  08/23/2022   Procedure: POLYPECTOMY;  Surgeon: Onita Elspeth Sharper, DO;  Location: [facility] ENDOSCOPY;  Service: Gastroenterology;;   POLYPECTOMY  04/25/2023   Procedure: POLYPECTOMY;  Surgeon: Onita Elspeth Sharper, DO;  Location: [facility] ENDOSCOPY;  Service: Gastroenterology;;   SUBMUCOSAL TATTOO INJECTION  08/23/2022   Procedure: SUBMUCOSAL TATTOO INJECTION;  Surgeon: Onita Elspeth Sharper, DO;  Location: [facility] ENDOSCOPY;  Service: Gastroenterology;;   TONSILLECTOMY     VENTRAL HERNIA REPAIR N/A 09/28/2022   Procedure: HERNIA REPAIR VENTRAL ADULT;  Surgeon: Jordis Laneta FALCON, MD;  Location: [facility] ORS;  Service: General;  Laterality: N/A;    SOCIAL HISTORY: Social History   Socioeconomic History   Marital status: Married    Spouse name: Not on file   Number of children: Not on file   Years of education: Not on file   Highest education level: 5th grade  Occupational History   Not on file  Tobacco Use   Smoking status: Former    Types: Cigarettes    Passive exposure: Past   Smokeless tobacco: Never   Tobacco comments:    Stop smoking iver 40  plus years ago  Vaping Use   Vaping status: Never Used  Substance and Sexual Activity   Alcohol use: No   Drug use: No   Sexual activity: Not Currently  Other Topics Concern   Not on file  Social History Narrative   Not on file   Social Drivers  of Health   Financial Resource Strain: Patient Declined (04/26/2023)   Received from [facility]   Overall Financial Resource Strain (CARDIA)    Difficulty of Paying Living Expenses: Patient declined  Recent Concern: Financial Resource Strain - Medium Risk (02/24/2023)   Received from [facility]   Overall Financial Resource Strain (CARDIA)    Difficulty of Paying Living Expenses: Somewhat hard  Food  Insecurity: No Food Insecurity (06/23/2023)   Hunger Vital Sign    Worried About Running Out of Food in the Last Year: Never true    Ran Out of Food in the Last Year: Never true  Transportation Needs: Patient Declined (08/07/2023)   Received from [facility] - Transportation    In the past 12 months, has lack of transportation kept you from medical appointments or from getting medications?: Patient declined    Lack of Transportation (Non-Medical): Patient declined  Physical Activity: Unknown (07/26/2022)   Exercise Vital Sign    Days of Exercise per Week: Patient declined    Minutes of Exercise per Session: Not on file  Stress: Stress Concern Present (07/26/2022)   [employer] of Occupational Health - Occupational Stress Questionnaire    Feeling of Stress : To some extent  Social Connections: Socially Integrated (06/23/2023)   Social Connection and Isolation Panel    Frequency of Communication with Friends and Family: More than three times a week    Frequency of Social Gatherings with Friends and Family: Patient declined    Attends Religious Services: More than 4 times per year    Active Member of [employer] or Organizations: Yes    Attends Banker Meetings: Never    Marital Status: Married  Catering manager Violence: Not At Risk (06/23/2023)   Humiliation, Afraid, Rape, and Kick questionnaire    Fear of Current or Ex-Partner: No    Emotionally Abused: No    Physically Abused: No    Sexually Abused: No    FAMILY  HISTORY: Family History  Problem Relation Age of Onset   Cancer Maternal Aunt    ADD / ADHD Daughter    Asthma Son    Prostate cancer Neg Hx    Kidney disease Neg Hx    Kidney cancer Neg Hx    Bladder Cancer Neg Hx     ALLERGIES:  has no known allergies.  MEDICATIONS:  Current Outpatient Medications  Medication Sig Dispense Refill   acetaminophen  (TYLENOL ) 325 MG tablet Take 2 tablets (650 mg total) by mouth every 6 (six) hours as needed for mild pain (pain score 1-3) or fever.     albuterol  (VENTOLIN  HFA) 108 (90 Base) MCG/ACT inhaler Inhale 2 puffs into the lungs every 4 (four) hours as needed for wheezing or shortness of breath.     aspirin  EC 81 MG tablet Take 1 tablet (81 mg total) by mouth daily. Swallow whole.     empagliflozin  (JARDIANCE ) 10 MG TABS tablet Take 1 tablet (10 mg total) by mouth daily before breakfast. 90 tablet 2   fluticasone -salmeterol (ADVAIR) 250-50 MCG/ACT AEPB Inhale 1 puff into the lungs in the morning and at bedtime.     furosemide  (LASIX ) 20 MG tablet Take 20 mg by mouth daily as needed.     hydrALAZINE  (APRESOLINE ) 50 MG tablet Take 2 tablets (100 mg total) by mouth 2 (two) times daily. 180 tablet 3   ipratropium (ATROVENT) 0.03 % nasal spray Place 2 sprays into the nose 2 (two) times daily.     levothyroxine  (SYNTHROID ) 200 MCG tablet Take 1 tablet (200 mcg total) by mouth daily before breakfast. 90 tablet 0   metFORMIN  (GLUCOPHAGE ) 500 MG tablet Take 2 tablets (1,000 mg total) by mouth 2 (two) times daily with a meal. 120 tablet 0   metoprolol  succinate (TOPROL  XL) 25 MG 24 hr tablet Take 1 tablet (25 mg total) by mouth daily. 90 tablet  1   oxyCODONE -acetaminophen  (PERCOCET) 10-325 MG tablet Take 1 tablet by mouth every 8 (eight) hours as needed for pain. Must last 30 days. 90 tablet 0   [START ON 11/16/2023] oxyCODONE -acetaminophen  (PERCOCET) 10-325 MG tablet Take 1 tablet by mouth every 8 (eight) hours as needed for pain. Must last 30 days. 90 tablet  0   rosuvastatin  (CRESTOR ) 40 MG tablet Take 1 tablet (40 mg total) by mouth daily. 90 tablet 1   sacubitril -valsartan  (ENTRESTO ) 97-103 MG Take 1 tablet by mouth 2 (two) times daily. 120 tablet 3   spironolactone  (ALDACTONE ) 25 MG tablet Take 0.5 tablets (12.5 mg total) by mouth daily. 45 tablet 3   zanubrutinib  (BRUKINSA ) 80 MG capsule Take 2 capsules (160 mg total) by mouth 2 (two) times daily. 120 capsule 2   No current facility-administered medications for this visit.    Review of Systems  Constitutional:  Positive for fatigue. Negative for appetite change, chills, fever and unexpected weight change.  HENT:   Negative for hearing loss and voice change.   Eyes:  Negative for eye problems and icterus.  Respiratory:  Negative for chest tightness, cough and shortness of breath.   Cardiovascular:  Negative for chest pain and leg swelling.  Gastrointestinal:  Negative for abdominal distention and abdominal pain.  Endocrine: Negative for hot flashes.  Genitourinary:  Negative for difficulty urinating, dysuria and frequency.   Musculoskeletal:  Positive for arthralgias.  Skin:  Negative for itching and rash.  Neurological:  Negative for light-headedness and numbness.  Hematological:  Negative for adenopathy. Does not bruise/bleed easily.  Psychiatric/Behavioral:  Negative for confusion.      PHYSICAL EXAMINATION: ECOG PERFORMANCE STATUS: 1 - Symptomatic but completely ambulatory  Vitals:   11/01/23 1314  BP: 135/79  Pulse: 71  Resp: 16  Temp: (!) 97.1 F (36.2 C)  SpO2: 98%   Filed Weights   11/01/23 1314  Weight: 209 lb 11.2 oz (95.1 kg)    Physical Exam Constitutional:      General: He is not in acute distress.    Appearance: He is obese. He is not diaphoretic.  HENT:     Head: Normocephalic and atraumatic.  Eyes:     General: No scleral icterus.    Pupils: Pupils are equal, round, and reactive to light.  Cardiovascular:     Rate and Rhythm: Normal rate.     Heart  sounds: No murmur heard. Pulmonary:     Effort: Pulmonary effort is normal. No respiratory distress.     Breath sounds: No wheezing.  Abdominal:     General: There is no distension.     Palpations: Abdomen is soft.  Genitourinary:    Comments:   Musculoskeletal:     Cervical back: Normal range of motion and neck supple.  Skin:    General: Skin is warm and dry.     Findings: No erythema.     Comments: Skin boil on trunk Right lower extremity erythematous changes and swelling.   Neurological:     Mental Status: He is alert and oriented to person, place, and time. Mental status is at baseline.     Cranial Nerves: No cranial nerve deficit.     Motor: No abnormal muscle tone.  Psychiatric:        Mood and Affect: Mood and affect normal.        LABORATORY DATA:  I have reviewed the data as listed    Latest Ref Rng & Units 11/01/2023   12:53  PM 10/03/2023    1:35 PM 10/01/2023    1:29 AM  CBC  WBC 4.0 - 10.5 K/uL 5.6  11.6  12.3   Hemoglobin 13.0 - 17.0 g/dL 9.8  9.1  9.1   Hematocrit 39.0 - 52.0 % 31.3  29.6  29.6   Platelets 150 - 400 K/uL 173  152  164       Latest Ref Rng & Units 11/01/2023    2:10 PM 10/03/2023    1:35 PM 10/01/2023    1:29 AM  CMP  Glucose 70 - 99 mg/dL 890  88  893   BUN 8 - 23 mg/dL 15  14  17    Creatinine 0.61 - 1.24 mg/dL 8.89  8.78  8.74   Sodium 135 - 145 mmol/L 139  136  141   Potassium 3.5 - 5.1 mmol/L 4.0  4.3  3.9   Chloride 98 - 111 mmol/L 106  105  108   CO2 22 - 32 mmol/L 24  24  26    Calcium  8.9 - 10.3 mg/dL 8.8  8.2  9.0   Total Protein 6.5 - 8.1 g/dL 6.9  6.5  6.8   Total Bilirubin 0.0 - 1.2 mg/dL 0.9  0.8  0.7   Alkaline Phos 38 - 126 U/L 76  67  66   AST 15 - 41 U/L 36  26  33   ALT 0 - 44 U/L 20  16  20       RADIOGRAPHIC STUDIES: I have personally reviewed the radiological images as listed and agreed with the findings in the report. No results found.

## 2023-11-01 NOTE — Assessment & Plan Note (Signed)
 Unclear etiology. CLL has responded to treatments and currently on hold. Previous CT abdomen pelvis showed no lymphadenopathy.  CT chest is pending. Possibly secondary to decreased oral intake. Recommend patient to follow-up with nutritionist

## 2023-11-01 NOTE — Assessment & Plan Note (Addendum)
 Labs are reviewed and discussed with patient. CT chest abdomen pelvis results reviewed and discussed with patient.   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  50% of bone marrow is involved with CLL.  Labs are reviewed and discussed with patient.  Hold Zanubrutinib  160 mg twice daily due to neutropenia

## 2023-11-01 NOTE — Telephone Encounter (Signed)
 Called pt to schedule CT - left vm - LH

## 2023-11-01 NOTE — Assessment & Plan Note (Addendum)
 Low neutrophil counts. Check B12, folate, hepatitis, HIV.  Neutropenia precaution Hold off resuming CLL treatments.

## 2023-11-01 NOTE — Telephone Encounter (Signed)
 11/01/2023- 1317-Critical Anc 0.3 reported by Cyndee in cancer center lab. Read back process performed tech.  1319-Dr. Babara informed of critical anc. Read back process performed with provider.

## 2023-11-02 ENCOUNTER — Other Ambulatory Visit: Payer: Self-pay

## 2023-11-03 ENCOUNTER — Telehealth: Payer: Self-pay | Admitting: Oncology

## 2023-11-03 NOTE — Telephone Encounter (Signed)
 Called pt to schedule CT - no answer - left vm - LH

## 2023-11-07 ENCOUNTER — Ambulatory Visit: Payer: Self-pay | Admitting: Oncology

## 2023-11-07 ENCOUNTER — Encounter: Payer: Self-pay | Admitting: Oncology

## 2023-11-08 ENCOUNTER — Inpatient Hospital Stay

## 2023-11-08 ENCOUNTER — Other Ambulatory Visit: Payer: Self-pay | Admitting: Oncology

## 2023-11-08 NOTE — Progress Notes (Signed)
 Checking with auth dept

## 2023-11-08 NOTE — Progress Notes (Signed)
 Nutrition  Patient did not show up for nutrition appointment today.  Message sent to scheduling to offer another appointment.   Kyon Bentler B. Dasie SOLON, CSO, LDN Registered Dietitian [phone]

## 2023-11-09 ENCOUNTER — Ambulatory Visit
Admission: RE | Admit: 2023-11-09 | Discharge: 2023-11-09 | Disposition: A | Discharge status: Home / Self Care | Source: Ambulatory Visit | Attending: Oncology | Admitting: Oncology

## 2023-11-09 DIAGNOSIS — R059 Cough, unspecified: Secondary | ICD-10-CM | POA: Insufficient documentation

## 2023-11-09 DIAGNOSIS — R590 Localized enlarged lymph nodes: Secondary | ICD-10-CM | POA: Insufficient documentation

## 2023-11-09 DIAGNOSIS — I517 Cardiomegaly: Secondary | ICD-10-CM | POA: Insufficient documentation

## 2023-11-09 DIAGNOSIS — R918 Other nonspecific abnormal finding of lung field: Secondary | ICD-10-CM | POA: Diagnosis not present

## 2023-11-10 ENCOUNTER — Other Ambulatory Visit: Payer: Self-pay

## 2023-11-10 ENCOUNTER — Inpatient Hospital Stay

## 2023-11-10 ENCOUNTER — Inpatient Hospital Stay: Admitting: Hospice and Palliative Medicine

## 2023-11-10 ENCOUNTER — Encounter: Payer: Self-pay | Admitting: Hospice and Palliative Medicine

## 2023-11-10 DIAGNOSIS — C9110 Chronic lymphocytic leukemia of B-cell type not having achieved remission: Secondary | ICD-10-CM | POA: Diagnosis not present

## 2023-11-10 LAB — CBC WITH DIFFERENTIAL (CANCER CENTER ONLY)
Abs Immature Granulocytes: 0.18 K/uL — ABNORMAL HIGH (ref 0.00–0.07)
Basophils Absolute: 0.1 K/uL (ref 0.0–0.1)
Basophils Relative: 1 %
Eosinophils Absolute: 0.2 K/uL (ref 0.0–0.5)
Eosinophils Relative: 1 %
HCT: 31.2 % — ABNORMAL LOW (ref 39.0–52.0)
Hemoglobin: 9.9 g/dL — ABNORMAL LOW (ref 13.0–17.0)
Immature Granulocytes: 1 %
Lymphocytes Relative: 43 %
Lymphs Abs: 5.5 K/uL — ABNORMAL HIGH (ref 0.7–4.0)
MCH: 28.4 pg (ref 26.0–34.0)
MCHC: 31.7 g/dL (ref 30.0–36.0)
MCV: 89.4 fL (ref 80.0–100.0)
Monocytes Absolute: 1.3 K/uL — ABNORMAL HIGH (ref 0.1–1.0)
Monocytes Relative: 10 %
Neutro Abs: 5.4 K/uL (ref 1.7–7.7)
Neutrophils Relative %: 44 %
Platelet Count: 276 K/uL (ref 150–400)
RBC: 3.49 MIL/uL — ABNORMAL LOW (ref 4.22–5.81)
RDW: 17.9 % — ABNORMAL HIGH (ref 11.5–15.5)
Smear Review: NORMAL
WBC Count: 12.6 K/uL — ABNORMAL HIGH (ref 4.0–10.5)
nRBC: 0.2 % (ref 0.0–0.2)

## 2023-11-10 LAB — SAMPLE TO BLOOD BANK

## 2023-11-10 MED ORDER — AMOXICILLIN-POT CLAVULANATE 875-125 MG PO TABS: 1.0000 | ORAL_TABLET | Freq: Two times a day (BID) | ORAL | 0 refills | Status: DC

## 2023-11-10 NOTE — Progress Notes (Signed)
 Symptom Management Clinic [facility] [phone] [phone]) [phone]  Patient Care Team: Cyrus Selinda Moose, PA-C as PCP - General (Family Medicine) Darliss Rogue, MD as PCP - Cardiology (Cardiology) Babara Call, MD as Consulting Physician (Oncology)   NAME OF PATIENT: Paul Briggs  [id number]  1945/09/29   DATE OF VISIT: 11/10/23  REASON FOR CONSULT: Paul Nadara Browner [name] is a 78 y.o. male with multiple medical problems including CLL patient was on treatment Zanubrutinib  but this was followed due to neutropenia.  INTERVAL HISTORY: Patient presents [facility] today with complaint of cough, congestion, possible fevers over the past 4 days.  He had CT of the chest yesterday with concern for possible pneumonia.  Patient says he is not taking any of his inhalers for COPD.  Denies any neurologic complaints. Denies any easy bleeding or bruising. Reports good appetite and denies weight loss. Denies chest pain. Denies any nausea, vomiting, constipation, or diarrhea. Denies urinary complaints. Patient offers no further specific complaints today.   PAST MEDICAL HISTORY: Past Medical History:  Diagnosis Date   Adenoma of left adrenal gland    Anemia    Aortic atherosclerosis    Blood transfusion without reported diagnosis    BPH (benign prostatic hyperplasia)    Bradycardia    CAD (coronary artery disease) 08/26/2022   a.) cCTA 08/26/2022: Ca2+ 14.3 (12th %'ile for age/sex.race match control); (<25%) pLAD   Cellulitis of left thigh 08/2022   Chronic pain syndrome    a.) on COT managed by pain management   Chronic, continuous use of opioids    a.) chronic pain syndrome/chronic back pain; managed by pain management   CKD (chronic kidney disease) stage 3, GFR 30-59 ml/min (HCC)    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   a.) Rai stage III   DDD (degenerative disc disease), cervical    Difficult airway 10/22/2015    a.) 1st attempt with glidescope  --> macroglossic (unsuccessful); b.) 2nd attempt with Mac 4 and direct laryngoscopy with a 8.0 ETT --> tube too large to pass through the cords; b.) 3rd attempt (successful) with a Mac 4 and a 7.5 tube   Diverticulosis    DM (diabetes mellitus), type 2 (HCC)    Dysplasia of prostate    Erectile dysfunction    Frequent falls    Heart failure with mildly reduced ejection fraction (HFmrEF) (HCC)    a.) TTE 10/23/2015: EF 60-65%, mod-sev LVH, mild biatrial dil, degen MV disease, AoV sclerosis, asc Ao 38 mm, G1DD; b.) TTE 08/05/2022: EF 45-50%, mod-sev LVH (speckled pattern), sev biatrial dil, mild MR, AoV sclerosis, G1DD   Hepatic flexure mass 08/23/2022   a.) colonoscopy 08/23/2022: 14 x 15 mm partially obstructing ulcerated distal ascending colon mass; tissue friable --> pathology resulted as tubular adenoma, however felt to be false (-) --> referral to sugery and colectomy recommended; b.) s/p RIGHT hemicolectomy 09/28/2022   History of MRSA infection 08/28/2022   a.) MRSA PCR (+) 08/28/2022; culture from LEFT thigh abscess   HTN (hypertension)    Hyperlipidemia    Hypogonadism in male    Hypothyroidism    IDA (iron  deficiency anemia)    LBBB (left bundle branch block)    Long-term use of aspirin  therapy    Lumbar spinal stenosis    Mild cardiomegaly    Multiple lung nodules on CT    NICM (nonischemic cardiomyopathy) (HCC)    a.) TTE 10/23/2015: EF 60-65; b.) TTE 08/05/2022: EF 45-50%  Orchitis and epididymitis 06/16/2013   OSA on CPAP    Palindromic rheumatism, hand    Pericardial effusion    Prostatitis    Sepsis (HCC) 08/2022   Subdural hematoma (HCC) 10/22/2015   a.) s/p traumatic mechanical fall --> CT head 10/22/2015: high-density SDH the left cerebral convexity with13 mm midline shift --> s/p LEFT frontal burr hole craniotomy   Tubular adenoma of colon    Ventral hernia     PAST SURGICAL HISTORY:  Past Surgical History:  Procedure  Laterality Date   BACK SURGERY     BIOPSY  08/21/2022   Procedure: BIOPSY;  Surgeon: Onita Elspeth Sharper, DO;  Location: [facility] ENDOSCOPY;  Service: Gastroenterology;;   BIOPSY  08/23/2022   Procedure: BIOPSY;  Surgeon: Onita Elspeth Sharper, DO;  Location: [facility] ENDOSCOPY;  Service: Gastroenterology;;   BIOPSY  02/28/2023   Procedure: BIOPSY;  Surgeon: Onita Elspeth Sharper, DO;  Location: [facility] ENDOSCOPY;  Service: Gastroenterology;;   SOLMON DAKIN OF CRANIUM Left 10/22/2015   COLON SURGERY  09/28/2022   COLONOSCOPY WITH PROPOFOL  N/A 08/23/2022   Procedure: COLONOSCOPY WITH PROPOFOL ;  Surgeon: Onita Elspeth Sharper, DO;  Location: [facility] ENDOSCOPY;  Service: Gastroenterology;  Laterality: N/A;   COLONOSCOPY WITH PROPOFOL  N/A 02/28/2023   Procedure: COLONOSCOPY WITH PROPOFOL ;  Surgeon: Onita Elspeth Sharper, DO;  Location: [facility] ENDOSCOPY;  Service: Gastroenterology;  Laterality: N/A;  DM   COLONOSCOPY WITH PROPOFOL  N/A 04/25/2023   Procedure: COLONOSCOPY WITH PROPOFOL ;  Surgeon: Onita Elspeth Sharper, DO;  Location: [facility] ENDOSCOPY;  Service: Gastroenterology;  Laterality: N/A;  DM   ESOPHAGOGASTRODUODENOSCOPY (EGD) WITH PROPOFOL  N/A 08/21/2022   Procedure: ESOPHAGOGASTRODUODENOSCOPY (EGD) WITH PROPOFOL ;  Surgeon: Onita Elspeth Sharper, DO;  Location: [facility] ENDOSCOPY;  Service: Gastroenterology;  Laterality: N/A;   ESOPHAGOGASTRODUODENOSCOPY (EGD) WITH PROPOFOL  N/A 02/28/2023   Procedure: ESOPHAGOGASTRODUODENOSCOPY (EGD) WITH PROPOFOL ;  Surgeon: Onita Elspeth Sharper, DO;  Location: [facility] ENDOSCOPY;  Service: Gastroenterology;  Laterality: N/A;   GIVENS CAPSULE STUDY N/A 08/23/2022   Procedure: GIVENS CAPSULE STUDY;  Surgeon: Onita Elspeth Sharper, DO;  Location: [facility] ENDOSCOPY;  Service: Gastroenterology;  Laterality: N/A;   HERNIA REPAIR     INGUINAL HERNIA REPAIR Right 03/09/2023   Procedure: HERNIA REPAIR INGUINAL ADULT, open, RNFA to assist;  Surgeon: Jordis Laneta FALCON, MD;  Location: [facility] ORS;   Service: General;  Laterality: Right;   IR BONE MARROW BIOPSY & ASPIRATION  08/19/2023   IR US  GUIDE BX ASP/DRAIN  06/27/2023   IRRIGATION AND DEBRIDEMENT ABSCESS Left 08/28/2022   Procedure: IRRIGATION AND DEBRIDEMENT ABSCESS LEFT UPPER THIGH/GROIN;  Surgeon: Jordis Laneta FALCON, MD;  Location: [facility] ORS;  Service: General;  Laterality: Left;   LAPAROSCOPIC RIGHT COLECTOMY Right 09/28/2022   Procedure: LAPAROSCOPIC RIGHT COLECTOMY, RNFA to assist;  Surgeon: Jordis Laneta FALCON, MD;  Location: [facility] ORS;  Service: General;  Laterality: Right;   POLYPECTOMY  08/23/2022   Procedure: POLYPECTOMY;  Surgeon: Onita Elspeth Sharper, DO;  Location: [facility] ENDOSCOPY;  Service: Gastroenterology;;   POLYPECTOMY  04/25/2023   Procedure: POLYPECTOMY;  Surgeon: Onita Elspeth Sharper, DO;  Location: [facility] ENDOSCOPY;  Service: Gastroenterology;;   SUBMUCOSAL TATTOO INJECTION  08/23/2022   Procedure: SUBMUCOSAL TATTOO INJECTION;  Surgeon: Onita Elspeth Sharper, DO;  Location: [facility] ENDOSCOPY;  Service: Gastroenterology;;   TONSILLECTOMY     VENTRAL HERNIA REPAIR N/A 09/28/2022   Procedure: HERNIA REPAIR VENTRAL ADULT;  Surgeon: Jordis Laneta FALCON, MD;  Location: [facility] ORS;  Service: General;  Laterality: N/A;    HEMATOLOGY/ONCOLOGY HISTORY:  Oncology History  CLL (  chronic lymphocytic leukemia) (HCC)  12/25/2021 - 12/26/2021 Hospital Admission   Patient was admitted due to pain and swelling of right ankle, right knee and left wrist.  Patient was found to have WBC 30.4, with neutrophil 29% and lymphocyte 2%.  Peripheral smear showed leukocytosis with slight left shift in myeloid series and lymphocytosis with abnormal morphology.  Uric acid 5.5, worsening renal function with creatinine 1.35, BUN 18, GFR 55 (baseline creatinine 1.08 on 07/06/2021), lactic acid 1.1.  ESR normal, uric acid 5.5 normal, CRP 22.2, LDH 182  Orthopedic surgery was consulted s/p arthrocentesis, cell count 296 with 72% neutrophil, no crystal. Fluid negative for  growth.  Patient received cefepime  and vancomycin  while in the hospital, Patient was discharged on Keflex  for 5 days.   12/28/2021 Cancer Staging   Staging form: Chronic Lymphocytic Leukemia / Small Lymphocytic Lymphoma, AJCC 8th Edition - Clinical stage from 12/28/2021: Modified Rai Stage III (Modified Rai risk: High, Lymphocytosis: Present, Adenopathy: Present, Organomegaly: Absent, Anemia: Present, Thrombocytopenia: Absent) - Signed by Babara Call, MD on 01/20/2022 Stage prefix: Initial diagnosis Hemoglobin (Hgb) (g/dL): 89.8   87/01/7974 Initial Diagnosis   CLL (chronic lymphocytic leukemia)   12/27/21 peripheral blood flowcytometry showed Involvement by CD5+, CD23+, CD20+, CD22+ clonal B cell population, phenotype typical for 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    01/05/2022 Imaging   CT chest abdomen pelvis wo contrast 1. Multiple prominent borderline enlarged and mildly enlarged lymph nodes, most evident in the low anatomic pelvis, as above, compatible with reported clinical history of CLL. 2. There also several small pulmonary nodules in the lungs measuring 5 mm or less in size. This is nonspecific, but statistically likely benign. No follow-up needed if patient is low-risk (and has no known or suspected primary neoplasm). Non-contrast chest CT can be considered in 12 months if patient is high-risk. This recommendation 3. Aortic atherosclerosis, in addition to left main and 2 vessel coronary artery disease. Please note that although the presence of coronary artery calcium  documents the presence of coronary artery disease, the severity of this disease and any potential stenosis cannot be assessed on this non-gated CT examination. Assessment for  potential risk factor modification, dietary therapy or pharmacologic therapy may be  warranted, if clinically indicated. 4. There are calcifications of the aortic valve. Echocardiographic correlation for evaluation of potential valvular dysfunction may be warranted if clinically indicated. 5. 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 6. Diverticulosis without evidence of acute diverticulitis at this time. 7. Mild cardiomegaly.   08/26/2022 Imaging   CT abdomen pelvis w contrast  1. Subcutaneous fat stranding within the left lower quadrant anterior abdominal wall extending into the anterior proximal left thigh, likely related to contusion given history of recent trauma. No fluid collection or hematoma. 2. No acute displaced fracture. 3. Retroperitoneal and pelvic lymphadenopathy as above, with waxing and waning appearance. 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 4. 1.9 cm hypodensity within the medial aspect of the spleen,nonspecific. Leukemic involvement of the spleen cannot be excluded. 5. Distal colonic diverticulosis without diverticulitis. 6.  Aortic Atherosclerosis    07/24/2023 Imaging   MRI cervical spine with and without contrast  1. Enhancing and indeterminate right C4 posterior element/facet bone lesion, which is superimposed on diffusely abnormal but nonspecific generalized decreased T1 marrow signal in the visible spine and at the skull base.   Marrow  infiltrative processes such as myelofibrosis or lymphoma cannot be excluded. But the absence of other suspicious marrow edema or enhancement argues against metastatic disease or multiple myeloma. And furthermore similar generalized abnormal marrow signal can be caused by chronic anemia, smoking, obesity.   2. Underlying severe chronic cervical spine degeneration with reversal of lordosis and multifactorial degenerative cervical spinal stenosis at all levels. Associated moderate spinal stenosis and moderate (up to severe) spinal cord mass effect C3-C4 through C5-C6. No associated  spinal cord edema or myelomalacia. Associated moderate or severe degenerative neural foraminal stenosis at the bilateral C4 through C8 nerve levels.       08/19/2023 Bone Marrow Biopsy   Bone marrow biopsy  aspirate clot core  - Hypercellular bone marrow (80%) involved by the patient's known  chronic lymphocytic leukemia at approximately 50% of the cellular  marrow.      ALLERGIES:  has no known allergies.  MEDICATIONS:  Current Outpatient Medications  Medication Sig Dispense Refill   acetaminophen  (TYLENOL ) 325 MG tablet Take 2 tablets (650 mg total) by mouth every 6 (six) hours as needed for mild pain (pain score 1-3) or fever.     albuterol  (VENTOLIN  HFA) 108 (90 Base) MCG/ACT inhaler Inhale 2 puffs into the lungs every 4 (four) hours as needed for wheezing or shortness of breath.     aspirin  EC 81 MG tablet Take 1 tablet (81 mg total) by mouth daily. Swallow whole.     empagliflozin  (JARDIANCE ) 10 MG TABS tablet Take 1 tablet (10 mg total) by mouth daily before breakfast. 90 tablet 2   fluticasone -salmeterol (ADVAIR) 250-50 MCG/ACT AEPB Inhale 1 puff into the lungs in the morning and at bedtime.     furosemide  (LASIX ) 20 MG tablet Take 20 mg by mouth daily as needed.     hydrALAZINE  (APRESOLINE ) 50 MG tablet Take 2 tablets (100 mg total) by mouth 2 (two) times daily. 180 tablet 3   ipratropium (ATROVENT) 0.03 % nasal spray Place 2 sprays into the nose 2 (two) times daily.     levothyroxine  (SYNTHROID ) 200 MCG tablet Take 1 tablet (200 mcg total) by mouth daily before breakfast. 90 tablet 0   metFORMIN  (GLUCOPHAGE ) 500 MG tablet Take 2 tablets (1,000 mg total) by mouth 2 (two) times daily with a meal. 120 tablet 0   metoprolol  succinate (TOPROL  XL) 25 MG 24 hr tablet Take 1 tablet (25 mg total) by mouth daily. 90 tablet 1   oxyCODONE -acetaminophen  (PERCOCET) 10-325 MG tablet Take 1 tablet by mouth every 8 (eight) hours as needed for pain. Must last 30 days. 90 tablet 0   [START ON  11/16/2023] oxyCODONE -acetaminophen  (PERCOCET) 10-325 MG tablet Take 1 tablet by mouth every 8 (eight) hours as needed for pain. Must last 30 days. 90 tablet 0   rosuvastatin  (CRESTOR ) 40 MG tablet Take 1 tablet (40 mg total) by mouth daily. 90 tablet 1   sacubitril -valsartan  (ENTRESTO ) 97-103 MG Take 1 tablet by mouth 2 (two) times daily. 120 tablet 3   spironolactone  (ALDACTONE ) 25 MG tablet Take 0.5 tablets (12.5 mg total) by mouth daily. 45 tablet 3   zanubrutinib  (BRUKINSA ) 80 MG capsule Take 2 capsules (160 mg total) by mouth 2 (two) times daily. 120 capsule 2   No current facility-administered medications for this visit.    VITAL SIGNS: There were no vitals taken for this visit. There were no vitals filed for this visit.  Estimated body mass index is 27.67 kg/m as calculated from the following:  Height as of 10/01/23: 6' 1 (1.854 m).   Weight as of 11/01/23: 209 lb 11.2 oz (95.1 kg).  LABS: CBC:    Component Value Date/Time   WBC 5.6 11/01/2023 1253   WBC 12.3 (H) 10/01/2023 0129   HGB 9.8 (L) 11/01/2023 1253   HGB 9.1 (L) 12/22/2022 1422   HCT 31.3 (L) 11/01/2023 1253   HCT 29.5 (L) 12/22/2022 1422   PLT 173 11/01/2023 1253   PLT 150 12/22/2022 1422   MCV 90.5 11/01/2023 1253   MCV 91 12/22/2022 1422   MCV 92 10/06/2013 1441   NEUTROABS 0.3 (LL) 11/01/2023 1253   NEUTROABS 3.4 12/22/2022 1422   NEUTROABS 3.3 10/06/2013 1441   LYMPHSABS 4.7 (H) 11/01/2023 1253   LYMPHSABS 5.7 (H) 12/22/2022 1422   LYMPHSABS 2.3 10/06/2013 1441   MONOABS 0.4 11/01/2023 1253   MONOABS 0.4 10/06/2013 1441   EOSABS 0.2 11/01/2023 1253   EOSABS 0.2 12/22/2022 1422   EOSABS 0.4 10/06/2013 1441   BASOSABS 0.0 11/01/2023 1253   BASOSABS 0.1 12/22/2022 1422   BASOSABS 0.1 10/06/2013 1441   Comprehensive Metabolic Panel:    Component Value Date/Time   NA 139 11/01/2023 1410   NA 146 (H) 12/22/2022 1422   NA 139 10/06/2013 1441   K 4.0 11/01/2023 1410   K 4.1 10/06/2013 1441   CL 106  11/01/2023 1410   CL 102 10/06/2013 1441   CO2 24 11/01/2023 1410   CO2 28 10/06/2013 1441   BUN 15 11/01/2023 1410   BUN 12 12/22/2022 1422   BUN 27 (H) 10/06/2013 1441   CREATININE 1.10 11/01/2023 1410   CREATININE 1.21 10/03/2023 1335   CREATININE 1.08 07/06/2021 1030   GLUCOSE 109 (H) 11/01/2023 1410   GLUCOSE 206 (H) 10/06/2013 1441   CALCIUM  8.8 (L) 11/01/2023 1410   CALCIUM  9.1 10/06/2013 1441   AST 36 11/01/2023 1410   AST 26 10/03/2023 1335   ALT 20 11/01/2023 1410   ALT 16 10/03/2023 1335   ALT 22 10/06/2013 1441   ALKPHOS 76 11/01/2023 1410   ALKPHOS 70 10/06/2013 1441   BILITOT 0.9 11/01/2023 1410   BILITOT 0.8 10/03/2023 1335   PROT 6.9 11/01/2023 1410   PROT 6.2 12/22/2022 1422   PROT 7.9 10/06/2013 1441   ALBUMIN 3.8 11/01/2023 1410   ALBUMIN 4.1 12/22/2022 1422   ALBUMIN 3.3 (L) 10/06/2013 1441    RADIOGRAPHIC STUDIES: No results found.  PERFORMANCE STATUS (ECOG) : 1 - Symptomatic but completely ambulatory  Review of Systems Unless otherwise noted, a complete review of systems is negative.  Physical Exam General: NAD Cardiovascular: regular rate and rhythm Pulmonary: clear ant fields Abdomen: soft, nontender, + bowel sounds GU: no suprapubic tenderness Extremities: no edema, no joint deformities Skin: no rashes Neurological: Weakness but otherwise nonfocal  IMPRESSION/PLAN: CLL -continue holding Zanubrutinib  until directed by Dr. Babara  Neutropenia -patient actually has mild leukocytosis now.  Does not require G-CSF.  Unclear if elevated WBC in setting of infection.  Cough/shortness of breath -clinically suspicious for pneumonia.  CT of the chest pending read by radiology.  Discussed with Dr. Babara and will empirically cover patient with Augmentin  twice daily x 10 days.  Recommended the patient restart his COPD medications/inhalers.  Patient instructed to follow-up with PCP next week.  Also recommended that he make another appointment with his  pulmonologist.  Case and plan discussed with Dr. Babara  Patient expressed understanding and was in agreement with this plan. He also understands that He  can call clinic at any time with any questions, concerns, or complaints.   Thank you for allowing me to participate in the care of this very pleasant patient.   Time Total: 15 minutes  Visit consisted of counseling and education dealing with the complex and emotionally intense issues of symptom management in the setting of serious illness.Greater than 50%  of this time was spent counseling and coordinating care related to the above assessment and plan.  Signed by: Fonda Mower, PhD, NP-C

## 2023-11-10 NOTE — Progress Notes (Signed)
 No Zarxio today

## 2023-11-10 NOTE — Progress Notes (Addendum)
 Pt c/o cough, shortness of breath. I feel like my lung hurt when I breath like I did when I had pneumonia.  Pt has gained wt to 213 lbs. He is not taking any of his diuretics/antihypertensives/inhalers or nebulizers. He stated that he ran out of some of his routine meds. Pt advised pt to f/u with pcp and check bps at home. He is holding his Brukinsa  as directed due to recent neutropenia. Patient had a chest ct yesterday, which has not yet been read. I called radiology dept and requested a stat read of scan.

## 2023-11-10 NOTE — Progress Notes (Signed)
 Call made to pt. No answer.

## 2023-11-11 ENCOUNTER — Encounter: Payer: Self-pay | Admitting: Oncology

## 2023-11-14 ENCOUNTER — Ambulatory Visit: Payer: Self-pay | Admitting: Oncology

## 2023-11-14 ENCOUNTER — Other Ambulatory Visit (HOSPITAL_COMMUNITY): Payer: Self-pay

## 2023-11-14 ENCOUNTER — Other Ambulatory Visit: Payer: Self-pay | Admitting: Oncology

## 2023-11-14 DIAGNOSIS — C9110 Chronic lymphocytic leukemia of B-cell type not having achieved remission: Secondary | ICD-10-CM

## 2023-11-15 ENCOUNTER — Other Ambulatory Visit: Payer: Self-pay

## 2023-11-15 ENCOUNTER — Encounter: Payer: Self-pay | Admitting: Oncology

## 2023-11-15 DIAGNOSIS — C9110 Chronic lymphocytic leukemia of B-cell type not having achieved remission: Secondary | ICD-10-CM

## 2023-11-15 NOTE — Telephone Encounter (Signed)
 Rosina can you please schedule and contact patient with appointment date and time. I have ordered labs.

## 2023-11-15 NOTE — Telephone Encounter (Signed)
-----   Message from Zelphia Cap sent at 11/14/2023 10:35 PM EDT ----- Please arrange him to do a follow up appointment with me next week Lab MD cbc cmp L[facility]. Thanks.  ----- Message ----- From: Rebecka, Lab In [place] Sent: 11/10/2023   3:23 PM EDT To: Zelphia Cap, MD

## 2023-11-17 ENCOUNTER — Other Ambulatory Visit: Payer: Self-pay

## 2023-11-18 ENCOUNTER — Telehealth: Payer: Self-pay

## 2023-11-18 NOTE — Telephone Encounter (Signed)
 Patient showed up to clinic this afternoon wanting to know about CT scan results because he is very short of breath.   Per Dr. Babara, CT scan showed possible pneumonia and it was treated with antibiotics. If he continues to be short of breath she recommends ER for further eval.   Pt left so call back made. No answer, detailed VM left to pt letting him know MD recommendation.

## 2023-11-21 ENCOUNTER — Other Ambulatory Visit: Payer: Self-pay

## 2023-11-21 ENCOUNTER — Emergency Department

## 2023-11-21 ENCOUNTER — Inpatient Hospital Stay
Admission: EM | Admit: 2023-11-21 | Discharge: 2023-11-29 | DRG: 291 | Disposition: A | Discharge status: Home Health | Attending: Internal Medicine | Admitting: Internal Medicine

## 2023-11-21 DIAGNOSIS — R0600 Dyspnea, unspecified: Secondary | ICD-10-CM

## 2023-11-21 DIAGNOSIS — L0231 Cutaneous abscess of buttock: Secondary | ICD-10-CM | POA: Diagnosis present

## 2023-11-21 DIAGNOSIS — Z9049 Acquired absence of other specified parts of digestive tract: Secondary | ICD-10-CM

## 2023-11-21 DIAGNOSIS — I130 Hypertensive heart and chronic kidney disease with heart failure and stage 1 through stage 4 chronic kidney disease, or unspecified chronic kidney disease: Principal | ICD-10-CM | POA: Diagnosis present

## 2023-11-21 DIAGNOSIS — Z1152 Encounter for screening for COVID-19: Secondary | ICD-10-CM

## 2023-11-21 DIAGNOSIS — I509 Heart failure, unspecified: Secondary | ICD-10-CM | POA: Diagnosis not present

## 2023-11-21 DIAGNOSIS — I428 Other cardiomyopathies: Secondary | ICD-10-CM | POA: Diagnosis present

## 2023-11-21 DIAGNOSIS — Z809 Family history of malignant neoplasm, unspecified: Secondary | ICD-10-CM

## 2023-11-21 DIAGNOSIS — Z825 Family history of asthma and other chronic lower respiratory diseases: Secondary | ICD-10-CM

## 2023-11-21 DIAGNOSIS — Z7982 Long term (current) use of aspirin: Secondary | ICD-10-CM

## 2023-11-21 DIAGNOSIS — N1831 Chronic kidney disease, stage 3a: Secondary | ICD-10-CM | POA: Diagnosis present

## 2023-11-21 DIAGNOSIS — Z7984 Long term (current) use of oral hypoglycemic drugs: Secondary | ICD-10-CM

## 2023-11-21 DIAGNOSIS — Z79890 Hormone replacement therapy (postmenopausal): Secondary | ICD-10-CM

## 2023-11-21 DIAGNOSIS — Z7951 Long term (current) use of inhaled steroids: Secondary | ICD-10-CM

## 2023-11-21 DIAGNOSIS — I3139 Other pericardial effusion (noninflammatory): Secondary | ICD-10-CM | POA: Diagnosis present

## 2023-11-21 DIAGNOSIS — I77810 Thoracic aortic ectasia: Secondary | ICD-10-CM | POA: Diagnosis present

## 2023-11-21 DIAGNOSIS — E1169 Type 2 diabetes mellitus with other specified complication: Secondary | ICD-10-CM

## 2023-11-21 DIAGNOSIS — Z87891 Personal history of nicotine dependence: Secondary | ICD-10-CM

## 2023-11-21 DIAGNOSIS — Z860101 Personal history of adenomatous and serrated colon polyps: Secondary | ICD-10-CM

## 2023-11-21 DIAGNOSIS — M549 Dorsalgia, unspecified: Secondary | ICD-10-CM | POA: Diagnosis present

## 2023-11-21 DIAGNOSIS — I2510 Atherosclerotic heart disease of native coronary artery without angina pectoris: Secondary | ICD-10-CM | POA: Diagnosis present

## 2023-11-21 DIAGNOSIS — I5043 Acute on chronic combined systolic (congestive) and diastolic (congestive) heart failure: Secondary | ICD-10-CM | POA: Diagnosis not present

## 2023-11-21 DIAGNOSIS — D3502 Benign neoplasm of left adrenal gland: Secondary | ICD-10-CM | POA: Diagnosis present

## 2023-11-21 DIAGNOSIS — Z79899 Other long term (current) drug therapy: Secondary | ICD-10-CM

## 2023-11-21 DIAGNOSIS — L03317 Cellulitis of buttock: Secondary | ICD-10-CM | POA: Diagnosis present

## 2023-11-21 DIAGNOSIS — Z818 Family history of other mental and behavioral disorders: Secondary | ICD-10-CM

## 2023-11-21 DIAGNOSIS — Z79891 Long term (current) use of opiate analgesic: Secondary | ICD-10-CM

## 2023-11-21 DIAGNOSIS — D631 Anemia in chronic kidney disease: Secondary | ICD-10-CM | POA: Diagnosis present

## 2023-11-21 DIAGNOSIS — J441 Chronic obstructive pulmonary disease with (acute) exacerbation: Secondary | ICD-10-CM | POA: Diagnosis present

## 2023-11-21 DIAGNOSIS — Z9221 Personal history of antineoplastic chemotherapy: Secondary | ICD-10-CM

## 2023-11-21 DIAGNOSIS — G4733 Obstructive sleep apnea (adult) (pediatric): Secondary | ICD-10-CM | POA: Diagnosis present

## 2023-11-21 DIAGNOSIS — E785 Hyperlipidemia, unspecified: Secondary | ICD-10-CM | POA: Diagnosis present

## 2023-11-21 DIAGNOSIS — Z87430 Personal history of prostatic dysplasia: Secondary | ICD-10-CM

## 2023-11-21 DIAGNOSIS — G894 Chronic pain syndrome: Secondary | ICD-10-CM | POA: Diagnosis present

## 2023-11-21 DIAGNOSIS — C9110 Chronic lymphocytic leukemia of B-cell type not having achieved remission: Secondary | ICD-10-CM | POA: Diagnosis present

## 2023-11-21 DIAGNOSIS — N400 Enlarged prostate without lower urinary tract symptoms: Secondary | ICD-10-CM | POA: Diagnosis present

## 2023-11-21 DIAGNOSIS — E039 Hypothyroidism, unspecified: Secondary | ICD-10-CM | POA: Diagnosis present

## 2023-11-21 DIAGNOSIS — E1122 Type 2 diabetes mellitus with diabetic chronic kidney disease: Secondary | ICD-10-CM | POA: Diagnosis present

## 2023-11-21 LAB — BASIC METABOLIC PANEL WITH GFR
Anion gap: 9 (ref 5–15)
BUN: 18 mg/dL (ref 8–23)
CO2: 22 mmol/L (ref 22–32)
Calcium: 8.7 mg/dL — ABNORMAL LOW (ref 8.9–10.3)
Chloride: 107 mmol/L (ref 98–111)
Creatinine, Ser: 1 mg/dL (ref 0.61–1.24)
GFR, Estimated: >60 mL/min (ref >60)
Glucose, Bld: 103 mg/dL — ABNORMAL HIGH (ref 70–99)
Potassium: 4 mmol/L (ref 3.5–5.1)
Sodium: 138 mmol/L (ref 135–145)

## 2023-11-21 LAB — CBC
HCT: 32.5 % — ABNORMAL LOW (ref 39.0–52.0)
Hemoglobin: 9.9 g/dL — ABNORMAL LOW (ref 13.0–17.0)
MCH: 28 pg (ref 26.0–34.0)
MCHC: 30.5 g/dL (ref 30.0–36.0)
MCV: 91.8 fL (ref 80.0–100.0)
Platelets: 246 K/uL (ref 150–400)
RBC: 3.54 MIL/uL — ABNORMAL LOW (ref 4.22–5.81)
RDW: 18.9 % — ABNORMAL HIGH (ref 11.5–15.5)
WBC: 10.6 K/uL — ABNORMAL HIGH (ref 4.0–10.5)
nRBC: 0 % (ref 0.0–0.2)

## 2023-11-21 LAB — RESP PANEL BY RT-PCR (RSV, FLU A&B, COVID)  RVPGX2
Influenza A by PCR: NEGATIVE
Influenza B by PCR: NEGATIVE
Resp Syncytial Virus by PCR: NEGATIVE
SARS Coronavirus 2 by RT PCR: NEGATIVE

## 2023-11-21 LAB — GLUCOSE, CAPILLARY
Glucose-Capillary: 88 mg/dL (ref 70–99)
Glucose-Capillary: 247 mg/dL — ABNORMAL HIGH (ref 70–99)

## 2023-11-21 LAB — TROPONIN I (HIGH SENSITIVITY)
Troponin I (High Sensitivity): 42 ng/L — ABNORMAL HIGH (ref <18)
Troponin I (High Sensitivity): 40 ng/L — ABNORMAL HIGH (ref <18)

## 2023-11-21 LAB — BRAIN NATRIURETIC PEPTIDE: B Natriuretic Peptide: 1856.6 pg/mL — ABNORMAL HIGH (ref 0.0–100.0)

## 2023-11-21 MED ORDER — OXYCODONE-ACETAMINOPHEN 10-325 MG PO TABS: 1.0000 | ORAL_TABLET | Freq: Three times a day (TID) | ORAL | Status: DC | PRN

## 2023-11-21 MED ORDER — HYDRALAZINE HCL 20 MG/ML IJ SOLN: 10.0000 mg | Freq: Once | INTRAMUSCULAR | Status: DC

## 2023-11-21 MED ORDER — SODIUM CHLORIDE 0.9% FLUSH
3.0000 mL | Freq: Two times a day (BID) | INTRAVENOUS | Status: DC
  Administered 2023-11-21 – 2023-11-27 (×13): 3 mL via INTRAVENOUS

## 2023-11-21 MED ORDER — GUAIFENESIN ER 600 MG PO TB12
1200.0000 mg | ORAL_TABLET | Freq: Two times a day (BID) | ORAL | Status: DC
  Administered 2023-11-21 – 2023-11-22 (×2): 1200 mg via ORAL
  Filled 2023-11-21 (×2): qty 2

## 2023-11-21 MED ORDER — OXYCODONE HCL 5 MG PO TABS
10.0000 mg | ORAL_TABLET | Freq: Three times a day (TID) | ORAL | Status: DC | PRN
  Administered 2023-11-24 – 2023-11-28 (×7): 10 mg via ORAL
  Filled 2023-11-21 (×8): qty 2

## 2023-11-21 MED ORDER — SACUBITRIL-VALSARTAN 97-103 MG PO TABS: 1.0000 | ORAL_TABLET | Freq: Two times a day (BID) | ORAL | Status: DC

## 2023-11-21 MED ORDER — MIRTAZAPINE 15 MG PO TABS
7.5000 mg | ORAL_TABLET | Freq: Every day | ORAL | Status: DC
  Administered 2023-11-21 – 2023-11-28 (×8): 7.5 mg via ORAL
  Filled 2023-11-21 (×8): qty 1

## 2023-11-21 MED ORDER — IPRATROPIUM-ALBUTEROL 0.5-2.5 (3) MG/3ML IN SOLN
3.0000 mL | Freq: Once | RESPIRATORY_TRACT | Status: AC
  Administered 2023-11-21: 3 mL via RESPIRATORY_TRACT
  Filled 2023-11-21: qty 6

## 2023-11-21 MED ORDER — IPRATROPIUM-ALBUTEROL 0.5-2.5 (3) MG/3ML IN SOLN
3.0000 mL | Freq: Four times a day (QID) | RESPIRATORY_TRACT | Status: DC
  Administered 2023-11-21: 3 mL via RESPIRATORY_TRACT
  Filled 2023-11-21: qty 3

## 2023-11-21 MED ORDER — SODIUM CHLORIDE 0.9% FLUSH
3.0000 mL | INTRAVENOUS | Status: DC | PRN
Start: 2023-11-21 — End: 2023-11-29

## 2023-11-21 MED ORDER — SODIUM CHLORIDE 0.9 % IV SOLN: 250.0000 mL | INTRAVENOUS | Status: AC | PRN

## 2023-11-21 MED ORDER — ALBUTEROL SULFATE HFA 108 (90 BASE) MCG/ACT IN AERS: 2.0000 | INHALATION_SPRAY | RESPIRATORY_TRACT | 2 refills | Status: AC | PRN

## 2023-11-21 MED ORDER — SACUBITRIL-VALSARTAN 24-26 MG PO TABS
1.0000 | ORAL_TABLET | Freq: Two times a day (BID) | ORAL | Status: DC
  Administered 2023-11-21 – 2023-11-29 (×16): 1 via ORAL
  Filled 2023-11-21 (×16): qty 1

## 2023-11-21 MED ORDER — ONDANSETRON HCL 4 MG/2ML IJ SOLN: 4.0000 mg | Freq: Four times a day (QID) | INTRAMUSCULAR | Status: DC | PRN

## 2023-11-21 MED ORDER — BENZONATATE 100 MG PO CAPS: 200.0000 mg | ORAL_CAPSULE | Freq: Three times a day (TID) | ORAL | Status: DC | PRN

## 2023-11-21 MED ORDER — FUROSEMIDE 10 MG/ML IJ SOLN
40.0000 mg | Freq: Two times a day (BID) | INTRAMUSCULAR | Status: DC
  Administered 2023-11-22 – 2023-11-26 (×9): 40 mg via INTRAVENOUS
  Filled 2023-11-21 (×9): qty 4

## 2023-11-21 MED ORDER — LEVOTHYROXINE SODIUM 100 MCG PO TABS
200.0000 ug | ORAL_TABLET | Freq: Every day | ORAL | Status: DC
  Administered 2023-11-22 – 2023-11-29 (×8): 200 ug via ORAL
  Filled 2023-11-21 (×8): qty 2

## 2023-11-21 MED ORDER — PREDNISONE 20 MG PO TABS
40.0000 mg | ORAL_TABLET | Freq: Every day | ORAL | Status: DC
  Administered 2023-11-21 – 2023-11-22 (×2): 40 mg via ORAL
  Filled 2023-11-21 (×2): qty 2

## 2023-11-21 MED ORDER — ENOXAPARIN SODIUM 40 MG/0.4ML IJ SOSY
40.0000 mg | PREFILLED_SYRINGE | Freq: Every day | INTRAMUSCULAR | Status: DC
  Administered 2023-11-21 – 2023-11-28 (×8): 40 mg via SUBCUTANEOUS
  Filled 2023-11-21 (×8): qty 0.4

## 2023-11-21 MED ORDER — HYDRALAZINE HCL 50 MG PO TABS
100.0000 mg | ORAL_TABLET | Freq: Two times a day (BID) | ORAL | Status: DC
  Administered 2023-11-21 – 2023-11-29 (×16): 100 mg via ORAL
  Filled 2023-11-21 (×17): qty 2

## 2023-11-21 MED ORDER — IPRATROPIUM-ALBUTEROL 0.5-2.5 (3) MG/3ML IN SOLN
3.0000 mL | Freq: Once | RESPIRATORY_TRACT | Status: AC
  Administered 2023-11-21: 3 mL via RESPIRATORY_TRACT

## 2023-11-21 MED ORDER — FUROSEMIDE 10 MG/ML IJ SOLN
60.0000 mg | Freq: Once | INTRAMUSCULAR | Status: AC
  Administered 2023-11-21: 60 mg via INTRAVENOUS
  Filled 2023-11-21: qty 8

## 2023-11-21 MED ORDER — IPRATROPIUM-ALBUTEROL 0.5-2.5 (3) MG/3ML IN SOLN: 3.0000 mL | Freq: Two times a day (BID) | RESPIRATORY_TRACT | Status: DC

## 2023-11-21 MED ORDER — BUDESON-GLYCOPYRROL-FORMOTEROL 160-9-4.8 MCG/ACT IN AERO
2.0000 | INHALATION_SPRAY | Freq: Two times a day (BID) | RESPIRATORY_TRACT | Status: DC
  Administered 2023-11-22 – 2023-11-29 (×14): 2 via RESPIRATORY_TRACT
  Filled 2023-11-21: qty 5.9

## 2023-11-21 MED ORDER — INSULIN ASPART 100 UNIT/ML IJ SOLN
0.0000 [IU] | Freq: Three times a day (TID) | INTRAMUSCULAR | Status: DC
  Administered 2023-11-22 (×2): 3 [IU] via SUBCUTANEOUS
  Administered 2023-11-23: 5 [IU] via SUBCUTANEOUS
  Administered 2023-11-23: 3 [IU] via SUBCUTANEOUS
  Administered 2023-11-24: 11 [IU] via SUBCUTANEOUS
  Administered 2023-11-24: 3 [IU] via SUBCUTANEOUS
  Administered 2023-11-25: 5 [IU] via SUBCUTANEOUS
  Administered 2023-11-25: 8 [IU] via SUBCUTANEOUS
  Administered 2023-11-26 – 2023-11-27 (×2): 5 [IU] via SUBCUTANEOUS
  Administered 2023-11-27: 3 [IU] via SUBCUTANEOUS
  Administered 2023-11-28: 5 [IU] via SUBCUTANEOUS
  Administered 2023-11-28: 2 [IU] via SUBCUTANEOUS
  Administered 2023-11-29: 3 [IU] via SUBCUTANEOUS
  Filled 2023-11-21 (×14): qty 1

## 2023-11-21 MED ORDER — ASPIRIN 81 MG PO TBEC
81.0000 mg | DELAYED_RELEASE_TABLET | Freq: Every day | ORAL | Status: DC
  Administered 2023-11-22 – 2023-11-29 (×8): 81 mg via ORAL
  Filled 2023-11-21 (×8): qty 1

## 2023-11-21 MED ORDER — CARVEDILOL 6.25 MG PO TABS: 3.1250 mg | ORAL_TABLET | Freq: Two times a day (BID) | ORAL | Status: DC

## 2023-11-21 MED ORDER — ACETAMINOPHEN 325 MG PO TABS
650.0000 mg | ORAL_TABLET | ORAL | Status: DC | PRN
  Administered 2023-11-26: 650 mg via ORAL
  Filled 2023-11-21: qty 2

## 2023-11-21 MED ORDER — ROSUVASTATIN CALCIUM 20 MG PO TABS: 40.0000 mg | ORAL_TABLET | Freq: Every day | ORAL | Status: DC

## 2023-11-21 MED ORDER — ACETAMINOPHEN 325 MG PO TABS: 325.0000 mg | ORAL_TABLET | Freq: Three times a day (TID) | ORAL | Status: DC | PRN

## 2023-11-21 NOTE — Progress Notes (Signed)
 Patient admitted to PCU from ED. Upon arrival, patient placed on continuous cardiac telemetry monitoring. Vital signs obtained and documented. Patient oriented to room, call bell system, and unit routines. Safety measures implemented: bed in lowest position, call bell in reach, and side rails up x3. Patient repositioned for comfort and skin protection. Initial head-to-toes assessment completed. Patient resting comfortably in bed, no acute distress noted at this time.

## 2023-11-21 NOTE — H&P (Signed)
 History and Physical    Paul Mazzie [name] FMW:[id number] DOB: [birth year] DOA: 11/21/2023  PCP: Cyrus Selinda Moose, PA-C (Confirm with patient/family/NH records and if not entered, this has to be entered at [facility] point of entry) Patient coming from: Home  I have personally briefly reviewed patient's old medical records in [facility]  Chief Complaint: Cough, wheezing, shortness of breath  HPI: Paul Lauderback [name] is a 78 y.o. male with medical history significant of chronic combined HFrEF and HFpEF, CLL, HTN, HLD, IIDM, hypothyroidism, CKD stage IIIa, BPH, SDH, OSA not on CPAP, chronic pain and narcotic dependence, COPD Gold stage I?  Presented with worsening of cough wheezing shortness of breath.  Symptoms started about 2 weeks ago, patient started to have a productive cough with whitish sputum production, and wheezing and increasing exertional dyspnea.  Was prescribed with 5 days course of Augmentin , however despite taking antibiotics his symptoms did not significantly improved.  Denied any chest pain no fever or chills.  Last few days he also found significant orthopnea.  He does weigh himself and found his weight has been  fluctuating but denied any ankle swelling.  ED Course: Afebrile, blood pressure elevated 160/85 ultrafiltration 99% on room air.  Chest x-ray showed interstitial edema, blood work showed BUN 18 creatinine 1.0 potassium 4.0 glucose 103 bicarb 22, WBC 10.6, hemoglobin 9.9.  Patient was given IV Lasix  60 mg x 1 in the ED.  Review of Systems: As per HPI otherwise 14 point review of systems negative.    Past Medical History:  Diagnosis Date   Adenoma of left adrenal gland    Anemia    Aortic atherosclerosis    Blood transfusion without reported diagnosis    BPH (benign prostatic hyperplasia)    Bradycardia    CAD (coronary artery disease) 08/26/2022   a.) cCTA 08/26/2022: Ca2+ 14.3 (12th %'ile for age/sex.race match control); (<25%) pLAD    Cellulitis of left thigh 08/2022   Chronic pain syndrome    a.) on COT managed by pain management   Chronic, continuous use of opioids    a.) chronic pain syndrome/chronic back pain; managed by pain management   CKD (chronic kidney disease) stage 3, GFR 30-59 ml/min (HCC)    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   a.) Rai stage III   DDD (degenerative disc disease), cervical    Difficult airway 10/22/2015   a.) 1st attempt with glidescope  --> macroglossic (unsuccessful); b.) 2nd attempt with Mac 4 and direct laryngoscopy with a 8.0 ETT --> tube too large to pass through the cords; b.) 3rd attempt (successful) with a Mac 4 and a 7.5 tube   Diverticulosis    DM (diabetes mellitus), type 2 (HCC)    Dysplasia of prostate    Erectile dysfunction    Frequent falls    Heart failure with mildly reduced ejection fraction (HFmrEF) (HCC)    a.) TTE 10/23/2015: EF 60-65%, mod-sev LVH, mild biatrial dil, degen MV disease, AoV sclerosis, asc Ao 38 mm, G1DD; b.) TTE 08/05/2022: EF 45-50%, mod-sev LVH (speckled pattern), sev biatrial dil, mild MR, AoV sclerosis, G1DD   Hepatic flexure mass 08/23/2022   a.) colonoscopy 08/23/2022: 14 x 15 mm partially obstructing ulcerated distal ascending colon mass; tissue friable --> pathology resulted as tubular adenoma, however felt to be false (-) --> referral to sugery and colectomy recommended; b.) s/p RIGHT hemicolectomy 09/28/2022   History of MRSA infection 08/28/2022   a.) MRSA PCR (+) 08/28/2022; culture from  LEFT thigh abscess   HTN (hypertension)    Hyperlipidemia    Hypogonadism in male    Hypothyroidism    IDA (iron  deficiency anemia)    LBBB (left bundle branch block)    Long-term use of aspirin  therapy    Lumbar spinal stenosis    Mild cardiomegaly    Multiple lung nodules on CT    NICM (nonischemic cardiomyopathy) (HCC)    a.) TTE 10/23/2015: EF 60-65; b.) TTE 08/05/2022: EF 45-50%   Orchitis and epididymitis 06/16/2013   OSA on  CPAP    Palindromic rheumatism, hand    Pericardial effusion    Prostatitis    Sepsis (HCC) 08/2022   Subdural hematoma (HCC) 10/22/2015   a.) s/p traumatic mechanical fall --> CT head 10/22/2015: high-density SDH the left cerebral convexity with13 mm midline shift --> s/p LEFT frontal burr hole craniotomy   Tubular adenoma of colon    Ventral hernia     Past Surgical History:  Procedure Laterality Date   BACK SURGERY     BIOPSY  08/21/2022   Procedure: BIOPSY;  Surgeon: Onita Elspeth Sharper, DO;  Location: [facility] ENDOSCOPY;  Service: Gastroenterology;;   BIOPSY  08/23/2022   Procedure: BIOPSY;  Surgeon: Onita Elspeth Sharper, DO;  Location: [facility] ENDOSCOPY;  Service: Gastroenterology;;   BIOPSY  02/28/2023   Procedure: BIOPSY;  Surgeon: Onita Elspeth Sharper, DO;  Location: [facility] ENDOSCOPY;  Service: Gastroenterology;;   SOLMON DAKIN OF CRANIUM Left 10/22/2015   COLON SURGERY  09/28/2022   COLONOSCOPY WITH PROPOFOL  N/A 08/23/2022   Procedure: COLONOSCOPY WITH PROPOFOL ;  Surgeon: Onita Elspeth Sharper, DO;  Location: [facility] ENDOSCOPY;  Service: Gastroenterology;  Laterality: N/A;   COLONOSCOPY WITH PROPOFOL  N/A 02/28/2023   Procedure: COLONOSCOPY WITH PROPOFOL ;  Surgeon: Onita Elspeth Sharper, DO;  Location: [facility] ENDOSCOPY;  Service: Gastroenterology;  Laterality: N/A;  DM   COLONOSCOPY WITH PROPOFOL  N/A 04/25/2023   Procedure: COLONOSCOPY WITH PROPOFOL ;  Surgeon: Onita Elspeth Sharper, DO;  Location: [facility] ENDOSCOPY;  Service: Gastroenterology;  Laterality: N/A;  DM   ESOPHAGOGASTRODUODENOSCOPY (EGD) WITH PROPOFOL  N/A 08/21/2022   Procedure: ESOPHAGOGASTRODUODENOSCOPY (EGD) WITH PROPOFOL ;  Surgeon: Onita Elspeth Sharper, DO;  Location: [facility] ENDOSCOPY;  Service: Gastroenterology;  Laterality: N/A;   ESOPHAGOGASTRODUODENOSCOPY (EGD) WITH PROPOFOL  N/A 02/28/2023   Procedure: ESOPHAGOGASTRODUODENOSCOPY (EGD) WITH PROPOFOL ;  Surgeon: Onita Elspeth Sharper, DO;  Location: [facility] ENDOSCOPY;  Service:  Gastroenterology;  Laterality: N/A;   GIVENS CAPSULE STUDY N/A 08/23/2022   Procedure: GIVENS CAPSULE STUDY;  Surgeon: Onita Elspeth Sharper, DO;  Location: [facility] ENDOSCOPY;  Service: Gastroenterology;  Laterality: N/A;   HERNIA REPAIR     INGUINAL HERNIA REPAIR Right 03/09/2023   Procedure: HERNIA REPAIR INGUINAL ADULT, open, RNFA to assist;  Surgeon: Jordis Laneta FALCON, MD;  Location: [facility] ORS;  Service: General;  Laterality: Right;   IR BONE MARROW BIOPSY & ASPIRATION  08/19/2023   IR US  GUIDE BX ASP/DRAIN  06/27/2023   IRRIGATION AND DEBRIDEMENT ABSCESS Left 08/28/2022   Procedure: IRRIGATION AND DEBRIDEMENT ABSCESS LEFT UPPER THIGH/GROIN;  Surgeon: Jordis Laneta FALCON, MD;  Location: [facility] ORS;  Service: General;  Laterality: Left;   LAPAROSCOPIC RIGHT COLECTOMY Right 09/28/2022   Procedure: LAPAROSCOPIC RIGHT COLECTOMY, RNFA to assist;  Surgeon: Jordis Laneta FALCON, MD;  Location: [facility] ORS;  Service: General;  Laterality: Right;   POLYPECTOMY  08/23/2022   Procedure: POLYPECTOMY;  Surgeon: Onita Elspeth Sharper, DO;  Location: [facility] ENDOSCOPY;  Service: Gastroenterology;;   POLYPECTOMY  04/25/2023   Procedure: POLYPECTOMY;  Surgeon: Onita,  Elspeth Sharper, DO;  Location: [facility] ENDOSCOPY;  Service: Gastroenterology;;   SUBMUCOSAL TATTOO INJECTION  08/23/2022   Procedure: SUBMUCOSAL TATTOO INJECTION;  Surgeon: Onita Elspeth Sharper, DO;  Location: [facility] ENDOSCOPY;  Service: Gastroenterology;;   TONSILLECTOMY     VENTRAL HERNIA REPAIR N/A 09/28/2022   Procedure: HERNIA REPAIR VENTRAL ADULT;  Surgeon: Jordis Laneta FALCON, MD;  Location: [facility] ORS;  Service: General;  Laterality: N/A;     reports that he has quit smoking. His smoking use included cigarettes. He has been exposed to tobacco smoke. He has never used smokeless tobacco. He reports that he does not drink alcohol and does not use drugs.  No Known Allergies  Family History  Problem Relation Age of Onset   Cancer Maternal Aunt    ADD / ADHD Daughter     Asthma Son    Prostate cancer Neg Hx    Kidney disease Neg Hx    Kidney cancer Neg Hx    Bladder Cancer Neg Hx      Prior to Admission medications   Medication Sig Start Date End Date Taking? Authorizing Provider  acetaminophen  (TYLENOL ) 325 MG tablet Take 2 tablets (650 mg total) by mouth every 6 (six) hours as needed for mild pain (pain score 1-3) or fever. Patient not taking: Reported on 11/10/2023 06/28/23   Fausto Burnard LABOR, DO  albuterol  (VENTOLIN  HFA) 108 (90 Base) MCG/ACT inhaler Inhale 2 puffs into the lungs every 4 (four) hours as needed for wheezing or shortness of breath. Patient not taking: Reported on 11/10/2023 04/28/23 04/27/24  [provider]  amoxicillin -clavulanate (AUGMENTIN ) 875-125 MG tablet Take 1 tablet by mouth 2 (two) times daily. 11/10/23   Borders, Fonda SAUNDERS, NP  aspirin  EC 81 MG tablet Take 1 tablet (81 mg total) by mouth daily. Swallow whole. 08/09/22   Furth, Cadence H, PA-C  DM-Doxylamine-Acetaminophen  (NYQUIL COLD & FLU PO) Take by mouth as needed.    [provider]  empagliflozin  (JARDIANCE ) 10 MG TABS tablet Take 1 tablet (10 mg total) by mouth daily before breakfast. 05/24/23   Bensimhon, Toribio SAUNDERS, MD  fluticasone -salmeterol (ADVAIR) 250-50 MCG/ACT AEPB Inhale 1 puff into the lungs in the morning and at bedtime. Patient not taking: Reported on 11/10/2023 04/12/23   [provider]  furosemide  (LASIX ) 20 MG tablet Take 20 mg by mouth daily as needed. Patient not taking: Reported on 11/10/2023 06/19/23   [provider]  hydrALAZINE  (APRESOLINE ) 50 MG tablet Take 2 tablets (100 mg total) by mouth 2 (two) times daily. 05/05/23   Bensimhon, Daniel R, MD  ipratropium (ATROVENT) 0.03 % nasal spray Place 2 sprays into the nose 2 (two) times daily. Patient not taking: Reported on 11/10/2023 04/28/23 04/27/24  [provider]  levothyroxine  (SYNTHROID ) 200 MCG tablet Take 1 tablet (200 mcg total) by mouth daily before breakfast.  12/27/22   Emilio Kelly DASEN, FNP  metFORMIN  (GLUCOPHAGE ) 500 MG tablet Take 2 tablets (1,000 mg total) by mouth 2 (two) times daily with a meal. 06/28/23   Fausto Burnard LABOR, DO  metoprolol  succinate (TOPROL  XL) 25 MG 24 hr tablet Take 1 tablet (25 mg total) by mouth daily. Patient not taking: Reported on 11/10/2023 05/05/23   Bensimhon, Toribio SAUNDERS, MD  oxyCODONE -acetaminophen  (PERCOCET) 10-325 MG tablet Take 1 tablet by mouth every 8 (eight) hours as needed for pain. Must last 30 days. 11/16/23 12/16/23  Patel, Seema K, NP  rosuvastatin  (CRESTOR ) 40 MG tablet Take 1 tablet (40 mg total) by mouth daily.  Patient not taking: Reported on 11/10/2023 05/05/23 11/01/23  Bensimhon, Toribio SAUNDERS, MD  sacubitril -valsartan  (ENTRESTO ) 97-103 MG Take 1 tablet by mouth 2 (two) times daily. 05/24/23   Bensimhon, Toribio SAUNDERS, MD  spironolactone  (ALDACTONE ) 25 MG tablet Take 0.5 tablets (12.5 mg total) by mouth daily. Patient not taking: Reported on 11/10/2023 05/05/23 11/01/23  Bensimhon, Toribio SAUNDERS, MD  zanubrutinib  (BRUKINSA ) 80 MG capsule Take 2 capsules (160 mg total) by mouth 2 (two) times daily. Patient not taking: Reported on 11/10/2023 03/17/23   Babara Call, MD    Physical Exam: Vitals:   11/21/23 1246 11/21/23 1300 11/21/23 1400 11/21/23 1430  BP:  (!) 149/85 (!) 154/83 (!) 150/81  Pulse:  69 77 71  Resp:  (!) 25 20 15   Temp:      TempSrc:      SpO2:  100% 95% 95%  Weight: 95.3 kg     Height: 6' 1 (1.854 m)       Constitutional: NAD, calm, comfortable Vitals:   11/21/23 1246 11/21/23 1300 11/21/23 1400 11/21/23 1430  BP:  (!) 149/85 (!) 154/83 (!) 150/81  Pulse:  69 77 71  Resp:  (!) 25 20 15   Temp:      TempSrc:      SpO2:  100% 95% 95%  Weight: 95.3 kg     Height: 6' 1 (1.854 m)      Eyes: PERRL, lids and conjunctivae normal ENMT: Mucous membranes are moist. Posterior pharynx clear of any exudate or lesions.Normal dentition.  Neck: normal, supple, no masses, no thyromegaly Respiratory: Diminished  breathing sound bilaterally, diffused crackle and wheezing bilaterally, increasing breathing effort. No accessory muscle use.  Cardiovascular: Regular rate and rhythm, no murmurs / rubs / gallops.  1+ extremity edema. 2+ pedal pulses. No carotid bruits.  Abdomen: no tenderness, no masses palpated. No hepatosplenomegaly. Bowel sounds positive.  Musculoskeletal: no clubbing / cyanosis. No joint deformity upper and lower extremities. Good ROM, no contractures. Normal muscle tone.  Skin: no rashes, lesions, ulcers. No induration Neurologic: CN 2-12 grossly intact. Sensation intact, DTR normal. Strength 5/5 in all 4.  Psychiatric: Normal judgment and insight. Alert and oriented x 3. Normal mood.    Labs on Admission: I have personally reviewed following labs and imaging studies  CBC: Recent Labs  Lab 11/21/23 1235  WBC 10.6*  HGB 9.9*  HCT 32.5*  MCV 91.8  PLT 246   Basic Metabolic Panel: Recent Labs  Lab 11/21/23 1235  NA 138  K 4.0  CL 107  CO2 22  GLUCOSE 103*  BUN 18  CREATININE 1.00  CALCIUM  8.7*   GFR: Estimated Creatinine Clearance: 69.9 mL/min (by C-G formula based on SCr of 1 mg/dL). Liver Function Tests: No results for input(s): AST, ALT, ALKPHOS, BILITOT, PROT, ALBUMIN in the last 168 hours. No results for input(s): LIPASE, AMYLASE in the last 168 hours. No results for input(s): AMMONIA in the last 168 hours. Coagulation Profile: No results for input(s): INR, PROTIME in the last 168 hours. Cardiac Enzymes: No results for input(s): CKTOTAL, CKMB, CKMBINDEX, TROPONINI in the last 168 hours. BNP (last 3 results) No results for input(s): PROBNP in the last 8760 hours. HbA1C: No results for input(s): HGBA1C in the last 72 hours. CBG: No results for input(s): GLUCAP in the last 168 hours. Lipid Profile: No results for input(s): CHOL, HDL, LDLCALC, TRIG, CHOLHDL, LDLDIRECT in the last 72 hours. Thyroid  Function  Tests: No results for input(s): TSH, T4TOTAL, FREET4, T3FREE, THYROIDAB in the  last 72 hours. Anemia Panel: No results for input(s): VITAMINB12, FOLATE, FERRITIN, TIBC, IRON , RETICCTPCT in the last 72 hours. Urine analysis:    Component Value Date/Time   COLORURINE YELLOW (A) 10/01/2023 0129   APPEARANCEUR CLEAR (A) 10/01/2023 0129   APPEARANCEUR Clear 10/04/2019 1128   LABSPEC 1.024 10/01/2023 0129   LABSPEC 1.019 06/16/2013 1607   PHURINE 5.0 10/01/2023 0129   GLUCOSEU >=500 (A) 10/01/2023 0129   GLUCOSEU Negative 06/16/2013 1607   HGBUR SMALL (A) 10/01/2023 0129   BILIRUBINUR NEGATIVE 10/01/2023 0129   BILIRUBINUR Negative 07/24/2020 1013   BILIRUBINUR Negative 10/04/2019 1128   BILIRUBINUR Negative 06/16/2013 1607   KETONESUR NEGATIVE 10/01/2023 0129   PROTEINUR 30 (A) 10/01/2023 0129   UROBILINOGEN 0.2 07/24/2020 1013   NITRITE NEGATIVE 10/01/2023 0129   LEUKOCYTESUR NEGATIVE 10/01/2023 0129   LEUKOCYTESUR Negative 06/16/2013 1607    Radiological Exams on Admission: DG Chest Portable 1 View Result Date: 11/21/2023 EXAM: 1 VIEW(S) XRAY OF THE CHEST 11/21/2023 01:01:44 PM COMPARISON: None available. CLINICAL HISTORY: sob. Shortness of breath FINDINGS: LUNGS AND PLEURA: Retrocardiac opacification of the left lower lobe. Mild pulmonary edema. Trace bilateral pleural effusions. No pneumothorax. HEART AND MEDIASTINUM: Cardiomegaly. Atherosclerotic plaque. BONES AND SOFT TISSUES: No acute osseous abnormality. IMPRESSION: 1. Mild interstitial edema and bilateral pleural effusions concerning for mild chf. 2. Decreased aeration to the left base which may reflect retrocardiac atelectasis or airspace consolidation. Correlate for any signs or symptoms of pneumonia. Electronically signed by: Waddell Calk MD 11/21/2023 02:45 PM EDT RP Workstation: [id number]    EKG: Independently reviewed.  Sinus, chronic LBBB  Assessment/Plan Principal Problem:   CHF (congestive  heart failure) (HCC) Active Problems:   Acute on chronic combined systolic and diastolic CHF (congestive heart failure) (HCC)  (please populate well all problems here in Problem List. (For example, if patient is on BP meds at home and you resume or decide to hold them, it is a problem that needs to be her. Same for CAD, COPD, HLD and so on)  Acute on chronic combined HFrEF and HFpEF decompensation - Patient has both symptoms and signs of fluid overload, continue Lasix  IV 40 mg twice daily - Echocardiogram - Other DDx, other possible diagnoses including possible COPD Gold stage I with acute decompensation, review of patient's outpatient pulmonology records showed patient had a abnormal PFT this year, showing 100% FEV1, but decreased lung capacity and DLCO indicating  reduced lung expansion and oxygen  transfer.  Given that patient has a remote smoking history, wonder if patient has COPD, we will send prescription of albuterol  inhaler to [facility] pharmacy and recommend patient outpatient follow-up with pulmonary.  As patient was treated with antibiotics not long ago, will not repeat antibiotic treatment.  Placed patient on breathing treatment, ICS and LABA, incentive spirometry and flutter valve, short course of steroids.  HTN, uncontrolled - Continue Entresto , continue twice daily hydralazine  - GI metoprolol  to Coreg  CLL - No acute cancer  Hypothyroidism - Continue Synthroid   IIDM -SSI for now  DVT prophylaxis: Lovenox  Code Status: Full code Family Communication: None at bedside Disposition Plan: Expect less than 2 midnight hospital stay Consults called: None Admission status: Telemetry observation   Cort ONEIDA Mana MD [facility] Pager [phone]  11/21/2023, 3:41 PM

## 2023-11-21 NOTE — ED Notes (Signed)
Pt ambulatory to toilet

## 2023-11-21 NOTE — ED Provider Notes (Signed)
 [facility] Provider Note    Event Date/Time   First MD Initiated Contact with Patient 11/21/23 1250     (approximate)  History   Chief Complaint: Shortness of Breath  HPI  Paul Cato [name] is a 78 y.o. male with a past medical history of anemia, CAD, CKD, diabetes, hypertension who presents to the emergency department for 2 weeks of shortness of breath and cough.  According to the patient for last 2 weeks he has been coughing what seems to be a wet sounding cough but no sputum production.  No fever.  Patient states shortness of breath has been worsening.  Shortness of breath is worse with exertion.  Patient denies any chest pain at any point.  Has some mild lower extremity edema but states this is about baseline for him.  Physical Exam   Triage Vital Signs: ED Triage Vitals  Encounter Vitals Group     BP 11/21/23 1242 (!) 160/124     Girls Systolic BP Percentile --      Girls Diastolic BP Percentile --      Boys Systolic BP Percentile --      Boys Diastolic BP Percentile --      Pulse Rate 11/21/23 1242 95     Resp 11/21/23 1242 (!) 34     Temp 11/21/23 1242 97.9 F (36.6 C)     Temp Source 11/21/23 1242 Oral     SpO2 11/21/23 1241 99 %     Weight 11/21/23 1246 210 lb (95.3 kg)     Height 11/21/23 1246 6' 1 (1.854 m)     Head Circumference --      Peak Flow --      Pain Score 11/21/23 1241 0     Pain Loc --      Pain Education --      Exclude from Growth Chart --     Most recent vital signs: Vitals:   11/21/23 1241 11/21/23 1242  BP:  (!) 160/124  Pulse:  95  Resp:  (!) 34  Temp:  97.9 F (36.6 C)  SpO2: 99% 99%    General: Awake, no distress.  CV:  Good peripheral perfusion.  Regular rate and rhythm  Resp:  Mild tachypnea, equal breath sounds bilaterally.  Mild to moderate rhonchi bilaterally right greater than left. Abd:  No distention.  Soft, nontender.  No rebound or guarding.  ED Results / Procedures / Treatments    EKG  EKG viewed and interpreted by myself shows a normal sinus rhythm at 81 bpm with a slightly widened QRS, normal axis, slight PR prolongation otherwise normal intervals nonspecific ST changes without ST elevation.  RADIOLOGY  I have reviewed interpret the chest x-ray images.  There is mild opacities bilaterally possibly consistent with pulmonary edema. Radiology has read the x-ray as interstitial edema with bilateral effusions concerning for mild CHF.   MEDICATIONS ORDERED IN ED: Medications  ipratropium-albuterol  (DUONEB) 0.5-2.5 (3) MG/3ML nebulizer solution 3 mL (has no administration in time range)  ipratropium-albuterol  (DUONEB) 0.5-2.5 (3) MG/3ML nebulizer solution 3 mL (has no administration in time range)     IMPRESSION / MDM / ASSESSMENT AND PLAN / ED COURSE  I reviewed the triage vital signs and the nursing notes.  Patient's presentation is most consistent with acute presentation with potential threat to life or bodily function.  Patient presents to the emergency department for shortness of breath and cough.  Patient does have rhonchi on exam.  We  will check labs including a CBC chemistry troponin and BNP.  Will obtain a chest x-ray as well as a COVID/flu swab.  Patient does have rhonchi and slight expiratory wheeze bilaterally will treat with DuoNebs while awaiting results.  Patient agreeable to plan of care.  Reassuringly patient is satting 99 to 100% on room air.  No baseline O2 requirement.  Patient is noted to be hypertensive 160/120 we will dose hydralazine  while awaiting further results.  Patient has lab work shows a reassuring CBC, reassuring chemistry.  Troponin slightly elevated at 42 although not largely changed from historical values.  BNP is elevated to 1800.  Respiratory panel negative but chest x-ray consistent with pulmonary edema.  Patient currently satting in the low to mid 90s on room air and given the patient's increased respiratory rate inability to  ambulate due to shortness of breath with exertion and inability to lay down due to orthopnea highly suspect CHF exacerbation.  Will dose IV Lasix  will admit to the hospital service for further workup and treatment.  Patient agreeable to plan of care.  FINAL CLINICAL IMPRESSION(S) / ED DIAGNOSES   Dyspnea CHF exacerbation    Note:  This document was prepared using Dragon voice recognition software and may include unintentional dictation errors.   Dorothyann Drivers, MD 11/21/23 [phone]

## 2023-11-21 NOTE — ED Triage Notes (Addendum)
 Pt comes with c/o sob, labored breathing and accessory muscle use. Pt struggling to breath. Pt states this started two weeks ago. Pt taken straight to ED 3  Pt placed on monitor.   Pt denies any cp. Pt states trouble breathing and taking in breath. Pt state worse at night when laying down. Pt ahs hx of chf. Pt has swelling to bilateral lower legs.

## 2023-11-22 ENCOUNTER — Telehealth: Payer: Self-pay | Admitting: Nurse Practitioner

## 2023-11-22 ENCOUNTER — Telehealth: Payer: Self-pay | Admitting: *Deleted

## 2023-11-22 ENCOUNTER — Observation Stay (HOSPITAL_COMMUNITY)
Admit: 2023-11-22 | Discharge: 2023-11-22 | Disposition: A | Discharge status: Home / Self Care | Attending: Internal Medicine | Admitting: Internal Medicine

## 2023-11-22 DIAGNOSIS — N1831 Chronic kidney disease, stage 3a: Secondary | ICD-10-CM | POA: Diagnosis present

## 2023-11-22 DIAGNOSIS — Z7951 Long term (current) use of inhaled steroids: Secondary | ICD-10-CM | POA: Diagnosis not present

## 2023-11-22 DIAGNOSIS — E039 Hypothyroidism, unspecified: Secondary | ICD-10-CM | POA: Diagnosis present

## 2023-11-22 DIAGNOSIS — I509 Heart failure, unspecified: Secondary | ICD-10-CM | POA: Diagnosis present

## 2023-11-22 DIAGNOSIS — E785 Hyperlipidemia, unspecified: Secondary | ICD-10-CM | POA: Diagnosis present

## 2023-11-22 DIAGNOSIS — I5021 Acute systolic (congestive) heart failure: Secondary | ICD-10-CM

## 2023-11-22 DIAGNOSIS — Z1152 Encounter for screening for COVID-19: Secondary | ICD-10-CM | POA: Diagnosis not present

## 2023-11-22 DIAGNOSIS — J441 Chronic obstructive pulmonary disease with (acute) exacerbation: Secondary | ICD-10-CM | POA: Diagnosis present

## 2023-11-22 DIAGNOSIS — G4733 Obstructive sleep apnea (adult) (pediatric): Secondary | ICD-10-CM | POA: Diagnosis present

## 2023-11-22 DIAGNOSIS — N400 Enlarged prostate without lower urinary tract symptoms: Secondary | ICD-10-CM | POA: Diagnosis present

## 2023-11-22 DIAGNOSIS — G894 Chronic pain syndrome: Secondary | ICD-10-CM | POA: Diagnosis present

## 2023-11-22 DIAGNOSIS — I5043 Acute on chronic combined systolic (congestive) and diastolic (congestive) heart failure: Secondary | ICD-10-CM | POA: Diagnosis present

## 2023-11-22 DIAGNOSIS — Z7984 Long term (current) use of oral hypoglycemic drugs: Secondary | ICD-10-CM | POA: Diagnosis not present

## 2023-11-22 DIAGNOSIS — L0231 Cutaneous abscess of buttock: Secondary | ICD-10-CM | POA: Diagnosis present

## 2023-11-22 DIAGNOSIS — D3502 Benign neoplasm of left adrenal gland: Secondary | ICD-10-CM | POA: Diagnosis present

## 2023-11-22 DIAGNOSIS — D631 Anemia in chronic kidney disease: Secondary | ICD-10-CM | POA: Diagnosis present

## 2023-11-22 DIAGNOSIS — L03317 Cellulitis of buttock: Secondary | ICD-10-CM | POA: Diagnosis present

## 2023-11-22 DIAGNOSIS — C9110 Chronic lymphocytic leukemia of B-cell type not having achieved remission: Secondary | ICD-10-CM | POA: Diagnosis present

## 2023-11-22 DIAGNOSIS — E1122 Type 2 diabetes mellitus with diabetic chronic kidney disease: Secondary | ICD-10-CM | POA: Diagnosis present

## 2023-11-22 DIAGNOSIS — Z7982 Long term (current) use of aspirin: Secondary | ICD-10-CM | POA: Diagnosis not present

## 2023-11-22 DIAGNOSIS — I3139 Other pericardial effusion (noninflammatory): Secondary | ICD-10-CM | POA: Diagnosis present

## 2023-11-22 DIAGNOSIS — I77810 Thoracic aortic ectasia: Secondary | ICD-10-CM | POA: Diagnosis present

## 2023-11-22 DIAGNOSIS — I130 Hypertensive heart and chronic kidney disease with heart failure and stage 1 through stage 4 chronic kidney disease, or unspecified chronic kidney disease: Secondary | ICD-10-CM | POA: Diagnosis present

## 2023-11-22 DIAGNOSIS — M549 Dorsalgia, unspecified: Secondary | ICD-10-CM | POA: Diagnosis present

## 2023-11-22 DIAGNOSIS — I428 Other cardiomyopathies: Secondary | ICD-10-CM | POA: Diagnosis present

## 2023-11-22 DIAGNOSIS — I2510 Atherosclerotic heart disease of native coronary artery without angina pectoris: Secondary | ICD-10-CM | POA: Diagnosis present

## 2023-11-22 LAB — BASIC METABOLIC PANEL WITH GFR
Anion gap: 7 (ref 5–15)
BUN: 18 mg/dL (ref 8–23)
CO2: 25 mmol/L (ref 22–32)
Calcium: 8.1 mg/dL — ABNORMAL LOW (ref 8.9–10.3)
Chloride: 109 mmol/L (ref 98–111)
Creatinine, Ser: 0.94 mg/dL (ref 0.61–1.24)
GFR, Estimated: >60 mL/min (ref >60)
Glucose, Bld: 116 mg/dL — ABNORMAL HIGH (ref 70–99)
Potassium: 3.8 mmol/L (ref 3.5–5.1)
Sodium: 141 mmol/L (ref 135–145)

## 2023-11-22 LAB — ECHOCARDIOGRAM COMPLETE
AR max vel: 3.94 cm2
AV Area VTI: 3.92 cm2
AV Area mean vel: 3.64 cm2
AV Mean grad: 4 mmHg
AV Peak grad: 7.2 mmHg
Ao pk vel: 1.34 m/s
Area-P 1/2: 4.39 cm2
Calc EF: 45.2 %
Height: 73 in
MV VTI: 3.12 cm2
P 1/2 time: 428 ms
S' Lateral: 5.05 cm
Single Plane A2C EF: 53.6 %
Single Plane A4C EF: 27.1 %
Weight: 3178.15 [oz_av]

## 2023-11-22 LAB — PROCALCITONIN: Procalcitonin: <0.1 ng/mL

## 2023-11-22 LAB — GLUCOSE, CAPILLARY
Glucose-Capillary: 115 mg/dL — ABNORMAL HIGH (ref 70–99)
Glucose-Capillary: 166 mg/dL — ABNORMAL HIGH (ref 70–99)
Glucose-Capillary: 158 mg/dL — ABNORMAL HIGH (ref 70–99)
Glucose-Capillary: 152 mg/dL — ABNORMAL HIGH (ref 70–99)

## 2023-11-22 LAB — MAGNESIUM: Magnesium: 2.2 mg/dL (ref 1.7–2.4)

## 2023-11-22 LAB — VITAMIN D 25 HYDROXY (VIT D DEFICIENCY, FRACTURES): Vit D, 25-Hydroxy: 37.52 ng/mL (ref 30–100)

## 2023-11-22 LAB — HEMOGLOBIN A1C
Hgb A1c MFr Bld: 5.5 % (ref 4.8–5.6)
Mean Plasma Glucose: 111.15 mg/dL

## 2023-11-22 LAB — PHOSPHORUS: Phosphorus: 2.5 mg/dL (ref 2.5–4.6)

## 2023-11-22 LAB — BRAIN NATRIURETIC PEPTIDE: B Natriuretic Peptide: 1857.1 pg/mL — ABNORMAL HIGH (ref 0.0–100.0)

## 2023-11-22 LAB — C-REACTIVE PROTEIN: CRP: 1 mg/dL — ABNORMAL HIGH (ref <1.0)

## 2023-11-22 MED ORDER — HYDROCOD POLI-CHLORPHE POLI ER 10-8 MG/5ML PO SUER
5.0000 mL | Freq: Once | ORAL | Status: AC
  Administered 2023-11-22: 5 mL via ORAL
  Filled 2023-11-22: qty 5

## 2023-11-22 MED ORDER — EMPAGLIFLOZIN 10 MG PO TABS
10.0000 mg | ORAL_TABLET | Freq: Every day | ORAL | Status: DC
  Administered 2023-11-23 – 2023-11-29 (×7): 10 mg via ORAL
  Filled 2023-11-22 (×7): qty 1

## 2023-11-22 MED ORDER — GUAIFENESIN ER 600 MG PO TB12
600.0000 mg | ORAL_TABLET | Freq: Two times a day (BID) | ORAL | Status: DC
  Administered 2023-11-22 – 2023-11-29 (×14): 600 mg via ORAL
  Filled 2023-11-22 (×15): qty 1

## 2023-11-22 MED ORDER — IPRATROPIUM-ALBUTEROL 0.5-2.5 (3) MG/3ML IN SOLN: 3.0000 mL | Freq: Four times a day (QID) | RESPIRATORY_TRACT | Status: DC | PRN

## 2023-11-22 MED ORDER — LIVING BETTER WITH HEART FAILURE BOOK
Freq: Once | Status: AC
  Administered 2023-11-22: 1

## 2023-11-22 MED ORDER — METOPROLOL SUCCINATE ER 25 MG PO TB24
25.0000 mg | ORAL_TABLET | Freq: Every day | ORAL | Status: DC
  Administered 2023-11-23 – 2023-11-29 (×7): 25 mg via ORAL
  Filled 2023-11-22 (×7): qty 1

## 2023-11-22 MED ORDER — SPIRONOLACTONE 12.5 MG HALF TABLET
12.5000 mg | ORAL_TABLET | Freq: Every day | ORAL | Status: DC
  Administered 2023-11-22 – 2023-11-29 (×8): 12.5 mg via ORAL
  Filled 2023-11-22 (×8): qty 1

## 2023-11-22 MED ORDER — HYDROCOD POLI-CHLORPHE POLI ER 10-8 MG/5ML PO SUER
5.0000 mL | Freq: Two times a day (BID) | ORAL | Status: DC | PRN
  Administered 2023-11-25 – 2023-11-28 (×2): 5 mL via ORAL
  Filled 2023-11-22 (×2): qty 5

## 2023-11-22 MED ORDER — AZITHROMYCIN 250 MG PO TABS
500.0000 mg | ORAL_TABLET | Freq: Every day | ORAL | Status: AC
Start: 2023-11-22 — End: 2023-11-25
  Administered 2023-11-22 – 2023-11-24 (×3): 500 mg via ORAL
  Filled 2023-11-22 (×3): qty 2

## 2023-11-22 MED ORDER — PREDNISONE 20 MG PO TABS: 40.0000 mg | ORAL_TABLET | Freq: Every day | ORAL | Status: DC

## 2023-11-22 MED ORDER — LORATADINE 10 MG PO TABS
10.0000 mg | ORAL_TABLET | Freq: Every day | ORAL | Status: DC
  Administered 2023-11-22 – 2023-11-29 (×8): 10 mg via ORAL
  Filled 2023-11-22 (×8): qty 1

## 2023-11-22 NOTE — Telephone Encounter (Signed)
 Pharmacy told patient he does not have any scripts to fill. Please check with pharmacy and let patient's daughter know so she can pick them up for him.

## 2023-11-22 NOTE — Telephone Encounter (Signed)
 Attempted to call patient's daughter at the number in the chart. Message left.

## 2023-11-22 NOTE — Care Management Obs Status (Signed)
 MEDICARE OBSERVATION STATUS NOTIFICATION   Patient Details  Name: Paul Alberta [name] MRN: [id number] Date of Birth: 11/29/1945   Medicare Observation Status Notification Given:  Yes    [name] 11/22/2023, 12:44 PM

## 2023-11-22 NOTE — Plan of Care (Signed)

## 2023-11-22 NOTE — Telephone Encounter (Signed)
 The daughter says that she do not know when it is going to be with if he can get out and we have a set time here at the cancer center so I will look into it and then we will see whether what he is able to come tomorrow or put it out and give us  a new appointment.  She is okay with that and I will put that down to check on

## 2023-11-22 NOTE — Plan of Care (Signed)
  Problem: Fluid Volume: Goal: Ability to maintain a balanced intake and output will improve Outcome: Progressing   Problem: Activity: Goal: Capacity to carry out activities will improve Outcome: Progressing   Problem: Clinical Measurements: Goal: Respiratory complications will improve Outcome: Progressing

## 2023-11-22 NOTE — Progress Notes (Signed)
 [facility] Progress Note  Patient: Paul Briggs    FMW:[id number]  DOA: 11/21/2023     Date of Service: the patient was seen and examined on 11/22/2023  Chief Complaint  Patient presents with   Shortness of Breath   Brief hospital course: Connell Bognar [name] is a 78 y.o. male with medical history significant of chronic combined HFrEF and HFpEF, CLL, HTN, HLD, IIDM, hypothyroidism, CKD stage IIIa, BPH, SDH, OSA not on CPAP, chronic pain and narcotic dependence, COPD Gold stage I?  Presented with worsening of cough wheezing shortness of breath.   Symptoms started about 2 weeks ago, patient started to have a productive cough with whitish sputum production, and wheezing and increasing exertional dyspnea.  Was prescribed with 5 days course of Augmentin , however despite taking antibiotics his symptoms did not significantly improved.  Denied any chest pain no fever or chills.  Last few days he also found significant orthopnea.  He does weigh himself and found his weight has been  fluctuating but denied any ankle swelling.   ED Course: Afebrile, blood pressure elevated 160/85 ultrafiltration 99% on room air.  Chest x-ray showed interstitial edema, blood work showed BUN 18 creatinine 1.0 potassium 4.0 glucose 103 bicarb 22, WBC 10.6, hemoglobin 9.9.   Patient was given IV Lasix  60 mg x 1 in the ED.   Assessment and Plan:   # Acute on chronic combined systolic and diastolic heart failure exacerbation  - Patient has both symptoms and signs of fluid overload, continue Lasix  IV 40 mg twice daily - Echocardiogram Resumed Toprol -XL 25 mg, Aldactone  12.5 mg, Entresto  and hydralazine  home medication Med rec shows patient is not taking, unknown reason.  Need to follow-up with PCP and cardiology for further management as an outpatient.   # COPD exacerbation Patient presented with persistent cough for more than 2 weeks Continue prednisone 40 mg p.o. daily for 5 days Started  azithromycin 500 mg p.o. daily for 3 Started Mucinex  600 twice daily Claritin 10 mg p.o. daily Tussionex prn and Tessalon pearls prn for cough Continue Breztri inhaler Continue DuoNeb every 6 hourly as needed Follow pulmonary consult    HTN, uncontrolled - Continue Entresto , continue twice daily hydralazine  - Toprol -XL 25 mg    H/o CLL - No acute cancer   Hypothyroidism - Continue Synthroid    NIDDM T2, hold metformin  for now  -Continue NovoLog  sliding  Monitor CBG and continue diabetic diet    Body mass index is 26.03 kg/m.  Interventions:   Diet: Diabetic diet DVT Prophylaxis: Subcutaneous Lovenox    Advance goals of care discussion: Full code  Family Communication: family was not present at bedside, at the time of interview.  The pt provided permission to discuss medical plan with the family. Opportunity was given to ask question and all questions were answered satisfactorily.   Disposition:  Pt is from home, admitted with CHF and COPD exacerbation, still has shortness of breath and cough, which precludes a safe discharge. Discharge to home, when stable, most likely in 1 to 2 days.  Subjective: No significant events overnight, patient still has dyspnea on exertion, some shortness of breath and cough with some phlegm. Patient feels little bit improvement in the shortness of breath, denies any chest pain or palpitation, no any other complaints   Physical Exam: General: NAD, lying comfortably, persistent dry cough Appear in no distress, affect appropriate Eyes: PERRLA ENT: Oral Mucosa Clear, moist  Neck: no JVD,  Cardiovascular: S1 and S2 Present, no  Murmur,  Respiratory: good respiratory effort, Bilateral Air entry equal and Decreased, mild basal Crackles, minimal wheezes Abdomen: Bowel Sound present, Soft and no tenderness,  Skin: no rashes Extremities: no Pedal edema, no calf tenderness Neurologic: without any new focal findings Gait not checked due to  patient safety concerns  Vitals:   11/22/23 0809 11/22/23 0818 11/22/23 1037 11/22/23 1209  BP: (!) 158/86   137/79  Pulse: 69 68  65  Resp: 15 16  14   Temp: 98.1 F (36.7 C)   98.1 F (36.7 C)  TempSrc:      SpO2: 98% 97%  100%  Weight:   89.5 kg   Height:        Intake/Output Summary (Last 24 hours) at 11/22/2023 1414 Last data filed at 11/22/2023 1203 Gross per 24 hour  Intake --  Output 4725 ml  Net -4725 ml   Filed Weights   11/21/23 1246 11/22/23 0451 11/22/23 1037  Weight: 95.3 kg 90.1 kg 89.5 kg    Data Reviewed: I have personally reviewed and interpreted daily labs, tele strips, imagings as discussed above. I reviewed all nursing notes, pharmacy notes, vitals, pertinent old records I have discussed plan of care as described above with RN and patient/family.  CBC: Recent Labs  Lab 11/21/23 1235  WBC 10.6*  HGB 9.9*  HCT 32.5*  MCV 91.8  PLT 246   Basic Metabolic Panel: Recent Labs  Lab 11/21/23 1235 11/22/23 0535 11/22/23 0947  NA 138 141  --   K 4.0 3.8  --   CL 107 109  --   CO2 22 25  --   GLUCOSE 103* 116*  --   BUN 18 18  --   CREATININE 1.00 0.94  --   CALCIUM  8.7* 8.1*  --   MG  --   --  2.2  PHOS  --   --  2.5    Studies: No results found.  Scheduled Meds:  aspirin  EC  81 mg Oral Daily   azithromycin  500 mg Oral Daily   budesonide-glycopyrrolate -formoterol  2 puff Inhalation BID   enoxaparin  (LOVENOX ) injection  40 mg Subcutaneous QHS   furosemide   40 mg Intravenous BID   guaiFENesin   600 mg Oral BID   hydrALAZINE   10 mg Intravenous Once   hydrALAZINE   100 mg Oral BID   insulin  aspart  0-15 Units Subcutaneous TID WC   levothyroxine   200 mcg Oral Q0600   loratadine  10 mg Oral Daily   mirtazapine  7.5 mg Oral QHS   predniSONE  40 mg Oral Q breakfast   sacubitril -valsartan   1 tablet Oral BID   sodium chloride  flush  3 mL Intravenous Q12H   Continuous Infusions:  sodium chloride      PRN Meds: sodium chloride , oxyCODONE   **AND** acetaminophen , acetaminophen , benzonatate, chlorpheniramine-HYDROcodone , ipratropium-albuterol , ondansetron  (ZOFRAN ) IV, sodium chloride  flush  Time spent: 55 minutes  Author: ELVAN SOR. MD [facility] 11/22/2023 2:14 PM  To reach On-call, see care teams to locate the attending and reach out to them via [email or web address]. If 7PM-7AM, please contact night-coverage If you still have difficulty reaching the attending provider, please page the [facility] (Director on Call) for [facility] on amion for assistance.

## 2023-11-22 NOTE — Progress Notes (Signed)
 Pt and Pts daughter would like a referral to see Pulmonologist.

## 2023-11-22 NOTE — Telephone Encounter (Signed)
 [facility], they verified that they do have a prescription for Oxy/Acet, will fill it.  Patient's daughter notified.

## 2023-11-22 NOTE — Progress Notes (Signed)
 Heart Failure Stewardship Pharmacy Note  PCP: Cyrus Selinda Moose, PA-C PCP-Cardiologist: Redell Cave, MD  HPI: Paul Gellatly [name] is a 78 y.o. 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 On admission, BNP was 1856.6, HS-troponin was 42, and A1c 5.5. Chest x-ray noted mild interstitial edema and bilateral pleural effusions concerning for mild CHF.   Pertinent cardiac history: TTE 07/2022 with LVEF 45-50%. Cardiac CTA 08/2022 showed coronary calcium  score of 14.3, 12th percentile for age and sex matched control, minimal proximal LAD stenosis, overall minimal nonobstructive CAD. cMRI 03/2023 showed EF 43% Mod-sev basal septal hypertrophy (16mm) RVEF 48% No LGE Mild MR, suggestive of non ischemic cardiomyopathy. Possible inflammatory disease involving the basal lateral wall. TTE pending this admission.  Pertinent Lab Values: Creatinine  Date Value Ref Range Status  10/03/2023 1.21 0.61 - 1.24 mg/dL Final   Creat  Date Value Ref Range Status  07/06/2021 1.08 0.70 - 1.28 mg/dL Final   Creatinine, Ser  Date Value Ref Range Status  11/22/2023 0.94 0.61 - 1.24 mg/dL Final   BUN  Date Value Ref Range Status  11/22/2023 18 8 - 23 mg/dL Final  88/72/7975 12 8 - 27 mg/dL Final  90/87/7984 27 (H) 7 - 18 mg/dL Final   Potassium  Date Value Ref Range Status  11/22/2023 3.8 3.5 - 5.1 mmol/L Final  10/06/2013 4.1 3.5 - 5.1 mmol/L Final   Sodium  Date Value Ref Range Status  11/22/2023 141 135 - 145 mmol/L Final  12/22/2022 146 (H) 134 - 144 mmol/L Final  10/06/2013 139 136 - 145 mmol/L Final   B Natriuretic Peptide  Date Value Ref Range Status  11/21/2023 1,856.6 (H) 0.0 - 100.0 pg/mL Final    Comment:    Performed at [facility], [address] [place], [place] [address]    Magnesium  Date Value Ref Range Status  06/26/2023 2.1 1.7 - 2.4 mg/dL Final    Comment:    Performed at [facility], [address] [place], [place] [address]   HbA1c POC (<> result, manual entry)  Date Value Ref Range Status  07/06/2021 6.5 4.0 - 5.6 % Final   Hgb A1c MFr Bld  Date Value Ref Range Status  11/21/2023 5.5 4.8 - 5.6 % Final    Comment:    (NOTE) Diagnosis of Diabetes The following HbA1c ranges recommended by the American Diabetes Association (ADA) may be used as an aid in the diagnosis of diabetes mellitus.  Hemoglobin             Suggested A1C NGSP%              Diagnosis  <5.7                   Non Diabetic  5.7-6.4                Pre-Diabetic  >6.4                   Diabetic  <7.0                   Glycemic control for                       adults with diabetes.     TSH  Date Value Ref Range Status  06/29/2023 28.297 (H) 0.350 - 4.500 uIU/mL Final    Comment:    Performed by a 3rd Generation assay with a functional sensitivity of <=0.01 uIU/mL. Performed at [facility], [address] [place], [place] [address]   12/22/2022 27.300 (H) 0.450 - 4.500 uIU/mL Final   LDH  Date Value Ref Range Status  11/01/2023 285 (H) 98 - 192 U/L Final    Comment:    Performed at [facility], [address] [place], [place] [address]    Vital Signs:  Temp:  [97.9 F (36.6 C)-98.3 F (36.8 C)] 97.9 F (36.6 C) (10/28 0422) Pulse Rate:  [69-95] 70 (10/28 0422) Cardiac Rhythm: Normal sinus rhythm (10/27 2000) Resp:  [15-34] 17 (10/28 0422) BP: (147-160)/(71-124) 148/72 (10/28 0422) SpO2:  [93 %-100 %] 96 % (10/28 0422) Weight:  [90.1 kg (198 lb 10.2 oz)-95.3 kg (210 lb)] 90.1 kg (198 lb 10.2 oz) (10/28 0451)  Intake/Output Summary (Last 24 hours) at 11/22/2023 0744 Last data filed at 11/22/2023 0300 Gross per 24 hour  Intake --  Output 2925 ml  Net -2925 ml    Current Heart Failure Medications:  Loop diuretic:  furosemide  40 mg IV BID Beta-Blocker: none ACEI/ARB/ARNI: Entresto  24-26 mg BID MRA: none SGLT2i: none Other: hydralazine  100 mg BID  Prior to admission Heart Failure Medications:  Loop diuretic: furosemide  20 mg prn Beta-Blocker: metoprolol  succinate 25 mg daily (not taking) ACEI/ARB/ARNI: Entresto  97-103 mg BID (not taking) MRA: spironolactone  25 mg daily (not taking) SGLT2i: Jardiance  10 mg daily Other: hydralazine  100 mg BID  Assessment: 1. Acute on chronic systolic heart failure (LVEF 43%)  , due to NICM. NYHA class III symptoms.  -Symptoms: Reports feeling improved since admission. Still with productive cough. Orthopnea is improving. Appetite is fine. Not short of breath at rest. -Volume: Appears to still be hypervolemic on exam. Trace LEE. JVP up. Creatinine and BUN are stable. Continue IV furosemide  40 mg IV BID.  -Hemodynamics: BP is elevated. -BB: Currently being held due to hypervolemia. Consider resuming tomorrow if nearly euvolemic. -ACEI/ARB/ARNI: Entresto  resumed at lower dose of 24-26 mg BID. Can consider increasing tomorrow pending BP trends. -MRA: Consider adding back PTA spironolactone  25 mg daily.  K <4. -SGLT2i: Consider resuming Jardiance  10 mg daily  Plan: 1) Medication changes recommended at this time: -Consider resuming spironolactone  25 mg daily -Consider resuming Jardiance  10 mg daily  2) Patient assistance: -Pending  3) Education: - Patient has been educated on current HF medications and potential additions to HF medication regimen - Patient verbalizes understanding that over the next few months, these medication doses may change and more medications may be added to optimize HF regimen - Patient has been educated on basic disease state pathophysiology and goals of therapy  Please do not hesitate to reach out with questions or concerns,  Jaun Bash, PharmD, CPP, BCPS, [facility] Heart Failure Pharmacist  Phone - [phone] 11/22/2023 10:18 AM

## 2023-11-23 ENCOUNTER — Inpatient Hospital Stay

## 2023-11-23 ENCOUNTER — Inpatient Hospital Stay: Admitting: Oncology

## 2023-11-23 DIAGNOSIS — J441 Chronic obstructive pulmonary disease with (acute) exacerbation: Secondary | ICD-10-CM

## 2023-11-23 LAB — RESPIRATORY PANEL BY PCR

## 2023-11-23 LAB — BASIC METABOLIC PANEL WITH GFR
Anion gap: 11 (ref 5–15)
BUN: 22 mg/dL (ref 8–23)
CO2: 25 mmol/L (ref 22–32)
Calcium: 8.3 mg/dL — ABNORMAL LOW (ref 8.9–10.3)
Chloride: 107 mmol/L (ref 98–111)
Creatinine, Ser: 0.99 mg/dL (ref 0.61–1.24)
GFR, Estimated: >60 mL/min (ref >60)
Glucose, Bld: 126 mg/dL — ABNORMAL HIGH (ref 70–99)
Potassium: 3.6 mmol/L (ref 3.5–5.1)
Sodium: 143 mmol/L (ref 135–145)

## 2023-11-23 LAB — CBC
HCT: 32.9 % — ABNORMAL LOW (ref 39.0–52.0)
Hemoglobin: 10.2 g/dL — ABNORMAL LOW (ref 13.0–17.0)
MCH: 28 pg (ref 26.0–34.0)
MCHC: 31 g/dL (ref 30.0–36.0)
MCV: 90.4 fL (ref 80.0–100.0)
Platelets: 252 K/uL (ref 150–400)
RBC: 3.64 MIL/uL — ABNORMAL LOW (ref 4.22–5.81)
RDW: 19.1 % — ABNORMAL HIGH (ref 11.5–15.5)
WBC: 14.2 K/uL — ABNORMAL HIGH (ref 4.0–10.5)
nRBC: 0 % (ref 0.0–0.2)

## 2023-11-23 LAB — GLUCOSE, CAPILLARY
Glucose-Capillary: 108 mg/dL — ABNORMAL HIGH (ref 70–99)
Glucose-Capillary: 154 mg/dL — ABNORMAL HIGH (ref 70–99)
Glucose-Capillary: 206 mg/dL — ABNORMAL HIGH (ref 70–99)
Glucose-Capillary: 124 mg/dL — ABNORMAL HIGH (ref 70–99)

## 2023-11-23 LAB — LIPID PANEL
Cholesterol: 204 mg/dL — ABNORMAL HIGH (ref 0–200)
HDL: 49 mg/dL (ref >40)
LDL Cholesterol: 139 mg/dL — ABNORMAL HIGH (ref 0–99)
Total CHOL/HDL Ratio: 4.2 ratio
Triglycerides: 81 mg/dL (ref <150)
VLDL: 16 mg/dL (ref 0–40)

## 2023-11-23 LAB — PHOSPHORUS: Phosphorus: 2.2 mg/dL — ABNORMAL LOW (ref 2.5–4.6)

## 2023-11-23 LAB — MRSA NEXT GEN BY PCR, NASAL: MRSA by PCR Next Gen: DETECTED — AB

## 2023-11-23 LAB — MAGNESIUM: Magnesium: 2.3 mg/dL (ref 1.7–2.4)

## 2023-11-23 LAB — CK: Total CK: 151 U/L (ref 49–397)

## 2023-11-23 MED ORDER — DOXYCYCLINE HYCLATE 100 MG PO TABS
100.0000 mg | ORAL_TABLET | Freq: Two times a day (BID) | ORAL | Status: DC
  Administered 2023-11-23 – 2023-11-26 (×7): 100 mg via ORAL
  Filled 2023-11-23 (×7): qty 1

## 2023-11-23 MED ORDER — METHYLPREDNISOLONE SODIUM SUCC 40 MG IJ SOLR
40.0000 mg | INTRAMUSCULAR | Status: DC
  Administered 2023-11-23 – 2023-11-25 (×3): 40 mg via INTRAVENOUS
  Filled 2023-11-23 (×3): qty 1

## 2023-11-23 NOTE — Consult Note (Signed)
 PULMONOLOGY         Date: 11/23/2023,   MRN# [id number] Paul Briggs [name] August 02, 1945     AdmissionWeight: 95.3 kg                 CurrentWeight: 87.8 kg  Referring provider: Dr Von    CHIEF COMPLAINT:   Severe COPD exacerbation   HISTORY OF PRESENT ILLNESS   [name] with chronic anemia, CAD, BPH, left adrenal adenoma, combined HFrEF and HFpEF, CLL, HTN, HLD, IIDM, hypothyroidism, CKD stage IIIa, BPH, SDH, OSA not on CPAP, chronic pain and narcotic dependence, COPD who came in to hospital with complaints of acute cough with wheezing.  Reports productive cough worse with exertion.  He finished course of augmentin  with no improvement, but did note worsening with supine position.  On arrival initial blood work showed mild leukocytosis, CXR with interstitial edema.  Patient requested that a pulmonologist to see him and PCCM consult placed for additional evaluation and management due to severe comorbid status.     PAST MEDICAL HISTORY   Past Medical History:  Diagnosis Date  . Adenoma of left adrenal gland   . Anemia   . Aortic atherosclerosis   . Blood transfusion without reported diagnosis   . BPH (benign prostatic hyperplasia)   . Bradycardia   . CAD (coronary artery disease) 08/26/2022   a.) cCTA 08/26/2022: Ca2+ 14.3 (12th %'ile for age/sex.race match control); (<25%) pLAD  . Cellulitis of left thigh 08/2022  . Chronic pain syndrome    a.) on COT managed by pain management  . Chronic, continuous use of opioids    a.) chronic pain syndrome/chronic back pain; managed by pain management  . CKD (chronic kidney disease) stage 3, GFR 30-59 ml/min (HCC)   . 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   a.) Rai stage III  . DDD (degenerative disc disease), cervical   . Difficult airway 10/22/2015   a.) 1st attempt with glidescope  --> macroglossic (unsuccessful); b.) 2nd attempt with Mac 4 and direct laryngoscopy with a 8.0 ETT --> tube too  large to pass through the cords; b.) 3rd attempt (successful) with a Mac 4 and a 7.5 tube  . Diverticulosis   . DM (diabetes mellitus), type 2 (HCC)   . Dysplasia of prostate   . Erectile dysfunction   . Frequent falls   . Heart failure with mildly reduced ejection fraction (HFmrEF) (HCC)    a.) TTE 10/23/2015: EF 60-65%, mod-sev LVH, mild biatrial dil, degen MV disease, AoV sclerosis, asc Ao 38 mm, G1DD; b.) TTE 08/05/2022: EF 45-50%, mod-sev LVH (speckled pattern), sev biatrial dil, mild MR, AoV sclerosis, G1DD  . Hepatic flexure mass 08/23/2022   a.) colonoscopy 08/23/2022: 14 x 15 mm partially obstructing ulcerated distal ascending colon mass; tissue friable --> pathology resulted as tubular adenoma, however felt to be false (-) --> referral to sugery and colectomy recommended; b.) s/p RIGHT hemicolectomy 09/28/2022  . History of MRSA infection 08/28/2022   a.) MRSA PCR (+) 08/28/2022; culture from LEFT thigh abscess  . HTN (hypertension)   . Hyperlipidemia   . Hypogonadism in male   . Hypothyroidism   . IDA (iron  deficiency anemia)   . LBBB (left bundle branch block)   . Long-term use of aspirin  therapy   . Lumbar spinal stenosis   . Mild cardiomegaly   . Multiple lung nodules on CT   . NICM (nonischemic cardiomyopathy) (HCC)    a.)  TTE 10/23/2015: EF 60-65; b.) TTE 08/05/2022: EF 45-50%  . Orchitis and epididymitis 06/16/2013  . OSA on CPAP   . Palindromic rheumatism, hand   . Pericardial effusion   . Prostatitis   . Sepsis (HCC) 08/2022  . Subdural hematoma (HCC) 10/22/2015   a.) s/p traumatic mechanical fall --> CT head 10/22/2015: high-density SDH the left cerebral convexity with13 mm midline shift --> s/p LEFT frontal burr hole craniotomy  . Tubular adenoma of colon   . Ventral hernia      SURGICAL HISTORY   Past Surgical History:  Procedure Laterality Date  . BACK SURGERY    . BIOPSY  08/21/2022   Procedure: BIOPSY;  Surgeon: Onita Elspeth Sharper, DO;  Location:  [facility] ENDOSCOPY;  Service: Gastroenterology;;  . BIOPSY  08/23/2022   Procedure: BIOPSY;  Surgeon: Onita Elspeth Sharper, DO;  Location: [facility] ENDOSCOPY;  Service: Gastroenterology;;  . BIOPSY  02/28/2023   Procedure: BIOPSY;  Surgeon: Onita Elspeth Sharper, DO;  Location: [facility] ENDOSCOPY;  Service: Gastroenterology;;  . SOLMON DAKIN OF CRANIUM Left 10/22/2015  . COLON SURGERY  09/28/2022  . COLONOSCOPY WITH PROPOFOL  N/A 08/23/2022   Procedure: COLONOSCOPY WITH PROPOFOL ;  Surgeon: Onita Elspeth Sharper, DO;  Location: [facility] ENDOSCOPY;  Service: Gastroenterology;  Laterality: N/A;  . COLONOSCOPY WITH PROPOFOL  N/A 02/28/2023   Procedure: COLONOSCOPY WITH PROPOFOL ;  Surgeon: Onita Elspeth Sharper, DO;  Location: [facility] ENDOSCOPY;  Service: Gastroenterology;  Laterality: N/A;  DM  . COLONOSCOPY WITH PROPOFOL  N/A 04/25/2023   Procedure: COLONOSCOPY WITH PROPOFOL ;  Surgeon: Onita Elspeth Sharper, DO;  Location: [facility] ENDOSCOPY;  Service: Gastroenterology;  Laterality: N/A;  DM  . ESOPHAGOGASTRODUODENOSCOPY (EGD) WITH PROPOFOL  N/A 08/21/2022   Procedure: ESOPHAGOGASTRODUODENOSCOPY (EGD) WITH PROPOFOL ;  Surgeon: Onita Elspeth Sharper, DO;  Location: [facility] ENDOSCOPY;  Service: Gastroenterology;  Laterality: N/A;  . ESOPHAGOGASTRODUODENOSCOPY (EGD) WITH PROPOFOL  N/A 02/28/2023   Procedure: ESOPHAGOGASTRODUODENOSCOPY (EGD) WITH PROPOFOL ;  Surgeon: Onita Elspeth Sharper, DO;  Location: [facility] ENDOSCOPY;  Service: Gastroenterology;  Laterality: N/A;  . GIVENS CAPSULE STUDY N/A 08/23/2022   Procedure: GIVENS CAPSULE STUDY;  Surgeon: Onita Elspeth Sharper, DO;  Location: [facility] ENDOSCOPY;  Service: Gastroenterology;  Laterality: N/A;  . HERNIA REPAIR    . INGUINAL HERNIA REPAIR Right 03/09/2023   Procedure: HERNIA REPAIR INGUINAL ADULT, open, RNFA to assist;  Surgeon: Jordis Laneta FALCON, MD;  Location: [facility] ORS;  Service: General;  Laterality: Right;  . IR BONE MARROW BIOPSY & ASPIRATION  08/19/2023  . IR US  GUIDE BX ASP/DRAIN   06/27/2023  . IRRIGATION AND DEBRIDEMENT ABSCESS Left 08/28/2022   Procedure: IRRIGATION AND DEBRIDEMENT ABSCESS LEFT UPPER THIGH/GROIN;  Surgeon: Jordis Laneta FALCON, MD;  Location: [facility] ORS;  Service: General;  Laterality: Left;  . LAPAROSCOPIC RIGHT COLECTOMY Right 09/28/2022   Procedure: LAPAROSCOPIC RIGHT COLECTOMY, RNFA to assist;  Surgeon: Jordis Laneta FALCON, MD;  Location: [facility] ORS;  Service: General;  Laterality: Right;  . POLYPECTOMY  08/23/2022   Procedure: POLYPECTOMY;  Surgeon: Onita Elspeth Sharper, DO;  Location: [facility] ENDOSCOPY;  Service: Gastroenterology;;  . POLYPECTOMY  04/25/2023   Procedure: POLYPECTOMY;  Surgeon: Onita Elspeth Sharper, DO;  Location: [facility] ENDOSCOPY;  Service: Gastroenterology;;  . ROBLEY TATTOO INJECTION  08/23/2022   Procedure: SUBMUCOSAL TATTOO INJECTION;  Surgeon: Onita Elspeth Sharper, DO;  Location: [facility] ENDOSCOPY;  Service: Gastroenterology;;  . TONSILLECTOMY    . VENTRAL HERNIA REPAIR N/A 09/28/2022   Procedure: HERNIA REPAIR VENTRAL ADULT;  Surgeon: Jordis Laneta FALCON, MD;  Location: [facility] ORS;  Service: General;  Laterality: N/A;     FAMILY HISTORY   Family History  Problem Relation Age of Onset  . Cancer Maternal Aunt   . ADD / ADHD Daughter   . Asthma Son   . Prostate cancer Neg Hx   . Kidney disease Neg Hx   . Kidney cancer Neg Hx   . Bladder Cancer Neg Hx      SOCIAL HISTORY   Social History   Tobacco Use  . Smoking status: Former    Types: Cigarettes    Passive exposure: Past  . Smokeless tobacco: Never  . Tobacco comments:    Stop smoking iver 40  plus years ago  Vaping Use  . Vaping status: Never Used  Substance Use Topics  . Alcohol use: No  . Drug use: No     MEDICATIONS    Home Medication:  Current Outpatient Rx  . Order #: [id number] Class: Normal    Current Medication:  Current Facility-Administered Medications:  .  oxyCODONE  (Oxy IR/ROXICODONE ) immediate release tablet 10 mg, 10 mg, Oral, Q8H PRN **AND**  acetaminophen  (TYLENOL ) tablet 325 mg, 325 mg, Oral, Q8H PRN, Hunt, Madison H, RPH .  acetaminophen  (TYLENOL ) tablet 650 mg, 650 mg, Oral, Q4H PRN, Laurita Manor T, MD .  aspirin  EC tablet 81 mg, 81 mg, Oral, Daily, Laurita Manor T, MD, 81 mg at 11/22/23 0852 .  azithromycin (ZITHROMAX) tablet 500 mg, 500 mg, Oral, Daily, Von Bellis, MD, 500 mg at 11/22/23 1401 .  benzonatate (TESSALON) capsule 200 mg, 200 mg, Oral, TID PRN, Zhang, Ping T, MD .  budesonide-glycopyrrolate -formoterol (BREZTRI) 160-9-4.8 MCG/ACT inhaler 2 puff, 2 puff, Inhalation, BID, Laurita Manor DASEN, MD, 2 puff at 11/22/23 2011 .  chlorpheniramine-HYDROcodone  (TUSSIONEX) 10-8 MG/5ML suspension 5 mL, 5 mL, Oral, Q12H PRN, Von Bellis, MD .  empagliflozin  (JARDIANCE ) tablet 10 mg, 10 mg, Oral, QAC breakfast, Von Bellis, MD .  enoxaparin  (LOVENOX ) injection 40 mg, 40 mg, Subcutaneous, QHS, Laurita Manor T, MD, 40 mg at 11/22/23 2105 .  furosemide  (LASIX ) injection 40 mg, 40 mg, Intravenous, BID, Laurita Manor T, MD, 40 mg at 11/22/23 1804 .  guaiFENesin  (MUCINEX ) 12 hr tablet 600 mg, 600 mg, Oral, BID, Von Bellis, MD, 600 mg at 11/22/23 2105 .  hydrALAZINE  (APRESOLINE ) tablet 100 mg, 100 mg, Oral, BID, Zhang, Ping T, MD, 100 mg at 11/22/23 2105 .  insulin  aspart (novoLOG ) injection 0-15 Units, 0-15 Units, Subcutaneous, TID WC, Laurita Manor DASEN, MD, 3 Units at 11/22/23 1804 .  ipratropium-albuterol  (DUONEB) 0.5-2.5 (3) MG/3ML nebulizer solution 3 mL, 3 mL, Nebulization, Q6H PRN, Von Bellis, MD .  levothyroxine  (SYNTHROID ) tablet 200 mcg, 200 mcg, Oral, Q0600, Laurita Manor DASEN, MD, 200 mcg at 11/23/23 0517 .  loratadine (CLARITIN) tablet 10 mg, 10 mg, Oral, Daily, Von Bellis, MD, 10 mg at 11/22/23 1401 .  metoprolol  succinate (TOPROL -XL) 24 hr tablet 25 mg, 25 mg, Oral, Daily, Von Bellis, MD .  mirtazapine (REMERON) tablet 7.5 mg, 7.5 mg, Oral, QHS, Zhang, Ping T, MD, 7.5 mg at 11/22/23 2105 .  ondansetron  (ZOFRAN ) injection 4 mg, 4  mg, Intravenous, Q6H PRN, Laurita Manor T, MD .  predniSONE (DELTASONE) tablet 40 mg, 40 mg, Oral, Q breakfast, Von Bellis, MD .  sacubitril -valsartan  (ENTRESTO ) 24-26 mg per tablet, 1 tablet, Oral, BID, Zhang, Ping T, MD, 1 tablet at 11/22/23 2105 .  sodium chloride  flush (NS) 0.9 % injection 3 mL, 3 mL, Intravenous, Q12H, Laurita Manor T, MD, 3 mL at 11/22/23 2106 .  sodium chloride  flush (NS) 0.9 % injection 3 mL, 3 mL, Intravenous, PRN, Zhang, Ping T, MD .  spironolactone  (ALDACTONE ) tablet 12.5 mg, 12.5 mg, Oral, Daily, Von Bellis, MD, 12.5 mg at 11/22/23 1514    ALLERGIES   Patient has no known allergies.     REVIEW OF SYSTEMS    Review of Systems:  Gen:  Denies  fever, sweats, chills weigh loss  HEENT: Denies blurred vision, double vision, ear pain, eye pain, hearing loss, nose bleeds, sore throat Cardiac:  No dizziness, chest pain or heaviness, chest tightness,edema Resp:   reports dyspnea chronically  Gi: Denies swallowing difficulty, stomach pain, nausea or vomiting, diarrhea, constipation, bowel incontinence Gu:  Denies bladder incontinence, burning urine Ext:   Denies Joint pain, stiffness or swelling Skin: Denies  skin rash, easy bruising or bleeding or hives Endoc:  Denies polyuria, polydipsia , polyphagia or weight change Psych:   Denies depression, insomnia or hallucinations   Other:  All other systems negative   VS: BP (!) 148/92 (BP Location: Left Arm)   Pulse 74   Temp 98.4 F (36.9 C)   Resp 16   Ht 6' 1 (1.854 m)   Wt 87.8 kg   SpO2 97%   BMI 25.54 kg/m      PHYSICAL EXAM    GENERAL:NAD, no fevers, chills, no weakness no fatigue HEAD: Normocephalic, atraumatic.  EYES: Pupils equal, round, reactive to light. Extraocular muscles intact. No scleral icterus.  MOUTH: Moist mucosal membrane. Dentition intact. No abscess noted.  EAR, NOSE, THROAT: Clear without exudates. No external lesions.  NECK: Supple. No thyromegaly. No nodules. No JVD.   PULMONARY: decreased breath sounds with mild rhonchi worse at bases bilaterally.  CARDIOVASCULAR: S1 and S2. Regular rate and rhythm. No murmurs, rubs, or gallops. No edema. Pedal pulses 2+ bilaterally.  GASTROINTESTINAL: Soft, nontender, nondistended. No masses. Positive bowel sounds. No hepatosplenomegaly.  MUSCULOSKELETAL: No swelling, clubbing, or edema. Range of motion full in all extremities.  NEUROLOGIC: Cranial nerves II through XII are intact. No gross focal neurological deficits. Sensation intact. Reflexes intact.  SKIN: No ulceration, lesions, rashes, or cyanosis. Skin warm and dry. Turgor intact.  PSYCHIATRIC: Mood, affect within normal limits. The patient is awake, alert and oriented x 3. Insight, judgment intact.       IMAGING     EXAM: CT CHEST WITHOUT CONTRAST 11/09/2023 11:28:35 AM  TECHNIQUE: CT of the chest was performed without the administration of intravenous contrast. Multiplanar reformatted images are provided for review. Automated exposure control, iterative reconstruction, and/or weight based adjustment of the mA/kV was utilized to reduce the radiation dose to as low as reasonably achievable.  COMPARISON: CT angiogram chest 04/27/2023.  CLINICAL HISTORY: Cough that has been going on for a while but worse over last several days with productive component; SOB last several days; hx of leukemia; no surg former smoker quit about 40 years ago.  FINDINGS:  MEDIASTINUM: Heart is moderately enlarged, unchanged. There is a trace pericardial effusion. There are atherosclerotic calcifications of the aorta. The central airways are clear.  LYMPH NODES: There are numerous prominent and nonenlarged axillary lymph nodes. There are additional enlarged left axillary lymph nodes measuring up to 16 mm which have increased in size and number compared to prior. There is a precarinal enlarged lymph node measuring 15 mm which is new from prior. Enlarged subcarinal  lymph node measures 15 mm, new from prior.  LUNGS AND PLEURA: There are trace bilateral pleural effusions. There are scattered ground  glass opacities and peripheral interstitial opacities throughout both lungs. There are some patchy ground-glass opacities in the bilateral lower lobes, left greater than right and minimal patchy airspace opacities in the right middle lobe. There is central peribronchial wall thickening bilaterally. Nodular density seen in the right lower lobe measuring 7 mm image 3/91. There are few more additional scattered right lower lobe 3 mm nodular densities. These are new from prior. No pneumothorax.  SOFT TISSUES/BONES: There is diffuse chest wall edema. No acute abnormality of the bones.  UPPER ABDOMEN: Right renal cysts are partially visualized. Limited images of the upper abdomen demonstrates no acute abnormality.  IMPRESSION: 1. Scattered ground glass and peripheral interstitial opacities throughout both lungs, with patchy ground-glass opacities in the bilateral lower lobes (left greater than right) and minimal patchy airspace opacities in the right middle lobe. Findings are favored as infectious/inflammatory, although edema may be superimposed. 2. New and increased size and number of enlarged left axillary lymph nodes, measuring up to 16 mm. 3. New precarinal and subcarinal enlarged lymph nodes, each measuring 15 mm. 4. New 7 mm nodular density in the right lower lobe with additional scattered 3 mm nodular densities. Per Fleischner Society Guidelines recommend a non-contrast chest CT at 3-6 months, then consider another non-contrast chest CT at 18-24 months. If patient is low risk for malignancy, non-contrast chest CT at 18-24 months is optional. 5. Trace bilateral pleural effusions. 6. Moderately enlarged heart with trace pericardial effusion, unchanged from prior.  Electronically signed by: Greig Pique MD 11/10/2023 08:13 PM EDT RP Workstation:  [id number]   ASSESSMENT/PLAN   Severe Acute exacerbation of COPD     - reviewed recent CT chest and reviewed CXR from this admission    - will increase steroids from prednisone to IV solumedrol BID 40mg  due to persistent wheezing and rhonchi with severe bronchitic changes on chest imaging.    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   Patient/Family are satisfied with care plan and all questions have been answered.    Provider disclosure: Patient with at least one acute or chronic illness or injury that poses a threat to life or bodily function and is being managed actively during this encounter.  All of the below services have been performed independently by signing provider:  review of prior documentation from internal and or external health records.  Review of previous and current lab results.  Interview and comprehensive assessment during patient visit today. Review of current and previous chest radiographs/CT scans. Discussion of management and test interpretation with health care team and patient/family.   This document was prepared using Dragon voice recognition software and may include unintentional dictation errors.     Kristof Nadeem, M.D.  Division of Pulmonary & Critical Care Medicine

## 2023-11-23 NOTE — Progress Notes (Signed)
 Heart Failure Navigator Progress Note  He is currently an Advanced Heart Failure Team of Dr. Cherrie.  Hospital follow-up scheduled for 11/29/2023 @ 9:00 @ [facility] location.  Navigator will sign off at this time.  Charmaine Pines, RN, BSN [facility] Heart Failure Navigator Secure Chat Only

## 2023-11-23 NOTE — TOC Initial Note (Signed)
 Transition of Care (TOC) - Initial/Assessment Note    Patient Details  Name: Paul Wissmann [name] MRN: [id number] Date of Birth: 1945-05-01  Transition of Care [facility]) CM/SW Contact:    Lauraine JAYSON Carpen, LCSW Phone Number: 11/23/2023, 11:48 AM  Clinical Narrative:  Readmission prevention screen complete. CSW met with patient. No family at bedside. CSW introduced role and explained that discharge planning would be discussed. PCP is Selinda Quan, PA-C. Patient drives himself to appointments. [facility] is [facility] [employer] [facility]. No issues affording medications although he says cough medicine is expensive. Patient lives home with his wife. No home health or DME use prior to admission. No further concerns. CSW will continue to follow patient for support and facilitate return home once stable. He stated his car is in the parking lot and he will drive himself home.                Expected Discharge Plan: Home/Self Care Barriers to Discharge: Continued Medical Work up   Patient Goals and CMS Choice            Expected Discharge Plan and Services     Post Acute Care Choice: NA Living arrangements for the past 2 months: Single Family Home                                      Prior Living Arrangements/Services Living arrangements for the past 2 months: Single Family Home Lives with:: Spouse Patient language and need for interpreter reviewed:: Yes Do you feel safe going back to the place where you live?: Yes      Need for Family Participation in Patient Care: Yes (Comment) Care giver support system in place?: Yes (comment)   Criminal Activity/Legal Involvement Pertinent to Current Situation/Hospitalization: No - Comment as needed  Activities of Daily Living   ADL Screening (condition at time of admission) Independently performs ADLs?: Yes (appropriate for developmental age) Is the patient deaf or have difficulty hearing?: No Does the patient have difficulty  seeing, even when wearing glasses/contacts?: No Does the patient have difficulty concentrating, remembering, or making decisions?: No  Permission Sought/Granted                  Emotional Assessment Appearance:: Appears stated age Attitude/Demeanor/Rapport: Engaged, Gracious Affect (typically observed): Accepting, Appropriate, Calm, Pleasant Orientation: : Oriented to Self, Oriented to Place, Oriented to  Time, Oriented to Situation Alcohol / Substance Use: Not Applicable Psych Involvement: No (comment)  Admission diagnosis:  CHF (congestive heart failure) (HCC) [I50.9] Dyspnea, unspecified type [R06.00] Acute on chronic congestive heart failure, unspecified heart failure type [facility]) [I50.9] Patient Active Problem List   Diagnosis Date Noted   Acute on chronic combined systolic and diastolic CHF (congestive heart failure) (HCC) 11/21/2023   CHF (congestive heart failure) (HCC) 11/21/2023   Unintentional weight loss 11/01/2023   Cellulitis 10/05/2023   Boil of trunk 10/05/2023   Septic prepatellar bursitis of right knee 06/23/2023   Chronic systolic CHF (congestive heart failure) (HCC) 06/23/2023   COPD (chronic obstructive pulmonary disease) (HCC) 06/23/2023   Chronic kidney disease, stage 3a (HCC) 06/23/2023   Leg edema, right 06/23/2023   Type II diabetes mellitus with renal manifestations (HCC) 06/23/2023   Neutropenia 05/25/2023   Unilateral recurrent inguinal hernia without obstruction or gangrene 03/09/2023   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   Postoperative surgical complication involving skin associated with non-dermatologic procedure  10/26/2022   Mass of colon 09/28/2022   Tubular adenoma of colon 09/03/2022   Fall 08/27/2022   Abscess of left groin 08/27/2022   Elevated troponin 08/26/2022   Contusion of groin, left, initial encounter 08/26/2022   Chronic, continuous use of opioids 08/26/2022   Cardiomyopathy (HCC) 08/26/2022    Acute on chronic anemia 08/20/2022   Cough 08/06/2022   Transaminitis 05/21/2022   Chronic right shoulder pain 02/25/2022   Cervical radicular pain 02/25/2022   Encounter for antineoplastic chemotherapy 02/06/2022   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/01/2022   IDA (iron  deficiency anemia) 01/01/2022   Right ankle pain 12/25/2021   Pneumonia 12/25/2021   Type II diabetes mellitus (HCC) 12/25/2021   Acute renal failure superimposed on stage 3a chronic kidney disease (HCC) 12/25/2021   HLD (hyperlipidemia) 12/25/2021   HTN (hypertension) 12/25/2021   Obesity with body mass index (BMI) of 30.0 to 39.9 12/25/2021   Right knee pain 12/25/2021   Cellulitis of right lower extremity 12/25/2021   Septic infrapatellar bursitis of right knee 12/25/2021   Class 1 obesity without serious comorbidity with body mass index (BMI) of 30.0 to 30.9 in adult 10/02/2021   Encounter for long-term opiate analgesic use 06/04/2021   Chronic radicular lumbar pain 04/07/2021   Lumbar facet arthropathy 04/07/2021   Lumbar degenerative disc disease 04/07/2021   Spinal stenosis, lumbar region, with neurogenic claudication 11/06/2020   Chronic left shoulder pain 11/06/2020   Chronic pain syndrome 11/06/2020   Pain management contract signed 11/06/2020   CPAP use counseling 08/25/2020   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  ([address]), [place] - [address] [place]) [place] [address] Phone: [phone] Fax: [phone]  [facility] [address] [place], [place] - [address] [place] [address] Phone: [phone] Fax: [phone]  [facility] - [place], [place] - [address] [place] [address] Phone: [phone] Fax: [phone]  [facility] - [facility] [address] [place] [address] Phone: [phone] Fax: [phone]  [facility] [address] [place] [address] Phone: [phone] Fax: [phone]  [facility] [address] [place], [place] - [address] [place] [address] Phone: [phone] Fax: [phone]     Social Drivers of Health (SDOH) Social History: SDOH Screenings   Food Insecurity: No Food Insecurity (11/21/2023)  Housing: Low Risk  (11/21/2023)  Transportation Needs: No Transportation Needs (11/21/2023)  Utilities: Not At Risk (11/21/2023)  Alcohol Screen: Low Risk  (01/22/2022)  Depression (PHQ2-9): Low Risk  (11/01/2023)  Financial Resource Strain: Patient Declined (04/26/2023)   Received from [facility]  Recent Concern: Financial Resource Strain - Medium Risk (02/24/2023)   Received from [facility]  Physical Activity: Unknown (07/26/2022)  Social Connections: Moderately Integrated (11/21/2023)  Stress: Stress Concern  Present (07/26/2022)  Tobacco Use: Medium Risk (11/21/2023)   SDOH Interventions:     Readmission Risk Interventions    11/23/2023   11:46 AM 10/01/2022    9:51 AM 08/29/2022   10:57 AM  Readmission Risk Prevention Plan  Transportation Screening Complete Complete Complete  PCP or Specialist Appt within 3-5 Days  Complete   HRI or Home Care Consult  Patient refused   Social Work Consult for  Recovery Care Planning/Counseling  Complete   Palliative Care Screening  Not Applicable   Medication Review [facility]) Complete Complete Complete  PCP or Specialist appointment within 3-5 days of discharge Complete  Complete  HRI or Home Care Consult   Complete  SW Recovery Care/Counseling Consult Complete  Complete  Palliative Care Screening Not Applicable  Not Applicable  Skilled Nursing Facility Not Applicable  Complete

## 2023-11-23 NOTE — Progress Notes (Addendum)
 Heart Failure Stewardship Pharmacy Note  PCP: Cyrus Selinda Moose, PA-C PCP-Cardiologist: Redell Cave, MD  HPI: Paul Eltringham [name] is a 78 y.o. 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 On admission, BNP was 1856.6, HS-troponin was 42, and A1c 5.5. Chest x-ray noted mild interstitial edema and bilateral pleural effusions concerning for mild CHF.   Pertinent cardiac history: TTE 07/2022 with LVEF 45-50%. Cardiac CTA 08/2022 showed coronary calcium  score of 14.3, 12th percentile for age and sex matched control, minimal proximal LAD stenosis, overall minimal nonobstructive CAD. cMRI 03/2023 showed EF 43% Mod-sev basal septal hypertrophy (16mm) RVEF 48% No LGE Mild MR, suggestive of non ischemic cardiomyopathy. Possible inflammatory disease involving the basal lateral wall. TTE pending this admission noted LVEF down to 35-40%, moderate LVH, G2DD, mildly reduced RV function, mild AR.   Pertinent Lab Values: Creatinine  Date Value Ref Range Status  10/03/2023 1.21 0.61 - 1.24 mg/dL Final   Creat  Date Value Ref Range Status  07/06/2021 1.08 0.70 - 1.28 mg/dL Final   Creatinine, Ser  Date Value Ref Range Status  11/23/2023 0.99 0.61 - 1.24 mg/dL Final   BUN  Date Value Ref Range Status  11/23/2023 22 8 - 23 mg/dL Final  88/72/7975 12 8 - 27 mg/dL Final  90/87/7984 27 (H) 7 - 18 mg/dL Final   Potassium  Date Value Ref Range Status  11/23/2023 3.6 3.5 - 5.1 mmol/L Final  10/06/2013 4.1 3.5 - 5.1 mmol/L Final   Sodium  Date Value Ref Range Status  11/23/2023 143 135 - 145 mmol/L Final  12/22/2022 146 (H) 134 - 144 mmol/L Final  10/06/2013 139 136 - 145 mmol/L Final   B Natriuretic Peptide  Date Value Ref Range Status  11/22/2023 1,857.1 (H) 0.0 - 100.0 pg/mL Final    Comment:     Performed at [facility], [address] [place], [place] [address]   Magnesium  Date Value Ref Range Status  11/23/2023 2.3 1.7 - 2.4 mg/dL Final    Comment:    Performed at [facility], [address] [place], [place] [address]   HbA1c POC (<> result, manual entry)  Date Value Ref Range Status  07/06/2021 6.5 4.0 - 5.6 % Final   Hgb A1c MFr Bld  Date Value Ref Range Status  11/21/2023 5.5 4.8 - 5.6 % Final    Comment:    (NOTE) Diagnosis of Diabetes The following HbA1c ranges recommended by the American Diabetes Association (ADA) may be used as an aid in the diagnosis of diabetes mellitus.  Hemoglobin             Suggested A1C NGSP%              Diagnosis  <5.7                   Non Diabetic  5.7-6.4                Pre-Diabetic  >6.4                   Diabetic  <7.0                   Glycemic control for  adults with diabetes.     TSH  Date Value Ref Range Status  06/29/2023 28.297 (H) 0.350 - 4.500 uIU/mL Final    Comment:    Performed by a 3rd Generation assay with a functional sensitivity of <=0.01 uIU/mL. Performed at [facility], [address] [place], [place] [address]   12/22/2022 27.300 (H) 0.450 - 4.500 uIU/mL Final   LDH  Date Value Ref Range Status  11/01/2023 285 (H) 98 - 192 U/L Final    Comment:    Performed at [facility], [address] [place], [place] [address]    Vital Signs:  Temp:  [98.1 F (36.7 C)-98.4 F (36.9 C)] 98.4 F (36.9 C) (10/29 0404) Pulse Rate:  [65-78] 74 (10/29 0404) Cardiac Rhythm: Normal sinus rhythm (10/28 2015) Resp:  [14-17] 16 (10/29 0404) BP: (137-158)/(78-92) 148/92 (10/29 0404) SpO2:  [94 %-100 %] 97 % (10/29 0404) Weight:  [87.8 kg (193 lb 9 oz)-89.5 kg (197 lb 5 oz)] 87.8 kg (193 lb 9 oz) (10/29 0455)  Intake/Output Summary (Last 24 hours) at 11/23/2023 0726 Last data filed at 11/22/2023 2300 Gross per 24 hour  Intake 240 ml  Output  2475 ml  Net -2235 ml    Current Heart Failure Medications:  Loop diuretic: furosemide  40 mg IV BID Beta-Blocker: none ACEI/ARB/ARNI: Entresto  24-26 mg BID MRA: spironolactone  12.5 mg daily SGLT2i: Jardiance  10 mg daily Other: hydralazine  100 mg BID  Prior to admission Heart Failure Medications:  Loop diuretic: furosemide  20 mg prn Beta-Blocker: metoprolol  succinate 25 mg daily (not taking) ACEI/ARB/ARNI: Entresto  97-103 mg BID (not taking) MRA: spironolactone  25 mg daily (not taking) SGLT2i: Jardiance  10 mg daily Other: hydralazine  100 mg BID  Assessment: 1. Acute on chronic systolic heart failure (LVEF 35-40%) with G2DD and mild RV dysfunction, due to NICM. NYHA class III symptoms.  -Symptoms: Reports feeling improved since admission. Still with productive cough. Orthopnea is resolved. Appetite is fine.  -Volume: Appears to still be euvolemic to mildly hypervolemic on exam. Trace LEE. JVP up. Creatinine and BUN are stable. Would continue IV furosemide  for now.  -Hemodynamics: BP is elevated. -BB: Currently being held due to hypervolemia. Consider resuming tomorrow if nearly euvolemic. -ACEI/ARB/ARNI: Entresto  resumed at lower dose of 24-26 mg BID. Can consider increasing today given BP remains elevated and tolerated 97-103 mg BID at home. -MRA: Consider increasing spironolactone  25 mg daily.  K <4. -SGLT2i: Continue Jardiance  10 mg daily  Plan: 1) Medication changes recommended at this time: -Consider increasing spironolactone  to 25 mg daily -Consider increasing Entresto  to 49-51 mg BID.   2) Patient assistance: -Pending  3) Education: - Patient has been educated on current HF medications and potential additions to HF medication regimen - Patient verbalizes understanding that over the next few months, these medication doses may change and more medications may be added to optimize HF regimen - Patient has been educated on basic disease state pathophysiology and goals of  therapy  Please do not hesitate to reach out with questions or concerns,  Jaun Bash, PharmD, CPP, BCPS, [facility] Heart Failure Pharmacist  Phone - [phone] 11/23/2023 7:26 AM

## 2023-11-23 NOTE — Progress Notes (Addendum)
 PROGRESS NOTE    Paul Kessen [name]  FMW:[id number] DOB: 1945/02/26 DOA: 11/21/2023 PCP: Cyrus Selinda Moose, PA-C   Assessment & Plan:   Principal Problem:   CHF (congestive heart failure) (HCC) Active Problems:   Acute on chronic combined systolic and diastolic CHF (congestive heart failure) (HCC)  Assessment and Plan: Acute on chronic combined CHF exacerbation: continue on IV lasix . Monitor I/Os. Continue on metoprolol , aldactone , entresto , hydralazine .    COPD exacerbation: continue on azithromycin, steroids, bronchodilators. Started on doxy as per pulmon. Encourage incentive spirometry. Pulmon following and recs apprec    HTN: continue on metoprolol , aldactone , entresto , hydralazine    H/o CLL: management per onco outpatient    Hypothyroidism: continue on levothyroxine    DM2: well controlled, HbA1c 5.5. Continue on SSI w/ accuchecks. Holding home metformin   Hx of OSA: does not use CPAP at home. CPAP at bedtime prn       DVT prophylaxis: lovenox  Code Status: full  Family Communication: discussed pt's care w/ daughter, [name], and answered her questions  Disposition Plan: likely d/c back home   Level of care: Telemetry Cardiac  Status is: Inpatient Remains inpatient appropriate because: severity of illness    Consultants:  pulmon  Procedures:   Antimicrobials: azithromycin, doxy   Subjective: Pt c/o shortness of breath   Objective: Vitals:   11/22/23 2354 11/23/23 0404 11/23/23 0455 11/23/23 0837  BP: (!) 150/78 (!) 148/92  (!) 167/91  Pulse: 69 74  73  Resp: 16 16  14   Temp: 98.1 F (36.7 C) 98.4 F (36.9 C)  98 F (36.7 C)  TempSrc:      SpO2: 95% 97%  99%  Weight:   87.8 kg   Height:        Intake/Output Summary (Last 24 hours) at 11/23/2023 1019 Last data filed at 11/22/2023 2300 Gross per 24 hour  Intake 240 ml  Output 1275 ml  Net -1035 ml   Filed Weights   11/22/23 0451 11/22/23 1037 11/23/23 0455  Weight: 90.1 kg  89.5 kg 87.8 kg    Examination:  General exam: Appears calm and comfortable  Respiratory system: diminished breath sounds b/l Cardiovascular system: S1 & S2 +. No rubs, gallops or clicks. No pedal edema. Gastrointestinal system: Abdomen is nondistended, soft and nontender.Normal bowel sounds heard. Central nervous system: Alert and oriented. Moves all extremities  Psychiatry: Judgement and insight appear normal.Flat mood and affect   Data Reviewed: I have personally reviewed following labs and imaging studies  CBC: Recent Labs  Lab 11/21/23 1235 11/23/23 0517  WBC 10.6* 14.2*  HGB 9.9* 10.2*  HCT 32.5* 32.9*  MCV 91.8 90.4  PLT 246 252   Basic Metabolic Panel: Recent Labs  Lab 11/21/23 1235 11/22/23 0535 11/22/23 0947 11/23/23 0517  NA 138 141  --  143  K 4.0 3.8  --  3.6  CL 107 109  --  107  CO2 22 25  --  25  GLUCOSE 103* 116*  --  126*  BUN 18 18  --  22  CREATININE 1.00 0.94  --  0.99  CALCIUM  8.7* 8.1*  --  8.3*  MG  --   --  2.2 2.3  PHOS  --   --  2.5 2.2*   GFR: Estimated Creatinine Clearance: 70.6 mL/min (by C-G formula based on SCr of 0.99 mg/dL). Liver Function Tests: No results for input(s): AST, ALT, ALKPHOS, BILITOT, PROT, ALBUMIN in the last 168 hours. No results for input(s): LIPASE, AMYLASE in  the last 168 hours. No results for input(s): AMMONIA in the last 168 hours. Coagulation Profile: No results for input(s): INR, PROTIME in the last 168 hours. Cardiac Enzymes: Recent Labs  Lab 11/23/23 0517  CKTOTAL 151   BNP (last 3 results) No results for input(s): PROBNP in the last 8760 hours. HbA1C: Recent Labs    11/21/23 1725  HGBA1C 5.5   CBG: Recent Labs  Lab 11/22/23 0809 11/22/23 1207 11/22/23 1659 11/22/23 1952 11/23/23 0821  GLUCAP 115* 166* 158* 152* 108*   Lipid Profile: Recent Labs    11/23/23 0517  CHOL 204*  HDL 49  LDLCALC 139*  TRIG 81  CHOLHDL 4.2   Thyroid  Function Tests: No  results for input(s): TSH, T4TOTAL, FREET4, T3FREE, THYROIDAB in the last 72 hours. Anemia Panel: No results for input(s): VITAMINB12, FOLATE, FERRITIN, TIBC, IRON , RETICCTPCT in the last 72 hours. Sepsis Labs: Recent Labs  Lab 11/22/23 0947  PROCALCITON <0.10    Recent Results (from the past 240 hours)  Resp panel by RT-PCR (RSV, Flu A&B, Covid) Anterior Nasal Swab     Status: None   Collection Time: 11/21/23 12:35 PM   Specimen: Anterior Nasal Swab  Result Value Ref Range Status   SARS Coronavirus 2 by RT PCR NEGATIVE NEGATIVE Final    Comment: (NOTE) SARS-CoV-2 target nucleic acids are NOT DETECTED.  The SARS-CoV-2 RNA is generally detectable in upper respiratory specimens during the acute phase of infection. The lowest concentration of SARS-CoV-2 viral copies this assay can detect is 138 copies/mL. A negative result does not preclude SARS-Cov-2 infection and should not be used as the sole basis for treatment or other patient management decisions. A negative result may occur with  improper specimen collection/handling, submission of specimen other than nasopharyngeal swab, presence of viral mutation(s) within the areas targeted by this assay, and inadequate number of viral copies(<138 copies/mL). A negative result must be combined with clinical observations, patient history, and epidemiological information. The expected result is Negative.  Fact Sheet for Patients:  bloggercourse.com  Fact Sheet for Healthcare Providers:  seriousbroker.it  This test is no t yet approved or cleared by the [place]  FDA and  has been authorized for detection and/or diagnosis of SARS-CoV-2 by FDA under an Emergency Use Authorization (EUA). This EUA will remain  in effect (meaning this test can be used) for the duration of the COVID-19 declaration under Section 564(b)(1) of the Act, 21 U.S.C.section 360bbb-3(b)(1),  unless the authorization is terminated  or revoked sooner.       Influenza A by PCR NEGATIVE NEGATIVE Final   Influenza B by PCR NEGATIVE NEGATIVE Final    Comment: (NOTE) The Xpert Xpress SARS-CoV-2/FLU/RSV plus assay is intended as an aid in the diagnosis of influenza from Nasopharyngeal swab specimens and should not be used as a sole basis for treatment. Nasal washings and aspirates are unacceptable for Xpert Xpress SARS-CoV-2/FLU/RSV testing.  Fact Sheet for Patients: bloggercourse.com  Fact Sheet for Healthcare Providers: seriousbroker.it  This test is not yet approved or cleared by the [place]  FDA and has been authorized for detection and/or diagnosis of SARS-CoV-2 by FDA under an Emergency Use Authorization (EUA). This EUA will remain in effect (meaning this test can be used) for the duration of the COVID-19 declaration under Section 564(b)(1) of the Act, 21 U.S.C. section 360bbb-3(b)(1), unless the authorization is terminated or revoked.     Resp Syncytial Virus by PCR NEGATIVE NEGATIVE Final    Comment: (NOTE) Fact Sheet for  Patients: bloggercourse.com  Fact Sheet for Healthcare Providers: seriousbroker.it  This test is not yet approved or cleared by the [place]  FDA and has been authorized for detection and/or diagnosis of SARS-CoV-2 by FDA under an Emergency Use Authorization (EUA). This EUA will remain in effect (meaning this test can be used) for the duration of the COVID-19 declaration under Section 564(b)(1) of the Act, 21 U.S.C. section 360bbb-3(b)(1), unless the authorization is terminated or revoked.  Performed at [facility], [address], [place], [place] [address]          Radiology Studies: ECHOCARDIOGRAM COMPLETE Result Date: 11/22/2023    ECHOCARDIOGRAM REPORT   Patient Name:   Paul Kapusta [name] Date of Exam:  11/22/2023 Medical Rec #:  [id number]                 Height:       73.0 in Accession #:    [id number]                Weight:       198.6 lb Date of Birth:  Mar 24, 1945                  BSA:          2.145 m Patient Age:    78 years                  BP:           148/72 mmHg Patient Gender: M                         HR:           66 bpm. Exam Location:  [facility] Procedure: 2D Echo, Cardiac Doppler and Color Doppler (Both Spectral and Color            Flow Doppler were utilized during procedure). Indications:     CHF-Acute Systolic I50.21  History:         Patient has prior history of Echocardiogram examinations, most                  recent 08/05/2022. CHF.  Sonographer:     Rosina Dunk Referring Phys:  [id number] CORT ONEIDA MANA Diagnosing Phys: Lonni Hanson MD IMPRESSIONS  1. Left ventricular ejection fraction, by estimation, is 35 to 40%. The left ventricle has moderately decreased function. The left ventricle demonstrates global hypokinesis with akinesis of the inferior wall. The left ventricular internal cavity size was moderately dilated. There is moderate left ventricular hypertrophy. Left ventricular diastolic parameters are consistent with Grade II diastolic dysfunction (pseudonormalization). Elevated left atrial pressure.  2. Right ventricular systolic function is mildly reduced. The right ventricular size is mildly enlarged.  3. Left atrial size was severely dilated.  4. A small pericardial effusion is present. The pericardial effusion is circumferential.  5. The mitral valve is degenerative. Mild to moderate mitral valve regurgitation. No evidence of mitral stenosis.  6. The aortic valve is tricuspid. There is mild thickening of the aortic valve. Aortic valve regurgitation is mild.  7. Aortic dilatation noted. There is mild dilatation of the aortic root, measuring 44 mm.  8. Right atrial size was mildly dilated. FINDINGS  Left Ventricle: Left ventricular ejection fraction, by estimation, is 35 to 40%. The  left ventricle has moderately decreased function. The left ventricle demonstrates global hypokinesis with akinesis of the inferior wall. The left ventricular internal cavity size was moderately  dilated. There is moderate left ventricular hypertrophy. Left ventricular diastolic parameters are consistent with Grade II diastolic dysfunction (pseudonormalization). Elevated left atrial pressure. Right Ventricle: The right ventricular size is mildly enlarged. No increase in right ventricular wall thickness. Right ventricular systolic function is mildly reduced. Left Atrium: Left atrial size was severely dilated. Right Atrium: Right atrial size was mildly dilated. Pericardium: A small pericardial effusion is present. The pericardial effusion is circumferential. Mitral Valve: The mitral valve is degenerative in appearance. There is mild thickening of the mitral valve leaflet(s). Mild to moderate mitral valve regurgitation. No evidence of mitral valve stenosis. MV peak gradient, 5.2 mmHg. The mean mitral valve gradient is 3.0 mmHg. Tricuspid Valve: The tricuspid valve is normal in structure. Tricuspid valve regurgitation is mild. Aortic Valve: The aortic valve is tricuspid. There is mild thickening of the aortic valve. Aortic valve regurgitation is mild. Aortic regurgitation PHT measures 428 msec. Aortic valve mean gradient measures 4.0 mmHg. Aortic valve peak gradient measures 7.2 mmHg. Aortic valve area, by VTI measures 3.92 cm. Pulmonic Valve: The pulmonic valve was normal in structure. Pulmonic valve regurgitation is not visualized. No evidence of pulmonic stenosis. Aorta: Aortic dilatation noted. There is mild dilatation of the aortic root, measuring 44 mm. IAS/Shunts: No atrial level shunt detected by color flow Doppler. Additional Comments: There is a small pleural effusion in the left lateral region.  LEFT VENTRICLE PLAX 2D LVIDd:         6.25 cm      Diastology LVIDs:         5.05 cm      LV e' medial:    4.79 cm/s  LV PW:         1.75 cm      LV E/e' medial:  21.9 LV IVS:        1.40 cm      LV e' lateral:   5.87 cm/s LVOT diam:     2.70 cm      LV E/e' lateral: 17.9 LV SV:         109 LV SV Index:   51 LVOT Area:     5.73 cm  LV Volumes (MOD) LV vol d, MOD A2C: 235.0 ml LV vol d, MOD A4C: 210.0 ml LV vol s, MOD A2C: 109.0 ml LV vol s, MOD A4C: 153.0 ml LV SV MOD A2C:     126.0 ml LV SV MOD A4C:     210.0 ml LV SV MOD BP:      109.4 ml RIGHT VENTRICLE             IVC RV Basal diam:  5.80 cm     IVC diam: 2.60 cm RV Mid diam:    4.60 cm RV S prime:     10.60 cm/s TAPSE (M-mode): 1.5 cm LEFT ATRIUM              Index        RIGHT ATRIUM           Index LA diam:        4.90 cm  2.28 cm/m   RA Area:     23.80 cm LA Vol (A2C):   191.0 ml 89.03 ml/m  RA Volume:   74.50 ml  34.73 ml/m LA Vol (A4C):   145.0 ml 67.59 ml/m LA Biplane Vol: 174.0 ml 81.10 ml/m  AORTIC VALVE  PULMONIC VALVE AV Area (Vmax):    3.94 cm     PV Vmax:        0.95 m/s AV Area (Vmean):   3.64 cm     PV Vmean:       68.600 cm/s AV Area (VTI):     3.92 cm     PV VTI:         0.230 m AV Vmax:           134.00 cm/s  PV Peak grad:   3.6 mmHg AV Vmean:          89.600 cm/s  PV Mean grad:   2.0 mmHg AV VTI:            0.279 m      RVOT Peak grad: 2 mmHg AV Peak Grad:      7.2 mmHg AV Mean Grad:      4.0 mmHg LVOT Vmax:         92.10 cm/s LVOT Vmean:        57.000 cm/s LVOT VTI:          0.191 m LVOT/AV VTI ratio: 0.68 AI PHT:            428 msec  AORTA Ao Root diam: 4.40 cm Ao Asc diam:  3.50 cm MITRAL VALVE MV Area (PHT): 4.39 cm     SHUNTS MV Area VTI:   3.12 cm     Systemic VTI:  0.19 m MV Peak grad:  5.2 mmHg     Systemic Diam: 2.70 cm MV Mean grad:  3.0 mmHg     Pulmonic VTI:  0.164 m MV Vmax:       1.14 m/s MV Vmean:      79.4 cm/s MV Decel Time: 173 msec MV E velocity: 105.00 cm/s MV A velocity: 95.10 cm/s MV E/A ratio:  1.10 Lonni Hanson MD Electronically signed by Lonni Hanson MD Signature Date/Time: 11/22/2023/3:06:33 PM     Final    DG Chest Portable 1 View Result Date: 11/21/2023 EXAM: 1 VIEW(S) XRAY OF THE CHEST 11/21/2023 01:01:44 PM COMPARISON: None available. CLINICAL HISTORY: sob. Shortness of breath FINDINGS: LUNGS AND PLEURA: Retrocardiac opacification of the left lower lobe. Mild pulmonary edema. Trace bilateral pleural effusions. No pneumothorax. HEART AND MEDIASTINUM: Cardiomegaly. Atherosclerotic plaque. BONES AND SOFT TISSUES: No acute osseous abnormality. IMPRESSION: 1. Mild interstitial edema and bilateral pleural effusions concerning for mild chf. 2. Decreased aeration to the left base which may reflect retrocardiac atelectasis or airspace consolidation. Correlate for any signs or symptoms of pneumonia. Electronically signed by: Waddell Calk MD 11/21/2023 02:45 PM EDT RP Workstation: [id number]        Scheduled Meds:  aspirin  EC  81 mg Oral Daily   azithromycin  500 mg Oral Daily   budesonide-glycopyrrolate -formoterol  2 puff Inhalation BID   doxycycline   100 mg Oral Q12H   empagliflozin   10 mg Oral QAC breakfast   enoxaparin  (LOVENOX ) injection  40 mg Subcutaneous QHS   furosemide   40 mg Intravenous BID   guaiFENesin   600 mg Oral BID   hydrALAZINE   100 mg Oral BID   insulin  aspart  0-15 Units Subcutaneous TID WC   levothyroxine   200 mcg Oral Q0600   loratadine  10 mg Oral Daily   methylPREDNISolone  (SOLU-MEDROL ) injection  40 mg Intravenous Q24H   metoprolol  succinate  25 mg Oral Daily   mirtazapine  7.5 mg Oral QHS   sacubitril -valsartan   1 tablet Oral BID  sodium chloride  flush  3 mL Intravenous Q12H   spironolactone   12.5 mg Oral Daily   Continuous Infusions:   LOS: 1 day       Anthony CHRISTELLA Pouch, MD [facility] Pager [phone]  If 7PM-7AM, please contact night-coverage [email or web address] 11/23/2023, 10:19 AM

## 2023-11-23 NOTE — Plan of Care (Signed)

## 2023-11-24 ENCOUNTER — Other Ambulatory Visit (HOSPITAL_COMMUNITY): Payer: Self-pay

## 2023-11-24 DIAGNOSIS — J441 Chronic obstructive pulmonary disease with (acute) exacerbation: Secondary | ICD-10-CM | POA: Diagnosis not present

## 2023-11-24 DIAGNOSIS — I5043 Acute on chronic combined systolic (congestive) and diastolic (congestive) heart failure: Secondary | ICD-10-CM | POA: Diagnosis not present

## 2023-11-24 LAB — BASIC METABOLIC PANEL WITH GFR
Anion gap: 9 (ref 5–15)
BUN: 25 mg/dL — ABNORMAL HIGH (ref 8–23)
CO2: 25 mmol/L (ref 22–32)
Calcium: 8.3 mg/dL — ABNORMAL LOW (ref 8.9–10.3)
Chloride: 108 mmol/L (ref 98–111)
Creatinine, Ser: 1.11 mg/dL (ref 0.61–1.24)
GFR, Estimated: >60 mL/min (ref >60)
Glucose, Bld: 115 mg/dL — ABNORMAL HIGH (ref 70–99)
Potassium: 3.7 mmol/L (ref 3.5–5.1)
Sodium: 142 mmol/L (ref 135–145)

## 2023-11-24 LAB — GLUCOSE, CAPILLARY
Glucose-Capillary: 108 mg/dL — ABNORMAL HIGH (ref 70–99)
Glucose-Capillary: 183 mg/dL — ABNORMAL HIGH (ref 70–99)
Glucose-Capillary: 313 mg/dL — ABNORMAL HIGH (ref 70–99)
Glucose-Capillary: 70 mg/dL (ref 70–99)
Glucose-Capillary: 105 mg/dL — ABNORMAL HIGH (ref 70–99)

## 2023-11-24 LAB — CBC
HCT: 33.9 % — ABNORMAL LOW (ref 39.0–52.0)
Hemoglobin: 10.5 g/dL — ABNORMAL LOW (ref 13.0–17.0)
MCH: 28.5 pg (ref 26.0–34.0)
MCHC: 31 g/dL (ref 30.0–36.0)
MCV: 91.9 fL (ref 80.0–100.0)
Platelets: 243 K/uL (ref 150–400)
RBC: 3.69 MIL/uL — ABNORMAL LOW (ref 4.22–5.81)
RDW: 18.9 % — ABNORMAL HIGH (ref 11.5–15.5)
WBC: 14.7 K/uL — ABNORMAL HIGH (ref 4.0–10.5)
nRBC: 0 % (ref 0.0–0.2)

## 2023-11-24 LAB — MAGNESIUM: Magnesium: 2.3 mg/dL (ref 1.7–2.4)

## 2023-11-24 LAB — C-REACTIVE PROTEIN: CRP: 0.6 mg/dL (ref <1.0)

## 2023-11-24 NOTE — Progress Notes (Signed)
 PULMONOLOGY         Date: 11/24/2023,   MRN# [id number] Paul Pardini [name] 19-Jun-1945     AdmissionWeight: 95.3 kg                 CurrentWeight: 84.5 kg  Referring provider: Dr Von    CHIEF COMPLAINT:   Severe COPD exacerbation   HISTORY OF PRESENT ILLNESS   [name] with chronic anemia, CAD, BPH, left adrenal adenoma, combined HFrEF and HFpEF, CLL, HTN, HLD, IIDM, hypothyroidism, CKD stage IIIa, BPH, SDH, OSA not on CPAP, chronic pain and narcotic dependence, COPD who came in to hospital with complaints of acute cough with wheezing.  Reports productive cough worse with exertion.  He finished course of augmentin  with no improvement, but did note worsening with supine position.  On arrival initial blood work showed mild leukocytosis, CXR with interstitial edema.  Patient requested that a pulmonologist to see him and PCCM consult placed for additional evaluation and management due to severe comorbid status.    11/24/23- patient is laying in bed with rhonchorous breathing.  Reports no distress but admits to orthopnea. WBC count trending up.  He is on solumedrol steroids.  He is on doxycycline  and flagyl .  PAST MEDICAL HISTORY   Past Medical History:  Diagnosis Date   Adenoma of left adrenal gland    Anemia    Aortic atherosclerosis    Blood transfusion without reported diagnosis    BPH (benign prostatic hyperplasia)    Bradycardia    CAD (coronary artery disease) 08/26/2022   a.) cCTA 08/26/2022: Ca2+ 14.3 (12th %'ile for age/sex.race match control); (<25%) pLAD   Cellulitis of left thigh 08/2022   Chronic pain syndrome    a.) on COT managed by pain management   Chronic, continuous use of opioids    a.) chronic pain syndrome/chronic back pain; managed by pain management   CKD (chronic kidney disease) stage 3, GFR 30-59 ml/min (HCC)    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   a.) Rai stage III   DDD (degenerative disc disease),  cervical    Difficult airway 10/22/2015   a.) 1st attempt with glidescope  --> macroglossic (unsuccessful); b.) 2nd attempt with Mac 4 and direct laryngoscopy with a 8.0 ETT --> tube too large to pass through the cords; b.) 3rd attempt (successful) with a Mac 4 and a 7.5 tube   Diverticulosis    DM (diabetes mellitus), type 2 (HCC)    Dysplasia of prostate    Erectile dysfunction    Frequent falls    Heart failure with mildly reduced ejection fraction (HFmrEF) (HCC)    a.) TTE 10/23/2015: EF 60-65%, mod-sev LVH, mild biatrial dil, degen MV disease, AoV sclerosis, asc Ao 38 mm, G1DD; b.) TTE 08/05/2022: EF 45-50%, mod-sev LVH (speckled pattern), sev biatrial dil, mild MR, AoV sclerosis, G1DD   Hepatic flexure mass 08/23/2022   a.) colonoscopy 08/23/2022: 14 x 15 mm partially obstructing ulcerated distal ascending colon mass; tissue friable --> pathology resulted as tubular adenoma, however felt to be false (-) --> referral to sugery and colectomy recommended; b.) s/p RIGHT hemicolectomy 09/28/2022   History of MRSA infection 08/28/2022   a.) MRSA PCR (+) 08/28/2022; culture from LEFT thigh abscess   HTN (hypertension)    Hyperlipidemia    Hypogonadism in male    Hypothyroidism    IDA (iron  deficiency anemia)    LBBB (left bundle branch block)    Long-term  use of aspirin  therapy    Lumbar spinal stenosis    Mild cardiomegaly    Multiple lung nodules on CT    NICM (nonischemic cardiomyopathy) (HCC)    a.) TTE 10/23/2015: EF 60-65; b.) TTE 08/05/2022: EF 45-50%   Orchitis and epididymitis 06/16/2013   OSA on CPAP    Palindromic rheumatism, hand    Pericardial effusion    Prostatitis    Sepsis (HCC) 08/2022   Subdural hematoma (HCC) 10/22/2015   a.) s/p traumatic mechanical fall --> CT head 10/22/2015: high-density SDH the left cerebral convexity with13 mm midline shift --> s/p LEFT frontal burr hole craniotomy   Tubular adenoma of colon    Ventral hernia      SURGICAL HISTORY    Past Surgical History:  Procedure Laterality Date   BACK SURGERY     BIOPSY  08/21/2022   Procedure: BIOPSY;  Surgeon: Onita Elspeth Sharper, DO;  Location: [facility] ENDOSCOPY;  Service: Gastroenterology;;   BIOPSY  08/23/2022   Procedure: BIOPSY;  Surgeon: Onita Elspeth Sharper, DO;  Location: [facility] ENDOSCOPY;  Service: Gastroenterology;;   BIOPSY  02/28/2023   Procedure: BIOPSY;  Surgeon: Onita Elspeth Sharper, DO;  Location: [facility] ENDOSCOPY;  Service: Gastroenterology;;   SOLMON DAKIN OF CRANIUM Left 10/22/2015   COLON SURGERY  09/28/2022   COLONOSCOPY WITH PROPOFOL  N/A 08/23/2022   Procedure: COLONOSCOPY WITH PROPOFOL ;  Surgeon: Onita Elspeth Sharper, DO;  Location: [facility] ENDOSCOPY;  Service: Gastroenterology;  Laterality: N/A;   COLONOSCOPY WITH PROPOFOL  N/A 02/28/2023   Procedure: COLONOSCOPY WITH PROPOFOL ;  Surgeon: Onita Elspeth Sharper, DO;  Location: [facility] ENDOSCOPY;  Service: Gastroenterology;  Laterality: N/A;  DM   COLONOSCOPY WITH PROPOFOL  N/A 04/25/2023   Procedure: COLONOSCOPY WITH PROPOFOL ;  Surgeon: Onita Elspeth Sharper, DO;  Location: [facility] ENDOSCOPY;  Service: Gastroenterology;  Laterality: N/A;  DM   ESOPHAGOGASTRODUODENOSCOPY (EGD) WITH PROPOFOL  N/A 08/21/2022   Procedure: ESOPHAGOGASTRODUODENOSCOPY (EGD) WITH PROPOFOL ;  Surgeon: Onita Elspeth Sharper, DO;  Location: [facility] ENDOSCOPY;  Service: Gastroenterology;  Laterality: N/A;   ESOPHAGOGASTRODUODENOSCOPY (EGD) WITH PROPOFOL  N/A 02/28/2023   Procedure: ESOPHAGOGASTRODUODENOSCOPY (EGD) WITH PROPOFOL ;  Surgeon: Onita Elspeth Sharper, DO;  Location: [facility] ENDOSCOPY;  Service: Gastroenterology;  Laterality: N/A;   GIVENS CAPSULE STUDY N/A 08/23/2022   Procedure: GIVENS CAPSULE STUDY;  Surgeon: Onita Elspeth Sharper, DO;  Location: [facility] ENDOSCOPY;  Service: Gastroenterology;  Laterality: N/A;   HERNIA REPAIR     INGUINAL HERNIA REPAIR Right 03/09/2023   Procedure: HERNIA REPAIR INGUINAL ADULT, open, RNFA to assist;  Surgeon: Jordis Laneta FALCON, MD;  Location: [facility] ORS;  Service: General;  Laterality: Right;   IR BONE MARROW BIOPSY & ASPIRATION  08/19/2023   IR US  GUIDE BX ASP/DRAIN  06/27/2023   IRRIGATION AND DEBRIDEMENT ABSCESS Left 08/28/2022   Procedure: IRRIGATION AND DEBRIDEMENT ABSCESS LEFT UPPER THIGH/GROIN;  Surgeon: Jordis Laneta FALCON, MD;  Location: [facility] ORS;  Service: General;  Laterality: Left;   LAPAROSCOPIC RIGHT COLECTOMY Right 09/28/2022   Procedure: LAPAROSCOPIC RIGHT COLECTOMY, RNFA to assist;  Surgeon: Jordis Laneta FALCON, MD;  Location: [facility] ORS;  Service: General;  Laterality: Right;   POLYPECTOMY  08/23/2022   Procedure: POLYPECTOMY;  Surgeon: Onita Elspeth Sharper, DO;  Location: [facility] ENDOSCOPY;  Service: Gastroenterology;;   POLYPECTOMY  04/25/2023   Procedure: POLYPECTOMY;  Surgeon: Onita Elspeth Sharper, DO;  Location: [facility] ENDOSCOPY;  Service: Gastroenterology;;   SUBMUCOSAL TATTOO INJECTION  08/23/2022   Procedure: SUBMUCOSAL TATTOO INJECTION;  Surgeon: Onita Elspeth Sharper, DO;  Location: [facility] ENDOSCOPY;  Service:  Gastroenterology;;   TONSILLECTOMY     VENTRAL HERNIA REPAIR N/A 09/28/2022   Procedure: HERNIA REPAIR VENTRAL ADULT;  Surgeon: Jordis Laneta FALCON, MD;  Location: [facility] ORS;  Service: General;  Laterality: N/A;     FAMILY HISTORY   Family History  Problem Relation Age of Onset   Cancer Maternal Aunt    ADD / ADHD Daughter    Asthma Son    Prostate cancer Neg Hx    Kidney disease Neg Hx    Kidney cancer Neg Hx    Bladder Cancer Neg Hx      SOCIAL HISTORY   Social History   Tobacco Use   Smoking status: Former    Types: Cigarettes    Passive exposure: Past   Smokeless tobacco: Never   Tobacco comments:    Stop smoking iver 40  plus years ago  Vaping Use   Vaping status: Never Used  Substance Use Topics   Alcohol use: No   Drug use: No     MEDICATIONS    Home Medication:  Current Outpatient Rx   Order #: [id number] Class: Normal    Current Medication:  Current  Facility-Administered Medications:    oxyCODONE  (Oxy IR/ROXICODONE ) immediate release tablet 10 mg, 10 mg, Oral, Q8H PRN **AND** acetaminophen  (TYLENOL ) tablet 325 mg, 325 mg, Oral, Q8H PRN, Hunt, Madison H, RPH   acetaminophen  (TYLENOL ) tablet 650 mg, 650 mg, Oral, Q4H PRN, Laurita Cort DASEN, MD   aspirin  EC tablet 81 mg, 81 mg, Oral, Daily, Laurita, Ping T, MD, 81 mg at 11/24/23 9167   benzonatate (TESSALON) capsule 200 mg, 200 mg, Oral, TID PRN, Laurita Cort DASEN, MD   budesonide-glycopyrrolate -formoterol (BREZTRI) 160-9-4.8 MCG/ACT inhaler 2 puff, 2 puff, Inhalation, BID, Laurita Cort T, MD, 2 puff at 11/24/23 0831   chlorpheniramine-HYDROcodone  (TUSSIONEX) 10-8 MG/5ML suspension 5 mL, 5 mL, Oral, Q12H PRN, Von Bellis, MD   doxycycline  (VIBRA -TABS) tablet 100 mg, 100 mg, Oral, Q12H, Araya Roel, MD, 100 mg at 11/24/23 9167   empagliflozin  (JARDIANCE ) tablet 10 mg, 10 mg, Oral, QAC breakfast, Von Bellis, MD, 10 mg at 11/24/23 9166   enoxaparin  (LOVENOX ) injection 40 mg, 40 mg, Subcutaneous, QHS, Zhang, Ping T, MD, 40 mg at 11/23/23 2128   furosemide  (LASIX ) injection 40 mg, 40 mg, Intravenous, BID, Laurita Cort T, MD, 40 mg at 11/24/23 0831   guaiFENesin  (MUCINEX ) 12 hr tablet 600 mg, 600 mg, Oral, BID, Von Bellis, MD, 600 mg at 11/24/23 9167   hydrALAZINE  (APRESOLINE ) tablet 100 mg, 100 mg, Oral, BID, Zhang, Ping T, MD, 100 mg at 11/24/23 9167   insulin  aspart (novoLOG ) injection 0-15 Units, 0-15 Units, Subcutaneous, TID WC, Laurita Cort DASEN, MD, 5 Units at 11/23/23 1731   ipratropium-albuterol  (DUONEB) 0.5-2.5 (3) MG/3ML nebulizer solution 3 mL, 3 mL, Nebulization, Q6H PRN, Von Bellis, MD   levothyroxine  (SYNTHROID ) tablet 200 mcg, 200 mcg, Oral, Q0600, Laurita Cort T, MD, 200 mcg at 11/24/23 0521   loratadine (CLARITIN) tablet 10 mg, 10 mg, Oral, Daily, Von Bellis, MD, 10 mg at 11/24/23 9167   methylPREDNISolone  sodium succinate (SOLU-MEDROL ) 40 mg/mL injection 40 mg, 40 mg, Intravenous,  Q24H, Darrelle Barrell, MD, 40 mg at 11/24/23 0831   metoprolol  succinate (TOPROL -XL) 24 hr tablet 25 mg, 25 mg, Oral, Daily, Von Bellis, MD, 25 mg at 11/24/23 9167   mirtazapine (REMERON) tablet 7.5 mg, 7.5 mg, Oral, QHS, Zhang, Ping T, MD, 7.5 mg at 11/23/23 2128   ondansetron  (ZOFRAN ) injection 4 mg, 4 mg, Intravenous, Q6H  PRN, Laurita Cort DASEN, MD   sacubitril -valsartan  (ENTRESTO ) 24-26 mg per tablet, 1 tablet, Oral, BID, Zhang, Ping T, MD, 1 tablet at 11/24/23 0831   sodium chloride  flush (NS) 0.9 % injection 3 mL, 3 mL, Intravenous, Q12H, Laurita Cort T, MD, 3 mL at 11/24/23 9166   sodium chloride  flush (NS) 0.9 % injection 3 mL, 3 mL, Intravenous, PRN, Zhang, Ping T, MD   spironolactone  (ALDACTONE ) tablet 12.5 mg, 12.5 mg, Oral, Daily, Von Bellis, MD, 12.5 mg at 11/24/23 9167    ALLERGIES   Patient has no known allergies.     REVIEW OF SYSTEMS    Review of Systems:  Gen:  Denies  fever, sweats, chills weigh loss  HEENT: Denies blurred vision, double vision, ear pain, eye pain, hearing loss, nose bleeds, sore throat Cardiac:  No dizziness, chest pain or heaviness, chest tightness,edema Resp:   reports dyspnea chronically  Gi: Denies swallowing difficulty, stomach pain, nausea or vomiting, diarrhea, constipation, bowel incontinence Gu:  Denies bladder incontinence, burning urine Ext:   Denies Joint pain, stiffness or swelling Skin: Denies  skin rash, easy bruising or bleeding or hives Endoc:  Denies polyuria, polydipsia , polyphagia or weight change Psych:   Denies depression, insomnia or hallucinations   Other:  All other systems negative   VS: BP 138/83 (BP Location: Right Arm)   Pulse 68   Temp 98.3 F (36.8 C) (Oral)   Resp 18   Ht 6' 1 (1.854 m)   Wt 84.5 kg   SpO2 93%   BMI 24.58 kg/m      PHYSICAL EXAM    GENERAL:NAD, no fevers, chills, no weakness no fatigue HEAD: Normocephalic, atraumatic.  EYES: Pupils equal, round, reactive to light.  Extraocular muscles intact. No scleral icterus.  MOUTH: Moist mucosal membrane. Dentition intact. No abscess noted.  EAR, NOSE, THROAT: Clear without exudates. No external lesions.  NECK: Supple. No thyromegaly. No nodules. No JVD.  PULMONARY: decreased breath sounds with mild rhonchi worse at bases bilaterally.  CARDIOVASCULAR: S1 and S2. Regular rate and rhythm. No murmurs, rubs, or gallops. No edema. Pedal pulses 2+ bilaterally.  GASTROINTESTINAL: Soft, nontender, nondistended. No masses. Positive bowel sounds. No hepatosplenomegaly.  MUSCULOSKELETAL: No swelling, clubbing, or edema. Range of motion full in all extremities.  NEUROLOGIC: Cranial nerves II through XII are intact. No gross focal neurological deficits. Sensation intact. Reflexes intact.  SKIN: No ulceration, lesions, rashes, or cyanosis. Skin warm and dry. Turgor intact.  PSYCHIATRIC: Mood, affect within normal limits. The patient is awake, alert and oriented x 3. Insight, judgment intact.       IMAGING     EXAM: CT CHEST WITHOUT CONTRAST 11/09/2023 11:28:35 AM  TECHNIQUE: CT of the chest was performed without the administration of intravenous contrast. Multiplanar reformatted images are provided for review. Automated exposure control, iterative reconstruction, and/or weight based adjustment of the mA/kV was utilized to reduce the radiation dose to as low as reasonably achievable.  COMPARISON: CT angiogram chest 04/27/2023.  CLINICAL HISTORY: Cough that has been going on for a while but worse over last several days with productive component; SOB last several days; hx of leukemia; no surg former smoker quit about 40 years ago.  FINDINGS:  MEDIASTINUM: Heart is moderately enlarged, unchanged. There is a trace pericardial effusion. There are atherosclerotic calcifications of the aorta. The central airways are clear.  LYMPH NODES: There are numerous prominent and nonenlarged axillary lymph nodes. There  are additional enlarged left axillary lymph nodes measuring up  to 16 mm which have increased in size and number compared to prior. There is a precarinal enlarged lymph node measuring 15 mm which is new from prior. Enlarged subcarinal lymph node measures 15 mm, new from prior.  LUNGS AND PLEURA: There are trace bilateral pleural effusions. There are scattered ground glass opacities and peripheral interstitial opacities throughout both lungs. There are some patchy ground-glass opacities in the bilateral lower lobes, left greater than right and minimal patchy airspace opacities in the right middle lobe. There is central peribronchial wall thickening bilaterally. Nodular density seen in the right lower lobe measuring 7 mm image 3/91. There are few more additional scattered right lower lobe 3 mm nodular densities. These are new from prior. No pneumothorax.  SOFT TISSUES/BONES: There is diffuse chest wall edema. No acute abnormality of the bones.  UPPER ABDOMEN: Right renal cysts are partially visualized. Limited images of the upper abdomen demonstrates no acute abnormality.  IMPRESSION: 1. Scattered ground glass and peripheral interstitial opacities throughout both lungs, with patchy ground-glass opacities in the bilateral lower lobes (left greater than right) and minimal patchy airspace opacities in the right middle lobe. Findings are favored as infectious/inflammatory, although edema may be superimposed. 2. New and increased size and number of enlarged left axillary lymph nodes, measuring up to 16 mm. 3. New precarinal and subcarinal enlarged lymph nodes, each measuring 15 mm. 4. New 7 mm nodular density in the right lower lobe with additional scattered 3 mm nodular densities. Per Fleischner Society Guidelines recommend a non-contrast chest CT at 3-6 months, then consider another non-contrast chest CT at 18-24 months. If patient is low risk for malignancy, non-contrast chest CT at  18-24 months is optional. 5. Trace bilateral pleural effusions. 6. Moderately enlarged heart with trace pericardial effusion, unchanged from prior.  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   Patient/Family are satisfied with care plan and all questions have been answered.    Provider disclosure: Patient with at least one acute or chronic illness or injury that poses a threat to life or bodily function and is being managed actively during this encounter.  All of the below services have been performed independently by signing provider:  review of prior documentation from internal and or external health records.  Review of previous and current lab results.  Interview and comprehensive assessment during patient visit today. Review of current and previous chest radiographs/CT scans. Discussion of management and test interpretation with health care team and patient/family.   This document was prepared using Dragon voice recognition software and may include unintentional dictation errors.     Lamerle Jabs, M.D.  Division of  Pulmonary & Critical Care Medicine

## 2023-11-24 NOTE — Progress Notes (Signed)
 PROGRESS NOTE    Paul Daft [name]  FMW:[id number] DOB: 24-Aug-1945 DOA: 11/21/2023 PCP: Cyrus Selinda Moose, PA-C   Assessment & Plan:   Principal Problem:   CHF (congestive heart failure) (HCC) Active Problems:   Acute on chronic combined systolic and diastolic CHF (congestive heart failure) (HCC)  Assessment and Plan: Acute on chronic combined CHF exacerbation: continue on IV lasix . Monitor I/Os. Neg approx 2.5 L currently. Continue on entresto , hydralazine      COPD exacerbation: completed azithromycin course. Continue on doxy, steroids, bronchodilators as per pulmon. Encourage incentive spirometry. Pulmon following and recs apprec   HTN: continue on home dose of entresto , aldactone , metoprolol , hydralazine    H/o CLL: management per onco outpatient    Hypothyroidism: continue on levothyroxine    DM2: well controlled, HbA1c 5.5. Continue on SSI w/ accuchecks.  Holding home metformin   Hx of OSA: does not use CPAP at home. CPAP at bedtime prn       DVT prophylaxis: lovenox  Code Status: full  Family Communication: discussed pt's care w/ daughter, [name], and answered her questions  Disposition Plan: likely d/c back home   Level of care: Telemetry Cardiac  Status is: Inpatient Remains inpatient appropriate because: severity of illness    Consultants:  pulmon  Procedures:   Antimicrobials: azithromycin, doxy   Subjective: Pt c/o rattling in my chest.  Objective: Vitals:   11/24/23 0808 11/24/23 1159 11/24/23 1208 11/24/23 1512  BP: 138/83 (!) 146/78  102/74  Pulse: 68 66  75  Resp:      Temp: 98.3 F (36.8 C) 98.2 F (36.8 C)  97.9 F (36.6 C)  TempSrc: Oral Oral  Oral  SpO2: 93% 95%  97%  Weight:   86.6 kg   Height:        Intake/Output Summary (Last 24 hours) at 11/24/2023 1539 Last data filed at 11/24/2023 1456 Gross per 24 hour  Intake 483 ml  Output 3030 ml  Net -2547 ml   Filed Weights   11/23/23 0455 11/24/23 0500  11/24/23 1208  Weight: 87.8 kg 84.5 kg 86.6 kg    Examination:  General exam:appears comfortable  Respiratory system: course breath sounds b/l. Intermittent rales Cardiovascular system: S1/S2+. No rubs or gallops Gastrointestinal system: abd is soft, NT, ND & hypoactive bowel sounds Central nervous system: alert & oriented. Moves all extremities Psychiatry: Judgement and insight appears at baseline. Flat mood and affect   Data Reviewed: I have personally reviewed following labs and imaging studies  CBC: Recent Labs  Lab 11/21/23 1235 11/23/23 0517 11/24/23 0515  WBC 10.6* 14.2* 14.7*  HGB 9.9* 10.2* 10.5*  HCT 32.5* 32.9* 33.9*  MCV 91.8 90.4 91.9  PLT 246 252 243   Basic Metabolic Panel: Recent Labs  Lab 11/21/23 1235 11/22/23 0535 11/22/23 0947 11/23/23 0517 11/24/23 0515  NA 138 141  --  143 142  K 4.0 3.8  --  3.6 3.7  CL 107 109  --  107 108  CO2 22 25  --  25 25  GLUCOSE 103* 116*  --  126* 115*  BUN 18 18  --  22 25*  CREATININE 1.00 0.94  --  0.99 1.11  CALCIUM  8.7* 8.1*  --  8.3* 8.3*  MG  --   --  2.2 2.3 2.3  PHOS  --   --  2.5 2.2*  --    GFR: Estimated Creatinine Clearance: 63 mL/min (by C-G formula based on SCr of 1.11 mg/dL). Liver Function Tests: No results for  input(s): AST, ALT, ALKPHOS, BILITOT, PROT, ALBUMIN in the last 168 hours. No results for input(s): LIPASE, AMYLASE in the last 168 hours. No results for input(s): AMMONIA in the last 168 hours. Coagulation Profile: No results for input(s): INR, PROTIME in the last 168 hours. Cardiac Enzymes: Recent Labs  Lab 11/23/23 0517  CKTOTAL 151   BNP (last 3 results) No results for input(s): PROBNP in the last 8760 hours. HbA1C: Recent Labs    11/21/23 1725  HGBA1C 5.5   CBG: Recent Labs  Lab 11/23/23 1134 11/23/23 1646 11/23/23 2055 11/24/23 0809 11/24/23 1159  GLUCAP 154* 206* 124* 108* 183*   Lipid Profile: Recent Labs    11/23/23 0517  CHOL  204*  HDL 49  LDLCALC 139*  TRIG 81  CHOLHDL 4.2   Thyroid  Function Tests: No results for input(s): TSH, T4TOTAL, FREET4, T3FREE, THYROIDAB in the last 72 hours. Anemia Panel: No results for input(s): VITAMINB12, FOLATE, FERRITIN, TIBC, IRON , RETICCTPCT in the last 72 hours. Sepsis Labs: Recent Labs  Lab 11/22/23 0947  PROCALCITON <0.10    Recent Results (from the past 240 hours)  Resp panel by RT-PCR (RSV, Flu A&B, Covid) Anterior Nasal Swab     Status: None   Collection Time: 11/21/23 12:35 PM   Specimen: Anterior Nasal Swab  Result Value Ref Range Status   SARS Coronavirus 2 by RT PCR NEGATIVE NEGATIVE Final    Comment: (NOTE) SARS-CoV-2 target nucleic acids are NOT DETECTED.  The SARS-CoV-2 RNA is generally detectable in upper respiratory specimens during the acute phase of infection. The lowest concentration of SARS-CoV-2 viral copies this assay can detect is 138 copies/mL. A negative result does not preclude SARS-Cov-2 infection and should not be used as the sole basis for treatment or other patient management decisions. A negative result may occur with  improper specimen collection/handling, submission of specimen other than nasopharyngeal swab, presence of viral mutation(s) within the areas targeted by this assay, and inadequate number of viral copies(<138 copies/mL). A negative result must be combined with clinical observations, patient history, and epidemiological information. The expected result is Negative.  Fact Sheet for Patients:  bloggercourse.com  Fact Sheet for Healthcare Providers:  seriousbroker.it  This test is no t yet approved or cleared by the [place]  FDA and  has been authorized for detection and/or diagnosis of SARS-CoV-2 by FDA under an Emergency Use Authorization (EUA). This EUA will remain  in effect (meaning this test can be used) for the duration of the COVID-19  declaration under Section 564(b)(1) of the Act, 21 U.S.C.section 360bbb-3(b)(1), unless the authorization is terminated  or revoked sooner.       Influenza A by PCR NEGATIVE NEGATIVE Final   Influenza B by PCR NEGATIVE NEGATIVE Final    Comment: (NOTE) The Xpert Xpress SARS-CoV-2/FLU/RSV plus assay is intended as an aid in the diagnosis of influenza from Nasopharyngeal swab specimens and should not be used as a sole basis for treatment. Nasal washings and aspirates are unacceptable for Xpert Xpress SARS-CoV-2/FLU/RSV testing.  Fact Sheet for Patients: bloggercourse.com  Fact Sheet for Healthcare Providers: seriousbroker.it  This test is not yet approved or cleared by the [place]  FDA and has been authorized for detection and/or diagnosis of SARS-CoV-2 by FDA under an Emergency Use Authorization (EUA). This EUA will remain in effect (meaning this test can be used) for the duration of the COVID-19 declaration under Section 564(b)(1) of the Act, 21 U.S.C. section 360bbb-3(b)(1), unless the authorization is terminated or revoked.  Resp Syncytial Virus by PCR NEGATIVE NEGATIVE Final    Comment: (NOTE) Fact Sheet for Patients: bloggercourse.com  Fact Sheet for Healthcare Providers: seriousbroker.it  This test is not yet approved or cleared by the [place]  FDA and has been authorized for detection and/or diagnosis of SARS-CoV-2 by FDA under an Emergency Use Authorization (EUA). This EUA will remain in effect (meaning this test can be used) for the duration of the COVID-19 declaration under Section 564(b)(1) of the Act, 21 U.S.C. section 360bbb-3(b)(1), unless the authorization is terminated or revoked.  Performed at [facility], [address] [place], [place] [address]   MRSA Next Gen by PCR, Nasal     Status: Abnormal   Collection Time: 11/23/23  8:15  AM   Specimen: Nasal Mucosa; Nasal Swab  Result Value Ref Range Status   MRSA by PCR Next Gen DETECTED (A) NOT DETECTED Final    Comment: RESULT CALLED TO, READ BACK BY AND VERIFIED WITH: MARINDA ADAM, RN 1020 11/23/23 GM (NOTE) The GeneXpert MRSA Assay (FDA approved for NASAL specimens only), is one component of a comprehensive MRSA colonization surveillance program. It is not intended to diagnose MRSA infection nor to guide or monitor treatment for MRSA infections. Test performance is not FDA approved in patients less than 19 years old. Performed at [facility], [address] [place], [place] [address]   Respiratory (~20 pathogens) panel by PCR     Status: None   Collection Time: 11/23/23  8:15 AM   Specimen: Sputum; Respiratory  Result Value Ref Range Status   Adenovirus NOT DETECTED NOT DETECTED Final   Coronavirus 229E NOT DETECTED NOT DETECTED Final    Comment: (NOTE) The Coronavirus on the Respiratory Panel, DOES NOT test for the novel  Coronavirus (2019 nCoV)    Coronavirus HKU1 NOT DETECTED NOT DETECTED Final   Coronavirus NL63 NOT DETECTED NOT DETECTED Final   Coronavirus OC43 NOT DETECTED NOT DETECTED Final   Metapneumovirus NOT DETECTED NOT DETECTED Final   Rhinovirus / Enterovirus NOT DETECTED NOT DETECTED Final   Influenza A NOT DETECTED NOT DETECTED Final   Influenza B NOT DETECTED NOT DETECTED Final   Parainfluenza Virus 1 NOT DETECTED NOT DETECTED Final   Parainfluenza Virus 2 NOT DETECTED NOT DETECTED Final   Parainfluenza Virus 3 NOT DETECTED NOT DETECTED Final   Parainfluenza Virus 4 NOT DETECTED NOT DETECTED Final   Respiratory Syncytial Virus NOT DETECTED NOT DETECTED Final   Bordetella pertussis NOT DETECTED NOT DETECTED Final   Bordetella Parapertussis NOT DETECTED NOT DETECTED Final   Chlamydophila pneumoniae NOT DETECTED NOT DETECTED Final   Mycoplasma pneumoniae NOT DETECTED NOT DETECTED Final    Comment: Performed at [facility], [address] [place], [place] [address]         Radiology Studies: No results found.       Scheduled Meds:  aspirin  EC  81 mg Oral Daily   budesonide-glycopyrrolate -formoterol  2 puff Inhalation BID   doxycycline   100 mg Oral Q12H   empagliflozin   10 mg Oral QAC breakfast   enoxaparin  (LOVENOX ) injection  40 mg Subcutaneous QHS   furosemide   40 mg Intravenous BID   guaiFENesin   600 mg Oral BID   hydrALAZINE   100 mg Oral BID   insulin  aspart  0-15 Units Subcutaneous TID WC   levothyroxine   200 mcg Oral Q0600   loratadine  10 mg Oral Daily   methylPREDNISolone  (SOLU-MEDROL ) injection  40 mg Intravenous Q24H  metoprolol  succinate  25 mg Oral Daily   mirtazapine  7.5 mg Oral QHS   sacubitril -valsartan   1 tablet Oral BID   sodium chloride  flush  3 mL Intravenous Q12H   spironolactone   12.5 mg Oral Daily   Continuous Infusions:   LOS: 2 days       Anthony CHRISTELLA Pouch, MD [facility] Pager [phone]  If 7PM-7AM, please contact night-coverage [email or web address] 11/24/2023, 3:39 PM

## 2023-11-24 NOTE — Plan of Care (Signed)
  Problem: Education: Goal: Knowledge of General Education information will improve Description: Including pain rating scale, medication(s)/side effects and non-pharmacologic comfort measures Outcome: Progressing   Problem: Health Behavior/Discharge Planning: Goal: Ability to manage health-related needs will improve Outcome: Progressing   Problem: Activity: Goal: Risk for activity intolerance will decrease Outcome: Progressing   Problem: Nutrition: Goal: Adequate nutrition will be maintained Outcome: Progressing   Problem: Elimination: Goal: Will not experience complications related to urinary retention Outcome: Progressing   Problem: Pain Managment: Goal: General experience of comfort will improve and/or be controlled Outcome: Progressing   Problem: Safety: Goal: Ability to remain free from injury will improve Outcome: Progressing   Problem: Skin Integrity: Goal: Risk for impaired skin integrity will decrease Outcome: Progressing

## 2023-11-24 NOTE — Care Management Important Message (Signed)
 Important Message  Patient Details  Name: Paul Trumbull [name] MRN: [id number] Date of Birth: 04/03/1945   Important Message Given:  Yes - Medicare IM     Paul Briggs 11/24/2023, 4:35 PM

## 2023-11-24 NOTE — Progress Notes (Signed)
 Heart Failure Stewardship Pharmacy Note  PCP: Cyrus Selinda Moose, PA-C PCP-Cardiologist: Redell Cave, MD  HPI: Paul Damore [name] is a 77 y.o. 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 On admission, BNP was 1856.6, HS-troponin was 42, and A1c 5.5. Chest x-ray noted mild interstitial edema and bilateral pleural effusions concerning for mild CHF.   Pertinent cardiac history: TTE 07/2022 with LVEF 45-50%. Cardiac CTA 08/2022 showed coronary calcium  score of 14.3, 12th percentile for age and sex matched control, minimal proximal LAD stenosis, overall minimal nonobstructive CAD. cMRI 03/2023 showed EF 43% Mod-sev basal septal hypertrophy (16mm) RVEF 48% No LGE Mild MR, suggestive of non ischemic cardiomyopathy. Possible inflammatory disease involving the basal lateral wall. TTE pending this admission noted LVEF down to 35-40%, moderate LVH, G2DD, mildly reduced RV function, mild AR.   Pertinent Lab Values: Creatinine  Date Value Ref Range Status  10/03/2023 1.21 0.61 - 1.24 mg/dL Final   Creat  Date Value Ref Range Status  07/06/2021 1.08 0.70 - 1.28 mg/dL Final   Creatinine, Ser  Date Value Ref Range Status  11/24/2023 1.11 0.61 - 1.24 mg/dL Final   BUN  Date Value Ref Range Status  11/24/2023 25 (H) 8 - 23 mg/dL Final  88/72/7975 12 8 - 27 mg/dL Final  90/87/7984 27 (H) 7 - 18 mg/dL Final   Potassium  Date Value Ref Range Status  11/24/2023 3.7 3.5 - 5.1 mmol/L Final  10/06/2013 4.1 3.5 - 5.1 mmol/L Final   Sodium  Date Value Ref Range Status  11/24/2023 142 135 - 145 mmol/L Final  12/22/2022 146 (H) 134 - 144 mmol/L Final  10/06/2013 139 136 - 145 mmol/L Final   B Natriuretic Peptide  Date Value Ref Range Status  11/22/2023 1,857.1 (H) 0.0 - 100.0 pg/mL Final    Comment:     Performed at [facility], [address] [place], [place] [address]   Magnesium  Date Value Ref Range Status  11/24/2023 2.3 1.7 - 2.4 mg/dL Final    Comment:    Performed at [facility], [address] [place], [place] [address]   HbA1c POC (<> result, manual entry)  Date Value Ref Range Status  07/06/2021 6.5 4.0 - 5.6 % Final   Hgb A1c MFr Bld  Date Value Ref Range Status  11/21/2023 5.5 4.8 - 5.6 % Final    Comment:    (NOTE) Diagnosis of Diabetes The following HbA1c ranges recommended by the American Diabetes Association (ADA) may be used as an aid in the diagnosis of diabetes mellitus.  Hemoglobin             Suggested A1C NGSP%              Diagnosis  <5.7                   Non Diabetic  5.7-6.4                Pre-Diabetic  >6.4                   Diabetic  <7.0                   Glycemic control for  adults with diabetes.     TSH  Date Value Ref Range Status  06/29/2023 28.297 (H) 0.350 - 4.500 uIU/mL Final    Comment:    Performed by a 3rd Generation assay with a functional sensitivity of <=0.01 uIU/mL. Performed at [facility], [address] [place], [place] [address]   12/22/2022 27.300 (H) 0.450 - 4.500 uIU/mL Final   LDH  Date Value Ref Range Status  11/01/2023 285 (H) 98 - 192 U/L Final    Comment:    Performed at [facility], [address] [place], [place] [address]    Vital Signs:  Temp:  [97.9 F (36.6 C)-98.7 F (37.1 C)] 98.3 F (36.8 C) (10/30 0808) Pulse Rate:  [64-71] 68 (10/30 0808) Cardiac Rhythm: Normal sinus rhythm;Bundle branch block (10/29 1934) Resp:  [14-18] 18 (10/30 0347) BP: (121-144)/(70-87) 138/83 (10/30 0808) SpO2:  [93 %-100 %] 93 % (10/30 0808) Weight:  [84.5 kg (186 lb 4.6 oz)] 84.5 kg (186 lb 4.6 oz) (10/30 0500)  Intake/Output Summary (Last 24 hours) at 11/24/2023 0936 Last data filed at 11/24/2023 0200 Gross per 24 hour  Intake 243 ml   Output 2650 ml  Net -2407 ml    Current Heart Failure Medications:  Loop diuretic: furosemide  40 mg IV BID Beta-Blocker: none ACEI/ARB/ARNI: Entresto  24-26 mg BID MRA: spironolactone  12.5 mg daily SGLT2i: Jardiance  10 mg daily Other: hydralazine  100 mg BID  Prior to admission Heart Failure Medications:  Loop diuretic: furosemide  20 mg prn Beta-Blocker: metoprolol  succinate 25 mg daily (not taking) ACEI/ARB/ARNI: Entresto  97-103 mg BID (not taking) MRA: spironolactone  25 mg daily (not taking) SGLT2i: Jardiance  10 mg daily Other: hydralazine  100 mg BID  Assessment: 1. Acute on chronic systolic heart failure (LVEF 35-40%) with G2DD and mild RV dysfunction, due to NICM. NYHA class III symptoms.  -Symptoms: Reports feeling improved since admission. Still with productive cough. Complains of cyst on buttocks. Orthopnea is resolved. Appetite is fine.  -Volume: Appears to be euvolemic. Creatinine and BUN are slightly up. Consider transitioning to oral furosemide  20 mg daily -Hemodynamics: BP is elevated. HR 60s. -BB: Currently being held due to hypervolemia. Consider resuming today at 12.5 mg daily. -ACEI/ARB/ARNI: Entresto  resumed at lower dose of 24-26 mg BID. Can consider increasing today given BP remains elevated and tolerated 97-103 mg BID at home. -MRA: Consider increasing spironolactone  25 mg daily.  K <4. -SGLT2i: Continue Jardiance  10 mg daily  Plan: 1) Medication changes recommended at this time: -Consider increasing spironolactone  to 25 mg daily -Consider adding metoprolol  succinate 12.5 mg daily  2) Patient assistance: -Pending  3) Education: - Patient has been educated on current HF medications and potential additions to HF medication regimen - Patient verbalizes understanding that over the next few months, these medication doses may change and more medications may be added to optimize HF regimen - Patient has been educated on basic disease state pathophysiology and  goals of therapy  Please do not hesitate to reach out with questions or concerns,  Jaun Bash, PharmD, CPP, BCPS, [facility] Heart Failure Pharmacist  Phone - [phone] 11/24/2023 9:36 AM

## 2023-11-24 NOTE — Progress Notes (Signed)
 Mobility Specialist - Progress Note   11/24/23 0900  Mobility  Activity Ambulated independently  Level of Assistance Independent after set-up  Assistive Device None  Distance Ambulated (ft) 200 ft  Range of Motion/Exercises All extremities  Activity Response Tolerated well  Mobility visit 1 Mobility  Mobility Specialist Start Time (ACUTE ONLY) [id number]  Mobility Specialist Stop Time (ACUTE ONLY) [id number]  Mobility Specialist Time Calculation (min) (ACUTE ONLY) 9 min   Pt was supine in bed on RA. Pt agreed to mobility. Pt is able to independently get to the EOB with no AD. Pt is independently able to STS with no AD. Pt ambulated well. Pt had no recovery breaks during activity. After activity pt returned to the room with needs in reach and nurse in room upon exit.  Clem Rodes Mobility Specialist 11/24/23, 9:27 AM

## 2023-11-25 ENCOUNTER — Inpatient Hospital Stay

## 2023-11-25 DIAGNOSIS — J441 Chronic obstructive pulmonary disease with (acute) exacerbation: Secondary | ICD-10-CM | POA: Diagnosis not present

## 2023-11-25 DIAGNOSIS — L0231 Cutaneous abscess of buttock: Secondary | ICD-10-CM

## 2023-11-25 LAB — GLUCOSE, CAPILLARY
Glucose-Capillary: 118 mg/dL — ABNORMAL HIGH (ref 70–99)
Glucose-Capillary: 268 mg/dL — ABNORMAL HIGH (ref 70–99)
Glucose-Capillary: 249 mg/dL — ABNORMAL HIGH (ref 70–99)
Glucose-Capillary: 200 mg/dL — ABNORMAL HIGH (ref 70–99)
Glucose-Capillary: 140 mg/dL — ABNORMAL HIGH (ref 70–99)

## 2023-11-25 LAB — C-REACTIVE PROTEIN: CRP: 0.8 mg/dL (ref <1.0)

## 2023-11-25 LAB — CBC
HCT: 34.9 % — ABNORMAL LOW (ref 39.0–52.0)
Hemoglobin: 11.1 g/dL — ABNORMAL LOW (ref 13.0–17.0)
MCH: 28.4 pg (ref 26.0–34.0)
MCHC: 31.8 g/dL (ref 30.0–36.0)
MCV: 89.3 fL (ref 80.0–100.0)
Platelets: 292 K/uL (ref 150–400)
RBC: 3.91 MIL/uL — ABNORMAL LOW (ref 4.22–5.81)
RDW: 18.7 % — ABNORMAL HIGH (ref 11.5–15.5)
WBC: 17.7 K/uL — ABNORMAL HIGH (ref 4.0–10.5)
nRBC: 0 % (ref 0.0–0.2)

## 2023-11-25 LAB — BASIC METABOLIC PANEL WITH GFR
Anion gap: 10 (ref 5–15)
BUN: 34 mg/dL — ABNORMAL HIGH (ref 8–23)
CO2: 26 mmol/L (ref 22–32)
Calcium: 8.6 mg/dL — ABNORMAL LOW (ref 8.9–10.3)
Chloride: 106 mmol/L (ref 98–111)
Creatinine, Ser: 1.1 mg/dL (ref 0.61–1.24)
GFR, Estimated: >60 mL/min (ref >60)
Glucose, Bld: 120 mg/dL — ABNORMAL HIGH (ref 70–99)
Potassium: 3.5 mmol/L (ref 3.5–5.1)
Sodium: 142 mmol/L (ref 135–145)

## 2023-11-25 LAB — MAGNESIUM: Magnesium: 2.2 mg/dL (ref 1.7–2.4)

## 2023-11-25 MED ORDER — PREDNISONE 50 MG PO TABS
45.0000 mg | ORAL_TABLET | Freq: Every day | ORAL | Status: AC
  Administered 2023-11-27: 45 mg via ORAL
  Filled 2023-11-25: qty 1

## 2023-11-25 MED ORDER — PREDNISONE 10 MG PO TABS: 15.0000 mg | ORAL_TABLET | Freq: Every day | ORAL | Status: DC

## 2023-11-25 MED ORDER — PREDNISONE 50 MG PO TABS: 50.0000 mg | ORAL_TABLET | Freq: Every day | ORAL | Status: DC

## 2023-11-25 MED ORDER — PREDNISONE 10 MG PO TABS: 10.0000 mg | ORAL_TABLET | Freq: Every day | ORAL | Status: DC

## 2023-11-25 MED ORDER — PREDNISONE 50 MG PO TABS
50.0000 mg | ORAL_TABLET | Freq: Every day | ORAL | Status: AC
  Administered 2023-11-26: 50 mg via ORAL
  Filled 2023-11-25: qty 1

## 2023-11-25 MED ORDER — PREDNISONE 20 MG PO TABS: 25.0000 mg | ORAL_TABLET | Freq: Every day | ORAL | Status: DC

## 2023-11-25 MED ORDER — PREDNISONE 10 MG PO TABS: 5.0000 mg | ORAL_TABLET | Freq: Every day | ORAL | Status: DC

## 2023-11-25 MED ORDER — PREDNISONE 20 MG PO TABS
40.0000 mg | ORAL_TABLET | Freq: Every day | ORAL | Status: AC
  Administered 2023-11-28: 40 mg via ORAL
  Filled 2023-11-25: qty 2

## 2023-11-25 MED ORDER — PREDNISONE 20 MG PO TABS: 20.0000 mg | ORAL_TABLET | Freq: Every day | ORAL | Status: DC

## 2023-11-25 MED ORDER — PREDNISONE 20 MG PO TABS
35.0000 mg | ORAL_TABLET | Freq: Every day | ORAL | Status: AC
  Administered 2023-11-29: 35 mg via ORAL
  Filled 2023-11-25: qty 2

## 2023-11-25 MED ORDER — PREDNISONE 10 MG PO TABS: 30.0000 mg | ORAL_TABLET | Freq: Every day | ORAL | Status: DC

## 2023-11-25 NOTE — Progress Notes (Signed)
 Mobility Specialist - Progress Note   11/25/23 1100  Mobility  Activity Ambulated with assistance;Stood at bedside;Dangled on edge of bed  Level of Assistance Independent after set-up  Assistive Device None  Distance Ambulated (ft) 205 ft  Range of Motion/Exercises All extremities  Activity Response Tolerated well  Mobility visit 1 Mobility  Mobility Specialist Start Time (ACUTE ONLY) 0845  Mobility Specialist Stop Time (ACUTE ONLY) 0901  Mobility Specialist Time Calculation (min) (ACUTE ONLY) 16 min   Pt was supine in bed on RA upon entry. Pt agreed to mobility. Pt did describe some discomfort on LB. Pt is able to get to the EOB independently with no bed features. Pt is able to STS independently with no AD. Pt did have a BM prior to mobility. Pt ambulated well. No recovery breaks during activity. After activity pt returned back to the room and into bed with all needs in reach.  Clem Rodes Mobility Specialist 11/25/23, 11:35 AM

## 2023-11-25 NOTE — Progress Notes (Signed)
 Mobility Specialist - Progress Note   11/25/23 1700  Mobility  Activity Ambulated with assistance;Stood at bedside  Level of Assistance Independent after set-up  Assistive Device None  Distance Ambulated (ft) 40 ft  Range of Motion/Exercises All extremities  Activity Response Tolerated well  Mobility visit 1 Mobility  Mobility Specialist Start Time (ACUTE ONLY) 1437  Mobility Specialist Stop Time (ACUTE ONLY) 1518  Mobility Specialist Time Calculation (min) (ACUTE ONLY) 41 min   Pt was in the bathroom upon entry. Pt agreed to mobility. Pt was able to ambulate throughout the room due to a procedure thereafter. Pt did state he was feeling better than earlier with activity. After activity was left in the room with needs in reach and a specialist was in the room upon exit.  Clem Rodes Mobility Specialist 11/25/23, 5:18 PM

## 2023-11-25 NOTE — Progress Notes (Signed)
 PULMONOLOGY         Date: 11/25/2023,   MRN# [id number] Paul Briggs. September 01, 1945     AdmissionWeight: 95.3 kg                 CurrentWeight: 87.7 kg  Referring provider: Dr Von    CHIEF COMPLAINT:   Severe COPD exacerbation   HISTORY OF PRESENT ILLNESS   [name] with chronic anemia, CAD, BPH, left adrenal adenoma, combined HFrEF and HFpEF, CLL, HTN, HLD, IIDM, hypothyroidism, CKD stage IIIa, BPH, SDH, OSA not on CPAP, chronic pain and narcotic dependence, COPD who came in to hospital with complaints of acute cough with wheezing.  Reports productive cough worse with exertion.  He finished course of augmentin  with no improvement, but did note worsening with supine position.  On arrival initial blood work showed mild leukocytosis, CXR with interstitial edema.  Patient requested that a pulmonologist to see him and PCCM consult placed for additional evaluation and management due to severe comorbid status.    10/31- patient of room air, breathing is improved, now off solumedrol with PO pred taper.  Can continue breztri on outpatient.  He diuresed 11L and is getting close to dc home phase of care.  PCCM signing off today and am available if needed anytime.   PAST MEDICAL HISTORY   Past Medical History:  Diagnosis Date   Adenoma of left adrenal gland    Anemia    Aortic atherosclerosis    Blood transfusion without reported diagnosis    BPH (benign prostatic hyperplasia)    Bradycardia    CAD (coronary artery disease) 08/26/2022   a.) cCTA 08/26/2022: Ca2+ 14.3 (12th %'ile for age/sex.race match control); (<25%) pLAD   Cellulitis of left thigh 08/2022   Chronic pain syndrome    a.) on COT managed by pain management   Chronic, continuous use of opioids    a.) chronic pain syndrome/chronic back pain; managed by pain management   CKD (chronic kidney disease) stage 3, GFR 30-59 ml/min (HCC)    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   a.)  Rai stage III   DDD (degenerative disc disease), cervical    Difficult airway 10/22/2015   a.) 1st attempt with glidescope  --> macroglossic (unsuccessful); b.) 2nd attempt with Mac 4 and direct laryngoscopy with a 8.0 ETT --> tube too large to pass through the cords; b.) 3rd attempt (successful) with a Mac 4 and a 7.5 tube   Diverticulosis    DM (diabetes mellitus), type 2 (HCC)    Dysplasia of prostate    Erectile dysfunction    Frequent falls    Heart failure with mildly reduced ejection fraction (HFmrEF) (HCC)    a.) TTE 10/23/2015: EF 60-65%, mod-sev LVH, mild biatrial dil, degen MV disease, AoV sclerosis, asc Ao 38 mm, G1DD; b.) TTE 08/05/2022: EF 45-50%, mod-sev LVH (speckled pattern), sev biatrial dil, mild MR, AoV sclerosis, G1DD   Hepatic flexure mass 08/23/2022   a.) colonoscopy 08/23/2022: 14 x 15 mm partially obstructing ulcerated distal ascending colon mass; tissue friable --> pathology resulted as tubular adenoma, however felt to be false (-) --> referral to sugery and colectomy recommended; b.) s/p RIGHT hemicolectomy 09/28/2022   History of MRSA infection 08/28/2022   a.) MRSA PCR (+) 08/28/2022; culture from LEFT thigh abscess   HTN (hypertension)    Hyperlipidemia    Hypogonadism in male    Hypothyroidism    IDA (iron  deficiency  anemia)    LBBB (left bundle branch block)    Long-term use of aspirin  therapy    Lumbar spinal stenosis    Mild cardiomegaly    Multiple lung nodules on CT    NICM (nonischemic cardiomyopathy) (HCC)    a.) TTE 10/23/2015: EF 60-65; b.) TTE 08/05/2022: EF 45-50%   Orchitis and epididymitis 06/16/2013   OSA on CPAP    Palindromic rheumatism, hand    Pericardial effusion    Prostatitis    Sepsis (HCC) 08/2022   Subdural hematoma (HCC) 10/22/2015   a.) s/p traumatic mechanical fall --> CT head 10/22/2015: high-density SDH the left cerebral convexity with13 mm midline shift --> s/p LEFT frontal burr hole craniotomy   Tubular adenoma of colon     Ventral hernia      SURGICAL HISTORY   Past Surgical History:  Procedure Laterality Date   BACK SURGERY     BIOPSY  08/21/2022   Procedure: BIOPSY;  Surgeon: Onita Elspeth Sharper, DO;  Location: [facility] ENDOSCOPY;  Service: Gastroenterology;;   BIOPSY  08/23/2022   Procedure: BIOPSY;  Surgeon: Onita Elspeth Sharper, DO;  Location: [facility] ENDOSCOPY;  Service: Gastroenterology;;   BIOPSY  02/28/2023   Procedure: BIOPSY;  Surgeon: Onita Elspeth Sharper, DO;  Location: [facility] ENDOSCOPY;  Service: Gastroenterology;;   SOLMON DAKIN OF CRANIUM Left 10/22/2015   COLON SURGERY  09/28/2022   COLONOSCOPY WITH PROPOFOL  N/A 08/23/2022   Procedure: COLONOSCOPY WITH PROPOFOL ;  Surgeon: Onita Elspeth Sharper, DO;  Location: [facility] ENDOSCOPY;  Service: Gastroenterology;  Laterality: N/A;   COLONOSCOPY WITH PROPOFOL  N/A 02/28/2023   Procedure: COLONOSCOPY WITH PROPOFOL ;  Surgeon: Onita Elspeth Sharper, DO;  Location: [facility] ENDOSCOPY;  Service: Gastroenterology;  Laterality: N/A;  DM   COLONOSCOPY WITH PROPOFOL  N/A 04/25/2023   Procedure: COLONOSCOPY WITH PROPOFOL ;  Surgeon: Onita Elspeth Sharper, DO;  Location: [facility] ENDOSCOPY;  Service: Gastroenterology;  Laterality: N/A;  DM   ESOPHAGOGASTRODUODENOSCOPY (EGD) WITH PROPOFOL  N/A 08/21/2022   Procedure: ESOPHAGOGASTRODUODENOSCOPY (EGD) WITH PROPOFOL ;  Surgeon: Onita Elspeth Sharper, DO;  Location: [facility] ENDOSCOPY;  Service: Gastroenterology;  Laterality: N/A;   ESOPHAGOGASTRODUODENOSCOPY (EGD) WITH PROPOFOL  N/A 02/28/2023   Procedure: ESOPHAGOGASTRODUODENOSCOPY (EGD) WITH PROPOFOL ;  Surgeon: Onita Elspeth Sharper, DO;  Location: [facility] ENDOSCOPY;  Service: Gastroenterology;  Laterality: N/A;   GIVENS CAPSULE STUDY N/A 08/23/2022   Procedure: GIVENS CAPSULE STUDY;  Surgeon: Onita Elspeth Sharper, DO;  Location: [facility] ENDOSCOPY;  Service: Gastroenterology;  Laterality: N/A;   HERNIA REPAIR     INGUINAL HERNIA REPAIR Right 03/09/2023   Procedure: HERNIA REPAIR INGUINAL  ADULT, open, RNFA to assist;  Surgeon: Jordis Laneta FALCON, MD;  Location: [facility] ORS;  Service: General;  Laterality: Right;   IR BONE MARROW BIOPSY & ASPIRATION  08/19/2023   IR US  GUIDE BX ASP/DRAIN  06/27/2023   IRRIGATION AND DEBRIDEMENT ABSCESS Left 08/28/2022   Procedure: IRRIGATION AND DEBRIDEMENT ABSCESS LEFT UPPER THIGH/GROIN;  Surgeon: Jordis Laneta FALCON, MD;  Location: [facility] ORS;  Service: General;  Laterality: Left;   LAPAROSCOPIC RIGHT COLECTOMY Right 09/28/2022   Procedure: LAPAROSCOPIC RIGHT COLECTOMY, RNFA to assist;  Surgeon: Jordis Laneta FALCON, MD;  Location: [facility] ORS;  Service: General;  Laterality: Right;   POLYPECTOMY  08/23/2022   Procedure: POLYPECTOMY;  Surgeon: Onita Elspeth Sharper, DO;  Location: [facility] ENDOSCOPY;  Service: Gastroenterology;;   POLYPECTOMY  04/25/2023   Procedure: POLYPECTOMY;  Surgeon: Onita Elspeth Sharper, DO;  Location: [facility] ENDOSCOPY;  Service: Gastroenterology;;   SUBMUCOSAL TATTOO INJECTION  08/23/2022   Procedure: SUBMUCOSAL TATTOO  INJECTION;  Surgeon: Onita Elspeth Sharper, DO;  Location: [facility] ENDOSCOPY;  Service: Gastroenterology;;   TONSILLECTOMY     VENTRAL HERNIA REPAIR N/A 09/28/2022   Procedure: HERNIA REPAIR VENTRAL ADULT;  Surgeon: Jordis Laneta FALCON, MD;  Location: [facility] ORS;  Service: General;  Laterality: N/A;     FAMILY HISTORY   Family History  Problem Relation Age of Onset   Cancer Maternal Aunt    ADD / ADHD Daughter    Asthma Son    Prostate cancer Neg Hx    Kidney disease Neg Hx    Kidney cancer Neg Hx    Bladder Cancer Neg Hx      SOCIAL HISTORY   Social History   Tobacco Use   Smoking status: Former    Types: Cigarettes    Passive exposure: Past   Smokeless tobacco: Never   Tobacco comments:    Stop smoking iver 40  plus years ago  Vaping Use   Vaping status: Never Used  Substance Use Topics   Alcohol use: No   Drug use: No     MEDICATIONS    Home Medication:  Current Outpatient Rx   Order #: [id number] Class: Normal     Current Medication:  Current Facility-Administered Medications:    oxyCODONE  (Oxy IR/ROXICODONE ) immediate release tablet 10 mg, 10 mg, Oral, Q8H PRN, 10 mg at 11/24/23 2100 **AND** acetaminophen  (TYLENOL ) tablet 325 mg, 325 mg, Oral, Q8H PRN, Hunt, Madison H, RPH   acetaminophen  (TYLENOL ) tablet 650 mg, 650 mg, Oral, Q4H PRN, Laurita Manor T, MD   aspirin  EC tablet 81 mg, 81 mg, Oral, Daily, Laurita, Ping T, MD, 81 mg at 11/25/23 9182   benzonatate (TESSALON) capsule 200 mg, 200 mg, Oral, TID PRN, Zhang, Ping T, MD   budesonide-glycopyrrolate -formoterol (BREZTRI) 160-9-4.8 MCG/ACT inhaler 2 puff, 2 puff, Inhalation, BID, Laurita Manor T, MD, 2 puff at 11/25/23 9185   chlorpheniramine-HYDROcodone  (TUSSIONEX) 10-8 MG/5ML suspension 5 mL, 5 mL, Oral, Q12H PRN, Von Bellis, MD   doxycycline  (VIBRA -TABS) tablet 100 mg, 100 mg, Oral, Q12H, Aislynn Cifelli, MD, 100 mg at 11/25/23 0816   empagliflozin  (JARDIANCE ) tablet 10 mg, 10 mg, Oral, QAC breakfast, Von Bellis, MD, 10 mg at 11/25/23 0815   enoxaparin  (LOVENOX ) injection 40 mg, 40 mg, Subcutaneous, QHS, Zhang, Ping T, MD, 40 mg at 11/24/23 2100   furosemide  (LASIX ) injection 40 mg, 40 mg, Intravenous, BID, Laurita Manor T, MD, 40 mg at 11/25/23 9181   guaiFENesin  (MUCINEX ) 12 hr tablet 600 mg, 600 mg, Oral, BID, Von Bellis, MD, 600 mg at 11/25/23 9182   hydrALAZINE  (APRESOLINE ) tablet 100 mg, 100 mg, Oral, BID, Zhang, Ping T, MD, 100 mg at 11/25/23 0815   insulin  aspart (novoLOG ) injection 0-15 Units, 0-15 Units, Subcutaneous, TID WC, Laurita Manor T, MD, 11 Units at 11/24/23 1655   ipratropium-albuterol  (DUONEB) 0.5-2.5 (3) MG/3ML nebulizer solution 3 mL, 3 mL, Nebulization, Q6H PRN, Von Bellis, MD   levothyroxine  (SYNTHROID ) tablet 200 mcg, 200 mcg, Oral, Q0600, Zhang, Ping T, MD, 200 mcg at 11/25/23 0511   loratadine (CLARITIN) tablet 10 mg, 10 mg, Oral, Daily, Von Bellis, MD, 10 mg at 11/25/23 9182   metoprolol  succinate (TOPROL -XL) 24  hr tablet 25 mg, 25 mg, Oral, Daily, Von Bellis, MD, 25 mg at 11/25/23 0816   mirtazapine (REMERON) tablet 7.5 mg, 7.5 mg, Oral, QHS, Zhang, Manor T, MD, 7.5 mg at 11/24/23 2100   ondansetron  (ZOFRAN ) injection 4 mg, 4 mg, Intravenous, Q6H PRN, Laurita Manor DASEN, MD   [  START ON 11/26/2023] predniSONE (DELTASONE) tablet 50 mg, 50 mg, Oral, Q breakfast, Alegandro Macnaughton, MD   sacubitril -valsartan  (ENTRESTO ) 24-26 mg per tablet, 1 tablet, Oral, BID, Zhang, Ping T, MD, 1 tablet at 11/25/23 0816   sodium chloride  flush (NS) 0.9 % injection 3 mL, 3 mL, Intravenous, Q12H, Laurita Manor T, MD, 3 mL at 11/25/23 0820   sodium chloride  flush (NS) 0.9 % injection 3 mL, 3 mL, Intravenous, PRN, Zhang, Ping T, MD   spironolactone  (ALDACTONE ) tablet 12.5 mg, 12.5 mg, Oral, Daily, Von Bellis, MD, 12.5 mg at 11/25/23 0815    ALLERGIES   Patient has no known allergies.     REVIEW OF SYSTEMS    Review of Systems:  Gen:  Denies  fever, sweats, chills weigh loss  HEENT: Denies blurred vision, double vision, ear pain, eye pain, hearing loss, nose bleeds, sore throat Cardiac:  No dizziness, chest pain or heaviness, chest tightness,edema Resp:   reports dyspnea chronically  Gi: Denies swallowing difficulty, stomach pain, nausea or vomiting, diarrhea, constipation, bowel incontinence Gu:  Denies bladder incontinence, burning urine Ext:   Denies Joint pain, stiffness or swelling Skin: Denies  skin rash, easy bruising or bleeding or hives Endoc:  Denies polyuria, polydipsia , polyphagia or weight change Psych:   Denies depression, insomnia or hallucinations   Other:  All other systems negative   VS: BP 131/87 (BP Location: Right Arm)   Pulse 65   Temp 98.3 F (36.8 C)   Resp 17   Ht 6' 1 (1.854 m)   Wt 87.7 kg   SpO2 97%   BMI 25.51 kg/m      PHYSICAL EXAM    GENERAL:NAD, no fevers, chills, no weakness no fatigue HEAD: Normocephalic, atraumatic.  EYES: Pupils equal, round, reactive to light.  Extraocular muscles intact. No scleral icterus.  MOUTH: Moist mucosal membrane. Dentition intact. No abscess noted.  EAR, NOSE, THROAT: Clear without exudates. No external lesions.  NECK: Supple. No thyromegaly. No nodules. No JVD.  PULMONARY: decreased breath sounds with mild rhonchi worse at bases bilaterally.  CARDIOVASCULAR: S1 and S2. Regular rate and rhythm. No murmurs, rubs, or gallops. No edema. Pedal pulses 2+ bilaterally.  GASTROINTESTINAL: Soft, nontender, nondistended. No masses. Positive bowel sounds. No hepatosplenomegaly.  MUSCULOSKELETAL: No swelling, clubbing, or edema. Range of motion full in all extremities.  NEUROLOGIC: Cranial nerves II through XII are intact. No gross focal neurological deficits. Sensation intact. Reflexes intact.  SKIN: No ulceration, lesions, rashes, or cyanosis. Skin warm and dry. Turgor intact.  PSYCHIATRIC: Mood, affect within normal limits. The patient is awake, alert and oriented x 3. Insight, judgment intact.       IMAGING     EXAM: CT CHEST WITHOUT CONTRAST 11/09/2023 11:28:35 AM  TECHNIQUE: CT of the chest was performed without the administration of intravenous contrast. Multiplanar reformatted images are provided for review. Automated exposure control, iterative reconstruction, and/or weight based adjustment of the mA/kV was utilized to reduce the radiation dose to as low as reasonably achievable.  COMPARISON: CT angiogram chest 04/27/2023.  CLINICAL HISTORY: Cough that has been going on for a while but worse over last several days with productive component; SOB last several days; hx of leukemia; no surg former smoker quit about 40 years ago.  FINDINGS:  MEDIASTINUM: Heart is moderately enlarged, unchanged. There is a trace pericardial effusion. There are atherosclerotic calcifications of the aorta. The central airways are clear.  LYMPH NODES: There are numerous prominent and nonenlarged axillary lymph nodes. There  are additional enlarged left axillary lymph nodes measuring up to 16 mm which have increased in size and number compared to prior. There is a precarinal enlarged lymph node measuring 15 mm which is new from prior. Enlarged subcarinal lymph node measures 15 mm, new from prior.  LUNGS AND PLEURA: There are trace bilateral pleural effusions. There are scattered ground glass opacities and peripheral interstitial opacities throughout both lungs. There are some patchy ground-glass opacities in the bilateral lower lobes, left greater than right and minimal patchy airspace opacities in the right middle lobe. There is central peribronchial wall thickening bilaterally. Nodular density seen in the right lower lobe measuring 7 mm image 3/91. There are few more additional scattered right lower lobe 3 mm nodular densities. These are new from prior. No pneumothorax.  SOFT TISSUES/BONES: There is diffuse chest wall edema. No acute abnormality of the bones.  UPPER ABDOMEN: Right renal cysts are partially visualized. Limited images of the upper abdomen demonstrates no acute abnormality.  IMPRESSION: 1. Scattered ground glass and peripheral interstitial opacities throughout both lungs, with patchy ground-glass opacities in the bilateral lower lobes (left greater than right) and minimal patchy airspace opacities in the right middle lobe. Findings are favored as infectious/inflammatory, although edema may be superimposed. 2. New and increased size and number of enlarged left axillary lymph nodes, measuring up to 16 mm. 3. New precarinal and subcarinal enlarged lymph nodes, each measuring 15 mm. 4. New 7 mm nodular density in the right lower lobe with additional scattered 3 mm nodular densities. Per Fleischner Society Guidelines recommend a non-contrast chest CT at 3-6 months, then consider another non-contrast chest CT at 18-24 months. If patient is low risk for malignancy, non-contrast chest CT at  18-24 months is optional. 5. Trace bilateral pleural effusions. 6. Moderately enlarged heart with trace pericardial effusion, unchanged from prior.  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   Patient/Family are satisfied with care plan and all questions have been answered.    Provider disclosure: Patient with at least one acute or chronic illness or injury that poses a threat to life or bodily function and is being managed actively during this encounter.  All of the below services have been performed independently by signing provider:  review of prior documentation from internal and or external health records.  Review of previous and current lab results.  Interview and comprehensive assessment during patient visit today. Review of current and previous chest radiographs/CT scans. Discussion of management and test interpretation with health care team and patient/family.   This document was prepared using Dragon voice recognition software and may include unintentional dictation errors.  Nyx Keady, M.D.  Division of Pulmonary & Critical Care Medicine

## 2023-11-25 NOTE — Progress Notes (Signed)
 Heart Failure Stewardship Pharmacy Note  PCP: Cyrus Selinda Moose, PA-C PCP-Cardiologist: Redell Cave, MD  HPI: Paul Sprong [name] is a 78 y.o. 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 On admission, BNP was 1856.6, HS-troponin was 42, and A1c 5.5. Chest x-ray noted mild interstitial edema and bilateral pleural effusions concerning for mild CHF.   Pertinent cardiac history: TTE 07/2022 with LVEF 45-50%. Cardiac CTA 08/2022 showed coronary calcium  score of 14.3, 12th percentile for age and sex matched control, minimal proximal LAD stenosis, overall minimal nonobstructive CAD. cMRI 03/2023 showed EF 43% Mod-sev basal septal hypertrophy (16mm) RVEF 48% No LGE Mild MR, suggestive of non ischemic cardiomyopathy. Possible inflammatory disease involving the basal lateral wall. TTE pending this admission noted LVEF down to 35-40%, moderate LVH, G2DD, mildly reduced RV function, mild AR.   Pertinent Lab Values: Creatinine  Date Value Ref Range Status  10/03/2023 1.21 0.61 - 1.24 mg/dL Final   Creat  Date Value Ref Range Status  07/06/2021 1.08 0.70 - 1.28 mg/dL Final   Creatinine, Ser  Date Value Ref Range Status  11/25/2023 1.10 0.61 - 1.24 mg/dL Final   BUN  Date Value Ref Range Status  11/25/2023 34 (H) 8 - 23 mg/dL Final  88/72/7975 12 8 - 27 mg/dL Final  90/87/7984 27 (H) 7 - 18 mg/dL Final   Potassium  Date Value Ref Range Status  11/25/2023 3.5 3.5 - 5.1 mmol/L Final  10/06/2013 4.1 3.5 - 5.1 mmol/L Final   Sodium  Date Value Ref Range Status  11/25/2023 142 135 - 145 mmol/L Final  12/22/2022 146 (H) 134 - 144 mmol/L Final  10/06/2013 139 136 - 145 mmol/L Final   B Natriuretic Peptide  Date Value Ref Range Status  11/22/2023 1,857.1 (H) 0.0 - 100.0 pg/mL Final    Comment:     Performed at [facility], [address] [place], [place] [address]   Magnesium  Date Value Ref Range Status  11/25/2023 2.2 1.7 - 2.4 mg/dL Final    Comment:    Performed at [facility], [address] [place], [place] [address]   HbA1c POC (<> result, manual entry)  Date Value Ref Range Status  07/06/2021 6.5 4.0 - 5.6 % Final   Hgb A1c MFr Bld  Date Value Ref Range Status  11/21/2023 5.5 4.8 - 5.6 % Final    Comment:    (NOTE) Diagnosis of Diabetes The following HbA1c ranges recommended by the American Diabetes Association (ADA) may be used as an aid in the diagnosis of diabetes mellitus.  Hemoglobin             Suggested A1C NGSP%              Diagnosis  <5.7                   Non Diabetic  5.7-6.4                Pre-Diabetic  >6.4                   Diabetic  <7.0                   Glycemic control for  adults with diabetes.     TSH  Date Value Ref Range Status  06/29/2023 28.297 (H) 0.350 - 4.500 uIU/mL Final    Comment:    Performed by a 3rd Generation assay with a functional sensitivity of <=0.01 uIU/mL. Performed at [facility], [address] [place], [place] [address]   12/22/2022 27.300 (H) 0.450 - 4.500 uIU/mL Final   LDH  Date Value Ref Range Status  11/01/2023 285 (H) 98 - 192 U/L Final    Comment:    Performed at [facility], [address] [place], [place] [address]    Vital Signs:  Temp:  [97.9 F (36.6 C)-98.6 F (37 C)] 98.6 F (37 C) (10/31 0351) Pulse Rate:  [64-75] 65 (10/31 0351) Cardiac Rhythm: Normal sinus rhythm;Heart block;Bundle branch block (10/30 1900) Resp:  [18] 18 (10/31 0351) BP: (102-146)/(49-86) 105/49 (10/31 0351) SpO2:  [93 %-100 %] 97 % (10/31 0351) Weight:  [86.6 kg (190 lb 14.4 oz)-87.7 kg (193 lb 5.5 oz)] 87.7 kg (193 lb 5.5 oz) (10/31 0500)  Intake/Output Summary (Last 24 hours) at 11/25/2023 0724 Last data filed at 11/25/2023 0700 Gross  per 24 hour  Intake 1443 ml  Output 3830 ml  Net -2387 ml    Current Heart Failure Medications:  Loop diuretic: furosemide  40 mg IV BID Beta-Blocker: none ACEI/ARB/ARNI: Entresto  24-26 mg BID MRA: spironolactone  12.5 mg daily SGLT2i: Jardiance  10 mg daily Other: hydralazine  100 mg BID  Prior to admission Heart Failure Medications:  Loop diuretic: furosemide  20 mg prn Beta-Blocker: metoprolol  succinate 25 mg daily (not taking) ACEI/ARB/ARNI: Entresto  97-103 mg BID (not taking) MRA: spironolactone  25 mg daily (not taking) SGLT2i: Jardiance  10 mg daily Other: hydralazine  100 mg BID  Assessment: 1. Acute on chronic systolic heart failure (LVEF 35-40%) with G2DD and mild RV dysfunction, due to NICM. NYHA class III symptoms.  -Symptoms: Reports feeling improved since admission. Still with productive cough. Complains of cyst on buttocks. Orthopnea is resolved. Appetite is fine.  -Volume: Appears to be euvolemic. Creatinine and BUN are slightly up. Consider transitioning to oral furosemide  20 mg daily today -Hemodynamics: BP is elevated. HR 60s. -BB: Currently being held due to hypervolemia. Consider transitioning to bisoprolol 2.5 mg daily given COPD. -ACEI/ARB/ARNI: Entresto  resumed at lower dose of 24-26 mg BID. Can consider increasing tomorrow if BP remains elevated  -MRA: Consider increasing spironolactone  25 mg daily.  K <4. -SGLT2i: Continue Jardiance  10 mg daily  Plan: 1) Medication changes recommended at this time: -Consider increasing spironolactone  to 25 mg daily -Consider transitioning to furosemide  20 mg PO daily  2) Patient assistance: -Pending  3) Education: - Patient has been educated on current HF medications and potential additions to HF medication regimen - Patient verbalizes understanding that over the next few months, these medication doses may change and more medications may be added to optimize HF regimen - Patient has been educated on basic disease state  pathophysiology and goals of therapy  Please do not hesitate to reach out with questions or concerns,  Jaun Bash, PharmD, CPP, BCPS, [facility] Heart Failure Pharmacist  Phone - [phone] 11/25/2023 7:24 AM

## 2023-11-25 NOTE — Plan of Care (Signed)

## 2023-11-25 NOTE — Progress Notes (Signed)
 PROGRESS NOTE   HPI was taken from Dr. Laurita: Paul Nadara Browner [name] is a 78 y.o. male with medical history significant of chronic combined HFrEF and HFpEF, CLL, HTN, HLD, IIDM, hypothyroidism, CKD stage IIIa, BPH, SDH, OSA not on CPAP, chronic pain and narcotic dependence, COPD Gold stage I?  Presented with worsening of cough wheezing shortness of breath.   Symptoms started about 2 weeks ago, patient started to have a productive cough with whitish sputum production, and wheezing and increasing exertional dyspnea.  Was prescribed with 5 days course of Augmentin , however despite taking antibiotics his symptoms did not significantly improved.  Denied any chest pain no fever or chills.  Last few days he also found significant orthopnea.  He does weigh himself and found his weight has been  fluctuating but denied any ankle swelling.   ED Course: Afebrile, blood pressure elevated 160/85 ultrafiltration 99% on room air.  Chest x-ray showed interstitial edema, blood work showed BUN 18 creatinine 1.0 potassium 4.0 glucose 103 bicarb 22, WBC 10.6, hemoglobin 9.9.   Patient was given IV Lasix  60 mg x 1 in the ED.   Paul Bembenek [place] Esperanza [name]  FMW:[id number] DOB: 1945-12-10 DOA: 11/21/2023 PCP: Cyrus Selinda Moose, PA-C   Assessment & Plan:   Principal Problem:   CHF (congestive heart failure) (HCC) Active Problems:   Acute on chronic combined systolic and diastolic CHF (congestive heart failure) (HCC)  Assessment and Plan: Acute on chronic combined CHF exacerbation: continue on IV lasix . Monitor I/Os. Neg approx 2.8L today currently. Continue on entresto , aldactone , hydralazine     COPD exacerbation: completed azithromycin course. Continue on doxy, steroids, bronchodilators as per pulmon. D/c home on doxy x 5 days, steroid taper & breztri as per pulmon. Will need to outpatient w/ Dr. Aleskerov within 1 month. Pulmon signed off 11/25/23  Left buttocks nodule: etiology unclear, possible  abscess. US  of left buttocks ordered. If positive, will need to consult gen surg for possible I&D   HTN: continue on home dose of metoprolol , aldactone , entresto , hydralazine    H/o CLL: management per onco outpatient    Hypothyroidism: continue on levothyroxine    DM2: well controlled, HbA1c 5.5. Continue on SSI w/ accuchecks.  Holding home metformin   Hx of OSA: does not use CPAP at home. CPAP at bedtime prn       DVT prophylaxis: lovenox  Code Status: full  Family Communication: discussed pt's care w/ daughter, [name], and answered her questions  Disposition Plan: likely d/c back home   Level of care: Telemetry Cardiac  Status is: Inpatient Remains inpatient appropriate because: severity of illness    Consultants:  pulmon  Procedures:   Antimicrobials:  doxy   Subjective: Pt c/o left buttocks pain   Objective: Vitals:   11/25/23 0351 11/25/23 0500 11/25/23 0756 11/25/23 1346  BP: (!) 105/49  131/87 124/70  Pulse: 65  65 77  Resp: 18  17 16   Temp: 98.6 F (37 C)  98.3 F (36.8 C) 98.6 F (37 C)  TempSrc:      SpO2: 97%  97% 94%  Weight:  87.7 kg    Height:        Intake/Output Summary (Last 24 hours) at 11/25/2023 1509 Last data filed at 11/25/2023 1232 Gross per 24 hour  Intake 1203 ml  Output 4100 ml  Net -2897 ml   Filed Weights   11/24/23 0500 11/24/23 1208 11/25/23 0500  Weight: 84.5 kg 86.6 kg 87.7 kg    Examination:  General exam: appears calm  Respiratory system: diminished breath sounds b/l Cardiovascular system: S1 &S2+. No rubs or clicks Gastrointestinal system: abd is soft, NT, ND & hypoactive bowel sounds  Central nervous system: alert & oriented. Moves all extremities  Psychiatry: judgement and insight appears normal. Flat mood and affect      Data Reviewed: I have personally reviewed following labs and imaging studies  CBC: Recent Labs  Lab 11/21/23 1235 11/23/23 0517 11/24/23 0515 11/25/23 0227  WBC 10.6* 14.2*  14.7* 17.7*  HGB 9.9* 10.2* 10.5* 11.1*  HCT 32.5* 32.9* 33.9* 34.9*  MCV 91.8 90.4 91.9 89.3  PLT 246 252 243 292   Basic Metabolic Panel: Recent Labs  Lab 11/21/23 1235 11/22/23 0535 11/22/23 0947 11/23/23 0517 11/24/23 0515 11/25/23 0227  NA 138 141  --  143 142 142  K 4.0 3.8  --  3.6 3.7 3.5  CL 107 109  --  107 108 106  CO2 22 25  --  25 25 26   GLUCOSE 103* 116*  --  126* 115* 120*  BUN 18 18  --  22 25* 34*  CREATININE 1.00 0.94  --  0.99 1.11 1.10  CALCIUM  8.7* 8.1*  --  8.3* 8.3* 8.6*  MG  --   --  2.2 2.3 2.3 2.2  PHOS  --   --  2.5 2.2*  --   --    GFR: Estimated Creatinine Clearance: 63.6 mL/min (by C-G formula based on SCr of 1.1 mg/dL). Liver Function Tests: No results for input(s): AST, ALT, ALKPHOS, BILITOT, PROT, ALBUMIN in the last 168 hours. No results for input(s): LIPASE, AMYLASE in the last 168 hours. No results for input(s): AMMONIA in the last 168 hours. Coagulation Profile: No results for input(s): INR, PROTIME in the last 168 hours. Cardiac Enzymes: Recent Labs  Lab 11/23/23 0517  CKTOTAL 151   BNP (last 3 results) No results for input(s): PROBNP in the last 8760 hours. HbA1C: No results for input(s): HGBA1C in the last 72 hours.  CBG: Recent Labs  Lab 11/24/23 1633 11/24/23 2048 11/24/23 2227 11/25/23 0755 11/25/23 1157  GLUCAP 313* 70 105* 118* 268*   Lipid Profile: Recent Labs    11/23/23 0517  CHOL 204*  HDL 49  LDLCALC 139*  TRIG 81  CHOLHDL 4.2   Thyroid  Function Tests: No results for input(s): TSH, T4TOTAL, FREET4, T3FREE, THYROIDAB in the last 72 hours. Anemia Panel: No results for input(s): VITAMINB12, FOLATE, FERRITIN, TIBC, IRON , RETICCTPCT in the last 72 hours. Sepsis Labs: Recent Labs  Lab 11/22/23 0947  PROCALCITON <0.10    Recent Results (from the past 240 hours)  Resp panel by RT-PCR (RSV, Flu A&B, Covid) Anterior Nasal Swab     Status: None    Collection Time: 11/21/23 12:35 PM   Specimen: Anterior Nasal Swab  Result Value Ref Range Status   SARS Coronavirus 2 by RT PCR NEGATIVE NEGATIVE Final    Comment: (NOTE) SARS-CoV-2 target nucleic acids are NOT DETECTED.  The SARS-CoV-2 RNA is generally detectable in upper respiratory specimens during the acute phase of infection. The lowest concentration of SARS-CoV-2 viral copies this assay can detect is 138 copies/mL. A negative result does not preclude SARS-Cov-2 infection and should not be used as the sole basis for treatment or other patient management decisions. A negative result may occur with  improper specimen collection/handling, submission of specimen other than nasopharyngeal swab, presence of viral mutation(s) within the areas targeted by this assay, and inadequate number of viral copies(<138 copies/mL).  A negative result must be combined with clinical observations, patient history, and epidemiological information. The expected result is Negative.  Fact Sheet for Patients:  bloggercourse.com  Fact Sheet for Healthcare Providers:  seriousbroker.it  This test is no t yet approved or cleared by the [place]  FDA and  has been authorized for detection and/or diagnosis of SARS-CoV-2 by FDA under an Emergency Use Authorization (EUA). This EUA will remain  in effect (meaning this test can be used) for the duration of the COVID-19 declaration under Section 564(b)(1) of the Act, 21 U.S.C.section 360bbb-3(b)(1), unless the authorization is terminated  or revoked sooner.       Influenza A by PCR NEGATIVE NEGATIVE Final   Influenza B by PCR NEGATIVE NEGATIVE Final    Comment: (NOTE) The Xpert Xpress SARS-CoV-2/FLU/RSV plus assay is intended as an aid in the diagnosis of influenza from Nasopharyngeal swab specimens and should not be used as a sole basis for treatment. Nasal washings and aspirates are unacceptable for  Xpert Xpress SARS-CoV-2/FLU/RSV testing.  Fact Sheet for Patients: bloggercourse.com  Fact Sheet for Healthcare Providers: seriousbroker.it  This test is not yet approved or cleared by the [place]  FDA and has been authorized for detection and/or diagnosis of SARS-CoV-2 by FDA under an Emergency Use Authorization (EUA). This EUA will remain in effect (meaning this test can be used) for the duration of the COVID-19 declaration under Section 564(b)(1) of the Act, 21 U.S.C. section 360bbb-3(b)(1), unless the authorization is terminated or revoked.     Resp Syncytial Virus by PCR NEGATIVE NEGATIVE Final    Comment: (NOTE) Fact Sheet for Patients: bloggercourse.com  Fact Sheet for Healthcare Providers: seriousbroker.it  This test is not yet approved or cleared by the [place]  FDA and has been authorized for detection and/or diagnosis of SARS-CoV-2 by FDA under an Emergency Use Authorization (EUA). This EUA will remain in effect (meaning this test can be used) for the duration of the COVID-19 declaration under Section 564(b)(1) of the Act, 21 U.S.C. section 360bbb-3(b)(1), unless the authorization is terminated or revoked.  Performed at [facility], [address] [place], [place] [address]   MRSA Next Gen by PCR, Nasal     Status: Abnormal   Collection Time: 11/23/23  8:15 AM   Specimen: Nasal Mucosa; Nasal Swab  Result Value Ref Range Status   MRSA by PCR Next Gen DETECTED (A) NOT DETECTED Final    Comment: RESULT CALLED TO, READ BACK BY AND VERIFIED WITH: MARINDA ADAM, RN 1020 11/23/23 GM (NOTE) The GeneXpert MRSA Assay (FDA approved for NASAL specimens only), is one component of a comprehensive MRSA colonization surveillance program. It is not intended to diagnose MRSA infection nor to guide or monitor treatment for MRSA infections. Test performance  is not FDA approved in patients less than 48 years old. Performed at [facility], [address] [place], [place] [address]   Respiratory (~20 pathogens) panel by PCR     Status: None   Collection Time: 11/23/23  8:15 AM   Specimen: Sputum; Respiratory  Result Value Ref Range Status   Adenovirus NOT DETECTED NOT DETECTED Final   Coronavirus 229E NOT DETECTED NOT DETECTED Final    Comment: (NOTE) The Coronavirus on the Respiratory Panel, DOES NOT test for the novel  Coronavirus (2019 nCoV)    Coronavirus HKU1 NOT DETECTED NOT DETECTED Final   Coronavirus NL63 NOT DETECTED NOT DETECTED Final   Coronavirus OC43 NOT DETECTED NOT DETECTED Final   Metapneumovirus NOT  DETECTED NOT DETECTED Final   Rhinovirus / Enterovirus NOT DETECTED NOT DETECTED Final   Influenza A NOT DETECTED NOT DETECTED Final   Influenza B NOT DETECTED NOT DETECTED Final   Parainfluenza Virus 1 NOT DETECTED NOT DETECTED Final   Parainfluenza Virus 2 NOT DETECTED NOT DETECTED Final   Parainfluenza Virus 3 NOT DETECTED NOT DETECTED Final   Parainfluenza Virus 4 NOT DETECTED NOT DETECTED Final   Respiratory Syncytial Virus NOT DETECTED NOT DETECTED Final   Bordetella pertussis NOT DETECTED NOT DETECTED Final   Bordetella Parapertussis NOT DETECTED NOT DETECTED Final   Chlamydophila pneumoniae NOT DETECTED NOT DETECTED Final   Mycoplasma pneumoniae NOT DETECTED NOT DETECTED Final    Comment: Performed at [facility], [address], [place], [place] [address]         Radiology Studies: No results found.       Scheduled Meds:  aspirin  EC  81 mg Oral Daily   budesonide-glycopyrrolate -formoterol  2 puff Inhalation BID   doxycycline   100 mg Oral Q12H   empagliflozin   10 mg Oral QAC breakfast   enoxaparin  (LOVENOX ) injection  40 mg Subcutaneous QHS   furosemide   40 mg Intravenous BID   guaiFENesin   600 mg Oral BID   hydrALAZINE   100 mg Oral BID   insulin  aspart  0-15 Units Subcutaneous  TID WC   levothyroxine   200 mcg Oral Q0600   loratadine  10 mg Oral Daily   metoprolol  succinate  25 mg Oral Daily   mirtazapine  7.5 mg Oral QHS   [START ON 11/26/2023] predniSONE  50 mg Oral Q breakfast   Followed by   NOREEN ON 11/27/2023] predniSONE  45 mg Oral Q breakfast   Followed by   NOREEN ON 11/28/2023] predniSONE  40 mg Oral Q breakfast   Followed by   NOREEN ON 11/29/2023] predniSONE  35 mg Oral Q breakfast   Followed by   NOREEN ON 11/30/2023] predniSONE  30 mg Oral Q breakfast   Followed by   NOREEN ON 12/01/2023] predniSONE  25 mg Oral Q breakfast   Followed by   NOREEN ON 12/02/2023] predniSONE  20 mg Oral Q breakfast   Followed by   NOREEN ON 12/03/2023] predniSONE  15 mg Oral Q breakfast   Followed by   NOREEN ON 12/04/2023] predniSONE  10 mg Oral Q breakfast   Followed by   NOREEN ON 12/05/2023] predniSONE  5 mg Oral Q breakfast   sacubitril -valsartan   1 tablet Oral BID   sodium chloride  flush  3 mL Intravenous Q12H   spironolactone   12.5 mg Oral Daily   Continuous Infusions:   LOS: 3 days       Anthony CHRISTELLA Pouch, MD [facility] Pager [phone]  If 7PM-7AM, please contact night-coverage [email or web address] 11/25/2023, 3:09 PM

## 2023-11-26 DIAGNOSIS — L0231 Cutaneous abscess of buttock: Secondary | ICD-10-CM | POA: Diagnosis not present

## 2023-11-26 DIAGNOSIS — L03317 Cellulitis of buttock: Secondary | ICD-10-CM

## 2023-11-26 DIAGNOSIS — I5043 Acute on chronic combined systolic (congestive) and diastolic (congestive) heart failure: Secondary | ICD-10-CM | POA: Diagnosis not present

## 2023-11-26 LAB — CBC
HCT: 33.4 % — ABNORMAL LOW (ref 39.0–52.0)
Hemoglobin: 10.4 g/dL — ABNORMAL LOW (ref 13.0–17.0)
MCH: 27.8 pg (ref 26.0–34.0)
MCHC: 31.1 g/dL (ref 30.0–36.0)
MCV: 89.3 fL (ref 80.0–100.0)
Platelets: 272 K/uL (ref 150–400)
RBC: 3.74 MIL/uL — ABNORMAL LOW (ref 4.22–5.81)
RDW: 18.6 % — ABNORMAL HIGH (ref 11.5–15.5)
WBC: 16.3 K/uL — ABNORMAL HIGH (ref 4.0–10.5)
nRBC: 0 % (ref 0.0–0.2)

## 2023-11-26 LAB — BASIC METABOLIC PANEL WITH GFR
Anion gap: 10 (ref 5–15)
BUN: 39 mg/dL — ABNORMAL HIGH (ref 8–23)
CO2: 27 mmol/L (ref 22–32)
Calcium: 8.3 mg/dL — ABNORMAL LOW (ref 8.9–10.3)
Chloride: 105 mmol/L (ref 98–111)
Creatinine, Ser: 1.15 mg/dL (ref 0.61–1.24)
GFR, Estimated: >60 mL/min (ref >60)
Glucose, Bld: 123 mg/dL — ABNORMAL HIGH (ref 70–99)
Potassium: 3.7 mmol/L (ref 3.5–5.1)
Sodium: 142 mmol/L (ref 135–145)

## 2023-11-26 LAB — C-REACTIVE PROTEIN: CRP: 0.7 mg/dL (ref <1.0)

## 2023-11-26 LAB — GLUCOSE, CAPILLARY
Glucose-Capillary: 116 mg/dL — ABNORMAL HIGH (ref 70–99)
Glucose-Capillary: 212 mg/dL — ABNORMAL HIGH (ref 70–99)
Glucose-Capillary: 242 mg/dL — ABNORMAL HIGH (ref 70–99)

## 2023-11-26 LAB — MAGNESIUM: Magnesium: 2.2 mg/dL (ref 1.7–2.4)

## 2023-11-26 MED ORDER — FUROSEMIDE 40 MG PO TABS
40.0000 mg | ORAL_TABLET | Freq: Every day | ORAL | Status: DC
  Administered 2023-11-27 – 2023-11-29 (×3): 40 mg via ORAL
  Filled 2023-11-26 (×3): qty 1

## 2023-11-26 MED ORDER — VANCOMYCIN HCL 2000 MG/400ML IV SOLN
2000.0000 mg | Freq: Once | INTRAVENOUS | Status: AC
  Administered 2023-11-26: 2000 mg via INTRAVENOUS
  Filled 2023-11-26 (×2): qty 400

## 2023-11-26 MED ORDER — VANCOMYCIN HCL 1750 MG/350ML IV SOLN
1750.0000 mg | INTRAVENOUS | Status: DC
  Filled 2023-11-26: qty 350

## 2023-11-26 MED ORDER — PIPERACILLIN-TAZOBACTAM 3.375 G IVPB
3.3750 g | Freq: Once | INTRAVENOUS | Status: AC
  Administered 2023-11-26: 3.375 g via INTRAVENOUS
  Filled 2023-11-26: qty 50

## 2023-11-26 MED ORDER — PIPERACILLIN-TAZOBACTAM 3.375 G IVPB
3.3750 g | Freq: Three times a day (TID) | INTRAVENOUS | Status: DC
  Administered 2023-11-26 – 2023-11-27 (×2): 3.375 g via INTRAVENOUS
  Filled 2023-11-26 (×2): qty 50

## 2023-11-26 NOTE — Consult Note (Signed)
 Patient ID: Rome Nadara Browner [name], male   DOB: August 19, 1945, 78 y.o.   MRN: [id number] CC: Left Gluteal Abscess History of Present Illness Lamont Tant [name] is a 78 y.o. male with a myriad of health problems who presents in consultation for.  Gluteal cellulitis.  The patient reports that over the last several days he has had a pain in his buttocks.  He denies any drainage from the area.  He denies any fevers or chills.  He was recently treated with Augmentin  for cough and shortness of breath.  Past Medical History Past Medical History:  Diagnosis Date   Adenoma of left adrenal gland    Anemia    Aortic atherosclerosis    Blood transfusion without reported diagnosis    BPH (benign prostatic hyperplasia)    Bradycardia    CAD (coronary artery disease) 08/26/2022   a.) cCTA 08/26/2022: Ca2+ 14.3 (12th %'ile for age/sex.race match control); (<25%) pLAD   Cellulitis of left thigh 08/2022   Chronic pain syndrome    a.) on COT managed by pain management   Chronic, continuous use of opioids    a.) chronic pain syndrome/chronic back pain; managed by pain management   CKD (chronic kidney disease) stage 3, GFR 30-59 ml/min (HCC)    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   a.) Rai stage III   DDD (degenerative disc disease), cervical    Difficult airway 10/22/2015   a.) 1st attempt with glidescope  --> macroglossic (unsuccessful); b.) 2nd attempt with Mac 4 and direct laryngoscopy with a 8.0 ETT --> tube too large to pass through the cords; b.) 3rd attempt (successful) with a Mac 4 and a 7.5 tube   Diverticulosis    DM (diabetes mellitus), type 2 (HCC)    Dysplasia of prostate    Erectile dysfunction    Frequent falls    Heart failure with mildly reduced ejection fraction (HFmrEF) (HCC)    a.) TTE 10/23/2015: EF 60-65%, mod-sev LVH, mild biatrial dil, degen MV disease, AoV sclerosis, asc Ao 38 mm, G1DD; b.) TTE 08/05/2022: EF 45-50%, mod-sev LVH (speckled pattern), sev  biatrial dil, mild MR, AoV sclerosis, G1DD   Hepatic flexure mass 08/23/2022   a.) colonoscopy 08/23/2022: 14 x 15 mm partially obstructing ulcerated distal ascending colon mass; tissue friable --> pathology resulted as tubular adenoma, however felt to be false (-) --> referral to sugery and colectomy recommended; b.) s/p RIGHT hemicolectomy 09/28/2022   History of MRSA infection 08/28/2022   a.) MRSA PCR (+) 08/28/2022; culture from LEFT thigh abscess   HTN (hypertension)    Hyperlipidemia    Hypogonadism in male    Hypothyroidism    IDA (iron  deficiency anemia)    LBBB (left bundle branch block)    Long-term use of aspirin  therapy    Lumbar spinal stenosis    Mild cardiomegaly    Multiple lung nodules on CT    NICM (nonischemic cardiomyopathy) (HCC)    a.) TTE 10/23/2015: EF 60-65; b.) TTE 08/05/2022: EF 45-50%   Orchitis and epididymitis 06/16/2013   OSA on CPAP    Palindromic rheumatism, hand    Pericardial effusion    Prostatitis    Sepsis (HCC) 08/2022   Subdural hematoma (HCC) 10/22/2015   a.) s/p traumatic mechanical fall --> CT head 10/22/2015: high-density SDH the left cerebral convexity with13 mm midline shift --> s/p LEFT frontal burr hole craniotomy   Tubular adenoma of colon    Ventral hernia  Past Surgical History:  Procedure Laterality Date   BACK SURGERY     BIOPSY  08/21/2022   Procedure: BIOPSY;  Surgeon: Onita Elspeth Sharper, DO;  Location: [facility] ENDOSCOPY;  Service: Gastroenterology;;   BIOPSY  08/23/2022   Procedure: BIOPSY;  Surgeon: Onita Elspeth Sharper, DO;  Location: [facility] ENDOSCOPY;  Service: Gastroenterology;;   BIOPSY  02/28/2023   Procedure: BIOPSY;  Surgeon: Onita Elspeth Sharper, DO;  Location: [facility] ENDOSCOPY;  Service: Gastroenterology;;   SOLMON DAKIN OF CRANIUM Left 10/22/2015   COLON SURGERY  09/28/2022   COLONOSCOPY WITH PROPOFOL  N/A 08/23/2022   Procedure: COLONOSCOPY WITH PROPOFOL ;  Surgeon: Onita Elspeth Sharper, DO;  Location: [facility]  ENDOSCOPY;  Service: Gastroenterology;  Laterality: N/A;   COLONOSCOPY WITH PROPOFOL  N/A 02/28/2023   Procedure: COLONOSCOPY WITH PROPOFOL ;  Surgeon: Onita Elspeth Sharper, DO;  Location: [facility] ENDOSCOPY;  Service: Gastroenterology;  Laterality: N/A;  DM   COLONOSCOPY WITH PROPOFOL  N/A 04/25/2023   Procedure: COLONOSCOPY WITH PROPOFOL ;  Surgeon: Onita Elspeth Sharper, DO;  Location: [facility] ENDOSCOPY;  Service: Gastroenterology;  Laterality: N/A;  DM   ESOPHAGOGASTRODUODENOSCOPY (EGD) WITH PROPOFOL  N/A 08/21/2022   Procedure: ESOPHAGOGASTRODUODENOSCOPY (EGD) WITH PROPOFOL ;  Surgeon: Onita Elspeth Sharper, DO;  Location: [facility] ENDOSCOPY;  Service: Gastroenterology;  Laterality: N/A;   ESOPHAGOGASTRODUODENOSCOPY (EGD) WITH PROPOFOL  N/A 02/28/2023   Procedure: ESOPHAGOGASTRODUODENOSCOPY (EGD) WITH PROPOFOL ;  Surgeon: Onita Elspeth Sharper, DO;  Location: [facility] ENDOSCOPY;  Service: Gastroenterology;  Laterality: N/A;   GIVENS CAPSULE STUDY N/A 08/23/2022   Procedure: GIVENS CAPSULE STUDY;  Surgeon: Onita Elspeth Sharper, DO;  Location: [facility] ENDOSCOPY;  Service: Gastroenterology;  Laterality: N/A;   HERNIA REPAIR     INGUINAL HERNIA REPAIR Right 03/09/2023   Procedure: HERNIA REPAIR INGUINAL ADULT, open, RNFA to assist;  Surgeon: Jordis Laneta FALCON, MD;  Location: [facility] ORS;  Service: General;  Laterality: Right;   IR BONE MARROW BIOPSY & ASPIRATION  08/19/2023   IR US  GUIDE BX ASP/DRAIN  06/27/2023   IRRIGATION AND DEBRIDEMENT ABSCESS Left 08/28/2022   Procedure: IRRIGATION AND DEBRIDEMENT ABSCESS LEFT UPPER THIGH/GROIN;  Surgeon: Jordis Laneta FALCON, MD;  Location: [facility] ORS;  Service: General;  Laterality: Left;   LAPAROSCOPIC RIGHT COLECTOMY Right 09/28/2022   Procedure: LAPAROSCOPIC RIGHT COLECTOMY, RNFA to assist;  Surgeon: Jordis Laneta FALCON, MD;  Location: [facility] ORS;  Service: General;  Laterality: Right;   POLYPECTOMY  08/23/2022   Procedure: POLYPECTOMY;  Surgeon: Onita Elspeth Sharper, DO;  Location: [facility]  ENDOSCOPY;  Service: Gastroenterology;;   POLYPECTOMY  04/25/2023   Procedure: POLYPECTOMY;  Surgeon: Onita Elspeth Sharper, DO;  Location: [facility] ENDOSCOPY;  Service: Gastroenterology;;   SUBMUCOSAL TATTOO INJECTION  08/23/2022   Procedure: SUBMUCOSAL TATTOO INJECTION;  Surgeon: Onita Elspeth Sharper, DO;  Location: [facility] ENDOSCOPY;  Service: Gastroenterology;;   TONSILLECTOMY     VENTRAL HERNIA REPAIR N/A 09/28/2022   Procedure: HERNIA REPAIR VENTRAL ADULT;  Surgeon: Jordis Laneta FALCON, MD;  Location: [facility] ORS;  Service: General;  Laterality: N/A;    No Known Allergies  Current Facility-Administered Medications  Medication Dose Route Frequency Provider Last Rate Last Admin   oxyCODONE  (Oxy IR/ROXICODONE ) immediate release tablet 10 mg  10 mg Oral Q8H PRN Perley Lum DEL, RPH   10 mg at 11/26/23 9291   And   acetaminophen  (TYLENOL ) tablet 325 mg  325 mg Oral Q8H PRN Perley Lum DEL, RPH       acetaminophen  (TYLENOL ) tablet 650 mg  650 mg Oral Q4H PRN Laurita Cort DASEN, MD   650 mg at  11/26/23 0609   aspirin  EC tablet 81 mg  81 mg Oral Daily Laurita Manor T, MD   81 mg at 11/26/23 0941   benzonatate (TESSALON) capsule 200 mg  200 mg Oral TID PRN Laurita Manor DASEN, MD       budesonide-glycopyrrolate -formoterol (BREZTRI) 160-9-4.8 MCG/ACT inhaler 2 puff  2 puff Inhalation BID Laurita Manor T, MD   2 puff at 11/26/23 0944   chlorpheniramine-HYDROcodone  (TUSSIONEX) 10-8 MG/5ML suspension 5 mL  5 mL Oral Q12H PRN Von Bellis, MD   5 mL at 11/25/23 1032   empagliflozin  (JARDIANCE ) tablet 10 mg  10 mg Oral QAC breakfast Von Bellis, MD   10 mg at 11/26/23 0900   enoxaparin  (LOVENOX ) injection 40 mg  40 mg Subcutaneous QHS Laurita Manor T, MD   40 mg at 11/25/23 2033   [START ON 11/27/2023] furosemide  (LASIX ) tablet 40 mg  40 mg Oral Daily Jens Durand, MD       guaiFENesin  (MUCINEX ) 12 hr tablet 600 mg  600 mg Oral BID Von Bellis, MD   600 mg at 11/26/23 0941   hydrALAZINE  (APRESOLINE ) tablet 100 mg  100 mg  Oral BID Laurita Manor T, MD   100 mg at 11/26/23 9057   insulin  aspart (novoLOG ) injection 0-15 Units  0-15 Units Subcutaneous TID WC Zhang, Ping T, MD   5 Units at 11/26/23 1323   ipratropium-albuterol  (DUONEB) 0.5-2.5 (3) MG/3ML nebulizer solution 3 mL  3 mL Nebulization Q6H PRN Von Bellis, MD       levothyroxine  (SYNTHROID ) tablet 200 mcg  200 mcg Oral Q0600 Laurita Manor T, MD   200 mcg at 11/26/23 0609   loratadine (CLARITIN) tablet 10 mg  10 mg Oral Daily Von Bellis, MD   10 mg at 11/26/23 9057   metoprolol  succinate (TOPROL -XL) 24 hr tablet 25 mg  25 mg Oral Daily Von Bellis, MD   25 mg at 11/26/23 0942   mirtazapine (REMERON) tablet 7.5 mg  7.5 mg Oral QHS Zhang, Ping T, MD   7.5 mg at 11/25/23 2036   ondansetron  (ZOFRAN ) injection 4 mg  4 mg Intravenous Q6H PRN Laurita Manor DASEN, MD       piperacillin -tazobactam (ZOSYN ) IVPB 3.375 g  3.375 g Intravenous Q8H Tobie Myron M, [facility]       [START ON 11/27/2023] predniSONE (DELTASONE) tablet 45 mg  45 mg Oral Q breakfast Merrill, Kristin A, RPH       Followed by   NOREEN ON 11/28/2023] predniSONE (DELTASONE) tablet 40 mg  40 mg Oral Q breakfast Merrill, Kristin A, RPH       Followed by   NOREEN ON 11/29/2023] predniSONE (DELTASONE) tablet 35 mg  35 mg Oral Q breakfast Merrill, Kristin A, RPH       Followed by   NOREEN ON 11/30/2023] predniSONE (DELTASONE) tablet 30 mg  30 mg Oral Q breakfast Merrill, Kristin A, RPH       Followed by   NOREEN ON 12/01/2023] predniSONE (DELTASONE) tablet 25 mg  25 mg Oral Q breakfast Merrill, Kristin A, RPH       Followed by   NOREEN ON 12/02/2023] predniSONE (DELTASONE) tablet 20 mg  20 mg Oral Q breakfast Merrill, Kristin A, RPH       Followed by   NOREEN ON 12/03/2023] predniSONE (DELTASONE) tablet 15 mg  15 mg Oral Q breakfast Merrill, Kristin A, RPH       Followed by   NOREEN ON 12/04/2023] predniSONE (DELTASONE) tablet 10  mg  10 mg Oral Q breakfast Suzann Allean LABOR, RPH       Followed by   NOREEN ON  12/05/2023] predniSONE (DELTASONE) tablet 5 mg  5 mg Oral Q breakfast Merrill, Kristin A, RPH       sacubitril -valsartan  (ENTRESTO ) 24-26 mg per tablet  1 tablet Oral BID Laurita Manor T, MD   1 tablet at 11/26/23 0941   sodium chloride  flush (NS) 0.9 % injection 3 mL  3 mL Intravenous Q12H Laurita Manor T, MD   3 mL at 11/26/23 1009   sodium chloride  flush (NS) 0.9 % injection 3 mL  3 mL Intravenous PRN Laurita Manor DASEN, MD       spironolactone  (ALDACTONE ) tablet 12.5 mg  12.5 mg Oral Daily Von Bellis, MD   12.5 mg at 11/26/23 0943   [START ON 11/27/2023] vancomycin  (VANCOREADY) IVPB 1750 mg/350 mL  1,750 mg Intravenous Q24H Tobie Ransom HERO, RPH        Family History Family History  Problem Relation Age of Onset   Cancer Maternal Aunt    ADD / ADHD Daughter    Asthma Son    Prostate cancer Neg Hx    Kidney disease Neg Hx    Kidney cancer Neg Hx    Bladder Cancer Neg Hx        Social History Social History   Tobacco Use   Smoking status: Former    Types: Cigarettes    Passive exposure: Past   Smokeless tobacco: Never   Tobacco comments:    Stop smoking iver 40  plus years ago  Vaping Use   Vaping status: Never Used  Substance Use Topics   Alcohol use: No   Drug use: No        ROS Full ROS of systems performed and is otherwise negative there than what is stated in the HPI  Physical Exam Blood pressure 116/63, pulse 66, temperature 98.6 F (37 C), temperature source Oral, resp. rate 20, height 6' 1 (1.854 m), weight 88.6 kg, SpO2 97%.  Left buttocks there is an area of swelling with some cellulitis around this.  There is some thinning of the skin.  There is indurated tissue but I do not feel an area of fluctuance.  Data Reviewed Reviewed ultrasound and there is cellulitic changes in the skin with a small fluid collection.  I have personally reviewed the patient's imaging and medical records.    Assessment/PLan    Patient with left buttock cellulitis.  On ultrasound  there is a very small fluid collection but I do not feel any fluctuance on exam.  This will likely come to ahead over the next day or 2 and in that time we can perform an incision and drainage.  For now recommend continue antibiotics.  He can use warm compresses to the area to help with pain and it may start to drain spontaneously on its own.    Jayson MALVA Endow 11/26/2023, 8:49 PM

## 2023-11-26 NOTE — Progress Notes (Signed)
 Pharmacy Antibiotic Note  Paul Briggs. is a 78 y.o. male admitted on 11/21/2023 with cellulitis/abscess. 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   11/1: Patient presenting with left buttock cellulitis. Also, ultrasound of pelvis was done, which reflects cellulitis with small subcutaneous abscess. Previously, failed outpatient Augmentin  course. Also, recently completed short course of Azithromycin and Doxycyline earlier this admission for COPD exacerbation. Renal function is currently stable. Will continue to monitor.   Plan: - Will give Vancomycin  2 g IV loading dose x 1 - Will start Vancomycin  maintenance dose at 1750 q24h (eAUC 500.1, Cmin 11.2, Scr 1.15, IBW used, Vd 0.72 L/kg) - Goal AUC 400-550 - Will start Zosyn  3.375 IV q8h  - Will continue to monitor renal function and signs of clinical improvement   Height: 6' 1 (185.4 cm) Weight: 88.6 kg (195 lb 5.2 oz) IBW/kg (Calculated) : 79.9  Temp (24hrs), Avg:98.2 F (36.8 C), Min:97.9 F (36.6 C), Max:98.6 F (37 C)  Recent Labs  Lab 11/21/23 1235 11/22/23 0535 11/23/23 0517 11/24/23 0515 11/25/23 0227 11/26/23 0548  WBC 10.6*  --  14.2* 14.7* 17.7* 16.3*  CREATININE 1.00 0.94 0.99 1.11 1.10 1.15    Estimated Creatinine Clearance: 60.8 mL/min (by C-G formula based on SCr of 1.15 mg/dL).    No Known Allergies  Antimicrobials this admission: Azithromycin 10/28 >> 10/30 Doxycycline  10/29 >> 11/1 Zosyn  11/1 >> Vancomycin  11/1 >>  Dose adjustments this admission:   Microbiology results: 10/30 Expectorated Sputum Assessment: sent   10/29 Resp Panel: negative 10/29 MRSA PCR: positive   Thank you for allowing pharmacy to be a part of this patient's care.   Ransom Blanch PGY-1 Pharmacy Resident  [place] [facility] [place]  11/26/2023 12:45 PM

## 2023-11-26 NOTE — Progress Notes (Addendum)
 Progress Note    Paul Bartling [name]  FMW:[id number] DOB: 12-18-1945  DOA: 11/21/2023 PCP: Cyrus Selinda Moose, PA-C      Brief Narrative:    Medical records reviewed and are as summarized below:  Paul Nadara Browner [name] is a 78 y.o. male  with medical history significant of chronic combined HFrEF and HFpEF, CLL, HTN, HLD, IIDM, hypothyroidism, CKD stage IIIa, BPH, SDH, OSA not on CPAP, chronic pain and narcotic dependence, COPD Gold stage I?,  Who presented to the hospital with cough, wheezing and shortness of breath.  Symptoms started about 2 weeks prior to admission.  He was prescribed a course of Augmentin  but symptoms did not improve.  He developed orthopnea and noticed that he weight had been fluctuating.  Vitals in the ED: Temperature 97.9 F, respiratory 25, pulse 69, BP 149/85, O2 sat 100% on room air.   Chest x-ray showed interstitial edema     Assessment/Plan:   Principal Problem:   CHF (congestive heart failure) (HCC) Active Problems:   Acute on chronic combined systolic and diastolic CHF (congestive heart failure) (HCC)    Body mass index is 25.77 kg/m.    Acute on chronic combined diastolic and systolic CHF: Discontinue IV Lasix . Start oral Lasix  tomorrow.  Continue empagliflozin , Toprol -XL, spironolactone  and Entresto  monitor BMP, daily weight and urine output. Weight down from 210 pounds to 195 pounds. Net fluid loss over 14.5 L. 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    COPD exacerbation: Completed course of azithromycin.  He was on doxycycline .  Discontinue doxycycline . Continue prednisone taper   Leukocytosis: Steroids may be contributing to leukocytosis.  It could also be from left gluteal cellulitis/abscess. Left gluteal cellulitis  and abscess: Started Zosyn  and vancomycin . Consulted Dr. Marinda, general surgeon, to assist with management.   Comorbidities include hypertension, hypothyroidism, well-controlled type II DM, OSA on CPAP at night, history of CLL    Diet Order             Diet regular Fluid consistency: Thin  Diet effective now                                  Consultants: General Surgeon Pulmonologist  Procedures: None    Medications:    aspirin  EC  81 mg Oral Daily   budesonide-glycopyrrolate -formoterol  2 puff Inhalation BID   doxycycline   100 mg Oral Q12H   empagliflozin   10 mg Oral QAC breakfast   enoxaparin  (LOVENOX ) injection  40 mg Subcutaneous QHS   furosemide   40 mg Intravenous BID   guaiFENesin   600 mg Oral BID   hydrALAZINE   100 mg Oral BID   insulin  aspart  0-15 Units Subcutaneous TID WC   levothyroxine   200 mcg Oral Q0600   loratadine  10 mg Oral Daily   metoprolol  succinate  25 mg Oral Daily   mirtazapine  7.5 mg Oral QHS   [START ON 11/27/2023] predniSONE  45 mg Oral Q breakfast   Followed by   NOREEN ON 11/28/2023] predniSONE  40 mg Oral Q breakfast   Followed by   NOREEN ON 11/29/2023] predniSONE  35 mg Oral Q breakfast   Followed by   NOREEN ON 11/30/2023] predniSONE  30 mg  Oral Q breakfast   Followed by   NOREEN ON 12/01/2023] predniSONE  25 mg Oral Q breakfast   Followed by   NOREEN ON 12/02/2023] predniSONE  20 mg Oral Q breakfast   Followed by   NOREEN ON 12/03/2023] predniSONE  15 mg Oral Q breakfast   Followed by   NOREEN ON 12/04/2023] predniSONE  10 mg Oral Q breakfast   Followed by   NOREEN ON 12/05/2023] predniSONE  5 mg Oral Q breakfast   sacubitril -valsartan   1 tablet Oral BID   sodium chloride  flush  3 mL Intravenous Q12H   spironolactone   12.5 mg Oral Daily   Continuous Infusions:   Anti-infectives (From admission, onward)    Start     Dose/Rate Route Frequency Ordered Stop   11/23/23 1000  doxycycline  (VIBRA -TABS) tablet 100  mg        100 mg Oral Every 12 hours 11/23/23 0753     11/22/23 1415  azithromycin (ZITHROMAX) tablet 500 mg        500 mg Oral Daily 11/22/23 1318 11/24/23 0832              Family Communication/Anticipated D/C date and plan/Code Status   DVT prophylaxis: enoxaparin  (LOVENOX ) injection 40 mg Start: 11/21/23 2200     Code Status: Full Code  Family Communication: None Disposition Plan: Plan to discharge home   Status is: Inpatient Remains inpatient appropriate because: COPD exacerbation, left gluteal abscess       Subjective:   Interval events noted.  He complains of cough and congestion.  He also complains of pain in the left buttock.  Objective:    Vitals:   11/25/23 2338 11/26/23 0351 11/26/23 0457 11/26/23 0741  BP: 122/60 137/83  (!) 142/76  Pulse: 66 63  (!) 57  Resp: 20 20  16   Temp: 98.3 F (36.8 C) 98.3 F (36.8 C)  97.9 F (36.6 C)  TempSrc: Oral Oral  Oral  SpO2: 97% 93%  97%  Weight:   88.6 kg   Height:       No data found.   Intake/Output Summary (Last 24 hours) at 11/26/2023 1111 Last data filed at 11/26/2023 1009 Gross per 24 hour  Intake 723 ml  Output 2280 ml  Net -1557 ml   Filed Weights   11/24/23 1208 11/25/23 0500 11/26/23 0457  Weight: 86.6 kg 87.7 kg 88.6 kg    Exam:  GEN: NAD SKIN: Warm and dry.  Very tender, hyperpigmented indurated area on the left lower medial buttock.  No surrounding erythema noted EYES: No pallor or icterus ENT: MMM CV: RRR PULM: Bilateral wheezing.  No rales. ABD: soft, ND, NT, +BS CNS: AAO x 3, non focal EXT: No edema or tenderness        Data Reviewed:   I have personally reviewed following labs and imaging studies:  Labs: Labs show the following:   Basic Metabolic Panel: Recent Labs  Lab 11/22/23 0535 11/22/23 0947 11/23/23 0517 11/24/23 0515 11/25/23 0227 11/26/23 0548  NA 141  --  143 142 142 142  K 3.8  --  3.6 3.7 3.5 3.7  CL 109  --  107 108 106 105  CO2 25  --   25 25 26 27   GLUCOSE 116*  --  126* 115* 120* 123*  BUN 18  --  22 25* 34* 39*  CREATININE 0.94  --  0.99 1.11 1.10 1.15  CALCIUM  8.1*  --  8.3* 8.3* 8.6* 8.3*  MG  --  2.2 2.3 2.3 2.2 2.2  PHOS  --  2.5 2.2*  --   --   --    GFR Estimated Creatinine Clearance: 60.8 mL/min (by C-G formula based on SCr of 1.15 mg/dL). Liver Function Tests: No results for input(s): AST, ALT, ALKPHOS, BILITOT, PROT, ALBUMIN in the last 168 hours. No results for input(s): LIPASE, AMYLASE in the last 168 hours. No results for input(s): AMMONIA in the last 168 hours. Coagulation profile No results for input(s): INR, PROTIME in the last 168 hours.  CBC: Recent Labs  Lab 11/21/23 1235 11/23/23 0517 11/24/23 0515 11/25/23 0227 11/26/23 0548  WBC 10.6* 14.2* 14.7* 17.7* 16.3*  HGB 9.9* 10.2* 10.5* 11.1* 10.4*  HCT 32.5* 32.9* 33.9* 34.9* 33.4*  MCV 91.8 90.4 91.9 89.3 89.3  PLT 246 252 243 292 272   Cardiac Enzymes: Recent Labs  Lab 11/23/23 0517  CKTOTAL 151   BNP (last 3 results) No results for input(s): PROBNP in the last 8760 hours. CBG: Recent Labs  Lab 11/25/23 1157 11/25/23 1705 11/25/23 1751 11/25/23 2033 11/26/23 0742  GLUCAP 268* 249* 200* 140* 116*   D-Dimer: No results for input(s): DDIMER in the last 72 hours. Hgb A1c: No results for input(s): HGBA1C in the last 72 hours. Lipid Profile: No results for input(s): CHOL, HDL, LDLCALC, TRIG, CHOLHDL, LDLDIRECT in the last 72 hours. Thyroid  function studies: No results for input(s): TSH, T4TOTAL, T3FREE, THYROIDAB in the last 72 hours.  Invalid input(s): FREET3 Anemia work up: No results for input(s): VITAMINB12, FOLATE, FERRITIN, TIBC, IRON , RETICCTPCT in the last 72 hours. Sepsis Labs: Recent Labs  Lab 11/22/23 0947 11/23/23 0517 11/24/23 0515 11/25/23 0227 11/26/23 0548  PROCALCITON <0.10  --   --   --   --   WBC  --  14.2* 14.7* 17.7* 16.3*     Microbiology Recent Results (from the past 240 hours)  Resp panel by RT-PCR (RSV, Flu A&B, Covid) Anterior Nasal Swab     Status: None   Collection Time: 11/21/23 12:35 PM   Specimen: Anterior Nasal Swab  Result Value Ref Range Status   SARS Coronavirus 2 by RT PCR NEGATIVE NEGATIVE Final    Comment: (NOTE) SARS-CoV-2 target nucleic acids are NOT DETECTED.  The SARS-CoV-2 RNA is generally detectable in upper respiratory specimens during the acute phase of infection. The lowest concentration of SARS-CoV-2 viral copies this assay can detect is 138 copies/mL. A negative result does not preclude SARS-Cov-2 infection and should not be used as the sole basis for treatment or other patient management decisions. A negative result may occur with  improper specimen collection/handling, submission of specimen other than nasopharyngeal swab, presence of viral mutation(s) within the areas targeted by this assay, and inadequate number of viral copies(<138 copies/mL). A negative result must be combined with clinical observations, patient history, and epidemiological information. The expected result is Negative.  Fact Sheet for Patients:  bloggercourse.com  Fact Sheet for Healthcare Providers:  seriousbroker.it  This test is no t yet approved or cleared by the [place]  FDA and  has been authorized for detection and/or diagnosis of SARS-CoV-2 by FDA under an Emergency Use Authorization (EUA). This EUA will remain  in effect (meaning this test can be used) for the duration of the COVID-19 declaration under Section 564(b)(1) of the Act, 21 U.S.C.section 360bbb-3(b)(1), unless the authorization is terminated  or revoked sooner.       Influenza A by PCR NEGATIVE NEGATIVE Final   Influenza B by PCR NEGATIVE  NEGATIVE Final    Comment: (NOTE) The Xpert Xpress SARS-CoV-2/FLU/RSV plus assay is intended as an aid in the diagnosis of  influenza from Nasopharyngeal swab specimens and should not be used as a sole basis for treatment. Nasal washings and aspirates are unacceptable for Xpert Xpress SARS-CoV-2/FLU/RSV testing.  Fact Sheet for Patients: bloggercourse.com  Fact Sheet for Healthcare Providers: seriousbroker.it  This test is not yet approved or cleared by the [place]  FDA and has been authorized for detection and/or diagnosis of SARS-CoV-2 by FDA under an Emergency Use Authorization (EUA). This EUA will remain in effect (meaning this test can be used) for the duration of the COVID-19 declaration under Section 564(b)(1) of the Act, 21 U.S.C. section 360bbb-3(b)(1), unless the authorization is terminated or revoked.     Resp Syncytial Virus by PCR NEGATIVE NEGATIVE Final    Comment: (NOTE) Fact Sheet for Patients: bloggercourse.com  Fact Sheet for Healthcare Providers: seriousbroker.it  This test is not yet approved or cleared by the [place]  FDA and has been authorized for detection and/or diagnosis of SARS-CoV-2 by FDA under an Emergency Use Authorization (EUA). This EUA will remain in effect (meaning this test can be used) for the duration of the COVID-19 declaration under Section 564(b)(1) of the Act, 21 U.S.C. section 360bbb-3(b)(1), unless the authorization is terminated or revoked.  Performed at [facility], [address] [place], [place] [address]   MRSA Next Gen by PCR, Nasal     Status: Abnormal   Collection Time: 11/23/23  8:15 AM   Specimen: Nasal Mucosa; Nasal Swab  Result Value Ref Range Status   MRSA by PCR Next Gen DETECTED (A) NOT DETECTED Final    Comment: RESULT CALLED TO, READ BACK BY AND VERIFIED WITH: MARINDA ADAM, RN 1020 11/23/23 GM (NOTE) The GeneXpert MRSA Assay (FDA approved for NASAL specimens only), is one component of a comprehensive MRSA  colonization surveillance program. It is not intended to diagnose MRSA infection nor to guide or monitor treatment for MRSA infections. Test performance is not FDA approved in patients less than 29 years old. Performed at [facility], [address] [place], [place] [address]   Respiratory (~20 pathogens) panel by PCR     Status: None   Collection Time: 11/23/23  8:15 AM   Specimen: Sputum; Respiratory  Result Value Ref Range Status   Adenovirus NOT DETECTED NOT DETECTED Final   Coronavirus 229E NOT DETECTED NOT DETECTED Final    Comment: (NOTE) The Coronavirus on the Respiratory Panel, DOES NOT test for the novel  Coronavirus (2019 nCoV)    Coronavirus HKU1 NOT DETECTED NOT DETECTED Final   Coronavirus NL63 NOT DETECTED NOT DETECTED Final   Coronavirus OC43 NOT DETECTED NOT DETECTED Final   Metapneumovirus NOT DETECTED NOT DETECTED Final   Rhinovirus / Enterovirus NOT DETECTED NOT DETECTED Final   Influenza A NOT DETECTED NOT DETECTED Final   Influenza B NOT DETECTED NOT DETECTED Final   Parainfluenza Virus 1 NOT DETECTED NOT DETECTED Final   Parainfluenza Virus 2 NOT DETECTED NOT DETECTED Final   Parainfluenza Virus 3 NOT DETECTED NOT DETECTED Final   Parainfluenza Virus 4 NOT DETECTED NOT DETECTED Final   Respiratory Syncytial Virus NOT DETECTED NOT DETECTED Final   Bordetella pertussis NOT DETECTED NOT DETECTED Final   Bordetella Parapertussis NOT DETECTED NOT DETECTED Final   Chlamydophila pneumoniae NOT DETECTED NOT DETECTED Final   Mycoplasma pneumoniae NOT DETECTED NOT DETECTED Final    Comment: Performed at [facility], [address], [place], [place] [address]    Procedures and diagnostic studies:  US  PELVIS LIMITED (TRANSABDOMINAL ONLY) Result Date: 11/25/2023 CLINICAL DATA:  Left buttock nodule EXAM: LIMITED ULTRASOUND OF PELVIS TECHNIQUE: Limited transabdominal ultrasound examination of the pelvis was performed. COMPARISON:  09/15/2023  FINDINGS: Sonographic evaluation of the palpable area in the left gluteal region was performed. There is subcutaneous edema within the area of concern, with a 0.8 cm fluid collection within the subcutaneous fat. No other sonographic abnormalities. IMPRESSION: 1. Subcutaneous edema within the area of concern, with 0.8 cm fluid collection as above. This could reflect cellulitis with small subcutaneous abscess. Electronically Signed   By: Ozell Daring M.D.   On: 11/25/2023 20:49               LOS: 4 days   Shabrea Weldin  [facility]   Pager on [email or web address]. If 7PM-7AM, please contact night-coverage at [email or web address]     11/26/2023, 11:11 AM

## 2023-11-26 NOTE — Progress Notes (Signed)
 Mobility Specialist - Progress Note   11/26/23 0900  Mobility  Activity Ambulated with assistance  Level of Assistance Independent after set-up  Assistive Device None  Distance Ambulated (ft) 320 ft  Range of Motion/Exercises All extremities  Activity Response Tolerated well  Mobility visit 1 Mobility  Mobility Specialist Start Time (ACUTE ONLY) [id number]  Mobility Specialist Stop Time (ACUTE ONLY) 0910  Mobility Specialist Time Calculation (min) (ACUTE ONLY) 31 min   Pt was supine in bed upon entry. Pt agreed to mobility. Pt was able to get to the EOB independently with no AD. Pt is able to STS independently with no AD. Pt ambulated well with no AD. Pt did not need a recovery break throughout activity. After activity pt returned to the room with needs in reach.  Clem Rodes Mobility Specialist 11/26/23, 9:49 AM

## 2023-11-27 DIAGNOSIS — I5043 Acute on chronic combined systolic (congestive) and diastolic (congestive) heart failure: Secondary | ICD-10-CM | POA: Diagnosis not present

## 2023-11-27 DIAGNOSIS — L0231 Cutaneous abscess of buttock: Secondary | ICD-10-CM | POA: Diagnosis not present

## 2023-11-27 LAB — BASIC METABOLIC PANEL WITH GFR
Anion gap: 9 (ref 5–15)
BUN: 40 mg/dL — ABNORMAL HIGH (ref 8–23)
CO2: 26 mmol/L (ref 22–32)
Calcium: 8 mg/dL — ABNORMAL LOW (ref 8.9–10.3)
Chloride: 105 mmol/L (ref 98–111)
Creatinine, Ser: 1.28 mg/dL — ABNORMAL HIGH (ref 0.61–1.24)
GFR, Estimated: 58 mL/min — ABNORMAL LOW (ref >60)
Glucose, Bld: 136 mg/dL — ABNORMAL HIGH (ref 70–99)
Potassium: 3.7 mmol/L (ref 3.5–5.1)
Sodium: 140 mmol/L (ref 135–145)

## 2023-11-27 LAB — GLUCOSE, CAPILLARY
Glucose-Capillary: 113 mg/dL — ABNORMAL HIGH (ref 70–99)
Glucose-Capillary: 166 mg/dL — ABNORMAL HIGH (ref 70–99)
Glucose-Capillary: 250 mg/dL — ABNORMAL HIGH (ref 70–99)
Glucose-Capillary: 182 mg/dL — ABNORMAL HIGH (ref 70–99)

## 2023-11-27 LAB — MAGNESIUM: Magnesium: 2.5 mg/dL — ABNORMAL HIGH (ref 1.7–2.4)

## 2023-11-27 LAB — CBC
HCT: 33.6 % — ABNORMAL LOW (ref 39.0–52.0)
Hemoglobin: 10.4 g/dL — ABNORMAL LOW (ref 13.0–17.0)
MCH: 28.1 pg (ref 26.0–34.0)
MCHC: 31 g/dL (ref 30.0–36.0)
MCV: 90.8 fL (ref 80.0–100.0)
Platelets: 274 K/uL (ref 150–400)
RBC: 3.7 MIL/uL — ABNORMAL LOW (ref 4.22–5.81)
RDW: 18.6 % — ABNORMAL HIGH (ref 11.5–15.5)
WBC: 17.1 K/uL — ABNORMAL HIGH (ref 4.0–10.5)
nRBC: 0 % (ref 0.0–0.2)

## 2023-11-27 MED ORDER — VANCOMYCIN HCL 1500 MG/300ML IV SOLN
1500.0000 mg | INTRAVENOUS | Status: DC
  Filled 2023-11-27: qty 300

## 2023-11-27 MED ORDER — DOXYCYCLINE HYCLATE 100 MG PO TABS
100.0000 mg | ORAL_TABLET | Freq: Two times a day (BID) | ORAL | Status: DC
  Administered 2023-11-27 – 2023-11-29 (×4): 100 mg via ORAL
  Filled 2023-11-27 (×4): qty 1

## 2023-11-27 NOTE — Progress Notes (Signed)
 Mobility Specialist - Progress Note   11/27/23 0900  Mobility  Activity Ambulated with assistance;Stood at bedside  Level of Assistance Standby assist, set-up cues, supervision of patient - no hands on  Assistive Device  (IV Stand)  Distance Ambulated (ft) 185 ft  Range of Motion/Exercises All extremities  Activity Response Tolerated well  Mobility visit 1 Mobility  Mobility Specialist Start Time (ACUTE ONLY) 0846  Mobility Specialist Stop Time (ACUTE ONLY) 0900  Mobility Specialist Time Calculation (min) (ACUTE ONLY) 14 min   Pt was supine in bed on RA upon entry. Pt agreed to mobility. Pt is receiving IV fluids. Pt is able today to get to the EOB independently. Pt is able today to STS independently with no AD. Pt ambulated well today. Pt did not need any recovery breaks today. After activity pt returned to the room in bed with needs in reach.  Clem Rodes Mobility Specialist 11/27/23, 9:05 AM

## 2023-11-27 NOTE — Progress Notes (Addendum)
 Patient with left buttock abscess. I and D performed at bedside today. Packed with iodoform gauze. Recommend iodoform gauze packing daily for 5 days. Continue abx, okay from my standpoint to transition to PO.   I and D Note After simple informed consent was obtained the left buttock was cleansed with iodine. Using an 11 blade scalpel the abscess was opened at the point of fluctuance. Approximately 1cc of pus was expressed. The pocket was broken up bluntly. Iodoform gauze was packedin the wound and dressed with an adhesive dressing.  Addendum:  Patient should pack wound for 5 days total. Then can let heal on its own. Does not need routine follow up with general surgery unless their is concern that abscess has recurred. Surgery team to sign off, please call with questions or concerns.

## 2023-11-27 NOTE — Progress Notes (Addendum)
 Progress Note    Paul Cheatwood [name]  FMW:[id number] DOB: [birth year]  DOA: 11/21/2023 PCP: Cyrus Selinda Moose, PA-C      Brief Narrative:    Medical records reviewed and are as summarized below:  Paul Nadara Browner [name] is a 78 y.o. male  with medical history significant of chronic combined HFrEF and HFpEF, CLL, HTN, HLD, IIDM, hypothyroidism, CKD stage IIIa, BPH, SDH, OSA not on CPAP, chronic pain and narcotic dependence, COPD Gold stage I?,  Who presented to the hospital with cough, wheezing and shortness of breath.  Symptoms started about 2 weeks prior to admission.  He was prescribed a course of Augmentin  but symptoms did not improve.  He developed orthopnea and noticed that he weight had been fluctuating.  Vitals in the ED: Temperature 97.9 F, respiratory 25, pulse 69, BP 149/85, O2 sat 100% on room air.   Chest x-ray showed interstitial edema     Assessment/Plan:   Principal Problem:   CHF (congestive heart failure) (HCC) Active Problems:   Acute on chronic combined systolic and diastolic CHF (congestive heart failure) (HCC)    Body mass index is 26.18 kg/m.    Acute on chronic combined diastolic and systolic CHF: Continue oral Lasix .  Continue empagliflozin , Toprol -XL, spironolactone  and Entresto . S/p treatment with IV Lasix . Monitor BMP, daily weight and urine output. Weight down from 210 pounds to 198 pounds. Net fluid loss over 14.5 L. 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    COPD exacerbation: Completed course of azithromycin and doxycycline . Continue prednisone taper.   Leukocytosis: Steroids may be contributing to leukocytosis.  It could also be from left gluteal cellulitis/abscess. Left gluteal cellulitis and abscess: Continue IV Zosyn   and vancomycin . Follow-up with general surgeon.   Comorbidities include hypertension, hypothyroidism, well-controlled type II DM, OSA on CPAP at night, history of CLL   Transfer from progressive care unit to MedSurg unit  ADDENDUM  Patient underwent I&D of left buttock abscess today.  (performed by Dr. Marinda, surgeon).  Dr. Marinda is okay with transitioning from IV to oral antibiotics.  Discontinue IV vancomycin  and Zosyn  and restart doxycycline .    Diet Order             Diet regular Fluid consistency: Thin  Diet effective now                                  Consultants: General Surgeon Pulmonologist  Procedures: None    Medications:    aspirin  EC  81 mg Oral Daily   budesonide-glycopyrrolate -formoterol  2 puff Inhalation BID   empagliflozin   10 mg Oral QAC breakfast   enoxaparin  (LOVENOX ) injection  40 mg Subcutaneous QHS   furosemide   40 mg Oral Daily   guaiFENesin   600 mg Oral BID   hydrALAZINE   100 mg Oral BID   insulin  aspart  0-15 Units Subcutaneous TID WC   levothyroxine   200 mcg Oral Q0600   loratadine  10 mg Oral Daily   metoprolol  succinate  25 mg Oral Daily   mirtazapine  7.5 mg Oral QHS   [START ON 11/28/2023] predniSONE  40 mg Oral Q breakfast   Followed by   NOREEN ON 11/29/2023] predniSONE  35 mg Oral  Q breakfast   Followed by   NOREEN ON 11/30/2023] predniSONE  30 mg Oral Q breakfast   Followed by   NOREEN ON 12/01/2023] predniSONE  25 mg Oral Q breakfast   Followed by   NOREEN ON 12/02/2023] predniSONE  20 mg Oral Q breakfast   Followed by   NOREEN ON 12/03/2023] predniSONE  15 mg Oral Q breakfast   Followed by   NOREEN ON 12/04/2023] predniSONE  10 mg Oral Q breakfast   Followed by   NOREEN ON 12/05/2023] predniSONE  5 mg Oral Q breakfast   sacubitril -valsartan   1 tablet Oral BID   sodium chloride  flush  3 mL Intravenous Q12H   spironolactone   12.5 mg Oral Daily   Continuous Infusions:  piperacillin -tazobactam (ZOSYN )   IV 3.375 g (11/27/23 9360)   vancomycin        Anti-infectives (From admission, onward)    Start     Dose/Rate Route Frequency Ordered Stop   11/27/23 1400  vancomycin  (VANCOREADY) IVPB 1750 mg/350 mL  Status:  Discontinued        1,750 mg 175 mL/hr over 120 Minutes Intravenous Every 24 hours 11/26/23 1247 11/27/23 0823   11/27/23 1400  vancomycin  (VANCOREADY) IVPB 1500 mg/300 mL        1,500 mg 150 mL/hr over 120 Minutes Intravenous Every 24 hours 11/27/23 0823     11/26/23 2000  piperacillin -tazobactam (ZOSYN ) IVPB 3.375 g        3.375 g 12.5 mL/hr over 240 Minutes Intravenous Every 8 hours 11/26/23 1247     11/26/23 1215  vancomycin  (VANCOREADY) IVPB 2000 mg/400 mL        2,000 mg 200 mL/hr over 120 Minutes Intravenous  Once 11/26/23 1123 11/26/23 1523   11/26/23 1215  piperacillin -tazobactam (ZOSYN ) IVPB 3.375 g        3.375 g 12.5 mL/hr over 240 Minutes Intravenous  Once 11/26/23 1123 11/27/23 0853   11/23/23 1000  doxycycline  (VIBRA -TABS) tablet 100 mg  Status:  Discontinued        100 mg Oral Every 12 hours 11/23/23 0753 11/26/23 1117   11/22/23 1415  azithromycin (ZITHROMAX) tablet 500 mg        500 mg Oral Daily 11/22/23 1318 11/24/23 0832              Family Communication/Anticipated D/C date and plan/Code Status   DVT prophylaxis: enoxaparin  (LOVENOX ) injection 40 mg Start: 11/21/23 2200     Code Status: Full Code  Family Communication: None Disposition Plan: Plan to discharge home   Status is: Inpatient Remains inpatient appropriate because: COPD exacerbation, left gluteal abscess       Subjective:   Interval events noted.  Breathing is better.  He still has significant pain in the left buttock. [place], RN, at the bedside  Objective:    Vitals:   11/27/23 0017 11/27/23 0500 11/27/23 0817 11/27/23 1130  BP: (!) 113/52  130/76 115/70  Pulse: 64  (!) 57 64  Resp: 20  18 18   Temp: 97.8 F (36.6 C)  98.4 F (36.9 C) 98.2 F (36.8 C)   TempSrc: Oral     SpO2: 96%  96% 98%  Weight:  90 kg    Height:       No data found.   Intake/Output Summary (Last 24 hours) at 11/27/2023 1217 Last data filed at 11/27/2023 1130 Gross per 24 hour  Intake 2847.75 ml  Output 2550 ml  Net 297.75 ml   [employer]  11/25/23 0500 11/26/23 0457 11/27/23 0500  Weight: 87.7 kg 88.6 kg 90 kg    Exam:  GEN: NAD SKIN: Tender, hard, indurated area on the left lower medial buttock.  No drainage noted.  No erythema. EYES: No pallor or icterus ENT: MMM CV: RRR PULM: Mild bilateral wheezing.  No rales. ABD: soft, ND, NT, +BS CNS: AAO x 3, non focal EXT: No edema or tenderness       Data Reviewed:   I have personally reviewed following labs and imaging studies:  Labs: Labs show the following:   Basic Metabolic Panel: Recent Labs  Lab 11/22/23 0947 11/23/23 0517 11/24/23 0515 11/25/23 0227 11/26/23 0548 11/27/23 0449  NA  --  143 142 142 142 140  K  --  3.6 3.7 3.5 3.7 3.7  CL  --  107 108 106 105 105  CO2  --  25 25 26 27 26   GLUCOSE  --  126* 115* 120* 123* 136*  BUN  --  22 25* 34* 39* 40*  CREATININE  --  0.99 1.11 1.10 1.15 1.28*  CALCIUM   --  8.3* 8.3* 8.6* 8.3* 8.0*  MG 2.2 2.3 2.3 2.2 2.2 2.5*  PHOS 2.5 2.2*  --   --   --   --    GFR Estimated Creatinine Clearance: 54.6 mL/min (A) (by C-G formula based on SCr of 1.28 mg/dL (H)). Liver Function Tests: No results for input(s): AST, ALT, ALKPHOS, BILITOT, PROT, ALBUMIN in the last 168 hours. No results for input(s): LIPASE, AMYLASE in the last 168 hours. No results for input(s): AMMONIA in the last 168 hours. Coagulation profile No results for input(s): INR, PROTIME in the last 168 hours.  CBC: Recent Labs  Lab 11/23/23 0517 11/24/23 0515 11/25/23 0227 11/26/23 0548 11/27/23 0449  WBC 14.2* 14.7* 17.7* 16.3* 17.1*  HGB 10.2* 10.5* 11.1* 10.4* 10.4*  HCT 32.9* 33.9* 34.9* 33.4* 33.6*  MCV 90.4 91.9 89.3 89.3 90.8  PLT 252  243 292 272 274   Cardiac Enzymes: Recent Labs  Lab 11/23/23 0517  CKTOTAL 151   BNP (last 3 results) No results for input(s): PROBNP in the last 8760 hours. CBG: Recent Labs  Lab 11/26/23 0742 11/26/23 1138 11/26/23 2112 11/27/23 0816 11/27/23 1129  GLUCAP 116* 212* 242* 113* 166*   D-Dimer: No results for input(s): DDIMER in the last 72 hours. Hgb A1c: No results for input(s): HGBA1C in the last 72 hours. Lipid Profile: No results for input(s): CHOL, HDL, LDLCALC, TRIG, CHOLHDL, LDLDIRECT in the last 72 hours. Thyroid  function studies: No results for input(s): TSH, T4TOTAL, T3FREE, THYROIDAB in the last 72 hours.  Invalid input(s): FREET3 Anemia work up: No results for input(s): VITAMINB12, FOLATE, FERRITIN, TIBC, IRON , RETICCTPCT in the last 72 hours. Sepsis Labs: Recent Labs  Lab 11/22/23 0947 11/23/23 0517 11/24/23 0515 11/25/23 0227 11/26/23 0548 11/27/23 0449  PROCALCITON <0.10  --   --   --   --   --   WBC  --    < > 14.7* 17.7* 16.3* 17.1*   < > = values in this interval not displayed.    Microbiology Recent Results (from the past 240 hours)  Resp panel by RT-PCR (RSV, Flu A&B, Covid) Anterior Nasal Swab     Status: None   Collection Time: 11/21/23 12:35 PM   Specimen: Anterior Nasal Swab  Result Value Ref Range Status   SARS Coronavirus 2 by RT PCR NEGATIVE NEGATIVE Final    Comment: (NOTE) SARS-CoV-2  target nucleic acids are NOT DETECTED.  The SARS-CoV-2 RNA is generally detectable in upper respiratory specimens during the acute phase of infection. The lowest concentration of SARS-CoV-2 viral copies this assay can detect is 138 copies/mL. A negative result does not preclude SARS-Cov-2 infection and should not be used as the sole basis for treatment or other patient management decisions. A negative result may occur with  improper specimen collection/handling, submission of specimen other than  nasopharyngeal swab, presence of viral mutation(s) within the areas targeted by this assay, and inadequate number of viral copies(<138 copies/mL). A negative result must be combined with clinical observations, patient history, and epidemiological information. The expected result is Negative.  Fact Sheet for Patients:  bloggercourse.com  Fact Sheet for Healthcare Providers:  seriousbroker.it  This test is no t yet approved or cleared by the [place]  FDA and  has been authorized for detection and/or diagnosis of SARS-CoV-2 by FDA under an Emergency Use Authorization (EUA). This EUA will remain  in effect (meaning this test can be used) for the duration of the COVID-19 declaration under Section 564(b)(1) of the Act, 21 U.S.C.section 360bbb-3(b)(1), unless the authorization is terminated  or revoked sooner.       Influenza A by PCR NEGATIVE NEGATIVE Final   Influenza B by PCR NEGATIVE NEGATIVE Final    Comment: (NOTE) The Xpert Xpress SARS-CoV-2/FLU/RSV plus assay is intended as an aid in the diagnosis of influenza from Nasopharyngeal swab specimens and should not be used as a sole basis for treatment. Nasal washings and aspirates are unacceptable for Xpert Xpress SARS-CoV-2/FLU/RSV testing.  Fact Sheet for Patients: bloggercourse.com  Fact Sheet for Healthcare Providers: seriousbroker.it  This test is not yet approved or cleared by the [place]  FDA and has been authorized for detection and/or diagnosis of SARS-CoV-2 by FDA under an Emergency Use Authorization (EUA). This EUA will remain in effect (meaning this test can be used) for the duration of the COVID-19 declaration under Section 564(b)(1) of the Act, 21 U.S.C. section 360bbb-3(b)(1), unless the authorization is terminated or revoked.     Resp Syncytial Virus by PCR NEGATIVE NEGATIVE Final    Comment:  (NOTE) Fact Sheet for Patients: bloggercourse.com  Fact Sheet for Healthcare Providers: seriousbroker.it  This test is not yet approved or cleared by the [place]  FDA and has been authorized for detection and/or diagnosis of SARS-CoV-2 by FDA under an Emergency Use Authorization (EUA). This EUA will remain in effect (meaning this test can be used) for the duration of the COVID-19 declaration under Section 564(b)(1) of the Act, 21 U.S.C. section 360bbb-3(b)(1), unless the authorization is terminated or revoked.  Performed at [facility], [address] [place], [place] [address]   MRSA Next Gen by PCR, Nasal     Status: Abnormal   Collection Time: 11/23/23  8:15 AM   Specimen: Nasal Mucosa; Nasal Swab  Result Value Ref Range Status   MRSA by PCR Next Gen DETECTED (A) NOT DETECTED Final    Comment: RESULT CALLED TO, READ BACK BY AND VERIFIED WITH: MARINDA ADAM, RN 1020 11/23/23 GM (NOTE) The GeneXpert MRSA Assay (FDA approved for NASAL specimens only), is one component of a comprehensive MRSA colonization surveillance program. It is not intended to diagnose MRSA infection nor to guide or monitor treatment for MRSA infections. Test performance is not FDA approved in patients less than 15 years old. Performed at [facility], [address], [place], [place] [address]   Respiratory (~20 pathogens) panel by  PCR     Status: None   Collection Time: 11/23/23  8:15 AM   Specimen: Sputum; Respiratory  Result Value Ref Range Status   Adenovirus NOT DETECTED NOT DETECTED Final   Coronavirus 229E NOT DETECTED NOT DETECTED Final    Comment: (NOTE) The Coronavirus on the Respiratory Panel, DOES NOT test for the novel  Coronavirus (2019 nCoV)    Coronavirus HKU1 NOT DETECTED NOT DETECTED Final   Coronavirus NL63 NOT DETECTED NOT DETECTED Final   Coronavirus OC43 NOT DETECTED NOT DETECTED Final    Metapneumovirus NOT DETECTED NOT DETECTED Final   Rhinovirus / Enterovirus NOT DETECTED NOT DETECTED Final   Influenza A NOT DETECTED NOT DETECTED Final   Influenza B NOT DETECTED NOT DETECTED Final   Parainfluenza Virus 1 NOT DETECTED NOT DETECTED Final   Parainfluenza Virus 2 NOT DETECTED NOT DETECTED Final   Parainfluenza Virus 3 NOT DETECTED NOT DETECTED Final   Parainfluenza Virus 4 NOT DETECTED NOT DETECTED Final   Respiratory Syncytial Virus NOT DETECTED NOT DETECTED Final   Bordetella pertussis NOT DETECTED NOT DETECTED Final   Bordetella Parapertussis NOT DETECTED NOT DETECTED Final   Chlamydophila pneumoniae NOT DETECTED NOT DETECTED Final   Mycoplasma pneumoniae NOT DETECTED NOT DETECTED Final    Comment: Performed at [facility], [address], [place], [place] [address]    Procedures and diagnostic studies:  US  PELVIS LIMITED (TRANSABDOMINAL ONLY) Result Date: 11/25/2023 CLINICAL DATA:  Left buttock nodule EXAM: LIMITED ULTRASOUND OF PELVIS TECHNIQUE: Limited transabdominal ultrasound examination of the pelvis was performed. COMPARISON:  09/15/2023 FINDINGS: Sonographic evaluation of the palpable area in the left gluteal region was performed. There is subcutaneous edema within the area of concern, with a 0.8 cm fluid collection within the subcutaneous fat. No other sonographic abnormalities. IMPRESSION: 1. Subcutaneous edema within the area of concern, with 0.8 cm fluid collection as above. This could reflect cellulitis with small subcutaneous abscess. Electronically Signed   By: Ozell Daring M.D.   On: 11/25/2023 20:49               LOS: 5 days   Aleigh Grunden  [facility]   Pager on [email or web address]. If 7PM-7AM, please contact night-coverage at [email or web address]     11/27/2023, 12:17 PM

## 2023-11-27 NOTE — Plan of Care (Signed)

## 2023-11-27 NOTE — Progress Notes (Signed)
 Pharmacy Antibiotic Note  Paul Briggs. is a 78 y.o. male admitted on 11/21/2023 with cellulitis/abscess. 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   11/1: Patient presenting with left buttock cellulitis. Also, ultrasound of pelvis was done, which reflects cellulitis with small subcutaneous abscess. Previously, failed outpatient Augmentin  course. Also, recently completed short course of Azithromycin and Doxycyline earlier this admission for COPD exacerbation. Renal function is currently stable. Will continue to monitor.   Plan: Pt received vancomycin  2000 mg x 1 loading dose. Scr trending up today, so will adjust vancomycin  dose from 1750 mg to 1500 mg q24H. Predicted AUC of 543. Goal AUC 400-550. Vd 0.72, Scr 1.28, IBW. Plan to order levels after 4th or 5th dose.   Height: 6' 1 (185.4 cm) Weight: 90 kg (198 lb 6.6 oz) IBW/kg (Calculated) : 79.9  Temp (24hrs), Avg:98.2 F (36.8 C), Min:97.8 F (36.6 C), Max:98.6 F (37 C)  Recent Labs  Lab 11/23/23 0517 11/24/23 0515 11/25/23 0227 11/26/23 0548 11/27/23 0449  WBC 14.2* 14.7* 17.7* 16.3* 17.1*  CREATININE 0.99 1.11 1.10 1.15 1.28*    Estimated Creatinine Clearance: 54.6 mL/min (A) (by C-G formula based on SCr of 1.28 mg/dL (H)).    No Known Allergies  Antimicrobials this admission: Azithromycin 10/28 >> 10/30 Doxycycline  10/29 >> 11/1 Zosyn  11/1 >> Vancomycin  11/1 >>  Microbiology results: 10/30 Expectorated Sputum Assessment: sent   10/29 Resp Panel: negative 10/29 MRSA PCR: positive   Thank you for allowing pharmacy to be a part of this patient's care.   Cathaleen Blanch, PharmD, BCPS [facility]  11/27/2023 8:23 AM

## 2023-11-28 ENCOUNTER — Other Ambulatory Visit: Payer: Self-pay

## 2023-11-28 DIAGNOSIS — I5043 Acute on chronic combined systolic (congestive) and diastolic (congestive) heart failure: Secondary | ICD-10-CM | POA: Diagnosis not present

## 2023-11-28 LAB — GLUCOSE, CAPILLARY
Glucose-Capillary: 173 mg/dL — ABNORMAL HIGH (ref 70–99)
Glucose-Capillary: 144 mg/dL — ABNORMAL HIGH (ref 70–99)
Glucose-Capillary: 222 mg/dL — ABNORMAL HIGH (ref 70–99)
Glucose-Capillary: 242 mg/dL — ABNORMAL HIGH (ref 70–99)

## 2023-11-28 LAB — CBC
HCT: 32.4 % — ABNORMAL LOW (ref 39.0–52.0)
Hemoglobin: 10.3 g/dL — ABNORMAL LOW (ref 13.0–17.0)
MCH: 28.5 pg (ref 26.0–34.0)
MCHC: 31.8 g/dL (ref 30.0–36.0)
MCV: 89.8 fL (ref 80.0–100.0)
Platelets: 253 K/uL (ref 150–400)
RBC: 3.61 MIL/uL — ABNORMAL LOW (ref 4.22–5.81)
RDW: 18.4 % — ABNORMAL HIGH (ref 11.5–15.5)
WBC: 16.9 K/uL — ABNORMAL HIGH (ref 4.0–10.5)
nRBC: 0 % (ref 0.0–0.2)

## 2023-11-28 LAB — BASIC METABOLIC PANEL WITH GFR
Anion gap: 8 (ref 5–15)
BUN: 41 mg/dL — ABNORMAL HIGH (ref 8–23)
CO2: 26 mmol/L (ref 22–32)
Calcium: 7.9 mg/dL — ABNORMAL LOW (ref 8.9–10.3)
Chloride: 106 mmol/L (ref 98–111)
Creatinine, Ser: 1.17 mg/dL (ref 0.61–1.24)
GFR, Estimated: >60 mL/min (ref >60)
Glucose, Bld: 117 mg/dL — ABNORMAL HIGH (ref 70–99)
Potassium: 3.8 mmol/L (ref 3.5–5.1)
Sodium: 140 mmol/L (ref 135–145)

## 2023-11-28 LAB — MAGNESIUM: Magnesium: 2.9 mg/dL — ABNORMAL HIGH (ref 1.7–2.4)

## 2023-11-28 NOTE — Plan of Care (Signed)

## 2023-11-28 NOTE — Progress Notes (Signed)
Report given to [place], [place]

## 2023-11-28 NOTE — Progress Notes (Signed)
 Mobility Specialist Progress Note:    11/28/23 0903  Mobility  Activity Ambulated with assistance  Level of Assistance Standby assist, set-up cues, supervision of patient - no hands on  Assistive Device None  Distance Ambulated (ft) 200 ft  Range of Motion/Exercises Active;All extremities  Activity Response Tolerated well  Mobility visit 1 Mobility  Mobility Specialist Start Time (ACUTE ONLY) 0850  Mobility Specialist Stop Time (ACUTE ONLY) 0901  Mobility Specialist Time Calculation (min) (ACUTE ONLY) 11 min   Pt received in bed, NT in room. Agreeable to mobility, required SBA to stand and ambulate with no AD. Tolerated well, asx throughout. Returned to room, left pt sitting EOB. All needs met.  Sherrilee Ditty Mobility Specialist Please contact via Special Educational Needs Teacher or  Rehab office at [phone]

## 2023-11-28 NOTE — TOC Progression Note (Signed)
 Transition of Care (TOC) - Progression Note    Patient Details  Name: Paul Zeiner [name] MRN: [id number] Date of Birth: 09-May-1945  Transition of Care [facility]) CM/SW Contact  Corean ONEIDA Haddock, RN Phone Number: 11/28/2023, 4:17 PM  Clinical Narrative:    Notified by MD that patient will require [facility] for wound care at discharge.  Referral sent out in the HUB for review    Expected Discharge Plan: Home/Self Care Barriers to Discharge: Continued Medical Work up               Expected Discharge Plan and Services     Post Acute Care Choice: NA Living arrangements for the past 2 months: Single Family Home                                       Social Drivers of Health (SDOH) Interventions SDOH Screenings   Food Insecurity: No Food Insecurity (11/21/2023)  Housing: Low Risk  (11/21/2023)  Transportation Needs: No Transportation Needs (11/21/2023)  Utilities: Not At Risk (11/21/2023)  Alcohol Screen: Low Risk  (01/22/2022)  Depression (PHQ2-9): Low Risk  (11/01/2023)  Financial Resource Strain: Patient Declined (04/26/2023)   Received [facility]  Recent Concern: Financial Resource Strain - Medium Risk (02/24/2023)   Received from [facility]  Physical Activity: Unknown (07/26/2022)  Social Connections: Moderately Integrated (11/21/2023)  Stress: Stress Concern Present (07/26/2022)  Tobacco Use: Medium Risk (11/21/2023)    Readmission Risk Interventions    11/23/2023   11:46 AM 10/01/2022    9:51 AM 08/29/2022   10:57 AM  Readmission Risk Prevention Plan  Transportation Screening Complete Complete Complete  PCP or Specialist Appt within 3-5 Days  Complete   HRI or Home Care Consult  Patient refused   Social Work Consult for Recovery Care Planning/Counseling  Complete   Palliative Care Screening  Not Applicable   Medication Review Oceanographer) Complete Complete Complete  PCP or Specialist appointment within 3-5 days  of discharge Complete  Complete  HRI or Home Care Consult   Complete  SW Recovery Care/Counseling Consult Complete  Complete  Palliative Care Screening Not Applicable  Not Applicable  Skilled Nursing Facility Not Applicable  Complete

## 2023-11-28 NOTE — Progress Notes (Signed)
 PROGRESS NOTE    Paul Seifer [name]  FMW:[id number] DOB: 08-08-1945 DOA: 11/21/2023 PCP: Cyrus Selinda Moose, PA-C   Assessment & Plan:   Principal Problem:   CHF (congestive heart failure) (HCC) Active Problems:   Acute on chronic combined systolic and diastolic CHF (congestive heart failure) (HCC)  Assessment and Plan: Acute on chronic combined CHF exacerbation: continue on po lasix . Monitor I/Os. Continue on aldactone , entresto , hydralazine     COPD exacerbation: completed azithromycin course. Continue on steroid taper, doxy, & bronchodilators. Encourage incentive spirometry. D/c home steroid taper & breztri as per pulmon. Will need to outpatient w/ Dr. Aleskerov within 1 month. Pulmon signed off 11/25/23   Left buttocks abscess: s/p bedside I&D of left buttocks abscess as per gen surg.  Continue w/ wound care ( pack wound x 5 days as per gen surg). Continue on doxy.    HTN: continue on home dose of entresto , metoprolol , aldactone , hydralazine     H/o CLL: management per on[facility] outpatient    Hypothyroidism: continue on levothyroxine     DM2: well controlled, HbA1c 5.5. Continue on SSI w/ accuchecks.  Holding home metformin    Hx of OSA: does not use CPAP at home. CPAP at bedtime prn         DVT prophylaxis: lovenox  Code Status: full  Family Communication: discussed pt's care w/ pt's daughter, Paul Briggs, and answered her questions  Disposition Plan: will d/c home tomorrow  Level of care: Med-Surg  Status is: Inpatient Remains inpatient appropriate because: severity of illness    Consultants:  Gen surg  pulmon  Procedures:   Antimicrobials: doxy   Subjective: Pt c/o cough   Objective: Vitals:   11/27/23 1809 11/27/23 2026 11/28/23 0307 11/28/23 0843  BP: 138/64 123/69 134/83 129/71  Pulse: 68 67 64 61  Resp: 16 17 18 18   Temp: 98.3 F (36.8 C) 98.9 F (37.2 C) 98 F (36.7 C) 98.6 F (37 C)  TempSrc: Oral   Oral  SpO2: 97%  97% 100%  Weight:       Height:        Intake/Output Summary (Last 24 hours) at 11/28/2023 0914 Last data filed at 11/28/2023 0848 Gross per 24 hour  Intake 972.25 ml  Output 2250 ml  Net -1277.75 ml   Filed Weights   11/25/23 0500 11/26/23 0457 11/27/23 0500  Weight: 87.7 kg 88.6 kg 90 kg    Examination:  General exam: Appears calm and comfortable  Respiratory system: decreased breath sounds b/l. Intermittent rales Cardiovascular system: S1 & S2 +. No rubs, gallops or clicks.  Gastrointestinal system: Abdomen is nondistended, soft and nontender. Normal bowel sounds heard. Central nervous system: Alert and oriented. Moves all extremities Psychiatry: Judgement and insight appear normal. Mood & affect appropriate.     Data Reviewed: I have personally reviewed following labs and imaging studies  CBC: Recent Labs  Lab 11/24/23 0515 11/25/23 0227 11/26/23 0548 11/27/23 0449 11/28/23 0543  WBC 14.7* 17.7* 16.3* 17.1* 16.9*  HGB 10.5* 11.1* 10.4* 10.4* 10.3*  HCT 33.9* 34.9* 33.4* 33.6* 32.4*  MCV 91.9 89.3 89.3 90.8 89.8  PLT 243 292 272 274 253   Basic Metabolic Panel: Recent Labs  Lab 11/22/23 0947 11/23/23 0517 11/24/23 0515 11/25/23 0227 11/26/23 0548 11/27/23 0449 11/28/23 0543  NA  --  143 142 142 142 140 140  K  --  3.6 3.7 3.5 3.7 3.7 3.8  CL  --  107 108 106 105 105 106  CO2  --  25  25 26 27 26 26   GLUCOSE  --  126* 115* 120* 123* 136* 117*  BUN  --  22 25* 34* 39* 40* 41*  CREATININE  --  0.99 1.11 1.10 1.15 1.28* 1.17  CALCIUM   --  8.3* 8.3* 8.6* 8.3* 8.0* 7.9*  MG 2.2 2.3 2.3 2.2 2.2 2.5* 2.9*  PHOS 2.5 2.2*  --   --   --   --   --    GFR: Estimated Creatinine Clearance: 59.8 mL/min (by C-G formula based on SCr of 1.17 mg/dL). Liver Function Tests: No results for input(s): AST, ALT, ALKPHOS, BILITOT, PROT, ALBUMIN in the last 168 hours. No results for input(s): LIPASE, AMYLASE in the last 168 hours. No results for input(s): AMMONIA in the last  168 hours. Coagulation Profile: No results for input(s): INR, PROTIME in the last 168 hours. Cardiac Enzymes: Recent Labs  Lab 11/23/23 0517  CKTOTAL 151   BNP (last 3 results) No results for input(s): PROBNP in the last 8760 hours. HbA1C: No results for input(s): HGBA1C in the last 72 hours. CBG: Recent Labs  Lab 11/27/23 0816 11/27/23 1129 11/27/23 1805 11/27/23 2218 11/28/23 0847  GLUCAP 113* 166* 250* 182* 173*   Lipid Profile: No results for input(s): CHOL, HDL, LDLCALC, TRIG, CHOLHDL, LDLDIRECT in the last 72 hours. Thyroid  Function Tests: No results for input(s): TSH, T4TOTAL, FREET4, T3FREE, THYROIDAB in the last 72 hours. Anemia Panel: No results for input(s): VITAMINB12, FOLATE, FERRITIN, TIBC, IRON , RETICCTPCT in the last 72 hours. Sepsis Labs: Recent Labs  Lab 11/22/23 0947  PROCALCITON <0.10    Recent Results (from the past 240 hours)  Resp panel by RT-PCR (RSV, Flu A&B, Covid) Anterior Nasal Swab     Status: None   Collection Time: 11/21/23 12:35 PM   Specimen: Anterior Nasal Swab  Result Value Ref Range Status   SARS Coronavirus 2 by RT PCR NEGATIVE NEGATIVE Final    Comment: (NOTE) SARS-CoV-2 target nucleic acids are NOT DETECTED.  The SARS-CoV-2 RNA is generally detectable in upper respiratory specimens during the acute phase of infection. The lowest concentration of SARS-CoV-2 viral copies this assay can detect is 138 copies/mL. A negative result does not preclude SARS-Cov-2 infection and should not be used as the sole basis for treatment or other patient management decisions. A negative result may occur with  improper specimen collection/handling, submission of specimen other than nasopharyngeal swab, presence of viral mutation(s) within the areas targeted by this assay, and inadequate number of viral copies(<138 copies/mL). A negative result must be combined with clinical observations, patient  history, and epidemiological information. The expected result is Negative.  Fact Sheet for Patients:  bloggercourse.com  Fact Sheet for Healthcare Providers:  seriousbroker.it  This test is no t yet approved or cleared by the [place]  FDA and  has been authorized for detection and/or diagnosis of SARS-CoV-2 by FDA under an Emergency Use Authorization (EUA). This EUA will remain  in effect (meaning this test can be used) for the duration of the COVID-19 declaration under Section 564(b)(1) of the Act, 21 U.S.C.section 360bbb-3(b)(1), unless the authorization is terminated  or revoked sooner.       Influenza A by PCR NEGATIVE NEGATIVE Final   Influenza B by PCR NEGATIVE NEGATIVE Final    Comment: (NOTE) The Xpert Xpress SARS-CoV-2/FLU/RSV plus assay is intended as an aid in the diagnosis of influenza from Nasopharyngeal swab specimens and should not be used as a sole basis for treatment. Nasal washings and aspirates  are unacceptable for Xpert Xpress SARS-CoV-2/FLU/RSV testing.  Fact Sheet for Patients: bloggercourse.com  Fact Sheet for Healthcare Providers: seriousbroker.it  This test is not yet approved or cleared by the [place]  FDA and has been authorized for detection and/or diagnosis of SARS-CoV-2 by FDA under an Emergency Use Authorization (EUA). This EUA will remain in effect (meaning this test can be used) for the duration of the COVID-19 declaration under Section 564(b)(1) of the Act, 21 U.S.C. section 360bbb-3(b)(1), unless the authorization is terminated or revoked.     Resp Syncytial Virus by PCR NEGATIVE NEGATIVE Final    Comment: (NOTE) Fact Sheet for Patients: bloggercourse.com  Fact Sheet for Healthcare Providers: seriousbroker.it  This test is not yet approved or cleared by the [place]  FDA  and has been authorized for detection and/or diagnosis of SARS-CoV-2 by FDA under an Emergency Use Authorization (EUA). This EUA will remain in effect (meaning this test can be used) for the duration of the COVID-19 declaration under Section 564(b)(1) of the Act, 21 U.S.C. section 360bbb-3(b)(1), unless the authorization is terminated or revoked.  Performed at [facility], [address] [place], [place] [address]   MRSA Next Gen by PCR, Nasal     Status: Abnormal   Collection Time: 11/23/23  8:15 AM   Specimen: Nasal Mucosa; Nasal Swab  Result Value Ref Range Status   MRSA by PCR Next Gen DETECTED (A) NOT DETECTED Final    Comment: RESULT CALLED TO, READ BACK BY AND VERIFIED WITH: MARINDA ADAM, RN 1020 11/23/23 GM (NOTE) The GeneXpert MRSA Assay (FDA approved for NASAL specimens only), is one component of a comprehensive MRSA colonization surveillance program. It is not intended to diagnose MRSA infection nor to guide or monitor treatment for MRSA infections. Test performance is not FDA approved in patients less than 28 years old. Performed at [facility], [address] [place], [place] [address]   Respiratory (~20 pathogens) panel by PCR     Status: None   Collection Time: 11/23/23  8:15 AM   Specimen: Sputum; Respiratory  Result Value Ref Range Status   Adenovirus NOT DETECTED NOT DETECTED Final   Coronavirus 229E NOT DETECTED NOT DETECTED Final    Comment: (NOTE) The Coronavirus on the Respiratory Panel, DOES NOT test for the novel  Coronavirus (2019 nCoV)    Coronavirus HKU1 NOT DETECTED NOT DETECTED Final   Coronavirus NL63 NOT DETECTED NOT DETECTED Final   Coronavirus OC43 NOT DETECTED NOT DETECTED Final   Metapneumovirus NOT DETECTED NOT DETECTED Final   Rhinovirus / Enterovirus NOT DETECTED NOT DETECTED Final   Influenza A NOT DETECTED NOT DETECTED Final   Influenza B NOT DETECTED NOT DETECTED Final   Parainfluenza Virus 1 NOT DETECTED  NOT DETECTED Final   Parainfluenza Virus 2 NOT DETECTED NOT DETECTED Final   Parainfluenza Virus 3 NOT DETECTED NOT DETECTED Final   Parainfluenza Virus 4 NOT DETECTED NOT DETECTED Final   Respiratory Syncytial Virus NOT DETECTED NOT DETECTED Final   Bordetella pertussis NOT DETECTED NOT DETECTED Final   Bordetella Parapertussis NOT DETECTED NOT DETECTED Final   Chlamydophila pneumoniae NOT DETECTED NOT DETECTED Final   Mycoplasma pneumoniae NOT DETECTED NOT DETECTED Final    Comment: Performed at [facility], [address], [place], [place] [address]         Radiology Studies: No results found.      Scheduled Meds:  aspirin  EC  81 mg Oral Daily   budesonide-glycopyrrolate -formoterol  2  puff Inhalation BID   doxycycline   100 mg Oral Q12H   empagliflozin   10 mg Oral QAC breakfast   enoxaparin  (LOVENOX ) injection  40 mg Subcutaneous QHS   furosemide   40 mg Oral Daily   guaiFENesin   600 mg Oral BID   hydrALAZINE   100 mg Oral BID   insulin  aspart  0-15 Units Subcutaneous TID WC   levothyroxine   200 mcg Oral Q0600   loratadine  10 mg Oral Daily   metoprolol  succinate  25 mg Oral Daily   mirtazapine  7.5 mg Oral QHS   predniSONE  40 mg Oral Q breakfast   Followed by   NOREEN ON 11/29/2023] predniSONE  35 mg Oral Q breakfast   Followed by   NOREEN ON 11/30/2023] predniSONE  30 mg Oral Q breakfast   Followed by   NOREEN ON 12/01/2023] predniSONE  25 mg Oral Q breakfast   Followed by   NOREEN ON 12/02/2023] predniSONE  20 mg Oral Q breakfast   Followed by   NOREEN ON 12/03/2023] predniSONE  15 mg Oral Q breakfast   Followed by   NOREEN ON 12/04/2023] predniSONE  10 mg Oral Q breakfast   Followed by   NOREEN ON 12/05/2023] predniSONE  5 mg Oral Q breakfast   sacubitril -valsartan   1 tablet Oral BID   sodium chloride  flush  3 mL Intravenous Q12H   spironolactone   12.5 mg Oral Daily   Continuous Infusions:   LOS: 6 days       Anthony CHRISTELLA Pouch, MD [facility] Pager [phone]  If 7PM-7AM, please contact night-coverage [email or web address] 11/28/2023, 9:14 AM

## 2023-11-28 NOTE — Plan of Care (Signed)

## 2023-11-29 ENCOUNTER — Inpatient Hospital Stay: Admitting: Family

## 2023-11-29 ENCOUNTER — Observation Stay: Admitting: Family

## 2023-11-29 DIAGNOSIS — I5043 Acute on chronic combined systolic (congestive) and diastolic (congestive) heart failure: Secondary | ICD-10-CM | POA: Diagnosis not present

## 2023-11-29 LAB — BASIC METABOLIC PANEL WITH GFR
Anion gap: 6 (ref 5–15)
BUN: 45 mg/dL — ABNORMAL HIGH (ref 8–23)
CO2: 25 mmol/L (ref 22–32)
Calcium: 8.1 mg/dL — ABNORMAL LOW (ref 8.9–10.3)
Chloride: 109 mmol/L (ref 98–111)
Creatinine, Ser: 1.13 mg/dL (ref 0.61–1.24)
GFR, Estimated: >60 mL/min (ref >60)
Glucose, Bld: 143 mg/dL — ABNORMAL HIGH (ref 70–99)
Potassium: 4.3 mmol/L (ref 3.5–5.1)
Sodium: 140 mmol/L (ref 135–145)

## 2023-11-29 LAB — GLUCOSE, CAPILLARY
Glucose-Capillary: 184 mg/dL — ABNORMAL HIGH (ref 70–99)
Glucose-Capillary: 108 mg/dL — ABNORMAL HIGH (ref 70–99)

## 2023-11-29 MED ORDER — PREDNISONE 5 MG PO TABS: ORAL_TABLET | ORAL | 0 refills | Status: AC

## 2023-11-29 MED ORDER — DOXYCYCLINE HYCLATE 100 MG PO TABS: 100.0000 mg | ORAL_TABLET | Freq: Two times a day (BID) | ORAL | 0 refills | Status: DC

## 2023-11-29 MED ORDER — SACUBITRIL-VALSARTAN 97-103 MG PO TABS: 1.0000 | ORAL_TABLET | Freq: Two times a day (BID) | ORAL | 0 refills | Status: AC

## 2023-11-29 MED ORDER — FUROSEMIDE 40 MG PO TABS: 40.0000 mg | ORAL_TABLET | Freq: Every day | ORAL | 0 refills | Status: AC

## 2023-11-29 MED ORDER — BUDESON-GLYCOPYRROL-FORMOTEROL 160-9-4.8 MCG/ACT IN AERO: 2.0000 | INHALATION_SPRAY | Freq: Two times a day (BID) | RESPIRATORY_TRACT | 0 refills | Status: AC

## 2023-11-29 NOTE — Plan of Care (Signed)
  Problem: Pain Managment: Goal: General experience of comfort will improve and/or be controlled Outcome: Progressing   Problem: Safety: Goal: Ability to remain free from injury will improve Outcome: Progressing

## 2023-11-29 NOTE — TOC Transition Note (Signed)
 Transition of Care [facility]) - Discharge Note   Patient Details  Name: Reeve Turnley [name] MRN: [id number] Date of Birth: 20-Feb-1945  Transition of Care [facility]) CM/SW Contact:  Racheal LITTIE Schimke, RN Phone Number: 11/29/2023, 4:29 PM   Clinical Narrative: To discharge home with [facility] wound care for 2 visits, family to provide wound care for remaining 3 days.      Final next level of care: Home w Home Health Services Barriers to Discharge: Barriers Resolved   Patient Goals and CMS Choice Patient states their goals for this hospitalization and ongoing recovery are:: Home with [facility] for wound care   Choice offered to / list presented to : Patient      Discharge Placement                  Name of family member notified: Spouse and Daughter Patient and family notified of of transfer: 11/29/23  Discharge Plan and Services Additional resources added to the After Visit Summary for       Post Acute Care Choice: NA          DME Arranged: N/A DME Agency: NA       HH Arranged: RN HH Agency: [facility] Date [facility] Agency Contacted: 11/28/23   Representative spoke with at [facility] Agency: Joane  Social Drivers of Health (SDOH) Interventions SDOH Screenings   Food Insecurity: No Food Insecurity (11/21/2023)  Housing: Low Risk  (11/21/2023)  Transportation Needs: No Transportation Needs (11/21/2023)  Utilities: Not At Risk (11/21/2023)  Alcohol Screen: Low Risk  (01/22/2022)  Depression (PHQ2-9): Low Risk  (11/01/2023)  Financial Resource Strain: Patient Declined (04/26/2023)   Received from [facility]  Recent Concern: Financial Resource Strain - Medium Risk (02/24/2023)   Received from [facility]  Physical Activity: Unknown (07/26/2022)  Social Connections: Moderately Integrated (11/21/2023)  Stress: Stress Concern Present (07/26/2022)  Tobacco Use: Medium Risk (11/21/2023)     Readmission Risk Interventions    11/23/2023   11:46 AM  10/01/2022    9:51 AM 08/29/2022   10:57 AM  Readmission Risk Prevention Plan  Transportation Screening Complete Complete Complete  PCP or Specialist Appt within 3-5 Days  Complete   HRI or Home Care Consult  Patient refused   Social Work Consult for Recovery Care Planning/Counseling  Complete   Palliative Care Screening  Not Applicable   Medication Review Ocean[facility]) Complete Complete Complete  PCP or Specialist appointment within 3-5 days of discharge Complete  Complete  HRI or Home Care Consult   Complete  SW Recovery Care/Counseling Consult Complete  Complete  Palliative Care Screening Not Applicable  Not Applicable  Skilled Nursing Facility Not Applicable  Complete

## 2023-11-29 NOTE — Progress Notes (Signed)
 Mobility Specialist Progress Note:    11/29/23 1015  Mobility  Activity Refused and notified nurse if applicable   Pt refused mobility, no specific reason.   Sherrilee Ditty Mobility Specialist Please contact via Special Educational Needs Teacher or  Rehab office at [phone]

## 2023-11-29 NOTE — Plan of Care (Signed)
  Problem: Education: Goal: Ability to describe self-care measures that may prevent or decrease complications (Diabetes Survival Skills Education) will improve Outcome: Adequate for Discharge Goal: Individualized Educational Video(s) Outcome: Adequate for Discharge   Problem: Coping: Goal: Ability to adjust to condition or change in health will improve Outcome: Adequate for Discharge   Problem: Fluid Volume: Goal: Ability to maintain a balanced intake and output will improve Outcome: Adequate for Discharge   Problem: Health Behavior/Discharge Planning: Goal: Ability to identify and utilize available resources and services will improve Outcome: Adequate for Discharge Goal: Ability to manage health-related needs will improve Outcome: Adequate for Discharge   Problem: Metabolic: Goal: Ability to maintain appropriate glucose levels will improve Outcome: Adequate for Discharge   Problem: Nutritional: Goal: Maintenance of adequate nutrition will improve Outcome: Adequate for Discharge Goal: Progress toward achieving an optimal weight will improve Outcome: Adequate for Discharge   Problem: Skin Integrity: Goal: Risk for impaired skin integrity will decrease Outcome: Adequate for Discharge   Problem: Tissue Perfusion: Goal: Adequacy of tissue perfusion will improve Outcome: Adequate for Discharge   Problem: Education: Goal: Knowledge of General Education information will improve Description: Including pain rating scale, medication(s)/side effects and non-pharmacologic comfort measures Outcome: Adequate for Discharge   Problem: Health Behavior/Discharge Planning: Goal: Ability to manage health-related needs will improve Outcome: Adequate for Discharge   Problem: Clinical Measurements: Goal: Ability to maintain clinical measurements within normal limits will improve Outcome: Adequate for Discharge Goal: Will remain free from infection Outcome: Adequate for Discharge Goal:  Diagnostic test results will improve Outcome: Adequate for Discharge Goal: Respiratory complications will improve Outcome: Adequate for Discharge Goal: Cardiovascular complication will be avoided Outcome: Adequate for Discharge   Problem: Activity: Goal: Risk for activity intolerance will decrease Outcome: Adequate for Discharge   Problem: Nutrition: Goal: Adequate nutrition will be maintained Outcome: Adequate for Discharge   Problem: Coping: Goal: Level of anxiety will decrease Outcome: Adequate for Discharge   Problem: Elimination: Goal: Will not experience complications related to bowel motility Outcome: Adequate for Discharge Goal: Will not experience complications related to urinary retention Outcome: Adequate for Discharge   Problem: Pain Managment: Goal: General experience of comfort will improve and/or be controlled Outcome: Adequate for Discharge   Problem: Safety: Goal: Ability to remain free from injury will improve Outcome: Adequate for Discharge   Problem: Skin Integrity: Goal: Risk for impaired skin integrity will decrease Outcome: Adequate for Discharge   Problem: Education: Goal: Ability to demonstrate management of disease process will improve Outcome: Adequate for Discharge Goal: Ability to verbalize understanding of medication therapies will improve Outcome: Adequate for Discharge Goal: Individualized Educational Video(s) Outcome: Adequate for Discharge   Problem: Activity: Goal: Capacity to carry out activities will improve Outcome: Adequate for Discharge   Problem: Cardiac: Goal: Ability to achieve and maintain adequate cardiopulmonary perfusion will improve Outcome: Adequate for Discharge

## 2023-11-29 NOTE — Discharge Summary (Signed)
 Physician Discharge Summary  Paul Mahar [name] FMW:[id number] DOB: October 22, 1945 DOA: 11/21/2023  PCP: Cyrus Selinda Moose, PA-C  Admit date: 11/21/2023 Discharge date: 11/29/2023  Admitted From: home  Disposition:  home   Recommendations for Outpatient Follow-up:  Follow up with PCP in 1-2 weeks F/u w/ pulmon, Dr. Aleskerov, within 1 month  F/u w/ cardio in 1-2 weeks   Home Health: yes Equipment/Devices:  Discharge Condition: stable CODE STATUS: full  Diet recommendation: Heart Healthy / Carb Modified  Brief/Interim Summary: HPI was taken from Dr. Laurita: Paul Nadara Browner [name] is a 78 y.o. male with medical history significant of chronic combined HFrEF and HFpEF, CLL, HTN, HLD, IIDM, hypothyroidism, CKD stage IIIa, BPH, SDH, OSA not on CPAP, chronic pain and narcotic dependence, COPD Gold stage I?  Presented with worsening of cough wheezing shortness of breath.   Symptoms started about 2 weeks ago, patient started to have a productive cough with whitish sputum production, and wheezing and increasing exertional dyspnea.  Was prescribed with 5 days course of Augmentin , however despite taking antibiotics his symptoms did not significantly improved.  Denied any chest pain no fever or chills.  Last few days he also found significant orthopnea.  He does weigh himself and found his weight has been  fluctuating but denied any ankle swelling.   ED Course: Afebrile, blood pressure elevated 160/85 ultrafiltration 99% on room air.  Chest x-ray showed interstitial edema, blood work showed BUN 18 creatinine 1.0 potassium 4.0 glucose 103 bicarb 22, WBC 10.6, hemoglobin 9.9.   Patient was given IV Lasix  60 mg x 1 in the ED.   Discharge Diagnoses:  Principal Problem:   CHF (congestive heart failure) (HCC) Active Problems:   Acute on chronic combined systolic and diastolic CHF (congestive heart failure) (HCC)  Acute on chronic combined CHF exacerbation: continue on po lasix . Monitor  I/Os. Continue on metoprolol , aldactone , entresto , hydralazine . F/u w/ cardio outpatient in 1-2 weeks    COPD exacerbation: completed azithromycin course. Continue on steroid taper, doxy, & bronchodilators. Encourage incentive spirometry. D/c home steroid taper & breztri as per pulmon. Will need to outpatient w/ Dr. Aleskerov within 1 month. Pulmon signed off 11/25/23   Left buttocks abscess: s/p bedside I&D of left buttocks abscess as per gen surg.  Continue w/ wound care ( pack wound x 3/5 days as per gen surg). Continue on doxy.    HTN: continue on home dose of entresto , metoprolol , aldactone , hydralazine     H/o CLL: management per onco outpatient    Hypothyroidism: continue on levothyroxine     DM2: well controlled, HbA1c 5.5. Restart home dose of metformin  at d/c    Hx of OSA: does not use CPAP at home. CPAP at bedtime prn    Discharge Instructions  Discharge Instructions     Diet - low sodium heart healthy   Complete by: As directed    Diet Carb Modified   Complete by: As directed    Discharge instructions   Complete by: As directed    F/u w/ PCP in 1-2 weeks. F/u w/ pulmon, Dr. Aleskerov, within 1 month. F/u w/ cardio in 1-2 weeks   Discharge wound care:   Complete by: As directed    Patient should pack wound for 3 days more & then can let heal on its own as per gen surg   Increase activity slowly   Complete by: As directed       Allergies as of 11/29/2023   No Known Allergies  Medication List     STOP taking these medications    amoxicillin -clavulanate 875-125 MG tablet Commonly known as: AUGMENTIN    Brukinsa  80 MG capsule Generic drug: zanubrutinib    fluticasone -salmeterol 250-50 MCG/ACT Aepb Commonly known as: ADVAIR   ipratropium 0.03 % nasal spray Commonly known as: ATROVENT       TAKE these medications    acetaminophen  325 MG tablet Commonly known as: TYLENOL  Take 2 tablets (650 mg total) by mouth every 6 (six) hours as needed for mild  pain (pain score 1-3) or fever.   albuterol  108 (90 Base) MCG/ACT inhaler Commonly known as: VENTOLIN  HFA Inhale 2 puffs into the lungs every 4 (four) hours as needed for wheezing or shortness of breath.   aspirin  EC 81 MG tablet Take 1 tablet (81 mg total) by mouth daily. Swallow whole.   budesonide-glycopyrrolate -formoterol 160-9-4.8 MCG/ACT Aero inhaler Commonly known as: BREZTRI Inhale 2 puffs into the lungs 2 (two) times daily.   doxycycline  100 MG tablet Commonly known as: VIBRA -TABS Take 1 tablet (100 mg total) by mouth every 12 (twelve) hours for 3 days.   empagliflozin  10 MG Tabs tablet Commonly known as: Jardiance  Take 1 tablet (10 mg total) by mouth daily before breakfast.   furosemide  40 MG tablet Commonly known as: LASIX  Take 1 tablet (40 mg total) by mouth daily. Start taking on: November 30, 2023 What changed:  medication strength how much to take when to take this reasons to take this   hydrALAZINE  50 MG tablet Commonly known as: APRESOLINE  Take 2 tablets (100 mg total) by mouth 2 (two) times daily.   levothyroxine  200 MCG tablet Commonly known as: SYNTHROID  Take 1 tablet (200 mcg total) by mouth daily before breakfast.   metFORMIN  500 MG tablet Commonly known as: GLUCOPHAGE  Take 2 tablets (1,000 mg total) by mouth 2 (two) times daily with a meal.   metoprolol  succinate 25 MG 24 hr tablet Commonly known as: Toprol  XL Take 1 tablet (25 mg total) by mouth daily.   mirtazapine 7.5 MG tablet Commonly known as: REMERON Take 7.5 mg by mouth at bedtime.   NYQUIL COLD & FLU PO Take by mouth as needed.   oxyCODONE -acetaminophen  10-325 MG tablet Commonly known as: Percocet Take 1 tablet by mouth every 8 (eight) hours as needed for pain. Must last 30 days.   predniSONE 5 MG tablet Commonly known as: DELTASONE Take 6 tablets (30 mg total) by mouth daily with breakfast for 1 day, THEN 5 tablets (25 mg total) daily with breakfast for 1 day, THEN 4 tablets  (20 mg total) daily with breakfast for 1 day, THEN 3 tablets (15 mg total) daily with breakfast for 1 day, THEN 2 tablets (10 mg total) daily with breakfast for 1 day, THEN 1 tablet (5 mg total) daily with breakfast for 1 day. Start taking on: November 30, 2023   rosuvastatin  40 MG tablet Commonly known as: CRESTOR  Take 1 tablet (40 mg total) by mouth daily.   sacubitril -valsartan  97-103 MG Commonly known as: ENTRESTO  Take 1 tablet by mouth 2 (two) times daily.   spironolactone  25 MG tablet Commonly known as: ALDACTONE  Take 0.5 tablets (12.5 mg total) by mouth daily.               Discharge Care Instructions  (From admission, onward)           Start     Ordered   11/29/23 0000  Discharge wound care:       Comments: Patient  should pack wound for 3 days more & then can let heal on its own as per gen surg   11/29/23 1349            Follow-up Information     [facility]. Go on 12/07/2023.   Specialty: Cardiology Why: Hospital Follow-Up 12/07/2023 @ 8:30 AM Please bring all your medications to follow-up appoinment [facility], [address] Second Floor Free Valet Parking at the [employer] information: [address] [place]  [address] [phone]        Cyrus Mayo Hestle, PA-C Follow up in 1 week(s).   Specialty: Family Medicine Contact information: [address] [place] [address] [phone]         Parris Manna, MD Follow up in 1 week(s).   Specialty: Pulmonary Disease Contact information: [address] [place] [address] [phone]                No Known Allergies  Consultations: Gen surg  Pulmon    Procedures/Studies: US  PELVIS LIMITED (TRANSABDOMINAL ONLY) Result Date: 11/25/2023 CLINICAL DATA:  Left buttock nodule EXAM: LIMITED ULTRASOUND OF PELVIS TECHNIQUE: Limited transabdominal ultrasound examination of  the pelvis was performed. COMPARISON:  09/15/2023 FINDINGS: Sonographic evaluation of the palpable area in the left gluteal region was performed. There is subcutaneous edema within the area of concern, with a 0.8 cm fluid collection within the subcutaneous fat. No other sonographic abnormalities. IMPRESSION: 1. Subcutaneous edema within the area of concern, with 0.8 cm fluid collection as above. This could reflect cellulitis with small subcutaneous abscess. Electronically Signed   By: Ozell Daring M.D.   On: 11/25/2023 20:49   ECHOCARDIOGRAM COMPLETE Result Date: 11/22/2023    ECHOCARDIOGRAM REPORT   Patient Name:   Paul Waymire [name] Date of Exam: 11/22/2023 Medical Rec #:  [id number]                 Height:       73.0 in Accession #:    [id number]                Weight:       198.6 lb Date of Birth:  04/09/1945                  BSA:          2.145 m Patient Age:    78 years                  BP:           148/72 mmHg Patient Gender: M                         HR:           66 bpm. Exam Location:  [facility] Procedure: 2D Echo, Cardiac Doppler and Color Doppler (Both Spectral and Color            Flow Doppler were utilized during procedure). Indications:     CHF-Acute Systolic I50.21  History:         Patient has prior history of Echocardiogram examinations, most                  recent 08/05/2022. CHF.  Sonographer:     Rosina Dunk Referring Phys:  [id number] CORT ONEIDA MANA Diagnosing Phys: Lonni Hanson MD IMPRESSIONS  1. Left ventricular ejection fraction, by estimation, is 35  to 40%. The left ventricle has moderately decreased function. The left ventricle demonstrates global hypokinesis with akinesis of the inferior wall. The left ventricular internal cavity size was moderately dilated. There is moderate left ventricular hypertrophy. Left ventricular diastolic parameters are consistent with Grade II diastolic dysfunction (pseudonormalization). Elevated left atrial pressure.  2. Right ventricular  systolic function is mildly reduced. The right ventricular size is mildly enlarged.  3. Left atrial size was severely dilated.  4. A small pericardial effusion is present. The pericardial effusion is circumferential.  5. The mitral valve is degenerative. Mild to moderate mitral valve regurgitation. No evidence of mitral stenosis.  6. The aortic valve is tricuspid. There is mild thickening of the aortic valve. Aortic valve regurgitation is mild.  7. Aortic dilatation noted. There is mild dilatation of the aortic root, measuring 44 mm.  8. Right atrial size was mildly dilated. FINDINGS  Left Ventricle: Left ventricular ejection fraction, by estimation, is 35 to 40%. The left ventricle has moderately decreased function. The left ventricle demonstrates global hypokinesis with akinesis of the inferior wall. The left ventricular internal cavity size was moderately dilated. There is moderate left ventricular hypertrophy. Left ventricular diastolic parameters are consistent with Grade II diastolic dysfunction (pseudonormalization). Elevated left atrial pressure. Right Ventricle: The right ventricular size is mildly enlarged. No increase in right ventricular wall thickness. Right ventricular systolic function is mildly reduced. Left Atrium: Left atrial size was severely dilated. Right Atrium: Right atrial size was mildly dilated. Pericardium: A small pericardial effusion is present. The pericardial effusion is circumferential. Mitral Valve: The mitral valve is degenerative in appearance. There is mild thickening of the mitral valve leaflet(s). Mild to moderate mitral valve regurgitation. No evidence of mitral valve stenosis. MV peak gradient, 5.2 mmHg. The mean mitral valve gradient is 3.0 mmHg. Tricuspid Valve: The tricuspid valve is normal in structure. Tricuspid valve regurgitation is mild. Aortic Valve: The aortic valve is tricuspid. There is mild thickening of the aortic valve. Aortic valve regurgitation is mild. Aortic  regurgitation PHT measures 428 msec. Aortic valve mean gradient measures 4.0 mmHg. Aortic valve peak gradient measures 7.2 mmHg. Aortic valve area, by VTI measures 3.92 cm. Pulmonic Valve: The pulmonic valve was normal in structure. Pulmonic valve regurgitation is not visualized. No evidence of pulmonic stenosis. Aorta: Aortic dilatation noted. There is mild dilatation of the aortic root, measuring 44 mm. IAS/Shunts: No atrial level shunt detected by color flow Doppler. Additional Comments: There is a small pleural effusion in the left lateral region.  LEFT VENTRICLE PLAX 2D LVIDd:         6.25 cm      Diastology LVIDs:         5.05 cm      LV e' medial:    4.79 cm/s LV PW:         1.75 cm      LV E/e' medial:  21.9 LV IVS:        1.40 cm      LV e' lateral:   5.87 cm/s LVOT diam:     2.70 cm      LV E/e' lateral: 17.9 LV SV:         109 LV SV Index:   51 LVOT Area:     5.73 cm  LV Volumes (MOD) LV vol d, MOD A2C: 235.0 ml LV vol d, MOD A4C: 210.0 ml LV vol s, MOD A2C: 109.0 ml LV vol s, MOD A4C: 153.0 ml LV SV MOD A2C:  126.0 ml LV SV MOD A4C:     210.0 ml LV SV MOD BP:      109.4 ml RIGHT VENTRICLE             IVC RV Basal diam:  5.80 cm     IVC diam: 2.60 cm RV Mid diam:    4.60 cm RV S prime:     10.60 cm/s TAPSE (M-mode): 1.5 cm LEFT ATRIUM              Index        RIGHT ATRIUM           Index LA diam:        4.90 cm  2.28 cm/m   RA Area:     23.80 cm LA Vol (A2C):   191.0 ml 89.03 ml/m  RA Volume:   74.50 ml  34.73 ml/m LA Vol (A4C):   145.0 ml 67.59 ml/m LA Biplane Vol: 174.0 ml 81.10 ml/m  AORTIC VALVE                    PULMONIC VALVE AV Area (Vmax):    3.94 cm     PV Vmax:        0.95 m/s AV Area (Vmean):   3.64 cm     PV Vmean:       68.600 cm/s AV Area (VTI):     3.92 cm     PV VTI:         0.230 m AV Vmax:           134.00 cm/s  PV Peak grad:   3.6 mmHg AV Vmean:          89.600 cm/s  PV Mean grad:   2.0 mmHg AV VTI:            0.279 m      RVOT Peak grad: 2 mmHg AV Peak Grad:      7.2  mmHg AV Mean Grad:      4.0 mmHg LVOT Vmax:         92.10 cm/s LVOT Vmean:        57.000 cm/s LVOT VTI:          0.191 m LVOT/AV VTI ratio: 0.68 AI PHT:            428 msec  AORTA Ao Root diam: 4.40 cm Ao Asc diam:  3.50 cm MITRAL VALVE MV Area (PHT): 4.39 cm     SHUNTS MV Area VTI:   3.12 cm     Systemic VTI:  0.19 m MV Peak grad:  5.2 mmHg     Systemic Diam: 2.70 cm MV Mean grad:  3.0 mmHg     Pulmonic VTI:  0.164 m MV Vmax:       1.14 m/s MV Vmean:      79.4 cm/s MV Decel Time: 173 msec MV E velocity: 105.00 cm/s MV A velocity: 95.10 cm/s MV E/A ratio:  1.10 Lonni Hanson MD Electronically signed by Lonni Hanson MD Signature Date/Time: 11/22/2023/3:06:33 PM    Final    DG Chest Portable 1 View Result Date: 11/21/2023 EXAM: 1 VIEW(S) XRAY OF THE CHEST 11/21/2023 01:01:44 PM COMPARISON: None available. CLINICAL HISTORY: sob. Shortness of breath FINDINGS: LUNGS AND PLEURA: Retrocardiac opacification of the left lower lobe. Mild pulmonary edema. Trace bilateral pleural effusions. No pneumothorax. HEART AND MEDIASTINUM: Cardiomegaly. Atherosclerotic plaque. BONES AND SOFT TISSUES: No acute osseous abnormality. IMPRESSION: 1. Mild interstitial edema  and bilateral pleural effusions concerning for mild chf. 2. Decreased aeration to the left base which may reflect retrocardiac atelectasis or airspace consolidation. Correlate for any signs or symptoms of pneumonia. Electronically signed by: Waddell Calk MD 11/21/2023 02:45 PM EDT RP Workstation: [id number]   CT Chest Wo Contrast Result Date: 11/10/2023 EXAM: CT CHEST WITHOUT CONTRAST 11/09/2023 11:28:35 AM TECHNIQUE: CT of the chest was performed without the administration of intravenous contrast. Multiplanar reformatted images are provided for review. Automated exposure control, iterative reconstruction, and/or weight based adjustment of the mA/kV was utilized to reduce the radiation dose to as low as reasonably achievable. COMPARISON: CT angiogram chest  04/27/2023. CLINICAL HISTORY: Cough that has been going on for a while but worse over last several days with productive component; SOB last several days; hx of leukemia; no surg former smoker quit about 40 years ago. FINDINGS: MEDIASTINUM: Heart is moderately enlarged, unchanged. There is a trace pericardial effusion. There are atherosclerotic calcifications of the aorta. The central airways are clear. LYMPH NODES: There are numerous prominent and nonenlarged axillary lymph nodes. There are additional enlarged left axillary lymph nodes measuring up to 16 mm which have increased in size and number compared to prior. There is a precarinal enlarged lymph node measuring 15 mm which is new from prior. Enlarged subcarinal lymph node measures 15 mm, new from prior. LUNGS AND PLEURA: There are trace bilateral pleural effusions. There are scattered ground glass opacities and peripheral interstitial opacities throughout both lungs. There are some patchy ground-glass opacities in the bilateral lower lobes, left greater than right and minimal patchy airspace opacities in the right middle lobe. There is central peribronchial wall thickening bilaterally. Nodular density seen in the right lower lobe measuring 7 mm image 3/91. There are few more additional scattered right lower lobe 3 mm nodular densities. These are new from prior. No pneumothorax. SOFT TISSUES/BONES: There is diffuse chest wall edema. No acute abnormality of the bones. UPPER ABDOMEN: Right renal cysts are partially visualized. Limited images of the upper abdomen demonstrates no acute abnormality. IMPRESSION: 1. Scattered ground glass and peripheral interstitial opacities throughout both lungs, with patchy ground-glass opacities in the bilateral lower lobes (left greater than right) and minimal patchy airspace opacities in the right middle lobe. Findings are favored as infectious/inflammatory, although edema may be superimposed. 2. New and increased size and number  of enlarged left axillary lymph nodes, measuring up to 16 mm. 3. New precarinal and subcarinal enlarged lymph nodes, each measuring 15 mm. 4. New 7 mm nodular density in the right lower lobe with additional scattered 3 mm nodular densities. Per Fleischner Society Guidelines recommend a non-contrast chest CT at 3-6 months, then consider another non-contrast chest CT at 18-24 months. If patient is low risk for malignancy, non-contrast chest CT at 18-24 months is optional. 5. Trace bilateral pleural effusions. 6. Moderately enlarged heart with trace pericardial effusion, unchanged from prior. Electronically signed by: Greig Pique MD 11/10/2023 08:13 PM EDT RP Workstation: [id number]   (Echo, Carotid, EGD, Colonoscopy, ERCP)    Subjective: Pt c/o fatigue   Discharge Exam: Vitals:   11/29/23 0406 11/29/23 0830  BP: (!) 156/70 (!) 143/87  Pulse: 68 (!) 59  Resp: 16 18  Temp: 98.6 F (37 C) 97.9 F (36.6 C)  SpO2: 96% 96%   Vitals:   11/28/23 1610 11/28/23 2018 11/29/23 0406 11/29/23 0830  BP: 117/60 123/67 (!) 156/70 (!) 143/87  Pulse: 61 68 68 (!) 59  Resp: 17 16 16 18   Temp:  98.1 F (36.7 C) 98.6 F (37 C) 98.6 F (37 C) 97.9 F (36.6 C)  TempSrc: Oral Oral Oral   SpO2: 96% 98% 96% 96%  Weight:      Height:        General: Pt is alert, awake, not in acute distress Cardiovascular:  S1/S2 +, no rubs, no gallops Respiratory: decreased breath sounds b/l  Abdominal: Soft, NT, ND, bowel sounds + Extremities:  no cyanosis    The results of significant diagnostics from this hospitalization (including imaging, microbiology, ancillary and laboratory) are listed below for reference.     Microbiology: Recent Results (from the past 240 hours)  Resp panel by RT-PCR (RSV, Flu A&B, Covid) Anterior Nasal Swab     Status: None   Collection Time: 11/21/23 12:35 PM   Specimen: Anterior Nasal Swab  Result Value Ref Range Status   SARS Coronavirus 2 by RT PCR NEGATIVE NEGATIVE Final     Comment: (NOTE) SARS-CoV-2 target nucleic acids are NOT DETECTED.  The SARS-CoV-2 RNA is generally detectable in upper respiratory specimens during the acute phase of infection. The lowest concentration of SARS-CoV-2 viral copies this assay can detect is 138 copies/mL. A negative result does not preclude SARS-Cov-2 infection and should not be used as the sole basis for treatment or other patient management decisions. A negative result may occur with  improper specimen collection/handling, submission of specimen other than nasopharyngeal swab, presence of viral mutation(s) within the areas targeted by this assay, and inadequate number of viral copies(<138 copies/mL). A negative result must be combined with clinical observations, patient history, and epidemiological information. The expected result is Negative.  Fact Sheet for Patients:  bloggercourse.com  Fact Sheet for Healthcare Providers:  seriousbroker.it  This test is no t yet approved or cleared by the [place]  FDA and  has been authorized for detection and/or diagnosis of SARS-CoV-2 by FDA under an Emergency Use Authorization (EUA). This EUA will remain  in effect (meaning this test can be used) for the duration of the COVID-19 declaration under Section 564(b)(1) of the Act, 21 U.S.C.section 360bbb-3(b)(1), unless the authorization is terminated  or revoked sooner.       Influenza A by PCR NEGATIVE NEGATIVE Final   Influenza B by PCR NEGATIVE NEGATIVE Final    Comment: (NOTE) The Xpert Xpress SARS-CoV-2/FLU/RSV plus assay is intended as an aid in the diagnosis of influenza from Nasopharyngeal swab specimens and should not be used as a sole basis for treatment. Nasal washings and aspirates are unacceptable for Xpert Xpress SARS-CoV-2/FLU/RSV testing.  Fact Sheet for Patients: bloggercourse.com  Fact Sheet for Healthcare  Providers: seriousbroker.it  This test is not yet approved or cleared by the [place]  FDA and has been authorized for detection and/or diagnosis of SARS-CoV-2 by FDA under an Emergency Use Authorization (EUA). This EUA will remain in effect (meaning this test can be used) for the duration of the COVID-19 declaration under Section 564(b)(1) of the Act, 21 U.S.C. section 360bbb-3(b)(1), unless the authorization is terminated or revoked.     Resp Syncytial Virus by PCR NEGATIVE NEGATIVE Final    Comment: (NOTE) Fact Sheet for Patients: bloggercourse.com  Fact Sheet for Healthcare Providers: seriousbroker.it  This test is not yet approved or cleared by the [place]  FDA and has been authorized for detection and/or diagnosis of SARS-CoV-2 by FDA under an Emergency Use Authorization (EUA). This EUA will remain in effect (meaning this test can be used) for the duration of the COVID-19 declaration under  Section 564(b)(1) of the Act, 21 U.S.C. section 360bbb-3(b)(1), unless the authorization is terminated or revoked.  Performed at [facility], [address] [place], [place] [address]   MRSA Next Gen by PCR, Nasal     Status: Abnormal   Collection Time: 11/23/23  8:15 AM   Specimen: Nasal Mucosa; Nasal Swab  Result Value Ref Range Status   MRSA by PCR Next Gen DETECTED (A) NOT DETECTED Final    Comment: RESULT CALLED TO, READ BACK BY AND VERIFIED WITH: MARINDA ADAM, RN 1020 11/23/23 GM (NOTE) The GeneXpert MRSA Assay (FDA approved for NASAL specimens only), is one component of a comprehensive MRSA colonization surveillance program. It is not intended to diagnose MRSA infection nor to guide or monitor treatment for MRSA infections. Test performance is not FDA approved in patients less than 80 years old. Performed at [facility], [address] [place], [place] [address]    Respiratory (~20 pathogens) panel by PCR     Status: None   Collection Time: 11/23/23  8:15 AM   Specimen: Sputum; Respiratory  Result Value Ref Range Status   Adenovirus NOT DETECTED NOT DETECTED Final   Coronavirus 229E NOT DETECTED NOT DETECTED Final    Comment: (NOTE) The Coronavirus on the Respiratory Panel, DOES NOT test for the novel  Coronavirus (2019 nCoV)    Coronavirus HKU1 NOT DETECTED NOT DETECTED Final   Coronavirus NL63 NOT DETECTED NOT DETECTED Final   Coronavirus OC43 NOT DETECTED NOT DETECTED Final   Metapneumovirus NOT DETECTED NOT DETECTED Final   Rhinovirus / Enterovirus NOT DETECTED NOT DETECTED Final   Influenza A NOT DETECTED NOT DETECTED Final   Influenza B NOT DETECTED NOT DETECTED Final   Parainfluenza Virus 1 NOT DETECTED NOT DETECTED Final   Parainfluenza Virus 2 NOT DETECTED NOT DETECTED Final   Parainfluenza Virus 3 NOT DETECTED NOT DETECTED Final   Parainfluenza Virus 4 NOT DETECTED NOT DETECTED Final   Respiratory Syncytial Virus NOT DETECTED NOT DETECTED Final   Bordetella pertussis NOT DETECTED NOT DETECTED Final   Bordetella Parapertussis NOT DETECTED NOT DETECTED Final   Chlamydophila pneumoniae NOT DETECTED NOT DETECTED Final   Mycoplasma pneumoniae NOT DETECTED NOT DETECTED Final    Comment: Performed at [facility], [address], [place], [place] [address]     Labs: BNP (last 3 results) Recent Labs    06/23/23 1741 11/21/23 1235 11/22/23 1607  BNP 2,392.6* 1,856.6* 1,857.1*   Basic Metabolic Panel: Recent Labs  Lab 11/23/23 0517 11/24/23 0515 11/25/23 0227 11/26/23 0548 11/27/23 0449 11/28/23 0543 11/29/23 0547  NA 143 142 142 142 140 140 140  K 3.6 3.7 3.5 3.7 3.7 3.8 4.3  CL 107 108 106 105 105 106 109  CO2 25 25 26 27 26 26 25   GLUCOSE 126* 115* 120* 123* 136* 117* 143*  BUN 22 25* 34* 39* 40* 41* 45*  CREATININE 0.99 1.11 1.10 1.15 1.28* 1.17 1.13  CALCIUM  8.3* 8.3* 8.6* 8.3* 8.0* 7.9* 8.1*  MG 2.3 2.3 2.2  2.2 2.5* 2.9*  --   PHOS 2.2*  --   --   --   --   --   --    Liver Function Tests: No results for input(s): AST, ALT, ALKPHOS, BILITOT, PROT, ALBUMIN in the last 168 hours. No results for input(s): LIPASE, AMYLASE in the last 168 hours. No results for input(s): AMMONIA in the last 168 hours. CBC: Recent Labs  Lab 11/24/23 0515 11/25/23 0227  11/26/23 0548 11/27/23 0449 11/28/23 0543  WBC 14.7* 17.7* 16.3* 17.1* 16.9*  HGB 10.5* 11.1* 10.4* 10.4* 10.3*  HCT 33.9* 34.9* 33.4* 33.6* 32.4*  MCV 91.9 89.3 89.3 90.8 89.8  PLT 243 292 272 274 253   Cardiac Enzymes: Recent Labs  Lab 11/23/23 0517  CKTOTAL 151   BNP: Invalid input(s): POCBNP CBG: Recent Labs  Lab 11/28/23 1413 11/28/23 1646 11/28/23 2148 11/29/23 0832 11/29/23 1208  GLUCAP 144* 222* 242* 184* 108*   D-Dimer No results for input(s): DDIMER in the last 72 hours. Hgb A1c No results for input(s): HGBA1C in the last 72 hours. Lipid Profile No results for input(s): CHOL, HDL, LDLCALC, TRIG, CHOLHDL, LDLDIRECT in the last 72 hours. Thyroid  function studies No results for input(s): TSH, T4TOTAL, T3FREE, THYROIDAB in the last 72 hours.  Invalid input(s): FREET3 Anemia work up No results for input(s): VITAMINB12, FOLATE, FERRITIN, TIBC, IRON , RETICCTPCT in the last 72 hours. Urinalysis    Component Value Date/Time   COLORURINE YELLOW (A) 10/01/2023 0129   APPEARANCEUR CLEAR (A) 10/01/2023 0129   APPEARANCEUR Clear 10/04/2019 1128   LABSPEC 1.024 10/01/2023 0129   LABSPEC 1.019 06/16/2013 1607   PHURINE 5.0 10/01/2023 0129   GLUCOSEU >=500 (A) 10/01/2023 0129   GLUCOSEU Negative 06/16/2013 1607   HGBUR SMALL (A) 10/01/2023 0129   BILIRUBINUR NEGATIVE 10/01/2023 0129   BILIRUBINUR Negative 07/24/2020 1013   BILIRUBINUR Negative 10/04/2019 1128   BILIRUBINUR Negative 06/16/2013 1607   KETONESUR NEGATIVE 10/01/2023 0129   PROTEINUR 30 (A)  10/01/2023 0129   UROBILINOGEN 0.2 07/24/2020 1013   NITRITE NEGATIVE 10/01/2023 0129   LEUKOCYTESUR NEGATIVE 10/01/2023 0129   LEUKOCYTESUR Negative 06/16/2013 1607   Sepsis Labs Recent Labs  Lab 11/25/23 0227 11/26/23 0548 11/27/23 0449 11/28/23 0543  WBC 17.7* 16.3* 17.1* 16.9*   Microbiology Recent Results (from the past 240 hours)  Resp panel by RT-PCR (RSV, Flu A&B, Covid) Anterior Nasal Swab     Status: None   Collection Time: 11/21/23 12:35 PM   Specimen: Anterior Nasal Swab  Result Value Ref Range Status   SARS Coronavirus 2 by RT PCR NEGATIVE NEGATIVE Final    Comment: (NOTE) SARS-CoV-2 target nucleic acids are NOT DETECTED.  The SARS-CoV-2 RNA is generally detectable in upper respiratory specimens during the acute phase of infection. The lowest concentration of SARS-CoV-2 viral copies this assay can detect is 138 copies/mL. A negative result does not preclude SARS-Cov-2 infection and should not be used as the sole basis for treatment or other patient management decisions. A negative result may occur with  improper specimen collection/handling, submission of specimen other than nasopharyngeal swab, presence of viral mutation(s) within the areas targeted by this assay, and inadequate number of viral copies(<138 copies/mL). A negative result must be combined with clinical observations, patient history, and epidemiological information. The expected result is Negative.  Fact Sheet for Patients:  bloggercourse.com  Fact Sheet for Healthcare Providers:  seriousbroker.it  This test is no t yet approved or cleared by the [place]  FDA and  has been authorized for detection and/or diagnosis of SARS-CoV-2 by FDA under an Emergency Use Authorization (EUA). This EUA will remain  in effect (meaning this test can be used) for the duration of the COVID-19 declaration under Section 564(b)(1) of the Act, 21 U.S.C.section  360bbb-3(b)(1), unless the authorization is terminated  or revoked sooner.       Influenza A by PCR NEGATIVE NEGATIVE Final   Influenza B by PCR NEGATIVE NEGATIVE Final  Comment: (NOTE) The Xpert Xpress SARS-CoV-2/FLU/RSV plus assay is intended as an aid in the diagnosis of influenza from Nasopharyngeal swab specimens and should not be used as a sole basis for treatment. Nasal washings and aspirates are unacceptable for Xpert Xpress SARS-CoV-2/FLU/RSV testing.  Fact Sheet for Patients: bloggercourse.com  Fact Sheet for Healthcare Providers: seriousbroker.it  This test is not yet approved or cleared by the [place]  FDA and has been authorized for detection and/or diagnosis of SARS-CoV-2 by FDA under an Emergency Use Authorization (EUA). This EUA will remain in effect (meaning this test can be used) for the duration of the COVID-19 declaration under Section 564(b)(1) of the Act, 21 U.S.C. section 360bbb-3(b)(1), unless the authorization is terminated or revoked.     Resp Syncytial Virus by PCR NEGATIVE NEGATIVE Final    Comment: (NOTE) Fact Sheet for Patients: bloggercourse.com  Fact Sheet for Healthcare Providers: seriousbroker.it  This test is not yet approved or cleared by the [place]  FDA and has been authorized for detection and/or diagnosis of SARS-CoV-2 by FDA under an Emergency Use Authorization (EUA). This EUA will remain in effect (meaning this test can be used) for the duration of the COVID-19 declaration under Section 564(b)(1) of the Act, 21 U.S.C. section 360bbb-3(b)(1), unless the authorization is terminated or revoked.  Performed at [facility], [address] [place], [place] [address]   MRSA Next Gen by PCR, Nasal     Status: Abnormal   Collection Time: 11/23/23  8:15 AM   Specimen: Nasal Mucosa; Nasal Swab  Result Value Ref Range  Status   MRSA by PCR Next Gen DETECTED (A) NOT DETECTED Final    Comment: RESULT CALLED TO, READ BACK BY AND VERIFIED WITH: MARINDA ADAM, RN 1020 11/23/23 GM (NOTE) The GeneXpert MRSA Assay (FDA approved for NASAL specimens only), is one component of a comprehensive MRSA colonization surveillance program. It is not intended to diagnose MRSA infection nor to guide or monitor treatment for MRSA infections. Test performance is not FDA approved in patients less than 25 years old. Performed at [facility], [address] [place], [place] [address]   Respiratory (~20 pathogens) panel by PCR     Status: None   Collection Time: 11/23/23  8:15 AM   Specimen: Sputum; Respiratory  Result Value Ref Range Status   Adenovirus NOT DETECTED NOT DETECTED Final   Coronavirus 229E NOT DETECTED NOT DETECTED Final    Comment: (NOTE) The Coronavirus on the Respiratory Panel, DOES NOT test for the novel  Coronavirus (2019 nCoV)    Coronavirus HKU1 NOT DETECTED NOT DETECTED Final   Coronavirus NL63 NOT DETECTED NOT DETECTED Final   Coronavirus OC43 NOT DETECTED NOT DETECTED Final   Metapneumovirus NOT DETECTED NOT DETECTED Final   Rhinovirus / Enterovirus NOT DETECTED NOT DETECTED Final   Influenza A NOT DETECTED NOT DETECTED Final   Influenza B NOT DETECTED NOT DETECTED Final   Parainfluenza Virus 1 NOT DETECTED NOT DETECTED Final   Parainfluenza Virus 2 NOT DETECTED NOT DETECTED Final   Parainfluenza Virus 3 NOT DETECTED NOT DETECTED Final   Parainfluenza Virus 4 NOT DETECTED NOT DETECTED Final   Respiratory Syncytial Virus NOT DETECTED NOT DETECTED Final   Bordetella pertussis NOT DETECTED NOT DETECTED Final   Bordetella Parapertussis NOT DETECTED NOT DETECTED Final   Chlamydophila pneumoniae NOT DETECTED NOT DETECTED Final   Mycoplasma pneumoniae NOT DETECTED NOT DETECTED Final    Comment: Performed at [facility], [address]  [place], [place] [address]     Time  coordinating discharge: 37 minutes  SIGNED:   Anthony CHRISTELLA Pouch, MD  [facility] 11/29/2023, 1:49 PM Pager   If 7PM-7AM, please contact night-coverage [email or web address]

## 2023-11-29 NOTE — TOC Progression Note (Signed)
 Transition of Care (TOC) - Progression Note    Patient Details  Name: Paul Streater [name] MRN: [id number] Date of Birth: 07-05-1945  Transition of Care [facility]) CM/SW Contact  Racheal LITTIE Schimke, RN Phone Number: 11/29/2023, 2:22 PM  Clinical Narrative: Spoke with patient about [facility] service for wound care, to be done x2 days and the need to have family to cover the other 3. Patient consents, says his wife and Son from [place] will come to learn how to do wound care, however he can also do it. Patient advised to make sure family can change wound to avoid infection.  CM spoke will daughter who request that [facility] calls her to schedule visits. Bayda, Corey call given daughter name and number to call.      Expected Discharge Plan: Home/Self Care Barriers to Discharge: Continued Medical Work up               Expected Discharge Plan and Services     Post Acute Care Choice: NA Living arrangements for the past 2 months: Single Family Home Expected Discharge Date: 11/29/23                                     Social Drivers of Health (SDOH) Interventions SDOH Screenings   Food Insecurity: No Food Insecurity (11/21/2023)  Housing: Low Risk  (11/21/2023)  Transportation Needs: No Transportation Needs (11/21/2023)  Utilities: Not At Risk (11/21/2023)  Alcohol Screen: Low Risk  (01/22/2022)  Depression (PHQ2-9): Low Risk  (11/01/2023)  Financial Resource Strain: Patient Declined (04/26/2023)   Received from [facility]  Recent Concern: Financial Resource Strain - Medium Risk (02/24/2023)   Received from [facility]  Physical Activity: Unknown (07/26/2022)  Social Connections: Moderately Integrated (11/21/2023)  Stress: Stress Concern Present (07/26/2022)  Tobacco Use: Medium Risk (11/21/2023)    Readmission Risk Interventions    11/23/2023   11:46 AM 10/01/2022    9:51 AM 08/29/2022   10:57 AM  Readmission Risk Prevention Plan  Transportation  Screening Complete Complete Complete  PCP or Specialist Appt within 3-5 Days  Complete   HRI or Home Care Consult  Patient refused   Social Work Consult for Recovery Care Planning/Counseling  Complete   Palliative Care Screening  Not Applicable   Medication Review Oceanographer) Complete Complete Complete  PCP or Specialist appointment within 3-5 days of discharge Complete  Complete  HRI or Home Care Consult   Complete  SW Recovery Care/Counseling Consult Complete  Complete  Palliative Care Screening Not Applicable  Not Applicable  Skilled Nursing Facility Not Applicable  Complete

## 2023-11-30 ENCOUNTER — Other Ambulatory Visit: Payer: Self-pay

## 2023-12-01 ENCOUNTER — Other Ambulatory Visit: Payer: Self-pay | Admitting: Oncology

## 2023-12-01 ENCOUNTER — Other Ambulatory Visit: Payer: Self-pay

## 2023-12-01 DIAGNOSIS — C9110 Chronic lymphocytic leukemia of B-cell type not having achieved remission: Secondary | ICD-10-CM

## 2023-12-02 ENCOUNTER — Encounter: Payer: Self-pay | Admitting: Oncology

## 2023-12-02 ENCOUNTER — Telehealth: Payer: Self-pay

## 2023-12-02 ENCOUNTER — Other Ambulatory Visit: Payer: Self-pay

## 2023-12-02 DIAGNOSIS — C9110 Chronic lymphocytic leukemia of B-cell type not having achieved remission: Secondary | ICD-10-CM

## 2023-12-02 MED ORDER — ZANUBRUTINIB 160 MG PO TABS
160.0000 mg | ORAL_TABLET | Freq: Two times a day (BID) | ORAL | 0 refills | Status: DC
  Filled 2023-12-02 – 2023-12-05 (×3): qty 60, 30d supply, fill #0

## 2023-12-02 NOTE — Telephone Encounter (Addendum)
 Pt has been discharged from hospital. Please schedule follow up in 1-2 weeks and notify pt of appt.  Lab/MD

## 2023-12-05 ENCOUNTER — Encounter: Payer: Self-pay | Admitting: Nurse Practitioner

## 2023-12-05 ENCOUNTER — Other Ambulatory Visit: Payer: Self-pay

## 2023-12-05 ENCOUNTER — Ambulatory Visit: Attending: Nurse Practitioner | Admitting: Nurse Practitioner

## 2023-12-05 VITALS — BP 159/79 | HR 69 | Temp 98.4°F | Ht 73.0 in | Wt 216.0 lb

## 2023-12-05 DIAGNOSIS — M12812 Other specific arthropathies, not elsewhere classified, left shoulder: Secondary | ICD-10-CM | POA: Diagnosis present

## 2023-12-05 DIAGNOSIS — M25512 Pain in left shoulder: Secondary | ICD-10-CM | POA: Insufficient documentation

## 2023-12-05 DIAGNOSIS — G894 Chronic pain syndrome: Secondary | ICD-10-CM | POA: Insufficient documentation

## 2023-12-05 DIAGNOSIS — G8929 Other chronic pain: Secondary | ICD-10-CM | POA: Insufficient documentation

## 2023-12-05 DIAGNOSIS — M47816 Spondylosis without myelopathy or radiculopathy, lumbar region: Secondary | ICD-10-CM | POA: Insufficient documentation

## 2023-12-05 DIAGNOSIS — M5412 Radiculopathy, cervical region: Secondary | ICD-10-CM | POA: Diagnosis present

## 2023-12-05 DIAGNOSIS — M19011 Primary osteoarthritis, right shoulder: Secondary | ICD-10-CM | POA: Insufficient documentation

## 2023-12-05 DIAGNOSIS — Z79899 Other long term (current) drug therapy: Secondary | ICD-10-CM | POA: Diagnosis present

## 2023-12-05 DIAGNOSIS — M25511 Pain in right shoulder: Secondary | ICD-10-CM | POA: Diagnosis present

## 2023-12-05 DIAGNOSIS — M75102 Unspecified rotator cuff tear or rupture of left shoulder, not specified as traumatic: Secondary | ICD-10-CM | POA: Insufficient documentation

## 2023-12-05 MED ORDER — NALOXONE HCL 4 MG/0.1ML NA LIQD: 1.0000 | NASAL | 1 refills | Status: AC | PRN

## 2023-12-05 MED ORDER — OXYCODONE-ACETAMINOPHEN 10-325 MG PO TABS: 1.0000 | ORAL_TABLET | Freq: Three times a day (TID) | ORAL | 0 refills | Status: AC | PRN

## 2023-12-05 MED ORDER — OXYCODONE-ACETAMINOPHEN 10-325 MG PO TABS: 1.0000 | ORAL_TABLET | Freq: Three times a day (TID) | ORAL | 0 refills | Status: DC | PRN

## 2023-12-05 NOTE — Progress Notes (Signed)
 Safety precautions to be maintained throughout the outpatient stay will include: orient to surroundings, keep bed in low position, maintain call bell within reach at all times, provide assistance with transfer out of bed and ambulation.   Nursing Pain Medication Assessment:  Safety precautions to be maintained throughout the outpatient stay will include: orient to surroundings, keep bed in low position, maintain call bell within reach at all times, provide assistance with transfer out of bed and ambulation.  Medication Inspection Compliance: Pill count conducted under aseptic conditions, in front of the patient. Neither the pills nor the bottle was removed from the patient's sight at any time. Once count was completed pills were immediately returned to the patient in their original bottle.  Medication: Oxycodone /APAP Pill/Patch Count: 0 of 90 pills/patches remain Pill/Patch Appearance: Markings consistent with prescribed medication Bottle Appearance: Standard pharmacy container. Clearly labeled. Filled Date: ? Date rubbed off of bottle / 23 / 2025 Last Medication intake:  Today

## 2023-12-05 NOTE — Progress Notes (Signed)
 PROVIDER NOTE: Interpretation of information contained herein should be left to medically-trained personnel. Specific patient instructions are provided elsewhere under Patient Instructions section of medical record. This document was created in part using AI and STT-dictation technology, any transcriptional errors that may result from this process are unintentional.  Patient: Paul Nadara Browner [name]  Service: E/M   PCP: Cyrus Selinda Moose, PA-C  DOB: [birth year]  DOS: 12/05/2023  Provider: Emmy MARLA Blanch, NP  MRN: [id number]  Delivery: Face-to-face  Specialty: Interventional Pain Management  Type: Established Patient  Setting: Ambulatory outpatient facility  Specialty designation: 09  Referring Prov.: Cyrus Selinda Moose,*  Location: Outpatient office facility       History of present illness (HPI) [name], a 78 y.o. year old male, is here today because of his low back pain and bilateral shoulder pain. Paul Briggs primary complain today is Back Pain (Lower back ; bilateral shoulders)  Pertinent problems: Paul Briggs  has Chronic back pain; Spinal stenosis, lumbar region, with neurogenic claudication; Chronic left shoulder pain; and Chronic pain syndrome on their pertinent problem list.  Pain Assessment: Severity of Chronic pain is reported as a 5 /10. Location: Back Lower/radiates down back of both legs to bottoms of both feet. Onset: More than a month ago. Quality: Aching. Timing: Intermittent. Modifying factor(s): sitting down, meds. Vitals:  height is 6' 1 (1.854 m) and weight is 216 lb (98 kg). His temperature is 98.4 F (36.9 C). His blood pressure is 159/79 (abnormal) and his pulse is 69. His oxygen  saturation is 100%.  BMI: Estimated body mass index is 28.5 kg/m as calculated from the following:   Height as of this encounter: 6' 1 (1.854 m).   Weight as of this encounter: 216 lb (98 kg).  Last encounter: 09/12/2023. Last procedure: Visit date not  found.  Reason for encounter: medication management. No change in medical history since last visit.  Patient's pain is at baseline.  Patient continues multimodal pain regimen as prescribed.  States that it provides pain relief and improvement in functional status.   Discussed the use of AI scribe software for clinical note transcription with the patient, who gave verbal consent to proceed.  History of Present Illness   Paul Torr [name] is a 78 year old male who presents with lower back and shoulder pain.  He experiences significant pain primarily in his lower back and shoulders, with radiation down to his foot. The pain has persisted for over three months and is severe enough to cause difficulty in lifting his head and affects his ability to walk and sit still. He also has numbness in his feet, which temporarily subsides with physical activity.  He recalls undergoing an x-ray at the hospital about three months ago and was told something about a spasm, but he is uncertain about the details or any subsequent actions taken. He is frustrated over the lack of follow-up regarding his neck pain.  He is currently taking Percocet and OxyContin  for pain management, with a prescribed frequency of three times a day. However, he occasionally takes more due to the severity of his pain.  He mentions a limitation on his water intake to 32 ounces per day due to heart failure, which impacts his ability to provide a urine sample for testing.     Pharmacotherapy Assessment   Oxycodone -acetaminophen  (Percocet) 10-325 mg tablet every 8 hours as needed for pain. MME=45 Monitoring: [place] PMP: PDMP reviewed during this encounter.  Pharmacotherapy: No side-effects or adverse reactions reported. Compliance: No problems identified. Effectiveness: Clinically acceptable.  Paul Nathanel PARAS, RN  12/05/2023  8:56 AM  Sign when Signing Visit Safety precautions to be maintained throughout the outpatient stay will  include: orient to surroundings, keep bed in low position, maintain call bell within reach at all times, provide assistance with transfer out of bed and ambulation.   Nursing Pain Medication Assessment:  Safety precautions to be maintained throughout the outpatient stay will include: orient to surroundings, keep bed in low position, maintain call bell within reach at all times, provide assistance with transfer out of bed and ambulation.  Medication Inspection Compliance: Pill count conducted under aseptic conditions, in front of the patient. Neither the pills nor the bottle was removed from the patient's sight at any time. Once count was completed pills were immediately returned to the patient in their original bottle.  Medication: Oxycodone /APAP Pill/Patch Count: 0 of 90 pills/patches remain Pill/Patch Appearance: Markings consistent with prescribed medication Bottle Appearance: Standard pharmacy container. Clearly labeled. Filled Date: ? Date rubbed off of bottle / 23 / 2025 Last Medication intake:  Today    UDS:  Summary  Date Value Ref Range Status  12/21/2022 FINAL  Final    Comment:    ==================================================================== [employer] Select 13 (MW) ==================================================================== Test                             Result       Flag       Units  Drug Present and Declared for Prescription Verification   Oxycodone                       555          EXPECTED   ng/mg creat   Oxymorphone                    308          EXPECTED   ng/mg creat   Noroxycodone                   1967         EXPECTED   ng/mg creat   Noroxymorphone                 113          EXPECTED   ng/mg creat    Sources of oxycodone  are scheduled prescription medications.    Oxymorphone, noroxycodone, and noroxymorphone are expected    metabolites of oxycodone . Oxymorphone is also available as a    scheduled prescription  medication.  ==================================================================== Test                      Result    Flag   Units      Ref Range   Creatinine              119              mg/dL      >=79 ==================================================================== Declared Medications:  The flagging and interpretation on this report are based on the  following declared medications.  Unexpected results may arise from  inaccuracies in the declared medications.   **Note: The testing scope of this panel includes these medications:   Oxycodone  (Percocet)   **Note: The testing scope of this panel does not include the  following reported medications:  Acetaminophen  (Percocet)  Aspirin   Dapagliflozin  (Farxiga )  Doxycycline   Hydralazine  (Apresoline )  Levothyroxine  (Synthroid )  Metformin  (Glucophage )  Methocarbamol  (Robaxin )  Metoprolol  (Toprol )  Ondansetron  (Zofran )  Potassium (Klor-Con )  Pregabalin  (Lyrica )  Rosuvastatin  (Crestor )  Sacubitril  (Entresto )  Sildenafil  (Viagra )  Tamsulosin  (Flomax )  Valsartan  (Entresto )  Vitamin B12  Zanubrutinib  (Brukinsa ) ==================================================================== For clinical consultation, please call [phone]. ====================================================================     No results found for: CBDTHCR No results found for: D8THCCBX No results found for: D9THCCBX  ROS  Constitutional: Denies any fever or chills Gastrointestinal: No reported hemesis, hematochezia, vomiting, or acute GI distress Musculoskeletal: Low back pain, bilateral shoulder pain Neurological: No reported episodes of acute onset apraxia, aphasia, dysarthria, agnosia, amnesia, paralysis, loss of coordination, or loss of consciousness  Medication Review  DM-Doxylamine-Acetaminophen , acetaminophen , albuterol , aspirin  EC, budesonide-glycopyrrolate -formoterol, empagliflozin , furosemide , hydrALAZINE , levothyroxine ,  metFORMIN , metoprolol  succinate, mirtazapine, naloxone, oxyCODONE -acetaminophen , predniSONE, rosuvastatin , sacubitril -valsartan , spironolactone , and zanubrutinib   History Review  Allergy: Mr. Botto has no known allergies. Drug: Mr. Johannes  reports no history of drug use. Alcohol:  reports no history of alcohol use. Tobacco:  reports that he has quit smoking. His smoking use included cigarettes. He has been exposed to tobacco smoke. He has never used smokeless tobacco. Social: Mr. Derner  reports that he has quit smoking. His smoking use included cigarettes. He has been exposed to tobacco smoke. He has never used smokeless tobacco. He reports that he does not drink alcohol and does not use drugs. 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), DDD (degenerative disc disease), cervical, Difficult airway (10/22/2015), Diverticulosis, DM (diabetes mellitus), type 2 (HCC), Dysplasia of prostate, Erectile dysfunction, Frequent falls, Heart failure with mildly reduced ejection fraction (HFmrEF) (HCC), 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 Surgical: Mr. Marinaro  has a past surgical history that includes Back surgery; Tonsillectomy; Esophagogastroduodenoscopy (egd) with propofol  (N/A, 08/21/2022); biopsy (08/21/2022); Colonoscopy with propofol  (N/A, 08/23/2022); Givens capsule study (N/A, 08/23/2022); polypectomy (08/23/2022); Submucosal tattoo injection (08/23/2022); biopsy (08/23/2022); Irrigation and debridement abscess (Left, 08/28/2022); Solmon hole of cranium (Left, 10/22/2015); Laparoscopic right colectomy (Right, 09/28/2022); Ventral hernia repair (N/A, 09/28/2022); Colon surgery (09/28/2022); Colonoscopy with propofol  (N/A, 02/28/2023); Esophagogastroduodenoscopy (egd) with propofol  (N/A, 02/28/2023); biopsy (02/28/2023); Inguinal hernia repair (Right, 03/09/2023); Hernia repair; Colonoscopy with propofol  (N/A, 04/25/2023); polypectomy (04/25/2023); IR US  Guide Bx Asp/Drain (06/27/2023); and IR BONE MARROW BIOPSY & ASPIRATION (08/19/2023). Family: family history includes ADD / ADHD in his daughter; Asthma in his son; Cancer in his maternal aunt.  Laboratory Chemistry Profile   Renal Lab Results  Component Value Date   BUN 45 (H) 11/29/2023   CREATININE 1.13 11/29/2023   BCR 11 12/22/2022   GFRAA >60 10/22/2015   GFRNONAA >60 11/29/2023    Hepatic Lab Results  Component Value Date   AST 36 11/01/2023   ALT 20 11/01/2023  ALBUMIN 3.8 11/01/2023   ALKPHOS 76 11/01/2023   HCVAB NON REACTIVE 11/01/2023   LIPASE 35 11/12/2022    Electrolytes Lab Results  Component Value Date   NA 140 11/29/2023   K 4.3 11/29/2023   CL 109 11/29/2023   CALCIUM  8.1 (L) 11/29/2023   MG 2.9 (H) 11/28/2023   PHOS 2.2 (L) 11/23/2023    Bone Lab Results  Component Value Date   VD25OH 37.52 11/22/2023   TESTOSTERONE  368 11/24/2022    Inflammation (CRP: Acute Phase) (ESR: Chronic Phase) Lab Results  Component Value Date   CRP 0.7 11/26/2023   ESRSEDRATE 37 (H)  06/23/2023   LATICACIDVEN 1.0 10/01/2023         Note: Above Lab results reviewed.  Recent Imaging Review  US  PELVIS LIMITED (TRANSABDOMINAL ONLY) CLINICAL DATA:  Left buttock nodule  EXAM: LIMITED ULTRASOUND OF PELVIS  TECHNIQUE: Limited transabdominal ultrasound examination of the pelvis was performed.  COMPARISON:  09/15/2023  FINDINGS: Sonographic evaluation of the palpable area in the left gluteal region was performed. There is subcutaneous edema within the area of concern, with a 0.8 cm fluid collection within the subcutaneous fat. No other sonographic abnormalities.  IMPRESSION: 1. Subcutaneous edema within the area of concern, with 0.8 cm fluid collection as above. This could reflect cellulitis with small subcutaneous abscess.  Electronically Signed   By: Ozell Daring M.D.   On: 11/25/2023 20:49 Note: Reviewed        Physical Exam  Vitals: BP (!) 159/79   Pulse 69   Temp 98.4 F (36.9 C)   Ht 6' 1 (1.854 m)   Wt 216 lb (98 kg)   SpO2 100%   BMI 28.50 kg/m  BMI: Estimated body mass index is 28.5 kg/m as calculated from the following:   Height as of this encounter: 6' 1 (1.854 m).   Weight as of this encounter: 216 lb (98 kg). Ideal: Ideal body weight: 79.9 kg (176 lb 2.4 oz) Adjusted ideal body weight: 87.1 kg (192 lb 1.4 oz) General appearance: Well nourished, well developed, and well hydrated. In no apparent acute distress Mental status: Alert, oriented x 3 (person, place, & time)       Respiratory: No evidence of acute respiratory distress Eyes: PERLA  Musculoskeletal: +LBP Bilateral shoulder pain (Reduced ROM) Assessment   Diagnosis Status  1. Chronic pain syndrome   2. Lumbar facet arthropathy   3. Chronic right shoulder pain   4. Primary osteoarthritis of right shoulder   5. Cervical radicular pain   6. Left rotator cuff tear arthropathy   7. Chronic left shoulder pain   8. Medication management     Controlled Controlled Controlled   Updated Problems: Problem  Medication Management    Plan of Care  Problem-specific:  Assessment and Plan    Lumbar facet arthropathy Severe pain in lower back and neck affecting mobility. Current OxyContin  regimen sometimes exceeded due to inadequate pain control. Numbness in feet resolves with activity.  - Continue OxyContin  every eight hours. - Emphasized compliance with medication regimen. - Ordered urine drug screen to monitor medication use.  Chronic pain syndrome: Patient's pain is controlled with oxycodone , will continue on current medication regimen.  Prescribing drug monitoring (PMP) reviewed, consistent with the use of prescribed medication and no evidence of narcotic misuse or abuse. Routine UDS ordered today.  No side effects or any adverse reaction reported to medication.  The patient was emphasized compliance with medication regimen.  Schedule follow-up in 90 days  for medication management.  Chronic right shoulder pain: Patient's recent right shoulder x-ray demonstrate degenerative changes involving the glenohumeral radial and acromioclavicular joints.  No acute fracture or dislocation is identified.  The patient continue to manage with oxycodone .  No side effects or any adverse reaction reported to medication..      Mr. Eldar Robitaille [name] has a current medication list which includes the following long-term medication(s): albuterol , furosemide , hydralazine , levothyroxine , metformin , metoprolol  succinate, mirtazapine, rosuvastatin , and spironolactone .  Pharmacotherapy (Medications Ordered): Meds ordered this encounter  Medications   oxyCODONE -acetaminophen  (PERCOCET) 10-325 MG tablet    Sig: Take 1 tablet by mouth every 8 (eight) hours as needed for pain. Must last 30 days.    Dispense:  90 tablet    Refill:  0    Chronic Pain: STOP Act (Not applicable) Fill 1 day early if closed on refill date. Avoid benzodiazepines within 8  hours of opioids   oxyCODONE -acetaminophen  (PERCOCET) 10-325 MG tablet    Sig: Take 1 tablet by mouth every 8 (eight) hours as needed for pain. Must last 30 days.    Dispense:  90 tablet    Refill:  0    Chronic Pain: STOP Act (Not applicable) Fill 1 day early if closed on refill date. Avoid benzodiazepines within 8 hours of opioids   oxyCODONE -acetaminophen  (PERCOCET) 10-325 MG tablet    Sig: Take 1 tablet by mouth every 8 (eight) hours as needed for pain. Must last 30 days.    Dispense:  90 tablet    Refill:  0    Chronic Pain: STOP Act (Not applicable) Fill 1 day early if closed on refill date. Avoid benzodiazepines within 8 hours of opioids   naloxone (NARCAN) nasal spray 4 mg/0.1 mL    Sig: Place 1 spray into the nose as needed for up to 365 doses (for opioid-induced respiratory depresssion). In case of emergency (overdose), spray once into each nostril. If no response within 3 minutes, repeat application and call 911.    Dispense:  1 each    Refill:  1    Instruct patient in proper use of device.   Orders:  Orders Placed This Encounter  Procedures   [employer]ox[employer] Select 13 (MW), Urine    Volume: 30 ml(s). Minimum 3 ml of urine is needed. Document temperature of fresh sample. Indications: Long term (current) use of opiate analgesic (S20.108)    Release to patient:   Immediate        Return in about 3 months (around 03/06/2024) for (F2F), (MM), Emmy Blanch NP.    Recent Visits Date Type Provider Dept  09/12/23 Office Visit Estellar Cadena K, NP [facility]  Showing recent visits within past 90 days and meeting all other requirements Today's Visits Date Type Provider Dept  12/05/23 Office Visit Evelynn Hench K, NP [facility]  Showing today's visits and meeting all other requirements Future Appointments Date Type Provider Dept  02/29/24 Appointment Annia Gomm K, NP [facility]  Showing future appointments within next 90 days and meeting all other  requirements  I discussed the assessment and treatment plan with the patient. The patient was provided an opportunity to ask questions and all were answered. The patient agreed with the plan and demonstrated an understanding of the instructions.  Patient advised to call back or seek an in-person evaluation if the symptoms or condition worsens.  I personally spent a total of 30 minutes in the care of the patient today including preparing to  see the patient, getting/reviewing separately obtained history, performing a medically appropriate exam/evaluation, counseling and educating, placing orders, referring and communicating with other health care professionals, documenting clinical information in the EHR, independently interpreting results, communicating results, and coordinating care.   Note by: Deavin Forst K Lester Platas, NP (TTS and AI technology used. I apologize for any typographical errors that were not detected and corrected.) Date: 12/05/2023; Time: 9:31 AM

## 2023-12-06 ENCOUNTER — Telehealth: Payer: Self-pay | Admitting: Family

## 2023-12-06 ENCOUNTER — Telehealth: Payer: Self-pay

## 2023-12-06 DIAGNOSIS — C9110 Chronic lymphocytic leukemia of B-cell type not having achieved remission: Secondary | ICD-10-CM

## 2023-12-06 NOTE — Telephone Encounter (Signed)
 Per Dr. Babara, Pt can resume Brukinsa  and follow up in 1 week for labs (cbc).   Spoke to pt and he verbalized understanding to resume Brukinsa . Advised him to keep follow up on 12/4 as scheduled.   Please schedule lab on 11/18 @ 10:15, pt aware and will also see appts on mychart.

## 2023-12-06 NOTE — Progress Notes (Unsigned)
 ADVANCED HF CLINIC CONSULT NOTE  Referring Physician: Cyrus Selinda Moose, PA-C Primary Care: Cyrus Selinda Moose, PA-C Primary Cardiologist: Redell Cave, MD  Chief Complaint: HF follow-up   HPI:  Paul Delpriore [name] is a 78 y.o. male  with h/o HFmrEF, NICM, LVH, HTN, nonobstructive CAD, diabetes, CLL s/p chemo, CKD stage 3, IDA who presents for pre-op evaluation.    Diagnsed with CLL in 12/23   The patient was initially seen 05/2022 for HTN and he was started on Diovan  160mg  daily. EKG showed LBBB and an echo was ordered. Echo showed LVEF 45-50%, no WMA, moderate to severe LVH with speckled appearance, consider for amyloidosis, severely dilated left atrium, mildly dilated right atrium, small pericardial effusion with no tamponade, mild MR. Plan was for cardiac CTA and cardiac MRI.  Cardiac CTA showed coronary calcium  score of 14.3, 12th percentile for age and sex matched control, minimal proximal LAD stenosis, overall minimal nonobstructive CAD.   Patient was admitted August 26, 2022 with a mechanical fall, cellulitis of the left groin/SIRS, and mildly elevated troponin. He was briefly seen by cardiology and felt to be stable. No inpatient cardiac work-up was pursued.    Seen 09/17/22 for pre-op for laparoscopic colectomy 09/28/22. He felt weak since being discharged from the hospital, but was overall stable from a cardiac perspective. He was encouraged to get the cardiac MRI done.   cMRI 3/25 EF 43% Mod-sev basal septal hypertrophy (16mm) RVEF 48% No LGE Mild MR .Findings suggest non ischemic cardiomyopathy. Possible inflammatory disease involving the basal lateral wall.  Follows with Onc for his CLL. Zanubrutinib  was stopped due to anemia. CT chest 04/27/23 No PE. No mention of sarcoid   Admitted 06/23/23 with right knee pain/ swelling. Treated with antibiotics for septic prepatellar bursitis or right knee.   ED visit 07/08/23 with right knee / right shoulder pain after  being hit by a cement slide at work which knocked him on the ground. Knee xray and head / neck CT negative.   2 ED visits 08/25 due to abscesses.   ED visit 10/01/23 for leg cellulitis.   Admitted 11/21/23 with worsening of cough wheezing shortness of breath for the last 2 weeks. IV diuresed due to HF exacerbation. Echo 11/22/23: EF 35-40%, moderate LVH, G2DD, mildly reduced RV, severely dilated LA, mild/ moderate MR, mild dilatation of the aortic root, measuring 44 mm. Treated with antibiotics for COPD exacerbation.    He presents today with a chief complaint of a HF visit. Specifically denies any shortness of breath, fatigue, chest pain, dizziness, edema or difficulty sleeping. Had a sleep study done 04/17/20 and says that he has CPAP at home but does not wear it. Says that he sleeps like a baby. Right eye is red today as he says that while he was mowing the yard yesterday, something hit him in the eye. Denies any visual issues or pain in that eye.   He's initially unclear about how much fluids he drinks but when I reviewed 4 water bottles of fluid daily, he says that he's probably drinking that much. Planning to go pour concrete today as he's helping remodel a house. Denies tobacco, alcohol, drug use.   Did not bring his medications with him and has no idea of what he's taking.   ROS: All systems negative except what is listed in HPI, PMH and Problem List  Past Medical History:  Diagnosis Date   Adenoma of left adrenal gland    Anemia  Aortic atherosclerosis    Blood transfusion without reported diagnosis    BPH (benign prostatic hyperplasia)    Bradycardia    CAD (coronary artery disease) 08/26/2022   a.) cCTA 08/26/2022: Ca2+ 14.3 (12th %'ile for age/sex.race match control); (<25%) pLAD   Cellulitis of left thigh 08/2022   Chronic pain syndrome    a.) on COT managed by pain management   Chronic, continuous use of opioids    a.) chronic pain syndrome/chronic back pain; managed by  pain management   CKD (chronic kidney disease) stage 3, GFR 30-59 ml/min (HCC)    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   a.) Rai stage III   DDD (degenerative disc disease), cervical    Difficult airway 10/22/2015   a.) 1st attempt with glidescope  --> macroglossic (unsuccessful); b.) 2nd attempt with Mac 4 and direct laryngoscopy with a 8.0 ETT --> tube too large to pass through the cords; b.) 3rd attempt (successful) with a Mac 4 and a 7.5 tube   Diverticulosis    DM (diabetes mellitus), type 2 (HCC)    Dysplasia of prostate    Erectile dysfunction    Frequent falls    Heart failure with mildly reduced ejection fraction (HFmrEF) (HCC)    a.) TTE 10/23/2015: EF 60-65%, mod-sev LVH, mild biatrial dil, degen MV disease, AoV sclerosis, asc Ao 38 mm, G1DD; b.) TTE 08/05/2022: EF 45-50%, mod-sev LVH (speckled pattern), sev biatrial dil, mild MR, AoV sclerosis, G1DD   Hepatic flexure mass 08/23/2022   a.) colonoscopy 08/23/2022: 14 x 15 mm partially obstructing ulcerated distal ascending colon mass; tissue friable --> pathology resulted as tubular adenoma, however felt to be false (-) --> referral to sugery and colectomy recommended; b.) s/p RIGHT hemicolectomy 09/28/2022   History of MRSA infection 08/28/2022   a.) MRSA PCR (+) 08/28/2022; culture from LEFT thigh abscess   HTN (hypertension)    Hyperlipidemia    Hypogonadism in male    Hypothyroidism    IDA (iron  deficiency anemia)    LBBB (left bundle branch block)    Long-term use of aspirin  therapy    Lumbar spinal stenosis    Mild cardiomegaly    Multiple lung nodules on CT    NICM (nonischemic cardiomyopathy) (HCC)    a.) TTE 10/23/2015: EF 60-65; b.) TTE 08/05/2022: EF 45-50%   Orchitis and epididymitis 06/16/2013   OSA on CPAP    Palindromic rheumatism, hand    Pericardial effusion    Prostatitis    Sepsis (HCC) 08/2022   Subdural hematoma (HCC) 10/22/2015   a.) s/p traumatic mechanical fall --> CT head  10/22/2015: high-density SDH the left cerebral convexity with13 mm midline shift --> s/p LEFT frontal burr hole craniotomy   Tubular adenoma of colon    Ventral hernia     Current Outpatient Medications  Medication Sig Dispense Refill   acetaminophen  (TYLENOL ) 325 MG tablet Take 2 tablets (650 mg total) by mouth every 6 (six) hours as needed for mild pain (pain score 1-3) or fever.     albuterol  (VENTOLIN  HFA) 108 (90 Base) MCG/ACT inhaler Inhale 2 puffs into the lungs every 4 (four) hours as needed for wheezing or shortness of breath. 1 each 2   aspirin  EC 81 MG tablet Take 1 tablet (81 mg total) by mouth daily. Swallow whole.     budesonide-glycopyrrolate -formoterol (BREZTRI) 160-9-4.8 MCG/ACT AERO inhaler Inhale 2 puffs into the lungs 2 (two) times daily. 10.7 each 0   DM-Doxylamine-Acetaminophen  (NYQUIL COLD & FLU PO)  Take by mouth as needed.     empagliflozin  (JARDIANCE ) 10 MG TABS tablet Take 1 tablet (10 mg total) by mouth daily before breakfast. 90 tablet 2   furosemide  (LASIX ) 40 MG tablet Take 1 tablet (40 mg total) by mouth daily. 30 tablet 0   hydrALAZINE  (APRESOLINE ) 50 MG tablet Take 2 tablets (100 mg total) by mouth 2 (two) times daily. 180 tablet 3   levothyroxine  (SYNTHROID ) 200 MCG tablet Take 1 tablet (200 mcg total) by mouth daily before breakfast. 90 tablet 0   metFORMIN  (GLUCOPHAGE ) 500 MG tablet Take 2 tablets (1,000 mg total) by mouth 2 (two) times daily with a meal. 120 tablet 0   metoprolol  succinate (TOPROL  XL) 25 MG 24 hr tablet Take 1 tablet (25 mg total) by mouth daily. 90 tablet 1   mirtazapine (REMERON) 7.5 MG tablet Take 7.5 mg by mouth at bedtime.     naloxone (NARCAN) nasal spray 4 mg/0.1 mL Place 1 spray into the nose as needed for up to 365 doses (for opioid-induced respiratory depresssion). In case of emergency (overdose), spray once into each nostril. If no response within 3 minutes, repeat application and call 911. 1 each 1   [START ON 12/22/2023]  oxyCODONE -acetaminophen  (PERCOCET) 10-325 MG tablet Take 1 tablet by mouth every 8 (eight) hours as needed for pain. Must last 30 days. 90 tablet 0   [START ON 01/21/2024] oxyCODONE -acetaminophen  (PERCOCET) 10-325 MG tablet Take 1 tablet by mouth every 8 (eight) hours as needed for pain. Must last 30 days. 90 tablet 0   [START ON 02/20/2024] oxyCODONE -acetaminophen  (PERCOCET) 10-325 MG tablet Take 1 tablet by mouth every 8 (eight) hours as needed for pain. Must last 30 days. 90 tablet 0   predniSONE (DELTASONE) 5 MG tablet Take 6 tablets (30 mg total) by mouth daily with breakfast for 1 day, THEN 5 tablets (25 mg total) daily with breakfast for 1 day, THEN 4 tablets (20 mg total) daily with breakfast for 1 day, THEN 3 tablets (15 mg total) daily with breakfast for 1 day, THEN 2 tablets (10 mg total) daily with breakfast for 1 day, THEN 1 tablet (5 mg total) daily with breakfast for 1 day. 21 tablet 0   rosuvastatin  (CRESTOR ) 40 MG tablet Take 1 tablet (40 mg total) by mouth daily. 90 tablet 1   sacubitril -valsartan  (ENTRESTO ) 97-103 MG Take 1 tablet by mouth 2 (two) times daily. 60 tablet 0   spironolactone  (ALDACTONE ) 25 MG tablet Take 0.5 tablets (12.5 mg total) by mouth daily. 45 tablet 3   zanubrutinib  (BRUKINSA ) 160 MG tablet Take 1 tablet (160 mg total) by mouth 2 (two) times daily. 60 tablet 0   No current facility-administered medications for this visit.    No Known Allergies    Social History   Socioeconomic History   Marital status: Married    Spouse name: Not on file   Number of children: Not on file   Years of education: Not on file   Highest education level: 5th grade  Occupational History   Not on file  Tobacco Use   Smoking status: Former    Types: Cigarettes    Passive exposure: Past   Smokeless tobacco: Never   Tobacco comments:    Stop smoking iver 40  plus years ago  Vaping Use   Vaping status: Never Used  Substance and Sexual Activity   Alcohol use: No   Drug  use: No   Sexual activity: Not Currently  Other Topics Concern  Not on file  Social History Narrative   Not on file   Social Drivers of Health   Financial Resource Strain: Patient Declined (04/26/2023)   Received from [facility]   Overall Financial Resource Strain (CARDIA)    Difficulty of Paying Living Expenses: Patient declined  Recent Concern: Financial Resource Strain - Medium Risk (02/24/2023)   Received from [facility]   Overall Financial Resource Strain (CARDIA)    Difficulty of Paying Living Expenses: Somewhat hard  Food Insecurity: No Food Insecurity (11/21/2023)   Hunger Vital Sign    Worried About Running Out of Food in the Last Year: Never true    Ran Out of Food in the Last Year: Never true  Transportation Needs: No Transportation Needs (11/21/2023)   PRAPARE - Administrator, Civil Service (Medical): No    Lack of Transportation (Non-Medical): No  Physical Activity: Unknown (07/26/2022)   Exercise Vital Sign    Days of Exercise per Week: Patient declined    Minutes of Exercise per Session: Not on file  Stress: Stress Concern Present (07/26/2022)   [employer] of Occupational Health - Occupational Stress Questionnaire    Feeling of Stress : To some extent  Social Connections: Moderately Integrated (11/21/2023)   Social Connection and Isolation Panel    Frequency of Communication with Friends and Family: Three times a week    Frequency of Social Gatherings with Friends and Family: Once a week    Attends Religious Services: More than 4 times per year    Active Member of Clubs or Organizations: No    Attends Banker Meetings: Never    Marital Status: Living with partner  Intimate Partner Violence: Not At Risk (11/21/2023)   Humiliation, Afraid, Rape, and Kick questionnaire    Fear of Current or Ex-Partner: No    Emotionally Abused: No    Physically Abused: No    Sexually Abused: No      Family  History  Problem Relation Age of Onset   Cancer Maternal Aunt    ADD / ADHD Daughter    Asthma Son    Prostate cancer Neg Hx    Kidney disease Neg Hx    Kidney cancer Neg Hx    Bladder Cancer Neg Hx    Vitals:   12/07/23 0850  BP: (!) 149/76  Pulse: (!) 59  SpO2: 98%  Weight: 220 lb (99.8 kg)   Wt Readings from Last 3 Encounters:  12/07/23 220 lb (99.8 kg)  12/05/23 216 lb (98 kg)  11/27/23 198 lb 6.6 oz (90 kg)   Lab Results  Component Value Date   CREATININE 1.13 11/29/2023   CREATININE 1.17 11/28/2023   CREATININE 1.28 (H) 11/27/2023    PHYSICAL EXAM:  General: Well appearing. HOH. Right sclera is red. He's able to open / close the eye without difficulty. Eyelid appears swollen.  Cor: No JVD. Regular rhythm, bradycardic.  Lungs: rhonchi in bilateral lower lobes Abdomen: soft, nontender, nondistended. Extremities: no edema Neuro:. Affect pleasant   ECG: not done   ASSESSMENT & PLAN:  1. Chronic systolic HF due to NICM  - Echo 5/24 LVEF 45-50%, no WMA, moderate to severe LVH, severely dilated left atrium, mildly dilated right atrium, small pericardial effusion with no tamponade, mild MR. - Cardiac CTA CAC 14.3,minimal proximal LAD stenosis, overall minimal nonobstructive CAD. - cMRI 3/25 EF 43% Mod-sev basal septal hypertrophy (16mm) RVEF 48% No LGE Mild MR .Findings suggest non ischemic  cardiomyopathy. Possible inflammatory disease involving the basal lateral wall. - cardiac PET 06/23/23: Pulmonary artery enlargement suggests pulmonary arterial hypertension. - Echo 11/22/23: EF 35-40%, moderate LVH, G2DD, mildly reduced RV, severely dilated LA, mild/ moderate MR, mild dilatation of the aortic root, measuring 44 mm. - most likely etiology is HTN cardiomyopathy  - NYHA I - euvolemic today. Weight down 2 pounds since last clinic visit - Continue jardiance  10mg  daily - Continue furosemide  40mg  daily - Continue hydralazine  100mg  BID - Continue toprol  25mg  daily. HR  today limits titration - Continue Entresto  97/103mg  bid - Begin spironolactone  25mg  daily. BMET in 1 week and again at next visit - Checked when HF meds were picked up last and all HF meds are current. Emphasized bring medication bottles to every visit.   2. HTN - BP 149/76 - Continue hydralazine , toprol , entresto  and beginning spironolactone  today - BMET 11/29/23 reviewed: sodium 140, potassium 4.3, creatinine 1.13, GFR >60 - BMET in 1 week and repeat at next visit   3. CLL - Oncology following  4: OSA - sleep study done 04/17/20 - patient says that he has CPAP at home but doesn't wear it as he sleeps like a baby. Reviewed how untreated sleep apnea can affect his heart and that his EF has declined from last year. Encouraged him to begin wearing it.   5: Right eye injury - he says that he was riding his lawnmower yesterday and something hit his eye. Encouraged him to monitor this closely and follow up with PCP if symptoms worsen   Return in 1 month, sooner if needed.   I spent 35 minutes reviewing records, interviewing/ examing patient and managing plan/ orders.   Ellouise DELENA Class, FNP  4:31 PM

## 2023-12-06 NOTE — Telephone Encounter (Signed)
 Called to confirm/remind patient of their appointment at the [facility] on 12/07/23.   Appointment:   [] Confirmed  [x] Left mess   [] No answer/No voice mail  [] VM Full/unable to leave message  [] Phone not in service  Patient reminded to bring all medications and/or complete list.  Confirmed patient has transportation. Gave directions, instructed to utilize valet parking.

## 2023-12-07 ENCOUNTER — Inpatient Hospital Stay: Admitting: Family

## 2023-12-07 ENCOUNTER — Other Ambulatory Visit: Payer: Self-pay

## 2023-12-07 ENCOUNTER — Encounter: Payer: Self-pay | Admitting: Family

## 2023-12-07 VITALS — BP 149/76 | HR 59 | Wt 220.0 lb

## 2023-12-07 DIAGNOSIS — I5020 Unspecified systolic (congestive) heart failure: Secondary | ICD-10-CM | POA: Diagnosis not present

## 2023-12-07 DIAGNOSIS — I10 Essential (primary) hypertension: Secondary | ICD-10-CM

## 2023-12-07 DIAGNOSIS — C9110 Chronic lymphocytic leukemia of B-cell type not having achieved remission: Secondary | ICD-10-CM | POA: Diagnosis not present

## 2023-12-07 DIAGNOSIS — G4733 Obstructive sleep apnea (adult) (pediatric): Secondary | ICD-10-CM

## 2023-12-07 DIAGNOSIS — S0591XA Unspecified injury of right eye and orbit, initial encounter: Secondary | ICD-10-CM

## 2023-12-07 MED ORDER — SPIRONOLACTONE 25 MG PO TABS: 25.0000 mg | ORAL_TABLET | Freq: Every day | ORAL | 3 refills | Status: AC

## 2023-12-07 NOTE — Progress Notes (Signed)
 Specialty Pharmacy Refill Coordination Note  Paul Cookson. is a 78 y.o. male contacted today regarding refills of specialty medication(s) Zanubrutinib  (BRUKINSA )   Patient requested Delivery   Delivery date: 12/12/23   Verified address: [address], [place] [address]   Medication will be filled on: 12/09/23  Patient is aware of formulation change and that when he starts tablets he should be taking 1 BID.

## 2023-12-07 NOTE — Patient Instructions (Addendum)
 It was good to see you today!  Medication Changes:  START Spironolactone  25mg  (1 whole tab) daily  Lab Work:  Go over to the [facility]. Go pass the gift shop and have your blood work completed IN ONE WEEK.    We will only call you if the results are abnormal or if the provider would like to make medication changes.  No news is good news.   Special Instructions // Education:  PLEASE START WEARING YOUR CPAP.  Follow-Up in: Please follow up with the [facility] in 1 month with Paul Class, FNP.   Thank you for choosing [facility].    At the [facility], you and your health needs are our priority. We have a designated team specialized in the treatment of Heart Failure. This Care Team includes your primary Heart Failure Specialized Cardiologist (physician), Advanced Practice Providers (APPs- Physician Assistants and Nurse Practitioners), and Pharmacist who all work together to provide you with the care you need, when you need it.   You may see any of the following providers on your designated Care Team at your next follow up:  Dr. Toribio Fuel Dr. Ezra Shuck Dr. Ria Commander Dr. Morene Brownie Paul Class, FNP Jaun Bash, RPH-CPP  Please be sure to bring in all your medications bottles to every appointment.   Need to Contact Us :  If you have any questions or concerns before your next appointment please send us  a message through [place] or call our office at [phone].    TO LEAVE A MESSAGE FOR THE NURSE SELECT OPTION 2, PLEASE LEAVE A MESSAGE INCLUDING: YOUR NAME DATE OF BIRTH CALL BACK NUMBER REASON FOR CALL**this is important as we prioritize the call backs  YOU WILL RECEIVE A CALL BACK THE SAME DAY AS LONG AS YOU CALL BEFORE 4:00 PM

## 2023-12-08 ENCOUNTER — Other Ambulatory Visit: Payer: Self-pay

## 2023-12-08 LAB — TOXASSURE SELECT 13 (MW), URINE

## 2023-12-13 ENCOUNTER — Inpatient Hospital Stay: Attending: Oncology

## 2023-12-13 DIAGNOSIS — D509 Iron deficiency anemia, unspecified: Secondary | ICD-10-CM | POA: Diagnosis present

## 2023-12-13 DIAGNOSIS — C9110 Chronic lymphocytic leukemia of B-cell type not having achieved remission: Secondary | ICD-10-CM | POA: Insufficient documentation

## 2023-12-13 LAB — CBC WITH DIFFERENTIAL (CANCER CENTER ONLY)
Abs Immature Granulocytes: 0.04 K/uL (ref 0.00–0.07)
Basophils Absolute: 0.1 K/uL (ref 0.0–0.1)
Basophils Relative: 0 %
Eosinophils Absolute: 0.1 K/uL (ref 0.0–0.5)
Eosinophils Relative: 1 %
HCT: 34.1 % — ABNORMAL LOW (ref 39.0–52.0)
Hemoglobin: 10.4 g/dL — ABNORMAL LOW (ref 13.0–17.0)
Immature Granulocytes: 0 %
Lymphocytes Relative: 72 %
Lymphs Abs: 12.7 K/uL — ABNORMAL HIGH (ref 0.7–4.0)
MCH: 27.7 pg (ref 26.0–34.0)
MCHC: 30.5 g/dL (ref 30.0–36.0)
MCV: 90.9 fL (ref 80.0–100.0)
Monocytes Absolute: 0.8 K/uL (ref 0.1–1.0)
Monocytes Relative: 4 %
Neutro Abs: 4.1 K/uL (ref 1.7–7.7)
Neutrophils Relative %: 23 %
Platelet Count: 138 K/uL — ABNORMAL LOW (ref 150–400)
RBC: 3.75 MIL/uL — ABNORMAL LOW (ref 4.22–5.81)
RDW: 19.6 % — ABNORMAL HIGH (ref 11.5–15.5)
Smear Review: NORMAL
WBC Count: 17.9 K/uL — ABNORMAL HIGH (ref 4.0–10.5)
nRBC: 0 % (ref 0.0–0.2)

## 2023-12-13 LAB — CMP (CANCER CENTER ONLY)
ALT: 18 U/L (ref 0–44)
AST: 25 U/L (ref 15–41)
Albumin: 3.8 g/dL (ref 3.5–5.0)
Alkaline Phosphatase: 69 U/L (ref 38–126)
Anion gap: 10 (ref 5–15)
BUN: 17 mg/dL (ref 8–23)
CO2: 24 mmol/L (ref 22–32)
Calcium: 8.6 mg/dL — ABNORMAL LOW (ref 8.9–10.3)
Chloride: 106 mmol/L (ref 98–111)
Creatinine: 1.39 mg/dL — ABNORMAL HIGH (ref 0.61–1.24)
GFR, Estimated: 52 mL/min — ABNORMAL LOW (ref >60)
Glucose, Bld: 82 mg/dL (ref 70–99)
Potassium: 3.8 mmol/L (ref 3.5–5.1)
Sodium: 140 mmol/L (ref 135–145)
Total Bilirubin: 1 mg/dL (ref 0.0–1.2)
Total Protein: 6.8 g/dL (ref 6.5–8.1)

## 2023-12-13 LAB — LACTATE DEHYDROGENASE: LDH: 173 U/L (ref 105–235)

## 2023-12-13 LAB — SAMPLE TO BLOOD BANK

## 2023-12-23 ENCOUNTER — Other Ambulatory Visit: Payer: Self-pay | Admitting: Internal Medicine

## 2023-12-27 ENCOUNTER — Telehealth: Payer: Self-pay | Admitting: Neurosurgery

## 2023-12-27 NOTE — Telephone Encounter (Signed)
 Patient wants to move forward with cervical surgery before dealing with his shoulder surgery.  Per [facility] patient would like to schedule an appointment, but please send MyChart message as he doesn't answer his phone due to telemarketing calls.  Please advise if an appointment is needed and time frame to schedule.

## 2023-12-27 NOTE — Telephone Encounter (Addendum)
 Katie left a voicemail for him. I also sent a mychart message.

## 2023-12-27 NOTE — Telephone Encounter (Signed)
 Note from Selinda Quan, [place] with [facility] 12/26/23: Chronic bilateral shoulder pain, post-traumatic Severe pain with limited range of motion post-trauma. Previous neck MRI done, shoulder MRI not completed. Reports worsening pain and functional limitations. - Ordered MRI of both shoulders. - Referred to orthopedic specialist for further evaluation and management. - Scheduled follow-up appointment with orthopedic specialist before leaving the clinic.  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 However, his left shoulder pain is most pressing currently. I would like to get an MRI scan of his left shoulder to evaluate for rotator cuff pathology. If that is negative, we will proceed with C3-6 anterior cervical discectomy and fusion.

## 2023-12-28 ENCOUNTER — Other Ambulatory Visit: Payer: Self-pay

## 2023-12-28 DIAGNOSIS — C9110 Chronic lymphocytic leukemia of B-cell type not having achieved remission: Secondary | ICD-10-CM

## 2023-12-28 DIAGNOSIS — I10 Essential (primary) hypertension: Secondary | ICD-10-CM

## 2023-12-29 ENCOUNTER — Inpatient Hospital Stay: Attending: Oncology

## 2023-12-29 ENCOUNTER — Other Ambulatory Visit: Payer: Self-pay

## 2023-12-29 ENCOUNTER — Encounter: Payer: Self-pay | Admitting: Oncology

## 2023-12-29 ENCOUNTER — Other Ambulatory Visit (HOSPITAL_COMMUNITY): Payer: Self-pay

## 2023-12-29 ENCOUNTER — Inpatient Hospital Stay: Admitting: Oncology

## 2023-12-29 VITALS — BP 132/76 | HR 69 | Temp 98.6°F | Resp 20 | Wt 209.5 lb

## 2023-12-29 DIAGNOSIS — D709 Neutropenia, unspecified: Secondary | ICD-10-CM

## 2023-12-29 DIAGNOSIS — Z79899 Other long term (current) drug therapy: Secondary | ICD-10-CM | POA: Diagnosis not present

## 2023-12-29 DIAGNOSIS — C9110 Chronic lymphocytic leukemia of B-cell type not having achieved remission: Secondary | ICD-10-CM | POA: Insufficient documentation

## 2023-12-29 DIAGNOSIS — D509 Iron deficiency anemia, unspecified: Secondary | ICD-10-CM | POA: Insufficient documentation

## 2023-12-29 DIAGNOSIS — Z5111 Encounter for antineoplastic chemotherapy: Secondary | ICD-10-CM | POA: Diagnosis not present

## 2023-12-29 LAB — CMP (CANCER CENTER ONLY)
ALT: 18 U/L (ref 0–44)
AST: 34 U/L (ref 15–41)
Albumin: 4.2 g/dL (ref 3.5–5.0)
Alkaline Phosphatase: 79 U/L (ref 38–126)
Anion gap: 9 (ref 5–15)
BUN: 25 mg/dL — ABNORMAL HIGH (ref 8–23)
CO2: 28 mmol/L (ref 22–32)
Calcium: 9.6 mg/dL (ref 8.9–10.3)
Chloride: 106 mmol/L (ref 98–111)
Creatinine: 1.48 mg/dL — ABNORMAL HIGH (ref 0.61–1.24)
GFR, Estimated: 48 mL/min — ABNORMAL LOW (ref >60)
Glucose, Bld: 116 mg/dL — ABNORMAL HIGH (ref 70–99)
Potassium: 4.9 mmol/L (ref 3.5–5.1)
Sodium: 142 mmol/L (ref 135–145)
Total Bilirubin: 0.5 mg/dL (ref 0.0–1.2)
Total Protein: 6.7 g/dL (ref 6.5–8.1)

## 2023-12-29 LAB — CBC WITH DIFFERENTIAL (CANCER CENTER ONLY)
Abs Immature Granulocytes: 0.03 K/uL (ref 0.00–0.07)
Basophils Absolute: 0.1 K/uL (ref 0.0–0.1)
Basophils Relative: 0 %
Eosinophils Absolute: 0.1 K/uL (ref 0.0–0.5)
Eosinophils Relative: 1 %
HCT: 32.1 % — ABNORMAL LOW (ref 39.0–52.0)
Hemoglobin: 10.1 g/dL — ABNORMAL LOW (ref 13.0–17.0)
Immature Granulocytes: 0 %
Lymphocytes Relative: 81 %
Lymphs Abs: 11.4 K/uL — ABNORMAL HIGH (ref 0.7–4.0)
MCH: 27.9 pg (ref 26.0–34.0)
MCHC: 31.5 g/dL (ref 30.0–36.0)
MCV: 88.7 fL (ref 80.0–100.0)
Monocytes Absolute: 0.5 K/uL (ref 0.1–1.0)
Monocytes Relative: 4 %
Neutro Abs: 1.9 K/uL (ref 1.7–7.7)
Neutrophils Relative %: 14 %
Platelet Count: 166 K/uL (ref 150–400)
RBC: 3.62 MIL/uL — ABNORMAL LOW (ref 4.22–5.81)
RDW: 18.6 % — ABNORMAL HIGH (ref 11.5–15.5)
Smear Review: NORMAL
WBC Count: 14 K/uL — ABNORMAL HIGH (ref 4.0–10.5)
nRBC: 0 % (ref 0.0–0.2)

## 2023-12-29 LAB — SAMPLE TO BLOOD BANK

## 2023-12-29 LAB — LACTATE DEHYDROGENASE: LDH: 214 U/L (ref 105–235)

## 2023-12-29 MED ORDER — ZANUBRUTINIB 160 MG PO TABS
160.0000 mg | ORAL_TABLET | Freq: Two times a day (BID) | ORAL | 0 refills | Status: DC
  Filled 2023-12-29 – 2024-01-09 (×2): qty 60, 30d supply, fill #0

## 2023-12-29 NOTE — Assessment & Plan Note (Signed)
 Low neutrophil counts. Improved.

## 2023-12-29 NOTE — Progress Notes (Signed)
 Hematology/Oncology Progress note [phone] [phone]        ASSESSMENT & PLAN:   Cancer Staging  CLL (chronic lymphocytic leukemia) (HCC) Staging form: Chronic Lymphocytic Leukemia / Small Lymphocytic Lymphoma, AJCC 8th Edition - Clinical stage from 12/28/2021: Modified Rai Stage III (Modified Rai risk: High, Lymphocytosis: Present, Adenopathy: Present, Organomegaly: Absent, Anemia: Present, Thrombocytopenia: Absent) - Signed by Babara Call, MD on 01/20/2022   CLL (chronic lymphocytic leukemia) [facility]) Labs are reviewed and discussed with patient. CT chest abdomen pelvis results reviewed and discussed with patient.   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  50% of bone marrow is involved with CLL.  Labs are reviewed and discussed with patient.  Continue reduced dose Zanubrutinib  160 mg daily, he tolerates better  Encounter for antineoplastic chemotherapy Continue treatment as mentioned above.   Neutropenia Low neutrophil counts. Improved.  Orders Placed This Encounter  Procedures   CBC with Differential (Cancer Center Only)    Standing Status:   Future    Expected Date:   01/29/2024    Expiration Date:   04/28/2024   CMP (Cancer Center only)    Standing Status:   Future    Expected Date:   01/29/2024    Expiration Date:   04/28/2024   Lactate dehydrogenase    Standing Status:   Future    Expected Date:   01/29/2024    Expiration Date:   04/28/2024   Sample to Blood Bank    Standing Status:   Future    Expected Date:   01/29/2024    Expiration Date:   04/28/2024   Follow-up 1 month All questions were answered. The patient knows to call the clinic with any problems, questions or concerns.  Call Babara, MD, PhD [facility] Hematology Oncology 12/29/2023    CHIEF COMPLAINTS/PURPOSE OF CONSULTATION:  CLL  HISTORY OF PRESENTING ILLNESS:  Paul Nadara Browner [name] 78 y.o.  male presents for follow up of CLL I have reviewed his chart and materials related to his cancer extensively and collaborated history with the patient. Summary of oncologic history is as follows: Oncology History  CLL (chronic lymphocytic leukemia) (HCC)  12/25/2021 - 12/26/2021 Hospital Admission   Patient was admitted due to pain and swelling of right ankle, right knee and left wrist.  Patient was found to have WBC 30.4, with neutrophil 29% and lymphocyte 2%.  Peripheral smear showed leukocytosis with slight left shift in myeloid series and lymphocytosis with abnormal morphology.  Uric acid 5.5, worsening renal function with creatinine 1.35, BUN 18, GFR 55 (baseline creatinine 1.08 on 07/06/2021), lactic acid 1.1.  ESR normal, uric acid 5.5 normal, CRP 22.2, LDH 182  Orthopedic surgery was consulted s/p arthrocentesis, cell count 296 with 72% neutrophil, no crystal. Fluid negative for growth.  Patient received cefepime  and vancomycin  while in the hospital, Patient was discharged on Keflex  for 5 days.   12/28/2021 Cancer Staging   Staging form: Chronic Lymphocytic Leukemia / Small Lymphocytic Lymphoma, AJCC 8th Edition - Clinical stage from 12/28/2021: Modified Rai Stage III (Modified Rai risk: High, Lymphocytosis: Present, Adenopathy: Present, Organomegaly: Absent, Anemia: Present, Thrombocytopenia: Absent) - Signed by Babara Call, MD on 01/20/2022 Stage prefix: Initial diagnosis Hemoglobin (Hgb) (g/dL): 89.8   87/01/7974 Initial Diagnosis   CLL (chronic lymphocytic leukemia)   12/27/21 peripheral blood flowcytometry showed Involvement by CD5+, CD23+, CD20+, CD22+ clonal B cell population, phenotype typical for chronic lymphocytic leukemia/small lymphocytic lymphoma (  CLL/SLL), 2 clones present  Two monoclonal B cell populations were detected which have an identical  phenotype except for light chain expression.    01/05/2022 Imaging   CT chest abdomen pelvis wo contrast 1. Multiple prominent  borderline enlarged and mildly enlarged lymph nodes, most evident in the low anatomic pelvis, as above, compatible with reported clinical history of CLL. 2. There also several small pulmonary nodules in the lungs measuring 5 mm or less in size. This is nonspecific, but statistically likely benign. No follow-up needed if patient is low-risk (and has no known or suspected primary neoplasm). Non-contrast chest CT can be considered in 12 months if patient is high-risk. This recommendation 3. Aortic atherosclerosis, in addition to left main and 2 vessel coronary artery disease. Please note that although the presence of coronary artery calcium  documents the presence of coronary artery disease, the severity of this disease and any potential stenosis cannot be assessed on this non-gated CT examination. Assessment for  potential risk factor modification, dietary therapy or pharmacologic therapy may be warranted, if clinically indicated. 4. There are calcifications of the aortic valve. Echocardiographic correlation for evaluation of potential valvular dysfunction may be warranted if clinically indicated. 5. 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 6. Diverticulosis without evidence of acute diverticulitis at this time. 7. Mild cardiomegaly.   08/26/2022 Imaging   CT abdomen pelvis w contrast  1. Subcutaneous fat stranding within the left lower quadrant anterior abdominal wall extending into the anterior proximal left thigh, likely related to contusion given history of recent trauma. No fluid collection or hematoma. 2. No acute displaced fracture. 3. Retroperitoneal and pelvic lymphadenopathy as above, with waxing and waning appearance. 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 4. 1.9 cm hypodensity within the medial aspect of the spleen,nonspecific. Leukemic involvement of the spleen cannot be excluded. 5. Distal colonic diverticulosis without diverticulitis. 6.  Aortic Atherosclerosis     07/24/2023 Imaging   MRI cervical spine with and without contrast  1. Enhancing and indeterminate right C4 posterior element/facet bone lesion, which is superimposed on diffusely abnormal but nonspecific generalized decreased T1 marrow signal in the visible spine and at the skull base.   Marrow infiltrative processes such as myelofibrosis or lymphoma cannot be excluded. But the absence of other suspicious marrow edema or enhancement argues against metastatic disease or multiple myeloma. And furthermore similar generalized abnormal marrow signal can be caused by chronic anemia, smoking, obesity.   2. Underlying severe chronic cervical spine degeneration with reversal of lordosis and multifactorial degenerative cervical spinal stenosis at all levels. Associated moderate spinal stenosis and moderate (up to severe) spinal cord mass effect C3-C4 through C5-C6. No associated spinal cord edema or myelomalacia. Associated moderate or severe degenerative neural foraminal stenosis at the bilateral C4 through C8 nerve levels.       08/19/2023 Bone Marrow Biopsy   Bone marrow biopsy  aspirate clot core  - Hypercellular bone marrow (80%) involved by the patient's known  chronic lymphocytic leukemia at approximately 50% of the cellular  marrow.      08/23/2022 Colonoscopy showed - Likely malignant partially obstructing tumor in the distal ascending colon. Biopsied. Tattooed. - Three 4 to 6 mm polyps in the transverse colon and in the ascending colon, removed with a cold snare. Resected and retrieved. - Five 3 to 7 mm polyps in the rectum, in the descending colon, in the transverse colon and in the ascending colon, removed with a hot snare. Resected and retrieved. - Diverticulosis  in the left colon. - Non- bleeding internal hemorrhoids.  Pathology showed 1. Ascending Colon Polyp, x2 cold snare - TUBULAR ADENOMA(S) (MULTIPLE FRAGMENTS) - NEGATIVE FOR HIGH-GRADE DYSPLASIA OR MALIGNANCY 2.  Ascending Colon Polyp, hot snare - TUBULAR ADENOMA (MULTIPLE FRAGMENTS) - NEGATIVE FOR HIGH-GRADE DYSPLASIA OR MALIGNANCY 3. Ascending Colon Biopsy, mass cbx - TUBULAR ADENOMA (MULTIPLE FRAGMENTS) - SEE NOTE 4. Transverse Colon Polyp, hot snare; cold snare - TUBULAR ADENOMA (MULTIPLE FRAGMENTS) - NEGATIVE FOR HIGH-GRADE DYSPLASIA OR MALIGNANCY 5. Descending Colon Polyp, x2 hot snare - TUBULAR ADENOMA(S) (MULTIPLE FRAGMENTS) - NEGATIVE FOR HIGH-GRADE DYSPLASIA OR MALIGNANCY 6. Rectum, polyp(s), hot snare - TUBULOVILLOUS ADENOMA (MULTIPLE FRAGMENTS) - NEGATIVE FOR HIGH-GRADE DYSPLASIA OR MALIGNANCY I&D Recent admission due to sepsis due to cellulitis, left groin abscess, s/p I&D  04/24/2023 s/p colonoscopy, removal of small polyps.  04/26/2023 CT chest wo contrast  Pulmonary infiltrates as described and left pleural effusion correlate with bronchopneumonia. Follow-up recommended as clinically needed. Small pericardial effusion, correlation with echocardiography recommended as clinically needed.  04/27/2023 CT chest PE angiogram  No definite evidence of pulmonary embolus. Small left pleural effusion is noted. Airspace opacities are noted in lingular segment of left upper lobe as well as in left lower lobe most consistent with pneumonia. Minimal pericardial effusion. Aortic Atherosclerosis   Recent hospitalization due to right knee pain due to septic prepatella bursitis of right knee. He was treated wit IV abx. S/p FNA aspiration, culture was negative.   INTERVAL HISTORY Paul Vanhorn [name] is a 77 y.o. male who has above history reviewed by me today presents for follow up visit for CLL, iron  deficiency anemia, colon tubular adenoma with high-grade dysplastic status post resection. Patient finishes treatments for cellulitis.  Today he reports feeling well.  He mentioned that last week he was not feeling well.  No fever no chills. Patient is currently on dose reduced  Zanubrutinib . He has had weight loss.  He met with dietitian who recommended nutrition, weight is stable comparing to his last office visit.  He was recently admitted due to CHF exacerbation. Now he follows up with heart failure clinic.    MEDICAL HISTORY:  Past Medical History:  Diagnosis Date   Adenoma of left adrenal gland    Anemia    Aortic atherosclerosis    Blood transfusion without reported diagnosis    BPH (benign prostatic hyperplasia)    Bradycardia    CAD (coronary artery disease) 08/26/2022   a.) cCTA 08/26/2022: Ca2+ 14.3 (12th %'ile for age/sex.race match control); (<25%) pLAD   Cellulitis of left thigh 08/2022   Chronic pain syndrome    a.) on COT managed by pain management   Chronic, continuous use of opioids    a.) chronic pain syndrome/chronic back pain; managed by pain management   CKD (chronic kidney disease) stage 3, GFR 30-59 ml/min (HCC)    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   a.) Rai stage III   DDD (degenerative disc disease), cervical    Difficult airway 10/22/2015   a.) 1st attempt with glidescope  --> macroglossic (unsuccessful); b.) 2nd attempt with Mac 4 and direct laryngoscopy with a 8.0 ETT --> tube too large to pass through the cords; b.) 3rd attempt (successful) with a Mac 4 and a 7.5 tube   Diverticulosis    DM (diabetes mellitus), type 2 (HCC)    Dysplasia of prostate    Erectile dysfunction    Frequent falls    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)    a.) TTE 10/23/2015: EF 60-65%, mod-sev LVH, mild biatrial dil, degen MV disease, AoV sclerosis, asc Ao 38 mm, G1DD; b.) TTE 08/05/2022: EF 45-50%, mod-sev LVH (speckled pattern), sev biatrial dil, mild MR, AoV sclerosis, G1DD   Hepatic flexure mass 08/23/2022   a.) colonoscopy 08/23/2022: 14 x 15 mm partially obstructing ulcerated distal ascending colon mass; tissue friable --> pathology resulted as tubular adenoma, however felt to be false (-) -->  referral to sugery and colectomy recommended; b.) s/p RIGHT hemicolectomy 09/28/2022   History of MRSA infection 08/28/2022   a.) MRSA PCR (+) 08/28/2022; culture from LEFT thigh abscess   HTN (hypertension)    Hyperlipidemia    Hypogonadism in male    Hypothyroidism    IDA (iron  deficiency anemia)    LBBB (left bundle branch block)    Long-term use of aspirin  therapy    Lumbar spinal stenosis    Mild cardiomegaly    Multiple lung nodules on CT    NICM (nonischemic cardiomyopathy) (HCC)    a.) TTE 10/23/2015: EF 60-65; b.) TTE 08/05/2022: EF 45-50%   Orchitis and epididymitis 06/16/2013   OSA on CPAP    Palindromic rheumatism, hand    Pericardial effusion    Prostatitis    Sepsis (HCC) 08/2022   Subdural hematoma (HCC) 10/22/2015   a.) s/p traumatic mechanical fall --> CT head 10/22/2015: high-density SDH the left cerebral convexity with13 mm midline shift --> s/p LEFT frontal burr hole craniotomy   Tubular adenoma of colon    Ventral hernia     SURGICAL HISTORY: Past Surgical History:  Procedure Laterality Date   BACK SURGERY     BIOPSY  08/21/2022   Procedure: BIOPSY;  Surgeon: Onita Elspeth Sharper, DO;  Location[facility] ENDOSCOPY;  Service: Gastroenterology;;   BIOPSY  08/23/2022   Procedure: BIOPSY;  Surgeon: Onita Elspeth Sharper, DO;  Location: [facility] ENDOSCOPY;  Service: Gastroenterology;;   BIOPSY  02/28/2023   Procedure: BIOPSY;  Surgeon: Onita Elspeth Sharper, DO;  Location: [facility] ENDOSCOPY;  Service: Gastroenterology;;   SOLMON DAKIN OF CRANIUM Left 10/22/2015   COLON SURGERY  09/28/2022   COLONOSCOPY WITH PROPOFOL  N/A 08/23/2022   Procedure: COLONOSCOPY WITH PROPOFOL ;  Surgeon: Onita Elspeth Sharper, DO;  Location: [facility] ENDOSCOPY;  Service: Gastroenterology;  Laterality: N/A;   COLONOSCOPY WITH PROPOFOL  N/A 02/28/2023   Procedure: COLONOSCOPY WITH PROPOFOL ;  Surgeon: Onita Elspeth Sharper, DO;  Location: [facility] ENDOSCOPY;  Service: Gastroenterology;  Laterality: N/A;  DM    COLONOSCOPY WITH PROPOFOL  N/A 04/25/2023   Procedure: COLONOSCOPY WITH PROPOFOL ;  Surgeon: Onita Elspeth Sharper, DO;  Location: [facility] ENDOSCOPY;  Service: Gastroenterology;  Laterality: N/A;  DM   ESOPHAGOGASTRODUODENOSCOPY (EGD) WITH PROPOFOL  N/A 08/21/2022   Procedure: ESOPHAGOGASTRODUODENOSCOPY (EGD) WITH PROPOFOL ;  Surgeon: Onita Elspeth Sharper, DO;  Location: [facility] ENDOSCOPY;  Service: Gastroenterology;  Laterality: N/A;   ESOPHAGOGASTRODUODENOSCOPY (EGD) WITH PROPOFOL  N/A 02/28/2023   Procedure: ESOPHAGOGASTRODUODENOSCOPY (EGD) WITH PROPOFOL ;  Surgeon: Onita Elspeth Sharper, DO;  Location: [facility] ENDOSCOPY;  Service: Gastroenterology;  Laterality: N/A;   GIVENS CAPSULE STUDY N/A 08/23/2022   Procedure: GIVENS CAPSULE STUDY;  Surgeon: Onita Elspeth Sharper, DO;  Location: [facility] ENDOSCOPY;  Service: Gastroenterology;  Laterality: N/A;   HERNIA REPAIR     INGUINAL HERNIA REPAIR Right 03/09/2023   Procedure: HERNIA REPAIR INGUINAL ADULT, open, RNFA to assist;  Surgeon: Jordis Laneta FALCON, MD;  Location: [facility] ORS;  Service: General;  Laterality: Right;   IR BONE MARROW BIOPSY & ASPIRATION  08/19/2023  IR US  GUIDE BX ASP/DRAIN  06/27/2023   IRRIGATION AND DEBRIDEMENT ABSCESS Left 08/28/2022   Procedure: IRRIGATION AND DEBRIDEMENT ABSCESS LEFT UPPER THIGH/GROIN;  Surgeon: Jordis Laneta FALCON, MD;  Location: [facility] ORS;  Service: General;  Laterality: Left;   LAPAROSCOPIC RIGHT COLECTOMY Right 09/28/2022   Procedure: LAPAROSCOPIC RIGHT COLECTOMY, RNFA to assist;  Surgeon: Jordis Laneta FALCON, MD;  Location: [facility] ORS;  Service: General;  Laterality: Right;   POLYPECTOMY  08/23/2022   Procedure: POLYPECTOMY;  Surgeon: Onita Elspeth Sharper, DO;  Location: [facility] ENDOSCOPY;  Service: Gastroenterology;;   POLYPECTOMY  04/25/2023   Procedure: POLYPECTOMY;  Surgeon: Onita Elspeth Sharper, DO;  Location: [facility] ENDOSCOPY;  Service: Gastroenterology;;   SUBMUCOSAL TATTOO INJECTION  08/23/2022   Procedure: SUBMUCOSAL TATTOO  INJECTION;  Surgeon: Onita Elspeth Sharper, DO;  Location: [facility] ENDOSCOPY;  Service: Gastroenterology;;   TONSILLECTOMY     VENTRAL HERNIA REPAIR N/A 09/28/2022   Procedure: HERNIA REPAIR VENTRAL ADULT;  Surgeon: Jordis Laneta FALCON, MD;  Location: [facility] ORS;  Service: General;  Laterality: N/A;    SOCIAL HISTORY: Social History   Socioeconomic History   Marital status: Married    Spouse name: Not on file   Number of children: Not on file   Years of education: Not on file   Highest education level: 5th grade  Occupational History   Not on file  Tobacco Use   Smoking status: Former    Types: Cigarettes    Passive exposure: Past   Smokeless tobacco: Never   Tobacco comments:    Stop smoking iver 40  plus years ago  Vaping Use   Vaping status: Never Used  Substance and Sexual Activity   Alcohol use: No   Drug use: No   Sexual activity: Not Currently  Other Topics Concern   Not on file  Social History Narrative   Not on file   Social Drivers of Health   Financial Resource Strain: Patient Declined (04/26/2023)   Received from [facility]   Overall Financial Resource Strain (CARDIA)    Difficulty of Paying Living Expenses: Patient declined  Recent Concern: Financial Resource Strain - Medium Risk (02/24/2023)   Received from [facility]   Overall Financial Resource Strain (CARDIA)    Difficulty of Paying Living Expenses: Somewhat hard  Food Insecurity: No Food Insecurity (11/21/2023)   Hunger Vital Sign    Worried About Running Out of Food in the Last Year: Never true    Ran Out of Food in the Last Year: Never true  Transportation Needs: No Transportation Needs (11/21/2023)   PRAPARE - Administrator, Civil Service (Medical): No    Lack of Transportation (Non-Medical): No  Physical Activity: Unknown (07/26/2022)   Exercise Vital Sign    Days of Exercise per Week: Patient declined    Minutes of Exercise per Session: Not on file   Stress: Stress Concern Present (07/26/2022)   [employer] of Occupational Health - Occupational Stress Questionnaire    Feeling of Stress : To some extent  Social Connections: Moderately Integrated (11/21/2023)   Social Connection and Isolation Panel    Frequency of Communication with Friends and Family: Three times a week    Frequency of Social Gatherings with Friends and Family: Once a week    Attends Religious Services: More than 4 times per year    Active Member of [employer] or Organizations: No    Attends Banker Meetings: Never    Marital Status: Living  with partner  Intimate Partner Violence: Not At Risk (11/21/2023)   Humiliation, Afraid, Rape, and Kick questionnaire    Fear of Current or Ex-Partner: No    Emotionally Abused: No    Physically Abused: No    Sexually Abused: No    FAMILY HISTORY: Family History  Problem Relation Age of Onset   Cancer Maternal Aunt    ADD / ADHD Daughter    Asthma Son    Prostate cancer Neg Hx    Kidney disease Neg Hx    Kidney cancer Neg Hx    Bladder Cancer Neg Hx     ALLERGIES:  has no known allergies.  MEDICATIONS:  Current Outpatient Medications  Medication Sig Dispense Refill   acetaminophen  (TYLENOL ) 325 MG tablet Take 2 tablets (650 mg total) by mouth every 6 (six) hours as needed for mild pain (pain score 1-3) or fever.     albuterol  (VENTOLIN  HFA) 108 (90 Base) MCG/ACT inhaler Inhale 2 puffs into the lungs every 4 (four) hours as needed for wheezing or shortness of breath. 1 each 2   aspirin  EC 81 MG tablet Take 1 tablet (81 mg total) by mouth daily. Swallow whole.     budesonide -glycopyrrolate -formoterol  (BREZTRI ) 160-9-4.8 MCG/ACT AERO inhaler Inhale 2 puffs into the lungs 2 (two) times daily. 10.7 each 0   DM-Doxylamine-Acetaminophen  (NYQUIL COLD & FLU PO) Take by mouth as needed.     empagliflozin  (JARDIANCE ) 10 MG TABS tablet Take 1 tablet (10 mg total) by mouth daily before breakfast. 90 tablet 2    furosemide  (LASIX ) 40 MG tablet Take 1 tablet (40 mg total) by mouth daily. 30 tablet 0   hydrALAZINE  (APRESOLINE ) 50 MG tablet Take 2 tablets by mouth twice daily 180 tablet 0   levothyroxine  (SYNTHROID ) 200 MCG tablet Take 1 tablet (200 mcg total) by mouth daily before breakfast. 90 tablet 0   metFORMIN  (GLUCOPHAGE ) 500 MG tablet Take 2 tablets (1,000 mg total) by mouth 2 (two) times daily with a meal. 120 tablet 0   metoprolol  succinate (TOPROL  XL) 25 MG 24 hr tablet Take 1 tablet (25 mg total) by mouth daily. 90 tablet 1   mirtazapine  (REMERON ) 7.5 MG tablet Take 7.5 mg by mouth at bedtime.     naloxone  (NARCAN ) nasal spray 4 mg/0.1 mL Place 1 spray into the nose as needed for up to 365 doses (for opioid-induced respiratory depresssion). In case of emergency (overdose), spray once into each nostril. If no response within 3 minutes, repeat application and call 911. 1 each 1   oxyCODONE -acetaminophen  (PERCOCET) 10-325 MG tablet Take 1 tablet by mouth every 8 (eight) hours as needed for pain. Must last 30 days. 90 tablet 0   [START ON 01/21/2024] oxyCODONE -acetaminophen  (PERCOCET) 10-325 MG tablet Take 1 tablet by mouth every 8 (eight) hours as needed for pain. Must last 30 days. 90 tablet 0   [START ON 02/20/2024] oxyCODONE -acetaminophen  (PERCOCET) 10-325 MG tablet Take 1 tablet by mouth every 8 (eight) hours as needed for pain. Must last 30 days. 90 tablet 0   rosuvastatin  (CRESTOR ) 40 MG tablet Take 1 tablet (40 mg total) by mouth daily. 90 tablet 1   sacubitril -valsartan  (ENTRESTO ) 97-103 MG Take 1 tablet by mouth 2 (two) times daily. 60 tablet 0   spironolactone  (ALDACTONE ) 25 MG tablet Take 1 tablet (25 mg total) by mouth daily. 90 tablet 3   zanubrutinib  (BRUKINSA ) 160 MG tablet Take 1 tablet (160 mg total) by mouth 2 (two) times daily. 60 tablet  0   No current facility-administered medications for this visit.    Review of Systems  Constitutional:  Positive for fatigue. Negative for  appetite change, chills, fever and unexpected weight change.  HENT:   Negative for hearing loss and voice change.   Eyes:  Negative for eye problems and icterus.  Respiratory:  Negative for chest tightness, cough and shortness of breath.   Cardiovascular:  Negative for chest pain and leg swelling.  Gastrointestinal:  Negative for abdominal distention and abdominal pain.  Endocrine: Negative for hot flashes.  Genitourinary:  Negative for difficulty urinating, dysuria and frequency.   Musculoskeletal:  Positive for arthralgias.  Skin:  Negative for itching and rash.  Neurological:  Negative for light-headedness and numbness.  Hematological:  Negative for adenopathy. Does not bruise/bleed easily.  Psychiatric/Behavioral:  Negative for confusion.      PHYSICAL EXAMINATION: ECOG PERFORMANCE STATUS: 1 - Symptomatic but completely ambulatory  Vitals:   12/29/23 1032  BP: 132/76  Pulse: 69  Resp: 20  Temp: 98.6 F (37 C)  SpO2: 100%   Filed Weights   12/29/23 1032  Weight: 209 lb 8 oz (95 kg)    Physical Exam Constitutional:      General: He is not in acute distress.    Appearance: He is obese. He is not diaphoretic.  HENT:     Head: Normocephalic and atraumatic.  Eyes:     General: No scleral icterus.    Pupils: Pupils are equal, round, and reactive to light.  Cardiovascular:     Rate and Rhythm: Normal rate.     Heart sounds: No murmur heard. Pulmonary:     Effort: Pulmonary effort is normal. No respiratory distress.     Breath sounds: No wheezing.  Abdominal:     General: There is no distension.     Palpations: Abdomen is soft.  Genitourinary:    Comments:   Musculoskeletal:     Cervical back: Normal range of motion and neck supple.  Skin:    General: Skin is warm and dry.     Findings: No erythema.  Neurological:     Mental Status: He is alert and oriented to person, place, and time. Mental status is at baseline.     Cranial Nerves: No cranial nerve deficit.      Motor: No abnormal muscle tone.  Psychiatric:        Mood and Affect: Mood and affect normal.        LABORATORY DATA:  I have reviewed the data as listed    Latest Ref Rng & Units 12/29/2023   10:15 AM 12/13/2023   10:27 AM 11/28/2023    5:43 AM  CBC  WBC 4.0 - 10.5 K/uL 14.0  17.9  16.9   Hemoglobin 13.0 - 17.0 g/dL 89.8  89.5  89.6   Hematocrit 39.0 - 52.0 % 32.1  34.1  32.4   Platelets 150 - 400 K/uL 166  138  253       Latest Ref Rng & Units 12/29/2023   10:15 AM 12/13/2023   10:27 AM 11/29/2023    5:47 AM  CMP  Glucose 70 - 99 mg/dL 883  82  856   BUN 8 - 23 mg/dL 25  17  45   Creatinine 0.61 - 1.24 mg/dL 8.51  8.60  8.86   Sodium 135 - 145 mmol/L 142  140  140   Potassium 3.5 - 5.1 mmol/L 4.9  3.8  4.3   Chloride 98 -  111 mmol/L 106  106  109   CO2 22 - 32 mmol/L 28  24  25    Calcium  8.9 - 10.3 mg/dL 9.6  8.6  8.1   Total Protein 6.5 - 8.1 g/dL 6.7  6.8    Total Bilirubin 0.0 - 1.2 mg/dL 0.5  1.0    Alkaline Phos 38 - 126 U/L 79  69    AST 15 - 41 U/L 34  25    ALT 0 - 44 U/L 18  18       RADIOGRAPHIC STUDIES: I have personally reviewed the radiological images as listed and agreed with the findings in the report. No results found.

## 2023-12-29 NOTE — Assessment & Plan Note (Signed)
 Continue treatment as mentioned above.

## 2023-12-29 NOTE — Assessment & Plan Note (Signed)
 Labs are reviewed and discussed with patient. CT chest abdomen pelvis results reviewed and discussed with patient.   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  50% of bone marrow is involved with CLL.  Labs are reviewed and discussed with patient.  Continue reduced dose Zanubrutinib  160 mg daily, he tolerates better

## 2024-01-03 ENCOUNTER — Telehealth: Payer: Self-pay | Admitting: Family

## 2024-01-03 NOTE — Telephone Encounter (Signed)
 Called to confirm/remind patient of their appointment at [facility] on 01/04/24.   Appointment:   [] Confirmed  [] Left mess   [x] No answer/No voice mail  [] VM Full/unable to leave message  [] Phone not in service  Patient reminded to bring all medications and/or complete list.  Confirmed patient has transportation. Gave directions, instructed to utilize valet parking.

## 2024-01-03 NOTE — Progress Notes (Deleted)
 "  [facility] NOTE  Referring Physician: Cyrus Selinda Moose, PA-C Primary Care: Whitaker, Selinda Moose, PA-C Primary Cardiologist: Redell Cave, MD HF Provider: Rolan Fuel, MD  Chief Complaint:    HPI:  Paul Gaertner [name] is a 78 y.o. male  with h/o HFmrEF, NICM, LVH, HTN, nonobstructive CAD, diabetes, CLL s/p chemo, CKD stage 3, IDA who presents for pre-op evaluation.    Diagnsed with CLL in 12/23   The patient was initially seen 05/2022 for HTN and he was started on Diovan  160mg  daily. EKG showed LBBB and an echo was ordered. Echo showed LVEF 45-50%, no WMA, moderate to severe LVH with speckled appearance, consider for amyloidosis, severely dilated left atrium, mildly dilated right atrium, small pericardial effusion with no tamponade, mild MR. Plan was for cardiac CTA and cardiac MRI.  Cardiac CTA showed coronary calcium  score of 14.3, 12th percentile for age and sex matched control, minimal proximal LAD stenosis, overall minimal nonobstructive CAD.   Patient was admitted August 26, 2022 with a mechanical fall, cellulitis of the left groin/SIRS, and mildly elevated troponin. He was briefly seen by cardiology and felt to be stable. No inpatient cardiac work-up was pursued.    Seen 09/17/22 for pre-op for laparoscopic colectomy 09/28/22. He felt weak since being discharged from the hospital, but was overall stable from a cardiac perspective. He was encouraged to get the cardiac MRI done.   cMRI 3/25 EF 43% Mod-sev basal septal hypertrophy (16mm) RVEF 48% No LGE Mild MR .Findings suggest non ischemic cardiomyopathy. Possible inflammatory disease involving the basal lateral wall.  Follows with Onc for his CLL. Zanubrutinib  was stopped due to anemia. CT chest 04/27/23 No PE. No mention of sarcoid   Admitted 06/23/23 with right knee pain/ swelling. Treated with antibiotics for septic prepatellar bursitis or right knee.   ED visit 07/08/23 with right knee / right shoulder  pain after being hit by a cement slide at work which knocked him on the ground. Knee xray and head / neck CT negative.   2 ED visits 08/25 due to abscesses.   ED visit 10/01/23 for leg cellulitis.   Admitted 11/21/23 with worsening of cough wheezing shortness of breath for the last 2 weeks. IV diuresed due to HF exacerbation. Echo 11/22/23: EF 35-40%, moderate LVH, G2DD, mildly reduced RV, severely dilated LA, mild/ moderate MR, mild dilatation of the aortic root, measuring 44 mm. Treated with antibiotics for COPD exacerbation.    He presents today with a chief complaint of a HF visit.    ROS: All systems negative except what is listed in HPI, PMH and Problem List  Past Medical History:  Diagnosis Date   Adenoma of left adrenal gland    Anemia    Aortic atherosclerosis    Blood transfusion without reported diagnosis    BPH (benign prostatic hyperplasia)    Bradycardia    CAD (coronary artery disease) 08/26/2022   a.) cCTA 08/26/2022: Ca2+ 14.3 (12th %'ile for age/sex.race match control); (<25%) pLAD   Cellulitis of left thigh 08/2022   Chronic pain syndrome    a.) on COT managed by pain management   Chronic, continuous use of opioids    a.) chronic pain syndrome/chronic back pain; managed by pain management   CKD (chronic kidney disease) stage 3, GFR 30-59 ml/min (HCC)    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   a.) Rai stage III   DDD (degenerative disc disease), cervical    Difficult airway 10/22/2015  a.) 1st attempt with glidescope  --> macroglossic (unsuccessful); b.) 2nd attempt with Mac 4 and direct laryngoscopy with a 8.0 ETT --> tube too large to pass through the cords; b.) 3rd attempt (successful) with a Mac 4 and a 7.5 tube   Diverticulosis    DM (diabetes mellitus), type 2 (HCC)    Dysplasia of prostate    Erectile dysfunction    Frequent falls    Heart failure with mildly reduced ejection fraction (HFmrEF) (HCC)    a.) TTE 10/23/2015: EF 60-65%,  mod-sev LVH, mild biatrial dil, degen MV disease, AoV sclerosis, asc Ao 38 mm, G1DD; b.) TTE 08/05/2022: EF 45-50%, mod-sev LVH (speckled pattern), sev biatrial dil, mild MR, AoV sclerosis, G1DD   Hepatic flexure mass 08/23/2022   a.) colonoscopy 08/23/2022: 14 x 15 mm partially obstructing ulcerated distal ascending colon mass; tissue friable --> pathology resulted as tubular adenoma, however felt to be false (-) --> referral to sugery and colectomy recommended; b.) s/p RIGHT hemicolectomy 09/28/2022   History of MRSA infection 08/28/2022   a.) MRSA PCR (+) 08/28/2022; culture from LEFT thigh abscess   HTN (hypertension)    Hyperlipidemia    Hypogonadism in male    Hypothyroidism    IDA (iron  deficiency anemia)    LBBB (left bundle branch block)    Long-term use of aspirin  therapy    Lumbar spinal stenosis    Mild cardiomegaly    Multiple lung nodules on CT    NICM (nonischemic cardiomyopathy) (HCC)    a.) TTE 10/23/2015: EF 60-65; b.) TTE 08/05/2022: EF 45-50%   Orchitis and epididymitis 06/16/2013   OSA on CPAP    Palindromic rheumatism, hand    Pericardial effusion    Prostatitis    Sepsis (HCC) 08/2022   Subdural hematoma (HCC) 10/22/2015   a.) s/p traumatic mechanical fall --> CT head 10/22/2015: high-density SDH the left cerebral convexity with13 mm midline shift --> s/p LEFT frontal burr hole craniotomy   Tubular adenoma of colon    Ventral hernia     Current Outpatient Medications  Medication Sig Dispense Refill   acetaminophen  (TYLENOL ) 325 MG tablet Take 2 tablets (650 mg total) by mouth every 6 (six) hours as needed for mild pain (pain score 1-3) or fever.     albuterol  (VENTOLIN  HFA) 108 (90 Base) MCG/ACT inhaler Inhale 2 puffs into the lungs every 4 (four) hours as needed for wheezing or shortness of breath. 1 each 2   aspirin  EC 81 MG tablet Take 1 tablet (81 mg total) by mouth daily. Swallow whole.     DM-Doxylamine-Acetaminophen  (NYQUIL COLD & FLU PO) Take by mouth  as needed.     empagliflozin  (JARDIANCE ) 10 MG TABS tablet Take 1 tablet (10 mg total) by mouth daily before breakfast. 90 tablet 2   furosemide  (LASIX ) 40 MG tablet Take 1 tablet (40 mg total) by mouth daily. 30 tablet 0   hydrALAZINE  (APRESOLINE ) 50 MG tablet Take 2 tablets by mouth twice daily 180 tablet 0   levothyroxine  (SYNTHROID ) 200 MCG tablet Take 1 tablet (200 mcg total) by mouth daily before breakfast. 90 tablet 0   metFORMIN  (GLUCOPHAGE ) 500 MG tablet Take 2 tablets (1,000 mg total) by mouth 2 (two) times daily with a meal. 120 tablet 0   metoprolol  succinate (TOPROL  XL) 25 MG 24 hr tablet Take 1 tablet (25 mg total) by mouth daily. 90 tablet 1   mirtazapine  (REMERON ) 7.5 MG tablet Take 7.5 mg by mouth at bedtime.  naloxone  (NARCAN ) nasal spray 4 mg/0.1 mL Place 1 spray into the nose as needed for up to 365 doses (for opioid-induced respiratory depresssion). In case of emergency (overdose), spray once into each nostril. If no response within 3 minutes, repeat application and call 911. 1 each 1   oxyCODONE -acetaminophen  (PERCOCET) 10-325 MG tablet Take 1 tablet by mouth every 8 (eight) hours as needed for pain. Must last 30 days. 90 tablet 0   [START ON 01/21/2024] oxyCODONE -acetaminophen  (PERCOCET) 10-325 MG tablet Take 1 tablet by mouth every 8 (eight) hours as needed for pain. Must last 30 days. 90 tablet 0   [START ON 02/20/2024] oxyCODONE -acetaminophen  (PERCOCET) 10-325 MG tablet Take 1 tablet by mouth every 8 (eight) hours as needed for pain. Must last 30 days. 90 tablet 0   rosuvastatin  (CRESTOR ) 40 MG tablet Take 1 tablet (40 mg total) by mouth daily. 90 tablet 1   spironolactone  (ALDACTONE ) 25 MG tablet Take 1 tablet (25 mg total) by mouth daily. 90 tablet 3   zanubrutinib  (BRUKINSA ) 160 MG tablet Take 1 tablet (160 mg total) by mouth 2 (two) times daily. 60 tablet 0   No current facility-administered medications for this visit.    No Known Allergies    Social History    Socioeconomic History   Marital status: Married    Spouse name: Not on file   Number of children: Not on file   Years of education: Not on file   Highest education level: 5th grade  Occupational History   Not on file  Tobacco Use   Smoking status: Former    Types: Cigarettes    Passive exposure: Past   Smokeless tobacco: Never   Tobacco comments:    Stop smoking iver 40  plus years ago  Vaping Use   Vaping status: Never Used  Substance and Sexual Activity   Alcohol use: No   Drug use: No   Sexual activity: Not Currently  Other Topics Concern   Not on file  Social History Narrative   Not on file   Social Drivers of Health   Financial Resource Strain: Patient Declined (04/26/2023)   Received from [facility]   Overall Financial Resource Strain (CARDIA)    Difficulty of Paying Living Expenses: Patient declined  Recent Concern: Financial Resource Strain - Medium Risk (02/24/2023)   Received from [facility]   Overall Financial Resource Strain (CARDIA)    Difficulty of Paying Living Expenses: Somewhat hard  Food Insecurity: No Food Insecurity (11/21/2023)   Hunger Vital Sign    Worried About Running Out of Food in the Last Year: Never true    Ran Out of Food in the Last Year: Never true  Transportation Needs: No Transportation Needs (11/21/2023)   PRAPARE - Administrator, Civil Service (Medical): No    Lack of Transportation (Non-Medical): No  Physical Activity: Unknown (07/26/2022)   Exercise Vital Sign    Days of Exercise per Week: Patient declined    Minutes of Exercise per Session: Not on file  Stress: Stress Concern Present (07/26/2022)   [employer] of Occupational Health - Occupational Stress Questionnaire    Feeling of Stress : To some extent  Social Connections: Moderately Integrated (11/21/2023)   Social Connection and Isolation Panel    Frequency of Communication with Friends and Family: Three times a week     Frequency of Social Gatherings with Friends and Family: Once a week    Attends Religious Services: More  than 4 times per year    Active Member of Clubs or Organizations: No    Attends Banker Meetings: Never    Marital Status: Living with partner  Intimate Partner Violence: Not At Risk (11/21/2023)   Humiliation, Afraid, Rape, and Kick questionnaire    Fear of Current or Ex-Partner: No    Emotionally Abused: No    Physically Abused: No    Sexually Abused: No      Family History  Problem Relation Age of Onset   Cancer Maternal Aunt    ADD / ADHD Daughter    Asthma Son    Prostate cancer Neg Hx    Kidney disease Neg Hx    Kidney cancer Neg Hx    Bladder Cancer Neg Hx       PHYSICAL EXAM:  General: Well appearing. HOH. Right sclera is red. He's able to open / close the eye without difficulty. Eyelid appears swollen.  Cor: No JVD. Regular rhythm, bradycardic.  Lungs: rhonchi in bilateral lower lobes Abdomen: soft, nontender, nondistended. Extremities: no edema Neuro:. Affect pleasant   ECG: not done   ASSESSMENT & PLAN:  1. Chronic systolic HF due to NICM  - most likely etiology is HTN cardiomyopathy  - Echo 5/24 LVEF 45-50%, no WMA, moderate to severe LVH, severely dilated left atrium, mildly dilated right atrium, small pericardial effusion with no tamponade, mild MR. - Cardiac CTA CAC 14.3,minimal proximal LAD stenosis, overall minimal nonobstructive CAD. - cMRI 3/25 EF 43% Mod-sev basal septal hypertrophy (16mm) RVEF 48% No LGE Mild MR .Findings suggest non ischemic cardiomyopathy. Possible inflammatory disease involving the basal lateral wall. - cardiac PET 06/23/23: Pulmonary artery enlargement suggests pulmonary arterial hypertension. - Echo 11/22/23: EF 35-40%, moderate LVH, G2DD, mildly reduced RV, severely dilated LA, mild/ moderate MR, mild dilatation of the aortic root, measuring 44 mm. - NYHA I - euvolemic today.  - Weight 220 pounds from  last clinic visit 1 month ago - Continue jardiance  10mg  daily - Continue furosemide  40mg  daily - Continue hydralazine  100mg  BID - Continue toprol  25mg  daily. HR today limits titration - Continue Entresto  97/103mg  bid - Continue spironolactone  25mg  daily.   2. HTN - BP  - Continue hydralazine , toprol , entresto  and beginning spironolactone  today - BMET 12/29/23 reviewed: sodium 142, potassium 4.9, creatinine 1.48, GFR 48 - Since he had lab work done last week, will not check today. Plan to recheck in 2 weeks.    3. CLL - Oncology following (last seen 12/25)  4: OSA - sleep study done 04/17/20 - follows w/ pulmonology (last seen 11/25) - patient says that he has CPAP at home but doesn't wear it as he sleeps like a baby. Reviewed how untreated sleep apnea can affect his heart and that his EF has declined from last year. Encouraged him to begin wearing it.     Ellouise DELENA Class, FNP-C 01/04/24 "

## 2024-01-04 ENCOUNTER — Telehealth: Payer: Self-pay | Admitting: Family

## 2024-01-04 ENCOUNTER — Ambulatory Visit: Admitting: Family

## 2024-01-04 NOTE — Telephone Encounter (Signed)
 Patient did not show for his [facility] appointment on 01/04/24.

## 2024-01-09 ENCOUNTER — Other Ambulatory Visit: Payer: Self-pay

## 2024-01-12 ENCOUNTER — Other Ambulatory Visit: Payer: Self-pay

## 2024-01-16 ENCOUNTER — Other Ambulatory Visit (HOSPITAL_COMMUNITY): Payer: Self-pay

## 2024-02-12 ENCOUNTER — Emergency Department: Admission: EM | Admit: 2024-02-12 | Discharge: 2024-02-12 | Disposition: A | Discharge status: Home / Self Care

## 2024-02-12 ENCOUNTER — Encounter: Payer: Self-pay | Admitting: Emergency Medicine

## 2024-02-12 ENCOUNTER — Emergency Department

## 2024-02-12 ENCOUNTER — Other Ambulatory Visit: Payer: Self-pay

## 2024-02-12 DIAGNOSIS — Y9301 Activity, walking, marching and hiking: Secondary | ICD-10-CM | POA: Diagnosis not present

## 2024-02-12 DIAGNOSIS — M25511 Pain in right shoulder: Secondary | ICD-10-CM | POA: Insufficient documentation

## 2024-02-12 DIAGNOSIS — R52 Pain, unspecified: Secondary | ICD-10-CM

## 2024-02-12 DIAGNOSIS — W19XXXA Unspecified fall, initial encounter: Secondary | ICD-10-CM | POA: Diagnosis not present

## 2024-02-12 DIAGNOSIS — M791 Myalgia, unspecified site: Secondary | ICD-10-CM | POA: Insufficient documentation

## 2024-02-12 DIAGNOSIS — Y92007 Garden or yard of unspecified non-institutional (private) residence as the place of occurrence of the external cause: Secondary | ICD-10-CM | POA: Insufficient documentation

## 2024-02-12 DIAGNOSIS — M542 Cervicalgia: Secondary | ICD-10-CM | POA: Insufficient documentation

## 2024-02-12 DIAGNOSIS — S0990XA Unspecified injury of head, initial encounter: Secondary | ICD-10-CM | POA: Insufficient documentation

## 2024-02-12 MED ORDER — IBUPROFEN 600 MG PO TABS
600.0000 mg | ORAL_TABLET | Freq: Once | ORAL | Status: AC
  Administered 2024-02-12: 600 mg via ORAL
  Filled 2024-02-12: qty 1

## 2024-02-12 NOTE — ED Triage Notes (Signed)
 Pt presents to the ED via POV with complaints of body soreness - pt notes falling on Friday night in the yard landing on his R side. He notes taking oxycodone  without any improvement. A&Ox4 at this time. Denies hitting his head, nor on thinners, CP or SOB.

## 2024-02-12 NOTE — Discharge Instructions (Signed)
 You were seen today due to concern of bodyaches, at this time fortunately your imaging is reassuring, as you have discussed, you may have a rotator cuff tear on your right shoulder, you should have this followed up with the orthopedic surgeon to have further assessment.  You may take ibuprofen  and Tylenol  at home to help manage your pain.  If you have any worsening of symptoms such as severely increased pain, headache, confusion or any other symptoms you find concerning please return to the emergency department immediately for further medical management.

## 2024-02-12 NOTE — ED Provider Notes (Signed)
 "  [facility] Provider Note    Event Date/Time   First MD Initiated Contact with Patient 02/12/24 [phone]     (approximate)   History   Fall   HPI  Paul Hanf [name] is a 79 y.o. male presenting with concern of a fall.  He actually was walking behind his son on Friday, he did not notice a few blocks that were in the way and he fell forward he landed on his son who helped break his fall, denies any loss of consciousness or head trauma at that time.  Afterwards he was able to get back up and go about his day without significant issues, this morning when he woke up significant generalized pain everywhere, greatest in his right shoulder and neck, states that he is just having some trouble walking and dealing with the pain.  No other complaints at this time denies any focal area of pain complaining of generalized soreness.  Has not been around anyone sick and denies any fevers.  Tried taking oxycodone  without much relief of symptoms.     Physical Exam   Triage Vital Signs: ED Triage Vitals  Encounter Vitals Group     BP 02/12/24 0334 (!) 178/69     Girls Systolic BP Percentile --      Girls Diastolic BP Percentile --      Boys Systolic BP Percentile --      Boys Diastolic BP Percentile --      Pulse Rate 02/12/24 0334 68     Resp 02/12/24 0334 18     Temp 02/12/24 0334 98.4 F (36.9 C)     Temp Source 02/12/24 0334 Oral     SpO2 02/12/24 0334 100 %     Weight 02/12/24 0331 208 lb (94.3 kg)     Height 02/12/24 0331 6' 1 (1.854 m)     Head Circumference --      Peak Flow --      Pain Score 02/12/24 0327 10     Pain Loc --      Pain Education --      Exclude from Growth Chart --     Most recent vital signs: Vitals:   02/12/24 0334 02/12/24 0417  BP: (!) 178/69   Pulse: 68   Resp: 18   Temp: 98.4 F (36.9 C)   SpO2: 100% 100%     General: Awake, no distress.  CV:  Good peripheral perfusion.  Resp:  Normal effort.  Abd:  No distention.   MSK:  No reproducible cervical thoracic lumbar spinal tenderness.  I do not appreciate significant tenderness to palpation across the extremities, he has limited range of motion of the shoulders bilaterally where he is not able to abduct arm greater than 90 degrees, appropriate range of motion of the elbows fingers good grip strength, appropriate range of motion of the bilateral lower extremities Other:     ED Results / Procedures / Treatments   Labs (all labs ordered are listed, but only abnormal results are displayed) Labs Reviewed - No data to display   EKG     RADIOLOGY On my independent interpretation of this x-ray of the hip no acute fractures On my independent interpretation of shoulder x-ray no acute fractures On my independent interpretation of the knee x-ray no acute fractures  PROCEDURES:  Critical Care performed: No  Procedures   MEDICATIONS ORDERED IN ED: Medications  ibuprofen  (ADVIL ) tablet 600 mg (600 mg Oral Given 02/12/24 0426)  IMPRESSION / MDM / ASSESSMENT AND PLAN / ED COURSE  I reviewed the triage vital signs and the nursing notes.                               Patient's presentation is most consistent with acute complicated illness / injury requiring diagnostic workup.  79 year old male who presents today with concern of generalized aches, he had multiple images ordered while in triage, no evidence of acute injury, we will follow-up the results of the images, will give him a dose of ibuprofen  here I suspect reasonable for discharge home afterwards.   Clinical Course as of 02/12/24 9474  [place] Feb 12, 2024  0442 X-ray of the shoulder mentions chronic rotator cuff disease which I discussed with the patient and instructed to follow-up with orthopedic surgery.  CT imaging is without acute findings. [SK]    Clinical Course User Index [SK] Paul Briggs HERO, MD     FINAL CLINICAL IMPRESSION(S) / ED DIAGNOSES   Final diagnoses:  Fall, initial  encounter  Body aches     Rx / DC Orders   ED Discharge Orders     None        Note:  This document was prepared using Dragon voice recognition software and may include unintentional dictation errors.   Paul Briggs HERO, MD 02/12/24 [phone]  "

## 2024-02-13 ENCOUNTER — Encounter: Payer: Self-pay | Admitting: Oncology

## 2024-02-13 ENCOUNTER — Other Ambulatory Visit: Payer: Self-pay | Admitting: Family Medicine

## 2024-02-13 ENCOUNTER — Inpatient Hospital Stay: Attending: Oncology

## 2024-02-13 ENCOUNTER — Inpatient Hospital Stay: Admitting: Oncology

## 2024-02-13 ENCOUNTER — Ambulatory Visit: Payer: Self-pay | Admitting: Oncology

## 2024-02-13 VITALS — BP 160/71 | Temp 96.0°F | Resp 97 | Wt 210.5 lb

## 2024-02-13 DIAGNOSIS — Z5111 Encounter for antineoplastic chemotherapy: Secondary | ICD-10-CM | POA: Diagnosis not present

## 2024-02-13 DIAGNOSIS — C9110 Chronic lymphocytic leukemia of B-cell type not having achieved remission: Secondary | ICD-10-CM | POA: Diagnosis not present

## 2024-02-13 DIAGNOSIS — G8929 Other chronic pain: Secondary | ICD-10-CM

## 2024-02-13 DIAGNOSIS — D509 Iron deficiency anemia, unspecified: Secondary | ICD-10-CM

## 2024-02-13 DIAGNOSIS — R634 Abnormal weight loss: Secondary | ICD-10-CM | POA: Diagnosis not present

## 2024-02-13 LAB — CBC WITH DIFFERENTIAL (CANCER CENTER ONLY)
Abs Immature Granulocytes: 0.05 K/uL (ref 0.00–0.07)
Basophils Absolute: 0.1 K/uL (ref 0.0–0.1)
Basophils Relative: 0 %
Eosinophils Absolute: 0.2 K/uL (ref 0.0–0.5)
Eosinophils Relative: 1 %
HCT: 27.5 % — ABNORMAL LOW (ref 39.0–52.0)
Hemoglobin: 8.5 g/dL — ABNORMAL LOW (ref 13.0–17.0)
Immature Granulocytes: 0 %
Lymphocytes Relative: 72 %
Lymphs Abs: 13.4 K/uL — ABNORMAL HIGH (ref 0.7–4.0)
MCH: 27.2 pg (ref 26.0–34.0)
MCHC: 30.9 g/dL (ref 30.0–36.0)
MCV: 88.1 fL (ref 80.0–100.0)
Monocytes Absolute: 0.6 K/uL (ref 0.1–1.0)
Monocytes Relative: 3 %
Neutro Abs: 4.6 K/uL (ref 1.7–7.7)
Neutrophils Relative %: 24 %
Platelet Count: 152 K/uL (ref 150–400)
RBC: 3.12 MIL/uL — ABNORMAL LOW (ref 4.22–5.81)
RDW: 17.2 % — ABNORMAL HIGH (ref 11.5–15.5)
WBC Count: 18.9 K/uL — ABNORMAL HIGH (ref 4.0–10.5)
nRBC: 0 % (ref 0.0–0.2)

## 2024-02-13 LAB — CMP (CANCER CENTER ONLY)
ALT: 26 U/L (ref 0–44)
AST: 42 U/L — ABNORMAL HIGH (ref 15–41)
Albumin: 4.3 g/dL (ref 3.5–5.0)
Alkaline Phosphatase: 77 U/L (ref 38–126)
Anion gap: 10 (ref 5–15)
BUN: 19 mg/dL (ref 8–23)
CO2: 26 mmol/L (ref 22–32)
Calcium: 9.1 mg/dL (ref 8.9–10.3)
Chloride: 108 mmol/L (ref 98–111)
Creatinine: 1.29 mg/dL — ABNORMAL HIGH (ref 0.61–1.24)
GFR, Estimated: 57 mL/min — ABNORMAL LOW
Glucose, Bld: 95 mg/dL (ref 70–99)
Potassium: 4 mmol/L (ref 3.5–5.1)
Sodium: 143 mmol/L (ref 135–145)
Total Bilirubin: 0.5 mg/dL (ref 0.0–1.2)
Total Protein: 7 g/dL (ref 6.5–8.1)

## 2024-02-13 LAB — SAMPLE TO BLOOD BANK

## 2024-02-13 LAB — LACTATE DEHYDROGENASE: LDH: 299 U/L — ABNORMAL HIGH (ref 105–235)

## 2024-02-13 NOTE — Progress Notes (Signed)
 " Hematology/Oncology Progress note [phone] [phone]        ASSESSMENT & PLAN:   Cancer Staging  CLL (chronic lymphocytic leukemia) (HCC) Staging form: Chronic Lymphocytic Leukemia / Small Lymphocytic Lymphoma, AJCC 8th Edition - Clinical stage from 12/28/2021: Modified Rai Stage III (Modified Rai risk: High, Lymphocytosis: Present, Adenopathy: Present, Organomegaly: Absent, Anemia: Present, Thrombocytopenia: Absent) - Signed by Babara Call, MD on 01/20/2022   CLL (chronic lymphocytic leukemia) [facility]) Labs are reviewed and discussed with patient. CT chest abdomen pelvis results reviewed and discussed with patient.   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  50% of bone marrow is involved with CLL.  Labs are reviewed and discussed with patient.  Lymphocytosis is getting worse.  Clinically he does not have signs of acute infection. Possible progression.  Consider switching to noncoherent BTK inhibitor pirtobrutinib .  Will check insurance approval.  Encounter for antineoplastic chemotherapy Continue treatment as mentioned above.   IDA (iron  deficiency anemia) Anemia is likely multifactorial, could be due to CLL, anemia due to chronic kidney disease and IDA. Lab Results  Component Value Date   HGB 8.5 (L) 02/13/2024   TIBC 307 08/17/2023   IRONPCTSAT 15 (L) 08/17/2023   FERRITIN 24 08/17/2023   Colonoscopy findings were reviewed. No obvious source of bleeding.  Decrease of hemoglobin.  Will add on iron  panel.      Unintentional weight loss Unclear etiology. CLL has responded to treatments and currently on hold. October CT scan showed progression of lymphadenopathy, new lung nodules.  I will repeat CT chest for follow-up  Recommend patient to follow-up with nutritionist  No orders of the defined types were placed in this encounter.  Follow-up to be  determined All questions were answered. The patient knows to call the clinic with any problems, questions or concerns.  Call Babara, MD, PhD [facility] Hematology Oncology 02/13/2024    CHIEF COMPLAINTS/PURPOSE OF CONSULTATION:  CLL  HISTORY OF PRESENTING ILLNESS:  Paul Nadara Browner [name] 79 y.o. male presents for follow up of CLL I have reviewed his chart and materials related to his cancer extensively and collaborated history with the patient. Summary of oncologic history is as follows: Oncology History  CLL (chronic lymphocytic leukemia) (HCC)  12/25/2021 - 12/26/2021 Hospital Admission   Patient was admitted due to pain and swelling of right ankle, right knee and left wrist.  Patient was found to have WBC 30.4, with neutrophil 29% and lymphocyte 2%.  Peripheral smear showed leukocytosis with slight left shift in myeloid series and lymphocytosis with abnormal morphology.  Uric acid 5.5, worsening renal function with creatinine 1.35, BUN 18, GFR 55 (baseline creatinine 1.08 on 07/06/2021), lactic acid 1.1.  ESR normal, uric acid 5.5 normal, CRP 22.2, LDH 182  Orthopedic surgery was consulted s/p arthrocentesis, cell count 296 with 72% neutrophil, no crystal. Fluid negative for growth.  Patient received cefepime  and vancomycin  while in the hospital, Patient was discharged on Keflex  for 5 days.   12/28/2021 Cancer Staging   Staging form: Chronic Lymphocytic Leukemia / Small Lymphocytic Lymphoma, AJCC 8th Edition - Clinical stage from 12/28/2021: Modified Rai Stage III (Modified Rai risk: High, Lymphocytosis: Present, Adenopathy: Present, Organomegaly: Absent, Anemia: Present, Thrombocytopenia: Absent) - Signed by Babara Call, MD on 01/20/2022 Stage prefix: Initial diagnosis Hemoglobin (Hgb) (g/dL): 89.8   87/01/7974 Initial Diagnosis   CLL (chronic lymphocytic leukemia)   12/27/21 peripheral blood flowcytometry showed Involvement by CD5+, CD23+,  CD20+, CD22+ clonal B cell population, phenotype  typical for 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    01/05/2022 Imaging   CT chest abdomen pelvis wo contrast 1. Multiple prominent borderline enlarged and mildly enlarged lymph nodes, most evident in the low anatomic pelvis, as above, compatible with reported clinical history of CLL. 2. There also several small pulmonary nodules in the lungs measuring 5 mm or less in size. This is nonspecific, but statistically likely benign. No follow-up needed if patient is low-risk (and has no known or suspected primary neoplasm). Non-contrast chest CT can be considered in 12 months if patient is high-risk. This recommendation 3. Aortic atherosclerosis, in addition to left main and 2 vessel coronary artery disease. Please note that although the presence of coronary artery calcium  documents the presence of coronary artery disease, the severity of this disease and any potential stenosis cannot be assessed on this non-gated CT examination. Assessment for  potential risk factor modification, dietary therapy or pharmacologic therapy may be warranted, if clinically indicated. 4. There are calcifications of the aortic valve. Echocardiographic correlation for evaluation of potential valvular dysfunction may be warranted if clinically indicated. 5. 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 6. Diverticulosis without evidence of acute diverticulitis at this time. 7. Mild cardiomegaly.   08/26/2022 Imaging   CT abdomen pelvis w contrast  1. Subcutaneous fat stranding within the left lower quadrant anterior abdominal wall extending into the anterior proximal left thigh, likely related to contusion given history of recent trauma. No fluid collection or hematoma. 2. No acute displaced fracture. 3. Retroperitoneal and pelvic lymphadenopathy as above,  with waxing and waning appearance. 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 4. 1.9 cm hypodensity within the medial aspect of the spleen,nonspecific. Leukemic involvement of the spleen cannot be excluded. 5. Distal colonic diverticulosis without diverticulitis. 6.  Aortic Atherosclerosis    07/24/2023 Imaging   MRI cervical spine with and without contrast  1. Enhancing and indeterminate right C4 posterior element/facet bone lesion, which is superimposed on diffusely abnormal but nonspecific generalized decreased T1 marrow signal in the visible spine and at the skull base.   Marrow infiltrative processes such as myelofibrosis or lymphoma cannot be excluded. But the absence of other suspicious marrow edema or enhancement argues against metastatic disease or multiple myeloma. And furthermore similar generalized abnormal marrow signal can be caused by chronic anemia, smoking, obesity.   2. Underlying severe chronic cervical spine degeneration with reversal of lordosis and multifactorial degenerative cervical spinal stenosis at all levels. Associated moderate spinal stenosis and moderate (up to severe) spinal cord mass effect C3-C4 through C5-C6. No associated spinal cord edema or myelomalacia. Associated moderate or severe degenerative neural foraminal stenosis at the bilateral C4 through C8 nerve levels.       08/19/2023 Bone Marrow Biopsy   Bone marrow biopsy  aspirate clot core  - Hypercellular bone marrow (80%) involved by the patient's known  chronic lymphocytic leukemia at approximately 50% of the cellular  marrow.      08/23/2022 Colonoscopy showed - Likely malignant partially obstructing tumor in the distal ascending colon. Biopsied. Tattooed. - Three 4 to 6 mm polyps in the transverse colon and in the ascending colon, removed with a cold snare. Resected and retrieved. - Five 3 to 7 mm polyps in the rectum, in the descending colon, in the transverse colon and  in  the ascending colon, removed with a hot snare. Resected and retrieved. 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 - Non- bleeding internal hemorrhoids.  Pathology showed 1. Ascending Colon Polyp, x2 cold snare - TUBULAR ADENOMA(S) (MULTIPLE FRAGMENTS) - NEGATIVE FOR HIGH-GRADE DYSPLASIA OR MALIGNANCY 2. Ascending Colon Polyp, hot snare - TUBULAR ADENOMA (MULTIPLE FRAGMENTS) - NEGATIVE FOR HIGH-GRADE DYSPLASIA OR MALIGNANCY 3. Ascending Colon Biopsy, mass cbx - TUBULAR ADENOMA (MULTIPLE FRAGMENTS) - SEE NOTE 4. Transverse Colon Polyp, hot snare; cold snare - TUBULAR ADENOMA (MULTIPLE FRAGMENTS) - NEGATIVE FOR HIGH-GRADE DYSPLASIA OR MALIGNANCY 5. Descending Colon Polyp, x2 hot snare - TUBULAR ADENOMA(S) (MULTIPLE FRAGMENTS) - NEGATIVE FOR HIGH-GRADE DYSPLASIA OR MALIGNANCY 6. Rectum, polyp(s), hot snare - TUBULOVILLOUS ADENOMA (MULTIPLE FRAGMENTS) - NEGATIVE FOR HIGH-GRADE DYSPLASIA OR MALIGNANCY I&D Recent admission due to sepsis due to cellulitis, left groin abscess, s/p I&D  04/24/2023 s/p colonoscopy, removal of small polyps.  04/26/2023 CT chest wo contrast  Pulmonary infiltrates as described and left pleural effusion correlate with bronchopneumonia. Follow-up recommended as clinically needed. Small pericardial effusion, correlation with echocardiography recommended as clinically needed.  04/27/2023 CT chest PE angiogram  No definite evidence of pulmonary embolus. Small left pleural effusion is noted. Airspace opacities are noted in lingular segment of left upper lobe as well as in left lower lobe most consistent with pneumonia. Minimal pericardial effusion. Aortic Atherosclerosis   Recent hospitalization due to right knee pain due to septic prepatella bursitis of right knee. He was treated wit IV abx. S/p FNA aspiration, culture was negative.   08/19/2023, repeat bone marrow biopsy showed hypercellular bone marrow 80% involved by patient's known chronic lymphocytic leukemia at  approximately 50% of the cellular marrow.  INTERVAL HISTORY Paul Cheatum [name] is a 79 y.o. male who has above history reviewed by me today presents for follow up visit for CLL, iron  deficiency anemia, colon tubular adenoma with high-grade dysplastic status post resection.  Patient is currently on Zanubrutinib  160 mg twice daily. Patient recently reports feeling okay.  He has no appetite and only eat 1 meal per day.  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    MEDICAL HISTORY:  Past Medical History:  Diagnosis Date   Adenoma of left adrenal gland    Anemia    Aortic atherosclerosis    Blood transfusion without reported diagnosis    BPH (benign prostatic hyperplasia)    Bradycardia    CAD (coronary artery disease) 08/26/2022   a.) cCTA 08/26/2022: Ca2+ 14.3 (12th %'ile for age/sex.race match control); (<25%) pLAD   Cellulitis of left thigh 08/2022   Chronic pain syndrome    a.) on COT managed by pain management   Chronic, continuous use of opioids    a.) chronic pain syndrome/chronic back pain; managed by pain management   CKD (chronic kidney disease) stage 3, GFR 30-59 ml/min (HCC)    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   a.) Rai stage III   DDD (degenerative disc disease), cervical    Difficult airway 10/22/2015   a.) 1st attempt with glidescope  --> macroglossic (unsuccessful); b.) 2nd attempt with Mac 4 and direct laryngoscopy with a 8.0 ETT --> tube too large to pass through the cords; b.) 3rd attempt (successful) with a Mac 4 and a 7.5 tube   Diverticulosis    DM (diabetes mellitus), type 2 (HCC)    Dysplasia of prostate    Erectile dysfunction    Frequent falls    Heart failure with  mildly reduced ejection fraction (HFmrEF) (HCC)    a.) TTE 10/23/2015: EF 60-65%, mod-sev LVH, mild biatrial dil, degen MV disease, AoV sclerosis, asc Ao 38 mm, G1DD; b.) TTE 08/05/2022: EF 45-50%, mod-sev LVH (speckled pattern), sev biatrial dil, mild  MR, AoV sclerosis, G1DD   Hepatic flexure mass 08/23/2022   a.) colonoscopy 08/23/2022: 14 x 15 mm partially obstructing ulcerated distal ascending colon mass; tissue friable --> pathology resulted as tubular adenoma, however felt to be false (-) --> referral to sugery and colectomy recommended; b.) s/p RIGHT hemicolectomy 09/28/2022   History of MRSA infection 08/28/2022   a.) MRSA PCR (+) 08/28/2022; culture from LEFT thigh abscess   HTN (hypertension)    Hyperlipidemia    Hypogonadism in male    Hypothyroidism    IDA (iron  deficiency anemia)    LBBB (left bundle branch block)    Long-term use of aspirin  therapy    Lumbar spinal stenosis    Mild cardiomegaly    Multiple lung nodules on CT    NICM (nonischemic cardiomyopathy) (HCC)    a.) TTE 10/23/2015: EF 60-65; b.) TTE 08/05/2022: EF 45-50%   Orchitis and epididymitis 06/16/2013   OSA on CPAP    Palindromic rheumatism, hand    Pericardial effusion    Prostatitis    Sepsis (HCC) 08/2022   Subdural hematoma (HCC) 10/22/2015   a.) s/p traumatic mechanical fall --> CT head 10/22/2015: high-density SDH the left cerebral convexity with13 mm midline shift --> s/p LEFT frontal burr hole craniotomy   Tubular adenoma of colon    Ventral hernia     SURGICAL HISTORY: Past Surgical History:  Procedure Laterality Date   BACK SURGERY     BIOPSY  08/21/2022   Procedure: BIOPSY;  Surgeon: Onita Elspeth Sharper, DO;  Location[facility] ENDOSCOPY;  Service: Gastroenterology;;   BIOPSY  08/23/2022   Procedure: BIOPSY;  Surgeon: Onita Elspeth Sharper, DO;  Location: [facility] ENDOSCOPY;  Service: Gastroenterology;;   BIOPSY  02/28/2023   Procedure: BIOPSY;  Surgeon: Onita Elspeth Sharper, DO;  Location: [facility] ENDOSCOPY;  Service: Gastroenterology;;   SOLMON DAKIN OF CRANIUM Left 10/22/2015   COLON SURGERY  09/28/2022   COLONOSCOPY WITH PROPOFOL  N/A 08/23/2022   Procedure: COLONOSCOPY WITH PROPOFOL ;  Surgeon: Onita Elspeth Sharper, DO;  Location: [facility]  ENDOSCOPY;  Service: Gastroenterology;  Laterality: N/A;   COLONOSCOPY WITH PROPOFOL  N/A 02/28/2023   Procedure: COLONOSCOPY WITH PROPOFOL ;  Surgeon: Onita Elspeth Sharper, DO;  Location: [facility] ENDOSCOPY;  Service: Gastroenterology;  Laterality: N/A;  DM   COLONOSCOPY WITH PROPOFOL  N/A 04/25/2023   Procedure: COLONOSCOPY WITH PROPOFOL ;  Surgeon: Onita Elspeth Sharper, DO;  Location: [facility] ENDOSCOPY;  Service: Gastroenterology;  Laterality: N/A;  DM   ESOPHAGOGASTRODUODENOSCOPY (EGD) WITH PROPOFOL  N/A 08/21/2022   Procedure: ESOPHAGOGASTRODUODENOSCOPY (EGD) WITH PROPOFOL ;  Surgeon: Onita Elspeth Sharper, DO;  Location: [facility] ENDOSCOPY;  Service: Gastroenterology;  Laterality: N/A;   ESOPHAGOGASTRODUODENOSCOPY (EGD) WITH PROPOFOL  N/A 02/28/2023   Procedure: ESOPHAGOGASTRODUODENOSCOPY (EGD) WITH PROPOFOL ;  Surgeon: Onita Elspeth Sharper, DO;  Location: [facility] ENDOSCOPY;  Service: Gastroenterology;  Laterality: N/A;   GIVENS CAPSULE STUDY N/A 08/23/2022   Procedure: GIVENS CAPSULE STUDY;  Surgeon: Onita Elspeth Sharper, DO;  Location: [facility] ENDOSCOPY;  Service: Gastroenterology;  Laterality: N/A;   HERNIA REPAIR     INGUINAL HERNIA REPAIR Right 03/09/2023   Procedure: HERNIA REPAIR INGUINAL ADULT, open, RNFA to assist;  Surgeon: Jordis Laneta FALCON, MD;  Location: [facility] ORS;  Service: General;  Laterality: Right;   IR BONE MARROW BIOPSY &  ASPIRATION  08/19/2023   IR US  GUIDE BX ASP/DRAIN  06/27/2023   IRRIGATION AND DEBRIDEMENT ABSCESS Left 08/28/2022   Procedure: IRRIGATION AND DEBRIDEMENT ABSCESS LEFT UPPER THIGH/GROIN;  Surgeon: Jordis Laneta FALCON, MD;  Location: [facility] ORS;  Service: General;  Laterality: Left;   LAPAROSCOPIC RIGHT COLECTOMY Right 09/28/2022   Procedure: LAPAROSCOPIC RIGHT COLECTOMY, RNFA to assist;  Surgeon: Jordis Laneta FALCON, MD;  Location: [facility] ORS;  Service: General;  Laterality: Right;   POLYPECTOMY  08/23/2022   Procedure: POLYPECTOMY;  Surgeon: Onita Elspeth Sharper, DO;  Location: [facility]  ENDOSCOPY;  Service: Gastroenterology;;   POLYPECTOMY  04/25/2023   Procedure: POLYPECTOMY;  Surgeon: Onita Elspeth Sharper, DO;  Location: [facility] ENDOSCOPY;  Service: Gastroenterology;;   SUBMUCOSAL TATTOO INJECTION  08/23/2022   Procedure: SUBMUCOSAL TATTOO INJECTION;  Surgeon: Onita Elspeth Sharper, DO;  Location: [facility] ENDOSCOPY;  Service: Gastroenterology;;   TONSILLECTOMY     VENTRAL HERNIA REPAIR N/A 09/28/2022   Procedure: HERNIA REPAIR VENTRAL ADULT;  Surgeon: Jordis Laneta FALCON, MD;  Location: [facility] ORS;  Service: General;  Laterality: N/A;    SOCIAL HISTORY: Social History   Socioeconomic History   Marital status: Married    Spouse name: Not on file   Number of children: Not on file   Years of education: Not on file   Highest education level: 5th grade  Occupational History   Not on file  Tobacco Use   Smoking status: Former    Types: Cigarettes    Passive exposure: Past   Smokeless tobacco: Never   Tobacco comments:    Stop smoking iver 40  plus years ago  Vaping Use   Vaping status: Never Used  Substance and Sexual Activity   Alcohol use: No   Drug use: No   Sexual activity: Not Currently  Other Topics Concern   Not on file  Social History Narrative   Not on file   Social Drivers of Health   Tobacco Use: Medium Risk (02/13/2024)   Patient History    Smoking Tobacco Use: Former    Smokeless Tobacco Use: Never    Passive Exposure: Past  Physicist, Medical Strain: Patient Declined (04/26/2023)   Received from [facility]   Overall Financial Resource Strain (CARDIA)    Difficulty of Paying Living Expenses: Patient declined  Recent Concern: Financial Resource Strain - Medium Risk (02/24/2023)   Received from [facility]   Overall Financial Resource Strain (CARDIA)    Difficulty of Paying Living Expenses: Somewhat hard  Food Insecurity: No Food Insecurity (11/21/2023)   Epic    Worried About Running Out of Food in the Last Year:  Never true    Ran Out of Food in the Last Year: Never true  Transportation Needs: No Transportation Needs (11/21/2023)   Epic    Lack of Transportation (Medical): No    Lack of Transportation (Non-Medical): No  Physical Activity: Unknown (07/26/2022)   Exercise Vital Sign    Days of Exercise per Week: Patient declined    Minutes of Exercise per Session: Not on file  Stress: Stress Concern Present (07/26/2022)   [employer] of Occupational Health - Occupational Stress Questionnaire    Feeling of Stress : To some extent  Social Connections: Moderately Integrated (11/21/2023)   Social Connection and Isolation Panel    Frequency of Communication with Friends and Family: Three times a week    Frequency of Social Gatherings with Friends and Family: Once a week    Attends Religious Services:  More than 4 times per year    Active Member of Clubs or Organizations: No    Attends Banker Meetings: Never    Marital Status: Living with partner  Intimate Partner Violence: Not At Risk (11/21/2023)   Epic    Fear of Current or Ex-Partner: No    Emotionally Abused: No    Physically Abused: No    Sexually Abused: No  Depression (PHQ2-9): Low Risk (11/01/2023)   Depression (PHQ2-9)    PHQ-2 Score: 0  Alcohol Screen: Low Risk (01/22/2022)   Alcohol Screen    Last Alcohol Screening Score (AUDIT): 1  Housing: Low Risk  (02/10/2024)   Received from [facility]   Epic    In the last 12 months, was there a time when you were not able to pay the mortgage or rent on time?: No    In the past 12 months, how many times have you moved where you were living?: 0    At any time in the past 12 months, were you homeless or living in a shelter (including now)?: No  Utilities: Not At Risk (11/21/2023)   Epic    Threatened with loss of utilities: No  Health Literacy: Not on file    FAMILY HISTORY: Family History  Problem Relation Age of Onset   Cancer Maternal Aunt    ADD /  ADHD Daughter    Asthma Son    Prostate cancer Neg Hx    Kidney disease Neg Hx    Kidney cancer Neg Hx    Bladder Cancer Neg Hx     ALLERGIES:  has no known allergies.  MEDICATIONS:  Current Outpatient Medications  Medication Sig Dispense Refill   acetaminophen  (TYLENOL ) 325 MG tablet Take 2 tablets (650 mg total) by mouth every 6 (six) hours as needed for mild pain (pain score 1-3) or fever.     albuterol  (VENTOLIN  HFA) 108 (90 Base) MCG/ACT inhaler Inhale 2 puffs into the lungs every 4 (four) hours as needed for wheezing or shortness of breath. 1 each 2   aspirin  EC 81 MG tablet Take 1 tablet (81 mg total) by mouth daily. Swallow whole.     DM-Doxylamine-Acetaminophen  (NYQUIL COLD & FLU PO) Take by mouth as needed.     empagliflozin  (JARDIANCE ) 10 MG TABS tablet Take 1 tablet (10 mg total) by mouth daily before breakfast. 90 tablet 2   furosemide  (LASIX ) 40 MG tablet Take 1 tablet (40 mg total) by mouth daily. 30 tablet 0   hydrALAZINE  (APRESOLINE ) 50 MG tablet Take 2 tablets by mouth twice daily 180 tablet 0   levothyroxine  (SYNTHROID ) 200 MCG tablet Take 1 tablet (200 mcg total) by mouth daily before breakfast. 90 tablet 0   metFORMIN  (GLUCOPHAGE ) 500 MG tablet Take 2 tablets (1,000 mg total) by mouth 2 (two) times daily with a meal. 120 tablet 0   metoprolol  succinate (TOPROL  XL) 25 MG 24 hr tablet Take 1 tablet (25 mg total) by mouth daily. 90 tablet 1   mirtazapine  (REMERON ) 7.5 MG tablet Take 7.5 mg by mouth at bedtime.     naloxone  (NARCAN ) nasal spray 4 mg/0.1 mL Place 1 spray into the nose as needed for up to 365 doses (for opioid-induced respiratory depresssion). In case of emergency (overdose), spray once into each nostril. If no response within 3 minutes, repeat application and call 911. 1 each 1   oxyCODONE -acetaminophen  (PERCOCET) 10-325 MG tablet Take 1 tablet by mouth every 8 (eight) hours  as needed for pain. Must last 30 days. 90 tablet 0   [START ON 02/20/2024]  oxyCODONE -acetaminophen  (PERCOCET) 10-325 MG tablet Take 1 tablet by mouth every 8 (eight) hours as needed for pain. Must last 30 days. 90 tablet 0   rosuvastatin  (CRESTOR ) 40 MG tablet Take 1 tablet (40 mg total) by mouth daily. 90 tablet 1   spironolactone  (ALDACTONE ) 25 MG tablet Take 1 tablet (25 mg total) by mouth daily. 90 tablet 3   zanubrutinib  (BRUKINSA ) 160 MG tablet Take 1 tablet (160 mg total) by mouth 2 (two) times daily. 60 tablet 0   No current facility-administered medications for this visit.    Review of Systems  Constitutional:  Positive for fatigue. Negative for appetite change, chills, fever and unexpected weight change.  HENT:   Negative for hearing loss and voice change.   Eyes:  Negative for eye problems and icterus.  Respiratory:  Negative for chest tightness, cough and shortness of breath.   Cardiovascular:  Negative for chest pain and leg swelling.  Gastrointestinal:  Negative for abdominal distention and abdominal pain.  Endocrine: Negative for hot flashes.  Genitourinary:  Negative for difficulty urinating, dysuria and frequency.   Musculoskeletal:  Positive for arthralgias.  Skin:  Negative for itching and rash.  Neurological:  Negative for light-headedness and numbness.  Hematological:  Negative for adenopathy. Does not bruise/bleed easily.  Psychiatric/Behavioral:  Negative for confusion.      PHYSICAL EXAMINATION: ECOG PERFORMANCE STATUS: 1 - Symptomatic but completely ambulatory  Vitals:   02/13/24 1555 02/13/24 1600  BP: (!) 162/80 (!) 160/71  Resp: (!) 97   Temp: (!) 96 F (35.6 C)    Filed Weights   02/13/24 1555  Weight: 210 lb 8 oz (95.5 kg)    Physical Exam Constitutional:      General: He is not in acute distress.    Appearance: He is obese. He is not diaphoretic.  HENT:     Head: Normocephalic and atraumatic.  Eyes:     General: No scleral icterus. Cardiovascular:     Rate and Rhythm: Normal rate.  Pulmonary:     Effort:  Pulmonary effort is normal. No respiratory distress.     Breath sounds: No wheezing.  Abdominal:     General: There is no distension.     Palpations: Abdomen is soft.  Genitourinary:    Comments:   Musculoskeletal:     Cervical back: Normal range of motion and neck supple.  Skin:    General: Skin is warm and dry.     Findings: No erythema.  Neurological:     Mental Status: He is alert and oriented to person, place, and time. Mental status is at baseline.     Cranial Nerves: No cranial nerve deficit.     Motor: No abnormal muscle tone.  Psychiatric:        Mood and Affect: Mood and affect normal.        LABORATORY DATA:  I have reviewed the data as listed    Latest Ref Rng & Units 02/13/2024    3:44 PM 12/29/2023   10:15 AM 12/13/2023   10:27 AM  CBC  WBC 4.0 - 10.5 K/uL 18.9  14.0  17.9   Hemoglobin 13.0 - 17.0 g/dL 8.5  89.8  89.5   Hematocrit 39.0 - 52.0 % 27.5  32.1  34.1   Platelets 150 - 400 K/uL 152  166  138       Latest Ref Rng &  Units 02/13/2024    3:44 PM 12/29/2023   10:15 AM 12/13/2023   10:27 AM  CMP  Glucose 70 - 99 mg/dL 95  883  82   BUN 8 - 23 mg/dL 19  25  17    Creatinine 0.61 - 1.24 mg/dL 8.70  8.51  8.60   Sodium 135 - 145 mmol/L 143  142  140   Potassium 3.5 - 5.1 mmol/L 4.0  4.9  3.8   Chloride 98 - 111 mmol/L 108  106  106   CO2 22 - 32 mmol/L 26  28  24    Calcium  8.9 - 10.3 mg/dL 9.1  9.6  8.6   Total Protein 6.5 - 8.1 g/dL 7.0  6.7  6.8   Total Bilirubin 0.0 - 1.2 mg/dL 0.5  0.5  1.0   Alkaline Phos 38 - 126 U/L 77  79  69   AST 15 - 41 U/L 42  34  25   ALT 0 - 44 U/L 26  18  18       RADIOGRAPHIC STUDIES: I have personally reviewed the radiological images as listed and agreed with the findings in the report. DG Hip Unilat W or Wo Pelvis 2-3 Views Right Result Date: 02/12/2024 EXAM: 2 or 3 VIEW(S) XRAY OF THE RIGHT HIP 02/12/2024 04:02:40 AM COMPARISON: None available. CLINICAL HISTORY: fall, fall FINDINGS: BONES AND JOINTS: No acute  fracture. No malalignment. SOFT TISSUES: There are moderate vascular calcifications present. IMPRESSION: 1. No acute fracture or dislocation in the right hip or visualized pelvis. 2. Moderate vascular calcifications. Electronically signed by: Evalene Coho MD 02/12/2024 04:36 AM EST RP Workstation: [id number]   DG Knee Complete 4 Views Right Result Date: 02/12/2024 EXAM: 4 OR MORE VIEW(S) XRAY OF THE RIGHT KNEE 02/12/2024 04:02:40 AM COMPARISON: None available. CLINICAL HISTORY: fall fall fall FINDINGS: BONES AND JOINTS: There is no evidence of acute traumatic injury. There is calcification of the medial and lateral menisci. There is spurring on the ventral surface of the patella at the insertion site of the quadriceps tendon. No significant joint effusion. SOFT TISSUES: There are vascular calcifications also present. IMPRESSION: 1. No evidence of acute traumatic injury. 2. Calcification of the medial and lateral menisci. 3. Spurring on the ventral surface of the patella at the insertion site of the quadriceps tendon. Electronically signed by: Evalene Coho MD 02/12/2024 04:35 AM EST RP Workstation: [id number]   DG Shoulder Right Result Date: 02/12/2024 EXAM: 1 VIEW(S) XRAY OF THE RIGHT SHOULDER 02/12/2024 04:02:40 AM COMPARISON: Right shoulder series dated 07/08/2023. CLINICAL HISTORY: fall FINDINGS: BONES AND JOINTS: Glenohumeral joint is normally aligned. No acute fracture. No malalignment. There is loss of the acromiohumeral interval. Widening of the acromioclavicular joint which also demonstrates moderate spurring. 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 There is also a smooth erosion of the medial junction of the humeral head and neck. SOFT TISSUES: No abnormal calcifications. Visualized lung is unremarkable. IMPRESSION: 1. No acute fracture or dislocation. 2. 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 Electronically signed by: Evalene Coho MD 02/12/2024 04:34 AM EST RP Workstation: [id number]   CT Cervical Spine Wo Contrast Result Date: 02/12/2024 EXAM: CT CERVICAL SPINE WITHOUT CONTRAST 02/12/2024 04:06:33 AM TECHNIQUE: CT of the cervical spine was performed without the administration of intravenous contrast. Multiplanar reformatted images are provided for review. Automated exposure control, iterative  reconstruction, and/or weight based adjustment of the mA/kV was utilized to reduce the radiation dose to as low as reasonably achievable. COMPARISON: CT of the cervical spine dated 07/08/2023. CLINICAL HISTORY: fall fall FINDINGS: BONES AND ALIGNMENT: Reversal of the normal cervical lordosis. Mild hypertrophic changes are again demonstrated within the atlantoaxial joint. A circumscribed lucent lesion within the right pars articularis of C3 has mildly decreased in size in the interim from 10 mm in long axis to 9 mm. There is no definite evidence of acute traumatic injury. DEGENERATIVE CHANGES: Moderate disc space narrowing and endplate ridging are again demonstrated throughout the cervical spine. There is mild-to-moderate central spinal canal stenosis present throughout the cervical spine secondary to degenerative disc bulging and endplate ridging. SOFT TISSUES: No prevertebral soft tissue swelling. There is calcific atheromatous disease within the carotid bulbs bilaterally. IMPRESSION: 1. No definite evidence of acute traumatic injury. 2. Reversal of the normal cervical lordosis with multilevel cervical spondylosis and mild-to-moderate central spinal canal stenosis throughout the cervical spine secondary to degenerative disc bulging and endplate ridging. 3. Circumscribed early likely benign lucent lesion within the right pars interarticularis of C3 is slightly decreased in size (10 mm to 9 mm). 4.  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 Electronically signed by: Evalene Coho MD 02/12/2024 04:30 AM EST RP Workstation: [id number]   CT Head Wo Contrast Result Date: 02/12/2024 EXAM: CT HEAD WITHOUT CONTRAST 02/12/2024 04:06:33 AM TECHNIQUE: CT of the head was performed without the administration of intravenous contrast. Automated exposure control, iterative reconstruction, and/or weight based adjustment of the mA/kV was utilized to reduce the radiation dose to as low as reasonably achievable. COMPARISON: 07/08/2023 CLINICAL HISTORY: fall fall fall fall fall FINDINGS: BRAIN AND VENTRICLES: No acute hemorrhage. No evidence of acute infarct. No hydrocephalus. No extra-axial collection. No mass effect or midline shift. Atherosclerotic calcifications within the cavernous internal carotid arteries. ORBITS: No acute abnormality. SINUSES: No acute abnormality. SOFT TISSUES AND SKULL: Left frontal craniotomy. No acute soft tissue abnormality. No skull fracture. IMPRESSION: 1. No acute intracranial abnormality related to the fall. 2. Atherosclerotic calcifications within the cavernous internal carotid arteries. 3. Left frontal craniotomy. Electronically signed by: Evalene Coho MD 02/12/2024 04:23 AM EST RP Workstation: [id number]       "

## 2024-02-13 NOTE — Assessment & Plan Note (Addendum)
 Labs are reviewed and discussed with patient. CT chest abdomen pelvis results reviewed and discussed with patient.   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  50% of bone marrow is involved with CLL.  Labs are reviewed and discussed with patient.  Lymphocytosis is getting worse.  Clinically he does not have signs of acute infection. Possible progression.  Consider switching to noncoherent BTK inhibitor pirtobrutinib .  Will check insurance approval.

## 2024-02-13 NOTE — Assessment & Plan Note (Signed)
 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 Lab Results  Component Value Date   HGB 8.5 (L) 02/13/2024   TIBC 307 08/17/2023   IRONPCTSAT 15 (L) 08/17/2023   FERRITIN 24 08/17/2023   Colonoscopy findings were reviewed. No obvious source of bleeding.  Decrease of hemoglobin.  Will add on iron  panel.

## 2024-02-13 NOTE — Assessment & Plan Note (Signed)
 Continue treatment as mentioned above.

## 2024-02-13 NOTE — Assessment & Plan Note (Addendum)
 Unclear etiology. CLL has responded to treatments and currently on hold. October CT scan showed progression of lymphadenopathy, new lung nodules.  I will repeat CT chest for follow-up  Recommend patient to follow-up with nutritionist

## 2024-02-14 ENCOUNTER — Telehealth: Payer: Self-pay | Admitting: Pharmacy Technician

## 2024-02-14 ENCOUNTER — Other Ambulatory Visit: Payer: Self-pay

## 2024-02-14 ENCOUNTER — Telehealth: Payer: Self-pay

## 2024-02-14 ENCOUNTER — Other Ambulatory Visit: Payer: Self-pay | Admitting: Family Medicine

## 2024-02-14 ENCOUNTER — Telehealth: Payer: Self-pay | Admitting: Pharmacist

## 2024-02-14 ENCOUNTER — Other Ambulatory Visit (HOSPITAL_COMMUNITY): Payer: Self-pay

## 2024-02-14 ENCOUNTER — Encounter: Payer: Self-pay | Admitting: Oncology

## 2024-02-14 DIAGNOSIS — C9110 Chronic lymphocytic leukemia of B-cell type not having achieved remission: Secondary | ICD-10-CM

## 2024-02-14 DIAGNOSIS — R1031 Right lower quadrant pain: Secondary | ICD-10-CM

## 2024-02-14 LAB — FERRITIN: Ferritin: 23 ng/mL — ABNORMAL LOW (ref 24–336)

## 2024-02-14 LAB — IRON AND TIBC
Iron: 27 ug/dL — ABNORMAL LOW (ref 45–182)
Saturation Ratios: 8 % — ABNORMAL LOW (ref 17.9–39.5)
TIBC: 351 ug/dL (ref 250–450)
UIBC: 324 ug/dL

## 2024-02-14 LAB — RETIC PANEL
Immature Retic Fract: 17.2 % — ABNORMAL HIGH (ref 2.3–15.9)
RBC.: 3.14 MIL/uL — ABNORMAL LOW (ref 4.22–5.81)
Retic Count, Absolute: 29.2 K/uL (ref 19.0–186.0)
Retic Ct Pct: 0.9 % (ref 0.4–3.1)
Reticulocyte Hemoglobin: 24.5 pg — ABNORMAL LOW

## 2024-02-14 MED ORDER — PIRTOBRUTINIB 100 MG PO TABS
200.0000 mg | ORAL_TABLET | Freq: Every day | ORAL | 1 refills | Status: AC
  Filled 2024-02-22: qty 60, 30d supply, fill #0

## 2024-02-14 NOTE — Telephone Encounter (Signed)
 Oral Oncology Patient Advocate Encounter  Was successful in securing patient a $4500 grant from [employer] to provide copayment coverage for Jaypirca .  This will keep the out of pocket expense at $0.     The billing information is as follows and has been shared with [facility].    RxBin: [id number] PCN: [id number] Member ID: [id number] Group ID: [id number] Dates of Eligibility: 11/16/2023 through 02/13/2025  Fund:  CLL    Kees Idrovo (Patty) Chet Burnet, CPhT  [facility], [place] [place], [facility] - Oral Chemotherapy Patient Advocate Specialist III Phone: [phone]  Fax: [phone]

## 2024-02-14 NOTE — Progress Notes (Signed)
Message sent to lab °

## 2024-02-14 NOTE — Progress Notes (Signed)
 "  Labs added  "

## 2024-02-14 NOTE — Telephone Encounter (Signed)
 Message Dr. Babara on 02/14/2024 about drug interaction with rosuvastatin .  She would like for his rosuvastatin  to be lowered before starting pirtobrutinib .     Placed call the patient's internal medicine office, as his rosuvastatin  is being prescribed by Selinda Quan, PA.  Left message for provider/provider nurse detailing the drug interaction and the request to decrease patient's rosuvastatin  dose.  Awaiting returned call.

## 2024-02-14 NOTE — Telephone Encounter (Signed)
 Clinical Pharmacist Practitioner Encounter   Received new prescription for Jaypirca  (pirtobrutinib ) for the treatment of progressive CLL, planned duration until disease progression or unacceptable drug toxicity.  CMP from 02/13/24 assessed, SCr 1.29, CrCl ~27ml/min no renal dose adjustment indicated at this time. Prescription dose and frequency assessed.   Current medication list in Epic reviewed, one DDIs with pirtobrutinib  identified: Rosuvastatin : Category C (monitor therapy), Pirtobrutinib  may increase the serum concentration of rosuvastatin .  Monitor for increased rosuvastatin  toxicities (myalgias, rhabdomyolysis).  Appropriate to monitor for adverse events or consider using lower rosuvastatin  doses to avoid toxicities.  Will discuss interaction with Dr. Babara  Evaluated chart and no patient barriers to medication adherence identified.   Prescription has been e-scribed to the [facility] for benefits analysis and approval.  Oral Oncology Clinic will continue to follow for insurance authorization, copayment issues, initial counseling and start date.   Raela Bohl N. Khadija Thier, PharmD, BCOP, CPP Hematology/Oncology Clinical Pharmacist [facility] Oral Chemotherapy Navigation Clinic [phone]  02/14/2024 9:06 AM

## 2024-02-14 NOTE — Telephone Encounter (Signed)
 Per Dr.Yu : Please arrange him to repeat CT chest wo contrast. - CLL.  Advise him to stop Zanubrutinib .  Called pt and left VM on ph letting him know to stop Zanubrutinib  and that he will need a CT.   Please schedule and notify pt of appt:   CT chest wo

## 2024-02-14 NOTE — Telephone Encounter (Signed)
 Oral Oncology Patient Advocate Encounter  After completing a benefits investigation, prior authorization for Jaypirca  is not required at this time through [employer].  Patient's copay is $1,799.52.     Timohty Renbarger (Patty) Chet Burnet, CPhT  [facility], [facility] [place], [facility] - Oral Chemotherapy Patient Advocate Specialist III Phone: [phone]  Fax: [phone]

## 2024-02-15 ENCOUNTER — Encounter: Payer: Self-pay | Admitting: Oncology

## 2024-02-16 ENCOUNTER — Telehealth: Payer: Self-pay | Admitting: Oncology

## 2024-02-16 ENCOUNTER — Ambulatory Visit: Admission: RE | Admit: 2024-02-16 | Source: Ambulatory Visit

## 2024-02-16 ENCOUNTER — Encounter: Payer: Self-pay | Admitting: Oncology

## 2024-02-16 NOTE — Telephone Encounter (Signed)
 Called pt to sched CT - pt confirmed date/time/location - [facility]

## 2024-02-17 ENCOUNTER — Ambulatory Visit: Payer: Self-pay | Admitting: Oncology

## 2024-02-17 ENCOUNTER — Encounter: Payer: Self-pay | Admitting: Oncology

## 2024-02-17 ENCOUNTER — Other Ambulatory Visit: Payer: Self-pay

## 2024-02-17 ENCOUNTER — Other Ambulatory Visit (HOSPITAL_COMMUNITY): Payer: Self-pay

## 2024-02-17 NOTE — Telephone Encounter (Signed)
-----   Message from Zelphia Cap, MD sent at 02/17/2024  2:01 PM EST ----- Please let him know his iron  level is low please arrange him to get IV Venfoer weekly x 4. thanks

## 2024-02-17 NOTE — Telephone Encounter (Signed)
 Called pt, no answer. Detailed message left for pt to call back and set up appts:   IV venofer  weekly x4

## 2024-02-17 NOTE — Telephone Encounter (Signed)
 Ct scheduled on 1/27

## 2024-02-20 ENCOUNTER — Other Ambulatory Visit: Payer: Self-pay

## 2024-02-20 ENCOUNTER — Encounter: Payer: Self-pay | Admitting: Pharmacist

## 2024-02-20 ENCOUNTER — Ambulatory Visit
Admission: RE | Admit: 2024-02-20 | Discharge: 2024-02-20 | Disposition: A | Discharge status: Home / Self Care | Source: Ambulatory Visit | Attending: Family Medicine | Admitting: Family Medicine

## 2024-02-20 DIAGNOSIS — M25512 Pain in left shoulder: Secondary | ICD-10-CM | POA: Diagnosis present

## 2024-02-20 DIAGNOSIS — G8929 Other chronic pain: Secondary | ICD-10-CM | POA: Diagnosis present

## 2024-02-21 ENCOUNTER — Other Ambulatory Visit: Payer: Self-pay

## 2024-02-21 ENCOUNTER — Ambulatory Visit
Admission: RE | Admit: 2024-02-21 | Discharge: 2024-02-21 | Disposition: A | Discharge status: Home / Self Care | Source: Ambulatory Visit | Attending: Oncology | Admitting: Oncology

## 2024-02-21 ENCOUNTER — Telehealth: Payer: Self-pay | Admitting: Oncology

## 2024-02-21 DIAGNOSIS — C9110 Chronic lymphocytic leukemia of B-cell type not having achieved remission: Secondary | ICD-10-CM | POA: Insufficient documentation

## 2024-02-21 NOTE — Telephone Encounter (Signed)
 Pt called and left vm stating that he needed to r/s his MRI for yesterday - checked and pt MRI appt is arrived - called pt back and asked pt to give us  a call if he needed to r/s anything else - left appt info as an extra reminder for his CT today - LH

## 2024-02-22 ENCOUNTER — Other Ambulatory Visit: Payer: Self-pay

## 2024-02-22 ENCOUNTER — Encounter: Payer: Self-pay | Admitting: Oncology

## 2024-02-22 ENCOUNTER — Other Ambulatory Visit: Payer: Self-pay | Admitting: Pharmacy Technician

## 2024-02-22 ENCOUNTER — Telehealth: Payer: Self-pay | Admitting: Pharmacist

## 2024-02-22 DIAGNOSIS — C9110 Chronic lymphocytic leukemia of B-cell type not having achieved remission: Secondary | ICD-10-CM

## 2024-02-22 NOTE — Patient Instructions (Signed)
 [facility] - A DEPT OF [facility]    Thank you for choosing [facility] to provide your oncology/hematology care and for allowing us  to participate in your care today!  As a reminder, we spoke about the following today: Jaypirca  (pirtobrutinib ) for the treatment of progressive CLL, planned duration until disease progression or unacceptable drug toxicity.   Treatment goal: Control  Medication handout has been provided.   **For oral cancer medication prescription refill requests, contact your pharmacy and they will contact our office if needed. Allow 5-7 days for refills to be completed by your specialty pharmacy.    Cancer Center General Instructions:  If you have an appointment at the [facility], please go directly to the Cancer Center and check in at the registration area.  We strive to give you quality time with your provider. You may need to reschedule your appointment if you arrive late (15 or more minutes).  Arriving late affects you and other patients whose appointments are after yours.  Also, if you miss three or more appointments without notifying the office, you may be dismissed from the clinic at the provider's discretion.      BELOW ARE SYMPTOMS THAT SHOULD BE REPORTED IMMEDIATELY: *FEVER GREATER THAN 100.4 F (38 C) OR HIGHER *CHILLS OR SWEATING *NAUSEA AND VOMITING THAT IS NOT CONTROLLED WITH YOUR NAUSEA MEDICATION *UNUSUAL SHORTNESS OF BREATH *UNUSUAL BRUISING OR BLEEDING *URINARY PROBLEMS (pain or burning when urinating, or frequent urination) *BOWEL PROBLEMS (unusual diarrhea, constipation, pain near the anus) TENDERNESS IN MOUTH AND THROAT WITH OR WITHOUT PRESENCE OF ULCERS (sore throat, sores in mouth, or a toothache) UNUSUAL RASH, SWELLING OR PAIN  UNUSUAL VAGINAL DISCHARGE OR ITCHING   Items with * indicate a potential emergency and should be followed up as soon as possible or go to the Emergency Department if  any problems should occur.  Please show the CHEMOTHERAPY ALERT CARD at check-in to the Emergency Department and triage nurse.  Should you have questions after your visit or need to cancel or reschedule your appointment, please contact [facility] - A DEPT OF [facility]  [phone] and follow the prompts.  Office hours are 8:00 a.m. to 4:30 p.m. Monday - Friday. Please note that voicemails left after 4:00 p.m. may not be returned until the following business day.  We are closed weekends and major holidays. You have access to a nurse at all times for urgent questions. Please call the main number to the clinic [phone] and follow the prompts.  For any non-urgent questions, you may also contact your provider using MyChart. We now offer e-Visits for anyone 13 and older to request care online for non-urgent symptoms. For details visit [email or web address].   Also download the MyChart app! Go to the app store, search MyChart, open the app, select [facility], and log in with your MyChart username and password.

## 2024-02-22 NOTE — Progress Notes (Signed)
 Patient education documented in EPIC note on 02/22/24.

## 2024-02-22 NOTE — Progress Notes (Signed)
 Clinical Pharmacist Practitioner Encounter   [facility] (Specialty) will deliver medication to patient on 02/24/2024.  Patient knows not to start until after his appointment with Dr. Babara, if given the go ahead.   Patient Education I spoke with patient's Ronal for overview of new oral chemotherapy medication: Jaypirca  (pirtobrutinib ) for the treatment of progressive CLL, planned duration until disease progression or unacceptable drug toxicity.   Treatment goal: Control  Counseled Mary on administration, dosing, side effects, monitoring, drug-food interactions, safe handling, storage, and disposal. Patient will take 2 tablets (200 mg total) by mouth daily.   Ronal was informed of Dr. Layvonne instruction to stop Brukinsa .  Ronal was also informed of the drug interaction with rosuvastatin , and the need to dose reduced to rosuvastatin  20 mg daily.  She knows this dose change was discussed with patient's primary care office and the primary care office sent the new prescription to patient's local pharmacy.  Ronal was unsure if Mr. Espinola has picked up the new dose, but stated she would check with him.  Side effects include but not limited to: decreased wbc/hgb/plt, changes in kidney function.    Reviewed with Ronal importance of keeping a medication schedule and plan for any missed doses.  After discussion with Ronal no patient barriers to medication adherence identified.   Distress evaluation: Distress thermometer not completed during telephone call as patient has been on previous lines of therapy.    Communication and Learning Assessment Primary learner: Mary  Barriers to learning: No barriers Preferred language: English Learning preferences: Listening Reading   Ronal voiced understanding and appreciation. All questions answered. Medication handout provided.  Provided [facility] with Oral Chemotherapy Navigation Clinic phone number. Ronal knows to call the office with questions or concerns.  Reviewed with Ronal the expectations for rescheduling or cancelling appointments.  Oral Chemotherapy Navigation Clinic will continue to follow.  Jermine Bibbee N. Anastazia Creek, PharmD, BCOP, CPP Hematology/Oncology Clinical Pharmacist [facility] Oral Chemotherapy Navigation Clinic [phone]  02/22/2024 10:57 AM

## 2024-02-22 NOTE — Progress Notes (Signed)
 Specialty Pharmacy Initial Fill Coordination Note  Nasean Zapf. is a 79 y.o. male contacted today regarding initial fill of specialty medication(s) Pirtobrutinib  (JAYPIRCA )   Patient requested Delivery   Delivery date: 02/24/24   Verified address: [address] [place] [address]   Medication will be filled on: 02/23/24   Patient is aware of $0 copayment. Bill grant secondary   [place] (Patty) Chet Burnet, CPhT  [facility], [place] [place], [facility] - Oral Chemotherapy Patient Advocate Specialist III Phone: [phone]  Fax: [phone]

## 2024-02-22 NOTE — Telephone Encounter (Signed)
 Spoke with Grayce RN at patient's PCP office.  She reports that they are okay with dose reducing the patient to 20 mg of rosuvastatin  and that they sent the prescription to the patient's pharmacy with a new dose.  They have had a hard time reaching the patient to discuss the dose change.  Will make sure patient is aware of the new dose from our office before starting pirtobrutinib .

## 2024-02-23 ENCOUNTER — Other Ambulatory Visit: Payer: Self-pay | Admitting: Family Medicine

## 2024-02-23 ENCOUNTER — Ambulatory Visit
Admission: RE | Admit: 2024-02-23 | Discharge: 2024-02-23 | Disposition: A | Discharge status: Home / Self Care | Source: Ambulatory Visit | Attending: Family Medicine | Admitting: Family Medicine

## 2024-02-23 ENCOUNTER — Other Ambulatory Visit: Payer: Self-pay

## 2024-02-23 DIAGNOSIS — R1031 Right lower quadrant pain: Secondary | ICD-10-CM | POA: Insufficient documentation

## 2024-02-24 ENCOUNTER — Telehealth: Payer: Self-pay

## 2024-02-24 DIAGNOSIS — C9110 Chronic lymphocytic leukemia of B-cell type not having achieved remission: Secondary | ICD-10-CM

## 2024-02-24 NOTE — Telephone Encounter (Signed)
 Please schedule lab/MD in approx 2 weeks. Ok to adjust venofer  to be same day as he see's MD. Please notify pt of appts.

## 2024-02-24 NOTE — Telephone Encounter (Signed)
-----   Message from Alyson N Leonard sent at 02/22/2024 11:29 AM EST ----- It took us  a while, but we are finally able to reach this patient to get him set up for pirtobrutinib  medication delivery from [facility].  Spoke with his wife and she knows that he is not to start the medication until Dr. Babara gives him to go ahead.  Almarie, I saw your note about trying to reach them about stopping the Zanubrutinib  and I relayed that message to them when I spoke to his wife.  Dr. Babara, I spoke to the primary care office about the interaction with rosuvastatin  and they were okay with dose reducing the patient to rosuvastatin  20 mg.  They sent the prescription for the new dose to patient's local pharmacy.  Discussed this dose reduction with the patient's wife today too. ----- Message ----- From: Babara Call, MD Sent: 02/13/2024  11:04 PM EST To: Almarie JINNY Nett, RN; Fonda KANDICE Sax, CMA#  [place],  I plan to switch him from Zanubrutinib  to Pirtubrutinib 200mg  daily due to disease progression. Please check insurance coverage. His hemoglobin is low and I plan to add additional blood work and possible repeat next week. Please advise him not to start until further notice. Thanks.   Almarie and Elrama,  Please arrange him to repeat CT chest wo contrast. - CLL.  Advise him to stop Zanubrutinib .  Add iron  tibc ferritin.  Follow up TBD  Thanks.

## 2024-02-29 ENCOUNTER — Other Ambulatory Visit: Payer: Self-pay

## 2024-02-29 ENCOUNTER — Ambulatory Visit: Admitting: Nurse Practitioner

## 2024-02-29 ENCOUNTER — Telehealth: Payer: Self-pay | Admitting: *Deleted

## 2024-02-29 ENCOUNTER — Encounter: Payer: Self-pay | Admitting: Family Medicine

## 2024-02-29 DIAGNOSIS — Z91199 Patient's noncompliance with other medical treatment and regimen due to unspecified reason: Secondary | ICD-10-CM

## 2024-02-29 DIAGNOSIS — G894 Chronic pain syndrome: Secondary | ICD-10-CM

## 2024-02-29 NOTE — Telephone Encounter (Signed)
 Incoming call from Robin with [facility] internal medicine [employer] office.  Patient had an u/s R groin  done on 1/29 to r/o hernia. Report shows Right inguinal lymphadenopathy. [facility] Internal med calling to share report with Dr. Babara to see if this correlates with the patient's CLL dx. (See report in epic)

## 2024-02-29 NOTE — Telephone Encounter (Signed)
 Dr. Babara will review results.

## 2024-03-01 ENCOUNTER — Inpatient Hospital Stay: Attending: Oncology

## 2024-03-01 ENCOUNTER — Ambulatory Visit: Admitting: Nurse Practitioner

## 2024-03-01 ENCOUNTER — Encounter: Payer: Self-pay | Admitting: Nurse Practitioner

## 2024-03-01 VITALS — BP 139/69 | HR 73 | Temp 98.4°F | Resp 20 | Ht 73.0 in | Wt 214.0 lb

## 2024-03-01 VITALS — BP 138/65 | HR 72 | Temp 97.2°F | Resp 18

## 2024-03-01 DIAGNOSIS — C9110 Chronic lymphocytic leukemia of B-cell type not having achieved remission: Secondary | ICD-10-CM

## 2024-03-01 DIAGNOSIS — M19011 Primary osteoarthritis, right shoulder: Secondary | ICD-10-CM

## 2024-03-01 DIAGNOSIS — M12812 Other specific arthropathies, not elsewhere classified, left shoulder: Secondary | ICD-10-CM

## 2024-03-01 DIAGNOSIS — Z79891 Long term (current) use of opiate analgesic: Secondary | ICD-10-CM

## 2024-03-01 DIAGNOSIS — G894 Chronic pain syndrome: Secondary | ICD-10-CM

## 2024-03-01 DIAGNOSIS — Z79899 Other long term (current) drug therapy: Secondary | ICD-10-CM

## 2024-03-01 DIAGNOSIS — G8929 Other chronic pain: Secondary | ICD-10-CM

## 2024-03-01 MED ORDER — OXYCODONE-ACETAMINOPHEN 10-325 MG PO TABS: 1.0000 | ORAL_TABLET | Freq: Three times a day (TID) | ORAL | 0 refills | Status: AC | PRN

## 2024-03-01 MED ORDER — IRON SUCROSE 20 MG/ML IV SOLN
200.0000 mg | Freq: Once | INTRAVENOUS | Status: AC
  Administered 2024-03-01: 200 mg via INTRAVENOUS

## 2024-03-01 NOTE — Patient Instructions (Signed)
 A Shot to Block Pain in Your Shoulder or Upper Arm (Interscalene Nerve Block): What to Expect  An interscalene nerve block is a shot of numbing medicine that's delivered into a group of nerves in the neck called the brachial plexus. These nerves control feeling and movement in the shoulder and upper arm. This procedure may be done to: Numb your shoulder and upper arm during surgery. Help relieve pain in your shoulder or upper arm. The interscalene nerve block may be given as a single shot or as a continuous block. For a continuous block, a soft tube (catheter) is used to deliver numbing medicine to the nerves over a longer period. Tell a health care provider about: Any allergies you have. All medicines you take. These include vitamins, herbs, eye drops, and creams. Any surgeries you have had. Any anesthesia you have had. Any problems you or family members have had with anesthesia. Any medical conditions you have, including any lung disease or bleeding problems. Whether you're pregnant or may be pregnant. Whether you smoke, drink alcohol, or use drugs, such as marijuana. What are the risks? Your health care provider will talk with you about risks. These may include: Damage to nearby structures or organs. These include damage to nerves, blood vessels, other neck structures, or lungs. Lung damage can cause lung collapse (pneumothorax). Bleeding. Infection. Numbness, tingling, or weakness of the shoulder or arm. Numbing of other nerves near the brachial plexus. This may cause hoarseness, eye droop, dry eye, eye redness, shortness of breath, or chest tightness. Allergies to medicines. Other reactions to medicines. These may cause seizures or heart problems. What happens before? When to stop eating and drinking Eat and drink only as you've been told. If you don't, your interscalene nerve block procedure may be delayed or canceled. You may be told this: 8 hours before: Stop eating most foods.  Do not eat meat, fried foods, or fatty foods. Eat only light foods such as toast or crackers. All liquids are OK except energy drinks and alcohol. 6 hours before: Stop eating. Drink only clear liquids, such as water, clear fruit juice, black coffee, plain tea, and sports drinks. Do not drink energy drinks or alcohol. 2 hours before: Stop drinking all liquids. You may be allowed to take medicines with small sips of water. Medicines Ask about changing or stopping: Any medicines you take. Any vitamins, herbs, or supplements you take. Do not take aspirin  or ibuprofen  unless you're told to. Surgery safety For your safety, you may: Need to wash your skin with a soap that kills germs. Get antibiotics. Have hair removed at the surgery site. General instructions Do not smoke, vape, or use nicotine or tobacco for 3-6 weeks before your surgery. Ask if you'll be staying overnight in the hospital. If you'll be going home right after the surgery, plan to have a responsible adult: Drive you home from the hospital or clinic. You won't be allowed to drive. Stay with you for the time you're told. What happens during an interscalene nerve block? An IV will be inserted into one of your veins, usually in the arm on the side that will not receive the block. You may be given: A sedative to help you relax. Anesthesia to keep you from feeling pain. Analgesic to help reduce pain. You'll rest on your back. Your provider will find the muscles over your brachial plexus. You'll be given a shot on your neck to numb the skin where the block will be placed (local anesthetic). A  needle will be inserted through your skin, toward the brachial plexus. Your shoulder or arm may twitch. The block will be put in as follows: If you're having a single-shot nerve block, the shot will be given. The needle that was used for the shot will be removed. If you are having a continuous block, a catheter may be placed to deliver  medicine to the nerves nonstop, often for 1-2 days. A clear bandage may be placed over the area where the shot was given. For a continuous block, more dressings may be added to keep the tubing in place. These steps may vary. Ask what you can expect. What happens after? You'll be watched closely. The effect of the block will be checked. Your shoulder and upper arm should be numb. Your fingers may tingle and may feel numb. If you have a surgery right after you have the block, these things may happen after the surgery: You'll be watched closely until you leave. This includes checking your pain level, blood pressure, heart rate, and breathing rate. Your arm may remain numb for several hours. If you have a continuous block catheter in place, your arm may remain numb for longer. You may have to wear a sling to: Keep your arm safe from injury while it's numb. Support your arm after surgery. As the nerve block medicine wears off: You may begin to feel pain at the surgery site. You may be given pain medicine for this. You may have tingling in your shoulder, arm, and fingers. Your muscle strength and feeling will return within hours. Do not drive or use machines until you're told it's safe. This information is not intended to replace advice given to you by your health care provider. Make sure you discuss any questions you have with your health care provider. Document Revised: 12/10/2022 Document Reviewed: 12/10/2022 Elsevier Patient Education  2025 [employer].

## 2024-03-01 NOTE — Progress Notes (Signed)
 Nursing Pain Medication Assessment:  Safety precautions to be maintained throughout the outpatient stay will include: orient to surroundings, keep bed in low position, maintain call bell within reach at all times, provide assistance with transfer out of bed and ambulation.  Medication Inspection Compliance: Pill count conducted under aseptic conditions, in front of the patient. Neither the pills nor the bottle was removed from the patient's sight at any time. Once count was completed pills were immediately returned to the patient in their original bottle.  Medication: Oxycodone /APAP Pill/Patch Count: 3 of 90 pills/patches remain Pill/Patch Appearance: Markings consistent with prescribed medication Bottle Appearance: Standard pharmacy container. Clearly labeled. Filled Date: 69 / 60 / 2025 Last Medication intake:  Yesterday  Patient grabbed the wrong bottle, he had filled last month. He carries what he needs in a separate bottle to work.

## 2024-03-01 NOTE — Progress Notes (Signed)
 PROVIDER NOTE: Interpretation of information contained herein should be left to medically-trained personnel. Specific patient instructions are provided elsewhere under Patient Instructions section of medical record. This document was created in part using AI and STT-dictation technology, any transcriptional errors that may result from this process are unintentional.  Patient: Paul Nadara Browner [name]  Service: E/M   PCP: Cyrus Selinda Moose, PA-C  DOB: [birth year]  DOS: 03/01/2024  Provider: Emmy MARLA Blanch, NP  MRN: [id number]  Delivery: Face-to-face  Specialty: Interventional Pain Management  Type: Established Patient  Setting: Ambulatory outpatient facility  Specialty designation: 09  Referring Prov.: Cyrus, Selinda Moose, PA-C  Location: Outpatient office facility       History of present illness (HPI) Mr. Chirstopher Iovino [name], a 79 y.o. year old male, is here today because of his Chronic bilateral shoulder pain [M25.511, G89.29]. Mr. Aston primary complain today is Bilateral shoulder Pain, Hip Pain, Back Pain, and Arm Pain.  Pertinent problems: Mr. Weniger has Chronic back pain; CKD (chronic kidney disease) stage 3, GFR 30-59 ml/min (HCC); Spinal stenosis, lumbar region, with neurogenic claudication; Chronic left shoulder pain; Chronic pain syndrome; Pain management contract signed; Chronic radicular lumbar pain; Lumbar facet arthropathy; Lumbar degenerative disc disease; Encounter for long-term opiate analgesic use; Class 1 obesity without serious comorbidity with body mass index (BMI) of 30.0 to 30.9 in adult; Right ankle pain; Type II diabetes mellitus (HCC); HTN (hypertension); Right knee pain; CLL (chronic lymphocytic leukemia) (HCC); Encounter for antineoplastic chemotherapy; Chronic right shoulder pain; and COPD (chronic obstructive pulmonary disease) (HCC) on their pertinent problem list.  Pain Assessment: Severity of Chronic pain is reported as a 5 /10. Location: Generalized  Lower/Denies. Onset: More than a month ago. Quality: Sore. Timing: Constant. Modifying factor(s): Pain Medication. Vitals:  height is 6' 1 (1.854 m) and weight is 214 lb (97.1 kg). His temporal temperature is 98.4 F (36.9 C). His blood pressure is 139/69 and his pulse is 73. His respiration is 20 and oxygen  saturation is 100%.  BMI: Estimated body mass index is 28.23 kg/m as calculated from the following:   Height as of this encounter: 6' 1 (1.854 m).   Weight as of this encounter: 214 lb (97.1 kg).  Last encounter: 02/29/2024. Last procedure: Visit date not found.  Reason for encounter: evaluation for possible interventional PM therapy/treatment and medication management. No change in medical history since last visit.  Patient's pain is at baseline.  Patient continues multimodal pain regimen as prescribed.  States that it provides pain relief and improvement in functional status.   Discussed the use of AI scribe software for clinical note transcription with the patient, who gave verbal consent to proceed.  History of Present Illness   Paul Mclure [name] is a 79 year old male who presents with hip and low back pain following a fall and bilateral shoulder pain.  Approximately two to three weeks ago, he experienced a fall at a [employer] store, tripping over an object. Since then, he has been experiencing hip, low back pain, and bilateral shoulder pain. He is currently on pain medication, which significantly alleviates his symptoms. No adverse reactions to the medication are reported.  He has a history of a torn muscle in his right shoulder, initially caused by being hit by a concrete truck. He believes the recent fall may have aggravated this previous injury. Both shoulders are painful. An x-ray has been done for the right shoulder, but he is unsure about any MRI or specific procedures. He  has not had any injections or procedures for his shoulder pain, although he recalls having a cervical  epidural for neck issues in the past.  No adverse reactions to his current medications.     Pharmacotherapy Assessment   Oxycodone -acetaminophen  (Percocet) 10-325 mg tablet every 8 hours as needed for pain. MME=45 Monitoring: [place] PMP: PDMP reviewed during this encounter.       Pharmacotherapy: No side-effects or adverse reactions reported. Compliance: No problems identified. Effectiveness: Clinically acceptable.  Erlene Doyal [place], [place]  03/01/2024  2:01 PM  Sign when Signing Visit Nursing Pain Medication Assessment:  Safety precautions to be maintained throughout the outpatient stay will include: orient to surroundings, keep bed in low position, maintain call bell within reach at all times, provide assistance with transfer out of bed and ambulation.  Medication Inspection Compliance: Pill count conducted under aseptic conditions, in front of the patient. Neither the pills nor the bottle was removed from the patient's sight at any time. Once count was completed pills were immediately returned to the patient in their original bottle.  Medication: Oxycodone /APAP Pill/Patch Count: 3 of 90 pills/patches remain Pill/Patch Appearance: Markings consistent with prescribed medication Bottle Appearance: Standard pharmacy container. Clearly labeled. Filled Date: 63 / 10 / 2025 Last Medication intake:  Yesterday  Patient grabbed the wrong bottle, he had filled last month. He carries what he needs in a separate bottle to work.    UDS:  Summary  Date Value Ref Range Status  12/05/2023 FINAL  Final    Comment:    ==================================================================== [employer] Select 13 (MW) ==================================================================== Test                             Result       Flag       Units  Drug Present and Declared for Prescription Verification   Oxycodone                       1192         EXPECTED   ng/mg creat   Oxymorphone                    473           EXPECTED   ng/mg creat   Noroxycodone                   2183         EXPECTED   ng/mg creat   Noroxymorphone                 292          EXPECTED   ng/mg creat    Sources of oxycodone  are scheduled prescription medications.    Oxymorphone, noroxycodone, and noroxymorphone are expected    metabolites of oxycodone . Oxymorphone is also available as a    scheduled prescription medication.  ==================================================================== Test                      Result    Flag   Units      Ref Range   Creatinine              88               mg/dL      >=79 ==================================================================== Declared Medications:  The flagging and interpretation on this report are based on the  following declared  medications.  Unexpected results may arise from  inaccuracies in the declared medications.   **Note: The testing scope of this panel includes these medications:   Oxycodone  (Percocet)   **Note: The testing scope of this panel does not include the  following reported medications:   Acetaminophen  (Tylenol )  Acetaminophen  (Percocet)  Albuterol  (Ventolin  HFA)  Aspirin   Budesonide  (Breztri  Aerosphere)  Doxylamine  Empagliflozin  (Jardiance )  Formoterol  (Breztri  Aerosphere)  Furosemide  (Lasix )  Glycopyrrolate  (Breztri  Aerosphere)  Hydralazine  (Apresoline )  Levothyroxine  (Synthroid )  Metformin   Metoprolol  (Toprol )  Mirtazapine  (Remeron )  Naloxone  (Narcan )  Prednisone  (Deltasone )  Rosuvastatin  (Crestor )  Sacubitril  (Entresto )  Spironolactone  (Aldactone )  Valsartan  (Entresto )  Zanubrutinib  (Brukinsa ) ==================================================================== For clinical consultation, please call [phone]. ====================================================================     No results found for: CBDTHCR No results found for: D8THCCBX No results found for: D9THCCBX  ROS  Constitutional: Denies any  fever or chills Gastrointestinal: No reported hemesis, hematochezia, vomiting, or acute GI distress Musculoskeletal: bilateral shoulder pain, low back pain, hip pain, and arm pain  Neurological: No reported episodes of acute onset apraxia, aphasia, dysarthria, agnosia, amnesia, paralysis, loss of coordination, or loss of consciousness  Medication Review  DM-Doxylamine-Acetaminophen , acetaminophen , albuterol , aspirin  EC, cyclobenzaprine , empagliflozin , furosemide , hydrALAZINE , levothyroxine , metFORMIN , metoprolol  succinate, mirtazapine , naloxone , oxyCODONE -acetaminophen , pirtobrutinib , predniSONE , rosuvastatin , spironolactone , and torsemide  History Review  Allergy: Mr. Mayall has no known allergies. Drug: Mr. Whiteford  reports no history of drug use. Alcohol:  reports no history of alcohol use. Tobacco:  reports that he has quit smoking. His smoking use included cigarettes. He has been exposed to tobacco smoke. He has never used smokeless tobacco. Social: Mr. Durrell  reports that he has quit smoking. His smoking use included cigarettes. He has been exposed to tobacco smoke. He has never used smokeless tobacco. He reports that he does not drink alcohol and does not use drugs. 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), DDD (degenerative disc disease), cervical, Difficult airway (10/22/2015), Diverticulosis, DM (diabetes mellitus), type 2 (HCC), Dysplasia of prostate, Erectile dysfunction, Frequent falls, Heart failure with mildly reduced ejection fraction (HFmrEF) (HCC), 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 Surgical: Mr. Guastella  has a past surgical history that includes Back surgery; Tonsillectomy; Esophagogastroduodenoscopy (egd) with propofol  (N/A, 08/21/2022); biopsy (08/21/2022); Colonoscopy with propofol  (N/A, 08/23/2022); Givens capsule study (N/A, 08/23/2022); polypectomy (08/23/2022); Submucosal tattoo injection (08/23/2022); biopsy (08/23/2022); Irrigation and debridement abscess (Left, 08/28/2022); Solmon hole of cranium (Left, 10/22/2015); Laparoscopic right colectomy (Right, 09/28/2022); Ventral hernia repair (N/A, 09/28/2022); Colon surgery (09/28/2022); Colonoscopy with propofol  (N/A, 02/28/2023); Esophagogastroduodenoscopy (egd) with propofol  (N/A, 02/28/2023); biopsy (02/28/2023); Inguinal hernia repair (Right, 03/09/2023); Hernia repair; Colonoscopy with propofol  (N/A, 04/25/2023); polypectomy (04/25/2023); IR US  Guide Bx Asp/Drain (06/27/2023); and IR BONE MARROW BIOPSY & ASPIRATION (08/19/2023). Family: family history includes ADD / ADHD in his daughter; Asthma in his son;  Cancer in his maternal aunt.  Laboratory Chemistry Profile   Renal Lab Results  Component Value Date   BUN 19 02/13/2024   CREATININE 1.29 (H) 02/13/2024   BCR 11 12/22/2022   GFRAA >60 10/22/2015   GFRNONAA 57 (L) 02/13/2024    Hepatic Lab Results  Component Value Date   AST 42 (H) 02/13/2024   ALT 26 02/13/2024   ALBUMIN 4.3 02/13/2024   ALKPHOS 77 02/13/2024    HCVAB NON REACTIVE 11/01/2023   LIPASE 35 11/12/2022    Electrolytes Lab Results  Component Value Date   NA 143 02/13/2024   K 4.0 02/13/2024   CL 108 02/13/2024   CALCIUM  9.1 02/13/2024   MG 2.9 (H) 11/28/2023   PHOS 2.2 (L) 11/23/2023    Bone Lab Results  Component Value Date   VD25OH 37.52 11/22/2023   TESTOSTERONE  368 11/24/2022    Inflammation (CRP: Acute Phase) (ESR: Chronic Phase) Lab Results  Component Value Date   CRP 0.7 11/26/2023   ESRSEDRATE 37 (H) 06/23/2023   LATICACIDVEN 1.0 10/01/2023         Note: Above Lab results reviewed.  Recent Imaging Review   Narrative & Impression  CLINICAL DATA:  Chronic left shoulder pain, worsening after falling 2 weeks ago. Limited range of motion.   EXAM: MR SHOULDER*L* W/O CM   TECHNIQUE: Multiplanar, multisequence MR imaging of the shoulder was performed. No intravenous contrast was administered.   COMPARISON:  None recent.  Left shoulder radiographs 09/17/2009.   FINDINGS: Rotator cuff: There is a massive full-thickness rotator cuff tear. There are complete insertional tears of the supraspinatus and infraspinatus tendons which are retracted to the level of the glenoid. There is also full-thickness, incomplete tearing of the subscapularis tendon. Intact teres minor tendon.   Muscles: Moderate atrophy throughout the supraspinatus, infraspinatus and subscapularis muscles. No significant muscular edema identified.   Biceps long head: Medially dislocated from the superior aspect of the bicipital groove. There is partial tearing of the intra-articular portion.   Acromioclavicular Joint: The acromion is type 2. There are moderate to advanced acromioclavicular degenerative changes with osteophytes and subchondral cyst formation. A small amount of fluid in the subacromial-subdeltoid and subcoracoid bursa communicates with the shoulder joint via the full-thickness rotator cuff tear.   Glenohumeral Joint:  Moderate glenohumeral degenerative changes with a small shoulder joint effusion. The humeral head is high-riding with obliteration of the subacromial space.   Labrum: Degenerative tearing of the labrum superiorly and posteriorly.   Bones: As above, high-riding humeral head. There is a focal cortical defect superiorly in the humeral head without surrounding marrow edema, measuring 8 x 5 mm on coronal image 16/10. This is suspicious for an old osteochondral injury. No evidence of acute fracture, dislocation or humeral head osteonecrosis. Suspected unfused os acromiale.   Other: Left axillary adenopathy noted, attributed to history of CLL. See subsequent chest CT report.   IMPRESSION: 1. Massive full-thickness rotator cuff tear as described. There are complete insertional tears of the supraspinatus and infraspinatus tendons and full-thickness, and incomplete tearing of the subscapularis tendon. There is associated moderate muscular atrophy. 2. Medial dislocation of the biceps tendon from the superior aspect of the bicipital groove with partial tearing of the intra-articular portion. 3. Moderate glenohumeral and acromioclavicular degenerative changes. Suspected old osteochondral injury of the humeral head superiorly. 4. Left axillary adenopathy attributed to history of CLL.     Electronically Signed   By: Elsie Perone M.D.   On: 02/22/2024 09:26  Narrative & Impression  EXAM: 1 VIEW(S) XRAY OF THE RIGHT SHOULDER 02/12/2024 04:02:40 AM   COMPARISON: Right shoulder series dated 07/08/2023.   CLINICAL HISTORY: fall   FINDINGS:   BONES AND JOINTS: Glenohumeral joint is normally aligned. No acute fracture. No malalignment. There is loss of the acromiohumeral interval. Widening of the acromioclavicular joint which also demonstrates moderate spurring. 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 There is also a smooth erosion of the medial junction of the  humeral head and neck.   SOFT TISSUES: No abnormal calcifications. Visualized lung is unremarkable.   IMPRESSION: 1. No acute fracture or dislocation. 2. Findings compatible with chronic rotator cuff disease, including loss of the acromiohumeral interval, widening of the acromioclavicular joint with moderate spurring, and smooth erosion at the medial humeral head-neck junction.   Electronically signed by: Evalene Coho MD 02/12/2024 04:34 AM EST RP Workstation: [id number]   Physical Exam  Vitals: BP 139/69 (BP Location: Left Arm, Patient Position: Sitting, Cuff Size: Normal)   Pulse 73   Temp 98.4 F (36.9 C) (Temporal)   Resp 20   Ht 6' 1 (1.854 m)   Wt 214 lb (97.1 kg)   SpO2 100%   BMI 28.23 kg/m  BMI: Estimated body mass index is 28.23 kg/m as calculated from the following:   Height as of this encounter: 6' 1 (1.854 m).   Weight as of this encounter: 214 lb (97.1 kg). Ideal: Ideal body weight: 79.9 kg (176 lb 2.4 oz) Adjusted ideal body weight: 86.8 kg (191 lb 4.6 oz) General appearance: Well nourished, well developed, and well hydrated. In no apparent acute distress Mental status: Alert, oriented x 3 (person, place, & time)       Respiratory: No evidence of acute respiratory distress Eyes: PERLA  Upper Extremity (UE) Exam      Right  Left  Inspection    Skin color, temperature, and hair growth are WNL. No peripheral edema or cyanosis. No masses, redness, swelling, asymmetry, or associated skin lesions. No contractures.  Skin color, temperature, and hair growth are WNL. No peripheral edema or cyanosis. No masses, redness, swelling, asymmetry, or associated skin lesions. No contractures.          Functional ROM    Pain restricted ROM for shoulder  Pain restricted ROM for shoulder          Muscle Tone/Strength    Functionally intact. No obvious neuro-muscular anomalies detected.  Functionally intact. No obvious neuro-muscular anomalies detected.          Sensory  (Neurological)    Musculoskeletal pain pattern          Musculoskeletal pain pattern                  Palpation    No palpable anomalies              No palpable anomalies                      Maneuver Shoulder abduction (deltoid/supraspinatus, axillary/supra scapular n,, C5) Elbow flexion (biceps brachial, musculoskeletal n, C5-6) Elbow extension (triceps, radial n, C7) Finger abduction (interossei, ulnar n, T1)    Shoulder abduction (deltoid/supraspinatus, axillary/supra scapular n,, C5) Elbow flexion (biceps brachial, musculoskeletal n, C5-6) Elbow extension (triceps, radial n, C7) Wrist extensors (C6) Finger extensors (C8) Finger abduction (interossei, ulnar n, T1)            Provocative Test    Phalen's test: deferred Tinel's  test: deferred Apley's scratch test (touch opposite shoulder):  Action 1 (Across chest): Decreased ROM Action 2 (Overhead): Decreased ROM Action 3 (LB reach): Decreased ROM  Phalen's test: deferred Tinel's test: deferred Apley's scratch test (touch opposite shoulder):  Action 1 (Across chest): Decreased ROM Action 2 (Overhead): Decreased ROM Action 3 (LB reach): Decreased ROM             Level  Myotome  Dermatome  Sclerotome  ROM  C5  Elbow flexion  Lateral upper arm      C6  Wrist extension  Thumb and index      C7  Elbow extension  Middle finger      C8  Finger extension  Ring and pinky finger      T1  Finger abduction  Medial elbow and axilla                                                                                                                                         Assessment   Diagnosis Status  1. Chronic right shoulder pain   2. Primary osteoarthritis of right shoulder   3. Left rotator cuff tear arthropathy   4. Chronic pain syndrome   5. Chronic left shoulder pain   6. Medication management   7. Chronic use of opiate for therapeutic purpose   8. Encounter for medication management   9. Encounter for chronic pain  management    Persistent Persistent Persistent   Updated Problems: No problems updated.  Plan of Care  Problem-specific:  Assessment and Plan    Chronic pain syndrome Managed with medication, effectively alleviating hip and low back pain.  Right shoulder rotator cuff tear and osteoarthritis Pain persists, interested in further intervention. - Scheduled shoulder injection with Dr. Lateef for next week.  Left rotator cuff tear arthropathy Degenerative changes and arthritis. Pain management ongoing, interested in further intervention. - Scheduled shoulder injection with Dr. Lateef for next week.   Medication management: Patient's pain is controlled with oxycodone , will continue on current medication regimen.  Prescribing drug monitoring (PMP) reviewed, consistent with the use of prescribed medication and no evidence of narcotic misuse or abuse. Routine UDS ordered today.  No side effects or any adverse reaction reported to medication.  The patient was emphasized compliance with medication regimen.  Schedule follow-up in 90 days for medication management.     Plan: (Clinic): (B/L) Shoulder (Glenohumeral) joint and (AC) Joint Injection with Dr. Marcelino   Mr. Paul Nadara Browner [name] has a current medication list which includes the following long-term medication(s): albuterol , furosemide , hydralazine , levothyroxine , levothyroxine , metformin , metoprolol  succinate, mirtazapine , rosuvastatin , spironolactone , and torsemide.  Pharmacotherapy (Medications Ordered): Meds ordered this encounter  Medications   oxyCODONE -acetaminophen  (PERCOCET) 10-325 MG tablet    Sig: Take 1 tablet by mouth every 8 (eight) hours as needed for pain. Must last 30 days.    Dispense:  90 tablet  Refill:  0    Chronic Pain: STOP Act (Not applicable) Fill 1 day early if closed on refill date. Avoid benzodiazepines within 8 hours of opioids   oxyCODONE -acetaminophen  (PERCOCET) 10-325 MG tablet    Sig: Take 1  tablet by mouth every 8 (eight) hours as needed for pain. Must last 30 days.    Dispense:  90 tablet    Refill:  0    Chronic Pain: STOP Act (Not applicable) Fill 1 day early if closed on refill date. Avoid benzodiazepines within 8 hours of opioids   Orders:  Orders Placed This Encounter  Procedures   SHOULDER INJECTION    Standing Status:   Future    Expiration Date:   05/29/2024    Scheduling Instructions:     Procedure: Intra-articular shoulder (Glenohumeral) joint and (AC) Acromioclavicular joint injection     Side: Bilateral     Level: Glenohumeral joint and (AC) Acromioclavicular joint     Sedation: Patient's choice.     Timeframe: As permitted by the schedule    Where will this procedure be performed?:   [facility]       Return in about 2 weeks (around 03/15/2024) for [facility]): (B/L) Shoulder (Glenohumeral) joint and (AC) Joint Injection with Dr. Marcelino .    Recent Visits Date Type Provider Dept  02/29/24 Office Visit Leni Pankonin K, NP [facility]  12/05/23 Office Visit Danise Dehne K, NP [facility]  Showing recent visits within past 90 days and meeting all other requirements Today's Visits Date Type Provider Dept  03/01/24 Office Visit Yvaine Jankowiak K, NP [facility]  Showing today's visits and meeting all other requirements Future Appointments Date Type Provider Dept  05/28/24 Appointment Maeby Vankleeck K, NP [facility]  Showing future appointments within next 90 days and meeting all other requirements  I discussed the assessment and treatment plan with the patient. The patient was provided an opportunity to ask questions and all were answered. The patient agreed with the plan and demonstrated an understanding of the instructions.  Patient advised to call back or seek an in-person evaluation if the symptoms or condition worsens. I personally spent a total of 30 minutes in the care of the patient today including preparing  to see the patient, getting/reviewing separately obtained history, performing a medically appropriate exam/evaluation, counseling and educating, placing orders, referring and communicating with other health care professionals, documenting clinical information in the EHR, independently interpreting results, communicating results, and coordinating care.   Note by: Graclyn Lawther K Endya Austin, NP (TTS and AI technology used. I apologize for any typographical errors that were not detected and corrected.) Date: 03/01/2024; Time: 2:54 PM

## 2024-03-01 NOTE — Patient Instructions (Signed)
 Iron  Sucrose Injection What is this medication? IRON  SUCROSE (EYE ern SOO krose) treats low levels of iron  (iron  deficiency anemia) in people with kidney disease. Iron  is a mineral that plays an important role in making red blood cells, which carry oxygen from your lungs to the rest of your body. This medicine may be used for other purposes; ask your health care provider or pharmacist if you have questions. COMMON BRAND NAME(S): Venofer  What should I tell my care team before I take this medication? They need to know if you have any of these conditions: Anemia not caused by low iron  levels Heart disease High levels of iron  in the blood Kidney disease Liver disease An unusual or allergic reaction to iron , other medications, foods, dyes, or preservatives Pregnant or trying to get pregnant Breastfeeding How should I use this medication? This medication is infused into a vein. It is given by your care team in a hospital or clinic setting. Talk to your care team about the use of this medication in children. While it may be prescribed for children as young as 2 years for selected conditions, precautions do apply. Overdosage: If you think you have taken too much of this medicine contact a poison control center or emergency room at once. NOTE: This medicine is only for you. Do not share this medicine with others. What if I miss a dose? Keep appointments for follow-up doses. It is important not to miss your dose. Call your care team if you are unable to keep an appointment. What may interact with this medication? Do not take this medication with any of the following: Deferoxamine Dimercaprol Other iron  products This medication may also interact with the following: Chloramphenicol Deferasirox This list may not describe all possible interactions. Give your health care provider a list of all the medicines, herbs, non-prescription drugs, or dietary supplements you use. Also tell them if you smoke,  drink alcohol, or use illegal drugs. Some items may interact with your medicine. What should I watch for while using this medication? Your condition will be monitored carefully while you are receiving this medication. Tell your care team if your symptoms do not start to get better or if they get worse. You may need blood work done while you are taking this medication. Sometimes, when medications are infused into veins, a little can leak out of the vein and into the tissue around it. If this medication leaks, it can cause a brown or dark stain on the skin. This is not common. It may be permanent. If you feel pain or swelling during your infusion, tell your care team right away. They can stop the infusion and treat the area. You may need to eat more foods that contain iron . Talk to your care team. Foods that contain iron  include whole grains or cereals, dried fruits, beans, peas, leafy green vegetables, and organ meats (liver, kidney). What side effects may I notice from receiving this medication? Side effects that you should report to your care team as soon as possible: Allergic reactions--skin rash, itching, hives, swelling of the face, lips, tongue, or throat Low blood pressure--dizziness, feeling faint or lightheaded, blurry vision Painful swelling, warmth, or redness of the skin, brown or dark skin color at the infusion site Shortness of breath Side effects that usually do not require medical attention (report these to your care team if they continue or are bothersome): Flushing Headache Joint pain Muscle pain Nausea This list may not describe all possible side effects. Call your  doctor for medical advice about side effects. You may report side effects to FDA at 1-800-FDA-1088. Where should I keep my medication? This medication is given in a hospital or clinic. It will not be stored at home. NOTE: This sheet is a summary. It may not cover all possible information. If you have questions about  this medicine, talk to your doctor, pharmacist, or health care provider.  2025 [employer] (2023-11-30 00:00:00)

## 2024-03-07 ENCOUNTER — Inpatient Hospital Stay

## 2024-03-07 ENCOUNTER — Inpatient Hospital Stay: Admitting: Oncology

## 2024-03-08 ENCOUNTER — Inpatient Hospital Stay

## 2024-03-15 ENCOUNTER — Inpatient Hospital Stay

## 2024-03-22 ENCOUNTER — Inpatient Hospital Stay

## 2024-05-28 ENCOUNTER — Encounter: Admitting: Nurse Practitioner
